# Patient Record
Sex: Female | Born: 1950
Health system: Southern US, Community
[De-identification: ages and names within clinical notes are randomized; demographics above are authoritative.]

## PROBLEM LIST (undated history)

## (undated) DIAGNOSIS — G4730 Sleep apnea, unspecified: Secondary | ICD-10-CM

## (undated) DIAGNOSIS — J383 Other diseases of vocal cords: Secondary | ICD-10-CM

## (undated) DIAGNOSIS — Z973 Presence of spectacles and contact lenses: Secondary | ICD-10-CM

## (undated) DIAGNOSIS — M1712 Unilateral primary osteoarthritis, left knee: Secondary | ICD-10-CM

## (undated) DIAGNOSIS — F329 Major depressive disorder, single episode, unspecified: Secondary | ICD-10-CM

## (undated) DIAGNOSIS — I428 Other cardiomyopathies: Secondary | ICD-10-CM

## (undated) DIAGNOSIS — K219 Gastro-esophageal reflux disease without esophagitis: Secondary | ICD-10-CM

## (undated) DIAGNOSIS — E785 Hyperlipidemia, unspecified: Secondary | ICD-10-CM

## (undated) DIAGNOSIS — T8859XA Other complications of anesthesia, initial encounter: Secondary | ICD-10-CM

## (undated) DIAGNOSIS — F32A Depression, unspecified: Secondary | ICD-10-CM

## (undated) DIAGNOSIS — I10 Essential (primary) hypertension: Secondary | ICD-10-CM

## (undated) DIAGNOSIS — Z8719 Personal history of other diseases of the digestive system: Secondary | ICD-10-CM

## (undated) DIAGNOSIS — I469 Cardiac arrest, cause unspecified: Secondary | ICD-10-CM

## (undated) DIAGNOSIS — Z87442 Personal history of urinary calculi: Secondary | ICD-10-CM

## (undated) DIAGNOSIS — M79609 Pain in unspecified limb: Secondary | ICD-10-CM

## (undated) DIAGNOSIS — D649 Anemia, unspecified: Secondary | ICD-10-CM

## (undated) DIAGNOSIS — Z95810 Presence of automatic (implantable) cardiac defibrillator: Secondary | ICD-10-CM

## (undated) DIAGNOSIS — I499 Cardiac arrhythmia, unspecified: Secondary | ICD-10-CM

## (undated) DIAGNOSIS — M1990 Unspecified osteoarthritis, unspecified site: Secondary | ICD-10-CM

## (undated) DIAGNOSIS — F419 Anxiety disorder, unspecified: Secondary | ICD-10-CM

## (undated) DIAGNOSIS — I509 Heart failure, unspecified: Secondary | ICD-10-CM

## (undated) DIAGNOSIS — I4891 Unspecified atrial fibrillation: Secondary | ICD-10-CM

## (undated) DIAGNOSIS — M1711 Unilateral primary osteoarthritis, right knee: Secondary | ICD-10-CM

## (undated) DIAGNOSIS — T4145XA Adverse effect of unspecified anesthetic, initial encounter: Secondary | ICD-10-CM

## (undated) DIAGNOSIS — J45909 Unspecified asthma, uncomplicated: Secondary | ICD-10-CM

## (undated) HISTORY — DX: Anemia, unspecified: D64.9

## (undated) HISTORY — PX: KNEE ARTHROSCOPY: SUR90

## (undated) HISTORY — DX: Other cardiomyopathies: I42.8

## (undated) HISTORY — DX: Heart failure, unspecified: I50.9

## (undated) HISTORY — DX: Presence of automatic (implantable) cardiac defibrillator: Z95.810

## (undated) HISTORY — DX: Cardiac arrest, cause unspecified: I46.9

## (undated) HISTORY — DX: Sleep apnea, unspecified: G47.30

## (undated) HISTORY — PX: CHOLECYSTECTOMY: SHX55

## (undated) HISTORY — PX: COLONOSCOPY: SHX174

## (undated) HISTORY — PX: OTHER SURGICAL HISTORY: SHX169

## (undated) HISTORY — DX: Hyperlipidemia, unspecified: E78.5

## (undated) HISTORY — DX: Other diseases of vocal cords: J38.3

## (undated) HISTORY — PX: TUBAL LIGATION: SHX77

## (undated) HISTORY — DX: Unspecified atrial fibrillation: I48.91

## (undated) HISTORY — DX: Gastro-esophageal reflux disease without esophagitis: K21.9

## (undated) HISTORY — DX: Anxiety disorder, unspecified: F41.9

## (undated) HISTORY — DX: Unspecified osteoarthritis, unspecified site: M19.90

## (undated) HISTORY — DX: Major depressive disorder, single episode, unspecified: F32.9

## (undated) HISTORY — DX: Unspecified asthma, uncomplicated: J45.909

## (undated) HISTORY — DX: Pain in unspecified limb: M79.609

## (undated) HISTORY — PX: BREAST LUMPECTOMY: SHX2

## (undated) HISTORY — PX: HAMMER TOE SURGERY: SHX385

## (undated) HISTORY — DX: Depression, unspecified: F32.A

---

## 1997-04-13 HISTORY — PX: BREAST EXCISIONAL BIOPSY: SUR124

## 1997-07-25 ENCOUNTER — Encounter: Admission: RE | Admit: 1997-07-25 | Discharge: 1997-07-25 | Payer: Self-pay | Admitting: Family Medicine

## 1997-08-08 ENCOUNTER — Encounter: Admission: RE | Admit: 1997-08-08 | Discharge: 1997-08-08 | Payer: Self-pay | Admitting: Family Medicine

## 1997-08-10 ENCOUNTER — Inpatient Hospital Stay (HOSPITAL_COMMUNITY): Admission: AD | Admit: 1997-08-10 | Discharge: 1997-08-13 | Payer: Self-pay | Admitting: Pulmonary Disease

## 1997-08-15 ENCOUNTER — Encounter: Admission: RE | Admit: 1997-08-15 | Discharge: 1997-08-15 | Payer: Self-pay | Admitting: Family Medicine

## 1997-08-22 ENCOUNTER — Encounter: Admission: RE | Admit: 1997-08-22 | Discharge: 1997-08-22 | Payer: Self-pay | Admitting: Family Medicine

## 1997-09-05 ENCOUNTER — Encounter: Admission: RE | Admit: 1997-09-05 | Discharge: 1997-09-05 | Payer: Self-pay | Admitting: Family Medicine

## 1997-09-19 ENCOUNTER — Encounter: Admission: RE | Admit: 1997-09-19 | Discharge: 1997-09-19 | Payer: Self-pay | Admitting: Family Medicine

## 1997-10-10 ENCOUNTER — Encounter: Admission: RE | Admit: 1997-10-10 | Discharge: 1997-10-10 | Payer: Self-pay | Admitting: Family Medicine

## 1997-10-25 ENCOUNTER — Encounter: Admission: RE | Admit: 1997-10-25 | Discharge: 1997-10-25 | Payer: Self-pay | Admitting: Family Medicine

## 1997-11-05 ENCOUNTER — Encounter: Admission: RE | Admit: 1997-11-05 | Discharge: 1997-11-05 | Payer: Self-pay | Admitting: Family Medicine

## 1997-12-06 ENCOUNTER — Encounter: Admission: RE | Admit: 1997-12-06 | Discharge: 1997-12-06 | Payer: Self-pay | Admitting: Family Medicine

## 1997-12-12 ENCOUNTER — Other Ambulatory Visit: Admission: RE | Admit: 1997-12-12 | Discharge: 1997-12-12 | Payer: Self-pay | Admitting: Family Medicine

## 1997-12-12 ENCOUNTER — Encounter: Admission: RE | Admit: 1997-12-12 | Discharge: 1997-12-12 | Payer: Self-pay | Admitting: Family Medicine

## 1997-12-18 ENCOUNTER — Encounter: Admission: RE | Admit: 1997-12-18 | Discharge: 1997-12-18 | Payer: Self-pay | Admitting: Sports Medicine

## 1998-02-25 ENCOUNTER — Encounter: Admission: RE | Admit: 1998-02-25 | Discharge: 1998-02-25 | Payer: Self-pay | Admitting: Family Medicine

## 1998-05-15 ENCOUNTER — Encounter: Admission: RE | Admit: 1998-05-15 | Discharge: 1998-05-15 | Payer: Self-pay | Admitting: Family Medicine

## 1998-05-19 ENCOUNTER — Emergency Department (HOSPITAL_COMMUNITY): Admission: EM | Admit: 1998-05-19 | Discharge: 1998-05-19 | Payer: Self-pay | Admitting: Emergency Medicine

## 1998-06-06 ENCOUNTER — Observation Stay (HOSPITAL_COMMUNITY): Admission: AD | Admit: 1998-06-06 | Discharge: 1998-06-07 | Payer: Self-pay | Admitting: Family Medicine

## 1998-06-06 ENCOUNTER — Encounter: Payer: Self-pay | Admitting: Family Medicine

## 1998-06-14 ENCOUNTER — Encounter: Admission: RE | Admit: 1998-06-14 | Discharge: 1998-06-14 | Payer: Self-pay | Admitting: Family Medicine

## 1998-07-01 ENCOUNTER — Encounter: Admission: RE | Admit: 1998-07-01 | Discharge: 1998-07-01 | Payer: Self-pay | Admitting: Sports Medicine

## 1998-07-17 ENCOUNTER — Encounter: Admission: RE | Admit: 1998-07-17 | Discharge: 1998-07-17 | Payer: Self-pay | Admitting: Family Medicine

## 1998-09-14 ENCOUNTER — Emergency Department (HOSPITAL_COMMUNITY): Admission: EM | Admit: 1998-09-14 | Discharge: 1998-09-14 | Payer: Self-pay | Admitting: Emergency Medicine

## 1998-09-26 ENCOUNTER — Encounter: Payer: Self-pay | Admitting: Infectious Diseases

## 1998-09-26 ENCOUNTER — Ambulatory Visit (HOSPITAL_COMMUNITY): Admission: RE | Admit: 1998-09-26 | Discharge: 1998-09-26 | Payer: Self-pay | Admitting: Infectious Diseases

## 1998-11-08 ENCOUNTER — Encounter: Admission: RE | Admit: 1998-11-08 | Discharge: 1998-11-08 | Payer: Self-pay | Admitting: Family Medicine

## 1998-11-22 ENCOUNTER — Encounter: Admission: RE | Admit: 1998-11-22 | Discharge: 1998-11-22 | Payer: Self-pay | Admitting: Family Medicine

## 1998-12-10 ENCOUNTER — Encounter: Admission: RE | Admit: 1998-12-10 | Discharge: 1998-12-10 | Payer: Self-pay | Admitting: Sports Medicine

## 1998-12-17 ENCOUNTER — Encounter: Admission: RE | Admit: 1998-12-17 | Discharge: 1998-12-17 | Payer: Self-pay | Admitting: Sports Medicine

## 1998-12-30 ENCOUNTER — Encounter: Admission: RE | Admit: 1998-12-30 | Discharge: 1998-12-30 | Payer: Self-pay | Admitting: Family Medicine

## 1999-01-02 ENCOUNTER — Encounter: Admission: RE | Admit: 1999-01-02 | Discharge: 1999-01-02 | Payer: Self-pay | Admitting: Family Medicine

## 1999-01-06 ENCOUNTER — Encounter: Admission: RE | Admit: 1999-01-06 | Discharge: 1999-01-06 | Payer: Self-pay | Admitting: Family Medicine

## 1999-01-24 ENCOUNTER — Encounter: Admission: RE | Admit: 1999-01-24 | Discharge: 1999-01-24 | Payer: Self-pay | Admitting: Family Medicine

## 1999-02-12 ENCOUNTER — Encounter: Admission: RE | Admit: 1999-02-12 | Discharge: 1999-02-12 | Payer: Self-pay | Admitting: Family Medicine

## 1999-03-03 ENCOUNTER — Ambulatory Visit (HOSPITAL_COMMUNITY): Admission: RE | Admit: 1999-03-03 | Discharge: 1999-03-03 | Payer: Self-pay | Admitting: Family Medicine

## 1999-03-28 ENCOUNTER — Encounter: Admission: RE | Admit: 1999-03-28 | Discharge: 1999-03-28 | Payer: Self-pay | Admitting: Family Medicine

## 1999-06-02 ENCOUNTER — Encounter: Admission: RE | Admit: 1999-06-02 | Discharge: 1999-06-02 | Payer: Self-pay | Admitting: Family Medicine

## 1999-06-10 ENCOUNTER — Encounter: Admission: RE | Admit: 1999-06-10 | Discharge: 1999-06-10 | Payer: Self-pay | Admitting: Sports Medicine

## 1999-06-16 ENCOUNTER — Encounter: Admission: RE | Admit: 1999-06-16 | Discharge: 1999-06-16 | Payer: Self-pay | Admitting: Family Medicine

## 1999-07-07 ENCOUNTER — Encounter: Admission: RE | Admit: 1999-07-07 | Discharge: 1999-07-07 | Payer: Self-pay | Admitting: Family Medicine

## 1999-08-14 ENCOUNTER — Encounter: Admission: RE | Admit: 1999-08-14 | Discharge: 1999-08-14 | Payer: Self-pay | Admitting: Family Medicine

## 1999-11-13 ENCOUNTER — Encounter: Admission: RE | Admit: 1999-11-13 | Discharge: 1999-11-13 | Payer: Self-pay | Admitting: Family Medicine

## 2000-01-16 ENCOUNTER — Encounter: Admission: RE | Admit: 2000-01-16 | Discharge: 2000-01-16 | Payer: Self-pay | Admitting: Family Medicine

## 2000-01-27 ENCOUNTER — Encounter: Admission: RE | Admit: 2000-01-27 | Discharge: 2000-01-27 | Payer: Self-pay | Admitting: Family Medicine

## 2000-02-05 ENCOUNTER — Encounter: Admission: RE | Admit: 2000-02-05 | Discharge: 2000-02-05 | Payer: Self-pay | Admitting: Family Medicine

## 2000-02-19 ENCOUNTER — Encounter: Admission: RE | Admit: 2000-02-19 | Discharge: 2000-02-19 | Payer: Self-pay | Admitting: Family Medicine

## 2000-03-26 ENCOUNTER — Encounter: Admission: RE | Admit: 2000-03-26 | Discharge: 2000-03-26 | Payer: Self-pay | Admitting: Family Medicine

## 2000-05-18 ENCOUNTER — Encounter: Admission: RE | Admit: 2000-05-18 | Discharge: 2000-05-18 | Payer: Self-pay | Admitting: Sports Medicine

## 2000-06-29 ENCOUNTER — Encounter: Admission: RE | Admit: 2000-06-29 | Discharge: 2000-06-29 | Payer: Self-pay | Admitting: Family Medicine

## 2000-09-04 ENCOUNTER — Emergency Department (HOSPITAL_COMMUNITY): Admission: EM | Admit: 2000-09-04 | Discharge: 2000-09-04 | Payer: Self-pay | Admitting: Emergency Medicine

## 2001-01-21 ENCOUNTER — Ambulatory Visit (HOSPITAL_COMMUNITY): Admission: RE | Admit: 2001-01-21 | Discharge: 2001-01-21 | Payer: Self-pay | Admitting: Family Medicine

## 2001-01-21 ENCOUNTER — Encounter: Admission: RE | Admit: 2001-01-21 | Discharge: 2001-01-21 | Payer: Self-pay | Admitting: Family Medicine

## 2001-01-26 ENCOUNTER — Encounter: Payer: Self-pay | Admitting: Sports Medicine

## 2001-01-26 ENCOUNTER — Encounter: Admission: RE | Admit: 2001-01-26 | Discharge: 2001-01-26 | Payer: Self-pay | Admitting: Sports Medicine

## 2001-02-08 ENCOUNTER — Encounter: Admission: RE | Admit: 2001-02-08 | Discharge: 2001-02-08 | Payer: Self-pay | Admitting: Family Medicine

## 2001-02-16 ENCOUNTER — Encounter: Admission: RE | Admit: 2001-02-16 | Discharge: 2001-05-17 | Payer: Self-pay | Admitting: *Deleted

## 2001-02-17 ENCOUNTER — Encounter: Admission: RE | Admit: 2001-02-17 | Discharge: 2001-02-17 | Payer: Self-pay | Admitting: Family Medicine

## 2001-03-28 ENCOUNTER — Encounter: Admission: RE | Admit: 2001-03-28 | Discharge: 2001-03-28 | Payer: Self-pay | Admitting: Family Medicine

## 2001-03-31 ENCOUNTER — Encounter: Admission: RE | Admit: 2001-03-31 | Discharge: 2001-06-29 | Payer: Self-pay | Admitting: Internal Medicine

## 2001-03-31 ENCOUNTER — Encounter: Admission: RE | Admit: 2001-03-31 | Discharge: 2001-03-31 | Payer: Self-pay | Admitting: Family Medicine

## 2001-04-14 ENCOUNTER — Encounter: Admission: RE | Admit: 2001-04-14 | Discharge: 2001-07-13 | Payer: Self-pay | Admitting: Internal Medicine

## 2001-05-20 ENCOUNTER — Encounter: Admission: RE | Admit: 2001-05-20 | Discharge: 2001-05-20 | Payer: Self-pay | Admitting: Family Medicine

## 2001-07-01 ENCOUNTER — Encounter: Admission: RE | Admit: 2001-07-01 | Discharge: 2001-07-01 | Payer: Self-pay | Admitting: Family Medicine

## 2001-08-01 ENCOUNTER — Encounter: Admission: RE | Admit: 2001-08-01 | Discharge: 2001-08-01 | Payer: Self-pay | Admitting: Family Medicine

## 2001-09-12 ENCOUNTER — Encounter (HOSPITAL_BASED_OUTPATIENT_CLINIC_OR_DEPARTMENT_OTHER): Admission: RE | Admit: 2001-09-12 | Discharge: 2001-12-11 | Payer: Self-pay | Admitting: Internal Medicine

## 2001-09-25 ENCOUNTER — Emergency Department (HOSPITAL_COMMUNITY): Admission: EM | Admit: 2001-09-25 | Discharge: 2001-09-25 | Payer: Self-pay | Admitting: Emergency Medicine

## 2001-09-27 ENCOUNTER — Encounter: Payer: Self-pay | Admitting: Critical Care Medicine

## 2001-09-27 ENCOUNTER — Inpatient Hospital Stay (HOSPITAL_COMMUNITY): Admission: EM | Admit: 2001-09-27 | Discharge: 2001-09-29 | Payer: Self-pay | Admitting: Critical Care Medicine

## 2001-09-28 ENCOUNTER — Encounter: Payer: Self-pay | Admitting: Critical Care Medicine

## 2001-12-12 ENCOUNTER — Encounter (HOSPITAL_BASED_OUTPATIENT_CLINIC_OR_DEPARTMENT_OTHER): Admission: RE | Admit: 2001-12-12 | Discharge: 2001-12-15 | Payer: Self-pay | Admitting: Internal Medicine

## 2001-12-21 ENCOUNTER — Encounter: Admission: RE | Admit: 2001-12-21 | Discharge: 2001-12-21 | Payer: Self-pay | Admitting: Family Medicine

## 2002-01-12 ENCOUNTER — Encounter: Admission: RE | Admit: 2002-01-12 | Discharge: 2002-01-12 | Payer: Self-pay | Admitting: Family Medicine

## 2002-01-14 ENCOUNTER — Encounter: Payer: Self-pay | Admitting: Emergency Medicine

## 2002-01-14 ENCOUNTER — Inpatient Hospital Stay (HOSPITAL_COMMUNITY): Admission: EM | Admit: 2002-01-14 | Discharge: 2002-01-18 | Payer: Self-pay | Admitting: *Deleted

## 2002-01-16 ENCOUNTER — Encounter: Payer: Self-pay | Admitting: Family Medicine

## 2002-01-30 ENCOUNTER — Encounter: Payer: Self-pay | Admitting: Family Medicine

## 2002-01-30 ENCOUNTER — Encounter: Admission: RE | Admit: 2002-01-30 | Discharge: 2002-01-30 | Payer: Self-pay | Admitting: *Deleted

## 2002-02-08 ENCOUNTER — Encounter: Admission: RE | Admit: 2002-02-08 | Discharge: 2002-02-08 | Payer: Self-pay | Admitting: Sports Medicine

## 2002-03-20 ENCOUNTER — Encounter (HOSPITAL_BASED_OUTPATIENT_CLINIC_OR_DEPARTMENT_OTHER): Admission: RE | Admit: 2002-03-20 | Discharge: 2002-06-18 | Payer: Self-pay | Admitting: Internal Medicine

## 2002-03-22 ENCOUNTER — Encounter: Admission: RE | Admit: 2002-03-22 | Discharge: 2002-03-22 | Payer: Self-pay | Admitting: Family Medicine

## 2002-04-21 ENCOUNTER — Encounter: Admission: RE | Admit: 2002-04-21 | Discharge: 2002-04-21 | Payer: Self-pay | Admitting: Family Medicine

## 2002-05-18 ENCOUNTER — Inpatient Hospital Stay (HOSPITAL_COMMUNITY): Admission: EM | Admit: 2002-05-18 | Discharge: 2002-05-26 | Payer: Self-pay

## 2002-05-18 ENCOUNTER — Encounter: Payer: Self-pay | Admitting: Cardiology

## 2002-05-19 ENCOUNTER — Encounter (INDEPENDENT_AMBULATORY_CARE_PROVIDER_SITE_OTHER): Payer: Self-pay | Admitting: *Deleted

## 2002-06-12 ENCOUNTER — Encounter: Admission: RE | Admit: 2002-06-12 | Discharge: 2002-06-12 | Payer: Self-pay | Admitting: Sports Medicine

## 2002-06-26 ENCOUNTER — Encounter (HOSPITAL_BASED_OUTPATIENT_CLINIC_OR_DEPARTMENT_OTHER): Admission: RE | Admit: 2002-06-26 | Discharge: 2002-09-24 | Payer: Self-pay | Admitting: Internal Medicine

## 2002-07-13 ENCOUNTER — Encounter: Admission: RE | Admit: 2002-07-13 | Discharge: 2002-07-13 | Payer: Self-pay | Admitting: Family Medicine

## 2002-08-23 ENCOUNTER — Emergency Department (HOSPITAL_COMMUNITY): Admission: EM | Admit: 2002-08-23 | Discharge: 2002-08-23 | Payer: Self-pay | Admitting: Emergency Medicine

## 2002-08-28 ENCOUNTER — Encounter: Admission: RE | Admit: 2002-08-28 | Discharge: 2002-08-28 | Payer: Self-pay | Admitting: Family Medicine

## 2002-09-08 ENCOUNTER — Ambulatory Visit (HOSPITAL_COMMUNITY): Admission: RE | Admit: 2002-09-08 | Discharge: 2002-09-08 | Payer: Self-pay | Admitting: Otolaryngology

## 2002-09-08 ENCOUNTER — Encounter: Payer: Self-pay | Admitting: Otolaryngology

## 2002-09-27 ENCOUNTER — Encounter: Admission: RE | Admit: 2002-09-27 | Discharge: 2002-09-27 | Payer: Self-pay | Admitting: Family Medicine

## 2002-10-05 ENCOUNTER — Encounter: Admission: RE | Admit: 2002-10-05 | Discharge: 2002-10-05 | Payer: Self-pay | Admitting: Family Medicine

## 2002-10-20 ENCOUNTER — Encounter (HOSPITAL_BASED_OUTPATIENT_CLINIC_OR_DEPARTMENT_OTHER): Admission: RE | Admit: 2002-10-20 | Discharge: 2002-10-25 | Payer: Self-pay | Admitting: Internal Medicine

## 2002-12-11 ENCOUNTER — Encounter: Admission: RE | Admit: 2002-12-11 | Discharge: 2002-12-11 | Payer: Self-pay | Admitting: Sports Medicine

## 2003-01-11 ENCOUNTER — Encounter: Admission: RE | Admit: 2003-01-11 | Discharge: 2003-01-11 | Payer: Self-pay | Admitting: Family Medicine

## 2003-01-25 ENCOUNTER — Encounter (HOSPITAL_BASED_OUTPATIENT_CLINIC_OR_DEPARTMENT_OTHER): Admission: RE | Admit: 2003-01-25 | Discharge: 2003-04-25 | Payer: Self-pay | Admitting: Internal Medicine

## 2003-02-06 ENCOUNTER — Encounter: Admission: RE | Admit: 2003-02-06 | Discharge: 2003-02-06 | Payer: Self-pay | Admitting: Sports Medicine

## 2003-02-18 ENCOUNTER — Emergency Department (HOSPITAL_COMMUNITY): Admission: EM | Admit: 2003-02-18 | Discharge: 2003-02-18 | Payer: Self-pay | Admitting: Emergency Medicine

## 2003-02-28 ENCOUNTER — Encounter: Admission: RE | Admit: 2003-02-28 | Discharge: 2003-02-28 | Payer: Self-pay | Admitting: Family Medicine

## 2003-03-07 ENCOUNTER — Encounter: Admission: RE | Admit: 2003-03-07 | Discharge: 2003-03-07 | Payer: Self-pay | Admitting: Sports Medicine

## 2003-04-20 ENCOUNTER — Encounter: Admission: RE | Admit: 2003-04-20 | Discharge: 2003-04-20 | Payer: Self-pay | Admitting: Family Medicine

## 2003-04-26 ENCOUNTER — Encounter (HOSPITAL_BASED_OUTPATIENT_CLINIC_OR_DEPARTMENT_OTHER): Admission: RE | Admit: 2003-04-26 | Discharge: 2003-06-18 | Payer: Self-pay | Admitting: Internal Medicine

## 2003-04-30 ENCOUNTER — Encounter: Admission: RE | Admit: 2003-04-30 | Discharge: 2003-04-30 | Payer: Self-pay | Admitting: Family Medicine

## 2003-05-01 ENCOUNTER — Encounter: Admission: RE | Admit: 2003-05-01 | Discharge: 2003-05-01 | Payer: Self-pay | Admitting: Family Medicine

## 2003-07-23 ENCOUNTER — Encounter: Admission: RE | Admit: 2003-07-23 | Discharge: 2003-07-23 | Payer: Self-pay | Admitting: Family Medicine

## 2003-08-01 ENCOUNTER — Encounter (HOSPITAL_BASED_OUTPATIENT_CLINIC_OR_DEPARTMENT_OTHER): Admission: RE | Admit: 2003-08-01 | Discharge: 2003-08-21 | Payer: Self-pay | Admitting: Internal Medicine

## 2003-10-16 ENCOUNTER — Encounter (HOSPITAL_BASED_OUTPATIENT_CLINIC_OR_DEPARTMENT_OTHER): Admission: RE | Admit: 2003-10-16 | Discharge: 2004-01-14 | Payer: Self-pay | Admitting: Internal Medicine

## 2003-10-31 ENCOUNTER — Encounter: Admission: RE | Admit: 2003-10-31 | Discharge: 2003-10-31 | Payer: Self-pay | Admitting: Family Medicine

## 2003-12-10 ENCOUNTER — Encounter: Admission: RE | Admit: 2003-12-10 | Discharge: 2003-12-10 | Payer: Self-pay | Admitting: Sports Medicine

## 2004-02-19 ENCOUNTER — Encounter (HOSPITAL_BASED_OUTPATIENT_CLINIC_OR_DEPARTMENT_OTHER): Admission: RE | Admit: 2004-02-19 | Discharge: 2004-04-04 | Payer: Self-pay | Admitting: Internal Medicine

## 2004-02-20 ENCOUNTER — Ambulatory Visit: Payer: Self-pay | Admitting: Family Medicine

## 2004-03-11 ENCOUNTER — Encounter: Admission: RE | Admit: 2004-03-11 | Discharge: 2004-03-11 | Payer: Self-pay | Admitting: Sports Medicine

## 2004-03-20 ENCOUNTER — Ambulatory Visit: Payer: Self-pay | Admitting: Critical Care Medicine

## 2004-04-16 ENCOUNTER — Ambulatory Visit: Payer: Self-pay | Admitting: Family Medicine

## 2004-04-24 ENCOUNTER — Ambulatory Visit: Payer: Self-pay | Admitting: Family Medicine

## 2004-04-28 ENCOUNTER — Ambulatory Visit: Payer: Self-pay | Admitting: Critical Care Medicine

## 2004-05-27 ENCOUNTER — Encounter (HOSPITAL_BASED_OUTPATIENT_CLINIC_OR_DEPARTMENT_OTHER): Admission: RE | Admit: 2004-05-27 | Discharge: 2004-06-11 | Payer: Self-pay | Admitting: Internal Medicine

## 2004-06-30 ENCOUNTER — Ambulatory Visit (HOSPITAL_BASED_OUTPATIENT_CLINIC_OR_DEPARTMENT_OTHER): Admission: RE | Admit: 2004-06-30 | Discharge: 2004-06-30 | Payer: Self-pay | Admitting: Critical Care Medicine

## 2004-07-07 ENCOUNTER — Ambulatory Visit: Payer: Self-pay | Admitting: Pulmonary Disease

## 2004-07-30 ENCOUNTER — Ambulatory Visit: Payer: Self-pay | Admitting: Critical Care Medicine

## 2004-08-27 ENCOUNTER — Ambulatory Visit (HOSPITAL_BASED_OUTPATIENT_CLINIC_OR_DEPARTMENT_OTHER): Admission: RE | Admit: 2004-08-27 | Discharge: 2004-08-27 | Payer: Self-pay | Admitting: Critical Care Medicine

## 2004-09-03 ENCOUNTER — Encounter (HOSPITAL_BASED_OUTPATIENT_CLINIC_OR_DEPARTMENT_OTHER): Admission: RE | Admit: 2004-09-03 | Discharge: 2004-11-18 | Payer: Self-pay | Admitting: Surgery

## 2004-09-10 ENCOUNTER — Ambulatory Visit: Payer: Self-pay | Admitting: Pulmonary Disease

## 2004-09-24 ENCOUNTER — Ambulatory Visit: Payer: Self-pay | Admitting: Family Medicine

## 2004-09-24 ENCOUNTER — Encounter: Admission: RE | Admit: 2004-09-24 | Discharge: 2004-09-24 | Payer: Self-pay | Admitting: Sports Medicine

## 2004-09-29 ENCOUNTER — Ambulatory Visit: Payer: Self-pay | Admitting: Critical Care Medicine

## 2004-10-29 ENCOUNTER — Ambulatory Visit: Payer: Self-pay | Admitting: Pulmonary Disease

## 2004-11-07 ENCOUNTER — Ambulatory Visit: Payer: Self-pay | Admitting: Critical Care Medicine

## 2005-01-06 ENCOUNTER — Ambulatory Visit: Payer: Self-pay | Admitting: Critical Care Medicine

## 2005-01-26 ENCOUNTER — Ambulatory Visit: Payer: Self-pay | Admitting: Family Medicine

## 2005-02-12 ENCOUNTER — Ambulatory Visit: Payer: Self-pay | Admitting: Family Medicine

## 2005-03-19 ENCOUNTER — Encounter: Admission: RE | Admit: 2005-03-19 | Discharge: 2005-03-19 | Payer: Self-pay | Admitting: Sports Medicine

## 2005-05-06 ENCOUNTER — Ambulatory Visit: Payer: Self-pay | Admitting: Critical Care Medicine

## 2005-05-18 ENCOUNTER — Ambulatory Visit: Payer: Self-pay | Admitting: Family Medicine

## 2005-05-19 ENCOUNTER — Ambulatory Visit: Payer: Self-pay | Admitting: Sports Medicine

## 2005-05-29 ENCOUNTER — Ambulatory Visit: Payer: Self-pay | Admitting: Family Medicine

## 2005-06-25 ENCOUNTER — Ambulatory Visit: Payer: Self-pay | Admitting: Family Medicine

## 2005-08-19 ENCOUNTER — Inpatient Hospital Stay (HOSPITAL_COMMUNITY): Admission: EM | Admit: 2005-08-19 | Discharge: 2005-08-25 | Payer: Self-pay | Admitting: *Deleted

## 2005-08-19 ENCOUNTER — Ambulatory Visit: Payer: Self-pay | Admitting: Internal Medicine

## 2005-09-04 ENCOUNTER — Ambulatory Visit: Payer: Self-pay | Admitting: Critical Care Medicine

## 2005-09-22 ENCOUNTER — Ambulatory Visit: Payer: Self-pay | Admitting: Sports Medicine

## 2005-11-02 ENCOUNTER — Emergency Department (HOSPITAL_COMMUNITY): Admission: EM | Admit: 2005-11-02 | Discharge: 2005-11-02 | Payer: Self-pay | Admitting: Certified Registered"

## 2005-11-06 ENCOUNTER — Ambulatory Visit: Payer: Self-pay | Admitting: Sports Medicine

## 2005-12-24 ENCOUNTER — Ambulatory Visit: Payer: Self-pay | Admitting: Critical Care Medicine

## 2006-01-04 ENCOUNTER — Ambulatory Visit: Payer: Self-pay | Admitting: Family Medicine

## 2006-01-08 ENCOUNTER — Encounter: Admission: RE | Admit: 2006-01-08 | Discharge: 2006-01-08 | Payer: Self-pay | Admitting: Sports Medicine

## 2006-01-12 ENCOUNTER — Ambulatory Visit: Payer: Self-pay | Admitting: Sports Medicine

## 2006-01-19 ENCOUNTER — Ambulatory Visit: Payer: Self-pay | Admitting: Family Medicine

## 2006-03-22 ENCOUNTER — Encounter: Admission: RE | Admit: 2006-03-22 | Discharge: 2006-03-22 | Payer: Self-pay | Admitting: Family Medicine

## 2006-04-13 ENCOUNTER — Encounter (INDEPENDENT_AMBULATORY_CARE_PROVIDER_SITE_OTHER): Payer: Self-pay | Admitting: *Deleted

## 2006-04-13 LAB — CONVERTED CEMR LAB

## 2006-05-05 ENCOUNTER — Ambulatory Visit: Payer: Self-pay | Admitting: Family Medicine

## 2006-06-02 ENCOUNTER — Ambulatory Visit: Payer: Self-pay | Admitting: Family Medicine

## 2006-06-02 ENCOUNTER — Encounter (INDEPENDENT_AMBULATORY_CARE_PROVIDER_SITE_OTHER): Payer: Self-pay | Admitting: Family Medicine

## 2006-06-02 LAB — CONVERTED CEMR LAB
Cholesterol: 174 mg/dL (ref 0–200)
Total CHOL/HDL Ratio: 3.6

## 2006-06-10 DIAGNOSIS — K219 Gastro-esophageal reflux disease without esophagitis: Secondary | ICD-10-CM

## 2006-06-10 DIAGNOSIS — I4891 Unspecified atrial fibrillation: Secondary | ICD-10-CM | POA: Insufficient documentation

## 2006-06-10 DIAGNOSIS — F339 Major depressive disorder, recurrent, unspecified: Secondary | ICD-10-CM | POA: Insufficient documentation

## 2006-06-10 DIAGNOSIS — E7800 Pure hypercholesterolemia, unspecified: Secondary | ICD-10-CM

## 2006-06-10 DIAGNOSIS — I480 Paroxysmal atrial fibrillation: Secondary | ICD-10-CM | POA: Insufficient documentation

## 2006-06-10 DIAGNOSIS — J45909 Unspecified asthma, uncomplicated: Secondary | ICD-10-CM | POA: Insufficient documentation

## 2006-06-10 DIAGNOSIS — I10 Essential (primary) hypertension: Secondary | ICD-10-CM | POA: Insufficient documentation

## 2006-06-10 DIAGNOSIS — Z9989 Dependence on other enabling machines and devices: Secondary | ICD-10-CM

## 2006-06-10 DIAGNOSIS — E1165 Type 2 diabetes mellitus with hyperglycemia: Secondary | ICD-10-CM

## 2006-06-10 DIAGNOSIS — G4733 Obstructive sleep apnea (adult) (pediatric): Secondary | ICD-10-CM

## 2006-06-10 DIAGNOSIS — E1140 Type 2 diabetes mellitus with diabetic neuropathy, unspecified: Secondary | ICD-10-CM | POA: Insufficient documentation

## 2006-06-10 HISTORY — DX: Obstructive sleep apnea (adult) (pediatric): G47.33

## 2006-06-10 HISTORY — DX: Type 2 diabetes mellitus with diabetic neuropathy, unspecified: E11.40

## 2006-06-10 HISTORY — DX: Pure hypercholesterolemia, unspecified: E78.00

## 2006-06-10 HISTORY — DX: Gastro-esophageal reflux disease without esophagitis: K21.9

## 2006-06-11 ENCOUNTER — Encounter (INDEPENDENT_AMBULATORY_CARE_PROVIDER_SITE_OTHER): Payer: Self-pay | Admitting: *Deleted

## 2006-06-18 ENCOUNTER — Encounter (INDEPENDENT_AMBULATORY_CARE_PROVIDER_SITE_OTHER): Payer: Self-pay | Admitting: Family Medicine

## 2006-06-28 ENCOUNTER — Ambulatory Visit: Payer: Self-pay | Admitting: Critical Care Medicine

## 2006-06-30 ENCOUNTER — Encounter (INDEPENDENT_AMBULATORY_CARE_PROVIDER_SITE_OTHER): Payer: Self-pay | Admitting: Family Medicine

## 2006-07-04 ENCOUNTER — Emergency Department (HOSPITAL_COMMUNITY): Admission: EM | Admit: 2006-07-04 | Discharge: 2006-07-04 | Payer: Self-pay | Admitting: Emergency Medicine

## 2006-07-06 ENCOUNTER — Ambulatory Visit: Payer: Self-pay | Admitting: Critical Care Medicine

## 2006-08-04 ENCOUNTER — Ambulatory Visit: Payer: Self-pay | Admitting: Critical Care Medicine

## 2006-09-02 ENCOUNTER — Encounter (INDEPENDENT_AMBULATORY_CARE_PROVIDER_SITE_OTHER): Payer: Self-pay | Admitting: Family Medicine

## 2006-09-17 ENCOUNTER — Ambulatory Visit: Payer: Self-pay | Admitting: Critical Care Medicine

## 2006-10-13 ENCOUNTER — Ambulatory Visit: Payer: Self-pay | Admitting: Family Medicine

## 2006-10-13 DIAGNOSIS — J383 Other diseases of vocal cords: Secondary | ICD-10-CM | POA: Insufficient documentation

## 2006-10-13 LAB — CONVERTED CEMR LAB: Hgb A1c MFr Bld: 7.2 %

## 2006-12-28 ENCOUNTER — Encounter (INDEPENDENT_AMBULATORY_CARE_PROVIDER_SITE_OTHER): Payer: Self-pay | Admitting: Family Medicine

## 2007-01-03 ENCOUNTER — Ambulatory Visit: Payer: Self-pay | Admitting: Critical Care Medicine

## 2007-03-15 ENCOUNTER — Ambulatory Visit: Payer: Self-pay | Admitting: Critical Care Medicine

## 2007-03-25 ENCOUNTER — Encounter: Admission: RE | Admit: 2007-03-25 | Discharge: 2007-03-25 | Payer: Self-pay | Admitting: Sports Medicine

## 2007-05-05 ENCOUNTER — Telehealth (INDEPENDENT_AMBULATORY_CARE_PROVIDER_SITE_OTHER): Payer: Self-pay | Admitting: *Deleted

## 2007-05-09 ENCOUNTER — Ambulatory Visit: Payer: Self-pay | Admitting: Critical Care Medicine

## 2007-05-13 ENCOUNTER — Encounter (INDEPENDENT_AMBULATORY_CARE_PROVIDER_SITE_OTHER): Payer: Self-pay | Admitting: Family Medicine

## 2007-05-13 ENCOUNTER — Ambulatory Visit: Payer: Self-pay | Admitting: Family Medicine

## 2007-05-13 LAB — CONVERTED CEMR LAB
ALT: 19 units/L (ref 0–35)
Albumin: 3.8 g/dL (ref 3.5–5.2)
CO2: 23 meq/L (ref 19–32)
Calcium: 8.8 mg/dL (ref 8.4–10.5)
Chloride: 102 meq/L (ref 96–112)
Glucose, Bld: 122 mg/dL — ABNORMAL HIGH (ref 70–99)
Hgb A1c MFr Bld: 6.6 %
MCV: 88.4 fL (ref 78.0–100.0)
Pap Smear: NORMAL
Pap Smear: NORMAL
Platelets: 250 10*3/uL (ref 150–400)
Potassium: 4.1 meq/L (ref 3.5–5.3)
RBC: 4.13 M/uL (ref 3.87–5.11)
Sodium: 144 meq/L (ref 135–145)
Total Protein: 7.3 g/dL (ref 6.0–8.3)
WBC: 4.6 10*3/uL (ref 4.0–10.5)

## 2007-05-17 ENCOUNTER — Encounter (INDEPENDENT_AMBULATORY_CARE_PROVIDER_SITE_OTHER): Payer: Self-pay | Admitting: Family Medicine

## 2007-06-09 ENCOUNTER — Encounter (INDEPENDENT_AMBULATORY_CARE_PROVIDER_SITE_OTHER): Payer: Self-pay | Admitting: Family Medicine

## 2007-06-09 ENCOUNTER — Ambulatory Visit: Payer: Self-pay | Admitting: Family Medicine

## 2007-06-09 LAB — CONVERTED CEMR LAB
Cholesterol: 182 mg/dL (ref 0–200)
Triglycerides: 59 mg/dL (ref <150)
VLDL: 12 mg/dL (ref 0–40)

## 2007-06-10 ENCOUNTER — Ambulatory Visit: Payer: Self-pay | Admitting: Family Medicine

## 2007-06-15 ENCOUNTER — Encounter (INDEPENDENT_AMBULATORY_CARE_PROVIDER_SITE_OTHER): Payer: Self-pay | Admitting: Family Medicine

## 2007-06-22 ENCOUNTER — Ambulatory Visit: Payer: Self-pay | Admitting: Family Medicine

## 2007-06-28 ENCOUNTER — Ambulatory Visit: Payer: Self-pay | Admitting: Family Medicine

## 2007-06-29 ENCOUNTER — Encounter: Admission: RE | Admit: 2007-06-29 | Discharge: 2007-06-29 | Payer: Self-pay | Admitting: Family Medicine

## 2007-06-30 ENCOUNTER — Encounter (INDEPENDENT_AMBULATORY_CARE_PROVIDER_SITE_OTHER): Payer: Self-pay | Admitting: Family Medicine

## 2007-07-01 ENCOUNTER — Ambulatory Visit: Payer: Self-pay | Admitting: Sports Medicine

## 2007-07-26 ENCOUNTER — Encounter (INDEPENDENT_AMBULATORY_CARE_PROVIDER_SITE_OTHER): Payer: Self-pay | Admitting: Family Medicine

## 2007-08-11 ENCOUNTER — Encounter (INDEPENDENT_AMBULATORY_CARE_PROVIDER_SITE_OTHER): Payer: Self-pay | Admitting: Family Medicine

## 2007-10-17 ENCOUNTER — Ambulatory Visit: Payer: Self-pay | Admitting: Family Medicine

## 2007-11-20 ENCOUNTER — Other Ambulatory Visit: Payer: Self-pay | Admitting: Emergency Medicine

## 2007-11-20 ENCOUNTER — Encounter: Payer: Self-pay | Admitting: Sports Medicine

## 2007-11-20 ENCOUNTER — Ambulatory Visit: Payer: Self-pay | Admitting: Family Medicine

## 2007-11-20 ENCOUNTER — Observation Stay (HOSPITAL_COMMUNITY): Admission: AD | Admit: 2007-11-20 | Discharge: 2007-11-21 | Payer: Self-pay | Admitting: Family Medicine

## 2007-11-21 ENCOUNTER — Encounter (INDEPENDENT_AMBULATORY_CARE_PROVIDER_SITE_OTHER): Payer: Self-pay | Admitting: Family Medicine

## 2007-11-22 ENCOUNTER — Ambulatory Visit: Payer: Self-pay | Admitting: Family Medicine

## 2007-11-22 ENCOUNTER — Encounter: Payer: Self-pay | Admitting: Family Medicine

## 2007-11-22 ENCOUNTER — Telehealth: Payer: Self-pay | Admitting: *Deleted

## 2007-11-30 ENCOUNTER — Encounter: Payer: Self-pay | Admitting: Family Medicine

## 2007-11-30 ENCOUNTER — Telehealth (INDEPENDENT_AMBULATORY_CARE_PROVIDER_SITE_OTHER): Payer: Self-pay | Admitting: Family Medicine

## 2007-12-07 ENCOUNTER — Ambulatory Visit: Payer: Self-pay | Admitting: Family Medicine

## 2007-12-07 ENCOUNTER — Encounter: Payer: Self-pay | Admitting: Family Medicine

## 2007-12-07 ENCOUNTER — Encounter: Payer: Self-pay | Admitting: *Deleted

## 2007-12-07 LAB — CONVERTED CEMR LAB

## 2007-12-08 LAB — CONVERTED CEMR LAB
HCT: 37.7 % (ref 36.0–46.0)
Hemoglobin: 12.1 g/dL (ref 12.0–15.0)
RDW: 15.4 % (ref 11.5–15.5)
WBC: 5.9 10*3/uL (ref 4.0–10.5)

## 2007-12-12 ENCOUNTER — Encounter: Payer: Self-pay | Admitting: Family Medicine

## 2007-12-27 ENCOUNTER — Ambulatory Visit: Payer: Self-pay | Admitting: Critical Care Medicine

## 2008-01-09 ENCOUNTER — Ambulatory Visit: Payer: Self-pay | Admitting: Critical Care Medicine

## 2008-02-03 ENCOUNTER — Encounter: Payer: Self-pay | Admitting: Family Medicine

## 2008-02-10 ENCOUNTER — Ambulatory Visit: Payer: Self-pay | Admitting: Critical Care Medicine

## 2008-02-13 ENCOUNTER — Ambulatory Visit: Payer: Self-pay | Admitting: Pulmonary Disease

## 2008-02-13 LAB — CONVERTED CEMR LAB
Iron: 66 ug/dL (ref 42–145)
Saturation Ratios: 17.4 % — ABNORMAL LOW (ref 20.0–50.0)
Transferrin: 271.4 mg/dL (ref >=212.0)

## 2008-02-15 ENCOUNTER — Ambulatory Visit: Payer: Self-pay | Admitting: Family Medicine

## 2008-03-23 ENCOUNTER — Ambulatory Visit: Payer: Self-pay | Admitting: Critical Care Medicine

## 2008-03-26 ENCOUNTER — Encounter: Admission: RE | Admit: 2008-03-26 | Discharge: 2008-03-26 | Payer: Self-pay | Admitting: Sports Medicine

## 2008-03-27 ENCOUNTER — Encounter: Payer: Self-pay | Admitting: Family Medicine

## 2008-03-28 ENCOUNTER — Encounter: Payer: Self-pay | Admitting: Family Medicine

## 2008-03-28 ENCOUNTER — Encounter: Admission: RE | Admit: 2008-03-28 | Discharge: 2008-05-25 | Payer: Self-pay | Admitting: Family Medicine

## 2008-05-24 ENCOUNTER — Encounter: Payer: Self-pay | Admitting: Family Medicine

## 2008-05-25 ENCOUNTER — Encounter: Payer: Self-pay | Admitting: Family Medicine

## 2008-06-03 ENCOUNTER — Emergency Department (HOSPITAL_COMMUNITY): Admission: EM | Admit: 2008-06-03 | Discharge: 2008-06-03 | Payer: Self-pay | Admitting: Emergency Medicine

## 2008-06-03 ENCOUNTER — Ambulatory Visit: Payer: Self-pay | Admitting: Vascular Surgery

## 2008-06-03 ENCOUNTER — Encounter (INDEPENDENT_AMBULATORY_CARE_PROVIDER_SITE_OTHER): Payer: Self-pay | Admitting: Emergency Medicine

## 2008-06-04 ENCOUNTER — Encounter: Admission: RE | Admit: 2008-06-04 | Discharge: 2008-06-04 | Payer: Self-pay | Admitting: Family Medicine

## 2008-06-04 ENCOUNTER — Ambulatory Visit: Payer: Self-pay | Admitting: Family Medicine

## 2008-06-04 DIAGNOSIS — M79609 Pain in unspecified limb: Secondary | ICD-10-CM

## 2008-06-04 HISTORY — DX: Pain in unspecified limb: M79.609

## 2008-06-04 LAB — CONVERTED CEMR LAB: Hgb A1c MFr Bld: 7.7 %

## 2008-06-05 ENCOUNTER — Ambulatory Visit: Payer: Self-pay | Admitting: Critical Care Medicine

## 2008-06-07 ENCOUNTER — Ambulatory Visit: Payer: Self-pay | Admitting: Family Medicine

## 2008-07-04 ENCOUNTER — Ambulatory Visit: Payer: Self-pay | Admitting: Family Medicine

## 2008-07-11 ENCOUNTER — Encounter: Payer: Self-pay | Admitting: Family Medicine

## 2008-07-16 ENCOUNTER — Encounter: Payer: Self-pay | Admitting: Family Medicine

## 2008-07-20 ENCOUNTER — Encounter: Payer: Self-pay | Admitting: Family Medicine

## 2008-07-20 ENCOUNTER — Encounter: Admission: RE | Admit: 2008-07-20 | Discharge: 2008-09-18 | Payer: Self-pay | Admitting: Family Medicine

## 2008-08-13 ENCOUNTER — Encounter: Payer: Self-pay | Admitting: Family Medicine

## 2008-08-28 ENCOUNTER — Ambulatory Visit: Payer: Self-pay | Admitting: Family Medicine

## 2008-08-28 ENCOUNTER — Encounter: Payer: Self-pay | Admitting: *Deleted

## 2008-08-28 DIAGNOSIS — M1711 Unilateral primary osteoarthritis, right knee: Secondary | ICD-10-CM

## 2008-08-28 HISTORY — DX: Unilateral primary osteoarthritis, right knee: M17.11

## 2008-09-11 ENCOUNTER — Encounter: Payer: Self-pay | Admitting: Family Medicine

## 2008-09-18 ENCOUNTER — Encounter: Payer: Self-pay | Admitting: Family Medicine

## 2008-10-25 ENCOUNTER — Encounter: Payer: Self-pay | Admitting: Family Medicine

## 2008-10-30 ENCOUNTER — Encounter: Payer: Self-pay | Admitting: Family Medicine

## 2008-10-30 ENCOUNTER — Ambulatory Visit: Payer: Self-pay | Admitting: Internal Medicine

## 2008-10-30 ENCOUNTER — Telehealth: Payer: Self-pay | Admitting: Family Medicine

## 2008-11-10 ENCOUNTER — Encounter: Payer: Self-pay | Admitting: Family Medicine

## 2008-11-10 ENCOUNTER — Encounter: Payer: Self-pay | Admitting: Critical Care Medicine

## 2008-11-27 ENCOUNTER — Telehealth: Payer: Self-pay | Admitting: Critical Care Medicine

## 2008-11-27 ENCOUNTER — Ambulatory Visit: Payer: Self-pay | Admitting: Critical Care Medicine

## 2008-12-06 ENCOUNTER — Ambulatory Visit: Payer: Self-pay | Admitting: Internal Medicine

## 2008-12-12 ENCOUNTER — Encounter: Admission: RE | Admit: 2008-12-12 | Discharge: 2009-03-12 | Payer: Self-pay | Admitting: Critical Care Medicine

## 2008-12-14 ENCOUNTER — Encounter: Payer: Self-pay | Admitting: Family Medicine

## 2008-12-19 ENCOUNTER — Ambulatory Visit: Payer: Self-pay | Admitting: Family Medicine

## 2008-12-24 ENCOUNTER — Telehealth: Payer: Self-pay | Admitting: Family Medicine

## 2008-12-27 ENCOUNTER — Encounter: Payer: Self-pay | Admitting: Family Medicine

## 2008-12-27 ENCOUNTER — Ambulatory Visit: Payer: Self-pay | Admitting: Family Medicine

## 2008-12-27 LAB — CONVERTED CEMR LAB
Albumin: 3.7 g/dL (ref 3.5–5.2)
BUN: 11 mg/dL (ref 6–23)
CO2: 24 meq/L (ref 19–32)
Cholesterol: 140 mg/dL (ref 0–200)
Glucose, Bld: 118 mg/dL — ABNORMAL HIGH (ref 70–99)
HDL: 52 mg/dL (ref >39)
Potassium: 4.3 meq/L (ref 3.5–5.3)
Sodium: 140 meq/L (ref 135–145)
Total Bilirubin: 0.3 mg/dL (ref 0.3–1.2)
Total Protein: 7.2 g/dL (ref 6.0–8.3)
Triglycerides: 41 mg/dL (ref <150)

## 2009-01-01 ENCOUNTER — Encounter: Payer: Self-pay | Admitting: Critical Care Medicine

## 2009-01-01 ENCOUNTER — Encounter: Payer: Self-pay | Admitting: Family Medicine

## 2009-01-15 ENCOUNTER — Encounter: Admission: RE | Admit: 2009-01-15 | Discharge: 2009-01-15 | Payer: Self-pay | Admitting: Family Medicine

## 2009-01-15 ENCOUNTER — Encounter: Payer: Self-pay | Admitting: Family Medicine

## 2009-01-21 ENCOUNTER — Encounter: Payer: Self-pay | Admitting: Family Medicine

## 2009-01-21 ENCOUNTER — Ambulatory Visit: Payer: Self-pay | Admitting: Family Medicine

## 2009-01-23 ENCOUNTER — Encounter: Payer: Self-pay | Admitting: Critical Care Medicine

## 2009-01-23 ENCOUNTER — Encounter: Payer: Self-pay | Admitting: Family Medicine

## 2009-02-12 ENCOUNTER — Encounter: Payer: Self-pay | Admitting: Family Medicine

## 2009-02-16 ENCOUNTER — Encounter: Payer: Self-pay | Admitting: Family Medicine

## 2009-03-04 ENCOUNTER — Telehealth (INDEPENDENT_AMBULATORY_CARE_PROVIDER_SITE_OTHER): Payer: Self-pay | Admitting: *Deleted

## 2009-03-04 ENCOUNTER — Ambulatory Visit: Payer: Self-pay | Admitting: Pulmonary Disease

## 2009-03-11 ENCOUNTER — Ambulatory Visit: Payer: Self-pay | Admitting: Critical Care Medicine

## 2009-03-19 ENCOUNTER — Ambulatory Visit: Payer: Self-pay | Admitting: Critical Care Medicine

## 2009-03-20 ENCOUNTER — Encounter: Payer: Self-pay | Admitting: Family Medicine

## 2009-03-27 ENCOUNTER — Encounter: Admission: RE | Admit: 2009-03-27 | Discharge: 2009-03-27 | Payer: Self-pay | Admitting: Family Medicine

## 2009-04-08 ENCOUNTER — Ambulatory Visit: Payer: Self-pay | Admitting: Family Medicine

## 2009-04-16 ENCOUNTER — Encounter: Admission: RE | Admit: 2009-04-16 | Discharge: 2009-07-15 | Payer: Self-pay | Admitting: Family Medicine

## 2009-04-30 ENCOUNTER — Encounter: Payer: Self-pay | Admitting: Family Medicine

## 2009-05-01 ENCOUNTER — Encounter: Payer: Self-pay | Admitting: Family Medicine

## 2009-05-07 ENCOUNTER — Ambulatory Visit: Payer: Self-pay | Admitting: Family Medicine

## 2009-05-13 ENCOUNTER — Telehealth: Payer: Self-pay | Admitting: Family Medicine

## 2009-05-15 ENCOUNTER — Encounter: Payer: Self-pay | Admitting: Family Medicine

## 2009-05-15 ENCOUNTER — Ambulatory Visit: Payer: Self-pay | Admitting: Family Medicine

## 2009-05-15 ENCOUNTER — Telehealth: Payer: Self-pay | Admitting: Family Medicine

## 2009-06-12 ENCOUNTER — Encounter: Payer: Self-pay | Admitting: Family Medicine

## 2009-06-17 ENCOUNTER — Ambulatory Visit: Payer: Self-pay | Admitting: Critical Care Medicine

## 2009-06-19 ENCOUNTER — Ambulatory Visit: Payer: Self-pay | Admitting: Family Medicine

## 2009-06-19 ENCOUNTER — Encounter: Payer: Self-pay | Admitting: Family Medicine

## 2009-06-19 LAB — CONVERTED CEMR LAB: Hgb A1c MFr Bld: 7.4 %

## 2009-06-26 ENCOUNTER — Encounter: Payer: Self-pay | Admitting: Family Medicine

## 2009-07-01 ENCOUNTER — Encounter: Payer: Self-pay | Admitting: *Deleted

## 2009-07-05 ENCOUNTER — Encounter: Payer: Self-pay | Admitting: Family Medicine

## 2009-07-08 ENCOUNTER — Encounter: Payer: Self-pay | Admitting: Family Medicine

## 2009-07-11 ENCOUNTER — Encounter: Payer: Self-pay | Admitting: Family Medicine

## 2009-07-16 ENCOUNTER — Encounter: Admission: RE | Admit: 2009-07-16 | Discharge: 2009-07-16 | Payer: Self-pay | Admitting: Family Medicine

## 2009-08-23 ENCOUNTER — Encounter: Payer: Self-pay | Admitting: Critical Care Medicine

## 2009-08-23 ENCOUNTER — Encounter: Payer: Self-pay | Admitting: Family Medicine

## 2009-09-03 ENCOUNTER — Ambulatory Visit: Payer: Self-pay | Admitting: Critical Care Medicine

## 2009-09-23 ENCOUNTER — Ambulatory Visit: Payer: Self-pay | Admitting: Family Medicine

## 2009-09-23 LAB — CONVERTED CEMR LAB: Hgb A1c MFr Bld: 6.9 %

## 2009-10-15 ENCOUNTER — Ambulatory Visit: Payer: Self-pay | Admitting: Critical Care Medicine

## 2009-10-16 ENCOUNTER — Encounter: Admission: RE | Admit: 2009-10-16 | Discharge: 2009-10-16 | Payer: Self-pay | Admitting: Family Medicine

## 2009-10-17 ENCOUNTER — Encounter: Payer: Self-pay | Admitting: Family Medicine

## 2009-10-22 ENCOUNTER — Ambulatory Visit: Payer: Self-pay | Admitting: Family Medicine

## 2009-10-22 ENCOUNTER — Telehealth: Payer: Self-pay | Admitting: Family Medicine

## 2009-10-22 DIAGNOSIS — M76899 Other specified enthesopathies of unspecified lower limb, excluding foot: Secondary | ICD-10-CM | POA: Insufficient documentation

## 2009-10-31 ENCOUNTER — Telehealth (INDEPENDENT_AMBULATORY_CARE_PROVIDER_SITE_OTHER): Payer: Self-pay | Admitting: *Deleted

## 2009-11-19 ENCOUNTER — Ambulatory Visit: Payer: Self-pay | Admitting: Critical Care Medicine

## 2009-12-04 ENCOUNTER — Encounter: Admission: RE | Admit: 2009-12-04 | Discharge: 2009-12-04 | Payer: Self-pay | Admitting: Emergency Medicine

## 2009-12-04 ENCOUNTER — Ambulatory Visit: Payer: Self-pay | Admitting: Family Medicine

## 2009-12-09 ENCOUNTER — Ambulatory Visit: Payer: Self-pay | Admitting: Critical Care Medicine

## 2009-12-13 ENCOUNTER — Encounter: Payer: Self-pay | Admitting: Critical Care Medicine

## 2009-12-31 ENCOUNTER — Ambulatory Visit: Payer: Self-pay | Admitting: Family Medicine

## 2009-12-31 ENCOUNTER — Encounter: Payer: Self-pay | Admitting: Family Medicine

## 2010-01-02 LAB — CONVERTED CEMR LAB
Albumin: 4.1 g/dL (ref 3.5–5.2)
Alkaline Phosphatase: 69 units/L (ref 39–117)
CO2: 24 meq/L (ref 19–32)
Calcium: 8.3 mg/dL — ABNORMAL LOW (ref 8.4–10.5)
Chloride: 104 meq/L (ref 96–112)
Cholesterol: 162 mg/dL (ref 0–200)
Glucose, Bld: 117 mg/dL — ABNORMAL HIGH (ref 70–99)
Hemoglobin: 10.5 g/dL — ABNORMAL LOW (ref 12.0–15.0)
LDL Cholesterol: 93 mg/dL (ref 0–99)
Potassium: 3.9 meq/L (ref 3.5–5.3)
RBC: 3.94 M/uL (ref 3.87–5.11)
Sodium: 139 meq/L (ref 135–145)
Total Protein: 6.9 g/dL (ref 6.0–8.3)
Triglycerides: 53 mg/dL (ref <150)
WBC: 5.6 10*3/uL (ref 4.0–10.5)

## 2010-01-10 ENCOUNTER — Ambulatory Visit: Payer: Self-pay | Admitting: Family Medicine

## 2010-01-10 ENCOUNTER — Encounter: Payer: Self-pay | Admitting: Family Medicine

## 2010-01-10 ENCOUNTER — Ambulatory Visit (HOSPITAL_COMMUNITY): Admission: RE | Admit: 2010-01-10 | Discharge: 2010-01-10 | Payer: Self-pay | Admitting: Family Medicine

## 2010-01-10 DIAGNOSIS — D649 Anemia, unspecified: Secondary | ICD-10-CM | POA: Insufficient documentation

## 2010-01-13 LAB — CONVERTED CEMR LAB
Basophils Absolute: 0 10*3/uL (ref 0.0–0.1)
Hemoglobin: 10.6 g/dL — ABNORMAL LOW (ref 12.0–15.0)
Lymphocytes Relative: 31 % (ref 12–46)
Monocytes Absolute: 0.7 10*3/uL (ref 0.1–1.0)
Neutro Abs: 3.6 10*3/uL (ref 1.7–7.7)
RDW: 16.5 % — ABNORMAL HIGH (ref 11.5–15.5)
Retic Ct Pct: 1.3 % (ref 0.4–3.1)
Saturation Ratios: 11 % — ABNORMAL LOW (ref 20–55)
TIBC: 443 ug/dL (ref 250–470)
WBC: 6.5 10*3/uL (ref 4.0–10.5)

## 2010-01-14 ENCOUNTER — Encounter: Payer: Self-pay | Admitting: Family Medicine

## 2010-01-14 ENCOUNTER — Encounter
Admission: RE | Admit: 2010-01-14 | Discharge: 2010-01-14 | Payer: Self-pay | Source: Home / Self Care | Attending: Family Medicine | Admitting: Family Medicine

## 2010-01-14 ENCOUNTER — Ambulatory Visit: Payer: Self-pay | Admitting: Family Medicine

## 2010-01-15 ENCOUNTER — Encounter: Payer: Self-pay | Admitting: Family Medicine

## 2010-01-21 ENCOUNTER — Ambulatory Visit: Payer: Self-pay | Admitting: Critical Care Medicine

## 2010-01-21 ENCOUNTER — Telehealth: Payer: Self-pay | Admitting: Adult Health

## 2010-01-23 ENCOUNTER — Telehealth: Payer: Self-pay | Admitting: Critical Care Medicine

## 2010-01-26 ENCOUNTER — Encounter: Payer: Self-pay | Admitting: Family Medicine

## 2010-01-27 ENCOUNTER — Telehealth: Payer: Self-pay | Admitting: Family Medicine

## 2010-01-28 ENCOUNTER — Ambulatory Visit: Payer: Self-pay | Admitting: Critical Care Medicine

## 2010-01-28 ENCOUNTER — Encounter: Payer: Self-pay | Admitting: Critical Care Medicine

## 2010-01-29 ENCOUNTER — Encounter: Payer: Self-pay | Admitting: Critical Care Medicine

## 2010-02-05 ENCOUNTER — Ambulatory Visit: Payer: Self-pay | Admitting: Critical Care Medicine

## 2010-02-14 ENCOUNTER — Ambulatory Visit: Payer: Self-pay | Admitting: Critical Care Medicine

## 2010-02-25 ENCOUNTER — Ambulatory Visit: Payer: Self-pay | Admitting: Family Medicine

## 2010-03-03 ENCOUNTER — Telehealth (INDEPENDENT_AMBULATORY_CARE_PROVIDER_SITE_OTHER): Payer: Self-pay | Admitting: *Deleted

## 2010-03-03 ENCOUNTER — Ambulatory Visit: Payer: Self-pay | Admitting: Critical Care Medicine

## 2010-03-03 ENCOUNTER — Ambulatory Visit: Payer: Self-pay | Admitting: Internal Medicine

## 2010-03-10 ENCOUNTER — Ambulatory Visit: Payer: Self-pay | Admitting: Cardiology

## 2010-03-10 ENCOUNTER — Ambulatory Visit: Payer: Self-pay | Admitting: Critical Care Medicine

## 2010-03-10 LAB — CONVERTED CEMR LAB
BUN: 17 mg/dL (ref 6–23)
Chloride: 102 meq/L (ref 96–112)
Potassium: 3.8 meq/L (ref 3.5–5.1)

## 2010-03-19 ENCOUNTER — Ambulatory Visit: Payer: Self-pay | Admitting: Critical Care Medicine

## 2010-03-20 ENCOUNTER — Telehealth (INDEPENDENT_AMBULATORY_CARE_PROVIDER_SITE_OTHER): Payer: Self-pay | Admitting: *Deleted

## 2010-03-26 ENCOUNTER — Encounter: Payer: Self-pay | Admitting: Family Medicine

## 2010-03-31 ENCOUNTER — Encounter
Admission: RE | Admit: 2010-03-31 | Discharge: 2010-03-31 | Payer: Self-pay | Source: Home / Self Care | Attending: Family Medicine | Admitting: Family Medicine

## 2010-04-09 ENCOUNTER — Ambulatory Visit: Payer: Self-pay | Admitting: Critical Care Medicine

## 2010-04-13 DIAGNOSIS — I469 Cardiac arrest, cause unspecified: Secondary | ICD-10-CM

## 2010-04-13 DIAGNOSIS — I509 Heart failure, unspecified: Secondary | ICD-10-CM

## 2010-04-13 HISTORY — DX: Heart failure, unspecified: I50.9

## 2010-04-13 HISTORY — DX: Cardiac arrest, cause unspecified: I46.9

## 2010-04-17 ENCOUNTER — Encounter: Admit: 2010-04-17 | Payer: Self-pay | Admitting: Family Medicine

## 2010-04-21 ENCOUNTER — Encounter: Payer: Self-pay | Admitting: Family Medicine

## 2010-04-22 ENCOUNTER — Telehealth (INDEPENDENT_AMBULATORY_CARE_PROVIDER_SITE_OTHER): Payer: Self-pay | Admitting: *Deleted

## 2010-04-22 ENCOUNTER — Ambulatory Visit
Admission: RE | Admit: 2010-04-22 | Discharge: 2010-04-22 | Payer: Self-pay | Source: Home / Self Care | Attending: Family Medicine | Admitting: Family Medicine

## 2010-04-22 ENCOUNTER — Encounter: Payer: Self-pay | Admitting: Family Medicine

## 2010-04-22 DIAGNOSIS — R630 Anorexia: Secondary | ICD-10-CM | POA: Insufficient documentation

## 2010-04-22 DIAGNOSIS — R5381 Other malaise: Secondary | ICD-10-CM | POA: Insufficient documentation

## 2010-04-22 DIAGNOSIS — R5383 Other fatigue: Secondary | ICD-10-CM

## 2010-04-22 LAB — CONVERTED CEMR LAB
CO2: 20 meq/L (ref 19–32)
Calcium: 8.8 mg/dL (ref 8.4–10.5)
HCT: 32.8 % — ABNORMAL LOW (ref 36.0–46.0)
Hemoglobin: 10.4 g/dL — ABNORMAL LOW (ref 12.0–15.0)
Potassium: 4.6 meq/L (ref 3.5–5.3)
RDW: 16.7 % — ABNORMAL HIGH (ref 11.5–15.5)
Sodium: 134 meq/L — ABNORMAL LOW (ref 135–145)
WBC: 5.4 10*3/uL (ref 4.0–10.5)

## 2010-04-23 ENCOUNTER — Telehealth: Payer: Self-pay | Admitting: Family Medicine

## 2010-04-26 ENCOUNTER — Telehealth: Payer: Self-pay | Admitting: Family Medicine

## 2010-04-26 ENCOUNTER — Emergency Department (HOSPITAL_COMMUNITY)
Admission: EM | Admit: 2010-04-26 | Discharge: 2010-04-26 | Disposition: A | Discharge status: Other Acute Inpatient Hospital | Payer: Self-pay | Source: Home / Self Care | Admitting: Emergency Medicine

## 2010-04-26 ENCOUNTER — Encounter: Payer: Self-pay | Admitting: Family Medicine

## 2010-04-26 ENCOUNTER — Inpatient Hospital Stay (HOSPITAL_COMMUNITY)
Admission: EM | Admit: 2010-04-26 | Discharge: 2010-05-08 | Payer: Self-pay | Attending: Family Medicine | Admitting: Family Medicine

## 2010-04-27 ENCOUNTER — Encounter: Payer: Self-pay | Admitting: Family Medicine

## 2010-04-27 DIAGNOSIS — I4901 Ventricular fibrillation: Secondary | ICD-10-CM

## 2010-04-27 DIAGNOSIS — J159 Unspecified bacterial pneumonia: Secondary | ICD-10-CM

## 2010-04-27 DIAGNOSIS — J45901 Unspecified asthma with (acute) exacerbation: Secondary | ICD-10-CM

## 2010-04-27 DIAGNOSIS — I5023 Acute on chronic systolic (congestive) heart failure: Secondary | ICD-10-CM

## 2010-04-28 ENCOUNTER — Ambulatory Visit: Admit: 2010-04-28 | Payer: Self-pay

## 2010-04-28 LAB — URINE MICROSCOPIC-ADD ON

## 2010-04-28 LAB — URINE CULTURE
Colony Count: NO GROWTH
Culture  Setup Time: 201201142022
Culture: NO GROWTH

## 2010-04-28 LAB — CARDIAC PANEL(CRET KIN+CKTOT+MB+TROPI)
CK, MB: 1.5 ng/mL (ref 0.3–4.0)
CK, MB: 1.3 ng/mL (ref 0.3–4.0)
CK, MB: 1.4 ng/mL (ref 0.3–4.0)
Relative Index: INVALID (ref 0.0–2.5)
Relative Index: INVALID (ref 0.0–2.5)
Relative Index: INVALID (ref 0.0–2.5)
Total CK: 64 U/L (ref 7–177)
Total CK: 62 U/L (ref 7–177)
Total CK: 65 U/L (ref 7–177)
Troponin I: 0.02 ng/mL (ref 0.00–0.06)
Troponin I: 0.02 ng/mL (ref 0.00–0.06)
Troponin I: 0.02 ng/mL (ref 0.00–0.06)

## 2010-04-28 LAB — URINALYSIS, ROUTINE W REFLEX MICROSCOPIC
Hgb urine dipstick: NEGATIVE
Leukocytes, UA: NEGATIVE
Nitrite: NEGATIVE
Protein, ur: 30 mg/dL — AB
Specific Gravity, Urine: 1.031 — ABNORMAL HIGH (ref 1.005–1.030)
Urine Glucose, Fasting: NEGATIVE mg/dL
Urobilinogen, UA: 1 mg/dL (ref 0.0–1.0)
pH: 5 (ref 5.0–8.0)

## 2010-04-28 LAB — CBC
HCT: 32.5 % — ABNORMAL LOW (ref 36.0–46.0)
Hemoglobin: 10.4 g/dL — ABNORMAL LOW (ref 12.0–15.0)
MCH: 27.2 pg (ref 26.0–34.0)
MCHC: 32 g/dL (ref 30.0–36.0)
MCV: 84.9 fL (ref 78.0–100.0)
Platelets: 267 10*3/uL (ref 150–400)
RBC: 3.83 MIL/uL — ABNORMAL LOW (ref 3.87–5.11)
RDW: 16.4 % — ABNORMAL HIGH (ref 11.5–15.5)
WBC: 5.2 10*3/uL (ref 4.0–10.5)

## 2010-04-28 LAB — GLUCOSE, CAPILLARY
Glucose-Capillary: 160 mg/dL — ABNORMAL HIGH (ref 70–99)
Glucose-Capillary: 178 mg/dL — ABNORMAL HIGH (ref 70–99)
Glucose-Capillary: 291 mg/dL — ABNORMAL HIGH (ref 70–99)
Glucose-Capillary: 266 mg/dL — ABNORMAL HIGH (ref 70–99)
Glucose-Capillary: 396 mg/dL — ABNORMAL HIGH (ref 70–99)

## 2010-04-28 LAB — COMPREHENSIVE METABOLIC PANEL
ALT: 24 U/L (ref 0–35)
AST: 19 U/L (ref 0–37)
Albumin: 3 g/dL — ABNORMAL LOW (ref 3.5–5.2)
Alkaline Phosphatase: 73 U/L (ref 39–117)
BUN: 14 mg/dL (ref 6–23)
CO2: 24 mEq/L (ref 19–32)
Calcium: 8.9 mg/dL (ref 8.4–10.5)
Chloride: 105 mEq/L (ref 96–112)
Creatinine, Ser: 1.08 mg/dL (ref 0.4–1.2)
GFR calc Af Amer: >60 mL/min (ref >60)
GFR calc non Af Amer: 52 mL/min — ABNORMAL LOW (ref >60)
Glucose, Bld: 187 mg/dL — ABNORMAL HIGH (ref 70–99)
Potassium: 4.7 mEq/L (ref 3.5–5.1)
Sodium: 136 mEq/L (ref 135–145)
Total Bilirubin: 1 mg/dL (ref 0.3–1.2)
Total Protein: 7 g/dL (ref 6.0–8.3)

## 2010-04-28 LAB — DIFFERENTIAL
Basophils Absolute: 0 10*3/uL (ref 0.0–0.1)
Basophils Relative: 0 % (ref 0–1)
Eosinophils Absolute: 0.1 10*3/uL (ref 0.0–0.7)
Eosinophils Relative: 1 % (ref 0–5)
Lymphocytes Relative: 38 % (ref 12–46)
Lymphs Abs: 2 10*3/uL (ref 0.7–4.0)
Monocytes Absolute: 0.6 10*3/uL (ref 0.1–1.0)
Monocytes Relative: 11 % (ref 3–12)
Neutro Abs: 2.6 10*3/uL (ref 1.7–7.7)
Neutrophils Relative %: 50 % (ref 43–77)

## 2010-04-28 LAB — LACTIC ACID, PLASMA: Lactic Acid, Venous: 2 mmol/L (ref 0.5–2.2)

## 2010-04-28 LAB — POCT CARDIAC MARKERS
CKMB, poc: <1 ng/mL — ABNORMAL LOW (ref 1.0–8.0)
Myoglobin, poc: 109 ng/mL (ref 12–200)
Troponin i, poc: <0.05 ng/mL (ref 0.00–0.09)

## 2010-04-28 LAB — PROTIME-INR
INR: 4.02 — ABNORMAL HIGH (ref 0.00–1.49)
INR: 3.08 — ABNORMAL HIGH (ref 0.00–1.49)
Prothrombin Time: 39.1 seconds — ABNORMAL HIGH (ref 11.6–15.2)
Prothrombin Time: 31.8 seconds — ABNORMAL HIGH (ref 11.6–15.2)

## 2010-04-28 LAB — BRAIN NATRIURETIC PEPTIDE: Pro B Natriuretic peptide (BNP): 853 pg/mL — ABNORMAL HIGH (ref 0.0–100.0)

## 2010-04-28 LAB — APTT: aPTT: 41 seconds — ABNORMAL HIGH (ref 24–37)

## 2010-04-29 DIAGNOSIS — I509 Heart failure, unspecified: Secondary | ICD-10-CM

## 2010-04-29 DIAGNOSIS — I4901 Ventricular fibrillation: Secondary | ICD-10-CM

## 2010-04-29 DIAGNOSIS — J45901 Unspecified asthma with (acute) exacerbation: Secondary | ICD-10-CM

## 2010-04-30 LAB — BASIC METABOLIC PANEL
BUN: 11 mg/dL (ref 6–23)
BUN: 10 mg/dL (ref 6–23)
CO2: 20 mEq/L (ref 19–32)
CO2: 26 mEq/L (ref 19–32)
Calcium: 8.7 mg/dL (ref 8.4–10.5)
Calcium: 8.3 mg/dL — ABNORMAL LOW (ref 8.4–10.5)
Chloride: 100 mEq/L (ref 96–112)
Chloride: 102 mEq/L (ref 96–112)
Creatinine, Ser: 1.23 mg/dL — ABNORMAL HIGH (ref 0.4–1.2)
Creatinine, Ser: 1 mg/dL (ref 0.4–1.2)
GFR calc Af Amer: 54 mL/min — ABNORMAL LOW (ref >60)
GFR calc Af Amer: >60 mL/min (ref >60)
GFR calc non Af Amer: 45 mL/min — ABNORMAL LOW (ref >60)
GFR calc non Af Amer: 57 mL/min — ABNORMAL LOW (ref >60)
Glucose, Bld: 365 mg/dL — ABNORMAL HIGH (ref 70–99)
Glucose, Bld: 274 mg/dL — ABNORMAL HIGH (ref 70–99)
Potassium: 4.2 mEq/L (ref 3.5–5.1)
Potassium: 4.1 mEq/L (ref 3.5–5.1)
Sodium: 131 mEq/L — ABNORMAL LOW (ref 135–145)
Sodium: 135 mEq/L (ref 135–145)

## 2010-04-30 LAB — TSH: TSH: 2.158 u[IU]/mL (ref 0.350–4.500)

## 2010-04-30 LAB — POCT I-STAT 3, ART BLOOD GAS (G3+)
Acid-base deficit: 3 mmol/L — ABNORMAL HIGH (ref 0.0–2.0)
Acid-base deficit: 3 mmol/L — ABNORMAL HIGH (ref 0.0–2.0)
Bicarbonate: 21.7 meq/L (ref 20.0–24.0)
Bicarbonate: 22.1 mEq/L (ref 20.0–24.0)
O2 Saturation: 80 %
O2 Saturation: 100 %
TCO2: 23 mmol/L (ref 0–100)
TCO2: 23 mmol/L (ref 0–100)
pCO2 arterial: 36.2 mmHg (ref 35.0–45.0)
pCO2 arterial: 37.4 mmHg (ref 35.0–45.0)
pH, Arterial: 7.386 (ref 7.350–7.400)
pH, Arterial: 7.379 (ref 7.350–7.400)
pO2, Arterial: 45 mmHg — ABNORMAL LOW (ref 80.0–100.0)
pO2, Arterial: 289 mmHg — ABNORMAL HIGH (ref 80.0–100.0)

## 2010-04-30 LAB — GLUCOSE, CAPILLARY
Glucose-Capillary: 326 mg/dL — ABNORMAL HIGH (ref 70–99)
Glucose-Capillary: 358 mg/dL — ABNORMAL HIGH (ref 70–99)
Glucose-Capillary: 272 mg/dL — ABNORMAL HIGH (ref 70–99)
Glucose-Capillary: 267 mg/dL — ABNORMAL HIGH (ref 70–99)
Glucose-Capillary: 374 mg/dL — ABNORMAL HIGH (ref 70–99)
Glucose-Capillary: 335 mg/dL — ABNORMAL HIGH (ref 70–99)
Glucose-Capillary: 222 mg/dL — ABNORMAL HIGH (ref 70–99)
Glucose-Capillary: 274 mg/dL — ABNORMAL HIGH (ref 70–99)
Glucose-Capillary: 375 mg/dL — ABNORMAL HIGH (ref 70–99)
Glucose-Capillary: 324 mg/dL — ABNORMAL HIGH (ref 70–99)

## 2010-04-30 LAB — CBC
HCT: 32.9 % — ABNORMAL LOW (ref 36.0–46.0)
Hemoglobin: 10.6 g/dL — ABNORMAL LOW (ref 12.0–15.0)
MCH: 26.9 pg (ref 26.0–34.0)
MCHC: 32.2 g/dL (ref 30.0–36.0)
MCV: 83.5 fL (ref 78.0–100.0)
Platelets: 281 10*3/uL (ref 150–400)
RBC: 3.94 MIL/uL (ref 3.87–5.11)
RDW: 16.2 % — ABNORMAL HIGH (ref 11.5–15.5)
WBC: 7.2 10*3/uL (ref 4.0–10.5)

## 2010-04-30 LAB — CARDIAC PANEL(CRET KIN+CKTOT+MB+TROPI)
CK, MB: 1.8 ng/mL (ref 0.3–4.0)
Relative Index: INVALID (ref 0.0–2.5)
Total CK: 62 U/L (ref 7–177)

## 2010-04-30 LAB — PROTIME-INR
INR: 2.81 — ABNORMAL HIGH (ref 0.00–1.49)
INR: 2.19 — ABNORMAL HIGH (ref 0.00–1.49)
Prothrombin Time: 29.7 seconds — ABNORMAL HIGH (ref 11.6–15.2)
Prothrombin Time: 24.5 s — ABNORMAL HIGH (ref 11.6–15.2)

## 2010-04-30 LAB — DIGOXIN LEVEL: Digoxin Level: 0.3 ng/mL — ABNORMAL LOW (ref 0.8–2.0)

## 2010-04-30 LAB — BRAIN NATRIURETIC PEPTIDE
Pro B Natriuretic peptide (BNP): 1374 pg/mL — ABNORMAL HIGH (ref 0.0–100.0)
Pro B Natriuretic peptide (BNP): 877 pg/mL — ABNORMAL HIGH (ref 0.0–100.0)

## 2010-04-30 LAB — LIDOCAINE LEVEL: Lidocaine Lvl: 2.1 ug/mL (ref 1.5–5.0)

## 2010-04-30 LAB — MAGNESIUM: Magnesium: 2.2 mg/dL (ref 1.5–2.5)

## 2010-04-30 LAB — MRSA PCR SCREENING: MRSA by PCR: POSITIVE — AB

## 2010-05-01 NOTE — Consult Note (Signed)
NAME:  [name] NO.:  [id number]  MEDICAL RECORD NO.:  [id number]           PATIENT TYPE:  LOCATION:                                 FACILITY:  PHYSICIAN:  Duke Salvia, MD, FACCDATE OF BIRTH:  Sep 01, 1950  DATE OF CONSULTATION:  04/28/2010 DATE OF DISCHARGE:                                CONSULTATION   Thank you very much for asking [place] to see [name] in consultation because of ventricular tachycardia.  The patient is a 60 year old woman with a complex arrhythmia history occurring in the setting of an unknown cardiomyopathy.  Her catheterization in 2007 demonstrated nonobstructive disease.  Ejection fraction at that time was 45% to 50% with an echo in 2004 having demonstrated normal left ventricular function.  At this time, she has an ejection fraction of 35% with multiple wall motion abnormalities.  She also has a history of atrial fibrillation.  Because of refractory symptoms, she was referred to Dr. Sampson Goon and underwent pulmonary vein isolation on two occasions, most recently a couple of years ago. The subsequent notes described her as having persistent atrial tachycardia "not amenable to ablation."  She was treated with Tikosyn. Because of QT prolongation issues, the Tikosyn dose was decreased from 500-250 mg a day.  The patient has had generalized complaints recently of fatigue, dyspnea, and malaise with worsening functional status over the last couple of months seeing Dr. Delford Field, Pulmonary, rather frequently in the interim.  Because of the aforementioned symptoms, she was brought to the hospital and admitted on April 28, 2010.  Her BNP was elevated because of concerns about pneumonia, Avelox was started.  I should note that her QTc on the first electrocardiogram we have available is approximately 520-530 msec.  Thirty six hours later, i.e. this morning, at 1 o'clock, she had a run of polymorphic ventricular tachycardia,  requiring defibrillation.  This occurred in the setting of frequent ventricular ectopy and pausing.  Potassium and magnesium levels were normal.  She did receive magnesium following the event.  She has continued to have frequent ventricular ectopy while in the CCU.  She has also had frequent supraventricular ectopy with runs of nonsustained atrial tachycardia.  Her echo was normal as noted above.  Her cardiac troponins were normal.  PAST MEDICAL HISTORY: 1. The patient has a complex past medical history in addition.  Her     thromboembolic risk factors are notable for diabetes, hypertension,     and gender. 2. GE reflux disease. 3. Hyperlipidemia. 4. Major recurrent depression. 5. Persistent asthma. 6. Vocal cord dysfunction. 7. Sleep apnea. 8. Iron deficiency, for which she takes supplementations.  Her medication list as an outpatient included Cozaar, Coumadin, Flonase, Nexium, Tikosyn 250, CPAP, Pravachol 20, metformin 500 b.i.d., Glucotrol 5 b.i.d., diltiazem 300, iron sulfate, Tessalon, Xopenex, Lasix p.r.n., and Xanax p.r.n.  Inpatient medications include prednisone, Tikosyn currently on hold, pravastatin, metformin, Avelox on hold, Aldactone.  PAST SURGICAL HISTORY: 1. Nissen fundoplication in 1998. 2. Colonoscopy in 2011 with pan diverticulitis.  FAMILY HISTORY:  Parents are deceased.  SOCIAL HISTORY:  The patient lives with her daughter.  She does  not use cigarettes or alcohol.  REVIEW OF SYSTEMS:  Broadly negative apart from what was outlined previously.  On examination, she is a middle-aged [place] American female appearing considerably older than her stated age of 12.  Her blood pressure is 102/56, her pulse ranged very rapidly from 75-110 and it was quite irregular.  Her HEENT exam was normal with disconjugate gaze.  Her neck was supple with no thyromegaly.  Neck veins were 6-7 cm.  There was no lymphadenopathy.  There was decreased breath sounds  particularly on the left.  There was no kyphosis or scoliosis.  Heart sounds were rapid and irregular with a 2/6 systolic murmur.  The abdomen was soft and protuberant.  Bowel sounds were active.  There was no clubbing, cyanosis, or edema.  Neurological exam was grossly normal.  The affect was somewhat flat.  The skin was warm and dry.  Electrocardiogram dated this morning at 2:35 showed junctional rhythm at 59 with intervals - 0.11/0.59 with a QTc of 0.58.  Electrocardiogram at 1534 hours yesterday demonstrated sinus rhythm with PACs associated with wide complex, most clearly seen in lead V1 and inferred for other leads. Electrocardiogram from 1534 also has broad QRS beats with a superior axis morphology, now more monophasic upright in lead V1 and without clear preceding P-waves.  Laboratories this morning had a potassium of 4.2, hemoglobin was a little low at 10.6.  Echo was as noted previously.  IMPRESSION: 1. Ventricular tachycardia and ventricular fibrillation in the setting     of QT prolongation with baseline Tikosyn and recently added Avelox     with some degree of pause dependency consistent but not classic for     torsade de pointes. 2. Paroxysmal atrial fibrillation status post percutaneous coronary     intervention x2 at [facility] with most recent note describing     "residual premature atrial contractions not amenable to ablation"     and now with nonsustained atrial tachycardia. 3. Thromboembolic risk factors notable for:     a.     Hypertension.     b.     Gender.     c.     Diabetes. 4. Congestive heart failure, acute on chronic, systolic and diastolic,     with echocardiogram demonstrating multiple wall motion     abnormalities.  DISCUSSION:  [name] had a polymorphic ventricular tachycardia arrest in the absence of evidence of acute coronary syndromes and with evidence of marked QT prolongation and 2 drugs which could contribute to this, both the Avelox  and the Tikosyn.  I agree with the decision made here to discontinue both of these drugs.  I think the limiting of exposure to QT- prolonging drugs needs to be chosen.  The fact that there are new wall motion abnormalities on the echo and a significant change in the description of left ventricular function, raises the possibility of coronary disease and whether ischemia may not be contributing to both QT prolongation as well as the polymorphic rhythms in the absence of positive enzymes.  Hence, I think catheterization is indicated.  Because of the potential role of ischemia and because of its potential value also in QT prolongation, we will try lidocaine for suppression of her ventricular ectopy with some trepidation given its potential to have neuropsychiatric side effects.  We will target a low dose and check the level in the morning.  I will attempt to also to get old electrocardiograms to see whether we have a baseline of  QT tolerance for her Tikosyn, but I suspect that Tikosyn and sotalol would need to be excluded from her atrial fibrillation armamentarium going forward given her left ventricular dysfunction once these would also be excluded and I am not a fan of dronedarone.  This leaves [place] with a possibility of amiodarone versus rate control.  Rate control in her given her lung disease might require AV junction ablation and pacing.  I am a little bit concerned about the potential also of amiodarone to further prolong the QT, although typically it is not a drug that is associated with the development of torsade de pointes.  We will follow with you.     Duke Salvia, MD, [facility]     SCK/MEDQ  D:  04/28/2010  T:  04/29/2010  Job:  [id number]  Electronically Signed by Sherryl Manges MD [facility] on 05/01/2010 01:43:58 PM

## 2010-05-02 HISTORY — PX: CARDIAC DEFIBRILLATOR PLACEMENT: SHX171

## 2010-05-05 LAB — CBC
HCT: 32 % — ABNORMAL LOW (ref 36.0–46.0)
HCT: 31.1 % — ABNORMAL LOW (ref 36.0–46.0)
Hemoglobin: 10.1 g/dL — ABNORMAL LOW (ref 12.0–15.0)
Hemoglobin: 10 g/dL — ABNORMAL LOW (ref 12.0–15.0)
Hemoglobin: 9.8 g/dL — ABNORMAL LOW (ref 12.0–15.0)
MCH: 26.6 pg (ref 26.0–34.0)
MCH: 26.7 pg (ref 26.0–34.0)
MCHC: 32 g/dL (ref 30.0–36.0)
MCHC: 31.3 g/dL (ref 30.0–36.0)
MCHC: 31.5 g/dL (ref 30.0–36.0)
MCV: 83.4 fL (ref 78.0–100.0)
RBC: 3.75 MIL/uL — ABNORMAL LOW (ref 3.87–5.11)
RDW: 16.5 % — ABNORMAL HIGH (ref 11.5–15.5)
WBC: 7.5 10*3/uL (ref 4.0–10.5)

## 2010-05-05 LAB — GLUCOSE, CAPILLARY
Glucose-Capillary: 300 mg/dL — ABNORMAL HIGH (ref 70–99)
Glucose-Capillary: 233 mg/dL — ABNORMAL HIGH (ref 70–99)
Glucose-Capillary: 231 mg/dL — ABNORMAL HIGH (ref 70–99)
Glucose-Capillary: 183 mg/dL — ABNORMAL HIGH (ref 70–99)
Glucose-Capillary: 264 mg/dL — ABNORMAL HIGH (ref 70–99)

## 2010-05-05 LAB — PROCALCITONIN
Procalcitonin: <0.1 ng/mL
Procalcitonin: <0.1 ng/mL

## 2010-05-05 LAB — PROTIME-INR
INR: 1.62 — ABNORMAL HIGH (ref 0.00–1.49)
Prothrombin Time: 20.4 seconds — ABNORMAL HIGH (ref 11.6–15.2)
Prothrombin Time: 19.4 seconds — ABNORMAL HIGH (ref 11.6–15.2)

## 2010-05-05 LAB — LIPID PANEL
HDL: 39 mg/dL — ABNORMAL LOW (ref >39)
LDL Cholesterol: 40 mg/dL (ref 0–99)
Total CHOL/HDL Ratio: 2.3 RATIO
Triglycerides: 48 mg/dL (ref <150)
VLDL: 10 mg/dL (ref 0–40)

## 2010-05-05 LAB — BASIC METABOLIC PANEL
BUN: 12 mg/dL (ref 6–23)
BUN: 14 mg/dL (ref 6–23)
CO2: 28 mEq/L (ref 19–32)
CO2: 28 mEq/L (ref 19–32)
CO2: 29 mEq/L (ref 19–32)
Calcium: 8.8 mg/dL (ref 8.4–10.5)
Calcium: 8.4 mg/dL (ref 8.4–10.5)
Creatinine, Ser: 1.04 mg/dL (ref 0.4–1.2)
Creatinine, Ser: 1.16 mg/dL (ref 0.4–1.2)
GFR calc Af Amer: 58 mL/min — ABNORMAL LOW (ref >60)
GFR calc non Af Amer: 54 mL/min — ABNORMAL LOW (ref >60)
GFR calc non Af Amer: 54 mL/min — ABNORMAL LOW (ref >60)
GFR calc non Af Amer: 48 mL/min — ABNORMAL LOW (ref >60)
Glucose, Bld: 201 mg/dL — ABNORMAL HIGH (ref 70–99)
Glucose, Bld: 236 mg/dL — ABNORMAL HIGH (ref 70–99)
Glucose, Bld: 201 mg/dL — ABNORMAL HIGH (ref 70–99)
Potassium: 4.6 mEq/L (ref 3.5–5.1)
Sodium: 137 mEq/L (ref 135–145)
Sodium: 136 mEq/L (ref 135–145)

## 2010-05-05 LAB — CALCIUM, IONIZED: Calcium, Ion: 1.23 mmol/L (ref 1.12–1.32)

## 2010-05-05 LAB — HEMOGLOBIN A1C
Hgb A1c MFr Bld: 8.2 % — ABNORMAL HIGH (ref <5.7)
Mean Plasma Glucose: 189 mg/dL — ABNORMAL HIGH (ref <117)

## 2010-05-05 LAB — HEPARIN LEVEL (UNFRACTIONATED)
Heparin Unfractionated: 0.61 IU/mL (ref 0.30–0.70)
Heparin Unfractionated: 0.29 IU/mL — ABNORMAL LOW (ref 0.30–0.70)
Heparin Unfractionated: 0.47 IU/mL (ref 0.30–0.70)

## 2010-05-05 LAB — BRAIN NATRIURETIC PEPTIDE: Pro B Natriuretic peptide (BNP): 1015 pg/mL — ABNORMAL HIGH (ref 0.0–100.0)

## 2010-05-06 ENCOUNTER — Ambulatory Visit: Admit: 2010-05-06 | Payer: Self-pay | Admitting: Critical Care Medicine

## 2010-05-06 LAB — PROTIME-INR
INR: 2.39 — ABNORMAL HIGH (ref 0.00–1.49)
Prothrombin Time: 18.8 seconds — ABNORMAL HIGH (ref 11.6–15.2)
Prothrombin Time: 24.2 seconds — ABNORMAL HIGH (ref 11.6–15.2)

## 2010-05-06 LAB — CBC
HCT: 34.3 % — ABNORMAL LOW (ref 36.0–46.0)
Hemoglobin: 10.6 g/dL — ABNORMAL LOW (ref 12.0–15.0)
MCH: 27 pg (ref 26.0–34.0)
MCH: 26.6 pg (ref 26.0–34.0)
MCHC: 32.1 g/dL (ref 30.0–36.0)
Platelets: 247 10*3/uL (ref 150–400)
Platelets: 258 10*3/uL (ref 150–400)
Platelets: 273 10*3/uL (ref 150–400)
Platelets: 273 10*3/uL (ref 150–400)
RBC: 4.06 MIL/uL (ref 3.87–5.11)
RBC: 4.36 MIL/uL (ref 3.87–5.11)
RBC: 4.36 MIL/uL (ref 3.87–5.11)
RDW: 16.2 % — ABNORMAL HIGH (ref 11.5–15.5)
RDW: 16.3 % — ABNORMAL HIGH (ref 11.5–15.5)
WBC: 11.4 10*3/uL — ABNORMAL HIGH (ref 4.0–10.5)
WBC: 10.7 10*3/uL — ABNORMAL HIGH (ref 4.0–10.5)
WBC: 12.4 10*3/uL — ABNORMAL HIGH (ref 4.0–10.5)

## 2010-05-06 LAB — GLUCOSE, CAPILLARY
Glucose-Capillary: 313 mg/dL — ABNORMAL HIGH (ref 70–99)
Glucose-Capillary: 151 mg/dL — ABNORMAL HIGH (ref 70–99)
Glucose-Capillary: 170 mg/dL — ABNORMAL HIGH (ref 70–99)
Glucose-Capillary: 283 mg/dL — ABNORMAL HIGH (ref 70–99)
Glucose-Capillary: 216 mg/dL — ABNORMAL HIGH (ref 70–99)
Glucose-Capillary: 229 mg/dL — ABNORMAL HIGH (ref 70–99)

## 2010-05-06 LAB — DIFFERENTIAL
Basophils Relative: 0 % (ref 0–1)
Eosinophils Absolute: 0 10*3/uL (ref 0.0–0.7)
Lymphs Abs: 3 10*3/uL (ref 0.7–4.0)
Neutro Abs: 6.9 10*3/uL (ref 1.7–7.7)
Neutrophils Relative %: 64 % (ref 43–77)

## 2010-05-06 LAB — BASIC METABOLIC PANEL
Calcium: 8.7 mg/dL (ref 8.4–10.5)
Chloride: 97 mEq/L (ref 96–112)
Creatinine, Ser: 1.14 mg/dL (ref 0.4–1.2)
GFR calc Af Amer: 59 mL/min — ABNORMAL LOW (ref >60)
GFR calc Af Amer: 58 mL/min — ABNORMAL LOW (ref >60)
GFR calc non Af Amer: 48 mL/min — ABNORMAL LOW (ref >60)
Glucose, Bld: 203 mg/dL — ABNORMAL HIGH (ref 70–99)
Sodium: 134 mEq/L — ABNORMAL LOW (ref 135–145)
Sodium: 135 mEq/L (ref 135–145)

## 2010-05-06 LAB — VITAMIN D 25 HYDROXY (VIT D DEFICIENCY, FRACTURES): Vit D, 25-Hydroxy: 29 ng/mL — ABNORMAL LOW (ref 30–89)

## 2010-05-06 LAB — HEPARIN LEVEL (UNFRACTIONATED): Heparin Unfractionated: <0.1 IU/mL — ABNORMAL LOW (ref 0.30–0.70)

## 2010-05-06 LAB — MAGNESIUM: Magnesium: 2 mg/dL (ref 1.5–2.5)

## 2010-05-07 ENCOUNTER — Encounter: Payer: Self-pay | Admitting: Internal Medicine

## 2010-05-07 LAB — BASIC METABOLIC PANEL
BUN: 22 mg/dL (ref 6–23)
CO2: 29 mEq/L (ref 19–32)
Calcium: 9 mg/dL (ref 8.4–10.5)
Calcium: 8.4 mg/dL (ref 8.4–10.5)
Chloride: 100 mEq/L (ref 96–112)
GFR calc Af Amer: 52 mL/min — ABNORMAL LOW (ref >60)
GFR calc non Af Amer: 40 mL/min — ABNORMAL LOW (ref >60)
Glucose, Bld: 140 mg/dL — ABNORMAL HIGH (ref 70–99)
Potassium: 5 mEq/L (ref 3.5–5.1)
Sodium: 139 mEq/L (ref 135–145)
Sodium: 136 mEq/L (ref 135–145)

## 2010-05-07 LAB — PROTIME-INR
INR: 2.12 — ABNORMAL HIGH (ref 0.00–1.49)
Prothrombin Time: 23.9 seconds — ABNORMAL HIGH (ref 11.6–15.2)

## 2010-05-07 LAB — MAGNESIUM: Magnesium: 2.3 mg/dL (ref 1.5–2.5)

## 2010-05-07 LAB — CBC
Hemoglobin: 11.3 g/dL — ABNORMAL LOW (ref 12.0–15.0)
MCH: 26.6 pg (ref 26.0–34.0)
MCV: 82.8 fL (ref 78.0–100.0)
Platelets: 240 10*3/uL (ref 150–400)
Platelets: 225 10*3/uL (ref 150–400)
RBC: 4.28 MIL/uL (ref 3.87–5.11)
RBC: 3.84 MIL/uL — ABNORMAL LOW (ref 3.87–5.11)
RDW: 16.1 % — ABNORMAL HIGH (ref 11.5–15.5)
WBC: 8.1 10*3/uL (ref 4.0–10.5)

## 2010-05-07 LAB — GLUCOSE, CAPILLARY
Glucose-Capillary: 154 mg/dL — ABNORMAL HIGH (ref 70–99)
Glucose-Capillary: 271 mg/dL — ABNORMAL HIGH (ref 70–99)
Glucose-Capillary: 179 mg/dL — ABNORMAL HIGH (ref 70–99)
Glucose-Capillary: 255 mg/dL — ABNORMAL HIGH (ref 70–99)
Glucose-Capillary: 225 mg/dL — ABNORMAL HIGH (ref 70–99)

## 2010-05-07 LAB — BRAIN NATRIURETIC PEPTIDE: Pro B Natriuretic peptide (BNP): 990 pg/mL — ABNORMAL HIGH (ref 0.0–100.0)

## 2010-05-08 LAB — CBC
MCH: 26.5 pg (ref 26.0–34.0)
MCHC: 31.9 g/dL (ref 30.0–36.0)
MCV: 83 fL (ref 78.0–100.0)
Platelets: 229 10*3/uL (ref 150–400)
RBC: 3.89 MIL/uL (ref 3.87–5.11)
RDW: 16.2 % — ABNORMAL HIGH (ref 11.5–15.5)

## 2010-05-08 LAB — GLUCOSE, CAPILLARY

## 2010-05-08 LAB — PROTIME-INR: Prothrombin Time: 29.7 seconds — ABNORMAL HIGH (ref 11.6–15.2)

## 2010-05-08 LAB — POCT ACTIVATED CLOTTING TIME: Activated Clotting Time: 104 seconds

## 2010-05-08 NOTE — Procedures (Addendum)
  NAME:  [name], [name] NO.:  [id number]  MEDICAL RECORD NO.:  [id number]          PATIENT TYPE:  INP  LOCATION:  2908                         FACILITY:  [facility]  PHYSICIAN:  Thereasa Solo. Little, M.D. DATE OF BIRTH:  1950/08/01  DATE OF PROCEDURE:  05/01/2010 DATE OF DISCHARGE:                           CARDIAC CATHETERIZATION   INDICATIONS FOR TEST:  This 60 year old female has a long history of paroxysmal atrial fibrillation and had 2 ablations performed by Dr. Sampson Goon at [facility].  Despite this, she continued to have episodes of PAF and was admitted after an episode of 6 weeks of shortness of breath with cough, was in atrial fibrillation and had VT/VF arrest.  She had been on Tikosyn and Cardizem with prolonged QT interval.  This was all stopped.  Her ejection fraction by echo had dropped to 35% and she is brought to the cath lab to make sure she does not have underlying coronary disease.  After obtaining informed consent, the patient was prepped and draped in the usual sterile fashion exposing the right groin.  Following local anesthetic with 1% Xylocaine, the Seldinger technique was employed, and a 5-French introducer sheath was placed in the right femoral artery. Left and right coronary arteriography and ventriculography in the RAO projection was performed.  COMPLICATIONS:  None.  TOTAL CONTRAST USED:  60 mL.  EQUIPMENT:  5-French Judkins figuration catheters.  RESULTS: 1. Hemodynamic monitoring:  Central aortic pressure was 118/71.  Left     ventricular pressure was 110/23.  There was no significant gradient     noted across the aortic valve at the time of pullback. 2. Ventriculography.  Ventriculography in the RAO projection using 25     mL of contrast revealed the left ventricle to be severely dilated     and severely hypokinetic globally.  The ejection fraction was 15-     20%.  The end-diastolic pressure was 30.  There was +1 mitral  regurgitation. 3. Coronary arteriography. 4. Left main normal. 5. Circumflex.  Circumflex was a codominant vessel free of disease. 6. LAD.  The LAD extended down to the apex of the heart.  It was free     of disease.  There was a small diagonal branch.  It was free of     disease. 7. Right coronary artery and PDA was free of disease.  CONCLUSION: 1. No occlusive coronary disease. 2. Severe LV dysfunction with ejection fraction of 15-20%.  This is clearly consistent with a nonischemic cardiomyopathy.  She will need an AICD.  She has a narrow QRS, so biventricular wise, it is probably not a option.          ______________________________ Thereasa Solo Little, M.D.     ABL/MEDQ  D:  05/01/2010  T:  05/02/2010  Job:  [id number]  cc:   Dr. Corlis Hove, MD, [facility]  Electronically Signed by Julieanne Manson M.D. on 05/08/2010 04:01:47 PM

## 2010-05-09 ENCOUNTER — Inpatient Hospital Stay (HOSPITAL_COMMUNITY)
Admission: AD | Admit: 2010-05-09 | Discharge: 2010-05-13 | Disposition: A | Discharge status: Home / Self Care | Payer: Self-pay | Source: Home / Self Care | Attending: Family Medicine | Admitting: Family Medicine

## 2010-05-09 ENCOUNTER — Ambulatory Visit
Admission: RE | Admit: 2010-05-09 | Discharge: 2010-05-09 | Payer: Self-pay | Source: Home / Self Care | Attending: Family Medicine | Admitting: Family Medicine

## 2010-05-09 ENCOUNTER — Telehealth: Payer: Self-pay | Admitting: *Deleted

## 2010-05-09 DIAGNOSIS — I5022 Chronic systolic (congestive) heart failure: Secondary | ICD-10-CM | POA: Insufficient documentation

## 2010-05-09 LAB — BRAIN NATRIURETIC PEPTIDE: Pro B Natriuretic peptide (BNP): 1463 pg/mL — ABNORMAL HIGH (ref 0.0–100.0)

## 2010-05-09 LAB — CBC
HCT: 34.1 % — ABNORMAL LOW (ref 36.0–46.0)
Hemoglobin: 10.9 g/dL — ABNORMAL LOW (ref 12.0–15.0)
RBC: 4.19 MIL/uL (ref 3.87–5.11)
RDW: 15.9 % — ABNORMAL HIGH (ref 11.5–15.5)
WBC: 7.3 10*3/uL (ref 4.0–10.5)

## 2010-05-09 LAB — CARDIAC PANEL(CRET KIN+CKTOT+MB+TROPI): Relative Index: INVALID (ref 0.0–2.5)

## 2010-05-09 LAB — MRSA PCR SCREENING: MRSA by PCR: NEGATIVE

## 2010-05-09 LAB — BASIC METABOLIC PANEL
BUN: 22 mg/dL (ref 6–23)
Calcium: 8.9 mg/dL (ref 8.4–10.5)
GFR calc Af Amer: 43 mL/min — ABNORMAL LOW (ref >60)
GFR calc non Af Amer: 36 mL/min — ABNORMAL LOW (ref >60)
Glucose, Bld: 199 mg/dL — ABNORMAL HIGH (ref 70–99)

## 2010-05-09 LAB — PROTIME-INR: INR: 2.34 — ABNORMAL HIGH (ref 0.00–1.49)

## 2010-05-09 LAB — GLUCOSE, CAPILLARY

## 2010-05-10 DIAGNOSIS — I509 Heart failure, unspecified: Secondary | ICD-10-CM

## 2010-05-10 LAB — BASIC METABOLIC PANEL
BUN: 22 mg/dL (ref 6–23)
CO2: 28 mEq/L (ref 19–32)
Calcium: 8.3 mg/dL — ABNORMAL LOW (ref 8.4–10.5)
Chloride: 101 mEq/L (ref 96–112)
Creatinine, Ser: 1.4 mg/dL — ABNORMAL HIGH (ref 0.4–1.2)
GFR calc Af Amer: 47 mL/min — ABNORMAL LOW (ref >60)
GFR calc non Af Amer: 39 mL/min — ABNORMAL LOW (ref >60)
Glucose, Bld: 147 mg/dL — ABNORMAL HIGH (ref 70–99)
Potassium: 4.3 mEq/L (ref 3.5–5.1)
Sodium: 134 mEq/L — ABNORMAL LOW (ref 135–145)

## 2010-05-10 LAB — GLUCOSE, CAPILLARY
Glucose-Capillary: 155 mg/dL — ABNORMAL HIGH (ref 70–99)
Glucose-Capillary: 64 mg/dL — ABNORMAL LOW (ref 70–99)

## 2010-05-10 LAB — CARDIAC PANEL(CRET KIN+CKTOT+MB+TROPI)
CK, MB: 0.9 ng/mL (ref 0.3–4.0)
Relative Index: INVALID (ref 0.0–2.5)
Total CK: 37 U/L (ref 7–177)
Troponin I: 0.02 ng/mL (ref 0.00–0.06)

## 2010-05-10 LAB — PROTIME-INR
INR: 2.21 — ABNORMAL HIGH (ref 0.00–1.49)
Prothrombin Time: 24.7 seconds — ABNORMAL HIGH (ref 11.6–15.2)

## 2010-05-11 LAB — CBC
Hemoglobin: 10.5 g/dL — ABNORMAL LOW (ref 12.0–15.0)
MCH: 26.4 pg (ref 26.0–34.0)
Platelets: 177 10*3/uL (ref 150–400)
RBC: 3.98 MIL/uL (ref 3.87–5.11)
WBC: 6 10*3/uL (ref 4.0–10.5)

## 2010-05-11 LAB — GLUCOSE, CAPILLARY
Glucose-Capillary: 130 mg/dL — ABNORMAL HIGH (ref 70–99)
Glucose-Capillary: 161 mg/dL — ABNORMAL HIGH (ref 70–99)
Glucose-Capillary: 155 mg/dL — ABNORMAL HIGH (ref 70–99)

## 2010-05-11 LAB — PROTIME-INR
INR: 2.12 — ABNORMAL HIGH (ref 0.00–1.49)
Prothrombin Time: 23.9 seconds — ABNORMAL HIGH (ref 11.6–15.2)

## 2010-05-11 LAB — BASIC METABOLIC PANEL
Chloride: 94 mEq/L — ABNORMAL LOW (ref 96–112)
Creatinine, Ser: 1.53 mg/dL — ABNORMAL HIGH (ref 0.4–1.2)
GFR calc Af Amer: 42 mL/min — ABNORMAL LOW (ref >60)
Sodium: 135 mEq/L (ref 135–145)

## 2010-05-11 LAB — BRAIN NATRIURETIC PEPTIDE: Pro B Natriuretic peptide (BNP): 811 pg/mL — ABNORMAL HIGH (ref 0.0–100.0)

## 2010-05-11 LAB — MAGNESIUM: Magnesium: 1.9 mg/dL (ref 1.5–2.5)

## 2010-05-12 ENCOUNTER — Ambulatory Visit: Admit: 2010-05-12 | Payer: Self-pay | Admitting: Internal Medicine

## 2010-05-12 ENCOUNTER — Ambulatory Visit: Admit: 2010-05-12 | Payer: Self-pay

## 2010-05-12 ENCOUNTER — Telehealth: Payer: Self-pay | Admitting: *Deleted

## 2010-05-12 LAB — GLUCOSE, CAPILLARY
Glucose-Capillary: 133 mg/dL — ABNORMAL HIGH (ref 70–99)
Glucose-Capillary: 171 mg/dL — ABNORMAL HIGH (ref 70–99)

## 2010-05-12 LAB — BASIC METABOLIC PANEL
CO2: 30 mEq/L (ref 19–32)
Calcium: 8.4 mg/dL (ref 8.4–10.5)
Creatinine, Ser: 1.19 mg/dL (ref 0.4–1.2)
GFR calc Af Amer: 56 mL/min — ABNORMAL LOW (ref >60)
GFR calc non Af Amer: 47 mL/min — ABNORMAL LOW (ref >60)
Glucose, Bld: 125 mg/dL — ABNORMAL HIGH (ref 70–99)

## 2010-05-12 LAB — PROTIME-INR: Prothrombin Time: 24.8 seconds — ABNORMAL HIGH (ref 11.6–15.2)

## 2010-05-13 LAB — PROTIME-INR: Prothrombin Time: 24.3 seconds — ABNORMAL HIGH (ref 11.6–15.2)

## 2010-05-13 LAB — GLUCOSE, CAPILLARY: Glucose-Capillary: 97 mg/dL (ref 70–99)

## 2010-05-13 NOTE — Progress Notes (Signed)
Summary: cough/ wheezing  Phone Note Call from Patient Call back at [facility] Phone [phone]   Caller: Patient Call For: [name] Summary of Call: pt c/o wheezing and coughing "'especially at night" . she was seen by dr Delford Field 02/14/10. pls advise. [facility] [address] Initial call taken by: Tivis Ringer, CNA,  March 03, 2010 12:56 PM  Follow-up for Phone Call        Called and spoke with pt.  She is c/o increased wheezing and cough at night.  She states that her cough is prod with thick, white sputum.  She states that her chest "rattles" when she lies down.  She has also noticed slight increase in her SOB since last seen on 02/14/10.  There are no openings in the sched today.  Pls advise thanks! Follow-up by: Vernie Murders,  March 03, 2010 1:58 PM  Additional Follow-up for Phone Call Additional follow up Details #1::        she needs an OV or go to pcp MD Additional Follow-up by: Storm Frisk MD,  March 03, 2010 2:17 PM    Additional Follow-up for Phone Call Additional follow up Details #2::    Spoke with pt and sched her to see TP this pm at 3 Follow-up by: Vernie Murders,  March 03, 2010 2:21 PM

## 2010-05-13 NOTE — Progress Notes (Signed)
Summary: wi request   Phone Note Call from Patient Call back at Home Phone [phone]   Reason for Call: Talk to Nurse Summary of Call: requesting wi appt for cramps in her legs Initial call taken by: Knox Royalty,  October 22, 2009 11:06 AM  Follow-up for Phone Call        hx of leg cramps. L leg hurts worse. worse at night. located above knees.  has not seen md for this before. used heating pad w/o relief. tylenol helps a little but not enough. appt today at 3 with Dr. Ashley Royalty Follow-up by: Golden Circle RN,  October 22, 2009 11:20 AM

## 2010-05-13 NOTE — Miscellaneous (Signed)
Summary: Report from [facility]   Clinical Lists Changes  Observations: Added new observation of PAST SURG HX: Colonoscopy 06/2009: Pandiverticulosis. Dr Loreta Ave s/p pulmonary vein isolation 11/08/08 at [facility] ABI = 1.99 - 03/31/2001,  Cardiac cath - 08/11/2005, Cardiac Cath- N Ef, mild CAD - 04/13/2002,  Cardiolite - scar at apical/lat wall, pos ischemia ant/anteroseptal wall - 07/20/2005,  Cholecystectomy -,  Flex sigmoid - nl - 06/02/2005,  Nissen Fundoplication 1/98 -,  R Breast Bx - sclerosing adenosis 5/93 -,  R Knee Arthroscopy - 10/12/1998, Sleep study -[facility] - 04/13/2004 (07/11/2009 4:35)       Past History:  Past Surgical History: Colonoscopy 06/2009: Pandiverticulosis. Dr Loreta Ave s/p pulmonary vein isolation 11/08/08 at [facility] ABI = 1.99 - 03/31/2001,  Cardiac cath - 08/11/2005, Cardiac Cath- N Ef, mild CAD - 04/13/2002,  Cardiolite - scar at apical/lat wall, pos ischemia ant/anteroseptal wall - 07/20/2005,  Cholecystectomy -,  Flex sigmoid - nl - 06/02/2005,  Nissen Fundoplication 1/98 -,  R Breast Bx - sclerosing adenosis 5/93 -,  R Knee Arthroscopy - 10/12/1998, Sleep study -[facility] - 04/13/2004

## 2010-05-13 NOTE — Assessment & Plan Note (Signed)
Summary: fu Diabetes   Vital Signs:  Patient profile:   60 year old female Height:      67 inches Weight:      216 pounds BMI:     33.95 BSA:     2.09 Temp:     97.9 degrees F Pulse rate:   69 / minute BP sitting:   117 / 71  Vitals Entered By: [name] CMA (June 19, 2009 8:55 AM) CC: f/u Is Patient Diabetic? Yes Did you bring your meter with you today? No Pain Assessment Patient in pain? no        Primary Care Provider:  Anaisabel Pederson MD  CC:  f/u.  History of Present Illness: cc: f/u diabetes  60 y/o F here for f/u of diabetes  DIABETES Meds: metformin 500mg  in AM, 1000 in PM and Glucotrol 5mg   Med compliance: yes   DIET: carrots, brocoli, fruits, mixed vegetables, no fried foods, no salt Lightheadedness  no Dizziness no    Confusion no   Shakiness  no  Abd  pain no  Nausea no  Vomiting no Two hypoglycemic events in the afternoon with CBG 71, 68: no syncope, pt "felt funny" pt drank OJ and felt fine.    Exercise: stationary bike 10-15 minutes/3x/wk    Habits & Providers  Alcohol-Tobacco-Diet     Tobacco Status: never  Allergies: 1)  [name] Inhibitors  Past History:  Past Medical History: Last updated: 04/08/2009 UPDATED 10/30/2008 after review of old paper chart dating back to late 1990s  H. pylori negative 07/2001, Menopause at 60 y/o, on CPAP at night, sinus bradycardia with arrythmia noted on 01/21/01  #DISEASE, VOCAL CORD NEC (ICD-478.5)............[name] >mentioned in chart > Was followed by Diane. Annalee [name] ENT in late 1990s - lary showed findings c/w GERD > Was also followed by Speech Clinic at [facility]. VCD suspected there. s/p speech therap in late 1990s  ACEi Intolerant OBESITY, NOS (ICD-278.00) LEG PAIN OR KNEE PAIN (ICD-729.5) HYPERTENSION, BENIGN SYSTEMIC (ICD-401.1) HYPERCHOLESTEROLEMIA (ICD-272.0) GASTROESOPHAGEAL REFLUX, NO ESOPHAGITIS (ICD-530.81) DIABETES MELLITUS II, UNCOMPLICATED (ICD-250.00) DEPRESSION, MAJOR, RECURRENT  (ICD-296.30) ATRIAL FIBRILLATION (ICD-427.31)................[facility] [name] >PAF. s/p ablation >s/p pulmonary vein isolation 11/08/08 ASTHMA, UNSPECIFIED (ICD-493.90)....................[name] >charts mention "moderare persistent" severity.  >Last PFT was late 1990s per patient hx but in review  of paper chart [name] could not find it july 2010 >Frequent exacerbagtions needing admits late 1990s >Positive skin testing for allergies - 1998 by [name] >Normal spirometry on advair/singulair - july 2010 while symptomatic  #APNEA, SLEEP (ICD-780.57)....Diane. Vassie [name]  Past Surgical History: Last updated: 02/16/2009 s/p pulmonary vein isolation 11/08/08 at [facility] ABI = 1.99 - 03/31/2001,  Cardiac cath - 08/11/2005, Cardiac Cath- N Ef, mild CAD - 04/13/2002,  Cardiolite - scar at apical/lat wall, pos ischemia ant/anteroseptal wall - 07/20/2005,  Cholecystectomy -,  Flex sigmoid - nl - 06/02/2005,  Nissen Fundoplication 1/98 -,  R Breast Bx - sclerosing adenosis 5/93 -,  R Knee Arthroscopy - 10/12/1998, Sleep study -[facility] - 04/13/2004  Family History: Last updated: 06/30/2006 Father is deceased at 56 yo with DM and HTN., Mom died during a hysterectomy d/t a bleeding complication.  Social History: Last updated: Jun 30, 2006 Husband died 2022/12/05, lives w/ Daughter-[name]. Nonsmoker, doesn't drink.  Involved in non-denominational [employer]. Not working  Physical Exam  General:  Well-developed,well-nourished,in no acute distress; alert,appropriate and cooperative throughout examination. vitals reviewed.  Lungs:  Normal respiratory effort, chest expands symmetrically. Lungs are clear to auscultation, no  crackles or wheezes. Heart:  Normal rate and regular rhythm. S1 and S2 normal without gallop, murmur, click, rub or other extra sounds. Abdomen:  Bowel sounds positive,abdomen soft and non-tender without masses, organomegaly or hernias noted. Extremities:  trace left pedal edema.   trace left pedal edema.   Neurologic:  alert & oriented X3.  alert & oriented X3.     Impression & Recommendations:  Problem # 1:  DIABETES MELLITUS II, UNCOMPLICATED (ICD-250.00) Assessment Improved A1C improved today from 7.7 to 7.4.  Pt still cannot tolerate taking 1000mg  metformin in AM.  I intended to increase glucotrol to 10mg  two times a day with meals, but given 2 hypoglycemic events I did not.  We discussed eating regular meals since she is on glucotrol.  Pt states that she sometimes don't eat regularly.  Pt to continue exercising, but to increse from 10-15 minutes to 20 min 3x/wk.  Pt to rtc in 3 months.   Her updated medication list for this problem includes:    Cozaar 50 Mg Tabs (Losartan potassium) .Marland Kitchen... 1 tablet  by mouth daily daily    Glucophage 1000 Mg Tabs (Metformin hcl) ..... One-half  tablet by mouth in am and full tablet by mouth in pm    Glucotrol 5 Mg Tabs (Glipizide) ..... One tablet by mouth twice a day for diabetes  Orders: A1C-[facility] (09811) [facility]- Est Level  3 (91478)  Complete Medication List: 1)  Advair Diskus 250-50 Mcg/dose Misc (Fluticasone-salmeterol) .... Inhale 1 puff as directed twice a day 2)  Allegra 180 Mg Tabs (Fexofenadine hcl) .... Take 1 tablet by mouth once a day 3)  Cozaar 50 Mg Tabs (Losartan potassium) .Marland Kitchen.. 1 tablet  by mouth daily daily 4)  Coumadin 5 Mg Tabs (Warfarin sodium) .... Take as directed 5)  Flonase 50 Mcg/act Susp (Fluticasone propionate) .... Spray 1 spray into both nostrils twice a day 6)  Nexium 40 Mg Cpdr (Esomeprazole magnesium) .... One by mouth daily 7)  Singulair 10 Mg Tabs (Montelukast sodium) .... Take 1 tablet by mouth once a day per [name] 8)  Xopenex Hfa 45 Mcg/act Aero (Levalbuterol tartrate) .... Inhale 2 puff using inhaler every four hours per [name] 9)  Tikosyn Caps (dofetilide)  .... One by mouth two times a day per Diane [name] 10)  Cpap 11cmh20 Qhs  11)  Xopenex 0.63 Mg/80ml Nebu (Levalbuterol hcl)  .... One in nebulizer four times daily as needed per [name] 12)  Simvastatin 20 Mg Tabs (Simvastatin) .Marland Kitchen.. 1 by mouth daily 13)  Diabetic Shoes  .... Wear regularly 14)  Glucophage 1000 Mg Tabs (Metformin hcl) .... One-half  tablet by mouth in am and full tablet by mouth in pm 15)  Glucotrol 5 Mg Tabs (Glipizide) .... One tablet by mouth twice a day for diabetes 16)  Lasix 40 Mg  .Marland Kitchen.. 1 tab 3 times per week Diane little 17)  Diltiazem Hcl Er Beads 300 Mg Xr24h-cap (Diltiazem hcl er beads) .... Once daily 18)  Hydrochlorothiazide 25 Mg Tabs (Hydrochlorothiazide) .... 1/2 tab by mouth daily for blood pressure 19)  Furosemide 40 Mg Tabs (Furosemide) .... Take one three times a day as needed  Patient Instructions: 1)  Please schedule a follow-up appointment in 3 months for Diabetes. 2)  Your A1C is better today.  It is 7.4.  3)  Continue your vegetables, fruits and exercise. 4)  Make sure you eat regular meals.  NO skipping meals.  Laboratory Results   Blood  Tests   Date/Time Received: June 19, 2009 8:52 AM  Date/Time Reported: June 19, 2009 9:12 AM   HGBA1C: 7.4%   (Normal Range: Non-Diabetic - 3-6%   Control Diabetic - 6-8%)  Comments: ...............test performed by......Marland KitchenBonnie A. Swaz[place], MLS (ASCP)cm       Prevention & Chronic Care Immunizations   Influenza vaccine: Fluvax MCR  (01/21/2009)   Influenza vaccine due: 01/08/2009    Tetanus booster: 01/11/2001: Done.   Tetanus booster due: 01/12/2011    Pneumococcal vaccine: Done.  (01/11/2001)   Pneumococcal vaccine due: None  Colorectal Screening   Hemoccult: Done.  (03/18/2005)   Hemoccult due: Not Indicated    Colonoscopy: Not documented   Colonoscopy action/deferral: GI referral  (05/07/2009)   Colonoscopy due: 12/07/2010  Other Screening   Pap smear: normal  (05/13/2007)   Pap smear action/deferral: Deferred-3 yr interval  (04/08/2009)   Pap smear due: 05/12/2010    Mammogram: ASSESSMENT: Negative -  BI-RADS 1^MM DIGITAL SCREENING  (03/27/2009)   Mammogram due: 03/27/2010   Smoking status: never  (06/19/2009)  Diabetes Mellitus   HgbA1C: 7.4  (06/19/2009)   Hemoglobin A1C due: 03/08/2008    Eye exam: normal  (08/13/2008)   Eye exam due: 08/2009    Foot exam: yes  (05/07/2009)   High risk foot: Not documented   Foot care education: Not documented   Foot exam due: 06/27/2008    Urine microalbumin/creatinine ratio: Not documented   Urine microalbumin/cr due: 12/06/2008    Diabetes flowsheet reviewed?: Yes   Progress toward A1C goal: At goal  Lipids   Total Cholesterol: 140  (12/27/2008)   LDL: 80  (12/27/2008)   LDL Direct: 94  (12/07/2007)   HDL: 52  (12/27/2008)   Triglycerides: 41  (12/27/2008)    SGOT (AST): 15  (04/30/2009)   SGPT (ALT): 18  (04/30/2009)   Alkaline phosphatase: 83  (04/30/2009)   Total bilirubin: 0.2  (04/30/2009)    Lipid flowsheet reviewed?: Yes   Progress toward LDL goal: At goal  Hypertension   Last Blood Pressure: 117 / 71  (06/19/2009)   Serum creatinine: 0.83  (04/30/2009)   Serum potassium 4.0  (04/30/2009)    Hypertension flowsheet reviewed?: Yes   Progress toward BP goal: At goal  Self-Management Support :   Personal Goals (by the next clinic visit) :     Personal A1C goal: 8  (06/19/2009)     Personal blood pressure goal: 140/90  (05/07/2009)     Personal LDL goal: 100  (04/08/2009)    Diabetes self-management support: Written self-care plan, Referred for medical nutrition therapy  (06/19/2009)   Diabetes care plan printed    Hypertension self-management support: Written self-care plan, Referred for medical nutrition therapy  (06/19/2009)   Hypertension self-care plan printed.    Lipid self-management support: Written self-care plan, Referred for medical nutrition therapy  (06/19/2009)   Lipid self-care plan printed.   Nursing Instructions: Refer for medical nutrition therapy (see order)    Diabetes Self Management  Training Referral Patient Name: [name] Date Of Birth: 1950/07/02 MRN: [id number] Current Diagnosis:  LEG PAIN, BILATERAL (ICD-729.5) DIABETES MELLITUS II, UNCOMPLICATED (ICD-250.00) HYPERTENSION, BENIGN SYSTEMIC (ICD-401.1) HYPERCHOLESTEROLEMIA (ICD-272.0) OBESITY, NOS (ICD-278.00) APNEA, SLEEP (ICD-780.57) ATRIAL FIBRILLATION (ICD-427.31) OSTEOARTHRITIS, KNEE (ICD-715.96) ASTHMA, UNSPECIFIED (ICD-493.90) DEPRESSION, MAJOR, RECURRENT (ICD-296.30) GASTROESOPHAGEAL REFLUX, NO ESOPHAGITIS (ICD-530.81) DISEASE, VOCAL CORD NEC (ICD-478.5) ENCOUNTER FOR LONG-TERM USE OF ANTICOAGULANTS (ICD-V58.61)

## 2010-05-13 NOTE — Progress Notes (Signed)
Summary: sub for allegra  Phone Note Call from Patient Call back at Home Phone [phone]   Caller: Patient Call For: wright Summary of Call: pt states tha [facility] is no longer coverd under her ins. please advise re: [facility] [address] # Initial call taken by: Tivis Ringer, CNA,  October 31, 2009 12:46 PM  Follow-up for Phone Call        called and spoke with pt---she stated that the allegra is no longer covered under her insurance and was not told what they would cover---please advise of another med to replace the allegra.  thanks Randell Loop CMA  October 31, 2009 3:27 PM   Additional Follow-up for Phone Call Additional follow up Details #1::        try loratidine 10mg  daily Additional Follow-up by: Storm Frisk MD,  October 31, 2009 4:08 PM    Additional Follow-up for Phone Call Additional follow up Details #2::    called spoke with patient.  advised of PEW's  recs.  pt verbalized her understanding.  [facility] [address] Follow-up by: Boone Master CNA/MA,  October 31, 2009 4:25 PM  New/Updated Medications: LORATADINE 10 MG TABS (LORATADINE) Take 1 tablet by mouth once a day Prescriptions: LORATADINE 10 MG TABS (LORATADINE) Take 1 tablet by mouth once a day  #30 x 6   Entered by:   Boone Master CNA/MA   Authorized by:   Storm Frisk MD   Signed by:   Boone Master CNA/MA on 10/31/2009   Method used:   [facility]  [address] [id number]       [address]       [place], [place]  [address]       Ph: [phone] or [phone]       Fax: [phone]   RxID:   [id number]

## 2010-05-13 NOTE — Progress Notes (Signed)
Summary: triage   Phone Note Call from Patient Call back at Home Phone [phone]   Caller: Patient Summary of Call: Pt given Gabapentin and she is having a problem with it.  Initial call taken by: Clydell Hakim,  May 13, 2009 9:43 AM  Follow-up for Phone Call        it makes her very sleepy. not able to function due to tiredness. started last friday.  has not taken any today. states she has stayed in bed over the weekend due to the sleepiness this med causes. [facility] to [facility] [address]  Follow-up by: Golden Circle RN,  May 13, 2009 9:46 AM    Please tell pt that gabapentin can cause somnolence.  Pt can stop taking it.  She can see me in clinic for alteranative.  Romy Mcgue MD  May 13, 2009 9:22 PM  Appended Document: triage appt made with pcp tomorrow at 10am

## 2010-05-13 NOTE — Miscellaneous (Signed)
Summary: ABX RX  Medications Added DOXYCYCLINE MONOHYDRATE 100 MG  CAPS (DOXYCYCLINE MONOHYDRATE) By mouth twice daily       Clinical Lists Changes  Medications: Added new medication of DOXYCYCLINE MONOHYDRATE 100 MG  CAPS (DOXYCYCLINE MONOHYDRATE) By mouth twice daily - Signed Rx of DOXYCYCLINE MONOHYDRATE 100 MG  CAPS (DOXYCYCLINE MONOHYDRATE) By mouth twice daily;  #14 x 0;  Signed;  Entered by: Storm Frisk MD;  Authorized by: Storm Frisk MD;  Method used: [facility]  [address] [id number] [address] [place], [place], [place]  [address], Ph: [phone] or [phone], Fax: [phone]    Prescriptions: DOXYCYCLINE MONOHYDRATE 100 MG  CAPS (DOXYCYCLINE MONOHYDRATE) By mouth twice daily  #14 x 0   Entered and Authorized by:   Storm Frisk MD   Signed by:   Storm Frisk MD on 03/10/2010   Method used:   [facility]        [facility]  [address] [id number]       [address]       [place], [place]  [address]       Ph: [phone] or [phone]       Fax: [phone]   RxID:   [id number]

## 2010-05-13 NOTE — Progress Notes (Signed)
Summary: Stopping HCTZ (interaction with Tikosyn)   Phone Note Outgoing Call   Call placed by: Cat Ta MD,  January 27, 2010 9:32 AM Call placed to: Specialist Summary of Call: Called [facility] [phone].  Pt sees Dr Clarene Duke.   I received fax from pt's pharmacy regarding drug interaction between Tikosyn and HTCZ: Level 1 Contraindication --> prolonged QT.  Pt is also on Lasix 3x/week.  Dr Clarene Duke feels it is ok to d/c HCTZ, cont on Lasix.  We need to periodically check K level, as it can become low and potential prolonged QT.  Last K was 3.9 in 12/2009.  Initial call taken by: Cat Ta MD,  January 27, 2010 9:41 AM  Follow-up for Phone Call        [phone].  Spoke with pharmacist. We will stop HCTZ.  They will inactivate this in her profile so that it cannot be refilled.   Thanked them for sending fax notice of this.   Follow-up by: Angeline Slim MD,  January 27, 2010 9:41 AM  Additional Follow-up for Phone Call Additional follow up Details #1::        Called pt.  Advised pt re my conversation with Dr Clarene Duke.  Advised her to stop taking HCTZ, cont lasix and Tikosyn.  She voiced understanding.  Advised pt to make appt with me in 2-3 wks.  We will recheck bmet (K) at that time.   Additional Follow-up by: Cat Ta MD,  January 27, 2010 9:43 AM

## 2010-05-13 NOTE — Assessment & Plan Note (Signed)
Summary: Pulmonary OV   Copy to:  Dr. Shan Levans Primary Provider/Referring Provider:  Cat Ta MD  CC:  2 wk follow up.  Pt states breathing has improved and does see improvement with Qvar.  Denies SOB, wheezing, chest tightness, and cough.  No complaints. .  History of Present Illness: 60  year-old, African-American  female, history of moderate persistent asthma and reflux disease.  Hx of VCD and cyclic cough.  Hx GERD  October 15, 2009 10:00 AM The pt is better vs May OV with cough.  The  dyspnea is better and is sleeping better.  No new issues.   November 19, 2009--Presents for an acute office visit. Complains of wheezing, increased SOB, dry cough, chest congestion onset last night. Symptoms started last night, worse this am. Cough is dry, no discolored mucus. Cough is aggravating. Denies chest pain,  orthopnea, hemoptysis, fever, n/v/d, edema, headache.   December 09, 2009 9:20 AM The pt had another flare up of VCD and bronchitis on  11/19/09 and saw NP.   The pt was rx with  qvar and dexilant.  The pt is now is better.  The pt is holding advair at present. Pt denies any significant sore throat, nasal congestion or excess secretions, fever, chills, sweats, unintended weight loss, pleurtic or exertional chest pain, orthopnea PND, or leg swelling Pt denies any increase in rescue therapy over baseline, denies waking up needing it or having any early am or nocturnal exacerbations of coughing/wheezing/or dyspnea.   Asthma History    Asthma Control Assessment:    Age range: 12+ years    Symptoms: 0-2 days/week    Nighttime Awakenings: 0-2/month    Interferes w/ normal activity: no limitations    SABA use (not for EIB): 0-2 days/week    ATAQ questionnaire: 0    FEV1: 2.16 liters (today)    Exacerbations requiring oral systemic steroids: 0-1/year    Asthma Control Assessment: Well Controlled   Preventive Screening-Counseling & Management  Alcohol-Tobacco     Alcohol drinks/day: 0  Smoking Status: never     Year Quit: 1980     Pack years: 10     Passive Smoke Exposure: no  Current Medications (verified): 1)  Advair Diskus 250-50 Mcg/dose Aepb (Fluticasone-Salmeterol) .... Inhale 1 Puff Two Times A Day 2)  Loratadine 10 Mg Tabs (Loratadine) .... Take 1 Tablet By Mouth Once A Day 3)  Cozaar 50 Mg Tabs (Losartan Potassium) .Marland Kitchen.. 1 Tablet  By Mouth Daily Daily 4)  Coumadin 5 Mg Tabs (Warfarin Sodium) .... Take As Directed 5)  Flonase 50 Mcg/act Susp (Fluticasone Propionate) .... Spray 1 Spray Into Both Nostrils Twice A Day 6)  Nexium 40 Mg Cpdr (Esomeprazole Magnesium) .... One By Mouth Daily 7)  Singulair 10 Mg Tabs (Montelukast Sodium) .... Take 1 Tablet By Mouth Once A Day Per Dr Delford Field 8)  Xopenex Hfa 45 Mcg/act Aero (Levalbuterol Tartrate) .... Inhale 2 Puff Using Inhaler Every Four Hours As Needed 9)  Tikosyn  Caps (Dofetilide) .... One By Mouth Two Times A Day Per Dr Sampson Goon 10)  Cpap 11cmh20 Qhs 11)  Xopenex 0.63 Mg/72ml  Nebu (Levalbuterol Hcl) .... One in Nebulizer Four Times Daily As Needed Per Dr Delford Field 12)  Pravastatin Sodium 20 Mg Tabs (Pravastatin Sodium) .Marland Kitchen.. 1 Once Daily 13)  Diabetic Shoes .... Wear Regularly 14)  Glucophage 1000 Mg Tabs (Metformin Hcl) .... One-Half  Tablet By Mouth in Am and Full Tablet By Mouth in Pm 15)  Glucotrol 5 Mg Tabs (Glipizide) .... One Tablet By Mouth Twice A Day For Diabetes 16)  Diltiazem Hcl Er Beads 300 Mg Xr24h-Cap (Diltiazem Hcl Er Beads) .... Once Daily Per Dr Clarene Duke 17)  Hydrochlorothiazide 25 Mg Tabs (Hydrochlorothiazide) .... 1/2 Tab By Mouth Daily For Blood Pressure 18)  Furosemide 40 Mg Tabs (Furosemide) .... Take One Three Times Per Week As Needed Per Dr Clarene Duke 19)  Lotrimin Af 1 % Crea (Clotrimazole) .... Apply To Affected Areas Two Times A Day. Dispense 30 Grams.  Allergies (verified): 1)  Ace Inhibitors  Past History:  Past medical, surgical, family and social histories (including risk factors)  reviewed, and no changes noted (except as noted below).  Past Medical History: Reviewed history from 09/28/2009 and no changes required. UPDATED 10/30/2008 after review of old paper chart dating back to late 1990s  H. pylori negative 07/2001, Menopause at 60 y/o, on CPAP at night, sinus bradycardia with arrythmia noted on 01/21/01  #DISEASE, VOCAL CORD NEC (ICD-478.5)............DR Delford Field >mentioned in chart > Was followed by Dr. Annalee Genta ENT in late 1990s - lary showed findings c/w GERD > Was also followed by Speech Clinic at [facility]. VCD suspected there. s/p speech therap in late 1990s  ACEi Intolerant OBESITY, NOS (ICD-278.00) LEG PAIN OR KNEE PAIN (ICD-729.5) HYPERTENSION, BENIGN SYSTEMIC (ICD-401.1) HYPERCHOLESTEROLEMIA (ICD-272.0) GASTROESOPHAGEAL REFLUX, NO ESOPHAGITIS (ICD-530.81) DIABETES MELLITUS II, UNCOMPLICATED (ICD-250.00) DEPRESSION, MAJOR, RECURRENT (ICD-296.30) ATRIAL FIBRILLATION (ICD-427.31)................[facility], Dr Clarene Duke >PAF. s/p ablation >s/p pulmonary vein isolation 11/08/08 >Followed by [facility] ASTHMA, UNSPECIFIED (ICD-493.90)....................DR Delford Field >charts mention "moderare persistent" severity.  >Last PFT was late 1990s per patient hx but in review  of paper chart Dr Marchelle Gearing could not find it july 2010 >Frequent exacerbagtions needing admits late 1990s >Positive skin testing for allergies - 1998 by Dr Sidney Ace >Normal spirometry on advair/singulair - july 2010 while symptomatic  #APNEA, SLEEP (ICD-780.57)....Dr. Vassie Loll  Past Surgical History: Reviewed history from 07/11/2009 and no changes required. Colonoscopy 06/2009: Pandiverticulosis. Dr Loreta Ave s/p pulmonary vein isolation 11/08/08 at [facility] ABI = 1.99 - 03/31/2001,  Cardiac cath - 08/11/2005, Cardiac Cath- N Ef, mild CAD - 04/13/2002,  Cardiolite - scar at apical/lat wall, pos ischemia ant/anteroseptal wall - 07/20/2005,  Cholecystectomy -,  Flex sigmoid - nl - 06/02/2005,    Nissen Fundoplication 1/98 -,  R Breast Bx - sclerosing adenosis 5/93 -,  R Knee Arthroscopy - 10/12/1998, Sleep study -[facility] - 04/13/2004  Family History: Reviewed history from 06/10/2006 and no changes required. Father is deceased at 64 yo with DM and HTN., Mom died during a hysterectomy d/t a bleeding complication.  Social History: Reviewed history from 06/10/2006 and no changes required. Husband died Nov 26, 2022, lives w/ Daughter-Diana Cifelli. Nonsmoker, doesn't drink.  Involved in non-denominational [employer]. Not working  Review of Systems       The patient complains of shortness of breath with activity.  The patient denies shortness of breath at rest, productive cough, non-productive cough, coughing up blood, chest pain, irregular heartbeats, acid heartburn, indigestion, loss of appetite, weight change, abdominal pain, difficulty swallowing, sore throat, tooth/dental problems, headaches, nasal congestion/difficulty breathing through nose, sneezing, itching, ear ache, anxiety, depression, hand/feet swelling, joint stiffness or pain, rash, change in color of mucus, and fever.    Vital Signs:  Patient profile:   60 year old female Height:      66 inches Weight:      215.50 pounds BMI:     34.91 O2 Sat:      98 %  on Room air Temp:     97.7 degrees F oral Pulse rate:   73 / minute BP sitting:   130 / 88  (left arm) Cuff size:   regular  Vitals Entered By: Gweneth Dimitri RN (December 09, 2009 8:51 AM)  O2 Flow:  Room air CC: 2 wk follow up.  Pt states breathing has improved and does see improvement with Qvar.  Denies SOB, wheezing, chest tightness, cough.  No complaints.  Comments Medications reviewed with patient Daytime contact number verified with patient. Gweneth Dimitri RN  December 09, 2009 8:51 AM    Physical Exam  Additional Exam:  Gen: Pleasant, well-nourished, in no distress , normal affect ENT: clear  Neck: No JVD, no TMG, no carotid bruits Lungs: No use of accessory  muscles, no dullness to percussion, upper airway psuedowheezing  now imporved Cardiovascular: RRR, heart sounds normal, no murmurs or gallops, no peripheral edema Musculoskeletal: No deformities, no cyanosis or clubbing      Pre-Spirometry FEV1    Value: 2.16 L     Impression & Recommendations:  Problem # 1:  DISEASE, VOCAL CORD NEC (ICD-478.5) Assessment Unchanged severe vocal cord dysfunction with upper airway instability plan cont vocal hygiene finish current qvar cannister then start advair follow reflux diet  Problem # 2:  ASTHMA, UNSPECIFIED (ICD-493.90) Assessment: Unchanged see assessment one  Complete Medication List: 1)  Advair Diskus 250-50 Mcg/dose Aepb (Fluticasone-salmeterol) .... Inhale 1 puff two times a day 2)  Loratadine 10 Mg Tabs (Loratadine) .... Take 1 tablet by mouth once a day 3)  Cozaar 50 Mg Tabs (Losartan potassium) .Marland Kitchen.. 1 tablet  by mouth daily daily 4)  Coumadin 5 Mg Tabs (Warfarin sodium) .... Take as directed 5)  Flonase 50 Mcg/act Susp (Fluticasone propionate) .... Spray 1 spray into both nostrils twice a day 6)  Nexium 40 Mg Cpdr (Esomeprazole magnesium) .... One by mouth daily 7)  Singulair 10 Mg Tabs (Montelukast sodium) .... Take 1 tablet by mouth once a day per dr Delford Field 8)  Xopenex Hfa 45 Mcg/act Aero (Levalbuterol tartrate) .... Inhale 2 puff using inhaler every four hours as needed 9)  Tikosyn Caps (dofetilide)  .... One by mouth two times a day per dr fitzgerald 10)  Cpap 11cmh20 Qhs  11)  Xopenex 0.63 Mg/8ml Nebu (Levalbuterol hcl) .... One in nebulizer four times daily as needed per dr Delford Field 12)  Pravastatin Sodium 20 Mg Tabs (Pravastatin sodium) .Marland Kitchen.. 1 once daily 13)  Diabetic Shoes  .... Wear regularly 14)  Glucophage 1000 Mg Tabs (Metformin hcl) .... One-half  tablet by mouth in am and full tablet by mouth in pm 15)  Glucotrol 5 Mg Tabs (Glipizide) .... One tablet by mouth twice a day for diabetes 16)  Diltiazem Hcl Er  Beads 300 Mg Xr24h-cap (Diltiazem hcl er beads) .... Once daily per dr little 17)  Hydrochlorothiazide 25 Mg Tabs (Hydrochlorothiazide) .... 1/2 tab by mouth daily for blood pressure 18)  Furosemide 40 Mg Tabs (Furosemide) .... Take one three times per week as needed per dr little 19)  Lotrimin Af 1 % Crea (Clotrimazole) .... Apply to affected areas two times a day. dispense 30 grams.  Patient Instructions: 1)  Resume nexium 2)  Finish Qvar then resume Advair as before 3)  Follow reflux diet 4)  Return 6 weeks

## 2010-05-13 NOTE — Consult Note (Signed)
Summary: [facility]: sched c-scope 07/05/09  [facility]   Imported By: Clydell Hakim 05/03/2009 14:37:22  _____________________________________________________________________  External Attachment:    Type:   Image     Comment:   External Document

## 2010-05-13 NOTE — Miscellaneous (Signed)
Summary: Orders Update   Clinical Lists Changes  Problems: Added new problem of BRONCHIAL PNEUMONIA (ICD-485) Orders: Added new Test order of T-2 View CXR (71020TC) - Signed

## 2010-05-13 NOTE — Assessment & Plan Note (Signed)
Summary: 3 mo f/u,df   Vital Signs:  Patient profile:   60 year old female Height:      66 inches Weight:      208.7 pounds BMI:     33.81 Pulse rate:   91 / minute BP sitting:   110 / 77  (right arm)  Vitals Entered By: Arlyss Repress CMA, (February 25, 2010 8:42 AM) CC: f/u DM Is Patient Diabetic? Yes Pain Assessment Patient in pain? no        Primary Care Provider:  Makalah Asberry MD  CC:  f/u DM.  History of Present Illness: 60 y/o F with DM is here for f/u   DIABETES Meds: glucotrol 5mg  two times a day, glucophage 500mg  two times a day  Compliance : yes  Diet: tyring to eat healthy diet, low carb, no fat  Lightheadedness:no    Dizziness:no    Confusion:no    Shakiness:no    Abd  pain:no   Nausea:no    Vomiting:no      OBESITY BMI:  33.8  vs 34.5 at last visit  Weight difference since last OV: -5 lbs  Exercise: trying to exercise, but is not consistenct.  she has chronic leg pain that prevents her from a lot of physical activity        Diet:  she is trying to follow a low fat, low carb diet.     Bronchial PNA: Saw Dr Delford Field for this.  Dr Delford Field prescribed an antibiotic (pt cannot recall the name).  She saw Dr Delford Field 2 wks ago and has been much improved.    Atrial Fibrillation: On chronic coumadin.  Sees Dr Clarene Duke. Last INR 2.5, checked at Dr Fredirick Maudlin office  Rate control with: none Rhythm control with: Diltiazem 300mg  daily  No palpitations, no dyspnea, no syncope, no chest pain, no lightheadedness      Habits & Providers  Alcohol-Tobacco-Diet     Tobacco Status: never  Current Medications (verified): 1)  Advair Diskus 250-50 Mcg/dose Aepb (Fluticasone-Salmeterol) .... Inhale 1 Puff Two Times A Day 2)  Loratadine 10 Mg Tabs (Loratadine) .... Take 1 Tablet By Mouth Once A Day 3)  Cozaar 50 Mg Tabs (Losartan Potassium) .Marland Kitchen.. 1 Tablet  By Mouth Daily Daily 4)  Coumadin 5 Mg Tabs (Warfarin Sodium) .... Take As Directed 5)  Flonase 50 Mcg/act Susp (Fluticasone  Propionate) .... Spray 1 Spray Into Both Nostrils Twice A Day 6)  Nexium 40 Mg Cpdr (Esomeprazole Magnesium) .... One By Mouth Daily 7)  Singulair 10 Mg Tabs (Montelukast Sodium) .... Take 1 Tablet By Mouth Once A Day Per Dr Delford Field 8)  Tikosyn 250 Mcg Caps (Dofetilide) .Marland Kitchen.. 1 By Mouth Two Times A Day 9)  Cpap 11cmh20 Qhs 10)  Pravastatin Sodium 20 Mg Tabs (Pravastatin Sodium) .Marland Kitchen.. 1 Once Daily 11)  Diabetic Shoes .... Wear Regularly 12)  Glucophage 500 Mg Tabs (Metformin Hcl) .Marland Kitchen.. 1 Tab By Mouth Two Times A Day For Diabetes 13)  Glucotrol 5 Mg Tabs (Glipizide) .... One Tablet By Mouth Twice A Day For Diabetes 14)  Diltiazem Hcl Er Beads 300 Mg Xr24h-Cap (Diltiazem Hcl Er Beads) .... Once Daily Per Dr Clarene Duke 15)  Ferrous Sulfate 325 (65 Fe) Mg Tabs (Ferrous Sulfate) .Marland Kitchen.. 1 Tab By Mouth Two Times A Day For Iron Deficiency 16)  Tessalon 200 Mg Caps (Benzonatate) .Marland Kitchen.. 1 By Mouth Three Times A Day As Needed Cough 17)  Xopenex Hfa 45 Mcg/act Aero (Levalbuterol Tartrate) .... Inhale 2 Puff Using  Inhaler Every Four Hours As Needed 18)  Furosemide 40 Mg Tabs (Furosemide) .... Take One Three Times Per Week As Needed Per Dr Clarene Duke 19)  Xopenex 0.63 Mg/49ml  Nebu (Levalbuterol Hcl) .... One in Nebulizer Four Times Daily As Needed Per Dr Delford Field  Allergies (verified): 1)  Ace Inhibitors  Past History:  Past Medical History: Last updated: 09/28/2009 UPDATED 10/30/2008 after review of old paper chart dating back to late 1990s  H. pylori negative 07/2001, Menopause at 60 y/o, on CPAP at night, sinus bradycardia with arrythmia noted on 01/21/01  #DISEASE, VOCAL CORD NEC (ICD-478.5)............DR Delford Field >mentioned in chart > Was followed by Dr. Annalee Genta ENT in late 1990s - lary showed findings c/w GERD > Was also followed by Speech Clinic at [facility] VCD suspected there. s/p speech therap in late 1990s  ACEi Intolerant OBESITY, NOS (ICD-278.00) LEG PAIN OR KNEE PAIN (ICD-729.5) HYPERTENSION, BENIGN  SYSTEMIC (ICD-401.1) HYPERCHOLESTEROLEMIA (ICD-272.0) GASTROESOPHAGEAL REFLUX, NO ESOPHAGITIS (ICD-530.81) DIABETES MELLITUS II, UNCOMPLICATED (ICD-250.00) DEPRESSION, MAJOR, RECURRENT (ICD-296.30) ATRIAL FIBRILLATION (ICD-427.31)................[facility], Dr Clarene Duke >PAF. s/p ablation >s/p pulmonary vein isolation 11/08/08 >Followed by [facility] ASTHMA, UNSPECIFIED (ICD-493.90)....................DR Delford Field >charts mention "moderare persistent" severity.  >Last PFT was late 1990s per patient hx but in review  of paper chart Dr Marchelle Gearing could not find it july 2010 >Frequent exacerbagtions needing admits late 1990s >Positive skin testing for allergies - 1998 by Dr Sidney Ace >Normal spirometry on advair/singulair - july 2010 while symptomatic  #APNEA, SLEEP (ICD-780.57)....Dr. Vassie Loll  Past Surgical History: Last updated: 07/11/2009 Colonoscopy 06/2009: Pandiverticulosis. Dr Loreta Ave s/p pulmonary vein isolation 11/08/08 at [facility] ABI = 1.99 - 03/31/2001,  Cardiac cath - 08/11/2005, Cardiac Cath- N Ef, mild CAD - 04/13/2002,  Cardiolite - scar at apical/lat wall, pos ischemia ant/anteroseptal wall - 07/20/2005,  Cholecystectomy -,  Flex sigmoid - nl - 06/02/2005,  Nissen Fundoplication 1/98 -,  R Breast Bx - sclerosing adenosis 5/93 -,  R Knee Arthroscopy - 10/12/1998, Sleep study -OS[facility] - 04/13/2004  Family History: Last updated: Jun 19, 2006 Father is deceased at 84 yo with DM and HTN., Mom died during a hysterectomy d/t a bleeding complication.  Social History: Last updated: 06-19-2006 Husband died 11/24/2022, lives w/ Daughter-Diana Isakson. Nonsmoker, doesn't drink.  Involved in non-denominational [employer]. Not working  Risk Factors: Alcohol Use: 0 (02/05/2010) Diet: trying to eat healthy (01/14/2010) Exercise: yes (09/23/2009)  Risk Factors: Smoking Status: never (02/25/2010) Passive Smoke Exposure: no (02/05/2010)  Review of Systems       per hpi   Physical  Exam  General:  Well-developed,well-nourished,in no acute distress; alert,appropriate and cooperative throughout examination. vitals reviewed  Neck:  supple and full ROM.   Lungs:  Normal respiratory effort, chest expands symmetrically. Lungs are clear to auscultation, no crackles or wheezes. Heart:  Normal rate and regular rhythm. S1 and S2 normal without gallop, murmur, click, rub or other extra sounds.  No bruit  Abdomen:  Bowel sounds positive,abdomen soft and non-tender without masses, organomegaly or hernias noted. obese  Pulses:  equal bilaterally  Extremities:  No clubbing, cyanosis, edema, or deformity noted with normal full range of motion of all joints.   Neurologic:  alert & oriented X3 and cranial nerves II-XII intact.     Impression & Recommendations:  Problem # 1:  DIABETES MELLITUS II, UNCOMPLICATED (ICD-250.00) Assessment Unchanged A1C  6.6% in 12/2009 and at goal.  Will continue one current meds.  She needs to have eye exam.    Herr updated medication list for this  problem includes:    Cozaar 50 Mg Tabs (Losartan potassium) .Marland Kitchen... 1 tablet  by mouth daily daily    Glucophage 500 Mg Tabs (Metformin hcl) .Marland Kitchen... 1 tab by mouth two times a day for diabetes    Glucotrol 5 Mg Tabs (Glipizide) ..... One tablet by mouth twice a day for diabetes  Orders: [facility]- Est  Level 4 (16109)  Problem # 2:  ATRIAL FIBRILLATION (ICD-427.31) Assessment: Unchanged INR at goal when checked Dr Fredirick Maudlin office 2 wks ago.  Will follow along with Dr Clarene Duke.    Her updated medication list for this problem includes:    Coumadin 5 Mg Tabs (Warfarin sodium) .Marland Kitchen... Take as directed    Tikosyn 250 Mcg Caps (Dofetilide) .Marland Kitchen... 1 by mouth two times a day    Diltiazem Hcl Er Beads 300 Mg Xr24h-cap (Diltiazem hcl er beads) ..... Once daily per dr little  Orders: [facility]- Est  Level 4 (60454)  Problem # 3:  OBESITY, NOS (ICD-278.00) Assessment: Improved Pt lost 5 lbs since last visit.  Goal weight to get her  to Overweight vs Obesity is 190lbs.  I've encouraged pt to exercise routinely and continue healthy diet.   Orders: [facility]- Est  Level 4 (09811)  Problem # 4:  HYPERTENSION, BENIGN SYSTEMIC (ICD-401.1) Assessment: Comment Only HCTZ was d/c, as it is contraindicated with Tikoysin.  Pt has diuretic effects with Lasix and BP is at goal.  She has renal protection with Cozaar (could not tolerate ACE d/t cough).   Her updated medication list for this problem includes:    Cozaar 50 Mg Tabs (Losartan potassium) .Marland Kitchen... 1 tablet  by mouth daily daily    Diltiazem Hcl Er Beads 300 Mg Xr24h-cap (Diltiazem hcl er beads) ..... Once daily per dr little    Furosemide 40 Mg Tabs (Furosemide) .Marland Kitchen... Take one three times per week as needed per dr little  Complete Medication List: 1)  Advair Diskus 250-50 Mcg/dose Aepb (Fluticasone-salmeterol) .... Inhale 1 puff two times a day 2)  Loratadine 10 Mg Tabs (Loratadine) .... Take 1 tablet by mouth once a day 3)  Cozaar 50 Mg Tabs (Losartan potassium) .Marland Kitchen.. 1 tablet  by mouth daily daily 4)  Coumadin 5 Mg Tabs (Warfarin sodium) .... Take as directed 5)  Flonase 50 Mcg/act Susp (Fluticasone propionate) .... Spray 1 spray into both nostrils twice a day 6)  Nexium 40 Mg Cpdr (Esomeprazole magnesium) .... One by mouth daily 7)  Singulair 10 Mg Tabs (Montelukast sodium) .... Take 1 tablet by mouth once a day per dr Delford Field 8)  Tikosyn 250 Mcg Caps (Dofetilide) .Marland Kitchen.. 1 by mouth two times a day 9)  Cpap 11cmh20 Qhs  10)  Pravastatin Sodium 20 Mg Tabs (Pravastatin sodium) .Marland Kitchen.. 1 once daily 11)  Diabetic Shoes  .... Wear regularly 12)  Glucophage 500 Mg Tabs (Metformin hcl) .Marland Kitchen.. 1 tab by mouth two times a day for diabetes 13)  Glucotrol 5 Mg Tabs (Glipizide) .... One tablet by mouth twice a day for diabetes 14)  Diltiazem Hcl Er Beads 300 Mg Xr24h-cap (Diltiazem hcl er beads) .... Once daily per dr little 15)  Ferrous Sulfate 325 (65 Fe) Mg Tabs (Ferrous sulfate) .Marland Kitchen.. 1 tab by  mouth two times a day for iron deficiency 16)  Tessalon 200 Mg Caps (Benzonatate) .Marland Kitchen.. 1 by mouth three times a day as needed cough 17)  Xopenex Hfa 45 Mcg/act Aero (Levalbuterol tartrate) .... Inhale 2 puff using inhaler every four hours as needed 18)  Furosemide 40 Mg Tabs (Furosemide) .... Take one three times per week as needed per dr little 19)  Xopenex 0.63 Mg/68ml Nebu (Levalbuterol hcl) .... One in nebulizer four times daily as needed per dr Delford Field  Patient Instructions: 1)  Please schedule a follow-up appointment in 3 months for diabetes. 2)  Congrats!  You are doing great with your weight loss.  Keep it up! Prescriptions: GLUCOTROL 5 MG TABS (GLIPIZIDE) one tablet by mouth twice a day for diabetes  #60 x 6   Entered and Authorized by:   Angeline Slim MD   Signed by:   Angeline Slim MD on 02/25/2010   Method used:   Print then Give to Patient   RxID:   [id number] GLUCOPHAGE 500 MG TABS (METFORMIN HCL) 1 tab by mouth two times a day for diabetes  #60 x 6   Entered and Authorized by:   Angeline Slim MD   Signed by:   Angeline Slim MD on 02/25/2010   Method used:   Print then Give to Patient   RxID:   [id number]    Orders Added: 1)  [facility]- Est  Level 4 [27253]     Prevention & Chronic Care Immunizations   Influenza vaccine: Fluvax Non-MCR  (01/14/2010)   Influenza vaccine due: 01/08/2009    Tetanus booster: 01/11/2001: Done.   Tetanus booster due: 01/12/2011    Pneumococcal vaccine: Done.  (01/11/2001)   Pneumococcal vaccine due: None  Colorectal Screening   Hemoccult: Done.  (03/18/2005)   Hemoccult due: Not Indicated    Colonoscopy: Results:  PanDiverticulosis.  Small internal hemorroids  LOCATION: [facility].  Dr Loreta Ave        (07/05/2009)   Colonoscopy action/deferral: Repeat colonoscopy in 10 years.    (07/05/2009)   Colonoscopy due: 07/2019  Other Screening   Pap smear: normal  (05/13/2007)   Pap smear action/deferral: Deferred-3 yr interval  (04/08/2009)   Pap  smear due: 05/12/2010    Mammogram: ASSESSMENT: Negative - BI-RADS 1^MM DIGITAL SCREENING  (03/27/2009)   Mammogram due: 03/27/2010   Smoking status: never  (02/25/2010)  Diabetes Mellitus   HgbA1C: 6.6  (12/31/2009)   Hemoglobin A1C due: 03/08/2008    Eye exam: normal  (08/13/2008)   Eye exam due: 08/2009    Foot exam: yes  (09/23/2009)   High risk foot: Not documented   Foot care education: Not documented   Foot exam due: 06/27/2008    Urine microalbumin/creatinine ratio: Not documented   Urine microalbumin/cr due: 12/06/2008    Diabetes flowsheet reviewed?: Yes   Progress toward A1C goal: At goal  Lipids   Total Cholesterol: 162  (12/31/2009)   LDL: 93  (12/31/2009)   LDL Direct: 94  (12/07/2007)   HDL: 58  (12/31/2009)   Triglycerides: 53  (12/31/2009)    SGOT (AST): 16  (12/31/2009)   SGPT (ALT): 22  (12/31/2009)   Alkaline phosphatase: 69  (12/31/2009)   Total bilirubin: 0.4  (12/31/2009)    Lipid flowsheet reviewed?: Yes   Progress toward LDL goal: At goal  Hypertension   Last Blood Pressure: 110 / 77  (02/25/2010)   Serum creatinine: 0.86  (12/31/2009)   Serum potassium 3.9  (12/31/2009)    Hypertension flowsheet reviewed?: Yes   Progress toward BP goal: At goal  Self-Management Support :   Personal Goals (by the next clinic visit) :     Personal A1C goal: 8  (06/19/2009)     Personal blood pressure goal: 140/90  (05/07/2009)  Personal LDL goal: 100  (04/08/2009)    Diabetes self-management support: Written self-care plan  (09/23/2009)    Hypertension self-management support: Written self-care plan  (09/23/2009)    Lipid self-management support: Written self-care plan  (09/23/2009)

## 2010-05-13 NOTE — Assessment & Plan Note (Signed)
 Summary: Hospital Admission H&P   Vital Signs:  Patient Profile:   60 Years Old Female Height:     65 inches O2 Sat:      99 % Pulse rate:   66 / minute Resp:     20 per minute BP supine:   134 / 78                 PCP:  LUDIE LITTLER MD  Chief Complaint:  Post-menopausal bleeding.  History of Present Illness: 60 year old post menopausal female with hx asthma, afib (s/p ablation) and on coumadin  therapy, DM2, HLD, GERD, Sleep apnea presented to [facility] ER with vaginal bleeding this morning.  Pt had not had any bleeding since menopause at age 53.  Bleeding was constant, soaked 3 pads prior to presenting in ED.  Has not improved.  Associated with mild abdominal pain located in lower abdomen and also radiating between shoulder blades on back.   Denies any changes in vision, N/V/D/C, SOB, lightheadedness, or CP.  No trauma,  no changes in weight or appetite.  Denies any history of vaginal discharge, burning during urination, or blood in feces.  Last sexual intercourse was 5  years prior.  INR at [facility] ED was 3.7.    Updated Prior Medication List: ADVAIR DISKUS 250-50 MCG/DOSE MISC (FLUTICASONE -SALMETEROL) Inhale 1 puff as directed twice a day ALLEGRA 180 MG TABS (FEXOFENADINE HCL) Take 1 tablet by mouth once a day ATACAND 16 MG TABS (CANDESARTAN CILEXETIL) Take 1 tablet by mouth once a day COUMADIN  5 MG TABS (WARFARIN SODIUM ) Take CARTIA XT 180 MG  CP24 (DILTIAZEM HCL COATED BEADS) one by mouth once daily FLONASE  50 MCG/ACT SUSP (FLUTICASONE  PROPIONATE) Spray 1 spray into both nostrils twice a day GLUCOTROL  XL 10 MG TB24 (GLIPIZIDE ) Take 1 tablet by mouth once a day NEXIUM  40 MG CPDR (ESOMEPRAZOLE  MAGNESIUM ) one by mouth two times a day SINGULAIR  10 MG TABS (MONTELUKAST  SODIUM) Take 1 tablet by mouth once a day XOPENEX  HFA 45 MCG/ACT AERO (LEVALBUTEROL  TARTRATE) Inhale 2 puff using inhaler every four hours [BMN] ZOCOR 40 MG TABS (SIMVASTATIN) Take 1 tablet by mouth once a day BROVANA 15  MCG/2ML  NEBU (ARFORMOTEROL TARTRATE) one in neb two times a day TIKOSYN  500 MCG  CAPS (DOFETILIDE ) one by mouth two times a day * CPAP 11CMH20 QHS  XOPENEX  0.63 MG/3ML  NEBU (LEVALBUTEROL  HCL) one in nebulizer four times daily as needed [BMN]  Current Allergies: No known allergies   Past Medical History:    Reviewed history from 05/09/2007 and no changes required:       H. pylori negative 07/2001, Menopause at 60 y/o, on CPAP at night, sinus bradycardia with arrythmia noted on 01/21/01              Current Problems:        DISEASE, VOCAL CORD NEC (ICD-478.5)       OBESITY, NOS (ICD-278.00)       LEG PAIN OR KNEE PAIN (ICD-729.5)       HYPERTENSION, BENIGN SYSTEMIC (ICD-401.1)       HYPERCHOLESTEROLEMIA (ICD-272.0)       GASTROESOPHAGEAL REFLUX, NO ESOPHAGITIS (ICD-530.81)       DIABETES MELLITUS II, UNCOMPLICATED (ICD-250.00)       DEPRESSION, MAJOR, RECURRENT (ICD-296.30)       ATRIAL FIBRILLATION (ICD-427.31)       ASTHMA, UNSPECIFIED (ICD-493.90)       APNEA, SLEEP (ICD-780.57)  Past Surgical History:    Reviewed history from 06/10/2006 and no changes required:       ABI = 1.99 - 03/31/2001, Cardiac cath - 08/11/2005, Cardiac Cath- N Ef, mild CAD - 04/13/2002, Cardiolite - scar at apical/lat wall, pos ischemia ant/anteroseptal wall - 07/20/2005, Cholecystectomy -, Flex sigmoid - nl - 06/02/2005, Nissen Fundoplication 1/98 -, R Breast Bx - sclerosing adenosis 5/93 -, R Knee Arthroscopy - 10/12/1998, Sleep study -OSA - 04/13/2004   Family History:    Reviewed history from 06/10/2006 and no changes required:       Father is deceased at 75 yo with DM and HTN., Mom died during a hysterectomy d/t a bleeding complication.  Social History:    Reviewed history from 06/10/2006 and no changes required:       Husband died November 24, 2023, lives w/ Daughter-Diana Hansman. Nonsmoker, doesn't drink.  Involved in non-denominational [employer]. Not working     Physical Exam  General:      Well-developed,well-nourished,in no acute distress; alert,appropriate and cooperative throughout examination Head:     Normocephalic and atraumatic without obvious abnormalities Eyes:     No corneal or conjunctival inflammation noted. EOMI. Perrla. Vision grossly normal, wears glasses.  No scleral icterus, ?conjunctival pallor Ears:     External ear exam shows no significant lesions or deformities. Nose:     External nasal examination shows no deformity or inflammation. Mouth:     Moist mucous membranes Neck:     No deformities, masses, or tenderness noted. Chest Wall:     No deformities, masses, or tenderness noted. Lungs:     Normal respiratory effort, chest expands symmetrically. Lungs are clear to auscultation, no crackles or wheezes. Heart:     Normal rate and regular rhythm. S1 and S2 normal without gallop, murmur, click, rub or other extra sounds. Abdomen:     Bowel sounds positive,abdomen soft and non-tender without masses, organomegaly or hernias noted.  Genitalia:     Per Dr. Levander [facility] ED) Pt with normal vaginal mucosa without lesions, small trickle of blood noted coming from cervical os.  Cervix without lesions or tenderness or discharge. Msk:     no joint swelling and no redness over joints.   Extremities:     No clubbing, cyanosis, edema, or deformity noted. Neurologic:     alert & oriented X3, cranial nerves II-XII intact, strength normal in all extremities, sensation intact to light touch, and DTRs symmetrical and normal.   Skin:     no rashes, no suspicious lesions, no ecchymoses, and no petechiae.   Cervical Nodes:     No lymphadenopathy noted Psych:     Cognition and judgment appear intact. Alert and cooperative with normal attention span and concentration. No apparent delusions, illusions, hallucinations   Labs:  Hb: 11.8 plt: 205  INR: 3.7  Impression & Recommendations:  Problem # 1:  POSTMENOPAUSAL BLEEDING (ICD-627.1) I suspect that the bleed is a  complication of her supratherapeutic INR so I will start by reversing her anticoagulation by holding her coumadin  and giving 1 Unit FFP.  Already received oral Vitamin K in [facility] ED.  No need for rapid reversal as bleeding is minimal and Hb Stable.  Will monitor q8h CBCs and place pt on telemetry.  I must however rule out other sources of  postmenopausal bleeding.  DIfferentials include uterine fibroids, polyps, and endometrial cancer.  Will do pelvic ultrasound with infusion of saline into uterus to evaluate for masses, uterine stripe, or polyps.  WIll likely need outpatient endometrial biopsy.  Problem # 2:  ATRIAL FIBRILLATION (ICD-427.31) Holding coumadin  Continue Tikosyn  Obtain ECG Place on telemetry   Her updated medication list for this problem includes:    Coumadin  5 Mg Tabs (Warfarin sodium ) .SABRA... Take    Tikosyn  500 Mcg Caps (Dofetilide ) ..... One by mouth two times a day   Problem # 3:  DIABETES MELLITUS II, UNCOMPLICATED (ICD-250.00) Home meds  Her updated medication list for this problem includes:    Atacand 16 Mg Tabs (Candesartan cilexetil) .SABRA... Take 1 tablet by mouth once a day    Glucotrol  Xl 10 Mg Tb24 (Glipizide ) .SABRA... Take 1 tablet by mouth once a day   Problem # 4:  ASTHMA, UNSPECIFIED (ICD-493.90) Home meds  Her updated medication list for this problem includes:    Advair Diskus 250-50 Mcg/dose Misc (Fluticasone -salmeterol) ..... Inhale 1 puff as directed twice a day    Singulair  10 Mg Tabs (Montelukast  sodium) .SABRA... Take 1 tablet by mouth once a day    Brovana 15 Mcg/2ml Nebu (Arformoterol tartrate) ..... One in neb two times a day    Xopenex  0.63 Mg/3ml Nebu (Levalbuterol  hcl) ..... One in nebulizer four times daily as needed  Her updated medication list for this problem includes:    Advair Diskus 250-50 Mcg/dose Misc (Fluticasone -salmeterol) ..... Inhale 1 puff as directed twice a day    Singulair  10 Mg Tabs (Montelukast  sodium) .SABRA... Take 1 tablet by mouth  once a day    Xopenex  Hfa 45 Mcg/act Aero (Levalbuterol  tartrate) ..... Inhale 2 puff using inhaler every four hours    Brovana 15 Mcg/2ml Nebu (Arformoterol tartrate) ..... One in neb two times a day    Xopenex  0.63 Mg/3ml Nebu (Levalbuterol  hcl) ..... One in nebulizer four times daily as needed   Problem # 5:  APNEA, SLEEP (ICD-780.57) WIll do CPAP at hoem settings  Problem # 6:  GASTROESOPHAGEAL REFLUX, NO ESOPHAGITIS (ICD-530.81) Home meds   Her updated medication list for this problem includes:    Nexium  40 Mg Cpdr (Esomeprazole  magnesium ) ..... One by mouth two times a day   Problem # 7:  HYPERCHOLESTEROLEMIA (ICD-272.0) Home meds   Her updated medication list for this problem includes:    Zocor 40 Mg Tabs (Simvastatin) .SABRA... Take 1 tablet by mouth once a day   Problem # 8:  HYPERTENSION, BENIGN SYSTEMIC (ICD-401.1) Home meds  Her updated medication list for this problem includes:    Atacand 16 Mg Tabs (Candesartan cilexetil) .SABRA... Take 1 tablet by mouth once a day    Cartia Xt 180 Mg Cp24 (Diltiazem hcl coated beads) ..... One by mouth once daily   FEN/GI diabetic diet IVF: KVO Dispo: pending stabilization of bleeding and pelvic U/S results.   ]  Appended Document: Hospital Admission H&P I saw and examined the patient with Dr. Lona and agree with his excellent note as above.   In brief, this is a 60 yo AAF with post-menopausal bleeding and supratherapeutic INR.  The bleeding started this morning.  She soaked 3 pads prior to arrival in the ED  ~ 12 noon.  She denies any other bleeding/bruising symptoms.  Menopausal at age 26.  No vaginal bleeding since.  Not sexually active x 5 yrs.  No vaginal discharge.    Please refer to R1 H&P for PMHx, Meds, Allergies, FHx, SHx.  PEx:  AF. VSS.   Gen:  Well-developed, well-nourished, no acute distress.  Alert and oriented x 3.  HEENT:  Normocephalic, atraumatic, no abnormalities noted.  Pupils equal round and reactive  to light, conjunctivae and lids clear.  No scleral icterus. Moist mucous membranes.  No erythema or exudate.  CV:  Regular rate and rhythm.  No murmur, rub, or gallop.  2+ Dorsalis Pedis pulses.  LUNG:  Clear to auscultation bilaterally.  Normal work of breathing.  No wheezes, rales, or rhonchi. ABD:  Normoactive bowel sounds.  Soft, non-tender, non-distended.  EXT:  No clubbing, cyanosis, or edema.  SKIN:  No rash or lesions.  No petechiae or ecchymoses. NEURO:  A/O x 3.  CN II-XII grossly intact.  Strength and sensation normal throughout.  DTRs symmetric and normal.  Labs:   Hgb: 11.8  Plt: 205 INR: 3.7  A/P:  60 yo AAF with Postmenopausal Bleeding and Supratherapeutic INR.  1.  Postmenopausal Bleeding - Hgb stable and patient asymptomatic from bleeding.  Supratherapeutic INR likely playing a role; however, must look for source.  Will obtain TV/Pelvic US  with saline infusion to measure endometrial stripe and look for fibroids / polyps.  Got oral vitamin K in ED.  Getting 1 unit FFP on arrival.  Will place on telemetry given hx of Afib.  2.  Paroxysmal A-fib - Currently NSR.  Hold Coumadin  given supratherapeutic.  Continue Tikosyn .  Obtaine EKG.  Monitor on Telemetry.    3.  Diabetes - continue home meds  4.  Asthma - continue home meds  5.  OSA - continue CPAP at home settings  6.  GERD - continue home dose PPI  7.  HLD - continue home dose statin  8.  HTN - continue home meds  9.  FEN/GI - Diabetic diet as tolerated.  Lytes stable.  KVO IVF.  10.  Dispo - pending US  and stabilization of bleeding.

## 2010-05-13 NOTE — Miscellaneous (Signed)
Summary: Orders Update  Clinical Lists Changes  Orders: Added new Service order of Est. Patient Level III (99213) - Signed 

## 2010-05-13 NOTE — Consult Note (Signed)
Summary: [facility]   Imported By: Clydell Hakim 09/27/2009 15:37:02  _____________________________________________________________________  External Attachment:    Type:   Image     Comment:   External Document  Appended Document: [facility]    Past History:  Past Medical History: UPDATED 10/30/2008 after review of old paper chart dating back to late 1990s  H. pylori negative 07/2001, Menopause at 60 y/o, on CPAP at night, sinus bradycardia with arrythmia noted on 01/21/01  #DISEASE, VOCAL CORD NEC (ICD-478.5)............DR Delford Field >mentioned in chart > Was followed by Dr. Annalee Genta ENT in late 1990s - lary showed findings c/w GERD > Was also followed by Speech Clinic at [facility]. VCD suspected there. s/p speech therap in late 1990s  ACEi Intolerant OBESITY, NOS (ICD-278.00) LEG PAIN OR KNEE PAIN (ICD-729.5) HYPERTENSION, BENIGN SYSTEMIC (ICD-401.1) HYPERCHOLESTEROLEMIA (ICD-272.0) GASTROESOPHAGEAL REFLUX, NO ESOPHAGITIS (ICD-530.81) DIABETES MELLITUS II, UNCOMPLICATED (ICD-250.00) DEPRESSION, MAJOR, RECURRENT (ICD-296.30) ATRIAL FIBRILLATION (ICD-427.31)................[facility], Dr Clarene Duke >PAF. s/p ablation >s/p pulmonary vein isolation 11/08/08 >Followed by [facility] ASTHMA, UNSPECIFIED (ICD-493.90)....................DR Delford Field >charts mention "moderare persistent" severity.  >Last PFT was late 1990s per patient hx but in review  of paper chart Dr Marchelle Gearing could not find it july 2010 >Frequent exacerbagtions needing admits late 1990s >Positive skin testing for allergies - 1998 by Dr Sidney Ace >Normal spirometry on advair/singulair - july 2010 while symptomatic  #APNEA, SLEEP (ICD-780.57)....Dr. Vassie Loll

## 2010-05-13 NOTE — Consult Note (Signed)
Summary: [facility]  [facility]   Imported By: Clydell Hakim 01/22/2010 09:12:02  _____________________________________________________________________  External Attachment:    Type:   Image     Comment:   External Document

## 2010-05-13 NOTE — Assessment & Plan Note (Signed)
Summary: f/up,tcb   Vital Signs:  Patient profile:   60 year old female Height:      66 inches Weight:      216 pounds BMI:     34.99 Temp:     97.6 degrees F oral Pulse rate:   93 / minute BP sitting:   117 / 83  (left arm) Cuff size:   large  Vitals Entered By: Jimmy Footman, CMA (January 14, 2010 9:51 AM) CC: blood work f/u Is Patient Diabetic? Yes Did you bring your meter with you today? No   Primary Care Provider:  Cat Ta MD  CC:  blood work f/u.  History of Present Illness: 60 y/o F here to f/u lab work from yesterday.  She saw Dr Earnest Bailey for dyspnea and was Dx to be in asthma exacerbation.  She was prescribed prednisone, but she has not started on it.  She states that she did not know why she needed the prednisone.  States that she is still having dyspnea and some wheezing.   DIABETES Meds: glucophage 500mg  in AM, 1000mg  in PM, glucotrol 5mg  bid ComplianceL: she changed pm glucophage to 500mg  due to abd s/e Diet: yes Lightheadedness: no    Dizziness: no    Confusion: no    Shakiness:no    Abd  pain: yes   Nausea: no    Vomiting: no   Anemia: Fe studies since Hb low at last check.           Habits & Providers  Alcohol-Tobacco-Diet     Alcohol drinks/day: 0     Tobacco Status: never     Passive Smoke Exposure: no     Diet Comments: trying to eat healthy  Current Medications (verified): 1)  Advair Diskus 250-50 Mcg/dose Aepb (Fluticasone-Salmeterol) .... Inhale 1 Puff Two Times A Day 2)  Loratadine 10 Mg Tabs (Loratadine) .... Take 1 Tablet By Mouth Once A Day 3)  Cozaar 50 Mg Tabs (Losartan Potassium) .Marland Kitchen.. 1 Tablet  By Mouth Daily Daily 4)  Coumadin 5 Mg Tabs (Warfarin Sodium) .... Take As Directed 5)  Flonase 50 Mcg/act Susp (Fluticasone Propionate) .... Spray 1 Spray Into Both Nostrils Twice A Day 6)  Nexium 40 Mg Cpdr (Esomeprazole Magnesium) .... One By Mouth Daily 7)  Singulair 10 Mg Tabs (Montelukast Sodium) .... Take 1 Tablet By Mouth Once A Day Per Dr  Delford Field 8)  Xopenex Hfa 45 Mcg/act Aero (Levalbuterol Tartrate) .... Inhale 2 Puff Using Inhaler Every Four Hours As Needed 9)  Tikosyn  Caps (Dofetilide) .... One By Mouth Two Times A Day Per Dr Sampson Goon 10)  Cpap 11cmh20 Qhs 11)  Xopenex 0.63 Mg/56ml  Nebu (Levalbuterol Hcl) .... One in Nebulizer Four Times Daily As Needed Per Dr Delford Field 12)  Pravastatin Sodium 20 Mg Tabs (Pravastatin Sodium) .Marland Kitchen.. 1 Once Daily 13)  Diabetic Shoes .... Wear Regularly 14)  Glucophage 500 Mg Tabs (Metformin Hcl) .Marland Kitchen.. 1 Tab By Mouth Two Times A Day For Diabetes 15)  Glucotrol 5 Mg Tabs (Glipizide) .... One Tablet By Mouth Twice A Day For Diabetes 16)  Diltiazem Hcl Er Beads 300 Mg Xr24h-Cap (Diltiazem Hcl Er Beads) .... Once Daily Per Dr Clarene Duke 17)  Hydrochlorothiazide 25 Mg Tabs (Hydrochlorothiazide) .... 1/2 Tab By Mouth Daily For Blood Pressure 18)  Furosemide 40 Mg Tabs (Furosemide) .... Take One Three Times Per Week As Needed Per Dr Clarene Duke 19)  Lotrimin Af 1 % Crea (Clotrimazole) .... Apply To Affected Areas Two  Times A Day. Dispense 30 Grams. 20)  Prednisone 50 Mg Tabs (Prednisone) .... One Tab Daily For 5 Days 21)  Vicodin 5-500 Mg Tabs (Hydrocodone-Acetaminophen) .... One Tablet Every 6 Hours As Needed For Pain 22)  Ferrous Sulfate 325 (65 Fe) Mg Tabs (Ferrous Sulfate) .Marland Kitchen.. 1 Tab By Mouth Two Times A Day For Iron Deficiency  Allergies (verified): 1)  Ace Inhibitors  Past History:  Past Medical History: Last updated: 09/28/2009 UPDATED 10/30/2008 after review of old paper chart dating back to late 1990s  H. pylori negative 07/2001, Menopause at 60 y/o, on CPAP at night, sinus bradycardia with arrythmia noted on 01/21/01  #DISEASE, VOCAL CORD NEC (ICD-478.5)............DR Delford Field >mentioned in chart > Was followed by Dr. Annalee Genta ENT in late 1990s - lary showed findings c/w GERD > Was also followed by Speech Clinic at [facility]. VCD suspected there. s/p speech therap in late 1990s  ACEi  Intolerant OBESITY, NOS (ICD-278.00) LEG PAIN OR KNEE PAIN (ICD-729.5) HYPERTENSION, BENIGN SYSTEMIC (ICD-401.1) HYPERCHOLESTEROLEMIA (ICD-272.0) GASTROESOPHAGEAL REFLUX, NO ESOPHAGITIS (ICD-530.81) DIABETES MELLITUS II, UNCOMPLICATED (ICD-250.00) DEPRESSION, MAJOR, RECURRENT (ICD-296.30) ATRIAL FIBRILLATION (ICD-427.31)................[facility], Dr Clarene Duke >PAF. s/p ablation >s/p pulmonary vein isolation 11/08/08 >Followed by [facility] ASTHMA, UNSPECIFIED (ICD-493.90)....................DR Delford Field >charts mention "moderare persistent" severity.  >Last PFT was late 1990s per patient hx but in review  of paper chart Dr Marchelle Gearing could not find it july 2010 >Frequent exacerbagtions needing admits late 1990s >Positive skin testing for allergies - 1998 by Dr Sidney Ace >Normal spirometry on advair/singulair - july 2010 while symptomatic  #APNEA, SLEEP (ICD-780.57)....Dr. Vassie Loll  Past Surgical History: Last updated: 07/11/2009 Colonoscopy 06/2009: Pandiverticulosis. Dr Loreta Ave s/p pulmonary vein isolation 11/08/08 at [facility] ABI = 1.99 - 03/31/2001,  Cardiac cath - 08/11/2005, Cardiac Cath- N Ef, mild CAD - 04/13/2002,  Cardiolite - scar at apical/lat wall, pos ischemia ant/anteroseptal wall - 07/20/2005,  Cholecystectomy -,  Flex sigmoid - nl - 06/02/2005,  Nissen Fundoplication 1/98 -,  R Breast Bx - sclerosing adenosis 5/93 -,  R Knee Arthroscopy - 10/12/1998, Sleep study -[facility] - 04/13/2004  Family History: Last updated: 06-26-2006 Father is deceased at 29 yo with DM and HTN., Mom died during a hysterectomy d/t a bleeding complication.  Social History: Last updated: 06-26-2006 Husband died 12-01-22, lives w/ Daughter-Diana Kohl. Nonsmoker, doesn't drink.  Involved in non-denominational [employer]. Not working  Risk Factors: Alcohol Use: 0 (01/14/2010) Diet: trying to eat healthy (01/14/2010) Exercise: yes (09/23/2009)  Risk Factors: Smoking Status: never  (01/14/2010) Passive Smoke Exposure: no (01/14/2010)  Review of Systems       per hpi   Physical Exam  General:  Well-developed,well-nourished,in no acute distress; alert,appropriate and cooperative throughout examination. vitals reviewed.  Lungs:  some exp wheezing, good resp effort Heart:  Normal rate and regular rhythm. S1 and S2 normal without gallop, murmur, click, rub or other extra sounds. no bruit.  Abdomen:  Bowel sounds positive,abdomen soft and non-tender without masses, organomegaly or hernias noted. Pulses:  R and L carotid,radial,l,dorsalis pedis  pulses are full and equal bilaterally Extremities:  No clubbing, cyanosis, edema, or deformity noted with normal full range of motion of all joints.     Impression & Recommendations:  Problem # 1:  ANEMIA, NORMOCYTIC (ICD-285.9) Assessment New Low ferritin, low % sat, normal TIBC, RDW high.  Colonoscopy 2006 was normal with repeat 79yrs following.  No weight loss.  Pap 2009 was negative.  No vag bleed.  Will try FeSO4 and recheck in 6 months.  Her updated medication list for this problem includes:    Ferrous Sulfate 325 (65 Fe) Mg Tabs (Ferrous sulfate) .Marland Kitchen... 1 tab by mouth two times a day for iron deficiency  Orders: [facility]- Est  Level 4 ([address]109)  Problem # 2:  MYALGIA (ICD-729.1) Assessment: New Pt having myalgia x months.  ESR elevated at 70.  Dr Earnest Bailey Rx prednisone 50mg  x 5 days.  It may help her myalgia.  Pt to rtc for f/u.  Orders: [facility]- Est  Level 4 (60454)  Problem # 3:  DIABETES MELLITUS II, UNCOMPLICATED (ICD-250.00) Assessment: Unchanged A1C 6.6.  Pt not tolerating 1000mg  glucophage in PM.  She has decided to decrease glucophage to 500mg  two times a day.  Will continue with this.  If A1C is above goal of 7 at next check, can consider increasing glucotrol to 10mg  two times a day.   Her updated medication list for this problem includes:    Cozaar 50 Mg Tabs (Losartan potassium) .Marland Kitchen... 1 tablet  by mouth daily  daily    Glucophage 500 Mg Tabs (Metformin hcl) .Marland Kitchen... 1 tab by mouth two times a day for diabetes    Glucotrol 5 Mg Tabs (Glipizide) ..... One tablet by mouth twice a day for diabetes  Orders: [facility]- Est  Level 4 (09811)  Complete Medication List: 1)  Advair Diskus 250-50 Mcg/dose Aepb (Fluticasone-salmeterol) .... Inhale 1 puff two times a day 2)  Loratadine 10 Mg Tabs (Loratadine) .... Take 1 tablet by mouth once a day 3)  Cozaar 50 Mg Tabs (Losartan potassium) .Marland Kitchen.. 1 tablet  by mouth daily daily 4)  Coumadin 5 Mg Tabs (Warfarin sodium) .... Take as directed 5)  Flonase 50 Mcg/act Susp (Fluticasone propionate) .... Spray 1 spray into both nostrils twice a day 6)  Nexium 40 Mg Cpdr (Esomeprazole magnesium) .... One by mouth daily 7)  Singulair 10 Mg Tabs (Montelukast sodium) .... Take 1 tablet by mouth once a day per dr Delford Field 8)  Xopenex Hfa 45 Mcg/act Aero (Levalbuterol tartrate) .... Inhale 2 puff using inhaler every four hours as needed 9)  Tikosyn Caps (dofetilide)  .... One by mouth two times a day per dr fitzgerald 10)  Cpap 11cmh20 Qhs  11)  Xopenex 0.63 Mg/75ml Nebu (Levalbuterol hcl) .... One in nebulizer four times daily as needed per dr Delford Field 12)  Pravastatin Sodium 20 Mg Tabs (Pravastatin sodium) .Marland Kitchen.. 1 once daily 13)  Diabetic Shoes  .... Wear regularly 14)  Glucophage 500 Mg Tabs (Metformin hcl) .Marland Kitchen.. 1 tab by mouth two times a day for diabetes 15)  Glucotrol 5 Mg Tabs (Glipizide) .... One tablet by mouth twice a day for diabetes 16)  Diltiazem Hcl Er Beads 300 Mg Xr24h-cap (Diltiazem hcl er beads) .... Once daily per dr little 17)  Hydrochlorothiazide 25 Mg Tabs (Hydrochlorothiazide) .... 1/2 tab by mouth daily for blood pressure 18)  Furosemide 40 Mg Tabs (Furosemide) .... Take one three times per week as needed per dr little 19)  Lotrimin Af 1 % Crea (Clotrimazole) .... Apply to affected areas two times a day. dispense 30 grams. 20)  Prednisone 50 Mg Tabs  (Prednisone) .... One tab daily for 5 days 21)  Vicodin 5-500 Mg Tabs (Hydrocodone-acetaminophen) .... One tablet every 6 hours as needed for pain 22)  Ferrous Sulfate 325 (65 Fe) Mg Tabs (Ferrous sulfate) .Marland Kitchen.. 1 tab by mouth two times a day for iron deficiency  Other Orders: Influenza Vaccine NON MCR (91478)  Patient Instructions: 1)  Please schedule a follow-up appointment end of November for diabetes. 2)  Take the prednisone 50mg  that Dr Earnest Bailey: this is for asthma and polyarthritis. 3)  You need to take Iron two times a day for iron deficiency.  This may make you constipated so take stool softener two times a day.   Prescriptions: FERROUS SULFATE 325 (65 FE) MG TABS (FERROUS SULFATE) 1 tab by mouth two times a day for iron deficiency  #60 x 6   Entered and Authorized by:   Angeline Slim MD   Signed by:   Angeline Slim MD on 01/14/2010   Method used:   Historical   RxID:   [id number] GLUCOPHAGE 500 MG TABS (METFORMIN HCL) 1 tab by mouth two times a day for diabetes  #60 x 3   Entered and Authorized by:   Angeline Slim MD   Signed by:   Angeline Slim MD on 01/14/2010   Method used:   Historical   RxID:   [id number]    Prevention & Chronic Care Immunizations   Influenza vaccine: Fluvax Non-MCR  (01/14/2010)   Influenza vaccine due: 01/08/2009    Tetanus booster: 01/11/2001: Done.   Tetanus booster due: 01/12/2011    Pneumococcal vaccine: Done.  (01/11/2001)   Pneumococcal vaccine due: None  Colorectal Screening   Hemoccult: Done.  (03/18/2005)   Hemoccult due: Not Indicated    Colonoscopy: Results:  PanDiverticulosis.  Small internal hemorroids  LOCATION: [facility].  Dr Loreta Ave        (07/05/2009)   Colonoscopy action/deferral: Repeat colonoscopy in 10 years.    (07/05/2009)   Colonoscopy due: 07/2019  Other Screening   Pap smear: normal  (05/13/2007)   Pap smear action/deferral: Deferred-3 yr interval  (04/08/2009)   Pap smear due: 05/12/2010    Mammogram: ASSESSMENT:  Negative - BI-RADS 1^MM DIGITAL SCREENING  (03/27/2009)   Mammogram due: 03/27/2010   Smoking status: never  (01/14/2010)  Diabetes Mellitus   HgbA1C: 6.6  (12/31/2009)   Hemoglobin A1C due: 03/08/2008    Eye exam: normal  (08/13/2008)   Eye exam due: 08/2009    Foot exam: yes  (09/23/2009)   High risk foot: Not documented   Foot care education: Not documented   Foot exam due: 06/27/2008    Urine microalbumin/creatinine ratio: Not documented   Urine microalbumin/cr due: 12/06/2008    Diabetes flowsheet reviewed?: Yes   Progress toward A1C goal: At goal  Lipids   Total Cholesterol: 162  (12/31/2009)   LDL: 93  (12/31/2009)   LDL Direct: 94  (12/07/2007)   HDL: 58  (12/31/2009)   Triglycerides: 53  (12/31/2009)    SGOT (AST): 16  (12/31/2009)   SGPT (ALT): 22  (12/31/2009)   Alkaline phosphatase: 69  (12/31/2009)   Total bilirubin: 0.4  (12/31/2009)    Lipid flowsheet reviewed?: Yes   Progress toward LDL goal: At goal  Hypertension   Last Blood Pressure: 117 / 83  (01/14/2010)   Serum creatinine: 0.86  (12/31/2009)   Serum potassium 3.9  (12/31/2009)    Hypertension flowsheet reviewed?: Yes   Progress toward BP goal: At goal  Self-Management Support :   Personal Goals (by the next clinic visit) :     Personal A1C goal: 8  (06/19/2009)     Personal blood pressure goal: 140/90  (05/07/2009)     Personal LDL goal: 100  (04/08/2009)    Diabetes self-management support: Written self-care plan  (09/23/2009)    Hypertension self-management support: Written self-care plan  (  09/23/2009)    Lipid self-management support: Written self-care plan  (09/23/2009)    Nursing Instructions: Give Flu vaccine today    Immunizations Administered:  Influenza Vaccine # 1:    Vaccine Type: Fluvax Non-MCR    Site: left deltoid    Mfr: Sanofi Pasteur    Dose: 0.5 ml    Route: IM    Given by: Jimmy Footman, CMA    Exp. Date: 10/08/2010    Lot #: [id number]    VIS given:  11/05/09 version given January 14, 2010.  Flu Vaccine Consent Questions:    Do you have a history of severe allergic reactions to this vaccine? no    Any prior history of allergic reactions to egg and/or gelatin? no    Do you have a sensitivity to the preservative Thimersol? no    Do you have a past history of Guillan-Barre Syndrome? no    Do you currently have an acute febrile illness? no    Have you ever had a severe reaction to latex? no    Vaccine information given and explained to patient? yes    Are you currently pregnant? no

## 2010-05-13 NOTE — Progress Notes (Signed)
Summary: CXR results - bronchopneumonia  Phone Note From Other Clinic   Caller: Dr. Kyung Rudd at [facility] Call For: [facility] Summary of Call: Spoke with Dr. Kyung Rudd.  She is calling to inform that pt's CXR shows bronchopneumonia in RLL.  Calling to inform of this d/t pt having sxs of wheezing and cough.  Pt was seen today by TP -- will forward message to TP to address.  Thanks! Initial call taken by: Gweneth Dimitri RN,  January 21, 2010 4:40 PM  Follow-up for Phone Call        rx sent to pharm. talked w/ pt.  Follow-up by: Tammy Parrett NP,  January 21, 2010 5:23 PM    New/Updated Medications: AVELOX 400 MG TABS (MOXIFLOXACIN HCL) 1 by mouth once daily Prescriptions: AVELOX 400 MG TABS (MOXIFLOXACIN HCL) 1 by mouth once daily  #7 x 0   Entered and Authorized by:   Rubye Oaks NP   Signed by:   Tammy Parrett NP on 01/21/2010   Method used:   Electronically to        [facility]  [address] [id number] (retail)       [address]       [place], [place]  [address]       Ph: [phone] or [phone]       Fax: [phone]   RxID:   [id number]

## 2010-05-13 NOTE — Progress Notes (Signed)
Summary: no prior auth required for Budesonide and Brovanna  Phone Note From Pharmacy   Caller: [facility]  [address] [id number] Call For: PRIOR AUTH FOR BROVANNA AND BUDESONIDE  Summary of Call: PER INS CO [phone]  NO PRIOR AUTH REQUIRED, NEED TO FILE UNDER PART B. [facility] [address] INFORMED .Kandice Hams [facility]  March 20, 2010 5:07 PM  Initial call taken by: Kandice Hams CMA,  March 20, 2010 5:07 PM

## 2010-05-13 NOTE — Miscellaneous (Signed)
Summary: CT Chest   Clinical Lists Changes  Observations: Added new observation of CT OF CHEST: Findings: There may be small low attenuation nodules in the left lobe of the thyroid, which are nonspecific.  Mediastinal lymph nodes are not enlarged by CT size criteria.  Mild bihilar lymphoid tissue.  No axillary adenopathy.  Pulmonary arteries are enlarged. Heart is enlarged as well.  No pericardial effusion.   There is pulmonary parenchymal mosaic attenuation.  Calcified granuloma in the left upper lobe.  Clustered peribronchovascular micro nodularity in the right upper lobe (image 15) is likely infectious or post infectious in etiology.  Linear scarring and volume loss in the right middle lobe.  There  may be dependent pleural thickening bilaterally.  No pleural fluid.  Airway is unremarkable.   Incidental imaging of the upper abdomen shows no acute findings. No worrisome lytic or sclerotic lesions.   IMPRESSION:   1.  Small area of clustered peribronchovascular nodularity in the right upper lobe may be infectious or post infectious in etiology. 2.  Right middle lobe scarring. 3.  Pulmonary arterial hypertension. 4.  Pulmonary parenchymal mosaic attenuation can be seen with small airways disease.   (03/10/2010 14:54)      CT of Chest  Procedure date:  03/10/2010  Findings:      Findings: There may be small low attenuation nodules in the left lobe of the thyroid, which are nonspecific.  Mediastinal lymph nodes are not enlarged by CT size criteria.  Mild bihilar lymphoid tissue.  No axillary adenopathy.  Pulmonary arteries are enlarged. Heart is enlarged as well.  No pericardial effusion.   There is pulmonary parenchymal mosaic attenuation.  Calcified granuloma in the left upper lobe.  Clustered peribronchovascular micro nodularity in the right upper lobe (image 15) is likely infectious or post infectious in etiology.  Linear scarring and volume loss in the right  middle lobe.  There  may be dependent pleural thickening bilaterally.  No pleural fluid.  Airway is unremarkable.   Incidental imaging of the upper abdomen shows no acute findings. No worrisome lytic or sclerotic lesions.   IMPRESSION:   1.  Small area of clustered peribronchovascular nodularity in the right upper lobe may be infectious or post infectious in etiology. 2.  Right middle lobe scarring. 3.  Pulmonary arterial hypertension. 4.  Pulmonary parenchymal mosaic attenuation can be seen with small airways disease.

## 2010-05-13 NOTE — Letter (Signed)
Summary: Cardiology/[facility]  Cardiology/[facility]   Imported By: Sherian Rein 09/30/2009 13:16:11  _____________________________________________________________________  External Attachment:    Type:   Image     Comment:   External Document

## 2010-05-13 NOTE — Assessment & Plan Note (Signed)
Summary: f/up,tcb   Vital Signs:  Patient profile:   60 year old female Height:      67 inches Weight:      213 pounds BMI:     33.48 Temp:     98.1 degrees F oral Pulse rate:   73 / minute BP sitting:   122 / 78  (left arm) Cuff size:   regular  Vitals Entered By: Tessie Fass CMA (December 04, 2009 8:49 AM) CC: F/U DM Is Patient Diabetic? Yes   Primary Care Adaiah Jaskot:  Cat Ta MD  CC:  F/U DM.  History of Present Illness: 60 y/o F here for DM f/u  DIABETES Meds: glucophage 500mg  in AM, 1000mg  in PM, Glucotrol 5mg  bid Compliance : yes Diet: lost 2 lbs since last visit Lightheadedness:no    Dizziness: no   Confusio; no    Shakiness: no   Abd  pain:no   Nausea:no    Vomiting: no A1C 6.9 in June   Shoulder pain Left x 2 weeks:no  injury. hurts to lift. hurts to raise overhead. hurts at night. no swelling. no redness. cannot lay on it at night to sleep. has not taken meds for it.   Rash x 2 month: On legs. non puritic, no pustules, no redness, darker color than skin.  not on other parts of body.  no new products (shampoo, soap, detergent, etc). no fever no chills  Habits & Providers  Alcohol-Tobacco-Diet     Tobacco Status: never  Current Medications (verified): 1)  Advair Diskus 250-50 Mcg/dose Aepb (Fluticasone-Salmeterol) .... Inhale 1 Puff Two Times A Day 2)  Loratadine 10 Mg Tabs (Loratadine) .... Take 1 Tablet By Mouth Once A Day 3)  Cozaar 50 Mg Tabs (Losartan Potassium) .Marland Kitchen.. 1 Tablet  By Mouth Daily Daily 4)  Coumadin 5 Mg Tabs (Warfarin Sodium) .... Take As Directed 5)  Flonase 50 Mcg/act Susp (Fluticasone Propionate) .... Spray 1 Spray Into Both Nostrils Twice A Day 6)  Nexium 40 Mg Cpdr (Esomeprazole Magnesium) .... One By Mouth Daily 7)  Singulair 10 Mg Tabs (Montelukast Sodium) .... Take 1 Tablet By Mouth Once A Day Per Dr Delford Field 8)  Xopenex Hfa 45 Mcg/act Aero (Levalbuterol Tartrate) .... Inhale 2 Puff Using Inhaler Every Four Hours As Needed 9)  Tikosyn   Caps (Dofetilide) .... One By Mouth Two Times A Day Per Dr Sampson Goon 10)  Cpap 11cmh20 Qhs 11)  Xopenex 0.63 Mg/44ml  Nebu (Levalbuterol Hcl) .... One in Nebulizer Four Times Daily As Needed Per Dr Delford Field 12)  Pravastatin Sodium 20 Mg Tabs (Pravastatin Sodium) .Marland Kitchen.. 1 Once Daily 13)  Diabetic Shoes .... Wear Regularly 14)  Glucophage 1000 Mg Tabs (Metformin Hcl) .... One-Half  Tablet By Mouth in Am and Full Tablet By Mouth in Pm 15)  Glucotrol 5 Mg Tabs (Glipizide) .... One Tablet By Mouth Twice A Day For Diabetes 16)  Diltiazem Hcl Er Beads 300 Mg Xr24h-Cap (Diltiazem Hcl Er Beads) .... Once Daily Per Dr Clarene Duke 17)  Hydrochlorothiazide 25 Mg Tabs (Hydrochlorothiazide) .... 1/2 Tab By Mouth Daily For Blood Pressure 18)  Furosemide 40 Mg Tabs (Furosemide) .... Take One Three Times Per Week As Needed Per Dr Clarene Duke 19)  Lotrimin Af 1 % Crea (Clotrimazole) .... Apply To Affected Areas Two Times A Day. Dispense 30 Grams.  Allergies (verified): 1)  Ace Inhibitors  Past History:  Past Medical History: Last updated: 09/28/2009 UPDATED 10/30/2008 after review of old paper chart dating back to late 1990s  H. pylori negative 07/2001, Menopause at 60 y/o, on CPAP at night, sinus bradycardia with arrythmia noted on 01/21/01  #DISEASE, VOCAL CORD NEC (ICD-478.5)............DR Delford Field >mentioned in chart > Was followed by Dr. Annalee Genta ENT in late 1990s - lary showed findings c/w GERD > Was also followed by [facility] at [facility]. VCD suspected there. s/p speech therap in late 1990s  ACEi Intolerant OBESITY, NOS (ICD-278.00) LEG PAIN OR KNEE PAIN (ICD-729.5) HYPERTENSION, BENIGN SYSTEMIC (ICD-401.1) HYPERCHOLESTEROLEMIA (ICD-272.0) GASTROESOPHAGEAL REFLUX, NO ESOPHAGITIS (ICD-530.81) DIABETES MELLITUS II, UNCOMPLICATED (ICD-250.00) DEPRESSION, MAJOR, RECURRENT (ICD-296.30) ATRIAL FIBRILLATION (ICD-427.31)................[facility], Dr Clarene Duke >PAF. s/p ablation >s/p pulmonary vein isolation  11/08/08 >Followed by [facility] ASTHMA, UNSPECIFIED (ICD-493.90)....................DR Delford Field >charts mention "moderare persistent" severity.  >Last PFT was late 1990s per patient hx but in review  of paper chart Dr Marchelle Gearing could not find it july 2010 >Frequent exacerbagtions needing admits late 1990s >Positive skin testing for allergies - 1998 by Dr Sidney Ace >Normal spirometry on advair/singulair - july 2010 while symptomatic  #APNEA, SLEEP (ICD-780.57)....Dr. Vassie Loll  Past Surgical History: Last updated: 07/11/2009 Colonoscopy 06/2009: Pandiverticulosis. Dr Loreta Ave s/p pulmonary vein isolation 11/08/08 at [facility] ABI = 1.99 - 03/31/2001,  Cardiac cath - 08/11/2005, Cardiac Cath- N Ef, mild CAD - 04/13/2002,  Cardiolite - scar at apical/lat wall, pos ischemia ant/anteroseptal wall - 07/20/2005,  Cholecystectomy -,  Flex sigmoid - nl - 06/02/2005,  Nissen Fundoplication 1/98 -,  R Breast Bx - sclerosing adenosis 5/93 -,  R Knee Arthroscopy - 10/12/1998, Sleep study -OSA - 04/13/2004  Family History: Last updated: Jun 12, 2006 Father is deceased at 74 yo with DM and HTN., Mom died during a hysterectomy d/t a bleeding complication.  Social History: Last updated: 12-Jun-2006 Husband died 2022-11-17, lives w/ Daughter-Diana Hirota. Nonsmoker, doesn't drink.  Involved in non-denominational [employer]. Not working  Risk Factors: Alcohol Use: 0 (10/15/2009) Diet: trying to eat healthy (12/19/2008) Exercise: yes (09/23/2009)  Risk Factors: Smoking Status: never (12/04/2009) Passive Smoke Exposure: no (10/22/2009)  Review of Systems       per hpi   Physical Exam  General:  Well-developed,well-nourished,in no acute distress; alert,appropriate and cooperative throughout examination. vitals reviewed.  Head:  normocephalic and atraumatic.   Lungs:  Normal respiratory effort, chest expands symmetrically. Lungs are clear to auscultation, no crackles or wheezes. Heart:  Normal rate  and regular rhythm. S1 and S2 normal without gallop, murmur, click, rub or other extra sounds. Msk:  Left Shoulder: Inspection reveals no abnormalities, atrophy or asymmetry. Palpation is normal with no tenderness over AC joint or bicipital groove. Dec ROM is full in all planes. Rotator cuff strength decreased throughout. + empty can. No labral pathology noted with negative Obrien's, negative clunk and good stability. Normal scapular function observed. No painful arc and no drop arm sign. No apprehension sign    Extremities:  No clubbing, cyanosis, edema, or deformity noted with normal full range of motion of all joints.   Neurologic:  alert & oriented X3.   Skin:  bilateral legs with hyperpigmented macules on anterior surfaces of legs distal to knees.  no ecchymoses, no petechiae, no purpura, no ulcerations, and no edema.      Impression & Recommendations:  Problem # 1:  DIABETES MELLITUS II, UNCOMPLICATED (ICD-250.00)  Her updated medication list for this problem includes:    Cozaar 50 Mg Tabs (Losartan potassium) .Marland Kitchen... 1 tablet  by mouth daily daily    Glucophage 1000 Mg Tabs (Metformin hcl) ..... One-half  tablet by mouth in  am and full tablet by mouth in pm    Glucotrol 5 Mg Tabs (Glipizide) ..... One tablet by mouth twice a day for diabetes  Orders: [facility]- Est  Level 4 (99214)Future Orders: Comp Met-[facility] ([id number]) ... 11/15/2010 A1C-[facility] (10272) ... 11/21/2010  Problem # 2:  SKIN RASH (ICD-782.1) Assessment: New DDx include fungal vs dermatitis vs seheborris keratosis , but no puritis.  Will try lotrimin cream.   Her updated medication list for this problem includes:    Lotrimin Af 1 % Crea (Clotrimazole) .Marland Kitchen... Apply to affected areas two times a day. dispense 30 grams.  Orders: [facility]- Est  Level 4 (53664)  Problem # 3:  SHOULDER PAIN, LEFT (ICD-719.41) Assessment: New Will get xray. Ddx frozen shoulder vs rotator cuff strain vs muscle strain.  Will try shoulder  exercises.  Orders: Radiology other (Radiology Other) [facility]- Est  Level 4 (40347)  Complete Medication List: 1)  Advair Diskus 250-50 Mcg/dose Aepb (Fluticasone-salmeterol) .... Inhale 1 puff two times a day 2)  Loratadine 10 Mg Tabs (Loratadine) .... Take 1 tablet by mouth once a day 3)  Cozaar 50 Mg Tabs (Losartan potassium) .Marland Kitchen.. 1 tablet  by mouth daily daily 4)  Coumadin 5 Mg Tabs (Warfarin sodium) .... Take as directed 5)  Flonase 50 Mcg/act Susp (Fluticasone propionate) .... Spray 1 spray into both nostrils twice a day 6)  Nexium 40 Mg Cpdr (Esomeprazole magnesium) .... One by mouth daily 7)  Singulair 10 Mg Tabs (Montelukast sodium) .... Take 1 tablet by mouth once a day per dr Delford Field 8)  Xopenex Hfa 45 Mcg/act Aero (Levalbuterol tartrate) .... Inhale 2 puff using inhaler every four hours as needed 9)  Tikosyn Caps (dofetilide)  .... One by mouth two times a day per dr fitzgerald 10)  Cpap 11cmh20 Qhs  11)  Xopenex 0.63 Mg/40ml Nebu (Levalbuterol hcl) .... One in nebulizer four times daily as needed per dr Delford Field 12)  Pravastatin Sodium 20 Mg Tabs (Pravastatin sodium) .Marland Kitchen.. 1 once daily 13)  Diabetic Shoes  .... Wear regularly 14)  Glucophage 1000 Mg Tabs (Metformin hcl) .... One-half  tablet by mouth in am and full tablet by mouth in pm 15)  Glucotrol 5 Mg Tabs (Glipizide) .... One tablet by mouth twice a day for diabetes 16)  Diltiazem Hcl Er Beads 300 Mg Xr24h-cap (Diltiazem hcl er beads) .... Once daily per dr little 17)  Hydrochlorothiazide 25 Mg Tabs (Hydrochlorothiazide) .... 1/2 tab by mouth daily for blood pressure 18)  Furosemide 40 Mg Tabs (Furosemide) .... Take one three times per week as needed per dr little 19)  Lotrimin Af 1 % Crea (Clotrimazole) .... Apply to affected areas two times a day. dispense 30 grams.  Other Orders: Future Orders: Lipid-[facility] ([id number]) ... 11/22/2010 CBC-[facility] ([id number]) ... 11/14/2010  Patient Instructions: 1)  Please schedule a  follow-up appointment in 3 months with Dr Janalyn Harder. 2)  Please make appt for cholesterol check.  This has to be fasting, so no food or drink after midnight the night before appointment.  Schedule this for after Sept 16. 3)  It is important that you exercise reguarly at least 20 minutes 5 times a week. If you develop chest pain, have severe difficulty breathing, or feel very tired, stop exercising immediately and seek medical attention.  4)  New medicine: Lotrimin cream for rash on your legs. Use two times a day. 5)  Follow the shoulder exercises that I gave you.  Come back if not improved.  Get  the xray.  I will call you with result.  Prescriptions: LOTRIMIN AF 1 % CREA (CLOTRIMAZOLE) Apply to affected areas two times a day. Dispense 30 grams.  #1 x 0   Entered and Authorized by:   Angeline Slim MD   Signed by:   Angeline Slim MD on 12/04/2009   Method used:   Electronically to        [facility]  [address] [id number] (retail)       [address]       [place], [place]  [address]       Ph: [phone] or [phone]       Fax: [phone]   RxID:   [id number] GLUCOTROL 5 MG TABS (GLIPIZIDE) one tablet by mouth twice a day for diabetes  #60 x 6   Entered and Authorized by:   Angeline Slim MD   Signed by:   Angeline Slim MD on 12/04/2009   Method used:   Electronically to        [facility]  [address] [id number] (retail)       [address]       [place], [place]  [address]       Ph: [phone] or [phone]       Fax: [phone]   RxID:   [id number]      Prevention & Chronic Care Immunizations   Influenza vaccine: Fluvax MCR  (01/21/2009)   Influenza vaccine due: 01/08/2009    Tetanus booster: 01/11/2001: Done.   Tetanus booster due: 01/12/2011    Pneumococcal vaccine: Done.  (01/11/2001)   Pneumococcal vaccine due: None  Colorectal Screening   Hemoccult: Done.  (03/18/2005)   Hemoccult due: Not Indicated    Colonoscopy: Results:  PanDiverticulosis.  Small internal  hemorroids  LOCATION: [facility].  Dr Loreta Ave        (07/05/2009)   Colonoscopy action/deferral: Repeat colonoscopy in 10 years.    (07/05/2009)   Colonoscopy due: 07/2019  Other Screening   Pap smear: normal  (05/13/2007)   Pap smear action/deferral: Deferred-3 yr interval  (04/08/2009)   Pap smear due: 05/12/2010    Mammogram: ASSESSMENT: Negative - BI-RADS 1^MM DIGITAL SCREENING  (03/27/2009)   Mammogram due: 03/27/2010   Smoking status: never  (12/04/2009)  Diabetes Mellitus   HgbA1C: 6.9  (09/23/2009)   Hemoglobin A1C due: 03/08/2008    Eye exam: normal  (08/13/2008)   Eye exam due: 08/2009    Foot exam: yes  (09/23/2009)   High risk foot: Not documented   Foot care education: Not documented   Foot exam due: 06/27/2008    Urine microalbumin/creatinine ratio: Not documented   Urine microalbumin/cr due: 12/06/2008    Diabetes flowsheet reviewed?: Yes   Progress toward A1C goal: At goal  Lipids   Total Cholesterol: 140  (12/27/2008)   LDL: 80  (12/27/2008)   LDL Direct: 94  (12/07/2007)   HDL: 52  (12/27/2008)   Triglycerides: 41  (12/27/2008)    SGOT (AST): 15  (04/30/2009)   SGPT (ALT): 18  (04/30/2009) CMP ordered    Alkaline phosphatase: 83  (04/30/2009)   Total bilirubin: 0.2  (04/30/2009)    Lipid flowsheet reviewed?: Yes   Progress toward LDL goal: At goal  Hypertension   Last Blood Pressure: 122 / 78  (12/04/2009)   Serum creatinine: 0.83  (04/30/2009)   Serum potassium 4.0  (04/30/2009) CMP ordered     Hypertension  flowsheet reviewed?: Yes   Progress toward BP goal: At goal  Self-Management Support :   Personal Goals (by the next clinic visit) :     Personal A1C goal: 8  (06/19/2009)     Personal blood pressure goal: 140/90  (05/07/2009)     Personal LDL goal: 100  (04/08/2009)    Diabetes self-management support: Written self-care plan  (09/23/2009)    Hypertension self-management support: Written self-care plan  (09/23/2009)     Lipid self-management support: Written self-care plan  (09/23/2009)

## 2010-05-13 NOTE — Consult Note (Signed)
Summary: Nutrition & DM  Nutrition & DM   Imported By: De Nurse 01/15/2010 16:46:22  _____________________________________________________________________  External Attachment:    Type:   Image     Comment:   External Document

## 2010-05-13 NOTE — Miscellaneous (Signed)
Summary: teeth extraction & anticoagulants   Clinical Lists Changes Angie with [facility] to know right away if pt can come off her asa & coumadin for teeth cleaning & extractions. plz call her at [phone].Golden Circle RN  June 26, 2009 3:37 PM  Spoke with patient.  We discussed going off coumadin for 5 days for extraction, but that she would be at 5% risk of stroke given A fib.  Pt has been authorized by Dr Clarene Duke to go off coumadin for 5 days for colonoscopy.  Unfortunately her dentist office does not have avail appt to fit into her schedule.  I called Angie at [facility] at above number, but I got voicemail for insurance company. Diane Vo MD  June 26, 2009 4:53 PM  lm for angie to call me back.Golden Circle RN  June 27, 2009 10:23 AM  when the dental clinic calls me back, ok to be off coumadin x 5 days prior for the extractions?...Marland KitchenMarland KitchenGolden Circle RN  June 28, 2009 11:26 AM  Would be ok for pt to off coumadin for 5 days for extractions.  Ideally this would be done the same time that pt is off coumadin for colonscopy.  Diane Chowning MD  July 01, 2009 11:37 AM  letter faxed to [facility] [phone].Golden Circle RN  July 01, 2009 11:52 AM

## 2010-05-13 NOTE — Assessment & Plan Note (Signed)
Summary: Acute NP office visit - hx PNA   Copy to:  Dr. Shan Levans Primary Provider/Referring Provider:  Cat Ta MD  CC:  prod cough with white mucus, wheezing, increased SOB, hoarseness, and body aches/fatigues x1week - states she feels like she has never gotten back to baseline since dx'd with PNA 1 month ago.  History of Present Illness: 60  year-old, African-American  female, history of moderate persistent asthma and reflux disease.  Hx of VCD and cyclic cough.  Hx GERD  October 15, 2009 10:00 AM The pt is better vs May OV with cough.  The  dyspnea is better and is sleeping better.  No new issues.   November 19, 2009--Presents for an acute office visit. Complains of wheezing, increased SOB, dry cough, chest congestion onset last night. Symptoms started last night, worse this am. Cough is dry, no discolored mucus. Cough is aggravating. Denies chest pain,  orthopnea, hemoptysis, fever, n/v/d, edema, headache.   December 09, 2009 9:20 AM The pt had another flare up of VCD and bronchitis on  11/19/09 and saw NP.   The pt was rx with  qvar and dexilant.  The pt is now is better.  The pt is holding advair at present.   January 21, 2010 --Presents for an acute office visit. Complains of  Increased sob and wheezing since last night. Complains she had trouble sleeping due to wheezing. Has on/off again wheezing at night for 3 weeks. Has occasional  productive  cough with thick white mucus. with  chest tightness and wheezing. Used  used neb 2 hrs ago. Was seen recently for arthritis flare and leg pain tx w/ steroids. She finished Prednisone 1 week ago for leg pain. Denies chest pain, orthopnea, hemoptysis, fever, n/v/d, edema, headache.   January 28, 2010 10:01 AM Pt feels better and had RLL PNA.  Pt rx with avelox x 7days and is now better. Symptoms are improved. There is less cough and wheeze. Dyspnea is largely better.  No other issues noted CXR today is better February 05, 2010 10:34 AM  The pt  started wiht more cough and awakens from sleep dyspneic.    Notes some white and green mucus.  No real sinus congestion Notes more dyspnea.  No fever except at night.  Notes sweats at night.  Sl heartburn into back and worse with coughing. The pt was better from RML PNA two weeks ago with CXR clearance ,  now worse.  February 14, 2010 11:31 AM cough is better.  no wheeze.  no sleep issues.  No discolored mucus.     March 03, 2010 --Presents for an acute office visit. Complains of prod cough with white mucus, wheezing, increased SOB, hoarseness, body aches/fatigues x1week - states she feels like she has never gotten back to baseline since dx'd with PNA 1 month ago. Last xray showed near complete resolution of RLL opacity.  Cough is minimally productive, no discoloration , no feer. Feels tired all the time. no energy. cough is not going away. Denies chest pain,  orthopnea, hemoptysis, fever, n/v/d, edema, headache, no weight loss.   Medications Prior to Update: 1)  Advair Diskus 250-50 Mcg/dose Aepb (Fluticasone-Salmeterol) .... Inhale 1 Puff Two Times A Day 2)  Loratadine 10 Mg Tabs (Loratadine) .... Take 1 Tablet By Mouth Once A Day 3)  Cozaar 50 Mg Tabs (Losartan Potassium) .Marland Kitchen.. 1 Tablet  By Mouth Daily Daily 4)  Coumadin 5 Mg Tabs (Warfarin Sodium) .... Take  As Directed 5)  Flonase 50 Mcg/act Susp (Fluticasone Propionate) .... Spray 1 Spray Into Both Nostrils Twice A Day 6)  Nexium 40 Mg Cpdr (Esomeprazole Magnesium) .... One By Mouth Daily 7)  Singulair 10 Mg Tabs (Montelukast Sodium) .... Take 1 Tablet By Mouth Once A Day Per Dr Delford Field 8)  Tikosyn 250 Mcg Caps (Dofetilide) .Marland Kitchen.. 1 By Mouth Two Times A Day 9)  Cpap 11cmh20 Qhs 10)  Pravastatin Sodium 20 Mg Tabs (Pravastatin Sodium) .Marland Kitchen.. 1 Once Daily 11)  Diabetic Shoes .... Wear Regularly 12)  Glucophage 500 Mg Tabs (Metformin Hcl) .Marland Kitchen.. 1 Tab By Mouth Two Times A Day For Diabetes 13)  Glucotrol 5 Mg Tabs (Glipizide) .... One Tablet By  Mouth Twice A Day For Diabetes 14)  Diltiazem Hcl Er Beads 300 Mg Xr24h-Cap (Diltiazem Hcl Er Beads) .... Once Daily Per Dr Clarene Duke 15)  Ferrous Sulfate 325 (65 Fe) Mg Tabs (Ferrous Sulfate) .Marland Kitchen.. 1 Tab By Mouth Two Times A Day For Iron Deficiency 16)  Tessalon 200 Mg Caps (Benzonatate) .Marland Kitchen.. 1 By Mouth Three Times A Day As Needed Cough 17)  Xopenex Hfa 45 Mcg/act Aero (Levalbuterol Tartrate) .... Inhale 2 Puff Using Inhaler Every Four Hours As Needed 18)  Furosemide 40 Mg Tabs (Furosemide) .... Take One Three Times Per Week As Needed Per Dr Clarene Duke 19)  Xopenex 0.63 Mg/43ml  Nebu (Levalbuterol Hcl) .... One in Nebulizer Four Times Daily As Needed Per Dr Delford Field  Current Medications (verified): 1)  Advair Diskus 250-50 Mcg/dose Aepb (Fluticasone-Salmeterol) .... Inhale 1 Puff Two Times A Day 2)  Loratadine 10 Mg Tabs (Loratadine) .... Take 1 Tablet By Mouth Once A Day 3)  Cozaar 50 Mg Tabs (Losartan Potassium) .Marland Kitchen.. 1 Tablet  By Mouth Daily Daily 4)  Coumadin 5 Mg Tabs (Warfarin Sodium) .... Take As Directed 5)  Flonase 50 Mcg/act Susp (Fluticasone Propionate) .... Spray 1 Spray Into Both Nostrils Twice A Day 6)  Nexium 40 Mg Cpdr (Esomeprazole Magnesium) .... One By Mouth Daily 7)  Singulair 10 Mg Tabs (Montelukast Sodium) .... Take 1 Tablet By Mouth Once A Day Per Dr Delford Field 8)  Tikosyn 250 Mcg Caps (Dofetilide) .Marland Kitchen.. 1 By Mouth Two Times A Day 9)  Cpap 11cmh20 .... Wear At Bedtime 10)  Pravastatin Sodium 20 Mg Tabs (Pravastatin Sodium) .Marland Kitchen.. 1 Once Daily 11)  Diabetic Shoes .... Wear Regularly 12)  Glucophage 500 Mg Tabs (Metformin Hcl) .Marland Kitchen.. 1 Tab By Mouth Two Times A Day For Diabetes 13)  Glucotrol 5 Mg Tabs (Glipizide) .... One Tablet By Mouth Twice A Day For Diabetes 14)  Diltiazem Hcl Er Beads 300 Mg Xr24h-Cap (Diltiazem Hcl Er Beads) .... Once Daily Per Dr Clarene Duke 15)  Ferrous Sulfate 325 (65 Fe) Mg Tabs (Ferrous Sulfate) .Marland Kitchen.. 1 Tab By Mouth Two Times A Day For Iron Deficiency 16)  Tessalon 200  Mg Caps (Benzonatate) .Marland Kitchen.. 1 By Mouth Three Times A Day As Needed Cough 17)  Xopenex Hfa 45 Mcg/act Aero (Levalbuterol Tartrate) .... Inhale 2 Puff Using Inhaler Every Four Hours As Needed 18)  Furosemide 40 Mg Tabs (Furosemide) .... Take One Three Times Per Week As Needed Per Dr Clarene Duke 19)  Xopenex 0.63 Mg/81ml  Nebu (Levalbuterol Hcl) .... One in Nebulizer Four Times Daily As Needed Per Dr Delford Field  Allergies (verified): 1)  Ace Inhibitors  Past History:  Past Medical History: Last updated: 09/28/2009 UPDATED 10/30/2008 after review of old paper chart dating back to late 1990s  H. pylori negative 07/2001,  Menopause at 60 y/o, on CPAP at night, sinus bradycardia with arrythmia noted on 01/21/01  #DISEASE, VOCAL CORD NEC (ICD-478.5)............DR Delford Field >mentioned in chart > Was followed by Dr. Annalee Genta ENT in late 1990s - lary showed findings c/w GERD > Was also followed by Speech Clinic at [facility]. VCD suspected there. s/p speech therap in late 1990s  ACEi Intolerant OBESITY, NOS (ICD-278.00) LEG PAIN OR KNEE PAIN (ICD-729.5) HYPERTENSION, BENIGN SYSTEMIC (ICD-401.1) HYPERCHOLESTEROLEMIA (ICD-272.0) GASTROESOPHAGEAL REFLUX, NO ESOPHAGITIS (ICD-530.81) DIABETES MELLITUS II, UNCOMPLICATED (ICD-250.00) DEPRESSION, MAJOR, RECURRENT (ICD-296.30) ATRIAL FIBRILLATION (ICD-427.31)................[facility], Dr Clarene Duke >PAF. s/p ablation >s/p pulmonary vein isolation 11/08/08 >Followed by [facility] ASTHMA, UNSPECIFIED (ICD-493.90)....................DR Delford Field >charts mention "moderare persistent" severity.  >Last PFT was late 1990s per patient hx but in review  of paper chart Dr Marchelle Gearing could not find it july 2010 >Frequent exacerbagtions needing admits late 1990s >Positive skin testing for allergies - 1998 by Dr Sidney Ace >Normal spirometry on advair/singulair - july 2010 while symptomatic  #APNEA, SLEEP (ICD-780.57)....Dr. Vassie Loll  Past Surgical History: Last updated:  07/11/2009 Colonoscopy 06/2009: Pandiverticulosis. Dr Loreta Ave s/p pulmonary vein isolation 11/08/08 at [facility] ABI = 1.99 - 03/31/2001,  Cardiac cath - 08/11/2005, Cardiac Cath- N Ef, mild CAD - 04/13/2002,  Cardiolite - scar at apical/lat wall, pos ischemia ant/anteroseptal wall - 07/20/2005,  Cholecystectomy -,  Flex sigmoid - nl - 06/02/2005,  Nissen Fundoplication 1/98 -,  R Breast Bx - sclerosing adenosis 5/93 -,  R Knee Arthroscopy - 10/12/1998, Sleep study -[facility] - 04/13/2004  Family History: Last updated: 2006/06/11 Father is deceased at 5 yo with DM and HTN., Mom died during a hysterectomy d/t a bleeding complication.  Social History: Last updated: 06/11/06 Husband died 11/16/22, lives w/ Daughter-Diana Fugere. Nonsmoker, doesn't drink.  Involved in non-denominational [employer]. Not working  Risk Factors: Smoking Status: never (02/25/2010) Passive Smoke Exposure: no (02/05/2010)  Review of Systems      See HPI  Vital Signs:  Patient profile:   60 year old female Height:      66 inches Weight:      214 pounds BMI:     34.67 O2 Sat:      99 % on Room air Temp:     97.9 degrees F oral Pulse rate:   87 / minute BP sitting:   118 / 82  (left arm) Cuff size:   regular  Vitals Entered By: Boone Master CNA/MA (March 03, 2010 3:21 PM)  O2 Flow:  Room air CC: prod cough with white mucus, wheezing, increased SOB, hoarseness, body aches/fatigues x1week - states she feels like she has never gotten back to baseline since dx'd with PNA 1 month ago Is Patient Diabetic? Yes Comments Medications reviewed with patient Daytime contact number verified with patient. Boone Master CNA/MA  March 03, 2010 3:19 PM    Physical Exam  Additional Exam:  GEN: A/Ox3; pleasant , NAD HEENT:  [place]/AT, , EACs-clear, TMs-wnl, NOSE-clear, THROAT-clear NECK:  Supple w/ fair ROM; no JVD; normal carotid impulses w/o bruits; no thyromegaly or nodules palpated; no lymphadenopathy. RESP  Coarse BS w/ no  wheeizng noted.  CARD:  RRR, no m/r/g   GI:   Soft & nt; nml bowel sounds; no organomegaly or masses detected. [place]co: Warm bil,  no calf tenderness edema, clubbing, pulses intact Neuro: intact w/ no focal deficitis detected.    Impression & Recommendations:  Problem # 1:  BRONCHIAL PNEUMONIA (ICD-485) prolonged recovery w/ persistent resp symptoms will recheck xray to document complete  clearance and  tx for bronchitic symptoms Plan:  Prednisone taper over next week.  Mucinex DM two times a day  Tessalon three times a day  Hydromet 1-2 tsp every 4-6 hr as needed cough,may make you sleepy.  follow up Dr. Delford Field in 3-4 weeks and as needed  Please contact office for sooner follow up if symptoms do not improve or worsen  Orders: T-2 View CXR (71020TC) Est. Patient Level IV (81191)  Problem # 3:  DIABETES MELLITUS II, UNCOMPLICATED (ICD-250.00)  Her updated medication list for this problem includes:    Glucophage 500 Mg Tabs (Metformin hcl) .Marland Kitchen... 1 tab by mouth two times a day for diabetes    Glucotrol 5 Mg Tabs (Glipizide) ..... One tablet by mouth twice a day for diabetes  Medications Added to Medication List This Visit: 1)  Cpap 11cmh20  .... Wear at bedtime 2)  Prednisone 10 Mg Tabs (Prednisone) .... 4 tabs for 3 days, then 3 tabs for 3 days, 2 tabs for 3 days, then 1 tab for 3 days, then stop 3)  Hydromet 5-1.5 Mg/53ml Syrp (Hydrocodone-homatropine) .Marland Kitchen.. 1-2 tsp every 4-6 hr as needed cough  Complete Medication List: 1)  Advair Diskus 250-50 Mcg/dose Aepb (Fluticasone-salmeterol) .... Inhale 1 puff two times a day 2)  Loratadine 10 Mg Tabs (Loratadine) .... Take 1 tablet by mouth once a day 3)  Cozaar 50 Mg Tabs (Losartan potassium) .Marland Kitchen.. 1 tablet  by mouth daily daily 4)  Coumadin 5 Mg Tabs (Warfarin sodium) .... Take as directed 5)  Flonase 50 Mcg/act Susp (Fluticasone propionate) .... Spray 1 spray into both nostrils twice a day 6)  Nexium 40 Mg Cpdr (Esomeprazole magnesium)  .... One by mouth daily 7)  Singulair 10 Mg Tabs (Montelukast sodium) .... Take 1 tablet by mouth once a day per dr Delford Field 8)  Tikosyn 250 Mcg Caps (Dofetilide) .Marland Kitchen.. 1 by mouth two times a day 9)  Cpap 11cmh20  .... Wear at bedtime 10)  Pravastatin Sodium 20 Mg Tabs (Pravastatin sodium) .Marland Kitchen.. 1 once daily 11)  Diabetic Shoes  .... Wear regularly 12)  Glucophage 500 Mg Tabs (Metformin hcl) .Marland Kitchen.. 1 tab by mouth two times a day for diabetes 13)  Glucotrol 5 Mg Tabs (Glipizide) .... One tablet by mouth twice a day for diabetes 14)  Diltiazem Hcl Er Beads 300 Mg Xr24h-cap (Diltiazem hcl er beads) .... Once daily per dr little 15)  Ferrous Sulfate 325 (65 Fe) Mg Tabs (Ferrous sulfate) .Marland Kitchen.. 1 tab by mouth two times a day for iron deficiency 16)  Tessalon 200 Mg Caps (Benzonatate) .Marland Kitchen.. 1 by mouth three times a day as needed cough 17)  Xopenex Hfa 45 Mcg/act Aero (Levalbuterol tartrate) .... Inhale 2 puff using inhaler every four hours as needed 18)  Furosemide 40 Mg Tabs (Furosemide) .... Take one three times per week as needed per dr little 19)  Xopenex 0.63 Mg/58ml Nebu (Levalbuterol hcl) .... One in nebulizer four times daily as needed per dr Delford Field 20)  Prednisone 10 Mg Tabs (Prednisone) .... 4 tabs for 3 days, then 3 tabs for 3 days, 2 tabs for 3 days, then 1 tab for 3 days, then stop 21)  Hydromet 5-1.5 Mg/73ml Syrp (Hydrocodone-homatropine) .Marland Kitchen.. 1-2 tsp every 4-6 hr as needed cough  Patient Instructions: 1)  Prednisone taper over next week.  2)  Mucinex DM two times a day  3)  Tessalon three times a day  4)  Hydromet 1-2 tsp every 4-6 hr as  needed cough,may make you sleepy.  5)  follow up Dr. Delford Field in 3-4 weeks and as needed  6)  Please contact office for sooner follow up if symptoms do not improve or worsen  Prescriptions: HYDROMET 5-1.5 MG/5ML SYRP (HYDROCODONE-HOMATROPINE) 1-2 tsp every 4-6 hr as needed cough  #8 oz x 0   Entered and Authorized by:   Rubye Oaks NP   Signed by:   Tammy  Parrett NP on 03/03/2010   Method used:   Print then Give to Patient   RxID:   [id number] TESSALON 200 MG CAPS (BENZONATATE) 1 by mouth three times a day as needed cough  #30 x 1   Entered and Authorized by:   Rubye Oaks NP   Signed by:   Tammy Parrett NP on 03/03/2010   Method used:   Electronically to        [facility]  [address] [id number] (retail)       [address]       [place], [place]  [address]       Ph: [phone] or [phone]       Fax: [phone]   RxID:   [id number] PREDNISONE 10 MG TABS (PREDNISONE) 4 tabs for 3 days, then 3 tabs for 3 days, 2 tabs for 3 days, then 1 tab for 3 days, then stop  #30 x 0   Entered and Authorized by:   Rubye Oaks NP   Signed by:   Tammy Parrett NP on 03/03/2010   Method used:   Electronically to        [facility]  [address] [id number] (retail)       [address]       [place], [place]  [address]       Ph: [phone] or [phone]       Fax: [phone]   RxID:   [id number]

## 2010-05-13 NOTE — Assessment & Plan Note (Signed)
Summary: Pulmonary OV   Copy to:  Dr. Shan Levans Primary Provider/Referring Provider:  Cat Ta MD  CC:  Pt states she is "a lot better." Denies SOB, wheezing, chest tightness, and cough.  No complaints. .  History of Present Illness: 60  year-old, African-American  female, history of moderate persistent asthma and reflux disease.  Hx of VCD and cyclic cough.  Hx GERD  October 15, 2009 10:00 AM The pt is better vs May OV with cough.  The  dyspnea is better and is sleeping better.  No new issues.   November 19, 2009--Presents for an acute office visit. Complains of wheezing, increased SOB, dry cough, chest congestion onset last night. Symptoms started last night, worse this am. Cough is dry, no discolored mucus. Cough is aggravating. Denies chest pain,  orthopnea, hemoptysis, fever, n/v/d, edema, headache.   December 09, 2009 9:20 AM The pt had another flare up of VCD and bronchitis on  11/19/09 and saw NP.   The pt was rx with  qvar and dexilant.  The pt is now is better.  The pt is holding advair at present.   January 21, 2010 --Presents for an acute office visit. Complains of  Increased sob and wheezing since last night. Complains she had trouble sleeping due to wheezing. Has on/off again wheezing at night for 3 weeks. Has occasional  productive  cough with thick white mucus. with  chest tightness and wheezing. Used  used neb 2 hrs ago. Was seen recently for arthritis flare and leg pain tx w/ steroids. She finished Prednisone 1 week ago for leg pain. Denies chest pain, orthopnea, hemoptysis, fever, n/v/d, edema, headache.   January 28, 2010 10:01 AM Pt feels better and had RLL PNA.  Pt rx with avelox x 7days and is now better. Symptoms are improved. There is less cough and wheeze. Dyspnea is largely better.  No other issues noted CXR today is better February 05, 2010 10:34 AM  The pt started wiht more cough and awakens from sleep dyspneic.    Notes some white and green mucus.  No real sinus  congestion Notes more dyspnea.  No fever except at night.  Notes sweats at night.  Sl heartburn into back and worse with coughing. The pt was better from RML PNA two weeks ago with CXR clearance ,  now worse.  February 14, 2010 11:31 AM cough is better.  no wheeze.  no sleep issues.  No discolored mucus.   Pt denies any significant sore throat, nasal congestion or excess secretions, fever, chills, sweats, unintended weight loss, pleurtic or exertional chest pain, orthopnea PND, or leg swelling   Current Medications (verified): 1)  Advair Diskus 250-50 Mcg/dose Aepb (Fluticasone-Salmeterol) .... Inhale 1 Puff Two Times A Day 2)  Loratadine 10 Mg Tabs (Loratadine) .... Take 1 Tablet By Mouth Once A Day 3)  Cozaar 50 Mg Tabs (Losartan Potassium) .Marland Kitchen.. 1 Tablet  By Mouth Daily Daily 4)  Coumadin 5 Mg Tabs (Warfarin Sodium) .... Take As Directed 5)  Flonase 50 Mcg/act Susp (Fluticasone Propionate) .... Spray 1 Spray Into Both Nostrils Twice A Day 6)  Nexium 40 Mg Cpdr (Esomeprazole Magnesium) .... One By Mouth Daily 7)  Singulair 10 Mg Tabs (Montelukast Sodium) .... Take 1 Tablet By Mouth Once A Day Per Dr Delford Field 8)  Tikosyn 250 Mcg Caps (Dofetilide) .Marland Kitchen.. 1 By Mouth Two Times A Day 9)  Cpap 11cmh20 Qhs 10)  Pravastatin Sodium 20 Mg Tabs (Pravastatin  Sodium) .Marland Kitchen.. 1 Once Daily 11)  Diabetic Shoes .... Wear Regularly 12)  Glucophage 500 Mg Tabs (Metformin Hcl) .Marland Kitchen.. 1 Tab By Mouth Two Times A Day For Diabetes 13)  Glucotrol 5 Mg Tabs (Glipizide) .... One Tablet By Mouth Twice A Day For Diabetes 14)  Diltiazem Hcl Er Beads 300 Mg Xr24h-Cap (Diltiazem Hcl Er Beads) .... Once Daily Per Dr Clarene Duke 15)  Ferrous Sulfate 325 (65 Fe) Mg Tabs (Ferrous Sulfate) .Marland Kitchen.. 1 Tab By Mouth Two Times A Day For Iron Deficiency 16)  Tessalon 200 Mg Caps (Benzonatate) .Marland Kitchen.. 1 By Mouth Three Times A Day As Needed Cough 17)  Xopenex Hfa 45 Mcg/act Aero (Levalbuterol Tartrate) .... Inhale 2 Puff Using Inhaler Every Four Hours  As Needed 18)  Furosemide 40 Mg Tabs (Furosemide) .... Take One Three Times Per Week As Needed Per Dr Clarene Duke 19)  Xopenex 0.63 Mg/52ml  Nebu (Levalbuterol Hcl) .... One in Nebulizer Four Times Daily As Needed Per Dr Delford Field 20)  Amoxicillin-Pot Clavulanate 875-125 Mg Tabs (Amoxicillin-Pot Clavulanate) .... One By Mouth Two Times A Day  Allergies (verified): 1)  Ace Inhibitors  Past History:  Past medical, surgical, family and social histories (including risk factors) reviewed, and no changes noted (except as noted below).  Past Medical History: Reviewed history from 09/28/2009 and no changes required. UPDATED 10/30/2008 after review of old paper chart dating back to late 1990s  H. pylori negative 07/2001, Menopause at 60 y/o, on CPAP at night, sinus bradycardia with arrythmia noted on 01/21/01  #DISEASE, VOCAL CORD NEC (ICD-478.5)............DR Delford Field >mentioned in chart > Was followed by Dr. Annalee Genta ENT in late 1990s - lary showed findings c/w GERD > Was also followed by Speech Clinic at [facility]. VCD suspected there. s/p speech therap in late 1990s  ACEi Intolerant OBESITY, NOS (ICD-278.00) LEG PAIN OR KNEE PAIN (ICD-729.5) HYPERTENSION, BENIGN SYSTEMIC (ICD-401.1) HYPERCHOLESTEROLEMIA (ICD-272.0) GASTROESOPHAGEAL REFLUX, NO ESOPHAGITIS (ICD-530.81) DIABETES MELLITUS II, UNCOMPLICATED (ICD-250.00) DEPRESSION, MAJOR, RECURRENT (ICD-296.30) ATRIAL FIBRILLATION (ICD-427.31)................[facility], Dr Clarene Duke >PAF. s/p ablation >s/p pulmonary vein isolation 11/08/08 >Followed by [facility] ASTHMA, UNSPECIFIED (ICD-493.90)....................DR Delford Field >charts mention "moderare persistent" severity.  >Last PFT was late 1990s per patient hx but in review  of paper chart Dr Marchelle Gearing could not find it july 2010 >Frequent exacerbagtions needing admits late 1990s >Positive skin testing for allergies - 1998 by Dr Sidney Ace >Normal spirometry on advair/singulair - july 2010  while symptomatic  #APNEA, SLEEP (ICD-780.57)....Dr. Vassie Loll  Past Surgical History: Reviewed history from 07/11/2009 and no changes required. Colonoscopy 06/2009: Pandiverticulosis. Dr Loreta Ave s/p pulmonary vein isolation 11/08/08 at [facility] ABI = 1.99 - 03/31/2001,  Cardiac cath - 08/11/2005, Cardiac Cath- N Ef, mild CAD - 04/13/2002,  Cardiolite - scar at apical/lat wall, pos ischemia ant/anteroseptal wall - 07/20/2005,  Cholecystectomy -,  Flex sigmoid - nl - 06/02/2005,  Nissen Fundoplication 1/98 -,  R Breast Bx - sclerosing adenosis 5/93 -,  R Knee Arthroscopy - 10/12/1998, Sleep study -[facility] - 04/13/2004  Family History: Reviewed history from 06/10/2006 and no changes required. Father is deceased at 81 yo with DM and HTN., Mom died during a hysterectomy d/t a bleeding complication.  Social History: Reviewed history from 06/10/2006 and no changes required. Husband died 12/01/2022, lives w/ Daughter-Diana Hampshire. Nonsmoker, doesn't drink.  Involved in non-denominational [employer]. Not working   Review of Systems       The patient complains of shortness of breath with activity.  The patient denies shortness of breath at rest, productive  cough, non-productive cough, coughing up blood, chest pain, irregular heartbeats, acid heartburn, indigestion, loss of appetite, weight change, abdominal pain, difficulty swallowing, sore throat, tooth/dental problems, headaches, nasal congestion/difficulty breathing through nose, sneezing, itching, ear ache, anxiety, depression, hand/feet swelling, joint stiffness or pain, rash, change in color of mucus, and fever.    Vital Signs:  Patient profile:   60 year old female Height:      66 inches Weight:      213 pounds BMI:     34.50 O2 Sat:      98 % on Room air Temp:     98.4 degrees F oral Pulse rate:   74 / minute BP sitting:   110 / 80  (right arm) Cuff size:   regular  Vitals Entered By: Gweneth Dimitri RN (February 14, 2010 11:24 AM)  O2 Flow:  Room  air CC: Pt states she is "a lot better." Denies SOB, wheezing, chest tightness, cough.  No complaints.  Comments Medications reviewed with patient Daytime contact number verified with patient. Gweneth Dimitri RN  February 14, 2010 11:25 AM     Physical Exam  Additional Exam:  GEN: A/Ox3; pleasant , NAD HEENT:  [place]/AT, , EACs-clear, TMs-wnl, NOSE-clear, THROAT-clear NECK:  Supple w/ fair ROM; no JVD; normal carotid impulses w/o bruits; no thyromegaly or nodules palpated; no lymphadenopathy. RESP  Coarse BS w/ no wheeizng noted.  CARD:  RRR, no m/r/g   GI:   Soft & nt; nml bowel sounds; no organomegaly or masses detected. Musco: Warm bil,  no calf tenderness edema, clubbing, pulses intact Neuro: intact w/ no focal deficitis detected.    Impression & Recommendations:  Problem # 1:  BRONCHIAL PNEUMONIA (ICD-485) Assessment Improved bronchopneumonia improved   plan  no further abx needed No change in inhaled medications.   Maintain treatment program as currently prescribed.    Orders: Est. Patient Level III (35573)  Complete Medication List: 1)  Advair Diskus 250-50 Mcg/dose Aepb (Fluticasone-salmeterol) .... Inhale 1 puff two times a day 2)  Loratadine 10 Mg Tabs (Loratadine) .... Take 1 tablet by mouth once a day 3)  Cozaar 50 Mg Tabs (Losartan potassium) .Marland Kitchen.. 1 tablet  by mouth daily daily 4)  Coumadin 5 Mg Tabs (Warfarin sodium) .... Take as directed 5)  Flonase 50 Mcg/act Susp (Fluticasone propionate) .... Spray 1 spray into both nostrils twice a day 6)  Nexium 40 Mg Cpdr (Esomeprazole magnesium) .... One by mouth daily 7)  Singulair 10 Mg Tabs (Montelukast sodium) .... Take 1 tablet by mouth once a day per dr Delford Field 8)  Tikosyn 250 Mcg Caps (Dofetilide) .Marland Kitchen.. 1 by mouth two times a day 9)  Cpap 11cmh20 Qhs  10)  Pravastatin Sodium 20 Mg Tabs (Pravastatin sodium) .Marland Kitchen.. 1 once daily 11)  Diabetic Shoes  .... Wear regularly 12)  Glucophage 500 Mg Tabs (Metformin hcl) .Marland Kitchen.. 1  tab by mouth two times a day for diabetes 13)  Glucotrol 5 Mg Tabs (Glipizide) .... One tablet by mouth twice a day for diabetes 14)  Diltiazem Hcl Er Beads 300 Mg Xr24h-cap (Diltiazem hcl er beads) .... Once daily per dr little 15)  Ferrous Sulfate 325 (65 Fe) Mg Tabs (Ferrous sulfate) .Marland Kitchen.. 1 tab by mouth two times a day for iron deficiency 16)  Tessalon 200 Mg Caps (Benzonatate) .Marland Kitchen.. 1 by mouth three times a day as needed cough 17)  Xopenex Hfa 45 Mcg/act Aero (Levalbuterol tartrate) .... Inhale 2 puff using inhaler every four  hours as needed 18)  Furosemide 40 Mg Tabs (Furosemide) .... Take one three times per week as needed per dr little 19)  Xopenex 0.63 Mg/1ml Nebu (Levalbuterol hcl) .... One in nebulizer four times daily as needed per dr Delford Field  Patient Instructions: 1)  Keep December appt 2)  No change in medications 3)  Finish antibiotic

## 2010-05-13 NOTE — Assessment & Plan Note (Signed)
Summary: Pulmonary OV   Copy to:  Dr. Shan Levans Primary Provider/Referring Provider:  Cat Ta MD  CC:  3 month asthma follow up.  states breathing is doing well overall.  denies SOB, wheezing, and chest tightness and cough.  No complaints..  History of Present Illness: 60 year-old, African-American  female, history of moderate persistent asthma and reflux disease.  Hx of VCD and cyclic cough.  Hx GERD  March 11, 2009 10:48 AM Feels bad for one week.  More cough for one week.  Cough is dry.  More wheeze.  No fever.  Pt notes pn drip.  Sl heartburn is noted.  Burns in back of upper chest with cough.  Trying to follow the reflux diet. Was here last week 11/22 with Vassie Loll and is no better  on the promethizine/codeine cough syrup.  March 19, 2009 9:55 AM Dyspnea and cough is better.  No heartburn at all now.  Pt finished avelox.  Pt is following a reflux diet. There is no wheezing.  No F/C/S. Pt denies any significant sore throat, nasal congestion or excess secretions, fever, chills, sweats, unintended weight loss, pleurtic or exertional chest pain, orthopnea PND, or leg swelling   June 17, 2009 10:56 AM No cough now.   No wheeze,  No chest pain no refills needed on cpap  no new issues using lasix 40mg  three times a day as needed for edema, on no kcl  Asthma History    Asthma Control Assessment:    Age range: 12+ years    Symptoms: 0-2 days/week    Nighttime Awakenings: 0-2/month    Interferes w/ normal activity: no limitations    SABA use (not for EIB): 0-2 days/week    ATAQ questionnaire: 0    FEV1: 2.16 liters (today)    Exacerbations requiring oral systemic steroids: 0-1/year    Asthma Control Assessment: Well Controlled   Preventive Screening-Counseling & Management  Alcohol-Tobacco     Smoking Status: never  Current Medications (verified): 1)  Advair Diskus 250-50 Mcg/dose Misc (Fluticasone-Salmeterol) .... Inhale 1 Puff As Directed Twice A Day 2)  Allegra 180  Mg Tabs (Fexofenadine Hcl) .... Take 1 Tablet By Mouth Once A Day 3)  Cozaar 50 Mg Tabs (Losartan Potassium) .Marland Kitchen.. 1 Tablet  By Mouth Daily Daily 4)  Coumadin 5 Mg Tabs (Warfarin Sodium) .... Take As Directed 5)  Flonase 50 Mcg/act Susp (Fluticasone Propionate) .... Spray 1 Spray Into Both Nostrils Twice A Day 6)  Nexium 40 Mg Cpdr (Esomeprazole Magnesium) .... One By Mouth Daily 7)  Singulair 10 Mg Tabs (Montelukast Sodium) .... Take 1 Tablet By Mouth Once A Day Per Dr Delford Field 8)  Xopenex Hfa 45 Mcg/act Aero (Levalbuterol Tartrate) .... Inhale 2 Puff Using Inhaler Every Four Hours Per Dr Delford Field 9)  Tikosyn  Caps (Dofetilide) .... One By Mouth Two Times A Day Per Dr Sampson Goon 10)  Cpap 11cmh20 Qhs 11)  Xopenex 0.63 Mg/22ml  Nebu (Levalbuterol Hcl) .... One in Nebulizer Four Times Daily As Needed Per Dr Delford Field 12)  Simvastatin 20 Mg Tabs (Simvastatin) .Marland Kitchen.. 1 By Mouth Daily 13)  Diabetic Shoes .... Wear Regularly 14)  Glucophage 1000 Mg Tabs (Metformin Hcl) .... One-Half  Tablet By Mouth in Am and Full Tablet By Mouth in Pm 15)  Glucotrol 5 Mg Tabs (Glipizide) .... One Tablet By Mouth Twice A Day For Diabetes 16)  Lasix 40 Mg .Marland Kitchen.. 1 Tab 3 Times Per Week Dr Clarene Duke 17)  Diltiazem Hcl  Er Beads 300 Mg Xr24h-Cap (Diltiazem Hcl Er Beads) .... Once Daily 18)  Hydrochlorothiazide 25 Mg Tabs (Hydrochlorothiazide) .... 1/2 Tab By Mouth Daily For Blood Pressure  Allergies (verified): 1)  Ace Inhibitors  Past History:  Past medical, surgical, family and social histories (including risk factors) reviewed, and no changes noted (except as noted below).  Past Medical History: Reviewed history from 04/08/2009 and no changes required. UPDATED 10/30/2008 after review of old paper chart dating back to late 1990s  H. pylori negative 07/2001, Menopause at 60 y/o, on CPAP at night, sinus bradycardia with arrythmia noted on 01/21/01  #DISEASE, VOCAL CORD NEC (ICD-478.5)............DR Delford Field >mentioned in  chart > Was followed by Dr. Annalee Genta ENT in late 1990s - lary showed findings c/w GERD > Was also followed by Speech Clinic at [facility]. VCD suspected there. s/p speech therap in late 1990s  ACEi Intolerant OBESITY, NOS (ICD-278.00) LEG PAIN OR KNEE PAIN (ICD-729.5) HYPERTENSION, BENIGN SYSTEMIC (ICD-401.1) HYPERCHOLESTEROLEMIA (ICD-272.0) GASTROESOPHAGEAL REFLUX, NO ESOPHAGITIS (ICD-530.81) DIABETES MELLITUS II, UNCOMPLICATED (ICD-250.00) DEPRESSION, MAJOR, RECURRENT (ICD-296.30) ATRIAL FIBRILLATION (ICD-427.31)................[facility] Dr Norton Blizzard >PAF. s/p ablation >s/p pulmonary vein isolation 11/08/08 ASTHMA, UNSPECIFIED (ICD-493.90)....................DR Delford Field >charts mention "moderare persistent" severity.  >Last PFT was late 1990s per patient hx but in review  of paper chart Dr Marchelle Gearing could not find it july 2010 >Frequent exacerbagtions needing admits late 1990s >Positive skin testing for allergies - 1998 by Dr Sidney Ace >Normal spirometry on advair/singulair - july 2010 while symptomatic  #APNEA, SLEEP (ICD-780.57)....Dr. Vassie Loll  Past Surgical History: Reviewed history from 02/16/2009 and no changes required. s/p pulmonary vein isolation 11/08/08 at [facility] ABI = 1.99 - 03/31/2001,  Cardiac cath - 08/11/2005, Cardiac Cath- N Ef, mild CAD - 04/13/2002,  Cardiolite - scar at apical/lat wall, pos ischemia ant/anteroseptal wall - 07/20/2005,  Cholecystectomy -,  Flex sigmoid - nl - 06/02/2005,  Nissen Fundoplication 1/98 -,  R Breast Bx - sclerosing adenosis 5/93 -,  R Knee Arthroscopy - 10/12/1998, Sleep study -[facility] - 04/13/2004  Family History: Reviewed history from 06/10/2006 and no changes required. Father is deceased at 45 yo with DM and HTN., Mom died during a hysterectomy d/t a bleeding complication.  Social History: Reviewed history from 06/10/2006 and no changes required. Husband died 11-28-22, lives w/ Daughter-Diana Gervais. Nonsmoker, doesn't drink.  Involved in  non-denominational [employer]. Not working  Review of Systems  The patient denies shortness of breath with activity, shortness of breath at rest, productive cough, non-productive cough, coughing up blood, chest pain, irregular heartbeats, acid heartburn, indigestion, loss of appetite, weight change, abdominal pain, difficulty swallowing, sore throat, tooth/dental problems, headaches, nasal congestion/difficulty breathing through nose, sneezing, itching, ear ache, anxiety, depression, hand/feet swelling, joint stiffness or pain, rash, change in color of mucus, and fever.    Vital Signs:  Patient profile:   60 year old female Height:      67 inches Weight:      218.50 pounds BMI:     34.35 O2 Sat:      99 % on Room air Temp:     98.0 degrees F oral Pulse rate:   71 / minute BP sitting:   122 / 78  (left arm) Cuff size:   large  Vitals Entered By: Gweneth Dimitri RN (June 17, 2009 10:43 AM)  O2 Flow:  Room air CC: 3 month asthma follow up.  states breathing is doing well overall.  denies SOB, wheezing, chest tightness and cough.  No complaints. Comments Medications reviewed with  patient Daytime contact number verified with patient. Gweneth Dimitri RN  June 17, 2009 10:43 AM    Physical Exam  Additional Exam:  Gen: Pleasant, well-nourished, in no distress , normal affect ENT: no lesions, no post nasal drip Neck: No JVD, no TMG, no carotid bruits Lungs: No use of accessory muscles, no dullness to percussion, improved  pseudowheeze Cardiovascular: RRR, heart sounds normal, no murmurs or gallops, no peripheral edema Musculoskeletal: No deformities, no cyanosis or clubbing      Pre-Spirometry FEV1    Value: 2.16 L     Impression & Recommendations:  Problem # 1:  DISEASE, VOCAL CORD NEC (ICD-478.5) Assessment Improved stable VCD Orders: Est. Patient Level III (16109)  Problem # 2:  ASTHMA, UNSPECIFIED (ICD-493.90) Assessment: Improved  Stable moderate persistent  asthma with ppt factors in the past to include GERD and acute bronchitis  plan follow reflux diet cont advair no other changes  Medications Added to Medication List This Visit: 1)  Furosemide 40 Mg Tabs (Furosemide) .... Take one three times a day as needed  Complete Medication List: 1)  Advair Diskus 250-50 Mcg/dose Misc (Fluticasone-salmeterol) .... Inhale 1 puff as directed twice a day 2)  Allegra 180 Mg Tabs (Fexofenadine hcl) .... Take 1 tablet by mouth once a day 3)  Cozaar 50 Mg Tabs (Losartan potassium) .Marland Kitchen.. 1 tablet  by mouth daily daily 4)  Coumadin 5 Mg Tabs (Warfarin sodium) .... Take as directed 5)  Flonase 50 Mcg/act Susp (Fluticasone propionate) .... Spray 1 spray into both nostrils twice a day 6)  Nexium 40 Mg Cpdr (Esomeprazole magnesium) .... One by mouth daily 7)  Singulair 10 Mg Tabs (Montelukast sodium) .... Take 1 tablet by mouth once a day per dr Delford Field 8)  Xopenex Hfa 45 Mcg/act Aero (Levalbuterol tartrate) .... Inhale 2 puff using inhaler every four hours per dr Delford Field 9)  Tikosyn Caps (dofetilide)  .... One by mouth two times a day per dr fitzgerald 10)  Cpap 11cmh20 Qhs  11)  Xopenex 0.63 Mg/80ml Nebu (Levalbuterol hcl) .... One in nebulizer four times daily as needed per dr Delford Field 12)  Simvastatin 20 Mg Tabs (Simvastatin) .Marland Kitchen.. 1 by mouth daily 13)  Diabetic Shoes  .... Wear regularly 14)  Glucophage 1000 Mg Tabs (Metformin hcl) .... One-half  tablet by mouth in am and full tablet by mouth in pm 15)  Glucotrol 5 Mg Tabs (Glipizide) .... One tablet by mouth twice a day for diabetes 16)  Lasix 40 Mg  .Marland Kitchen.. 1 tab 3 times per week dr little 17)  Diltiazem Hcl Er Beads 300 Mg Xr24h-cap (Diltiazem hcl er beads) .... Once daily 18)  Hydrochlorothiazide 25 Mg Tabs (Hydrochlorothiazide) .... 1/2 tab by mouth daily for blood pressure 19)  Furosemide 40 Mg Tabs (Furosemide) .... Take one three times a day as needed  Patient Instructions: 1)  No change in  medications 2)  Return 4 months

## 2010-05-13 NOTE — Assessment & Plan Note (Signed)
Summary: Pulmonary OV   Copy to:  Dr. Shan Levans Primary Provider/Referring Provider:  Cat Ta MD  CC:  Pt here for follow up. Pt c/o "whistle sound" in chest at night and and lower left leg edema.  Pt denies coughing and chest tightness.  History of Present Illness: 60  year-old, African-American  female, history of moderate persistent asthma and reflux disease.  Hx of VCD and cyclic cough.  Hx GERD   October 15, 2009 10:00 AM The pt is better vs May OV with cough.  The  dyspnea is better and is sleeping better.  No new issues. Pt denies any significant sore throat, nasal congestion or excess secretions, fever, chills, sweats, unintended weight loss, pleurtic or exertional chest pain, orthopnea PND, or leg swelling Pt denies any increase in rescue therapy over baseline, denies waking up needing it or having any early am or nocturnal exacerbations of coughing/wheezing/or dyspnea.   Asthma History    Asthma Control Assessment:    Age range: 12+ years    Symptoms: 0-2 days/week    Nighttime Awakenings: 0-2/month    Interferes w/ normal activity: no limitations    SABA use (not for EIB): 0-2 days/week    ATAQ questionnaire: 0    FEV1: 2.16 liters (today)    Exacerbations requiring oral systemic steroids: 0-1/year    Asthma Control Assessment: Well Controlled   Preventive Screening-Counseling & Management  Alcohol-Tobacco     Alcohol drinks/day: 0     Smoking Status: never     Year Quit: 1980     Pack years: 10  Current Medications (verified): 1)  Advair Diskus 250-50 Mcg/dose Aepb (Fluticasone-Salmeterol) .... Inhale 1 Puff Two Times A Day 2)  Allegra 180 Mg Tabs (Fexofenadine Hcl) .... Take 1 Tablet By Mouth Once A Day 3)  Cozaar 50 Mg Tabs (Losartan Potassium) .Marland Kitchen.. 1 Tablet  By Mouth Daily Daily 4)  Coumadin 5 Mg Tabs (Warfarin Sodium) .... Take As Directed 5)  Flonase 50 Mcg/act Susp (Fluticasone Propionate) .... Spray 1 Spray Into Both Nostrils Twice A Day 6)  Nexium 40  Mg Cpdr (Esomeprazole Magnesium) .... One By Mouth Daily 7)  Singulair 10 Mg Tabs (Montelukast Sodium) .... Take 1 Tablet By Mouth Once A Day Per Dr Delford Field 8)  Xopenex Hfa 45 Mcg/act Aero (Levalbuterol Tartrate) .... Inhale 2 Puff Using Inhaler Every Four Hours Per Dr Delford Field 9)  Tikosyn  Caps (Dofetilide) .... One By Mouth Two Times A Day Per Dr Sampson Goon 10)  Cpap 11cmh20 Qhs 11)  Xopenex 0.63 Mg/69ml  Nebu (Levalbuterol Hcl) .... One in Nebulizer Four Times Daily As Needed Per Dr Delford Field 12)  Pravastatin Sodium 20 Mg Tabs (Pravastatin Sodium) .Marland Kitchen.. 1 Once Daily 13)  Diabetic Shoes .... Wear Regularly 14)  Glucophage 1000 Mg Tabs (Metformin Hcl) .... One-Half  Tablet By Mouth in Am and Full Tablet By Mouth in Pm 15)  Glucotrol 5 Mg Tabs (Glipizide) .... One Tablet By Mouth Twice A Day For Diabetes 16)  Diltiazem Hcl Er Beads 300 Mg Xr24h-Cap (Diltiazem Hcl Er Beads) .... Once Daily 17)  Hydrochlorothiazide 25 Mg Tabs (Hydrochlorothiazide) .... 1/2 Tab By Mouth Daily For Blood Pressure 18)  Furosemide 40 Mg Tabs (Furosemide) .... Take One Three Times A Day As Needed  Allergies (verified): 1)  Ace Inhibitors  Past History:  Past medical, surgical, family and social histories (including risk factors) reviewed, and no changes noted (except as noted below).  Past Medical History: Reviewed history from  09/28/2009 and no changes required. UPDATED 10/30/2008 after review of old paper chart dating back to late 1990s  H. pylori negative 07/2001, Menopause at 60 y/o, on CPAP at night, sinus bradycardia with arrythmia noted on 01/21/01  #DISEASE, VOCAL CORD NEC (ICD-478.5)............DR Delford Field >mentioned in chart > Was followed by Dr. Annalee Genta ENT in late 1990s - lary showed findings c/w GERD > Was also followed by Speech Clinic at [facility]. VCD suspected there. s/p speech therap in late 1990s  ACEi Intolerant OBESITY, NOS (ICD-278.00) LEG PAIN OR KNEE PAIN (ICD-729.5) HYPERTENSION, BENIGN  SYSTEMIC (ICD-401.1) HYPERCHOLESTEROLEMIA (ICD-272.0) GASTROESOPHAGEAL REFLUX, NO ESOPHAGITIS (ICD-530.81) DIABETES MELLITUS II, UNCOMPLICATED (ICD-250.00) DEPRESSION, MAJOR, RECURRENT (ICD-296.30) ATRIAL FIBRILLATION (ICD-427.31)................[facility], Dr Clarene Duke >PAF. s/p ablation >s/p pulmonary vein isolation 11/08/08 >Followed by [facility] ASTHMA, UNSPECIFIED (ICD-493.90)....................DR Delford Field >charts mention "moderare persistent" severity.  >Last PFT was late 1990s per patient hx but in review  of paper chart Dr Marchelle Gearing could not find it july 2010 >Frequent exacerbagtions needing admits late 1990s >Positive skin testing for allergies - 1998 by Dr Sidney Ace >Normal spirometry on advair/singulair - july 2010 while symptomatic  #APNEA, SLEEP (ICD-780.57)....Dr. Vassie Loll  Past Surgical History: Reviewed history from 07/11/2009 and no changes required. Colonoscopy 06/2009: Pandiverticulosis. Dr Loreta Ave s/p pulmonary vein isolation 11/08/08 at [facility] ABI = 1.99 - 03/31/2001,  Cardiac cath - 08/11/2005, Cardiac Cath- N Ef, mild CAD - 04/13/2002,  Cardiolite - scar at apical/lat wall, pos ischemia ant/anteroseptal wall - 07/20/2005,  Cholecystectomy -,  Flex sigmoid - nl - 06/02/2005,  Nissen Fundoplication 1/98 -,  R Breast Bx - sclerosing adenosis 5/93 -,  R Knee Arthroscopy - 10/12/1998, Sleep study -[facility] - 04/13/2004  Family History: Reviewed history from 06/10/2006 and no changes required. Father is deceased at 72 yo with DM and HTN., Mom died during a hysterectomy d/t a bleeding complication.  Social History: Reviewed history from 06/10/2006 and no changes required. Husband died 11-19-2022, lives w/ Daughter-Diana Glennie. Nonsmoker, doesn't drink.  Involved in non-denominational [employer]. Not working  Review of Systems  The patient denies shortness of breath with activity, shortness of breath at rest, productive cough, non-productive cough, coughing up blood, chest  pain, irregular heartbeats, acid heartburn, indigestion, loss of appetite, weight change, abdominal pain, difficulty swallowing, sore throat, tooth/dental problems, headaches, nasal congestion/difficulty breathing through nose, sneezing, itching, ear ache, anxiety, depression, hand/feet swelling, joint stiffness or pain, rash, change in color of mucus, and fever.    Vital Signs:  Patient profile:   60 year old female Height:      67 inches Weight:      216.5 pounds BMI:     34.03 O2 Sat:      99 % on Room air Temp:     98.1 degrees F oral Pulse rate:   50 / minute BP sitting:   98 / 70  (left arm) Cuff size:   large  Vitals Entered By: Zackery Barefoot CMA (October 15, 2009 9:44 AM)  O2 Flow:  Room air CC: Pt here for follow up. Pt c/o "whistle sound" in chest at night, and lower left leg edema.  Pt denies coughing and chest tightness Comments Medications reviewed with patient Verified contact number and pharmacy with patient Zackery Barefoot CMA  October 15, 2009 9:45 AM    Physical Exam  Additional Exam:  Gen: Pleasant, well-nourished, in no distress , normal affect ENT: mild nasal purulence Neck: No JVD, no TMG, no carotid bruits Lungs: No use of accessory  muscles, no dullness to percussion, improved  pseudowheeze Cardiovascular: RRR, heart sounds normal, no murmurs or gallops, no peripheral edema Musculoskeletal: No deformities, no cyanosis or clubbing      Pre-Spirometry FEV1    Value: 2.16 L     Impression & Recommendations:  Problem # 1:  ASTHMA, UNSPECIFIED (ICD-493.90) Assessment Improved  Improved  moderate persistent asthma with ppt factors in the past to include GERD and   acute bronchitis with associated vocal cord dysfunction and mild early sinusitis, all improved.  plan ok to resume Advair no other changes offered  Medications Added to Medication List This Visit: 1)  Advair Diskus 250-50 Mcg/dose Aepb (Fluticasone-salmeterol) .... Inhale 1 puff two times a  day 2)  Xopenex Hfa 45 Mcg/act Aero (Levalbuterol tartrate) .... Inhale 2 puff using inhaler every four hours as needed  Complete Medication List: 1)  Advair Diskus 250-50 Mcg/dose Aepb (Fluticasone-salmeterol) .... Inhale 1 puff two times a day 2)  Allegra 180 Mg Tabs (Fexofenadine hcl) .... Take 1 tablet by mouth once a day 3)  Cozaar 50 Mg Tabs (Losartan potassium) .Marland Kitchen.. 1 tablet  by mouth daily daily 4)  Coumadin 5 Mg Tabs (Warfarin sodium) .... Take as directed 5)  Flonase 50 Mcg/act Susp (Fluticasone propionate) .... Spray 1 spray into both nostrils twice a day 6)  Nexium 40 Mg Cpdr (Esomeprazole magnesium) .... One by mouth daily 7)  Singulair 10 Mg Tabs (Montelukast sodium) .... Take 1 tablet by mouth once a day per dr Delford Field 8)  Xopenex Hfa 45 Mcg/act Aero (Levalbuterol tartrate) .... Inhale 2 puff using inhaler every four hours as needed 9)  Tikosyn Caps (dofetilide)  .... One by mouth two times a day per dr fitzgerald 10)  Cpap 11cmh20 Qhs  11)  Xopenex 0.63 Mg/40ml Nebu (Levalbuterol hcl) .... One in nebulizer four times daily as needed per dr Delford Field 12)  Pravastatin Sodium 20 Mg Tabs (Pravastatin sodium) .Marland Kitchen.. 1 once daily 13)  Diabetic Shoes  .... Wear regularly 14)  Glucophage 1000 Mg Tabs (Metformin hcl) .... One-half  tablet by mouth in am and full tablet by mouth in pm 15)  Glucotrol 5 Mg Tabs (Glipizide) .... One tablet by mouth twice a day for diabetes 16)  Diltiazem Hcl Er Beads 300 Mg Xr24h-cap (Diltiazem hcl er beads) .... Once daily 17)  Hydrochlorothiazide 25 Mg Tabs (Hydrochlorothiazide) .... 1/2 tab by mouth daily for blood pressure 18)  Furosemide 40 Mg Tabs (Furosemide) .... Take one three times a day as needed  Other Orders: Est. Patient Level III (59563)  Patient Instructions: 1)  No change in medications 2)  Return in     4     months

## 2010-05-13 NOTE — Progress Notes (Signed)
Summary: meds question   Phone Note Call from Patient Call back at Home Phone [phone]   Caller: Patient Summary of Call: is concerned about taking Nortriptyline for her leg pain - thinks it is for depression - she doesn't have depression. Initial call taken by: De Nurse,  May 15, 2009 3:31 PM  Follow-up for Phone Call        tried to explain that it is good for sleep. pain & depression. states she does ot want to take it. asked that pcp call her Follow-up by: Golden Circle RN,  May 15, 2009 3:32 PM    Called pt back.  As I explained during OV I am prescribing the Nortriptyline for leg pain.  Left message for pt with this information.  Korde Jeppsen MD  May 15, 2009 5:20 PM

## 2010-05-13 NOTE — Assessment & Plan Note (Signed)
Summary: Pulmonary OV   Copy to:  Dr. Shan Levans Primary Provider/Referring Provider:  Angeline Slim MD  CC:  Acute Visit.  wheezing and cough - prod with small amount of white and green mucus x 2 days.  Also having SOB when laying down at night and rattling in chest at night x 4-5 days.  Denies fever.Marland Kitchen  History of Present Illness: 60  year-old, African-American  female, history of moderate persistent asthma and reflux disease.  Hx of VCD and cyclic cough.  Hx GERD  October 15, 2009 10:00 AM The pt is better vs May OV with cough.  The  dyspnea is better and is sleeping better.  No new issues.   November 19, 2009--Presents for an acute office visit. Complains of wheezing, increased SOB, dry cough, chest congestion onset last night. Symptoms started last night, worse this am. Cough is dry, no discolored mucus. Cough is aggravating. Denies chest pain,  orthopnea, hemoptysis, fever, n/v/d, edema, headache.   December 09, 2009 9:20 AM The pt had another flare up of VCD and bronchitis on  11/19/09 and saw NP.   The pt was rx with  qvar and dexilant.  The pt is now is better.  The pt is holding advair at present.   January 21, 2010 --Presents for an acute office visit. Complains of  Increased sob and wheezing since last night. Complains she had trouble sleeping due to wheezing. Has on/off again wheezing at night for 3 weeks. Has occasional  productive  cough with thick white mucus. with  chest tightness and wheezing. Used  used neb 2 hrs ago. Was seen recently for arthritis flare and leg pain tx w/ steroids. She finished Prednisone 1 week ago for leg pain. Denies chest pain, orthopnea, hemoptysis, fever, n/v/d, edema, headache.   January 28, 2010 10:01 AM Pt feels better and had RLL PNA.  Pt rx with avelox x 7days and is now better. Symptoms are improved. There is less cough and wheeze. Dyspnea is largely better.  No other issues noted CXR today is better February 05, 2010 10:34 AM  The pt started wiht more  cough and awakens from sleep dyspneic.    Notes some white and green mucus.  No real sinus congestion Notes more dyspnea.  No fever except at night.  Notes sweats at night.  Sl heartburn into back and worse with coughing. The pt was better from RML PNA two weeks ago with CXR clearance ,  now worse.   Preventive Screening-Counseling & Management  Alcohol-Tobacco     Alcohol drinks/day: 0     Smoking Status: never     Year Quit: 1980     Pack years: 10     Passive Smoke Exposure: no  Current Medications (verified): 1)  Advair Diskus 250-50 Mcg/dose Aepb (Fluticasone-Salmeterol) .... Inhale 1 Puff Two Times A Day 2)  Loratadine 10 Mg Tabs (Loratadine) .... Take 1 Tablet By Mouth Once A Day 3)  Cozaar 50 Mg Tabs (Losartan Potassium) .Marland Kitchen.. 1 Tablet  By Mouth Daily Daily 4)  Coumadin 5 Mg Tabs (Warfarin Sodium) .... Take As Directed 5)  Flonase 50 Mcg/act Susp (Fluticasone Propionate) .... Spray 1 Spray Into Both Nostrils Twice A Day 6)  Nexium 40 Mg Cpdr (Esomeprazole Magnesium) .... One By Mouth Daily 7)  Singulair 10 Mg Tabs (Montelukast Sodium) .... Take 1 Tablet By Mouth Once A Day Per Dr Delford Field 8)  Tikosyn 250 Mcg Caps (Dofetilide) .Marland Kitchen.. 1 By Mouth Two Times  A Day 9)  Cpap 11cmh20 Qhs 10)  Pravastatin Sodium 20 Mg Tabs (Pravastatin Sodium) .Marland Kitchen.. 1 Once Daily 11)  Diabetic Shoes .... Wear Regularly 12)  Glucophage 500 Mg Tabs (Metformin Hcl) .Marland Kitchen.. 1 Tab By Mouth Two Times A Day For Diabetes 13)  Glucotrol 5 Mg Tabs (Glipizide) .... One Tablet By Mouth Twice A Day For Diabetes 14)  Diltiazem Hcl Er Beads 300 Mg Xr24h-Cap (Diltiazem Hcl Er Beads) .... Once Daily Per Dr Clarene Duke 15)  Ferrous Sulfate 325 (65 Fe) Mg Tabs (Ferrous Sulfate) .Marland Kitchen.. 1 Tab By Mouth Two Times A Day For Iron Deficiency 16)  Tessalon 200 Mg Caps (Benzonatate) .Marland Kitchen.. 1 By Mouth Three Times A Day As Needed Cough 17)  Xopenex Hfa 45 Mcg/act Aero (Levalbuterol Tartrate) .... Inhale 2 Puff Using Inhaler Every Four Hours As  Needed 18)  Furosemide 40 Mg Tabs (Furosemide) .... Take One Three Times Per Week As Needed Per Dr Clarene Duke 19)  Xopenex 0.63 Mg/17ml  Nebu (Levalbuterol Hcl) .... One in Nebulizer Four Times Daily As Needed Per Dr Delford Field  Allergies (verified): 1)  Ace Inhibitors  Past History:  Past medical, surgical, family and social histories (including risk factors) reviewed, and no changes noted (except as noted below).  Past Medical History: Reviewed history from 09/28/2009 and no changes required. UPDATED 10/30/2008 after review of old paper chart dating back to late 1990s  H. pylori negative 07/2001, Menopause at 60 y/o, on CPAP at night, sinus bradycardia with arrythmia noted on 01/21/01  #DISEASE, VOCAL CORD NEC (ICD-478.5)............DR Delford Field >mentioned in chart > Was followed by Dr. Annalee Genta ENT in late 1990s - lary showed findings c/w GERD > Was also followed by Speech Clinic at [facility]. VCD suspected there. s/p speech therap in late 1990s  ACEi Intolerant OBESITY, NOS (ICD-278.00) LEG PAIN OR KNEE PAIN (ICD-729.5) HYPERTENSION, BENIGN SYSTEMIC (ICD-401.1) HYPERCHOLESTEROLEMIA (ICD-272.0) GASTROESOPHAGEAL REFLUX, NO ESOPHAGITIS (ICD-530.81) DIABETES MELLITUS II, UNCOMPLICATED (ICD-250.00) DEPRESSION, MAJOR, RECURRENT (ICD-296.30) ATRIAL FIBRILLATION (ICD-427.31)................[facility], Dr Clarene Duke >PAF. s/p ablation >s/p pulmonary vein isolation 11/08/08 >Followed by [facility] ASTHMA, UNSPECIFIED (ICD-493.90)....................DR Delford Field >charts mention "moderare persistent" severity.  >Last PFT was late 1990s per patient hx but in review  of paper chart Dr Marchelle Gearing could not find it july 2010 >Frequent exacerbagtions needing admits late 1990s >Positive skin testing for allergies - 1998 by Dr Sidney Ace >Normal spirometry on advair/singulair - july 2010 while symptomatic  #APNEA, SLEEP (ICD-780.57)....Dr. Vassie Loll  Past Surgical History: Reviewed history from  07/11/2009 and no changes required. Colonoscopy 06/2009: Pandiverticulosis. Dr Loreta Ave s/p pulmonary vein isolation [facility]/08/[facility] ABI = 1.99 - 03/31/2001,  Cardiac cath - 08/11/2005, Cardiac Cath- N Ef, mild CAD - 04/13/2002,  Cardiolite - scar at apical/lat wall, pos ischemia ant/anteroseptal wall - 07/20/2005,  Cholecystectomy -,  Flex sigmoid - nl - 06/02/2005,  Nissen Fundoplication 1/98 -,  R Breast Bx - sclerosing adenosis 5/93 -,  R Knee Arthroscopy - 10/12/1998, Sleep study -[facility] - 04/13/2004  Family History: Reviewed history from 06/10/2006 and no changes required. Father is deceased at 47 yo with DM and HTN., Mom died during a hysterectomy d/t a bleeding complication.  Social History: Reviewed history from 06/10/2006 and no changes required. Husband died Dec 06, 2022, lives w/ Daughter-Diana Mowers. Nonsmoker, doesn't drink.  Involved in non-denominational [employer]. Not working  Review of Systems       The patient complains of shortness of breath with activity, shortness of breath at rest, productive cough, non-productive cough, chest pain, acid heartburn,  indigestion, change in color of mucus, and fever.  The patient denies coughing up blood, irregular heartbeats, loss of appetite, weight change, abdominal pain, difficulty swallowing, sore throat, tooth/dental problems, headaches, nasal congestion/difficulty breathing through nose, sneezing, itching, ear ache, anxiety, depression, hand/feet swelling, joint stiffness or pain, and rash.    Vital Signs:  Patient profile:   60 year old female Height:      66 inches Weight:      213.13 pounds BMI:     34.52 O2 Sat:      98 % on Room air Temp:     98.3 degrees F oral Pulse rate:   85 / minute BP sitting:   100 / 72  (left arm) Cuff size:   regular  Vitals Entered By: Gweneth Dimitri RN (February 05, 2010 10:20 AM)  O2 Flow:  Room air CC: Acute Visit.  wheezing, cough - prod with small amount of white and green mucus x 2 days.  Also having  SOB when laying down at night and rattling in chest at night x 4-5 days.  Denies fever. Comments Medications reviewed with patient Daytime contact number verified with patient. Gweneth Dimitri RN  February 05, 2010 10:20 AM    Physical Exam  Additional Exam:  GEN: A/Ox3; pleasant , NAD HEENT:  /AT, , EACs-clear, TMs-wnl, NOSE-clear, THROAT-clear NECK:  Supple w/ fair ROM; no JVD; normal carotid impulses w/o bruits; no thyromegaly or nodules palpated; no lymphadenopathy. RESP  Coarse BS w/ no wheeizng noted.  CARD:  RRR, no m/r/g   GI:   Soft & nt; nml bowel sounds; no organomegaly or masses detected. Musco: Warm bil,  no calf tenderness edema, clubbing, pulses intact Neuro: intact w/ no focal deficitis detected.    Impression & Recommendations:  Problem # 1:  BRONCHIAL PNEUMONIA (ICD-485) Assessment Deteriorated probable recurrent pna RLL/RML, gerd and vcd major issues here  plan trial augmentin x 10days rocephin 1gm IM return shortterm may yet need admit Her updated medication list for this problem includes:    Amoxicillin-pot Clavulanate 875-125 Mg Tabs (Amoxicillin-pot clavulanate) ..... One by mouth two times a day  Orders: Est. Patient Level V (93235) Rocephin  250mg  (T7322) Admin of Therapeutic Inj  intramuscular or subcutaneous (02542)  Problem # 2:  GASTROESOPHAGEAL REFLUX, NO ESOPHAGITIS (ICD-530.81) Assessment: Deteriorated severe gerd plan change nexium to dexilant x one sample box then resume nexium Her updated medication list for this problem includes:    Nexium 40 Mg Cpdr (Esomeprazole magnesium) ..... One by mouth daily  Medications Added to Medication List This Visit: 1)  Amoxicillin-pot Clavulanate 875-125 Mg Tabs (Amoxicillin-pot clavulanate) .... One by mouth two times a day  Complete Medication List: 1)  Advair Diskus 250-50 Mcg/dose Aepb (Fluticasone-salmeterol) .... Inhale 1 puff two times a day 2)  Loratadine 10 Mg Tabs (Loratadine) .... Take 1  tablet by mouth once a day 3)  Cozaar 50 Mg Tabs (Losartan potassium) .Marland Kitchen.. 1 tablet  by mouth daily daily 4)  Coumadin 5 Mg Tabs (Warfarin sodium) .... Take as directed 5)  Flonase 50 Mcg/act Susp (Fluticasone propionate) .... Spray 1 spray into both nostrils twice a day 6)  Nexium 40 Mg Cpdr (Esomeprazole magnesium) .... One by mouth daily 7)  Singulair 10 Mg Tabs (Montelukast sodium) .... Take 1 tablet by mouth once a day per dr Delford Field 8)  Tikosyn 250 Mcg Caps (Dofetilide) .Marland Kitchen.. 1 by mouth two times a day 9)  Cpap 11cmh20 Qhs  10)  Pravastatin Sodium 20  Mg Tabs (Pravastatin sodium) .Marland Kitchen.. 1 once daily 11)  Diabetic Shoes  .... Wear regularly 12)  Glucophage 500 Mg Tabs (Metformin hcl) .Marland Kitchen.. 1 tab by mouth two times a day for diabetes 13)  Glucotrol 5 Mg Tabs (Glipizide) .... One tablet by mouth twice a day for diabetes 14)  Diltiazem Hcl Er Beads 300 Mg Xr24h-cap (Diltiazem hcl er beads) .... Once daily per dr little 15)  Ferrous Sulfate 325 (65 Fe) Mg Tabs (Ferrous sulfate) .Marland Kitchen.. 1 tab by mouth two times a day for iron deficiency 16)  Tessalon 200 Mg Caps (Benzonatate) .Marland Kitchen.. 1 by mouth three times a day as needed cough 17)  Xopenex Hfa 45 Mcg/act Aero (Levalbuterol tartrate) .... Inhale 2 puff using inhaler every four hours as needed 18)  Furosemide 40 Mg Tabs (Furosemide) .... Take one three times per week as needed per dr little 19)  Xopenex 0.63 Mg/57ml Nebu (Levalbuterol hcl) .... One in nebulizer four times daily as needed per dr Delford Field 20)  Amoxicillin-pot Clavulanate 875-125 Mg Tabs (Amoxicillin-pot clavulanate) .... One by mouth two times a day  Patient Instructions: 1)  augmentin (generic) one twice daily for 10days 2)  A rocephin injection 1 gram will be given 3)  USe Dexilant one daily until gone then resume nexium (hold nexium while on dexilant) 4)  No other medication changes 5)  Return 10days Prescriptions: AMOXICILLIN-POT CLAVULANATE 875-125 MG TABS (AMOXICILLIN-POT CLAVULANATE)  one by mouth two times a day  #20 x 0   Entered and Authorized by:   Storm Frisk MD   Signed by:   Storm Frisk MD on 02/05/2010   Method used:   Electronically to        [facility]  [address] [id number] (retail)       [address]       [place], [place]  [address]       Ph: [phone] or [phone]       Fax: [phone]   RxID:   [id number]      Medication Administration  Injection # 1:    Medication: Rocephin  250mg     Diagnosis: BRONCHIAL PNEUMONIA (ICD-485)    Route: IM    Site: LUOQ gluteus    Exp Date: 08/2011    Lot #: [id number]    Mfr: [email or web address]    Comments: 1 GRAM ROCEPHIN GIVEN    Patient tolerated injection without complications    Given by: Gweneth Dimitri RN (February 05, 2010 11:00 AM)  Orders Added: 1)  Est. Patient Level V [75102] 2)  Rocephin  250mg  [J0696] 3)  Admin of Therapeutic Inj  intramuscular or subcutaneous [58527]

## 2010-05-13 NOTE — Assessment & Plan Note (Signed)
Summary: f/u,df   Vital Signs:  Patient profile:   60 year old female Weight:      213.8 pounds BMI:     33.61 Pulse rate:   70 / minute BP sitting:   120 / 80  (right arm)  Vitals Entered By: Arlyss Repress CMA, (September 23, 2009 9:00 AM) CC: f/up DM Is Patient Diabetic? Yes Pain Assessment Patient in pain? no        Primary Care Provider:  Nayah Lukens MD  CC:  f/up DM.  History of Present Illness: 60 y/o F here for f/u of   DIABETES Meds: metformin 500mg  in AM, 1000mg  in PM, Glucotrol 5mg  bid Compliance: yes Diet: eating more fruits, trying to walking "a little bit".  walking 2/wk.  Carrying cheerios for snacks Lightheadedness: no    Dizziness: no    Confusion:no    Shakiness: no    Abd  pain: no   Nausea:no    Vomiting:no     Eye exam: appt next Monday at 9AM   HYPERTENSION Disease Monitoring Blood pressure range: 120s Medications: cozaar, diltiazem, lasix, hctz Compliance: yes  Lightheadedness: no  Edema:no  hest pain: no  Dyspnea:no Prevention Exercise: trying, walkingSalt restriction:yes   OBESITY BMI: 33.6 Weight difference since last OV: -4lbs Exercise: walking 2/wk             Diet: eating more fruits, less fatty foods    Habits & Providers  Alcohol-Tobacco-Diet     Tobacco Status: never  Exercise-Depression-Behavior     Does Patient Exercise: yes     Exercise Counseling: to improve exercise regimen     Type of exercise: walking     Exercise (avg: min/session): <30     Times/week: <3     Podiatrist: Alesana Magistro, MD  Allergies: 1)  Ace Inhibitors  Past History:  Past Medical History: Last updated: 04/08/2009 UPDATED 10/30/2008 after review of old paper chart dating back to late 1990s  H. pylori negative 07/2001, Menopause at 60 y/o, on CPAP at night, sinus bradycardia with arrythmia noted on 01/21/01  #DISEASE, VOCAL CORD NEC (ICD-478.5)............DR Delford Field >mentioned in chart > Was followed by Dr. Annalee Genta ENT in late 1990s - lary showed  findings c/w GERD > Was also followed by Speech Clinic at [facility]. VCD suspected there. s/p speech therap in late 1990s  ACEi Intolerant OBESITY, NOS (ICD-278.00) LEG PAIN OR KNEE PAIN (ICD-729.5) HYPERTENSION, BENIGN SYSTEMIC (ICD-401.1) HYPERCHOLESTEROLEMIA (ICD-272.0) GASTROESOPHAGEAL REFLUX, NO ESOPHAGITIS (ICD-530.81) DIABETES MELLITUS II, UNCOMPLICATED (ICD-250.00) DEPRESSION, MAJOR, RECURRENT (ICD-296.30) ATRIAL FIBRILLATION (ICD-427.31)................[facility] Dr Norton Blizzard >PAF. s/p ablation >s/p pulmonary vein isolation 11/08/08 ASTHMA, UNSPECIFIED (ICD-493.90)....................DR Delford Field >charts mention "moderare persistent" severity.  >Last PFT was late 1990s per patient hx but in review  of paper chart Dr Marchelle Gearing could not find it july 2010 >Frequent exacerbagtions needing admits late 1990s >Positive skin testing for allergies - 1998 by Dr Sidney Ace >Normal spirometry on advair/singulair - july 2010 while symptomatic  #APNEA, SLEEP (ICD-780.57)....Dr. Vassie Loll  Past Surgical History: Last updated: 07/11/2009 Colonoscopy 06/2009: Pandiverticulosis. Dr Loreta Ave s/p pulmonary vein isolation 11/08/08 at [facility] ABI = 1.99 - 03/31/2001,  Cardiac cath - 08/11/2005, Cardiac Cath- N Ef, mild CAD - 04/13/2002,  Cardiolite - scar at apical/lat wall, pos ischemia ant/anteroseptal wall - 07/20/2005,  Cholecystectomy -,  Flex sigmoid - nl - 06/02/2005,  Nissen Fundoplication 1/98 -,  R Breast Bx - sclerosing adenosis 5/93 -,  R Knee Arthroscopy - 10/12/1998, Sleep study -[facility] - 04/13/2004  Family History: Last  updated: 2006-07-06 Father is deceased at 17 yo with DM and HTN., Mom died during a hysterectomy d/t a bleeding complication.  Social History: Last updated: Jul 06, 2006 Husband died December 11, 2022, lives w/ Daughter-Diana Kerwood. Nonsmoker, doesn't drink.  Involved in non-denominational [employer]. Not working  Risk Factors: Alcohol Use: 0 (12/19/2008) Diet: trying to eat healthy  (12/19/2008) Exercise: yes (09/23/2009)  Risk Factors: Smoking Status: never (09/23/2009)  Review of Systems       per hpi  Physical Exam  General:  Well-developed,well-nourished,in no acute distress; alert,appropriate and cooperative throughout examination. vitals reviewed.   Head:  normocephalic and atraumatic.   Neck:  supple and full ROM.   Lungs:  Normal respiratory effort, chest expands symmetrically. Lungs are clear to auscultation, no crackles or wheezes. Heart:  Normal rate and regular rhythm. S1 and S2 normal without gallop, murmur, click, rub or other extra sounds. Abdomen:  Bowel sounds positive,abdomen soft and non-tender without masses, organomegaly or hernias noted. overweight. Pulses:  R and L carotid,radial,,dorsalis pedis and  are full and equal bilaterally Extremities:  No clubbing, cyanosis, edema, or deformity noted with normal full range of motion of all joints.   Neurologic:  No cranial nerve deficits noted. Station and gait are normal. Plantar reflexes are down-going bilaterally. DTRs are symmetrical throughout. Sensory, motor and coordinative functions appear intact. Skin:  Intact without suspicious lesions or rashes Cervical Nodes:  No lymphadenopathy noted  Diabetes Management Exam:    Foot Exam (with socks and/or shoes not present):       Sensory-Pinprick/Light touch:          Left medial foot (L-4): normal          Left dorsal foot (L-5): normal          Left lateral foot (S-1): normal          Right medial foot (L-4): normal          Right dorsal foot (L-5): normal          Right lateral foot (S-1): normal       Sensory-Monofilament:          Left foot: normal          Right foot: normal       Inspection:          Left foot: normal          Right foot: normal       Nails:          Left foot: normal          Right foot: normal    Foot Exam by Podiatrist:       Date: 09/23/2009       Results: no diabetic findings       Done by: Angeline Slim, MD     Eye Exam:       Eye Exam done elsewhere   Impression & Recommendations:  Problem # 1:  DIABETES MELLITUS II, UNCOMPLICATED (ICD-250.00) Assessment Improved A1C 6.9.  At goal.  Continue current meds.  Eye exam appt next week.   Her updated medication list for this problem includes:    Cozaar 50 Mg Tabs (Losartan potassium) .Marland Kitchen... 1 tablet  by mouth daily daily    Glucophage 1000 Mg Tabs (Metformin hcl) ..... One-half  tablet by mouth in am and full tablet by mouth in pm    Glucotrol 5 Mg Tabs (Glipizide) ..... One tablet by mouth twice a day for diabetes  Orders: A1C-[facility] (60630)  [facility]- Est  Level 4 (99214)  Problem # 2:  HYPERTENSION, BENIGN SYSTEMIC (ICD-401.1) Assessment: Unchanged BP 120/80, at goal.  Continue current meds. Her updated medication list for this problem includes:    Cozaar 50 Mg Tabs (Losartan potassium) .Marland Kitchen... 1 tablet  by mouth daily daily    Diltiazem Hcl Er Beads 300 Mg Xr24h-cap (Diltiazem hcl er beads) ..... Once daily    Hydrochlorothiazide 25 Mg Tabs (Hydrochlorothiazide) .Marland Kitchen... 1/2 tab by mouth daily for blood pressure    Furosemide 40 Mg Tabs (Furosemide) .Marland Kitchen... Take one three times a day as needed  Orders: [facility]- Est  Level 4 (28413)  Problem # 3:  OBESITY, NOS (ICD-278.00) Assessment: Improved Lost 4 lbs since last visit.  Pt is starting to exercise, walking.  She is eating more fruits.  Discussed increasing exercise to 4-5 times per week.  Continue healthy eating habits.   Orders: [facility]- Est  Level 4 (24401)  Complete Medication List: 1)  Qvar 40 Mcg/act Aers (Beclomethasone dipropionate) .... Two puffs twice daily 2)  Allegra 180 Mg Tabs (Fexofenadine hcl) .... Take 1 tablet by mouth once a day 3)  Cozaar 50 Mg Tabs (Losartan potassium) .Marland Kitchen.. 1 tablet  by mouth daily daily 4)  Coumadin 5 Mg Tabs (Warfarin sodium) .... Take as directed 5)  Flonase 50 Mcg/act Susp (Fluticasone propionate) .... Spray 1 spray into both nostrils twice a day 6)  Nexium 40 Mg  Cpdr (Esomeprazole magnesium) .... One by mouth daily 7)  Singulair 10 Mg Tabs (Montelukast sodium) .... Take 1 tablet by mouth once a day per dr Delford Field 8)  Xopenex Hfa 45 Mcg/act Aero (Levalbuterol tartrate) .... Inhale 2 puff using inhaler every four hours per dr Delford Field 9)  Tikosyn Caps (dofetilide)  .... One by mouth two times a day per dr fitzgerald 10)  Cpap 11cmh20 Qhs  11)  Xopenex 0.63 Mg/70ml Nebu (Levalbuterol hcl) .... One in nebulizer four times daily as needed per dr Delford Field 12)  Pravastatin Sodium 20 Mg Tabs (Pravastatin sodium) .Marland Kitchen.. 1 once daily 13)  Diabetic Shoes  .... Wear regularly 14)  Glucophage 1000 Mg Tabs (Metformin hcl) .... One-half  tablet by mouth in am and full tablet by mouth in pm 15)  Glucotrol 5 Mg Tabs (Glipizide) .... One tablet by mouth twice a day for diabetes 16)  Lasix 40 Mg  .Marland Kitchen.. 1 tab 3 times per week dr little 17)  Diltiazem Hcl Er Beads 300 Mg Xr24h-cap (Diltiazem hcl er beads) .... Once daily 18)  Hydrochlorothiazide 25 Mg Tabs (Hydrochlorothiazide) .... 1/2 tab by mouth daily for blood pressure 19)  Furosemide 40 Mg Tabs (Furosemide) .... Take one three times a day as needed 20)  Prednisone 10 Mg Tabs (Prednisone) .... Take as directed 4 each am x3days, 3 x 3days, 2 x 3days, 1 x 3days then stop 21)  Tussionex Pennkinetic Er 8-10 Mg/3ml Lqcr (Chlorpheniramine-hydrocodone) .... 5 cc by mouth twice daily  Patient Instructions: 1)  Please schedule a follow-up appointment in 3 months for Diabetes.  2)  Congratulations on your weight loss!!! 3)  Keep up the good work!  Laboratory Results   Blood Tests   Date/Time Received: September 23, 2009 9:04 AM  Date/Time Reported: September 23, 2009 9:23 AM   HGBA1C: 6.9%   (Normal Range: Non-Diabetic - 3-6%   Control Diabetic - 6-8%)  Comments: ...........test performed by...........Marland KitchenTerese Door, CMA       Prevention & Chronic Care Immunizations   Influenza vaccine: Fluvax  MCR  (01/21/2009)    Influenza vaccine due: 01/08/2009    Tetanus booster: 01/11/2001: Done.   Tetanus booster due: 01/12/2011    Pneumococcal vaccine: Done.  (01/11/2001)   Pneumococcal vaccine due: None  Colorectal Screening   Hemoccult: Done.  (03/18/2005)   Hemoccult due: Not Indicated    Colonoscopy: Results:  PanDiverticulosis.  Small internal hemorroids  LOCATION: [facility].  Dr Loreta Ave        (07/05/2009)   Colonoscopy action/deferral: Repeat colonoscopy in 10 years.    (07/05/2009)   Colonoscopy due: 07/2019  Other Screening   Pap smear: normal  (05/13/2007)   Pap smear action/deferral: Deferred-3 yr interval  (04/08/2009)   Pap smear due: 05/12/2010    Mammogram: ASSESSMENT: Negative - BI-RADS 1^MM DIGITAL SCREENING  (03/27/2009)   Mammogram due: 03/27/2010   Smoking status: never  (09/23/2009)  Diabetes Mellitus   HgbA1C: 6.9  (09/23/2009)   Hemoglobin A1C due: 03/08/2008    Eye exam: normal  (08/13/2008)   Eye exam due: 08/2009    Foot exam: yes  (09/23/2009)   High risk foot: Not documented   Foot care education: Not documented   Foot exam due: 06/27/2008    Urine microalbumin/creatinine ratio: Not documented   Urine microalbumin/cr due: 12/06/2008    Diabetes flowsheet reviewed?: Yes   Progress toward A1C goal: At goal  Lipids   Total Cholesterol: 140  (12/27/2008)   LDL: 80  (12/27/2008)   LDL Direct: 94  (12/07/2007)   HDL: 52  (12/27/2008)   Triglycerides: 41  (12/27/2008)    SGOT (AST): 15  (04/30/2009)   SGPT (ALT): 18  (04/30/2009)   Alkaline phosphatase: 83  (04/30/2009)   Total bilirubin: 0.2  (04/30/2009)    Lipid flowsheet reviewed?: Yes   Progress toward LDL goal: At goal  Hypertension   Last Blood Pressure: 120 / 80  (09/23/2009)   Serum creatinine: 0.83  (04/30/2009)   Serum potassium 4.0  (04/30/2009)    Hypertension flowsheet reviewed?: Yes   Progress toward BP goal: At goal  Self-Management Support :   Personal Goals (by the  next clinic visit) :     Personal A1C goal: 8  (06/19/2009)     Personal blood pressure goal: 140/90  (05/07/2009)     Personal LDL goal: 100  (04/08/2009)    Diabetes self-management support: Written self-care plan  (09/23/2009)   Diabetes care plan printed    Hypertension self-management support: Written self-care plan  (09/23/2009)   Hypertension self-care plan printed.    Lipid self-management support: Written self-care plan  (09/23/2009)   Lipid self-care plan printed.

## 2010-05-13 NOTE — Miscellaneous (Signed)
Summary: Asthma   Clinical Lists Changes  Problems: Changed problem from ASTHMA, UNSPECIFIED (ICD-493.90) to ASTHMA, PERSISTENT (ICD-493.90)

## 2010-05-13 NOTE — Miscellaneous (Signed)
Summary: CXR   Clinical Lists Changes  Observations: Added new observation of CXR RESULTS: IMPRESSION:   1.  Nearly completely resolved right lower lobe airspace disease, most consistent with infection. Recommend radiographic follow-up until clearing. 2. Cardiomegaly without congestive failure. (01/28/2010 10:26)      CXR  Procedure date:  01/28/2010  Findings:      IMPRESSION:   1.  Nearly completely resolved right lower lobe airspace disease, most consistent with infection. Recommend radiographic follow-up until clearing. 2. Cardiomegaly without congestive failure.

## 2010-05-13 NOTE — Assessment & Plan Note (Signed)
Summary: Acute NP Pulmonary OV   Copy to:  Dr. Shan Levans Primary Provider/Referring Provider:  Cat Ta MD  CC:  OV - Increased sob and wheezing since last night - Diff sleeping due to wheezing - Wheezing at night for 3 weeks - Occas prod cough (white) - chest tightness - Last used neb 2 hrs ago - Finfshed 5 days Prednisone 1 week ago for leg pain.  History of Present Illness: 60  year-old, African-American  female, history of moderate persistent asthma and reflux disease.  Hx of VCD and cyclic cough.  Hx GERD  October 15, 2009 10:00 AM The pt is better vs May OV with cough.  The  dyspnea is better and is sleeping better.  No new issues.   November 19, 2009--Presents for an acute office visit. Complains of wheezing, increased SOB, dry cough, chest congestion onset last night. Symptoms started last night, worse this am. Cough is dry, no discolored mucus. Cough is aggravating. Denies chest pain,  orthopnea, hemoptysis, fever, n/v/d, edema, headache.   December 09, 2009 9:20 AM The pt had another flare up of VCD and bronchitis on  11/19/09 and saw NP.   The pt was rx with  qvar and dexilant.  The pt is now is better.  The pt is holding advair at present.   January 21, 2010 --Presents for an acute office visit. Complains of  Increased sob and wheezing since last night. Complains she had trouble sleeping due to wheezing. Has on/off again wheezing at night for 3 weeks. Has occasional  productive  cough with thick white mucus. with  chest tightness and wheezing. Used  used neb 2 hrs ago. Was seen recently for arthritis flare and leg pain tx w/ steroids. She finished Prednisone 1 week ago for leg pain. Denies chest pain, orthopnea, hemoptysis, fever, n/v/d, edema, headache.   Current Medications (verified): 1)  Advair Diskus 250-50 Mcg/dose Aepb (Fluticasone-Salmeterol) .... Inhale 1 Puff Two Times A Day 2)  Loratadine 10 Mg Tabs (Loratadine) .... Take 1 Tablet By Mouth Once A Day 3)  Cozaar 50 Mg Tabs  (Losartan Potassium) .Marland Kitchen.. 1 Tablet  By Mouth Daily Daily 4)  Coumadin 5 Mg Tabs (Warfarin Sodium) .... Take As Directed 5)  Flonase 50 Mcg/act Susp (Fluticasone Propionate) .... Spray 1 Spray Into Both Nostrils Twice A Day 6)  Nexium 40 Mg Cpdr (Esomeprazole Magnesium) .... One By Mouth Daily 7)  Singulair 10 Mg Tabs (Montelukast Sodium) .... Take 1 Tablet By Mouth Once A Day Per Dr Delford Field 8)  Xopenex Hfa 45 Mcg/act Aero (Levalbuterol Tartrate) .... Inhale 2 Puff Using Inhaler Every Four Hours As Needed 9)  Tikosyn  Caps (Dofetilide) .... One By Mouth Two Times A Day Per Dr Sampson Goon 10)  Cpap 11cmh20 Qhs 11)  Xopenex 0.63 Mg/16ml  Nebu (Levalbuterol Hcl) .... One in Nebulizer Four Times Daily As Needed Per Dr Delford Field 12)  Pravastatin Sodium 20 Mg Tabs (Pravastatin Sodium) .Marland Kitchen.. 1 Once Daily 13)  Diabetic Shoes .... Wear Regularly 14)  Glucophage 500 Mg Tabs (Metformin Hcl) .Marland Kitchen.. 1 Tab By Mouth Two Times A Day For Diabetes 15)  Glucotrol 5 Mg Tabs (Glipizide) .... One Tablet By Mouth Twice A Day For Diabetes 16)  Diltiazem Hcl Er Beads 300 Mg Xr24h-Cap (Diltiazem Hcl Er Beads) .... Once Daily Per Dr Clarene Duke 17)  Hydrochlorothiazide 25 Mg Tabs (Hydrochlorothiazide) .... 1/2 Tab By Mouth Daily For Blood Pressure 18)  Furosemide 40 Mg Tabs (Furosemide) .... Take  One Three Times Per Week As Needed Per Dr Clarene Duke 19)  Lotrimin Af 1 % Crea (Clotrimazole) .... Apply To Affected Areas Two Times A Day. Dispense 30 Grams. 20)  Vicodin 5-500 Mg Tabs (Hydrocodone-Acetaminophen) .... One Tablet Every 6 Hours As Needed For Pain 21)  Ferrous Sulfate 325 (65 Fe) Mg Tabs (Ferrous Sulfate) .Marland Kitchen.. 1 Tab By Mouth Two Times A Day For Iron Deficiency  Allergies (verified): 1)  Ace Inhibitors  Comments:  Nurse/Medical Assistant: The patient's medications and allergies were reviewed with the patient and were updated in the Medication and Allergy Lists.  Past History:  Past Medical History: Last updated:  09/28/2009 UPDATED 10/30/2008 after review of old paper chart dating back to late 1990s  H. pylori negative 07/2001, Menopause at 60 y/o, on CPAP at night, sinus bradycardia with arrythmia noted on 01/21/01  #DISEASE, VOCAL CORD NEC (ICD-478.5)............DR Delford Field >mentioned in chart > Was followed by Dr. Annalee Genta ENT in late 1990s - lary showed findings c/w GERD > Was also followed by Speech Clinic at [facility]. VCD suspected there. s/p speech therap in late 1990s  ACEi Intolerant OBESITY, NOS (ICD-278.00) LEG PAIN OR KNEE PAIN (ICD-729.5) HYPERTENSION, BENIGN SYSTEMIC (ICD-401.1) HYPERCHOLESTEROLEMIA (ICD-272.0) GASTROESOPHAGEAL REFLUX, NO ESOPHAGITIS (ICD-530.81) DIABETES MELLITUS II, UNCOMPLICATED (ICD-250.00) DEPRESSION, MAJOR, RECURRENT (ICD-296.30) ATRIAL FIBRILLATION (ICD-427.31)................[facility], Dr Clarene Duke >PAF. s/p ablation >s/p pulmonary vein isolation 11/08/08 >Followed by [facility] ASTHMA, UNSPECIFIED (ICD-493.90)....................DR Delford Field >charts mention "moderare persistent" severity.  >Last PFT was late 1990s per patient hx but in review  of paper chart Dr Marchelle Gearing could not find it july 2010 >Frequent exacerbagtions needing admits late 1990s >Positive skin testing for allergies - 1998 by Dr Sidney Ace >Normal spirometry on advair/singulair - july 2010 while symptomatic  #APNEA, SLEEP (ICD-780.57)....Dr. Vassie Loll  Past Surgical History: Last updated: 07/11/2009 Colonoscopy 06/2009: Pandiverticulosis. Dr Loreta Ave s/p pulmonary vein isolation 11/08/08 at [facility] ABI = 1.99 - 03/31/2001,  Cardiac cath - 08/11/2005, Cardiac Cath- N Ef, mild CAD - 04/13/2002,  Cardiolite - scar at apical/lat wall, pos ischemia ant/anteroseptal wall - 07/20/2005,  Cholecystectomy -,  Flex sigmoid - nl - 06/02/2005,  Nissen Fundoplication 1/98 -,  R Breast Bx - sclerosing adenosis 5/93 -,  R Knee Arthroscopy - 10/12/1998, Sleep study -[facility] - 04/13/2004  Social History: Last  updated: 06/21/06 Husband died 11/26/22, lives w/ Daughter-Diana Maisie Fus. Nonsmoker, doesn't drink.  Involved in non-denominational [employer]. Not working  Review of Systems      See HPI  Vital Signs:  Patient profile:   59 year old female Weight:      216.38 pounds O2 Sat:      97 % on Room air Temp:     98.7 degrees F oral Pulse rate:   86 / minute BP sitting:   112 / 64  (right arm) Cuff size:   regular  Vitals Entered By: Abigail Miyamoto RN (January 21, 2010 2:34 PM)  O2 Flow:  Room air  Physical Exam  Additional Exam:  GEN: A/Ox3; pleasant , NAD HEENT:  [place]/AT, , EACs-clear, TMs-wnl, NOSE-clear, THROAT-clear NECK:  Supple w/ fair ROM; no JVD; normal carotid impulses w/o bruits; no thyromegaly or nodules palpated; no lymphadenopathy. RESP  Coarse BS w/ no wheeizng noted.  CARD:  RRR, no m/r/g   GI:   Soft & nt; nml bowel sounds; no organomegaly or masses detected. [place]co: Warm bil,  no calf tenderness edema, clubbing, pulses intact Neuro: intact w/ no focal deficitis detected.    Impression & Recommendations:  Problem # 1:  ASTHMA, UNSPECIFIED (ICD-493.90) Atypical asthma w/ flare check cxr today.  Plan Brush/rinse and gargle after inhaler use.   Delsym 2 tsp every 12  as needed for cough Tessalon three times a day as needed cough  For severe cough may use hydromet 1-2 tsp every 4-6 hr as needed cough-may make you sleepy.   Use Dexilant one daily until samples gone then resume nexium, hold nexium while on dexilant  Follow Reflux diet  Return 2 weeks Dr. Delford Field and as needed   use sugarless candy, ice chips, water to help avoid cough and throat clearing.   Medications Added to Medication List This Visit: 1)  Tikosyn 250 Mcg Caps (Dofetilide) .Marland Kitchen.. 1 by mouth two times a day 2)  Tessalon 200 Mg Caps (Benzonatate) .Marland Kitchen.. 1 by mouth three times a day as needed cough  Complete Medication List: 1)  Advair Diskus 250-50 Mcg/dose Aepb (Fluticasone-salmeterol) .... Inhale  1 puff two times a day 2)  Loratadine 10 Mg Tabs (Loratadine) .... Take 1 tablet by mouth once a day 3)  Cozaar 50 Mg Tabs (Losartan potassium) .Marland Kitchen.. 1 tablet  by mouth daily daily 4)  Coumadin 5 Mg Tabs (Warfarin sodium) .... Take as directed 5)  Flonase 50 Mcg/act Susp (Fluticasone propionate) .... Spray 1 spray into both nostrils twice a day 6)  Nexium 40 Mg Cpdr (Esomeprazole magnesium) .... One by mouth daily 7)  Singulair 10 Mg Tabs (Montelukast sodium) .... Take 1 tablet by mouth once a day per dr Delford Field 8)  Xopenex Hfa 45 Mcg/act Aero (Levalbuterol tartrate) .... Inhale 2 puff using inhaler every four hours as needed 9)  Tikosyn 250 Mcg Caps (Dofetilide) .Marland Kitchen.. 1 by mouth two times a day 10)  Cpap 11cmh20 Qhs  11)  Xopenex 0.63 Mg/67ml Nebu (Levalbuterol hcl) .... One in nebulizer four times daily as needed per dr Delford Field 12)  Pravastatin Sodium 20 Mg Tabs (Pravastatin sodium) .Marland Kitchen.. 1 once daily 13)  Diabetic Shoes  .... Wear regularly 14)  Glucophage 500 Mg Tabs (Metformin hcl) .Marland Kitchen.. 1 tab by mouth two times a day for diabetes 15)  Glucotrol 5 Mg Tabs (Glipizide) .... One tablet by mouth twice a day for diabetes 16)  Diltiazem Hcl Er Beads 300 Mg Xr24h-cap (Diltiazem hcl er beads) .... Once daily per dr little 17)  Hydrochlorothiazide 25 Mg Tabs (Hydrochlorothiazide) .... 1/2 tab by mouth daily for blood pressure 18)  Furosemide 40 Mg Tabs (Furosemide) .... Take one three times per week as needed per dr little 19)  Lotrimin Af 1 % Crea (Clotrimazole) .... Apply to affected areas two times a day. dispense 30 grams. 20)  Vicodin 5-500 Mg Tabs (Hydrocodone-acetaminophen) .... One tablet every 6 hours as needed for pain 21)  Ferrous Sulfate 325 (65 Fe) Mg Tabs (Ferrous sulfate) .Marland Kitchen.. 1 tab by mouth two times a day for iron deficiency 22)  Tessalon 200 Mg Caps (Benzonatate) .Marland Kitchen.. 1 by mouth three times a day as needed cough  Other Orders: T-2 View CXR (71020TC) Est. Patient Level IV  (14782)  Patient Instructions: 1)  Brush/rinse and gargle after inhaler use.  2)   Delsym 2 tsp every 12  as needed for cough 3)  Tessalon three times a day as needed cough  4)  For severe cough may use hydromet 1-2 tsp every 4-6 hr as needed cough-may make you sleepy.  5)   Use Dexilant one daily until samples gone then resume nexium, hold nexium while on dexilant  6)   Follow Reflux diet 7)   Return 2 weeks Dr. Delford Field and as needed  8)   use sugarless candy, ice chips, water to help avoid cough and throat clearing.  Prescriptions: TESSALON 200 MG CAPS (BENZONATATE) 1 by mouth three times a day as needed cough  #30 x 1   Entered and Authorized by:   Rubye Oaks NP   Signed by:   Tammy Parrett NP on 01/21/2010   Method used:   Electronically to        [facility]  [address] [id number] (retail)       [address]       [place], [place]  [address]       Ph: [phone] or [phone]       Fax: [phone]   RxID:   [id number]   Appended Document: WHEEZING/ COUGHING BURNING IN Agree,  Note pt has bronchopneumonia.  Avelox was started x 7days

## 2010-05-13 NOTE — Letter (Signed)
Summary: Generic Letter  [facility]  [address]   [place], [place] [address]   Phone: [phone]  Fax: [phone]    07/01/2009  [name] [address] [place], [place]  [address]  Dear [name] with [facility]:  [name] may be off coumadin for 5 days prior to her extractions of her teeth.  It would be ideal if she could co-ordinate this with her other procedure she is having done. she needs to be off coumadin for that as well.     Sincerely,   Golden Circle RN

## 2010-05-13 NOTE — Miscellaneous (Signed)
Summary: [facility]   Clinical Lists Changes  Observations: Added new observation of COLONNXTDUE: 07/2019 (07/11/2009 4:42) Added new observation of COLONRECACT: Repeat colonoscopy in 10 years.   (07/05/2009 4:43) Added new observation of COLONOSCOPY: Results:  PanDiverticulosis.  Small internal hemorroids  LOCATION: [facility].  Dr Loreta Ave       (07/05/2009 4:43)      Colonoscopy  Procedure date:  07/05/2009  Findings:      Results:  PanDiverticulosis.  Small internal hemorroids  LOCATION: [facility].  Dr Loreta Ave        Comments:      Repeat colonoscopy in 10 years.    Procedures Next Due Date:    Colonoscopy: 07/2019

## 2010-05-13 NOTE — Miscellaneous (Signed)
Summary: premed?   Clinical Lists Changes Angie from [facility] wants to know if pt needs to be pre-medicated before getting a crown. will call her at [phone] when md responds.Golden Circle RN  July 08, 2009 3:38 PM  I do not see any valvular heart disease that would indicate need for prophylaxis prior to dental work.  What are they asking about specifically? Sayde Lish MD  July 08, 2009 3:45 PM  LM for them to call back.Golden Circle RN  July 08, 2009 4:30 PM  Angie states the pt has aPPT TODAY FOR A CROWN. THEY WILL BE WORKING AT THE [facility]. PT IS ON COUMADIN. OK TO PROCEED?...Marland KitchenMarland KitchenGolden Circle RN  July 09, 2009 9:54 AM  She should be ok to remain on the coumadin.  Restorative procedures are low - moderate risk for bleeding while on warfarin, so no need to hold it.  We would suggest Amicar mouthwash, which they probably have the dental office. Sarah Swaziland MD  July 09, 2009 10:10 AM  spoke with Angie & told her md's response.Marland KitchenMarland KitchenGolden Circle RN  July 09, 2009 10:17 AM  Observations: Added new observation of REFERRING MD: Dr. Shan Levans (07/08/2009 15:37) Added new observation of PRIMARY MD: Kaytlyn Din MD (07/08/2009 15:37)       Past History:  Past Medical History: Last updated: 04/08/2009 UPDATED 10/30/2008 after review of old paper chart dating back to late 1990s  H. pylori negative 07/2001, Menopause at 60 y/o, on CPAP at night, sinus bradycardia with arrythmia noted on 01/21/01  #DISEASE, VOCAL CORD NEC (ICD-478.5)............DR Delford Field >mentioned in chart > Was followed by Dr. Annalee Genta ENT in late 1990s - lary showed findings c/w GERD > Was also followed by [facility] at [facility]. VCD suspected there. s/p speech therap in late 1990s  ACEi Intolerant OBESITY, NOS (ICD-278.00) LEG PAIN OR KNEE PAIN (ICD-729.5) HYPERTENSION, BENIGN SYSTEMIC (ICD-401.1) HYPERCHOLESTEROLEMIA (ICD-272.0) GASTROESOPHAGEAL REFLUX, NO ESOPHAGITIS (ICD-530.81) DIABETES MELLITUS II,  UNCOMPLICATED (ICD-250.00) DEPRESSION, MAJOR, RECURRENT (ICD-296.30) ATRIAL FIBRILLATION (ICD-427.31)................[facility] Dr Norton Blizzard >PAF. s/p ablation >s/p pulmonary vein isolation 11/08/08 ASTHMA, UNSPECIFIED (ICD-493.90)....................DR Delford Field >charts mention "moderare persistent" severity.  >Last PFT was late 1990s per patient hx but in review  of paper chart Dr Marchelle Gearing could not find it july 2010 >Frequent exacerbagtions needing admits late 1990s >Positive skin testing for allergies - 1998 by Dr Sidney Ace >Normal spirometry on advair/singulair - july 2010 while symptomatic  #APNEA, SLEEP (ICD-780.57)....Dr. Vassie Loll  Past Surgical History: Last updated: 02/16/2009 s/p pulmonary vein isolation 11/08/08 at [facility] ABI = 1.99 - 03/31/2001,  Cardiac cath - 08/11/2005, Cardiac Cath- N Ef, mild CAD - 04/13/2002,  Cardiolite - scar at apical/lat wall, pos ischemia ant/anteroseptal wall - 07/20/2005,  Cholecystectomy -,  Flex sigmoid - nl - 06/02/2005,  Nissen Fundoplication 1/98 -,  R Breast Bx - sclerosing adenosis 5/93 -,  R Knee Arthroscopy - 10/12/1998, Sleep study -[facility] - 04/13/2004

## 2010-05-13 NOTE — Assessment & Plan Note (Signed)
Summary: Acute NP office visit - asthma   Copy to:  Dr. Shan Levans Primary Rich Paprocki/Referring Lakya Schrupp:  Cat Ta MD  CC:  wheezing, increased SOB, dry cough, and chest congestion onset last night.  History of Present Illness: 60  year-old, African-American  female, history of moderate persistent asthma and reflux disease.  Hx of VCD and cyclic cough.  Hx GERD  October 15, 2009 10:00 AM The pt is better vs May OV with cough.  The  dyspnea is better and is sleeping better.  No new issues.   November 19, 2009--Presents for an acute office visit. Complains of wheezing, increased SOB, dry cough, chest congestion onset last night. Symptoms started last night, worse this am. Cough is dry, no discolored mucus. Cough is aggravating. Denies chest pain,  orthopnea, hemoptysis, fever, n/v/d, edema, headache.   Medications Prior to Update: 1)  Advair Diskus 250-50 Mcg/dose Aepb (Fluticasone-Salmeterol) .... Inhale 1 Puff Two Times A Day 2)  Loratadine 10 Mg Tabs (Loratadine) .... Take 1 Tablet By Mouth Once A Day 3)  Cozaar 50 Mg Tabs (Losartan Potassium) .Marland Kitchen.. 1 Tablet  By Mouth Daily Daily 4)  Coumadin 5 Mg Tabs (Warfarin Sodium) .... Take As Directed 5)  Flonase 50 Mcg/act Susp (Fluticasone Propionate) .... Spray 1 Spray Into Both Nostrils Twice A Day 6)  Nexium 40 Mg Cpdr (Esomeprazole Magnesium) .... One By Mouth Daily 7)  Singulair 10 Mg Tabs (Montelukast Sodium) .... Take 1 Tablet By Mouth Once A Day Per Dr Delford Field 8)  Xopenex Hfa 45 Mcg/act Aero (Levalbuterol Tartrate) .... Inhale 2 Puff Using Inhaler Every Four Hours As Needed 9)  Tikosyn  Caps (Dofetilide) .... One By Mouth Two Times A Day Per Dr Sampson Goon 10)  Cpap 11cmh20 Qhs 11)  Xopenex 0.63 Mg/46ml  Nebu (Levalbuterol Hcl) .... One in Nebulizer Four Times Daily As Needed Per Dr Delford Field 12)  Pravastatin Sodium 20 Mg Tabs (Pravastatin Sodium) .Marland Kitchen.. 1 Once Daily 13)  Diabetic Shoes .... Wear Regularly 14)  Glucophage 1000 Mg Tabs  (Metformin Hcl) .... One-Half  Tablet By Mouth in Am and Full Tablet By Mouth in Pm 15)  Glucotrol 5 Mg Tabs (Glipizide) .... One Tablet By Mouth Twice A Day For Diabetes 16)  Diltiazem Hcl Er Beads 300 Mg Xr24h-Cap (Diltiazem Hcl Er Beads) .... Once Daily 17)  Hydrochlorothiazide 25 Mg Tabs (Hydrochlorothiazide) .... 1/2 Tab By Mouth Daily For Blood Pressure 18)  Furosemide 40 Mg Tabs (Furosemide) .... Take One Three Times A Day As Needed  Current Medications (verified): 1)  Advair Diskus 250-50 Mcg/dose Aepb (Fluticasone-Salmeterol) .... Inhale 1 Puff Two Times A Day 2)  Loratadine 10 Mg Tabs (Loratadine) .... Take 1 Tablet By Mouth Once A Day 3)  Cozaar 50 Mg Tabs (Losartan Potassium) .Marland Kitchen.. 1 Tablet  By Mouth Daily Daily 4)  Coumadin 5 Mg Tabs (Warfarin Sodium) .... Take As Directed 5)  Flonase 50 Mcg/act Susp (Fluticasone Propionate) .... Spray 1 Spray Into Both Nostrils Twice A Day 6)  Nexium 40 Mg Cpdr (Esomeprazole Magnesium) .... One By Mouth Daily 7)  Singulair 10 Mg Tabs (Montelukast Sodium) .... Take 1 Tablet By Mouth Once A Day Per Dr Delford Field 8)  Xopenex Hfa 45 Mcg/act Aero (Levalbuterol Tartrate) .... Inhale 2 Puff Using Inhaler Every Four Hours As Needed 9)  Tikosyn  Caps (Dofetilide) .... One By Mouth Two Times A Day Per Dr Sampson Goon 10)  Cpap 11cmh20 Qhs 11)  Xopenex 0.63 Mg/50ml  Nebu (Levalbuterol  Hcl) .... One in Nebulizer Four Times Daily As Needed Per Dr Delford Field 12)  Pravastatin Sodium 20 Mg Tabs (Pravastatin Sodium) .Marland Kitchen.. 1 Once Daily 13)  Diabetic Shoes .... Wear Regularly 14)  Glucophage 1000 Mg Tabs (Metformin Hcl) .... One-Half  Tablet By Mouth in Am and Full Tablet By Mouth in Pm 15)  Glucotrol 5 Mg Tabs (Glipizide) .... One Tablet By Mouth Twice A Day For Diabetes 16)  Diltiazem Hcl Er Beads 300 Mg Xr24h-Cap (Diltiazem Hcl Er Beads) .... Once Daily 17)  Hydrochlorothiazide 25 Mg Tabs (Hydrochlorothiazide) .... 1/2 Tab By Mouth Daily For Blood Pressure 18)   Furosemide 40 Mg Tabs (Furosemide) .... Take One Three Times A Day As Needed  Allergies (verified): 1)  Ace Inhibitors  Past History:  Past Surgical History: Last updated: 07/11/2009 Colonoscopy 06/2009: Pandiverticulosis. Dr Loreta Ave s/p pulmonary vein isolation 11/08/08 at [facility] ABI = 1.99 - 03/31/2001,  Cardiac cath - 08/11/2005, Cardiac Cath- N Ef, mild CAD - 04/13/2002,  Cardiolite - scar at apical/lat wall, pos ischemia ant/anteroseptal wall - 07/20/2005,  Cholecystectomy -,  Flex sigmoid - nl - 06/02/2005,  Nissen Fundoplication 1/98 -,  R Breast Bx - sclerosing adenosis 5/93 -,  R Knee Arthroscopy - 10/12/1998, Sleep study -[facility] - 04/13/2004  Family History: Last updated: July 03, 2006 Father is deceased at 27 yo with DM and HTN., Mom died during a hysterectomy d/t a bleeding complication.  Social History: Last updated: 03-Jul-2006 Husband died 12-08-2022, lives w/ Daughter-Diana Kalman. Nonsmoker, doesn't drink.  Involved in non-denominational [employer]. Not working  Risk Factors: Alcohol Use: 0 (10/15/2009) Diet: trying to eat healthy (12/19/2008) Exercise: yes (09/23/2009)  Risk Factors: Smoking Status: never (10/22/2009) Passive Smoke Exposure: no (10/22/2009)  Past Medical History: Reviewed history from 09/28/2009 and no changes required. UPDATED 10/30/2008 after review of old paper chart dating back to late 1990s  H. pylori negative 07/2001, Menopause at 60 y/o, on CPAP at night, sinus bradycardia with arrythmia noted on 01/21/01  #DISEASE, VOCAL CORD NEC (ICD-478.5)............DR Delford Field >mentioned in chart > Was followed by Dr. Annalee Genta ENT in late 1990s - lary showed findings c/w GERD > Was also followed by Speech Clinic at [facility]. VCD suspected there. s/p speech therap in late 1990s  ACEi Intolerant OBESITY, NOS (ICD-278.00) LEG PAIN OR KNEE PAIN (ICD-729.5) HYPERTENSION, BENIGN SYSTEMIC (ICD-401.1) HYPERCHOLESTEROLEMIA (ICD-272.0) GASTROESOPHAGEAL REFLUX, NO ESOPHAGITIS  (ICD-530.81) DIABETES MELLITUS II, UNCOMPLICATED (ICD-250.00) DEPRESSION, MAJOR, RECURRENT (ICD-296.30) ATRIAL FIBRILLATION (ICD-427.31)................[facility], Dr Clarene Duke >PAF. s/p ablation >s/p pulmonary vein isolation 11/08/08 >Followed by [facility] ASTHMA, UNSPECIFIED (ICD-493.90)....................DR Delford Field >charts mention "moderare persistent" severity.  >Last PFT was late 1990s per patient hx but in review  of paper chart Dr Marchelle Gearing could not find it july 2010 >Frequent exacerbagtions needing admits late 1990s >Positive skin testing for allergies - 1998 by Dr Sidney Ace >Normal spirometry on advair/singulair - july 2010 while symptomatic  #APNEA, SLEEP (ICD-780.57)....Dr. Vassie Loll  Review of Systems      See HPI  Vital Signs:  Patient profile:   60 year old female Height:      67 inches Weight:      215.13 pounds BMI:     33.82 O2 Sat:      99 % on Room air Temp:     99.0 degrees F oral Pulse rate:   68 / minute BP sitting:   134 / 92  (left arm) Cuff size:   regular  Vitals Entered By: Boone Master CNA/MA (November 19, 2009 10:22 AM)  O2 Flow:  Room air CC: wheezing, increased SOB, dry cough, chest congestion onset last night Is Patient Diabetic? Yes Comments Medications reviewed with patient Daytime contact number verified with patient. Boone Master CNA/MA  November 19, 2009 10:25 AM    Physical Exam  Additional Exam:  Gen: Pleasant, well-nourished, in no distress , normal affect ENT: clear  Neck: No JVD, no TMG, no carotid bruits Lungs: No use of accessory muscles, no dullness to percussion, upper airway psuedowheezing  Cardiovascular: RRR, heart sounds normal, no murmurs or gallops, no peripheral edema Musculoskeletal: No deformities, no cyanosis or clubbing      Impression & Recommendations:  Problem # 1:  ASTHMA, UNSPECIFIED (ICD-493.90)  Flare w/ associated VCD  REC: xopenex neb tx  Start Qvar two puff twice daily Prednisone  taper over next week.  Delsym 2 tsp every 12  as needed for cough For severe cough may use hydromet 1-2 tsp every 4-6 hr as needed cough-may make you sleepy.   Use Dexilant one daily until samples gone then resume nexium, hold nexium while on dexilant  Follow Reflux diet  Return 2 weeks Dr. Delford Field and as needed  use sugarless candy, ice chips, water to help avoid cough and throat clearing.  Please contact office for sooner follow up if symptoms do not improve or worsen   Orders: Est. Patient Level IV ([address])  Medications Added to Medication List This Visit: 1)  Hydromet 5-1.5 Mg/69ml Syrp (Hydrocodone-homatropine) .Marland Kitchen.. 1-2 tsp every 4-6 hr as needed cough , may make you sleepy 2)  Prednisone 10 Mg Tabs (Prednisone) .... 4 tabs for 2 days, then 3 tabs for 2 days, 2 tabs for 2 days, then 1 tab for 2 days, then stop  Complete Medication List: 1)  Advair Diskus 250-50 Mcg/dose Aepb (Fluticasone-salmeterol) .... Inhale 1 puff two times a day 2)  Loratadine 10 Mg Tabs (Loratadine) .... Take 1 tablet by mouth once a day 3)  Cozaar 50 Mg Tabs (Losartan potassium) .Marland Kitchen.. 1 tablet  by mouth daily daily 4)  Coumadin 5 Mg Tabs (Warfarin sodium) .... Take as directed 5)  Flonase 50 Mcg/act Susp (Fluticasone propionate) .... Spray 1 spray into both nostrils twice a day 6)  Nexium 40 Mg Cpdr (Esomeprazole magnesium) .... One by mouth daily 7)  Singulair 10 Mg Tabs (Montelukast sodium) .... Take 1 tablet by mouth once a day per dr Delford Field 8)  Xopenex Hfa 45 Mcg/act Aero (Levalbuterol tartrate) .... Inhale 2 puff using inhaler every four hours as needed 9)  Tikosyn Caps (dofetilide)  .... One by mouth two times a day per dr fitzgerald 10)  Cpap 11cmh20 Qhs  11)  Xopenex 0.63 Mg/58ml Nebu (Levalbuterol hcl) .... One in nebulizer four times daily as needed per dr Delford Field 12)  Pravastatin Sodium 20 Mg Tabs (Pravastatin sodium) .Marland Kitchen.. 1 once daily 13)  Diabetic Shoes  .... Wear regularly 14)  Glucophage 1000  Mg Tabs (Metformin hcl) .... One-half  tablet by mouth in am and full tablet by mouth in pm 15)  Glucotrol 5 Mg Tabs (Glipizide) .... One tablet by mouth twice a day for diabetes 16)  Diltiazem Hcl Er Beads 300 Mg Xr24h-cap (Diltiazem hcl er beads) .... Once daily 17)  Hydrochlorothiazide 25 Mg Tabs (Hydrochlorothiazide) .... 1/2 tab by mouth daily for blood pressure 18)  Furosemide 40 Mg Tabs (Furosemide) .... Take one three times a day as needed 19)  Hydromet 5-1.5 Mg/44ml Syrp (Hydrocodone-homatropine) .Marland Kitchen.. 1-2 tsp every 4-6 hr  as needed cough , may make you sleepy 20)  Prednisone 10 Mg Tabs (Prednisone) .... 4 tabs for 2 days, then 3 tabs for 2 days, 2 tabs for 2 days, then 1 tab for 2 days, then stop  Patient Instructions: 1)  Hold Advair 2)  Start Qvar two puff twice daily 3)  Prednisone taper over next week.  4)  Delsym 2 tsp every 12  as needed for cough 5)  For severe cough may use hydromet 1-2 tsp every 4-6 hr as needed cough-may make you sleepy.  6)   Use Dexilant one daily until samples gone then resume nexium, hold nexium while on dexilant 7)   Follow Reflux diet 8)   Return 2 weeks Dr. Delford Field and as needed  9)  use sugarless candy, ice chips, water to help avoid cough and throat clearing.  10)  Please contact office for sooner follow up if symptoms do not improve or worsen  Prescriptions: PREDNISONE 10 MG TABS (PREDNISONE) 4 tabs for 2 days, then 3 tabs for 2 days, 2 tabs for 2 days, then 1 tab for 2 days, then stop  #20 x 0   Entered and Authorized by:   Rubye Oaks NP   Signed by:   Tammy Parrett NP on 11/19/2009   Method used:   Electronically to        [facility]  [address] [id number] (retail)       [address]       [place], [place]  [address]       Ph: [phone] or [phone]       Fax: [phone]   RxID:   [id number] HYDROMET 5-1.5 MG/5ML SYRP (HYDROCODONE-HOMATROPINE) 1-2 tsp every 4-6 hr as needed cough , may make you sleepy  #8 oz x 0    Entered and Authorized by:   Rubye Oaks NP   Signed by:   Tammy Parrett NP on 11/19/2009   Method used:   Print then Give to Patient   RxID:   [id number]

## 2010-05-13 NOTE — Assessment & Plan Note (Signed)
Summary: F/U VISIT/[facility]   Vital Signs:  Patient profile:   60 year old female Height:      67 inches Weight:      214 pounds BMI:     33.64 Pulse rate:   78 / minute BP sitting:   145 / 84  (right arm) Cuff size:   large  Vitals Entered By: Arlyss Repress CMA, (May 07, 2009 10:13 AM) CC: f/up HTN. Is Patient Diabetic? Yes Pain Assessment Patient in pain? no        Primary Care Provider:  Yunior Jain MD  CC:  f/up HTN.Marland Kitchen  History of Present Illness: cc: f/u exam  60 y/o F with  HYPERTENSION Disease Monitoring  Blood pressure range: at home 125-130/70 Chest pain: no Dyspnea:no Medications Compliance: yes Lightheadedness:no  Edema:yes, started 1 wk ago.   Prevention Exercise: yes on bike, 20 min per day x 4 days/wk  Salt restriction: yes  DIABETES Meds: Metformin 500mg  in AM, 1000mg  in PM, glipizide 5mg  daily Complianceyes  DIET: yes Lightheadedness   Dizziness    Confusion    Shakiness    Abd  pain   Nausea    Vomiting      Bilateral Leg pain x 2 wks: pain usually at night when she lays down.  Described as burning sensation.  Pt has tried tylenol with some relief.  Pain in LE, but not knees.  Some pain in R hip also.  Walking upstairs does cause some pain, but this is not new.   Habits & Providers  Alcohol-Tobacco-Diet     Tobacco Status: never     Podiatrist: Karver Fadden, MD  Current Medications (verified): 1)  Advair Diskus 250-50 Mcg/dose Misc (Fluticasone-Salmeterol) .... Inhale 1 Puff As Directed Twice A Day 2)  Allegra 180 Mg Tabs (Fexofenadine Hcl) .... Take 1 Tablet By Mouth Once A Day 3)  Cozaar 50 Mg Tabs (Losartan Potassium) .Marland Kitchen.. 1 Tablet  By Mouth Daily Daily 4)  Coumadin 5 Mg Tabs (Warfarin Sodium) .... Take As Directed 5)  Cartia Xt 300 Mg Xr24h-Cap (Diltiazem Hcl Coated Beads) .Marland Kitchen.. 1 By Mouth Two Times A Day Per Dr Clarene Duke 6)  Flonase 50 Mcg/act Susp (Fluticasone Propionate) .... Spray 1 Spray Into Both Nostrils Twice A Day 7)  Nexium 40 Mg Cpdr  (Esomeprazole Magnesium) .... One By Mouth Daily 8)  Singulair 10 Mg Tabs (Montelukast Sodium) .... Take 1 Tablet By Mouth Once A Day Per Dr Delford Field 9)  Xopenex Hfa 45 Mcg/act Aero (Levalbuterol Tartrate) .... Inhale 2 Puff Using Inhaler Every Four Hours Per Dr Delford Field 10)  Tikosyn  Caps (Dofetilide) .... One By Mouth Two Times A Day Per Dr Sampson Goon 11)  Cpap 11cmh20 Qhs 12)  Xopenex 0.63 Mg/42ml  Nebu (Levalbuterol Hcl) .... One in Nebulizer Four Times Daily As Needed Per Dr Delford Field 13)  Simvastatin 20 Mg Tabs (Simvastatin) .Marland Kitchen.. 1 By Mouth Daily 14)  Diabetic Shoes .... Wear Regularly 15)  Glucophage 1000 Mg Tabs (Metformin Hcl) .... One-Half  Tablet By Mouth in Am and Full Tablet By Mouth in Pm 16)  Glucotrol 5 Mg Tabs (Glipizide) .... One Tablet By Mouth Twice A Day For Diabetes 17)  Lasix 20 Mg .Marland Kitchen.. 1 Tab Every Other Per Dr Clarene Duke 18)  Diltiazem Hcl Er Beads 300 Mg Xr24h-Cap (Diltiazem Hcl Er Beads) .... Once Daily 19)  Hydrochlorothiazide 25 Mg Tabs (Hydrochlorothiazide) .... 1/2 Tab By Mouth Daily For Blood Pressure  Allergies (verified): 1)  Ace Inhibitors  Physical  Exam  General:  Well-developed,well-nourished,in no acute distress; alert,appropriate and cooperative throughout examination. vitals reviewed Lungs:  Normal respiratory effort, chest expands symmetrically. Lungs are clear to auscultation, no crackles or wheezes. Heart:  Normal rate and regular rhythm. S1 and S2 normal without gallop, murmur, click, rub or other extra sounds. Abdomen:  Bowel sounds positive,abdomen soft and non-tender without masses, organomegaly or hernias noted. Pulses:  R and L radial, dorsalis pedis pulses are full and equal bilaterally Extremities:  1+ left pedal edema and 1+ right pedal edema.   Neurologic:  alert & oriented X3 and cranial nerves II-XII intact.   Skin:  Intact without suspicious lesions or rashes Cervical Nodes:  No lymphadenopathy noted  Diabetes Management Exam:    Foot  Exam (with socks and/or shoes not present):       Sensory-Pinprick/Light touch:          Left medial foot (L-4): normal          Left dorsal foot (L-5): normal          Left lateral foot (S-1): normal          Right medial foot (L-4): normal          Right dorsal foot (L-5): normal          Right lateral foot (S-1): normal       Sensory-Monofilament:          Left foot: normal          Right foot: normal       Inspection:          Left foot: normal          Right foot: normal       Nails:          Left foot: normal          Right foot: normal    Foot Exam by Podiatrist:       Date: 05/07/2009       Results: no diabetic findings       Done by: Angeline Slim, MD   Impression & Recommendations:  Problem # 1:  LEG PAIN, BILATERAL (ICD-729.5) Assessment New New leg pain present for a while, but worse in last few wks.  Pain described as burning, tingling sensation.  Pain worse at night.  May be periph neuropathy.  Will try gabapentin for a few wks . Orders: [facility]- Est  Level 4 (57846)  Problem # 2:  DIABETES MELLITUS II, UNCOMPLICATED (ICD-250.00) Assessment: Unchanged New regimen for Glipizide is 5 mg two times a day since pt not able to tolerate metformin 1000mg  two times a day.  She will continue to take metformin 500mg  in AM and 1000mg  in PM.   Her updated medication list for this problem includes:    Cozaar 50 Mg Tabs (Losartan potassium) .Marland Kitchen... 1 tablet  by mouth daily daily    Glucophage 1000 Mg Tabs (Metformin hcl) ..... One-half  tablet by mouth in am and full tablet by mouth in pm    Glucotrol 5 Mg Tabs (Glipizide) ..... One tablet by mouth twice a day for diabetes  Orders: [facility]- Est  Level 4 (96295)  Problem # 3:  HYPERTENSION, BENIGN SYSTEMIC (ICD-401.1) Assessment: Deteriorated BP today elevated from previous and pt's baseline.  Since this is an isolated incident, will not make changes to BP meds.  Pt is due for f/u exam with Dr Clarene Duke.  He has been handling her cardiac meds.    Her  updated medication list for this problem includes:    Cozaar 50 Mg Tabs (Losartan potassium) .Marland Kitchen... 1 tablet  by mouth daily daily    Diltiazem Hcl Er Beads 300 Mg Xr24h-cap (Diltiazem hcl er beads) ..... Once daily    Hydrochlorothiazide 25 Mg Tabs (Hydrochlorothiazide) .Marland Kitchen... 1/2 tab by mouth daily for blood pressure  Orders: [facility]- Est  Level 4 (99214)  Complete Medication List: 1)  Advair Diskus 250-50 Mcg/dose Misc (Fluticasone-salmeterol) .... Inhale 1 puff as directed twice a day 2)  Allegra 180 Mg Tabs (Fexofenadine hcl) .... Take 1 tablet by mouth once a day 3)  Cozaar 50 Mg Tabs (Losartan potassium) .Marland Kitchen.. 1 tablet  by mouth daily daily 4)  Coumadin 5 Mg Tabs (Warfarin sodium) .... Take as directed 5)  Cartia Xt 300 Mg Xr24h-cap (diltiazem Hcl Coated Beads)  .Marland Kitchen.. 1 by mouth two times a day per dr little 6)  Flonase 50 Mcg/act Susp (Fluticasone propionate) .... Spray 1 spray into both nostrils twice a day 7)  Nexium 40 Mg Cpdr (Esomeprazole magnesium) .... One by mouth daily 8)  Singulair 10 Mg Tabs (Montelukast sodium) .... Take 1 tablet by mouth once a day per dr Delford Field 9)  Xopenex Hfa 45 Mcg/act Aero (Levalbuterol tartrate) .... Inhale 2 puff using inhaler every four hours per dr Delford Field 10)  Tikosyn Caps (dofetilide)  .... One by mouth two times a day per dr fitzgerald 11)  Cpap 11cmh20 Qhs  12)  Xopenex 0.63 Mg/20ml Nebu (Levalbuterol hcl) .... One in nebulizer four times daily as needed per dr Delford Field 13)  Simvastatin 20 Mg Tabs (Simvastatin) .Marland Kitchen.. 1 by mouth daily 14)  Diabetic Shoes  .... Wear regularly 15)  Glucophage 1000 Mg Tabs (Metformin hcl) .... One-half  tablet by mouth in am and full tablet by mouth in pm 16)  Glucotrol 5 Mg Tabs (Glipizide) .... One tablet by mouth twice a day for diabetes 17)  Lasix 20 Mg  .Marland Kitchen.. 1 tab every other per dr little 18)  Diltiazem Hcl Er Beads 300 Mg Xr24h-cap (Diltiazem hcl er beads) .... Once daily 19)  Hydrochlorothiazide 25 Mg  Tabs (Hydrochlorothiazide) .... 1/2 tab by mouth daily for blood pressure 20)  Gabapentin 300 Mg Caps (Gabapentin) .Marland Kitchen.. 1 tab by mouth on day 1.  day 2 two times a day. from day 3 on three times a day  Patient Instructions: 1)  Please schedule a follow-up appointment in 3 months for DM, HTN.  2)  New medication for leg pain is called Gabapentin 300mg .  On day 1, take 1 tab.  On day 2 take 1 tab two times a day.  From days 3 on take 1 tab three times a day.  Try this for 3-4 weeks.  If it does not help leg pain please call me.   Prescriptions: GABAPENTIN 300 MG CAPS (GABAPENTIN) 1 tab by mouth on day 1.  Day 2 two times a day. From day 3 on three times a day  #90 x 3   Entered and Authorized by:   Angeline Slim MD   Signed by:   Angeline Slim MD on 05/07/2009   Method used:   Electronically to        [facility]  [address] [id number] (retail)       [address]       [place], [place]  [address]       Ph: [phone] or [phone]  Fax: [phone]   RxID:   [id number] GLUCOTROL 5 MG TABS (GLIPIZIDE) one tablet by mouth twice a day for diabetes  #66 x 6   Entered and Authorized by:   Angeline Slim MD   Signed by:   Angeline Slim MD on 05/07/2009   Method used:   Electronically to        [facility]  [address] [id number] (retail)       [address]       [place], [place]  [address]       Ph: [phone] or [phone]       Fax: [phone]   RxID:   [id number]    Prevention & Chronic Care Immunizations   Influenza vaccine: Fluvax MCR  (01/21/2009)   Influenza vaccine due: 01/08/2009    Tetanus booster: 01/11/2001: Done.   Tetanus booster due: 01/12/2011    Pneumococcal vaccine: Done.  (01/11/2001)   Pneumococcal vaccine due: None  Colorectal Screening   Hemoccult: Done.  (03/18/2005)   Hemoccult due: Not Indicated    Colonoscopy: Not documented   Colonoscopy action/deferral: GI referral  (05/07/2009)   Colonoscopy due: 12/07/2010  Other Screening   Pap  smear: normal  (05/13/2007)   Pap smear action/deferral: Deferred-3 yr interval  (04/08/2009)   Pap smear due: 05/12/2010    Mammogram: ASSESSMENT: Negative - BI-RADS 1^MM DIGITAL SCREENING  (03/27/2009)   Mammogram due: 03/27/2010   Smoking status: never  (05/07/2009)  Diabetes Mellitus   HgbA1C: 7.7  (04/08/2009)   Hemoglobin A1C due: 03/08/2008    Eye exam: normal  (08/13/2008)   Eye exam due: 08/2009    Foot exam: yes  (05/07/2009)   High risk foot: Not documented   Foot care education: Not documented   Foot exam due: 06/27/2008    Urine microalbumin/creatinine ratio: Not documented   Urine microalbumin/cr due: 12/06/2008    Diabetes flowsheet reviewed?: Yes   Progress toward A1C goal: Unchanged  Lipids   Total Cholesterol: 140  (12/27/2008)   LDL: 80  (12/27/2008)   LDL Direct: 94  (12/07/2007)   HDL: 52  (12/27/2008)   Triglycerides: 41  (12/27/2008)    SGOT (AST): 15  (04/30/2009)   SGPT (ALT): 18  (04/30/2009)   Alkaline phosphatase: 83  (04/30/2009)   Total bilirubin: 0.2  (04/30/2009)    Lipid flowsheet reviewed?: Yes   Progress toward LDL goal: At goal  Hypertension   Last Blood Pressure: 145 / 84  (05/07/2009)   Serum creatinine: 0.83  (04/30/2009)   Serum potassium 4.0  (04/30/2009)    Hypertension flowsheet reviewed?: Yes   Progress toward BP goal: At goal  Self-Management Support :   Personal Goals (by the next clinic visit) :     Personal A1C goal: 7  (04/08/2009)     Personal blood pressure goal: 140/90  (05/07/2009)     Personal LDL goal: 100  (04/08/2009)    Diabetes self-management support: Written self-care plan  (05/07/2009)   Diabetes care plan printed    Hypertension self-management support: Written self-care plan  (05/07/2009)   Hypertension self-care plan printed.    Lipid self-management support: Written self-care plan  (05/07/2009)   Lipid self-care plan printed.

## 2010-05-13 NOTE — Assessment & Plan Note (Signed)
Summary: Pulmonary OV   Copy to:  Dr. Shan Levans Primary Provider/Referring Provider:  Cat Ta MD  CC:  , 1 week follow up with CXR, prod cough white, states she feels better, less sob , and compliant  with cpap averages 6 hrs per night .  History of Present Illness: 60  year-old, African-American  female, history of moderate persistent asthma and reflux disease.  Hx of VCD and cyclic cough.  Hx GERD  October 15, 2009 10:00 AM The pt is better vs May OV with cough.  The  dyspnea is better and is sleeping better.  No new issues.   November 19, 2009--Presents for an acute office visit. Complains of wheezing, increased SOB, dry cough, chest congestion onset last night. Symptoms started last night, worse this am. Cough is dry, no discolored mucus. Cough is aggravating. Denies chest pain,  orthopnea, hemoptysis, fever, n/v/d, edema, headache.   December 09, 2009 9:20 AM The pt had another flare up of VCD and bronchitis on  11/19/09 and saw NP.   The pt was rx with  qvar and dexilant.  The pt is now is better.  The pt is holding advair at present.   January 21, 2010 --Presents for an acute office visit. Complains of  Increased sob and wheezing since last night. Complains she had trouble sleeping due to wheezing. Has on/off again wheezing at night for 3 weeks. Has occasional  productive  cough with thick white mucus. with  chest tightness and wheezing. Used  used neb 2 hrs ago. Was seen recently for arthritis flare and leg pain tx w/ steroids. She finished Prednisone 1 week ago for leg pain. Denies chest pain, orthopnea, hemoptysis, fever, n/v/d, edema, headache.   January 28, 2010 10:01 AM Pt feels better and had RLL PNA.  Pt rx with avelox x 7days and is now better. Symptoms are improved. There is less cough and wheeze. Dyspnea is largely better.  No other issues noted CXR today is better   Asthma History    Asthma Control Assessment:    Age range: 12+ years    Symptoms: 0-2 days/week    Nighttime  Awakenings: 0-2/month    Interferes w/ normal activity: no limitations    SABA use (not for EIB): 0-2 days/week    ATAQ questionnaire: 0    FEV1: 2.16 liters (today)    Exacerbations requiring oral systemic steroids: 0-1/year    Asthma Control Assessment: Well Controlled  Preventive Screening-Counseling & Management  Alcohol-Tobacco     Smoking Status: never  Current Medications (verified): 1)  Advair Diskus 250-50 Mcg/dose Aepb (Fluticasone-Salmeterol) .... Inhale 1 Puff Two Times A Day 2)  Loratadine 10 Mg Tabs (Loratadine) .... Take 1 Tablet By Mouth Once A Day 3)  Cozaar 50 Mg Tabs (Losartan Potassium) .Marland Kitchen.. 1 Tablet  By Mouth Daily Daily 4)  Coumadin 5 Mg Tabs (Warfarin Sodium) .... Take As Directed 5)  Flonase 50 Mcg/act Susp (Fluticasone Propionate) .... Spray 1 Spray Into Both Nostrils Twice A Day 6)  Nexium 40 Mg Cpdr (Esomeprazole Magnesium) .... One By Mouth Daily 7)  Singulair 10 Mg Tabs (Montelukast Sodium) .... Take 1 Tablet By Mouth Once A Day Per Dr Delford Field 8)  Xopenex Hfa 45 Mcg/act Aero (Levalbuterol Tartrate) .... Inhale 2 Puff Using Inhaler Every Four Hours As Needed 9)  Tikosyn 250 Mcg Caps (Dofetilide) .Marland Kitchen.. 1 By Mouth Two Times A Day 10)  Cpap 11cmh20 Qhs 11)  Xopenex 0.63 Mg/63ml  Nebu (Levalbuterol  Hcl) .... One in Nebulizer Four Times Daily As Needed Per Dr Delford Field 12)  Pravastatin Sodium 20 Mg Tabs (Pravastatin Sodium) .Marland Kitchen.. 1 Once Daily 13)  Diabetic Shoes .... Wear Regularly 14)  Glucophage 500 Mg Tabs (Metformin Hcl) .Marland Kitchen.. 1 Tab By Mouth Two Times A Day For Diabetes 15)  Glucotrol 5 Mg Tabs (Glipizide) .... One Tablet By Mouth Twice A Day For Diabetes 16)  Diltiazem Hcl Er Beads 300 Mg Xr24h-Cap (Diltiazem Hcl Er Beads) .... Once Daily Per Dr Clarene Duke 17)  Furosemide 40 Mg Tabs (Furosemide) .... Take One Three Times Per Week As Needed Per Dr Clarene Duke 18)  Ferrous Sulfate 325 (65 Fe) Mg Tabs (Ferrous Sulfate) .Marland Kitchen.. 1 Tab By Mouth Two Times A Day For Iron  Deficiency 19)  Tessalon 200 Mg Caps (Benzonatate) .Marland Kitchen.. 1 By Mouth Three Times A Day As Needed Cough  Allergies (verified): 1)  Ace Inhibitors  Past History:  Past medical, surgical, family and social histories (including risk factors) reviewed, and no changes noted (except as noted below).  Past Medical History: Reviewed history from 09/28/2009 and no changes required. UPDATED 10/30/2008 after review of old paper chart dating back to late 1990s  H. pylori negative 07/2001, Menopause at 60 y/o, on CPAP at night, sinus bradycardia with arrythmia noted on 01/21/01  #DISEASE, VOCAL CORD NEC (ICD-478.5)............DR Delford Field >mentioned in chart > Was followed by Dr. Annalee Genta ENT in late 1990s - lary showed findings c/w GERD > Was also followed by Speech Clinic at [facility]. VCD suspected there. s/p speech therap in late 1990s  ACEi Intolerant OBESITY, NOS (ICD-278.00) LEG PAIN OR KNEE PAIN (ICD-729.5) HYPERTENSION, BENIGN SYSTEMIC (ICD-401.1) HYPERCHOLESTEROLEMIA (ICD-272.0) GASTROESOPHAGEAL REFLUX, NO ESOPHAGITIS (ICD-530.81) DIABETES MELLITUS II, UNCOMPLICATED (ICD-250.00) DEPRESSION, MAJOR, RECURRENT (ICD-296.30) ATRIAL FIBRILLATION (ICD-427.31)................[facility], Dr Clarene Duke >PAF. s/p ablation >s/p pulmonary vein isolation 11/08/08 >Followed by [facility] ASTHMA, UNSPECIFIED (ICD-493.90)....................DR Delford Field >charts mention "moderare persistent" severity.  >Last PFT was late 1990s per patient hx but in review  of paper chart Dr Marchelle Gearing could not find it july 2010 >Frequent exacerbagtions needing admits late 1990s >Positive skin testing for allergies - 1998 by Dr Sidney Ace >Normal spirometry on advair/singulair - july 2010 while symptomatic  #APNEA, SLEEP (ICD-780.57)....Dr. Vassie Loll  Past Surgical History: Reviewed history from 07/11/2009 and no changes required. Colonoscopy 06/2009: Pandiverticulosis. Dr Loreta Ave s/p pulmonary vein isolation 11/08/08 at  [facility] ABI = 1.99 - 03/31/2001,  Cardiac cath - 08/11/2005, Cardiac Cath- N Ef, mild CAD - 04/13/2002,  Cardiolite - scar at apical/lat wall, pos ischemia ant/anteroseptal wall - 07/20/2005,  Cholecystectomy -,  Flex sigmoid - nl - 06/02/2005,  Nissen Fundoplication 1/98 -,  R Breast Bx - sclerosing adenosis 5/93 -,  R Knee Arthroscopy - 10/12/1998, Sleep study -[facility] - 04/13/2004  Family History: Reviewed history from 06/10/2006 and no changes required. Father is deceased at 96 yo with DM and HTN., Mom died during a hysterectomy d/t a bleeding complication.  Social History: Reviewed history from 06/10/2006 and no changes required. Husband died December 12, 2022, lives w/ Daughter-Diana Kathan. Nonsmoker, doesn't drink.  Involved in non-denominational [employer]. Not working  Review of Systems       The patient complains of shortness of breath with activity.  The patient denies shortness of breath at rest, productive cough, non-productive cough, coughing up blood, chest pain, irregular heartbeats, acid heartburn, indigestion, loss of appetite, weight change, abdominal pain, difficulty swallowing, sore throat, tooth/dental problems, headaches, nasal congestion/difficulty breathing through nose, sneezing, itching, ear ache, anxiety, depression,  hand/feet swelling, joint stiffness or pain, rash, change in color of mucus, and fever.    Vital Signs:  Patient profile:   60 year old female Height:      66 inches Weight:      210.6 pounds BMI:     34.11 O2 Sat:      98 % on Room air Temp:     98.1 degrees F oral Pulse rate:   81 / minute BP sitting:   96 / 70  (left arm)  Vitals Entered By: Renold Genta RCP, LPN (January 28, 2010 9:40 AM)  O2 Sat at Rest %:  98% O2 Flow:  Room air CC: ,1 week follow up with CXR, prod cough white, states she feels better,less sob ,  compliant  with cpap averages 6 hrs per night  Comments Medications reviewed with patient Renold Genta RCP, LPN  January 28, 2010 9:41 AM     Physical Exam  Additional Exam:  GEN: A/Ox3; pleasant , NAD HEENT:  [place]/AT, , EACs-clear, TMs-wnl, NOSE-clear, THROAT-clear NECK:  Supple w/ fair ROM; no JVD; normal carotid impulses w/o bruits; no thyromegaly or nodules palpated; no lymphadenopathy. RESP  Coarse BS w/ no wheeizng noted.  CARD:  RRR, no m/r/g   GI:   Soft & nt; nml bowel sounds; no organomegaly or masses detected. [place]: Warm bil,  no calf tenderness edema, clubbing, pulses intact Neuro: intact w/ no focal deficitis detected.    Pre-Spirometry FEV1    Value: 2.16 L     Impression & Recommendations:  Problem # 1:  BRONCHIAL PNEUMONIA (ICD-485) Assessment Improved RLL PNA commmunity acqured improved plan no further ABX needed No change in inhaled medications.   Maintain treatment program as currently prescribed.  The following medications were removed from the medication list:    Avelox 400 Mg Tabs (Moxifloxacin hcl) .Marland Kitchen... 1 by mouth once daily  Orders: Est. Patient Level III (91478)  Complete Medication List: 1)  Advair Diskus 250-50 Mcg/dose Aepb (Fluticasone-salmeterol) .... Inhale 1 puff two times a day 2)  Loratadine 10 Mg Tabs (Loratadine) .... Take 1 tablet by mouth once a day 3)  Cozaar 50 Mg Tabs (Losartan potassium) .Marland Kitchen.. 1 tablet  by mouth daily daily 4)  Coumadin 5 Mg Tabs (Warfarin sodium) .... Take as directed 5)  Flonase 50 Mcg/act Susp (Fluticasone propionate) .... Spray 1 spray into both nostrils twice a day 6)  Nexium 40 Mg Cpdr (Esomeprazole magnesium) .... One by mouth daily 7)  Singulair 10 Mg Tabs (Montelukast sodium) .... Take 1 tablet by mouth once a day per dr Delford Field 8)  Tikosyn 250 Mcg Caps (Dofetilide) .Marland Kitchen.. 1 by mouth two times a day 9)  Cpap 11cmh20 Qhs  10)  Pravastatin Sodium 20 Mg Tabs (Pravastatin sodium) .Marland Kitchen.. 1 once daily 11)  Diabetic Shoes  .... Wear regularly 12)  Glucophage 500 Mg Tabs (Metformin hcl) .Marland Kitchen.. 1 tab by mouth two times a day for diabetes 13)  Glucotrol 5 Mg  Tabs (Glipizide) .... One tablet by mouth twice a day for diabetes 14)  Diltiazem Hcl Er Beads 300 Mg Xr24h-cap (Diltiazem hcl er beads) .... Once daily per dr little 15)  Ferrous Sulfate 325 (65 Fe) Mg Tabs (Ferrous sulfate) .Marland Kitchen.. 1 tab by mouth two times a day for iron deficiency 16)  Tessalon 200 Mg Caps (Benzonatate) .Marland Kitchen.. 1 by mouth three times a day as needed cough 17)  Xopenex Hfa 45 Mcg/act Aero (Levalbuterol tartrate) .... Inhale 2 puff using inhaler every  four hours as needed 18)  Furosemide 40 Mg Tabs (Furosemide) .... Take one three times per week as needed per dr little 19)  Xopenex 0.63 Mg/3ml Nebu (Levalbuterol hcl) .... One in nebulizer four times daily as needed per dr Delford Field   Patient Instructions: 1)  No change in medications 2)  Return in     2     months

## 2010-05-13 NOTE — Assessment & Plan Note (Signed)
Summary: discuss med side effects/[facility]   Vital Signs:  Patient profile:   60 year old female Height:      67 inches Weight:      215 pounds BMI:     33.80 Temp:     98.6 degrees F oral Pulse rate:   68 / minute BP sitting:   122 / 806  Vitals Entered By: Tessie Fass, CMA (May 15, 2009 10:20 AM) Is Patient Diabetic? Yes Pain Assessment Patient in pain? yes     Location: right thigh Intensity: 5   Primary Care Provider:  Cordera Stineman MD   History of Present Illness: 60 y/o F with DM, HTN, HLD, OA, Obesity here with   Leg pain: feels cramping.  worse at night.  sometimes walking helps cramping pain.  but burning pain is not decreased with walking.  On right leg pain starts in hip.  At night burning pain is from knees down.  Gabapentin 300mg  caused sedation all day long, even though she took it at night.    Habits & Providers  Alcohol-Tobacco-Diet     Tobacco Status: never  Allergies: 1)  Ace Inhibitors  Review of Systems MS:  Complains of joint pain; denies joint redness, joint swelling, and loss of strength.  Physical Exam  General:  Well-developed,well-nourished,in no acute distress; alert,appropriate and cooperative throughout examination. vitals reviewed Extremities:  no edema.  dorsalis pulses intact.  full rom of bilateral LE without pain.  no edema.  knees without crepitus or swelling.  strength intact.  passive leg lift without pain.     Impression & Recommendations:  Problem # 1:  LEG PAIN, BILATERAL (ICD-729.5) Assessment Unchanged Burning leg pain may be periph neuropathy.  Tried Gabapentin but pt complained of sommolence.  Will try nortriptylline 25 by mouth at bedtime.  Will get cpk today, LFT in 04/30/09 wnl.  Pt to rtc in 4-6 wks. Orders: CK (Creatine Kinase)-[facility] ([address]) [facility]- Est Level  3 (99213)  Complete Medication List: 1)  Advair Diskus 250-50 Mcg/dose Misc (Fluticasone-salmeterol) .... Inhale 1 puff as directed twice a day 2)  Allegra  180 Mg Tabs (Fexofenadine hcl) .... Take 1 tablet by mouth once a day 3)  Cozaar 50 Mg Tabs (Losartan potassium) .Marland Kitchen.. 1 tablet  by mouth daily daily 4)  Coumadin 5 Mg Tabs (Warfarin sodium) .... Take as directed 5)  Cartia Xt 300 Mg Xr24h-cap (diltiazem Hcl Coated Beads)  .Marland Kitchen.. 1 by mouth two times a day per dr little 6)  Flonase 50 Mcg/act Susp (Fluticasone propionate) .... Spray 1 spray into both nostrils twice a day 7)  Nexium 40 Mg Cpdr (Esomeprazole magnesium) .... One by mouth daily 8)  Singulair 10 Mg Tabs (Montelukast sodium) .... Take 1 tablet by mouth once a day per dr Delford Field 9)  Xopenex Hfa 45 Mcg/act Aero (Levalbuterol tartrate) .... Inhale 2 puff using inhaler every four hours per dr Delford Field 10)  Tikosyn Caps (dofetilide)  .... One by mouth two times a day per dr fitzgerald 11)  Cpap 11cmh20 Qhs  12)  Xopenex 0.63 Mg/69ml Nebu (Levalbuterol hcl) .... One in nebulizer four times daily as needed per dr Delford Field 13)  Simvastatin 20 Mg Tabs (Simvastatin) .Marland Kitchen.. 1 by mouth daily 14)  Diabetic Shoes  .... Wear regularly 15)  Glucophage 1000 Mg Tabs (Metformin hcl) .... One-half  tablet by mouth in am and full tablet by mouth in pm 16)  Glucotrol 5 Mg Tabs (Glipizide) .... One tablet by mouth twice  a day for diabetes 17)  Lasix 20 Mg  .Marland Kitchen.. 1 tab every other per dr little 18)  Diltiazem Hcl Er Beads 300 Mg Xr24h-cap (Diltiazem hcl er beads) .... Once daily 19)  Hydrochlorothiazide 25 Mg Tabs (Hydrochlorothiazide) .... 1/2 tab by mouth daily for blood pressure 20)  Nortriptyline Hcl 25 Mg Caps (Nortriptyline hcl) .Marland Kitchen.. 1 cap by mouth at bedtime  Patient Instructions: 1)  Please schedule a follow-up appointment in 1 month for leg pain.  2)  New medicine for leg pain: Nortriptyline 25mg  at bedtime.  Please let me know if you feel tired from this medication. Prescriptions: NORTRIPTYLINE HCL 25 MG CAPS (NORTRIPTYLINE HCL) 1 cap by mouth at bedtime  #34 x 3   Entered and Authorized by:   Angeline Slim MD   Signed by:   Angeline Slim MD on 05/15/2009   Method used:   [facility]  [address] [id number]       [address]       [place], [place]  [address]       Ph: [phone] or [phone]       Fax: [phone]   RxID:   [id number]

## 2010-05-13 NOTE — Consult Note (Signed)
Summary: [facility]  [facility]   Imported By: Knox Royalty 11/01/2009 12:09:45  _____________________________________________________________________  External Attachment:    Type:   Image     Comment:   External Document

## 2010-05-13 NOTE — Progress Notes (Signed)
Summary: returned call from dr Delford Field  Phone Note Call from Patient Call back at [facility] Phone [phone]   Caller: Patient Call For: [place] Summary of Call: pt returned call from dr Delford Field.  Initial call taken by: Tivis Ringer, CNA,  January 23, 2010 9:55 AM  Follow-up for Phone Call        Dr. Delford Field, did you call this pt?   Gweneth Dimitri RN  January 23, 2010 10:01 AM   Additional Follow-up for Phone Call Additional follow up Details #1::        done Additional Follow-up by: Storm Frisk MD,  January 23, 2010 10:06 AM

## 2010-05-13 NOTE — Assessment & Plan Note (Signed)
Summary: Acute NP office visit - asthma   Copy to:  Dr. Shan Levans Primary Provider/Referring Provider:  Cat Ta MD  CC:  tightness in chest, DOE, wheezing, and prod cough with white mucus - states is unchanged from 03-03-10 ov.  History of Present Illness: 60  year-old, African-American  female, history of moderate persistent asthma and reflux disease.  Hx of VCD and cyclic cough.  Hx GERD  October 15, 2009 10:00 AM The pt is better vs May OV with cough.  The  dyspnea is better and is sleeping better.  No new issues.   November 19, 2009--Presents for an acute office visit. Complains of wheezing, increased SOB, dry cough, chest congestion onset last night. Symptoms started last night, worse this am. Cough is dry, no discolored mucus. Cough is aggravating. Denies chest pain,  orthopnea, hemoptysis, fever, n/v/d, edema, headache.   December 09, 2009 9:20 AM The pt had another flare up of VCD and bronchitis on  11/19/09 and saw NP.   The pt was rx with  qvar and dexilant.  The pt is now is better.  The pt is holding advair at present.   January 21, 2010 --Presents for an acute office visit. Complains of  Increased sob and wheezing since last night. Complains she had trouble sleeping due to wheezing. Has on/off again wheezing at night for 3 weeks. Has occasional  productive  cough with thick white mucus. with  chest tightness and wheezing. Used  used neb 2 hrs ago. Was seen recently for arthritis flare and leg pain tx w/ steroids. She finished Prednisone 1 week ago for leg pain. Denies chest pain, orthopnea, hemoptysis, fever, n/v/d, edema, headache.   January 28, 2010 10:01 AM Pt feels better and had RLL PNA.  Pt rx with avelox x 7days and is now better. Symptoms are improved. There is less cough and wheeze. Dyspnea is largely better.  No other issues noted CXR today is better February 05, 2010 10:34 AM  The pt started wiht more cough and awakens from sleep dyspneic.    Notes some white and green  mucus.  No real sinus congestion Notes more dyspnea.  No fever except at night.  Notes sweats at night.  Sl heartburn into back and worse with coughing. The pt was better from RML PNA two weeks ago with CXR clearance ,  now worse.  February 14, 2010 11:31 AM cough is better.  no wheeze.  no sleep issues.  No discolored mucus.     March 03, 2010 --Presents for an acute office visit. Complains of prod cough with white mucus, wheezing, increased SOB, hoarseness, body aches/fatigues x1week - states she feels like she has never gotten back to baseline since dx'd with PNA 1 month ago. Last xray showed near complete resolution of RLL opacity.  Cough is minimally productive, no discoloration , no feer. Feels tired all the time. no energy. cough is not going away. Denies chest pain,  orthopnea, hemoptysis, fever, n/v/d, edema, headache, no weight loss.   March 19, 2010 --Returns with persistent cough and wheezing. She was tx w/ steroid taper and doxcycline.  Underwent CT chest for persisent RLL opacity, CT showed  Small area of clustered peribronchovascular nodularity in the right upper lobe may be infectious or post infectious in etiology.,   Right middle lobe scarring.  Returns today with no significant improvement.  cough meds help  but restart after it wears off. No discolored mucus, no fever.  Denies chest pain,  orthopnea, hemoptysis, fever, n/v/d, edema, headache.     Medications Prior to Update: 1)  Advair Diskus 250-50 Mcg/dose Aepb (Fluticasone-Salmeterol) .... Inhale 1 Puff Two Times A Day 2)  Loratadine 10 Mg Tabs (Loratadine) .... Take 1 Tablet By Mouth Once A Day 3)  Cozaar 50 Mg Tabs (Losartan Potassium) .Marland Kitchen.. 1 Tablet  By Mouth Daily Daily 4)  Coumadin 5 Mg Tabs (Warfarin Sodium) .... Take As Directed 5)  Flonase 50 Mcg/act Susp (Fluticasone Propionate) .... Spray 1 Spray Into Both Nostrils Twice A Day 6)  Nexium 40 Mg Cpdr (Esomeprazole Magnesium) .... One By Mouth Daily 7)   Singulair 10 Mg Tabs (Montelukast Sodium) .... Take 1 Tablet By Mouth Once A Day Per Dr Delford Field 8)  Tikosyn 250 Mcg Caps (Dofetilide) .Marland Kitchen.. 1 By Mouth Two Times A Day 9)  Cpap 11cmh20 .... Wear At Bedtime 10)  Pravastatin Sodium 20 Mg Tabs (Pravastatin Sodium) .Marland Kitchen.. 1 Once Daily 11)  Diabetic Shoes .... Wear Regularly 12)  Glucophage 500 Mg Tabs (Metformin Hcl) .Marland Kitchen.. 1 Tab By Mouth Two Times A Day For Diabetes 13)  Glucotrol 5 Mg Tabs (Glipizide) .... One Tablet By Mouth Twice A Day For Diabetes 14)  Diltiazem Hcl Er Beads 300 Mg Xr24h-Cap (Diltiazem Hcl Er Beads) .... Once Daily Per Dr Clarene Duke 15)  Ferrous Sulfate 325 (65 Fe) Mg Tabs (Ferrous Sulfate) .Marland Kitchen.. 1 Tab By Mouth Two Times A Day For Iron Deficiency 16)  Tessalon 200 Mg Caps (Benzonatate) .Marland Kitchen.. 1 By Mouth Three Times A Day As Needed Cough 17)  Xopenex Hfa 45 Mcg/act Aero (Levalbuterol Tartrate) .... Inhale 2 Puff Using Inhaler Every Four Hours As Needed 18)  Furosemide 40 Mg Tabs (Furosemide) .... Take One Three Times Per Week As Needed Per Dr Clarene Duke 19)  Xopenex 0.63 Mg/39ml  Nebu (Levalbuterol Hcl) .... One in Nebulizer Four Times Daily As Needed Per Dr Delford Field 20)  Prednisone 10 Mg Tabs (Prednisone) .... 4 Tabs For 3 Days, Then 3 Tabs For 3 Days, 2 Tabs For 3 Days, Then 1 Tab For 3 Days, Then Stop 21)  Hydromet 5-1.5 Mg/9ml Syrp (Hydrocodone-Homatropine) .Marland Kitchen.. 1-2 Tsp Every 4-6 Hr As Needed Cough  Current Medications (verified): 1)  Advair Diskus 250-50 Mcg/dose Aepb (Fluticasone-Salmeterol) .... Inhale 1 Puff Two Times A Day 2)  Loratadine 10 Mg Tabs (Loratadine) .... Take 1 Tablet By Mouth Once A Day 3)  Cozaar 50 Mg Tabs (Losartan Potassium) .Marland Kitchen.. 1 Tablet  By Mouth Daily Daily 4)  Coumadin 5 Mg Tabs (Warfarin Sodium) .... Take As Directed 5)  Flonase 50 Mcg/act Susp (Fluticasone Propionate) .... Spray 1 Spray Into Both Nostrils Twice A Day 6)  Nexium 40 Mg Cpdr (Esomeprazole Magnesium) .... One By Mouth Daily 7)  Singulair 10 Mg Tabs  (Montelukast Sodium) .... Take 1 Tablet By Mouth Once A Day Per Dr Delford Field 8)  Tikosyn 250 Mcg Caps (Dofetilide) .Marland Kitchen.. 1 By Mouth Two Times A Day 9)  Cpap 11cmh20 .... Wear At Bedtime 10)  Pravastatin Sodium 20 Mg Tabs (Pravastatin Sodium) .Marland Kitchen.. 1 Once Daily 11)  Diabetic Shoes .... Wear Regularly 12)  Glucophage 500 Mg Tabs (Metformin Hcl) .Marland Kitchen.. 1 Tab By Mouth Two Times A Day For Diabetes 13)  Glucotrol 5 Mg Tabs (Glipizide) .... One Tablet By Mouth Twice A Day For Diabetes 14)  Diltiazem Hcl Er Beads 300 Mg Xr24h-Cap (Diltiazem Hcl Er Beads) .... Once Daily Per Dr Clarene Duke 15)  Ferrous Sulfate 325 (65 Fe) Mg Tabs (  Ferrous Sulfate) .Marland Kitchen.. 1 Tab By Mouth Two Times A Day For Iron Deficiency 16)  Tessalon 200 Mg Caps (Benzonatate) .Marland Kitchen.. 1 By Mouth Three Times A Day As Needed Cough 17)  Xopenex Hfa 45 Mcg/act Aero (Levalbuterol Tartrate) .... Inhale 2 Puff Using Inhaler Every Four Hours As Needed 18)  Furosemide 40 Mg Tabs (Furosemide) .... Take One Three Times Per Week As Needed Per Dr Clarene Duke 19)  Xopenex 0.63 Mg/8ml  Nebu (Levalbuterol Hcl) .... One in Nebulizer Four Times Daily As Needed Per Dr Delford Field 20)  Hydromet 5-1.5 Mg/19ml Syrp (Hydrocodone-Homatropine) .Marland Kitchen.. 1-2 Tsp Every 4-6 Hr As Needed Cough  Allergies (verified): 1)  Ace Inhibitors  Past History:  Past Medical History: Last updated: 09/28/2009 UPDATED 10/30/2008 after review of old paper chart dating back to late 1990s  H. pylori negative 07/2001, Menopause at 60 y/o, on CPAP at night, sinus bradycardia with arrythmia noted on 01/21/01  #DISEASE, VOCAL CORD NEC (ICD-478.5)............DR Delford Field >mentioned in chart > Was followed by Dr. Annalee Genta ENT in late 1990s - lary showed findings c/w GERD > Was also followed by Speech Clinic at [facility]. VCD suspected there. s/p speech therap in late 1990s  ACEi Intolerant OBESITY, NOS (ICD-278.00) LEG PAIN OR KNEE PAIN (ICD-729.5) HYPERTENSION, BENIGN SYSTEMIC (ICD-401.1) HYPERCHOLESTEROLEMIA  (ICD-272.0) GASTROESOPHAGEAL REFLUX, NO ESOPHAGITIS (ICD-530.81) DIABETES MELLITUS II, UNCOMPLICATED (ICD-250.00) DEPRESSION, MAJOR, RECURRENT (ICD-296.30) ATRIAL FIBRILLATION (ICD-427.31)................[facility], Dr Clarene Duke >PAF. s/p ablation >s/p pulmonary vein isolation 11/08/08 >Followed by [facility] ASTHMA, UNSPECIFIED (ICD-493.90)....................DR Delford Field >charts mention "moderare persistent" severity.  >Last PFT was late 1990s per patient hx but in review  of paper chart Dr Marchelle Gearing could not find it july 2010 >Frequent exacerbagtions needing admits late 1990s >Positive skin testing for allergies - 1998 by Dr Sidney Ace >Normal spirometry on advair/singulair - july 2010 while symptomatic  #APNEA, SLEEP (ICD-780.57)....Dr. Vassie Loll  Past Surgical History: Last updated: 07/11/2009 Colonoscopy 06/2009: Pandiverticulosis. Dr Loreta Ave s/p pulmonary vein isolation 11/08/08 at [facility] ABI = 1.99 - 03/31/2001,  Cardiac cath - 08/11/2005, Cardiac Cath- N Ef, mild CAD - 04/13/2002,  Cardiolite - scar at apical/lat wall, pos ischemia ant/anteroseptal wall - 07/20/2005,  Cholecystectomy -,  Flex sigmoid - nl - 06/02/2005,  Nissen Fundoplication 1/98 -,  R Breast Bx - sclerosing adenosis 5/93 -,  R Knee Arthroscopy - 10/12/1998, Sleep study -[facility] - 04/13/2004  Family History: Last updated: 2006-06-22 Father is deceased at 90 yo with DM and HTN., Mom died during a hysterectomy d/t a bleeding complication.  Social History: Last updated: 22-Jun-2006 Husband died November 27, 2022, lives w/ Daughter-Diana Kempner. Nonsmoker, doesn't drink.  Involved in non-denominational [employer]. Not working  Risk Factors: Smoking Status: never (02/25/2010) Passive Smoke Exposure: no (02/05/2010)  Review of Systems      See HPI  Vital Signs:  Patient profile:   60 year old female Height:      66 inches Weight:      212.38 pounds BMI:     34.40 O2 Sat:      100 % on Room air Temp:     98.7 degrees F  oral Pulse rate:   87 / minute BP sitting:   104 / 70  (left arm) Cuff size:   large  Vitals Entered By: Boone Master CNA/MA (March 19, 2010 9:06 AM)  O2 Flow:  Room air CC: tightness in chest, DOE, wheezing, prod cough with white mucus - states is unchanged from 03-03-10 ov Is Patient Diabetic? Yes Comments Medications reviewed with patient Daytime  contact number verified with patient. Boone Master CNA/MA  March 19, 2010 9:06 AM    Physical Exam  Additional Exam:  GEN: A/Ox3; pleasant , NAD HEENT:  [place]/AT, , EACs-clear, TMs-wnl, NOSE-clear, THROAT-clear NECK:  Supple w/ fair ROM; no JVD; normal carotid impulses w/o bruits; no thyromegaly or nodules palpated; no lymphadenopathy. RESP  Coarse BS w/  upper airway psuedowheezing  CARD:  RRR, no m/r/g   GI:   Soft & nt; nml bowel sounds; no organomegaly or masses detected. [place]: Warm bil,  no calf tenderness edema, clubbing, pulses intact Neuro: intact w/ no focal deficitis detected.    Impression & Recommendations:  Problem # 1:  DISEASE, VOCAL CORD NEC (ICD-478.5)  Slow to resolve asthma flare w/ cyclical cough/vcd.  Plan:  she decline steroids.  Stop Advair.  Begin budesonide and brovana neb two times a day, brush/rinse/gargle.  Mucinex DM two times a day  Tessalon three times a day  Stop Loratdine.  Begin clortabs 4mg  every 4 hr as needed for drainge, throat clearing.(this is over the counter) Take Nexium in am before breakfast.  Add Dexilant 60mg  once daily before dinner   Add pepcid 20mg  at bedtime (this is over the counter )  Hydromet 1-2 tsp every 4-6 hr as needed cough,may make you sleepy.  GOAL IS TO STOP COUGHING , NO THROAT CLEARING.  SUGARLESS CANDY AND ICE CHIPS TO HELP WITH NO COUGHING.  follow up Dr. Delford Field in 2 WEEKS  and as needed  Please contact office for sooner follow up if symptoms do not improve or worsen   Orders: Est. Patient Level IV ([address])  Medications Added to Medication List This  Visit: 1)  Budesonide 0.25 Mg/73ml Susp (Budesonide) .Marland Kitchen.. 1 vial via hhn two times a day  brush/rinse/gargle after use 2)  Eq Chlortabs 4 Mg Tabs (Chlorpheniramine maleate) .Marland Kitchen.. 1 every 4 hrs as needed. 3)  Brovana 15 Mcg/55ml Nebu (Arformoterol tartrate) .Marland Kitchen.. 1 vial via hhn two times a day  Patient Instructions: 1)  Stop Advair.  2)  Begin budesonide and brovana neb two times a day, brush/rinse/gargle.  3)  Mucinex DM two times a day  4)  Tessalon three times a day  5)  Stop Loratdine.  6)  Begin clortabs 4mg  every 4 hr as needed for drainge, throat clearing.(this is over the counter) 7)  Take Nexium in am before breakfast.  8)  Add Dexilant 60mg  once daily before dinner   9)  Add pepcid 20mg  at bedtime (this is over the counter )  10)  Hydromet 1-2 tsp every 4-6 hr as needed cough,may make you sleepy.  11)  GOAL IS TO STOP COUGHING , NO THROAT CLEARING.  12)  SUGARLESS CANDY AND ICE CHIPS TO HELP WITH NO COUGHING.  13)  follow up Dr. Delford Field in 2 WEEKS  and as needed  14)  Please contact office for sooner follow up if symptoms do not improve or worsen  Prescriptions: BROVANA 15 MCG/2ML NEBU (ARFORMOTEROL TARTRATE) 1 vial via HHN two times a day  #60 x 5   Entered and Authorized by:   Rubye Oaks NP   Signed by:   Patrica Mendell NP on 03/19/2010   Method used:   [facility]  [address] [id number]       [address]       [place], [place]  [address]  Ph: [phone] or [phone]       Fax: [phone]   RxID:   [id number] BUDESONIDE 0.25 MG/2ML SUSP (BUDESONIDE) 1 vial via HHN two times a day  brush/rinse/gargle after use  #60 x 5   Entered and Authorized by:   Rubye Oaks NP   Signed by:   Adreona Brand NP on 03/19/2010   Method used:   [facility]  [address] [id number]       [address]       [place], [place]  [address]       Ph: [phone] or [phone]       Fax:  [phone]   RxID:   [id number] TESSALON 200 MG CAPS (BENZONATATE) 1 by mouth three times a day as needed cough  #45 x 1   Entered and Authorized by:   Rubye Oaks NP   Signed by:   Zyon Grout NP on 03/19/2010   Method used:   [facility]  [address] [id number]       [address]       [place], [place]  [address]       Ph: [phone] or [phone]       Fax: [phone]   RxID:   [id number] HYDROMET 5-1.5 MG/5ML SYRP (HYDROCODONE-HOMATROPINE) 1-2 tsp every 4-6 hr as needed cough  #8 oz x 0   Entered and Authorized by:   Rubye Oaks NP   Signed by:   Nikitha Mode NP on 03/19/2010   Method used:   Print then Give to Patient   RxID:   [id number]

## 2010-05-13 NOTE — Assessment & Plan Note (Signed)
Summary: leg pain,tcb   Vital Signs:  Patient profile:   60 year old female Height:      66 inches Weight:      212 pounds BMI:     34.34 O2 Sat:      99 % on Room air Temp:     98.5 degrees F oral Pulse rate:   109 / minute BP sitting:   113 / 76  (left arm) Cuff size:   large  Vitals Entered By: Jimmy Footman, CMA (January 10, 2010 11:14 AM)  O2 Flow:  Room air CC: right leg pain x3 days Is Patient Diabetic? Yes Did you bring your meter with you today? No Pain Assessment Patient in pain? yes     Location: right leg  Intensity: 10+ Type: sharp   Primary Care Provider:  Cat Ta MD  CC:  right leg pain x3 days.  History of Present Illness: right leg pain x 3 days.  Thinks she got a cortsone shot in her leg or arm for this before.  no new activities.  No swelling.  "hurt like the muscle deep inside" and points to thigh- lower leg normal.  Achy and burning  in character.  Worse with walking and up stairs.  Took tylenol without much help.  asthma:  1 week duration of cough, white mucous and dypnea when lays flat. more cough at night.  Season changes are usually a trigger.  No fever.  Usuing xopenex q 4 hours.  Not getting worse in the past week.  Habits & Providers  Alcohol-Tobacco-Diet     Tobacco Status: never  Allergies: 1)  Ace Inhibitors PMH-FH-SH reviewed for relevance  Review of Systems      See HPI  Physical Exam  General:  alert, well-developed, and well-nourished.   Lungs:  Normal respiratory effort, chest expands symmetrically. Breath sounds throughout- end expiratory wheezes all chest fields.  Coughing. Heart:  Normal rate and regular rhythm. S1 and S2 normal without gallop, murmur, click, rub or other extra sounds. Msk:  right leg without edema, erythema, rash.  negative straight leg raise, minimal pain on palpation of right thigh.  pain on palpation of trochanteric bursitis.  5/5 strenght bilaterally.   Impression & Recommendations:  Problem # 1:   LEG PAIN, BILATERAL (ICD-729.5) patient presented this is a new problem, but in fact, had steroid injection for trochanteric bursitis in July 2011.  She inquires about another injection.  Since I am prescribing steriods for asthma today, would not re-inject.  Will get xray to evaluate for osteoarthritis.  Given vicodin for acute pain.  If continues to have pain, would consdier another injection.  Also had history of myalgais and possible neuroapthy- I do not think these are playing into her hip pain at this time.  Orders: [facility]- Est  Level 4 (44010)  Problem # 2:  ASTHMA, UNSPECIFIED (ICD-493.90)  States she is compliant with meds as well as antireflux meds.  also has dx of vocal cord dyfunction.  Wheezing in lungs, commong trigger change in weather.  WIll prescribed prednisone x 5 days.  Advised to keep appt with Dr. Delford Field in 2 weeks.  Her updated medication list for this problem includes:    Advair Diskus 250-50 Mcg/dose Aepb (Fluticasone-salmeterol) ..... Inhale 1 puff two times a day    Singulair 10 Mg Tabs (Montelukast sodium) .Marland Kitchen... Take 1 tablet by mouth once a day per dr Delford Field    Xopenex Hfa 45 Mcg/act Aero (Levalbuterol tartrate) ..... Inhale  2 puff using inhaler every four hours as needed    Xopenex 0.63 Mg/61ml Nebu (Levalbuterol hcl) ..... One in nebulizer four times daily as needed per dr Delford Field    Prednisone 50 Mg Tabs (Prednisone) ..... One tab daily for 5 days  Pulmonary Functions Reviewed: O2 sat: 99 (01/10/2010)  Orders: [facility]- Est  Level 4 (46962)  Problem # 3:  ANEMIA, NORMOCYTIC (ICD-285.9) found by PCP.  Patient utd on colonoscopy, notes no bleeding or bruising.  No renal insufficiency, weight loss.  WIll draw repeat CBC and iron studies and patient will discuss results with PCP at appt next week.  Orders: CBC w/Diff-[facility] [phone]) Ferritin-[facility] [id number]) Iron -[facility] [phone]) Iron Binding Cap (TIBC)-[facility] ([id number]) Retic-[facility] ([id number]) [facility]- Est  Level 4  (88416)  Problem # 4:  MYALGIA (ICD-729.1) has been partially worked up before.  Given her symptom of proximal muscle weakness will check ck and esr to evaluate PMR..  ck in past has been normal.  CMET last week was normal.  Monitor response to steroids given for asthma-  Her updated medication list for this problem includes:    Vicodin 5-500 Mg Tabs (Hydrocodone-acetaminophen) ..... One tablet every 6 hours as needed for pain  Orders: CK (Creatine Kinase)-[facility] ([id number]) Sed Rate (ESR)-[facility] (85651) [facility]- Est  Level 4 (99214)  Complete Medication List: 1)  Advair Diskus 250-50 Mcg/dose Aepb (Fluticasone-salmeterol) .... Inhale 1 puff two times a day 2)  Loratadine 10 Mg Tabs (Loratadine) .... Take 1 tablet by mouth once a day 3)  Cozaar 50 Mg Tabs (Losartan potassium) .Marland Kitchen.. 1 tablet  by mouth daily daily 4)  Coumadin 5 Mg Tabs (Warfarin sodium) .... Take as directed 5)  Flonase 50 Mcg/act Susp (Fluticasone propionate) .... Spray 1 spray into both nostrils twice a day 6)  Nexium 40 Mg Cpdr (Esomeprazole magnesium) .... One by mouth daily 7)  Singulair 10 Mg Tabs (Montelukast sodium) .... Take 1 tablet by mouth once a day per dr Delford Field 8)  Xopenex Hfa 45 Mcg/act Aero (Levalbuterol tartrate) .... Inhale 2 puff using inhaler every four hours as needed 9)  Tikosyn Caps (dofetilide)  .... One by mouth two times a day per dr fitzgerald 10)  Cpap 11cmh20 Qhs  11)  Xopenex 0.63 Mg/94ml Nebu (Levalbuterol hcl) .... One in nebulizer four times daily as needed per dr Delford Field 12)  Pravastatin Sodium 20 Mg Tabs (Pravastatin sodium) .Marland Kitchen.. 1 once daily 13)  Diabetic Shoes  .... Wear regularly 14)  Glucophage 1000 Mg Tabs (Metformin hcl) .... One-half  tablet by mouth in am and full tablet by mouth in pm 15)  Glucotrol 5 Mg Tabs (Glipizide) .... One tablet by mouth twice a day for diabetes 16)  Diltiazem Hcl Er Beads 300 Mg Xr24h-cap (Diltiazem hcl er beads) .... Once daily per dr little 17)   Hydrochlorothiazide 25 Mg Tabs (Hydrochlorothiazide) .... 1/2 tab by mouth daily for blood pressure 18)  Furosemide 40 Mg Tabs (Furosemide) .... Take one three times per week as needed per dr little 19)  Lotrimin Af 1 % Crea (Clotrimazole) .... Apply to affected areas two times a day. dispense 30 grams. 20)  Prednisone 50 Mg Tabs (Prednisone) .... One tab daily for 5 days 21)  Vicodin 5-500 Mg Tabs (Hydrocodone-acetaminophen) .... One tablet every 6 hours as needed for pain  Other Orders: Diagnostic X-Ray/Fluoroscopy (Diagnostic X-Ray/Flu)  Patient Instructions: 1)  I'm going to get an xray to look at yoru hip 2)  Going to get labs to  help Dr. Janalyn Harder figure out why you have anemia and muscle pain 3)  keep follow-up appt with Dr. Delford Field and Ta 4)  Prednisone for 5 days will help with breathing and aches and pains 5)  hydrocodone only for limited time use for severe pain.  Do not take more than 3000 mg of tylenol a day Prescriptions: VICODIN 5-500 MG TABS (HYDROCODONE-ACETAMINOPHEN) one tablet every 6 hours as needed for pain  #20 x 0   Entered and Authorized by:   Delbert Harness MD   Signed by:   Delbert Harness MD on 01/10/2010   Method used:   Print then Give to Patient   RxID:   [id number] PREDNISONE 50 MG TABS (PREDNISONE) one tab daily for 5 days  #5 x 0   Entered and Authorized by:   Delbert Harness MD   Signed by:   Delbert Harness MD on 01/10/2010   Method used:   Electronically to        [facility]  [address] [id number] (retail)       [address]       [place], [place]  [address]       Ph: [phone] or [phone]       Fax: [phone]   RxID:   [id number]   Appended Document: Sedrate   70 mm/hr     Lab Visit  Laboratory Results   Blood Tests   Date/Time Received: January 10, 2010 12:01 PM  Date/Time Reported: January 10, 2010 3:55 PM   SED rate: 70  mm/hr  Comments: ...............test performed by......Marland KitchenBonnie A. [place], MLS  (ASCP)cm    Orders Today: ESR noted. Pt has 1appt tomorrow with PCP.  WIll fwd lab to her and clinically correlate with symptoms. Delbert Harness MD  January 13, 2010 8:33 AM

## 2010-05-13 NOTE — Consult Note (Signed)
Summary: [facility]  [facility]   Imported By: Clydell Hakim 06/14/2009 16:04:32  _____________________________________________________________________  External Attachment:    Type:   Image     Comment:   External Document  Appended Document: [facility] Medications Added * LASIX 40 MG 1 tab 3 times per week Dr Clarene Duke          Clinical Lists Changes  Medications: Changed medication from * LASIX 20 MG 1 tab every other per Dr Clarene Duke to * LASIX 40 MG 1 tab 3 times per week Dr Clarene Duke

## 2010-05-13 NOTE — Assessment & Plan Note (Signed)
Summary: leg cramps/[place]/ta   Vital Signs:  Patient profile:   60 year old female Weight:      212.8 pounds Pulse rate:   81 / minute BP sitting:   120 / 78  (right arm) Cuff size:   regular  Vitals Entered By: Arlyss Repress CMA, (October 22, 2009 3:12 PM) CC: right leg pain and cramps upper leg. pain moved from knee up. no injury. Is Patient Diabetic? Yes Pain Assessment Patient in pain? yes     Location: right leg Intensity: 8 Onset of pain  x 4 days.   Primary Provider:  Cat Ta MD  CC:  right leg pain and cramps upper leg. pain moved from knee up. no injury.Marland Kitchen  History of Present Illness: Pain in upper leg with cramps:  Pain for 4-5 days on R side.  Describes pain as sharp pain that radiates down lateral side of leg. Pain does not radiate past knee. She points to one are of greates pain in upper lateral leg.  States pain is worse at night when she tries to sleep on her right side.  She has had trouble walking when the pain is at its worst and had to crawl up her steps to bed last pm.  She does not know of any acute trauma to the area and she has started no new exercise regimen or activities.  She has tried taking tylenol which helped some.  She has not tried anything else for the pain, and she states that she can not take nsaids due to other medications.  She denies lower back pain, loss of bladder of bowel function, numbness, tingling.  Habits & Providers  Alcohol-Tobacco-Diet     Tobacco Status: never     Passive Smoke Exposure: no  Current Medications (verified): 1)  Advair Diskus 250-50 Mcg/dose Aepb (Fluticasone-Salmeterol) .... Inhale 1 Puff Two Times A Day 2)  Allegra 180 Mg Tabs (Fexofenadine Hcl) .... Take 1 Tablet By Mouth Once A Day 3)  Cozaar 50 Mg Tabs (Losartan Potassium) .Marland Kitchen.. 1 Tablet  By Mouth Daily Daily 4)  Coumadin 5 Mg Tabs (Warfarin Sodium) .... Take As Directed 5)  Flonase 50 Mcg/act Susp (Fluticasone Propionate) .... Spray 1 Spray Into Both Nostrils Twice A  Day 6)  Nexium 40 Mg Cpdr (Esomeprazole Magnesium) .... One By Mouth Daily 7)  Singulair 10 Mg Tabs (Montelukast Sodium) .... Take 1 Tablet By Mouth Once A Day Per Dr Delford Field 8)  Xopenex Hfa 45 Mcg/act Aero (Levalbuterol Tartrate) .... Inhale 2 Puff Using Inhaler Every Four Hours As Needed 9)  Tikosyn  Caps (Dofetilide) .... One By Mouth Two Times A Day Per Dr Sampson Goon 10)  Cpap 11cmh20 Qhs 11)  Xopenex 0.63 Mg/51ml  Nebu (Levalbuterol Hcl) .... One in Nebulizer Four Times Daily As Needed Per Dr Delford Field 12)  Pravastatin Sodium 20 Mg Tabs (Pravastatin Sodium) .Marland Kitchen.. 1 Once Daily 13)  Diabetic Shoes .... Wear Regularly 14)  Glucophage 1000 Mg Tabs (Metformin Hcl) .... One-Half  Tablet By Mouth in Am and Full Tablet By Mouth in Pm 15)  Glucotrol 5 Mg Tabs (Glipizide) .... One Tablet By Mouth Twice A Day For Diabetes 16)  Diltiazem Hcl Er Beads 300 Mg Xr24h-Cap (Diltiazem Hcl Er Beads) .... Once Daily 17)  Hydrochlorothiazide 25 Mg Tabs (Hydrochlorothiazide) .... 1/2 Tab By Mouth Daily For Blood Pressure 18)  Furosemide 40 Mg Tabs (Furosemide) .... Take One Three Times A Day As Needed  Allergies (verified): 1)  Ace Inhibitors  Social History: Passive Smoke Exposure:  no  Review of Systems       Pertinent positives and negatives noted in HPI, Vitals signs noted   Physical Exam  General:  alert, well-developed, and well-nourished.   Msk:  Decreased ROM in Right hip.  Positive FABER test on R with minimal flexion and abduction.  Point tenderness with palpation over R trochanter.  SLR (-) bilaterally.  Antalgic gait, with favoring of R side.   Impression & Recommendations:  Problem # 1:  TROCHANTERIC BURSITIS, RIGHT (ICD-726.5) Pt. with tenderness over trochanteric bursa along with pain when sleeping on right side consistent with bursitis.  Injected today with kenalog.  If not improving within a few days, instructed her to come back in for re-evaluation.  Orders: [facility]- Est Level  3  (19147) Injection, large joint- [facility] (82956) Triamcinolone (Kenalog) 10mg  (J3301)  Complete Medication List: 1)  Advair Diskus 250-50 Mcg/dose Aepb (Fluticasone-salmeterol) .... Inhale 1 puff two times a day 2)  Allegra 180 Mg Tabs (Fexofenadine hcl) .... Take 1 tablet by mouth once a day 3)  Cozaar 50 Mg Tabs (Losartan potassium) .Marland Kitchen.. 1 tablet  by mouth daily daily 4)  Coumadin 5 Mg Tabs (Warfarin sodium) .... Take as directed 5)  Flonase 50 Mcg/act Susp (Fluticasone propionate) .... Spray 1 spray into both nostrils twice a day 6)  Nexium 40 Mg Cpdr (Esomeprazole magnesium) .... One by mouth daily 7)  Singulair 10 Mg Tabs (Montelukast sodium) .... Take 1 tablet by mouth once a day per dr Delford Field 8)  Xopenex Hfa 45 Mcg/act Aero (Levalbuterol tartrate) .... Inhale 2 puff using inhaler every four hours as needed 9)  Tikosyn Caps (dofetilide)  .... One by mouth two times a day per dr fitzgerald 10)  Cpap 11cmh20 Qhs  11)  Xopenex 0.63 Mg/17ml Nebu (Levalbuterol hcl) .... One in nebulizer four times daily as needed per dr Delford Field 12)  Pravastatin Sodium 20 Mg Tabs (Pravastatin sodium) .Marland Kitchen.. 1 once daily 13)  Diabetic Shoes  .... Wear regularly 14)  Glucophage 1000 Mg Tabs (Metformin hcl) .... One-half  tablet by mouth in am and full tablet by mouth in pm 15)  Glucotrol 5 Mg Tabs (Glipizide) .... One tablet by mouth twice a day for diabetes 16)  Diltiazem Hcl Er Beads 300 Mg Xr24h-cap (Diltiazem hcl er beads) .... Once daily 17)  Hydrochlorothiazide 25 Mg Tabs (Hydrochlorothiazide) .... 1/2 tab by mouth daily for blood pressure 18)  Furosemide 40 Mg Tabs (Furosemide) .... Take one three times a day as needed  Patient Instructions: 1)  If you are having pain in the area of injection, use ice on this area.  Alternate 15 min on, 15 min off for an hour. 2)  If your hip is still bothering you please call back to be seen again.  Appended Document: leg cramps/[place]/ta Procedure Note Trochanteric  Bursa Injection: Time out performed, area identified Area palpated for area of maximum tenderness Area prepped in typical fashion using betadine swab Ethyl chloride spray applied for anesthesia Area injected with lidocaine/kenalog  Pressure held to area, and band aid applied There were no complications, pt. tolerated procedure well, no blood loss.

## 2010-05-13 NOTE — Consult Note (Signed)
Summary: [facility]  [facility]   Imported By: Bradly Bienenstock 07/16/2009 16:59:22  _____________________________________________________________________  External Attachment:    Type:   Image     Comment:   External Document

## 2010-05-13 NOTE — Assessment & Plan Note (Signed)
Summary: Pulmonary OV   Copy to:  Dr. Shan Levans Primary Provider/Referring Provider:  Cat Ta MD  CC:  Acute visit.  Pt c/o increased SOB with exertion x 2 days.  Also feels like she can't breathe when lies down.  She also c/o wheezing and chest tightness.  She denies any cough..  History of Present Illness: 60  year-old, African-American  female, history of moderate persistent asthma and reflux disease.  Hx of VCD and cyclic cough.  Hx GERD  Sep 03, 2009 10:18 AM Started cough and wheezing for 24hrs.  Now awakening from sleep and coughing.  Cough is dry.  Notes some pndrip.  Notes more heartburn and indigestion. Notes more burning across the back and front.   Asthma History    Asthma Control Assessment:    Age range: 12+ years    Symptoms: throughout the day    Nighttime Awakenings: 4 or more/week    Interferes w/ normal activity: some limitations    SABA use (not for EIB): several times per day    ATAQ questionnaire: 3-4    FEV1: 2.16 liters (today)    Exacerbations requiring oral systemic steroids: 0-1/year    Asthma Control Assessment: Very Poorly Controlled  Dyspepsia History:      There is a prior history of GERD.      Preventive Screening-Counseling & Management  Alcohol-Tobacco     Smoking Status: never  Current Medications (verified): 1)  Advair Diskus 250-50 Mcg/dose Misc (Fluticasone-Salmeterol) .... Inhale 1 Puff As Directed Twice A Day 2)  Allegra 180 Mg Tabs (Fexofenadine Hcl) .... Take 1 Tablet By Mouth Once A Day 3)  Cozaar 50 Mg Tabs (Losartan Potassium) .Marland Kitchen.. 1 Tablet  By Mouth Daily Daily 4)  Coumadin 5 Mg Tabs (Warfarin Sodium) .... Take As Directed 5)  Flonase 50 Mcg/act Susp (Fluticasone Propionate) .... Spray 1 Spray Into Both Nostrils Twice A Day 6)  Nexium 40 Mg Cpdr (Esomeprazole Magnesium) .... One By Mouth Daily 7)  Singulair 10 Mg Tabs (Montelukast Sodium) .... Take 1 Tablet By Mouth Once A Day Per Dr Delford Field 8)  Xopenex Hfa 45 Mcg/act  Aero (Levalbuterol Tartrate) .... Inhale 2 Puff Using Inhaler Every Four Hours Per Dr Delford Field 9)  Tikosyn  Caps (Dofetilide) .... One By Mouth Two Times A Day Per Dr Sampson Goon 10)  Cpap 11cmh20 Qhs 11)  Xopenex 0.63 Mg/109ml  Nebu (Levalbuterol Hcl) .... One in Nebulizer Four Times Daily As Needed Per Dr Delford Field 12)  Pravastatin Sodium 20 Mg Tabs (Pravastatin Sodium) .Marland Kitchen.. 1 Once Daily 13)  Diabetic Shoes .... Wear Regularly 14)  Glucophage 1000 Mg Tabs (Metformin Hcl) .... One-Half  Tablet By Mouth in Am and Full Tablet By Mouth in Pm 15)  Glucotrol 5 Mg Tabs (Glipizide) .... One Tablet By Mouth Twice A Day For Diabetes 16)  Lasix 40 Mg .Marland Kitchen.. 1 Tab 3 Times Per Week Dr Clarene Duke 17)  Diltiazem Hcl Er Beads 300 Mg Xr24h-Cap (Diltiazem Hcl Er Beads) .... Once Daily 18)  Hydrochlorothiazide 25 Mg Tabs (Hydrochlorothiazide) .... 1/2 Tab By Mouth Daily For Blood Pressure 19)  Furosemide 40 Mg Tabs (Furosemide) .... Take One Three Times A Day As Needed  Allergies (verified): 1)  Ace Inhibitors  Past History:  Past medical, surgical, family and social histories (including risk factors) reviewed, and no changes noted (except as noted below).  Past Medical History: Reviewed history from 04/08/2009 and no changes required. UPDATED 10/30/2008 after review of old paper chart dating back  to late 1990s  H. pylori negative 07/2001, Menopause at 60 y/o, on CPAP at night, sinus bradycardia with arrythmia noted on 01/21/01  #DISEASE, VOCAL CORD NEC (ICD-478.5)............DR Delford Field >mentioned in chart > Was followed by Dr. Annalee Genta ENT in late 1990s - lary showed findings c/w GERD > Was also followed by [facility]. VCD suspected there. s/p speech therap in late 1990s  ACEi Intolerant OBESITY, NOS (ICD-278.00) LEG PAIN OR KNEE PAIN (ICD-729.5) HYPERTENSION, BENIGN SYSTEMIC (ICD-401.1) HYPERCHOLESTEROLEMIA (ICD-272.0) GASTROESOPHAGEAL REFLUX, NO ESOPHAGITIS (ICD-530.81) DIABETES MELLITUS II,  UNCOMPLICATED (ICD-250.00) DEPRESSION, MAJOR, RECURRENT (ICD-296.30) ATRIAL FIBRILLATION (ICD-427.31)................[facility] Dr Norton Blizzard >PAF. s/p ablation >s/p pulmonary vein isolation 11/08/08 ASTHMA, UNSPECIFIED (ICD-493.90)....................DR Delford Field >charts mention "moderare persistent" severity.  >Last PFT was late 1990s per patient hx but in review  of paper chart Dr Marchelle Gearing could not find it july 2010 >Frequent exacerbagtions needing admits late 1990s >Positive skin testing for allergies - 1998 by Dr Sidney Ace >Normal spirometry on advair/singulair - july 2010 while symptomatic  #APNEA, SLEEP (ICD-780.57)....Dr. Vassie Loll  Past Surgical History: Reviewed history from 07/11/2009 and no changes required. Colonoscopy 06/2009: Pandiverticulosis. Dr Loreta Ave s/p pulmonary vein isolation 11/08/08 at [facility] ABI = 1.99 - 03/31/2001,  Cardiac cath - 08/11/2005, Cardiac Cath- N Ef, mild CAD - 04/13/2002,  Cardiolite - scar at apical/lat wall, pos ischemia ant/anteroseptal wall - 07/20/2005,  Cholecystectomy -,  Flex sigmoid - nl - 06/02/2005,  Nissen Fundoplication 1/98 -,  R Breast Bx - sclerosing adenosis 5/93 -,  R Knee Arthroscopy - 10/12/1998, Sleep study -[facility] - 04/13/2004  Family History: Reviewed history from 06/10/2006 and no changes required. Father is deceased at 76 yo with DM and HTN., Mom died during a hysterectomy d/t a bleeding complication.  Social History: Reviewed history from 06/10/2006 and no changes required. Husband died 12-01-2022, lives w/ Daughter-Diana Farrier. Nonsmoker, doesn't drink.  Involved in non-denominational [employer]. Not working  Review of Systems       The patient complains of shortness of breath with activity, shortness of breath at rest, productive cough, non-productive cough, acid heartburn, and indigestion.  The patient denies coughing up blood, chest pain, irregular heartbeats, loss of appetite, weight change, abdominal pain, difficulty swallowing, sore  throat, tooth/dental problems, headaches, nasal congestion/difficulty breathing through nose, sneezing, itching, ear ache, anxiety, depression, hand/feet swelling, joint stiffness or pain, rash, change in color of mucus, and fever.    Vital Signs:  Patient profile:   61 year old female Weight:      217 pounds O2 Sat:      98 % on Room air Temp:     98.2 degrees F oral Pulse rate:   70 / minute BP sitting:   110 / 78  (left arm) Cuff size:   large  Vitals Entered By: Vernie Murders (Sep 03, 2009 10:15 AM)  O2 Flow:  Room air  Physical Exam  Additional Exam:  Gen: Pleasant, well-nourished, in no distress , normal affect ENT: mild nasal purulence Neck: No JVD, no TMG, no carotid bruits Lungs: No use of accessory muscles, no dullness to percussion, worsening pseudowheeze Cardiovascular: RRR, heart sounds normal, no murmurs or gallops, no peripheral edema Musculoskeletal: No deformities, no cyanosis or clubbing      Pre-Spirometry FEV1    Value: 2.16 L     Impression & Recommendations:  Problem # 1:  ASTHMA, UNSPECIFIED (ICD-493.90) Assessment Deteriorated  worsening moderate persistent asthma with ppt factors in the past to include GERD and now acute bronchitis with  associated vocal cord dysfunction and mild early sinusitis, advair will aggravate upper airway in this situation  plan Stop Advair Start Qvar two puff twice daily Prednisone 10mg  4 each am x3days, 3 x 3days, 2 x 3days, 1 x 3days then stop Tussionex as needed for cough Use Dexilant one daily until samples gone then resume nexium, hold nexium while on dexilant Follow Reflux diet Return 6 weeks  Problem # 2:  GASTROESOPHAGEAL REFLUX, NO ESOPHAGITIS (ICD-530.81) Assessment: Deteriorated see assessment number one  Her updated medication list for this problem includes:    Nexium 40 Mg Cpdr (Esomeprazole magnesium) ..... One by mouth daily  Orders: Est. Patient Level V (03474)  Medications Added to Medication  List This Visit: 1)  Qvar 40 Mcg/act Aers (Beclomethasone dipropionate) .... Two puffs twice daily 2)  Pravastatin Sodium 20 Mg Tabs (Pravastatin sodium) .Marland Kitchen.. 1 once daily 3)  Prednisone 10 Mg Tabs (Prednisone) .... Take as directed 4 each am x3days, 3 x 3days, 2 x 3days, 1 x 3days then stop 4)  Tussionex Pennkinetic Er 8-10 Mg/36ml Lqcr (Chlorpheniramine-hydrocodone) .... 5 cc by mouth twice daily  Complete Medication List: 1)  Qvar 40 Mcg/act Aers (Beclomethasone dipropionate) .... Two puffs twice daily 2)  Allegra 180 Mg Tabs (Fexofenadine hcl) .... Take 1 tablet by mouth once a day 3)  Cozaar 50 Mg Tabs (Losartan potassium) .Marland Kitchen.. 1 tablet  by mouth daily daily 4)  Coumadin 5 Mg Tabs (Warfarin sodium) .... Take as directed 5)  Flonase 50 Mcg/act Susp (Fluticasone propionate) .... Spray 1 spray into both nostrils twice a day 6)  Nexium 40 Mg Cpdr (Esomeprazole magnesium) .... One by mouth daily 7)  Singulair 10 Mg Tabs (Montelukast sodium) .... Take 1 tablet by mouth once a day per dr Delford Field 8)  Xopenex Hfa 45 Mcg/act Aero (Levalbuterol tartrate) .... Inhale 2 puff using inhaler every four hours per dr Delford Field 9)  Tikosyn Caps (dofetilide)  .... One by mouth two times a day per dr fitzgerald 10)  Cpap 11cmh20 Qhs  11)  Xopenex 0.63 Mg/32ml Nebu (Levalbuterol hcl) .... One in nebulizer four times daily as needed per dr Delford Field 12)  Pravastatin Sodium 20 Mg Tabs (Pravastatin sodium) .Marland Kitchen.. 1 once daily 13)  Diabetic Shoes  .... Wear regularly 14)  Glucophage 1000 Mg Tabs (Metformin hcl) .... One-half  tablet by mouth in am and full tablet by mouth in pm 15)  Glucotrol 5 Mg Tabs (Glipizide) .... One tablet by mouth twice a day for diabetes 16)  Lasix 40 Mg  .Marland Kitchen.. 1 tab 3 times per week dr little 17)  Diltiazem Hcl Er Beads 300 Mg Xr24h-cap (Diltiazem hcl er beads) .... Once daily 18)  Hydrochlorothiazide 25 Mg Tabs (Hydrochlorothiazide) .... 1/2 tab by mouth daily for blood pressure 19)   Furosemide 40 Mg Tabs (Furosemide) .... Take one three times a day as needed 20)  Prednisone 10 Mg Tabs (Prednisone) .... Take as directed 4 each am x3days, 3 x 3days, 2 x 3days, 1 x 3days then stop 21)  Tussionex Pennkinetic Er 8-10 Mg/19ml Lqcr (Chlorpheniramine-hydrocodone) .... 5 cc by mouth twice daily  Other Orders: Prescription Created Electronically [id number])  Patient Instructions: 1)  Stop Advair 2)  Start Qvar two puff twice daily 3)  Prednisone 10mg  4 each am x3days, 3 x 3days, 2 x 3days, 1 x 3days then stop 4)  Tussionex as needed for cough 5)  Use Dexilant one daily until samples gone then resume nexium, hold nexium while  on dexilant 6)  Follow Reflux diet 7)  Return 6 weeks Prescriptions: TUSSIONEX PENNKINETIC ER 8-10 MG/5ML  LQCR (CHLORPHENIRAMINE-HYDROCODONE) 5 cc by mouth twice daily  #18ml x 0   Entered and Authorized by:   Storm Frisk MD   Signed by:   Storm Frisk MD on 09/03/2009   Method used:   Print then Give to Patient   RxID:   [id number] QVAR 40 MCG/ACT  AERS (BECLOMETHASONE DIPROPIONATE) Two puffs twice daily  #1 x 6   Entered and Authorized by:   Storm Frisk MD   Signed by:   Storm Frisk MD on 09/03/2009   Method used:   Electronically to        [facility]  [address] [id number] (retail)       [address]       [place], [place]  [address]       Ph: [phone] or [phone]       Fax: [phone]   RxID:   [id number] PREDNISONE 10 MG  TABS (PREDNISONE) Take as directed 4 each am x3days, 3 x 3days, 2 x 3days, 1 x 3days then stop  #30 x 0   Entered and Authorized by:   Storm Frisk MD   Signed by:   Storm Frisk MD on 09/03/2009   Method used:   Electronically to        [facility]  [address] [id number] (retail)       [address]       [place], [place]  [address]       Ph: [phone] or [phone]       Fax: [phone]   RxID:   [id number]

## 2010-05-15 NOTE — Progress Notes (Signed)
Summary:  cant sleep, cant eat, tired  Phone Note Call from Patient Call back at [facility] Phone [phone]   Caller: Patient Call For: Dr. Delford Field Summary of Call: Patient phoned stated that she has some issues. She is having problems she isn't sleeping, she is just tired, when she eats it goes staight thru her, her nerves are so bad that she can't do anything other than get up in the morning. She can be reached [phone] Initial call taken by: Vedia Coffer,  April 22, 2010 8:44 AM  Follow-up for Phone Call        Spoke with pt.  She is c/o feeling very nervous and unable to sleep for the past 5 days.  She states that her breathing is fine and cough is improved.  I advised that she needs to call and sched appt with her PCP to disucss these problems she is having and call us for changes in breathing/cough.  Pt states will call PCP today. Follow-up by: Vernie Murders,  April 22, 2010 9:07 AM

## 2010-05-15 NOTE — Assessment & Plan Note (Signed)
Summary: Hosp fu/?edema/kf   Vital Signs:  Patient profile:   60 year old female Height:      66 inches Weight:      226.3 pounds BMI:     36.66 O2 Sat:      99 % on Room air Temp:     98.4 degrees F oral Pulse rate:   60 / minute BP sitting:   106 / 74  (right arm) Cuff size:   regular  Vitals Entered By: Jimmy Footman, CMA (May 09, 2010 2:49 PM) CC: hospital follow up Is Patient Diabetic? Yes Did you bring your meter with you today? No Pain Assessment Patient in pain? yes        Referring Provider:  Dr. Shan Levans Primary Provider:  Cat Ta MD  CC:  hospital follow up.  History of Present Illness: Pt is a 60 year old female with nonischemic cardiomyopathy and CHF who was recently hospitalized for a CHF exacerbation and discharged yesterday who presents with a 6 lb weight gain overnight, dizzyness, and halucinations.  She began having halucinations last night that were described by her daughter as talking to people who weren't present and becoming quite agitated.  She was also not able to sleep.  Her home health nurse came to see her today, she was noted to have gained six pounds since the day before and was having worse problems with dizzyness, shortness of breath at night, and heavy legs.  She also describes continued problems with feeling the need to vomit constantly.  Of note, in her recent hospitalization, she has had problems with hypotension when on Lasix, and was discharged only on spironolactone 12.5mg  by mouth daily.  Current Medications (verified): 1)  Budesonide 0.25 Mg/25ml Susp (Budesonide) .Marland Kitchen.. 1 Vial Via [facility] Two Times A Day  Brush/rinse/gargle After Use 2)  Cozaar 50 Mg Tabs (Losartan Potassium) .Marland Kitchen.. 1 Tablet  By Mouth Daily Daily 3)  Coumadin 5 Mg Tabs (Warfarin Sodium) .... Take As Directed 4)  Flonase 50 Mcg/act Susp (Fluticasone Propionate) .... Spray 1 Spray Into Both Nostrils Twice A Day 5)  Nexium 40 Mg Cpdr (Esomeprazole Magnesium) .... One By  Mouth Daily 6)  Cpap 11cmh20 .... Wear At Bedtime 7)  Pravastatin Sodium 20 Mg Tabs (Pravastatin Sodium) .Marland Kitchen.. 1 Once Daily 8)  Diabetic Shoes .... Wear Regularly 9)  Glucotrol 5 Mg Tabs (Glipizide) .... One Tablet By Mouth Twice A Day For Diabetes 10)  Ferrous Sulfate 325 (65 Fe) Mg Tabs (Ferrous Sulfate) .Marland Kitchen.. 1 Tab By Mouth Two Times A Day For Iron Deficiency 11)  Xopenex Hfa 45 Mcg/act Aero (Levalbuterol Tartrate) .... Inhale 2 Puff Using Inhaler Every Four Hours As Needed 12)  Amiodarone Hcl 400 Mg Tabs (Amiodarone Hcl) .... Take One Two Times A Day Until Jan 31, Then 1/2 Two Times A Day 13)  Wellbutrin Sr 150 Mg Xr12h-Tab (Bupropion Hcl) .... Take One Every Morning For Mood 14)  Lantus 100 Unit/ml Soln (Insulin Glargine) .Marland Kitchen.. 15 Units Subq At Bedtime 15)  Metoprolol Tartrate 100 Mg Tabs (Metoprolol Tartrate) .... One Bid 16)  Singulair 10 Mg Tabs (Montelukast Sodium) .... One Daily 17)  Miralax  Powd (Polyethylene Glycol 3350) .Marland Kitchen.. 17gm Daily 18)  Promethazine Hcl 12.5 Mg Tabs (Promethazine Hcl) .Marland Kitchen.. 1-2 Tablets Q4h As Needed Nausea 19)  Spironolactone 25 Mg Tabs (Spironolactone) .... 1/2 Tablet Daily  Allergies (verified): 1)  Ace Inhibitors  Past History:  Past medical, surgical, family and social histories (including risk factors) reviewed, and  no changes noted (except as noted below).  Past Medical History: Reviewed history from 09/28/2009 and no changes required. UPDATED 10/30/2008 after review of old paper chart dating back to late 1990s  H. pylori negative 07/2001, Menopause at 60 y/o, on CPAP at night, sinus bradycardia with arrythmia noted on 01/21/01  #DISEASE, VOCAL CORD NEC (ICD-478.5)............DR Delford Field >mentioned in chart > Was followed by Dr. Annalee Genta ENT in late 1990s - lary showed findings c/w GERD > Was also followed by Speech Clinic at [facility]. VCD suspected there. s/p speech therap in late 1990s  ACEi Intolerant OBESITY, NOS (ICD-278.00) LEG PAIN OR KNEE PAIN  (ICD-729.5) HYPERTENSION, BENIGN SYSTEMIC (ICD-401.1) HYPERCHOLESTEROLEMIA (ICD-272.0) GASTROESOPHAGEAL REFLUX, NO ESOPHAGITIS (ICD-530.81) DIABETES MELLITUS II, UNCOMPLICATED (ICD-250.00) DEPRESSION, MAJOR, RECURRENT (ICD-296.30) ATRIAL FIBRILLATION (ICD-427.31)................[facility], Dr Clarene Duke >PAF. s/p ablation >s/p pulmonary vein isolation 11/08/08 >Followed by [facility] ASTHMA, UNSPECIFIED (ICD-493.90)....................DR Delford Field >charts mention "moderare persistent" severity.  >Last PFT was late 1990s per patient hx but in review  of paper chart Dr Marchelle Gearing could not find it july 2010 >Frequent exacerbagtions needing admits late 1990s >Positive skin testing for allergies - 1998 by Dr Sidney Ace >Normal spirometry on advair/singulair - july 2010 while symptomatic  #APNEA, SLEEP (ICD-780.57)....Dr. Vassie Loll  Past Surgical History: Reviewed history from 05/06/2010 and no changes required. -Catherization 04/2010: 1. No occlusive coronary disease. 2. Severe LV dysfunction with EF 15-20%. -Medtronic Protecta XT ICD, Dr Johney Frame, 1//2012, for A fib -Colonoscopy 06/2009: Pandiverticulosis. Dr Loreta Ave -s/p pulmonary vein isolation 11/08/08 at [facility] -ABI = 1.99 - 03/31/2001,  -Cardiac cath - 08/11/2005, Cardiac Cath- N Ef, mild CAD - 04/13/2002,  Cardiolite - scar at apical/lat wall, pos ischemia ant/anteroseptal wall - 07/20/2005,  -Cholecystectomy -,  -Flex sigmoid - nl - 06/02/2005,  -Nissen Fundoplication 1/98 -,  -R Breast Bx - sclerosing adenosis 5/93 -,  -R Knee Arthroscopy - 10/12/1998, Sleep study -[facility] - 04/13/2004  Family History: Reviewed history from 06/10/2006 and no changes required. Father is deceased at 68 yo with DM and HTN., Mom died during a hysterectomy d/t a bleeding complication.  Social History: Reviewed history from 06/10/2006 and no changes required. Husband died 2022/11/22, lives w/ Daughter-Diana Shackleford. Nonsmoker, doesn't drink.  Involved in non-denominational  [employer]. Not working  Review of Systems       The patient complains of anorexia, weight gain, peripheral edema, prolonged cough, severe indigestion/heartburn, depression, and unusual weight change.  The patient denies fever, weight loss, vision loss, decreased hearing, hoarseness, chest pain, syncope, dyspnea on exertion, headaches, hemoptysis, abdominal pain, melena, hematochezia, hematuria, incontinence, genital sores, muscle weakness, transient blindness, and difficulty walking.    Physical Exam  General:  Tired appearing, downcast affect, obese.  NAD Head:  normocephalic, atraumatic, no abnormalities observed, and no abnormalities palpated.   Eyes:  vision grossly intact, pupils equal, pupils round, and pupils reactive to light.   Ears:  R ear normal and L ear normal.   Nose:  no external deformity, no external erythema, and no nasal discharge.   Neck:  supple, full ROM, no masses, no JVD, and no neck tenderness.   Lungs:  normal respiratory effort and no intercostal retractions.  No accessory muscle usage, crackles at the right base and decreased breath sounds at left base.  no wheezes, rales, or ronchi Heart:  Rate of 60, irregularly irregular.  No JVD.  No murmurs noted.  Radial pulses barely palpable. Abdomen:  soft, non-tender, and normal bowel sounds.  Obese. Msk:  normal ROM, no joint  tenderness, no joint swelling, and no joint warmth.   Pulses:  Barely palpable radial pulses. Extremities:  2-3+ pitting edema to the knees bilaterally.  No thigh/abdominal edema. Neurologic:  alert & oriented X3, cranial nerves II-XII intact, strength normal in all extremities, and sensation intact to light touch.   Skin:  turgor normal, no rashes, and no suspicious lesions.   Cervical Nodes:  no anterior cervical adenopathy and no posterior cervical adenopathy.   Psych:  Oriented X3, memory intact for recent and remote, normally interactive, dysphoric affect, subdued, withdrawn, and poor eye  contact.     Impression & Recommendations:  Problem # 1:  CONGESTIVE HEART FAILURE, SEVERE (ICD-428.0)  The patient is in an acute CHF exacerbation.  This is likely secondary to having an insufficient diuretic as an outpatient.  Unfortunately, her low blood pressures do make continued diuresis somewhat problematic.  Will hold her spironolactone for now and will give her 10mg  torsemide by mouth daily with strict I/Os and daily standing weights.  Will also obtain a BMET, BNP, CBC, EKG, and PA/Lat CXR to evaluate extent of exacerbation.  Will place on a tele bed.  The following medications were removed from the medication list:    Furosemide 40 Mg Tabs (Furosemide) .Marland Kitchen... Take one three times per week as needed per dr little Her updated medication list for this problem includes:    Cozaar 50 Mg Tabs (Losartan potassium) .Marland Kitchen... 1 tablet  by mouth daily daily    Coumadin 5 Mg Tabs (Warfarin sodium) .Marland Kitchen... Take as directed    Metoprolol Tartrate 100 Mg Tabs (Metoprolol tartrate) ..... One bid    Spironolactone 25 Mg Tabs (Spironolactone) .Marland Kitchen... 1/2 tablet daily  Orders: [facility]- Est Level  5 (16109)  Problem # 2:  ATRIAL FIBRILLATION (ICD-427.31) Pt is currently rate controlled.  Will continue outpatient medications.  Coumadin per pharmacy.  The following medications were removed from the medication list:    Tikosyn 250 Mcg Caps (Dofetilide) .Marland Kitchen... 1 by mouth two times a day    Diltiazem Hcl Er Beads 300 Mg Xr24h-cap (Diltiazem hcl er beads) ..... Once daily per dr little Her updated medication list for this problem includes:    Coumadin 5 Mg Tabs (Warfarin sodium) .Marland Kitchen... Take as directed    Amiodarone Hcl 400 Mg Tabs (Amiodarone hcl) .Marland Kitchen... Take one two times a day until jan 31, then 1/2 two times a day    Metoprolol Tartrate 100 Mg Tabs (Metoprolol tartrate) ..... One bid  Problem # 3:  HYPERTENSION, BENIGN SYSTEMIC (ICD-401.1)  The patient is currently borderline hypotensive.  Will continue  metoprolol and cozaar secondary to her CHF but will hold spironolactone.  Will monitor blood pressure throughout hospital stay.  The following medications were removed from the medication list:    Diltiazem Hcl Er Beads 300 Mg Xr24h-cap (Diltiazem hcl er beads) ..... Once daily per dr little    Furosemide 40 Mg Tabs (Furosemide) .Marland Kitchen... Take one three times per week as needed per dr little Her updated medication list for this problem includes:    Cozaar 50 Mg Tabs (Losartan potassium) .Marland Kitchen... 1 tablet  by mouth daily daily    Metoprolol Tartrate 100 Mg Tabs (Metoprolol tartrate) ..... One bid    Spironolactone 25 Mg Tabs (Spironolactone) .Marland Kitchen... 1/2 tablet daily  Orders: [facility]- Est Level  5 (60454)  Problem # 4:  HYPERCHOLESTEROLEMIA (ICD-272.0) Will continue home medications.  Her updated medication list for this problem includes:    Pravastatin Sodium 20 Mg  Tabs (Pravastatin sodium) .Marland Kitchen... 1 once daily  Problem # 5:  ANEMIA, NORMOCYTIC (ICD-285.9) Will continue home medications.  Her updated medication list for this problem includes:    Ferrous Sulfate 325 (65 Fe) Mg Tabs (Ferrous sulfate) .Marland Kitchen... 1 tab by mouth two times a day for iron deficiency  Problem # 6:  GASTROESOPHAGEAL REFLUX, NO ESOPHAGITIS (ICD-530.81)  Will continue home medications.  Her updated medication list for this problem includes:    Nexium 40 Mg Cpdr (Esomeprazole magnesium) ..... One by mouth daily  Orders: [facility]- Est Level  5 (16109)  Problem # 7:  APNEA, SLEEP (ICD-780.57) Will write for CPAP as needed overnight.  Problem # 8:  ASTHMA, PERSISTENT (ICD-493.90)  Will continue home medications for this problem.  The following medications were removed from the medication list:    Xopenex 0.63 Mg/42ml Nebu (Levalbuterol hcl) ..... One in nebulizer four times daily as needed per dr Ethelda Chick 15 Mcg/20ml Nebu (Arformoterol tartrate) .Marland Kitchen... 1 vial via hhn two times a day Her updated medication list for this  problem includes:    Budesonide 0.25 Mg/90ml Susp (Budesonide) .Marland Kitchen... 1 vial via hhn two times a day  brush/rinse/gargle after use    Xopenex Hfa 45 Mcg/act Aero (Levalbuterol tartrate) ..... Inhale 2 puff using inhaler every four hours as needed    Singulair 10 Mg Tabs (Montelukast sodium) ..... One daily  Orders: [facility]- Est Level  5 (60454)  Problem # 9:  FEN/GI: Low sodium, heart healthy diet.  Problem # 10:  PPx Is on protonix and coumadin.  No need for other ppx.  Problem # 11:  Dispo Pending improvement in breathing.  Problem # 12:  DEPRESSION, MAJOR, RECURRENT (ICD-296.30)  Patient was placed on wellbutrin during this past hospitalization.  Her halucinations and anxiety may, in part, be related to this medication.  Will hold for now pending further evaluation.  Orders: [facility]- Est Level  5 ([id number])  Complete Medication List: 1)  Budesonide 0.25 Mg/38ml Susp (Budesonide) .Marland Kitchen.. 1 vial via hhn two times a day  brush/rinse/gargle after use 2)  Cozaar 50 Mg Tabs (Losartan potassium) .Marland Kitchen.. 1 tablet  by mouth daily daily 3)  Coumadin 5 Mg Tabs (Warfarin sodium) .... Take as directed 4)  Flonase 50 Mcg/act Susp (Fluticasone propionate) .... Spray 1 spray into both nostrils twice a day 5)  Nexium 40 Mg Cpdr (Esomeprazole magnesium) .... One by mouth daily 6)  Cpap 11cmh20  .... Wear at bedtime 7)  Pravastatin Sodium 20 Mg Tabs (Pravastatin sodium) .Marland Kitchen.. 1 once daily 8)  Diabetic Shoes  .... Wear regularly 9)  Glucotrol 5 Mg Tabs (Glipizide) .... One tablet by mouth twice a day for diabetes 10)  Ferrous Sulfate 325 (65 Fe) Mg Tabs (Ferrous sulfate) .Marland Kitchen.. 1 tab by mouth two times a day for iron deficiency 11)  Xopenex Hfa 45 Mcg/act Aero (Levalbuterol tartrate) .... Inhale 2 puff using inhaler every four hours as needed 12)  Amiodarone Hcl 400 Mg Tabs (Amiodarone hcl) .... Take one two times a day until jan 31, then 1/2 two times a day 13)  Wellbutrin Sr 150 Mg Xr12h-tab (Bupropion hcl) .... Take  one every morning for mood 14)  Lantus 100 Unit/ml Soln (Insulin glargine) .Marland Kitchen.. 15 units subq at bedtime 15)  Metoprolol Tartrate 100 Mg Tabs (Metoprolol tartrate) .... One bid 16)  Singulair 10 Mg Tabs (Montelukast sodium) .... One daily 17)  Miralax Powd (Polyethylene glycol 3350) .Marland Kitchen.. 17gm daily 18)  Promethazine  Hcl 12.5 Mg Tabs (Promethazine hcl) .Marland Kitchen.. 1-2 tablets q4h as needed nausea 19)  Spironolactone 25 Mg Tabs (Spironolactone) .... 1/2 tablet daily   Orders Added: 1)  [facility]- Est Level  5 [16109]

## 2010-05-15 NOTE — Miscellaneous (Signed)
Summary: dnka diabetes edu appt   Clinical Lists Changes rec'd notice that pt did not keep her appt at [facility] 04/17/10.Golden Circle RN  April 21, 2010 8:50 AM

## 2010-05-15 NOTE — Assessment & Plan Note (Signed)
Summary: Pulmonary OV   Copy to:  Dr. Shan Levans Primary Provider/Referring Provider:  Cat Ta MD  CC:  3 wk follow up.  Pt states breathing and cough have improved but still fatigued.  Occas prod cough with light colored mucus.  Denies wheezing and chest tightness. [name]  History of Present Illness: 60  year-old, African-American  female, history of moderate persistent asthma and reflux disease.  Hx of VCD and cyclic cough.  Hx GERD   January 21, 2010 --Presents for an acute office visit. Complains of  Increased sob and wheezing since last night. Complains she had trouble sleeping due to wheezing. Has on/off again wheezing at night for 3 weeks. Has occasional  productive  cough with thick white mucus. with  chest tightness and wheezing. Used  used neb 2 hrs ago. Was seen recently for arthritis flare and leg pain tx w/ steroids. She finished Prednisone 1 week ago for leg pain. Denies chest pain, orthopnea, hemoptysis, fever, n/v/d, edema, headache.   January 28, 2010 10:01 AM Pt feels better and had RLL PNA.  Pt rx with avelox x 7days and is now better. Symptoms are improved. There is less cough and wheeze. Dyspnea is largely better.  No other issues noted CXR today is better   February 05, 2010 10:34 AM  The pt started wiht more cough and awakens from sleep dyspneic.    Notes some white and green mucus.  No real sinus congestion Notes more dyspnea.  No fever except at night.  Notes sweats at night.  Sl heartburn into back and worse with coughing. The pt was better from RML PNA two weeks ago with CXR clearance ,  now worse.  February 14, 2010 11:31 AM cough is better.  no wheeze.  no sleep issues.  No discolored mucus.  March 03, 2010 --Presents for an acute office visit. Complains of prod cough with white mucus, wheezing, increased SOB, hoarseness, body aches/fatigues x1week - states she feels like she has never gotten back to baseline since dx'd with PNA 1 month ago. Last xray showed near  complete resolution of RLL opacity.  Cough is minimally productive, no discoloration , no feer. Feels tired all the time. no energy. cough is not going away. Denies chest pain,  orthopnea, hemoptysis, fever, n/v/d, edema, headache, no weight loss.   March 19, 2010 --Returns with persistent cough and wheezing. She was tx w/ steroid taper and doxcycline.  Underwent CT chest for persisent RLL opacity, CT showed  Small area of clustered peribronchovascular nodularity in the right upper lobe may be infectious or post infectious in etiology.,   Right middle lobe scarring.  Returns today with no significant improvement.  cough meds help  but restart after it wears off. No discolored mucus, no fever. Denies chest pain,  orthopnea, hemoptysis, fever, n/v/d, edema, headache.   April 09, 2010 11:56 AM At last ov the pt was seen by NP and given a complex cough protocol Now is better.  Pt stays tired if walks.   Now not much cough. Does feel like has PN drip.  Has problem sleeping at night. Pt is dyspneic if walks.  Not breathing as deeply.   Preventive Screening-Counseling & Management  Alcohol-Tobacco     Alcohol drinks/day: 0     Smoking Status: never     Year Quit: 1980     Pack years: 10     Passive Smoke Exposure: no  Clinical Reports Reviewed:  CT of Chest:  03/10/2010: CT of Chest:  Findings: There may be small low attenuation nodules in the left lobe of the thyroid, which are nonspecific.  Mediastinal lymph nodes are not enlarged by CT size criteria.  Mild bihilar lymphoid tissue.  No axillary adenopathy.  Pulmonary arteries are enlarged. Heart is enlarged as well.  No pericardial effusion.   There is pulmonary parenchymal mosaic attenuation.  Calcified granuloma in the left upper lobe.  Clustered peribronchovascular micro nodularity in the right upper lobe (image 15) is likely infectious or post infectious in etiology.  Linear scarring and volume loss in the right middle lobe.   There  may be dependent pleural thickening bilaterally.  No pleural fluid.  Airway is unremarkable.   Incidental imaging of the upper abdomen shows no acute findings. No worrisome lytic or sclerotic lesions.   IMPRESSION:   1.  Small area of clustered peribronchovascular nodularity in the right upper lobe may be infectious or post infectious in etiology. 2.  Right middle lobe scarring. 3.  Pulmonary arterial hypertension. 4.  Pulmonary parenchymal mosaic attenuation can be seen with small airways disease.     Current Medications (verified): 1)  Budesonide 0.25 Mg/2ml Susp (Budesonide) .[name].. 1 Vial Via Hhn Two Times A Day  Brush/rinse/gargle After Use 2)  Eq Chlortabs 4 Mg Tabs (Chlorpheniramine Maleate) .[name].. 1 Every 4 Hrs As Needed. 3)  Cozaar 50 Mg Tabs (Losartan Potassium) .[name].. 1 Tablet  By Mouth Daily Daily 4)  Coumadin 5 Mg Tabs (Warfarin Sodium) .... Take As Directed 5)  Flonase 50 Mcg/act Susp (Fluticasone Propionate) .... Spray 1 Spray Into Both Nostrils Twice A Day 6)  Nexium 40 Mg Cpdr (Esomeprazole Magnesium) .... One By Mouth Daily 7)  Singulair 10 Mg Tabs (Montelukast Sodium) .... Take 1 Tablet By Mouth Once A Day Per Dr Delford Field 8)  Tikosyn 250 Mcg Caps (Dofetilide) .[name].. 1 By Mouth Two Times A Day 9)  Cpap 11cmh20 .... Wear At Bedtime 10)  Pravastatin Sodium 20 Mg Tabs (Pravastatin Sodium) .[name].. 1 Once Daily 11)  Diabetic Shoes .... Wear Regularly 12)  Glucophage 500 Mg Tabs (Metformin Hcl) .[name].. 1 Tab By Mouth Two Times A Day For Diabetes 13)  Glucotrol 5 Mg Tabs (Glipizide) .... One Tablet By Mouth Twice A Day For Diabetes 14)  Diltiazem Hcl Er Beads 300 Mg Xr24h-Cap (Diltiazem Hcl Er Beads) .... Once Daily Per Dr Clarene Duke 15)  Ferrous Sulfate 325 (65 Fe) Mg Tabs (Ferrous Sulfate) .[name].. 1 Tab By Mouth Two Times A Day For Iron Deficiency 16)  Tessalon 200 Mg Caps (Benzonatate) .[name].. 1 By Mouth Three Times A Day As Needed Cough 17)  Xopenex Hfa 45 Mcg/act Aero (Levalbuterol  Tartrate) .... Inhale 2 Puff Using Inhaler Every Four Hours As Needed 18)  Furosemide 40 Mg Tabs (Furosemide) .... Take One Three Times Per Week As Needed Per Dr Clarene Duke 19)  Xopenex 0.63 Mg/45ml  Nebu (Levalbuterol Hcl) .... One in Nebulizer Four Times Daily As Needed Per Dr Delford Field 20)  Hydromet 5-1.5 Mg/18ml Syrp (Hydrocodone-Homatropine) .[name].. 1-2 Tsp Every 4-6 Hr As Needed Cough 21)  Brovana 15 Mcg/41ml Nebu (Arformoterol Tartrate) .[name].. 1 Vial Via Hhn Two Times A Day 22)  Dexilant 60 Mg Cpdr (Dexlansoprazole) .... Once Daily Before Dinner  Allergies (verified): 1)  Ace Inhibitors  Past History:  Past medical, surgical, family and social histories (including risk factors) reviewed, and no changes noted (except as noted below).  Past Medical History: Reviewed history from 09/28/2009 and no changes required. UPDATED  10/30/2008 after review of old paper chart dating back to late 1990s  H. pylori negative 07/2001, Menopause at 60 y/o, on CPAP at night, sinus bradycardia with arrythmia noted on 01/21/01  #DISEASE, VOCAL CORD NEC (ICD-478.5)............DR Delford Field >mentioned in chart > Was followed by Dr. Annalee Genta ENT in late 1990s - lary showed findings c/w GERD > Was also followed by [facility] at [facility]. VCD suspected there. s/p speech therap in late 1990s  ACEi Intolerant OBESITY, NOS (ICD-278.00) LEG PAIN OR KNEE PAIN (ICD-729.5) HYPERTENSION, BENIGN SYSTEMIC (ICD-401.1) HYPERCHOLESTEROLEMIA (ICD-272.0) GASTROESOPHAGEAL REFLUX, NO ESOPHAGITIS (ICD-530.81) DIABETES MELLITUS II, UNCOMPLICATED (ICD-250.00) DEPRESSION, MAJOR, RECURRENT (ICD-296.30) ATRIAL FIBRILLATION (ICD-427.31)................[facility], Dr Clarene Duke >PAF. s/p ablation >s/p pulmonary vein isolation 11/08/08 >Followed by [facility] ASTHMA, UNSPECIFIED (ICD-493.90)....................DR Delford Field >charts mention "moderare persistent" severity.  >Last PFT was late 1990s per patient hx but in review  of paper chart  Dr Marchelle Gearing could not find it july 2010 >Frequent exacerbagtions needing admits late 1990s >Positive skin testing for allergies - 1998 by Dr Sidney Ace >Normal spirometry on advair/singulair - july 2010 while symptomatic  #APNEA, SLEEP (ICD-780.57)....Dr. Vassie Loll  Past Surgical History: Reviewed history from 07/11/2009 and no changes required. Colonoscopy 06/2009: Pandiverticulosis. Dr Loreta Ave s/p pulmonary vein isolation 11/08/08 at [facility] ABI = 1.99 - 03/31/2001,  Cardiac cath - 08/11/2005, Cardiac Cath- N Ef, mild CAD - 04/13/2002,  Cardiolite - scar at apical/lat wall, pos ischemia ant/anteroseptal wall - 07/20/2005,  Cholecystectomy -,  Flex sigmoid - nl - 06/02/2005,  Nissen Fundoplication 1/98 -,  R Breast Bx - sclerosing adenosis 5/93 -,  R Knee Arthroscopy - 10/12/1998, Sleep study -[facility] - 04/13/2004  Family History: Reviewed history from 06/10/2006 and no changes required. Father is deceased at 72 yo with DM and HTN., Mom died during a hysterectomy d/t a bleeding complication.  Social History: Reviewed history from 06/10/2006 and no changes required. Husband died 26-Nov-2022, lives w/ Daughter-Diana Hernandez. Nonsmoker, doesn't drink.  Involved in non-denominational [employer]. Not working  Review of Systems       The patient complains of shortness of breath with activity and non-productive cough.  The patient denies shortness of breath at rest, productive cough, coughing up blood, chest pain, irregular heartbeats, acid heartburn, indigestion, loss of appetite, weight change, abdominal pain, difficulty swallowing, sore throat, tooth/dental problems, headaches, nasal congestion/difficulty breathing through nose, sneezing, itching, ear ache, anxiety, depression, hand/feet swelling, joint stiffness or pain, rash, change in color of mucus, and fever.    Vital Signs:  Patient profile:   60 year old female Height:      66 inches Weight:      211.13 pounds BMI:     34.20 O2 Sat:      98 % on  Room air Temp:     98.0 degrees F oral Pulse rate:   83 / minute BP sitting:   106 / 80  (right arm) Cuff size:   large  Vitals Entered By: Gweneth Dimitri RN (April 09, 2010 11:36 AM)  O2 Flow:  Room air CC: 3 wk follow up.  Pt states breathing and cough have improved but still fatigued.  Occas prod cough with light colored mucus.  Denies wheezing and chest tightness.  Comments Medications reviewed with patient Daytime contact number verified with patient. Gweneth Dimitri RN  April 09, 2010 11:37 AM    Physical Exam  Additional Exam:  GEN: A/Ox3; pleasant , NAD HEENT:  [place]/AT, , EACs-clear, TMs-wnl, NOSE-clear, THROAT-clear NECK:  Supple w/ fair ROM;  no JVD; normal carotid impulses w/o bruits; no thyromegaly or nodules palpated; no lymphadenopathy. RESP  Coarse BS w/  improved upper airway psuedowheezing  CARD:  RRR, no m/r/g   GI:   Soft & nt; nml bowel sounds; no organomegaly or masses detected. [place]: Warm bil,  no calf tenderness edema, clubbing, pulses intact Neuro: intact w/ no focal deficitis detected.    Impression & Recommendations:  Problem # 1:  ASTHMA, PERSISTENT (ICD-493.90) Assessment Improved recent recurrent pneumonia exacerbated by microaspiration from vocal cord dysfunction and GERD with cyclical cough  plan no need for further CT imaging stay on two times a day nebulizer with brovana/budesonide stay on chlorpheniramine use as needed tessalon/hydrocodone stop dexilant/singulair no further antibiotics  Medications Added to Medication List This Visit: 1)  Dexilant 60 Mg Cpdr (Dexlansoprazole) .... Once daily before dinner  Complete Medication List: 1)  Budesonide 0.25 Mg/75ml Susp (Budesonide) .[name].. 1 vial via hhn two times a day  brush/rinse/gargle after use 2)  Eq Chlortabs 4 Mg Tabs (Chlorpheniramine maleate) .[name].. 1 every 4 hrs as needed. 3)  Cozaar 50 Mg Tabs (Losartan potassium) .[name].. 1 tablet  by mouth daily daily 4)  Coumadin 5 Mg Tabs (Warfarin  sodium) .... Take as directed 5)  Flonase 50 Mcg/act Susp (Fluticasone propionate) .... Spray 1 spray into both nostrils twice a day 6)  Nexium 40 Mg Cpdr (Esomeprazole magnesium) .... One by mouth daily 7)  Tikosyn 250 Mcg Caps (Dofetilide) .[name].. 1 by mouth two times a day 8)  Cpap 11cmh20  .... Wear at bedtime 9)  Pravastatin Sodium 20 Mg Tabs (Pravastatin sodium) .[name].. 1 once daily 10)  Diabetic Shoes  .... Wear regularly 11)  Glucophage 500 Mg Tabs (Metformin hcl) .[name].. 1 tab by mouth two times a day for diabetes 12)  Glucotrol 5 Mg Tabs (Glipizide) .... One tablet by mouth twice a day for diabetes 13)  Diltiazem Hcl Er Beads 300 Mg Xr24h-cap (Diltiazem hcl er beads) .... Once daily per dr little 14)  Ferrous Sulfate 325 (65 Fe) Mg Tabs (Ferrous sulfate) .[name].. 1 tab by mouth two times a day for iron deficiency 15)  Tessalon 200 Mg Caps (Benzonatate) .[name].. 1 by mouth three times a day as needed cough 16)  Xopenex Hfa 45 Mcg/act Aero (Levalbuterol tartrate) .... Inhale 2 puff using inhaler every four hours as needed 17)  Furosemide 40 Mg Tabs (Furosemide) .... Take one three times per week as needed per dr little 18)  Xopenex 0.63 Mg/25ml Nebu (Levalbuterol hcl) .... One in nebulizer four times daily as needed per dr Delford Field 19)  Hydromet 5-1.5 Mg/49ml Syrp (Hydrocodone-homatropine) .[name].. 1-2 tsp every 4-6 hr as needed cough 20)  Brovana 15 Mcg/76ml Nebu (Arformoterol tartrate) .[name].. 1 vial via hhn two times a day  Other Orders: Est. Patient Level IV (16109)  Patient Instructions: 1)  Stop Dexilant 2)  Stop singulair 3)  No further antibiotics  4)  Stay on nebulizer twice a day 5)  Stay on Nexium daily 6)  Stay on chlorpheniramine as needed for nasal drainage or cough  7)  Use tessalon or hydromet for cough spells 8)  Return one month

## 2010-05-15 NOTE — Progress Notes (Signed)
Summary: Triage   Phone Note From Other Clinic Call back at [phone]   Caller: Emily/[facility] Summary of Call: Home Care RN at pts home now, the anxiety medication prescribed was causing pt to hallucinate last night & have panic attacks, please advise. pt just released from the hospital. pts abdomin is swollen, feeling weak. Irving Burton thinks the pt may need to be admitted again.  Initial call taken by: Knox Royalty,  May 09, 2010 10:29 AM  Follow-up for Phone Call        I paged the inpatient resdient [facility]) who discharged the patient and am awaiting call back.  [facility] RN notified of this. Follow-up by: Dennison Nancy RN,  May 09, 2010 10:47 AM    Additional Follow-up for Phone Call Additional follow up Details #2::    Spoke with IP team.  The only med that is new is the Welbturin and patient was on that while in the hospital, do not understand why she is have ths kind of reaction now.  [facility] RN also concerned about weight gain since d/c (209 at discharge to 223 today).  Advised that she should come in for appt this afternoon.  Appt made for 3 pm. Follow-up by: Dennison Nancy RN,  May 09, 2010 11:04 AM

## 2010-05-15 NOTE — Progress Notes (Signed)
Summary: Multiple Complaints   Phone Note Call from Patient   Caller: Patient Summary of Call: Reciceved call from pt today with multiple non-specific complaints. Pt states that she has been fatigued, nauseous, having trouble sleeping..."I just dont feel good". Pt states that she has felt like this since october. Has been seen by Dr. Janalyn Harder as well Pulmonary about complaint. Recently seen by Dr. Janalyn Harder about sxs. Was given rx for alprazolam which pt states has helped some. Pt denies HA, CP, abd pain, diarrhea, constipation, trouble urinating, dysuria,  rash, fever, sick contacts. No bloody stools.  Pt says that she does feel down; but because of not feeling good. No HI/SI. Reviewed labs from 04/22/10 visit and possible concern for possible depression (mood issues). Symptoms of fatigue, nausea, generalized malaise are not acutely worse than before.  Blood sugars in 90-120s per pt. Blood pressures in 120s at home. Has had some increased WOB , relieved with xopenex. Told pt that she had followup appt with Dr. Janalyn Harder on Monday about sxs. Pt states.Marland KitchenMarland KitchenI just dont feel good. Pt feels that she needs to go to ED. Told pt that she had  that option. Also instructed pt that followup appt with Dr. Janalyn Harder would be beneficial in better assessing symptoms as symptoms are not worse since last clinical visit. Instructed pt that followup with Dr. Janalyn Harder would be beneficial, but that if she felt the need to go to ED, that would be understandable. Discussed red flags for calling back including fever, bloody emesis, bloody stools, elevated blood sugar, increased WOB or any other concerning symptoms. Pt agreeable to plan.  Doree Albee MD April 26, 2010 11:58 AM

## 2010-05-15 NOTE — Assessment & Plan Note (Signed)
Summary: Admission H&P: Fatigue    Primary Care Provider:  Cat Ta MD   History of Present Illness: 60 YOF with hx/o persistent asthma, OSA, a fib here with progressive fatigue, dyspnea and generalized malaise. Pt reports 2-3 month hx/o of worsening functional status s/p episode pf PNA in 01/2010. Pt/daughter reports overall increased WOB, intermittent orthopnea and DOE. has been compliant in pulmonary regimen . As needed albuterol has intermittently helped with breathing. Has overall noticed progressive worsening in fatigue and malaise. Unsure of weight gain over this time period. Has also had episodes of profuse crying and episodes of insomnia. Insomnia has sometimes been associated with agitation, sometimes with difficulty breathing.  Pt denies CP, however has had intermittent chest pressure. Chest pressure independent fo exertion.  Pt recently had viral gastroeneteritis approx 1 week. Pt reports persistence of nausea into this week. Pt saw Dr. Janalyn Harder last week about fatigue. Was given rx for xanax and labowrk was performed.  Lab work including TSH, hgb, electrolytes essentially WNL. Pt was tentatively scheduled for followup 04/28/10 when pt called 04/26/10 to discuss fatgue and then came into ED. In ED pt was noted to have BNP in 830s as well as PNA and cardiac enlargement.    Allergies: 1)  Ace Inhibitors  Past History:  Past Medical History: Last updated: 09/28/2009 UPDATED 10/30/2008 after review of old paper chart dating back to late 1990s  H. pylori negative 07/2001, Menopause at 60 y/o, on CPAP at night, sinus bradycardia with arrythmia noted on 01/21/01  #DISEASE, VOCAL CORD NEC (ICD-478.5)............DR Delford Field >mentioned in chart > Was followed by Dr. Annalee Genta ENT in late 1990s - lary showed findings c/w GERD > Was also followed by Speech Clinic at [facility]. VCD suspected there. s/p speech therap in late 1990s  ACEi Intolerant OBESITY, NOS (ICD-278.00) LEG PAIN OR KNEE PAIN  (ICD-729.5) HYPERTENSION, BENIGN SYSTEMIC (ICD-401.1) HYPERCHOLESTEROLEMIA (ICD-272.0) GASTROESOPHAGEAL REFLUX, NO ESOPHAGITIS (ICD-530.81) DIABETES MELLITUS II, UNCOMPLICATED (ICD-250.00) DEPRESSION, MAJOR, RECURRENT (ICD-296.30) ATRIAL FIBRILLATION (ICD-427.31)................[facility], Dr Clarene Duke >PAF. s/p ablation >s/p pulmonary vein isolation 11/08/08 >Followed by [facility] ASTHMA, UNSPECIFIED (ICD-493.90)....................DR Delford Field >charts mention "moderare persistent" severity.  >Last PFT was late 1990s per patient hx but in review  of paper chart Dr Marchelle Gearing could not find it july 2010 >Frequent exacerbagtions needing admits late 1990s >Positive skin testing for allergies - 1998 by Dr Sidney Ace >Normal spirometry on advair/singulair - july 2010 while symptomatic  #APNEA, SLEEP (ICD-780.57)....Dr. Vassie Loll  Past Surgical History: Last updated: 07/11/2009 Colonoscopy 06/2009: Pandiverticulosis. Dr Loreta Ave s/p pulmonary vein isolation 11/08/08 at [facility] ABI = 1.99 - 03/31/2001,  Cardiac cath - 08/11/2005, Cardiac Cath- N Ef, mild CAD - 04/13/2002,  Cardiolite - scar at apical/lat wall, pos ischemia ant/anteroseptal wall - 07/20/2005,  Cholecystectomy -,  Flex sigmoid - nl - 06/02/2005,  Nissen Fundoplication 1/98 -,  R Breast Bx - sclerosing adenosis 5/93 -,  R Knee Arthroscopy - 10/12/1998, Sleep study -OSA - 04/13/2004  Family History: Last updated: 06-12-2006 Father is deceased at 59 yo with DM and HTN., Mom died during a hysterectomy d/t a bleeding complication.  Social History: Last updated: 06/12/06 Husband died 59/09/2022, lives w/ Daughter-Diana Leduc. Nonsmoker, doesn't drink.  Involved in non-denominational [employer]. Not working  Risk Factors: Alcohol Use: 0 (04/09/2010) Diet: trying to eat healthy (01/14/2010) Exercise: yes (09/23/2009)  Risk Factors: Smoking Status: never (04/09/2010) Passive Smoke Exposure: no (04/09/2010)  Family  History: Reviewed history from 2006/06/12 and no changes required. Father is deceased at 56 yo  with DM and HTN., Mom died during a hysterectomy d/t a bleeding complication.  Social History: Reviewed history from 06/10/2006 and no changes required. Husband died 11/30/2022, lives w/ Daughter-Diana Reever. Nonsmoker, doesn't drink.  Involved in non-denominational [employer]. Not working  Physical Exam  General:  in bed, in minimal distress Head:  normocephalic and atraumatic.   Mouth:  dry mucous membranes  Neck:  supple, full ROM, minimal JVD Lungs:  CTAB, faint end expiratory wheezes in apices  Heart:  RRR, no murmurs auscultated  Abdomen:  obese, NT, +bowel sounds  Extremities:  2+ peripheral pulses, 1-2+ pitting edema Neurologic:  oriented x3, flat affect,  Psych:  dysphoric affect and poor eye contact.   Additional Exam:  Temperature 98.5, heart rate 60-86,   respirations 21-26, blood pressure 115-143/70-84, and saturating 96% on   room air.   Hgb: 10.4 Cr: 1.08 K: 4.7 BNP: 853 UA SG 1.031 CXR: bibasilar airspace disease, cardiac enlargement, PNA vs. edema    Impression & Recommendations:  Problem # 1:  Cardiovascular Presentation has been somewhat unclear over the last 2-3 months. However, given report DOE, orthopnea, CXR and BNP, overall presentation concerning for new onset CHF vs. CHF exacerbation. Plan to obtain 2D ECHO. Will c/s cardiology. WIll start on HF medications including digoxin for symptomatic improvement. I feel that digoxin would be considered in this case given symptomatology at rest as well as pt seems intravascularly depleted despite elevated BNP. Will also cycle cardiac enzymes. Will also start on spironolactone. Will continue afib meds including tikosyn and diltiazem.   Problem # 2:  Pulmonary Overall presentation  also concerning for PNA and Asthma exacerbation. Currently no O2 requirement. Will start pt on by mouth prednisone and avelox. I/S at  bedside. Will continue outpt nebs. Will also contiune CPAP for OSA.   Problem # 3:  FEN/GI Pt with noted SG of 1.031 on UA as well clinical mild dehaydration. Will gently hydrate with NS @ 50ccs per hour. Will also give zofran for intermittent nausea. Cr currently stable which is reassuring from a perfusion standpoint, though vascular congestion may be contributing. Will place on heart healthy carb modified diet.   Problem # 4:  ENDO Will hold oral diabetic meds given volume and cardiac status. Will place on SSI and obtain A1C. Will hold pravachol   Problem # 5:  Psych  Pt w/ hx/o major depressive d/o. This is also possibly contributing to picture. Will likely need starting of SSRI. Will defer until discharge. Will continue as needed xanax while in house.   Problem # 6:  PPX On coumadin. INR @ 4.0. PPI   Problem # 7:  Dispo  Pending further evaluation.   Complete Medication List: 1)  Budesonide 0.25 Mg/60ml Susp (Budesonide) .Marland Kitchen.. 1 vial via hhn two times a day  brush/rinse/gargle after use 2)  Eq Chlortabs 4 Mg Tabs (Chlorpheniramine maleate) .Marland Kitchen.. 1 every 4 hrs as needed. 3)  Cozaar 50 Mg Tabs (Losartan potassium) .Marland Kitchen.. 1 tablet  by mouth daily daily 4)  Coumadin 5 Mg Tabs (Warfarin sodium) .... Take as directed 5)  Flonase 50 Mcg/act Susp (Fluticasone propionate) .... Spray 1 spray into both nostrils twice a day 6)  Nexium 40 Mg Cpdr (Esomeprazole magnesium) .... One by mouth daily 7)  Tikosyn 250 Mcg Caps (Dofetilide) .Marland Kitchen.. 1 by mouth two times a day 8)  Cpap 11cmh20  .... Wear at bedtime 9)  Pravastatin Sodium 20 Mg Tabs (Pravastatin sodium) .Marland Kitchen.. 1 once daily 10)  Diabetic Shoes  .... Wear regularly 11)  Glucophage 500 Mg Tabs (Metformin hcl) .Marland Kitchen.. 1 tab by mouth two times a day for diabetes 12)  Glucotrol 5 Mg Tabs (Glipizide) .... One tablet by mouth twice a day for diabetes 13)  Diltiazem Hcl Er Beads 300 Mg Xr24h-cap (Diltiazem hcl er beads) .... Once daily per dr little 14)   Ferrous Sulfate 325 (65 Fe) Mg Tabs (Ferrous sulfate) .Marland Kitchen.. 1 tab by mouth two times a day for iron deficiency 15)  Tessalon 200 Mg Caps (Benzonatate) .Marland Kitchen.. 1 by mouth three times a day as needed cough 16)  Xopenex Hfa 45 Mcg/act Aero (Levalbuterol tartrate) .... Inhale 2 puff using inhaler every four hours as needed 17)  Furosemide 40 Mg Tabs (Furosemide) .... Take one three times per week as needed per dr little 18)  Xopenex 0.63 Mg/14ml Nebu (Levalbuterol hcl) .... One in nebulizer four times daily as needed per dr Delford Field 19)  Hydromet 5-1.5 Mg/57ml Syrp (Hydrocodone-homatropine) .Marland Kitchen.. 1-2 tsp every 4-6 hr as needed cough 20)  Brovana 15 Mcg/11ml Nebu (Arformoterol tartrate) .Marland Kitchen.. 1 vial via hhn two times a day 21)  Alprazolam 0.5 Mg Tabs (Alprazolam) .Marland Kitchen.. 1 tab by mouth every 4-6 hours as needed nerves. can take before bedtime.

## 2010-05-15 NOTE — Progress Notes (Signed)
Summary: Checking on pt   Phone Note Outgoing Call   Call placed by: Zaquan Duffner MD,  April 23, 2010 6:54 PM Call placed to: Patient Summary of Call: Called to check on pt.  When I saw her yesterday she was very distraught and was crying in the clinic.  Pt not home.  Left message that I was calling to check on her.  Initial call taken by: Keanon Bevins MD,  April 23, 2010 6:54 PM     Appended Document: Checking on pt pt returned call and she is doing better today- she was asleep when Brena Windsor called and she is still very sleepy, but doing better.

## 2010-05-15 NOTE — Miscellaneous (Signed)
Summary: Device preload  Clinical Lists Changes  Observations: Added new observation of ICD INDICATN: NICM Long QT (05/07/2010 8:00) Added new observation of ICDLEADSTAT2: active (05/07/2010 8:00) Added new observation of ICDLEADSER2: [id number] (05/07/2010 8:00) Added new observation of ICDLEADMOD2: 6935  (05/07/2010 8:00) Added new observation of ICDLEADLOC2: RV  (05/07/2010 8:00) Added new observation of ICDLEADSTAT1: active  (05/07/2010 8:00) Added new observation of ICDLEADSER1: [id number]  (05/07/2010 8:00) Added new observation of ICDLEADMOD1: 5076  (05/07/2010 8:00) Added new observation of ICDLEADLOC1: RA  (05/07/2010 8:00) Added new observation of ICD IMP MD: Hillis Range, MD  (05/07/2010 8:00) Added new observation of ICDLEADDOI2: 05/02/2010  (05/07/2010 8:00) Added new observation of ICDLEADDOI1: 05/02/2010  (05/07/2010 8:00) Added new observation of ICD IMPL DTE: 05/02/2010  (05/07/2010 8:00) Added new observation of ICD SERL#: [id number] H  (05/07/2010 8:00) Added new observation of ICD MODL#: [id number]  (05/07/2010 8:00) Added new observation of ICDMANUFACTR: [employer]  (05/07/2010 8:00) Added new observation of ICD MD: Hillis Range, MD  (05/07/2010 8:00)       ICD Specifications Following MD:  Hillis Range, MD     ICD Vendor:  [employer]     ICD Model Number:  [id number]     ICD Serial Number:  [id number] ICD DOI:  05/02/2010     ICD Implanting MD:  Hillis Range, MD  Lead 1:    Location: RA     DOI: 05/02/2010     Model #: 6606     Serial #: [id number]     Status: active Lead 2:    Location: RV     DOI: 05/02/2010     Model #: 3557     Serial #: [id number]     Status: active  Indications::  NICM Long QT

## 2010-05-15 NOTE — Miscellaneous (Signed)
Summary: Changing prob   Clinical Lists Changes 

## 2010-05-15 NOTE — Assessment & Plan Note (Signed)
Summary: f/u pnumonia/nerve problems/eo   Vital Signs:  Patient profile:   60 year old female Height:      66 inches Weight:      220 pounds BMI:     35.64 Temp:     98.4 degrees F oral BP sitting:   130 / 78  (right arm) Cuff size:   regular  Vitals Entered By: Tessie Fass CMA (April 22, 2010 10:20 AM) CC: "nerves", dec appetite, can't sleep   Primary Care Provider:  Christene Pounds MD  CC:  "nerves", dec appetite, and can't sleep.  History of Present Illness: 60 y/o M with multiple medical problems is here for her "nerves".  Pt does not know why she has been  feeling this way.   She states that she has been dealing with a "long bout of pnuemonia", followed by Dr Delford Field.  And since then she has not wanted to get out of bed, she lacks motivation to eat, she feels tired all the time, she can't sleep at night.  She is crying during this visit, stating "You know that this is not me.  I'm not one to cry at the doctor's office."  Pt is very distraught.  She thinks that the sleep issue is bothering her the most.    She denies SI/HI.  Denies recent trauma.  She cannot identify any stressors.  She stays home during the day and a neighbor is helping her get her meds (although she has never been immobile or a shut-in).  Her daughter comes home at night.    Current Medications (verified): 1)  Budesonide 0.25 Mg/74ml Susp (Budesonide) .Marland Kitchen.. 1 Vial Via Hhn Two Times A Day  Brush/rinse/gargle After Use 2)  Eq Chlortabs 4 Mg Tabs (Chlorpheniramine Maleate) .Marland Kitchen.. 1 Every 4 Hrs As Needed. 3)  Cozaar 50 Mg Tabs (Losartan Potassium) .Marland Kitchen.. 1 Tablet  By Mouth Daily Daily 4)  Coumadin 5 Mg Tabs (Warfarin Sodium) .... Take As Directed 5)  Flonase 50 Mcg/act Susp (Fluticasone Propionate) .... Spray 1 Spray Into Both Nostrils Twice A Day 6)  Nexium 40 Mg Cpdr (Esomeprazole Magnesium) .... One By Mouth Daily 7)  Tikosyn 250 Mcg Caps (Dofetilide) .Marland Kitchen.. 1 By Mouth Two Times A Day 8)  Cpap 11cmh20 .... Wear At  Bedtime 9)  Pravastatin Sodium 20 Mg Tabs (Pravastatin Sodium) .Marland Kitchen.. 1 Once Daily 10)  Diabetic Shoes .... Wear Regularly 11)  Glucophage 500 Mg Tabs (Metformin Hcl) .Marland Kitchen.. 1 Tab By Mouth Two Times A Day For Diabetes 12)  Glucotrol 5 Mg Tabs (Glipizide) .... One Tablet By Mouth Twice A Day For Diabetes 13)  Diltiazem Hcl Er Beads 300 Mg Xr24h-Cap (Diltiazem Hcl Er Beads) .... Once Daily Per Dr Clarene Duke 14)  Ferrous Sulfate 325 (65 Fe) Mg Tabs (Ferrous Sulfate) .Marland Kitchen.. 1 Tab By Mouth Two Times A Day For Iron Deficiency 15)  Tessalon 200 Mg Caps (Benzonatate) .Marland Kitchen.. 1 By Mouth Three Times A Day As Needed Cough 16)  Xopenex Hfa 45 Mcg/act Aero (Levalbuterol Tartrate) .... Inhale 2 Puff Using Inhaler Every Four Hours As Needed 17)  Furosemide 40 Mg Tabs (Furosemide) .... Take One Three Times Per Week As Needed Per Dr Clarene Duke 18)  Xopenex 0.63 Mg/53ml  Nebu (Levalbuterol Hcl) .... One in Nebulizer Four Times Daily As Needed Per Dr Delford Field 19)  Hydromet 5-1.5 Mg/76ml Syrp (Hydrocodone-Homatropine) .Marland Kitchen.. 1-2 Tsp Every 4-6 Hr As Needed Cough 20)  Brovana 15 Mcg/8ml Nebu (Arformoterol Tartrate) .Marland Kitchen.. 1 Vial Via Hhn Two Times A  Day  Allergies (verified): 1)  Ace Inhibitors  Review of Systems General:  Complains of fatigue, loss of appetite, and sleep disorder; denies chills, fever, and weakness. ENT:  Denies difficulty swallowing, hoarseness, and sore throat. CV:  Denies chest pain or discomfort, fainting, and palpitations. Resp:  Denies chest discomfort, chest pain with inspiration, cough, coughing up blood, and wheezing. GI:  Denies abdominal pain, change in bowel habits, nausea, and vomiting. Psych:  Complains of anxiety, depression, and easily tearful; denies sense of great danger, suicidal thoughts/plans, thoughts of violence, unusual visions or sounds, and thoughts /plans of harming others.  Physical Exam  General:  Well dressed, well groomed F in moderate distress, with crying episodes.   Psych:  Oriented  X3, memory intact for recent and remote, normally interactive, good eye contact, not agitated, not suicidal, not homicidal, depressed affect, tearful, slightly anxious, and judgment fair.   Pt is crying during visit.  She voiced that she does not know why she is feeling this way.     Impression & Recommendations:  Problem # 1:  FATIGUE (ICD-780.79) Assessment New Complaints of fatigue, anxiety, loss of appetite, lack of energy all may be from lack of sleep vs depression vs anxiety vs insomnia.  I am not sure what the stressor or instigating event was.  She is usually very composed and happy-appearing during visits here.  She is still taking all her medications, but not eating much.  I will try to help her with "nerves" and insomnia with short course of alprazolam, as pt is too distressed to discuss depression screening in current state.  Discussed that I am concerned about underlining depression/anxiety.  Pt denies SI/HI.  She will f/u in one week and at that appt we will delve further into her current state.    Will check labs CBC, Bmet, TSH, Prealbumin.   Orders: CBC-[facility] (24401) Basic Met-[facility] [phone]) TSH-[facility] [phone]) Miscellaneous Lab Charge-[facility] [phone]) [facility]- Est Level  3 (43329)  Problem # 2:  LOSS OF APPETITE (ICD-783.0) Assessment: New See above.   Orders: Miscellaneous Lab Charge-[facility] [id number]) [facility]- Est Level  3 (16606)  Complete Medication List: 1)  Budesonide 0.25 Mg/32ml Susp (Budesonide) .Marland Kitchen.. 1 vial via hhn two times a day  brush/rinse/gargle after use 2)  Eq Chlortabs 4 Mg Tabs (Chlorpheniramine maleate) .Marland Kitchen.. 1 every 4 hrs as needed. 3)  Cozaar 50 Mg Tabs (Losartan potassium) .Marland Kitchen.. 1 tablet  by mouth daily daily 4)  Coumadin 5 Mg Tabs (Warfarin sodium) .... Take as directed 5)  Flonase 50 Mcg/act Susp (Fluticasone propionate) .... Spray 1 spray into both nostrils twice a day 6)  Nexium 40 Mg Cpdr (Esomeprazole magnesium) .... One by mouth daily 7)  Tikosyn 250 Mcg  Caps (Dofetilide) .Marland Kitchen.. 1 by mouth two times a day 8)  Cpap 11cmh20  .... Wear at bedtime 9)  Pravastatin Sodium 20 Mg Tabs (Pravastatin sodium) .Marland Kitchen.. 1 once daily 10)  Diabetic Shoes  .... Wear regularly 11)  Glucophage 500 Mg Tabs (Metformin hcl) .Marland Kitchen.. 1 tab by mouth two times a day for diabetes 12)  Glucotrol 5 Mg Tabs (Glipizide) .... One tablet by mouth twice a day for diabetes 13)  Diltiazem Hcl Er Beads 300 Mg Xr24h-cap (Diltiazem hcl er beads) .... Once daily per dr little 14)  Ferrous Sulfate 325 (65 Fe) Mg Tabs (Ferrous sulfate) .Marland Kitchen.. 1 tab by mouth two times a day for iron deficiency 15)  Tessalon 200 Mg Caps (Benzonatate) .Marland Kitchen.. 1 by mouth three times a  day as needed cough 16)  Xopenex Hfa 45 Mcg/act Aero (Levalbuterol tartrate) .... Inhale 2 puff using inhaler every four hours as needed 17)  Furosemide 40 Mg Tabs (Furosemide) .... Take one three times per week as needed per dr little 18)  Xopenex 0.63 Mg/68ml Nebu (Levalbuterol hcl) .... One in nebulizer four times daily as needed per dr Delford Field 19)  Hydromet 5-1.5 Mg/34ml Syrp (Hydrocodone-homatropine) .Marland Kitchen.. 1-2 tsp every 4-6 hr as needed cough 20)  Brovana 15 Mcg/18ml Nebu (Arformoterol tartrate) .Marland Kitchen.. 1 vial via hhn two times a day 21)  Alprazolam 0.5 Mg Tabs (Alprazolam) .Marland Kitchen.. 1 tab by mouth every 4-6 hours as needed nerves. can take before bedtime.  Patient Instructions: 1)  Please schedule a follow-up appointment for Monday 04/28/10 with Dr Janalyn Harder for nerves. 2)  Alprazolam 0.5mg  every 4-6 hours as needed nerves.  You can take before bedtime to help you sleep. 3)  I'm sorry you are not feeling well.  I hope that the nerve medicine will help you feel better and get some sleep.  4)  I will call you tomorrow to check on you.   Prescriptions: ALPRAZOLAM 0.5 MG TABS (ALPRAZOLAM) 1 tab by mouth every 4-6 hours as needed nerves. Can take before bedtime.  #30 x 0   Entered and Authorized by:   Angeline Slim MD   Signed by:   Angeline Slim MD on 04/22/2010    Method used:   Telephoned to ...       [facility]  [address] [id number] (retail)       [address]       [place], [place]  [address]       Ph: [phone] or [phone]       Fax: [phone]   RxID:   [id number]    Orders Added: 1)  CBC-[facility] [85027] 2)  Basic Met-[facility] [[id number]] 3)  TSH-[facility] [[id number]] 4)  Miscellaneous Lab Charge-[facility] [99999] 5)  [facility]- Est Level  3 [43329]

## 2010-05-19 NOTE — Discharge Summary (Signed)
NAME:  [name], [name]                 ACCOUNT NO.:  [id number]  MEDICAL RECORD NO.:  [id number]          PATIENT TYPE:  INP  LOCATION:  4707                         FACILITY:  [facility]  PHYSICIAN:  Leighton Roach Yared Barefoot, M.D.DATE OF BIRTH:  07-30-1950  DATE OF ADMISSION:  05/09/2010 DATE OF DISCHARGE:  05/13/2010                              DISCHARGE SUMMARY   PRIMARY CARE PROVIDER:  Angeline Slim, MD, at [facility].  DISCHARGE DIAGNOSES: 1. Congestive heart failure. 2. Atrial fibrillation. 3. Hyperlipidemia. 4. Diabetes. 5. Anemia. 6. Asthma. 7. Gastroesophageal reflux disease. 8. Sleep apnea. 9. Depression.  DISCHARGE MEDICATIONS:  New medications that the patient was discharged on include: 1. Amiodarone 300 mg p.o. b.i.d. 2. Digoxin 0.0625 mg p.o. daily. 3. Pepcid 40 mg p.o. b.i.d. 4. Metoprolol 50 mg p.o. b.i.d. 5. Torsemide 20 mg p.o. daily.  Medications which remain the same include: 1. Budesonide one neb inhaled b.i.d. p.r.n. shortness of breath. 2. Wellbutrin 150 mg p.o. daily. 3. Coumadin 7.5 mg p.o. daily. 4. Ferrous sulfate 325 mg p.o. b.i.d. 5. Flonase 1 spray each nostril daily. 6. Glipizide 5 mg p.o. b.i.d. 7. Lantus 15 units subcu nightly. 8. Losartan 50 mg p.o. daily. 9. Montelukast 10 mg p.o. nightly. 10.Nexium 40 mg p.o. daily. 11.Pravastatin 20 mg p.o. daily. 12.Promethazine 12.5-25 mg p.o. q.4 h. p.r.n. nausea. 13.MiraLax 17 g by mouth p.o. daily. 14.Spironolactone 12.5 mg p.o. daily. 15.Xopenex 2 puffs inhaled q.i.d. p.r.n. shortness of breath.  Medications which were stopped include: 1. Amiodarone 400 mg p.o. b.i.d. 2. Metoprolol 100 mg p.o. b.i.d.  CONSULTS:  Cardiology with [facility].  PROCEDURE:  Chest x-ray on May 09, 2010, showing moderate cardiomegaly, permanent spacer, and mild bibasilar atelectasis, no definite infiltrate or effusion seen.  LABORATORY DATA:  On admission, the patient's CBC  7.3/10.9/34.1/254. BMET was within normal limits with the exception of potassium of 5.4, creatinine 1.49.  BMET was as follows 135/5.4/95/26/22/1.49/199, calcium of 8.9.  On the day before discharge, BMET remained stable 135/4.4/97/30/13/1.19/125.  BNP on admission 1463, on discharge 837. Digoxin level on May 12, 2010, is 1.1.  INR on the day of admission was 2.34, on day of discharge 2.17.  BRIEF HOSPITAL COURSE:  This is a 60 year old female readmitted on May 09, 2010, after discharge on May 08, 2010, with past medical history of CHF, AFib, hypertension, diabetes presenting with an increased lower extremity edema, shortness of breath, CHF exacerbation, and delirium. 1. Congestive heart failure exacerbation.  The patient was     aggressively diuresed.  The patient's weight had increased from     100.6 kg on the day of discharge on May 08, 2010, to 102.4 kg     on the day of admission, May 09, 2010.  Weight had decreased     down to 99.5kg on the day of discharge, May 13, 2010.  The     patient's BNP also improved; on admission it was 1463, went down     into the 800s on the day of discharge again after good diuresis.     The patient had almost 4 liters  out total.  Blood pressure     tolerated this well, and heart rate remained rate controlled.  It     is known that the patient's ejection fraction was 15-20% by cardiac     cath during last hospitalization.  The patient's metoprolol was     decreased, and spironolactone was held on initial admission.  The     patient was continued on digoxin, Demadex, losartan, and     metoprolol.  While in-house, Cardiology was consulted for any extra     input and assisted with medication changes. 2. Atrial fibrillation.  The patient remained rate controlled.  On the     day of discharge, rate was 60-76.  The patient remained on losartan     and metoprolol as well as digoxin.  Digoxin level on May 12, 2010, was slightly  elevated at 1.1 with a goal of 0.6-0.8.  The     patient's dose was cut in half to 0.0625 on discharge, it should be     rechecked in approximately 1 week.  It appears this will happen at      [facility]. 3. Diabetes.  CBGs remained within normal limits on the patient's home     regimen remaining mostly in the 110s to 180s.  No changes were     made. 4. Hyperlipidemia.  The patient's statin was continued. 5. Anemia.  The patient's CBC remained stable.  Iron and bowel regimen     were continued. 6. Asthma.  After diuresis, the patient had no oxygen requirement, was     satting well on room air.  The patient's home medications were     continued. 7. Gastroesophageal reflux disease.  The patient's home Protonix was     continued, and ranitidine was added to her regimen. 8. Sleep apnea.  The patient continued CPAP at bedtime. 9. Depression.  Initially, the patient and her daughter questioned     whether the delirium, anxiety, and syncope in the room the night     prior to discharge were secondary to having started Wellbutrin     during her last hospitalization; however, we feel that the more     likely cause of this was hypoxia due to fluid overload and the     patient was restarted on Wellbutrin, she tolerated it well and was     discharged home on it.  The patient is to follow up with: 1. Dr. Clarene Duke on May 21, 2010, at 9:20 a.m. at [facility].     The patient is to arrive there at 9:00 a.m. to have INR and digoxin     levels checked. 2. Dr. Janalyn Harder at [facility] on May 23, 2010, at 4:30.     Issues for Dr. Janalyn Harder to follow up on include checking a BMET to check     the patient's potassium as it was elevated on admission and the     patient is on spironolactone, however, that is a small dose.  In      addition, the patient's blood pressures and heart rate should be monitored and     CHF status including pulmonary and lower extremity edema  should be     assessed.  DISCHARGE CONDITION:  The patient was discharged home in stable medical condition.    ______________________________ Demetria Pore, MD   ______________________________ Leighton Roach Sabriyah Wilcher, M.D.    JM/MEDQ  D:  05/13/2010  T:  05/14/2010  Job:  [id number]  cc:   Angeline Slim, MD  Electronically Signed by Demetria Pore MD on 05/14/2010 08:48:58 AM Electronically Signed by Acquanetta Belling M.D. on 05/19/2010 09:53:52 AM

## 2010-05-21 NOTE — Progress Notes (Signed)
Summary: needs orders/done/ts   Phone Note Call from Patient Call back at [phone]   Caller: [facility] Summary of Call: needs verbal orders for rolling walker and bedside commode Initial call taken by: De Nurse,  May 12, 2010 2:18 PM  Follow-up for Phone Call        called and verbal order given. Follow-up by: Arlyss Repress CMA,,  May 12, 2010 3:43 PM

## 2010-05-22 ENCOUNTER — Ambulatory Visit (INDEPENDENT_AMBULATORY_CARE_PROVIDER_SITE_OTHER): Payer: Medicare Other

## 2010-05-22 ENCOUNTER — Encounter: Payer: Self-pay | Admitting: Internal Medicine

## 2010-05-22 DIAGNOSIS — I428 Other cardiomyopathies: Secondary | ICD-10-CM

## 2010-05-22 DIAGNOSIS — I509 Heart failure, unspecified: Secondary | ICD-10-CM

## 2010-05-23 ENCOUNTER — Other Ambulatory Visit: Payer: Self-pay | Admitting: Family Medicine

## 2010-05-23 ENCOUNTER — Inpatient Hospital Stay: Payer: Self-pay | Admitting: Family Medicine

## 2010-05-23 NOTE — Telephone Encounter (Signed)
Please review and refill

## 2010-05-23 NOTE — Telephone Encounter (Signed)
Refill request

## 2010-05-28 NOTE — Op Note (Signed)
NAME:  [name], [name]                 ACCOUNT NO.:  [id number]  MEDICAL RECORD NO.:  [id number]          PATIENT TYPE:  INP  LOCATION:  2923                         FACILITY:  [facility]  PHYSICIAN:  Hillis Range, MD       DATE OF BIRTH:  [birth year]  DATE OF PROCEDURE: DATE OF DISCHARGE:                              OPERATIVE REPORT   SURGEON:  Hillis Range, MD  PREPROCEDURE DIAGNOSES: 1. Prolonged QT interval. 2. Torsade de pointes, requiring defibrillation. 3. Nonischemic cardiomyopathy. 4. New York Heart Association class II/III congestive heart failure     chronically.  POSTPROCEDURE DIAGNOSES: 1. Prolonged QT interval. 2. Torsade de pointes, requiring defibrillation. 3. Nonischemic cardiomyopathy. 4. New York Heart Association class II/III congestive heart failure     chronically.  PROCEDURES: 1. Left upper extremity venography. 2. Defibrillator implantation. 3. Defibrillation threshold testing.  INTRODUCTION:  [name] is a 60 year old female with a history of a nonischemic cardiomyopathy for which she is chronically treated with medical therapies.  Her chronic medical therapies have been optimized, but are limited due to prior allergy to ACE inhibitors and also asthma, for which, doctors have been very reluctant to use beta-blockers in the past.  During this hospitalization.  She also has paroxysmal atrial fibrillation, for which she has been treated previously with Tikosyn. She recently presented with Torsade de pointes, requiring defibrillation.  Tikosyn was discontinued.  Despite a complete washout of Tikosyn, the patient unfortunately continues to have a prolonged QT interval with very frequent runs of nonsustained ventricular tachycardia and PVCs.  Given her longstanding cardiomyopathy as well as ventricular arrhythmias, it is felt most prudent to proceed with ICD implantation for secondary prevention of sudden cardiac arrest.  DESCRIPTION OF PROCEDURE:   Informed written consent was obtained and the patient was brought to the Electrophysiology Lab in the fasting state. She was adequately sedated with intravenous Versed and fentanyl as outlined in the nursing report.  The patient's left chest was prepped and draped in the usual sterile fashion by the EP Lab staff.  The skin overlying the left deltopectoral region was infiltrated with lidocaine for local analgesia.  A 4-cm incision was made over the left deltopectoral region.  A left subcutaneous ICD pocket was fashioned using a combination of sharp and blunt dissection.  Electrocautery was required to assure hemostasis.  A venogram of the left upper extremity was performed, which revealed a moderate-sized left cephalic vein with a moderate-sized left axillary vein, which both emptied into a large left subclavian vein.  The left axillary vein was therefore cannulated with fluoroscopic visualization.  Through the left axillary vein, a Medtronic model 5076 - 52 (serial number [id number]) right atrial lead, then a Medtronic model 6935 - 65 (serial number [id number]) right ventricular lead were advanced with fluoroscopic visualization into the right atrial appendage and right ventricular apex positions respectively.  The patient presented to the Electrophysiology Lab in atrial fibrillation; however, this atrial fibrillation is less than 24 hours old.  Atrial fibrillation waves measured 1 mV with an impedance of 656 ohms.  An atrial threshold could not be performed due to atrial  fibrillation. Right ventricular lead R-waves measured 14 mV with an impedance of 569 ohms and a threshold of 0.4 volts at 0.5 milliseconds.  Both leads were secured to the pectoralis fascia using #2 silk suture over the suture sleeves.  The leads were then connected to a Medtronic Protecta XT, model D314DRG (serial number [id number]) ICD.  The pocket was irrigated with copious gentamicin solution.  The defibrillator was  then placed into the pocket and secured to the pectoralis fascia with a single #2 silk suture.  The pocket was then closed in two layers with 2-0 Vicryl suture for the subcutaneous and subcuticular layers.  Steri-Strips and a sterile dressing were then applied.  Defibrillation threshold testing was then performed.  Ventricular fibrillation was induced with a T-wave shock.  Adequate sensing of ventricular fibrillation was observed with a programmed sensitivity of 1.2 mV with no dropout.  An initial 15 joules shock from the device was unsuccessful in defibrillating the patient.  A 25 joules shock was then delivered from the device, which successfully defibrillated the patient with an impedance of 55 ohms and a duration of 13 seconds.  The patient converted to sinus rhythm.  P-waves measured 2.1 mV.  The patient then returned to atrial fibrillation before and atrial threshold could be determined.  After a 5-minute resting, ventricular fibrillation was again induced with a T-wave shock. Adequate sensing of ventricular fibrillation with no dropout was then observed with a programmed sensitivity of 1.2 mV.  A single 20 joules shock delivered from the device successfully defibrillated the patient to sinus rhythm with an impedance of 56 ohms and a duration of 8 seconds.  The patient remained in sinus rhythm initially and again P- waves measured 2 mV through the device.  Again prior to being able to determine an atrial pacing threshold, the patient returned to atrial fibrillation.  The patient remained in atrial fibrillation thereafter. There were no early apparent complications.  CONCLUSIONS: 1. Atrial fibrillation upon presentation. 2. Successful implantation of a Medtronic Protecta XT ICD for     secondary prevention of sudden cardiac arrest. 3. DFT less than or equal to 20 joules. 4. Atrial fibrillation was initially cardioverted to sinus rhythm     during DFT testing on two separate  occasions, however, the patient     immediately returned to atrial fibrillation. 5. No early apparent complications.     Hillis Range, MD     JA/MEDQ  D:  05/02/2010  T:  05/03/2010  Job:  [id number]  cc:   Thereasa Solo. Little, M.D. Pearlean Brownie, M.D.  Electronically Signed by Hillis Range MD on 05/28/2010 10:21:24 PM

## 2010-05-28 NOTE — H&P (Signed)
NAME:  [name], [name] NO.:  [id number]  MEDICAL RECORD NO.:  [id number]          PATIENT TYPE:  INP  LOCATION:  3703                         FACILITY:  [facility]  PHYSICIAN:  Pearlean Brownie, M.D.DATE OF BIRTH:  02/29/1951  DATE OF ADMISSION:  04/26/2010 DATE OF DISCHARGE:                             HISTORY & PHYSICAL   PRIMARY CARE PROVIDER:  Angeline Slim, MD, at [facility].  CHIEF COMPLAINT:  Weakness and fatigue.  HISTORY OF PRESENT ILLNESS:  This is a 60 year old female with a past medical history of persistent asthma, OSA, and AFib as well as major depressive disorder who is here with progressive fatigue, dyspnea, and generalized malaise.  The patient reports overall 2-3 months of history of worsening functional status after episode of pneumonia back in October 2011.  The patient and daughter report the patient with overall increased work of breathing, intermittent orthopnea, and dyspnea on exertion.  The patient has been compliant with pulmonary regimen as well as cardiac medications for AFib and hypertension.  As-needed albuterol has intermittently helped with breathing.  The patient has noticed overall progressive worsening of fatigue and malaise.  Unsure of whether the patient has lost or gain weight over this time.  The patient has also had episodes of profuse crying and episodes of insomnia.  The patient is not sure if insomnia has been associated with agitation or whether insomnia was associated with difficulty breathing.  The patient denies any chest pain.  However, the patient has had intermittent chest pressure, which has been independent of exertion.  The patient also recently had viral gastroenteritis approximately 1-2 weeks ago.  The patient reports persistent of nausea into this week.  The patient saw Dr. Janalyn Harder last week about fatigue, was given prescription for Xanax as well as lab work being performed.  Lab work including  TSH, hemoglobin, and electrolyte was essentially within normal limits apart from sodium of 134 and elevated glucose up into the 250s.  The patient was tentatively scheduled for followup with Dr. Janalyn Harder on April 28, 2009, for followup on fatigue.  However, the patient called emergency room today complaining of fatigue and nonspecific symptoms with the patient subsequently going to the ED after the call.  In the ED, the patient was noted to have a BNP into the 830s as well as possible pneumonia on chest x-ray as well as cardiac enlargement and specific gravity of 1.031 on urinalysis.  ALLERGIES:  ACE INHIBITORS.  MEDICATIONS: 1. Budesonide 0.25 mg 1 vial via home health nurse b.i.d., brush,     rinse, and gargle after each. 2. EQ Chlor-Tabs 4 mg tablets 1 q.4 h. p.r.n. 3. Cozaar 50 mg 1 tablet p.o. daily. 4. Coumadin 5 mg to take as directed. 5. Flonase 50 mcg per actuation 1 spray each nostril b.i.d. 6. Nexium 40 mg 1 tablet p.o. daily. 7. Tikosyn 250 mcg 1 p.o. b.i.d. 8. CPAP wearing at bedtime. 9. Pravachol 20 mg p.o. daily. 10.Diabetic shoes. 11.Metformin 500 mg p.o. b.i.d. 12.Glucotrol 5 mg 1 tablet p.o. b.i.d. 13.Diltiazem extended release 300 mg 1 tablet p.o. daily. 14.Iron sulfate 325  mg 1 tablet p.o. b.i.d. 15.Tessalon Perles 200 mg 1 tablet p.o. t.i.d. cough. 16.Xopenex HFA 45 mcg 2 puffs inhaled q.4 h. p.r.n. 17.Lasix 40 mg 1 tablet p.o. 3 times weekly p.r.n. 18.Xopenex 0.63 mg 1 neb q.i.d. p.r.n. 19.Brovana 15 mcg 1 vial via HHN nurse 2 times a day. 20.Hydromet 5-1.5 one to two teaspoons q.4-6 h. as needed p.r.n.     cough. 21.Xanax 0.5 mg 1 tablet p.o. q.4-6 h. p.r.n. anxiety, can be taken     before bedtime.  PAST MEDICAL HISTORY: 1. Hypertension. 2. Hyperlipidemia. 3. GERD. 4. Type 2 diabetes. 5. Major recurrent depression. 6. History of AFib, status post failed ablation x2. 7. Persistent asthma. 8. Obstructive sleep apnea. 9. Vocal cord dysfunction.  PAST  SURGICAL HISTORY: 1. The patient had cardiac catheterization in May 2007 as well as     cardiac catheterization in 2004, history of cholecystectomy. 2. Nissen fundoplication in 1998. 3. Colonoscopy in March 2011 with pan-diverticulitis.  FAMILY HISTORY:  Both parents are currently deceased.  SOCIAL HISTORY:  The patient currently lives with daughter, [name].  The patient does not smoke and does not drink.  PHYSICAL EXAMINATION:  VITAL SIGNS:  Temperature 98.5, heart rate 60-86, respirations 21-26, blood pressure 115-143/70-84, and saturating 96% on room air. GENERAL:  The patient is up in bed, in minimal distress. HEENT:  Normocephalic and atraumatic.  Extraocular movements are intact. Mouth with dry mucous membranes. NECK:  Supple.  Full range of motion.  Minimal JVD palpated. LUNGS:  Clear to auscultation bilaterally.  Faint end-expiratory wheezes in apices.  Mild bibasilar coarse breath sounds at bases. HEART:  Regular rate and rhythm.  No rubs, gallops, or murmurs auscultated. ABDOMEN:  Obese and nontender.  Positive bowel sounds. EXTREMITIES:  2+ peripheral pulses, 1 to 2+ pitting edema. NEURO:  Oriented x3.  Flat affect. PSYCH:  Dysphoric affect and poor eye contact.  ASSESSMENT/PLAN:  This is a 60 year old female with multiple medical problems presenting with progressive fatigue, dyspnea, and generalized malaise. 1. Cardiovascular:  The patient's  overall presentation has been     somewhat unclear over the past 2-3 months.  However, given reports     of dyspnea on exertion and orthopnea, a chest x-ray as well as BNP,     overall presentation concerning for new onset CHF versus CHF     exacerbation, plan to obtain 2-D echo as well as consult     Cardiology.  Of note, the patient's cardiologist is Dr. Clarene Duke at     [facility].  We will plan to start the     patient on heart failure medications including digoxin for     symptomatic  improvement.  I feel that digoxin would be considered     in this case given symptomatology at rest as well as the patient     seems to be intravascularly treated despite elevated BNP.  We will     also cycle cardiac enzymes.  We will also start spironolactone.  We     will continue AFib meds including Tikosyn and diltiazem pending     Cardiology followup. 2. Pulmonary:  Overall, the patient's presentation is also concerning     for pneumonia and asthma exacerbation.  Currently, the patient is     without O2 requirements.  We will start the patient on p.o.     prednisone as well as Avelox as well as the incentive spirometry at     bedtime.  We  will continue with outpatient nebs as well as continue     CPAP for OSA.  We will check a chest x-ray in the morning. 3. Fluids, electrolytes, nutrition and gastrointestinal:  The patient     with noted specific gravity of 1.031 on UA as well as the patient     was noted to be with clinically mild dehydration on exam.  We will     gently hydrate with normal saline at 50 mL/hour in the setting the     patient's heart failure.  We will also give Zofran for intermittent     nausea.  Creatinine is currently stable, which is reassuring from     perfusion standpoint.  The vascular congestion may be contributing.     We will place the patient on heart-healthy carb-modified diet. 4. Endo:  We will hold on oral diabetic meds given volume and cardiac     status.  We will place on  SSI and obtain A1c.  We will hold     Pravachol. 5. Psych:  The patient with a history major depressive disorder.  I     feel that this is also contributing to overall picture.  However,     there is a significant organic component.  The patient will likely     need starting of SSRI, however, this will be deferred until     discharge.  We will continue with as-needed Xanax while in-house. 6. Prophylaxis:  The patient is currently on Coumadin with an INR of     4.0.  We will dose  Coumadin per Pharmacy, will likely need to be     held given supratherapeutic INR.  The patient is also on PPI. 7. Disposition:  Pending further evaluation.     Doree Albee, MD   ______________________________ Pearlean Brownie, M.D.    SN/MEDQ  D:  04/27/2010  T:  04/27/2010  Job:  [id number]  Electronically Signed by Doree Albee  on 05/10/2010 09:59:48 AM Electronically Signed by Pearlean Brownie M.D. on 05/28/2010 04:52:35 PM

## 2010-05-28 NOTE — Discharge Summary (Signed)
NAME:  [name], [name]                 ACCOUNT NO.:  [id number]  MEDICAL RECORD NO.:  [id number]          PATIENT TYPE:  INP  LOCATION:  4702                         FACILITY:  [facility]  PHYSICIAN:  Pearlean Brownie, M.D.DATE OF BIRTH:  1950/07/27  DATE OF ADMISSION:  04/26/2010 DATE OF DISCHARGE:  05/08/2010                              DISCHARGE SUMMARY   PRIMARY CARE PROVIDER:  Angeline Slim, MD  at [facility].  DIAGNOSES: 1. Congestive heart failure. 2. Atrial fibrillation status post failed x2. 3. Hypertension. 4. Hyperlipidemia. 5. Gastroesophageal reflux disease. 6. Type 2 diabetes. 7. Major recurrent depression. 8. Persistent asthma/chronic obstructive pulmonary disease. 9. Obstructive sleep apnea. 10.Vocal cord dysfunction. 11.Iron deficiency anemia.  DISCHARGE MEDICATIONS:  New medications on discharge include; 1. Amiodarone 400 mg p.o. b.i.d. until January 31, then 200 mg p.o.     b.i.d. starting February 1. 2. Wellbutrin 150 mg p.o. daily. 3. Lantus 15 units subcu at bedtime. 4. Metoprolol 100 mg p.o. b.i.d. 5. Singulair 10 mg p.o. at bedtime. 6. MiraLax 1 cap p.o. daily. 7. Phenergan 12.5-25 mg p.o. q.4 h. p.r.n. nausea. 8. Spironolactone 12.5 mg p.o. daily.  Medications which remained the same on discharge include; 1. Budesonide 1 neb inhaled b.i.d. p.r.n. 2. Warfarin 7.5 mg p.o. daily. 3. Ferrous sulfate 325 mg p.o. b.i.d. 4. Flonase 1 spray each nostril daily. 5. Glipizide 5 mg p.o. b.i.d. 6. Losartan 50 mg p.o. daily. 7. Nexium 40 mg p.o. daily. 8. Pravastatin 20 mg p.o. daily. 9. Xopenex 2 puffs inhaled q.i.d. p.r.n. shortness of breath.  Medications which were stopped on discharge include; 1. Tikosyn 250 mcg p.o. b.i.d. 2. Metformin 500 mg p.o. b.i.d. 3. Diltiazem 300 mg p.o. daily. 4. Benzonatate 200 mg p.o. t.i.d. p.r.n.  CONSULTS: 1. Critical Care Medicine. 2. [facility].  PROCEDURES: 1. Chest x-ray on January  14 showing cardiac enlargement with     bibasilar airspace disease, which may be pneumonia or edema, small     bilateral pleural effusions.  This chest x-ray remained relatively     unchanged with final chest x-ray on January 21 showing cardiac     enlargement, mild CHF, and stable bibasilar atelectasis. 2. 2D echo done on January 15 showing ejection fraction of 35-40%,     severe hypokinesis of the left ventricle. 3. Cardiac catheterization on January 19 showing no occlusive coronary     disease and severe left ventricular dysfunction with ejection     fraction of 15-20%. 4. ICD placement on January 20 with a successful implantation of     Medtronic Protecta XT ICD for secondary prevention of sudden     cardiac arrest.  DFT less than or equal to 20 joules.  AFib was     initially cardioverted to sinus rhythm during DFT testing on two     separate occasions, however, the patient immediately returned to     atrial fibrillation.  No early apparent complications.  LABORATORY DATA:  On admission, the patient's CBC was unremarkable; 5.2/10.4/32.5/267, which remained relatively unchanged at the time of discharge.  INR on admission was 4.02, it  was 2.81 on discharge.  The patient's CMET was unremarkable on admission, and relatively unchanged on discharge with the exception of creatinine of 1.27 on discharge. Hemoglobin A1c was 8.2.  Procalcitonin was less than 0.1 x2.  Troponins were negative x1 set.  BNP on admission was 853, increased to 1374 on January 16, and had decreased to 990 on January 25.  Lipid panel showing cholesterol 89, triglycerides 48, HDL 39, LDL 40.  TSH 2.158, vitamin D 29.  Urinalysis on admission was positive for small bilirubin, trace ketones, and 30 protein.  BRIEF HOSPITAL COURSE:  This is a 60 year old female with history of asthma, atrial fibrillation status post failed ablation x2, type 2 diabetes presenting with CHF and asthma exacerbation. 1. Cardiac.  On  initial admission, the patient's BNP was elevated to     853.  The patient's initial chest x-ray showed pneumonia versus     edema.  The patient was started on Avelox, prednisone to treat for     possible pneumonia versus COPD/asthma exacerbation, and was also     started on IV Lasix as an elevated BNP made the diagnosis of CHF     more likely.  During her second night, the patient went into     polymorphic ventricular tachycardia with frequent ventricular     ectopy and pausing.  The patient was defibrillated on January 16 at     approximately 1:00 a.m. and was brought back into AFib.  The cause     of this V-tach was likely a prolonged QT interval, which was likely     exacerbated by medications including Tikosyn and Avelox.  This had     resolved prior to discharge and Cardiology had placed an ICD to     help prevent further episodes.  In addition, the patient has known     atrial fibrillation, which continued throughout the     hospitalization.  The patient was able to be rate controlled with     final regimen being metoprolol, Cozaar, and amiodarone, which is     being slowly decreased per Cardiology.  The patient will take 400     mg p.o. b.i.d. for 1 week, decreasing it to 200 mg b.i.d. for 2     weeks, and then will decrease to 200 mg daily.  In addition, the     patient was continued on her Coumadin.  At times her Coumadin was     held and IV heparin was started for procedures, however, the     patient did go home with a therapeutic INR.  With the patient's new     diagnosis of CHF, it was found that her ejection fraction was 15%.     This was a significant change from her echo in 2004 showing     ejection fraction of 45-50%.  In addition, a cardiac     catheterization in 2007 showed nonobstructive disease, which is not     that unchanged from the catheterization during this hospitalization     with the exception of significantly more wall motion abnormalities     at this time.   For the patient's CHF, she is to continue     metoprolol, Cozaar, Aldactone.  We did not send the patient home on     Lasix as her blood pressures were in the low normal range, in the     low 100-110 on the day prior to discharge.  Rate control at this  time is more important than starting Lasix and we do not want to     decrease any of her rate controlling medications until she has seen     cardiology as an outpatient.  The patient was instructed to record     daily weights and to inform her primary care provider if she did     begin consistently gaining weights as this could be due to her CHF     and fluid retention.  This will be reassessed as needed. 2. Pulmonary:  The patient with known asthma with or without a COPD     component.  Initially on admission, there was a question of a     pneumonia.  Avelox was discontinued after the patient coded with     ventricular tachycardia as Avelox can cause QT prolongation.  The     patient was continued on steroids for this asthma/COPD     exacerbation.  However, it is thought that most of this patient's     difficulty breathing was secondary to pulmonary edema from CHF.     The patient did require a steroid taper as she received Solu-Medrol     burst,  while in the cardiac ICU.  The patient's chest x-ray did     clear this questionable pneumonia without a full antibiotic     treatment course.  Therefore, this was unlikely to be a continued     or recurrent pneumonia.  The patient does have known pneumonia that     started in October 2011.  On the day of discharge, the patient was     on room air, using her CPAP at night for obstructive sleep apnea.     The patient was instructed to continue her Pulmicort, Singulair,     and Xopenex.  In addition, the patient may need PFTs as an     outpatient as she is on amiodarone, which has known pulmonary     fibrosis effects. 3. Diabetes.  The patient's CBGs were somewhat difficult to control      while she was on steroids.  Prior to discharge, the patient's     metformin was discontinued secondary to possibility of increased     complications with all of her cardiac and pulmonary issues.  The     patient was restarted on her home glipizide of 5 mg p.o. b.i.d.     The patient was also sent home with Lantus 15 units at bedtime as     this is thought to be a decent regimen for the patient.  She was     given diabetic teaching prior to discharge for self-injections;     however, this again is something that can be readdressed by the     patient's PCP as the patient did not seem particularly excited to     need to be doing daily injections. 4. Hyperlipidemia.  The patient's fasting lipid panel was excellent.     The patient was continued on her home statin. 5. Temporary leukocytosis.  The patient did have an increase in her     WBC count; however, this was during her steroid burst and taper, it     was thought to be secondary to steroids as it resolved as she was     weaned off the prednisone. 6. Depression.  The patient did appear to have some depressed affect     and tearfulness.  While in-house, she was started on Wellbutrin 150  mg p.o. daily.  This can be increased to 300 mg p.o. daily at the     patient's next PCP visit.  The patient appeared to be tolerating     Wellbutrin well, denied suicidal or homicidal ideation or thoughts     of wanting to hurt herself on the day of discharge. 7. GERD.  The patient was on Protonix while in-house and was     instructed to continue her home Nexium on discharge, no acute     changes were made for this problem.  FOLLOWUP APPOINTMENTS: 1. Dr. Johney Frame for ICD wound visit on January 30 at 12 noon. 2. Dr. Clarene Duke.  Dr. Fredirick Maudlin office will call with an appointment for     cardiac followup. 3. Warfarin clinic on January 30 at 9:30 a.m. for INR check. 4. Dr. Janalyn Harder on February 10 at 1:30 p.m.  Issues for Dr. Janalyn Harder to consider     following up on  include the patient's heart rate and blood     pressures in addition to whether or not the patient requires home     Lasix or more appropriately, if the patient's blood pressures would     tolerate home Lasix.  In addition, the patient was instructed to     record her weights, this is something that Dr. Janalyn Harder could review with     the patient.  Pulmonary wise, the patient will likely need PFTs as     she is now on amiodarone.  The patient's breathing was greatly     improved on discharge and I would not expect any focal findings.     The patient's CBGs should be followed up on by Dr. Janalyn Harder at this     followup appointment as well as and home regimen could be tweaked     from the glyburide 5 mg b.i.d. with Lantus at bedtime to other oral     treatment.  Dr. Janalyn Harder should also follow up on the patient's     depression and if she is tolerating Wellbutrin.  If she is     tolerating Wellbutrin at that time, it could be increased to 300     mg.  DISCHARGE CONDITION:  The patient was discharged home in stable medical condition with [facility] home [facility] for Home Health RN, PT, and OT.    ______________________________ Demetria Pore, MD   ______________________________ Pearlean Brownie, M.D.    JM/MEDQ  D:  05/09/2010  T:  05/09/2010  Job:  [id number]  cc:   Angeline Slim, MD [facility]  Electronically Signed by Demetria Pore MD on 05/13/2010 07:19:52 PM Electronically Signed by Pearlean Brownie M.D. on 05/28/2010 04:52:19 PM

## 2010-05-29 ENCOUNTER — Ambulatory Visit (INDEPENDENT_AMBULATORY_CARE_PROVIDER_SITE_OTHER): Payer: Medicare Other | Admitting: Family Medicine

## 2010-05-29 ENCOUNTER — Other Ambulatory Visit: Payer: Self-pay | Admitting: Family Medicine

## 2010-05-29 ENCOUNTER — Encounter: Payer: Self-pay | Admitting: Family Medicine

## 2010-05-29 VITALS — BP 110/75 | HR 58 | Temp 97.7°F | Ht 66.0 in | Wt 195.0 lb

## 2010-05-29 DIAGNOSIS — F419 Anxiety disorder, unspecified: Secondary | ICD-10-CM | POA: Insufficient documentation

## 2010-05-29 DIAGNOSIS — I509 Heart failure, unspecified: Secondary | ICD-10-CM

## 2010-05-29 DIAGNOSIS — F411 Generalized anxiety disorder: Secondary | ICD-10-CM

## 2010-05-29 DIAGNOSIS — E119 Type 2 diabetes mellitus without complications: Secondary | ICD-10-CM

## 2010-05-29 LAB — CONVERTED CEMR LAB
BUN: 29 mg/dL — ABNORMAL HIGH (ref 6–23)
Chloride: 98 meq/L (ref 96–112)
Glucose, Bld: 202 mg/dL — ABNORMAL HIGH (ref 70–99)
Potassium: 4.4 meq/L (ref 3.5–5.3)

## 2010-05-29 LAB — BASIC METABOLIC PANEL
CO2: 28 mEq/L (ref 19–32)
Glucose, Bld: 202 mg/dL — ABNORMAL HIGH (ref 70–99)
Potassium: 4.4 mEq/L (ref 3.5–5.3)
Sodium: 136 mEq/L (ref 135–145)

## 2010-05-29 MED ORDER — INSULIN PEN NEEDLE 31G X 8 MM MISC: 1.0000 "application " | Freq: Two times a day (BID) | Status: DC

## 2010-05-29 MED ORDER — BUPROPION HCL ER (XL) 150 MG PO TB24: 150.0000 mg | ORAL_TABLET | Freq: Every day | ORAL | Status: DC

## 2010-05-29 NOTE — Patient Instructions (Signed)
Please make an appointment 1 month from now for follow up of heart failure. I will call you with lab results.  Take all your medications as directed.

## 2010-05-29 NOTE — Assessment & Plan Note (Signed)
Pt was previously on Metformin 1000mg  bid and Glipizide 5mg  bid.  Her last A1C in 12/2009 was 6.8%.  While hospitalized in 04/2010 she had elevated CBG in the 200s and Lantus was added.  Currently she is on Lantus 15 units qhs and Glipizide 5mg  bid.  She states that CBGs are 120s-150s.  She wants to know when she can stop using insulin.  We dicussed getting an A1C today to determine what her diabetes status is.  It is likely that she will need Lantus for a a while.    A1C is 8.3% today.  This is much changed from last A1C of 6.8%.  Will not make any changes to meds.

## 2010-05-29 NOTE — Progress Notes (Signed)
  Subjective:    Patient ID: [name], female    DOB: [birth year], 60 y.o.   MRN: [id number]  HPI CONGESTED HEART FAILURE Pt was hospitalized in Jan for vague complaints and found to be in heart failure with volume overload.  She had an Echo 04/2010 that showed EF 35% to 40%.  Mitral valve: Calcified annulus. Mild to moderate regurgitation. Tricuspid valve: Moderate regurgitation.  Pulmonary arteries: Systolic pressure was moderately increased.  Her previous echo was in 2004 when EF was 50-55%.  She was given aggressive diuresis during hospitalization and discharged home with Torsemide 20mg  daily.  She denies any chest pain, dyspnea on exertion, palpitations, syncope, lower extremity edema, fatigue, weight gain, cough.   ANXIETY She has a history of anxiety/depression that was untreated for years.  At one time she was treated with Wellbutrin.  While she was hospitalized Wellbutrin was restarted.  She has since seen Dr Majel Homer for f/u and reported having some nightmares and he stopped the medication.  Today she is asking for refill of Wellbutrin.  She denies nightmares.  She states that Wellbutrin helps her to deal with all her current conditions.  She feels more balanced with Wellbutrin.  Denies SI/HI  CHRONIC DIABETES Disease Monitoring  Blood Sugar Ranges: 120s-150s.  Last A1C 9/11 6.8%  Polyuria:no   Visual problems:no  Medication Compliance: Lantus 15 units, Glipizide 5mg  bid  Medication Side Effects  Hypoglycemia: no   Preventitive Health Care  Diet pattern: Since hospitalization she has lost 20 lbs (some from fluid loss)       Review of Systems    Per hpi  Objective:   Physical Exam GEN: NAD, appropriate, cooperative throughout exam HEENT: [place]/AT, EOMI, MMM, No neck masses RESP: CTA b/l, NO w/r/r CVS: RRR, No murmur, no bruit ABD: obese.  NT/ND.  +BS Pulses: +2 DP pulses EXT: No c/c/edema NEURO: nonfocal PSYCH: AOX3, No SI/HI        Assessment & Plan:

## 2010-05-29 NOTE — Assessment & Plan Note (Signed)
Pt would like to resume Wellbutrin XL 150mg  daily.  We discussed that at last ov with Dr Louanne Belton pt reported nightmares, leading Dr Louanne Belton to d/c the med.  Pt does not recall this.  We will resume Wellbutrin today.  Discussed that pt is to call and come back for ov if she cannot tolerate this medication.  Pt agreeable to this plan.

## 2010-05-29 NOTE — Assessment & Plan Note (Signed)
Echo 04/2010 with EF 35-40%.  She is taking Torsemid 20mg  daily. Pt is euvolemic on exam.  Weight today is 195 lbs.  Pt's previous baseline weight was 211-213 lbs.  It may be that her new baseline is 195 lbs.  BP is stable with Losartan 50mg .  Will check electrolytes today as she is taking spironolactone 12.5mg  daily.  She is also taking digoxin and her dig level was mildly elevated on discharge.  Will check dig level today.

## 2010-06-04 NOTE — Cardiovascular Report (Signed)
Summary: Office Visit   Office Visit   Imported By: Roderic Ovens 05/27/2010 15:44:33  _____________________________________________________________________  External Attachment:    Type:   Image     Comment:   External Document

## 2010-06-04 NOTE — Procedures (Signed)
Summary: [email or web address]      Allergies Added:   Current Medications (verified): 1)  Budesonide 0.25 Mg/65ml Susp (Budesonide) .Marland Kitchen.. 1 Vial Via Hhn Two Times A Day  Brush/rinse/gargle After Use 2)  Cozaar 50 Mg Tabs (Losartan Potassium) .Marland Kitchen.. 1 Tablet  By Mouth Daily Daily 3)  Coumadin 5 Mg Tabs (Warfarin Sodium) .... Take As Directed 4)  Flonase 50 Mcg/act Susp (Fluticasone Propionate) .... Spray 1 Spray Into Both Nostrils Twice A Day 5)  Nexium 40 Mg Cpdr (Esomeprazole Magnesium) .... One By Mouth Daily 6)  Cpap 11cmh20 .... Wear At Bedtime 7)  Pravastatin Sodium 20 Mg Tabs (Pravastatin Sodium) .Marland Kitchen.. 1 Once Daily 8)  Diabetic Shoes .... Wear Regularly 9)  Glucotrol 5 Mg Tabs (Glipizide) .... One Tablet By Mouth Twice A Day For Diabetes 10)  Ferrous Sulfate 325 (65 Fe) Mg Tabs (Ferrous Sulfate) .Marland Kitchen.. 1 Tab By Mouth Two Times A Day For Iron Deficiency 11)  Xopenex Hfa 45 Mcg/act Aero (Levalbuterol Tartrate) .... Inhale 2 Puff Using Inhaler Every Four Hours As Needed 12)  Amiodarone Hcl 400 Mg Tabs (Amiodarone Hcl) .... Take One Two Times A Day Until Jan 31, Then 1/2 Two Times A Day 13)  Wellbutrin Sr 150 Mg Xr12h-Tab (Bupropion Hcl) .... Take One Every Morning For Mood 14)  Lantus 100 Unit/ml Soln (Insulin Glargine) .Marland Kitchen.. 15 Units Subq At Bedtime 15)  Metoprolol Tartrate 100 Mg Tabs (Metoprolol Tartrate) .... One Bid 16)  Singulair 10 Mg Tabs (Montelukast Sodium) .... One Daily 17)  Miralax  Powd (Polyethylene Glycol 3350) .Marland Kitchen.. 17gm Daily 18)  Promethazine Hcl 12.5 Mg Tabs (Promethazine Hcl) .Marland Kitchen.. 1-2 Tablets Q4h As Needed Nausea 19)  Spironolactone 25 Mg Tabs (Spironolactone) .... 1/2 Tablet Daily  Allergies (verified): 1)  Ace Inhibitors   ICD Specifications Following MD:  Hillis Range, MD     ICD Vendor:  Medtronic     ICD Model Number:  [id number]     ICD Serial Number:  [id number] ICD DOI:  05/02/2010     ICD Implanting MD:  Hillis Range, MD  Lead 1:    Location: RA     DOI: 05/02/2010     Model  #: 0865     Serial #: [id number]     Status: active Lead 2:    Location: RV     DOI: 05/02/2010     Model #: 8413     Serial #: [id number]     Status: active  Indications::  NICM Long QT   ICD Follow Up Battery Voltage:  3.23 V     Charge Time:  8.4 seconds     Underlying rhythm:  SB @ 44   ICD Device Measurements Atrium:  Amplitude: 3.5 mV, Impedance: 513 ohms, Threshold: 0.50 V at 0.40 msec Right Ventricle:  Amplitude: 15.1 mV, Impedance: 456 ohms, Threshold: 0.50 V at 0.40 msec Shock Impedance: 71 ohms   Episodes MS Episodes:  684     Percent Mode Switch:  27.6%     Shock:  0     ATP:  0     Nonsustained:  0     Atrial Therapies:  0 Atrial Pacing:  67.9%     Ventricular Pacing:  7.0%  Brady Parameters Mode MVP     Lower Rate Limit:  60     Upper Rate Limit 130 PAV 180     Sensed AV Delay:  150  Tachy Zones VF:  222     Next Cardiology Appt Due:  08/12/2010 Tech Comments:  WOUND CHECK---STERI STRIPS REMOVED.  NO REDNESS OR SWELLING AT SITE. 684 AF EPISODES--LONGEST WAS 3 HOURS. TOTAL TIME 27.6% OF TIME IN AF.  +COUMADIN.  NORMAL DEVICE FUNCTION.  TURNED ON 1:1 SVT. DEMONSTRATED TONE FOR PT.  ENROLLED PT IN [facility].  ROV IN 3 MTHS W/JA. Vella Kohler  May 25, 2010 3:06 PM

## 2010-06-11 ENCOUNTER — Telehealth: Payer: Self-pay | Admitting: Family Medicine

## 2010-06-11 NOTE — Telephone Encounter (Signed)
OK to add 2 additional weeks of home health PT.[name], [name]

## 2010-06-11 NOTE — Telephone Encounter (Signed)
Following pt for last 4 weeks, making progress & is requesting additional 2 weeks for home health PT.

## 2010-06-12 ENCOUNTER — Encounter: Payer: Self-pay | Admitting: Critical Care Medicine

## 2010-06-12 ENCOUNTER — Ambulatory Visit (INDEPENDENT_AMBULATORY_CARE_PROVIDER_SITE_OTHER): Payer: Medicare Other | Admitting: Critical Care Medicine

## 2010-06-12 DIAGNOSIS — J45909 Unspecified asthma, uncomplicated: Secondary | ICD-10-CM

## 2010-06-19 NOTE — Assessment & Plan Note (Addendum)
Summary: Pulmonary OV   Copy to:  Dr. Shan Levans Primary Provider/Referring Provider:  Cat Ta MD  CC:  Follow up.  Pt states breathing is "fine."  Denies SOB, wheezing, chest tightness, and cough.  No complaints.  .  History of Present Illness: 60  year-old, African-American  female, history of moderate persistent asthma and reflux disease.  Hx of VCD and cyclic cough.  Hx GERD  Non ischemic CM s/p Defib placement 2012 wiht CHF   January 21, 2010 --Presents for an acute office visit. Complains of  Increased sob and wheezing since last night. Complains she had trouble sleeping due to wheezing. Has on/off again wheezing at night for 3 weeks. Has occasional  productive  cough with thick white mucus. with  chest tightness and wheezing. Used  used neb 2 hrs ago. Was seen recently for arthritis flare and leg pain tx w/ steroids. She finished Prednisone 1 week ago for leg pain. Denies chest pain, orthopnea, hemoptysis, fever, n/v/d, edema, headache.   January 28, 2010 10:01 AM Pt feels better and had RLL PNA.  Pt rx with avelox x 7days and is now better. Symptoms are improved. There is less cough and wheeze. Dyspnea is largely better.  No other issues noted CXR today is better   February 05, 2010 10:34 AM  The pt started wiht more cough and awakens from sleep dyspneic.    Notes some white and green mucus.  No real sinus congestion Notes more dyspnea.  No fever except at night.  Notes sweats at night.  Sl heartburn into back and worse with coughing. The pt was better from RML PNA two weeks ago with CXR clearance ,  now worse.  February 14, 2010 11:31 AM cough is better.  no wheeze.  no sleep issues.  No discolored mucus.  March 03, 2010 --Presents for an acute office visit. Complains of prod cough with white mucus, wheezing, increased SOB, hoarseness, body aches/fatigues x1week - states she feels like she has never gotten back to baseline since dx'd with PNA 1 month ago. Last xray showed near  complete resolution of RLL opacity.  Cough is minimally productive, no discoloration , no feer. Feels tired all the time. no energy. cough is not going away. Denies chest pain,  orthopnea, hemoptysis, fever, n/v/d, edema, headache, no weight loss.   March 19, 2010 --Returns with persistent cough and wheezing. She was tx w/ steroid taper and doxcycline.  Underwent CT chest for persisent RLL opacity, CT showed  Small area of clustered peribronchovascular nodularity in the right upper lobe may be infectious or post infectious in etiology.,   Right middle lobe scarring.  Returns today with no significant improvement.  cough meds help  but restart after it wears off. No discolored mucus, no fever. Denies chest pain,  orthopnea, hemoptysis, fever, n/v/d, edema, headache.  April 09, 2010 11:56 AM At last ov the pt was seen by NP and given a complex cough protocol Now is better.  Pt stays tired if walks.   Now not much cough. Does feel like has PN drip.  Has problem sleeping at night. Pt is dyspneic if walks.  Not breathing as deeply.   June 12, 2010 3:16 PM Since last ov pt was admitted for CHF exac 1/27 - 1/31 Since last ov is better. Pt had PT in the home and helps.  Now no cough. No edema in the feet.  Had fluid removed. No real chest pain.  Defib placed 1/12   .  Current Medications (verified): 1)  Budesonide 0.25 Mg/66ml Susp (Budesonide) .Marland Kitchen.. 1 Vial Via Hhn Two Times A Daybrush/rinse/gargle After Use As Needed 2)  Cozaar 50 Mg Tabs (Losartan Potassium) .Marland Kitchen.. 1 Tablet  By Mouth Daily Daily 3)  Coumadin 5 Mg Tabs (Warfarin Sodium) .... Take As Directed 4)  Flonase 50 Mcg/act Susp (Fluticasone Propionate) .... Spray 1 Spray Into Both Nostrils Twice A Day 5)  Nexium 40 Mg Cpdr (Esomeprazole Magnesium) .... One By Mouth Daily 6)  Cpap 11cmh20 .... Wear At Bedtime 7)  Pravastatin Sodium 20 Mg Tabs (Pravastatin Sodium) .Marland Kitchen.. 1 Once Daily 8)  Diabetic Shoes .... Wear Regularly 9)  Glucotrol 5  Mg Tabs (Glipizide) .... One Tablet By Mouth Twice A Day For Diabetes 10)  Ferrous Sulfate 325 (65 Fe) Mg Tabs (Ferrous Sulfate) .Marland Kitchen.. 1 Tab By Mouth Two Times A Day For Iron Deficiency 11)  Xopenex Hfa 45 Mcg/act Aero (Levalbuterol Tartrate) .... Inhale 2 Puff Using Inhaler Every Four Hours As Needed 12)  Amiodarone Hcl 400 Mg Tabs (Amiodarone Hcl) .... Take 1 Tablet By Mouth Once A Day 13)  Wellbutrin Sr 150 Mg Xr12h-Tab (Bupropion Hcl) .... Take One Every Morning For Mood 14)  Lantus 100 Unit/ml Soln (Insulin Glargine) .Marland Kitchen.. 15 Units Subq At Bedtime 15)  Lopressor 50 Mg Tabs (Metoprolol Tartrate) .... Take 1 Tablet By Mouth Two Times A Day 16)  Singulair 10 Mg Tabs (Montelukast Sodium) .... One Daily 17)  Miralax  Powd (Polyethylene Glycol 3350) .Marland Kitchen.. 17gm Daily 18)  Promethazine Hcl 12.5 Mg Tabs (Promethazine Hcl) .Marland Kitchen.. 1-2 Tablets Q4h As Needed Nausea 19)  Spironolactone 25 Mg Tabs (Spironolactone) .... 1/2 Tablet Daily 20)  Digoxin 0.125 Mg Tabs (Digoxin) .... 1/2 Tablet Once Daily 21)  Famotidine 40 Mg Tabs (Famotidine) .... Take 1 Tablet By Mouth Two Times A Day 22)  Torsemide 10 Mg Tabs (Torsemide) .... Take 1 Tablet By Mouth Once A Day  Allergies (verified): 1)  Ace Inhibitors  Past History:  Past medical, surgical, family and social histories (including risk factors) reviewed, and no changes noted (except as noted below).  Past Medical History: UPDATED 10/30/2008 after review of old paper chart dating back to late 1990s  H. pylori negative 07/2001, Menopause at 60 y/o, on CPAP at night, sinus bradycardia with arrythmia noted on 01/21/01  #DISEASE, VOCAL CORD NEC (ICD-478.5)............DR Delford Field >mentioned in chart > Was followed by Dr. Annalee Genta ENT in late 1990s - lary showed findings c/w GERD > Was also followed by Speech Clinic at [facility]. VCD suspected there. s/p speech therap in late 1990s  ACEi Intolerant OBESITY, NOS (ICD-278.00) LEG PAIN OR KNEE PAIN  (ICD-729.5) HYPERTENSION, BENIGN SYSTEMIC (ICD-401.1) HYPERCHOLESTEROLEMIA (ICD-272.0) GASTROESOPHAGEAL REFLUX, NO ESOPHAGITIS (ICD-530.81) DIABETES MELLITUS II, UNCOMPLICATED (ICD-250.00) DEPRESSION, MAJOR, RECURRENT (ICD-296.30) Non ischemic Cardiomyopathy     >EF 15-20%    >defib placement 1/12.    > Cath 04/2010:      The ejection fraction was 15- 20%      No CAD ATRIAL FIBRILLATION (ICD-427.31)................[facility], Dr Clarene Duke >PAF. s/p ablation >s/p pulmonary vein isolation 11/08/08 >Followed by [facility] ASTHMA, UNSPECIFIED (ICD-493.90)....................DR Delford Field >charts mention "moderare persistent" severity.  >Last PFT was late 1990s per patient hx but in review  of paper chart Dr Marchelle Gearing could not find it july 2010 >Frequent exacerbagtions needing admits late 1990s >Positive skin testing for allergies - 1998 by Dr Sidney Ace >Normal spirometry on advair/singulair - july 2010 while symptomatic  #  APNEA, SLEEP (ICD-780.57)....Dr. Vassie Loll  Past Surgical History: Reviewed history from 05/06/2010 and no changes required. -Catherization 04/2010: 1. No occlusive coronary disease. 2. Severe LV dysfunction with EF 15-20%. -Medtronic Protecta XT ICD, Dr Johney Frame, 1//2012, for A fib -Colonoscopy 06/2009: Pandiverticulosis. Dr Loreta Ave -s/p pulmonary vein isolation 11/08/08 at [facility] -ABI = 1.99 - 03/31/2001,  -Cardiac cath - 08/11/2005, Cardiac Cath- N Ef, mild CAD - 04/13/2002,  Cardiolite - scar at apical/lat wall, pos ischemia ant/anteroseptal wall - 07/20/2005,  -Cholecystectomy -,  -Flex sigmoid - nl - 06/02/2005,  -Nissen Fundoplication 1/98 -,  -R Breast Bx - sclerosing adenosis 5/93 -,  -R Knee Arthroscopy - 10/12/1998, Sleep study -[facility] - 04/13/2004  Family History: Reviewed history from 06/10/2006 and no changes required. Father is deceased at 48 yo with DM and HTN., Mom died during a hysterectomy d/t a bleeding complication.  Social History: Reviewed history from  06/10/2006 and no changes required. Husband died 11-16-2022, lives w/ Daughter-Diana Wampole. Nonsmoker, doesn't drink.  Involved in non-denominational [employer]. Not working  Review of Systems       The patient complains of shortness of breath with activity and non-productive cough.  The patient denies shortness of breath at rest, productive cough, coughing up blood, chest pain, irregular heartbeats, acid heartburn, indigestion, loss of appetite, weight change, abdominal pain, difficulty swallowing, sore throat, tooth/dental problems, headaches, nasal congestion/difficulty breathing through nose, sneezing, itching, ear ache, anxiety, depression, hand/feet swelling, joint stiffness or pain, rash, change in color of mucus, and fever.    Vital Signs:  Patient profile:   60 year old female Height:      66 inches Weight:      199 pounds BMI:     32.24 O2 Sat:      99 % on Room air Temp:     98.4 degrees F oral Pulse rate:   59 / minute BP sitting:   140 / 90  (left arm) Cuff size:   regular  Vitals Entered By: Gweneth Dimitri RN (June 12, 2010 3:01 PM)  O2 Flow:  Room air CC: Follow up.  Pt states breathing is "fine."  Denies SOB, wheezing, chest tightness, cough.  No complaints.   Comments Medications reviewed with patient Daytime contact number verified with patient. Gweneth Dimitri RN  June 12, 2010 3:02 PM    Physical Exam  Additional Exam:  GEN: A/Ox3; pleasant , NAD HEENT:  [place]/AT, , EACs-clear, TMs-wnl, NOSE-clear, THROAT-clear NECK:  Supple w/ fair ROM; no JVD; normal carotid impulses w/o bruits; no thyromegaly or nodules palpated; no lymphadenopathy. RESP  Coarse BS w/  improved upper airway psuedowheezing  CARD:  RRR, no m/r/g   GI:   Soft & nt; nml bowel sounds; no organomegaly or masses detected. [place]: Warm bil,  no calf tenderness edema, clubbing, pulses intact Neuro: intact w/ no focal deficitis detected.    Impression & Recommendations:  Problem # 1:  CONGESTIVE  HEART FAILURE, SEVERE (ICD-428.0) Assessment Improved severe chf with non ischemic CM s/p defib placement 2012 plan  per cardiology Her updated medication list for this problem includes:    Cozaar 50 Mg Tabs (Losartan potassium) .Marland Kitchen... 1 tablet  by mouth daily daily    Coumadin 5 Mg Tabs (Warfarin sodium) .Marland Kitchen... Take as directed    Lopressor 50 Mg Tabs (Metoprolol tartrate) .Marland Kitchen... Take 1 tablet by mouth two times a day    Spironolactone 25 Mg Tabs (Spironolactone) .Marland Kitchen... 1/2 tablet daily    Digoxin 0.125 Mg  Tabs (Digoxin) .Marland Kitchen... 1/2 tablet once daily    Torsemide 10 Mg Tabs (Torsemide) .Marland Kitchen... Take 1 tablet by mouth once a day  Problem # 2:  ASTHMA, PERSISTENT (ICD-493.90) Assessment: Improved stable persistent asthma plan no change inhaled meds except stop singulair  Problem # 3:  GASTROESOPHAGEAL REFLUX, NO ESOPHAGITIS (ICD-530.81) Assessment: Unchanged stabler gerd plan  d/c pepcid cont nexium The following medications were removed from the medication list:    Famotidine 40 Mg Tabs (Famotidine) .Marland Kitchen... Take 1 tablet by mouth two times a day Her updated medication list for this problem includes:    Nexium 40 Mg Cpdr (Esomeprazole magnesium) ..... One by mouth daily  Medications Added to Medication List This Visit: 1)  Budesonide 0.25 Mg/70ml Susp (Budesonide) .Marland Kitchen.. 1 vial via hhn two times a daybrush/rinse/gargle after use as needed 2)  Amiodarone Hcl 400 Mg Tabs (Amiodarone hcl) .... Take 1 tablet by mouth once a day 3)  Lopressor 50 Mg Tabs (Metoprolol tartrate) .... Take 1 tablet by mouth two times a day 4)  Digoxin 0.125 Mg Tabs (Digoxin) .... 1/2 tablet once daily 5)  Famotidine 40 Mg Tabs (Famotidine) .... Take 1 tablet by mouth two times a day 6)  Torsemide 10 Mg Tabs (Torsemide) .... Take 1 tablet by mouth once a day  Complete Medication List: 1)  Budesonide 0.25 Mg/51ml Susp (Budesonide) .Marland Kitchen.. 1 vial via hhn two times a daybrush/rinse/gargle after use as needed 2)  Cozaar 50 Mg  Tabs (Losartan potassium) .Marland Kitchen.. 1 tablet  by mouth daily daily 3)  Coumadin 5 Mg Tabs (Warfarin sodium) .... Take as directed 4)  Flonase 50 Mcg/act Susp (Fluticasone propionate) .... Spray 1 spray into both nostrils twice a day 5)  Nexium 40 Mg Cpdr (Esomeprazole magnesium) .... One by mouth daily 6)  Cpap 11cmh20  .... Wear at bedtime 7)  Pravastatin Sodium 20 Mg Tabs (Pravastatin sodium) .Marland Kitchen.. 1 once daily 8)  Diabetic Shoes  .... Wear regularly 9)  Glucotrol 5 Mg Tabs (Glipizide) .... One tablet by mouth twice a day for diabetes 10)  Ferrous Sulfate 325 (65 Fe) Mg Tabs (Ferrous sulfate) .Marland Kitchen.. 1 tab by mouth two times a day for iron deficiency 11)  Xopenex Hfa 45 Mcg/act Aero (Levalbuterol tartrate) .... Inhale 2 puff using inhaler every four hours as needed 12)  Amiodarone Hcl 400 Mg Tabs (Amiodarone hcl) .... Take 1 tablet by mouth once a day 13)  Wellbutrin Sr 150 Mg Xr12h-tab (Bupropion hcl) .... Take one every morning for mood 14)  Lantus 100 Unit/ml Soln (Insulin glargine) .Marland Kitchen.. 15 units subq at bedtime 15)  Lopressor 50 Mg Tabs (Metoprolol tartrate) .... Take 1 tablet by mouth two times a day 16)  Miralax Powd (Polyethylene glycol 3350) .Marland Kitchen.. 17gm daily 17)  Promethazine Hcl 12.5 Mg Tabs (Promethazine hcl) .Marland Kitchen.. 1-2 tablets q4h as needed nausea 18)  Spironolactone 25 Mg Tabs (Spironolactone) .... 1/2 tablet daily 19)  Digoxin 0.125 Mg Tabs (Digoxin) .... 1/2 tablet once daily 20)  Torsemide 10 Mg Tabs (Torsemide) .... Take 1 tablet by mouth once a day  Other Orders: Est. Patient Level IV (19147)  Patient Instructions: 1)  Stop Singulair 2)  Stop famotidine 3)  Stay on Nexium 4)  No other medication changes 5)  Return 3 months [facility]

## 2010-06-19 NOTE — Telephone Encounter (Signed)
Left message for [name] at [facility] to return call. See below message.Doroteo Nickolson, [name]

## 2010-06-19 NOTE — Telephone Encounter (Signed)
Gave message to [facility] about verbal OK, patient is now wanting referral to go to OP Rehab after this to continue therapy.

## 2010-06-19 NOTE — Telephone Encounter (Signed)
If PT feels that pt should be referred to OP Rehab then [facility] PT needs to send me information regarding this. I would like communication with PT regarding pt's progress and why she needs to go to PO Rehab.

## 2010-06-19 NOTE — Telephone Encounter (Signed)
If PT feels that pt should be referred to OP Rehab then [facility] PT needs to send me information regarding this. I would like communication with PT regarding pt's progress and why she needs to go to PO Rehab.    

## 2010-06-19 NOTE — Telephone Encounter (Signed)
Forward to Ta for review 

## 2010-06-20 ENCOUNTER — Encounter: Payer: Self-pay | Admitting: Family Medicine

## 2010-06-20 NOTE — Telephone Encounter (Signed)
This encounter was created in error - please disregard.

## 2010-06-20 NOTE — Telephone Encounter (Signed)
Spoke with Maurine Minister form [facility], he will fax information to you regarding rehab for patient.

## 2010-06-27 ENCOUNTER — Ambulatory Visit (INDEPENDENT_AMBULATORY_CARE_PROVIDER_SITE_OTHER): Payer: Medicare Other | Admitting: Family Medicine

## 2010-06-27 ENCOUNTER — Encounter: Payer: Self-pay | Admitting: Family Medicine

## 2010-06-27 DIAGNOSIS — I509 Heart failure, unspecified: Secondary | ICD-10-CM

## 2010-06-27 DIAGNOSIS — I10 Essential (primary) hypertension: Secondary | ICD-10-CM

## 2010-06-27 DIAGNOSIS — E119 Type 2 diabetes mellitus without complications: Secondary | ICD-10-CM

## 2010-06-27 MED ORDER — DIGOXIN 125 MCG PO TABS: ORAL_TABLET | ORAL | Status: DC

## 2010-06-27 MED ORDER — POLYETHYLENE GLYCOL 3350 17 G PO PACK: 17.0000 g | PACK | Freq: Every day | ORAL | Status: DC

## 2010-06-27 MED ORDER — TORSEMIDE 10 MG PO TABS: 20.0000 mg | ORAL_TABLET | Freq: Every day | ORAL | Status: DC

## 2010-06-27 MED ORDER — METOPROLOL TARTRATE 50 MG PO TABS: 50.0000 mg | ORAL_TABLET | Freq: Two times a day (BID) | ORAL | Status: DC

## 2010-06-27 NOTE — Patient Instructions (Signed)
Please come back in one month for heart and diabetes. I will call [facility] about Physical Therapy. I will fax labs to Dr Clarene Duke.

## 2010-06-27 NOTE — Progress Notes (Signed)
  Subjective:    Patient ID: [name], female    DOB: 1951-02-16, 60 y.o.   MRN: [id number]  HPI  Diabetes Mellitus: Patient presents for follow up of diabetes. Symptoms: none. Symptoms have stabilized and been well-controlled. Patient denies foot ulcerations, hypoglycemia , nausea, polydipsia, polyuria and vomitting.  Evaluation to date has been included: hemoglobin A1C.  Last A1C 8.3 in 05/2010.  Home sugars: BGs consistently in an acceptable range. 120s-130s are fasting CBGs at home.   Treatment to date: Continued insulin which has been effective, Continued sulfonylurea which has been effective, Continued statin which has been effective and Continued ACE inhibitor/ARB which has been effective.   Pt does not want to continue insulin.  She keeps asking to continue insulin and resume metformin.  Insulin was added when pt was in the hospital when CBGs were in the 200s.    Hypertension: Patient here for follow-up of elevated blood pressure. She is exercising and is adherent to low salt diet.  Blood pressure is well controlled at home. Cardiac symptoms none. Patient denies chest pain, chest pressure/discomfort, claudication, dyspnea, exertional chest pressure/discomfort, fatigue, orthopnea and syncope.  Cardiovascular risk factors: hyperlipidemia, diabetes mellitus and obesity (BMI >= 30 kg/m2). Use of agents associated with hypertension: none. History of target organ damage: heart failure.  Heart Failure: Pt is taking Torsemide 10mg  daily.  Since admission weight has ranged from 195 lbs - 199lbs. Pt is weighing herself daily. She is abstaining from salt consumption.  She is limiting water intake.  Denies dyspnea, weight gain, LE edema, orthopnea.   Review of Systems Per hpi     Objective:   Physical Exam GEN: well developed, alert, cooperative. Vitals reviewed. HEENT: [place]/AT, EOMI, MMM, No cervical nodes, neck full ROM CVS: RRR, no murmurs.  No jvd.  No bruit. RESP: CTA b/l. No w/r/r ABD: NT/ND,  BS present EXT: No edema, cyanosis.  +2 pulses. Neuro: nonfocal     Assessment & Plan:

## 2010-06-29 ENCOUNTER — Encounter: Payer: Self-pay | Admitting: Family Medicine

## 2010-06-29 NOTE — Assessment & Plan Note (Addendum)
BP 120/76 is at goal of <130/80.  Pt cannot tolerate ACE (cough) so she is on an ARB currently.  Will need to check Bmet at next visit to monitor renal function and electrolytes.  Pt is compliant to a salt-free diet.

## 2010-06-29 NOTE — Assessment & Plan Note (Signed)
Doing well with Torsemide 10mg  daily.  We discussed that her dry weight is currently 195 - 199 lb.  Pt needs to call [facility] or Dr Clarene Duke right away when weight is 203 lb or more.  We will likely increase Torsemide to 15-20mg  daily for a few days, but we would like to know when pt's weight is changing.  Pt to continue with salt-free diet and to limit water intake.  We will check renal function at next visit.

## 2010-06-29 NOTE — Assessment & Plan Note (Signed)
Pt is very resistant to being on insulin.  A1C was well controlled previously on Metformin 1000mg  bid and Glipizide 10mg  bid.  While hosp for new onset heart failure, pt's CBGs were in the 200s and A1C was 8.3 and  Lantus was started.  Given that pt is very concerned and wants to be off of insulin in her fasting CBGs are 120s-130s, I discussed with pt the possibility of going off Lantus.  We will monitor for another month on Lantus.  Pt will continue to record CBGs.  I will review CBG log next month.  Then we can try going back on Metformin 1000mg  bid.  We will compare CBGs on metformin vs CBGs on lantus.  Pt agreeable to this plan.

## 2010-06-30 ENCOUNTER — Telehealth: Payer: Self-pay | Admitting: Family Medicine

## 2010-06-30 NOTE — Telephone Encounter (Signed)
Called [facility] regarding continuing PT at home vs pt going to [facility] rehab for outpt PT.  Pt was d/c from HHPT on 06/20/10 since all goals were met.  I inquired to see if pt can continue with HHPT since her she is not driving at this time and her daughter would have to drive her to PT.  [facility] will look into this and call me back.

## 2010-06-30 NOTE — Telephone Encounter (Signed)
[facility] called back and stated that because there is no new dx and the only prob pt has is transportation, they will not be able to cont HHPT.  Pt to go to outpt rehab.   Called pt back and informed her to [facility] decision.  Pt acknowledged understanding.

## 2010-07-11 ENCOUNTER — Other Ambulatory Visit: Payer: Self-pay | Admitting: Family Medicine

## 2010-07-11 NOTE — Telephone Encounter (Signed)
Refill request

## 2010-08-01 ENCOUNTER — Encounter: Payer: Self-pay | Admitting: Family Medicine

## 2010-08-01 ENCOUNTER — Ambulatory Visit (INDEPENDENT_AMBULATORY_CARE_PROVIDER_SITE_OTHER): Payer: Medicare Other | Admitting: Family Medicine

## 2010-08-01 VITALS — BP 124/81 | HR 59 | Temp 98.2°F | Ht 66.0 in | Wt 199.0 lb

## 2010-08-01 DIAGNOSIS — E119 Type 2 diabetes mellitus without complications: Secondary | ICD-10-CM

## 2010-08-01 MED ORDER — METOPROLOL TARTRATE 50 MG PO TABS: 50.0000 mg | ORAL_TABLET | Freq: Two times a day (BID) | ORAL | Status: DC

## 2010-08-01 MED ORDER — DIGOXIN 125 MCG PO TABS: ORAL_TABLET | ORAL | Status: DC

## 2010-08-01 MED ORDER — TORSEMIDE 10 MG PO TABS: 20.0000 mg | ORAL_TABLET | Freq: Every day | ORAL | Status: DC

## 2010-08-01 NOTE — Progress Notes (Signed)
  Subjective:    Patient ID: [name], female    DOB: [birth year], 60 y.o.   MRN: [id number]  HPI Pt is here for DIABETES f/u: MEDS: Lantus 15 units daily A1C 8.3 05/29/10 Pt did not bring her log but thinks that her range is: CBGs 102-135 Pt would like to stop using insulin.  She states that she has never had to use insulin before and does not want to use it now. She does not have any difficulty with using the insulin.     Review of Systems  Constitutional: Negative for fever, chills and fatigue.  Respiratory: Negative for apnea, cough, choking, chest tightness and wheezing.   Cardiovascular: Negative for chest pain, palpitations and leg swelling.  Gastrointestinal: Negative for vomiting, diarrhea and constipation.       Objective:   Physical Exam  Constitutional: She appears well-developed and well-nourished. No distress.          Assessment & Plan:

## 2010-08-01 NOTE — Patient Instructions (Signed)
Please follow up with Dr Leveda Anna for back pain and other chronic pain issues. When your pain is better, resume the core exercises that will strengthen your back. Please start taking the polyethylene glycol to have soft bowel movements.  You are taking chronic narcotics which will cause constipation, and straining may worsen your pain. Please do not take any ketorolac today since we gave you and injection of Toradol in the clinic.

## 2010-08-03 ENCOUNTER — Encounter: Payer: Self-pay | Admitting: Family Medicine

## 2010-08-03 NOTE — Assessment & Plan Note (Addendum)
CBGs in the last month 105-135.  A1C 8.2 in 05/2010, of is 7.  Pt is using Lantus 15 units daily.  She would like to discontinue using insulin.  Discussed that pt to rtc in 1 month. She will bring her CBG log at next visit. Discussed with her today that I am not sure that oral hypoglycemics would be a good option for her, with her comorbities (DM, HLD, HTN, CHF) we should have tight control over DM and last A1C was 8.2.  Pt was no satisfied with this.  We will check A1C at next visit.  We will also follow daily fasting CBG.  May consider switching to PO hypoglycemics.  Cr is 1.49, so will need to monitor Cr closely if we go back to Metformin.  May consider Januvia if insurance will cover it.

## 2010-08-04 ENCOUNTER — Other Ambulatory Visit: Payer: Self-pay | Admitting: Critical Care Medicine

## 2010-08-10 ENCOUNTER — Other Ambulatory Visit: Payer: Self-pay | Admitting: Family Medicine

## 2010-08-10 NOTE — Telephone Encounter (Signed)
Refill request

## 2010-08-21 ENCOUNTER — Encounter: Payer: Self-pay | Admitting: Family Medicine

## 2010-08-26 NOTE — H&P (Signed)
NAME:  [name], [name]                 ACCOUNT NO.:  [id number]   MEDICAL RECORD NO.:  [id number]          PATIENT TYPE:  INP   LOCATION:  4739                         FACILITY:  [facility]   PHYSICIAN:  Santiago Bumpers. Hensel, M.D.DATE OF BIRTH:  01/13/1951   DATE OF ADMISSION:  11/20/2007  DATE OF DISCHARGE:                              HISTORY & PHYSICAL   PRIMARY CARE Jasman Murri:  Norton Blizzard, MD at the [facility].   CHIEF COMPLAINT:  Postmenopausal bleeding.   HISTORY OF PRESENT ILLNESS:  This is a 59 year old postmenopausal female  with the history of asthma, atrial fibrillation status post ablation and  on Coumadin therapy, diabetes mellitus type 2, hyperlipidemia,  gastroesophageal reflux disease, sleep apnea, who presented to [facility] Emergency Room with vaginal bleeding this morning.  The patient has  not had any bleeding since menopause at age 9.  Bleeding was constant,  soaked 3 pads prior to presenting in the ED, has not improved.  It is  associated with mild abdominal pain located in lower abdomen and also  radiating between shoulder blades and back.  Denies any changes in  vision, nausea, vomiting, diarrhea, constipation, shortness of breath,  lightheadedness, or chest pain.  No trauma.  No changes in weight or  appetite.  Denies any history of vaginal discharge or burning during  urination or blood in feces, and last sexual intercourse was 5 years  prior.  INR at [facility] Emergency Room was 3.7.   REVIEW OF SYSTEMS:  As above.   CURRENT ALLERGIES:  No known drug allergies.   PAST MEDICAL HISTORY:  1. Hypertension.  2. Hypercholesterolemia.  3. Gastroesophageal reflux disease.  4. Diabetes mellitus, type 2.  5. Depression.  6. Atrial fibrillation.  7. Asthma.  8. Sleep apnea.   PAST SURGICAL HISTORY:  The patient has had a history of multiple  cardiac catheterization that showed normal ejection fractions and mild  coronary artery disease.  Cardiolite  showed a scar in the apical lateral  wall, possible ischemia in the anterior or anteroseptal wall.  The  patient has a history of cholecystectomy.  Flexible sigmoidoscopy was  normal.  Nissen fundoplication on January 1998, a right breast biopsy  that showed sclerosing adenitis in 1993, right knee arthroscopy in 2000,  and a sleep study that showed obstructive sleep apnea on April 13, 2004.   CURRENT MEDICATIONS:  1. Advair Diskus 250/50 mcg 1 puff inhaled b.i.d.  2. Allegra 180 mg 1 tab by mouth once a day.  3. Atacand 16 mg 1 tab p.o. daily.  4. Coumadin 5 mg p.o. daily.  5. Cartia XT 180 mg 1 tab by mouth daily.  6. Flonase 50 mcg 1 spray into both nostrils twice a day.  7. Glucotrol XL 10 mg 1 tab by mouth daily.  8. Nexium 40 mg 1 tab by mouth b.i.d.  9. Singulair 10 mg 1 tab by mouth daily.  10.Xopenex HFA 45 mcg inhale 2 puffs using inhaler every 4 hours.  11.Zocor 40 mg 1 tab p.o. daily.  12.Brovana 1 nebulizer b.i.d.  13.Tikosyn  500 mcg 1 by mouth b.i.d.  14.CPAP 11 cm of H2O nightly.  15.Xopenex 0.63 mg/3 mL nebulized 4 times daily p.r.n.   FAMILY HISTORY:  Father died at age 69, had a history of diabetes and  hypertension.  Mother died during a hysterectomy due to a bleeding  complication.   SOCIAL HISTORY:  Husband died in December 22, 2002.  The patient lives with  daughter.  She is a nonsmoker, does not drink alcohol, Christian, and  not working.   PHYSICAL EXAMINATION:  VITAL SIGNS:  Blood pressure 134/78, pulse 66,  respirations 20, and oxygen saturation 99% on room air.  GENERAL:  Well-developed and well-nourished in no acute distress.  Alert, appropriate, and cooperative throughout examination.  HEENT:  Head was normocephalic and atraumatic without obvious  abnormalities.  Eyes, no corneal or conjunctival inflammation noted.  Extraocular muscles intact.  Pupils are equal, round, and reactive to  light and accommodation.  Vision grossly normal.  The patient  wears  glasses.  No scleral icterus and questionable conjunctival pallor.  Ears, external ear exam showed no significant lesions or deformities.  Nose, external nasal examination shows no deformity or inflammation.  Mouth, moist mucous membranes.  NECK:  No deformities, masses, or tenderness noted.  CHEST WALL:  No deformities, masses, or tenderness noted.  LUNGS:  Normal respiratory effort.  CHEST:  Expands symmetrically.  LUNGS:  Clear to auscultation.  No crackles or wheezes.  HEART:  Normal rate and regular rhythm.  S1 and S2 normal without  gallop, murmur, click, rub, or extra sounds.  ABDOMEN:  Bowel sounds positive.  Abdomen is soft and nontender without  masses, organomegaly, or hernias noted.  GENITALIA:  Per Dr. Rosalia Hammers at [facility] ED.  The patient with normal  vaginal mucosa without lesions, small trickle of blood noted coming from  cervical os.  Cervix without lesions, tenderness, or discharge.  MUSCULOSKELETAL:  No joint swelling.  No redness over joints.  EXTREMITIES:  No clubbing, cyanosis, edema, or deformity noted.  NEUROLOGIC:  Alert and oriented x3.  Cranial nerves II through XII  intact.  Strength is normal in all extremities.  Sensation intact to  light touch and deep tendon reflexes are symmetrical and normal.  SKIN:  No rashes.  No suspicious lesions.  No ecchymosis.  No petechiae.  CERVICAL NODES:  No lymphadenopathy noted.  PSYCHIATRIC:  Cognition and judgment appear intact.  Alert and  cooperative.  Normal attention span and concentration.  No apparent  delusions, illusions, or hallucinations.   LABORATORY DATA:  Hemoglobin of 11.8, platelets of 205, and INR of 3.7.   ASSESSMENT AND PLAN:  This is a 60 year old female with postmenopausal  bleeding.  1. Postmenopausal bleeding.  I suspect that the bleed is a      complication of her supratherapeutic INR.  We will start reversing      her anticoagulation by holding her Coumadin and giving 1 unit of       fresh-frozen plasma.  The patient already received oral vitamin K      at the [facility] ED.  No need for rapid reversal as the bleeding      is minimal and hemoglobin is stable.  We will monitor q.8 h. CBCs      and place the patient on telemetry.  I must, however, rule out      other sources of postmenopausal bleeding.  Differentials include      uterine fibroids, polyps, and endometrial cancer.  We  will do      pelvic ultrasound with infusion of saline into uterus to evaluate      for masses, uterine stripes, or polyps.  We will likely need      outpatient endometrial biopsy.  2. Atrial fibrillation.  I am holding Coumadin, continuing the      Tikosyn.  I will obtain an EKG and place the patient on telemetry      bed.  3. Diabetes mellitus, type 2.  The patient will be on her home meds of      Atacand 16 mg 1 tab p.o. daily and Glucotrol 10 mg 1 tab p.o.      daily.  4. Asthma.  The patient will be placed back on her home meds of Advair      Diskus 250/50 mcg 1 puff b.i.d., Singulair 10 one tab by mouth p.o.      daily, Brovana 1 nebulizer b.i.d., and Xopenex 0.63 mg in 3 mL 1      nebulizer 4 times daily as needed.  5. Sleep apnea.  We will do CPAP at home setting.  6. Gastroesophageal reflux.  The patient will be put back on her home      meds of Nexium 40 mg p.o. b.i.d.  7. Hypercholesterolemia.  The patient will be on her home meds of      Zocor 40 mg p.o. daily.  8. Hypertension.  The patient will be on home meds of Atacand 16 mg 1      tab by mouth daily and Cartia XT 180 mg 1 tab by mouth daily.  9. Fluids, electrolytes, nutrition/gastrointestinal.  The patient will      be placed on a diabetic diet.  IV fluids will be KVO'ed and dispo      will be pending stabilization of bleeding and pelvic ultrasound      result.      Rodney Langton, MD  Electronically Signed      Santiago Bumpers. Leveda Anna, M.D.  Electronically Signed    TT/MEDQ  D:  11/21/2007  T:  11/21/2007   Job:  [id number]

## 2010-08-26 NOTE — Assessment & Plan Note (Signed)
[facility]                             PULMONARY OFFICE NOTE   Diane, [name]                        MRN:          [id number]  DATE:03/15/2007                            DOB:          [birth year]    The patient comes in today with nasal congestion, coughing, mucous is  not discolored.  She has scratchiness in the throat.  Symptoms have been  going on for four days.  Throat is hurting.  She has underlying history  of vocal cord dysfunction syndrome, reflux disease, steroid dependent  asthma, severe sleep apnea.   PHYSICAL EXAMINATION:  VITAL SIGNS:  Temperature 98, blood pressure  106/70, pulse 84, saturation 99% on room air.  CHEST:  Diminished breast sounds with prolonged expiratory phase, no  wheeze or rhonchi noted.  HEART:  Regular rate and rhythm without S3.  Normal S1 and S2.  ABDOMEN:  Soft, nontender.  EXTREMITIES:  No edema or clubbing.  SKIN:  Clear.   IMPRESSION:  Increased bronchitis flare.   PLAN:  Pulse prednisone 40 mg a day with a rapid taper down by 10 mg  over three days until off.  She will receive Doxycycline 100 mg b.i.d.  for 7 days and we will see the patient back in follow-up.     Charlcie Cradle Delford Field, MD, [facility]  Electronically Signed    PEW/MedQ  DD: 03/16/2007  DT: 03/16/2007  Job #: [id number]

## 2010-08-26 NOTE — Discharge Summary (Signed)
NAME:  [name], [name]                 ACCOUNT NO.:  [id number]   MEDICAL RECORD NO.:  [id number]          PATIENT TYPE:  INP   LOCATION:  4739                         FACILITY:  [facility]   PHYSICIAN:  Santiago Bumpers. Hensel, M.D.DATE OF BIRTH:  1950/10/17   DATE OF ADMISSION:  11/20/2007  DATE OF DISCHARGE:  11/21/2007                               DISCHARGE SUMMARY   ATTENDING PHYSICIAN:  Chrissie Noa A. Hensel, MD   ADMISSION DIAGNOSIS:  Vaginal bleeding.   DISCHARGE DIAGNOSIS:  Vaginal bleeding from the cervical os.   This is a 60 year old postmenopausal female with history of asthma,  AFib, status post ablation and on Coumadin therapy, diabetes mellitus  type 2, hyperlipidemia, GERD, and sleep apnea who presented to [facility] ER with vaginal bleeding this month.  The patient had not had any  bleeding since menopause at age 61.  The bleeding was constant, soaked 3  pads prior to presenting in the ED and had not improved, was also  associated with mild abdominal pain located in the lower abdomen and  also radiating between shoulder blades on the back.  Of note, the  patient's INR at [facility] ED was measured at 3.7.   PROCEDURES:  The patient received a pelvic ultrasound with saline  infusion.   The patient received one dose of oral vitamin K at [facility] ED and  then her Coumadin was held on arrival to [facility] and she  received 1 unit of fresh frozen plasma.  Her INR subsequently came down  to 1.8.  Once INR was reduced to 1.8, the patient's bleeding stopped.  Her hemoglobin remained stable at 11.4.   PROBLEMS:  1. Postmenopausal bleeding.  Although the bleeding has stopped here at      [facility], I must rule out other sources of      postmenopausal bleeding including uterine fibroids, uterine polyps,      and endometrial cancer.  The pelvic ultrasound was done prior to      discharge, but the official read will had to be followed up after      discharge.  The  patient is also scheduled for an endometrial biopsy      tomorrow morning at 10:45 a.m. at the [facility] with      Dr. Lloyd Huger.  2. Atrial fibrillation.  We are holding the Coumadin, but we will      continue the patient's Tikosyn.  3. Diabetes mellitus, type 2.  I will discharge the patient home on      Atacand 16 mg and Glucotrol 10 mg.  4. Asthma.  The patient will be discharged on her home medications      with Advair Diskus 250/50 mcg 1 puff twice a day, Singulair 10 mg 1      tablet p.o. daily, Brovana 15 mcg 2 mL 1 neb b.i.d., and Xopenex      0.63 mg in 3 mL one nebulizer treatment 4 times a day as needed.  5. Sleep apnea.  The patient will continue her CPAP at the home  settings.  6. Gastroesophageal reflux disease.  The patient will continue her      home meds, Nexium 40 mg p.o. b.i.d.  7. Hypercholesterolemia.  The patient will continue her home meds,      Zocor 40 mg 1 tablet by mouth daily.  8. Hypertension.  The patient will be continued on her home meds with      Atacand 16 mg p.o. daily and Cartia XT 180 mg p.o. daily, and after      the endometrial biopsy, the patient will follow up with her primary      care Emiko Osorto, Dr. Pearletha Forge in [facility].   DISCHARGE CONDITION:  Stable and improved.   DISPOSITION:  The patient will be discharged home.   DISCHARGE INSTRUCTIONS:  The patient has her instructions to follow up  with [facility] tomorrow at 10 a.m. for INR to be drawn and  for her endometrial biopsy.      Rodney Langton, MD  Electronically Signed      Santiago Bumpers. Leveda Anna, M.D.  Electronically Signed    TT/MEDQ  D:  11/21/2007  T:  11/22/2007  Job:  [id number]

## 2010-08-26 NOTE — Discharge Summary (Signed)
NAME:  YOULANDA, TOMASSETTI                 ACCOUNT NO.:  [id number]   MEDICAL RECORD NO.:  [id number]         PATIENT TYPE:  CINP   LOCATION:                               FACILITY:  [facility]   PHYSICIAN:  Santiago Bumpers. Hensel, M.D.DATE OF BIRTH:  07/02/1950   DATE OF ADMISSION:  11/20/2007  DATE OF DISCHARGE:                               DISCHARGE SUMMARY   ADDENDUM.   DISCHARGE LABS:  On discharge, the patient's CBC was white blood cell  count of 6, hemoglobin 11.4, hematocrit of 35.0, and platelets of 225.  INR of 1.8 and PT of 21.8.  Sodium 139, potassium 3.8, chloride 104,  bicarb 28, BUN 10, creatinine 0.8, and glucose 110.  Total protein 6.9,  albumin 3.1, alkaline phosphatase 79, AST 24, ALT 37, and total  bilirubin 0.8.      Rodney Langton, MD  Electronically Signed      Santiago Bumpers. Leveda Anna, M.D.  Electronically Signed    TT/MEDQ  D:  11/21/2007  T:  11/22/2007  Job:  [id number]

## 2010-08-26 NOTE — Assessment & Plan Note (Signed)
[facility]                             PULMONARY OFFICE NOTE   SABRIA, FLORIDO                        MRN:          [id number]  DATE:09/17/2006                            DOB:          March 02, 1951    Ms. Hajduk returns today in followup. A 60 year old African-American  female with a history of vocal cord dysfunction syndrome, asthmatic  bronchitis and sleep apnea. She is stable at this time with no active  complaints. Medication profile is unchanged and listed in the chart.   PHYSICAL EXAMINATION:  VITAL SIGNS:  Temperature 98, blood pressure  108/70, pulse 72, saturation 97% on room air.  CHEST:  Showed to be clear without evidence of wheeze, rale or rhonchi.  The remaining portion of the physical examination is totally  unremarkable.   IMPRESSION:  1. Stable reflux disease.  2. Asthmatic bronchitis.   PLAN:  Maintain inhaled medicines as dosed. Will see the patient back in  return followup.     Charlcie Cradle Delford Field, MD, [facility]  Electronically Signed    PEW/MedQ  DD: 09/17/2006  DT: 09/17/2006  Job #: [id number]

## 2010-08-26 NOTE — Assessment & Plan Note (Signed)
[facility]                             PULMONARY OFFICE NOTE   ESTEPHANIE, HUBBS                        MRN:          [id number]  DATE:01/03/2007                            DOB:          1950-08-29    Ms. Troupe is a 60 year old African-American female with a history of  moderate persistent asthma, reflux disease, vocal cord dysfunction, and  severe sleep apnea. She is doing reasonably well with no active  complaints.   She maintains:  1. Advair 250/50 1 spray b.i.d.  2. Singulair 10 mg daily.  3. Nexium 40 mg b.i.d.  4. CPAP 11 centimeters nightly.  5. Brovana by nebulization b.i.d.   PHYSICAL EXAMINATION:  VITAL SIGNS:  Temperature 97, blood pressure  112/68, pulse 61, saturation 98% on room air.  CHEST:  Clear without evidence of wheeze or rhonchi.  CARDIAC:  Regular rate and rhythm without S3, normal S1, S2.  ABDOMEN:  Soft and nontender.  EXTREMITIES:  No edema or clubbing.  SKIN:  Clear.   IMPRESSION:  Moderate persistent asthma, reflux disease, moderate  obstructive sleep apnea.   PLAN:  Maintain inhaled medicines as currently dosed and we will see the  patient back in return follow up.     Charlcie Cradle Delford Field, MD, [facility]  Electronically Signed    PEW/MedQ  DD: 01/03/2007  DT: 01/03/2007  Job #: [id number]

## 2010-08-28 ENCOUNTER — Telehealth: Payer: Self-pay | Admitting: *Deleted

## 2010-08-28 NOTE — Telephone Encounter (Signed)
Pt has defib and has a "knee system" she uses for pain/muscle.  One hour treatment on each knee.  Will this affect her device.

## 2010-08-28 NOTE — Telephone Encounter (Signed)
Spoke with patient.  She has a "Knee system" and wants to know if it will interfere with her ICD.  Since she is due for an office visit this month with Dr. Johney Frame I suggested she make an appointment and bring the system with her.  She will call and schedule.

## 2010-08-29 NOTE — Assessment & Plan Note (Signed)
[facility]                               PULMONARY OFFICE NOTE   [name], [name]                        MRN:          [id number]  DATE:12/24/2005                            DOB:          03-18-1951    [name] is a 60 year old African-American female with a history of  moderate persistent asthma, severe obstructive sleep apnea, chronic atrial  fibrillation, coronary artery disease with moderate left ventricular  dysfunction, type 2 diabetes, vocal cord dysfunction syndrome, reflux  disease.  She has been doing well with no active respiratory complaints.  She has been maintained on Advair 250/50 1 spray b.i.d., Singulair 10 mg  daily, Nexium 40 mg b.i.d.  She is on CPAP 11 cm by nasal mask nightly.  She  wears a CPAP at least six hours nightly.   PHYSICAL EXAMINATION:  VITAL SIGNS:  Temp 98, blood pressure 130/76, pulse  63, saturation 98% on room air.  GENERAL:  This is an obese African-American female in no distress.  CHEST:  Diminished breath sounds with no evidence of wheeze or rhonchi.  CARDIAC:  Regular rate and rhythm without S3.  Normal S1 and S2.  ABDOMEN:  Soft and nontender.  EXTREMITIES:  No edema or clubbing.  SKIN:  Clear.  NEUROLOGIC:  Intact.  HEENT:  No jugular venous distention.  No lymphadenopathy.  Oropharynx  clear.  Neck supple.   IMPRESSION:  Stable vocal cord dysfunction syndrome, stable asthma, reflux  disease.   RECOMMENDATIONS:  Maintained inhaled medicines as currently dosed.  Will see  this patient back in return followup in three months.                                   Charlcie Cradle Delford Field, MD, FCCP   PEW/MedQ  DD:  12/24/2005  DT:  12/25/2005  Job #:  [id number]

## 2010-08-29 NOTE — Discharge Summary (Signed)
NAME:  [name], [name]                           ACCOUNT NO.:  [id number]   MEDICAL RECORD NO.:  [id number]                   PATIENT TYPE:  INP   LOCATION:  2006                                 FACILITY:  [facility]   PHYSICIAN:  Thereasa Solo. Little, M.D.              DATE OF BIRTH:  [birth year]   DATE OF ADMISSION:  05/18/2002  DATE OF DISCHARGE:  05/26/2002                                 DISCHARGE SUMMARY   ADMISSION DIAGNOSES:  1. Paroxysmal atrial fibrillation with rapid ventricular response.  2. Chest pain.  3. Hypertension.  4. Asthma.   DISCHARGE DIAGNOSES:  1. Paroxysmal atrial fibrillation.  2. Asthma.  3. Minimal coronary artery disease with coronary spasm and wall motion     abnormalities by cardiac catheterization, May 19, 2002.  4. Hypertension.  5. Non-insulin-dependent diabetes mellitus.  6. Chest pain, resolved.  Negative for myocardial infarction.   PROCEDURES:  Cardiac catheterization.   COMPLICATIONS:  None.   DISCHARGE STATUS:  Stable.  Improved.   ADMISSION HISTORY:  This is a 60 year old female with history of PAF and  asthma.  She had routine followup for asthma with her primary care on this  day of admission.  While being evaluated, she noted her heart rate was fast  and irregular.  EKG was performed and the patient was found to be in rapid  atrial fibrillation.  She was sent to [facility] ER for further treatment and  evaluation.   The patient states that over the last six months, she has had episodes at  least two to three times per week of a rapid and irregular heart beat.  She  states it always occurs when she is doing any strenuous physical activity,  i.e., she quotes examples of mopping the floor or walking from her car into  a store.  She denies any associated chest pain, shortness of breath or near-  syncope, but does experience generalize weakness, tiredness and fatigue as  well as some mild lightheadedness with each episode.  She states that the  tachyarrhythmia will resolve on its own eventually.   She has been intolerant to beta blockers in the past secondary to  exacerbation of her asthma.  Last echocardiogram in 2001 showed normal LV  function with an EF of 59%.  There was no noted valvular disease.   PHYSICAL EXAMINATION ON ADMISSION:  VITAL SIGNS:  Blood pressure 111/85,  heart rate 97, respirations 20, temperature 97.9, O2 saturations 96% on room  air.  GENERAL:  Overall conscious and alert, oriented x 3.  SKIN:  Skin warm and dry.  NECK:  There was no JVD or thyromegaly.  No carotid bruit.  LUNGS:  Lungs were clear to auscultation bilaterally.  HEART:  Irregular rhythm and tachycardic.  Soft 1/6 systolic murmur at the  left sternal border.  ABDOMEN:  Abdomen was benign with normal bowel sounds and no bruit.  EXTREMITIES:  Extremities were non-edematous.  Pulses were +2 bilaterally.  No femoral bruit.  NEUROLOGIC:  There were no neurological deficits.   LABORATORY AND ACCESSORY CLINICAL DATA:  EKG showed atrial fibrillation,  initially heart rate of 159 and slowed to 112.   Chest x-ray showed no acute cardiopulmonary findings.   Admission labs showed a normal CBC, PT and PTT.  CMP was normal with the  exception of mild hypokalemia at 3.4 and elevated glucose at 139.  LFTs were  normal.  Cardiac enzymes, TSH and digoxin level were also normal.   HOSPITAL COURSE:  The patient was admitted for rate control.  Two-  dimensional echo will be rechecked.  She will be started on IV heparin and  IV Cardizem.  Cardiac enzymes will be cycled to rule out MI.  Cardiac  catheterization was also planned for the next a.m. to evaluate coronary  anatomy.   On May 19, 2002, she had no further chest pain.  She was feeling much  better.  She had converted to a sinus rhythm with an occasional PVC.  Vital  signs were stable.  Repeat cardiac enzymes were negative.   She was taken to the cardiac cath lab on May 19, 2002.  This  showed no  significant CAD.  She had a minimal ostial left main lesion, RCA lesion and  LAD lesion.  There was some inferolateral hypokinesis noted which brings up  the question of transient vasospasm as the source of her chest pain.  EF was  calculated at 60%.   Rest of her hospitalization, she continued to have episodes of PAF and then  spontaneously converting to NSR.  IV Cardizem was changed to p.o. with  controlled ventricular response.  Coumadin load was begun with Lovenox as a  bridge until her INR was therapeutic.  She was switched from Cardizem to  flecainide to see if further recurrence of atrial fibrillation could be  aborted.  This apparently was also unsuccessful, with her continuing to have  episodes of atrial fibrillation and then back to sinus rhythm or in sinus  bradycardia.  Flecainide was discontinued and she was started on Tikosyn.  She continued to have episodes of PAF, however, after starting on Tikosyn,  developed more bradycardia and essentially a 2:1 A-V block.  At this point,  EP consult was requested.  On May 23, 2002, Dr. Doylene Canning. Ladona Ridgel saw the  patient.  He agreed with anticoagulation with Coumadin.  He did note that  she had fairly significant bradycardia on digoxin, Cardizem and flecainide.  At this point, he agreed with continuing with Tikosyn.  QTc will need to be  monitored.  Hydrochlorothiazide was discontinued.  Vital signs remained  stable.   Episodes of PAF became less frequent, however, apparently were noticeable,  especially after albuterol nebulizers.  She would spontaneously, however,  convert back to a sinus rhythm.  Vital signs remained stable.  She had no  further bradycardia.  QTc was within normal limits.  Coumadin load  continued.  On May 30, 2002, she was finally ready for discharge.  Vital signs remained stable.  Heart rate remained controlled in the 60s and 70s.  She was tolerating the Tikosyn well.  QTc at discharge was  0.41.  Potassium was 3.9.  INR was 1.6.   DISCHARGE MEDICATIONS:  1. Protonix 40 mg daily.  2. Celexa 40 mg daily.  3. Singulair 10 mg daily.  4. Glucophage 500 mg b.i.d.  5. Advair inhaler 250/50 one puff  b.i.d.  6. Atacand 16 mg daily.  7. Xopenex nebulizers q.6h. p.r.n.  8. Coumadin 5 mg daily at suppertime.  She is to take 10 mg only on     Saturday, May 27, 2002, and then back to 5 mg daily.  9. Tikosyn 500 mcg b.i.d.  10.      Lovenox injections b.i.d.   SPECIAL DISCHARGE INSTRUCTIONS:  She is not to take aspirin,  hydrochlorothiazide or Claritin.  She is not to use her albuterol in her  nebulizer.   ACTIVITY:  No strenuous activity until she sees Dr. Gaspar Garbe B. Little for  followup.   DIET:  She is to maintain a low-salt, low-fat and low-cholesterol diet, as  well as her diabetic diet restrictions.  She has been instructed on  discontinuing caffeine intake or any OTC medications with ephedrine.   FOLLOWUP:  She will need blood work on May 29, 2002 to recheck INR.   She is to see Dr. Clarene Duke in one week; she is to call and set up an  appointment.  She is to call Dr. Lubertha Basque office in six weeks for followup  appointment.      Adrian Saran, N.P.                        Thereasa Solo. Little, M.D.    HB/MEDQ  D:  07/04/2002  T:  07/05/2002  Job:  [id number]   cc:   Redge G[facility]   Shan Levans, M.D. [facility]

## 2010-08-29 NOTE — Cardiovascular Report (Signed)
NAME:  [name], [name]                 ACCOUNT NO.:  [id number]   MEDICAL RECORD NO.:  [id number]          PATIENT TYPE:  INP   LOCATION:  2912                         FACILITY:  [facility]   PHYSICIAN:  Darlin Priestly, MD  DATE OF BIRTH:  1950/09/17   DATE OF PROCEDURE:  08/20/2005  DATE OF DISCHARGE:                              CARDIAC CATHETERIZATION   PROCEDURES:  1.  Left heart catheterization.  2.  Coronary angiography.  3.  Left ventriculogram.   ATTENDING:  Dr. Lenise Herald.   COMPLICATIONS:  None.   INDICATIONS:  [name] is a 60 year old female, patient of Dr. Caprice Kluver,  with a history of diabetes, obstructive sleep apnea, asthma, history of  paroxysmal atrial fibrillation on Tikosyn, who was admitted on Aug 19, 2005,  for chest pain.  She did subsequently rule out for MI.  She does have a  history of abnormal Cardiolite with suggestive of anteroseptal ischemia.  She is now referred for cardiac catheterization to rule out significant CAD.   DESCRIPTION OF PROCEDURE:  After informed consent, patient was brought to  the cardiac cath lab.  The right and left groin were shaved, prepped and  draped in the usual sterile fashion.  ECG monitoring established.  Using  modified Seldinger technique, a #6-French arterial sheath was inserted in  the right femoral artery.  A 6-French diagnostic catheter was needed to  perform diagnostic angiography.   The left main is a large vessel with no significant disease.   The LAD is a large vessel which courses to the apex with 2 diagonal  branches.  The LAD has mild 30% narrowing after the takeoff of the second  diagonal.   The first and second diagonals are small-to-medium-sized vessels with no  significant disease.   The left circumflex is a medium-sized vessel which courses in the AV groove  and gives rise to 4 obtuse marginal branches.  The AV groove circumflex has  no significant disease.   The first OM is a small vessel with  a 60% ostial/proximal lesion.   The second and fourth OM are medium-sized vessels which bifurcate distally  with no significant disease.   The third OM is a small vessel with no significant disease.   The right coronary artery is medium-sized vessel which is dominant and gives  rise to both PDA and posterolateral branch.  There is no significant disease  in the RCA, PDA or posterolateral branch.   Left ventriculogram reveals a mildly depressed EF of 45-50%, though this is  difficult to quantitate as the patient does have atrial fibrillation with  rapid ventricular response.   HEMODYNAMICS:  Systemic arterial pressure 84/52,  LV systolic pressure 86/0,  LVDP of 4.   CONCLUSION:  1.  No significant CAD.  2.  Mildly depressed LV systolic function, though this is difficult to      quantitate secondary to AFib with rapid ventricular response.      Darlin Priestly, MD  Electronically Signed     RHM/MEDQ  D:  08/20/2005  T:  08/21/2005  Job:  [id number]  cc:   Thereasa Solo. Little, M.D.  Fax: [phone]

## 2010-08-29 NOTE — Discharge Summary (Signed)
[facility]  Patient:    [name], [name] Visit Number: [id number] MRN: [id number]          Service Type: MED Location: 2S 6213 01 Attending Physician:  Caleb Popp Dictated by:   Oley Balm Sung Amabile, M.D. [facility] Admit Date:  09/27/2001 Discharge Date: 09/29/2001                             Discharge Summary  DATE OF BIRTH:  04/09/1951  ADMISSION DIAGNOSES: 1. Asthma exacerbation. 2. Vocal cord dysfunction syndrome. 3. Gastroesophageal reflux disease. 4. Rule out sinusitis. 5. History of hypertension. 6. Obesity. 7. Type 2 diabetes.  DISCHARGE DIAGNOSES: 1. Asthma exacerbation. 2. Vocal cord dysfunction syndrome. 3. Gastroesophageal reflux disease. 4. Rule out sinusitis. 5. History of hypertension. 6. Obesity. 7. Type 2 diabetes.  HISTORY OF PRESENT ILLNESS:  Please refer to the admission history and physical for this patients initial presentation.  Briefly, she was admitted on 09/27/01, by Dr. Delford Field for refractory asthma exacerbation.  She had been seen twice in the office and once in the emergency department during the week prior to admission.  On admission, she was noted to have diffuse inspiratory and expiratory wheezes, and was noted to be in moderate respiratory distress.  HOSPITAL COURSE:  She was treated in the usual fashion for asthma exacerbation with systemic steroids and bronchodilators.  She was also treated with ceftriaxone.  Her diabetes was covered with sliding scale insulin.  She was treated with aggressive cough suppression, using both Tussionex and Tessalon pearls.  With these interventions, she was markedly improved on the day following admission and was almost back to baseline on the day of discharge. She is discharged now in much improved condition.  DISCHARGE MEDICATIONS:  1. Advair 250/50 one inhalation q.12h.  2. Albuterol inhaler 2 puffs q.4h. p.r.n.  3. Albuterol nebulizer p.r.n.  4. Singular 10 mg p.o.  q.d.  5. Zyrtec 10 mg p.o. q.d.  6. Prednisone taper 40 mg x2 days, 30 mg x2 days, 20 mg x2 days, 10 mg x2     days, stop.  7. Hydrochlorothiazide 25 mg p.o. q.a.m. (note reduction in dose from 50 mg     q.d.).  8. Atacand 16 mg p.o. q.d.  9. Baby aspirin one p.o. q.d. 10. Protonix 40 mg p.o. q.d. 11. Celexa 40 mg p.o. q.d. 12. Note that digoxin (which she was taking for palpitations) was stopped upon     discharge. 13. Metformin 500 mg p.o. b.i.d.  DISCHARGE INSTRUCTIONS: 1. She was instructed to continue to work on weight loss. 2. Diabetic diet. 3. She is to call Dr. Florene Route office after discharge to arrange for followup    in two to three weeks.  Please note that on the day of discharge that I had an extended discussion with this patient.  I am concerned that she is on so many medications at a relatively young age.  I expressed this concern to her, and she is likewise concerned about the number of medications.  I have started the process of tapering or discontinuing medications as tolerated.  Further consideration would be reducing her antihypertensive regimen to a single agent.  Also, we could consider discontinuation of the Celexa in the future if deemed appropriate. Dictated by:   Oley Balm Sung Amabile, M.D. [facility] Attending Physician:  Caleb Popp DD:  09/29/01 TD:  10/01/01 Job: [id number]

## 2010-08-29 NOTE — Assessment & Plan Note (Signed)
[facility]                             PULMONARY OFFICE NOTE   [name], [name]                        MRN:          [id number]  DATE:07/06/2006                            DOB:          [birth year]    [name] is seen today as a work-in, complains of increased wheezing,  cough, productive dark mucus, shortness of breath.  She was in the  emergency room recently.  They gave her a Z-Pak and prednisone for 4  days which she is completing.  She is on Advair 250/50, 1 spray b.i.d.;  Singulair 10 mg daily; Zyrtec 10 mg daily; Nexium 40 mg b.i.d.; CPAP 11  cm h.s.  Other maintenance medicines listed in the chart are correct as  reviewed.   ON EXAM:  VITAL SIGNS: Temperature 98.  Blood pressure 128/78.  Pulse  116.  Saturation 99% on room air.  CHEST:  Showed inspiratory and expiratory squeak.  Poor air flow.  CARDIAC EXAM:  Showed regular rate and rhythm without S3.  Normal S1,  S2.  ABDOMEN:  Soft and nontender.  EXTREMITIES:  Showed no edema or clubbing.  SKIN:  Clear.  NEUROLOGIC EXAM:  Intact.  HEENT EXAM:  Showed no jugular venous distention.   IMPRESSION:  Vocal cord dysfunction syndrome, moderate persistent  asthma, reflux disease, acute bronchitis, and sleep apnea.   PLAN:  The patient is to maintain Zyrtec and Nasonex as prescribed.  Increase Mucinex to 2 b.i.d.  Pulse prednisone at 40 mg a day with a  rapid taper.  Begin Avelox 400 mg a day for 5 days.  Discontinue Advair  for 1 week and resume.  Administer Tussionex p.r.n. cough.  We will see  the patient back in followup in three weeks.     Charlcie Cradle Delford Field, MD, [facility]  Electronically Signed    PEW/MedQ  DD: 07/06/2006  DT: 07/06/2006  Job #: [id number]

## 2010-08-29 NOTE — Discharge Summary (Signed)
NAME:  [name], [name]                 ACCOUNT NO.:  [id number]   MEDICAL RECORD NO.:  [id number]          PATIENT TYPE:  INP   LOCATION:  3734                         FACILITY:  [facility]   PHYSICIAN:  Thereasa Solo. Little, M.D. DATE OF BIRTH:  Mar 29, 1951   DATE OF ADMISSION:  08/19/2005  DATE OF DISCHARGE:  08/25/2005                                 DISCHARGE SUMMARY   DISCHARGE DIAGNOSIS:  1.  Paroxysmal atrial fibrillation.  2.  Moderate coronary disease at catheterization.  3.  Mild left ventricular dysfunction with an EF of 45 to 50%.  4.  Noninsulin-dependent diabetes.  5.  History of asthma, beta-blocker intolerant.  6.  History of sleep apnea.  7.  Dyslipidemia.   HOSPITAL COURSE:  The patient is a 60 year old female followed by Dr. Clarene Duke  who was admitted Aug 19, 2005 with history of chest pain.  She has a history  of AF and has been intolerant of other antiarrhythmics.  She has had a  Cardiolite that has been abnormal with anterior septal ischemia.  She has a  history of prior mild coronary disease.  She was admitted to telemetry.  Coumadin was held.  She was put on IV heparin and nitrates.  She was set up  for diagnostic catheterization Aug 20, 2005.  This revealed a 60% small OM1,  30% mid distal LAD, normal circumflex, normal left main, normal RCA with an  EF of 45 to 50%.  She was in atrial fibrillation with rapid ventricular  response at the time of her catheterization.  She was seen in consult by Dr.  Ladona Ridgel.  She had been off Tikosyn, and this was resumed.  We also increased  her calcium blocker.  She has been in and out of atrial fibrillation this  admission.  Dr. Ladona Ridgel has recommended outpatient evaluation at [facility] for  possible atrial fibrillation.  The remainder of her hospital course was  adjusting her Coumadin.  The patient refused to do home Lovenox.  We feel  she can be discharged on Aug 25, 2005.  Her INR is 2.1.  She will contact  [facility] for further EP  evaluation.  This has been arranged by Dr.  Ladona Ridgel.   DISCHARGE MEDICATIONS:  Her discharge medications are:  1.  Coated aspirin 81 mg a day.  2.  Tikosyn 500 mcg twice a day.  3.  Coumadin 7.5 mg a day or as directed.  4.  Cardizem CD 300 mg a day.  5.  Zocor 20 mg a day.  6.  Atacand 16 mg a day.  7.  Singulair 10 mg a day.  8.  Wellbutrin XL 5 mg a day.  9.  Nexium 40 mg a day.  10. Glipizide 5 mg a day.   LABORATORY DATA:  INR at discharge is 2.1.  Sodium 139, potassium 4.5, BUN  11, creatinine 1.20. White count 7.8, hemoglobin 11.9, hematocrit 35.6,  platelets 245. Liver functions were normal. CKMBs were negative, or CKs were  elevated, but troponins and MBs were negative. LDL was 108.  TSH 1.21.  Urinalysis unremarkable.  EKG shows atrial fibrillation. Telemetry strips did  show sinus rhythm periodically. Chest x-ray shows cardiomegaly and vascular  congestion on May 9.   DISPOSITION:  The patient is discharged in stable condition and will follow-  up with Dr. Clarene Duke in a few weeks.  She knows to contact [facility]  for further EP evaluation.  Dr. Ladona Ridgel was also going to contact [facility].      Abelino Derrick, P.A.    ______________________________  Thereasa Solo. Little, M.D.    Lenard Lance  D:  08/25/2005  T:  08/25/2005  Job:  [id number]   cc:   Doylene Canning. Ladona Ridgel, M.D.  [address]  [address]  [place]  [place] [address]

## 2010-08-29 NOTE — H&P (Signed)
NAME:  [name], [name]                           ACCOUNT NO.:  [id number]   MEDICAL RECORD NO.:  [id number]                   PATIENT TYPE:  INP   LOCATION:  5015                                 FACILITY:  [facility]   PHYSICIAN:  Santiago Bumpers. Hensel, M.D.             DATE OF BIRTH:  10-Apr-1951   DATE OF ADMISSION:  01/14/2002  DATE OF DISCHARGE:                                HISTORY & PHYSICAL   PRIMARY PHYSICIAN:  [name], M.D.   CHIEF COMPLAINT:  Shortness of breath and cough.   HISTORY OF PRESENT ILLNESS:  This is a 60 year old African American female  who has a history of asthma and presents with a one-week history of  shortness of breath and cough.  Dr. Shan Levans is her pulmonologist and  she saw him one week ago for these symptoms, at which time he gave the  patient cough medicines and antibiotics of unknown type.  Dr. Delford Field told  the patient that the chest x-ray he obtained was negative for pneumonia.  The patient's shortness of breath and dyspnea worsened today at 10 a.m.  She  also complains of a dry cough with occasional yellow sputum.  No fevers, no  sore throat, no rhinorrhea.  She took albuterol nebulizers every three hours  at home without improvement and called Dr. Lynelle Doctor office and they told the  patient to come directly to the emergency department.  The patient's last  hospitalization was in June of 2003 for an asthma exacerbation.  She denies  any ICU admissions or intubations.   REVIEW OF SYSTEMS:  No fevers.  Positive for night sweats x1 week.  No  weight loss.  No chest pain.  Positive lower extremity edema bilaterally x1  week.  Positive cough.  Positive shortness of breath.  Positive wheeze x1  week.  No abdominal pain.  Positive heartburn.  No constipation.  No rashes.  Positive dizziness.  Positive glasses.  No dysuria.  No easy bruising.  No  cold intolerance.   PROBLEM LIST:  1. Asthma.  2. Diabetes mellitus, type 2.  3. Obesity.  4.  Depression.  5. Hypertension.  6. GERD.   ALLERGIES:  No known drug allergies.   MEDICATIONS:  1. Advair Diskus 250/50 mcg one puff b.i.d.  2. Aspirin 81 mg every day.  3. Atacand 16 mg every day.  4. Celexa 40 mg every day.  5. Glucophage 1000 mg q.h.s. and 500 mg q.a.m.  6. Flonase p.r.n.  7. Hydrochlorothiazide 50 mg every day.  8. Lanoxin 0.25 mg every day.  9. Multivitamin.  10.      Nexium 40 mg every day.  11.      Singulair 10 mg every day.  12.      Avalox 400 mg every day -- patient started five days ago.   SOCIAL HISTORY:  The patient lives with husband and daughter.  She is  a  nonsmoker and denies alcohol.  Her triggers for asthma include many things  including odors, pets, smoke and an increased pollen count.   SURGICAL HISTORY:  1. Nissen fundoplication, 1998.  2. Right breast biopsy with sclerosing adenosis in 1993.  3. Cholecystectomy.  4. Right knee arthroscopy in 2000.   FAMILY HISTORY:  Family history positive for asthma in a sister, positive  for diabetes in her father.  No coronary artery disease or cancer.   PHYSICAL EXAMINATION:  VITAL SIGNS:  Temperature 98.1, pulse 74,  respirations 35, blood pressure 113/57.  Oxygen saturation 98% on room air  and that was after two nebulizers plus 60 mg of prednisone in the emergency  department.  GENERAL:  This is an obese African American in moderate respiratory distress  who cannot complete sentences without taking breaths and coughing.  She is  alert and oriented x3.  HEENT:  PERRLA.  EOMI.  Sclerae nonicteric.  Moist mucous membranes.  Left  eye strabismus.  TMs intact with good light reflex bilaterally.  Nares  clear.  Oropharynx without erythema or exudate.  NECK:  Neck supple.  No thyromegaly.  No lymphadenopathy.  RESPIRATORY:  Increased work of breathing, increased expiratory-to-  inspiratory ratio.  Decreased air exchange.  Positive expiratory wheezes  bilaterally.  CARDIOVASCULAR:  Tachycardia.   Regular rhythm.  No murmurs.  S1 and S2  heard.  Trace edema present in bilateral lower extremities.  Nail beds  without cyanosis or clubbing.  ABDOMEN:  Obese, soft, nontender, nondistended.  No hepatosplenomegaly.  SKIN:  No rashes or lesions.  LYMPHATIC:  No cervical or supraclavicular adenopathy.  NEUROLOGIC:  Cranial nerves II-XII grossly intact.  Deep tendon reflexes 2+  bilaterally.  Strength 5/5, bilaterally.  EXTREMITIES:  Pulses 2+ bilaterally.  RECTAL:  Deferred.   LABORATORY AND ACCESSORY CLINICAL DATA:  Chest x-ray showed no acute  disease.   ASSESSMENT AND PLAN:  This is a 60 year old African American female with  asthma exacerbation.   1. Pulmonary:  The patient received 60 mg of prednisone and albuterol plus     Atrovent nebulizers x2 in the emergency department.  On exam, the patient     was in moderate respiratory distress, therefore, we will continue the     albuterol nebulizers every two hours and one hour p.r.n. with Atrovent     nebulizers every six hours x2.  We will start prednisone 80 mg every day     and check an arterial blood gas to assess acid base status.  Continue the     pulse oximetry and give oxygen by nasal cannula to keep oxygen saturation     above 95%.  2. Infectious disease:  The chest x-ray is negative for acute infection and     the patient is afebrile.  She has been on five days of antibiotics for     presumed infection, so we will start Tequin to finish her seven-day     course.  3. Diabetes mellitus, type 2:  We will monitor capillary blood glucoses,     since patient on high-dose steroids.  We will continue the Glucophage and     may need sliding-scale insulin while on steroids; we will start that at     this     time.  4. Gastroesophageal reflux disease:  Continue Protonix as taking at home.  5. Depression:  Stable on current medications.  We will continue the Celexa.    Billey Gosling, M.D.  William A. Leveda Anna,  M.D.    AS/MEDQ  D:  01/15/2002  T:  01/18/2002  Job:  [id number]   cc:   [name], M.D.  [facility]  [place], [place] [address]  Fax: [phone]

## 2010-08-29 NOTE — Consult Note (Signed)
NAME:  [name], [name]                           ACCOUNT NO.:  [id number]   MEDICAL RECORD NO.:  [id number]                   PATIENT TYPE:  INP   LOCATION:  5015                                 FACILITY:  [facility]   PHYSICIAN:  Shan Levans, M.D. [facility]            DATE OF BIRTH:  [birth year]   DATE OF CONSULTATION:  01/16/2002  DATE OF DISCHARGE:  01/18/2002                                   CONSULTATION   CHIEF COMPLAINT:  Respiratory distress.   HISTORY OF PRESENT ILLNESS:  This is a 60 year old African-American female  well known to me.  He has very severe reflux disease, extrinsic asthma,  vocal cord dysfunction syndrome, nonsmoker.  The patient was admitted on  01/14/2002 to the family practice service with status asthmaticus type  symptoms. Despite aggressive asthma care, the patient's symptoms are not  improved.  I had previously seen the patient in the office for bronchitis,  and he was given Avelox 400 mg a day.  She did no respond well to this.  She  notes increased hoarseness, increased reflux symptomatology, increased upper  airway irritability, increased pseudo wheezing.  The patient is now admitted  to inpatient service.   PAST MEDICAL HISTORY:  1. Type 2 diabetes.  2. History of obesity.  3. Gastroesophageal reflux disease. She had a  Nissen fundoplication in 1998     but has apparently failed this with recurrent reflux.  4. History of hypertension.  5. Chronic arthritis.  6. Depression.  7. History of sinus bradycardia.  8. History of nonsmoking.   SOCIAL HISTORY:  The patient lives with husband, does not smoke or drink.   PAST SURGICAL HISTORY:  1. Nissen fundoplication in 1998.  2. Right breast biopsy in 1993 which was benign.  3. Cholecystectomy in 2000.  4. Right knee arthroscopy in 2002.   MEDICATIONS PRIOR TO ADMISSION:  1. Advair 250/50 one spray b.i.d.  2. Aspirin 81 mg daily.  3. Atacand 15 mg daily.  4. Celexa 40 mg daily.  5. Flonase 2 sprays each  nostril daily p.r.n.  6. Glucophage 1000 mg h.s., 500 mg a.m.  7. HCTZ 50 mg daily.  8. Lanoxin 0.25 mg daily.  9. Nexium 40 mg daily.  10.      Singulair 10 mg daily.   ALLERGIES:  None.   PHYSICAL EXAMINATION:  GENERAL:  This is an ill-appearing African-American  female in no acute distress.  VITAL SIGNS:  Temperature 98, blood pressure 116/60, pulse 85, respirations  20, O2 saturation 95% on 1 liter.  CHEST:  Inspiratory and expiratory wheeze which was primarily pseudo  wheezing which seems to go away with pursed lip breathing.  No rhonchi, no  rales.  CARDIAC:  Resting tachycardia without S3.  Normal S1 and S2.  ABDOMEN:  Soft,nontender.  Bowel sounds active.  EXTREMITIES:  No edema or clubbing.  NEUROLOGIC:  Intact.  NECK:  No  jugular venous distention or lymphadenopathy.  HEENT:  Increased nasal inflammation.   LABORATORY DATA:  Sodium133, potassium3.9, chloride 97, CO2 24, BUN 10,  creatinine 0.9, blood sugar 179.  White count 7.7, hemoglobin 12.6.  On room  air, pH 7.46, PCO2 34, PO2 41.   Chest x-ray showed no active disease.   IMPRESSION:  1. The patient appears to have primarily vocal cord dysfunction syndrome,     postnasal drip syndrome, pseudo wheezing, and cyclic coughing aggravated     likely by gastroesophageal reflux disease flare; however, need to rule     out sinusitis.  the patient's asthmatic component does not appear to be a     major role here.  Most of the wheezing is upper airway in nature.   RECOMMENDATIONS:  The patient begin Tussionex 5 cc q.8h on schedule along  with Tessalon Perles 100 mg q.6h, also to switch to Solu-Medrol 80 mg q.8h.  Discontinue prednisone.  Begin Klonopin 1 mg q.8h. on schedule along with  Seroquel 50 mg b.i.d.  Increase Protonix to 40 mg b.i.d.  Obtain a sinus CT  scan and will be happy to take the patient under my primary service if  family practice so desires.                                               Shan Levans, M.D. [facility]    PW/MEDQ  D:  01/16/2002  T:  01/18/2002  Job:  [id number]   cc:   William A. Hensel, M.D.  [address] [place]  [place] [address]  Fax: [phone]

## 2010-08-29 NOTE — Consult Note (Signed)
[facility]  Patient:    [name], [name] Visit Number: [id number] MRN: [id number]          Service Type: FTC Location: FOOT Attending Physician:  Sharren Bridge Dictated by:   Jonelle Sports Cheryll Cockayne, M.D. Proc. Date: 03/31/01 Admit Date:  03/31/2001   CC:         [name], M.D., [facility]   Consultation Report  HISTORY:  This pleasant 60 year old black female has recently been diagnosed with type 2 diabetes and begun on therapy.  Her hemoglobin A1c was 8.7%.  She has had bilateral painful calluses on her feet and is referred here for our evaluation and treatment of those.  She has never had foot ulceration, cellulitis or abscess.  She does lack feeling in her feet and is very much aware of this.  PAST MEDICAL HISTORY:  Past medical history characterized also by depression, hypertension, asthma and reflux disease.  PAST SURGICAL HISTORY:  Surgical procedures have included cholecystectomy, [name] Nissen fundoplication, right breast biopsy and knee arthroscopy.  ALLERGIES:  She has no known allergies.  MEDICATIONS:  Her regular medications include Glucophage twice daily, Advair, aspirin, calcium acetate, Celexa, Elavil, hydrochlorothiazide, Lanoxin, Norvasc, Protonix, Singulair and multiple vitamin.  EXAMINATION:  EXTREMITIES:  Examination today is limited to the distal lower extremities. The feet are without gross deformity with the exception of [name] mild hallux valgus configuration of the right foot.  There is slight clawing of the toes bilaterally.  The skin of the heels is quite dry.  The skin temperatures of both feet are normal and symmetrical.  Pulses are everywhere palpable and adequate.  Monofilament testing shows an absence of protective sensation throughout.  There are callused areas dorsolaterally on the fifth toes bilaterally at the area of the first MP joints, on the tips of multiple toes and underlying several of  the metatarsal heads.  There is evidence that the patient has treated some of these herself with razor blade paring.  There is no sign of cellulitis, broken skin or other problems.  DISPOSITION: 1. The patients footwear are evaluated and found to be inadequate in width    and depth and she is accordingly referred to Mr. Sindy Messing at    [facility] to obtain shoes of adequate width as    well as custom-molded inserts. 2. She is given general instruction regarding foot care and diabetes by video    with nurse and physician reinforcement. 3. The generalized callused areas of her heels are gently reduced by [name] Dremel    without complication. 4. Calluses on the tips of the left third, fourth and fifth toes as well as    the right third toe are sharply pared without incident. 5. The patient is given [name] return appointment here for one month, in the    interim having obtained her shoes as prescribed today. Dictated by:   Jonelle Sports Cheryll Cockayne, M.D. Attending Physician:  Sharren Bridge DD:  03/31/01 TD:  04/01/01 Job: [id number]

## 2010-08-29 NOTE — Assessment & Plan Note (Signed)
[name]                             PULMONARY OFFICE NOTE   [name], [name]                        MRN:          [id number]  DATE:06/28/2006                            DOB:          1950/12/10    HISTORY OF PRESENT ILLNESS:  The patient is a 60 year old African  American  female, patient of Dr. Lynelle Doctor who has a known history of  asthma, severe obstructive sleep apnea, a vocal cord dysfunction  syndrome and reflux disease, presents for an acute office visit.  The  patient complains of a 3 day history of sore throat, post nasal drip,  nasal congestion, dry cough, wheezing.  The patient denies any fever,  purulent sputum, chest pain, orthopnea, PND or leg swelling.   PAST MEDICAL HISTORY:  Reviewed.   CURRENT MEDICATIONS:  Reviewed.   PHYSICAL EXAMINATION:  The patient is a pleasant female in no acute  distress.  She is afebrile with stable vital signs.  O2 saturation 99% on room air.  HEENT:  Nasal mucosa is pale with some mild turbinate edema.  Nontender  sinuses.  Posterior pharynx is clear.  NECK:  Is supple without cervical adenopathy.  No JVD.  LUNGS:  Sounds are clear without any wheezing or crackles.  CARDIAC:  Regular rate.  ABDOMEN:  Soft and nontender.  EXTREMITIES:  Warm without any edema.   IMPRESSION/PLAN:  Acute rhinitis and asthma flare.  The patient was  given Xopenex nebulizer treatment in the office.  She is to restart her  Nasacort AQ 2 puffs twice daily and continue on Zyrtec daily.  She was  recommended to use Mucinex DM twice daily for cough and may use  Phenergan DC with codeine #8 oz. 1 to 2 teaspoon every 4 to 6 hours as  needed for cough.  No refills were given.  The patient is to return back  with Dr. Delford Field as scheduled or sooner if needed.      Rubye Oaks, NP  Electronically Signed      Charlcie Cradle Delford Field, MD, [facility]  Electronically Signed   TP/MedQ  DD: 06/28/2006  DT: 06/28/2006  Job #: [id number]

## 2010-08-29 NOTE — Cardiovascular Report (Signed)
NAME:  [name], [name] NO.:  [id number]   MEDICAL RECORD NO.:  [id number]                   PATIENT TYPE:  INP   LOCATION:  2006                                 FACILITY:  [facility]   PHYSICIAN:  Nicki Guadalajara, M.D.                  DATE OF BIRTH:  05-11-1950   DATE OF PROCEDURE:  05/19/2002  DATE OF DISCHARGE:                              CARDIAC CATHETERIZATION   PROCEDURE PERFORMED:  Cardiac catheterization.   CARDIOLOGIST:  Nicki Guadalajara, M.D.   INDICATIONS FOR PROCEDURE:  The patient is a 60 year old African-American  female, patient of Dr. Clarene Duke, who has a history of paroxysmal atrial  fibrillation.  She was admitted to [facility] on May 18, 2002  with recurrent atrial fibrillation.  Additional problems include diabetes,  hypertension and associated chest discomfort.  She is referred for  definitive cardiac catheterization.   DESCRIPTION OF PROCEDURE:  After premedication with Valium 5 mg  intravenously the patient was prepped and draped in usual fashion.  The  right femoral artery was punctured anteriorly and a 6 French sheath was  inserted.  Diagnostic catheterization was done with a 6 French Judkins left  and right coronary catheters.  Pigtail catheter was used for biplane cine  left ventriculography as well as distal aortography.   The patient tolerated the procedure well.  Hemostasis was obtained by direct  manual pressure.   HEMODYNAMIC DATA:  Central aortic pressure was 112/60.  Left ventricular  pressure 112/14.  __________.   ANGIOGRAPHIC DATA:  The left main coronary artery was a long vessel that had  ostial tapering up to 30%.  There was no evidence for any significant  calcification.   The left anterior descending artery had an upward takeoff from the left main  and had 20% ostial smooth narrowing.  A portion of the mid LAD seemed to dip  intramyocardially, but was free of significant obstructive disease.   The  circumflex vessel was a moderate size vessel that gave rise to a small  first marginal vessel, a large OM-2 vessel, which extended to the apex and  several additional small-sized OM-3 and OM-4 vessels.  The circumflex and  its branches were angiographically normal.   The right coronary artery gave rise to the posterior descending artery and  ended in a small distal RCA system beyond the PDA takeoff.  In the mid RCA  there was smooth 10% narrowing after an anterior RV marginal branch.   Biplane cine left ventriculography revealed preserved global LV  contractility with an ejection fraction calculated at 60%.  However, there  was definite focal mid inferior hypocontractility seen on the RAO projection  and on the LAO projection there was definite focal inferolateral  hypocontractility demonstrated.   DISTAL AORTOGRAPHY:  Distal aortography was done due to the patient's  hypertensive history.  There was no renal artery stenosis.  There was  no  significant aortoiliac disease.   IMPRESSION:  1. Normal global left ventricular function with a small area of focal mid     inferior and low posterolateral hypocontractility.  2. No significant coronary obstructive disease; however, there was a smooth     30% ostial tapering of the left  main coronary artery, 20% ostial smooth     narrowing of the left anterior descending artery with a portion of the     mid left anterior descending going intramyocardially; and, smooth 10%     focal narrowing in the right coronary artery just after anterior right     ventricular marginal branch.   RECOMMENDATIONS:  Medical therapy.  The patient is in sinus rhythm.  The  patient will be started on Cardizem, which will be helpful for both rate  control of atrial fibrillation as well as potential for coronary spasm,  which may have played a role in the etiology of her focal segmental wall  motion abnormality.                                                 Nicki Guadalajara, M.D.    TK/MEDQ  D:  05/19/2002  T:  05/20/2002  Job:  [id number]   cc:   Thereasa Solo. Little, M.D.  [address]  [place] [address]  Fax: [phone]   Tamsen Meek, M.D.  Office

## 2010-08-29 NOTE — Consult Note (Signed)
NAME:  [name], [name]                           ACCOUNT NO.:  [id number]   MEDICAL RECORD NO.:  [id number]                   PATIENT TYPE:  INP   LOCATION:  2006                                 FACILITY:  [facility]   PHYSICIAN:  Doylene Canning. Ladona Ridgel, M.D. [facility]           DATE OF BIRTH:  [birth year]   DATE OF CONSULTATION:  DATE OF DISCHARGE:                                   CONSULTATION   EP CONSULTATION NOTE:   REFERRING PHYSICIAN:  Thereasa Solo. Little, M.D.   INDICATION FOR CONSULTATION:  Evaluation of paroxysmal atrial fibrillation  with tachycardia-bradycardia syndrome.   HISTORY OF PRESENT ILLNESS:  The patient is a very pleasant 60 year old  woman with a history of paroxysmal atrial arrhythmias.  She was initially  seen by Dr. Caprice Kluver and placed on digoxin.  She has had increasing  frequency and severity of her symptoms with documented heart rates of nearly  200 beats per minute in atrial fibrillation.  On admission, she had atrial  flutter at ventricular rate of 160 beats per minute and since then has had  episodes of atrial fibrillation.  She is referred now for additional  evaluation as she has developed bradycardia on a combination of Cardizem,  flecainide, digoxin with recently initiated Tikosyn  therapy.  The patient  denies any history of syncope or near syncope.  When she has her atrial  fibrillation, she has chest pressure and minimal shortness of breath.  Her  predominant symptoms is palpitations and the feeling that she is unwell.   PAST MEDICAL HISTORY:  Notable for asthma for which she sees Dr. Danise Mina.  She has not been on Coumadin in the past.   SOCIAL HISTORY:  The patient is married.  She denies cigarette smoking for  over 20 years.  History is notable for her mother dying of diabetic  complications, a father who is alive and well.  She is one of nine siblings.   REVIEW OF SYSTEMS:  Otherwise negative.  She has problems with some tingling  in her lower  extremities.  She denies nausea, vomiting, diarrhea, or  constipation.  She denies any lower extremity weakness or claudication.  She  denies any arthritic symptoms.  She denies any neurologic symptoms.  She  does have a history of intermittent bleeding in her bowels.  The rest of the  review of systems is negative.   PHYSICAL EXAMINATION:  GENERAL:  She is a pleasant, well-appearing, middle-  aged woman in no distress.  VITAL SIGNS:  The blood pressure today was 100/50, the pulse was 56 and  regular, the respirations were 20.  HEENT:  Normocephalic, atraumatic.  The pupils are equal and round.  The  oropharynx is moist.  The sclerae were anicteric.  NECK:  Revealed no jugular venous distention.  There were no carotid bruits  appreciated.  The trachea was midline and the thyroid was not appreciably  enlarged.  LUNGS:  Clear bilaterally to auscultation.  Today, there were no wheezes,  rales, or rhonchi.  CARDIOVASCULAR:  Revealed regular rate and rhythm with normal S1 and S2.  ABDOMEN:  Soft, nontender.  There is no organomegaly.  EXTREMITIES:  Demonstrated no cyanosis, clubbing, or edema.   The EKG today demonstrates normal sinus rhythm (sinus bradycardia) with  corrected QT intervals were approximately 420 msec.  Prior EKGs demonstrated  atypical atrial flutter at a ventricular rate of 160 beats per minute (2:1).  Most of her EKGs, however, demonstrated atrial fibrillation with a variable  ventricular response.   IMPRESSION:  1. Paroxysmal atrial fibrillation.  2. Paroxysmal atrial flutter.  3. Tachycardia-bradycardia syndrome secondary to a predominance of     medications.  4. Hypertension.  5. Diabetes.  6. Segmental wall motion abnormality by left ventriculogram in the absence     of coronary disease (?) spasm-induced myocardial infarction.   DISCUSSION:  I agree because of her paroxysmal atrial arrhythmias, diabetes,  and hypertension, that Coumadin is warranted in this  particular patient who  has no significant contraindications to Coumadin.  I think Tikosyn makes the  most sense from an antiarrhythmic drug prospective, as she has structural  heart disease based on her left ventriculogram, a normal QT, and problems  with bradycardia.  I have recommended, as you have done, to discontinue her  hydrochlorothiazide.  I would watch the patient in the hospital for several  days and initiate Coumadin therapy as you have done.  In time, consideration  for restarting diltiazem would be certainly an option, although I think for  now, just  Tikosyn alone would be warranted.                                               Doylene Canning. Ladona Ridgel, M.D. [facility]    GWT/MEDQ  D:  05/23/2002  T:  05/23/2002  Job:  [id number]   cc:   Thereasa Solo. Little, M.D.  [address]  [place] [address]  Fax: [phone]   Shan Levans, M.D. [facility]  [address]  [place]  [place] [address]  Fax: 1   [name], M.D.  [facility]  [place], [place] [address]  Fax: [phone]

## 2010-08-29 NOTE — H&P (Signed)
[facility]  Patient:    [name], [name] Visit Number: [id number] MRN: [id number]          Service Type: MED Location: 2S 9528 01 Attending Physician:  Caleb Popp Dictated by:   Charlcie Cradle Delford Field, M.D. [facility] Admit Date:  09/27/2001   CC:         [facility]   History and Physical  CHIEF COMPLAINT:  Asthma exacerbation.  HISTORY OF PRESENT ILLNESS:  The patient is a 60 year old African-American female whom I have seen twice in the last week in the office, and she has also been seen in the emergency department on 09/25/01, with an asthma exacerbation. She is now on prednisone 40 mg q.d., also on Advair inhaler, and despite this is having continued symptomatology of wheezing, dyspnea, and dry cough, increased chest congestion.  She is thus admitted to the hospital for further inpatient care.  PAST MEDICAL HISTORY: 1. Medical history of gastroesophageal reflux disease with a Nissen fundoplication in 1998. 2. History of vocal cord dysfunction. 3. History of type 2 diabetes. 4. History of osteopenia on bone Dexa scan in May 2002. 5. History of extrinsic asthma. 6. History of hypertension.  ALLERGIES:  No known drug allergies.  ADMISSION MEDICATIONS:  1. Lanoxin 0.25 mg q.d.  2. Hydrochlorothiazide 50 mg q.d.  3. Zyrtec 10 mg q.d.  4. Singular 10 mg q.d.  5. Protonix 40 mg b.i.d.  6. Advair 250/50 one spray b.i.d.  7. Metformin 500 mg b.i.d.  8. Atacand 16 mg q.d.  9. Celexa 40 mg q.d. 10. Aspirin 81 mg q.d. 11. Calcium q.d. 12. Centrum q.d. 13. Prednisone 40 mg q.d.  SOCIAL HISTORY:  Otherwise noncontributory.  REVIEW OF SYSTEMS:  Noncontributory.  The patient denies any history of smoking.  FAMILY HISTORY:  Significant for diabetes.  PHYSICAL EXAMINATION:  VITAL SIGNS:  Temperature 99, blood pressure 124/80, pulse 73, saturation 99% on room air.  GENERAL:  This is an ill-appearing African-American  female in moderate respiratory distress.  CHEST:  Inspiratory and expiratory wheeze with poor air movement.  CARDIAC:  Regular rate and rhythm without S3, normal S1 and S2.  ABDOMEN:  Soft, nontender, bowel sounds active.  EXTREMITIES:  No edema or clubbing or venous disease.  NEUROLOGIC:  Intact.  HEENT:  No jugular venous distension, lymphadenopathy, oropharynx clear.  NECK:  Supple.  SKIN:  Clear.  IMPRESSION:  It is my impression that the patient does have status asthmaticus with associated vocal cord dysfunction syndrome, gastroesophageal reflux disease, rule out sinusitis, underlying hypertension, and underlying obesity, and gastroesophageal reflux disease.  RECOMMENDATIONS:  We will admit to a regular room, given intensive nebulizer treatments, give IV steroids, IV antibiotics, check sinus CT scan and routine admission labs.Dictated by:   Charlcie Cradle Delford Field, M.D. [facility] Attending Physician:  Caleb Popp DD:  09/27/01 TD:  09/28/01 Job: [id number]

## 2010-08-29 NOTE — Procedures (Signed)
NAME:  [name], [name] NO.:  [id number]   MEDICAL RECORD NO.:  [id number]          PATIENT TYPE:  OUT   LOCATION:  SLEEP CENTER                 FACILITY:  [facility]   PHYSICIAN:  Marcelyn Bruins, M.D. [facility] DATE OF BIRTH:  08-09-1950   DATE OF STUDY:  06/30/2004                              NOCTURNAL POLYSOMNOGRAM   REFERRING PHYSICIAN:  Dr. Shan Levans.   INDICATION FOR THE STUDY:  Hypersomnia with sleep apnea. Epworth score is 4.   SLEEP ARCHITECTURE:  The patient had total sleep time of 238 minutes with a  sleep efficiency of only 50%. There was very decreased REM and slow wave  sleep. Sleep onset latency was prolonged at 45 minutes and REM onset was  normal.   IMPRESSION:  1.  Severe obstructive sleep apnea/hypopnea syndrome with a respiratory      disturbance index of 65 events per hour and oxygen desaturation as low      as 83%. Events were not positional.  2.  Mild to moderate snoring noted throughout the study.  3.  Frequent premature ventricular contractions noted throughout.      KC/MEDQ  D:  07/07/2004 12:05:31  T:  07/07/2004 13:56:22  Job:  [id number]

## 2010-08-29 NOTE — Assessment & Plan Note (Signed)
[facility]                             PULMONARY OFFICE NOTE   MERIDETH, BOSQUE                        MRN:          [id number]  DATE:08/04/2006                            DOB:          17-Apr-1950    [name] is a 60 year old African-American female, history of moderate  persistent asthma, reflux disease, vocal cord dysfunction syndrome,  severe obstructive sleep apnea.  This patient is remarkably improved  with decreased cough, decreased shortness of breath, is not requiring  any Tussionex use, maintaining Singulair at 10 mg daily; Advair 250/50,  1 spray b.i.d., Nexium 40 mg twice daily.  The patient maintains CPAP 11  cm h.s.   PHYSICAL EXAMINATION:  VITAL SIGNS:  Temp 98, blood pressure 126/80,  pulse 73, saturation 99% room air.  CHEST:  Distant breath sounds with prolonged expiratory phase.  No  wheeze or rhonchi were noted.  CARDIAC:  Irregular rate and rhythm without S3, normal S1, S2.  ABDOMEN:  Soft, nontender.  EXTREMITIES:  No edema or clubbing or venous disease.  SKIN:  Clear.  NEUROLOGIC:  Intact.  HEENT:  No jugular venous distention, no lymphadenopathy.  Oropharynx  clear.  Neck supple.   IMPRESSION:  This patient has had a resolved asthmatic bronchitic flare,  stable asthma, and lower airway disease, stable vocal cord dysfunction  and reflux disease.   PLAN:  Maintain inhaled medicines as currently dosed including being  back on the Advair.  Refills were given.  We will see the patient back  in followup in 2 months.     Charlcie Cradle Delford Field, MD, [facility]  Electronically Signed    PEW/MedQ  DD: 08/04/2006  DT: 08/04/2006  Job #: [id number]

## 2010-08-29 NOTE — Procedures (Signed)
NAME:  [name], [name] NO.:  [id number]   MEDICAL RECORD NO.:  [id number]          PATIENT TYPE:  OUT   LOCATION:  SLEEP CENTER                 [facility]ACILITY[facility]  [facility]   PHYSICIAN:  Marcelyn Bruins, M.D. [facility] DATE OF BIRTH:  1951/02/03   DATE OF STUDY:  08/27/2004                              NOCTURNAL POLYSOMNOGRAM   REFERRING PHYSICIAN:  Shan Levans, M.D. [facility].   DATE OF STUDY:  Aug 27, 2004.   INDICATION FOR STUDY:  Hypersomnia with sleep apnea. The patient returns for  pressure optimization.   EPWORTH SCORE:  3.   SLEEP ARCHITECTURE:  The patient had a total sleep time of 328 minutes with  decreased REM and slow wave sleep. Sleep onset latency was normal as was REM  onset. Sleep efficiency was 83%.   IMPRESSION:  1.  Good control of previously diagnosed obstructive sleep apnea with a C-      PAP pressure of 11 cm. The patient used her ResMed Ultra Mirage nasal C-      PAP mask from home throughout the study. There was excellent control      even through REM.  2.  The patient intermittently had an irregular rhythm during the study that      did appear suspicious for atrial fibrillation. She was also noted to      have frequent PVCs.  3.  Large numbers of leg jerks with significant sleep disruption. Clinical      correlation is strongly suggested.      KC/MEDQ  D:  09/11/2004 12:07:58  T:  09/11/2004 22:02:50  Job:  [id number]

## 2010-08-29 NOTE — Consult Note (Signed)
NAMEMarland Kitchen  [name], [name]                 ACCOUNT NO.:  [id number]   MEDICAL RECORD NO.:  [id number]          PATIENT TYPE:  INP   LOCATION:  2920                         FACILITY:  [facility]   PHYSICIAN:  Doylene Canning. Ladona Ridgel, M.D.  DATE OF BIRTH:  February 12, 1951   DATE OF CONSULTATION:  08/21/2005  DATE OF DISCHARGE:                                   CONSULTATION   CONSULTING PHYSICIAN:  Doylene Canning. Ladona Ridgel, M.D.   The consultation is requested by Dr. Gaspar Garbe B. Little, M.D.   INDICATION FOR CONSULTATION:  Evaluation of patient with recurrent atrial  fibrillation on Tikosyn.   HISTORY OF PRESENT ILLNESS:  The patient is a pleasant 60 year old woman  with a history of significant lung disease and nonobstructive coronary  disease and diabetes who initially was placed on Tikosyn back in 2004.  At  that time, she had recurrent symptomatic atrial fibrillation and was not  felt to be a candidate for beta blocker secondary to reactive airway  disease, nor was she thought to be candidate for amiodarone secondary to  that, to the reactive airway disease, her young age, and her underlying lung  disease.  The patient was in her usual state of health until approximately  four days ago.  She notes that prior to this, her atrial fibrillation been  fairly quiet, and she had maintained a sinus rhythm.  She presented to the  hospital with chest pain and rapid atrial fibrillation.  She had brief  episodes of sinus rhythm.  The Tikosyn was discontinued initially.  It was  restarted yesterday.  The patient states that when she is in AFib she feels  some chest pressure and shortness of breath, particularly with activity but  not typically with rest.  She has subsequently had her Coumadin held and  undergone catheterization which demonstrates no significant increase in her  underlying nonobstructive coronary disease along with preserved LV systolic  function.  The patient is now referred for additional evaluation.   PAST  MEDICAL HISTORY:  1.  Notable for diabetes for many years.  2.  Hypertension.  3.  Significant lung disease on multiple inhalers including Xopenex and acid      reflux blockers.   FAMILY HISTORY:  Notable for a father dying of complications of diabetes and  a sister with asthma.   SOCIAL HISTORY:  The patient is widowed has three children.  She denies  tobacco or ethanol use.   REVIEW OF SYSTEMS:  Is notable for no fevers, chills, night sweats.  No  headache, vision, or hearing problems.  No skin rashes or lesions.  Chest  pressure as noted previously and dyspnea with exertion and palpitations.  No  cough, claudication, or hemoptysis.  She denies dysuria, hematuria, or  nocturia.  She denies weakness, numbness, depression.  She denies nausea,  vomiting, or abdominal pain.  She denies polyuria, polydipsia, heat or cold  intolerance, skin problems.  She denies arthralgias or joint swelling or  deformity.  The rest of her review of systems was negative.   PHYSICAL EXAMINATION:  GENERAL:  She is a  pleasant, well-appearing, obese,  middle-aged woman in no acute distress.  VITAL SIGNS:  The blood pressure was 112/70, the pulse was 130 and  irregularly, respirations were 18, temperature was 98.  HEENT:  Normocephalic and atraumatic.  Pupils equal and round.  The  oropharynx was moist.  Sclerae anicteric.  NECK:  Revealed no jugular venous distension.  There is no thyromegaly.  Trachea is midline.  The carotids are 2+ and symmetric.  LUNGS:  Clear bilateral auscultation except for basilar scant wheeze.  There  are no rales or rhonchi.  CARDIAC:  Reveals an irregular tachycardia with normal S1 and S2.  The PMI  was not laterally displaced nor enlarged.  ABDOMEN:  Obese, nontender, nondistended.  There is no organomegaly.  The  bowel sounds are present.  There is no rebound or guarding.  EXTREMITIES:  Demonstrate no cyanosis, clubbing, or edema.  The pulses were  2+ symmetric.  NEUROLOGIC:   Alert x3 with cranial nerves intact.  Strength was 5 out of 5  and symmetric.   EKG demonstrates atrial fibrillation with a rapid ventricular response.  Additional EKGs demonstrate sinus rhythm with a normal QT interval.   IMPRESSION:  1.  Paroxysmal atrial fibrillation.  2.  Chronic Tikosyn therapy.  3.  Asthma.  4.  Diabetes.  5.  Obesity.   DISCUSSION:  At this time, I would recommend restarting her Tikosyn and  continuation of a calcium channel blocker with IV Cardizem used for rate  control.  My expectation is that once she has gets loaded with Tikosyn, she  will go back to a sinus rhythm, if not cardioversion can be attempted.  She  will need to be maintained on heparin and followed by transition back to  Coumadin.  I will plan to follow the patient with you and ultimately she  would be a reasonable candidate for referral for AFib ablation as she is  quite young and has contraindications to other antiarrhythmic drugs.           ______________________________  Doylene Canning. Ladona Ridgel, M.D.     GWT/MEDQ  D:  08/21/2005  T:  08/21/2005  Job:  [id number]   cc:   Thereasa Solo. Little, M.D.  Fax: [phone]

## 2010-08-29 NOTE — Discharge Summary (Signed)
NAME:  [name], [name]                           ACCOUNT NO.:  [id number]   MEDICAL RECORD NO.:  [id number]                   PATIENT TYPE:  INP   LOCATION:  5015                                 FACILITY:  [facility]   PHYSICIAN:  Shan Levans, M.D. [facility]            DATE OF BIRTH:  Mar 02, 1951   DATE OF ADMISSION:  01/14/2002  DATE OF DISCHARGE:  01/18/2002                                 DISCHARGE SUMMARY   DISCHARGE DIAGNOSES:  1. Vocal cord dysfunction with pseudowheezing, cyclic cough; this aggravated     with gastroesophageal reflux disease with a known history of asthma.  2. Diabetes mellitus type 2.  3. Depression.   HISTORY OF PRESENT ILLNESS:  The patient is a 60 year old African American  female who is a pulmonary patient of Dr. Shan Levans.  She has severe  reflux disease and extrinsic asthma and vocal cord dysfunction.  She was  admitted on January 14, 2002 initially by the family practice teaching  service with status asthmatic type symptoms.  Despite aggressive asthma  care, the patient's symptoms had not improved.  The patient had been seen  previously by Dr. Delford Field and at this time he took over her care and changed  over to the pulmonary critical care service.   PAST MEDICAL HISTORY:  Of note, her past medical history consists of type 2  diabetes, history of obesity, gastroesophageal reflux disease.  She  underwent a Nissen fundoplication in 1998 that has failed.  History of  hypertension, chronic arthritis, depression, sinus bradycardia, and she is  nonsmoker.   LABORATORY DATA:  A 12-lead EKG shows atrial fibrillation, rapid ventricular  response with a ventricular rate of 149.  Repeat shows normal sinus rhythm  with a rate of 78.  Arterial blood gases on room air:  pH 7.46, PCO2 of 34,  PO2 of 41 with a bicarb of 25.  WBC 7.7, hemoglobin 12.6, hematocrit 39.3,  and platelets 351.  Sodium 138, potassium 4.4, chloride 101, CO2 of 29,  glucose 185, BUN 11, creatinine  0.9, calcium 9.1.  CK is 200, CK-MB is 2.5,  troponin-I is 0.02.  Digoxin is less than 0.02.   Of note, she had a chest x-ray prior to admission which did not demonstrate  pneumonia.   HOSPITAL COURSE:  1. VOCAL CORD DYSFUNCTION:  The patient was admitted initially to the family     practice teaching service and treated for acute asthma exacerbation.  She     did not respond to treatment.  She has known gastroesophageal reflux     disease with a failed Nissen fundoplication.  She was treated for a     cyclic cough, along with increase in proton pump inhibitor to decrease     acid reflux, and improved with interventions.  She reached maximal     hospital benefit and was discharged home in improved condition.   1. DIABETES MELLITUS:  Note she had a slight exacerbation in her serum     glucose while on steroids.  This improved with a decrease in steroids.   1. HYPERTENSION:  Hypertension remained stable.   1. DEPRESSION:  Depression remained stable during hospitalization.   1. ATRIAL TACHYCARDIA SECONDARY TO ATRIAL FIBRILLATION WHICH WAS A ONE-TIME     COURSE:  Most likely secondary to beta adrenergic stimulation.   DISCHARGE MEDICATIONS:  There is no change in her medications except:  1. Prednisone 40 mg a day x4 days, 30 mg a day x4 days, 20 mg a day x4 days,     10 mg a day x4 days, and then stop.  2. Tequin 400 mg 1 a day until gone.  3. Tussionex 5 cc three times a day for four days, then two times a day as     needed.  4. Tessalon Perles 100 mg four times a day as scheduled.  5. Nexium 40 mg b.i.d.  6. Seroquel 25 mg two times a day.   DIET:  Low salt, no concentrated sweets.   FOLLOW UP:  She is to follow up with Dr. Delford Field in two weeks.   DISPOSITION/CONDITION ON DISCHARGE:  Improved.     Brett Canales Minor, A.C.N.P. [facility]                 Shan Levans, M.D. [facility]    SM/MEDQ  D:  02/08/2002  T:  02/08/2002  Job:  [id number]   cc:   William A. Hensel, M.D.  [address] [place]  [place] [address]  Fax: [phone]

## 2010-08-29 NOTE — Consult Note (Signed)
[place]  Patient:    [name], [name] Visit Number: [id number] MRN: [id number]          Service Type: EMS Location: [place] Attending Physician:  Ilene Qua Dictated by:   Barbaraann Share, M.D. [facility] Proc. Date: 09/25/01 Admit Date:  09/25/2001 Discharge Date: 09/25/2001   CC:         Barbaraann Faster, M.D. (Emergency Room)  Charlcie Cradle. Delford Field, M.D. [facility]   Consultation Report  HISTORY OF PRESENT ILLNESS: The patient is a 60 year old black female with known history of asthma and vocal cord dysfunction, who presented to the emergency room with a 3-4 day history of increasing cough, shortness of breath, and chest tightness. She is normally managed on a fairly aggressive asthma regimen, as well as treatment for GERD and also for cough. She was seen by Dr. Alycia Rossetti in the office approximately two days ago where she was given Tussionex for cough suppression as well as Tessalon pearls. This seemed to be contributing significantly to her symptoms. The patient continued to have difficulties but has not had any purulence, nor does she feel that she has a "chest cold". She came to the emergency where she was treated aggressively with steroids as well as bronchodilators with some improvement. Because of persistent symptoms, I have been asked to evaluate her to see whether or not she needs admission versus discharge home and follow-up next week.  PAST MEDICAL HISTORY: Significant for hypertension, asthma, GERD, and diabetes.  MEDICATIONS: Glucophage XR 500 mg tablets t.i.d., advair 15/250 one puff b.i.d., Protonix 40 mg b.i.d., Singulair 2 mg q.d., Lanoxin 0.25 mg q.d., HCTZ 50 mg q.d., Celexa 40 mg q.d., Atacand 16 mg one q.d., aspirin enteric coated one q.d., and various vitamins.  SOCIAL HISTORY: She is a nonsmoker.  FAMILY HISTORY: Noncontributory.  REVIEW OF SYSTEMS: As per history of present illness.  PHYSICAL EXAM:  GENERAL: She is a well  developed, black female in no acute distress at this time.  VITAL SIGNS: BP 109/51, pulse 101, respiratory rate is now 20, with O2 saturation on room air of 98%. She is afebrile.  HEENT: Pupils equal, round, and reactive to light and accommodation. Extraocular muscles intact. Nasal pharynx without discharge. Oropharynx is clear.  NECK: Supple. No jugular venous distention. No lymphadenopathy. No thyromegaly.  CHEST: Totally clear at this point with excellent air flow. There is some mild pseudo wheezing involving the upper airway which resolves with purse lip breathing and passive exhalation.  CARDIAC: Exam reveals regular rate and rhythm with II/VI systolic ejection murmur.  ABDOMEN: Soft, nontender with good bowel sounds.  GU: Not done and not indicated.  RECTAL: Not done and not indicated.  BREAST: Not done and not indicated.  EXTREMITIES: Lower showed trace edema with good pulses distally. No calf tenderness.  NEURO: Alert and oriented times three with no obvious motor deficits.  LABORATORY DATA: Not done.  DIAGNOSTIC STUDIES: Chest x-ray note done.  IMPRESSION: Status asthmaticus which has responded fairly well to the treatment initiated in the ER and now she is with some residual wheezing that is primarily upper airway in origin. This is related to her vocal cord dysfunction and is being exacerbated by cyclical cough and GERD. The patient is in no respiratory distress and has excellent air flow. She wants to go home at this point in time and I would agree.  PLAN: Will discharge home, however, she is to strictly suppress her cough with the Tussionex and Tessalon  pearls as ordered by Dr. Delford Field. She is also to stay on her b.i.d. proton pump inhibitor as well as her usual asthma medications. She is to call the office tomorrow to get an appointment to see Dr. Delford Field at the end of next week. I will also place her on low dose prednisone, to be quickly tapered in light  of her diabetes and she is to watch her blood sugars as well. Dictated by:   Barbaraann Share, M.D. [facility] Attending Physician:  Ilene Qua DD:  09/25/01 TD:  09/26/01 Job: [id number]

## 2010-09-05 ENCOUNTER — Ambulatory Visit: Payer: Medicare Other | Admitting: Family Medicine

## 2010-09-17 ENCOUNTER — Ambulatory Visit: Payer: Medicare Other | Admitting: Family Medicine

## 2010-09-29 ENCOUNTER — Other Ambulatory Visit: Payer: Self-pay | Admitting: Family Medicine

## 2010-09-29 NOTE — Telephone Encounter (Signed)
Refill request

## 2010-09-30 ENCOUNTER — Ambulatory Visit (INDEPENDENT_AMBULATORY_CARE_PROVIDER_SITE_OTHER): Payer: Medicare Other | Admitting: Family Medicine

## 2010-09-30 ENCOUNTER — Encounter: Payer: Self-pay | Admitting: Family Medicine

## 2010-09-30 ENCOUNTER — Other Ambulatory Visit: Payer: Self-pay | Admitting: Family Medicine

## 2010-09-30 VITALS — BP 115/81 | HR 88 | Ht 66.0 in | Wt 205.0 lb

## 2010-09-30 DIAGNOSIS — E119 Type 2 diabetes mellitus without complications: Secondary | ICD-10-CM

## 2010-09-30 DIAGNOSIS — I509 Heart failure, unspecified: Secondary | ICD-10-CM

## 2010-09-30 NOTE — Patient Instructions (Addendum)
Please follow up in 3 months for diabetes, cholesterol, heart failure. Weigh yourself everyday.  If your weight is 205 lb or more, call us because we may need to make changes to your Torsemide.   If your weight is 210 or more, we are really concerned that you may need to be hospitalized.  [facility] 16 units each night.  Limit water to 1.5 Liter per day.

## 2010-09-30 NOTE — Progress Notes (Signed)
  Subjective:    Patient ID: [name], female    DOB: Aug 12, 1950, 60 y.o.   MRN: [id number]  HPI DIABETES Pt states that she has not been able to check your sugar because she has been out of strips x 2 wks. She state that the pharmacy has sent forms to our clinic without reply.  She states that she call our clinic last week and spoke to someone regarding this issue I asked pt to call med supply company to verify this information during our visit.   CBGs: 91-143; ave 110-120s, highest number was 142 and 143 which only occurred twice, lowest number is 91 and 94 which occurred twice.  Meds: Lantus 15 units qhs, Glipizide 5mg  bid  Chest pain: no Headache:no Dyspnea:no Symptoms of hypoglycemia:no   Review of Systems Negative except per hpi     Objective:   Physical Exam  Constitutional: She is oriented to person, place, and time. She appears well-developed and well-nourished.  Neurological: She is alert and oriented to person, place, and time.          Assessment & Plan:

## 2010-09-30 NOTE — Assessment & Plan Note (Addendum)
Weigth of 6lb from 07/2010.  Her weight at home was 200lb today and 200lb yesterday. Limit water intake to 1.5L per day.  Pt to check her weight daily.  We will keep 200lb as baseline. If she weighs 205, she should call us to adjust torsemide dose (cardiologist told her to increase dose to 1.5x normal dose).  If her weight is 210lb then pt needs to see [place], she understands that she may need hospitalization to diurese the fluid in that situation.

## 2010-10-01 ENCOUNTER — Telehealth: Payer: Self-pay | Admitting: Family Medicine

## 2010-10-01 MED ORDER — INSULIN GLARGINE 100 UNIT/ML ~~LOC~~ SOLN: 16.0000 [IU] | Freq: Every day | SUBCUTANEOUS | Status: DC

## 2010-10-01 NOTE — Assessment & Plan Note (Signed)
A1C today is 7.3, which is much improved from 8.3 in 05/2010.  Pt has states in the past many times that she does not want to take insulin. She was well controlled for many years on orals.  After discussing with pt and her daughter, pt opted to remain on Lantus.  Since that is the case, I will Rx Lantus pen with 16 units qhs.

## 2010-10-01 NOTE — Telephone Encounter (Signed)
Refill request

## 2010-10-01 NOTE — Telephone Encounter (Signed)
Called pt to let her know that I have not received the fax from her diabetic supply company.  She will call them again.  I gave her our fax number [phone]

## 2010-10-17 ENCOUNTER — Other Ambulatory Visit: Payer: Self-pay | Admitting: Family Medicine

## 2010-10-17 NOTE — Telephone Encounter (Signed)
Refill request

## 2010-10-20 ENCOUNTER — Other Ambulatory Visit: Payer: Self-pay | Admitting: Family Medicine

## 2010-10-20 NOTE — Telephone Encounter (Signed)
Refill request

## 2010-10-24 ENCOUNTER — Other Ambulatory Visit: Payer: Self-pay | Admitting: Family Medicine

## 2010-10-24 NOTE — Telephone Encounter (Signed)
Refill request

## 2010-11-05 ENCOUNTER — Other Ambulatory Visit: Payer: Self-pay | Admitting: Family Medicine

## 2010-11-05 NOTE — Telephone Encounter (Signed)
Refill Request.  

## 2010-11-06 ENCOUNTER — Other Ambulatory Visit: Payer: Self-pay | Admitting: Family Medicine

## 2010-11-06 NOTE — Telephone Encounter (Signed)
Refill request

## 2010-11-07 ENCOUNTER — Encounter: Payer: Self-pay | Admitting: Family Medicine

## 2010-11-07 ENCOUNTER — Other Ambulatory Visit: Payer: Self-pay | Admitting: Family Medicine

## 2010-11-07 NOTE — Telephone Encounter (Signed)
Refill request

## 2010-11-12 ENCOUNTER — Other Ambulatory Visit: Payer: Self-pay | Admitting: Family Medicine

## 2010-11-12 NOTE — Telephone Encounter (Signed)
Refill request

## 2010-11-25 ENCOUNTER — Encounter: Payer: Self-pay | Admitting: *Deleted

## 2010-11-28 ENCOUNTER — Telehealth: Payer: Self-pay | Admitting: *Deleted

## 2010-11-28 NOTE — Telephone Encounter (Signed)
Noted in paceart.  

## 2010-11-28 NOTE — Telephone Encounter (Signed)
Pt called and stated she received letter about device ck. And she is being followed by Dr. Clarene Duke at [facility].

## 2010-12-01 ENCOUNTER — Other Ambulatory Visit: Payer: Self-pay | Admitting: Family Medicine

## 2010-12-01 MED ORDER — DIGOXIN 125 MCG PO TABS: 62.5000 ug | ORAL_TABLET | Freq: Every day | ORAL | Status: DC

## 2010-12-08 ENCOUNTER — Ambulatory Visit (HOSPITAL_BASED_OUTPATIENT_CLINIC_OR_DEPARTMENT_OTHER)
Admission: RE | Admit: 2010-12-08 | Discharge: 2010-12-08 | Disposition: A | Discharge status: Home / Self Care | Payer: Medicare Other | Source: Ambulatory Visit | Attending: Ophthalmology | Admitting: Ophthalmology

## 2010-12-08 DIAGNOSIS — H0019 Chalazion unspecified eye, unspecified eyelid: Secondary | ICD-10-CM | POA: Insufficient documentation

## 2010-12-11 ENCOUNTER — Other Ambulatory Visit: Payer: Self-pay | Admitting: Family Medicine

## 2010-12-11 NOTE — Telephone Encounter (Signed)
Refill request

## 2011-01-06 ENCOUNTER — Ambulatory Visit (INDEPENDENT_AMBULATORY_CARE_PROVIDER_SITE_OTHER): Payer: Medicare Other | Admitting: Family Medicine

## 2011-01-06 ENCOUNTER — Encounter: Payer: Self-pay | Admitting: Family Medicine

## 2011-01-06 VITALS — BP 122/82 | HR 89 | Wt 205.8 lb

## 2011-01-06 DIAGNOSIS — E7800 Pure hypercholesterolemia, unspecified: Secondary | ICD-10-CM

## 2011-01-06 DIAGNOSIS — Z23 Encounter for immunization: Secondary | ICD-10-CM

## 2011-01-06 DIAGNOSIS — D649 Anemia, unspecified: Secondary | ICD-10-CM

## 2011-01-06 DIAGNOSIS — I509 Heart failure, unspecified: Secondary | ICD-10-CM

## 2011-01-06 DIAGNOSIS — E119 Type 2 diabetes mellitus without complications: Secondary | ICD-10-CM

## 2011-01-06 DIAGNOSIS — I10 Essential (primary) hypertension: Secondary | ICD-10-CM

## 2011-01-06 DIAGNOSIS — I4891 Unspecified atrial fibrillation: Secondary | ICD-10-CM

## 2011-01-06 NOTE — Progress Notes (Signed)
  Subjective:    Patient ID: [name], female    DOB: Aug 10, 1950, 60 y.o.   MRN: [id number]  Diabetes Pertinent negatives for diabetes include no chest pain.   60 year old pleasant lady here today for followup for DM-2, CHF, A-fib, hyperlipidemia, and HTN.  Diabetes: Patient states that she is doing well.  No complaints.  She checks her blood sugar once a day (fasting am).  Readings range from 90-120 with an occasion elevated reading in the 150's.  Patient continues to take 16 units of lantus at bedtime and glipizide 5 mg bid.  She reports no low blood sugars.  She is also watching her diet and closely monitors her salt intake.  CHF: She reports that her CHF is stable at this time.  No recent weight gain.  No SOB or ankle/leg swelling. Her cardiologist follows her for her CHF and A-Fib.  Currently on Beta blocker, digoxin, amiodarone, spironolactone, and torsemide.  On coumadin for anticoagulation.  Hyperlipidemia: She continues to take Pravastatin for hyperlipidemia.  She is tolerating the medication well.     Review of Systems  Constitutional: Negative for unexpected weight change.  Respiratory: Negative for shortness of breath.   Cardiovascular: Negative for chest pain.  Gastrointestinal: Negative for diarrhea and constipation.  Genitourinary: Negative for enuresis and difficulty urinating.       Objective:   Physical Exam  Constitutional: She is oriented to person, place, and time. She appears well-developed.  Cardiovascular: Normal rate, regular rhythm and normal heart sounds.   Pulmonary/Chest: Effort normal and breath sounds normal. She has no wheezes. She has no rales.  Abdominal: Soft. She exhibits no distension. There is no tenderness.  Musculoskeletal: She exhibits no edema.  Neurological: She is alert and oriented to person, place, and time.  Skin: No rash noted.  Psychiatric: She has a normal mood and affect. Her behavior is normal.  Extremities:  No evidence of  lower extremity edema.          Assessment & Plan:

## 2011-01-06 NOTE — Patient Instructions (Addendum)
Continue to take medications as directed.  Make a lab appointment to come back for cholesterol test and other blood work.  HTN: Your blood pressure looks great!  Keep taking your medications as you are already doing!  DM: Continue checking Blood sugars.  Great Job with this!  We will check your A1c today.  I will mail you the results.  CHF: Continue daily weights,  F/up with cardiology as you are doing.   Return in 3 months for follow up

## 2011-01-09 LAB — COMPREHENSIVE METABOLIC PANEL
ALT: 37 — ABNORMAL HIGH
Albumin: 3.1 — ABNORMAL LOW
BUN: 8
Calcium: 8.2 — ABNORMAL LOW
Calcium: 8.7
GFR calc Af Amer: >60
Glucose, Bld: 110 — ABNORMAL HIGH
Glucose, Bld: 111 — ABNORMAL HIGH
Sodium: 139
Total Protein: 6.1
Total Protein: 6.9

## 2011-01-09 LAB — CBC
HCT: 34 — ABNORMAL LOW
HCT: 34.6 — ABNORMAL LOW
HCT: 35 — ABNORMAL LOW
HCT: 35.5 — ABNORMAL LOW
Hemoglobin: 11 — ABNORMAL LOW
Hemoglobin: 11.2 — ABNORMAL LOW
Hemoglobin: 11.8 — ABNORMAL LOW
MCHC: 32.3
MCV: 89.9
MCV: 90.3
RBC: 3.85 — ABNORMAL LOW
RBC: 3.88
RBC: 3.98
RDW: 14.4
WBC: 6
WBC: 6.2

## 2011-01-09 LAB — POCT I-STAT, CHEM 8
BUN: 10
Chloride: 106
Creatinine, Ser: 1
Potassium: 4
Sodium: 141

## 2011-01-09 LAB — ABO/RH: ABO/RH(D): B POS

## 2011-01-09 LAB — DIFFERENTIAL
Lymphocytes Relative: 42
Monocytes Absolute: 0.4
Monocytes Relative: 8
Neutro Abs: 2.6

## 2011-01-09 LAB — PROTIME-INR
INR: 1.8 — ABNORMAL HIGH
Prothrombin Time: 21.8 — ABNORMAL HIGH

## 2011-01-09 LAB — PREPARE FRESH FROZEN PLASMA

## 2011-01-13 ENCOUNTER — Other Ambulatory Visit: Payer: Self-pay | Admitting: Family Medicine

## 2011-01-13 ENCOUNTER — Other Ambulatory Visit (INDEPENDENT_AMBULATORY_CARE_PROVIDER_SITE_OTHER): Payer: Medicare Other

## 2011-01-13 DIAGNOSIS — E7800 Pure hypercholesterolemia, unspecified: Secondary | ICD-10-CM

## 2011-01-13 DIAGNOSIS — D649 Anemia, unspecified: Secondary | ICD-10-CM

## 2011-01-13 DIAGNOSIS — I10 Essential (primary) hypertension: Secondary | ICD-10-CM

## 2011-01-13 DIAGNOSIS — E119 Type 2 diabetes mellitus without complications: Secondary | ICD-10-CM

## 2011-01-13 LAB — LIPID PANEL
HDL: 51 mg/dL (ref >39)
LDL Cholesterol: 93 mg/dL (ref 0–99)
Total CHOL/HDL Ratio: 3.1 Ratio
Triglycerides: 70 mg/dL (ref <150)
VLDL: 14 mg/dL (ref 0–40)

## 2011-01-13 LAB — COMPREHENSIVE METABOLIC PANEL
ALT: 37 U/L — ABNORMAL HIGH (ref 0–35)
AST: 26 U/L (ref 0–37)
CO2: 28 mEq/L (ref 19–32)
Creat: 1.19 mg/dL — ABNORMAL HIGH (ref 0.50–1.10)
Total Bilirubin: 0.4 mg/dL (ref 0.3–1.2)

## 2011-01-13 LAB — CBC
HCT: 38 % (ref 36.0–46.0)
MCV: 92.7 fL (ref 78.0–100.0)
RDW: 14.2 % (ref 11.5–15.5)
WBC: 4.3 10*3/uL (ref 4.0–10.5)

## 2011-01-13 NOTE — Assessment & Plan Note (Signed)
Well controlled on current regimen of cozaar 50mg  po daily, lopressor 50mg  po bid, torsemide 20mg  po daily, spirolactone 12.5 mg daily. bp 122/80 today. Pt encouraged to continue to exercise and eat healthy diet.

## 2011-01-13 NOTE — Progress Notes (Signed)
LABS DRAWN FOR CBC,LIPID,CMET CNEWSOME

## 2011-01-13 NOTE — Assessment & Plan Note (Signed)
Well controlled, torsemide 20mg  po daily, continue daily weights.

## 2011-01-13 NOTE — Assessment & Plan Note (Signed)
Continue glucotrol 5mg  po bid, and lantus 16 units at bedtime. Last A1C 7.3.

## 2011-01-13 NOTE — Assessment & Plan Note (Signed)
Pt states that cards prescribes digoxin- but refill request was sent to [place] last time.  If we continue to refill will need to monitor periodic digoxin levels and pt heart rate.

## 2011-01-13 NOTE — Progress Notes (Signed)
Addended by: Kristen Cardinal on: 01/13/2011 10:04 PM   Modules accepted: Orders

## 2011-01-13 NOTE — Assessment & Plan Note (Signed)
Will recheck cbc at today's appointment.

## 2011-01-13 NOTE — Assessment & Plan Note (Signed)
Will recheck lipid panel at today's appt.

## 2011-02-02 ENCOUNTER — Other Ambulatory Visit: Payer: Self-pay | Admitting: Family Medicine

## 2011-02-02 NOTE — Telephone Encounter (Signed)
Refill request

## 2011-02-03 ENCOUNTER — Telehealth: Payer: Self-pay | Admitting: Family Medicine

## 2011-02-03 NOTE — Telephone Encounter (Signed)
Patient dropped off handicapped placard form to be filled out.  Please call her when completed. °

## 2011-02-03 NOTE — Telephone Encounter (Signed)
Handicapped Placard application placed in Dr. Tobias Alexander box for signature.  [name]

## 2011-02-05 NOTE — Telephone Encounter (Signed)
Handicap placard completed and signed on 02/03/11-  This was placed in the "to be picked up by pt" pile 02/03/11.

## 2011-02-18 DIAGNOSIS — I509 Heart failure, unspecified: Secondary | ICD-10-CM | POA: Insufficient documentation

## 2011-02-23 ENCOUNTER — Other Ambulatory Visit: Payer: Self-pay | Admitting: Family Medicine

## 2011-02-23 ENCOUNTER — Telehealth: Payer: Self-pay | Admitting: Family Medicine

## 2011-02-23 MED ORDER — TORSEMIDE 10 MG PO TABS: 20.0000 mg | ORAL_TABLET | Freq: Every day | ORAL | Status: DC

## 2011-02-23 MED ORDER — SPIRONOLACTONE 25 MG PO TABS: 12.5000 mg | ORAL_TABLET | Freq: Every day | ORAL | Status: DC

## 2011-02-23 NOTE — Telephone Encounter (Signed)
[name] is calling because her [facility] [address] have not been filled yet.  They are for Spironolactone and Torremide.  I let the patient know they are in Dr. Edmonia James box.  She is completely out and would appreciate a fast response.

## 2011-02-26 ENCOUNTER — Other Ambulatory Visit: Payer: Self-pay | Admitting: Family Medicine

## 2011-02-26 DIAGNOSIS — Z1231 Encounter for screening mammogram for malignant neoplasm of breast: Secondary | ICD-10-CM

## 2011-03-01 ENCOUNTER — Other Ambulatory Visit: Payer: Self-pay | Admitting: Family Medicine

## 2011-03-02 NOTE — Telephone Encounter (Signed)
Refill request

## 2011-03-07 ENCOUNTER — Encounter: Payer: Self-pay | Admitting: Family Medicine

## 2011-03-07 ENCOUNTER — Other Ambulatory Visit: Payer: Self-pay | Admitting: Family Medicine

## 2011-03-07 DIAGNOSIS — I10 Essential (primary) hypertension: Secondary | ICD-10-CM

## 2011-03-07 MED ORDER — SPIRONOLACTONE 25 MG PO TABS: 12.5000 mg | ORAL_TABLET | Freq: Every day | ORAL | Status: DC

## 2011-03-07 MED ORDER — METOPROLOL TARTRATE 50 MG PO TABS: 50.0000 mg | ORAL_TABLET | Freq: Two times a day (BID) | ORAL | Status: DC

## 2011-03-07 MED ORDER — TORSEMIDE 10 MG PO TABS: 20.0000 mg | ORAL_TABLET | Freq: Every day | ORAL | Status: DC

## 2011-03-09 ENCOUNTER — Other Ambulatory Visit: Payer: Self-pay | Admitting: Family Medicine

## 2011-03-09 NOTE — Telephone Encounter (Signed)
Refill request

## 2011-03-11 NOTE — Telephone Encounter (Signed)
Refilled already on 11/14- see medication list.

## 2011-03-12 ENCOUNTER — Other Ambulatory Visit: Payer: Self-pay | Admitting: Family Medicine

## 2011-03-12 NOTE — Telephone Encounter (Signed)
Refill request

## 2011-03-18 ENCOUNTER — Encounter: Payer: Self-pay | Admitting: Family Medicine

## 2011-03-18 ENCOUNTER — Telehealth: Payer: Self-pay | Admitting: Family Medicine

## 2011-03-18 NOTE — Telephone Encounter (Signed)
Forward to PCP for refill request 

## 2011-03-18 NOTE — Telephone Encounter (Signed)
I have sent in RX refill for 3 months but pt needs to return for follow up appt, so that I can follow up on chronic problems as well as recheck blood work.  She needs blood work at least every 6 months when on the medications that she is on. Please call pt to notify of rx being refilled, and to schedule an appt.

## 2011-03-18 NOTE — Telephone Encounter (Signed)
States that the pharmacy has sent a request for her Digoxin has not heard back.  Needs [facility]ap [facility] [address]

## 2011-03-19 ENCOUNTER — Other Ambulatory Visit: Payer: Self-pay | Admitting: Critical Care Medicine

## 2011-03-25 ENCOUNTER — Encounter: Payer: Self-pay | Admitting: Family Medicine

## 2011-04-01 ENCOUNTER — Other Ambulatory Visit: Payer: Self-pay | Admitting: Family Medicine

## 2011-04-01 MED ORDER — GLIPIZIDE 5 MG PO TABS: 5.0000 mg | ORAL_TABLET | Freq: Two times a day (BID) | ORAL | Status: DC

## 2011-04-02 ENCOUNTER — Ambulatory Visit
Admission: RE | Admit: 2011-04-02 | Discharge: 2011-04-02 | Disposition: A | Discharge status: Home / Self Care | Payer: Medicare Other | Source: Ambulatory Visit | Attending: Family Medicine | Admitting: Family Medicine

## 2011-04-02 DIAGNOSIS — Z1231 Encounter for screening mammogram for malignant neoplasm of breast: Secondary | ICD-10-CM

## 2011-04-10 ENCOUNTER — Ambulatory Visit (INDEPENDENT_AMBULATORY_CARE_PROVIDER_SITE_OTHER): Payer: Medicare Other | Admitting: Family Medicine

## 2011-04-10 ENCOUNTER — Encounter: Payer: Self-pay | Admitting: Family Medicine

## 2011-04-10 DIAGNOSIS — J45909 Unspecified asthma, uncomplicated: Secondary | ICD-10-CM

## 2011-04-10 DIAGNOSIS — E7800 Pure hypercholesterolemia, unspecified: Secondary | ICD-10-CM

## 2011-04-10 DIAGNOSIS — E119 Type 2 diabetes mellitus without complications: Secondary | ICD-10-CM

## 2011-04-10 DIAGNOSIS — I4891 Unspecified atrial fibrillation: Secondary | ICD-10-CM

## 2011-04-10 DIAGNOSIS — I10 Essential (primary) hypertension: Secondary | ICD-10-CM

## 2011-04-10 DIAGNOSIS — D649 Anemia, unspecified: Secondary | ICD-10-CM

## 2011-04-10 DIAGNOSIS — G4730 Sleep apnea, unspecified: Secondary | ICD-10-CM

## 2011-04-10 DIAGNOSIS — I5022 Chronic systolic (congestive) heart failure: Secondary | ICD-10-CM

## 2011-04-10 NOTE — Patient Instructions (Signed)
Diabetes: Continue to check 1-2 per day.  Check fasting.  2-3 x per week check 2 hours after a meal  Heart Health: I will send a letter to Dr. Clarene Duke asking if toprol would be a better choice of betablocker for you instead of lopressor.  Discuss this at your next appt with him.   Anemia: Your hbg is 12.3- you can stop your iron supplement now.   Asthma: Since you have not had any flares and you aren't using the steroid inhaler. You can stop the pulmicort.  If any further problems we can restart this.   Return in 3-4 months for follow up appointment.  Please bring Blood glucose log book.

## 2011-04-10 NOTE — Progress Notes (Signed)
  Subjective:    Patient ID: [name], female    DOB: 11-Jan-1951, 60 y.o.   MRN: [id number]  HPI  Diabetes: Patient taking fasting blood sugar one time per day. Not taking any postprandials. States fasting usually is in low 100s. Using medications as directed including Lantus 15 units daily at bedtime. Also taking Glucotrol 5 mg by mouth twice a day.  CHF/A. fib: Followed by Dr. Clarene Duke at [facility] and also Dr. Orson Aloe at [facility]. Recently switched back to tikosyn. And amiodarone stopped. Patient states she would like cardiologist to manage to managed digoxin. She is unsure why prescription request was sent to my office.  Anemia: Reviewed hemoglobin from last appointment 12.3. Patient has been taking iron twice a day. Would like to stop iron.  Asthma: Patient states she has not had any wheezing. Has not had any asthma exacerbation over a year. Has not been using her Pulmicort inhaler for the past 8 months. States she doesn't feel she needs it. Has not needed Xopenex but does have prescription home in case she were to need it. Use be followed by Dr. Delford Field for asthma but no longer sees him since she hasn't had any problems.  Review of Systems No chest pain. No shortness of breath. No fever. No cold symptoms. No palpitations.    Objective:   Physical Exam  Constitutional: She is oriented to person, place, and time. She appears well-developed and well-nourished.  HENT:  Head: Normocephalic and atraumatic.  Eyes: Right eye exhibits no discharge. Left eye exhibits no discharge.  Neck: Normal range of motion. No thyromegaly present.  Cardiovascular: Normal rate and normal heart sounds.        Irregular heart rhythm  Pulmonary/Chest: Effort normal. No respiratory distress. She has no wheezes. She has no rales. She exhibits no tenderness.  Abdominal: Soft. She exhibits no distension.  Musculoskeletal: She exhibits no edema.  Neurological: She is alert and oriented to person,  place, and time.  Skin: No rash noted.  Psychiatric: She has a normal mood and affect. Her behavior is normal.          Assessment & Plan:

## 2011-04-21 MED ORDER — DOFETILIDE 250 MCG PO CAPS: 250.0000 ug | ORAL_CAPSULE | Freq: Two times a day (BID) | ORAL | Status: DC

## 2011-04-21 NOTE — Assessment & Plan Note (Signed)
Has cpap machine- uses approx 2 x per month.  Encouraged pt to use machine.  Discussed long term consequences.

## 2011-04-21 NOTE — Assessment & Plan Note (Signed)
Pt requests that digoxin be monitored and dosed by cardiology.  In the future if refill reqest come in for dig will have them forward to cards.  Rate controlled at today's appt.

## 2011-04-21 NOTE — Assessment & Plan Note (Signed)
BMET 1.19 in 01/2011. Some slight increase in baseline creatinine. on Losartan Blood glucose check-in am only, no pp checks.  Pt encouraged to occasionally check pp levels Medications- lantus 15 and glucotrol 5 mg bid.  Current A1C-- 7.3

## 2011-04-21 NOTE — Assessment & Plan Note (Signed)
hgb last check was 12.3- told pt ok to stop iron supplement at this time

## 2011-04-21 NOTE — Assessment & Plan Note (Addendum)
LDL 93 at 01/2011 check-  Continue pravastatin 20mg  po daily.  --discuss health modifications and possibly increaseing dose at next appt.

## 2011-04-21 NOTE — Assessment & Plan Note (Signed)
Well controlled on current regimen of cozaar 50mg  po daily, lopressor 50mg  po bid, torsemide 20mg  po daily, spirolactone 12.5 mg daily. bp 130/80 today. Pt encouraged to continue to exercise and eat healthy diet.

## 2011-04-21 NOTE — Assessment & Plan Note (Signed)
Pt denies asthma exacerbation in 1-2 years.  Stopped using steroid inhaler approx 8 months ago.  Has not had any asthma syptoms.  Pt would like to d/c steroid inhaler.  Low threshold to restart if any further problems with asthma in future.

## 2011-04-21 NOTE — Assessment & Plan Note (Addendum)
On review of pt record with clinic pharmacist- they suggested that current research supports that toprol 100mg  po daily has better evidence for heart failure-  Versus lopressor- will forward this clinic note to pt cardilogist at  [facility] to see if they agree with this change.  Pt is very motivated appears to be adherent to recommended therapy and checks daily weights.

## 2011-04-22 DIAGNOSIS — Z79899 Other long term (current) drug therapy: Secondary | ICD-10-CM | POA: Diagnosis not present

## 2011-04-22 DIAGNOSIS — Z8674 Personal history of sudden cardiac arrest: Secondary | ICD-10-CM | POA: Diagnosis not present

## 2011-04-22 DIAGNOSIS — I509 Heart failure, unspecified: Secondary | ICD-10-CM | POA: Diagnosis not present

## 2011-04-22 DIAGNOSIS — I4949 Other premature depolarization: Secondary | ICD-10-CM | POA: Diagnosis not present

## 2011-04-22 DIAGNOSIS — Z7901 Long term (current) use of anticoagulants: Secondary | ICD-10-CM | POA: Diagnosis not present

## 2011-04-22 DIAGNOSIS — Z95810 Presence of automatic (implantable) cardiac defibrillator: Secondary | ICD-10-CM | POA: Diagnosis not present

## 2011-04-22 DIAGNOSIS — I4891 Unspecified atrial fibrillation: Secondary | ICD-10-CM | POA: Diagnosis not present

## 2011-04-22 DIAGNOSIS — I499 Cardiac arrhythmia, unspecified: Secondary | ICD-10-CM | POA: Diagnosis not present

## 2011-04-23 DIAGNOSIS — I4891 Unspecified atrial fibrillation: Secondary | ICD-10-CM | POA: Diagnosis not present

## 2011-04-23 DIAGNOSIS — Z7901 Long term (current) use of anticoagulants: Secondary | ICD-10-CM | POA: Diagnosis not present

## 2011-04-24 ENCOUNTER — Other Ambulatory Visit: Payer: Self-pay | Admitting: Family Medicine

## 2011-04-24 DIAGNOSIS — F419 Anxiety disorder, unspecified: Secondary | ICD-10-CM

## 2011-04-24 MED ORDER — BUPROPION HCL ER (XL) 150 MG PO TB24: 150.0000 mg | ORAL_TABLET | Freq: Every day | ORAL | Status: DC

## 2011-05-21 DIAGNOSIS — I509 Heart failure, unspecified: Secondary | ICD-10-CM | POA: Diagnosis not present

## 2011-05-21 DIAGNOSIS — I495 Sick sinus syndrome: Secondary | ICD-10-CM | POA: Diagnosis not present

## 2011-05-21 DIAGNOSIS — Z7901 Long term (current) use of anticoagulants: Secondary | ICD-10-CM | POA: Diagnosis not present

## 2011-05-21 DIAGNOSIS — I4891 Unspecified atrial fibrillation: Secondary | ICD-10-CM | POA: Diagnosis not present

## 2011-05-26 ENCOUNTER — Other Ambulatory Visit: Payer: Self-pay | Admitting: Critical Care Medicine

## 2011-05-27 ENCOUNTER — Telehealth: Payer: Self-pay | Admitting: Critical Care Medicine

## 2011-05-27 MED ORDER — LORATADINE 10 MG PO TABS: 10.0000 mg | ORAL_TABLET | Freq: Every day | ORAL | Status: DC

## 2011-05-27 NOTE — Telephone Encounter (Signed)
Pt returned call per phone message from 05/27/11 - OV has been scheduled and rx sent.

## 2011-05-27 NOTE — Telephone Encounter (Signed)
Gweneth Dimitri, RN 05/27/2011 9:20 AM Signed  Pt was last seen by PW on 06/12/2010 - has no pending appts. lmomtcb to schedule OV with PW then will send rx in.  Pt has scheduled apt and is aware she needs to keep OV in order to get further refills. Pt voiced her understanding and needed nothing further

## 2011-05-27 NOTE — Telephone Encounter (Signed)
Pt was last seen by PW on 06/12/2010 - has no pending appts.  lmomtcb to schedule OV with PW then will send rx in.

## 2011-05-28 ENCOUNTER — Other Ambulatory Visit: Payer: Self-pay | Admitting: Critical Care Medicine

## 2011-05-29 NOTE — Telephone Encounter (Signed)
Rx was sent on 05/27/11.    [facility], spoke with Jill Alexanders who verified the rx was received and pt has picked it up.  Nothing further needed.

## 2011-06-15 ENCOUNTER — Other Ambulatory Visit: Payer: Self-pay | Admitting: Family Medicine

## 2011-06-15 NOTE — Telephone Encounter (Signed)
Refill request

## 2011-06-19 ENCOUNTER — Other Ambulatory Visit: Payer: Self-pay | Admitting: Critical Care Medicine

## 2011-06-19 DIAGNOSIS — Z7901 Long term (current) use of anticoagulants: Secondary | ICD-10-CM | POA: Diagnosis not present

## 2011-06-19 DIAGNOSIS — I4891 Unspecified atrial fibrillation: Secondary | ICD-10-CM | POA: Diagnosis not present

## 2011-06-23 ENCOUNTER — Encounter: Payer: Self-pay | Admitting: Family Medicine

## 2011-06-23 DIAGNOSIS — Z95810 Presence of automatic (implantable) cardiac defibrillator: Secondary | ICD-10-CM | POA: Insufficient documentation

## 2011-06-26 ENCOUNTER — Encounter: Payer: Self-pay | Admitting: Critical Care Medicine

## 2011-06-26 ENCOUNTER — Ambulatory Visit (INDEPENDENT_AMBULATORY_CARE_PROVIDER_SITE_OTHER): Payer: Medicare Other | Admitting: Critical Care Medicine

## 2011-06-26 VITALS — BP 140/90 | HR 63 | Temp 97.4°F | Ht 67.0 in | Wt 208.6 lb

## 2011-06-26 DIAGNOSIS — J45909 Unspecified asthma, uncomplicated: Secondary | ICD-10-CM | POA: Diagnosis not present

## 2011-06-26 MED ORDER — BECLOMETHASONE DIPROPIONATE 40 MCG/ACT IN AERS: 2.0000 | INHALATION_SPRAY | Freq: Two times a day (BID) | RESPIRATORY_TRACT | Status: DC

## 2011-06-26 MED ORDER — LEVALBUTEROL TARTRATE 45 MCG/ACT IN AERO: 2.0000 | INHALATION_SPRAY | Freq: Four times a day (QID) | RESPIRATORY_TRACT | Status: DC | PRN

## 2011-06-26 NOTE — Assessment & Plan Note (Signed)
Moderate persistent asthma with discontinuance of and steroid inhaler over the past year by primary care at this time the patient however using her Xopenex inhaler 3-4 times daily Plan Resume inhaled steroid Qvar 2 puff twice daily Use Xopenex as needed only

## 2011-06-26 NOTE — Progress Notes (Signed)
Subjective:    Patient ID: [name], female    DOB: November 20, 1950, 61 y.o.   MRN: [id number]  HPI  June 12, 2010 3:16 PM  Since last ov pt was admitted for CHF exac 1/27 - 1/31  Since last ov is better. Pt had PT in the home and helps. Now no cough. No edema in the feet. Had fluid removed.  No real chest pain. Defib placed 1/12 .   06/26/2011 Since last ov rx amiodarone then off since 11/12.  Then back on tikosyn.  Now is better.  Notes some pndrip.     Dyspnea is better.  PT gets tight in the chest. When take xopenex it relieves the symptoms    Review of Systems Constitutional:   No  weight loss, night sweats,  Fevers, chills, fatigue, lassitude. HEENT:   No headaches,  Difficulty swallowing,  Tooth/dental problems,  Sore throat,                No sneezing, itching, ear ache, nasal congestion, post nasal drip,   CV:  No chest pain,  Orthopnea, PND, swelling in lower extremities, anasarca, dizziness, palpitations  GI  No heartburn, indigestion, abdominal pain, nausea, vomiting, diarrhea, change in bowel habits, loss of appetite  Resp: No shortness of breath with exertion or at rest.  No excess mucus, no productive cough,  No non-productive cough,  No coughing up of blood.  No change in color of mucus.  No wheezing.  No chest wall deformity  Skin: no rash or lesions.  GU: no dysuria, change in color of urine, no urgency or frequency.  No flank pain.  MS:  No joint pain or swelling.  No decreased range of motion.  No back pain.  Psych:  No change in mood or affect. No depression or anxiety.  No memory loss.     Objective:   Physical Exam   Filed Vitals:   06/26/11 0902  BP: 140/90  Pulse: 63  Temp: 97.4 F (36.3 C)  TempSrc: Oral  Height: 5\' 7"  (1.702 m)  Weight: 208 lb 9.6 oz (94.62 kg)  SpO2: 100%    Gen: Pleasant, well-nourished, in no distress,  normal affect  ENT: No lesions,  mouth clear,  oropharynx clear, no postnasal drip  Neck: No JVD, no TMG, no  carotid bruits  Lungs: No use of accessory muscles, no dullness to percussion, few exp wheezes  Cardiovascular: RRR, heart sounds normal, no murmur or gallops, no peripheral edema  Abdomen: soft and NT, no HSM,  BS normal  Musculoskeletal: No deformities, no cyanosis or clubbing  Neuro: alert, non focal  Skin: Warm, no lesions or rashes  No results found.      Assessment & Plan:   asthma Moderate persistent asthma with discontinuance of and steroid inhaler over the past year by primary care at this time the patient however using her Xopenex inhaler 3-4 times daily Plan Resume inhaled steroid Qvar 2 puff twice daily Use Xopenex as needed only    Updated Medication List Outpatient Encounter Prescriptions as of 06/26/2011  Medication Sig Dispense Refill  . B-D ULTRAFINE III SHORT PEN 31G X 8 MM MISC 1 APPLICATION BY DOES NOT APPLY ROUTE 2 (TWO) TIMES DAILY. USE AS DIRECTED  110 each  10  . buPROPion (WELLBUTRIN XL) 150 MG 24 hr tablet Take 1 tablet (150 mg total) by mouth daily.  30 tablet  11  . digoxin (LANOXIN) 0.125 MG tablet TAKE 1/2 TABLET BY MOUTH  DAILY.  15 tablet  3  . esomeprazole (NEXIUM) 40 MG capsule       . fluticasone (FLONASE) 50 MCG/ACT nasal spray Spray 1 spray into both nostrils twice a day.       Marland Kitchen glipiZIDE (GLUCOTROL) 5 MG tablet Take 1 tablet (5 mg total) by mouth 2 (two) times daily before a meal.  60 tablet  6  . insulin glargine (LANTUS) 100 UNIT/ML injection Inject 15 Units into the skin at bedtime.      . levalbuterol (XOPENEX HFA) 45 MCG/ACT inhaler Inhale 2 puffs into the lungs every 6 (six) hours as needed for wheezing.  1 Inhaler  3  . levalbuterol (XOPENEX) 0.63 MG/3ML nebulizer solution one in nebulizer four times daily as needed per Dr Delford Field       . loratadine (CLARITIN) 10 MG tablet Take 10 mg by mouth daily.      Marland Kitchen losartan (COZAAR) 50 MG tablet TAKE 1 TABLET BY MOUTH EVERY DAY TO STOP ATACAND  30 tablet  11  . metoprolol (LOPRESSOR) 50 MG  tablet Take 1 tablet (50 mg total) by mouth 2 (two) times daily.  60 tablet  11  . polyethylene glycol (MIRALAX / GLYCOLAX) packet TAKE 17 G BY MOUTH DAILY.  14 packet  12  . pravastatin (PRAVACHOL) 20 MG tablet Take 20 mg by mouth daily.        Marland Kitchen spironolactone (ALDACTONE) 25 MG tablet Take 0.5 tablets (12.5 mg total) by mouth daily.  15 tablet  6  . torsemide (DEMADEX) 10 MG tablet Take 2 tablets (20 mg total) by mouth daily.  60 tablet  11  . warfarin (COUMADIN) 5 MG tablet Take as directed       . DISCONTD: insulin glargine (LANTUS SOLOSTAR) 100 UNIT/ML injection Inject 16 Units into the skin at bedtime.  10 mL  12  . DISCONTD: NEXIUM 40 MG capsule TAKE 1 CAPSULE TWICE A DAY  60 capsule  2  . DISCONTD: XOPENEX HFA 45 MCG/ACT inhaler INHALE 2 PUFF USING INHALER EVERY FOUR HOURS PER DR Boyce Keltner  1 Inhaler  3  . beclomethasone (QVAR) 40 MCG/ACT inhaler Inhale 2 puffs into the lungs 2 (two) times daily.  1 Inhaler  12

## 2011-06-26 NOTE — Patient Instructions (Signed)
Start Qvar two puff twice daily Use Xopenex 1-2 puff every 4-6 hours AS NEEDED,  NOT ON A SCHEDULE Return 3 months

## 2011-07-03 DIAGNOSIS — Z7901 Long term (current) use of anticoagulants: Secondary | ICD-10-CM | POA: Diagnosis not present

## 2011-07-03 DIAGNOSIS — I4891 Unspecified atrial fibrillation: Secondary | ICD-10-CM | POA: Diagnosis not present

## 2011-07-22 ENCOUNTER — Telehealth: Payer: Self-pay | Admitting: Critical Care Medicine

## 2011-07-22 MED ORDER — FLUTICASONE PROPIONATE 50 MCG/ACT NA SUSP: NASAL | Status: DC

## 2011-07-22 MED ORDER — LORATADINE 10 MG PO TABS: 10.0000 mg | ORAL_TABLET | Freq: Every day | ORAL | Status: DC

## 2011-07-22 NOTE — Telephone Encounter (Signed)
I spoke with pt and is requesting refill on her loratadine and flonase sent to [facility]. I advised will send rx and nothing further was needed

## 2011-07-24 DIAGNOSIS — Z7901 Long term (current) use of anticoagulants: Secondary | ICD-10-CM | POA: Diagnosis not present

## 2011-07-24 DIAGNOSIS — I4891 Unspecified atrial fibrillation: Secondary | ICD-10-CM | POA: Diagnosis not present

## 2011-07-26 ENCOUNTER — Ambulatory Visit (INDEPENDENT_AMBULATORY_CARE_PROVIDER_SITE_OTHER): Payer: Medicare Other | Admitting: Emergency Medicine

## 2011-07-26 ENCOUNTER — Emergency Department (HOSPITAL_COMMUNITY)
Admission: EM | Admit: 2011-07-26 | Discharge: 2011-07-26 | Disposition: A | Discharge status: Home / Self Care | Payer: Medicare Other | Attending: Emergency Medicine | Admitting: Emergency Medicine

## 2011-07-26 ENCOUNTER — Encounter (HOSPITAL_COMMUNITY): Payer: Self-pay

## 2011-07-26 ENCOUNTER — Emergency Department (HOSPITAL_COMMUNITY): Payer: Medicare Other

## 2011-07-26 VITALS — BP 119/78 | HR 69 | Temp 98.1°F | Resp 18

## 2011-07-26 DIAGNOSIS — R05 Cough: Secondary | ICD-10-CM | POA: Diagnosis not present

## 2011-07-26 DIAGNOSIS — F341 Dysthymic disorder: Secondary | ICD-10-CM | POA: Insufficient documentation

## 2011-07-26 DIAGNOSIS — I4891 Unspecified atrial fibrillation: Secondary | ICD-10-CM | POA: Insufficient documentation

## 2011-07-26 DIAGNOSIS — J984 Other disorders of lung: Secondary | ICD-10-CM

## 2011-07-26 DIAGNOSIS — Z95810 Presence of automatic (implantable) cardiac defibrillator: Secondary | ICD-10-CM | POA: Insufficient documentation

## 2011-07-26 DIAGNOSIS — E785 Hyperlipidemia, unspecified: Secondary | ICD-10-CM | POA: Insufficient documentation

## 2011-07-26 DIAGNOSIS — J45909 Unspecified asthma, uncomplicated: Secondary | ICD-10-CM | POA: Diagnosis not present

## 2011-07-26 DIAGNOSIS — Z79899 Other long term (current) drug therapy: Secondary | ICD-10-CM | POA: Diagnosis not present

## 2011-07-26 DIAGNOSIS — R0602 Shortness of breath: Secondary | ICD-10-CM | POA: Insufficient documentation

## 2011-07-26 DIAGNOSIS — R062 Wheezing: Secondary | ICD-10-CM | POA: Diagnosis not present

## 2011-07-26 DIAGNOSIS — R0603 Acute respiratory distress: Secondary | ICD-10-CM

## 2011-07-26 DIAGNOSIS — I509 Heart failure, unspecified: Secondary | ICD-10-CM | POA: Insufficient documentation

## 2011-07-26 DIAGNOSIS — I517 Cardiomegaly: Secondary | ICD-10-CM | POA: Insufficient documentation

## 2011-07-26 DIAGNOSIS — R079 Chest pain, unspecified: Secondary | ICD-10-CM | POA: Diagnosis not present

## 2011-07-26 DIAGNOSIS — R059 Cough, unspecified: Secondary | ICD-10-CM | POA: Diagnosis not present

## 2011-07-26 LAB — CBC
HCT: 36.3 % (ref 36.0–46.0)
MCH: 30.3 pg (ref 26.0–34.0)
MCV: 90.1 fL (ref 78.0–100.0)
Platelets: 186 10*3/uL (ref 150–400)
RDW: 13.2 % (ref 11.5–15.5)
WBC: 5.3 10*3/uL (ref 4.0–10.5)

## 2011-07-26 LAB — DIFFERENTIAL
Basophils Absolute: 0 10*3/uL (ref 0.0–0.1)
Eosinophils Absolute: 0.2 10*3/uL (ref 0.0–0.7)
Eosinophils Relative: 3 % (ref 0–5)
Lymphocytes Relative: 53 % — ABNORMAL HIGH (ref 12–46)
Monocytes Absolute: 0.5 10*3/uL (ref 0.1–1.0)

## 2011-07-26 LAB — BASIC METABOLIC PANEL
CO2: 25 mEq/L (ref 19–32)
Calcium: 8.8 mg/dL (ref 8.4–10.5)
Creatinine, Ser: 1.07 mg/dL (ref 0.50–1.10)
GFR calc non Af Amer: 55 mL/min — ABNORMAL LOW (ref >90)
Glucose, Bld: 98 mg/dL (ref 70–99)

## 2011-07-26 MED ORDER — PREDNISONE 20 MG PO TABS: 60.0000 mg | ORAL_TABLET | Freq: Every day | ORAL | Status: DC

## 2011-07-26 MED ORDER — METHYLPREDNISOLONE SODIUM SUCC 125 MG IJ SOLR
125.0000 mg | Freq: Once | INTRAMUSCULAR | Status: AC
  Administered 2011-07-26: 125 mg via INTRAVENOUS

## 2011-07-26 MED ORDER — PREDNISONE 20 MG PO TABS
60.0000 mg | ORAL_TABLET | Freq: Once | ORAL | Status: AC
  Administered 2011-07-26: 60 mg via ORAL
  Filled 2011-07-26: qty 3

## 2011-07-26 MED ORDER — SODIUM CHLORIDE 0.9 % IV SOLN
INTRAVENOUS | Status: DC
  Administered 2011-07-26: 11:00:00 via INTRAVENOUS

## 2011-07-26 MED ORDER — LEVALBUTEROL TARTRATE 45 MCG/ACT IN AERO: 1.0000 | INHALATION_SPRAY | RESPIRATORY_TRACT | Status: DC | PRN

## 2011-07-26 NOTE — ED Notes (Signed)
Gave ECG to Dr. Nino Parsley after I performed. 11:24 am JG.
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
## 2011-07-26 NOTE — ED Notes (Signed)
Pt denies intubation w/last exposure to charcoal
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
## 2011-07-26 NOTE — Discharge Instructions (Signed)
Your x-ray does not show pneumonia.  Use prednisone and Xopenex for coughing.  Followup with your Dr. as needed.  Return for worse or uncontrolled symptoms
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
## 2011-07-27 ENCOUNTER — Encounter: Payer: Self-pay | Admitting: Critical Care Medicine

## 2011-07-27 ENCOUNTER — Ambulatory Visit (INDEPENDENT_AMBULATORY_CARE_PROVIDER_SITE_OTHER): Payer: Medicare Other | Admitting: Critical Care Medicine

## 2011-07-27 VITALS — BP 112/80 | HR 71 | Temp 98.4°F | Ht 67.0 in | Wt 205.0 lb

## 2011-07-27 DIAGNOSIS — J45909 Unspecified asthma, uncomplicated: Secondary | ICD-10-CM | POA: Diagnosis not present

## 2011-07-27 MED ORDER — PREDNISONE 10 MG PO TABS: ORAL_TABLET | ORAL | Status: DC

## 2011-07-27 MED ORDER — LEVALBUTEROL HCL 0.63 MG/3ML IN NEBU: 0.6300 mg | INHALATION_SOLUTION | Freq: Four times a day (QID) | RESPIRATORY_TRACT | Status: DC | PRN

## 2011-07-27 MED ORDER — CEFUROXIME AXETIL 250 MG PO TABS: 250.0000 mg | ORAL_TABLET | Freq: Two times a day (BID) | ORAL | Status: AC

## 2011-07-27 MED ORDER — BECLOMETHASONE DIPROPIONATE 40 MCG/ACT IN AERS: 3.0000 | INHALATION_SPRAY | Freq: Two times a day (BID) | RESPIRATORY_TRACT | Status: DC
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
## 2011-07-27 NOTE — Patient Instructions (Signed)
Use ceftin one twice daily for 7days Prednisone 10mg    60mg  per day for 2 days then reduce by 10mg  every 4 days until off, more prednisone sent to [facility] Use xopenex in nebulizer 3-4 times per day for 3 days then as needed Increase Qvar to 3 puff twice daily Return 1 week for recheck with Tammy Parrett
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
## 2011-08-01 ENCOUNTER — Other Ambulatory Visit: Payer: Self-pay | Admitting: Family Medicine

## 2011-08-04 ENCOUNTER — Encounter: Payer: Self-pay | Admitting: Adult Health

## 2011-08-04 ENCOUNTER — Ambulatory Visit (INDEPENDENT_AMBULATORY_CARE_PROVIDER_SITE_OTHER): Payer: Medicare Other | Admitting: Adult Health

## 2011-08-04 VITALS — BP 106/62 | HR 78 | Temp 97.3°F | Ht 67.0 in | Wt 205.4 lb

## 2011-08-04 DIAGNOSIS — J45909 Unspecified asthma, uncomplicated: Secondary | ICD-10-CM
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
## 2011-08-04 NOTE — Patient Instructions (Signed)
Continue on current regimen .  follow up Dr. Wright  In 3 months and As needed   

## 2011-08-04 NOTE — Assessment & Plan Note (Signed)
Recent flare with URI /Bronchits -now resolved  Cont w/ current regimen.

## 2011-08-04 NOTE — Telephone Encounter (Signed)
Please send RX request to Dr. Clarene Duke- pt cardiologist who manages this med.

## 2011-08-07 DIAGNOSIS — Z7901 Long term (current) use of anticoagulants: Secondary | ICD-10-CM | POA: Diagnosis not present

## 2011-08-07 DIAGNOSIS — I4891 Unspecified atrial fibrillation: Secondary | ICD-10-CM | POA: Diagnosis not present

## 2011-08-23 ENCOUNTER — Other Ambulatory Visit: Payer: Self-pay | Admitting: Critical Care Medicine

## 2011-08-24 DIAGNOSIS — I4891 Unspecified atrial fibrillation: Secondary | ICD-10-CM | POA: Diagnosis not present

## 2011-08-24 DIAGNOSIS — Z7901 Long term (current) use of anticoagulants: Secondary | ICD-10-CM | POA: Diagnosis not present

## 2011-08-25 ENCOUNTER — Telehealth: Payer: Self-pay | Admitting: Critical Care Medicine

## 2011-08-25 MED ORDER — ESOMEPRAZOLE MAGNESIUM 40 MG PO CPDR: 40.0000 mg | DELAYED_RELEASE_CAPSULE | Freq: Every day | ORAL | Status: DC

## 2011-08-25 NOTE — Telephone Encounter (Signed)
I spoke with pt and is needling refill on nexium. rx has been sent and nothing further was needed

## 2011-08-25 NOTE — Telephone Encounter (Signed)
Please advise if this is ok to refill. Thanks. 

## 2011-09-05 ENCOUNTER — Other Ambulatory Visit: Payer: Self-pay | Admitting: Family Medicine

## 2011-09-11 DIAGNOSIS — E11319 Type 2 diabetes mellitus with unspecified diabetic retinopathy without macular edema: Secondary | ICD-10-CM | POA: Diagnosis not present

## 2011-09-11 DIAGNOSIS — E119 Type 2 diabetes mellitus without complications: Secondary | ICD-10-CM | POA: Diagnosis not present

## 2011-09-17 ENCOUNTER — Encounter: Payer: Self-pay | Admitting: Family Medicine

## 2011-09-17 ENCOUNTER — Ambulatory Visit: Payer: Medicare Other | Admitting: Family Medicine

## 2011-09-17 VITALS — BP 115/80 | HR 64 | Ht 67.0 in | Wt 204.7 lb

## 2011-09-17 DIAGNOSIS — E7800 Pure hypercholesterolemia, unspecified: Secondary | ICD-10-CM

## 2011-09-17 DIAGNOSIS — E1140 Type 2 diabetes mellitus with diabetic neuropathy, unspecified: Secondary | ICD-10-CM

## 2011-09-17 DIAGNOSIS — I10 Essential (primary) hypertension: Secondary | ICD-10-CM

## 2011-09-17 DIAGNOSIS — E119 Type 2 diabetes mellitus without complications: Secondary | ICD-10-CM

## 2011-09-17 DIAGNOSIS — J45909 Unspecified asthma, uncomplicated: Secondary | ICD-10-CM

## 2011-09-17 DIAGNOSIS — G4730 Sleep apnea, unspecified: Secondary | ICD-10-CM

## 2011-09-17 LAB — POCT GLYCOSYLATED HEMOGLOBIN (HGB A1C): Hemoglobin A1C: 8.5

## 2011-09-17 MED ORDER — POLYETHYLENE GLYCOL 3350 17 G PO PACK: 17.0000 g | PACK | Freq: Every day | ORAL | Status: DC

## 2011-09-17 NOTE — Progress Notes (Signed)
  Subjective:    Patient ID: [name], female    DOB: Aug 29, 1950, 61 y.o.   MRN: [id number]  HPI Diabetes: Patient states that she is checking blood sugar 2 times a day. 100-120 fasting. 130s postprandial. Did have to take steroid for her asthma exacerbation in April-this did cause elevated blood sugars at that time. A1c today 8.5 up from previous A1c of 7.3.  Patient states she is exercising daily-walking one hour.  Trying to eat healthy diet.  Doing daily foot checks.  Taking losartan daily. Eye appointment up-to-date.-Done May 2013 Medication-Glucotrol 5 mg tablets twice a day-and Lantus 15 units at bedtime. No signs or symptoms of hypoglycemia.  Asthma: Patient had asthma exacerbation in April. Was placed on steroid inhaler. Also treated with Xopenex and steroid Dosepak. Patient now back to normal baseline. States she is taking Qvar to as directed. No wheezing currently. No fever. No shortness of breath.  Sleep apnea: Patient states she still was not using CPAP machine. Does have daytime drowsiness. Takes a nap each day. States that she is not comfortable. She knows she should use it-but has a hard time actually using it.  Cholesterol: LdL Elevated to 93 in October. Patient has been working out. Has tried eat healthy diet. Patient agrees to recheck of LDL today. Patient is aware goal is 70 for LDL.  Smoking status reviewed.    Review of Systems as per above   Objective:   Physical Exam  Constitutional: She appears well-developed and well-nourished.  HENT:  Head: Normocephalic and atraumatic.  Eyes: Pupils are equal, round, and reactive to light.  Neck: Normal range of motion. No thyromegaly present.  Cardiovascular: Normal rate, regular rhythm and normal heart sounds.   No murmur heard. Pulmonary/Chest: Effort normal. No respiratory distress. She has no wheezes. She has no rales. She exhibits no tenderness.  Abdominal: Soft. She exhibits no distension.  Musculoskeletal:  She exhibits no edema.  Neurological: She is alert.  Skin: No rash noted.  Psychiatric: She has a normal mood and affect.          Assessment & Plan:

## 2011-09-17 NOTE — Patient Instructions (Addendum)
Asthma: Continue qvar as directed.   Cholesterol: Will check your level today.  And will send a letter or call with results.  Aspirin: I think that you should take this daily.  Remember to talk to cardiologist at your June appointment.   Diabetes: Your a1c is up to 8.5 today- most likely elevated from the prednisone.  Continue to walk.  Also work on healthy diet-- see handout.   Return in 3 months for yearly physical and recheck of A1C

## 2011-09-18 ENCOUNTER — Encounter: Payer: Self-pay | Admitting: Family Medicine

## 2011-09-18 DIAGNOSIS — E1140 Type 2 diabetes mellitus with diabetic neuropathy, unspecified: Secondary | ICD-10-CM

## 2011-09-18 HISTORY — DX: Type 2 diabetes mellitus with diabetic neuropathy, unspecified: E11.40

## 2011-09-18 NOTE — Assessment & Plan Note (Signed)
Now wnl on exam.  Pt to continue qvar as directed by pulmonologist.

## 2011-09-18 NOTE — Assessment & Plan Note (Signed)
Well controlled currently- will recheck K and creatinine today.

## 2011-09-18 NOTE — Assessment & Plan Note (Signed)
A1C increased to 8.5- most likely 2/2 prednisone dose for asthma flare last month that caused elevations.  Pt taking lantus and glucotrol as directed.  Exercising daily- walking.  Trying to eat healthy diet.  Pt is not taking aspirin currently- i am not aware of a contraindication- and this is indicated due to pt's diagnosis of diabetes as well as other risk factors.  Pt states she wants to discuss with cardiology at her visit later this month. Pt given handout on healthy plate method- if she increases green leafy vegetables pt knos that she should call cardiology to make f/up coumadin levels more frequent while she is increasing vegetable intake.

## 2011-09-18 NOTE — Assessment & Plan Note (Signed)
Sensation diminished significantly on foot exam- see flowsheet. Need to follow closely.  No pain in feet due to neuropathy at this time.

## 2011-09-18 NOTE — Assessment & Plan Note (Signed)
Encouraged pt to use CPAP machine to help improve daytime sleepiness as well as overall wellness- pulmonary and cardiac.

## 2011-09-18 NOTE — Assessment & Plan Note (Signed)
LDL rechecked today-- will adjust Pravachol if indicated.

## 2011-09-30 ENCOUNTER — Other Ambulatory Visit: Payer: Self-pay | Admitting: Family Medicine

## 2011-10-01 DIAGNOSIS — Z7901 Long term (current) use of anticoagulants: Secondary | ICD-10-CM | POA: Diagnosis not present

## 2011-10-01 DIAGNOSIS — I4891 Unspecified atrial fibrillation: Secondary | ICD-10-CM | POA: Diagnosis not present

## 2011-10-01 DIAGNOSIS — I10 Essential (primary) hypertension: Secondary | ICD-10-CM | POA: Diagnosis not present

## 2011-10-03 ENCOUNTER — Other Ambulatory Visit: Payer: Self-pay | Admitting: Family Medicine

## 2011-10-14 ENCOUNTER — Ambulatory Visit (INDEPENDENT_AMBULATORY_CARE_PROVIDER_SITE_OTHER): Payer: Medicare Other | Admitting: Critical Care Medicine

## 2011-10-14 ENCOUNTER — Encounter: Payer: Self-pay | Admitting: Critical Care Medicine

## 2011-10-14 VITALS — BP 116/80 | HR 73 | Temp 99.0°F | Ht 67.0 in | Wt 206.6 lb

## 2011-10-14 DIAGNOSIS — J45909 Unspecified asthma, uncomplicated: Secondary | ICD-10-CM

## 2011-10-14 NOTE — Patient Instructions (Addendum)
No change in medications. Return in   3 months 

## 2011-10-14 NOTE — Progress Notes (Signed)
Subjective:    Patient ID: [name], female    DOB: November 17, 1950, 61 y.o.   MRN: [id number]  HPI    10/14/2011 Doing well since last ov.  No asthma exac recently.  No GERD symptoms .  No f/c/s Pt denies any significant sore throat, nasal congestion or excess secretions, fever, chills, sweats, unintended weight loss, pleurtic or exertional chest pain, orthopnea PND, or leg swelling Pt denies any increase in rescue therapy over baseline, denies waking up needing it or having any early am or nocturnal exacerbations of coughing/wheezing/or dyspnea. Pt also denies any obvious fluctuation in symptoms with  weather or environmental change or other alleviating or aggravating factors    Past Medical History  Diagnosis Date  . Atrial fibrillation   . Hyperlipidemia   . Depression   . Anxiety   . Diabetes mellitus   . Anemia   . Arthritis   . GERD (gastroesophageal reflux disease)   . Sleep apnea   . Vocal cord disease   . CHF (congestive heart failure)     Echo 08/08/10 by [facility]. EF 35-45%. LV systolic function moderately reduced. Moderate global hypokinesis of LV.  RV systolic function moderately reduced. Mild MR. Trace AR.  Marland Kitchen History of ventricular tachycardia     History of VT-VT arrest, now has AICD  . Cardiac arrest jan 2012    in hospital for pneumonia when this occured- occured at the hospital  . AICD (automatic cardioverter/defibrillator) present 06/23/2011  . GASTROESOPHAGEAL REFLUX, NO ESOPHAGITIS 06/10/2006    Qualifier: Diagnosis of  By: Abundio Miu       Family History  Problem Relation Age of Onset  . Diabetes Father   . Hypertension Father      History   Social History  . Marital Status: Widowed    Spouse Name: N/A    Number of Children: N/A  . Years of Education: N/A   Occupational History  . Not on file.   Social History Main Topics  . Smoking status: Never Smoker   . Smokeless tobacco: Never Used  . Alcohol Use: No  . Drug Use: No    . Sexually Active: No   Other Topics Concern  . Not on file   Social History Narrative  . No narrative on file     Allergies  Allergen Reactions  . Avelox (Moxifloxacin Hcl In Nacl)     Cardiac arrest per pt  . Ace Inhibitors Cough    Dry cough      Outpatient Prescriptions Prior to Visit  Medication Sig Dispense Refill  . B-D ULTRAFINE III SHORT PEN 31G X 8 MM MISC 1 APPLICATION BY DOES NOT APPLY ROUTE 2 (TWO) TIMES DAILY. USE AS DIRECTED  110 each  10  . beclomethasone (QVAR) 40 MCG/ACT inhaler Inhale 3 puffs into the lungs 2 (two) times daily.  1 Inhaler  12  . buPROPion (WELLBUTRIN XL) 150 MG 24 hr tablet Take 1 tablet (150 mg total) by mouth daily.  30 tablet  11  . digoxin (LANOXIN) 0.125 MG tablet TAKE 1/2 TABLET BY MOUTH DAILY.  15 tablet  3  . dofetilide (TIKOSYN) 250 MCG capsule Take 250 mcg by mouth 2 (two) times daily.      Marland Kitchen esomeprazole (NEXIUM) 40 MG capsule Take 1 capsule (40 mg total) by mouth daily.  30 capsule  3  . fluticasone (FLONASE) 50 MCG/ACT nasal spray Spray 1 spray into both nostrils twice a day.  16  g  5  . glipiZIDE (GLUCOTROL) 5 MG tablet Take 1 tablet (5 mg total) by mouth 2 (two) times daily before a meal.  60 tablet  6  . LANTUS SOLOSTAR 100 UNIT/ML injection inject 16 units subcutaneously at bedtime  15 mL  6  . levalbuterol (XOPENEX HFA) 45 MCG/ACT inhaler Inhale 1-2 puffs into the lungs every 4 (four) hours as needed for wheezing.  1 Inhaler  0  . levalbuterol (XOPENEX) 0.63 MG/3ML nebulizer solution Take 3 mLs (0.63 mg total) by nebulization every 6 (six) hours as needed for wheezing. one in nebulizer four times daily as needed per Dr Delford Field  75 mL  4  . loratadine (CLARITIN) 10 MG tablet Take 1 tablet (10 mg total) by mouth daily.  30 tablet  5  . losartan (COZAAR) 50 MG tablet TAKE 1 TABLET BY MOUTH EVERY DAY TO STOP ATACAND  30 tablet  11  . metoprolol (LOPRESSOR) 50 MG tablet Take 1 tablet (50 mg total) by mouth 2 (two) times daily.  60  tablet  11  . polyethylene glycol (MIRALAX / GLYCOLAX) packet Take 17 g by mouth daily.  24 each  5  . pravastatin (PRAVACHOL) 20 MG tablet Take 20 mg by mouth daily.        Marland Kitchen spironolactone (ALDACTONE) 25 MG tablet take 1/2 tablet by mouth once daily  15 tablet  6  . torsemide (DEMADEX) 10 MG tablet Take 2 tablets (20 mg total) by mouth daily.  60 tablet  11  . warfarin (COUMADIN) 5 MG tablet Take as directed           Review of Systems  Constitutional:   No  weight loss, night sweats,  Fevers, chills, fatigue, lassitude. HEENT:   No headaches,  Difficulty swallowing,  Tooth/dental problems,  Sore throat,                No sneezing, itching, ear ache, nasal congestion, No  post nasal drip,   CV:  No chest pain,  Orthopnea, PND, swelling in lower extremities, anasarca, dizziness, palpitations  GI  No heartburn, indigestion, abdominal pain, nausea, vomiting, diarrhea, change in bowel habits, loss of appetite  Resp:   No coughing up of blood. Marland Kitchen  No chest wall deformity  Skin: no rash or lesions.  GU: no dysuria, change in color of urine, no urgency or frequency.  No flank pain.  MS:  No joint pain or swelling.  No decreased range of motion.  No back pain.  Psych:  No change in mood or affect. No depression or anxiety.  No memory loss.     Objective:   Physical Exam    Filed Vitals:   10/14/11 1425  BP: 116/80  Pulse: 73  Temp: 99 F (37.2 C)  TempSrc: Oral  Height: 5\' 7"  (1.702 m)  Weight: 93.713 kg (206 lb 9.6 oz)  SpO2: 98%    Gen: Pleasant, well-nourished, in no distress,  normal affect  ENT: No lesions,  mouth clear,  oropharynx clear, no postnasal drip  Neck: No JVD, no TMG, no carotid bruits  Lungs: No use of accessory muscles, no dullness to percussion CTA bilaterally  Cardiovascular: RRR, heart sounds normal, no murmur or gallops, no peripheral edema  Abdomen: soft and NT, no HSM,  BS normal  Musculoskeletal: No deformities, no cyanosis or  clubbing  Neuro: alert, non focal  Skin: Warm, no lesions or rashes  No results found.      Assessment &  Plan:   asthma Stable moderate persistent asthma  Plan No change in inhaled or maintenance medications.      Updated Medication List Outpatient Encounter Prescriptions as of 10/14/2011  Medication Sig Dispense Refill  . B-D ULTRAFINE III SHORT PEN 31G X 8 MM MISC 1 APPLICATION BY DOES NOT APPLY ROUTE 2 (TWO) TIMES DAILY. USE AS DIRECTED  110 each  10  . beclomethasone (QVAR) 40 MCG/ACT inhaler Inhale 3 puffs into the lungs 2 (two) times daily.  1 Inhaler  12  . buPROPion (WELLBUTRIN XL) 150 MG 24 hr tablet Take 1 tablet (150 mg total) by mouth daily.  30 tablet  11  . digoxin (LANOXIN) 0.125 MG tablet TAKE 1/2 TABLET BY MOUTH DAILY.  15 tablet  3  . dofetilide (TIKOSYN) 250 MCG capsule Take 250 mcg by mouth 2 (two) times daily.      Marland Kitchen esomeprazole (NEXIUM) 40 MG capsule Take 1 capsule (40 mg total) by mouth daily.  30 capsule  3  . fluticasone (FLONASE) 50 MCG/ACT nasal spray Spray 1 spray into both nostrils twice a day.  16 g  5  . glipiZIDE (GLUCOTROL) 5 MG tablet Take 1 tablet (5 mg total) by mouth 2 (two) times daily before a meal.  60 tablet  6  . LANTUS SOLOSTAR 100 UNIT/ML injection inject 16 units subcutaneously at bedtime  15 mL  6  . levalbuterol (XOPENEX HFA) 45 MCG/ACT inhaler Inhale 1-2 puffs into the lungs every 4 (four) hours as needed for wheezing.  1 Inhaler  0  . levalbuterol (XOPENEX) 0.63 MG/3ML nebulizer solution Take 3 mLs (0.63 mg total) by nebulization every 6 (six) hours as needed for wheezing. one in nebulizer four times daily as needed per Dr Delford Field  75 mL  4  . loratadine (CLARITIN) 10 MG tablet Take 1 tablet (10 mg total) by mouth daily.  30 tablet  5  . losartan (COZAAR) 50 MG tablet TAKE 1 TABLET BY MOUTH EVERY DAY TO STOP ATACAND  30 tablet  11  . metoprolol (LOPRESSOR) 50 MG tablet Take 1 tablet (50 mg total) by mouth 2 (two) times daily.  60  tablet  11  . polyethylene glycol (MIRALAX / GLYCOLAX) packet Take 17 g by mouth daily.  24 each  5  . pravastatin (PRAVACHOL) 20 MG tablet Take 20 mg by mouth daily.        Marland Kitchen spironolactone (ALDACTONE) 25 MG tablet take 1/2 tablet by mouth once daily  15 tablet  6  . torsemide (DEMADEX) 10 MG tablet Take 2 tablets (20 mg total) by mouth daily.  60 tablet  11  . warfarin (COUMADIN) 5 MG tablet Take as directed

## 2011-10-15 NOTE — Assessment & Plan Note (Signed)
Stable moderate persistent asthma  Plan No change in inhaled or maintenance medications.

## 2011-10-16 DIAGNOSIS — I4891 Unspecified atrial fibrillation: Secondary | ICD-10-CM | POA: Diagnosis not present

## 2011-10-16 DIAGNOSIS — Z7901 Long term (current) use of anticoagulants: Secondary | ICD-10-CM | POA: Diagnosis not present

## 2011-10-21 DIAGNOSIS — I498 Other specified cardiac arrhythmias: Secondary | ICD-10-CM | POA: Diagnosis not present

## 2011-10-21 DIAGNOSIS — I499 Cardiac arrhythmia, unspecified: Secondary | ICD-10-CM | POA: Diagnosis not present

## 2011-10-21 DIAGNOSIS — Z950 Presence of cardiac pacemaker: Secondary | ICD-10-CM | POA: Diagnosis not present

## 2011-10-26 ENCOUNTER — Other Ambulatory Visit: Payer: Self-pay | Admitting: Family Medicine

## 2011-10-30 ENCOUNTER — Other Ambulatory Visit (HOSPITAL_COMMUNITY)
Admission: RE | Admit: 2011-10-30 | Discharge: 2011-10-30 | Disposition: A | Discharge status: Home / Self Care | Payer: Medicare Other | Source: Ambulatory Visit | Attending: Family Medicine | Admitting: Family Medicine

## 2011-10-30 ENCOUNTER — Ambulatory Visit (INDEPENDENT_AMBULATORY_CARE_PROVIDER_SITE_OTHER): Payer: Medicare Other | Admitting: Family Medicine

## 2011-10-30 ENCOUNTER — Encounter: Payer: Self-pay | Admitting: Family Medicine

## 2011-10-30 VITALS — BP 130/80 | HR 64 | Ht 67.0 in | Wt 205.0 lb

## 2011-10-30 DIAGNOSIS — Z01419 Encounter for gynecological examination (general) (routine) without abnormal findings: Secondary | ICD-10-CM | POA: Insufficient documentation

## 2011-10-30 DIAGNOSIS — I10 Essential (primary) hypertension: Secondary | ICD-10-CM | POA: Diagnosis not present

## 2011-10-30 DIAGNOSIS — M7061 Trochanteric bursitis, right hip: Secondary | ICD-10-CM

## 2011-10-30 DIAGNOSIS — E119 Type 2 diabetes mellitus without complications: Secondary | ICD-10-CM | POA: Diagnosis not present

## 2011-10-30 DIAGNOSIS — Z124 Encounter for screening for malignant neoplasm of cervix: Secondary | ICD-10-CM | POA: Diagnosis not present

## 2011-10-30 DIAGNOSIS — E7800 Pure hypercholesterolemia, unspecified: Secondary | ICD-10-CM | POA: Diagnosis not present

## 2011-10-30 DIAGNOSIS — M76899 Other specified enthesopathies of unspecified lower limb, excluding foot: Secondary | ICD-10-CM | POA: Diagnosis not present

## 2011-10-30 DIAGNOSIS — Z0000 Encounter for general adult medical examination without abnormal findings: Secondary | ICD-10-CM | POA: Diagnosis not present

## 2011-10-30 NOTE — Patient Instructions (Addendum)
Great to see you today!  Keep up the great work with your diet and exercise. -- If you would like more information regarding diet and to help you work towards your goal weight you can meet with the nutritionist here at the clinic- Dr. Gerilyn Pilgrim- just call for an appointment.   Follow up with cardiology regarding cholesterol management plan- Glad that you are working on this actively.  I will mail you your results of the pap smear- I will call if there is anything we need to discuss in detail. - please call if any questions.   Return for next complete physical in 1 year- return for diabetes recheck in  3-4 months.   I have enjoyed caring for you and getting to know you! Dr. Cristal Ford will be your new doctor.

## 2011-11-02 DIAGNOSIS — M7061 Trochanteric bursitis, right hip: Secondary | ICD-10-CM | POA: Insufficient documentation

## 2011-11-02 DIAGNOSIS — Z0000 Encounter for general adult medical examination without abnormal findings: Secondary | ICD-10-CM | POA: Insufficient documentation

## 2011-11-02 NOTE — Progress Notes (Signed)
  Subjective:    Patient ID: [name], female    DOB: 01/22/1951, 61 y.o.   MRN: [id number]  HPI PMH, Surgical history, Family History, allergies, medications and social history all updated under appropriate tabs.    Hyperlipidemia: Taken off of lipid medication by cardiologist- concern regarding body aches.  This is being followed by cardiologist- has f/up appt where they are going to discuss restart.   DM: Per pt well controlled.  BG in am- 120-130, pp 140's.  Dental and eye exam uptodate- 6 months ago.  BMeT obatined at previous visit.    Prevention: - last pap in 2006- no h/o abnormals- agrees to pap today.  - up to date on mammogram.   - Due for tetanus.  - Doesn't want shingles  Vaccine- would like handout so that she can read more about it.    Right Hip pain:  Right hip pain, x 1week.  Worse with walking up stairs, worse at night.  Has never had this in the past.  Point tenderness over lateral hip area.  No groin or buttocs pain.  Normal sensation. Normal strength.         Review of Systems     Objective:   Physical Exam  Constitutional: She appears well-developed and well-nourished.  HENT:  Head: Normocephalic and atraumatic.  Eyes: Pupils are equal, round, and reactive to light. Right eye exhibits no discharge. Left eye exhibits no discharge.  Neck: Normal range of motion.  Cardiovascular: Normal rate, regular rhythm and normal heart sounds.   No murmur heard. Pulmonary/Chest: Effort normal and breath sounds normal. No respiratory distress. She has no wheezes. She has no rales.  Abdominal: Soft. She exhibits no distension. There is no tenderness.  Genitourinary: Vagina normal. No breast swelling (no nodules or lumps bilateral), tenderness, discharge or bleeding. There is no rash, tenderness or lesion on the right labia. There is no rash, tenderness or lesion on the left labia. Cervix exhibits no motion tenderness, no discharge and no friability. Right adnexum  displays no mass, no tenderness and no fullness. Left adnexum displays no mass, no tenderness and no fullness.  Musculoskeletal: She exhibits no edema.       Right hip pain: + point tenderness over great trochanter of right hip.  Normal strength.  Normal rom in hip.   Normal reflexes.  Normal sensation.      Neurological: She is alert.  Skin: No rash noted.  Psychiatric: She has a normal mood and affect.   A steroid injection was performed at right trochanteric bursa using 6cc 1% plain Lidocaine and 2cc of steroid.  This was well tolerated.        Assessment & Plan:

## 2011-11-02 NOTE — Assessment & Plan Note (Signed)
Offered conservative treatment with antiinflammatories and watchful waiting- pt states that she would like to have injection today due to pain.  Agreed to do injection - see note above- pt tolerated well.  Pt to return if new or worsening of symptoms.

## 2011-11-02 NOTE — Assessment & Plan Note (Signed)
Pt BG wnl per home log book.  A1c elevated at last check most likely 2/2 steroid taper that was given during that time.  Pt due for recheck of A1C in9/2013.  Pt to return for recheck in 2-3 months.  No changes in regimen at the current time.

## 2011-11-02 NOTE — Assessment & Plan Note (Signed)
Prevention:  - last pap in 2006- no h/o abnormals- agrees to pap today.  - up to date on mammogram. - breast exam wnl.  - Due for tetanus. - will give today - [place] given about shingles vaccine.

## 2011-11-02 NOTE — Assessment & Plan Note (Signed)
Well-controlled currently. Continue current regimen. 

## 2011-11-02 NOTE — Assessment & Plan Note (Signed)
Pt to f/up with cardiology regarding plan for lipid management- and restart of medications.

## 2011-11-03 ENCOUNTER — Encounter: Payer: Self-pay | Admitting: Family Medicine

## 2011-11-13 DIAGNOSIS — Z7901 Long term (current) use of anticoagulants: Secondary | ICD-10-CM | POA: Diagnosis not present

## 2011-11-13 DIAGNOSIS — I4891 Unspecified atrial fibrillation: Secondary | ICD-10-CM | POA: Diagnosis not present

## 2011-12-03 ENCOUNTER — Ambulatory Visit (INDEPENDENT_AMBULATORY_CARE_PROVIDER_SITE_OTHER): Payer: Medicare Other | Admitting: Family Medicine

## 2011-12-03 ENCOUNTER — Encounter: Payer: Self-pay | Admitting: Family Medicine

## 2011-12-03 VITALS — BP 124/83 | HR 73 | Temp 98.5°F | Ht 66.0 in | Wt 206.4 lb

## 2011-12-03 DIAGNOSIS — J45909 Unspecified asthma, uncomplicated: Secondary | ICD-10-CM | POA: Diagnosis not present

## 2011-12-03 MED ORDER — GUAIFENESIN-CODEINE 100-10 MG/5ML PO SYRP: 5.0000 mL | ORAL_SOLUTION | Freq: Three times a day (TID) | ORAL | Status: AC | PRN

## 2011-12-03 MED ORDER — METHYLPREDNISOLONE SODIUM SUCC 40 MG IJ SOLR
80.0000 mg | Freq: Once | INTRAMUSCULAR | Status: AC
  Administered 2011-12-03: 80 mg via INTRAMUSCULAR

## 2011-12-03 MED ORDER — PREDNISONE 50 MG PO TABS: ORAL_TABLET | ORAL | Status: DC

## 2011-12-03 NOTE — Patient Instructions (Addendum)
You are experiencing an asthma exacerbation We gave you steroids in clinic.  Please pick up your cough medicine and steroids at the pharmacy Start taking your steriod pills tomorrow morning with breakfast.  If your symptoms get worse or continue after completing your steroid treatment come back to clinic  Have a great day

## 2011-12-03 NOTE — Progress Notes (Signed)
  Subjective:    Patient ID: [name], female    DOB: 08-Nov-1950, 61 y.o.   MRN: [id number]  HPI  CC: cough   Cough: 3 day h/o worsening nonproductive cough and nighttime awakenings. Compliant w/ QVAR BID. Using Xopenex nebs Q4hrs for last 2 days w/ improvement. H/o multiple exacerbations and recent h/o PNA and cardiac arrest w/ ICU stay. Denies any CP, palpitations, SOB, N/V/D/C, lightheadedness, syncope. Denies sick contacts  Review of Systems Per HPI    Objective:   Physical Exam  Gen: Mild distress, WNWD CV: RRR, no m/r/g Res: Good air movement, unlabored breathing. Audible wheezing w/o stethoscope.        Assessment & Plan:

## 2011-12-03 NOTE — Assessment & Plan Note (Addendum)
Current exacerbation. Treat w/ Solumedrol IM in office (only 80mg  on hand). Prednisone 50mg  Qday for 5 more days. Robitussin w/ codeine. No breathing treatment in clinic as no Xopenex and pt feels well enough to go home. Reasons for return (ED visit) discussed. Instructions printed for pt.

## 2011-12-04 ENCOUNTER — Telehealth: Payer: Self-pay | Admitting: Critical Care Medicine

## 2011-12-04 DIAGNOSIS — J45909 Unspecified asthma, uncomplicated: Secondary | ICD-10-CM

## 2011-12-04 NOTE — Telephone Encounter (Signed)
She can only use xopenex therefore she cannot use [facility]

## 2011-12-04 NOTE — Telephone Encounter (Signed)
Spoke with [name] at [facility]. She states that the pt is wanting to obtain a new neb machine from them, but in order for them to send her a machine, we have to send med with it, and they do not have the xopenex which is what is on her med list. She can not take albuterol, and so they are asking for rx for atrovent. Please advise if this is ok thanks

## 2011-12-05 ENCOUNTER — Other Ambulatory Visit: Payer: Self-pay | Admitting: Critical Care Medicine

## 2011-12-07 MED ORDER — LEVALBUTEROL HCL 0.63 MG/3ML IN NEBU: INHALATION_SOLUTION | RESPIRATORY_TRACT | Status: DC

## 2011-12-07 NOTE — Telephone Encounter (Signed)
I spoke with the pt and advised that [facility] does not carry the med that she needs so we will need to go through another company. Pt is ok with this. Order has been placed and rx printed and placed in [facility] look-at for xopenex.Carron Curie, CMA

## 2011-12-07 NOTE — Telephone Encounter (Signed)
I spoke with [name] and she states that there are not any homecare companies that supply xopenex at  This time so pt needs to get through pharmacy. I spoke with the pt and she states she will continue to get med through [facility] on [address].Sharise Lippy Yancey Flemings, CMA

## 2011-12-08 ENCOUNTER — Telehealth: Payer: Self-pay | Admitting: Critical Care Medicine

## 2011-12-08 NOTE — Telephone Encounter (Signed)
[name], have you seen CMN? Please advise, thanks!

## 2011-12-09 ENCOUNTER — Encounter: Payer: Self-pay | Admitting: Adult Health

## 2011-12-09 ENCOUNTER — Ambulatory Visit (INDEPENDENT_AMBULATORY_CARE_PROVIDER_SITE_OTHER): Payer: Medicare Other | Admitting: Adult Health

## 2011-12-09 ENCOUNTER — Telehealth: Payer: Self-pay | Admitting: Critical Care Medicine

## 2011-12-09 ENCOUNTER — Ambulatory Visit (INDEPENDENT_AMBULATORY_CARE_PROVIDER_SITE_OTHER)
Admission: RE | Admit: 2011-12-09 | Discharge: 2011-12-09 | Disposition: A | Discharge status: Home / Self Care | Payer: Medicare Other | Source: Ambulatory Visit | Attending: Adult Health | Admitting: Adult Health

## 2011-12-09 VITALS — BP 128/78 | HR 79 | Temp 98.0°F | Ht 66.0 in | Wt 203.6 lb

## 2011-12-09 DIAGNOSIS — J45909 Unspecified asthma, uncomplicated: Secondary | ICD-10-CM

## 2011-12-09 DIAGNOSIS — R0602 Shortness of breath: Secondary | ICD-10-CM | POA: Diagnosis not present

## 2011-12-09 MED ORDER — CEFUROXIME AXETIL 250 MG PO TABS: 250.0000 mg | ORAL_TABLET | Freq: Two times a day (BID) | ORAL | Status: AC

## 2011-12-09 MED ORDER — PREDNISONE 10 MG PO TABS: ORAL_TABLET | ORAL | Status: DC

## 2011-12-09 MED ORDER — HYDROCODONE-HOMATROPINE 5-1.5 MG/5ML PO SYRP: 5.0000 mL | ORAL_SOLUTION | Freq: Four times a day (QID) | ORAL | Status: AC | PRN

## 2011-12-09 NOTE — Telephone Encounter (Signed)
Spoke with patient-she is coming to see TP today at 930 am.

## 2011-12-09 NOTE — Patient Instructions (Addendum)
Ceftin Twice daily  For 7 days  Prednisone taper over next week.  Delsym 2 tsp Twice daily  As needed  Cough  Chlortrimeton 4mg  1 in am and 2 At bedtime  As needed  Throat clearing /drainage  Hydromet 1-2 tsp every 4-6 hr As needed  Cough , may make you sleepy.  Please contact office for sooner follow up if symptoms do not improve or worsen or seek emergency care  follow up Dr. Delford Field  In 4-6 weeks and As needed   Notify coumadin clinic you are on an antibiotic

## 2011-12-09 NOTE — Telephone Encounter (Signed)
Spoke with [facility] in ref to pt med;and supplies,informed them this was given to another DME co per 12/07/11 referral and 12/04/11 ov note.Kandice Hams
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
## 2011-12-10 DIAGNOSIS — J4540 Moderate persistent asthma, uncomplicated: Secondary | ICD-10-CM

## 2011-12-10 HISTORY — DX: Moderate persistent asthma, uncomplicated: J45.40

## 2011-12-10 NOTE — Assessment & Plan Note (Signed)
Exacerbation complicated by underlying VCD   Plan;  Ceftin Twice daily  For 7 days  Prednisone taper over next week.  Delsym 2 tsp Twice daily  As needed  Cough  Chlortrimeton 4mg  1 in am and 2 At bedtime  As needed  Throat clearing /drainage  Hydromet 1-2 tsp every 4-6 hr As needed  Cough , may make you sleepy.  Please contact office for sooner follow up if symptoms do not improve or worsen or seek emergency care  follow up Dr. Delford Field  In 4-6 weeks and As needed   Notify coumadin clinic you are on an antibiotic

## 2011-12-11 DIAGNOSIS — I4891 Unspecified atrial fibrillation: Secondary | ICD-10-CM | POA: Diagnosis not present

## 2011-12-11 DIAGNOSIS — Z7901 Long term (current) use of anticoagulants: Secondary | ICD-10-CM | POA: Diagnosis not present

## 2011-12-18 ENCOUNTER — Telehealth: Payer: Self-pay | Admitting: Critical Care Medicine

## 2011-12-18 MED ORDER — PREDNISONE 10 MG PO TABS: ORAL_TABLET | ORAL | Status: DC

## 2011-12-18 MED ORDER — AZITHROMYCIN 250 MG PO TABS: ORAL_TABLET | ORAL | Status: DC

## 2011-12-18 NOTE — Telephone Encounter (Signed)
Double nexium for 10days Strict reflux diet zpak x 1 pred recycle  10mg  Take 4 for two days three for two days two for two days one for two days  #20

## 2011-12-18 NOTE — Telephone Encounter (Signed)
Called and spoke with pt and she is aware of PW recs for the zpak, increase nexium bid x 10 days, take the pred dosepak and strict reflux diet.  Pt voiced her understanding of these recs and is aware that these 2 meds have been sent to her pharamcy.  Nothing further is needed.

## 2011-12-18 NOTE — Telephone Encounter (Signed)
Called and spoke with pt and she stated that she finished the ceftin and the prednisone taper that was given on 8/28 when pt saw TP.  Pt is still using the delsym and they hydromet but she stated that she is not any better.  Still c/o coughing with yellow sputum.  Pt stated that she is not any worse but thought that this would have cleared up.  Pt is requesting recs today.  PW please advise. Thanks  Allergies  Allergen Reactions  . Avelox (Moxifloxacin Hcl In Nacl)     Cardiac arrest per pt  . Ace Inhibitors Cough    Dry cough

## 2012-01-02 ENCOUNTER — Other Ambulatory Visit: Payer: Self-pay | Admitting: Critical Care Medicine

## 2012-01-06 ENCOUNTER — Ambulatory Visit (INDEPENDENT_AMBULATORY_CARE_PROVIDER_SITE_OTHER): Payer: Medicare Other | Admitting: Critical Care Medicine

## 2012-01-06 ENCOUNTER — Encounter: Payer: Self-pay | Admitting: Critical Care Medicine

## 2012-01-06 VITALS — BP 114/78 | HR 73 | Temp 98.2°F | Ht 67.0 in | Wt 204.0 lb

## 2012-01-06 DIAGNOSIS — J45909 Unspecified asthma, uncomplicated: Secondary | ICD-10-CM

## 2012-01-06 MED ORDER — PREDNISONE 10 MG PO TABS: ORAL_TABLET | ORAL | Status: DC

## 2012-01-06 MED ORDER — HYDROCODONE-HOMATROPINE 5-1.5 MG/5ML PO SYRP
5.0000 mL | ORAL_SOLUTION | Freq: Four times a day (QID) | ORAL | Status: DC | PRN
Start: 2012-01-06 — End: 2012-03-24

## 2012-01-06 MED ORDER — CEFUROXIME AXETIL 500 MG PO TABS: 500.0000 mg | ORAL_TABLET | Freq: Two times a day (BID) | ORAL | Status: DC

## 2012-01-06 NOTE — Patient Instructions (Addendum)
ceftin one twice daily for 10days Prednisone 10mg  Take 4 for three days 3 for three days 2 for three days 1 for three days and stop Stay on Qvar Use Hycodan as needed for cough Return 2 weeks for recheck

## 2012-01-06 NOTE — Progress Notes (Signed)
Subjective:    Patient ID: [name], female    DOB: [birth year], 61 y.o.   MRN: [id number]  HPI 61 y.o.  AAF with known hx of Asthma , VCD, CHF and Atrial fib on chronic coumadin   08/04/2011 Follow up  Patient returns for a followup. Seen last week for an asthmatic exacerbation. She had an x-ray that showed no acute process. She was treated with antibiotics, and a steroid taper. Patient returns today feeling much better with decreased cough, wheezing, and congestion. Continues to have some mild clear nasal drainage. She denies any chest pain, phthisis, fever, or leg swelling. >>No changes   8/28Acute OV  Complains of wheezing, increased SOB, tightness in chest, prod cough with yellowish mucus x1 week Saw PCP at onset, was given oral steroid and depo medrol injection. Did not get any better.  Now coughing up thick yellow/green mucus. Wheezing is worse.  No fever or edema.  CXR with no acute process today   01/06/2012 Pt was better after acute ov , now worse.   Cough is productive green mucus and yellow.  Notes wheeze.   Rx per NP was Ceftin Twice daily For 7 days  Prednisone taper over next week.  Delsym 2 tsp Twice daily As needed Cough  Chlortrimeton 4mg  1 in am and 2 At bedtime As needed Throat clearing /drainage  Hydromet 1-2 tsp every 4-6 hr As needed Cough , may make you sleepy.   CXR was neg. Pt notes some pndrip.  Pt still dyspneic and chest is tight.  Notes sl chest pain if coughs. No real fever.   Past Medical History  Diagnosis Date  . Atrial fibrillation   . Hyperlipidemia   . Depression   . Anxiety   . Diabetes mellitus   . Anemia   . Arthritis   . GERD (gastroesophageal reflux disease)   . Sleep apnea   . Vocal cord disease   . CHF (congestive heart failure)     Echo 08/08/10 by [facility]. EF 35-45%. LV systolic function moderately reduced. Moderate global hypokinesis of LV.  RV systolic function moderately reduced. Mild MR. Trace AR.  Marland Kitchen History of  ventricular tachycardia     History of VT-VT arrest, now has AICD  . Cardiac arrest jan 2012    in hospital for pneumonia when this occured- occured at the hospital  . AICD (automatic cardioverter/defibrillator) present 06/23/2011  . GASTROESOPHAGEAL REFLUX, NO ESOPHAGITIS 06/10/2006    Qualifier: Diagnosis of  By: Abundio Miu    . Asthma     has had multiple hospitalizations for this     Family History  Problem Relation Age of Onset  . Diabetes Father   . Hypertension Father      History   Social History  . Marital Status: Widowed    Spouse Name: N/A    Number of Children: N/A  . Years of Education: N/A   Occupational History  . Not on file.   Social History Main Topics  . Smoking status: Never Smoker   . Smokeless tobacco: Never Used  . Alcohol Use: No  . Drug Use: No  . Sexually Active: No   Other Topics Concern  . Not on file   Social History Narrative   Not employedExercise- walking 1 hour daily. Diet- eating healthy diet.      Allergies  Allergen Reactions  . Avelox (Moxifloxacin Hcl In Nacl)     Cardiac arrest per pt  . Ace  Inhibitors Cough    Dry cough      Outpatient Prescriptions Prior to Visit  Medication Sig Dispense Refill  . B-D ULTRAFINE III SHORT PEN 31G X 8 MM MISC 1 APPLICATION BY DOES NOT APPLY ROUTE 2 (TWO) TIMES DAILY. USE AS DIRECTED  110 each  10  . beclomethasone (QVAR) 40 MCG/ACT inhaler Inhale 3 puffs into the lungs 2 (two) times daily.  1 Inhaler  12  . buPROPion (WELLBUTRIN XL) 150 MG 24 hr tablet Take 1 tablet (150 mg total) by mouth daily.  30 tablet  11  . digoxin (LANOXIN) 0.125 MG tablet TAKE 1/2 TABLET BY MOUTH DAILY.  15 tablet  3  . dofetilide (TIKOSYN) 250 MCG capsule Take 250 mcg by mouth 2 (two) times daily.      . fluticasone (FLONASE) 50 MCG/ACT nasal spray Spray 1 spray into both nostrils twice a day.  16 g  5  . glipiZIDE (GLUCOTROL) 5 MG tablet take 1 tablet by mouth twice a day before meals  60 tablet  3  .  insulin glargine (LANTUS SOLOSTAR) 100 UNIT/ML injection Inject 15 Units into the skin at bedtime.       . levalbuterol (XOPENEX HFA) 45 MCG/ACT inhaler Inhale 1-2 puffs into the lungs every 4 (four) hours as needed for wheezing.  1 Inhaler  0  . levalbuterol (XOPENEX) 0.63 MG/3ML nebulizer solution one in nebulizer four times daily as needed per Dr Delford Field  360 mL  4  . loratadine (CLARITIN) 10 MG tablet take 1 tablet by mouth once daily  30 tablet  5  . losartan (COZAAR) 50 MG tablet TAKE 1 TABLET BY MOUTH EVERY DAY TO STOP ATACAND  30 tablet  11  . metoprolol (LOPRESSOR) 50 MG tablet Take 1 tablet (50 mg total) by mouth 2 (two) times daily.  60 tablet  11  . NEXIUM 40 MG capsule take 1 capsule by mouth once daily  30 capsule  3  . polyethylene glycol (MIRALAX / GLYCOLAX) packet Take 17 g by mouth daily.  24 each  5  . spironolactone (ALDACTONE) 25 MG tablet take 1/2 tablet by mouth once daily  15 tablet  6  . torsemide (DEMADEX) 10 MG tablet Take 2 tablets (20 mg total) by mouth daily.  60 tablet  11  . warfarin (COUMADIN) 5 MG tablet Take as directed       . azithromycin (ZITHROMAX) 250 MG tablet Take as directed  6 tablet  0  . predniSONE (DELTASONE) 10 MG tablet 4 tabs for 2 days, then 3 tabs for 2 days, 2 tabs for 2 days, then 1 tab for 2 days, then stop  20 tablet  0      Review of Systems  Constitutional:   No  weight loss, night sweats,  Fevers, chills, fatigue, lassitude. HEENT:   No headaches,  Difficulty swallowing,  Tooth/dental problems,  Sore throat,                No sneezing, itching, ear ache, nasal congestion, + post nasal drip,   CV:  No chest pain,  Orthopnea, PND, swelling in lower extremities, anasarca, dizziness, palpitations  GI  No heartburn, indigestion, abdominal pain, nausea, vomiting, diarrhea, change in bowel habits, loss of appetite  Resp:   No coughing up of blood. Marland Kitchen  No chest wall deformity  Skin: no rash or lesions.  GU: no dysuria, change in color  of urine, no urgency or frequency.  No flank pain.  MS:  No joint pain or swelling.  No decreased range of motion.  No back pain.  Psych:  No change in mood or affect. No depression or anxiety.  No memory loss.     Objective:   Physical Exam    Filed Vitals:   01/06/12 1459  BP: 114/78  Pulse: 73  Temp: 98.2 F (36.8 C)  TempSrc: Oral  Height: 5\' 7"  (1.702 m)  Weight: 204 lb (92.534 kg)  SpO2: 97%    Gen: Pleasant, well-nourished, in no distress,  normal affect  ENT: No lesions,  mouth clear,  oropharynx clear, no postnasal drip  Neck: No JVD, no TMG, no carotid bruits  Lungs: No use of accessory muscles, no dullness to percussion Coarse BS w/ exp wheezing   Cardiovascular: RRR, heart sounds normal, no murmur or gallops, no peripheral edema  Abdomen: soft and NT, no HSM,  BS normal  Musculoskeletal: No deformities, no cyanosis or clubbing  Neuro: alert, non focal  Skin: Warm, no lesions or rashes  No results found.      Assessment & Plan:   Asthmatic bronchitis Cyclical cough on the basis of asthmatic bronchitis with recurrent flare and associated sinusitis Plan Repeat Ceftin 500 mg twice daily for 10 days Administer prednisone 10 mg in a pulse and taper Continued inhaled medications as prescribed Hold off on flu vaccine is a patient returns for recheck in 2 weeks    Updated Medication List Outpatient Encounter Prescriptions as of 01/06/2012  Medication Sig Dispense Refill  . B-D ULTRAFINE III SHORT PEN 31G X 8 MM MISC 1 APPLICATION BY DOES NOT APPLY ROUTE 2 (TWO) TIMES DAILY. USE AS DIRECTED  110 each  10  . beclomethasone (QVAR) 40 MCG/ACT inhaler Inhale 3 puffs into the lungs 2 (two) times daily.  1 Inhaler  12  . buPROPion (WELLBUTRIN XL) 150 MG 24 hr tablet Take 1 tablet (150 mg total) by mouth daily.  30 tablet  11  . digoxin (LANOXIN) 0.125 MG tablet TAKE 1/2 TABLET BY MOUTH DAILY.  15 tablet  3  . dofetilide (TIKOSYN) 250 MCG capsule Take 250  mcg by mouth 2 (two) times daily.      . fluticasone (FLONASE) 50 MCG/ACT nasal spray Spray 1 spray into both nostrils twice a day.  16 g  5  . glipiZIDE (GLUCOTROL) 5 MG tablet take 1 tablet by mouth twice a day before meals  60 tablet  3  . insulin glargine (LANTUS SOLOSTAR) 100 UNIT/ML injection Inject 15 Units into the skin at bedtime.       . levalbuterol (XOPENEX HFA) 45 MCG/ACT inhaler Inhale 1-2 puffs into the lungs every 4 (four) hours as needed for wheezing.  1 Inhaler  0  . levalbuterol (XOPENEX) 0.63 MG/3ML nebulizer solution one in nebulizer four times daily as needed per Dr Delford Field  360 mL  4  . loratadine (CLARITIN) 10 MG tablet take 1 tablet by mouth once daily  30 tablet  5  . losartan (COZAAR) 50 MG tablet TAKE 1 TABLET BY MOUTH EVERY DAY TO STOP ATACAND  30 tablet  11  . metoprolol (LOPRESSOR) 50 MG tablet Take 1 tablet (50 mg total) by mouth 2 (two) times daily.  60 tablet  11  . NEXIUM 40 MG capsule take 1 capsule by mouth once daily  30 capsule  3  . polyethylene glycol (MIRALAX / GLYCOLAX) packet Take 17 g by mouth daily.  24 each  5  . spironolactone (ALDACTONE)  25 MG tablet take 1/2 tablet by mouth once daily  15 tablet  6  . torsemide (DEMADEX) 10 MG tablet Take 2 tablets (20 mg total) by mouth daily.  60 tablet  11  . warfarin (COUMADIN) 5 MG tablet Take as directed       . cefUROXime (CEFTIN) 500 MG tablet Take 1 tablet (500 mg total) by mouth 2 (two) times daily.  20 tablet  0  . HYDROcodone-homatropine (HYCODAN) 5-1.5 MG/5ML syrup Take 5 mLs by mouth every 6 (six) hours as needed for cough.  120 mL  0  . predniSONE (DELTASONE) 10 MG tablet Take 4 for three days 3 for three days 2 for three days 1 for three days and stop  30 tablet  0  . DISCONTD: azithromycin (ZITHROMAX) 250 MG tablet Take as directed  6 tablet  0  . DISCONTD: predniSONE (DELTASONE) 10 MG tablet 4 tabs for 2 days, then 3 tabs for 2 days, 2 tabs for 2 days, then 1 tab for 2 days, then stop  20 tablet  0

## 2012-01-06 NOTE — Assessment & Plan Note (Signed)
Cyclical cough on the basis of asthmatic bronchitis with recurrent flare and associated sinusitis Plan Repeat Ceftin 500 mg twice daily for 10 days Administer prednisone 10 mg in a pulse and taper Continued inhaled medications as prescribed Hold off on flu vaccine is a patient returns for recheck in 2 weeks

## 2012-01-07 DIAGNOSIS — I4891 Unspecified atrial fibrillation: Secondary | ICD-10-CM | POA: Diagnosis not present

## 2012-01-07 DIAGNOSIS — Z7901 Long term (current) use of anticoagulants: Secondary | ICD-10-CM | POA: Diagnosis not present

## 2012-01-18 ENCOUNTER — Encounter: Payer: Self-pay | Admitting: Critical Care Medicine

## 2012-01-18 ENCOUNTER — Ambulatory Visit (INDEPENDENT_AMBULATORY_CARE_PROVIDER_SITE_OTHER): Payer: Medicare Other | Admitting: Critical Care Medicine

## 2012-01-18 VITALS — BP 122/80 | HR 89 | Temp 98.1°F | Ht 66.0 in | Wt 201.4 lb

## 2012-01-18 DIAGNOSIS — J45909 Unspecified asthma, uncomplicated: Secondary | ICD-10-CM | POA: Diagnosis not present

## 2012-01-18 DIAGNOSIS — Z23 Encounter for immunization: Secondary | ICD-10-CM | POA: Diagnosis not present

## 2012-01-18 NOTE — Patient Instructions (Signed)
Flu vaccine will be given No more antibiotics needed No change in medications. Return in        4 months A health care power of attorney form was issued to you to sign with a notary

## 2012-01-18 NOTE — Assessment & Plan Note (Signed)
Asthmatic bronchitis flare with associated sinusitis now improved Plan Continued inhaled medications No additional antibiotics or prednisone indicated Flu vaccine

## 2012-01-18 NOTE — Progress Notes (Signed)
Subjective:    Patient ID: [name], female    DOB: [birth year], 61 y.o.   MRN: [id number]  HPI 61 y.o.  AAF with known hx of Asthma , VCD, CHF and Atrial fib on chronic coumadin   08/04/2011 Follow up  Patient returns for a followup. Seen last week for an asthmatic exacerbation. She had an x-ray that showed no acute process. She was treated with antibiotics, and a steroid taper. Patient returns today feeling much better with decreased cough, wheezing, and congestion. Continues to have some mild clear nasal drainage. She denies any chest pain, phthisis, fever, or leg swelling. >>No changes   8/28Acute OV  Complains of wheezing, increased SOB, tightness in chest, prod cough with yellowish mucus x1 week Saw PCP at onset, was given oral steroid and depo medrol injection. Did not get any better.  Now coughing up thick yellow/green mucus. Wheezing is worse.  No fever or edema.  CXR with no acute process today   01/06/2012 Pt was better after acute ov , now worse.   Cough is productive green mucus and yellow.  Notes wheeze.   Rx per NP was Ceftin Twice daily For 7 days  Prednisone taper over next week.  Delsym 2 tsp Twice daily As needed Cough  Chlortrimeton 4mg  1 in am and 2 At bedtime As needed Throat clearing /drainage  Hydromet 1-2 tsp every 4-6 hr As needed Cough , may make you sleepy.   CXR was neg. Pt notes some pndrip.  Pt still dyspneic and chest is tight.  Notes sl chest pain if coughs. No real fever.    01/18/2012 Pt seen 9/25 and still sick with sinusitis and we rx: Repeat Ceftin 500 mg twice daily for 10 days Administer prednisone 10 mg in a pulse and taper Continued inhaled medications as prescribed Hold off on flu vaccine is a patient returns for recheck in 2 weeks  No cough at all.  Dyspnea is better.  No chest pains. Weight is down Pt denies any significant sore throat, nasal congestion or excess secretions, fever, chills, sweats, unintended weight loss, pleurtic or  exertional chest pain, orthopnea PND, or leg swelling Pt denies any increase in rescue therapy over baseline, denies waking up needing it or having any early am or nocturnal exacerbations of coughing/wheezing/or dyspnea. Pt also denies any obvious fluctuation in symptoms with  weather or environmental change or other alleviating or aggravating factors    Past Medical History  Diagnosis Date  . Atrial fibrillation   . Hyperlipidemia   . Depression   . Anxiety   . Diabetes mellitus   . Anemia   . Arthritis   . GERD (gastroesophageal reflux disease)   . Sleep apnea   . Vocal cord disease   . CHF (congestive heart failure)     Echo 08/08/10 by [facility]. EF 35-45%. LV systolic function moderately reduced. Moderate global hypokinesis of LV.  RV systolic function moderately reduced. Mild MR. Trace AR.  Marland Kitchen History of ventricular tachycardia     History of VT-VT arrest, now has AICD  . Cardiac arrest jan 2012    in hospital for pneumonia when this occured- occured at the hospital  . AICD (automatic cardioverter/defibrillator) present 06/23/2011  . GASTROESOPHAGEAL REFLUX, NO ESOPHAGITIS 06/10/2006    Qualifier: Diagnosis of  By: Abundio Miu    . Asthma     has had multiple hospitalizations for this     Family History  Problem Relation Age of  Onset  . Diabetes Father   . Hypertension Father      History   Social History  . Marital Status: Widowed    Spouse Name: N/A    Number of Children: N/A  . Years of Education: N/A   Occupational History  . Not on file.   Social History Main Topics  . Smoking status: Never Smoker   . Smokeless tobacco: Never Used  . Alcohol Use: No  . Drug Use: No  . Sexually Active: No   Other Topics Concern  . Not on file   Social History Narrative   Not employedExercise- walking 1 hour daily. Diet- eating healthy diet.      Allergies  Allergen Reactions  . Avelox (Moxifloxacin Hcl In Nacl)     Cardiac arrest per pt  . Ace  Inhibitors Cough    Dry cough      Outpatient Prescriptions Prior to Visit  Medication Sig Dispense Refill  . B-D ULTRAFINE III SHORT PEN 31G X 8 MM MISC 1 APPLICATION BY DOES NOT APPLY ROUTE 2 (TWO) TIMES DAILY. USE AS DIRECTED  110 each  10  . beclomethasone (QVAR) 40 MCG/ACT inhaler Inhale 3 puffs into the lungs 2 (two) times daily.  1 Inhaler  12  . buPROPion (WELLBUTRIN XL) 150 MG 24 hr tablet Take 1 tablet (150 mg total) by mouth daily.  30 tablet  11  . digoxin (LANOXIN) 0.125 MG tablet TAKE 1/2 TABLET BY MOUTH DAILY.  15 tablet  3  . dofetilide (TIKOSYN) 250 MCG capsule Take 250 mcg by mouth 2 (two) times daily.      . fluticasone (FLONASE) 50 MCG/ACT nasal spray Spray 1 spray into both nostrils twice a day.  16 g  5  . glipiZIDE (GLUCOTROL) 5 MG tablet take 1 tablet by mouth twice a day before meals  60 tablet  3  . HYDROcodone-homatropine (HYCODAN) 5-1.5 MG/5ML syrup Take 5 mLs by mouth every 6 (six) hours as needed for cough.  120 mL  0  . insulin glargine (LANTUS SOLOSTAR) 100 UNIT/ML injection Inject 15 Units into the skin at bedtime.       . levalbuterol (XOPENEX HFA) 45 MCG/ACT inhaler Inhale 1-2 puffs into the lungs every 4 (four) hours as needed for wheezing.  1 Inhaler  0  . levalbuterol (XOPENEX) 0.63 MG/3ML nebulizer solution one in nebulizer four times daily as needed per Dr Delford Field  360 mL  4  . loratadine (CLARITIN) 10 MG tablet take 1 tablet by mouth once daily  30 tablet  5  . losartan (COZAAR) 50 MG tablet TAKE 1 TABLET BY MOUTH EVERY DAY TO STOP ATACAND  30 tablet  11  . metoprolol (LOPRESSOR) 50 MG tablet Take 1 tablet (50 mg total) by mouth 2 (two) times daily.  60 tablet  11  . NEXIUM 40 MG capsule take 1 capsule by mouth once daily  30 capsule  3  . polyethylene glycol (MIRALAX / GLYCOLAX) packet Take 17 g by mouth daily.  24 each  5  . spironolactone (ALDACTONE) 25 MG tablet take 1/2 tablet by mouth once daily  15 tablet  6  . torsemide (DEMADEX) 10 MG tablet  Take 2 tablets (20 mg total) by mouth daily.  60 tablet  11  . warfarin (COUMADIN) 5 MG tablet Take as directed       . cefUROXime (CEFTIN) 500 MG tablet Take 1 tablet (500 mg total) by mouth 2 (two) times daily.  20  tablet  0  . predniSONE (DELTASONE) 10 MG tablet Take 4 for three days 3 for three days 2 for three days 1 for three days and stop  30 tablet  0      Review of Systems  Constitutional:   No  weight loss, night sweats,  Fevers, chills, fatigue, lassitude. HEENT:   No headaches,  Difficulty swallowing,  Tooth/dental problems,  Sore throat,                No sneezing, itching, ear ache, nasal congestion, + post nasal drip,   CV:  No chest pain,  Orthopnea, PND, swelling in lower extremities, anasarca, dizziness, palpitations  GI  No heartburn, indigestion, abdominal pain, nausea, vomiting, diarrhea, change in bowel habits, loss of appetite  Resp:   No coughing up of blood. Marland Kitchen  No chest wall deformity  Skin: no rash or lesions.  GU: no dysuria, change in color of urine, no urgency or frequency.  No flank pain.  MS:  No joint pain or swelling.  No decreased range of motion.  No back pain.  Psych:  No change in mood or affect. No depression or anxiety.  No memory loss.     Objective:   Physical Exam    Filed Vitals:   01/18/12 1040  BP: 122/80  Pulse: 89  Temp: 98.1 F (36.7 C)  TempSrc: Oral  Height: 5\' 6"  (1.676 m)  Weight: 201 lb 6.4 oz (91.354 kg)  SpO2: 98%    Gen: Pleasant, well-nourished, in no distress,  normal affect  ENT: No lesions,  mouth clear,  oropharynx clear, no postnasal drip  Neck: No JVD, no TMG, no carotid bruits  Lungs: No use of accessory muscles, no dullness to percussion, clear  Cardiovascular: RRR, heart sounds normal, no murmur or gallops, no peripheral edema  Abdomen: soft and NT, no HSM,  BS normal  Musculoskeletal: No deformities, no cyanosis or clubbing  Neuro: alert, non focal  Skin: Warm, no lesions or rashes  No  results found.      Assessment & Plan:   Asthmatic bronchitis Asthmatic bronchitis flare with associated sinusitis now improved Plan Continued inhaled medications No additional antibiotics or prednisone indicated Flu vaccine    Updated Medication List Outpatient Encounter Prescriptions as of 01/18/2012  Medication Sig Dispense Refill  . B-D ULTRAFINE III SHORT PEN 31G X 8 MM MISC 1 APPLICATION BY DOES NOT APPLY ROUTE 2 (TWO) TIMES DAILY. USE AS DIRECTED  110 each  10  . beclomethasone (QVAR) 40 MCG/ACT inhaler Inhale 3 puffs into the lungs 2 (two) times daily.  1 Inhaler  12  . buPROPion (WELLBUTRIN XL) 150 MG 24 hr tablet Take 1 tablet (150 mg total) by mouth daily.  30 tablet  11  . digoxin (LANOXIN) 0.125 MG tablet TAKE 1/2 TABLET BY MOUTH DAILY.  15 tablet  3  . dofetilide (TIKOSYN) 250 MCG capsule Take 250 mcg by mouth 2 (two) times daily.      . fluticasone (FLONASE) 50 MCG/ACT nasal spray Spray 1 spray into both nostrils twice a day.  16 g  5  . glipiZIDE (GLUCOTROL) 5 MG tablet take 1 tablet by mouth twice a day before meals  60 tablet  3  . HYDROcodone-homatropine (HYCODAN) 5-1.5 MG/5ML syrup Take 5 mLs by mouth every 6 (six) hours as needed for cough.  120 mL  0  . insulin glargine (LANTUS SOLOSTAR) 100 UNIT/ML injection Inject 15 Units into the skin at bedtime.       Marland Kitchen  levalbuterol (XOPENEX HFA) 45 MCG/ACT inhaler Inhale 1-2 puffs into the lungs every 4 (four) hours as needed for wheezing.  1 Inhaler  0  . levalbuterol (XOPENEX) 0.63 MG/3ML nebulizer solution one in nebulizer four times daily as needed per Dr Delford Field  360 mL  4  . loratadine (CLARITIN) 10 MG tablet take 1 tablet by mouth once daily  30 tablet  5  . losartan (COZAAR) 50 MG tablet TAKE 1 TABLET BY MOUTH EVERY DAY TO STOP ATACAND  30 tablet  11  . metoprolol (LOPRESSOR) 50 MG tablet Take 1 tablet (50 mg total) by mouth 2 (two) times daily.  60 tablet  11  . NEXIUM 40 MG capsule take 1 capsule by mouth once daily   30 capsule  3  . polyethylene glycol (MIRALAX / GLYCOLAX) packet Take 17 g by mouth daily.  24 each  5  . spironolactone (ALDACTONE) 25 MG tablet take 1/2 tablet by mouth once daily  15 tablet  6  . torsemide (DEMADEX) 10 MG tablet Take 2 tablets (20 mg total) by mouth daily.  60 tablet  11  . warfarin (COUMADIN) 5 MG tablet Take as directed       . DISCONTD: cefUROXime (CEFTIN) 500 MG tablet Take 1 tablet (500 mg total) by mouth 2 (two) times daily.  20 tablet  0  . DISCONTD: predniSONE (DELTASONE) 10 MG tablet Take 4 for three days 3 for three days 2 for three days 1 for three days and stop  30 tablet  0

## 2012-01-19 ENCOUNTER — Encounter: Payer: Self-pay | Admitting: Family Medicine

## 2012-01-19 ENCOUNTER — Telehealth: Payer: Self-pay | Admitting: Family Medicine

## 2012-01-19 ENCOUNTER — Ambulatory Visit (INDEPENDENT_AMBULATORY_CARE_PROVIDER_SITE_OTHER): Payer: Medicare Other | Admitting: Family Medicine

## 2012-01-19 VITALS — BP 136/78 | HR 88 | Temp 98.1°F | Ht 67.0 in | Wt 201.7 lb

## 2012-01-19 DIAGNOSIS — I10 Essential (primary) hypertension: Secondary | ICD-10-CM | POA: Diagnosis not present

## 2012-01-19 DIAGNOSIS — E119 Type 2 diabetes mellitus without complications: Secondary | ICD-10-CM | POA: Diagnosis not present

## 2012-01-19 DIAGNOSIS — J45909 Unspecified asthma, uncomplicated: Secondary | ICD-10-CM

## 2012-01-19 DIAGNOSIS — E1140 Type 2 diabetes mellitus with diabetic neuropathy, unspecified: Secondary | ICD-10-CM

## 2012-01-19 DIAGNOSIS — E1149 Type 2 diabetes mellitus with other diabetic neurological complication: Secondary | ICD-10-CM | POA: Diagnosis not present

## 2012-01-19 DIAGNOSIS — E1142 Type 2 diabetes mellitus with diabetic polyneuropathy: Secondary | ICD-10-CM

## 2012-01-19 LAB — CBC WITH DIFFERENTIAL/PLATELET
Basophils Absolute: 0 10*3/uL (ref 0.0–0.1)
Basophils Relative: 0 % (ref 0–1)
Hemoglobin: 12.5 g/dL (ref 12.0–15.0)
MCHC: 31.8 g/dL (ref 30.0–36.0)
Neutro Abs: 4 10*3/uL (ref 1.7–7.7)
Neutrophils Relative %: 57 % (ref 43–77)
Platelets: 196 10*3/uL (ref 150–400)
RDW: 13.4 % (ref 11.5–15.5)

## 2012-01-19 LAB — COMPREHENSIVE METABOLIC PANEL
ALT: 30 U/L (ref 0–35)
AST: 16 U/L (ref 0–37)
Albumin: 3.8 g/dL (ref 3.5–5.2)
BUN: 19 mg/dL (ref 6–23)
Calcium: 8.9 mg/dL (ref 8.4–10.5)
Chloride: 95 mEq/L — ABNORMAL LOW (ref 96–112)
Potassium: 4 mEq/L (ref 3.5–5.3)

## 2012-01-19 LAB — TSH: TSH: 1.288 u[IU]/mL (ref 0.350–4.500)

## 2012-01-19 NOTE — Telephone Encounter (Signed)
[employer] form to be completed by [employer]. Form given to [facility]. Please fax once completed.

## 2012-01-19 NOTE — Telephone Encounter (Signed)
Form given to pcp to complete and faxed with OV notes to [employer] [phone].[name] [place]

## 2012-01-19 NOTE — Assessment & Plan Note (Signed)
Improved and now off steroids. Patient resistant to increase in her insulin today, hopefully glycemia will improve significantly at f/u.

## 2012-01-19 NOTE — Assessment & Plan Note (Signed)
Borderline goal in this diabetic. Just stopped steroids yesterday. Will check renal function and potassium. Followup in 3 months.

## 2012-01-19 NOTE — Assessment & Plan Note (Signed)
He patient with callus buildup. No wounds today, the patient is at risk. Advised yearly podiatrist check and daily self checks. Stressed importance of glycemic control in avoiding further decline.

## 2012-01-19 NOTE — Assessment & Plan Note (Signed)
Control is significantly worse today with A1c of 12. This comes after 7 weeks of prednisone burst with severe asthma. Advised patient to increase her Lantus today, though she refuses to make this change. She would like to wait and see what happens now that she stopped her prednisone yesterday. On Lantus and glipizide, with no recent hypoglycemic episodes. Advised her to followup within the next 3-4 weeks with a CBG log and we will revisit dose of Lantus most likely. She may also benefit from [place], would not start metformin with her borderline renal function.

## 2012-01-19 NOTE — Patient Instructions (Addendum)
NIce to meet you today. Your diabetes is much worse from steroids. I think you could go up on your lantus, but lets try to see what happens off steroids. Keep trying for weight loss. Have your feet checked by podiatrist and eyes checked at least once yearly. Bring in blood sugar log to next visit in one month.   Diabetes and Foot Care Diabetes may cause you to have a poor blood supply (circulation) to your legs and feet. Because of this, the skin may be thinner, break easier, and heal more slowly. You also may have nerve damage in your legs and feet causing decreased feeling. You may not notice minor injuries to your feet that could lead to serious problems or infections. Taking care of your feet is one of the most important things you can do for yourself.  HOME CARE INSTRUCTIONS  Do not go barefoot. Bare feet are easily injured.  Check your feet daily for blisters, cuts, and redness.  Wash your feet with warm water (not hot) and mild soap. Pat your feet and between your toes until completely dry.  Apply a moisturizing lotion that does not contain alcohol or petroleum jelly to the dry skin on your feet and to dry brittle toenails. Do not put it between your toes.  Trim your toenails straight across. Do not dig under them or around the cuticle.  Do not cut corns or calluses, or try to remove them with medicine.  Wear clean cotton socks or stockings every day. Make sure they are not too tight. Do not wear knee high stockings since they may decrease blood flow to your legs.  Wear leather shoes that fit properly and have enough cushioning. To break in new shoes, wear them just a few hours a day to avoid injuring your feet.  Wear shoes at all times, even in the house.  Do not cross your legs. This may decrease the blood flow to your feet.  If you find a minor scrape, cut, or break in the skin on your feet, keep it and the skin around it clean and dry. These areas may be cleansed with mild  soap and water. Do not use peroxide, alcohol, iodine or Merthiolate.  When you remove an adhesive bandage, be sure not to harm the skin around it.  If you have a wound, look at it several times a day to make sure it is healing.  Do not use heating pads or hot water bottles. Burns can occur. If you have lost feeling in your feet or legs, you may not know it is happening until it is too late.  Report any cuts, sores or bruises to your caregiver. Do not wait! SEEK MEDICAL CARE IF:   You have an injury that is not healing or you notice redness, numbness, burning, or tingling.  Your feet always feel cold.  You have pain or cramps in your legs and feet. SEEK IMMEDIATE MEDICAL CARE IF:   There is increasing redness, swelling, or increasing pain in the wound.  There is a red line that goes up your leg.  Pus is coming from a wound.  You develop an unexplained oral temperature above 102 F (38.9 C), or as your caregiver suggests.  You notice a bad smell coming from an ulcer or wound. MAKE SURE YOU:   Understand these instructions.  Will watch your condition.  Will get help right away if you are not doing well or get worse. Document Released: 03/27/2000 Document Revised:  06/22/2011 Document Reviewed: 10/03/2008 [facility] Patient Information 2013 [place], [place].

## 2012-01-19 NOTE — Progress Notes (Signed)
  Subjective:    Patient ID: [name], female    DOB: October 30, 1950, 61 y.o.   MRN: [id number]  HPI  1. DM2. States CBG are slightly elevated since being on steroids for past 7 weeks related to asthma exacerbation. Her fasting ranges are 120-145 in the highest value she notes is in the evening 160. She states before the steroids her fasting values were usually 110s to 120. Her last A1c was 8.5, currently elevated to 12. Patient denies any worsening of polyuria, no polydipsia, no polyphagia. She has lost 3 pounds in the past several months. She does endorse poor sensation in her feet and a callus. She has seen a podiatrist in the past though not regularly. Does have regular ophthalmology followup.  2. cardiac. Patient with history of atrial fibrillation in CHF who had any the fibrillator placed 2 years ago after and arrhythmia and cardiac arrest induced by Avelox combined with her antiarrhythmics. She states she is feeling well from a cardiac standpoint. Denies chest pain, leg swelling, increase exertional dyspnea.  3. asthma. Exacerbation is finally improved she says feels the best today she has felt in 7 weeks. Essentially has been on prednisone for that duration. Seen by her pulmonologist yesterday and had the flu shot already. Denies fever, chills, productive cough.  Review of Systems See HPI otherwise negative.  reports that she has never smoked. She has never used smokeless tobacco.     Objective:   Physical Exam  Vitals reviewed. Constitutional: She is oriented to person, place, and time. She appears well-developed and well-nourished. No distress.  HENT:  Head: Normocephalic and atraumatic.  Neck: Neck supple. No thyromegaly present.  Cardiovascular: Normal rate, regular rhythm and normal heart sounds.   No murmur heard. Pulmonary/Chest: Effort normal and breath sounds normal. No respiratory distress. She has no wheezes. She has no rales.  Abdominal: Soft.  Musculoskeletal: She  exhibits no edema and no tenderness.  Neurological: She is alert and oriented to person, place, and time.  Skin: No rash noted. She is not diaphoretic.       Right foot has a callus at the fifth metatarsal pad. No ulcerations. Gross diminished sensation to light touch bilaterally.  Psychiatric: She has a normal mood and affect.          Assessment & Plan:

## 2012-01-20 ENCOUNTER — Encounter: Payer: Self-pay | Admitting: Family Medicine

## 2012-02-04 DIAGNOSIS — I4891 Unspecified atrial fibrillation: Secondary | ICD-10-CM | POA: Diagnosis not present

## 2012-02-04 DIAGNOSIS — Z7901 Long term (current) use of anticoagulants: Secondary | ICD-10-CM | POA: Diagnosis not present

## 2012-02-09 ENCOUNTER — Other Ambulatory Visit: Payer: Self-pay | Admitting: Family Medicine

## 2012-02-10 ENCOUNTER — Other Ambulatory Visit: Payer: Self-pay | Admitting: Family Medicine

## 2012-02-12 ENCOUNTER — Encounter: Payer: Self-pay | Admitting: Family Medicine

## 2012-02-12 ENCOUNTER — Ambulatory Visit (INDEPENDENT_AMBULATORY_CARE_PROVIDER_SITE_OTHER): Payer: Medicare Other | Admitting: Family Medicine

## 2012-02-12 VITALS — BP 131/85 | HR 80 | Temp 98.0°F | Ht 67.0 in | Wt 202.0 lb

## 2012-02-12 DIAGNOSIS — M7061 Trochanteric bursitis, right hip: Secondary | ICD-10-CM

## 2012-02-12 DIAGNOSIS — M76899 Other specified enthesopathies of unspecified lower limb, excluding foot: Secondary | ICD-10-CM | POA: Diagnosis not present

## 2012-02-12 MED ORDER — TRAMADOL HCL 50 MG PO TABS: 50.0000 mg | ORAL_TABLET | Freq: Three times a day (TID) | ORAL | Status: DC | PRN

## 2012-02-12 NOTE — Patient Instructions (Addendum)
Try Tramadol 50 mg every 8 hours as needed for pain It should improve in the next few days  Adjust your Lantus based on your morning sugars: <150 Current dose 150-200 20 units 200-250 25 units 250-300 30 units >300 30 units and come in for a visit  Follow-up in 2 weeks if the pain is still significant or sooner if needed (such as if you have worsening hip pain and fever)  Follow-up in 3 months for your diabetes

## 2012-02-14 NOTE — Progress Notes (Signed)
  Subjective:    Patient ID: [name], female    DOB: [birth year], 61 y.o.   MRN: [id number]  HPI # Right hip pain She has a history of arthritis and pain in that hip, however, the pain worsened 3 days ago.  She had an injection in her hip in the past about a year ago that alleviated her pain.  The pain is worse when she moves her hip but not at any particular time of day. She has tried Tylenol a couple of tablets at a time a few times a day but it does not seem to help her pain.   ROS: denies fevers, chills, nausea  She recently had a significant severe exacerbation of her asthma and had been on prednisone for more than a month. She finished her course about a couple of weeks ago.  Review of Systems Per HPI  Allergies, medication, past medical history reviewed.  Diabetes with neuropathy on insulin. 01/2012 HgbA1c 12.0. Her sugars are usually in the upper 100s to 200s. She checks it in the morning and in the evening. History of right trochanteric bursitis s/p injection 11/02/2011 Knee arthritis AF, systolic heart failure with pacemaker Hip x-ray 12/2009 shows mild degenerative changes with L hip > R hip    Objective:   Physical Exam GEN: NAD; obese RIGHT HIP PSYCH: Normally interactive. Conversant. Not depressed or anxious appearing.  Calm demeanor.  HIP EXAM: SIDE: RIGHT ROM: decreased external rotation and internal rotation compared to left Pain with terminal IROM and EROM SLR: NEG Knees: No effusion Strength intact Significant point tenderness over greater trochanter  Procedure: right trochanteric bursa glucocorticoid injection Informed written consent received  Area to be injected marked and then prepped with Betadine swabs x 2 and alcohol swabs x 2 25 gauge needle inserted and 4 mL of 2% lidocaine without epinephrine and 80 mg of methylprednisone injected Needle removed and area covered with band-aide Minimal blood loss Patient informed that pain may improve in next  few hours but will be about the same prior to injection tomorrow morning. Expect improvement in 2 days and maximum affect in about a week. Patient expressed understanding.     Assessment & Plan:

## 2012-02-14 NOTE — Assessment & Plan Note (Signed)
Exacerbation of chronic right hip pain and with signs of right trochanteric bursitis. She has just finished an extended course of prednisone that was probably helping her arthritis pain which has since then returned. Glucocorticoid injection performed today. Since Tylenol was not helping and we would like to avoid NSAIDs with her severe systolic heart failure, Rx for Tramadol given to use prn.

## 2012-02-17 ENCOUNTER — Other Ambulatory Visit: Payer: Self-pay | Admitting: Family Medicine

## 2012-02-17 MED ORDER — LOSARTAN POTASSIUM 50 MG PO TABS: 50.0000 mg | ORAL_TABLET | Freq: Every day | ORAL | Status: DC

## 2012-02-22 ENCOUNTER — Encounter: Payer: Self-pay | Admitting: Family Medicine

## 2012-02-22 ENCOUNTER — Ambulatory Visit (INDEPENDENT_AMBULATORY_CARE_PROVIDER_SITE_OTHER): Payer: Medicare Other | Admitting: Family Medicine

## 2012-02-22 VITALS — BP 125/89 | HR 89 | Temp 97.7°F | Ht 67.0 in | Wt 201.0 lb

## 2012-02-22 DIAGNOSIS — N182 Chronic kidney disease, stage 2 (mild): Secondary | ICD-10-CM

## 2012-02-22 DIAGNOSIS — M76899 Other specified enthesopathies of unspecified lower limb, excluding foot: Secondary | ICD-10-CM | POA: Diagnosis not present

## 2012-02-22 DIAGNOSIS — M7061 Trochanteric bursitis, right hip: Secondary | ICD-10-CM

## 2012-02-22 DIAGNOSIS — E119 Type 2 diabetes mellitus without complications: Secondary | ICD-10-CM | POA: Diagnosis not present

## 2012-02-22 DIAGNOSIS — Z23 Encounter for immunization: Secondary | ICD-10-CM

## 2012-02-22 HISTORY — DX: Chronic kidney disease, stage 2 (mild): N18.2

## 2012-02-22 MED ORDER — INSULIN GLARGINE 100 UNIT/ML ~~LOC~~ SOLN: 18.0000 [IU] | Freq: Every day | SUBCUTANEOUS | Status: DC

## 2012-02-22 NOTE — Assessment & Plan Note (Signed)
Pain is improved. Advised her to avoid medications unless needed.

## 2012-02-22 NOTE — Progress Notes (Signed)
  Subjective:    Patient ID: [name], female    DOB: 01/23/1951, 61 y.o.   MRN: [id number]  HPI  1. DM2 f/u. A1c was 12.0 and worsened last month. Patient desired to give herself some time off of steroid treatment (recently on extended course with asthma exacerbation) to see if CBGs improved. She reports CBGs of fasting 160s typically. The lowest she has had in the past month 119. She reports nighttime values more like 180s to 170s. She denies any hypoglycemic episodes recently, but does experience symptoms alerting her to this phenomenon. She denies any weight loss, fatigue.  2. hip pain. The patient received a bursitis injection last week. Much improved pain. She is also using tramadol when needed and is very happy with this treatment. Denies any falls, weakness, numbness, injury.  3. CKD. Her GFR is calculated at 69. She is not taking any NSAIDs. She is on Cozaar. Her cr range 1.2-1.5 in past 2 years.  Review of Systems See HPI otherwise negative.  reports that she has never smoked. She has never used smokeless tobacco.     Objective:   Physical Exam  Vitals reviewed. Constitutional: She appears well-developed and well-nourished. No distress.  HENT:  Head: Normocephalic and atraumatic.  Mouth/Throat: Oropharynx is clear and moist.  Eyes: EOM are normal.  Cardiovascular: Normal rate.   Pulmonary/Chest: Effort normal.  Skin: She is not diaphoretic.       Assessment & Plan:

## 2012-02-22 NOTE — Assessment & Plan Note (Addendum)
Worsened control on steroids for asthma, but this therapy was completed over one month ago. She continues to have elevated fasting and postprandial values in 160 range. She did have a recent bursa steroid injection one week ago, but this does not explain the previous 4 weeks of hyperglycemia. We discussed increasing the Lantus by 3 units to 18 units daily. I encouraged the patient to keep checking her daily fasting values and she may increase Lantus further by 2 units if this value remains above her goal of 100-130. Discussed to call for her concerns or any hypoglycemia. Followup in one month. She has diabetic shoe prescription to fill.

## 2012-02-22 NOTE — Patient Instructions (Addendum)
Nice to see you. Your blood sugars are still too high. Increase lantus to 18 units. If your blood sugars are not at goal 100-130, then you may increase lantus to 20 or 22 units every few days.   Diabetes and Exercise Regular exercise is important and can help:   Control blood glucose (sugar).  Decrease blood pressure.    Control blood lipids (cholesterol, triglycerides).  Improve overall health. BENEFITS FROM EXERCISE  Improved fitness.  Improved flexibility.  Improved endurance.  Increased bone density.  Weight control.  Increased muscle strength.  Decreased body fat.  Improvement of the body's use of insulin, a hormone.  Increased insulin sensitivity.  Reduction of insulin needs.  Reduced stress and tension.  Helps you feel better. People with diabetes who add exercise to their lifestyle gain additional benefits, including:  Weight loss.  Reduced appetite.  Improvement of the body's use of blood glucose.  Decreased risk factors for heart disease:  Lowering of cholesterol and triglycerides.  Raising the level of good cholesterol (high-density lipoproteins, HDL).  Lowering blood sugar.  Decreased blood pressure. TYPE 1 DIABETES AND EXERCISE  Exercise will usually lower your blood glucose.  If blood glucose is greater than 240 mg/dl, check urine ketones. If ketones are present, do not exercise.  Location of the insulin injection sites may need to be adjusted with exercise. Avoid injecting insulin into areas of the body that will be exercised. For example, avoid injecting insulin into:  The arms when playing tennis.  The legs when jogging. For more information, discuss this with your caregiver.  Keep a record of:  Food intake.  Type and amount of exercise.  Expected peak times of insulin action.  Blood glucose levels. Do this before, during, and after exercise. Review your records with your caregiver. This will help you to develop  guidelines for adjusting food intake and insulin amounts.  TYPE 2 DIABETES AND EXERCISE  Regular physical activity can help control blood glucose.  Exercise is important because it may:  Increase the body's sensitivity to insulin.  Improve blood glucose control.  Exercise reduces the risk of heart disease. It decreases serum cholesterol and triglycerides. It also lowers blood pressure.  Those who take insulin or oral hypoglycemic agents should watch for signs of hypoglycemia. These signs include dizziness, shaking, sweating, chills, and confusion.  Body water is lost during exercise. It must be replaced. This will help to avoid loss of body fluids (dehydration) or heat stroke. Be sure to talk to your caregiver before starting an exercise program to make sure it is safe for you. Remember, any activity is better than none.  Document Released: 06/20/2003 Document Revised: 06/22/2011 Document Reviewed: 10/04/2008 [facility] Patient Information 2013 [place], [place].

## 2012-02-22 NOTE — Assessment & Plan Note (Addendum)
Estimated Creatinine Clearance: 69.3 ml/min (by C-G formula based on Cr of 1). Currently is stable and checked this month. Present for > 2 years. Patient was unaware of this diagnosis so we discussed implications: needed improved glycemic control and BP control. Avoid NSAIDs. Continue on ARB therapy. Followup labs in 2-3 months.

## 2012-02-23 ENCOUNTER — Telehealth: Payer: Self-pay | Admitting: Family Medicine

## 2012-02-23 ENCOUNTER — Encounter: Payer: Self-pay | Admitting: Family Medicine

## 2012-02-23 NOTE — Telephone Encounter (Signed)
Daughter is calling to speak to the doctor about the visit yesterday regarding the Dx of Chronic Kidney Disease Stage 2.  Patient and daughter are concerned because this wasn't discussed at the appt, there wasn't any testing done and they are wondering if this was an error.

## 2012-02-23 NOTE — Telephone Encounter (Signed)
Called pt to explain CKD stage 2. This has been present for >2 yrs and creatinine is actually improved from previous values this year. Already on ARB. Discussed avoidance nephrotoxins. She asked me to call her daughter to explain also. I left detailed VM explaining the same.

## 2012-02-29 ENCOUNTER — Ambulatory Visit (INDEPENDENT_AMBULATORY_CARE_PROVIDER_SITE_OTHER): Payer: Medicare Other | Admitting: Family Medicine

## 2012-02-29 ENCOUNTER — Encounter: Payer: Self-pay | Admitting: Family Medicine

## 2012-02-29 ENCOUNTER — Telehealth: Payer: Self-pay | Admitting: *Deleted

## 2012-02-29 VITALS — BP 140/84 | HR 82 | Temp 98.8°F | Ht 67.0 in | Wt 202.0 lb

## 2012-02-29 DIAGNOSIS — H9209 Otalgia, unspecified ear: Secondary | ICD-10-CM | POA: Diagnosis not present

## 2012-02-29 MED ORDER — NEOMYCIN-POLYMYXIN-HC 3.5-10000-1 OT SOLN: 3.0000 [drp] | Freq: Four times a day (QID) | OTIC | Status: DC

## 2012-02-29 MED ORDER — HYDROCORTISONE-ACETIC ACID 1-2 % OT SOLN: 3.0000 [drp] | Freq: Three times a day (TID) | OTIC | Status: DC

## 2012-02-29 NOTE — Telephone Encounter (Signed)
[facility] calling.  Rx for Vosol-HC drops are not covered by insurance and will cost patient $80. 00.  Pharmacy is requesting Dr. Louanne Belton send in new Rx for Cortisporin drops.  Paged to Dr Louanne Belton.  Ileana Ladd

## 2012-02-29 NOTE — Patient Instructions (Signed)
I have sent in a prescription for some ear drops.  Put them in your ear 3-4 times per day for the next several days.  If your ear is not feeling better in 1 week, come back to see [place].

## 2012-02-29 NOTE — Progress Notes (Signed)
Patient ID: [name], female   DOB: [birth year], 61 y.o.   MRN: [id number] Subjective: The patient is a 61 y.o. year old female who presents today for left ear discomfort.  Pt has left ear discomfort that has been present now for 4 days.  Feels as though "something is rolling around in there."  No fevers, chills, rhinorrhea, cough, sore throat, pain with mastication, changes in hearing, or discharge from ear.  Patient's past medical, social, and family history were reviewed and updated as appropriate. History  Substance Use Topics  . Smoking status: Never Smoker   . Smokeless tobacco: Never Used  . Alcohol Use: No   Objective:  Filed Vitals:   02/29/12 0952  BP: 140/84  Pulse: 82  Temp: 98.8 F (37.1 C)   Gen: NAD HEENT: MMM, no significant rhinorrhea, TM normal on right, on left TM is slightly duller than on right, no effusion, no canal drainage, no cerumen, no TM perforation.  No pain on movement of auricle.  Assessment/Plan: Ear discomfort of uncertain etiology.  Possibly related to irritation of canal from lack of cerumen.  Will rx Vosol-HC for 7 days.  If does not improve would consider referral to ENT for eval.  Please also see individual problems in problem list for problem-specific plans.

## 2012-03-03 DIAGNOSIS — Z7901 Long term (current) use of anticoagulants: Secondary | ICD-10-CM | POA: Diagnosis not present

## 2012-03-03 DIAGNOSIS — I4891 Unspecified atrial fibrillation: Secondary | ICD-10-CM | POA: Diagnosis not present

## 2012-03-16 ENCOUNTER — Other Ambulatory Visit: Payer: Self-pay | Admitting: Family Medicine

## 2012-03-16 DIAGNOSIS — Z1231 Encounter for screening mammogram for malignant neoplasm of breast: Secondary | ICD-10-CM

## 2012-03-22 ENCOUNTER — Other Ambulatory Visit: Payer: Self-pay | Admitting: Family Medicine

## 2012-03-24 ENCOUNTER — Ambulatory Visit (INDEPENDENT_AMBULATORY_CARE_PROVIDER_SITE_OTHER): Payer: Medicare Other | Admitting: Family Medicine

## 2012-03-24 ENCOUNTER — Encounter: Payer: Self-pay | Admitting: Family Medicine

## 2012-03-24 VITALS — BP 121/81 | HR 86 | Temp 98.3°F | Ht 67.0 in | Wt 206.5 lb

## 2012-03-24 DIAGNOSIS — I10 Essential (primary) hypertension: Secondary | ICD-10-CM | POA: Diagnosis not present

## 2012-03-24 DIAGNOSIS — I4891 Unspecified atrial fibrillation: Secondary | ICD-10-CM

## 2012-03-24 DIAGNOSIS — E119 Type 2 diabetes mellitus without complications: Secondary | ICD-10-CM | POA: Diagnosis not present

## 2012-03-24 DIAGNOSIS — N182 Chronic kidney disease, stage 2 (mild): Secondary | ICD-10-CM

## 2012-03-24 DIAGNOSIS — I5022 Chronic systolic (congestive) heart failure: Secondary | ICD-10-CM

## 2012-03-24 NOTE — Assessment & Plan Note (Signed)
At goal, no changes

## 2012-03-24 NOTE — Assessment & Plan Note (Signed)
Glycemic control much improved with bump in lantus and off prednisone. Patient is happy without report of hypoglycemia. Will continue and f/u A1c in 2 months.

## 2012-03-24 NOTE — Progress Notes (Signed)
  Subjective:    Patient ID: CAMRYNN MCCLINTIC, female    DOB: [birth year], 61 y.o.   MRN: [id number]  HPI F/u DM2  1. DM2. Previously had worsened control during steroid burst. She has found good control again with increase in lantus to 18 units. Log book shows fasting CBGs 91-151 and qhs 95-186. Patient seems pleased.  2. Chronic systolic CHF. Patient does note a weight increase of 4 lbs and trace LE edema. She has an EF 35-40% and staying on top of her condition. She plans to establish with a new MD (bc Dr. Clarene Duke retired) at the end of this month at [facility]. Patient denies exertional dyspnea, cough, fever. Compliant with diuretics. She feels comfortable with dosing an extra torsemide which she has done previously.  3. Atrial fibrillation. On coumadin getting INR at [facility]. Stable INR recently she reports. No palpitaions, chest pain or medication side effects.   4. Ear pain. Improved with drops. No drainage or fever.  Review of Systems See HPI otherwise negative.  reports that she has never smoked. She has never used smokeless tobacco.     Objective:   Physical Exam  Vitals reviewed. Constitutional: She is oriented to person, place, and time. She appears well-developed and well-nourished. No distress.  HENT:  Head: Normocephalic and atraumatic.  Eyes: EOM are normal.  Neck: Neck supple.  Cardiovascular: Normal rate, regular rhythm and normal heart sounds.   No murmur heard. Pulmonary/Chest: Effort normal and breath sounds normal. No respiratory distress. She has no wheezes. She has no rales.  Abdominal: Soft.  Musculoskeletal: She exhibits edema.       Trace B LE edema, nontender.  Neurological: She is alert and oriented to person, place, and time. No cranial nerve deficit. Coordination normal.  Skin: No rash noted. She is not diaphoretic.  Psychiatric: She has a normal mood and affect. Her behavior is normal.       Smiling and pleasant today.       Assessment & Plan:

## 2012-03-24 NOTE — Patient Instructions (Addendum)
NIce to see you. Your blood sugars are great today. You can take extra torsemide tablet for next 2 days, then resume to once daily. Make sure to weigh yourself daily and notify MD if elevated 2 lb in one day. Avoid excess fluids and salt. Will be due for labs and diabetes check in 2 months.  Heart Failure Heart failure means your heart has trouble pumping blood. The blood is not circulated very well in your body because your heart is weak. Heart failure may cause blood to back up into your lungs. This is commonly called "fluid in the lungs." Heart failure may also cause your ankles and legs to puff up (swell). It is important to take good care of yourself when you have heart failure. HOME CARE Medicine  Take your medicine as told by your doctor.  Do not stop taking your medicine unless told to by your doctor.  Be sure to get your medicine refilled before it runs out.  Do not skip any doses of medicine.  Tell your doctor if you cannot afford your medicine.  Keep a list of all the medicine you take. This should include the name, how much you take, and when you take it.  Ask your doctor if you have any questions about your medicine. Do not take over-the-counter medicine unless your doctor says it is okay. What you eat  Do not drink alcohol unless your doctor says it is okay.  Avoid food that is high in fat. Avoid foods fried in oil or made with fat.  Eat a healthy diet. A dietitian can help you with healthy food choices.  Limit how much salt you eat. Do not eat more than 1500 mg of salt (sodium) a day.  Do not add salt to your food.  Do not eat food made with a lot of salt. Here are some examples:  Canned vegetables.  Canned soups.  Canned drinks.  Hot dogs.  Fast food.  Pizza.  Chips. Check your weight  Weigh yourself every morning. You should do this after you pee (urinate) and before you eat breakfast.  Wear the same amount of clothes each time you weigh  yourself.  Write down your weight every day. Tell your doctor if you gain 3 lb/1.4 kg or more in 1 day or 5 lb/2.3 kg in a week. Blood pressure monitoring  Buy a home blood pressure cuff.  Check your blood pressure as told by your doctor. Write down your blood pressure numbers on a sheet of paper.  Bring your blood pressure numbers to your doctor visits. Smoking  Smoking is bad for your heart.  Ask your doctor how to stop smoking. Exercise  Talk to your doctor about exercise.  Ask how much exercise is right for you.  Exercise as much as you can. Stop if you feel tired, have problems breathing, or have chest pain. Keep all your doctor appointments. GET HELP IF:   You gain 3 lb/1.4 kg or more in 1 day or 5 lb/2.3 kg in a week.  You have trouble breathing.  You have a cough that does not go away.  You have puffy ankles or legs.  You have an enlarged (bloated) belly (abdomen).  You cannot sleep because you have trouble breathing. GET HELP RIGHT AWAY IF:   You pass out (faint).  You have really bad chest pain or pressure. This includes pain or pressure in your:  Arms.  Jaw.  Neck.  Back. MAKE SURE YOU:  Understand these instructions.  Will watch your condition.  Will get help right away if you are not doing well or get worse. Document Released: 01/07/2008 Document Revised: 09/29/2011 Document Reviewed: 07/31/2008 [facility] Patient Information 2013 [place], [place].

## 2012-03-24 NOTE — Assessment & Plan Note (Signed)
Check labs in 2-3 months.

## 2012-03-24 NOTE — Assessment & Plan Note (Signed)
To establish with new Cardiologist at [facility] for digoxin, tikosyn monitoring. Asymptomatic today, coumadin stable recently per history.

## 2012-03-24 NOTE — Assessment & Plan Note (Signed)
Weight elevated 4 lbs with trace LE edema (206 lb today). Advised patient to increase torsemide to two tablets daily x next 2-3 days and monitor symptoms and daily weights. She will resume previous dose in 3 days. She is reliable, so agrees to call for problems. Discussed fluid and salt restrictions.

## 2012-03-25 ENCOUNTER — Other Ambulatory Visit: Payer: Self-pay | Admitting: Family Medicine

## 2012-03-31 ENCOUNTER — Other Ambulatory Visit: Payer: Self-pay | Admitting: Family Medicine

## 2012-04-01 DIAGNOSIS — I4891 Unspecified atrial fibrillation: Secondary | ICD-10-CM | POA: Diagnosis not present

## 2012-04-01 DIAGNOSIS — Z7901 Long term (current) use of anticoagulants: Secondary | ICD-10-CM | POA: Diagnosis not present

## 2012-04-02 ENCOUNTER — Other Ambulatory Visit: Payer: Self-pay | Admitting: Family Medicine

## 2012-04-12 DIAGNOSIS — I509 Heart failure, unspecified: Secondary | ICD-10-CM | POA: Diagnosis not present

## 2012-04-12 DIAGNOSIS — I4891 Unspecified atrial fibrillation: Secondary | ICD-10-CM | POA: Diagnosis not present

## 2012-04-12 DIAGNOSIS — I472 Ventricular tachycardia: Secondary | ICD-10-CM | POA: Diagnosis not present

## 2012-04-26 ENCOUNTER — Ambulatory Visit
Admission: RE | Admit: 2012-04-26 | Discharge: 2012-04-26 | Disposition: A | Discharge status: Home / Self Care | Payer: Medicare Other | Source: Ambulatory Visit | Attending: Family Medicine | Admitting: Family Medicine

## 2012-04-26 DIAGNOSIS — Z1231 Encounter for screening mammogram for malignant neoplasm of breast: Secondary | ICD-10-CM | POA: Diagnosis not present

## 2012-04-27 ENCOUNTER — Ambulatory Visit: Payer: Medicare Other | Admitting: Family Medicine

## 2012-05-04 ENCOUNTER — Encounter: Payer: Self-pay | Admitting: Pharmacist Clinician (PhC)/ Clinical Pharmacy Specialist

## 2012-05-04 ENCOUNTER — Other Ambulatory Visit: Payer: Self-pay | Admitting: Critical Care Medicine

## 2012-05-04 DIAGNOSIS — R9431 Abnormal electrocardiogram [ECG] [EKG]: Secondary | ICD-10-CM | POA: Diagnosis not present

## 2012-05-04 DIAGNOSIS — I499 Cardiac arrhythmia, unspecified: Secondary | ICD-10-CM | POA: Diagnosis not present

## 2012-05-10 DIAGNOSIS — I4891 Unspecified atrial fibrillation: Secondary | ICD-10-CM | POA: Diagnosis not present

## 2012-05-10 DIAGNOSIS — Z7901 Long term (current) use of anticoagulants: Secondary | ICD-10-CM | POA: Diagnosis not present

## 2012-05-16 ENCOUNTER — Ambulatory Visit (INDEPENDENT_AMBULATORY_CARE_PROVIDER_SITE_OTHER): Payer: Medicare Other | Admitting: Family Medicine

## 2012-05-16 ENCOUNTER — Encounter: Payer: Self-pay | Admitting: Family Medicine

## 2012-05-16 VITALS — BP 136/84 | HR 90 | Temp 97.7°F | Ht 67.0 in | Wt 204.2 lb

## 2012-05-16 DIAGNOSIS — I4891 Unspecified atrial fibrillation: Secondary | ICD-10-CM | POA: Diagnosis not present

## 2012-05-16 DIAGNOSIS — E119 Type 2 diabetes mellitus without complications: Secondary | ICD-10-CM | POA: Diagnosis not present

## 2012-05-16 DIAGNOSIS — I10 Essential (primary) hypertension: Secondary | ICD-10-CM | POA: Diagnosis not present

## 2012-05-16 DIAGNOSIS — I5022 Chronic systolic (congestive) heart failure: Secondary | ICD-10-CM | POA: Diagnosis not present

## 2012-05-16 LAB — CBC
HCT: 37.3 % (ref 36.0–46.0)
Hemoglobin: 12.2 g/dL (ref 12.0–15.0)
MCHC: 32.7 g/dL (ref 30.0–36.0)
MCV: 86.9 fL (ref 78.0–100.0)
RDW: 13.7 % (ref 11.5–15.5)

## 2012-05-16 LAB — COMPREHENSIVE METABOLIC PANEL
ALT: 20 U/L (ref 0–35)
AST: 15 U/L (ref 0–37)
BUN: 15 mg/dL (ref 6–23)
Calcium: 9.2 mg/dL (ref 8.4–10.5)
Chloride: 99 mEq/L (ref 96–112)
Creat: 1.03 mg/dL (ref 0.50–1.10)
Total Bilirubin: 0.4 mg/dL (ref 0.3–1.2)

## 2012-05-16 LAB — POCT GLYCOSYLATED HEMOGLOBIN (HGB A1C): Hemoglobin A1C: 9.9

## 2012-05-16 MED ORDER — INSULIN GLARGINE 100 UNIT/ML ~~LOC~~ SOLN: 20.0000 [IU] | Freq: Every day | SUBCUTANEOUS | Status: DC

## 2012-05-16 NOTE — Assessment & Plan Note (Signed)
Stable on coumadin, tikosyn, digoxin. She has regular EP and cardiology f/u.

## 2012-05-16 NOTE — Assessment & Plan Note (Signed)
Improved but still above goal <8.0. Will increase lantus to 20 units from 18. She agrees to notify if any hypoglycemic episodes. She plans to start exercising more which I encouraged. Has diabetic shoes and checks daily. F/u in 3 months.

## 2012-05-16 NOTE — Patient Instructions (Addendum)
Nice to see you. Your A1c is better, ultimate goal is < 8.0. Increase lantus to 20 units daily. If you have low blood sugar less than 70, eat a snack and notify doctor. Good job with restarting exercise.  Make appointment in 3 months or sooner if needed.     Diabetes and Exercise Regular exercise is important and can help:   Control blood glucose (sugar).  Decrease blood pressure.    Control blood lipids (cholesterol, triglycerides).  Improve overall health. BENEFITS FROM EXERCISE  Improved fitness.  Improved flexibility.  Improved endurance.  Increased bone density.  Weight control.  Increased muscle strength.  Decreased body fat.  Improvement of the body's use of insulin, a hormone.  Increased insulin sensitivity.  Reduction of insulin needs.  Reduced stress and tension.  Helps you feel better. People with diabetes who add exercise to their lifestyle gain additional benefits, including:  Weight loss.  Reduced appetite.  Improvement of the body's use of blood glucose.  Decreased risk factors for heart disease:  Lowering of cholesterol and triglycerides.  Raising the level of good cholesterol (high-density lipoproteins, HDL).  Lowering blood sugar.  Decreased blood pressure. TYPE 1 DIABETES AND EXERCISE  Exercise will usually lower your blood glucose.  If blood glucose is greater than 240 mg/dl, check urine ketones. If ketones are present, do not exercise.  Location of the insulin injection sites may need to be adjusted with exercise. Avoid injecting insulin into areas of the body that will be exercised. For example, avoid injecting insulin into:  The arms when playing tennis.  The legs when jogging. For more information, discuss this with your caregiver.  Keep a record of:  Food intake.  Type and amount of exercise.  Expected peak times of insulin action.  Blood glucose levels. Do this before, during, and after exercise. Review your  records with your caregiver. This will help you to develop guidelines for adjusting food intake and insulin amounts.  TYPE 2 DIABETES AND EXERCISE  Regular physical activity can help control blood glucose.  Exercise is important because it may:  Increase the body's sensitivity to insulin.  Improve blood glucose control.  Exercise reduces the risk of heart disease. It decreases serum cholesterol and triglycerides. It also lowers blood pressure.  Those who take insulin or oral hypoglycemic agents should watch for signs of hypoglycemia. These signs include dizziness, shaking, sweating, chills, and confusion.  Body water is lost during exercise. It must be replaced. This will help to avoid loss of body fluids (dehydration) or heat stroke. Be sure to talk to your caregiver before starting an exercise program to make sure it is safe for you. Remember, any activity is better than none.  Document Released: 06/20/2003 Document Revised: 06/22/2011 Document Reviewed: 10/04/2008 [facility] Patient Information 2013 [place], [place].

## 2012-05-16 NOTE — Assessment & Plan Note (Signed)
At goal. Check labs and potassium. F/u in 3 months.

## 2012-05-16 NOTE — Progress Notes (Signed)
  Subjective:    Patient ID: [name], female    DOB: December 02, 1950, 62 y.o.   MRN: [id number]  HPI  1. DM2. Improved A1c is 9.9 from >12. This is with increasing lantus dose and being off steroids. Reports fasting CBGs 120-150s. On glipizide also. Denies hypoglycemia, foot ulcers/pain. Endorses some foot numbness and corns.  2. Chronic CHF. She successfully treated a slight volume overload in December with increasing torsemide for a few days. Now back down to 20 mg daily. No increased edema, dyspnea, cough, wheezing, chest pain. Established with Dr. Royann Shivers at [facility] recently.   3. Atrial fibrillation. Remains on coumadin, tikosyn, digoxin. Denies palpitations, weight gain, visual changes. Sees EP MD in [place] regularly.   Review of Systems See HPI otherwise negative.  reports that she has never smoked. She has never used smokeless tobacco.     Objective:   Physical Exam  Vitals reviewed. Constitutional: She is oriented to person, place, and time. She appears well-developed and well-nourished. No distress.  HENT:  Head: Normocephalic and atraumatic.  Mouth/Throat: Oropharynx is clear and moist. No oropharyngeal exudate.  Eyes: EOM are normal.  Cardiovascular: Normal rate and normal heart sounds.   No murmur heard.      Irregularly irregular  Pulmonary/Chest: Effort normal and breath sounds normal. No respiratory distress. She has no wheezes. She has no rales.  Musculoskeletal: She exhibits no edema and no tenderness.       Right foot two clean corns at 2-3rd metatarsal heads.  Sensation diminished to light touch bilateral distal soles.  Neurological: She is alert and oriented to person, place, and time.  Skin: She is not diaphoretic.  Psychiatric: She has a normal mood and affect.       Assessment & Plan:

## 2012-05-16 NOTE — Assessment & Plan Note (Signed)
Stable with weight down 2 lbs, asymptomatic. Check labs. F/u prn.

## 2012-05-17 ENCOUNTER — Encounter: Payer: Self-pay | Admitting: Family Medicine

## 2012-05-24 ENCOUNTER — Ambulatory Visit: Payer: Medicare Other | Admitting: Critical Care Medicine

## 2012-06-07 DIAGNOSIS — Z7901 Long term (current) use of anticoagulants: Secondary | ICD-10-CM | POA: Diagnosis not present

## 2012-06-07 DIAGNOSIS — I4891 Unspecified atrial fibrillation: Secondary | ICD-10-CM | POA: Diagnosis not present

## 2012-06-12 DIAGNOSIS — I4891 Unspecified atrial fibrillation: Secondary | ICD-10-CM | POA: Diagnosis not present

## 2012-06-12 DIAGNOSIS — I495 Sick sinus syndrome: Secondary | ICD-10-CM | POA: Diagnosis not present

## 2012-06-12 DIAGNOSIS — I472 Ventricular tachycardia: Secondary | ICD-10-CM | POA: Diagnosis not present

## 2012-06-16 ENCOUNTER — Other Ambulatory Visit: Payer: Self-pay | Admitting: Critical Care Medicine

## 2012-06-30 ENCOUNTER — Other Ambulatory Visit: Payer: Self-pay | Admitting: Family Medicine

## 2012-06-30 ENCOUNTER — Ambulatory Visit: Payer: Self-pay | Admitting: Cardiovascular Disease

## 2012-06-30 DIAGNOSIS — I4891 Unspecified atrial fibrillation: Secondary | ICD-10-CM | POA: Diagnosis not present

## 2012-06-30 DIAGNOSIS — I509 Heart failure, unspecified: Secondary | ICD-10-CM | POA: Diagnosis not present

## 2012-06-30 DIAGNOSIS — Z7901 Long term (current) use of anticoagulants: Secondary | ICD-10-CM | POA: Insufficient documentation

## 2012-07-04 DIAGNOSIS — M1710 Unilateral primary osteoarthritis, unspecified knee: Secondary | ICD-10-CM | POA: Diagnosis not present

## 2012-07-04 DIAGNOSIS — IMO0002 Reserved for concepts with insufficient information to code with codable children: Secondary | ICD-10-CM | POA: Diagnosis not present

## 2012-07-06 ENCOUNTER — Ambulatory Visit (INDEPENDENT_AMBULATORY_CARE_PROVIDER_SITE_OTHER): Payer: Medicare Other | Admitting: Critical Care Medicine

## 2012-07-06 ENCOUNTER — Encounter: Payer: Self-pay | Admitting: Critical Care Medicine

## 2012-07-06 VITALS — BP 128/82 | HR 90 | Temp 97.7°F | Ht 67.0 in | Wt 210.0 lb

## 2012-07-06 DIAGNOSIS — J45909 Unspecified asthma, uncomplicated: Secondary | ICD-10-CM | POA: Diagnosis not present

## 2012-07-06 MED ORDER — BECLOMETHASONE DIPROPIONATE 40 MCG/ACT IN AERS: 2.0000 | INHALATION_SPRAY | Freq: Two times a day (BID) | RESPIRATORY_TRACT | Status: DC

## 2012-07-06 NOTE — Progress Notes (Signed)
Subjective:    Patient ID: [name], female    DOB: [birth year], 62 y.o.   MRN: [id number]  HPI 62 y.o.  AAF with known hx of Asthma , VCD, CHF and Atrial fib on chronic coumadin   07/06/2012 NO issues with cough or wheezing.  No chest pain.  No cough Pt denies any significant sore throat, nasal congestion or excess secretions, fever, chills, sweats, unintended weight loss, pleurtic or exertional chest pain, orthopnea PND, or leg swelling Pt denies any increase in rescue therapy over baseline, denies waking up needing it or having any early am or nocturnal exacerbations of coughing/wheezing/or dyspnea. Pt also denies any obvious fluctuation in symptoms with  weather or environmental change or other alleviating or aggravating factors  Past Medical History  Diagnosis Date  . Atrial fibrillation     ablation x 2 [facility], 01/2006, 2011.  on warfarin  . Hyperlipidemia   . Depression   . Anxiety   . Diabetes mellitus   . Anemia   . Arthritis   . GERD (gastroesophageal reflux disease)   . Sleep apnea   . Vocal cord disease   . CHF (congestive heart failure)     Echo 08/08/10 by [facility]. EF 35-45%. LV systolic function moderately reduced. Moderate global hypokinesis of LV.  RV systolic function moderately reduced. Mild MR. Trace AR.  Marland Kitchen History of ventricular tachycardia     History of VT-VT arrest, now has AICD  . Cardiac arrest jan 2012    in hospital for pneumonia when this occured- occured at the hospital  . AICD (automatic cardioverter/defibrillator) present 06/23/2011  . GASTROESOPHAGEAL REFLUX, NO ESOPHAGITIS 06/10/2006    Qualifier: Diagnosis of  By: Abundio Miu    . Asthma     has had multiple hospitalizations for this  . Non-ischemic cardiomyopathy     echo 08/08/10 - EF 35-45% LV and RV systolic function mod reduced  . Limb pain 06/04/2008    LLE, Baker's cyst in popliteal fossa, no DVT     Family History  Problem Relation Age of Onset  . Diabetes Father   .  Hypertension Father      History   Social History  . Marital Status: Widowed    Spouse Name: N/A    Number of Children: N/A  . Years of Education: N/A   Occupational History  . Not on file.   Social History Main Topics  . Smoking status: Never Smoker   . Smokeless tobacco: Never Used  . Alcohol Use: No  . Drug Use: No  . Sexually Active: No   Other Topics Concern  . Not on file   Social History Narrative   Not employed   Exercise- walking 1 hour daily.    Diet- eating healthy diet.      Allergies  Allergen Reactions  . Avelox (Moxifloxacin Hcl In Nacl)     Cardiac arrest per pt  . Simvastatin Other (See Comments)    myalgias  . Ace Inhibitors Cough    Dry cough      Outpatient Prescriptions Prior to Visit  Medication Sig Dispense Refill  . B-D ULTRAFINE III SHORT PEN 31G X 8 MM MISC use as directed  100 each  9  . buPROPion (WELLBUTRIN XL) 150 MG 24 hr tablet take 1 tablet by mouth once daily  30 tablet  8  . digoxin (LANOXIN) 0.125 MG tablet TAKE 1/2 TABLET BY MOUTH DAILY.  15 tablet  3  .  dofetilide (TIKOSYN) 250 MCG capsule Take 250 mcg by mouth 2 (two) times daily.      . fluticasone (FLONASE) 50 MCG/ACT nasal spray Spray 1 spray into both nostrils twice a day.  16 g  5  . glipiZIDE (GLUCOTROL) 5 MG tablet take 1 tablet by mouth twice a day before meals  120 tablet  3  . insulin glargine (LANTUS SOLOSTAR) 100 UNIT/ML injection Inject 20 Units into the skin at bedtime.  10 mL    . levalbuterol (XOPENEX HFA) 45 MCG/ACT inhaler Inhale 1-2 puffs into the lungs every 4 (four) hours as needed for wheezing.  1 Inhaler  0  . levalbuterol (XOPENEX) 0.63 MG/3ML nebulizer solution one in nebulizer four times daily as needed per Dr Delford Field  360 mL  4  . losartan (COZAAR) 50 MG tablet Take 1 tablet (50 mg total) by mouth daily.  90 tablet  3  . metoprolol (LOPRESSOR) 50 MG tablet take 1 tablet by mouth twice a day  60 tablet  11  . NEXIUM 40 MG capsule take 1 capsule by  mouth once daily  30 each  4  . polyethylene glycol powder (GLYCOLAX/MIRALAX) powder take 17GM (DISSOLVED IN WATER) by mouth once daily  255 g  5  . RA LORATADINE 10 MG tablet take 1 tablet by mouth once daily  30 tablet  5  . spironolactone (ALDACTONE) 25 MG tablet take 1/2 tablet by mouth once daily  30 tablet  6  . torsemide (DEMADEX) 10 MG tablet take 2 tablets by mouth once daily  60 tablet  5  . warfarin (COUMADIN) 5 MG tablet Take 5 mg by mouth daily. As directed. 1.5 tabs 2 days per week. 1 tab 5 days per week      . beclomethasone (QVAR) 40 MCG/ACT inhaler Inhale 3 puffs into the lungs 2 (two) times daily.  1 Inhaler  12  . neomycin-polymyxin-hydrocortisone (CORTISPORIN) otic solution Place 3 drops into the left ear 4 (four) times daily.  10 mL  0  . traMADol (ULTRAM) 50 MG tablet Take 1 tablet (50 mg total) by mouth every 8 (eight) hours as needed for pain.  30 tablet  0   No facility-administered medications prior to visit.      Review of Systems  Constitutional:   No  weight loss, night sweats,  Fevers, chills, fatigue, lassitude. HEENT:   No headaches,  Difficulty swallowing,  Tooth/dental problems,  Sore throat,                No sneezing, itching, ear ache, nasal congestion, + post nasal drip,   CV:  No chest pain,  Orthopnea, PND, swelling in lower extremities, anasarca, dizziness, palpitations  GI  No heartburn, indigestion, abdominal pain, nausea, vomiting, diarrhea, change in bowel habits, loss of appetite  Resp:   No coughing up of blood. Marland Kitchen  No chest wall deformity  Skin: no rash or lesions.  GU: no dysuria, change in color of urine, no urgency or frequency.  No flank pain.  MS:  No joint pain or swelling.  No decreased range of motion.  No back pain.  Psych:  No change in mood or affect. No depression or anxiety.  No memory loss.     Objective:   Physical Exam    Filed Vitals:   07/06/12 1554  BP: 128/82  Pulse: 90  Temp: 97.7 F (36.5 C)  TempSrc:  Oral  Height: 5\' 7"  (1.702 m)  Weight: 210 lb (95.255  kg)  SpO2: 96%    Gen: Pleasant, well-nourished, in no distress,  normal affect  ENT: No lesions,  mouth clear,  oropharynx clear, no postnasal drip  Neck: No JVD, no TMG, no carotid bruits  Lungs: No use of accessory muscles, no dullness to percussion, clear  Cardiovascular: RRR, heart sounds normal, no murmur or gallops, no peripheral edema  Abdomen: soft and NT, no HSM,  BS normal  Musculoskeletal: No deformities, no cyanosis or clubbing  Neuro: alert, non focal  Skin: Warm, no lesions or rashes  No results found.      Assessment & Plan:   Moderate persistent asthma with significant atopic features Moderate persistent asthma stable to improved Plan Reduce Qvar to 2 puff twice daily Return6 months    Updated Medication List Outpatient Encounter Prescriptions as of 07/06/2012  Medication Sig Dispense Refill  . B-D ULTRAFINE III SHORT PEN 31G X 8 MM MISC use as directed  100 each  9  . beclomethasone (QVAR) 40 MCG/ACT inhaler Inhale 2 puffs into the lungs 2 (two) times daily.  1 Inhaler  12  . buPROPion (WELLBUTRIN XL) 150 MG 24 hr tablet take 1 tablet by mouth once daily  30 tablet  8  . digoxin (LANOXIN) 0.125 MG tablet TAKE 1/2 TABLET BY MOUTH DAILY.  15 tablet  3  . dofetilide (TIKOSYN) 250 MCG capsule Take 250 mcg by mouth 2 (two) times daily.      . fluticasone (FLONASE) 50 MCG/ACT nasal spray Spray 1 spray into both nostrils twice a day.  16 g  5  . glipiZIDE (GLUCOTROL) 5 MG tablet take 1 tablet by mouth twice a day before meals  120 tablet  3  . insulin glargine (LANTUS SOLOSTAR) 100 UNIT/ML injection Inject 20 Units into the skin at bedtime.  10 mL    . levalbuterol (XOPENEX HFA) 45 MCG/ACT inhaler Inhale 1-2 puffs into the lungs every 4 (four) hours as needed for wheezing.  1 Inhaler  0  . levalbuterol (XOPENEX) 0.63 MG/3ML nebulizer solution one in nebulizer four times daily as needed per Dr Delford Field  360  mL  4  . losartan (COZAAR) 50 MG tablet Take 1 tablet (50 mg total) by mouth daily.  90 tablet  3  . metoprolol (LOPRESSOR) 50 MG tablet take 1 tablet by mouth twice a day  60 tablet  11  . NEXIUM 40 MG capsule take 1 capsule by mouth once daily  30 each  4  . polyethylene glycol powder (GLYCOLAX/MIRALAX) powder take 17GM (DISSOLVED IN WATER) by mouth once daily  255 g  5  . RA LORATADINE 10 MG tablet take 1 tablet by mouth once daily  30 tablet  5  . spironolactone (ALDACTONE) 25 MG tablet take 1/2 tablet by mouth once daily  30 tablet  6  . torsemide (DEMADEX) 10 MG tablet take 2 tablets by mouth once daily  60 tablet  5  . warfarin (COUMADIN) 5 MG tablet Take 5 mg by mouth daily. As directed. 1.5 tabs 2 days per week. 1 tab 5 days per week      . [DISCONTINUED] beclomethasone (QVAR) 40 MCG/ACT inhaler Inhale 3 puffs into the lungs 2 (two) times daily.  1 Inhaler  12  . [DISCONTINUED] neomycin-polymyxin-hydrocortisone (CORTISPORIN) otic solution Place 3 drops into the left ear 4 (four) times daily.  10 mL  0  . [DISCONTINUED] traMADol (ULTRAM) 50 MG tablet Take 1 tablet (50 mg total) by mouth every 8 (eight)  hours as needed for pain.  30 tablet  0   No facility-administered encounter medications on file as of 07/06/2012.

## 2012-07-06 NOTE — Patient Instructions (Signed)
Reduce Qvar to two puff twice daily Return 6 months

## 2012-07-07 NOTE — Assessment & Plan Note (Addendum)
Moderate persistent asthma stable to improved Plan Reduce Qvar to 2 puff twice daily Return6 months

## 2012-07-10 ENCOUNTER — Other Ambulatory Visit: Payer: Self-pay | Admitting: Family Medicine

## 2012-07-14 DIAGNOSIS — M1710 Unilateral primary osteoarthritis, unspecified knee: Secondary | ICD-10-CM | POA: Diagnosis not present

## 2012-07-17 DIAGNOSIS — I472 Ventricular tachycardia: Secondary | ICD-10-CM | POA: Diagnosis not present

## 2012-07-17 DIAGNOSIS — I509 Heart failure, unspecified: Secondary | ICD-10-CM | POA: Diagnosis not present

## 2012-07-17 DIAGNOSIS — I4891 Unspecified atrial fibrillation: Secondary | ICD-10-CM | POA: Diagnosis not present

## 2012-07-21 DIAGNOSIS — M1710 Unilateral primary osteoarthritis, unspecified knee: Secondary | ICD-10-CM | POA: Diagnosis not present

## 2012-07-27 ENCOUNTER — Other Ambulatory Visit: Payer: Self-pay | Admitting: Family Medicine

## 2012-07-28 DIAGNOSIS — M1710 Unilateral primary osteoarthritis, unspecified knee: Secondary | ICD-10-CM | POA: Diagnosis not present

## 2012-07-28 DIAGNOSIS — Z7901 Long term (current) use of anticoagulants: Secondary | ICD-10-CM | POA: Diagnosis not present

## 2012-07-28 DIAGNOSIS — I4891 Unspecified atrial fibrillation: Secondary | ICD-10-CM | POA: Diagnosis not present

## 2012-08-18 DIAGNOSIS — I4891 Unspecified atrial fibrillation: Secondary | ICD-10-CM | POA: Diagnosis not present

## 2012-08-18 DIAGNOSIS — Z7901 Long term (current) use of anticoagulants: Secondary | ICD-10-CM | POA: Diagnosis not present

## 2012-08-19 ENCOUNTER — Ambulatory Visit (INDEPENDENT_AMBULATORY_CARE_PROVIDER_SITE_OTHER): Payer: Medicare Other | Admitting: Family Medicine

## 2012-08-19 VITALS — BP 122/80 | HR 90 | Temp 98.2°F | Ht 66.0 in | Wt 206.0 lb

## 2012-08-19 DIAGNOSIS — J9801 Acute bronchospasm: Secondary | ICD-10-CM

## 2012-08-19 DIAGNOSIS — E119 Type 2 diabetes mellitus without complications: Secondary | ICD-10-CM

## 2012-08-19 LAB — POCT GLYCOSYLATED HEMOGLOBIN (HGB A1C): Hemoglobin A1C: 9.7

## 2012-08-19 MED ORDER — PREDNISONE 20 MG PO TABS: 20.0000 mg | ORAL_TABLET | Freq: Every day | ORAL | Status: DC

## 2012-08-19 MED ORDER — INSULIN GLARGINE 100 UNIT/ML SOLOSTAR PEN: 23.0000 [IU] | PEN_INJECTOR | Freq: Every day | SUBCUTANEOUS | Status: DC

## 2012-08-19 MED ORDER — INSULIN GLARGINE 100 UNIT/ML ~~LOC~~ SOLN: 23.0000 [IU] | Freq: Every day | SUBCUTANEOUS | Status: DC

## 2012-08-19 NOTE — Progress Notes (Signed)
  Subjective:    Patient ID: [name], female    DOB: Nov 29, 1950, 62 y.o.   MRN: [id number]  Asthma Her past medical history is significant for asthma.    1. DM2. A1c is stable at 9.7 today. States compliance with medications. No episodes of hypoglycemia noted. Her fasting CBGs range 120-150s typically.  No polyuria, feet numbness.  2. Asthma symptoms. Worsening cough and wheezing in the past one week. She is using xopenex q 4-5 hours without much overall improvement. No recent steroid use. While in the waiting room, another patient was wearing a scent that seemed to trigger a cough. She dosed herself with 2 puffs of xopenex which seemed to help.   Denies chest pain, fever, sputum, syncope, palpitations, nausea, emesis, rash, edema.   Review of Systems See HPI otherwise negative.  reports that she has never smoked. She has never used smokeless tobacco.     Objective:   Physical Exam  Vitals reviewed. Constitutional: She is oriented to person, place, and time. She appears well-developed and well-nourished. No distress.  Coughing spell initially, which calms after she takes inhaler puffs.  HENT:  Head: Normocephalic and atraumatic.  Mouth/Throat: Oropharynx is clear and moist.  Cardiovascular: Normal rate, regular rhythm and normal heart sounds.   Pulmonary/Chest: Effort normal.  No accessory muscle use.  Prolonged expiration. Trace reduction in air movement. Prolonged expiration.  Air sounds symmetric throughout. No significant wheezing.   Neurological: She is alert and oriented to person, place, and time.  Skin: She is not diaphoretic.        Assessment & Plan:

## 2012-08-19 NOTE — Assessment & Plan Note (Addendum)
One week worsening symptoms, including exposure to perfume scent in lobby today. No respiratory distress. Some evidence of persistent bronchospasm on exam, but no sign of infection. Decided to avoid duoneb in office today, due to her hx of arrhythmia. She has xopenex for home use and in the office seems to help. Will give trial of moderate dose prednisone burst. Patient voices her desire to avoid steroids, which I agree is a strong consideration with her uncontrolled diabetes, but ultimately feel that persistent respiratory symptoms warrant the treatment. She agrees to seek emergency care if worsens or call to f/u in clinic next week if not emergent.

## 2012-08-19 NOTE — Assessment & Plan Note (Addendum)
REmains uncontrolled A1c >9. Fasting CBG not at goal of 80-120. Increase lantus by 3 units. Monitor closely with prednisone burst. She is due for eye exam and has a doctor to get checked. Referral to health coach. F/u in 2-3 months.

## 2012-08-19 NOTE — Patient Instructions (Addendum)
Increase your lantus to 23 units.  Your A1c is above goal of 8.0. Make an appointment with health coach. If your breathing doesn't improve, then call for appointment. If you have chest pain, fever, shortness of breath then go to ER.

## 2012-08-22 ENCOUNTER — Telehealth: Payer: Self-pay | Admitting: Critical Care Medicine

## 2012-08-22 MED ORDER — LEVALBUTEROL HCL 0.63 MG/3ML IN NEBU: INHALATION_SOLUTION | RESPIRATORY_TRACT | Status: DC

## 2012-08-22 MED ORDER — LEVALBUTEROL TARTRATE 45 MCG/ACT IN AERO: 1.0000 | INHALATION_SPRAY | RESPIRATORY_TRACT | Status: DC | PRN

## 2012-08-22 NOTE — Telephone Encounter (Signed)
Spoke with pt to verify the msg Rxs were sent to pharm- xopenex HFA and neb sol Nothing further needed per pt

## 2012-08-29 ENCOUNTER — Telehealth: Payer: Self-pay | Admitting: Family Medicine

## 2012-08-29 NOTE — Telephone Encounter (Signed)
Pt would like a referral to the dentist

## 2012-08-30 ENCOUNTER — Encounter: Payer: Self-pay | Admitting: *Deleted

## 2012-08-30 ENCOUNTER — Other Ambulatory Visit: Payer: Self-pay | Admitting: Family Medicine

## 2012-08-30 DIAGNOSIS — K0889 Other specified disorders of teeth and supporting structures: Secondary | ICD-10-CM

## 2012-08-30 NOTE — Telephone Encounter (Signed)
If this patient has medicare, then there's no need for me to refer to a dentist, right?

## 2012-08-30 NOTE — Telephone Encounter (Signed)
Order placed

## 2012-08-30 NOTE — Telephone Encounter (Signed)
Looks like she has the orange card for Dental Only, so she will need a referral.  [name]

## 2012-09-07 ENCOUNTER — Ambulatory Visit (INDEPENDENT_AMBULATORY_CARE_PROVIDER_SITE_OTHER): Payer: Medicare Other | Admitting: Adult Health

## 2012-09-07 ENCOUNTER — Ambulatory Visit: Payer: Medicare Other | Admitting: Pulmonary Disease

## 2012-09-07 ENCOUNTER — Encounter: Payer: Self-pay | Admitting: Adult Health

## 2012-09-07 ENCOUNTER — Telehealth: Payer: Self-pay | Admitting: Critical Care Medicine

## 2012-09-07 VITALS — BP 104/80 | HR 85 | Temp 98.0°F | Ht 67.0 in | Wt 205.6 lb

## 2012-09-07 DIAGNOSIS — J45909 Unspecified asthma, uncomplicated: Secondary | ICD-10-CM | POA: Diagnosis not present

## 2012-09-07 MED ORDER — PREDNISONE 10 MG PO TABS: ORAL_TABLET | ORAL | Status: DC

## 2012-09-07 MED ORDER — HYDROCODONE-HOMATROPINE 5-1.5 MG/5ML PO SYRP: 5.0000 mL | ORAL_SOLUTION | Freq: Four times a day (QID) | ORAL | Status: DC | PRN

## 2012-09-07 MED ORDER — CEFDINIR 300 MG PO CAPS: 300.0000 mg | ORAL_CAPSULE | Freq: Two times a day (BID) | ORAL | Status: DC

## 2012-09-07 NOTE — Telephone Encounter (Signed)
I spoke with the pt and she is c/o wheezing, productive cough with clear phlegm, increased SOB all x 4 days. Appt set today with Dr. Craige Cotta at 11:15. Carron Curie, CMA

## 2012-09-07 NOTE — Patient Instructions (Addendum)
Omnicef 300mg  Twice daily  For 7 days  Prednisone taper over next week.  Delsym 2 tsp Twice daily  As needed  Cough  Chlortrimeton 4mg  1 in am and 2 At bedtime  As needed  Throat clearing /drainage  Hydromet 1-2 tsp every 4-6 hr As needed  Cough , may make you sleepy.  Please contact office for sooner follow up if symptoms do not improve or worsen or seek emergency care  follow up Dr. Delford Field  In 4-6 weeks and As needed   Notify coumadin clinic you are on an antibiotic

## 2012-09-07 NOTE — Telephone Encounter (Signed)
Someone was booking an appt in the same slot at the same time so I moved the pt over to TP schedule at 11am. Carron Curie, CMA

## 2012-09-09 NOTE — Progress Notes (Signed)
Subjective:    Patient ID: [name], female    DOB: 02-26-1951, 62 y.o.   MRN: [id number]  HPI 62 y.o.  AAF with known hx of Asthma , VCD, CHF and Atrial fib on chronic coumadin   09/07/12 Acute OV  Complains of prod cough w thick white mucus x 1 week, wheezing, chest tightness, SOB, chills denies any fever or sweats-- using mucinex and breathing treatments w little improvement.  Lots of throat clearing and irritation over last few days No fever, hemoptysis, chest pain , edema.    Past Medical History  Diagnosis Date  . Atrial fibrillation     ablation x 2 [facility], 01/2006, 2011.  on warfarin  . Hyperlipidemia   . Depression   . Anxiety   . Diabetes mellitus   . Anemia   . Arthritis   . GERD (gastroesophageal reflux disease)   . Sleep apnea   . Vocal cord disease   . CHF (congestive heart failure)     Echo 08/08/10 by [facility]. EF 35-45%. LV systolic function moderately reduced. Moderate global hypokinesis of LV.  RV systolic function moderately reduced. Mild MR. Trace AR.  Marland Kitchen History of ventricular tachycardia     History of VT-VT arrest, now has AICD  . Cardiac arrest jan 2012    in hospital for pneumonia when this occured- occured at the hospital  . AICD (automatic cardioverter/defibrillator) present 06/23/2011  . GASTROESOPHAGEAL REFLUX, NO ESOPHAGITIS 06/10/2006    Qualifier: Diagnosis of  By: Abundio Miu    . Asthma     has had multiple hospitalizations for this  . Non-ischemic cardiomyopathy     echo 08/08/10 - EF 35-45% LV and RV systolic function mod reduced  . Limb pain 06/04/2008    LLE, Baker's cyst in popliteal fossa, no DVT     Family History  Problem Relation Age of Onset  . Diabetes Father   . Hypertension Father      History   Social History  . Marital Status: Widowed    Spouse Name: N/A    Number of Children: N/A  . Years of Education: N/A   Occupational History  . Not on file.   Social History Main Topics  . Smoking status:  Never Smoker   . Smokeless tobacco: Never Used  . Alcohol Use: No  . Drug Use: No  . Sexually Active: No   Other Topics Concern  . Not on file   Social History Narrative   Not employed   Exercise- walking 1 hour daily.    Diet- eating healthy diet.      Allergies  Allergen Reactions  . Avelox (Moxifloxacin Hcl In Nacl)     Cardiac arrest per pt  . Simvastatin Other (See Comments)    myalgias  . Ace Inhibitors Cough    Dry cough      Outpatient Prescriptions Prior to Visit  Medication Sig Dispense Refill  . B-D ULTRAFINE III SHORT PEN 31G X 8 MM MISC use as directed  100 each  9  . beclomethasone (QVAR) 40 MCG/ACT inhaler Inhale 2 puffs into the lungs 2 (two) times daily.  1 Inhaler  12  . buPROPion (WELLBUTRIN XL) 150 MG 24 hr tablet take 1 tablet by mouth once daily  30 tablet  8  . digoxin (LANOXIN) 0.125 MG tablet TAKE 1/2 TABLET BY MOUTH DAILY.  15 tablet  3  . dofetilide (TIKOSYN) 250 MCG capsule Take 250 mcg by mouth 2 (  two) times daily.      . fluticasone (FLONASE) 50 MCG/ACT nasal spray Spray 1 spray into both nostrils twice a day.  16 g  5  . glipiZIDE (GLUCOTROL) 5 MG tablet take 1 tablet by mouth twice a day before meals  120 tablet  3  . Insulin Glargine (LANTUS SOLOSTAR) 100 UNIT/ML SOPN Inject 23 Units into the skin at bedtime.  15 mL  11  . levalbuterol (XOPENEX HFA) 45 MCG/ACT inhaler Inhale 1-2 puffs into the lungs every 4 (four) hours as needed for wheezing.  1 Inhaler  0  . levalbuterol (XOPENEX) 0.63 MG/3ML nebulizer solution one in nebulizer four times daily as needed per Dr Delford Field  360 mL  4  . losartan (COZAAR) 50 MG tablet Take 1 tablet (50 mg total) by mouth daily.  90 tablet  3  . metoprolol (LOPRESSOR) 50 MG tablet take 1 tablet by mouth twice a day  60 tablet  11  . NEXIUM 40 MG capsule take 1 capsule by mouth once daily  30 each  4  . polyethylene glycol powder (GLYCOLAX/MIRALAX) powder take 17GM (DISSOLVED IN WATER) by mouth once daily  255 g  5   . RA LORATADINE 10 MG tablet take 1 tablet by mouth once daily  30 tablet  5  . spironolactone (ALDACTONE) 25 MG tablet take 1/2 tablet by mouth once daily  30 tablet  6  . torsemide (DEMADEX) 10 MG tablet take 2 tablets by mouth once daily  60 tablet  5  . warfarin (COUMADIN) 5 MG tablet Take 5 mg by mouth daily. As directed. 1.5 tabs 2 days per week. 1 tab 5 days per week      . predniSONE (DELTASONE) 20 MG tablet Take 1-2 tablets (20-40 mg total) by mouth daily.  10 tablet  0   No facility-administered medications prior to visit.      Review of Systems  Constitutional:   No  weight loss, night sweats,  Fevers, chills, fatigue, lassitude. HEENT:   No headaches,  Difficulty swallowing,  Tooth/dental problems,  Sore throat,                No sneezing, itching, ear ache, nasal congestion, + post nasal drip,   CV:  No chest pain,  Orthopnea, PND, swelling in lower extremities, anasarca, dizziness, palpitations  GI  No heartburn, indigestion, abdominal pain, nausea, vomiting, diarrhea, change in bowel habits, loss of appetite  Resp:   No coughing up of blood. Marland Kitchen  No chest wall deformity  Skin: no rash or lesions.  GU: no dysuria, change in color of urine, no urgency or frequency.  No flank pain.  MS:  No joint pain or swelling.  No decreased range of motion.  No back pain.  Psych:  No change in mood or affect. No depression or anxiety.  No memory loss.     Objective:   Physical Exam    Filed Vitals:   09/07/12 1130  BP: 104/80  Pulse: 85  Temp: 98 F (36.7 C)  TempSrc: Oral  Height: 5\' 7"  (1.702 m)  Weight: 205 lb 9.6 oz (93.26 kg)  SpO2: 100%    Gen: Pleasant, well-nourished, in no distress,  normal affect  ENT: No lesions,  mouth clear,  oropharynx clear, no postnasal drip  Neck: No JVD, no TMG, no carotid bruits  Lungs:  Faint exp wheezing, good airflow, speaking in full sentences, loud upper airway psuedowheezing  Cardiovascular: RRR, heart sounds normal,  no murmur or gallops, no peripheral edema  Abdomen: soft and NT, no HSM,  BS normal  Musculoskeletal: No deformities, no cyanosis or clubbing  Neuro: alert, non focal  Skin: Warm, no lesions or rashes  No results found.      Assessment & Plan:   No problem-specific assessment & plan notes found for this encounter.   Updated Medication List Outpatient Encounter Prescriptions as of 09/07/2012  Medication Sig Dispense Refill  . B-D ULTRAFINE III SHORT PEN 31G X 8 MM MISC use as directed  100 each  9  . beclomethasone (QVAR) 40 MCG/ACT inhaler Inhale 2 puffs into the lungs 2 (two) times daily.  1 Inhaler  12  . buPROPion (WELLBUTRIN XL) 150 MG 24 hr tablet take 1 tablet by mouth once daily  30 tablet  8  . digoxin (LANOXIN) 0.125 MG tablet TAKE 1/2 TABLET BY MOUTH DAILY.  15 tablet  3  . dofetilide (TIKOSYN) 250 MCG capsule Take 250 mcg by mouth 2 (two) times daily.      . fluticasone (FLONASE) 50 MCG/ACT nasal spray Spray 1 spray into both nostrils twice a day.  16 g  5  . glipiZIDE (GLUCOTROL) 5 MG tablet take 1 tablet by mouth twice a day before meals  120 tablet  3  . Insulin Glargine (LANTUS SOLOSTAR) 100 UNIT/ML SOPN Inject 23 Units into the skin at bedtime.  15 mL  11  . levalbuterol (XOPENEX HFA) 45 MCG/ACT inhaler Inhale 1-2 puffs into the lungs every 4 (four) hours as needed for wheezing.  1 Inhaler  0  . levalbuterol (XOPENEX) 0.63 MG/3ML nebulizer solution one in nebulizer four times daily as needed per Dr Delford Field  360 mL  4  . losartan (COZAAR) 50 MG tablet Take 1 tablet (50 mg total) by mouth daily.  90 tablet  3  . metoprolol (LOPRESSOR) 50 MG tablet take 1 tablet by mouth twice a day  60 tablet  11  . NEXIUM 40 MG capsule take 1 capsule by mouth once daily  30 each  4  . polyethylene glycol powder (GLYCOLAX/MIRALAX) powder take 17GM (DISSOLVED IN WATER) by mouth once daily  255 g  5  . RA LORATADINE 10 MG tablet take 1 tablet by mouth once daily  30 tablet  5  .  spironolactone (ALDACTONE) 25 MG tablet take 1/2 tablet by mouth once daily  30 tablet  6  . torsemide (DEMADEX) 10 MG tablet take 2 tablets by mouth once daily  60 tablet  5  . warfarin (COUMADIN) 5 MG tablet Take 5 mg by mouth daily. As directed. 1.5 tabs 2 days per week. 1 tab 5 days per week      . cefdinir (OMNICEF) 300 MG capsule Take 1 capsule (300 mg total) by mouth 2 (two) times daily.  14 capsule  0  . HYDROcodone-homatropine (HYDROMET) 5-1.5 MG/5ML syrup Take 5 mLs by mouth every 6 (six) hours as needed for cough.  240 mL  0  . predniSONE (DELTASONE) 10 MG tablet 4 tabs for 2 days, then 3 tabs for 2 days, 2 tabs for 2 days, then 1 tab for 2 days, then stop  20 tablet  0  . [DISCONTINUED] predniSONE (DELTASONE) 20 MG tablet Take 1-2 tablets (20-40 mg total) by mouth daily.  10 tablet  0   No facility-administered encounter medications on file as of 09/07/2012.

## 2012-09-11 ENCOUNTER — Other Ambulatory Visit: Payer: Self-pay | Admitting: Critical Care Medicine

## 2012-09-12 ENCOUNTER — Telehealth: Payer: Self-pay | Admitting: Pharmacist Clinician (PhC)/ Clinical Pharmacy Specialist

## 2012-09-12 NOTE — Telephone Encounter (Signed)
Pt called to let us know she has had some breathing problems last week and was started on prednisone and omnicef 300mg .  She is scheduled for an INR check this Thursday, but was wondering if she needed to be seen sooner.   I asked her to skip her warfarin dose today then resume regular dose and keep her appointment this Thursday.  Pt voiced understanding.

## 2012-09-12 NOTE — Assessment & Plan Note (Signed)
Exacerbation w/ VCD decompensation   Plan  Omnicef 300mg  Twice daily  For 7 days  Prednisone taper over next week.  Delsym 2 tsp Twice daily  As needed  Cough  Chlortrimeton 4mg  1 in am and 2 At bedtime  As needed  Throat clearing /drainage  Hydromet 1-2 tsp every 4-6 hr As needed  Cough , may make you sleepy.  Please contact office for sooner follow up if symptoms do not improve or worsen or seek emergency care  follow up Dr. Delford Field  In 4-6 weeks and As needed   Notify coumadin clinic you are on an antibiotic

## 2012-09-13 ENCOUNTER — Telehealth: Payer: Self-pay | Admitting: Critical Care Medicine

## 2012-09-13 MED ORDER — CEFDINIR 300 MG PO CAPS: 300.0000 mg | ORAL_CAPSULE | Freq: Two times a day (BID) | ORAL | Status: DC

## 2012-09-13 NOTE — Telephone Encounter (Signed)
Spoke with pt and notified of recs per TP  She verbalized understanding and denied any questions Nothing further needed  Rx was sent to pharm and pt tcb if not improving for ov

## 2012-09-13 NOTE — Telephone Encounter (Signed)
Extend Omnicef 300mg  Twice daily  X 3d  (#6)  Cont w/ ov recs  If not improving will need ov  Please contact office for sooner follow up if symptoms do not improve or worsen or seek emergency care

## 2012-09-13 NOTE — Telephone Encounter (Signed)
Last visit on 09-07-12 with TP. Pt was given omnicef 300mg  bid x 7 days, and pred taper. Today is the last dose of abx and prednisone and pt states that she does not feel any improvement. She is c/o productive cough with white phlegm that is some worse, wheezing, chest tightness, SOB and voice loss. Pt states she is taking delsym, hydromet, and chlortrimeton as directed without relief. Please advise. Carron Curie, CMA Allergies  Allergen Reactions  . Avelox (Moxifloxacin Hcl In Nacl)     Cardiac arrest per pt  . Simvastatin Other (See Comments)    myalgias  . Ace Inhibitors Cough    Dry cough

## 2012-09-15 ENCOUNTER — Ambulatory Visit (INDEPENDENT_AMBULATORY_CARE_PROVIDER_SITE_OTHER): Payer: Medicare Other | Admitting: Pharmacist Clinician (PhC)/ Clinical Pharmacy Specialist

## 2012-09-15 VITALS — BP 122/80 | HR 68

## 2012-09-15 DIAGNOSIS — Z7901 Long term (current) use of anticoagulants: Secondary | ICD-10-CM

## 2012-09-15 DIAGNOSIS — I4891 Unspecified atrial fibrillation: Secondary | ICD-10-CM

## 2012-09-15 LAB — POCT INR: INR: 1.4

## 2012-09-20 ENCOUNTER — Other Ambulatory Visit: Payer: Self-pay | Admitting: Family Medicine

## 2012-10-07 ENCOUNTER — Ambulatory Visit (INDEPENDENT_AMBULATORY_CARE_PROVIDER_SITE_OTHER): Payer: Medicare Other | Admitting: Pharmacist Clinician (PhC)/ Clinical Pharmacy Specialist

## 2012-10-07 VITALS — BP 132/70 | HR 76

## 2012-10-07 DIAGNOSIS — I4891 Unspecified atrial fibrillation: Secondary | ICD-10-CM

## 2012-10-07 DIAGNOSIS — Z7901 Long term (current) use of anticoagulants: Secondary | ICD-10-CM

## 2012-10-17 ENCOUNTER — Ambulatory Visit (INDEPENDENT_AMBULATORY_CARE_PROVIDER_SITE_OTHER): Payer: Medicare Other | Admitting: Critical Care Medicine

## 2012-10-17 ENCOUNTER — Encounter: Payer: Self-pay | Admitting: Critical Care Medicine

## 2012-10-17 VITALS — BP 116/80 | HR 71 | Temp 98.1°F | Ht 67.0 in | Wt 207.6 lb

## 2012-10-17 DIAGNOSIS — J45909 Unspecified asthma, uncomplicated: Secondary | ICD-10-CM

## 2012-10-17 MED ORDER — FLUTICASONE PROPIONATE 50 MCG/ACT NA SUSP: NASAL | Status: DC

## 2012-10-17 MED ORDER — CHLORPHENIRAMINE MALEATE 4 MG PO TABS: ORAL_TABLET | ORAL | Status: DC

## 2012-10-17 NOTE — Assessment & Plan Note (Signed)
Moderate persistent asthma with recent exacerbation now resolved Plan Use chlortrimeton 8mg  at bedtime Increase flonase two puff each nostril twice daily No other medication changes Return 2 months

## 2012-10-17 NOTE — Patient Instructions (Addendum)
Use chlortrimeton 8mg  at bedtime Increase flonase two puff each nostril twice daily No other medication changes Return 2 months

## 2012-10-17 NOTE — Progress Notes (Signed)
Subjective:    Patient ID: [name], female    DOB: 03-13-1951, 62 y.o.   MRN: [id number]  HPI 62 y.o.  AAF with known hx of Asthma , VCD, CHF and Atrial fib on chronic coumadin      10/17/2012 Chief Complaint  Patient presents with  . 6 wk follow up    feels congestion in thraot, drainange, and occas nonprod cough.  No increased SOB, wheezing, chest tightness, pr chest pain.    Pt improved.    Past Medical History  Diagnosis Date  . Atrial fibrillation     ablation x 2 [facility], 01/2006, 2011.  on warfarin  . Hyperlipidemia   . Depression   . Anxiety   . Diabetes mellitus   . Anemia   . Arthritis   . GERD (gastroesophageal reflux disease)   . Sleep apnea   . Vocal cord disease   . CHF (congestive heart failure)     Echo 08/08/10 by [facility]. EF 35-45%. LV systolic function moderately reduced. Moderate global hypokinesis of LV.  RV systolic function moderately reduced. Mild MR. Trace AR.  Marland Kitchen History of ventricular tachycardia     History of VT-VT arrest, now has AICD  . Cardiac arrest jan 2012    in hospital for pneumonia when this occured- occured at the hospital  . AICD (automatic cardioverter/defibrillator) present 06/23/2011  . GASTROESOPHAGEAL REFLUX, NO ESOPHAGITIS 06/10/2006    Qualifier: Diagnosis of  By: Abundio Miu    . Asthma     has had multiple hospitalizations for this  . Non-ischemic cardiomyopathy     echo 08/08/10 - EF 35-45% LV and RV systolic function mod reduced  . Limb pain 06/04/2008    LLE, Baker's cyst in popliteal fossa, no DVT     Family History  Problem Relation Age of Onset  . Diabetes Father   . Hypertension Father      History   Social History  . Marital Status: Widowed    Spouse Name: N/A    Number of Children: N/A  . Years of Education: N/A   Occupational History  . Not on file.   Social History Main Topics  . Smoking status: Never Smoker   . Smokeless tobacco: Never Used  . Alcohol Use: No  . Drug Use: No  .  Sexually Active: No   Other Topics Concern  . Not on file   Social History Narrative   Not employed   Exercise- walking 1 hour daily.    Diet- eating healthy diet.      Allergies  Allergen Reactions  . Avelox (Moxifloxacin Hcl In Nacl)     Cardiac arrest per pt  . Simvastatin Other (See Comments)    myalgias  . Ace Inhibitors Cough    Dry cough      Outpatient Prescriptions Prior to Visit  Medication Sig Dispense Refill  . B-D ULTRAFINE III SHORT PEN 31G X 8 MM MISC use as directed  100 each  9  . beclomethasone (QVAR) 40 MCG/ACT inhaler Inhale 2 puffs into the lungs 2 (two) times daily.  1 Inhaler  12  . buPROPion (WELLBUTRIN XL) 150 MG 24 hr tablet take 1 tablet by mouth once daily  30 tablet  8  . digoxin (LANOXIN) 0.125 MG tablet TAKE 1/2 TABLET BY MOUTH DAILY.  15 tablet  3  . dofetilide (TIKOSYN) 250 MCG capsule Take 250 mcg by mouth 2 (two) times daily.      Marland Kitchen  glipiZIDE (GLUCOTROL) 5 MG tablet take 1 tablet by mouth twice a day before meals  120 tablet  3  . Insulin Glargine (LANTUS SOLOSTAR) 100 UNIT/ML SOPN Inject 23 Units into the skin at bedtime.  15 mL  11  . levalbuterol (XOPENEX HFA) 45 MCG/ACT inhaler Inhale 1-2 puffs into the lungs every 4 (four) hours as needed for wheezing.  1 Inhaler  0  . levalbuterol (XOPENEX) 0.63 MG/3ML nebulizer solution one in nebulizer four times daily as needed per Dr Delford Field  360 mL  4  . losartan (COZAAR) 50 MG tablet Take 1 tablet (50 mg total) by mouth daily.  90 tablet  3  . metoprolol (LOPRESSOR) 50 MG tablet take 1 tablet by mouth twice a day  60 tablet  11  . NEXIUM 40 MG capsule take 1 capsule by mouth once daily  30 capsule  4  . polyethylene glycol powder (GLYCOLAX/MIRALAX) powder take 17GM (DISSOLVED IN WATER) by mouth once daily  255 g  5  . RA LORATADINE 10 MG tablet take 1 tablet by mouth once daily  30 tablet  5  . spironolactone (ALDACTONE) 25 MG tablet take 1/2 tablet by mouth once daily  30 tablet  6  . torsemide  (DEMADEX) 10 MG tablet take 2 tablets by mouth once daily  60 tablet  5  . warfarin (COUMADIN) 5 MG tablet Take 5 mg by mouth daily. As directed. 1.5 tabs 2 days per week. 1 tab 5 days per week      . fluticasone (FLONASE) 50 MCG/ACT nasal spray Spray 1 spray into both nostrils twice a day.  16 g  5  . HYDROcodone-homatropine (HYDROMET) 5-1.5 MG/5ML syrup Take 5 mLs by mouth every 6 (six) hours as needed for cough.  240 mL  0  . cefdinir (OMNICEF) 300 MG capsule Take 1 capsule (300 mg total) by mouth 2 (two) times daily.  14 capsule  0  . cefdinir (OMNICEF) 300 MG capsule Take 1 capsule (300 mg total) by mouth 2 (two) times daily.  6 capsule  0  . predniSONE (DELTASONE) 10 MG tablet 4 tabs for 2 days, then 3 tabs for 2 days, 2 tabs for 2 days, then 1 tab for 2 days, then stop  20 tablet  0   No facility-administered medications prior to visit.      Review of Systems  Constitutional:   No  weight loss, night sweats,  Fevers, chills, fatigue, lassitude. HEENT:   No headaches,  Difficulty swallowing,  Tooth/dental problems,  Sore throat,                No sneezing, itching, ear ache, nasal congestion, + post nasal drip,   CV:  No chest pain,  Orthopnea, PND, swelling in lower extremities, anasarca, dizziness, palpitations  GI  No heartburn, indigestion, abdominal pain, nausea, vomiting, diarrhea, change in bowel habits, loss of appetite  Resp:   No coughing up of blood. Marland Kitchen  No chest wall deformity  Skin: no rash or lesions.  GU: no dysuria, change in color of urine, no urgency or frequency.  No flank pain.  MS:  No joint pain or swelling.  No decreased range of motion.  No back pain.  Psych:  No change in mood or affect. No depression or anxiety.  No memory loss.     Objective:   Physical Exam    Filed Vitals:   10/17/12 1514  BP: 116/80  Pulse: 71  Temp: 98.1 F (  36.7 C)  TempSrc: Oral  Height: 5\' 7"  (1.702 m)  Weight: 207 lb 9.6 oz (94.167 kg)  SpO2: 98%    Gen:  Pleasant, well-nourished, in no distress,  normal affect  ENT: No lesions,  mouth clear,  oropharynx clear, no postnasal drip  Neck: No JVD, no TMG, no carotid bruits  Lungs: no wheezing, good airflow, speaking in full sentences,resolved pseudowheeze  Cardiovascular: RRR, heart sounds normal, no murmur or gallops, no peripheral edema  Abdomen: soft and NT, no HSM,  BS normal  Musculoskeletal: No deformities, no cyanosis or clubbing  Neuro: alert, non focal  Skin: Warm, no lesions or rashes  No results found.      Assessment & Plan:   Moderate persistent asthma with significant atopic features Moderate persistent asthma with recent exacerbation now resolved Plan Use chlortrimeton 8mg  at bedtime Increase flonase two puff each nostril twice daily No other medication changes Return 2 months     Updated Medication List Outpatient Encounter Prescriptions as of 10/17/2012  Medication Sig Dispense Refill  . B-D ULTRAFINE III SHORT PEN 31G X 8 MM MISC use as directed  100 each  9  . beclomethasone (QVAR) 40 MCG/ACT inhaler Inhale 2 puffs into the lungs 2 (two) times daily.  1 Inhaler  12  . buPROPion (WELLBUTRIN XL) 150 MG 24 hr tablet take 1 tablet by mouth once daily  30 tablet  8  . digoxin (LANOXIN) 0.125 MG tablet TAKE 1/2 TABLET BY MOUTH DAILY.  15 tablet  3  . dofetilide (TIKOSYN) 250 MCG capsule Take 250 mcg by mouth 2 (two) times daily.      . fluticasone (FLONASE) 50 MCG/ACT nasal spray Spray 2 spray into both nostrils twice a day for two weeks then one puff twice daily  16 g  5  . glipiZIDE (GLUCOTROL) 5 MG tablet take 1 tablet by mouth twice a day before meals  120 tablet  3  . Insulin Glargine (LANTUS SOLOSTAR) 100 UNIT/ML SOPN Inject 23 Units into the skin at bedtime.  15 mL  11  . levalbuterol (XOPENEX HFA) 45 MCG/ACT inhaler Inhale 1-2 puffs into the lungs every 4 (four) hours as needed for wheezing.  1 Inhaler  0  . levalbuterol (XOPENEX) 0.63 MG/3ML nebulizer  solution one in nebulizer four times daily as needed per Dr Delford Field  360 mL  4  . losartan (COZAAR) 50 MG tablet Take 1 tablet (50 mg total) by mouth daily.  90 tablet  3  . metoprolol (LOPRESSOR) 50 MG tablet take 1 tablet by mouth twice a day  60 tablet  11  . NEXIUM 40 MG capsule take 1 capsule by mouth once daily  30 capsule  4  . polyethylene glycol powder (GLYCOLAX/MIRALAX) powder take 17GM (DISSOLVED IN WATER) by mouth once daily  255 g  5  . RA LORATADINE 10 MG tablet take 1 tablet by mouth once daily  30 tablet  5  . spironolactone (ALDACTONE) 25 MG tablet take 1/2 tablet by mouth once daily  30 tablet  6  . torsemide (DEMADEX) 10 MG tablet take 2 tablets by mouth once daily  60 tablet  5  . warfarin (COUMADIN) 5 MG tablet Take 5 mg by mouth daily. As directed. 1.5 tabs 2 days per week. 1 tab 5 days per week      . [DISCONTINUED] fluticasone (FLONASE) 50 MCG/ACT nasal spray Spray 1 spray into both nostrils twice a day.  16 g  5  . [  DISCONTINUED] HYDROcodone-homatropine (HYDROMET) 5-1.5 MG/5ML syrup Take 5 mLs by mouth every 6 (six) hours as needed for cough.  240 mL  0  . chlorpheniramine (CHLOR-TRIMETON) 4 MG tablet 8mg  at night  14 tablet  0  . [DISCONTINUED] cefdinir (OMNICEF) 300 MG capsule Take 1 capsule (300 mg total) by mouth 2 (two) times daily.  14 capsule  0  . [DISCONTINUED] cefdinir (OMNICEF) 300 MG capsule Take 1 capsule (300 mg total) by mouth 2 (two) times daily.  6 capsule  0  . [DISCONTINUED] predniSONE (DELTASONE) 10 MG tablet 4 tabs for 2 days, then 3 tabs for 2 days, 2 tabs for 2 days, then 1 tab for 2 days, then stop  20 tablet  0   No facility-administered encounter medications on file as of 10/17/2012.

## 2012-10-20 ENCOUNTER — Other Ambulatory Visit: Payer: Self-pay

## 2012-10-23 ENCOUNTER — Other Ambulatory Visit: Payer: Self-pay | Admitting: Cardiovascular Disease

## 2012-10-23 DIAGNOSIS — I4581 Long QT syndrome: Secondary | ICD-10-CM

## 2012-10-23 DIAGNOSIS — I428 Other cardiomyopathies: Secondary | ICD-10-CM

## 2012-10-23 LAB — ICD DEVICE OBSERVATION

## 2012-11-01 ENCOUNTER — Ambulatory Visit (INDEPENDENT_AMBULATORY_CARE_PROVIDER_SITE_OTHER): Payer: Medicare Other | Admitting: Pharmacist Clinician (PhC)/ Clinical Pharmacy Specialist

## 2012-11-01 VITALS — BP 120/84 | HR 68

## 2012-11-01 DIAGNOSIS — I4891 Unspecified atrial fibrillation: Secondary | ICD-10-CM | POA: Diagnosis not present

## 2012-11-01 DIAGNOSIS — Z7901 Long term (current) use of anticoagulants: Secondary | ICD-10-CM

## 2012-11-03 ENCOUNTER — Other Ambulatory Visit: Payer: Self-pay | Admitting: *Deleted

## 2012-11-08 ENCOUNTER — Other Ambulatory Visit: Payer: Self-pay | Admitting: Family Medicine

## 2012-11-08 ENCOUNTER — Other Ambulatory Visit: Payer: Self-pay | Admitting: Pharmacist

## 2012-11-08 MED ORDER — WARFARIN SODIUM 5 MG PO TABS: 5.0000 mg | ORAL_TABLET | Freq: Every day | ORAL | Status: DC

## 2012-11-17 ENCOUNTER — Encounter: Payer: Self-pay | Admitting: Cardiovascular Disease

## 2012-11-17 ENCOUNTER — Ambulatory Visit (INDEPENDENT_AMBULATORY_CARE_PROVIDER_SITE_OTHER): Payer: Medicare Other | Admitting: Cardiovascular Disease

## 2012-11-17 VITALS — BP 122/78 | HR 83 | Ht 67.0 in | Wt 206.8 lb

## 2012-11-17 DIAGNOSIS — N182 Chronic kidney disease, stage 2 (mild): Secondary | ICD-10-CM

## 2012-11-17 DIAGNOSIS — I4891 Unspecified atrial fibrillation: Secondary | ICD-10-CM

## 2012-11-17 DIAGNOSIS — I10 Essential (primary) hypertension: Secondary | ICD-10-CM | POA: Diagnosis not present

## 2012-11-17 DIAGNOSIS — I5022 Chronic systolic (congestive) heart failure: Secondary | ICD-10-CM | POA: Diagnosis not present

## 2012-11-17 DIAGNOSIS — E119 Type 2 diabetes mellitus without complications: Secondary | ICD-10-CM

## 2012-11-17 DIAGNOSIS — G4730 Sleep apnea, unspecified: Secondary | ICD-10-CM

## 2012-11-17 DIAGNOSIS — E7800 Pure hypercholesterolemia, unspecified: Secondary | ICD-10-CM

## 2012-11-17 DIAGNOSIS — Z95810 Presence of automatic (implantable) cardiac defibrillator: Secondary | ICD-10-CM

## 2012-11-17 MED ORDER — LIDOCAINE 5 % EX PTCH: 1.0000 | MEDICATED_PATCH | CUTANEOUS | Status: DC

## 2012-11-17 NOTE — Patient Instructions (Addendum)
Your physician recommends that you schedule a follow-up appointment in: 6 months  

## 2012-11-18 ENCOUNTER — Other Ambulatory Visit: Payer: Self-pay | Admitting: Family Medicine

## 2012-11-19 DIAGNOSIS — Z95810 Presence of automatic (implantable) cardiac defibrillator: Secondary | ICD-10-CM | POA: Insufficient documentation

## 2012-11-19 HISTORY — DX: Presence of automatic (implantable) cardiac defibrillator: Z95.810

## 2012-11-19 NOTE — Assessment & Plan Note (Addendum)
Status post initial 2012 radiofrequency ablation with recurrence of atrial fibrillation and repeat ablation in 2013. Now on Tikosyn, metoprolol and digoxin with occasional brief and asymptomatic recurrences of paroxysmal atrial fibrillation lasting up to 3 hours. She has no awareness of the arrhythmia. Overall arrhythmia burden is a low (less than 1%). She should remain on lifelong anticoagulation. During episodes of atrial fibrillation rate control is acceptable. QTc interval is 467 ms, within the desirable range.

## 2012-11-19 NOTE — Assessment & Plan Note (Signed)
Good control

## 2012-11-19 NOTE — Assessment & Plan Note (Signed)
She is well compensated heart failure. Her optimal is staying in the normal range. NYHA functional class II. Note objective evidence of increased activity level as monitored by her defibrillator sensor. She is active roughly 4 hours a day which is a marked improvement compared to last year. She appears to be clinically euvolemic. She is on chronic loop diuretic therapy, as well as appropriate angiotensin receptor blocker and beta blocker therapy and Aldactone.

## 2012-11-19 NOTE — Assessment & Plan Note (Signed)
Monitored remotely via the [facility] system. Normal device function. No discharges. Records recurrent episodes of asymptomatic paroxysmal atrial fibrillation. Atrial pacing roughly 97%, very rare ventricular pacing.

## 2012-11-19 NOTE — Assessment & Plan Note (Signed)
Compliance with CPAP is mediocre. Reviewed need for improved use of the device.

## 2012-11-19 NOTE — Assessment & Plan Note (Signed)
Poor glycemic control. Most recent hemoglobin A1c was 9.4%. She is physically active and the problem has to be with her diet. We discussed the importance of weight loss, avoiding sugars and starches with high glycemic index and increasing her intake of unsaturated fat and protein.

## 2012-11-19 NOTE — Progress Notes (Signed)
Patient ID: [name], female   DOB: Mar 21, 1951, 62 y.o.   MRN: [id number]     Reason for office visit Nonischemic cardiomyopathy, systolic heart failure, paroxysmal atrial fibrillation, history of ventricular fibrillation arrest  [name] returns for routine followup. She has a history of moderate nonischemic cardiomyopathy (no coronary disease by cardiac catheterization January 2012),  Atrial fibrillation status post 2 ablation procedures (Dr. Orson Aloe), with history of ventricular fibrillation arrest while on treatment with diltiazem and dofetilide and a prolonged QT interval, probably related to simultaneous quinolone therapy.  Has no complaints. She has frequent than relatively brief episodes of paroxysmal atrial fibrillation that are asymptomatic. She is on anticoagulation therapy. She has not had any focal neurological problems or bleeding complications.  She has not had any episodes of overt heart failure decompensation since her last appointment. She is much more active than she was a year ago. Despite this she remains obese and has problems with glycemic regulation. She only uses the CPAP sporadically and generally no more than 2 hours a night.    Allergies  Allergen Reactions  . Avelox (Moxifloxacin Hcl In Nacl)     Cardiac arrest per pt  . Simvastatin Other (See Comments)    myalgias  . Ace Inhibitors Cough    Dry cough     Current Outpatient Prescriptions  Medication Sig Dispense Refill  . B-D ULTRAFINE III SHORT PEN 31G X 8 MM MISC use as directed  100 each  9  . beclomethasone (QVAR) 40 MCG/ACT inhaler Inhale 2 puffs into the lungs 2 (two) times daily.  1 Inhaler  12  . buPROPion (WELLBUTRIN XL) 150 MG 24 hr tablet take 1 tablet by mouth once daily  30 tablet  8  . chlorpheniramine (CHLOR-TRIMETON) 4 MG tablet 8mg  at night  14 tablet  0  . digoxin (LANOXIN) 0.125 MG tablet TAKE 1/2 TABLET BY MOUTH DAILY.  15 tablet  3  . dofetilide (TIKOSYN) 250 MCG capsule Take  250 mcg by mouth 2 (two) times daily.      . fluticasone (FLONASE) 50 MCG/ACT nasal spray Spray 2 spray into both nostrils twice a day for two weeks then one puff twice daily  16 g  5  . glipiZIDE (GLUCOTROL) 5 MG tablet take 1 tablet by mouth twice a day before meals  120 tablet  3  . Insulin Glargine (LANTUS SOLOSTAR) 100 UNIT/ML SOPN Inject 23 Units into the skin at bedtime.  15 mL  11  . levalbuterol (XOPENEX HFA) 45 MCG/ACT inhaler Inhale 1-2 puffs into the lungs every 4 (four) hours as needed for wheezing.  1 Inhaler  0  . levalbuterol (XOPENEX) 0.63 MG/3ML nebulizer solution one in nebulizer four times daily as needed per Dr Delford Field  360 mL  4  . losartan (COZAAR) 50 MG tablet Take 1 tablet (50 mg total) by mouth daily.  90 tablet  3  . metoprolol (LOPRESSOR) 50 MG tablet take 1 tablet by mouth twice a day  60 tablet  11  . NEXIUM 40 MG capsule take 1 capsule by mouth once daily  30 capsule  4  . polyethylene glycol powder (GLYCOLAX/MIRALAX) powder take 17GM (DISSOLVED IN WATER) by mouth once daily  255 g  5  . RA LORATADINE 10 MG tablet take 1 tablet by mouth once daily  30 tablet  5  . spironolactone (ALDACTONE) 25 MG tablet take 1/2 tablet by mouth once daily  30 tablet  6  . torsemide (DEMADEX)  10 MG tablet take 2 tablets by mouth once daily  60 tablet  5  . warfarin (COUMADIN) 5 MG tablet Take 1 tablet (5 mg total) by mouth daily. As directed. 1.5 tabs 2 days per week. 1 tab 5 days per week  45 tablet  3  . lidocaine (LIDODERM) 5 % Place 1 patch onto the skin daily. Remove & Discard patch within 12 hours or as directed by MD  30 patch  0   No current facility-administered medications for this visit.    Past Medical History  Diagnosis Date  . Atrial fibrillation     ablation x 2 [facility], 01/2006, 2011.  on warfarin  . Hyperlipidemia   . Depression   . Anxiety   . Diabetes mellitus   . Anemia   . Arthritis   . GERD (gastroesophageal reflux disease)   . Sleep apnea   . Vocal cord  disease   . CHF (congestive heart failure)     Echo 08/08/10 by [facility]. EF 35-45%. LV systolic function moderately reduced. Moderate global hypokinesis of LV.  RV systolic function moderately reduced. Mild MR. Trace AR.  Marland Kitchen History of ventricular tachycardia     History of VT-VT arrest, now has AICD  . Cardiac arrest jan 2012    in hospital for pneumonia when this occured- occured at the hospital  . AICD (automatic cardioverter/defibrillator) present 06/23/2011  . GASTROESOPHAGEAL REFLUX, NO ESOPHAGITIS 06/10/2006    Qualifier: Diagnosis of  By: Abundio Miu    . Asthma     has had multiple hospitalizations for this  . Non-ischemic cardiomyopathy     echo 08/08/10 - EF 35-45% LV and RV systolic function mod reduced  . Limb pain 06/04/2008    LLE, Baker's cyst in popliteal fossa, no DVT    Past Surgical History  Procedure Laterality Date  . Pulomary vein isolation    . Cholecystectomy    . Knee arthroscopy    . Cardiac defibrillator placement  05/02/10    Medtronic Protecta XT-DR for CHF-VT, last download 04/12/12  . Paf ablation      By Dr Sampson Goon. Now sees Dr Steele Berg at [facility]  . Atrial cardiac pacemaker insertion      Family History  Problem Relation Age of Onset  . Diabetes Father   . Hypertension Father     History   Social History  . Marital Status: Widowed    Spouse Name: N/A    Number of Children: N/A  . Years of Education: N/A   Occupational History  . Not on file.   Social History Main Topics  . Smoking status: Never Smoker   . Smokeless tobacco: Never Used  . Alcohol Use: No  . Drug Use: No  . Sexually Active: No   Other Topics Concern  . Not on file   Social History Narrative   Not employed   Exercise- walking 1 hour daily.    Diet- eating healthy diet.     Review of systems: The patient specifically denies any chest pain at rest or with exertion, dyspnea at rest or with exertion, orthopnea, paroxysmal nocturnal dyspnea,  syncope, palpitations, focal neurological deficits, intermittent claudication, lower extremity edema, unexplained weight gain, cough, hemoptysis or wheezing.  The patient also denies abdominal pain, nausea, vomiting, dysphagia, diarrhea, constipation, polyuria, polydipsia, dysuria, hematuria, frequency, urgency, abnormal bleeding or bruising, fever, chills, unexpected weight changes, mood swings, change in skin or hair texture, change in voice quality, auditory or visual  problems, allergic reactions or rashes, new musculoskeletal complaints other than usual "aches and pains".   PHYSICAL EXAM BP 122/78  Pulse 83  Ht 5\' 7"  (1.702 m)  Wt 206 lb 12.8 oz (93.804 kg)  BMI 32.38 kg/m2  LMP 07/26/2011  General: Alert, oriented x3, no distress, mildly obese Head: no evidence of trauma, PERRL, EOMI, no exophtalmos or lid lag, no myxedema, no xanthelasma; normal ears, nose and oropharynx Neck: normal jugular venous pulsations and no hepatojugular reflux; brisk carotid pulses without delay and no carotid bruits Chest: clear to auscultation, no signs of consolidation by percussion or palpation, normal fremitus, symmetrical and full respiratory excursions, healthy appearance of the ICD site Cardiovascular: normal position and quality of the apical impulse, regular rhythm, normal first and second heart sounds, no murmurs, rubs or gallops Abdomen: no tenderness or distention, no masses by palpation, no abnormal pulsatility or arterial bruits, normal bowel sounds, no hepatosplenomegaly Extremities: no clubbing, cyanosis or edema; 2+ radial, ulnar and brachial pulses bilaterally; 2+ right femoral, posterior tibial and dorsalis pedis pulses; 2+ left femoral, posterior tibial and dorsalis pedis pulses; no subclavian or femoral bruits Neurological: grossly nonfocal   EKG: Atrial paced ventricular sensed, mildly prolonged QT interval at 467 ms, consistent with dofetilide effect, no ischemic changes.  Lipid Panel       Component Value Date/Time   CHOL 158 01/13/2011 0852   TRIG 70 01/13/2011 0852   HDL 51 01/13/2011 0852   CHOLHDL 3.1 01/13/2011 0852   VLDL 14 01/13/2011 0852   LDLCALC 93 01/13/2011 0852    BMET    Component Value Date/Time   NA 136 05/16/2012 0902   K 4.4 05/16/2012 0902   CL 99 05/16/2012 0902   CO2 29 05/16/2012 0902   GLUCOSE 248* 05/16/2012 0902   BUN 15 05/16/2012 0902   CREATININE 1.03 05/16/2012 0902   CREATININE 1.07 07/26/2011 1123   CALCIUM 9.2 05/16/2012 0902   GFRNONAA 55* 07/26/2011 1123   GFRAA 64* 07/26/2011 1123     ASSESSMENT AND PLAN Atrial fibrillation Status post initial 2012 radiofrequency ablation with recurrence of atrial fibrillation and repeat ablation in 2013. Now on Tikosyn, metoprolol and digoxin with occasional brief and asymptomatic recurrences of paroxysmal atrial fibrillation lasting up to 3 hours. She has no awareness of the arrhythmia. Overall arrhythmia burden is a low (less than 1%). She should remain on lifelong anticoagulation. During episodes of atrial fibrillation rate control is acceptable. QTc interval is 467 ms, within the desirable range.  Chronic systolic heart failure She is well compensated heart failure. Her optimal is staying in the normal range. NYHA functional class II. Note objective evidence of increased activity level as monitored by her defibrillator sensor. She is active roughly 4 hours a day which is a marked improvement compared to last year. She appears to be clinically euvolemic. She is on chronic loop diuretic therapy, as well as appropriate angiotensin receptor blocker and beta blocker therapy and Aldactone.  APNEA, SLEEP Compliance with CPAP is mediocre. Reviewed need for improved use of the device.  HYPERTENSION, BENIGN SYSTEMIC Good control  DIABETES MELLITUS II, UNCOMPLICATED Poor glycemic control. Most recent hemoglobin A1c was 9.4%. She is physically active and the problem has to be with her diet. We discussed the importance  of weight loss, avoiding sugars and starches with high glycemic index and increasing her intake of unsaturated fat and protein.  CKD (chronic kidney disease) stage 2, GFR 60-89 ml/min    ICD (St. Jude Protecta dual-chamber),secondary  prevention (VF arrest) January 2012 Monitored remotely via the [facility] system. Normal device function. No discharges. Records recurrent episodes of asymptomatic paroxysmal atrial fibrillation. Atrial pacing roughly 97%, very rare ventricular pacing.  HYPERCHOLESTEROLEMIA Satisfactory lipid profile. She has diabetes but no known vascular or coronary problems.   Orders Placed This Encounter  Procedures  . EKG 12-Lead   Meds ordered this encounter  Medications  . lidocaine (LIDODERM) 5 %    Sig: Place 1 patch onto the skin daily. Remove & Discard patch within 12 hours or as directed by MD    Dispense:  30 patch    Refill:  0    Bascom Biel  Thurmon Fair, MD, [facility] [phone] office [phone] pager

## 2012-11-19 NOTE — Assessment & Plan Note (Signed)
Satisfactory lipid profile. She has diabetes but no known vascular or coronary problems.

## 2012-11-20 IMAGING — CT CT CHEST W/ CM
2 of 4 series · 15 of 36 positions shown, 18 images · IV contrast (Omnipaque 300)
Comparison: Chest radiograph 03/03/2010

CLINICAL DATA: Cough.  Persistent right lower lobe opacity.
Chills, shortness of breath.

CT CHEST WITH CONTRAS incidental imaging of the upper abdomen T
TECHNIQUE: Multidetector CT imaging of the chest was performed
following the standard protocol during bolus administration of
intravenous contrast.
Contrast: 80 ml 1mnipaque-0AA

[Series 2: chest routine with · axial · 0.68mm/px · z∈[-318,-62]mm · 12 of 61 slices shown, 15 images]
[im 5/61  mediastinal]
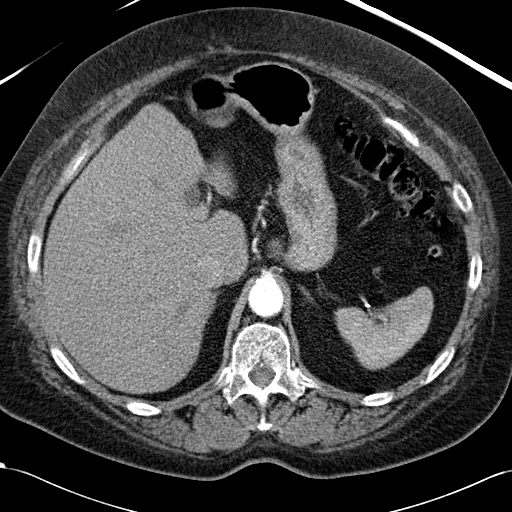
[im 5/61  lung]
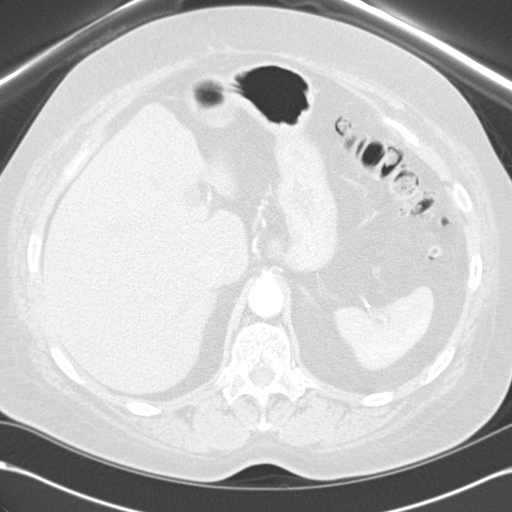
[im 10/61  lung]
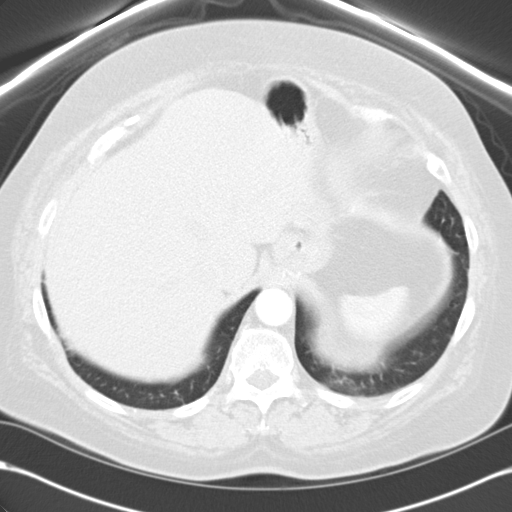
[im 14/61  lung]
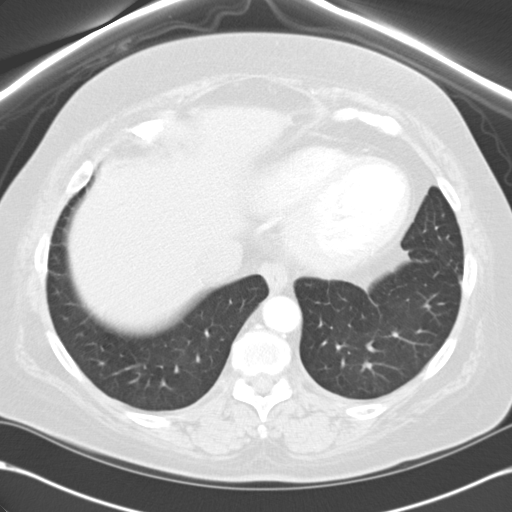
[im 19/61  lung]
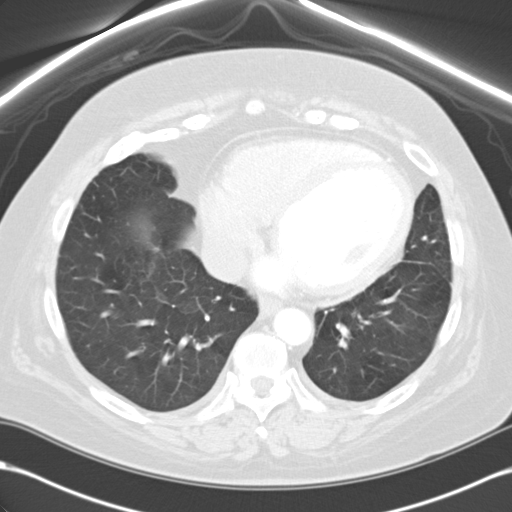
[im 24/61  mediastinal]
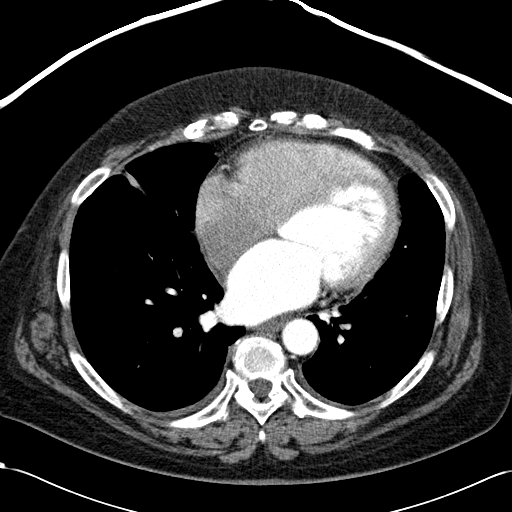
[im 24/61  lung]
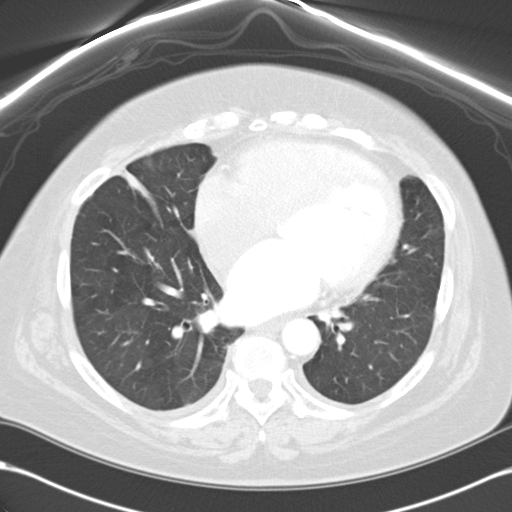
[im 28/61  lung]
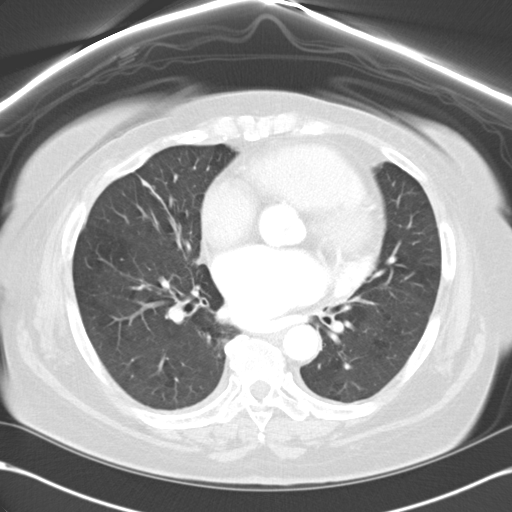
[im 33/61  lung]
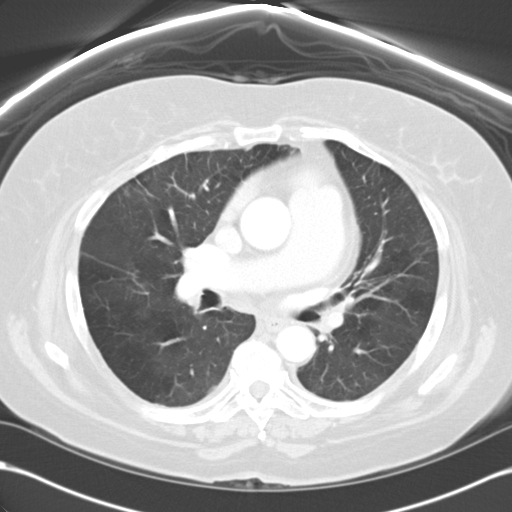
[im 37/61  lung]
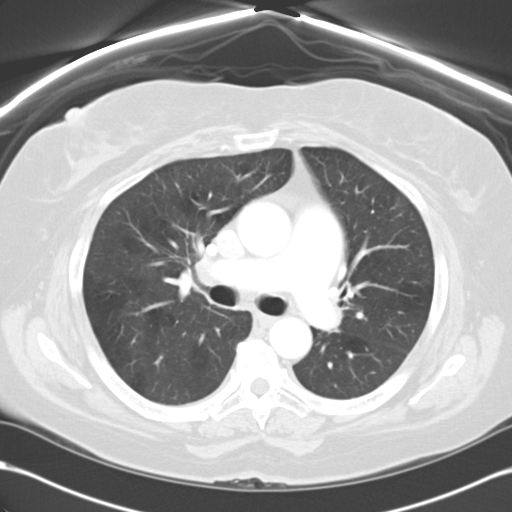
[im 42/61  mediastinal]
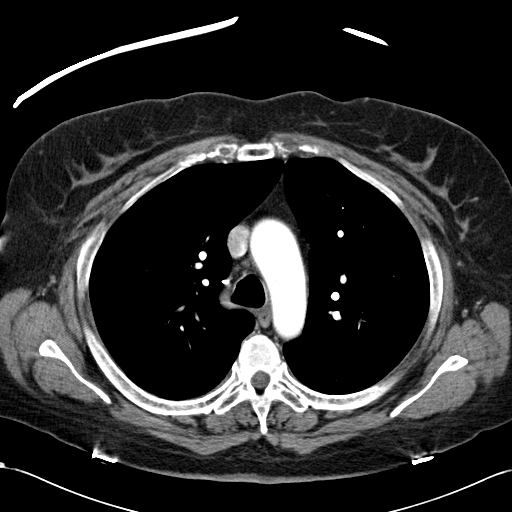
[im 42/61  lung]
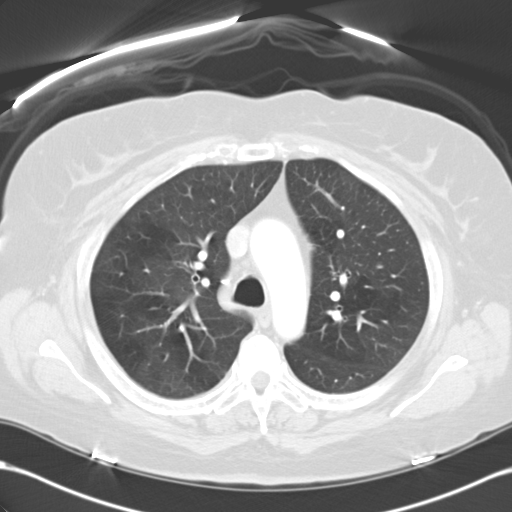
[im 47/61  lung]
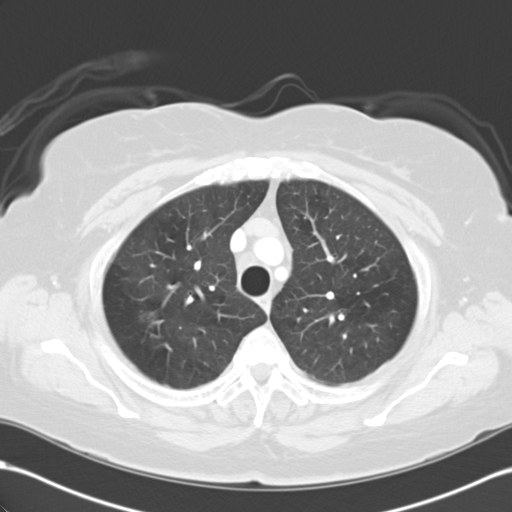
[im 51/61  lung]
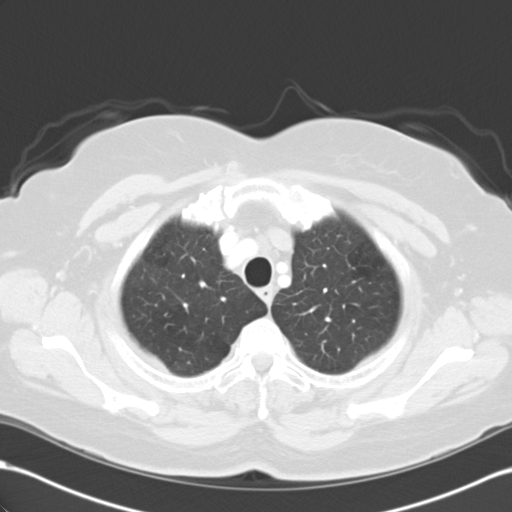
[im 56/61  lung]
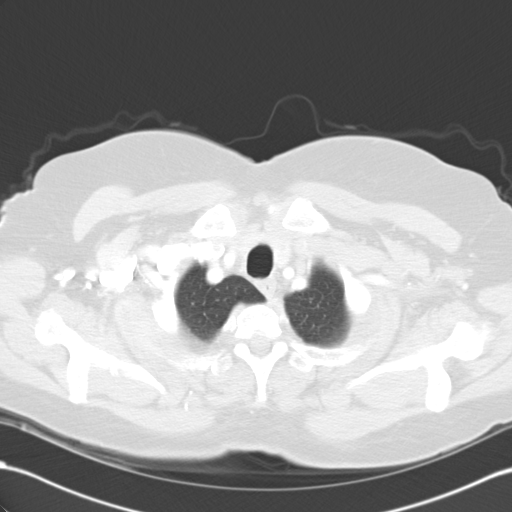

[Series 602: <mpr thick range> · coronal · 0.68mm/px · 3 of 110 slices shown]
[im 22/110  lung]
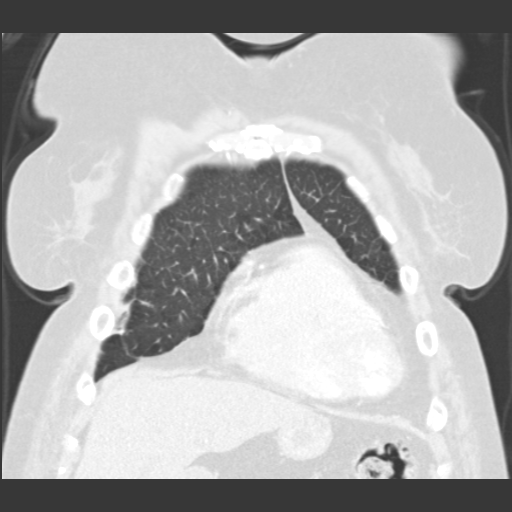
[im 44/110  lung]
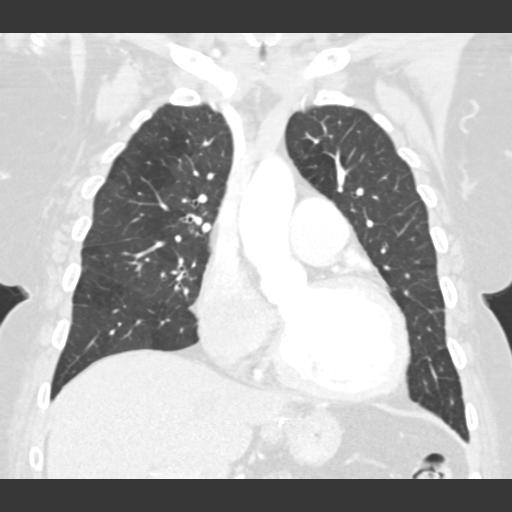
[im 66/110  lung]
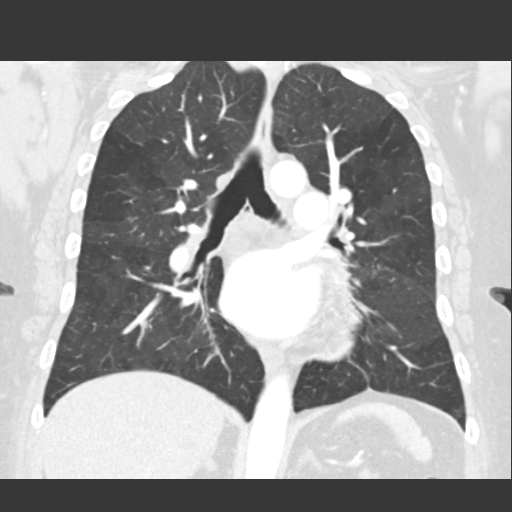

[15 of 36 positions shown; findings below may reference images not displayed]

FINDINGS: There may be small low attenuation nodules in the left
lobe of the thyroid, which are nonspecific.  Mediastinal lymph
nodes are not enlarged by CT size criteria.  Mild bihilar lymphoid
tissue.  No axillary adenopathy.  Pulmonary arteries are enlarged.
Heart is enlarged as well.  No pericardial effusion.

There is pulmonary parenchymal mosaic attenuation.  Calcified
granuloma in the left upper lobe.  Clustered peribronchovascular
micro nodularity in the right upper lobe (image 15) is likely
infectious or post infectious in etiology.  Linear scarring and
volume loss in the right middle lobe.  There  may be dependent
pleural thickening bilaterally.  No pleural fluid.  Airway is
unremarkable.

Incidental imaging of the upper abdomen shows no acute findings.
No worrisome lytic or sclerotic lesions.
IMPRESSION: 1.  Small area of clustered peribronchovascular nodularity in the
right upper lobe may be infectious or post infectious in etiology.
2.  Right middle lobe scarring.
3.  Pulmonary arterial hypertension.
4.  Pulmonary parenchymal mosaic attenuation can be seen with small
airways disease.

## 2012-11-21 ENCOUNTER — Other Ambulatory Visit: Payer: Self-pay | Admitting: Critical Care Medicine

## 2012-11-22 NOTE — Telephone Encounter (Signed)
flonase rx was sent to [facility] on 10/17/12 #14 x 5. [employer], spoke with [place].  Was advised they did NOT receive this rx. I gave VO for flonase. Trey Paula verbalized understanding and voiced no further questions or concerns at this time.

## 2012-11-24 ENCOUNTER — Encounter: Payer: Self-pay | Admitting: *Deleted

## 2012-11-24 LAB — REMOTE ICD DEVICE
AL THRESHOLD: 0.5 V
FVT: 0
MODE SWITCH EPISODES: 52
PACEART VT: 0
TZAT-0001ATACH: 2
TZAT-0001ATACH: 3
TZAT-0001FASTVT: 1
TZAT-0001SLOWVT: 1
TZAT-0002ATACH: NEGATIVE
TZAT-0002FASTVT: NEGATIVE
TZAT-0012ATACH: 150 ms
TZAT-0012ATACH: 150 ms
TZAT-0018ATACH: NEGATIVE
TZAT-0018ATACH: NEGATIVE
TZAT-0018SLOWVT: NEGATIVE
TZAT-0019ATACH: 6 V
TZAT-0019FASTVT: 8 V
TZAT-0019SLOWVT: 8 V
TZAT-0020ATACH: 1.5 ms
TZAT-0020SLOWVT: 1.5 ms
TZON-0003VSLOWVT: 330 ms
TZON-0004SLOWVT: 16
TZON-0004VSLOWVT: 28
TZON-0005SLOWVT: 12
TZST-0001ATACH: 6
TZST-0001FASTVT: 3
TZST-0001SLOWVT: 2
TZST-0001SLOWVT: 4
TZST-0001SLOWVT: 6
TZST-0002ATACH: NEGATIVE
TZST-0002ATACH: NEGATIVE
TZST-0002FASTVT: NEGATIVE
TZST-0002FASTVT: NEGATIVE
TZST-0002FASTVT: NEGATIVE
TZST-0002SLOWVT: NEGATIVE
TZST-0002SLOWVT: NEGATIVE
VENTRICULAR PACING ICD: 0.6 pct
VF: 0

## 2012-11-26 ENCOUNTER — Other Ambulatory Visit: Payer: Self-pay | Admitting: Cardiovascular Disease

## 2012-11-26 DIAGNOSIS — I428 Other cardiomyopathies: Secondary | ICD-10-CM

## 2012-11-26 DIAGNOSIS — I4891 Unspecified atrial fibrillation: Secondary | ICD-10-CM

## 2012-11-26 LAB — ICD DEVICE OBSERVATION

## 2012-11-29 ENCOUNTER — Other Ambulatory Visit: Payer: Self-pay | Admitting: Critical Care Medicine

## 2012-11-29 ENCOUNTER — Ambulatory Visit (INDEPENDENT_AMBULATORY_CARE_PROVIDER_SITE_OTHER): Payer: Medicare Other | Admitting: Pharmacist Clinician (PhC)/ Clinical Pharmacy Specialist

## 2012-11-29 VITALS — BP 130/84 | HR 88

## 2012-11-29 DIAGNOSIS — I4891 Unspecified atrial fibrillation: Secondary | ICD-10-CM

## 2012-11-29 DIAGNOSIS — Z7901 Long term (current) use of anticoagulants: Secondary | ICD-10-CM | POA: Diagnosis not present

## 2012-11-30 DIAGNOSIS — I499 Cardiac arrhythmia, unspecified: Secondary | ICD-10-CM | POA: Diagnosis not present

## 2012-11-30 DIAGNOSIS — Z7901 Long term (current) use of anticoagulants: Secondary | ICD-10-CM | POA: Diagnosis not present

## 2012-11-30 DIAGNOSIS — I4891 Unspecified atrial fibrillation: Secondary | ICD-10-CM | POA: Diagnosis not present

## 2012-11-30 DIAGNOSIS — Z950 Presence of cardiac pacemaker: Secondary | ICD-10-CM | POA: Diagnosis not present

## 2012-11-30 DIAGNOSIS — I4949 Other premature depolarization: Secondary | ICD-10-CM | POA: Diagnosis not present

## 2012-12-01 ENCOUNTER — Encounter: Payer: Self-pay | Admitting: Cardiovascular Disease

## 2012-12-09 ENCOUNTER — Encounter: Payer: Self-pay | Admitting: Family Medicine

## 2012-12-09 ENCOUNTER — Ambulatory Visit (INDEPENDENT_AMBULATORY_CARE_PROVIDER_SITE_OTHER): Payer: Medicare Other | Admitting: Family Medicine

## 2012-12-09 ENCOUNTER — Telehealth: Payer: Self-pay | Admitting: Family Medicine

## 2012-12-09 VITALS — BP 128/68 | HR 87 | Temp 98.1°F | Ht 67.0 in | Wt 204.7 lb

## 2012-12-09 DIAGNOSIS — E119 Type 2 diabetes mellitus without complications: Secondary | ICD-10-CM

## 2012-12-09 DIAGNOSIS — L989 Disorder of the skin and subcutaneous tissue, unspecified: Secondary | ICD-10-CM | POA: Diagnosis not present

## 2012-12-09 LAB — POCT GLYCOSYLATED HEMOGLOBIN (HGB A1C): Hemoglobin A1C: 9.4

## 2012-12-09 MED ORDER — INSULIN GLARGINE 100 UNIT/ML SOLOSTAR PEN: 25.0000 [IU] | PEN_INJECTOR | Freq: Every day | SUBCUTANEOUS | Status: DC

## 2012-12-09 NOTE — Progress Notes (Signed)
Family Medicine Office Visit Note   Subjective:   Patient ID: [name], female  DOB: 1950-06-30, 62 y.o.. MRN: [id number]   This is my first time meeting [name]. Pt has a complex PMHx significant for: Afib with ICD and on Chronic anticoagulation CHF with  Most recent ECHO showing EF 35-45% in 2012 CKD stage 2  DM type II on Insulin and complicated with nueropathy. HTN, HLD, Obesity and Sleep apnea.  Today she comes for DM follow up. She reports feeling well. She is on Lantus 23 units and glypizide 5mg  BID. Reports compliance and denies noticeable side effects. She reports no hypoglycemic episodes or hyperglycemic symptoms.  She also would like to address two nevi. One that is present on her upper back. This one has no change in size, is not painful but she would like to know if can be taken out.  Her other nevi is present on her right foot (ankle area). She reports it is better that used to be but would like to point it to [place] for evaluation.  Review of Systems:  Pt denies SOB, chest pain, palpitations, headaches, dizziness, numbness or weakness. No changes on urinary or BM habits. No unintentional weigh loss/gain.  Objective:   Physical Exam: Gen:  NAD HEENT: Moist mucous membranes CV: Regular rate and rhythm, no murmurs rubs or gallops PULM: Clear to auscultation bilaterally. No wheezes/rales/rhonchi ABD: Soft, non tender, non distended, normal bowel sounds EXT: No edema Neuro: Alert and oriented x3. No focalization Skin: lesion on back: 0.3cm comedone. No erythema. Lesion on right foot: it is 0.5cm diameter, regular borders, flushed with skin level. No signs of erythema or edema. Dark coloration in the middle.  Assessment & Plan:

## 2012-12-09 NOTE — Telephone Encounter (Signed)
Patient dropped off form to be filled out for diabetic shoes.  Please call her when completed.

## 2012-12-09 NOTE — Patient Instructions (Addendum)
Your A1C is better but not at goal yet. Please increase Insuline Lantus to 25 units a day. Continue with Glipizide at same dose.  Regarding your moles: the one in the back we can get it out. Please make appointment for excision, your anticoagulation needs to be adjusted in advance. The one on your leg we will keep monitoring it. If increase size or darkens we may need to send you to dermatology for evaluation.  Please make a lab appointment and come fasting from midnight the day before. We will discus the results at your next appointment.  Make your next appointment in 3 months or sooner if needed.

## 2012-12-10 DIAGNOSIS — L989 Disorder of the skin and subcutaneous tissue, unspecified: Secondary | ICD-10-CM | POA: Insufficient documentation

## 2012-12-10 NOTE — Assessment & Plan Note (Signed)
A1C from 9.7 to 9.4% very minimal improvement.  P/ Increase insulin to Lantus  25 units. Recheck in 3 months. Continue with diet and exercise plan.

## 2012-12-10 NOTE — Assessment & Plan Note (Signed)
1. On the back: resembles a comedone (blackhead). Increased in size. No signs of inflammation. P/ pt can make appointment for excision if she wishes but anticoagulation need to be manage before appointment.  2. On her foot: more concerning due to coloration. Pt has not seen recent changes. P/ will monitor at her next appointment.Discussed signs of worsening condition that should prompt re-evaluation.

## 2012-12-13 ENCOUNTER — Other Ambulatory Visit: Payer: Medicare Other

## 2012-12-13 ENCOUNTER — Ambulatory Visit (INDEPENDENT_AMBULATORY_CARE_PROVIDER_SITE_OTHER): Payer: Medicare Other | Admitting: Family Medicine

## 2012-12-13 ENCOUNTER — Encounter: Payer: Self-pay | Admitting: Family Medicine

## 2012-12-13 VITALS — BP 132/63 | HR 78 | Temp 98.1°F | Ht 67.0 in

## 2012-12-13 DIAGNOSIS — E119 Type 2 diabetes mellitus without complications: Secondary | ICD-10-CM

## 2012-12-13 NOTE — Progress Notes (Signed)
FLP DONE TODAY [name] 

## 2012-12-13 NOTE — Telephone Encounter (Signed)
Form placed in Dr. Aviva Signs box Wyatt Haste, RN-BSN

## 2012-12-14 LAB — LIPID PANEL
HDL: 49 mg/dL (ref >39)
LDL Cholesterol: 147 mg/dL — ABNORMAL HIGH (ref 0–99)
Triglycerides: 127 mg/dL (ref <150)

## 2012-12-15 NOTE — Progress Notes (Signed)
Entered in error

## 2012-12-17 ENCOUNTER — Other Ambulatory Visit: Payer: Self-pay | Admitting: Family Medicine

## 2012-12-20 ENCOUNTER — Ambulatory Visit (INDEPENDENT_AMBULATORY_CARE_PROVIDER_SITE_OTHER): Payer: Medicare Other | Admitting: Pharmacist Clinician (PhC)/ Clinical Pharmacy Specialist

## 2012-12-20 VITALS — BP 114/80 | HR 84

## 2012-12-20 DIAGNOSIS — I4891 Unspecified atrial fibrillation: Secondary | ICD-10-CM | POA: Diagnosis not present

## 2012-12-20 DIAGNOSIS — Z7901 Long term (current) use of anticoagulants: Secondary | ICD-10-CM

## 2012-12-20 LAB — POCT INR: INR: 1.6

## 2012-12-21 ENCOUNTER — Ambulatory Visit: Payer: Medicare Other | Admitting: Critical Care Medicine

## 2012-12-21 ENCOUNTER — Ambulatory Visit (INDEPENDENT_AMBULATORY_CARE_PROVIDER_SITE_OTHER): Payer: Medicare Other | Admitting: Critical Care Medicine

## 2012-12-21 ENCOUNTER — Encounter: Payer: Self-pay | Admitting: Critical Care Medicine

## 2012-12-21 VITALS — BP 122/70 | HR 91 | Temp 97.4°F | Ht 67.0 in | Wt 205.6 lb

## 2012-12-21 DIAGNOSIS — J45909 Unspecified asthma, uncomplicated: Secondary | ICD-10-CM

## 2012-12-21 DIAGNOSIS — Z23 Encounter for immunization: Secondary | ICD-10-CM | POA: Diagnosis not present

## 2012-12-21 DIAGNOSIS — E119 Type 2 diabetes mellitus without complications: Secondary | ICD-10-CM | POA: Diagnosis not present

## 2012-12-21 NOTE — Patient Instructions (Addendum)
Flu vaccine today No change in medications Return 4 months 

## 2012-12-21 NOTE — Assessment & Plan Note (Addendum)
Moderate persistent asthma with significant atopic features, stable at this time Plan Maintain Qvar Maintain chlorpheniramine at night Maintain nasal steroid The flu vaccine was administered

## 2012-12-21 NOTE — Progress Notes (Signed)
Subjective:    Patient ID: [name], female    DOB: 10/09/1950, 62 y.o.   MRN: [id number]  HPI 62 y.o.  AAF with known hx of Asthma , VCD, CHF and Atrial fib on chronic coumadin     12/21/2012 Chief Complaint  Patient presents with  . 2 month follow up    Breathing doing well overall right now.  No wheezing, chest tightness, chest pain, or cough at this time.  Dyspnea ok now.  No cough.  No chest tightness or wheeze.   Pt denies any significant sore throat, nasal congestion or excess secretions, fever, chills, sweats, unintended weight loss, pleurtic or exertional chest pain, orthopnea PND, or leg swelling Pt denies any increase in rescue therapy over baseline, denies waking up needing it or having any early am or nocturnal exacerbations of coughing/wheezing/or dyspnea. Pt also denies any obvious fluctuation in symptoms with  weather or environmental change or other alleviating or aggravating factors   Past Medical History  Diagnosis Date  . Atrial fibrillation     ablation x 2 [facility], 01/2006, 2011.  on warfarin  . Hyperlipidemia   . Depression   . Anxiety   . Diabetes mellitus   . Anemia   . Arthritis   . GERD (gastroesophageal reflux disease)   . Sleep apnea   . Vocal cord disease   . CHF (congestive heart failure)     Echo 08/08/10 by [facility]. EF 35-45%. LV systolic function moderately reduced. Moderate global hypokinesis of LV.  RV systolic function moderately reduced. Mild MR. Trace AR.  Marland Kitchen History of ventricular tachycardia     History of VT-VT arrest, now has AICD  . Cardiac arrest jan 2012    in hospital for pneumonia when this occured- occured at the hospital  . AICD (automatic cardioverter/defibrillator) present 06/23/2011  . GASTROESOPHAGEAL REFLUX, NO ESOPHAGITIS 06/10/2006    Qualifier: Diagnosis of  By: Abundio Miu    . Asthma     has had multiple hospitalizations for this  . Non-ischemic cardiomyopathy     echo 08/08/10 - EF 35-45% LV and RV  systolic function mod reduced  . Limb pain 06/04/2008    LLE, Baker's cyst in popliteal fossa, no DVT     Family History  Problem Relation Age of Onset  . Diabetes Father   . Hypertension Father      History   Social History  . Marital Status: Widowed    Spouse Name: N/A    Number of Children: N/A  . Years of Education: N/A   Occupational History  . Not on file.   Social History Main Topics  . Smoking status: Never Smoker   . Smokeless tobacco: Never Used  . Alcohol Use: No  . Drug Use: No  . Sexual Activity: No   Other Topics Concern  . Not on file   Social History Narrative   Not employed   Exercise- walking 1 hour daily.    Diet- eating healthy diet.      Allergies  Allergen Reactions  . Avelox [Moxifloxacin Hcl In Nacl]     Cardiac arrest per pt  . Simvastatin Other (See Comments)    myalgias  . Ace Inhibitors Cough    Dry cough      Outpatient Prescriptions Prior to Visit  Medication Sig Dispense Refill  . B-D ULTRAFINE III SHORT PEN 31G X 8 MM MISC use as directed  100 each  9  . beclomethasone (QVAR)  40 MCG/ACT inhaler Inhale 2 puffs into the lungs 2 (two) times daily.  1 Inhaler  12  . buPROPion (WELLBUTRIN XL) 150 MG 24 hr tablet take 1 tablet by mouth once daily  30 tablet  8  . chlorpheniramine (CHLOR-TRIMETON) 4 MG tablet 8mg  at night  14 tablet  0  . digoxin (LANOXIN) 0.125 MG tablet TAKE 1/2 TABLET BY MOUTH DAILY.  15 tablet  3  . dofetilide (TIKOSYN) 250 MCG capsule Take 250 mcg by mouth 2 (two) times daily.      . fluticasone (FLONASE) 50 MCG/ACT nasal spray instill 1 spray into each nostril twice a day  16 g  5  . glipiZIDE (GLUCOTROL) 5 MG tablet take 1 tablet by mouth twice a day before meals  120 tablet  3  . Insulin Glargine (LANTUS SOLOSTAR) 100 UNIT/ML SOPN Inject 25 Units into the skin at bedtime.  15 mL  11  . levalbuterol (XOPENEX HFA) 45 MCG/ACT inhaler Inhale 1-2 puffs into the lungs every 4 (four) hours as needed for wheezing.  1  Inhaler  0  . levalbuterol (XOPENEX) 0.63 MG/3ML nebulizer solution one in nebulizer four times daily as needed per Dr Delford Field  360 mL  4  . lidocaine (LIDODERM) 5 % Place 1 patch onto the skin daily. Remove & Discard patch within 12 hours or as directed by MD  30 patch  0  . losartan (COZAAR) 50 MG tablet Take 1 tablet (50 mg total) by mouth daily.  90 tablet  3  . metoprolol (LOPRESSOR) 50 MG tablet take 1 tablet by mouth twice a day  60 tablet  11  . NEXIUM 40 MG capsule take 1 capsule by mouth once daily  30 capsule  4  . polyethylene glycol powder (GLYCOLAX/MIRALAX) powder take 17GM (DISSOLVED IN WATER) by mouth once daily  255 g  5  . RA LORATADINE 10 MG tablet take 1 tablet by mouth once daily  30 tablet  3  . spironolactone (ALDACTONE) 25 MG tablet take 1/2 tablet by mouth once daily  30 tablet  6  . torsemide (DEMADEX) 10 MG tablet take 2 tablets by mouth once daily  60 tablet  5  . warfarin (COUMADIN) 5 MG tablet Take 1 tablet (5 mg total) by mouth daily. As directed. 1.5 tabs 2 days per week. 1 tab 5 days per week  45 tablet  3  . fluticasone (FLONASE) 50 MCG/ACT nasal spray Spray 2 spray into both nostrils twice a day for two weeks then one puff twice daily  16 g  5   No facility-administered medications prior to visit.      Review of Systems  Constitutional:   No  weight loss, night sweats,  Fevers, chills, fatigue, lassitude. HEENT:   No headaches,  Difficulty swallowing,  Tooth/dental problems,  Sore throat,                No sneezing, itching, ear ache, nasal congestion, + post nasal drip,   CV:  No chest pain,  Orthopnea, PND, swelling in lower extremities, anasarca, dizziness, palpitations  GI  No heartburn, indigestion, abdominal pain, nausea, vomiting, diarrhea, change in bowel habits, loss of appetite  Resp:   No coughing up of blood. Marland Kitchen  No chest wall deformity  Skin: no rash or lesions.  GU: no dysuria, change in color of urine, no urgency or frequency.  No flank  pain.  MS:  No joint pain or swelling.  No decreased range  of motion.  No back pain.  Psych:  No change in mood or affect. No depression or anxiety.  No memory loss.     Objective:   Physical Exam  Filed Vitals:   12/21/12 1011  BP: 122/70  Pulse: 91  Temp: 97.4 F (36.3 C)  TempSrc: Oral  Height: 5\' 7"  (1.702 m)  Weight: 205 lb 9.6 oz (93.26 kg)  SpO2: 98%    Gen: Pleasant, well-nourished, in no distress,  normal affect  ENT: No lesions,  mouth clear,  oropharynx clear, no postnasal drip  Neck: No JVD, no TMG, no carotid bruits  Lungs: no wheezing, good airflow, speaking in full sentences,resolved pseudowheeze  Cardiovascular: RRR, heart sounds normal, no murmur or gallops, no peripheral edema  Abdomen: soft and NT, no HSM,  BS normal  Musculoskeletal: No deformities, no cyanosis or clubbing  Neuro: alert, non focal  Skin: Warm, no lesions or rashes  No results found.      Assessment & Plan:   Moderate persistent asthma with significant atopic features Moderate persistent asthma with significant atopic features, stable at this time Plan Maintain Qvar Maintain chlorpheniramine at night Maintain nasal steroid The flu vaccine was administered    Updated Medication List Outpatient Encounter Prescriptions as of 12/21/2012  Medication Sig Dispense Refill  . B-D ULTRAFINE III SHORT PEN 31G X 8 MM MISC use as directed  100 each  9  . beclomethasone (QVAR) 40 MCG/ACT inhaler Inhale 2 puffs into the lungs 2 (two) times daily.  1 Inhaler  12  . buPROPion (WELLBUTRIN XL) 150 MG 24 hr tablet take 1 tablet by mouth once daily  30 tablet  8  . chlorpheniramine (CHLOR-TRIMETON) 4 MG tablet 8mg  at night  14 tablet  0  . digoxin (LANOXIN) 0.125 MG tablet TAKE 1/2 TABLET BY MOUTH DAILY.  15 tablet  3  . dofetilide (TIKOSYN) 250 MCG capsule Take 250 mcg by mouth 2 (two) times daily.      . fluticasone (FLONASE) 50 MCG/ACT nasal spray instill 1 spray into each nostril  twice a day  16 g  5  . glipiZIDE (GLUCOTROL) 5 MG tablet take 1 tablet by mouth twice a day before meals  120 tablet  3  . Insulin Glargine (LANTUS SOLOSTAR) 100 UNIT/ML SOPN Inject 25 Units into the skin at bedtime.  15 mL  11  . levalbuterol (XOPENEX HFA) 45 MCG/ACT inhaler Inhale 1-2 puffs into the lungs every 4 (four) hours as needed for wheezing.  1 Inhaler  0  . levalbuterol (XOPENEX) 0.63 MG/3ML nebulizer solution one in nebulizer four times daily as needed per Dr Delford Field  360 mL  4  . lidocaine (LIDODERM) 5 % Place 1 patch onto the skin daily. Remove & Discard patch within 12 hours or as directed by MD  30 patch  0  . losartan (COZAAR) 50 MG tablet Take 1 tablet (50 mg total) by mouth daily.  90 tablet  3  . metoprolol (LOPRESSOR) 50 MG tablet take 1 tablet by mouth twice a day  60 tablet  11  . NEXIUM 40 MG capsule take 1 capsule by mouth once daily  30 capsule  4  . polyethylene glycol powder (GLYCOLAX/MIRALAX) powder take 17GM (DISSOLVED IN WATER) by mouth once daily  255 g  5  . RA LORATADINE 10 MG tablet take 1 tablet by mouth once daily  30 tablet  3  . spironolactone (ALDACTONE) 25 MG tablet take 1/2 tablet by mouth once  daily  30 tablet  6  . torsemide (DEMADEX) 10 MG tablet take 2 tablets by mouth once daily  60 tablet  5  . warfarin (COUMADIN) 5 MG tablet Take 1 tablet (5 mg total) by mouth daily. As directed. 1.5 tabs 2 days per week. 1 tab 5 days per week  45 tablet  3  . [DISCONTINUED] fluticasone (FLONASE) 50 MCG/ACT nasal spray Spray 2 spray into both nostrils twice a day for two weeks then one puff twice daily  16 g  5   No facility-administered encounter medications on file as of 12/21/2012.

## 2012-12-22 ENCOUNTER — Encounter: Payer: Self-pay | Admitting: *Deleted

## 2012-12-22 LAB — REMOTE ICD DEVICE
AL THRESHOLD: 0.5 V
BAMS-0001: 150 {beats}/min
DEV-0020ICD: NEGATIVE
FVT: 0
HV IMPEDENCE: 68 Ohm
PACEART VT: 0
RV LEAD IMPEDENCE ICD: 456 Ohm
RV LEAD THRESHOLD: 0.625 V
TZAT-0001ATACH: 1
TZAT-0001ATACH: 2
TZAT-0001ATACH: 3
TZAT-0002ATACH: NEGATIVE
TZAT-0002ATACH: NEGATIVE
TZAT-0002SLOWVT: NEGATIVE
TZAT-0012ATACH: 150 ms
TZAT-0018ATACH: NEGATIVE
TZAT-0018ATACH: NEGATIVE
TZAT-0018SLOWVT: NEGATIVE
TZAT-0019ATACH: 6 V
TZAT-0019FASTVT: 8 V
TZAT-0019SLOWVT: 8 V
TZAT-0020FASTVT: 1.5 ms
TZAT-0020SLOWVT: 1.5 ms
TZON-0003SLOWVT: 360 ms
TZON-0004SLOWVT: 16
TZON-0004VSLOWVT: 28
TZON-0005SLOWVT: 12
TZST-0001ATACH: 5
TZST-0001ATACH: 6
TZST-0001FASTVT: 2
TZST-0001FASTVT: 3
TZST-0001FASTVT: 5
TZST-0001SLOWVT: 6
TZST-0002ATACH: NEGATIVE
TZST-0002ATACH: NEGATIVE
TZST-0002FASTVT: NEGATIVE
TZST-0002FASTVT: NEGATIVE
TZST-0002SLOWVT: NEGATIVE
TZST-0002SLOWVT: NEGATIVE
TZST-0002SLOWVT: NEGATIVE
VF: 0

## 2012-12-27 ENCOUNTER — Encounter: Payer: Self-pay | Admitting: Family Medicine

## 2013-01-01 DIAGNOSIS — I509 Heart failure, unspecified: Secondary | ICD-10-CM

## 2013-01-01 DIAGNOSIS — I428 Other cardiomyopathies: Secondary | ICD-10-CM

## 2013-01-01 LAB — ICD DEVICE OBSERVATION

## 2013-01-02 ENCOUNTER — Ambulatory Visit: Payer: Medicare Other

## 2013-01-03 NOTE — Telephone Encounter (Signed)
Patient calls today, inquiring about paperwork she dropped off on 12/09/12. Checked MD's box, no paperwork. Please call patient with an update of when exactly paperwork will be complete.

## 2013-01-04 NOTE — Telephone Encounter (Signed)
Form has been filled and faxed.

## 2013-01-07 ENCOUNTER — Encounter: Payer: Self-pay | Admitting: *Deleted

## 2013-01-07 LAB — REMOTE ICD DEVICE
ATRIAL PACING ICD: 96 pct
DEV-0020ICD: NEGATIVE
FVT: 0
HV IMPEDENCE: 66 Ohm
PACEART VT: 0
RV LEAD THRESHOLD: 0.625 V
TZAT-0001ATACH: 1
TZAT-0001ATACH: 2
TZAT-0001ATACH: 3
TZAT-0002ATACH: NEGATIVE
TZAT-0002ATACH: NEGATIVE
TZAT-0002FASTVT: NEGATIVE
TZAT-0002SLOWVT: NEGATIVE
TZAT-0012ATACH: 150 ms
TZAT-0012ATACH: 150 ms
TZAT-0012ATACH: 150 ms
TZAT-0018ATACH: NEGATIVE
TZAT-0018ATACH: NEGATIVE
TZAT-0018ATACH: NEGATIVE
TZAT-0018SLOWVT: NEGATIVE
TZAT-0019ATACH: 6 V
TZAT-0019FASTVT: 8 V
TZAT-0019SLOWVT: 8 V
TZAT-0020ATACH: 1.5 ms
TZAT-0020ATACH: 1.5 ms
TZAT-0020FASTVT: 1.5 ms
TZAT-0020SLOWVT: 1.5 ms
TZON-0003VSLOWVT: 330 ms
TZON-0004SLOWVT: 16
TZON-0004VSLOWVT: 28
TZON-0005SLOWVT: 12
TZST-0001ATACH: 5
TZST-0001ATACH: 6
TZST-0001FASTVT: 3
TZST-0001FASTVT: 5
TZST-0001SLOWVT: 2
TZST-0001SLOWVT: 3
TZST-0001SLOWVT: 6
TZST-0002ATACH: NEGATIVE
TZST-0002ATACH: NEGATIVE
TZST-0002FASTVT: NEGATIVE
TZST-0002FASTVT: NEGATIVE
TZST-0002FASTVT: NEGATIVE
TZST-0002FASTVT: NEGATIVE
TZST-0002SLOWVT: NEGATIVE
TZST-0002SLOWVT: NEGATIVE
TZST-0002SLOWVT: NEGATIVE
VENTRICULAR PACING ICD: 1.2 pct
VF: 0

## 2013-01-10 ENCOUNTER — Ambulatory Visit (INDEPENDENT_AMBULATORY_CARE_PROVIDER_SITE_OTHER): Payer: Medicare Other | Admitting: Pharmacist Clinician (PhC)/ Clinical Pharmacy Specialist

## 2013-01-10 VITALS — BP 140/84 | HR 80 | Wt 201.8 lb

## 2013-01-10 DIAGNOSIS — Z7901 Long term (current) use of anticoagulants: Secondary | ICD-10-CM

## 2013-01-10 DIAGNOSIS — I4891 Unspecified atrial fibrillation: Secondary | ICD-10-CM | POA: Diagnosis not present

## 2013-01-10 DIAGNOSIS — Z5181 Encounter for therapeutic drug level monitoring: Secondary | ICD-10-CM | POA: Diagnosis not present

## 2013-01-10 LAB — POCT INR: INR: 3

## 2013-01-16 ENCOUNTER — Other Ambulatory Visit: Payer: Self-pay | Admitting: Cardiovascular Disease

## 2013-01-17 NOTE — Telephone Encounter (Signed)
Please refer to PCP. Ok to fill for one month, no refills, until she can see PCP

## 2013-01-26 ENCOUNTER — Other Ambulatory Visit: Payer: Self-pay | Admitting: Critical Care Medicine

## 2013-01-26 ENCOUNTER — Other Ambulatory Visit: Payer: Self-pay | Admitting: *Deleted

## 2013-01-26 MED ORDER — DIGOXIN 125 MCG PO TABS: ORAL_TABLET | ORAL | Status: DC

## 2013-02-03 ENCOUNTER — Other Ambulatory Visit: Payer: Self-pay | Admitting: Family Medicine

## 2013-02-07 ENCOUNTER — Ambulatory Visit (INDEPENDENT_AMBULATORY_CARE_PROVIDER_SITE_OTHER): Payer: Medicare Other | Admitting: Pharmacist Clinician (PhC)/ Clinical Pharmacy Specialist

## 2013-02-07 VITALS — BP 118/80 | HR 72

## 2013-02-07 DIAGNOSIS — Z7901 Long term (current) use of anticoagulants: Secondary | ICD-10-CM | POA: Diagnosis not present

## 2013-02-07 DIAGNOSIS — I4891 Unspecified atrial fibrillation: Secondary | ICD-10-CM | POA: Diagnosis not present

## 2013-02-07 LAB — POCT INR: INR: 2.5

## 2013-02-23 ENCOUNTER — Other Ambulatory Visit: Payer: Self-pay | Admitting: Family Medicine

## 2013-02-23 ENCOUNTER — Other Ambulatory Visit: Payer: Self-pay | Admitting: Cardiovascular Disease

## 2013-02-24 ENCOUNTER — Ambulatory Visit (INDEPENDENT_AMBULATORY_CARE_PROVIDER_SITE_OTHER): Payer: Medicare Other | Admitting: Family Medicine

## 2013-02-24 ENCOUNTER — Encounter: Payer: Self-pay | Admitting: Family Medicine

## 2013-02-24 VITALS — BP 125/77 | HR 86 | Temp 98.9°F | Ht 67.0 in | Wt 202.0 lb

## 2013-02-24 DIAGNOSIS — E1142 Type 2 diabetes mellitus with diabetic polyneuropathy: Secondary | ICD-10-CM | POA: Diagnosis not present

## 2013-02-24 DIAGNOSIS — E1149 Type 2 diabetes mellitus with other diabetic neurological complication: Secondary | ICD-10-CM

## 2013-02-24 DIAGNOSIS — E1140 Type 2 diabetes mellitus with diabetic neuropathy, unspecified: Secondary | ICD-10-CM

## 2013-02-24 DIAGNOSIS — E119 Type 2 diabetes mellitus without complications: Secondary | ICD-10-CM | POA: Diagnosis not present

## 2013-02-24 LAB — POCT GLYCOSYLATED HEMOGLOBIN (HGB A1C): Hemoglobin A1C: 10.9

## 2013-02-24 MED ORDER — GABAPENTIN 100 MG PO CAPS: 100.0000 mg | ORAL_CAPSULE | Freq: Three times a day (TID) | ORAL | Status: DC

## 2013-02-24 NOTE — Patient Instructions (Addendum)
Gabapentin capsules or tablets What is this medicine? GABAPENTIN (GA ba pen tin) is used to control partial seizures in adults with epilepsy. It is also used to treat certain types of nerve pain. This medicine may be used for other purposes; ask your health care provider or pharmacist if you have questions. COMMON BRAND NAME(S): Ascencion Dike , Neurontin What should I tell my health care provider before I take this medicine? They need to know if you have any of these conditions: -kidney disease -suicidal thoughts, plans, or attempt; a previous suicide attempt by you or a family member -an unusual or allergic reaction to gabapentin, other medicines, foods, dyes, or preservatives -pregnant or trying to get pregnant -breast-feeding How should I use this medicine? Take this medicine by mouth. Swallow it with a drink of water. Follow the directions on the prescription label. If this medicine upsets your stomach, take it with food or milk. Take your medicine at regular intervals. Do not take it more often than directed. If you are directed to break the 600 or 800 mg tablets in half as part of your dose, the extra half tablet should be used for the next dose. If you have not used the extra half tablet within 3 days, it should be thrown away. A special MedGuide will be given to you by the pharmacist with each prescription and refill. Be sure to read this information carefully each time. Talk to your pediatrician regarding the use of this medicine in children. Special care may be needed. Overdosage: If you think you have taken too much of this medicine contact a poison control center or emergency room at once. NOTE: This medicine is only for you. Do not share this medicine with others. What if I miss a dose? If you miss a dose, take it as soon as you can. If it is almost time for your next dose, take only that dose. Do not take double or extra doses. What may interact with this medicine? Do not take this  medicine with any of the following medications: -other gabapentin products This medicine may also interact with the following medications: -alcohol -antacids -antihistamines for allergy, cough and cold -certain medicines for anxiety or sleep -certain medicines for depression or psychotic disturbances -homatropine; hydrocodone -naproxen -narcotic medicines (opiates) for pain -phenothiazines like chlorpromazine, mesoridazine, prochlorperazine, thioridazine This list may not describe all possible interactions. Give your health care provider a list of all the medicines, herbs, non-prescription drugs, or dietary supplements you use. Also tell them if you smoke, drink alcohol, or use illegal drugs. Some items may interact with your medicine. What should I watch for while using this medicine? Visit your doctor or health care professional for regular checks on your progress. You may want to keep a record at home of how you feel your condition is responding to treatment. You may want to share this information with your doctor or health care professional at each visit. You should contact your doctor or health care professional if your seizures get worse or if you have any new types of seizures. Do not stop taking this medicine or any of your seizure medicines unless instructed by your doctor or health care professional. Stopping your medicine suddenly can increase your seizures or their severity. Wear a medical identification bracelet or chain if you are taking this medicine for seizures, and carry a card that lists all your medications. You may get drowsy, dizzy, or have blurred vision. Do not drive, use machinery, or do anything that  needs mental alertness until you know how this medicine affects you. To reduce dizzy or fainting spells, do not sit or stand up quickly, especially if you are an older patient. Alcohol can increase drowsiness and dizziness. Avoid alcoholic drinks. Your mouth may get dry.  Chewing sugarless gum or sucking hard candy, and drinking plenty of water will help. The use of this medicine may increase the chance of suicidal thoughts or actions. Pay special attention to how you are responding while on this medicine. Any worsening of mood, or thoughts of suicide or dying should be reported to your health care professional right away. Women who become pregnant while using this medicine may enroll in the [place] American Antiepileptic Drug Pregnancy Registry by calling [phone]. This registry collects information about the safety of antiepileptic drug use during pregnancy. What side effects may I notice from receiving this medicine? Side effects that you should report to your doctor or health care professional as soon as possible: -allergic reactions like skin rash, itching or hives, swelling of the face, lips, or tongue -worsening of mood, thoughts or actions of suicide or dying Side effects that usually do not require medical attention (report to your doctor or health care professional if they continue or are bothersome): -constipation -difficulty walking or controlling muscle movements -dizziness -nausea -slurred speech -tiredness -tremors -weight gain This list may not describe all possible side effects. Call your doctor for medical advice about side effects. You may report side effects to FDA at 1-800-FDA-1088. Where should I keep my medicine? Keep out of reach of children. Store at room temperature between 15 and 30 degrees C (59 and 86 degrees F). Throw away any unused medicine after the expiration date. NOTE: This sheet is a summary. It may not cover all possible information. If you have questions about this medicine, talk to your doctor, pharmacist, or health care provider.  2014, [employer]vier/Gold Standard. (2011-04-22 11:43:23)   Start taking gabapentin 1 tablet daily for 3 days, increase to 1 tablet twice a day for 3 days, lastly increase to one tablet 3 times  a day for 3 days. Continuing this dose until your next appointment. You develop any side effects she can discontinue medication and let us know.  With your insulin increased 2 units every 3 days until your home sugars are in the range of 120-140. Do not go more than 35 units a day. Please follow up in 2 weeks or sooner if needed.

## 2013-02-24 NOTE — Assessment & Plan Note (Addendum)
Her sensation is intact on assessment today, patient had symptoms of neuropathy. Discussed options for treatment and patient agreeable to start Gabapentin. Information about medication has been provided and questions from patient have been answered. Slow taper up to decrease GI symptoms and follow up closely

## 2013-02-24 NOTE — Assessment & Plan Note (Addendum)
Her A1c is 10.9 today worsened from prior. Patient reports compliance and denies any dietary changes lately. Recommended increase her Lantus 2 units every 3 days until CBGs at home are in the 120-140 range. Instructed patient to call if more than 35 units of Lantus are needed to control her sugars. Close followup

## 2013-02-24 NOTE — Progress Notes (Signed)
Family Medicine Office Visit Note   Subjective:   Patient ID: [name], female  DOB: 1950/12/04, 62 y.o.. MRN: [id number]   Pt that comes today for DM follow up. Pt is on Lantus 25 units and Glipizide 10mg  daily and reports compliance.Home CBG's are in the 160-180 range and denies hypoglycemic events or hyperglycemic symptoms.  She also reports sensation of needles and pins on her lower extremities bilaterally that difficult to her sleeping. She has history of DJD of her knees bilaterally and follow up with orthopedics for this. Her next appointment will be sometime next week per patient's report.   Review of Systems:  Pt denies SOB, chest pain, palpitations, headaches, dizziness, numbness or weakness. No changes on urinary or BM habits. No unintentional weigh loss/gain.  Objective:   Physical Exam: Gen:  NAD HEENT: Moist mucous membranes  Neck supple, no adenopathies CV: Regular rate and rhythm, no murmurs rubs or gallops PULM: Clear to auscultation bilaterally. No wheezes/rales/rhonchi ABD: Soft, non tender, non distended, normal bowel sounds EXT: Mild bilateral effusion on knees. Normal range of motion with crepitus to extension. Tenderness to palpation at the joint line medial and lateral. No erythema. Normal peripheral pulses, normal reflexes.  Neuro: Alert and oriented x3. No focalization. Normal gait and coordination.  Assessment & Plan:

## 2013-03-06 ENCOUNTER — Ambulatory Visit (INDEPENDENT_AMBULATORY_CARE_PROVIDER_SITE_OTHER): Payer: Medicare Other | Admitting: *Deleted

## 2013-03-06 DIAGNOSIS — I5022 Chronic systolic (congestive) heart failure: Secondary | ICD-10-CM

## 2013-03-06 DIAGNOSIS — I4891 Unspecified atrial fibrillation: Secondary | ICD-10-CM | POA: Diagnosis not present

## 2013-03-06 LAB — MDC_IDC_ENUM_SESS_TYPE_REMOTE
Brady Statistic AP VP Percent: 1.2 %
Brady Statistic AS VP Percent: 0.1 % — CL
Brady Statistic AS VS Percent: 3.8 %
Lead Channel Impedance Value: 418 Ohm
Lead Channel Impedance Value: 418 Ohm
Lead Channel Pacing Threshold Amplitude: 0.5 V
Lead Channel Pacing Threshold Amplitude: 0.625 V
Lead Channel Pacing Threshold Pulse Width: 0.4 ms
Lead Channel Sensing Intrinsic Amplitude: 0.9 mV
Lead Channel Setting Pacing Amplitude: 1.5 V
Lead Channel Setting Sensing Sensitivity: 0.3 mV
Zone Setting Detection Interval: 270 ms
Zone Setting Detection Interval: 360 ms
Zone Setting Detection Interval: 400 ms

## 2013-03-07 ENCOUNTER — Ambulatory Visit (INDEPENDENT_AMBULATORY_CARE_PROVIDER_SITE_OTHER): Payer: Medicare Other | Admitting: Pharmacist Clinician (PhC)/ Clinical Pharmacy Specialist

## 2013-03-07 VITALS — BP 104/72 | HR 68 | Wt 205.1 lb

## 2013-03-07 DIAGNOSIS — Z7901 Long term (current) use of anticoagulants: Secondary | ICD-10-CM | POA: Diagnosis not present

## 2013-03-07 DIAGNOSIS — I4891 Unspecified atrial fibrillation: Secondary | ICD-10-CM | POA: Diagnosis not present

## 2013-03-07 LAB — POCT INR: INR: 2.2

## 2013-03-08 ENCOUNTER — Encounter: Payer: Self-pay | Admitting: *Deleted

## 2013-03-13 DIAGNOSIS — M1710 Unilateral primary osteoarthritis, unspecified knee: Secondary | ICD-10-CM | POA: Diagnosis not present

## 2013-03-16 ENCOUNTER — Encounter: Payer: Self-pay | Admitting: *Deleted

## 2013-03-20 DIAGNOSIS — M1710 Unilateral primary osteoarthritis, unspecified knee: Secondary | ICD-10-CM | POA: Diagnosis not present

## 2013-03-23 ENCOUNTER — Other Ambulatory Visit: Payer: Self-pay | Admitting: Critical Care Medicine

## 2013-03-27 ENCOUNTER — Encounter: Payer: Self-pay | Admitting: *Deleted

## 2013-03-27 ENCOUNTER — Other Ambulatory Visit: Payer: Self-pay | Admitting: Cardiovascular Disease

## 2013-03-27 DIAGNOSIS — M1710 Unilateral primary osteoarthritis, unspecified knee: Secondary | ICD-10-CM | POA: Diagnosis not present

## 2013-03-28 ENCOUNTER — Other Ambulatory Visit: Payer: Self-pay

## 2013-03-28 DIAGNOSIS — Z1231 Encounter for screening mammogram for malignant neoplasm of breast: Secondary | ICD-10-CM

## 2013-03-28 NOTE — Telephone Encounter (Signed)
Please advise 

## 2013-04-04 ENCOUNTER — Ambulatory Visit (INDEPENDENT_AMBULATORY_CARE_PROVIDER_SITE_OTHER): Payer: Medicare Other | Admitting: Pharmacist Clinician (PhC)/ Clinical Pharmacy Specialist

## 2013-04-04 VITALS — BP 122/82 | HR 84 | Ht 67.0 in | Wt 204.8 lb

## 2013-04-04 DIAGNOSIS — Z7901 Long term (current) use of anticoagulants: Secondary | ICD-10-CM

## 2013-04-04 DIAGNOSIS — I4891 Unspecified atrial fibrillation: Secondary | ICD-10-CM

## 2013-04-07 ENCOUNTER — Other Ambulatory Visit: Payer: Self-pay | Admitting: Family Medicine

## 2013-04-10 ENCOUNTER — Encounter: Payer: Medicare Other | Admitting: *Deleted

## 2013-04-19 ENCOUNTER — Ambulatory Visit (INDEPENDENT_AMBULATORY_CARE_PROVIDER_SITE_OTHER): Payer: Medicare Other | Admitting: Critical Care Medicine

## 2013-04-19 ENCOUNTER — Encounter: Payer: Self-pay | Admitting: Critical Care Medicine

## 2013-04-19 VITALS — BP 124/84 | HR 79 | Temp 98.5°F | Ht 67.0 in | Wt 203.6 lb

## 2013-04-19 DIAGNOSIS — J45909 Unspecified asthma, uncomplicated: Secondary | ICD-10-CM

## 2013-04-19 NOTE — Patient Instructions (Signed)
No change in medications. Return in         4 months 

## 2013-04-19 NOTE — Progress Notes (Signed)
Subjective:    Patient ID: [name], female    DOB: 04/04/1951, 63 y.o.   MRN: [id number]  HPI 63 y.o.  AAF with known hx of Asthma , VCD, CHF and Atrial fib on chronic coumadin    04/19/2013 Chief Complaint  Patient presents with  . 4 month follow up    Breathing doing well overall.  No SOB, wheezing, chest tightness/pain, or cough at this time.  Pt doing well so far.  No real cough.  No chest pain. No wheezing.  No heartburn.  Pt denies any significant sore throat, nasal congestion or excess secretions, fever, chills, sweats, unintended weight loss, pleurtic or exertional chest pain, orthopnea PND, or leg swelling Pt denies any increase in rescue therapy over baseline, denies waking up needing it or having any early am or nocturnal exacerbations of coughing/wheezing/or dyspnea. Pt also denies any obvious fluctuation in symptoms with  weather or environmental change or other alleviating or aggravating factors    Past Medical History  Diagnosis Date  . Atrial fibrillation     ablation x 2 [facility], 01/2006, 2011.  on warfarin  . Hyperlipidemia   . Depression   . Anxiety   . Diabetes mellitus   . Anemia   . Arthritis   . GERD (gastroesophageal reflux disease)   . Sleep apnea   . Vocal cord disease   . CHF (congestive heart failure)     Echo 08/08/10 by [facility]. EF 35-45%. LV systolic function moderately reduced. Moderate global hypokinesis of LV.  RV systolic function moderately reduced. Mild MR. Trace AR.  Marland Kitchen. Cardiac arrest jan 2012    in hospital for pneumonia when this occured- occured at the hospital  . GASTROESOPHAGEAL REFLUX, NO ESOPHAGITIS 06/10/2006    Qualifier: Diagnosis of  By: Abundio MiuMcGregor, Barbara    . Asthma     has had multiple hospitalizations for this  . Non-ischemic cardiomyopathy     echo 08/08/10 - EF 35-45% LV and RV systolic function mod reduced  . Limb pain 06/04/2008    LLE, Baker's cyst in popliteal fossa, no DVT     Family History  Problem  Relation Age of Onset  . Diabetes Father   . Hypertension Father      History   Social History  . Marital Status: Widowed    Spouse Name: N/A    Number of Children: N/A  . Years of Education: N/A   Occupational History  . Not on file.   Social History Main Topics  . Smoking status: Never Smoker   . Smokeless tobacco: Never Used  . Alcohol Use: No  . Drug Use: No  . Sexual Activity: No   Other Topics Concern  . Not on file   Social History Narrative   Not employed   Exercise- walking 1 hour daily.    Diet- eating healthy diet.      Allergies  Allergen Reactions  . Avelox [Moxifloxacin Hcl In Nacl]     Cardiac arrest per pt  . Simvastatin Other (See Comments)    myalgias  . Ace Inhibitors Cough    Dry cough      Outpatient Prescriptions Prior to Visit  Medication Sig Dispense Refill  . B-D ULTRAFINE III SHORT PEN 31G X 8 MM MISC use as directed  100 each  9  . beclomethasone (QVAR) 40 MCG/ACT inhaler Inhale 2 puffs into the lungs 2 (two) times daily.  1 Inhaler  12  . buPROPion [facility](WELLBUTRIN  XL) 150 MG 24 hr tablet take 1 tablet by mouth once daily  30 tablet  8  . chlorpheniramine (CHLOR-TRIMETON) 4 MG tablet 8mg  at night  14 tablet  0  . digoxin (LANOXIN) 0.125 MG tablet Take 1/2 tablet by mouth daily  15 tablet  9  . dofetilide (TIKOSYN) 250 MCG capsule Take 250 mcg by mouth 2 (two) times daily.      . fluticasone (FLONASE) 50 MCG/ACT nasal spray instill 1 spray into each nostril twice a day  16 g  5  . glipiZIDE (GLUCOTROL) 5 MG tablet take 1 tablet by mouth twice a day before meals  120 tablet  3  . levalbuterol (XOPENEX HFA) 45 MCG/ACT inhaler Inhale 1-2 puffs into the lungs every 4 (four) hours as needed for wheezing.  1 Inhaler  0  . levalbuterol (XOPENEX) 0.63 MG/3ML nebulizer solution one in nebulizer four times daily as needed per Dr Delford Field  360 mL  4  . losartan (COZAAR) 50 MG tablet take 1 tablet by mouth once daily  90 tablet  3  . metoprolol  (LOPRESSOR) 50 MG tablet take 1 tablet by mouth twice a day  60 tablet  11  . NEXIUM 40 MG capsule take 1 capsule by mouth once daily  30 capsule  5  . polyethylene glycol powder (GLYCOLAX/MIRALAX) powder take 17GM (DISSOLVED IN WATER) by mouth once daily  255 g  5  . RA LORATADINE 10 MG tablet take 1 tablet by mouth once daily  30 tablet  3  . spironolactone (ALDACTONE) 25 MG tablet take 1/2 tablet by mouth once daily  30 tablet  6  . torsemide (DEMADEX) 10 MG tablet take 2 tablets by mouth once daily  60 tablet  5  . warfarin (COUMADIN) 5 MG tablet Take 1 & 1/2 tablets by mouth daily or as directed  45 tablet  5  . Insulin Glargine (LANTUS SOLOSTAR) 100 UNIT/ML SOPN Inject 25 Units into the skin at bedtime.  15 mL  11  . LIDODERM 5 % apply 1 patch to the affected area daily. Leave on for 12 hours and then off for 12 hours.  30 patch  0  . gabapentin (NEURONTIN) 100 MG capsule Take 1 capsule (100 mg total) by mouth 3 (three) times daily.  90 capsule  3   No facility-administered medications prior to visit.      Review of Systems  Constitutional:   No  weight loss, night sweats,  Fevers, chills, fatigue, lassitude. HEENT:   No headaches,  Difficulty swallowing,  Tooth/dental problems,  Sore throat,                No sneezing, itching, ear ache, nasal congestion, + post nasal drip,   CV:  No chest pain,  Orthopnea, PND, swelling in lower extremities, anasarca, dizziness, palpitations  GI  No heartburn, indigestion, abdominal pain, nausea, vomiting, diarrhea, change in bowel habits, loss of appetite  Resp:   No coughing up of blood. Marland Kitchen  No chest wall deformity  Skin: no rash or lesions.  GU: no dysuria, change in color of urine, no urgency or frequency.  No flank pain.  MS:  No joint pain or swelling.  No decreased range of motion.  No back pain.  Psych:  No change in mood or affect. No depression or anxiety.  No memory loss.     Objective:   Physical Exam  Filed Vitals:    04/19/13 0923  BP: 124/84  Pulse: 79  Temp: 98.5 F (36.9 C)  TempSrc: Oral  Height: 5\' 7"  (1.702 m)  Weight: 92.352 kg (203 lb 9.6 oz)  SpO2: 99%    Gen: Pleasant, well-nourished, in no distress,  normal affect  ENT: No lesions,  mouth clear,  oropharynx clear, no postnasal drip  Neck: No JVD, no TMG, no carotid bruits  Lungs: no wheezing, good airflow, speaking in full sentences,resolved pseudowheeze  Cardiovascular: RRR, heart sounds normal, no murmur or gallops, no peripheral edema  Abdomen: soft and NT, no HSM,  BS normal  Musculoskeletal: No deformities, no cyanosis or clubbing  Neuro: alert, non focal  Skin: Warm, no lesions or rashes  No results found.      Assessment & Plan:   Moderate persistent asthma with significant atopic features Stable asthma and VCD Plan No change in inhaled or maintenance medications. Return in  4 months    Updated Medication List Outpatient Encounter Prescriptions as of 04/19/2013  Medication Sig  . B-D ULTRAFINE III SHORT PEN 31G X 8 MM MISC use as directed  . beclomethasone (QVAR) 40 MCG/ACT inhaler Inhale 2 puffs into the lungs 2 (two) times daily.  Marland Kitchen buPROPion (WELLBUTRIN XL) 150 MG 24 hr tablet take 1 tablet by mouth once daily  . chlorpheniramine (CHLOR-TRIMETON) 4 MG tablet 8mg  at night  . digoxin (LANOXIN) 0.125 MG tablet Take 1/2 tablet by mouth daily  . dofetilide (TIKOSYN) 250 MCG capsule Take 250 mcg by mouth 2 (two) times daily.  . fluticasone (FLONASE) 50 MCG/ACT nasal spray instill 1 spray into each nostril twice a day  . glipiZIDE (GLUCOTROL) 5 MG tablet take 1 tablet by mouth twice a day before meals  . Insulin Glargine (LANTUS) 100 UNIT/ML Solostar Pen Inject 30 Units into the skin at bedtime.  . levalbuterol (XOPENEX HFA) 45 MCG/ACT inhaler Inhale 1-2 puffs into the lungs every 4 (four) hours as needed for wheezing.  . levalbuterol (XOPENEX) 0.63 MG/3ML nebulizer solution one in nebulizer four times daily as  needed per Dr Delford Field  . lidocaine (LIDODERM) 5 % apply 1 patch to the affected area daily as needed Leave on for 12 hours and then off for 12 hours.  Marland Kitchen losartan (COZAAR) 50 MG tablet take 1 tablet by mouth once daily  . metoprolol (LOPRESSOR) 50 MG tablet take 1 tablet by mouth twice a day  . NEXIUM 40 MG capsule take 1 capsule by mouth once daily  . polyethylene glycol powder (GLYCOLAX/MIRALAX) powder take 17GM (DISSOLVED IN WATER) by mouth once daily  . RA LORATADINE 10 MG tablet take 1 tablet by mouth once daily  . spironolactone (ALDACTONE) 25 MG tablet take 1/2 tablet by mouth once daily  . torsemide (DEMADEX) 10 MG tablet take 2 tablets by mouth once daily  . warfarin (COUMADIN) 5 MG tablet Take 1 & 1/2 tablets by mouth daily or as directed  . [DISCONTINUED] Insulin Glargine (LANTUS SOLOSTAR) 100 UNIT/ML SOPN Inject 25 Units into the skin at bedtime.  . [DISCONTINUED] LIDODERM 5 % apply 1 patch to the affected area daily. Leave on for 12 hours and then off for 12 hours.  . [DISCONTINUED] gabapentin (NEURONTIN) 100 MG capsule Take 1 capsule (100 mg total) by mouth 3 (three) times daily.

## 2013-04-20 NOTE — Assessment & Plan Note (Signed)
Stable asthma and VCD Plan No change in inhaled or maintenance medications. Return in  4 months

## 2013-04-25 ENCOUNTER — Ambulatory Visit (INDEPENDENT_AMBULATORY_CARE_PROVIDER_SITE_OTHER): Payer: Medicare Other | Admitting: Pharmacist Clinician (PhC)/ Clinical Pharmacy Specialist

## 2013-04-25 VITALS — BP 122/84 | HR 72

## 2013-04-25 DIAGNOSIS — Z7901 Long term (current) use of anticoagulants: Secondary | ICD-10-CM | POA: Diagnosis not present

## 2013-04-25 DIAGNOSIS — Z5181 Encounter for therapeutic drug level monitoring: Secondary | ICD-10-CM

## 2013-04-25 DIAGNOSIS — I4891 Unspecified atrial fibrillation: Secondary | ICD-10-CM

## 2013-04-25 LAB — POCT INR: INR: 2.1

## 2013-05-04 ENCOUNTER — Ambulatory Visit
Admission: RE | Admit: 2013-05-04 | Discharge: 2013-05-04 | Disposition: A | Discharge status: Home / Self Care | Payer: Medicare Other | Source: Ambulatory Visit

## 2013-05-04 DIAGNOSIS — Z1231 Encounter for screening mammogram for malignant neoplasm of breast: Secondary | ICD-10-CM

## 2013-05-09 ENCOUNTER — Other Ambulatory Visit: Payer: Self-pay | Admitting: Family Medicine

## 2013-05-23 ENCOUNTER — Ambulatory Visit: Payer: Medicare Other | Admitting: Pharmacist Clinician (PhC)/ Clinical Pharmacy Specialist

## 2013-05-23 ENCOUNTER — Ambulatory Visit (INDEPENDENT_AMBULATORY_CARE_PROVIDER_SITE_OTHER): Payer: Medicare Other | Admitting: Cardiovascular Disease

## 2013-05-23 ENCOUNTER — Ambulatory Visit (INDEPENDENT_AMBULATORY_CARE_PROVIDER_SITE_OTHER): Payer: Medicare Other | Admitting: Pharmacist Clinician (PhC)/ Clinical Pharmacy Specialist

## 2013-05-23 ENCOUNTER — Encounter: Payer: Self-pay | Admitting: Cardiovascular Disease

## 2013-05-23 VITALS — BP 100/60 | HR 84 | Resp 16 | Ht 67.0 in | Wt 201.9 lb

## 2013-05-23 DIAGNOSIS — Z95810 Presence of automatic (implantable) cardiac defibrillator: Secondary | ICD-10-CM

## 2013-05-23 DIAGNOSIS — I4891 Unspecified atrial fibrillation: Secondary | ICD-10-CM

## 2013-05-23 DIAGNOSIS — I5022 Chronic systolic (congestive) heart failure: Secondary | ICD-10-CM | POA: Diagnosis not present

## 2013-05-23 DIAGNOSIS — Z5181 Encounter for therapeutic drug level monitoring: Secondary | ICD-10-CM

## 2013-05-23 DIAGNOSIS — N182 Chronic kidney disease, stage 2 (mild): Secondary | ICD-10-CM

## 2013-05-23 DIAGNOSIS — Z79899 Other long term (current) drug therapy: Secondary | ICD-10-CM

## 2013-05-23 DIAGNOSIS — R5383 Other fatigue: Secondary | ICD-10-CM

## 2013-05-23 DIAGNOSIS — Z7901 Long term (current) use of anticoagulants: Secondary | ICD-10-CM | POA: Diagnosis not present

## 2013-05-23 DIAGNOSIS — R5381 Other malaise: Secondary | ICD-10-CM

## 2013-05-23 DIAGNOSIS — G4730 Sleep apnea, unspecified: Secondary | ICD-10-CM

## 2013-05-23 DIAGNOSIS — E782 Mixed hyperlipidemia: Secondary | ICD-10-CM | POA: Diagnosis not present

## 2013-05-23 DIAGNOSIS — I4581 Long QT syndrome: Secondary | ICD-10-CM

## 2013-05-23 LAB — MDC_IDC_ENUM_SESS_TYPE_INCLINIC
Battery Voltage: 3.08 V
Brady Statistic AP VP Percent: 0.86 %
Brady Statistic AS VP Percent: 0.04 %
Brady Statistic RV Percent Paced: 0.9 %
HIGH POWER IMPEDANCE MEASURED VALUE: 190 Ohm
HighPow Impedance: 361 Ohm
HighPow Impedance: 73 Ohm
Lead Channel Impedance Value: 456 Ohm
Lead Channel Impedance Value: 456 Ohm
Lead Channel Pacing Threshold Amplitude: 0.375 V
Lead Channel Pacing Threshold Pulse Width: 0.4 ms
Lead Channel Pacing Threshold Pulse Width: 0.4 ms
Lead Channel Sensing Intrinsic Amplitude: 1.125 mV
Lead Channel Sensing Intrinsic Amplitude: 1.75 mV
Lead Channel Sensing Intrinsic Amplitude: 12 mV
Lead Channel Sensing Intrinsic Amplitude: 17.625 mV
Lead Channel Setting Pacing Amplitude: 2.5 V
Lead Channel Setting Pacing Pulse Width: 0.4 ms
Lead Channel Setting Sensing Sensitivity: 0.3 mV
MDC IDC MSMT LEADCHNL RV PACING THRESHOLD AMPLITUDE: 0.625 V
MDC IDC SESS DTM: 20150210113139
MDC IDC SET LEADCHNL RA PACING AMPLITUDE: 2 V
MDC IDC STAT BRADY AP VS PERCENT: 95.45 %
MDC IDC STAT BRADY AS VS PERCENT: 3.66 %
MDC IDC STAT BRADY RA PERCENT PACED: 96.3 %
Zone Setting Detection Interval: 270 ms
Zone Setting Detection Interval: 330 ms
Zone Setting Detection Interval: 360 ms
Zone Setting Detection Interval: 400 ms

## 2013-05-23 LAB — POCT INR: INR: 2.3

## 2013-05-23 LAB — PACEMAKER DEVICE OBSERVATION

## 2013-05-23 NOTE — Assessment & Plan Note (Signed)
Insufficient compliance with CPAP may have something to do with her sleep disturbance. She is encouraged to use this on a regular basis. Also asked her to discuss whether she still needs treatment with Wellbutrin since this may interfere with sleep cycle as well. She'll discuss this with her primary care physician. I believe it was started preventatively to avoid depression following her cardiac arrest.

## 2013-05-23 NOTE — Patient Instructions (Addendum)
Your physician recommends that you return for lab work in: At [employer] at [employer].   Remote monitoring is used to monitor your ICD from home. This monitoring reduces the number of office visits required to check your device to one time per year. It allows [place] to keep an eye on the functioning of your device to ensure it is working properly. You are scheduled for a device check from home on 08-24-2013. You may send your transmission at any time that day. If you have a wireless device, the transmission will be sent automatically. After your physician reviews your transmission, you will receive a postcard with your next transmission date.  Your physician recommends that you schedule a follow-up appointment in: 6 months with Dr.Croitoru

## 2013-05-23 NOTE — Assessment & Plan Note (Signed)
Normal device function. All the parameters are in the desirable range. Atrial pacing occurs roughly 97% of the time and ventricular pacing occurs less than 1% of the time. Heart rate histogram distribution is favorable. No permanent changes are made to device settings.

## 2013-05-23 NOTE — Assessment & Plan Note (Signed)
The most recent creatinine level that I have available was 1.03 with an estimated GFR of about 64 mL per minute. Need to monitor renal function periodically to avoid dofetilide toxicity.

## 2013-05-23 NOTE — Assessment & Plan Note (Addendum)
Despite 2 previous ablation procedures and treatment with dofetilide, she continues to have atrial fibrillation burden of roughly 1%. Ventricular rate control on the current regimen of medications is good with average ventricular rates in the 9215 beats per minute. The faster ventricular rates generally occur during the very brief episodes of atrial for relation. During her most recent 2 hour episode of atrial fibrillation the average ventricular rate was 96 beats per minutes. I reinforced the importance of lifelong warfarin anticoagulation barring need for brief interruption for surgery or bleeding complications. Also reminded her about the potential for drug interactions that can be quite dangerous. She is to avoid QT prolonging drugs and is also reminded of the need for warfarin monitoring if medications are changed.

## 2013-05-23 NOTE — Progress Notes (Signed)
Patient ID: ADILENY DELON, female   DOB: 08/16/1950, 64 y.o.   MRN: [id number]      Reason for office visit Nonischemic cardiomyopathy, history of VF arrest, systolic heart failure, paroxysmal atrial fibrillation  Mrs. Winfree returns for routine followup. She has a history of moderate nonischemic cardiomyopathy (no coronary disease by cardiac catheterization January 2012), atrial fibrillation status post 2 ablation procedures (Dr. Orson Aloe), with history of ventricular fibrillation arrest while on treatment with diltiazem and dofetilide and a prolonged QT interval, probably related to simultaneous quinolone therapy.  She has no complaints. She has frequent, but relatively brief episodes of paroxysmal atrial fibrillation that are asymptomatic. She is on anticoagulation therapy. She has not had any focal neurological problems or bleeding complications.  Defibrillator check shows no episodes of ventricular arrhythmia. She continues to have an atrial fibrillation burden of roughly 1%. Most of the episodes last for just a few minutes although she has occasional episodes lasting between 1-2 hours including one that occurred last night at 3 AM. She is rarely aware of the regular heart rhythm She has not had any episodes of overt heart failure decompensation since her last appointment. She remains quite active, her ICD shows at least 4 as her activity in a consistent fashion every day. She complains of difficulty staying asleep at night.  Allergies  Allergen Reactions  . Avelox [Moxifloxacin Hcl In Nacl]     Cardiac arrest per pt  . Simvastatin Other (See Comments)    myalgias  . Ace Inhibitors Cough    Dry cough     Current Outpatient Prescriptions  Medication Sig Dispense Refill  . B-D ULTRAFINE III SHORT PEN 31G X 8 MM MISC use as directed  100 each  9  . beclomethasone (QVAR) 40 MCG/ACT inhaler Inhale 2 puffs into the lungs 2 (two) times daily.  1 Inhaler  12  . buPROPion (WELLBUTRIN XL) 150  MG 24 hr tablet take 1 tablet by mouth once daily  30 tablet  8  . chlorpheniramine (CHLOR-TRIMETON) 4 MG tablet 8mg  at night  14 tablet  0  . digoxin (LANOXIN) 0.125 MG tablet Take 1/2 tablet by mouth daily  15 tablet  9  . dofetilide (TIKOSYN) 250 MCG capsule Take 250 mcg by mouth 2 (two) times daily.      . fluticasone (FLONASE) 50 MCG/ACT nasal spray instill 1 spray into each nostril twice a day  16 g  5  . glipiZIDE (GLUCOTROL) 5 MG tablet take 1 tablet by mouth twice a day before meals  120 tablet  3  . Insulin Glargine (LANTUS) 100 UNIT/ML Solostar Pen Inject 30 Units into the skin at bedtime.      . levalbuterol (XOPENEX HFA) 45 MCG/ACT inhaler Inhale 1-2 puffs into the lungs every 4 (four) hours as needed for wheezing.  1 Inhaler  0  . levalbuterol (XOPENEX) 0.63 MG/3ML nebulizer solution one in nebulizer four times daily as needed per Dr Delford Field  360 mL  4  . lidocaine (LIDODERM) 5 % apply 1 patch to the affected area daily as needed Leave on for 12 hours and then off for 12 hours.      Marland Kitchen losartan (COZAAR) 50 MG tablet take 1 tablet by mouth once daily  90 tablet  3  . metoprolol (LOPRESSOR) 50 MG tablet take 1 tablet by mouth twice a day  60 tablet  11  . NEXIUM 40 MG capsule take 1 capsule by mouth once daily  30 capsule  5  . polyethylene glycol powder (GLYCOLAX/MIRALAX) powder take 17GM (DISSOLVED IN WATER) by mouth once daily  255 g  5  . RA LORATADINE 10 MG tablet take 1 tablet by mouth once daily  30 tablet  3  . spironolactone (ALDACTONE) 25 MG tablet take 1/2 tablet by mouth once daily  30 tablet  6  . torsemide (DEMADEX) 10 MG tablet take 2 tablets by mouth once daily  60 tablet  5  . warfarin (COUMADIN) 5 MG tablet Take 1 & 1/2 tablets by mouth daily or as directed  45 tablet  5   No current facility-administered medications for this visit.    Past Medical History  Diagnosis Date  . Atrial fibrillation     ablation x 2 [facility], 01/2006, 2011.  on warfarin  . Hyperlipidemia     . Depression   . Anxiety   . Diabetes mellitus   . Anemia   . Arthritis   . GERD (gastroesophageal reflux disease)   . Sleep apnea   . Vocal cord disease   . CHF (congestive heart failure)     Echo 08/08/10 by [facility]. EF 35-45%. LV systolic function moderately reduced. Moderate global hypokinesis of LV.  RV systolic function moderately reduced. Mild MR. Trace AR.  Marland Kitchen Cardiac arrest jan 2012    in hospital for pneumonia when this occured- occured at the hospital  . GASTROESOPHAGEAL REFLUX, NO ESOPHAGITIS 06/10/2006    Qualifier: Diagnosis of  By: Abundio Miu    . Asthma     has had multiple hospitalizations for this  . Non-ischemic cardiomyopathy     echo 08/08/10 - EF 35-45% LV and RV systolic function mod reduced  . Limb pain 06/04/2008    LLE, Baker's cyst in popliteal fossa, no DVT    Past Surgical History  Procedure Laterality Date  . Cholecystectomy    . Knee arthroscopy    . Cardiac defibrillator placement  05/02/10    Medtronic Protecta XT-DR for CHF-VT, last download 04/12/12  . Paf ablation      By Dr Sampson Goon. Now sees Dr Steele Berg at [facility] History  Problem Relation Age of Onset  . Diabetes Father   . Hypertension Father     History   Social History  . Marital Status: Widowed    Spouse Name: N/A    Number of Children: N/A  . Years of Education: N/A   Occupational History  . Not on file.   Social History Main Topics  . Smoking status: Never Smoker   . Smokeless tobacco: Never Used  . Alcohol Use: No  . Drug Use: No  . Sexual Activity: No   Other Topics Concern  . Not on file   Social History Narrative   Not employed   Exercise- walking 1 hour daily.    Diet- eating healthy diet.     Review of systems: The patient specifically denies any chest pain at rest or with exertion, dyspnea at rest or with exertion, orthopnea, paroxysmal nocturnal dyspnea, syncope, palpitations, focal neurological deficits, intermittent  claudication, lower extremity edema, unexplained weight gain, cough, hemoptysis or wheezing.  The patient also denies abdominal pain, nausea, vomiting, dysphagia, diarrhea, constipation, polyuria, polydipsia, dysuria, hematuria, frequency, urgency, abnormal bleeding or bruising, fever, chills, unexpected weight changes, mood swings, change in skin or hair texture, change in voice quality, auditory or visual problems, allergic reactions or rashes, new musculoskeletal complaints other than usual "aches and pains".  PHYSICAL EXAM BP 100/60  Pulse 84  Resp 16  Ht 5\' 7"  (1.702 m)  Wt 91.581 kg (201 lb 14.4 oz)  BMI 31.61 kg/m2  LMP 07/26/2011 General: Alert, oriented x3, no distress, mildly obese  Head: no evidence of trauma, PERRL, EOMI, no exophtalmos or lid lag, no myxedema, no xanthelasma; normal ears, nose and oropharynx  Neck: normal jugular venous pulsations and no hepatojugular reflux; brisk carotid pulses without delay and no carotid bruits  Chest: clear to auscultation, no signs of consolidation by percussion or palpation, normal fremitus, symmetrical and full respiratory excursions, healthy appearance of the ICD site  Cardiovascular: normal position and quality of the apical impulse, regular rhythm, normal first and second heart sounds, no murmurs, rubs or gallops  Abdomen: no tenderness or distention, no masses by palpation, no abnormal pulsatility or arterial bruits, normal bowel sounds, no hepatosplenomegaly  Extremities: no clubbing, cyanosis or edema; 2+ radial, ulnar and brachial pulses bilaterally; 2+ right femoral, posterior tibial and dorsalis pedis pulses; 2+ left femoral, posterior tibial and dorsalis pedis pulses; no subclavian or femoral bruits  Neurological: grossly nonfocal   EKG: Atrial paced ventricular sensed, mildly prolonged QT interval at 479 ms, consistent with dofetilide effect, no ischemic changes.    Lipid Panel     Component Value Date/Time   CHOL 221*  12/13/2012 0924   TRIG 127 12/13/2012 0924   HDL 49 12/13/2012 0924   CHOLHDL 4.5 12/13/2012 0924   VLDL 25 12/13/2012 0924   LDLCALC 147* 12/13/2012 0924    BMET    Component Value Date/Time   NA 136 05/16/2012 0902   K 4.4 05/16/2012 0902   CL 99 05/16/2012 0902   CO2 29 05/16/2012 0902   GLUCOSE 248* 05/16/2012 0902   BUN 15 05/16/2012 0902   CREATININE 1.03 05/16/2012 0902   CREATININE 1.07 07/26/2011 1123   CALCIUM 9.2 05/16/2012 0902   GFRNONAA 55* 07/26/2011 1123   GFRAA 64* 07/26/2011 1123     ASSESSMENT AND PLAN Atrial fibrillation Despite 2 previous ablation procedures and treatment with dofetilide, she continues to have atrial fibrillation burden of roughly 1%. Ventricular rate control on the current regimen of medications is good with average ventricular rates in the 9215 beats per minute. The faster ventricular rates generally occur during the very brief episodes of atrial for relation. During her most recent 2 hour episode of atrial fibrillation the average ventricular rate was 96 beats per minutes. I reinforced the importance of lifelong warfarin anticoagulation barring need for brief interruption for surgery or bleeding complications. Also reminded her about the potential for drug interactions that can be quite dangerous. She is to avoid QT prolonging drugs and is also reminded of the need for warfarin monitoring if medications are changed.  CKD (chronic kidney disease) stage 2, GFR 60-89 ml/min The most recent creatinine level that I have available was 1.03 with an estimated GFR of about 64 mL per minute. Need to monitor renal function periodically to avoid dofetilide toxicity.  ICD ([employer] Protecta dual-chamber),secondary prevention (VF arrest) January 2012 Normal device function. All the parameters are in the desirable range. Atrial pacing occurs roughly 97% of the time and ventricular pacing occurs less than 1% of the time. Heart rate histogram distribution is favorable. No permanent  changes are made to device settings.  APNEA, SLEEP Insufficient compliance with CPAP may have something to do with her sleep disturbance. She is encouraged to use this on a regular basis. Also asked her to discuss  whether she still needs treatment with Wellbutrin since this may interfere with sleep cycle as well. She'll discuss this with her primary care physician. I believe it was started preventatively to avoid depression following her cardiac arrest.   Orders Placed This Encounter  Procedures  . Comprehensive metabolic panel  . Lipid panel  . CBC  . EKG 12-Lead   No orders of the defined types were placed in this encounter.    Junious Silk, MD, [facility] [phone] office [phone] pager

## 2013-05-29 ENCOUNTER — Other Ambulatory Visit: Payer: Self-pay | Admitting: *Deleted

## 2013-05-29 DIAGNOSIS — Z79899 Other long term (current) drug therapy: Secondary | ICD-10-CM | POA: Diagnosis not present

## 2013-05-29 DIAGNOSIS — R5381 Other malaise: Secondary | ICD-10-CM | POA: Diagnosis not present

## 2013-05-29 DIAGNOSIS — E782 Mixed hyperlipidemia: Secondary | ICD-10-CM | POA: Diagnosis not present

## 2013-05-29 DIAGNOSIS — R5383 Other fatigue: Secondary | ICD-10-CM | POA: Diagnosis not present

## 2013-05-29 LAB — LIPID PANEL
CHOL/HDL RATIO: 4.4 ratio
CHOLESTEROL: 204 mg/dL — AB (ref 0–200)
HDL: 46 mg/dL (ref >39)
LDL CALC: 136 mg/dL — AB (ref 0–99)
Triglycerides: 108 mg/dL (ref <150)
VLDL: 22 mg/dL (ref 0–40)

## 2013-05-29 LAB — CBC
HCT: 37.8 % (ref 36.0–46.0)
Hemoglobin: 12.6 g/dL (ref 12.0–15.0)
MCH: 28.5 pg (ref 26.0–34.0)
MCHC: 33.3 g/dL (ref 30.0–36.0)
MCV: 85.5 fL (ref 78.0–100.0)
Platelets: 259 10*3/uL (ref 150–400)
RBC: 4.42 MIL/uL (ref 3.87–5.11)
RDW: 14.1 % (ref 11.5–15.5)
WBC: 5.3 10*3/uL (ref 4.0–10.5)

## 2013-05-29 LAB — COMPREHENSIVE METABOLIC PANEL
ALBUMIN: 3.8 g/dL (ref 3.5–5.2)
ALT: 19 U/L (ref 0–35)
AST: 17 U/L (ref 0–37)
Alkaline Phosphatase: 98 U/L (ref 39–117)
BILIRUBIN TOTAL: 0.4 mg/dL (ref 0.2–1.2)
BUN: 10 mg/dL (ref 6–23)
CALCIUM: 8.7 mg/dL (ref 8.4–10.5)
CHLORIDE: 103 meq/L (ref 96–112)
CO2: 28 meq/L (ref 19–32)
Creat: 0.96 mg/dL (ref 0.50–1.10)
GLUCOSE: 128 mg/dL — AB (ref 70–99)
POTASSIUM: 4.5 meq/L (ref 3.5–5.3)
SODIUM: 138 meq/L (ref 135–145)
TOTAL PROTEIN: 7 g/dL (ref 6.0–8.3)

## 2013-05-29 MED ORDER — TORSEMIDE 10 MG PO TABS: ORAL_TABLET | ORAL | Status: DC

## 2013-06-01 ENCOUNTER — Telehealth: Payer: Self-pay | Admitting: *Deleted

## 2013-06-01 DIAGNOSIS — Z79899 Other long term (current) drug therapy: Secondary | ICD-10-CM

## 2013-06-01 DIAGNOSIS — E782 Mixed hyperlipidemia: Secondary | ICD-10-CM

## 2013-06-01 MED ORDER — PRAVASTATIN SODIUM 40 MG PO TABS: 40.0000 mg | ORAL_TABLET | Freq: Every evening | ORAL | Status: DC

## 2013-06-01 NOTE — Telephone Encounter (Signed)
Lab results called to patient.  Instructed to start Pravastatin 40mg  qpm.  Patient voiced understanding.  Rx called to pharmacy #[id number] w/3 refills.  Lab order mailed to patient to have rechecked in 3 months.

## 2013-06-01 NOTE — Telephone Encounter (Signed)
Message copied by Vita Barley on Thu Jun 01, 2013  9:24 AM ------      Message from: Thurmon Fair      Created: Tue May 30, 2013  4:41 PM       Cholesterol and especially LDL are too high. She could not tolerate simvastatin; try pravastatin 40 mg (important to take in the evening) daily and recheck in 3 months ------

## 2013-06-05 ENCOUNTER — Telehealth: Payer: Self-pay | Admitting: *Deleted

## 2013-06-05 NOTE — Telephone Encounter (Signed)
Returned call and pt verified x 2.  Pt stated Dr. Royann Shivers gave her some cholesterol medicine and it is making her sick on the stomach.  Pt c/o nausea only.  Denied other symptoms and stated she takes it at 6 pm every evening after dinner.  Pt advised to try taking it about 30 mins before dinner for a few days to see how that does and call back if no change.  Pt not completely agreeable w/ plan.   Phillips Hay, PharmD notified and advised pt take 1/2 tab just before meals for 8 days and then increase to full tab if improved.    Returned call and informed pt per instructions by PharmD.  Pt verbalized understanding and agreed w/ plan.

## 2013-06-05 NOTE — Telephone Encounter (Signed)
Pt was given Pravastatin and she is experiencing nausea and wanted to know what to do.  MC

## 2013-06-05 NOTE — Telephone Encounter (Signed)
Agree with advice given

## 2013-06-09 ENCOUNTER — Telehealth: Payer: Self-pay | Admitting: Cardiovascular Disease

## 2013-06-09 MED ORDER — ROSUVASTATIN CALCIUM 10 MG PO TABS: 10.0000 mg | ORAL_TABLET | Freq: Every day | ORAL | Status: DC

## 2013-06-09 NOTE — Telephone Encounter (Signed)
Patient started on pravastatin on 06/01/13 after lipid results. Called in on 2/23 c/o nausea associated with this medication and was instructed to take 1/2 tab for 8 days then increase to whole tab if symptoms improved. Patient calling in stating that she has tried this since Monday but that nausea persists - this is patient's only reported SE of this medication. Patient has tried simvastatin in past (noted in allergy section). Informed patient that Dr. Royann Shivers and PA in office today will be notified of her complaints

## 2013-06-09 NOTE — Telephone Encounter (Signed)
Stop pravastatin. See if she can come in to get samples of Crestor 10 mg once daily. Try this for 3-4 weeks before we call here in a prescription if medication is tolerated.

## 2013-06-09 NOTE — Telephone Encounter (Signed)
Please call Pravastatin is making her sick.

## 2013-06-09 NOTE — Telephone Encounter (Signed)
Returned call.  Left message to call back before 4pm.  

## 2013-06-09 NOTE — Telephone Encounter (Signed)
Returned call and informed pt per instructions by MD.  Pt verbalized understanding and agreed w/ plan.  Samples left at front desk.

## 2013-06-14 ENCOUNTER — Ambulatory Visit (INDEPENDENT_AMBULATORY_CARE_PROVIDER_SITE_OTHER): Payer: Medicare Other | Admitting: Family Medicine

## 2013-06-14 VITALS — BP 116/63 | HR 92 | Temp 98.0°F | Ht 67.0 in | Wt 201.0 lb

## 2013-06-14 DIAGNOSIS — E119 Type 2 diabetes mellitus without complications: Secondary | ICD-10-CM | POA: Diagnosis not present

## 2013-06-14 LAB — POCT GLYCOSYLATED HEMOGLOBIN (HGB A1C): HEMOGLOBIN A1C: 8.8

## 2013-06-14 NOTE — Progress Notes (Signed)
Family Medicine Office Visit Note   Subjective:   Patient ID: [name], female  DOB: 1951-01-07, 63 y.o.. MRN: [id number]   Pt that comes today for follow up her DM. She follows with cardiology regarding Afib, anticoagulation clinic, pacer/defibrilator monitoring, HTN, HLD and CHF.   DM: pt reports taking glipizide 10mg  and injecting Lantus 30 units and her CBG's have been in the 90-120's. Denies episodes or hypoglycemia or hyperglycemic symptoms of polyuria/polydipsia. Also denies noticeable side effects of medications she is on. Last ophthalmologic assessment was 12/2012.   Review of Systems:  Pt denies SOB, chest pain, palpitations, headaches, dizziness, numbness or weakness. No changes on urinary or BM habits. No unintentional weigh loss/gain.  Objective:   Physical Exam: Gen:  NAD HEENT: Moist mucous membranes  CV: Regular rate and rhythm, no murmurs rubs or gallops PULM: Clear to auscultation bilaterally. No wheezes/rales/rhonchi ABD: Soft, non tender, non distended, normal bowel sounds EXT: No edema Neuro: Alert and oriented x3. No focalization Foot exam: no lesions, intact sensation, reflexes and pulses are strong and symmetric.   Assessment & Plan:

## 2013-06-14 NOTE — Patient Instructions (Addendum)
No changes in your current treatment are recommended. F/u in 3-4 months

## 2013-06-15 NOTE — Assessment & Plan Note (Signed)
Last A1C was 10.9. Today 8.8 notable improvement. No change in insulin dose since pt's CBG's are adequate. We expect continue drop on her A1C to a goal below 7. Pt is also motivated in working on decreasing CHO intake. F/u in 3-4 months. Foot exam is wnl Next Opthalmology eval in 6 months.

## 2013-06-21 ENCOUNTER — Ambulatory Visit (INDEPENDENT_AMBULATORY_CARE_PROVIDER_SITE_OTHER): Payer: Medicare Other | Admitting: Pharmacist Clinician (PhC)/ Clinical Pharmacy Specialist

## 2013-06-21 VITALS — Wt 200.8 lb

## 2013-06-21 DIAGNOSIS — I4891 Unspecified atrial fibrillation: Secondary | ICD-10-CM | POA: Diagnosis not present

## 2013-06-21 DIAGNOSIS — Z7901 Long term (current) use of anticoagulants: Secondary | ICD-10-CM

## 2013-06-21 LAB — POCT INR: INR: 3.2

## 2013-06-22 ENCOUNTER — Telehealth: Payer: Self-pay | Admitting: *Deleted

## 2013-06-22 NOTE — Telephone Encounter (Signed)
Received Rx refill request for Gabapentin 100 mg Capsule #90 f rom [employer].  Medication is not listed on current med list.  Clovis Pu, RN

## 2013-07-07 ENCOUNTER — Ambulatory Visit (INDEPENDENT_AMBULATORY_CARE_PROVIDER_SITE_OTHER)
Admission: RE | Admit: 2013-07-07 | Discharge: 2013-07-07 | Disposition: A | Discharge status: Home / Self Care | Payer: Medicare Other | Source: Ambulatory Visit | Attending: Adult Health | Admitting: Adult Health

## 2013-07-07 ENCOUNTER — Ambulatory Visit (INDEPENDENT_AMBULATORY_CARE_PROVIDER_SITE_OTHER): Payer: Medicare Other | Admitting: Adult Health

## 2013-07-07 ENCOUNTER — Encounter: Payer: Self-pay | Admitting: Adult Health

## 2013-07-07 VITALS — BP 108/74 | HR 97 | Temp 97.6°F | Ht 67.0 in | Wt 204.8 lb

## 2013-07-07 DIAGNOSIS — J45909 Unspecified asthma, uncomplicated: Secondary | ICD-10-CM | POA: Diagnosis not present

## 2013-07-07 DIAGNOSIS — R05 Cough: Secondary | ICD-10-CM | POA: Diagnosis not present

## 2013-07-07 DIAGNOSIS — R062 Wheezing: Secondary | ICD-10-CM | POA: Diagnosis not present

## 2013-07-07 DIAGNOSIS — R059 Cough, unspecified: Secondary | ICD-10-CM | POA: Diagnosis not present

## 2013-07-07 MED ORDER — METHYLPREDNISOLONE ACETATE 80 MG/ML IJ SUSP
120.0000 mg | Freq: Once | INTRAMUSCULAR | Status: AC
  Administered 2013-07-07: 120 mg via INTRAMUSCULAR

## 2013-07-07 MED ORDER — HYDROCODONE-HOMATROPINE 5-1.5 MG/5ML PO SYRP: 5.0000 mL | ORAL_SOLUTION | Freq: Four times a day (QID) | ORAL | Status: DC | PRN

## 2013-07-07 NOTE — Assessment & Plan Note (Signed)
Flare with VCD decompensation in setting of Rhinitis flare  No abx at this time as appears to be allergic in nature Check cxr today  Depo medrol 120mg  IM x 1 (advised on steroid effects on BS)  Advised on trigger control and cough control   Plan  Delsym 2 tsp Twice daily  As needed  Cough  Chlortrimeton 4mg  1 in am and 2 At bedtime  As needed  Throat clearing /drainage  Hydromet 1-2 tsp every 4-6 hr As needed  Cough , may make you sleepy.  Chest xray today  Please contact office for sooner follow up if symptoms do not improve or worsen or seek emergency care  Follow up Dr. Delford Field  In 4-6 weeks and As needed

## 2013-07-07 NOTE — Progress Notes (Signed)
Subjective:    Patient ID: [name], female    DOB: 04/20/1950, 63 y.o.   MRN: [id number]  HPI 63 y.o.  AAF with known hx of Asthma , VCD, CHF and Atrial fib on chronic coumadin    04/19/2013 Chief Complaint  Patient presents with  . 4 month follow up    Breathing doing well overall.  No SOB, wheezing, chest tightness/pain, or cough at this time.  Pt doing well so far.  No real cough.  No chest pain. No wheezing.  No heartburn.   07/07/2013 Acute OV  Complains of  wheezing, dry cough, increased SOB, chest tightness onset this morning.  Started this am. Does have a lot of post nasal drainage. Not taking drainage rx . Is still taking flonase daily.  Denies any f/c/s, hemoptysis, nausea, vomiting. No recent travel or abx use.  Took albuterol neb tx this morning without much help.  No new meds . Has been doing well until this week with springtime blooms.      Past Medical History  Diagnosis Date  . Atrial fibrillation     ablation x 2 [facility], 01/2006, 2011.  on warfarin  . Hyperlipidemia   . Depression   . Anxiety   . Diabetes mellitus   . Anemia   . Arthritis   . GERD (gastroesophageal reflux disease)   . Sleep apnea   . Vocal cord disease   . CHF (congestive heart failure)     Echo 08/08/10 by [facility]. EF 35-45%. LV systolic function moderately reduced. Moderate global hypokinesis of LV.  RV systolic function moderately reduced. Mild MR. Trace AR.  Marland Kitchen. Cardiac arrest jan 2012    in hospital for pneumonia when this occured- occured at the hospital  . GASTROESOPHAGEAL REFLUX, NO ESOPHAGITIS 06/10/2006    Qualifier: Diagnosis of  By: Abundio MiuMcGregor, Barbara    . Asthma     has had multiple hospitalizations for this  . Non-ischemic cardiomyopathy     echo 08/08/10 - EF 35-45% LV and RV systolic function mod reduced  . Limb pain 06/04/2008    LLE, Baker's cyst in popliteal fossa, no DVT     Family History  Problem Relation Age of Onset  . Diabetes Father   .  Hypertension Father      History   Social History  . Marital Status: Widowed    Spouse Name: N/A    Number of Children: N/A  . Years of Education: N/A   Occupational History  . Not on file.   Social History Main Topics  . Smoking status: Never Smoker   . Smokeless tobacco: Never Used  . Alcohol Use: No  . Drug Use: No  . Sexual Activity: No   Other Topics Concern  . Not on file   Social History Narrative   Not employed   Exercise- walking 1 hour daily.    Diet- eating healthy diet.      Allergies  Allergen Reactions  . Avelox [Moxifloxacin Hcl In Nacl]     Cardiac arrest per pt  . Simvastatin Other (See Comments)    myalgias  . Ace Inhibitors Cough    Dry cough      Outpatient Prescriptions Prior to Visit  Medication Sig Dispense Refill  . B-D ULTRAFINE III SHORT PEN 31G X 8 MM MISC use as directed  100 each  9  . beclomethasone (QVAR) 40 MCG/ACT inhaler Inhale 2 puffs into the lungs 2 (two) times daily.  1 Inhaler  12  . buPROPion (WELLBUTRIN XL) 150 MG 24 hr tablet take 1 tablet by mouth once daily  30 tablet  8  . chlorpheniramine (CHLOR-TRIMETON) 4 MG tablet 8mg  at night  14 tablet  0  . digoxin (LANOXIN) 0.125 MG tablet Take 1/2 tablet by mouth daily  15 tablet  9  . dofetilide (TIKOSYN) 250 MCG capsule Take 250 mcg by mouth 2 (two) times daily.      . fluticasone (FLONASE) 50 MCG/ACT nasal spray instill 1 spray into each nostril twice a day  16 g  5  . glipiZIDE (GLUCOTROL) 5 MG tablet take 1 tablet by mouth twice a day before meals  120 tablet  3  . Insulin Glargine (LANTUS) 100 UNIT/ML Solostar Pen Inject 30 Units into the skin at bedtime.      . levalbuterol (XOPENEX HFA) 45 MCG/ACT inhaler Inhale 1-2 puffs into the lungs every 4 (four) hours as needed for wheezing.  1 Inhaler  0  . levalbuterol (XOPENEX) 0.63 MG/3ML nebulizer solution one in nebulizer four times daily as needed per Dr Delford Field  360 mL  4  . losartan (COZAAR) 50 MG tablet take 1 tablet by  mouth once daily  90 tablet  3  . metoprolol (LOPRESSOR) 50 MG tablet take 1 tablet by mouth twice a day  60 tablet  11  . NEXIUM 40 MG capsule take 1 capsule by mouth once daily  30 capsule  5  . polyethylene glycol powder (GLYCOLAX/MIRALAX) powder take 17GM (DISSOLVED IN WATER) by mouth once daily  255 g  5  . RA LORATADINE 10 MG tablet take 1 tablet by mouth once daily  30 tablet  3  . rosuvastatin (CRESTOR) 10 MG tablet Take 1 tablet (10 mg total) by mouth daily.  28 tablet  0  . spironolactone (ALDACTONE) 25 MG tablet take 1/2 tablet by mouth once daily  30 tablet  6  . torsemide (DEMADEX) 10 MG tablet take 2 tablets by mouth once daily  60 tablet  5  . warfarin (COUMADIN) 5 MG tablet Take 1 & 1/2 tablets by mouth daily or as directed  45 tablet  5  . esomeprazole (NEXIUM) 40 MG capsule Take 40 mg by mouth daily.      Marland Kitchen lidocaine (LIDODERM) 5 % apply 1 patch to the affected area daily as needed Leave on for 12 hours and then off for 12 hours.       No facility-administered medications prior to visit.      Review of Systems  Constitutional:   No  weight loss, night sweats,  Fevers, chills, fatigue, lassitude. HEENT:   No headaches,  Difficulty swallowing,  Tooth/dental problems,  Sore throat,                No sneezing, itching, ear ache, nasal congestion, + post nasal drip,   CV:  No chest pain,  Orthopnea, PND, swelling in lower extremities, anasarca, dizziness, palpitations  GI  No heartburn, indigestion, abdominal pain, nausea, vomiting, diarrhea, change in bowel habits, loss of appetite  Resp:   No coughing up of blood. Marland Kitchen  No chest wall deformity  Skin: no rash or lesions.  GU: no dysuria, change in color of urine, no urgency or frequency.  No flank pain.  MS:  No joint pain or swelling.  No decreased range of motion.  No back pain.  Psych:  No change in mood or affect. No depression or anxiety.  No  memory loss.     Objective:   Physical Exam  Filed Vitals:    07/07/13 1011  BP: 108/74  Pulse: 97  SpO2: 98%    Gen: Pleasant, well-nourished, in no distress,  normal affect  ENT: No lesions,  mouth clear,  oropharynx clear, no postnasal drip  Neck: No JVD, no TMG, no carotid bruits  Lungs: good airflow, speaking in full sentences, loud upper airway pseudowheeze   Cardiovascular: RRR, heart sounds normal, no murmur or gallops, no peripheral edema  Abdomen: soft and NT, no HSM,  BS normal  Musculoskeletal: No deformities, no cyanosis or clubbing  Neuro: alert, non focal  Skin: Warm, no lesions or rashes  No results found.      Assessment & Plan:   No problem-specific assessment & plan notes found for this encounter.   Updated Medication List Outpatient Encounter Prescriptions as of 07/07/2013  Medication Sig  . B-D ULTRAFINE III SHORT PEN 31G X 8 MM MISC use as directed  . beclomethasone (QVAR) 40 MCG/ACT inhaler Inhale 2 puffs into the lungs 2 (two) times daily.  Marland Kitchen buPROPion (WELLBUTRIN XL) 150 MG 24 hr tablet take 1 tablet by mouth once daily  . chlorpheniramine (CHLOR-TRIMETON) 4 MG tablet 8mg  at night  . digoxin (LANOXIN) 0.125 MG tablet Take 1/2 tablet by mouth daily  . dofetilide (TIKOSYN) 250 MCG capsule Take 250 mcg by mouth 2 (two) times daily.  . fluticasone (FLONASE) 50 MCG/ACT nasal spray instill 1 spray into each nostril twice a day  . glipiZIDE (GLUCOTROL) 5 MG tablet take 1 tablet by mouth twice a day before meals  . Insulin Glargine (LANTUS) 100 UNIT/ML Solostar Pen Inject 30 Units into the skin at bedtime.  . levalbuterol (XOPENEX HFA) 45 MCG/ACT inhaler Inhale 1-2 puffs into the lungs every 4 (four) hours as needed for wheezing.  . levalbuterol (XOPENEX) 0.63 MG/3ML nebulizer solution one in nebulizer four times daily as needed per Dr Delford Field  . losartan (COZAAR) 50 MG tablet take 1 tablet by mouth once daily  . metoprolol (LOPRESSOR) 50 MG tablet take 1 tablet by mouth twice a day  . NEXIUM 40 MG capsule take 1  capsule by mouth once daily  . polyethylene glycol powder (GLYCOLAX/MIRALAX) powder take 17GM (DISSOLVED IN WATER) by mouth once daily  . RA LORATADINE 10 MG tablet take 1 tablet by mouth once daily  . rosuvastatin (CRESTOR) 10 MG tablet Take 1 tablet (10 mg total) by mouth daily.  Marland Kitchen spironolactone (ALDACTONE) 25 MG tablet take 1/2 tablet by mouth once daily  . torsemide (DEMADEX) 10 MG tablet take 2 tablets by mouth once daily  . warfarin (COUMADIN) 5 MG tablet Take 1 & 1/2 tablets by mouth daily or as directed  . [DISCONTINUED] esomeprazole (NEXIUM) 40 MG capsule Take 40 mg by mouth daily.  . [DISCONTINUED] lidocaine (LIDODERM) 5 % apply 1 patch to the affected area daily as needed Leave on for 12 hours and then off for 12 hours.

## 2013-07-07 NOTE — Patient Instructions (Signed)
Delsym 2 tsp Twice daily  As needed  Cough  Chlortrimeton 4mg  1 in am and 2 At bedtime  As needed  Throat clearing /drainage  Hydromet 1-2 tsp every 4-6 hr As needed  Cough , may make you sleepy.  Chest xray today  Please contact office for sooner follow up if symptoms do not improve or worsen or seek emergency care  Follow up Dr. Delford Field  In 4-6 weeks and As needed

## 2013-07-07 NOTE — Addendum Note (Signed)
Addended by: Boone Master E on: 07/07/2013 10:38 AM   Modules accepted: Orders

## 2013-07-08 ENCOUNTER — Other Ambulatory Visit: Payer: Self-pay | Admitting: Critical Care Medicine

## 2013-07-12 ENCOUNTER — Other Ambulatory Visit: Payer: Self-pay | Admitting: Critical Care Medicine

## 2013-07-13 ENCOUNTER — Telehealth: Payer: Self-pay | Admitting: Adult Health

## 2013-07-13 NOTE — Telephone Encounter (Signed)
Result Notes    Notes Recorded by Tommie Sams, CMA on 07/13/2013 at 9:58 AM [email or web address] x2 ------  Notes Recorded by Julio Sicks, NP on 07/07/2013 at 11:36 AM No acute process noted  Clear  Cont w/ ov recs  Please contact office for sooner follow up if symptoms do not improve or worsen or seek emergency care      Spoke with daughter-aware of results and no questions or concerns at this time.

## 2013-07-17 ENCOUNTER — Telehealth: Payer: Self-pay | Admitting: Family Medicine

## 2013-07-17 NOTE — Telephone Encounter (Signed)
Mrs. Vannote' pharmacy sent request for her dm supplies over a week ago and have not heard anything yet.  Please send rx to [facility] at [phone].    Can also send rx as a lifetime rx.

## 2013-07-18 NOTE — Telephone Encounter (Signed)
I am covering Dr. Willaim Rayas box while she is away on vacation. I do not see any request for DM supplies in either her box or mine. Can we contact patient and have them re-submit the request? I will be happy to send in the prescription if the request is sent again.  Latrelle Dodrill, MD

## 2013-07-19 ENCOUNTER — Ambulatory Visit (INDEPENDENT_AMBULATORY_CARE_PROVIDER_SITE_OTHER): Payer: Medicare Other | Admitting: Pharmacist Clinician (PhC)/ Clinical Pharmacy Specialist

## 2013-07-19 VITALS — Wt 196.0 lb

## 2013-07-19 DIAGNOSIS — Z7901 Long term (current) use of anticoagulants: Secondary | ICD-10-CM

## 2013-07-19 DIAGNOSIS — I4891 Unspecified atrial fibrillation: Secondary | ICD-10-CM

## 2013-07-19 LAB — POCT INR: INR: 3

## 2013-07-24 DIAGNOSIS — I4901 Ventricular fibrillation: Secondary | ICD-10-CM | POA: Diagnosis not present

## 2013-07-24 DIAGNOSIS — I4891 Unspecified atrial fibrillation: Secondary | ICD-10-CM | POA: Diagnosis not present

## 2013-07-24 DIAGNOSIS — I509 Heart failure, unspecified: Secondary | ICD-10-CM | POA: Diagnosis not present

## 2013-07-28 ENCOUNTER — Other Ambulatory Visit: Payer: Self-pay | Admitting: *Deleted

## 2013-07-29 MED ORDER — POLYETHYLENE GLYCOL 3350 17 GM/SCOOP PO POWD: ORAL | Status: DC

## 2013-07-31 ENCOUNTER — Telehealth: Payer: Self-pay | Admitting: Cardiovascular Disease

## 2013-07-31 MED ORDER — ROSUVASTATIN CALCIUM 10 MG PO TABS: 10.0000 mg | ORAL_TABLET | Freq: Every day | ORAL | Status: DC

## 2013-07-31 NOTE — Telephone Encounter (Signed)
No Crestor samples available.  Rx sent to pharmacy.  #90 at patient's request.

## 2013-07-31 NOTE — Telephone Encounter (Signed)
No Crestor samples available.  Rx sent to pharmacy.  #90 at patient's request. 

## 2013-07-31 NOTE — Telephone Encounter (Signed)
She was given some samples of Crestor.Does he want her to continue taking it? If so she will need some more samples or a prescription please.

## 2013-08-04 ENCOUNTER — Ambulatory Visit: Payer: Medicare Other | Admitting: Critical Care Medicine

## 2013-08-10 ENCOUNTER — Other Ambulatory Visit: Payer: Self-pay | Admitting: Critical Care Medicine

## 2013-08-10 ENCOUNTER — Other Ambulatory Visit: Payer: Self-pay | Admitting: Family Medicine

## 2013-08-12 ENCOUNTER — Other Ambulatory Visit: Payer: Self-pay | Admitting: Pharmacist Clinician (PhC)/ Clinical Pharmacy Specialist

## 2013-08-14 NOTE — Telephone Encounter (Signed)
Warfarin refill ?

## 2013-08-15 NOTE — Telephone Encounter (Signed)
Pt called and the fax that we sent is not filled out correctly. We need to fill out the prescription completely and re-fax this so that she can get her prescriptions. This has been going on for over a month. Please fax to [phone]. jw

## 2013-08-16 ENCOUNTER — Ambulatory Visit (INDEPENDENT_AMBULATORY_CARE_PROVIDER_SITE_OTHER): Payer: Medicare Other | Admitting: Pharmacist Clinician (PhC)/ Clinical Pharmacy Specialist

## 2013-08-16 VITALS — Wt 200.1 lb

## 2013-08-16 DIAGNOSIS — I4891 Unspecified atrial fibrillation: Secondary | ICD-10-CM

## 2013-08-16 DIAGNOSIS — Z7901 Long term (current) use of anticoagulants: Secondary | ICD-10-CM | POA: Diagnosis not present

## 2013-08-16 LAB — POCT INR: INR: 2.9

## 2013-08-16 MED ORDER — TORSEMIDE 10 MG PO TABS: ORAL_TABLET | ORAL | Status: DC

## 2013-08-16 NOTE — Telephone Encounter (Signed)
Please sent fax with instructions about what is required to be filled. I am sorry that this is going on for so long. The request has to come with clear instructions and if not meet criteria needs to be sent back with clarifications. I have not received any of on my inbox. I will be glad to fill form with appropriate instructions. Thank you

## 2013-08-16 NOTE — Telephone Encounter (Signed)
Attending needing to sign Rx.await new request once received Sign by Dr Gwendolyn Grant will re-fax back.Patient understands why it  took so long for this process . I informed patient that [facility] hasn't been clear and that this was  not in  our doing.unfortunately [facility] hasn't been clear in what was expect from our office.I have explained to Dr Gwendolyn Grant.He agree to sign form . all faculty NPI numbers need to be  activated for them to sign as well.They only had Dr Mauricio Po and Dr Gwendolyn Grant NPI number.[name] [name]

## 2013-08-24 ENCOUNTER — Ambulatory Visit (INDEPENDENT_AMBULATORY_CARE_PROVIDER_SITE_OTHER): Payer: Medicare Other | Admitting: *Deleted

## 2013-08-24 DIAGNOSIS — I5022 Chronic systolic (congestive) heart failure: Secondary | ICD-10-CM | POA: Diagnosis not present

## 2013-08-24 DIAGNOSIS — I4891 Unspecified atrial fibrillation: Secondary | ICD-10-CM | POA: Diagnosis not present

## 2013-08-24 LAB — MDC_IDC_ENUM_SESS_TYPE_REMOTE
Battery Voltage: 3.09 V
Brady Statistic AP VP Percent: 0.42 %
Brady Statistic AS VS Percent: 3.94 %
Date Time Interrogation Session: 20150514111731
HighPow Impedance: 171 Ohm
HighPow Impedance: 342 Ohm
HighPow Impedance: 66 Ohm
Lead Channel Impedance Value: 418 Ohm
Lead Channel Pacing Threshold Amplitude: 0.375 V
Lead Channel Pacing Threshold Pulse Width: 0.4 ms
Lead Channel Pacing Threshold Pulse Width: 0.4 ms
Lead Channel Sensing Intrinsic Amplitude: 1.375 mV
Lead Channel Sensing Intrinsic Amplitude: 1.375 mV
Lead Channel Setting Pacing Amplitude: 2.5 V
Lead Channel Setting Pacing Pulse Width: 0.4 ms
MDC IDC MSMT LEADCHNL RV IMPEDANCE VALUE: 418 Ohm
MDC IDC MSMT LEADCHNL RV PACING THRESHOLD AMPLITUDE: 0.625 V
MDC IDC MSMT LEADCHNL RV SENSING INTR AMPL: 10.5 mV
MDC IDC MSMT LEADCHNL RV SENSING INTR AMPL: 10.5 mV
MDC IDC SET LEADCHNL RA PACING AMPLITUDE: 2 V
MDC IDC SET LEADCHNL RV SENSING SENSITIVITY: 0.3 mV
MDC IDC SET ZONE DETECTION INTERVAL: 330 ms
MDC IDC SET ZONE DETECTION INTERVAL: 360 ms
MDC IDC STAT BRADY AP VS PERCENT: 95.6 %
MDC IDC STAT BRADY AS VP PERCENT: 0.04 %
MDC IDC STAT BRADY RA PERCENT PACED: 96.02 %
MDC IDC STAT BRADY RV PERCENT PACED: 0.46 %
Zone Setting Detection Interval: 270 ms
Zone Setting Detection Interval: 400 ms

## 2013-08-24 NOTE — Progress Notes (Signed)
Remote ICD transmission.   

## 2013-08-30 ENCOUNTER — Other Ambulatory Visit: Payer: Self-pay | Admitting: *Deleted

## 2013-08-30 DIAGNOSIS — E782 Mixed hyperlipidemia: Secondary | ICD-10-CM | POA: Diagnosis not present

## 2013-08-30 DIAGNOSIS — Z79899 Other long term (current) drug therapy: Secondary | ICD-10-CM | POA: Diagnosis not present

## 2013-08-30 LAB — COMPREHENSIVE METABOLIC PANEL
ALBUMIN: 3.9 g/dL (ref 3.5–5.2)
ALT: 29 U/L (ref 0–35)
AST: 22 U/L (ref 0–37)
Alkaline Phosphatase: 94 U/L (ref 39–117)
BUN: 12 mg/dL (ref 6–23)
CHLORIDE: 100 meq/L (ref 96–112)
CO2: 31 mEq/L (ref 19–32)
Calcium: 9.1 mg/dL (ref 8.4–10.5)
Creat: 1.08 mg/dL (ref 0.50–1.10)
GLUCOSE: 126 mg/dL — AB (ref 70–99)
POTASSIUM: 4.1 meq/L (ref 3.5–5.3)
Sodium: 139 mEq/L (ref 135–145)
TOTAL PROTEIN: 7 g/dL (ref 6.0–8.3)
Total Bilirubin: 0.5 mg/dL (ref 0.2–1.2)

## 2013-08-30 LAB — LIPID PANEL
Cholesterol: 130 mg/dL (ref 0–200)
HDL: 43 mg/dL (ref >39)
LDL Cholesterol: 68 mg/dL (ref 0–99)
Total CHOL/HDL Ratio: 3 Ratio
Triglycerides: 96 mg/dL (ref <150)
VLDL: 19 mg/dL (ref 0–40)

## 2013-08-30 MED ORDER — INSULIN GLARGINE 100 UNIT/ML SOLOSTAR PEN: 30.0000 [IU] | PEN_INJECTOR | Freq: Every day | SUBCUTANEOUS | Status: DC

## 2013-08-31 ENCOUNTER — Ambulatory Visit (INDEPENDENT_AMBULATORY_CARE_PROVIDER_SITE_OTHER): Payer: Medicare Other | Admitting: Critical Care Medicine

## 2013-08-31 ENCOUNTER — Encounter: Payer: Self-pay | Admitting: Critical Care Medicine

## 2013-08-31 VITALS — BP 126/86 | HR 71 | Temp 98.1°F | Ht 67.0 in | Wt 201.0 lb

## 2013-08-31 DIAGNOSIS — J45909 Unspecified asthma, uncomplicated: Secondary | ICD-10-CM

## 2013-08-31 MED ORDER — FLUTICASONE PROPIONATE 50 MCG/ACT NA SUSP: NASAL | Status: DC

## 2013-08-31 MED ORDER — LEVALBUTEROL HCL 0.63 MG/3ML IN NEBU: INHALATION_SOLUTION | RESPIRATORY_TRACT | Status: DC

## 2013-08-31 MED ORDER — BECLOMETHASONE DIPROPIONATE 40 MCG/ACT IN AERS: 2.0000 | INHALATION_SPRAY | Freq: Two times a day (BID) | RESPIRATORY_TRACT | Status: DC

## 2013-08-31 NOTE — Patient Instructions (Signed)
No change in medications. Return in         4 months [place]

## 2013-08-31 NOTE — Progress Notes (Signed)
Subjective:    Patient ID: [name], female    DOB: 01/04/1951, 63 y.o.   MRN: [id number]  HPI 63 y.o.  AAF with known hx of Asthma , VCD, CHF and Atrial fib on chronic coumadin    08/31/2013 Chief Complaint  Patient presents with  . 2 month follow up    Breathing doing well overall.  No SOB, wheezing, chest tightness/pain, or cough at this time.  Doing well now.  No real cough. No wheezing .  No chest pain. No GERD symptoms. No new heart issues. Pt has Chronic atrial fib.  Pt denies any significant sore throat, nasal congestion or excess secretions, fever, chills, sweats, unintended weight loss, pleurtic or exertional chest pain, orthopnea PND, or leg swelling Pt denies any increase in rescue therapy over baseline, denies waking up needing it or having any early am or nocturnal exacerbations of coughing/wheezing/or dyspnea. Pt also denies any obvious fluctuation in symptoms with  weather or environmental change or other alleviating or aggravating factors    PUL ASTHMA HISTORY 08/31/2013 06/26/2011  Symptoms 0-2 days/week Daily  Nighttime awakenings 0-2/month 0-2/month  Interference with activity Minor limitations Some limitations  SABA use 0-2 days/wk Several times/day  Exacerbations requiring oral steroids 0-1 / year 0-1 / year      Review of Systems  Constitutional:   No  weight loss, night sweats,  Fevers, chills, fatigue, lassitude. HEENT:   No headaches,  Difficulty swallowing,  Tooth/dental problems,  Sore throat,                No sneezing, itching, ear ache, nasal congestion, No post nasal drip,   CV:  No chest pain,  Orthopnea, PND, swelling in lower extremities, anasarca, dizziness, palpitations  GI  No heartburn, indigestion, abdominal pain, nausea, vomiting, diarrhea, change in bowel habits, loss of appetite  Resp:   No coughing up of blood. Marland Kitchen.  No chest wall deformity  Skin: no rash or lesions.  GU: no dysuria, change in color of urine, no urgency or  frequency.  No flank pain.  MS:  No joint pain or swelling.  No decreased range of motion.  No back pain.  Psych:  No change in mood or affect. No depression or anxiety.  No memory loss.     Objective:   Physical Exam  Filed Vitals:   08/31/13 0918  BP: 126/86  Pulse: 71  Temp: 98.1 F (36.7 C)  TempSrc: Oral  Height: 5\' 7"  (1.702 m)  Weight: 201 lb (91.173 kg)  SpO2: 98%    Gen: Pleasant, well-nourished, in no distress,  normal affect  ENT: No lesions,  mouth clear,  oropharynx clear, no postnasal drip  Neck: No JVD, no TMG, no carotid bruits  Lungs: good airflow, speaking in full sentences, no wheeze  Cardiovascular: RRR, heart sounds normal, no murmur or gallops, no peripheral edema  Abdomen: soft and NT, no HSM,  BS normal  Musculoskeletal: No deformities, no cyanosis or clubbing  Neuro: alert, non focal  Skin: Warm, no lesions or rashes  No results found.      Assessment & Plan:   Moderate persistent asthma with significant atopic features Moderate persistent asthma stable at present Assoc VCD. Atopic and GERD ppt factors Plan Stay on Qvar daily flonase daily Prn xopenex rov 4 months    Updated Medication List Outpatient Encounter Prescriptions as of 08/31/2013  Medication Sig  . B-D ULTRAFINE III SHORT PEN 31G X 8 MM MISC use as directed  .  beclomethasone (QVAR) 40 MCG/ACT inhaler Inhale 2 puffs into the lungs 2 (two) times daily.  Marland Kitchen buPROPion (WELLBUTRIN XL) 150 MG 24 hr tablet take 1 tablet by mouth once daily  . chlorpheniramine (CHLOR-TRIMETON) 4 MG tablet 8mg  at night  . digoxin (LANOXIN) 0.125 MG tablet Take 1/2 tablet by mouth daily  . dofetilide (TIKOSYN) 250 MCG capsule Take 250 mcg by mouth 2 (two) times daily.  Marland Kitchen esomeprazole (NEXIUM) 40 MG capsule take 1 capsule by mouth once daily  . fluticasone (FLONASE) 50 MCG/ACT nasal spray instill 1 spray into each nostril twice a day  . glipiZIDE (GLUCOTROL) 5 MG tablet take 1 tablet by mouth  twice a day before meals  . HYDROcodone-homatropine (HYDROMET) 5-1.5 MG/5ML syrup Take 5 mLs by mouth every 6 (six) hours as needed for cough.  . Insulin Glargine (LANTUS) 100 UNIT/ML Solostar Pen Inject 30 Units into the skin at bedtime.  . levalbuterol (XOPENEX) 0.63 MG/3ML nebulizer solution one in nebulizer four times daily as needed per Dr Delford Field  . losartan (COZAAR) 50 MG tablet take 1 tablet by mouth once daily  . metoprolol (LOPRESSOR) 50 MG tablet take 1 tablet by mouth twice a day  . polyethylene glycol powder (GLYCOLAX/MIRALAX) powder take 17GM (DISSOLVED IN WATER) by mouth once daily  . RA LORATADINE 10 MG tablet take 1 tablet by mouth once daily  . rosuvastatin (CRESTOR) 10 MG tablet Take 1 tablet (10 mg total) by mouth daily.  Marland Kitchen spironolactone (ALDACTONE) 25 MG tablet take 1/2 tablet by mouth once daily  . torsemide (DEMADEX) 10 MG tablet take 2 tablets by mouth once daily or as directed  . warfarin (COUMADIN) 5 MG tablet take 1 and 1/2 tablets by mouth once daily OR as directed  . XOPENEX HFA 45 MCG/ACT inhaler inhale 1 to 2 puffs by mouth INTO THE LUNGS every 4 hours if needed for wheezing  . [DISCONTINUED] beclomethasone (QVAR) 40 MCG/ACT inhaler Inhale 2 puffs into the lungs 2 (two) times daily.  . [DISCONTINUED] fluticasone (FLONASE) 50 MCG/ACT nasal spray instill 1 spray into each nostril twice a day  . [DISCONTINUED] levalbuterol (XOPENEX) 0.63 MG/3ML nebulizer solution one in nebulizer four times daily as needed per Dr Delford Field

## 2013-08-31 NOTE — Assessment & Plan Note (Signed)
Moderate persistent asthma stable at present Assoc VCD. Atopic and GERD ppt factors Plan Stay on Qvar daily flonase daily Prn xopenex rov 4 months

## 2013-09-01 ENCOUNTER — Other Ambulatory Visit: Payer: Self-pay | Admitting: *Deleted

## 2013-09-01 MED ORDER — LEVALBUTEROL HCL 0.63 MG/3ML IN NEBU: INHALATION_SOLUTION | RESPIRATORY_TRACT | Status: DC

## 2013-09-13 ENCOUNTER — Ambulatory Visit (INDEPENDENT_AMBULATORY_CARE_PROVIDER_SITE_OTHER): Payer: Medicare Other | Admitting: Pharmacist Clinician (PhC)/ Clinical Pharmacy Specialist

## 2013-09-13 VITALS — Wt 199.8 lb

## 2013-09-13 DIAGNOSIS — I4891 Unspecified atrial fibrillation: Secondary | ICD-10-CM | POA: Diagnosis not present

## 2013-09-13 DIAGNOSIS — Z7901 Long term (current) use of anticoagulants: Secondary | ICD-10-CM

## 2013-09-13 LAB — POCT INR: INR: 3.2

## 2013-09-25 ENCOUNTER — Ambulatory Visit (INDEPENDENT_AMBULATORY_CARE_PROVIDER_SITE_OTHER): Payer: Medicare Other | Admitting: Family Medicine

## 2013-09-25 ENCOUNTER — Encounter: Payer: Self-pay | Admitting: Family Medicine

## 2013-09-25 VITALS — BP 123/84 | HR 90 | Temp 98.2°F | Ht 67.0 in | Wt 197.3 lb

## 2013-09-25 DIAGNOSIS — I4891 Unspecified atrial fibrillation: Secondary | ICD-10-CM | POA: Diagnosis not present

## 2013-09-25 DIAGNOSIS — IMO0002 Reserved for concepts with insufficient information to code with codable children: Secondary | ICD-10-CM | POA: Diagnosis not present

## 2013-09-25 DIAGNOSIS — M1710 Unilateral primary osteoarthritis, unspecified knee: Secondary | ICD-10-CM

## 2013-09-25 DIAGNOSIS — E119 Type 2 diabetes mellitus without complications: Secondary | ICD-10-CM

## 2013-09-25 DIAGNOSIS — Z23 Encounter for immunization: Secondary | ICD-10-CM | POA: Diagnosis not present

## 2013-09-25 LAB — POCT GLYCOSYLATED HEMOGLOBIN (HGB A1C): Hemoglobin A1C: 9.2

## 2013-09-25 NOTE — Progress Notes (Signed)
Family Medicine Office Visit Note   Subjective:   Patient ID: [name], female  DOB: [birth year], 63 y.o.. MRN: [id number]   Pt that comes today for f/u her DM. She reports doing well and sugars at home 100-150 range. She is on Lantus 30 units and reports compliance. Denies hypoglycemic events or hyperglycemic symptoms. Eye exam due this September.  Her other issue is her L Knee: she has hx of DJD and has had steroids and gel injections. Last injection was last year. Today she reports pain and would like to consider steroid injection for symptoms relieve b/c she is going out of town at the end of this week. Denies redness or increase in her baseline knee swelling.  Last INR 3.2   Review of Systems:  Pt denies SOB, chest pain, palpitations, headaches, dizziness, numbness or weakness. No changes on urinary or BM habits. No unintentional weigh loss/gain.  Objective:   Physical Exam: Gen:  NAD HEENT: Moist mucous membranes  CV: regular rythm normal rate, no murmurs rubs or gallops PULM: Clear to auscultation bilaterally. No wheezes/rales/rhonchi ABD: Soft, non tender, non distended, normal bowel sounds EXT: no erythema and mild effusion on L knee with normal ROM. Neurovascular intact. Diabetic foot exam with intact sensation and no lesions.  Neuro: Alert and oriented x3. No focalization  Assessment & Plan:

## 2013-09-25 NOTE — Patient Instructions (Addendum)
Your A1C is not at goal. You can increase Lantus 2 units every 2 days and continue to check sugars with target 100-150. No more than 36 units total. Please work in [employer] as well.  Follow up in 2 weeks  Knee Injection: AFTER THE PROCEDURE   You can go home after the procedure.  You may need to put ice on the joint 15-20 minutes every 3 or 4 hours until the pain goes away.  You may need to put an elastic bandage on the joint. HOME CARE INSTRUCTIONS   Only take over-the-counter or prescription medicines for pain, discomfort, or fever as directed by your caregiver.  You should avoid stressing the joint. Unless advised otherwise, avoid activities that put a lot of pressure on a knee joint, such as:  Jogging.  Bicycling.  Recreational climbing.  Hiking.  Laying down and elevating the leg/knee above the level of your heart can help to minimize swelling. SEEK MEDICAL CARE IF:   You have repeated or worsening swelling.  There is drainage from the puncture area.  You develop red streaking that extends above or below the site where the needle was inserted. SEEK IMMEDIATE MEDICAL CARE IF:   You develop a fever.  You have pain that gets worse even though you are taking pain medicine.  The area is red and warm, and you have trouble moving the joint. MAKE SURE YOU:   Understand these instructions.  Will watch your condition.  Will get help right away if you are not doing well or get worse. Document Released: 06/21/2006 Document Revised: 06/22/2011 Document Reviewed: 03/18/2007 [facility] Patient Information 2015 [place], [place]. This information is not intended to replace advice given to you by your health care provider. Make sure you discuss any questions you have with your health care provider.

## 2013-09-27 NOTE — Assessment & Plan Note (Addendum)
Uncontroled DM. A1C increased from prior.  P: lantus increase 2 units every 2 days to keep CBG's in the 100-150 range max 200. (no more than 36 units daily for now) F/u in 2 weeks.  Continue glipizide.

## 2013-09-27 NOTE — Assessment & Plan Note (Signed)
L Knee steroid injection performed today. Knee Injection Procedure Note  Pre-operative Diagnosis: left   Post-operative Diagnosis: same  Indications: Symptom relief from osteoarthritis  Procedure Details   Written consent was obtained for the procedure. The joint was prepped with Betadine and a small wheel of anesthetic was injected into the subcutaneous tissue. A 22 gauge needle was inserted into the superior aspect of the joint from a lateral approach. The needle was removed and the area cleansed and dressed.  Complications:  None; patient tolerated the procedure well.

## 2013-10-04 ENCOUNTER — Ambulatory Visit (INDEPENDENT_AMBULATORY_CARE_PROVIDER_SITE_OTHER): Payer: Medicare Other | Admitting: Pharmacist Clinician (PhC)/ Clinical Pharmacy Specialist

## 2013-10-04 VITALS — Wt 198.2 lb

## 2013-10-04 DIAGNOSIS — I4891 Unspecified atrial fibrillation: Secondary | ICD-10-CM | POA: Diagnosis not present

## 2013-10-04 DIAGNOSIS — Z7901 Long term (current) use of anticoagulants: Secondary | ICD-10-CM

## 2013-10-04 LAB — POCT INR: INR: 3.1

## 2013-10-06 DIAGNOSIS — M1710 Unilateral primary osteoarthritis, unspecified knee: Secondary | ICD-10-CM | POA: Diagnosis not present

## 2013-10-09 ENCOUNTER — Other Ambulatory Visit: Payer: Self-pay | Admitting: *Deleted

## 2013-10-09 MED ORDER — BUPROPION HCL ER (XL) 150 MG PO TB24: ORAL_TABLET | ORAL | Status: DC

## 2013-10-10 ENCOUNTER — Encounter: Payer: Self-pay | Admitting: Cardiology

## 2013-10-12 DIAGNOSIS — M1710 Unilateral primary osteoarthritis, unspecified knee: Secondary | ICD-10-CM | POA: Diagnosis not present

## 2013-10-18 ENCOUNTER — Encounter: Payer: Self-pay | Admitting: Cardiovascular Disease

## 2013-10-18 DIAGNOSIS — M1710 Unilateral primary osteoarthritis, unspecified knee: Secondary | ICD-10-CM | POA: Diagnosis not present

## 2013-10-23 DIAGNOSIS — IMO0001 Reserved for inherently not codable concepts without codable children: Secondary | ICD-10-CM | POA: Diagnosis not present

## 2013-10-23 DIAGNOSIS — I10 Essential (primary) hypertension: Secondary | ICD-10-CM | POA: Diagnosis not present

## 2013-10-23 DIAGNOSIS — E119 Type 2 diabetes mellitus without complications: Secondary | ICD-10-CM | POA: Diagnosis not present

## 2013-10-23 DIAGNOSIS — H35039 Hypertensive retinopathy, unspecified eye: Secondary | ICD-10-CM | POA: Diagnosis not present

## 2013-10-30 ENCOUNTER — Other Ambulatory Visit: Payer: Self-pay | Admitting: Cardiovascular Disease

## 2013-11-01 ENCOUNTER — Ambulatory Visit (INDEPENDENT_AMBULATORY_CARE_PROVIDER_SITE_OTHER): Payer: Medicare Other | Admitting: Pharmacist Clinician (PhC)/ Clinical Pharmacy Specialist

## 2013-11-01 VITALS — Wt 197.1 lb

## 2013-11-01 DIAGNOSIS — I4891 Unspecified atrial fibrillation: Secondary | ICD-10-CM

## 2013-11-01 DIAGNOSIS — Z7901 Long term (current) use of anticoagulants: Secondary | ICD-10-CM

## 2013-11-01 LAB — POCT INR: INR: 1.5

## 2013-11-06 ENCOUNTER — Telehealth: Payer: Self-pay | Admitting: Pharmacist Clinician (PhC)/ Clinical Pharmacy Specialist

## 2013-11-06 NOTE — Telephone Encounter (Signed)
Pt going to new dentist, possibly needs filling or tooth pulled.  States dental clinic will not see her until they have word from [place] that she may have basic dental procedures done while on warfarin.  Will call office in AM to determine what they need.

## 2013-11-06 NOTE — Telephone Encounter (Signed)
Patient needs to have a dental procedure and wants to know how long should she be off her Coumadin?

## 2013-11-07 NOTE — Telephone Encounter (Signed)
Spoke with dental office, they asked for a medical release form to be filled out, it was faxed to [place], filled out and faxed back this am.

## 2013-11-15 ENCOUNTER — Ambulatory Visit (INDEPENDENT_AMBULATORY_CARE_PROVIDER_SITE_OTHER): Payer: Medicare Other | Admitting: Pharmacist Clinician (PhC)/ Clinical Pharmacy Specialist

## 2013-11-15 VITALS — Wt 196.7 lb

## 2013-11-15 DIAGNOSIS — Z7901 Long term (current) use of anticoagulants: Secondary | ICD-10-CM

## 2013-11-15 DIAGNOSIS — I4891 Unspecified atrial fibrillation: Secondary | ICD-10-CM | POA: Diagnosis not present

## 2013-11-15 LAB — POCT INR: INR: 2.8

## 2013-12-12 ENCOUNTER — Other Ambulatory Visit: Payer: Self-pay | Admitting: *Deleted

## 2013-12-12 MED ORDER — POLYETHYLENE GLYCOL 3350 17 GM/SCOOP PO POWD: ORAL | Status: DC

## 2013-12-13 ENCOUNTER — Ambulatory Visit: Payer: Medicare Other | Admitting: Pharmacist Clinician (PhC)/ Clinical Pharmacy Specialist

## 2013-12-15 ENCOUNTER — Ambulatory Visit (INDEPENDENT_AMBULATORY_CARE_PROVIDER_SITE_OTHER): Payer: Medicare Other | Admitting: Pharmacist Clinician (PhC)/ Clinical Pharmacy Specialist

## 2013-12-15 VITALS — Wt 194.5 lb

## 2013-12-15 DIAGNOSIS — Z7901 Long term (current) use of anticoagulants: Secondary | ICD-10-CM

## 2013-12-15 DIAGNOSIS — I4891 Unspecified atrial fibrillation: Secondary | ICD-10-CM | POA: Diagnosis not present

## 2013-12-15 LAB — POCT INR: INR: 3.7

## 2013-12-21 ENCOUNTER — Encounter: Payer: Medicare Other | Admitting: Cardiovascular Disease

## 2013-12-25 ENCOUNTER — Other Ambulatory Visit: Payer: Self-pay | Admitting: Critical Care Medicine

## 2013-12-25 ENCOUNTER — Other Ambulatory Visit: Payer: Self-pay | Admitting: *Deleted

## 2013-12-25 MED ORDER — GLIPIZIDE 5 MG PO TABS: ORAL_TABLET | ORAL | Status: DC

## 2013-12-25 NOTE — Telephone Encounter (Signed)
I've sent a refill of glipizide to the pharmacy.  Please have her schedule an appointment soon to follow up her diabetes management and labs!

## 2013-12-25 NOTE — Telephone Encounter (Signed)
Spoke with patient and appointment set up for 10/2 with Dr. Caroleen Hamman

## 2014-01-04 ENCOUNTER — Ambulatory Visit (INDEPENDENT_AMBULATORY_CARE_PROVIDER_SITE_OTHER): Payer: Medicare Other | Admitting: Critical Care Medicine

## 2014-01-04 ENCOUNTER — Encounter: Payer: Self-pay | Admitting: Critical Care Medicine

## 2014-01-04 VITALS — BP 128/64 | HR 78 | Ht 67.0 in | Wt 194.0 lb

## 2014-01-04 DIAGNOSIS — G4730 Sleep apnea, unspecified: Secondary | ICD-10-CM

## 2014-01-04 DIAGNOSIS — E669 Obesity, unspecified: Secondary | ICD-10-CM

## 2014-01-04 DIAGNOSIS — J45909 Unspecified asthma, uncomplicated: Secondary | ICD-10-CM

## 2014-01-04 DIAGNOSIS — Z23 Encounter for immunization: Secondary | ICD-10-CM

## 2014-01-04 HISTORY — DX: Obesity, unspecified: E66.9

## 2014-01-04 NOTE — Patient Instructions (Addendum)
Flu shot given A sleep study will be done No change in medications Return 6 months

## 2014-01-04 NOTE — Assessment & Plan Note (Signed)
Weight down 233>>198 .  Pt on diet and exercise plan and doing well with this .

## 2014-01-04 NOTE — Progress Notes (Signed)
Subjective:    Patient ID: [name], female    DOB: November 03, 1950, 63 y.o.   MRN: [id number]  HPI 63 y.o.  AAF with known hx of Asthma , VCD, CHF and Atrial fib on chronic coumadin   01/04/2014 Chief Complaint  Patient presents with  . Follow-up    Pt has no breathing complaints at this time.  has lost 7 lbs since last visit.   Pt has lost 7#.  No real cough. No chest pain.  No syncope No palpitations. No mucus Pt denies any significant sore throat, nasal congestion or excess secretions, fever, chills, sweats, unintended weight loss, pleurtic or exertional chest pain, orthopnea PND, or leg swelling Pt denies any increase in rescue therapy over baseline, denies waking up needing it or having any early am or nocturnal exacerbations of coughing/wheezing/or dyspnea. Pt also denies any obvious fluctuation in symptoms with  weather or environmental change or other alleviating or aggravating factors     PUL ASTHMA HISTORY 01/04/2014 08/31/2013 06/26/2011  Symptoms 0-2 days/week 0-2 days/week Daily  Nighttime awakenings 0-2/month 0-2/month 0-2/month  Interference with activity No limitations Minor limitations Some limitations  SABA use 0-2 days/wk 0-2 days/wk Several times/day  Exacerbations requiring oral steroids 0-1 / year 0-1 / year 0-1 / year      Review of Systems  Constitutional:   No  weight loss, night sweats,  Fevers, chills, fatigue, lassitude. HEENT:   No headaches,  Difficulty swallowing,  Tooth/dental problems,  Sore throat,                No sneezing, itching, ear ache, nasal congestion, No post nasal drip,   CV:  No chest pain,  Orthopnea, PND, swelling in lower extremities, anasarca, dizziness, palpitations  GI  No heartburn, indigestion, abdominal pain, nausea, vomiting, diarrhea, change in bowel habits, loss of appetite  Resp:   No coughing up of blood. Marland Kitchen  No chest wall deformity  Skin: no rash or lesions.  GU: no dysuria, change in color of urine, no urgency or  frequency.  No flank pain.  MS:  No joint pain or swelling.  No decreased range of motion.  No back pain.  Psych:  No change in mood or affect. No depression or anxiety.  No memory loss.     Objective:   Physical Exam  Filed Vitals:   01/04/14 0912  BP: 128/64  Pulse: 78  Height:  (1.702 m)  Weight: 194 lb (87.998 kg)  SpO2: 100%    Gen: Pleasant, well-nourished, in no distress,  normal affect  ENT: No lesions,  mouth clear,  oropharynx clear, no postnasal drip  Neck: No JVD, no TMG, no carotid bruits  Lungs: good airflow, speaking in full sentences, no wheeze  Cardiovascular: RRR, heart sounds normal, no murmur or gallops, no peripheral edema  Abdomen: soft and NT, no HSM,  BS normal  Musculoskeletal: No deformities, no cyanosis or clubbing  Neuro: alert, non focal  Skin: Warm, no lesions or rashes  No results found.      Assessment & Plan:   Obesity (BMI 30-39.9) Weight down 233>>198 .  Pt on diet and exercise plan and doing well with this .  Moderate persistent asthma with significant atopic features Mod persistent asthma stable Plan Flu shot given A sleep study will be done No change in medications Return 6 months    APNEA, SLEEP Not compliant with cpap ? Needs new device and adjusted pressures Plan Repeat sleep study, split  night     Updated Medication List Outpatient Encounter Prescriptions as of 01/04/2014  Medication Sig  . B-D ULTRAFINE III SHORT PEN 31G X 8 MM MISC use as directed  . beclomethasone (QVAR) 40 MCG/ACT inhaler Inhale 2 puffs into the lungs 2 (two) times daily.  Marland Kitchen buPROPion (WELLBUTRIN XL) 150 MG 24 hr tablet take 1 tablet by mouth once daily  . chlorpheniramine (CHLOR-TRIMETON) 4 MG tablet 8mg  at night  . digoxin (LANOXIN) 0.125 MG tablet take 1/2 tablet by mouth once daily  . dofetilide (TIKOSYN) 250 MCG capsule Take 250 mcg by mouth 2 (two) times daily.  . fluticasone (FLONASE) 50 MCG/ACT nasal spray instill 1 spray  into each nostril twice a day  . glipiZIDE (GLUCOTROL) 5 MG tablet Take 1 tablet by mouth twice a day before meals  . HYDROcodone-homatropine (HYDROMET) 5-1.5 MG/5ML syrup Take 5 mLs by mouth every 6 (six) hours as needed for cough.  . Insulin Glargine (LANTUS) 100 UNIT/ML Solostar Pen Inject 30 Units into the skin at bedtime.  . levalbuterol (XOPENEX) 0.63 MG/3ML nebulizer solution One vial in nebulizer four times daily as needed dx 493.00  . losartan (COZAAR) 50 MG tablet take 1 tablet by mouth once daily  . metoprolol (LOPRESSOR) 50 MG tablet take 1 tablet by mouth twice a day  . NEXIUM 40 MG capsule take 1 capsule by mouth once daily  . polyethylene glycol powder (GLYCOLAX/MIRALAX) powder take 17GM (DISSOLVED IN WATER) by mouth once daily  . RA LORATADINE 10 MG tablet take 1 tablet by mouth once daily  . rosuvastatin (CRESTOR) 10 MG tablet Take 1 tablet (10 mg total) by mouth daily.  Marland Kitchen spironolactone (ALDACTONE) 25 MG tablet take 1/2 tablet by mouth once daily  . torsemide (DEMADEX) 10 MG tablet take 2 tablets by mouth once daily or as directed  . warfarin (COUMADIN) 5 MG tablet take 1 and 1/2 tablets by mouth once daily OR as directed  . XOPENEX HFA 45 MCG/ACT inhaler inhale 1 to 2 puffs by mouth INTO THE LUNGS every 4 hours if needed for wheezing

## 2014-01-05 NOTE — Assessment & Plan Note (Signed)
Mod persistent asthma stable Plan Flu shot given A sleep study will be done No change in medications Return 6 months

## 2014-01-05 NOTE — Assessment & Plan Note (Signed)
Not compliant with cpap ? Needs new device and adjusted pressures Plan Repeat sleep study, split night

## 2014-01-09 ENCOUNTER — Ambulatory Visit (INDEPENDENT_AMBULATORY_CARE_PROVIDER_SITE_OTHER): Payer: Medicare Other | Admitting: Cardiovascular Disease

## 2014-01-09 ENCOUNTER — Encounter: Payer: Self-pay | Admitting: Cardiovascular Disease

## 2014-01-09 ENCOUNTER — Ambulatory Visit (INDEPENDENT_AMBULATORY_CARE_PROVIDER_SITE_OTHER): Payer: Medicare Other | Admitting: *Deleted

## 2014-01-09 VITALS — BP 128/70 | HR 81 | Resp 16 | Ht 67.0 in | Wt 194.3 lb

## 2014-01-09 DIAGNOSIS — I4581 Long QT syndrome: Secondary | ICD-10-CM

## 2014-01-09 DIAGNOSIS — Z5181 Encounter for therapeutic drug level monitoring: Secondary | ICD-10-CM | POA: Diagnosis not present

## 2014-01-09 DIAGNOSIS — Z7901 Long term (current) use of anticoagulants: Secondary | ICD-10-CM

## 2014-01-09 DIAGNOSIS — I4891 Unspecified atrial fibrillation: Secondary | ICD-10-CM

## 2014-01-09 DIAGNOSIS — Z79899 Other long term (current) drug therapy: Secondary | ICD-10-CM | POA: Diagnosis not present

## 2014-01-09 DIAGNOSIS — Z95810 Presence of automatic (implantable) cardiac defibrillator: Secondary | ICD-10-CM

## 2014-01-09 DIAGNOSIS — E7800 Pure hypercholesterolemia, unspecified: Secondary | ICD-10-CM

## 2014-01-09 DIAGNOSIS — I5022 Chronic systolic (congestive) heart failure: Secondary | ICD-10-CM | POA: Diagnosis not present

## 2014-01-09 DIAGNOSIS — G4730 Sleep apnea, unspecified: Secondary | ICD-10-CM

## 2014-01-09 DIAGNOSIS — E669 Obesity, unspecified: Secondary | ICD-10-CM

## 2014-01-09 DIAGNOSIS — I10 Essential (primary) hypertension: Secondary | ICD-10-CM

## 2014-01-09 LAB — MDC_IDC_ENUM_SESS_TYPE_INCLINIC
Brady Statistic AP VS Percent: 95.94 %
Brady Statistic AS VP Percent: 0.03 %
Brady Statistic AS VS Percent: 3.68 %
HIGH POWER IMPEDANCE MEASURED VALUE: 399 Ohm
HighPow Impedance: 190 Ohm
HighPow Impedance: 75 Ohm
Lead Channel Impedance Value: 475 Ohm
Lead Channel Pacing Threshold Amplitude: 0.375 V
Lead Channel Sensing Intrinsic Amplitude: 1 mV
Lead Channel Sensing Intrinsic Amplitude: 1.5 mV
Lead Channel Sensing Intrinsic Amplitude: 13.375 mV
Lead Channel Setting Pacing Amplitude: 2.5 V
Lead Channel Setting Pacing Pulse Width: 0.4 ms
Lead Channel Setting Sensing Sensitivity: 0.3 mV
MDC IDC MSMT BATTERY VOLTAGE: 3.07 V
MDC IDC MSMT LEADCHNL RA IMPEDANCE VALUE: 456 Ohm
MDC IDC MSMT LEADCHNL RA PACING THRESHOLD PULSEWIDTH: 0.4 ms
MDC IDC MSMT LEADCHNL RV PACING THRESHOLD AMPLITUDE: 0.5 V
MDC IDC MSMT LEADCHNL RV PACING THRESHOLD PULSEWIDTH: 0.4 ms
MDC IDC MSMT LEADCHNL RV SENSING INTR AMPL: 11.5 mV
MDC IDC SESS DTM: 20150929130031
MDC IDC SET LEADCHNL RA PACING AMPLITUDE: 2 V
MDC IDC SET ZONE DETECTION INTERVAL: 330 ms
MDC IDC SET ZONE DETECTION INTERVAL: 400 ms
MDC IDC STAT BRADY AP VP PERCENT: 0.35 %
MDC IDC STAT BRADY RA PERCENT PACED: 96.29 %
MDC IDC STAT BRADY RV PERCENT PACED: 0.38 %
Zone Setting Detection Interval: 270 ms
Zone Setting Detection Interval: 360 ms

## 2014-01-09 LAB — POCT INR: INR: 3.1

## 2014-01-09 NOTE — Patient Instructions (Addendum)
Your physician recommends that you return for lab work in: November 2015.  You do not need to fast for this lab work.   Remote monitoring is used to monitor your  ICD from home. This monitoring reduces the number of office visits required to check your device to one time per year. It allows [place] to keep an eye on the functioning of your device to ensure it is working properly. You are scheduled for a device check from home on 04-12-2014. You may send your transmission at any time that day. If you have a wireless device, the transmission will be sent automatically. After your physician reviews your transmission, you will receive a postcard with your next transmission date.  Your physician recommends that you schedule a follow-up appointment in: 6 months with Dr.Croitoru

## 2014-01-11 ENCOUNTER — Encounter: Payer: Self-pay | Admitting: Cardiovascular Disease

## 2014-01-11 NOTE — Progress Notes (Signed)
Patient ID: [name], female   DOB: 1950-10-30, 63 y.o.   MRN: [id number]     Reason for office visit Defibrillator check, chronic systolic heart failure, atrial fibrillation  She has a history of moderate nonischemic cardiomyopathy (no coronary disease by cardiac catheterization January 2012), recurrent paroxysmal atrial fibrillation status post 2 ablation procedures (Dr. Orson Aloe), with history of ventricular fibrillation arrest while on treatment with diltiazem and dofetilide and a prolonged QT interval, probably related to simultaneous quinolone therapy. She has a dual-chamber Medtronic Protecta defibrillator implanted in January 2012. She is obese and has obstructive sleep apnea with limited compliance with CPAP.  She has very little in the way of complaints today. Yesterday she had a hot flash-like event diaphoresis that lasted for about 5 minutes. There is no corresponding arrhythmia around that time on her defibrillator check.  Interrogation of her device shows normal function. There is roughly 96% atrial pacing and only 0.4% ventricular pacing. She has not had ventricular tachycardia. 112 episodes of atrial mode switch mostly consistent with atrial fibrillation have been recorded, for an overall burden of 1.1%. The longest episode lasted for about 4 hours. She has one to 2 episodes lasting more than an hour on a roughly monthly basis. Most of the other episodes are extremely brief lasting a few seconds. The ventricular rate is between 90 and 110 beats per minute when she has atrial fibrillation, generally satisfactory control. No ventricular tachycardia arrhythmias have occurred.  She denies dyspnea, angina or lower extremity edema. Her activity level has decreased because of degenerative joint disease in the knees. She is purposely avoiding excessive walking that she was reportedly told to do so by her orthopedic physician.   Allergies  Allergen Reactions  . Avelox [Moxifloxacin Hcl  In Nacl]     Cardiac arrest per pt  . Simvastatin Other (See Comments)    myalgias  . Ace Inhibitors Cough    Dry cough     Current Outpatient Prescriptions  Medication Sig Dispense Refill  . B-D ULTRAFINE III SHORT PEN 31G X 8 MM MISC use as directed  100 each  9  . beclomethasone (QVAR) 40 MCG/ACT inhaler Inhale 2 puffs into the lungs 2 (two) times daily.  1 Inhaler  12  . buPROPion (WELLBUTRIN XL) 150 MG 24 hr tablet take 1 tablet by mouth once daily  30 tablet  8  . chlorpheniramine (CHLOR-TRIMETON) 4 MG tablet 8mg  at night  14 tablet  0  . digoxin (LANOXIN) 0.125 MG tablet take 1/2 tablet by mouth once daily  15 tablet  9  . dofetilide (TIKOSYN) 250 MCG capsule Take 250 mcg by mouth 2 (two) times daily.      . fluticasone (FLONASE) 50 MCG/ACT nasal spray instill 1 spray into each nostril twice a day  16 g  5  . glipiZIDE (GLUCOTROL) 5 MG tablet Take 1 tablet by mouth twice a day before meals  60 tablet  0  . HYDROcodone-homatropine (HYDROMET) 5-1.5 MG/5ML syrup Take 5 mLs by mouth every 6 (six) hours as needed for cough.  240 mL  0  . Insulin Glargine (LANTUS) 100 UNIT/ML Solostar Pen Inject 30 Units into the skin at bedtime.  15 mL  2  . levalbuterol (XOPENEX) 0.63 MG/3ML nebulizer solution One vial in nebulizer four times daily as needed dx 493.00  360 mL  4  . losartan (COZAAR) 50 MG tablet take 1 tablet by mouth once daily  90 tablet  3  . metoprolol (  LOPRESSOR) 50 MG tablet take 1 tablet by mouth twice a day  60 tablet  11  . NEXIUM 40 MG capsule take 1 capsule by mouth once daily  30 capsule  6  . polyethylene glycol powder (GLYCOLAX/MIRALAX) powder take 17GM (DISSOLVED IN WATER) by mouth once daily  255 g  5  . RA LORATADINE 10 MG tablet take 1 tablet by mouth once daily  30 tablet  5  . rosuvastatin (CRESTOR) 10 MG tablet Take 1 tablet (10 mg total) by mouth daily.  90 tablet  2  . spironolactone (ALDACTONE) 25 MG tablet take 1/2 tablet by mouth once daily  30 tablet  6  .  torsemide (DEMADEX) 10 MG tablet take 2 tablets by mouth once daily or as directed  70 tablet  5  . warfarin (COUMADIN) 5 MG tablet take 1 and 1/2 tablets by mouth once daily OR as directed  45 tablet  5  . XOPENEX HFA 45 MCG/ACT inhaler inhale 1 to 2 puffs by mouth INTO THE LUNGS every 4 hours if needed for wheezing  15 g  0   No current facility-administered medications for this visit.    Past Medical History  Diagnosis Date  . Atrial fibrillation     ablation x 2 [facility], 01/2006, 2011.  on warfarin  . Hyperlipidemia   . Depression   . Anxiety   . Diabetes mellitus   . Anemia   . Arthritis   . GERD (gastroesophageal reflux disease)   . Sleep apnea   . Vocal cord disease   . CHF (congestive heart failure)     Echo 08/08/10 by [facility]. EF 35-45%. LV systolic function moderately reduced. Moderate global hypokinesis of LV.  RV systolic function moderately reduced. Mild MR. Trace AR.  Marland Kitchen Cardiac arrest jan 2012    in hospital for pneumonia when this occured- occured at the hospital  . GASTROESOPHAGEAL REFLUX, NO ESOPHAGITIS 06/10/2006    Qualifier: Diagnosis of  By: Abundio Miu    . Asthma     has had multiple hospitalizations for this  . Non-ischemic cardiomyopathy     echo 08/08/10 - EF 35-45% LV and RV systolic function mod reduced  . Limb pain 06/04/2008    LLE, Baker's cyst in popliteal fossa, no DVT    Past Surgical History  Procedure Laterality Date  . Cholecystectomy    . Knee arthroscopy    . Cardiac defibrillator placement  05/02/10    Medtronic Protecta XT-DR for CHF-VT, last download 04/12/12  . Paf ablation      By Dr Sampson Goon. Now sees Dr Steele Berg at [facility] History  Problem Relation Age of Onset  . Diabetes Father   . Hypertension Father     History   Social History  . Marital Status: Widowed    Spouse Name: N/A    Number of Children: N/A  . Years of Education: N/A   Occupational History  . Not on file.   Social History Main  Topics  . Smoking status: Never Smoker   . Smokeless tobacco: Never Used  . Alcohol Use: No  . Drug Use: No  . Sexual Activity: No   Other Topics Concern  . Not on file   Social History Narrative   Not employed   Exercise- walking 1 hour daily.    Diet- eating healthy diet.     Review of systems: The patient specifically denies any chest pain at rest or  with exertion, dyspnea at rest or with exertion, orthopnea, paroxysmal nocturnal dyspnea, syncope, palpitations, focal neurological deficits, intermittent claudication, lower extremity edema, unexplained weight gain, cough, hemoptysis or wheezing.  The patient also denies abdominal pain, nausea, vomiting, dysphagia, diarrhea, constipation, polyuria, polydipsia, dysuria, hematuria, frequency, urgency, abnormal bleeding or bruising, fever, chills, unexpected weight changes, mood swings, change in skin or hair texture, change in voice quality, auditory or visual problems, allergic reactions or rashes, new musculoskeletal complaints other than usual "aches and pains".   PHYSICAL EXAM BP 128/70  Pulse 81  Resp 16  Ht 5\' 7"  (1.702 m)  Wt 88.134 kg (194 lb 4.8 oz)  BMI 30.42 kg/m2  LMP 07/26/2011 General: Alert, oriented x3, no distress, mildly obese  Head: no evidence of trauma, PERRL, EOMI, no exophtalmos or lid lag, no myxedema, no xanthelasma; normal ears, nose and oropharynx  Neck: normal jugular venous pulsations and no hepatojugular reflux; brisk carotid pulses without delay and no carotid bruits  Chest: clear to auscultation, no signs of consolidation by percussion or palpation, normal fremitus, symmetrical and full respiratory excursions, healthy appearance of the ICD site  Cardiovascular: normal position and quality of the apical impulse, regular rhythm, normal first and second heart sounds, no murmurs, rubs or gallops  Abdomen: no tenderness or distention, no masses by palpation, no abnormal pulsatility or arterial bruits, normal  bowel sounds, no hepatosplenomegaly  Extremities: no clubbing, cyanosis or edema; 2+ radial, ulnar and brachial pulses bilaterally; 2+ right femoral, posterior tibial and dorsalis pedis pulses; 2+ left femoral, posterior tibial and dorsalis pedis pulses; no subclavian or femoral bruits  Neurological: grossly nonfocal   EKG: Atrial paced ventricular sensed, mildly prolonged QT interval at 471 ms, consistent with dofetilide effect, no ischemic changes.   Lipid Panel     Component Value Date/Time   CHOL 130 08/30/2013 0824   TRIG 96 08/30/2013 0824   HDL 43 08/30/2013 0824   CHOLHDL 3.0 08/30/2013 0824   VLDL 19 08/30/2013 0824   LDLCALC 68 08/30/2013 0824   LDLDIRECT 94 12/07/2007 2019    BMET    Component Value Date/Time   NA 139 08/30/2013 0824   K 4.1 08/30/2013 0824   CL 100 08/30/2013 0824   CO2 31 08/30/2013 0824   GLUCOSE 126* 08/30/2013 0824   BUN 12 08/30/2013 0824   CREATININE 1.08 08/30/2013 0824   CREATININE 1.07 07/26/2011 1123   CALCIUM 9.1 08/30/2013 0824   GFRNONAA 55* 07/26/2011 1123   GFRAA 64* 07/26/2011 1123     ASSESSMENT AND PLAN Atrial fibrillation, paroxysmal Status post initial 2012 radiofrequency ablation with recurrence of atrial fibrillation and repeat ablation in 2013. Now on Tikosyn, metoprolol and digoxin with occasional brief and asymptomatic recurrences of paroxysmal atrial fibrillation lasting up to 4 hours. She has no awareness of the arrhythmia. Overall arrhythmia burden is a low (1.1%). She should remain on lifelong anticoagulation. During episodes of atrial fibrillation rate control is acceptable. QTc interval is 471 ms, within the desirable range.  She was reminded again of the risk of drug interactions with dofetilide and warfarin. Chronic systolic heart failure  She is well compensated heart failure. Her optivol tracks in the normal range. NYHA functional class II. Note objective evidence of decreased activity level as monitored by her defibrillator  sensor. This is because of joint problems. She appears to be clinically euvolemic. She is on chronic loop diuretic therapy, as well as appropriate angiotensin receptor blocker and beta blocker therapy and Aldactone.  APNEA, SLEEP  Compliance with CPAP is mediocre. Reviewed need for improved use of the device.  HYPERTENSION, BENIGN SYSTEMIC  Good control  DIABETES MELLITUS II, UNCOMPLICATED  CKD (chronic kidney disease) stage 2, GFR 60-89 ml/min  ICD (St. Jude Protecta dual-chamber),secondary prevention (VF arrest) January 2012  [facility]. Normal device function. No discharges. Records recurrent episodes of asymptomatic paroxysmal atrial fibrillation. Atrial pacing roughly 96%, very rare ventricular pacing.  HYPERCHOLESTEROLEMIA  Satisfactory lipid profile. She has diabetes but no known vascular or coronary problems.  Orders Placed This Encounter  Procedures  . Comprehensive metabolic panel  . Implantable device check  . EKG 12-Lead   No orders of the defined types were placed in this encounter.    Junious SilkROITORU,Jazaria Jarecki  Jakeria Caissie, MD, [facility] [phone] office [phone] pager

## 2014-01-12 ENCOUNTER — Ambulatory Visit (INDEPENDENT_AMBULATORY_CARE_PROVIDER_SITE_OTHER): Payer: Medicare Other | Admitting: Family Medicine

## 2014-01-12 ENCOUNTER — Encounter: Payer: Self-pay | Admitting: Family Medicine

## 2014-01-12 VITALS — BP 123/75 | HR 82 | Temp 98.0°F | Ht 67.0 in | Wt 193.0 lb

## 2014-01-12 DIAGNOSIS — E119 Type 2 diabetes mellitus without complications: Secondary | ICD-10-CM

## 2014-01-12 DIAGNOSIS — E1140 Type 2 diabetes mellitus with diabetic neuropathy, unspecified: Secondary | ICD-10-CM | POA: Diagnosis not present

## 2014-01-12 DIAGNOSIS — M79672 Pain in left foot: Secondary | ICD-10-CM | POA: Diagnosis not present

## 2014-01-12 DIAGNOSIS — IMO0002 Reserved for concepts with insufficient information to code with codable children: Secondary | ICD-10-CM

## 2014-01-12 DIAGNOSIS — E1165 Type 2 diabetes mellitus with hyperglycemia: Secondary | ICD-10-CM | POA: Diagnosis not present

## 2014-01-12 DIAGNOSIS — M79673 Pain in unspecified foot: Secondary | ICD-10-CM | POA: Insufficient documentation

## 2014-01-12 LAB — POCT GLYCOSYLATED HEMOGLOBIN (HGB A1C): Hemoglobin A1C: 9.1

## 2014-01-12 MED ORDER — GLIPIZIDE 5 MG PO TABS: ORAL_TABLET | ORAL | Status: DC

## 2014-01-12 NOTE — Assessment & Plan Note (Signed)
-   Suspected to be due to Diabetic Neuropathy vs. Heel Spur vs. Atypical Presentation of Plantar Fasciitis - Instructed to buy Aspercreme over the counter to aid with pain - Instructed to ice foot as needed - If no improvement, consider systemic treatment or Xrays to further evaluate.  - Re-evaluate when return in one month

## 2014-01-12 NOTE — Patient Instructions (Signed)
Thank you so much for coming to see me today! Your blood sugar isn't quite as well controlled as we would like.  I have two options for you to try to get it under better control: 1. You may increase your Lantus by 1U/day on mornings when your blood sugar is greater than 150 in the morning 2. You may keep a record of your blood sugars and call us in one week so we can make changes to your Lantus dose. Don't forget that exercise and diet are essential in diabetes management.  I know it can be hard to exercise with your foot hurting.  You may try Aspercreme to see if it helps with the pain.  Continue to ice it as needed.  Please come back to see me in one month so we can see how your sugars and foot are doing!  Thanks again! Dr. Caroleen Hamman

## 2014-01-12 NOTE — Progress Notes (Signed)
Subjective:     Patient ID: [name], female   DOB: Oct 08, 1950, 63 y.o.   MRN: [id number]  HPI Mrs. Eckelman is a 63yo female presenting today for diabetes checkup.    #Diabetes - Eye exam on  10/23/13 showed Hypertensive Retinopathy. - A1C 9.1 today, down from 9.2 at last visit. - Currently taking Glipizide 5mg  BID and Lantus 30U at bedtime.  - Needs refill of Glipizide today - Does not have written record of blood sugars - Mostly checks blood sugar in morning and it is generally between 119 and 133. - When she does check it at night, she reports that it is generally around 180. - Needs prescription for Orthopedic Shoes sent to [facility]  #Left Heel Pain - Has history of intermittent left heel pain with acute flare over last week. - Pain is located over plantar surface of heel and around medial side. - Pain is constant throughout the day and is not worse when first getting up in the morning or rising from a seated position. - Worse with walking - Has not tried any medications so far due to her CHF and CKD.  - She has not seen an orthopedic doctor for this in the past - Has not had xrays of foot before  #HTN - Well controlled today at 123/75  Review of Systems  All other systems reviewed and are negative.      Objective:   Physical Exam  Constitutional: She appears well-developed and well-nourished. No distress.  HENT:  Head: Normocephalic and atraumatic.  Cardiovascular: Normal rate, regular rhythm, normal heart sounds and intact distal pulses.  Exam reveals no gallop and no friction rub.   No murmur heard. Pulmonary/Chest: Effort normal and breath sounds normal. No respiratory distress. She has no wheezes. She has no rales. She exhibits no tenderness.  Abdominal: Soft. Bowel sounds are normal. She exhibits no distension and no mass. There is no tenderness. There is no rebound and no guarding.  Musculoskeletal: She exhibits no edema.  Tenderness noted over plantar surface of  left heel and around medial side of heel. Diabetic foot exam showed decreased sensation diffusely throughout feet. Calluses noted over feet bilaterally.   Skin: Skin is warm and dry. No rash noted. She is not diaphoretic.  Psychiatric: She has a normal mood and affect. Her behavior is normal.       Assessment:     Please see Problem List for Assessment.    Plan:     Please see Problem List for Plan.

## 2014-01-12 NOTE — Assessment & Plan Note (Addendum)
-   A1C 9.1 today. Goal less than 8. Recheck in one month. - Refill of Glipizide 5mg  BID given. - Instructed to keep log of blood sugars and bring it to her next visit. - Instructed to increase her Lantus dose by 1U/day until sugar is less than 150 in the morning.  Instructed her to let us know if she has any hypoglycemic episodes and to let us know if she has to increase her dose to more than 36U/day.  Instructed her to call us if she had any questions about how much to increase her Lantus. - Prescription needed for Diabetic Shoes.  Contact [facility] for form. - Return in one month to re-evaluate Lantus dose and blood sugar levels with log

## 2014-01-15 ENCOUNTER — Other Ambulatory Visit: Payer: Self-pay | Admitting: *Deleted

## 2014-01-15 MED ORDER — LOSARTAN POTASSIUM 50 MG PO TABS: ORAL_TABLET | ORAL | Status: DC

## 2014-01-18 ENCOUNTER — Encounter: Payer: Self-pay | Admitting: Internal Medicine

## 2014-01-22 ENCOUNTER — Other Ambulatory Visit: Payer: Self-pay | Admitting: *Deleted

## 2014-01-22 MED ORDER — INSULIN GLARGINE 100 UNIT/ML SOLOSTAR PEN: 30.0000 [IU] | PEN_INJECTOR | Freq: Every day | SUBCUTANEOUS | Status: DC

## 2014-01-25 ENCOUNTER — Encounter: Payer: Self-pay | Admitting: Podiatry

## 2014-01-25 ENCOUNTER — Other Ambulatory Visit: Payer: Self-pay | Admitting: Podiatry

## 2014-01-25 ENCOUNTER — Ambulatory Visit (INDEPENDENT_AMBULATORY_CARE_PROVIDER_SITE_OTHER): Payer: Medicare Other

## 2014-01-25 ENCOUNTER — Ambulatory Visit (INDEPENDENT_AMBULATORY_CARE_PROVIDER_SITE_OTHER): Payer: Medicare Other | Admitting: Podiatry

## 2014-01-25 VITALS — BP 134/85 | HR 82 | Resp 16 | Ht 67.0 in | Wt 194.0 lb

## 2014-01-25 DIAGNOSIS — L84 Corns and callosities: Secondary | ICD-10-CM

## 2014-01-25 DIAGNOSIS — E1151 Type 2 diabetes mellitus with diabetic peripheral angiopathy without gangrene: Secondary | ICD-10-CM

## 2014-01-25 DIAGNOSIS — Q828 Other specified congenital malformations of skin: Secondary | ICD-10-CM

## 2014-01-25 DIAGNOSIS — M779 Enthesopathy, unspecified: Secondary | ICD-10-CM

## 2014-01-25 DIAGNOSIS — M79672 Pain in left foot: Secondary | ICD-10-CM | POA: Diagnosis not present

## 2014-01-25 DIAGNOSIS — M722 Plantar fascial fibromatosis: Secondary | ICD-10-CM | POA: Diagnosis not present

## 2014-01-25 MED ORDER — TRIAMCINOLONE ACETONIDE 10 MG/ML IJ SUSP
10.0000 mg | Freq: Once | INTRAMUSCULAR | Status: AC
  Administered 2014-01-25: 10 mg

## 2014-01-25 NOTE — Progress Notes (Signed)
Subjective:     Patient ID: [name], female   DOB: [birth year], 63 y.o.   MRN: [id number]  Foot Pain   patient presents stating I have had a lot of pain in my left heel for the last month and I have bad calluses on both my feet I cannot take care of myself and become painful area does not remember specific injury states the pain is worse in the morning and she has tried more supportive shoe gear   Review of Systems  All other systems reviewed and are negative.      Objective:   Physical Exam  Nursing note and vitals reviewed. Constitutional: She is oriented to person, place, and time.  Cardiovascular: Intact distal pulses.   Musculoskeletal: Normal range of motion.  Neurological: She is oriented to person, place, and time.  Skin: Skin is warm.   neurovascular status is intact with muscle strength adequate and range of motion of the subtalar and midtarsal joint within normal limits. Mild equinus condition is noted and there is good digital perfusion with moderate depression of the arch upon weightbearing. Patient is noted to have discomfort in the plantar heel left at the insertion of the tendon into the calcaneus and is noted to have keratotic lesion sub-1 sub-5 of both feet     Assessment:     Plantar fasciitis of the heel left and callus formation secondary to foot structure    Plan:     H&P and x-rays reviewed. Injected the left plantar fascia 3 mg Kenalog 5 mg Xylocaine and applied fascially brace with instructions. Gave instructions on supportive shoes and physical therapy and reappoint in 1 week and also debrided lesions

## 2014-01-25 NOTE — Progress Notes (Signed)
   Subjective:    Patient ID: [name], female    DOB: 03-29-1951, 63 y.o.   MRN: [id number]  HPI Comments: "My heel hurts"  Patient c/o aching plantar heel left for about 1 month. She does have AM pain. No home treatment other than resting.  Also, patient states that she has calluses plantar forefoot bilateral, right over left and wants them trimmed.  Foot Pain Associated symptoms include arthralgias.      Review of Systems  Respiratory: Positive for wheezing.   Cardiovascular:       Calf pain with walking  Musculoskeletal: Positive for arthralgias and gait problem.  Hematological: Bruises/bleeds easily.  All other systems reviewed and are negative.      Objective:   Physical Exam        Assessment & Plan:

## 2014-01-25 NOTE — Patient Instructions (Signed)

## 2014-01-26 ENCOUNTER — Telehealth: Payer: Self-pay | Admitting: Critical Care Medicine

## 2014-01-26 ENCOUNTER — Ambulatory Visit (INDEPENDENT_AMBULATORY_CARE_PROVIDER_SITE_OTHER): Payer: Medicare Other | Admitting: Pharmacist Clinician (PhC)/ Clinical Pharmacy Specialist

## 2014-01-26 VITALS — BP 117/80 | Wt 195.0 lb

## 2014-01-26 DIAGNOSIS — Z7901 Long term (current) use of anticoagulants: Secondary | ICD-10-CM | POA: Diagnosis not present

## 2014-01-26 DIAGNOSIS — I4891 Unspecified atrial fibrillation: Secondary | ICD-10-CM

## 2014-01-26 LAB — POCT INR: INR: 4.7

## 2014-01-26 NOTE — Telephone Encounter (Signed)
Spoke with the pt  She states that the loratidine price went up  She is requesting alternative for this  Please advise thanks

## 2014-01-27 NOTE — Telephone Encounter (Signed)
Try otc zyrtec/ allergra  Use [facility]

## 2014-01-29 ENCOUNTER — Other Ambulatory Visit: Payer: Self-pay | Admitting: Pharmacist Clinician (PhC)/ Clinical Pharmacy Specialist

## 2014-01-29 ENCOUNTER — Other Ambulatory Visit: Payer: Self-pay | Admitting: *Deleted

## 2014-01-29 MED ORDER — WARFARIN SODIUM 5 MG PO TABS: ORAL_TABLET | ORAL | Status: DC

## 2014-01-29 MED ORDER — METOPROLOL TARTRATE 50 MG PO TABS: ORAL_TABLET | ORAL | Status: DC

## 2014-01-29 NOTE — Telephone Encounter (Signed)
lmomtcb x1 

## 2014-01-30 NOTE — Telephone Encounter (Signed)
Pt returning call.[name] ° °

## 2014-01-30 NOTE — Telephone Encounter (Signed)
Called made pt aware of recs. Nothing further needed 

## 2014-02-01 ENCOUNTER — Encounter: Payer: Self-pay | Admitting: Podiatry

## 2014-02-01 ENCOUNTER — Ambulatory Visit (INDEPENDENT_AMBULATORY_CARE_PROVIDER_SITE_OTHER): Payer: Medicare Other | Admitting: Podiatry

## 2014-02-01 VITALS — BP 144/83 | HR 65 | Resp 16

## 2014-02-01 DIAGNOSIS — M722 Plantar fascial fibromatosis: Secondary | ICD-10-CM | POA: Diagnosis not present

## 2014-02-01 DIAGNOSIS — E1151 Type 2 diabetes mellitus with diabetic peripheral angiopathy without gangrene: Secondary | ICD-10-CM | POA: Diagnosis not present

## 2014-02-01 DIAGNOSIS — L84 Corns and callosities: Secondary | ICD-10-CM

## 2014-02-01 NOTE — Progress Notes (Signed)
Subjective:     Patient ID: [name], female   DOB: 12-Feb-1951, 63 y.o.   MRN: [id number]  HPI patient presents stating my heel is feeling quite a bit better and I'm looking for diabetic shoes as the calluses really become sore and I cannot take care of them   Review of Systems     Objective:   Physical Exam No change in neurovascular status with pain plantar heel left it's improved quite a bit upon palpation and lesions which has not reoccurred at this time    Assessment:     Plantar fasciitis which is improving and at risk diabetic with corn callus formation and diminished circulatory status noted    Plan:     Advised on physical therapy supportive shoe gear usage and stretching exercises and we will wait for authorization for diabetic shoes for this patient

## 2014-02-07 ENCOUNTER — Ambulatory Visit (INDEPENDENT_AMBULATORY_CARE_PROVIDER_SITE_OTHER): Payer: Medicare Other | Admitting: Pharmacist Clinician (PhC)/ Clinical Pharmacy Specialist

## 2014-02-07 DIAGNOSIS — Z7901 Long term (current) use of anticoagulants: Secondary | ICD-10-CM | POA: Diagnosis not present

## 2014-02-07 DIAGNOSIS — I4891 Unspecified atrial fibrillation: Secondary | ICD-10-CM

## 2014-02-07 LAB — POCT INR: INR: 2.7

## 2014-02-09 ENCOUNTER — Encounter: Payer: Self-pay | Admitting: Family Medicine

## 2014-02-09 ENCOUNTER — Ambulatory Visit (INDEPENDENT_AMBULATORY_CARE_PROVIDER_SITE_OTHER): Payer: Medicare Other | Admitting: Family Medicine

## 2014-02-09 VITALS — BP 114/82 | HR 85 | Temp 98.1°F | Ht 67.0 in | Wt 195.0 lb

## 2014-02-09 DIAGNOSIS — IMO0002 Reserved for concepts with insufficient information to code with codable children: Secondary | ICD-10-CM

## 2014-02-09 DIAGNOSIS — E1140 Type 2 diabetes mellitus with diabetic neuropathy, unspecified: Secondary | ICD-10-CM | POA: Diagnosis not present

## 2014-02-09 DIAGNOSIS — E1165 Type 2 diabetes mellitus with hyperglycemia: Secondary | ICD-10-CM | POA: Diagnosis not present

## 2014-02-09 NOTE — Assessment & Plan Note (Addendum)
-   Continue 32Units Lantus. Taking dose in morning to prevent dropping low at night.  Instructed to contact us if blood glucose becomes too low or too high with change. - Continue Glipizide - Complete form for heating pad and fax in - Return in 2 months to recheck A1C

## 2014-02-09 NOTE — Progress Notes (Signed)
Subjective:     Patient ID: KEIRSTON COVINGTON, female   DOB: 05/23/1950, 63 y.o.   MRN: [id number]  HPI Mrs. Bunn is a 63yo female presenting today for follow up on her blood glucose log and Lantus dose. - She reports taking 32 units with breakfast. - AM glucose ranges from 89 to 140, but are mostly around 110 - PM glucose ranges from 125 to 170, but are mostly around 150 - No complaints of hypoglycemia - States her foot is much improved following visit to podiatrist for injection and bracing - Would like form filled out for heating pad for pain in legs - No further complaints today  Review of Systems  Musculoskeletal: Positive for myalgias.  All other systems reviewed and are negative.      Objective:   Physical Exam  Constitutional: She is oriented to person, place, and time. She appears well-developed and well-nourished. No distress.  HENT:  Head: Normocephalic and atraumatic.  Cardiovascular: Normal rate, regular rhythm and normal heart sounds.  Exam reveals no gallop and no friction rub.   No murmur heard. Pulmonary/Chest: Effort normal and breath sounds normal. No respiratory distress. She has no wheezes. She has no rales.  Musculoskeletal: She exhibits no edema.  Neurological: She is alert and oriented to person, place, and time.  Psychiatric: She has a normal mood and affect. Her behavior is normal.       Assessment:     Please refer to Problem List for Assessment.     Plan:     Please refer to Problem List for Plan.

## 2014-02-09 NOTE — Patient Instructions (Signed)
Thank you so much for coming to visit [place] today! I think we will leave your Lantus dose at 32 Units for now.  You may consider taking your Lantus in the mornings instead of at night to avoid it dropping to low overnight. Please let me know if your sugars drop too low!  I will resend the form for your heating pad today. Please return in 2 months so we can recheck your A1C, which will tell us how your sugars have been over the past 3 months.  Thanks again! Dr. Caroleen Hamman

## 2014-02-09 NOTE — Progress Notes (Signed)
Discussed with resident.

## 2014-02-28 ENCOUNTER — Telehealth: Payer: Self-pay | Admitting: Cardiovascular Disease

## 2014-02-28 ENCOUNTER — Ambulatory Visit (INDEPENDENT_AMBULATORY_CARE_PROVIDER_SITE_OTHER): Payer: Medicare Other | Admitting: Pharmacist Clinician (PhC)/ Clinical Pharmacy Specialist

## 2014-02-28 ENCOUNTER — Other Ambulatory Visit: Payer: Self-pay | Admitting: *Deleted

## 2014-02-28 DIAGNOSIS — I4891 Unspecified atrial fibrillation: Secondary | ICD-10-CM

## 2014-02-28 DIAGNOSIS — Z7901 Long term (current) use of anticoagulants: Secondary | ICD-10-CM

## 2014-02-28 DIAGNOSIS — Z79899 Other long term (current) drug therapy: Secondary | ICD-10-CM | POA: Diagnosis not present

## 2014-02-28 LAB — COMPREHENSIVE METABOLIC PANEL
ALT: 25 U/L (ref 0–35)
AST: 19 U/L (ref 0–37)
Albumin: 3.6 g/dL (ref 3.5–5.2)
Alkaline Phosphatase: 94 U/L (ref 39–117)
BILIRUBIN TOTAL: 0.4 mg/dL (ref 0.2–1.2)
BUN: 15 mg/dL (ref 6–23)
CO2: 26 meq/L (ref 19–32)
Calcium: 8.3 mg/dL — ABNORMAL LOW (ref 8.4–10.5)
Chloride: 99 mEq/L (ref 96–112)
Creat: 1.11 mg/dL — ABNORMAL HIGH (ref 0.50–1.10)
GLUCOSE: 290 mg/dL — AB (ref 70–99)
Potassium: 4.4 mEq/L (ref 3.5–5.3)
SODIUM: 135 meq/L (ref 135–145)
TOTAL PROTEIN: 7 g/dL (ref 6.0–8.3)

## 2014-02-28 LAB — POCT INR: INR: 3.2

## 2014-02-28 NOTE — Telephone Encounter (Signed)
Spoke to Katrina  needed a lab slip for lab  RN released CMP from future orders.

## 2014-03-12 ENCOUNTER — Ambulatory Visit (HOSPITAL_BASED_OUTPATIENT_CLINIC_OR_DEPARTMENT_OTHER): Payer: Medicare Other | Attending: Critical Care Medicine | Admitting: Radiology

## 2014-03-12 VITALS — Ht 67.0 in | Wt 194.0 lb

## 2014-03-12 DIAGNOSIS — G4733 Obstructive sleep apnea (adult) (pediatric): Secondary | ICD-10-CM | POA: Diagnosis not present

## 2014-03-12 DIAGNOSIS — G4730 Sleep apnea, unspecified: Secondary | ICD-10-CM

## 2014-03-14 ENCOUNTER — Ambulatory Visit (INDEPENDENT_AMBULATORY_CARE_PROVIDER_SITE_OTHER): Payer: Medicare Other | Admitting: Pharmacist Clinician (PhC)/ Clinical Pharmacy Specialist

## 2014-03-14 DIAGNOSIS — Z7901 Long term (current) use of anticoagulants: Secondary | ICD-10-CM

## 2014-03-14 DIAGNOSIS — I4891 Unspecified atrial fibrillation: Secondary | ICD-10-CM

## 2014-03-14 LAB — POCT INR: INR: 3.3

## 2014-03-16 DIAGNOSIS — G4730 Sleep apnea, unspecified: Secondary | ICD-10-CM | POA: Diagnosis not present

## 2014-03-17 NOTE — Sleep Study (Signed)
[facility]  NAME: [name]  DATE OF BIRTH: 26-Jun-1950  MEDICAL RECORD NUMBER [id number]  LOCATION: [facility]  PHYSICIAN: Stpehanie Montroy V.  DATE OF STUDY: 03/12/14  SLEEP STUDY TYPE: Split Polysomnogram               REFERRING PHYSICIAN: Oretha Milch, MD  INDICATION FOR STUDY: 63 year old woman with atrial fibrillation and known OSA, poorly compliant with sleep apnea. She has lost weight about 40 pounds. At the time of this study ,they weighed 194 pounds with a height of  5 ft 7 inches and the BMI of 30, neck size of 16 inches. Epworth sleepiness score was 3   This intervention polysomnogram was performed with a sleep technologist in attendance. EEG, EOG,EMG and respiratory parameters recorded. Sleep stages, arousals, limb movements and respiratory data was scored according to criteria laid out by the American Academy of sleep medicine.  SLEEP ARCHITECTURE: Lights out was at 2154 PM and lights on was at 441 AM. During the baseline portion ,total sleep time was 138 minutes with sleep latency of 5 minutes with wake after sleep onset of 24 minutes. Sleep efficiency was poor at 82% with frequent long periods of awakening. Sleep stages as a percentage of total sleep time was N1 17 N2- 83 % and REM sleep 0 % ( 0 minutes) . During titration REM sleep accounted for 40 minutes and supine sleep was noted .The longest period of REM sleep was around 2 AM.   AROUSAL DATA : At baseline ,there were 219 arousals with an arousal index of 95 events per hour. Of these 103 were spontaneous, and 114 were associated with respiratory events and to were associated periodic limb movements. During titration, the arousal index was 30  events per hour  RESPIRATORY DATA: During the baseline portion,there were 85 obstructive apneas, 0 central apneas, 0 mixed apneas and 35 hypopneas with apnea -hypopnea index of 52 events per hour.  There was no relation to sleep stage or body  position. Due to this degree of respiratory disturbance CPAP was initiated at 5 cm and titrated to find a level of 8 cm due to persistent events during supine sleep. At the level of 8 cm for 20 minutes of sleep, 0 apneas and 0 hypopneas is were noted. Titration was optimal  MOVEMENT/PARASOMNIA: There were 7 PLMS with a PLM index of 3 events per hour. The PLM arousal index was 0.4 events per hour. PLMS seemed to persist during titration.  OXYGEN DATA: The lowest desaturation was 91 % during non-REM sleep and the desaturation index was 10 per hour. This was corrected by titration and the saturations stayed below 88% for 0 minutes during titration.  CARDIAC DATA: The low heart rate was 31 beats per minute. The high heart rate recorded was an artifact. No arrhythmias were noted   IMPRESSION :  1. Severe* obstructive sleep apnea with hypopneas causing sleep fragmentation and mild oxygen desaturation. 2. This was corrected by CPAP of 8 centimeters with nasal pillows. Titration was optimal 3. No evidence of cardiac arrhythmias or behavioral disturbance during sleep. Periodic limb movements were noted -significance unclear.  RECOMMENDATION:    1. The treatment options for this degree of sleep disordered breathing includes weight loss and CPAP therapy. 2. CPAP can be initiated at 8 centimeters with nasal pillows and compliance monitored at this level. 3. Patient should be cautioned against driving when sleepy.They should be asked to avoid medications with sedative  side effects  Oretha MilchALVA,Delayla Hoffmaster V. MD Diplomate, American Board of Sleep Medicine  ELECTRONICALLY SIGNED ON:  03/17/2014  [facility] PH: [phone]   FX: [phone] ACCREDITED BY THE AMERICAN ACADEMY OF SLEEP MEDICINE

## 2014-03-20 ENCOUNTER — Telehealth: Payer: Self-pay | Admitting: Critical Care Medicine

## 2014-03-20 DIAGNOSIS — G4730 Sleep apnea, unspecified: Secondary | ICD-10-CM

## 2014-03-20 NOTE — Telephone Encounter (Signed)
Let pt know sleep study showed severe sleep apnea Order a new cpap machine and nasal pillows  Setting of 8cmh20 heated humidity Pt uses [facility]

## 2014-03-21 NOTE — Telephone Encounter (Signed)
Called, spoke with pt.  Discussed below results and recs per Dr. Delford Field.  She verbalized understanding and is aware order sent to [facility].  Pt is to call office within the next wk if she doesn't hear anything to have new cpap set up from DME.  Pt verbalized understanding of results and recs, is in agreement with plan, and voiced no further questions or concerns at this time.

## 2014-03-26 ENCOUNTER — Ambulatory Visit (INDEPENDENT_AMBULATORY_CARE_PROVIDER_SITE_OTHER): Payer: Medicare Other | Admitting: Pharmacist Clinician (PhC)/ Clinical Pharmacy Specialist

## 2014-03-26 DIAGNOSIS — Z7901 Long term (current) use of anticoagulants: Secondary | ICD-10-CM | POA: Diagnosis not present

## 2014-03-26 DIAGNOSIS — I4891 Unspecified atrial fibrillation: Secondary | ICD-10-CM | POA: Diagnosis not present

## 2014-03-26 LAB — POCT INR: INR: 2.2

## 2014-04-10 ENCOUNTER — Other Ambulatory Visit: Payer: Self-pay

## 2014-04-10 DIAGNOSIS — Z1231 Encounter for screening mammogram for malignant neoplasm of breast: Secondary | ICD-10-CM

## 2014-04-12 ENCOUNTER — Ambulatory Visit (INDEPENDENT_AMBULATORY_CARE_PROVIDER_SITE_OTHER): Payer: Medicare Other | Admitting: *Deleted

## 2014-04-12 DIAGNOSIS — I4581 Long QT syndrome: Secondary | ICD-10-CM

## 2014-04-12 DIAGNOSIS — I5022 Chronic systolic (congestive) heart failure: Secondary | ICD-10-CM

## 2014-04-12 NOTE — Progress Notes (Signed)
Remote ICD transmission.   

## 2014-04-17 ENCOUNTER — Other Ambulatory Visit: Payer: Self-pay | Admitting: *Deleted

## 2014-04-17 MED ORDER — ROSUVASTATIN CALCIUM 10 MG PO TABS: 10.0000 mg | ORAL_TABLET | Freq: Every day | ORAL | Status: DC

## 2014-04-19 ENCOUNTER — Other Ambulatory Visit: Payer: Self-pay | Admitting: *Deleted

## 2014-04-19 LAB — MDC_IDC_ENUM_SESS_TYPE_REMOTE
Battery Voltage: 3.06 V
Brady Statistic AP VP Percent: 0.3 %
Brady Statistic AS VP Percent: 0.03 %
Brady Statistic RA Percent Paced: 96.35 %
Brady Statistic RV Percent Paced: 0.34 %
Date Time Interrogation Session: 20151231140521
HIGH POWER IMPEDANCE MEASURED VALUE: 361 Ohm
HIGH POWER IMPEDANCE MEASURED VALUE: 80 Ohm
Lead Channel Impedance Value: 456 Ohm
Lead Channel Pacing Threshold Amplitude: 0.375 V
Lead Channel Pacing Threshold Pulse Width: 0.4 ms
Lead Channel Sensing Intrinsic Amplitude: 1.75 mV
Lead Channel Setting Pacing Amplitude: 2 V
Lead Channel Setting Pacing Amplitude: 2.5 V
Lead Channel Setting Sensing Sensitivity: 0.3 mV
MDC IDC MSMT LEADCHNL RV IMPEDANCE VALUE: 475 Ohm
MDC IDC MSMT LEADCHNL RV PACING THRESHOLD AMPLITUDE: 0.625 V
MDC IDC MSMT LEADCHNL RV PACING THRESHOLD PULSEWIDTH: 0.4 ms
MDC IDC MSMT LEADCHNL RV SENSING INTR AMPL: 11.875 mV
MDC IDC SET LEADCHNL RV PACING PULSEWIDTH: 0.4 ms
MDC IDC SET ZONE DETECTION INTERVAL: 360 ms
MDC IDC STAT BRADY AP VS PERCENT: 96.05 %
MDC IDC STAT BRADY AS VS PERCENT: 3.61 %
Zone Setting Detection Interval: 270 ms
Zone Setting Detection Interval: 330 ms
Zone Setting Detection Interval: 400 ms

## 2014-04-19 MED ORDER — POLYETHYLENE GLYCOL 3350 17 GM/SCOOP PO POWD: ORAL | Status: DC

## 2014-04-23 ENCOUNTER — Ambulatory Visit (INDEPENDENT_AMBULATORY_CARE_PROVIDER_SITE_OTHER): Payer: Medicare Other | Admitting: Pharmacist Clinician (PhC)/ Clinical Pharmacy Specialist

## 2014-04-23 DIAGNOSIS — Z7901 Long term (current) use of anticoagulants: Secondary | ICD-10-CM | POA: Diagnosis not present

## 2014-04-23 DIAGNOSIS — I4891 Unspecified atrial fibrillation: Secondary | ICD-10-CM | POA: Diagnosis not present

## 2014-04-23 LAB — POCT INR: INR: 2.2

## 2014-04-27 ENCOUNTER — Encounter: Payer: Self-pay | Admitting: Cardiology

## 2014-05-02 ENCOUNTER — Ambulatory Visit: Payer: Medicare Other | Admitting: Family Medicine

## 2014-05-02 ENCOUNTER — Telehealth: Payer: Self-pay | Admitting: Critical Care Medicine

## 2014-05-02 DIAGNOSIS — G4730 Sleep apnea, unspecified: Secondary | ICD-10-CM

## 2014-05-02 NOTE — Telephone Encounter (Signed)
Tell pt download on cpap is good. Continue to use the machine as able

## 2014-05-02 NOTE — Telephone Encounter (Signed)
Called, spoke with pt.  Discussed results and recs per Dr. Delford Field.  She verbalized understanding and voiced no further questions or concerns at this time.

## 2014-05-03 ENCOUNTER — Encounter: Payer: Self-pay | Admitting: Cardiovascular Disease

## 2014-05-07 ENCOUNTER — Ambulatory Visit (INDEPENDENT_AMBULATORY_CARE_PROVIDER_SITE_OTHER): Payer: Medicare Other | Admitting: Critical Care Medicine

## 2014-05-07 ENCOUNTER — Encounter: Payer: Self-pay | Admitting: Critical Care Medicine

## 2014-05-07 VITALS — BP 118/82 | HR 87 | Temp 98.2°F | Ht 67.0 in | Wt 195.0 lb

## 2014-05-07 DIAGNOSIS — J4540 Moderate persistent asthma, uncomplicated: Secondary | ICD-10-CM

## 2014-05-07 DIAGNOSIS — G4730 Sleep apnea, unspecified: Secondary | ICD-10-CM

## 2014-05-07 MED ORDER — BUDESONIDE 180 MCG/ACT IN AEPB: 2.0000 | INHALATION_SPRAY | Freq: Two times a day (BID) | RESPIRATORY_TRACT | Status: DC

## 2014-05-07 MED ORDER — FLUTICASONE PROPIONATE HFA 44 MCG/ACT IN AERO: 2.0000 | INHALATION_SPRAY | Freq: Two times a day (BID) | RESPIRATORY_TRACT | Status: DC

## 2014-05-07 MED ORDER — LEVALBUTEROL TARTRATE 45 MCG/ACT IN AERO: INHALATION_SPRAY | RESPIRATORY_TRACT | Status: DC

## 2014-05-07 NOTE — Assessment & Plan Note (Signed)
Moderate persistent asthma stable at present.   Insurance requests change from Qvar to pulmicort  Plan Cont prn saba Cont ics with pulmicort two puff bid 180 Rov 4 months

## 2014-05-07 NOTE — Patient Instructions (Signed)
When qvar finishes, start pulmicort two puff twice daily Xopenex was refilled No other changes Stay on cpap every night Return 4 months

## 2014-05-07 NOTE — Assessment & Plan Note (Signed)
Severe sleep apnea improved with cpap   Last download improved with resolution of Cpap , excellent compliance Plan  cont cpap

## 2014-05-07 NOTE — Progress Notes (Signed)
Subjective:    Patient ID: [name], female    DOB: 23-Mar-1951, 64 y.o.   MRN: [id number]  HPI64 y.o.  AAF with known hx of Asthma , VCD, CHF and Atrial fib on chronic coumadin    05/07/2014 Chief Complaint  Patient presents with  . Follow-up    Breathing doing well overall - No SOB, wheezing, chest tightness/CP, or cough at this time.  Wearing cpap everynight for approx 6-8 hours per night.  No problems with mask or pressure.  Overall doing well, less dyspnea, less cough, uses cpap nightly.   Pt denies any significant sore throat, nasal congestion or excess secretions, fever, chills, sweats, unintended weight loss, pleurtic or exertional chest pain, orthopnea PND, or leg swelling Pt denies any increase in rescue therapy over baseline, denies waking up needing it or having any early am or nocturnal exacerbations of coughing/wheezing/or dyspnea. Pt also denies any obvious fluctuation in symptoms with  weather or environmental change or other alleviating or aggravating factors     PUL ASTHMA HISTORY 05/07/2014 01/04/2014 08/31/2013 06/26/2011  Symptoms 0-2 days/week 0-2 days/week 0-2 days/week Daily  Nighttime awakenings 0-2/month 0-2/month 0-2/month 0-2/month  Interference with activity No limitations No limitations Minor limitations Some limitations  SABA use 0-2 days/wk 0-2 days/wk 0-2 days/wk Several times/day  Exacerbations requiring oral steroids 0-1 / year 0-1 / year 0-1 / year 0-1 / year      Review of Systems Constitutional:   No  weight loss, night sweats,  Fevers, chills, fatigue, lassitude. HEENT:   No headaches,  Difficulty swallowing,  Tooth/dental problems,  Sore throat,                No sneezing, itching, ear ache, nasal congestion, No post nasal drip,   CV:  No chest pain,  Orthopnea, PND, swelling in lower extremities, anasarca, dizziness, palpitations  GI  No heartburn, indigestion, abdominal pain, nausea, vomiting, diarrhea, change in bowel habits, loss of  appetite  Resp:   No coughing up of blood. Marland Kitchen  No chest wall deformity  Skin: no rash or lesions.  GU: no dysuria, change in color of urine, no urgency or frequency.  No flank pain.  MS:  No joint pain or swelling.  No decreased range of motion.  No back pain.  Psych:  No change in mood or affect. No depression or anxiety.  No memory loss.     Objective:   Physical Exam Filed Vitals:   05/07/14 0838  BP: 118/82  Pulse: 87  Temp: 98.2 F (36.8 C)  TempSrc: Oral  Height:  (1.702 m)  Weight: 195 lb (88.451 kg)  SpO2: 99%    Gen: Pleasant, well-nourished, in no distress,  normal affect  ENT: No lesions,  mouth clear,  oropharynx clear, no postnasal drip  Neck: No JVD, no TMG, no carotid bruits  Lungs: good airflow, speaking in full sentences, no wheeze  Cardiovascular: RRR, heart sounds normal, no murmur or gallops, no peripheral edema  Abdomen: soft and NT, no HSM,  BS normal  Musculoskeletal: No deformities, no cyanosis or clubbing  Neuro: alert, non focal  Skin: Warm, no lesions or rashes  No results found.      Assessment & Plan:   Asthma, moderate persistent Moderate persistent asthma stable at present.   Insurance requests change from Qvar to [employer]  Plan Cont prn saba Cont ics with pulmicort two puff bid 180 Rov 4 months   Sleep apnea Severe sleep apnea improved with  cpap   Last download improved with resolution of Cpap , excellent compliance Plan  cont cpap      Updated Medication List Outpatient Encounter Prescriptions as of 05/07/2014  Medication Sig  . B-D ULTRAFINE III SHORT PEN 31G X 8 MM MISC use as directed  . buPROPion (WELLBUTRIN XL) 150 MG 24 hr tablet take 1 tablet by mouth once daily  . chlorpheniramine (CHLOR-TRIMETON) 4 MG tablet 8mg  at night  . digoxin (LANOXIN) 0.125 MG tablet take 1/2 tablet by mouth once daily  . dofetilide (TIKOSYN) 250 MCG capsule Take 250 mcg by mouth 2 (two) times daily.  . fluticasone  (FLONASE) 50 MCG/ACT nasal spray instill 1 spray into each nostril twice a day  . glipiZIDE (GLUCOTROL) 5 MG tablet Take 1 tablet by mouth twice a day before meals  . HYDROcodone-homatropine (HYDROMET) 5-1.5 MG/5ML syrup Take 5 mLs by mouth every 6 (six) hours as needed for cough.  . Insulin Glargine (LANTUS) 100 UNIT/ML Solostar Pen Inject 30 Units into the skin at bedtime.  . levalbuterol (XOPENEX HFA) 45 MCG/ACT inhaler inhale 1 to 2 puffs by mouth INTO THE LUNGS every 4 hours if needed for wheezing  . levalbuterol (XOPENEX) 0.63 MG/3ML nebulizer solution One vial in nebulizer four times daily as needed dx 493.00  . losartan (COZAAR) 50 MG tablet take 1 tablet by mouth once daily  . metoprolol (LOPRESSOR) 50 MG tablet take 1 tablet by mouth twice a day  . NEXIUM 40 MG capsule take 1 capsule by mouth once daily  . Omega-3 Fatty Acids (FISH OIL) 1200 MG CAPS Take 1 capsule by mouth daily.  . polyethylene glycol powder (GLYCOLAX/MIRALAX) powder take 17GM (DISSOLVED IN WATER) by mouth once daily  . RA LORATADINE 10 MG tablet take 1 tablet by mouth once daily  . rosuvastatin (CRESTOR) 10 MG tablet Take 1 tablet (10 mg total) by mouth daily.  Marland Kitchen spironolactone (ALDACTONE) 25 MG tablet take 1/2 tablet by mouth once daily  . torsemide (DEMADEX) 10 MG tablet take 2 tablets by mouth once daily or as directed  . warfarin (COUMADIN) 5 MG tablet Take 1.5 tablets by mouth daily or as directed by coumadin clinic  . [DISCONTINUED] beclomethasone (QVAR) 40 MCG/ACT inhaler Inhale 2 puffs into the lungs 2 (two) times daily.  . [DISCONTINUED] XOPENEX HFA 45 MCG/ACT inhaler inhale 1 to 2 puffs by mouth INTO THE LUNGS every 4 hours if needed for wheezing  . budesonide (PULMICORT FLEXHALER) 180 MCG/ACT inhaler Inhale 2 puffs into the lungs 2 (two) times daily.  . [DISCONTINUED] fluticasone (FLOVENT HFA) 44 MCG/ACT inhaler Inhale 2 puffs into the lungs 2 (two) times daily.

## 2014-05-09 ENCOUNTER — Ambulatory Visit
Admission: RE | Admit: 2014-05-09 | Discharge: 2014-05-09 | Disposition: A | Discharge status: Home / Self Care | Payer: Medicare Other | Source: Ambulatory Visit

## 2014-05-09 ENCOUNTER — Encounter: Payer: Self-pay | Admitting: Family Medicine

## 2014-05-09 ENCOUNTER — Ambulatory Visit (INDEPENDENT_AMBULATORY_CARE_PROVIDER_SITE_OTHER): Payer: Medicare Other | Admitting: Family Medicine

## 2014-05-09 VITALS — BP 115/87 | HR 96 | Temp 98.2°F | Ht 67.0 in | Wt 194.0 lb

## 2014-05-09 DIAGNOSIS — E1165 Type 2 diabetes mellitus with hyperglycemia: Secondary | ICD-10-CM | POA: Diagnosis not present

## 2014-05-09 DIAGNOSIS — Z1231 Encounter for screening mammogram for malignant neoplasm of breast: Secondary | ICD-10-CM | POA: Diagnosis not present

## 2014-05-09 DIAGNOSIS — E1140 Type 2 diabetes mellitus with diabetic neuropathy, unspecified: Secondary | ICD-10-CM | POA: Diagnosis not present

## 2014-05-09 DIAGNOSIS — IMO0002 Reserved for concepts with insufficient information to code with codable children: Secondary | ICD-10-CM

## 2014-05-09 LAB — POCT GLYCOSYLATED HEMOGLOBIN (HGB A1C): HEMOGLOBIN A1C: 8.9

## 2014-05-09 NOTE — Assessment & Plan Note (Addendum)
-   A1C 8.9 down from 9.1 at last visit. - Increase dose of Lantus to 36units with breakfast - Will contact Mrs. Toso in two weeks to check blood sugars and adjust dose of insulin - Follow up with podiatry next week - Next eye exam in due July 2016 - Follow up in 3 months.

## 2014-05-09 NOTE — Progress Notes (Signed)
Subjective:     Patient ID: [name], female   DOB: Apr 06, 1951, 64 y.o.   MRN: [id number]  HPI 64yo female presenting today for A1C and to follow up Diabetes management. - Currently takes 34Units of Lantus with breakfast and Glipizide 5mg . Increased dose of Lantus from 32 to 34 about one month ago. - Sugars have been ranging from 119-169. Average 120. - Denies any episodes of hypoglycemia - Some tingling in legs bilaterally but does not wish to do anything about it at this time. Has tried gabapentin in the past without relief. - Goes to podiatrist for diabetic shoe fitting next week - Going to see ophthalmologist in June - No further complaints today   Review of Systems  All other systems reviewed and are negative.      Objective:   Physical Exam  Constitutional: She appears well-developed and well-nourished. No distress.  Cardiovascular: Normal rate and regular rhythm.  Exam reveals no gallop and no friction rub.   No murmur heard. Pulmonary/Chest: Effort normal. No respiratory distress. She has no wheezes. She has no rales.  Neurological:  Decreased sensation of feet bilaterally  Skin:  Small foot ulcer noted on left heel, no signs of infection       Assessment:     Please refer to Problem List for Assessment.    Plan:     Please refer to Problem List for Plan.

## 2014-05-09 NOTE — Patient Instructions (Signed)
Thank you so much for coming to visit me today! Your A1C is 8.9, which is an improvement from your last A1C of 9.1! We will increase your dose of Lantus to 36 units with breakfast. Please let me know if you have any low blood sugars! I will call you in two weeks to check in with you and see how your sugars ar doing. Continue to work on your exercise and diet! I'm glad you are making time to walk!  Thanks again! Dr. Caroleen Hamman

## 2014-05-14 ENCOUNTER — Other Ambulatory Visit: Payer: Self-pay | Admitting: *Deleted

## 2014-05-14 ENCOUNTER — Ambulatory Visit: Payer: Medicare Other | Admitting: *Deleted

## 2014-05-14 DIAGNOSIS — E1151 Type 2 diabetes mellitus with diabetic peripheral angiopathy without gangrene: Secondary | ICD-10-CM

## 2014-05-14 MED ORDER — SPIRONOLACTONE 25 MG PO TABS: 12.5000 mg | ORAL_TABLET | Freq: Every day | ORAL | Status: DC

## 2014-05-14 MED ORDER — GLIPIZIDE 5 MG PO TABS: ORAL_TABLET | ORAL | Status: DC

## 2014-05-14 NOTE — Patient Instructions (Signed)
YOU WILL RECEIVE A POST CARD IN THE MAIL WHEN YOUR SHOES HAVE ARRIVED TELLING YOU TO SCHEDULE AN APPOINTMENT WITH DR Charlsie Merles

## 2014-05-14 NOTE — Progress Notes (Signed)
BEING MEASURED AND CHOOSING MY DIABETIC SHOES  APEX EMMY A720W BLACK 11.5 MED

## 2014-05-21 ENCOUNTER — Ambulatory Visit (INDEPENDENT_AMBULATORY_CARE_PROVIDER_SITE_OTHER): Payer: Medicare Other | Admitting: Pharmacist Clinician (PhC)/ Clinical Pharmacy Specialist

## 2014-05-21 DIAGNOSIS — Z7901 Long term (current) use of anticoagulants: Secondary | ICD-10-CM | POA: Diagnosis not present

## 2014-05-21 DIAGNOSIS — I4891 Unspecified atrial fibrillation: Secondary | ICD-10-CM | POA: Diagnosis not present

## 2014-05-21 LAB — POCT INR: INR: 2.1

## 2014-05-28 ENCOUNTER — Other Ambulatory Visit: Payer: Self-pay | Admitting: Cardiovascular Disease

## 2014-05-28 NOTE — Telephone Encounter (Signed)
Rx(s) sent to pharmacy electronically.  

## 2014-06-01 ENCOUNTER — Telehealth: Payer: Self-pay | Admitting: Family Medicine

## 2014-06-01 MED ORDER — INSULIN GLARGINE 100 UNIT/ML SOLOSTAR PEN: 38.0000 [IU] | PEN_INJECTOR | Freq: Every day | SUBCUTANEOUS | Status: DC

## 2014-06-01 NOTE — Telephone Encounter (Signed)
Contacted [name] concerning her blood sugars. States they are at average around 160. Some mornings it  May be down to 120-130. Has not had any episodes of Hypoglycemia. Will increase dose of Lantus from 36units to 38units. Instructed to call back if she develops any low blood sugars. No further complaints at this time.

## 2014-06-07 ENCOUNTER — Ambulatory Visit: Payer: Medicare Other | Admitting: *Deleted

## 2014-06-07 DIAGNOSIS — E1151 Type 2 diabetes mellitus with diabetic peripheral angiopathy without gangrene: Secondary | ICD-10-CM

## 2014-06-07 NOTE — Patient Instructions (Signed)

## 2014-06-07 NOTE — Progress Notes (Signed)
Patient ID: [name], female   DOB: Oct 25, 1950, 64 y.o.   MRN: [id number] PICKING UP DIABETIC SHOES  APEX EMMY BLACK [employer] [facility] 11 MEDIUM  3 PAIRS CUSTOM ACCOMMODATIVE INSERTS  SHOES ARE TOO WIDE IN THE HEEL SLIPPING EVEN WITH ADJUSTMENTS WILL RETURN AND REORDER [employer] [place] [employer] [facility] MEDIUM

## 2014-06-18 ENCOUNTER — Ambulatory Visit: Payer: Medicare Other

## 2014-06-18 ENCOUNTER — Other Ambulatory Visit: Payer: Self-pay | Admitting: *Deleted

## 2014-06-18 MED ORDER — BUPROPION HCL ER (XL) 150 MG PO TB24: ORAL_TABLET | ORAL | Status: DC

## 2014-06-19 DIAGNOSIS — M170 Bilateral primary osteoarthritis of knee: Secondary | ICD-10-CM | POA: Diagnosis not present

## 2014-06-21 ENCOUNTER — Telehealth: Payer: Self-pay | Admitting: Family Medicine

## 2014-06-21 NOTE — Telephone Encounter (Signed)
Pt called because on her last visit with the doctor they decided to go up on her Lantus. Now that she has she needs refill that will have the new dosage on it and she can get a refill . jw

## 2014-06-22 ENCOUNTER — Other Ambulatory Visit: Payer: Self-pay | Admitting: *Deleted

## 2014-06-22 MED ORDER — INSULIN GLARGINE 100 UNIT/ML SOLOSTAR PEN: 38.0000 [IU] | PEN_INJECTOR | Freq: Every day | SUBCUTANEOUS | Status: DC

## 2014-06-26 ENCOUNTER — Ambulatory Visit (INDEPENDENT_AMBULATORY_CARE_PROVIDER_SITE_OTHER): Payer: Medicare Other | Admitting: Cardiovascular Disease

## 2014-06-26 ENCOUNTER — Encounter: Payer: Self-pay | Admitting: Cardiovascular Disease

## 2014-06-26 ENCOUNTER — Ambulatory Visit (INDEPENDENT_AMBULATORY_CARE_PROVIDER_SITE_OTHER): Payer: Medicare Other | Admitting: *Deleted

## 2014-06-26 ENCOUNTER — Telehealth: Payer: Self-pay | Admitting: Critical Care Medicine

## 2014-06-26 VITALS — BP 118/80 | HR 81 | Resp 16 | Ht 67.0 in | Wt 198.4 lb

## 2014-06-26 DIAGNOSIS — I10 Essential (primary) hypertension: Secondary | ICD-10-CM

## 2014-06-26 DIAGNOSIS — Z7901 Long term (current) use of anticoagulants: Secondary | ICD-10-CM

## 2014-06-26 DIAGNOSIS — I4891 Unspecified atrial fibrillation: Secondary | ICD-10-CM

## 2014-06-26 DIAGNOSIS — I4819 Other persistent atrial fibrillation: Secondary | ICD-10-CM

## 2014-06-26 DIAGNOSIS — Z95810 Presence of automatic (implantable) cardiac defibrillator: Secondary | ICD-10-CM

## 2014-06-26 DIAGNOSIS — G4730 Sleep apnea, unspecified: Secondary | ICD-10-CM

## 2014-06-26 DIAGNOSIS — I481 Persistent atrial fibrillation: Secondary | ICD-10-CM | POA: Diagnosis not present

## 2014-06-26 DIAGNOSIS — I5022 Chronic systolic (congestive) heart failure: Secondary | ICD-10-CM | POA: Diagnosis not present

## 2014-06-26 DIAGNOSIS — E669 Obesity, unspecified: Secondary | ICD-10-CM

## 2014-06-26 DIAGNOSIS — Z01810 Encounter for preprocedural cardiovascular examination: Secondary | ICD-10-CM | POA: Diagnosis not present

## 2014-06-26 DIAGNOSIS — Z5181 Encounter for therapeutic drug level monitoring: Secondary | ICD-10-CM

## 2014-06-26 LAB — MDC_IDC_ENUM_SESS_TYPE_INCLINIC
Battery Voltage: 3.04 V
Brady Statistic AP VP Percent: 0.3 %
Brady Statistic AS VP Percent: 0.1 % — CL
Brady Statistic AS VS Percent: 3.9 %
Lead Channel Pacing Threshold Amplitude: 0.625 V
Lead Channel Pacing Threshold Pulse Width: 0.4 ms
Lead Channel Sensing Intrinsic Amplitude: 14.9 mV
Lead Channel Setting Pacing Amplitude: 2 V
Lead Channel Setting Pacing Pulse Width: 0.4 ms
MDC IDC MSMT LEADCHNL RA IMPEDANCE VALUE: 418 Ohm
MDC IDC MSMT LEADCHNL RA PACING THRESHOLD AMPLITUDE: 0.375 V
MDC IDC MSMT LEADCHNL RA PACING THRESHOLD PULSEWIDTH: 0.4 ms
MDC IDC MSMT LEADCHNL RA SENSING INTR AMPL: 1.8 mV
MDC IDC MSMT LEADCHNL RV IMPEDANCE VALUE: 456 Ohm
MDC IDC SET LEADCHNL RV PACING AMPLITUDE: 2.5 V
MDC IDC SET LEADCHNL RV SENSING SENSITIVITY: 0.3 mV
MDC IDC SET ZONE DETECTION INTERVAL: 270 ms
MDC IDC SET ZONE DETECTION INTERVAL: 330 ms
MDC IDC SET ZONE DETECTION INTERVAL: 400 ms
MDC IDC STAT BRADY AP VS PERCENT: 95.8 %
Zone Setting Detection Interval: 360 ms

## 2014-06-26 LAB — POCT INR: INR: 2.3

## 2014-06-26 NOTE — Patient Instructions (Signed)
Remote monitoring is used to monitor your ICD from home. This monitoring reduces the number of office visits required to check your device to one time per year. It allows [place] to keep an eye on the functioning of your device to ensure it is working properly. You are scheduled for a device check from home on 09/27/2014. You may send your transmission at any time that day. If you have a wireless device, the transmission will be sent automatically. After your physician reviews your transmission, you will receive a postcard with your next transmission date.  Your physician recommends that you schedule a follow-up appointment in: 6 months with Dr.Croitoru

## 2014-06-26 NOTE — Telephone Encounter (Signed)
Tell pt and Cpap [facility] supplier i reviewed download Good compliance and good sleep apnea control on current settings

## 2014-06-26 NOTE — Progress Notes (Signed)
Patient ID: [name], female   DOB: 1950/05/19, 64 y.o.   MRN: [id number]     Cardiology Office Note   Date:  06/26/2014   ID:  [name], DOB [birth year], MRN [id number]  PCP:  Araceli Bouche, DO  Cardiologist:   Thurmon Fair, MD   Chief Complaint  Patient presents with  . 6 month visit    no complaints.  Needs cardiac clearance for left knee surgery - Dr. Thurston Hole.      History of Present Illness: [name] is a 64 y.o. female who presents for surgical evaluation and operative surgical evaluation for left total knee replacement with Dr. Thurston Hole. Since her last appointment 6 months ago she has not had any new health challenges. She has not required hospitalization or major adjustment in diuretic regimen for congestive heart failure. She has become much more compliant with the CPAP therapy for obstructive sleep apnea and is beginning to feel improved energy and reduced somnolence during the day.  Her electrocardiogram shows atrial paced ventricular sensed rhythm(which her device shows is her rhythm 96% of the time) and the QTC is prolonged in the desirable range on Tikosyn) 472 ms. The overall burden of atrial fibrillation is only 1.1% and has been essentially unchanged over the last year. Activity level remains steady at roughly 3-1/2 hours a day. There is only 0.3% ventricular pacing. All other lead parameters are constant and in the normal range. The thoracic impedance does not show evidence of fluid accumulation.  From a functonal status point of view she maintains NYHA class II, with activity limitations being dictated by her orthopedic rather than cardiac problems. She very rarely has edema and takes an extra dose of loop diuretic no more than once a month. She is rarely troubled by palpitations.  She has a history of moderate nonischemic cardiomyopathy (no coronary disease by cardiac catheterization January 2012), recurrent paroxysmal atrial fibrillation status post 2  ablation procedures (Dr. Orson Aloe), with history of ventricular fibrillation arrest while on treatment with diltiazem and dofetilide and a prolonged QT interval, probably related to simultaneous quinolone therapy. She has a dual-chamber Medtronic Protecta defibrillator implanted in January 2012. She is obese and has obstructive sleep apnea with improved compliance with CPAP.    Past Medical History  Diagnosis Date  . Atrial fibrillation     ablation x 2 [facility], 01/2006, 2011.  on warfarin  . Hyperlipidemia   . Depression   . Anxiety   . Diabetes mellitus   . Anemia   . Arthritis   . GERD (gastroesophageal reflux disease)   . Sleep apnea   . Vocal cord disease   . CHF (congestive heart failure)     Echo 08/08/10 by [facility]. EF 35-45%. LV systolic function moderately reduced. Moderate global hypokinesis of LV.  RV systolic function moderately reduced. Mild MR. Trace AR.  Marland Kitchen Cardiac arrest jan 2012    in hospital for pneumonia when this occured- occured at the hospital  . GASTROESOPHAGEAL REFLUX, NO ESOPHAGITIS 06/10/2006    Qualifier: Diagnosis of  By: Abundio Miu    . Asthma     has had multiple hospitalizations for this  . Non-ischemic cardiomyopathy     echo 08/08/10 - EF 35-45% LV and RV systolic function mod reduced  . Limb pain 06/04/2008    LLE, Baker's cyst in popliteal fossa, no DVT    Past Surgical History  Procedure Laterality Date  . Cholecystectomy    . Knee  arthroscopy    . Cardiac defibrillator placement  05/02/10    Medtronic Protecta XT-DR for CHF-VT, last download 04/12/12  . Paf ablation      By Dr Sampson Goon. Now sees Dr Steele Berg at [facility]     Current Outpatient Prescriptions  Medication Sig Dispense Refill  . B-D ULTRAFINE III SHORT PEN 31G X 8 MM MISC use as directed 100 each 9  . budesonide (PULMICORT FLEXHALER) 180 MCG/ACT inhaler Inhale 2 puffs into the lungs 2 (two) times daily. 1 each 11  . buPROPion (WELLBUTRIN XL) 150 MG 24 hr tablet  take 1 tablet by mouth once daily 30 tablet 8  . chlorpheniramine (CHLOR-TRIMETON) 4 MG tablet 8mg  at night 14 tablet 0  . digoxin (LANOXIN) 0.125 MG tablet take 1/2 tablet by mouth once daily 15 tablet 9  . dofetilide (TIKOSYN) 250 MCG capsule Take 250 mcg by mouth 2 (two) times daily.    . fluticasone (FLONASE) 50 MCG/ACT nasal spray instill 1 spray into each nostril twice a day 16 g 5  . glipiZIDE (GLUCOTROL) 5 MG tablet Take 1 tablet by mouth twice a day before meals 60 tablet 3  . HYDROcodone-homatropine (HYDROMET) 5-1.5 MG/5ML syrup Take 5 mLs by mouth every 6 (six) hours as needed for cough. 240 mL 0  . Insulin Glargine (LANTUS) 100 UNIT/ML Solostar Pen Inject 38 Units into the skin at bedtime. 15 mL 2  . levalbuterol (XOPENEX HFA) 45 MCG/ACT inhaler inhale 1 to 2 puffs by mouth INTO THE LUNGS every 4 hours if needed for wheezing 15 g 4  . levalbuterol (XOPENEX) 0.63 MG/3ML nebulizer solution One vial in nebulizer four times daily as needed dx 493.00 360 mL 4  . losartan (COZAAR) 50 MG tablet take 1 tablet by mouth once daily 90 tablet 3  . metoprolol (LOPRESSOR) 50 MG tablet take 1 tablet by mouth twice a day 60 tablet 11  . NEXIUM 40 MG capsule take 1 capsule by mouth once daily 30 capsule 6  . Omega-3 Fatty Acids (FISH OIL) 1200 MG CAPS Take 1 capsule by mouth daily.    . polyethylene glycol powder (GLYCOLAX/MIRALAX) powder take 17GM (DISSOLVED IN WATER) by mouth once daily 255 g 5  . RA LORATADINE 10 MG tablet take 1 tablet by mouth once daily 30 tablet 5  . rosuvastatin (CRESTOR) 10 MG tablet Take 1 tablet (10 mg total) by mouth daily. 90 tablet 2  . spironolactone (ALDACTONE) 25 MG tablet Take 0.5 tablets (12.5 mg total) by mouth daily. 30 tablet 6  . torsemide (DEMADEX) 10 MG tablet take 2 tablets by mouth once daily or as directed 70 tablet 6  . warfarin (COUMADIN) 5 MG tablet Take 1.5 tablets by mouth daily or as directed by coumadin clinic 45 tablet 5   No current  facility-administered medications for this visit.    Allergies:   Avelox; Simvastatin; and Ace inhibitors    Social History:  The patient  reports that she has never smoked. She has never used smokeless tobacco. She reports that she does not drink alcohol or use illicit drugs.   Family History:  The patient's family history includes Diabetes in her father; Hypertension in her father.    ROS:  Please see the history of present illness. The patient specifically denies any chest pain at rest or with exertion, dyspnea at rest or with exertion, orthopnea, paroxysmal nocturnal dyspnea, syncope, palpitations, focal neurological deficits, intermittent claudication, lower extremity edema, unexplained weight gain, cough, hemoptysis or wheezing.  The patient also denies abdominal pain, nausea, vomiting, dysphagia, diarrhea, constipation, polyuria, polydipsia, dysuria, hematuria, frequency, urgency, abnormal bleeding or bruising, fever, chills, unexpected weight changes, mood swings, change in skin or hair texture, change in voice quality, auditory or visual problems, allergic reactions or rashes, new musculoskeletal complaints other than usual "aches and pains". Otherwise, review of systems are positive for none.   All other systems are reviewed and negative.    PHYSICAL EXAM: VS:  BP 118/80 mmHg  Pulse 81  Resp 16  Ht  (1.702 m)  Wt 198 lb 6.4 oz (89.994 kg)  BMI 31.07 kg/m2  LMP 07/26/2011 , BMI Body mass index is 31.07 kg/(m^2). General: Alert, oriented x3, no distress, mildly obese  Head: no evidence of trauma, PERRL, EOMI, no exophtalmos or lid lag, no myxedema, no xanthelasma; normal ears, nose and oropharynx  Neck: normal jugular venous pulsations and no hepatojugular reflux; brisk carotid pulses without delay and no carotid bruits  Chest: clear to auscultation, no signs of consolidation by percussion or palpation, normal fremitus, symmetrical and full respiratory excursions, healthy  appearance of the ICD site  Cardiovascular: normal position and quality of the apical impulse, regular rhythm, normal first and second heart sounds, no murmurs, rubs or gallops  Abdomen: no tenderness or distention, no masses by palpation, no abnormal pulsatility or arterial bruits, normal bowel sounds, no hepatosplenomegaly  Extremities: no clubbing, cyanosis or edema; 2+ radial, ulnar and brachial pulses bilaterally; 2+ right femoral, posterior tibial and dorsalis pedis pulses; 2+ left femoral, posterior tibial and dorsalis pedis pulses; no subclavian or femoral bruits  Neurological: grossly nonfocal    EKG:  EKG is ordered today. The ekg ordered today demonstrates Atrial paced ventricular sensed with one PVC, mildly prolonged QT interval at 472 ms, consistent with dofetilide effect, no ischemic changes.   Recent Labs: 02/28/2014: ALT 25; BUN 15; Creatinine 1.11*; Potassium 4.4; Sodium 135    Lipid Panel    Component Value Date/Time   CHOL 130 08/30/2013 0824   TRIG 96 08/30/2013 0824   HDL 43 08/30/2013 0824   CHOLHDL 3.0 08/30/2013 0824   VLDL 19 08/30/2013 0824   LDLCALC 68 08/30/2013 0824   LDLDIRECT 94 12/07/2007 2019      Wt Readings from Last 3 Encounters:  06/26/14 198 lb 6.4 oz (89.994 kg)  05/09/14 194 lb (87.998 kg)  05/07/14 195 lb (88.451 kg)      Other studies Reviewed: Additional studies/ records that were reviewed today include: Comprehensive ICD check. Review of the above records demonstrates: See history of present illness   ASSESSMENT AND PLAN:  1.  Preoperative cardiovascular risk assessment - low to moderately increased risk of complications at the time of surgery due to preexistent cardiac illness, but she is well compensated from all points of view. Once the dates of surgery is established we'll give her instructions regarding temporary interruption of warfarin. I do not think she'll require "bridging". Warfarin should be resumed as soon as  possible postoperatively for protection from DVT/PE as well as atrial fibrillation related cardioembolic events. 2. Atrial fibrillation, paroxysmal Status post initial 2012 radiofrequency ablation with recurrence of atrial fibrillation and repeat ablation in 2013. Now on Tikosyn, metoprolol and digoxin with occasional brief and asymptomatic recurrences of paroxysmal atrial fibrillation lasting up to 7-8 hours. She has minimal awareness of the arrhythmia. Overall arrhythmia burden is a low (1.1%). She should remain on lifelong anticoagulation. During episodes of atrial fibrillation rate control is acceptable. QTc interval is  472 ms, within the desirable range.  She was reminded again of the risk of drug interactions with dofetilide and warfarin. 3. Chronic systolic heart failure EF 35-40% She is well compensated heart failure. Her optivol tracks in the normal range. NYHA functional class II. She appears to be clinically euvolemic. She is on chronic loop diuretic therapy, as well as appropriate angiotensin receptor blocker and beta blocker therapy and aldactone.  4. APNEA, SLEEP  Compliance with CPAP is much improved 5. HYPERTENSION, BENIGN SYSTEMIC  Good control  6. ICD (St. Jude Protecta dual-chamber),secondary prevention (VF arrest) January 2012  Monitored remotely via the [facility] system. Normal device function. No discharges. Records recurrent episodes of asymptomatic paroxysmal atrial fibrillation. Atrial pacing roughly 96%, very rare ventricular pacing.    Current medicines are reviewed at length with the patient today.  The patient does not have concerns regarding medicines.  The following changes have been made:  no change  Labs/ tests ordered today include:  Orders Placed This Encounter  Procedures  . EKG 12-Lead    Patient Instructions  Remote monitoring is used to monitor your ICD from home. This monitoring reduces the number of office visits required to check your device to  one time per year. It allows [place] to keep an eye on the functioning of your device to ensure it is working properly. You are scheduled for a device check from home on 09/27/2014. You may send your transmission at any time that day. If you have a wireless device, the transmission will be sent automatically. After your physician reviews your transmission, you will receive a postcard with your next transmission date.  Your physician recommends that you schedule a follow-up appointment in: 6 months with Dr.Doniven Vanpatten      Joie Bimler, MD  06/26/2014 8:44 AM    [facility] [address] [place], [place], [place]  [address] Phone: [phone]; Fax: [phone]

## 2014-06-27 NOTE — Telephone Encounter (Signed)
[phone], pt cb

## 2014-06-27 NOTE — Telephone Encounter (Signed)
Need refill on [facility]us [facility] [address]

## 2014-06-27 NOTE — Telephone Encounter (Signed)
lmomtcb for pt on cell # ATC pt on home #, received msg "all circuits are busy now.  Please try your call again later."

## 2014-06-27 NOTE — Telephone Encounter (Signed)
LM on pt home # Spoke with patient, Results have been explained to patient, pt expressed understanding. Nothing further needed.

## 2014-06-27 NOTE — Telephone Encounter (Signed)
Lantus was just refilled on 3/11.

## 2014-06-28 ENCOUNTER — Telehealth: Payer: Self-pay | Admitting: *Deleted

## 2014-06-28 DIAGNOSIS — M170 Bilateral primary osteoarthritis of knee: Secondary | ICD-10-CM | POA: Diagnosis not present

## 2014-06-28 NOTE — Telephone Encounter (Signed)
Insurance will not cover Atorvastatin.  So far no PA requested for Crestor.

## 2014-06-29 ENCOUNTER — Ambulatory Visit (INDEPENDENT_AMBULATORY_CARE_PROVIDER_SITE_OTHER): Payer: Medicare Other | Admitting: Podiatrist

## 2014-06-29 ENCOUNTER — Telehealth: Payer: Self-pay | Admitting: *Deleted

## 2014-06-29 ENCOUNTER — Encounter: Payer: Self-pay | Admitting: Podiatrist

## 2014-06-29 DIAGNOSIS — E1140 Type 2 diabetes mellitus with diabetic neuropathy, unspecified: Secondary | ICD-10-CM | POA: Diagnosis not present

## 2014-06-29 DIAGNOSIS — M79609 Pain in unspecified limb: Secondary | ICD-10-CM

## 2014-06-29 DIAGNOSIS — M216X9 Other acquired deformities of unspecified foot: Secondary | ICD-10-CM | POA: Diagnosis not present

## 2014-06-29 DIAGNOSIS — Q828 Other specified congenital malformations of skin: Secondary | ICD-10-CM | POA: Diagnosis not present

## 2014-06-29 DIAGNOSIS — L84 Corns and callosities: Secondary | ICD-10-CM | POA: Diagnosis not present

## 2014-06-29 DIAGNOSIS — B351 Tinea unguium: Secondary | ICD-10-CM

## 2014-06-29 NOTE — Telephone Encounter (Signed)
Letter of surgical clearance faxed to Dr. Thurston Hole 06/28/14.

## 2014-06-29 NOTE — Patient Instructions (Addendum)
Diabetes and Foot Care Diabetes may cause you to have problems because of poor blood supply (circulation) to your feet and legs. This may cause the skin on your feet to become thinner, break easier, and heal more slowly. Your skin may become dry, and the skin may peel and crack. You may also have nerve damage in your legs and feet causing decreased feeling in them. You may not notice minor injuries to your feet that could lead to infections or more serious problems. Taking care of your feet is one of the most important things you can do for yourself.  HOME CARE INSTRUCTIONS  Wear shoes at all times, even in the house. Do not go barefoot. Bare feet are easily injured.  Check your feet daily for blisters, cuts, and redness. If you cannot see the bottom of your feet, use a mirror or ask someone for help.  Wash your feet with warm water (do not use hot water) and mild soap. Then pat your feet and the areas between your toes until they are completely dry. Do not soak your feet as this can dry your skin.  Apply a moisturizing lotion or petroleum jelly (that does not contain alcohol and is unscented) to the skin on your feet and to dry, brittle toenails. Do not apply lotion between your toes.  Trim your toenails straight across. Do not dig under them or around the cuticle. File the edges of your nails with an emery board or nail file.  Do not cut corns or calluses or try to remove them with medicine.  Wear clean socks or stockings every day. Make sure they are not too tight. Do not wear knee-high stockings since they may decrease blood flow to your legs.  Wear shoes that fit properly and have enough cushioning. To break in new shoes, wear them for just a few hours a day. This prevents you from injuring your feet. Always look in your shoes before you put them on to be sure there are no objects inside.  Do not cross your legs. This may decrease the blood flow to your feet.  If you find a minor scrape,  cut, or break in the skin on your feet, keep it and the skin around it clean and dry. These areas may be cleansed with mild soap and water. Do not cleanse the area with peroxide, alcohol, or iodine.  When you remove an adhesive bandage, be sure not to damage the skin around it.  If you have a wound, look at it several times a day to make sure it is healing.  Do not use heating pads or hot water bottles. They may burn your skin. If you have lost feeling in your feet or legs, you may not know it is happening until it is too late.  Make sure your health care provider performs a complete foot exam at least annually or more often if you have foot problems. Report any cuts, sores, or bruises to your health care provider immediately. SEEK MEDICAL CARE IF:   You have an injury that is not healing.  You have cuts or breaks in the skin.  You have an ingrown nail.  You notice redness on your legs or feet.  You feel burning or tingling in your legs or feet.  You have pain or cramps in your legs and feet.  Your legs or feet are numb.  Your feet always feel cold. SEEK IMMEDIATE MEDICAL CARE IF:   There is increasing redness,   swelling, or pain in or around a wound.  There is a red line that goes up your leg.  Pus is coming from a wound.  You develop a fever or as directed by your health care provider.  You notice a bad smell coming from an ulcer or wound. Document Released: 03/27/2000 Document Revised: 11/30/2012 Document Reviewed: 09/06/2012 ExitCare Patient Information 2015 [employer]. This information is not intended to replace advice given to you by your health care provider. Make sure you discuss any questions you have with your health care provider.   Diabetic Shoe and Insoles Instructions   Congratulations on receiving your new shoes and insoles. In accordance with Medicare regulations, they have been selected from our own inventory or have been special ordered to provide you  with the optimum comfort and protection. In order to receive the greatest benefit from this footwear, please follow these suggested guidelines.   Initial use of your diabetic shoes It may take a couple of days for you to feel fully comfortable wearing your new diabetic shoes and insoles. Some people are immediately comfortable wearing them, while others need more time to adjust. Generally, the body does not adapt to change rapidly and you may experience mild aches or fatigue when you first begin to wear your shoes.  Wear these shoes as long as they are comfortable. Try to only wear them indoors until you feel that they are comfortable for outdoor use.  If they bother your feet, TAKE THEM OFF and try them again a few hours later or the next day. Check for any soreness or swelling and notify your doctor immediately if you notice any of these signs. Otherwise, increase wear time as they become more comfortable. If you feel like your shoes are bothersome still after trying these tips, please call our office. Keep in mind, that once the outer sole of the shoe becomes dirty, we can no longer send them back.  The insoles should be changed every 4 months and replaced with a new pair.   Maintenance of your new shoes Your new diabetic shoes can be washed with mild soap and water and air dried. Try to keep away from high heat sources (heaters, dryers, fireplaces, etc.) as this may damage or distort the plastazote lining and insert in the shoe. Do NOT machine wash or use harsh chemicals such as bleach on your shoes. These shoes, if properly taken care of, should last one year.   Continuing Care Diabetic patients have fewer foot complications if they have been properly fitted in the correct type of footwear and accommodating insoles. The desired outcome is to fit you with the most appropriate, best fitting shoe possible in order to reduce the risk of and prevent foot complications, which could ultimately lead to  ulceration, infection and amputation. Medicare will pay for one pair of extra depth shoes and 3 pairs of insoles per calendar year.   REMEMBER TO SEE YOUR PODIATRIST FOR REGULAR FOOT EXAMS AT LEAST ONCE PER YEAR.    

## 2014-06-29 NOTE — Progress Notes (Signed)
HPI: Patient presents today for follow up of diabetic foot and nail care. Past medical history, meds, and allergies reviewed. Patient states blood sugar is under good  control.   Objective:   Objective:  Patients chart is reviewed.  Vascular status reveals pedal pulses noted at  2 out of 4 dp and pt bilateral .  Neurological sensation is Decreased to [facility] monofilament bilateral at 3/5 sites bilateral.  Dermatological exam reveals presence of pre ulcerative/ hyperkeratotic lesions bilateral feet.   Toenails are elongated, incurvated, discolored, dystrophic with ingrown deformity present.    Assessment: Diabetes with Neuropathy, symptomatic mycotic toenails, pre-ulcerative lesions bilateral  Plan: Discussed treatment options and alternatives. Debrided lesions and nails without complication.   Return appointment recommended at routine intervals of 3 months.

## 2014-07-02 ENCOUNTER — Telehealth: Payer: Self-pay | Admitting: Family Medicine

## 2014-07-02 MED ORDER — INSULIN GLARGINE 100 UNIT/ML SOLOSTAR PEN: 38.0000 [IU] | PEN_INJECTOR | Freq: Every day | SUBCUTANEOUS | Status: DC

## 2014-07-02 NOTE — Telephone Encounter (Signed)
Spoke with patient and informed her of below message 

## 2014-07-02 NOTE — Telephone Encounter (Signed)
Pt called because she said that her insulin is still not at the pharmacy and she really needs this. jw

## 2014-07-02 NOTE — Telephone Encounter (Signed)
Prescription for Lantus 38units sent to pharmacy. [facility] contacted and verified that prescription was received. Prescription should be waiting for [name] at [facility].

## 2014-07-04 ENCOUNTER — Telehealth: Payer: Self-pay | Admitting: Critical Care Medicine

## 2014-07-04 NOTE — Telephone Encounter (Signed)
Received surgery clearance fax from [facility] for left total knee replacement. Pt cleared for surgery from a medical standpoint per PW. Signed form faxed back to [facility] at [phone] and placed in scan folder. Pt aware.

## 2014-07-05 ENCOUNTER — Other Ambulatory Visit: Payer: Self-pay | Admitting: *Deleted

## 2014-07-05 DIAGNOSIS — M170 Bilateral primary osteoarthritis of knee: Secondary | ICD-10-CM | POA: Diagnosis not present

## 2014-07-05 MED ORDER — INSULIN GLARGINE 100 UNIT/ML SOLOSTAR PEN: 38.0000 [IU] | PEN_INJECTOR | Freq: Every day | SUBCUTANEOUS | Status: DC

## 2014-07-11 ENCOUNTER — Telehealth: Payer: Self-pay | Admitting: Cardiovascular Disease

## 2014-07-11 ENCOUNTER — Encounter: Payer: Self-pay | Admitting: Cardiovascular Disease

## 2014-07-11 DIAGNOSIS — Z79899 Other long term (current) drug therapy: Secondary | ICD-10-CM

## 2014-07-11 NOTE — Telephone Encounter (Signed)
Please increase torsemide by another 10 mg a day until she loses the extra 5 lb. Check BMET next week. OK to intermittently increase torsemide like this whenever fluid gain occurs. CPAP will not cause this (in fact, over time it should help prevent fluid gain)

## 2014-07-11 NOTE — Telephone Encounter (Signed)
Patient notified of advice per Dr. Salena Saner and she voiced understanding. She will have lab work next week.

## 2014-07-11 NOTE — Telephone Encounter (Signed)
Pt called in stating that in the last month she has noticed that she has gained 5 lbs. She feels like it could be fluid because it is visible in her legs and chin area. She would like to know if her CPAP machine could be the cause of the weight gain. Please call  Thanks

## 2014-07-11 NOTE — Telephone Encounter (Signed)
Returned call to patient. She states she has had a 5 lbs weight gain over the last month (194 lbs and now 200 lbs). She feels bloated. She denies SOB. She c/o leg and chin edema and reports that the swelling in her legs has increased gradually over the last month. She reports that if she presses a finger into the swollen area of her legs that it will leave an indention for a brief period. She states there have been NO changes to her medications since her last OV.   Will defer to Dr. Salena Saner for advice

## 2014-07-17 ENCOUNTER — Other Ambulatory Visit: Payer: Self-pay | Admitting: Critical Care Medicine

## 2014-07-17 ENCOUNTER — Other Ambulatory Visit: Payer: Self-pay | Admitting: Pharmacist Clinician (PhC)/ Clinical Pharmacy Specialist

## 2014-07-18 DIAGNOSIS — Z79899 Other long term (current) drug therapy: Secondary | ICD-10-CM | POA: Diagnosis not present

## 2014-07-19 LAB — BASIC METABOLIC PANEL
BUN: 13 mg/dL (ref 6–23)
CHLORIDE: 100 meq/L (ref 96–112)
CO2: 31 mEq/L (ref 19–32)
Calcium: 8.6 mg/dL (ref 8.4–10.5)
Creat: 0.82 mg/dL (ref 0.50–1.10)
GLUCOSE: 169 mg/dL — AB (ref 70–99)
POTASSIUM: 4.3 meq/L (ref 3.5–5.3)
SODIUM: 137 meq/L (ref 135–145)

## 2014-07-27 ENCOUNTER — Ambulatory Visit (INDEPENDENT_AMBULATORY_CARE_PROVIDER_SITE_OTHER): Payer: Medicare Other | Admitting: Pharmacist Clinician (PhC)/ Clinical Pharmacy Specialist

## 2014-07-27 VITALS — Wt 197.2 lb

## 2014-07-27 DIAGNOSIS — I4819 Other persistent atrial fibrillation: Secondary | ICD-10-CM

## 2014-07-27 DIAGNOSIS — Z7901 Long term (current) use of anticoagulants: Secondary | ICD-10-CM

## 2014-07-27 DIAGNOSIS — I481 Persistent atrial fibrillation: Secondary | ICD-10-CM | POA: Diagnosis not present

## 2014-07-27 LAB — POCT INR: INR: 2.2

## 2014-07-30 ENCOUNTER — Other Ambulatory Visit: Payer: Self-pay | Admitting: *Deleted

## 2014-07-30 MED ORDER — POLYETHYLENE GLYCOL 3350 17 GM/SCOOP PO POWD: ORAL | Status: DC

## 2014-08-01 ENCOUNTER — Ambulatory Visit (INDEPENDENT_AMBULATORY_CARE_PROVIDER_SITE_OTHER): Payer: Medicare Other | Admitting: Critical Care Medicine

## 2014-08-01 ENCOUNTER — Encounter: Payer: Self-pay | Admitting: Critical Care Medicine

## 2014-08-01 ENCOUNTER — Telehealth: Payer: Self-pay | Admitting: Critical Care Medicine

## 2014-08-01 VITALS — BP 115/94 | HR 71 | Temp 98.1°F | Ht 67.0 in | Wt 198.6 lb

## 2014-08-01 DIAGNOSIS — J4540 Moderate persistent asthma, uncomplicated: Secondary | ICD-10-CM

## 2014-08-01 MED ORDER — BUDESONIDE 180 MCG/ACT IN AEPB
2.0000 | INHALATION_SPRAY | Freq: Two times a day (BID) | RESPIRATORY_TRACT | Status: DC
Start: 2014-08-01 — End: 2016-01-03

## 2014-08-01 NOTE — Progress Notes (Signed)
Subjective:    Patient ID: [name], female    DOB: 12/15/1950, 64 y.o.   MRN: [id number]  HPI64 y.o.  AAF with known hx of Asthma , VCD, CHF and Atrial fib on chronic coumadin    08/01/2014 Chief Complaint  Patient presents with  . Follow-up    Pt still using CPAP. Denies any SOB, Wheezing, cough, chest congestion/tightness.   No resp issues .  Pt has ACP and is in the chart. Pt denies any significant sore throat, nasal congestion or excess secretions, fever, chills, sweats, unintended weight loss, pleurtic or exertional chest pain, orthopnea PND, or leg swelling Pt denies any increase in rescue therapy over baseline, denies waking up needing it or having any early am or nocturnal exacerbations of coughing/wheezing/or dyspnea. Pt also denies any obvious fluctuation in symptoms with  weather or environmental change or other alleviating or aggravating factors  Pt do have L TKR in June 2016.  [place].   PUL ASTHMA HISTORY 05/07/2014 01/04/2014 08/31/2013 06/26/2011  Symptoms 0-2 days/week 0-2 days/week 0-2 days/week Daily  Nighttime awakenings 0-2/month 0-2/month 0-2/month 0-2/month  Interference with activity No limitations No limitations Minor limitations Some limitations  SABA use 0-2 days/wk 0-2 days/wk 0-2 days/wk Several times/day  Exacerbations requiring oral steroids 0-1 / year 0-1 / year 0-1 / year 0-1 / year      Review of Systems Constitutional:   No  weight loss, night sweats,  Fevers, chills, fatigue, lassitude. HEENT:   No headaches,  Difficulty swallowing,  Tooth/dental problems,  Sore throat,                No sneezing, itching, ear ache, nasal congestion, No post nasal drip,   CV:  No chest pain,  Orthopnea, PND, swelling in lower extremities, anasarca, dizziness, palpitations  GI  No heartburn, indigestion, abdominal pain, nausea, vomiting, diarrhea, change in bowel habits, loss of appetite  Resp:   No coughing up of blood. Marland Kitchen  No chest wall deformity  Skin: no  rash or lesions.  GU: no dysuria, change in color of urine, no urgency or frequency.  No flank pain.  MS:  No joint pain or swelling.  No decreased range of motion.  No back pain.  Psych:  No change in mood or affect. No depression or anxiety.  No memory loss.     Objective:   Physical Exam Filed Vitals:   08/01/14 0850  BP: 115/94  Pulse: 71  Temp: 98.1 F (36.7 C)  TempSrc: Oral  Height:  (1.702 m)  Weight: 198 lb 9.6 oz (90.084 kg)  SpO2: 99%    Gen: Pleasant, well-nourished, in no distress,  normal affect  ENT: No lesions,  mouth clear,  oropharynx clear, no postnasal drip  Neck: No JVD, no TMG, no carotid bruits  Lungs: good airflow, speaking in full sentences, no wheeze  Cardiovascular: RRR, heart sounds normal, no murmur or gallops, no peripheral edema  Abdomen: soft and NT, no HSM,  BS normal  Musculoskeletal: No deformities, no cyanosis or clubbing  Neuro: alert, non focal  Skin: Warm, no lesions or rashes  No results found.      Assessment & Plan:   Asthma, moderate persistent Stable mod persistent asthma  Plan No change in inhaled or maintenance medications.       Updated Medication List Outpatient Encounter Prescriptions as of 08/01/2014  Medication Sig  . B-D ULTRAFINE III SHORT PEN 31G X 8 MM MISC use as directed  .  budesonide (PULMICORT FLEXHALER) 180 MCG/ACT inhaler Inhale 2 puffs into the lungs 2 (two) times daily.  Marland Kitchen buPROPion (WELLBUTRIN XL) 150 MG 24 hr tablet take 1 tablet by mouth once daily  . chlorpheniramine (CHLOR-TRIMETON) 4 MG tablet 8mg  at night  . digoxin (LANOXIN) 0.125 MG tablet take 1/2 tablet by mouth once daily  . dofetilide (TIKOSYN) 250 MCG capsule Take 250 mcg by mouth 2 (two) times daily.  . fluticasone (FLONASE) 50 MCG/ACT nasal spray instill 1 spray into each nostril twice a day  . glipiZIDE (GLUCOTROL) 5 MG tablet Take 1 tablet by mouth twice a day before meals  . HYDROcodone-homatropine (HYDROMET) 5-1.5  MG/5ML syrup Take 5 mLs by mouth every 6 (six) hours as needed for cough.  . Insulin Glargine (LANTUS) 100 UNIT/ML Solostar Pen Inject 38 Units into the skin at bedtime.  . levalbuterol (XOPENEX HFA) 45 MCG/ACT inhaler inhale 1 to 2 puffs by mouth INTO THE LUNGS every 4 hours if needed for wheezing  . levalbuterol (XOPENEX) 0.63 MG/3ML nebulizer solution One vial in nebulizer four times daily as needed dx 493.00  . losartan (COZAAR) 50 MG tablet take 1 tablet by mouth once daily  . metoprolol (LOPRESSOR) 50 MG tablet take 1 tablet by mouth twice a day  . NEXIUM 40 MG capsule take 1 capsule by mouth once daily  . Omega-3 Fatty Acids (FISH OIL) 1200 MG CAPS Take 1 capsule by mouth daily.  . polyethylene glycol powder (GLYCOLAX/MIRALAX) powder take 17GM (DISSOLVED IN WATER) by mouth once daily  . RA LORATADINE 10 MG tablet take 1 tablet by mouth once daily  . rosuvastatin (CRESTOR) 10 MG tablet Take 1 tablet (10 mg total) by mouth daily.  Marland Kitchen spironolactone (ALDACTONE) 25 MG tablet Take 0.5 tablets (12.5 mg total) by mouth daily.  Marland Kitchen torsemide (DEMADEX) 10 MG tablet take 2 tablets by mouth once daily or as directed  . warfarin (COUMADIN) 5 MG tablet take 1 and 1/2 tablets by mouth once daily  . [DISCONTINUED] budesonide (PULMICORT FLEXHALER) 180 MCG/ACT inhaler Inhale 2 puffs into the lungs 2 (two) times daily.

## 2014-08-01 NOTE — Telephone Encounter (Signed)
Spoke with pt.  She left HPOA form for office to scan into her chart.  This has been done.  Pt states she has a copy at home and doesn't need copy left for office.  This has been shredded -- pt aware and voiced no further questions or concerns at this time.

## 2014-08-01 NOTE — Assessment & Plan Note (Signed)
Stable mod persistent asthma  Plan No change in inhaled or maintenance medications.

## 2014-08-01 NOTE — Patient Instructions (Signed)
No change in medications. Return in         4 months 

## 2014-08-07 ENCOUNTER — Other Ambulatory Visit: Payer: Self-pay | Admitting: Cardiovascular Disease

## 2014-08-07 NOTE — Telephone Encounter (Signed)
Rx refill sent to patient pharmacy   

## 2014-08-08 ENCOUNTER — Encounter: Payer: Self-pay | Admitting: Family Medicine

## 2014-08-08 ENCOUNTER — Ambulatory Visit (INDEPENDENT_AMBULATORY_CARE_PROVIDER_SITE_OTHER): Payer: Medicare Other | Admitting: Family Medicine

## 2014-08-08 VITALS — BP 119/75 | HR 83 | Temp 98.4°F | Ht 67.0 in | Wt 198.0 lb

## 2014-08-08 DIAGNOSIS — E1165 Type 2 diabetes mellitus with hyperglycemia: Secondary | ICD-10-CM

## 2014-08-08 DIAGNOSIS — E1140 Type 2 diabetes mellitus with diabetic neuropathy, unspecified: Secondary | ICD-10-CM | POA: Diagnosis present

## 2014-08-08 DIAGNOSIS — IMO0002 Reserved for concepts with insufficient information to code with codable children: Secondary | ICD-10-CM

## 2014-08-08 LAB — POCT GLYCOSYLATED HEMOGLOBIN (HGB A1C): Hemoglobin A1C: 8.9

## 2014-08-08 MED ORDER — INSULIN ASPART 100 UNIT/ML ~~LOC~~ SOLN: SUBCUTANEOUS | Status: DC

## 2014-08-08 NOTE — Progress Notes (Signed)
Subjective:     Patient ID: [name], female   DOB: 1950/08/20, 64 y.o.   MRN: [id number]  HPI  [name] is a 64yo female here for diabetes follow up. - A1C last checked in January 2015, 8.9 - Currently takes Lantus 38units in the morning and Glipizide 5mg  - Documented sugars over the last week range from am fasting sugars of 103-145 and pm sugars (some pre-prandial and some immediately post-prandial) of 135-178 - States diet is going well. Eats oatmeal in morning. Tries to eat a lot of vegetables and fruits for lunch and supper - Exercise limited due to pain in leg. Going for surgery in June. Can't swim because she's allergic to chlorine, resulting in severe swelling and rash (rough time with Baptism...) - Scheduled to see ophthalmologist in July  Review of Systems  Musculoskeletal: Positive for arthralgias and gait problem.       Objective:   Physical Exam  Constitutional: She is oriented to person, place, and time. She appears well-developed and well-nourished. No distress.  Cardiovascular: Normal rate and regular rhythm.  Exam reveals no gallop and no friction rub.   No murmur heard. Pulmonary/Chest: Effort normal. No respiratory distress. She has no wheezes. She has no rales.  Abdominal: Soft. She exhibits no distension. There is no tenderness.  Musculoskeletal: She exhibits no edema.  Neurological: She is alert and oriented to person, place, and time.  Foot exam with decreased sensation L>R  Psychiatric: She has a normal mood and affect. Her behavior is normal.      Assessment:     Please refer to Problem List for Assessment.     Plan:     Please refer to Problem List for Plan.

## 2014-08-08 NOTE — Patient Instructions (Signed)
Thank you so much for paying me a visit today!  Your A1C was 8.9 today, which is the same that it was in January. Please continue to check your blood sugars twice a day, with your second check two hours after your biggest meal of the day. I have discussed your diabetic regimen with the pharmacist and will discontinue you Glipizide. We believe your A1C is elevated right after your meals. We will add another form of insulin that is fast acting to take with your biggest meal of the day. Please inject 5 units of Novolog 5-10 minutes before the meal. I have included information about Novolog below. I will call you in 2-3 weeks to see how you are doing, but please give the office a call if you have any questions or concerns before this time. Please continue to work on your diet and exercise. Follow up in 3 months so we can check your A1C.  Thanks again for coming to see me! It is always a pleasure seeing you in the clinic! Dr. Caroleen Hamman  Insulin Aspart injection What is this medicine? INSULIN ASPART (IN su lin AS part) is a human-made form of insulin. This drug lowers the amount of sugar in your blood. It is a fast acting insulin that starts working faster than regular insulin. It will not work as long as regular insulin. This medicine may be used for other purposes; ask your health care provider or pharmacist if you have questions. COMMON BRAND NAME(S): NovoLog, NovoLog Flexpen, NovoLog PenFill What should I tell my health care provider before I take this medicine? They need to know if you have any of these conditions: -episodes of hypoglycemia -kidney disease -liver disease -an unusual or allergic reaction to insulin, metacresol, other medicines, foods, dyes, or preservatives -pregnant or trying to get pregnant -breast-feeding How should I use this medicine? This medicine is for injection under the skin. Use exactly as directed. It is important to follow the directions given to you by your health care  professional or doctor. You should start your meal within 5 to 10 minutes after injection. Have food ready before injection. Do not delay eating. You will be taught how to use this medicine and how to adjust doses for activities and illness. Do not use more insulin than prescribed. Do not use more or less often than prescribed. Always check the appearance of your insulin before using it. This medicine should be clear and colorless like water. Do not use if it is cloudy, thickened, colored, or has solid particles in it. It is important that you put your used needles and syringes in a special sharps container. Do not put them in a trash can. If you do not have a sharps container, call your pharmacist or healthcare provider to get one. Talk to your pediatrician regarding the use of this medicine in children. While this drug may be prescribed for children as young as 68 years of age for selected conditions, precautions do apply. Overdosage: If you think you have taken too much of this medicine contact a poison control center or emergency room at once. NOTE: This medicine is only for you. Do not share this medicine with others. What if I miss a dose? It is important not to miss a dose. Your health care professional or doctor should discuss a plan for missed doses with you. If you do miss a dose, follow their plan. Do not take double doses. What may interact with this medicine? -other medicines for diabetes  Many medications may cause an increase or decrease in blood sugar, these include: -alcohol containing beverages -aspirin and aspirin-like drugs -chloramphenicol -chromium -diuretics -female hormones, like estrogens or progestins and birth control pills -heart medicines -isoniazid -female hormones or anabolic steroids -medicines for weight loss -medicines for allergies, asthma, cold, or cough -medicines for mental problems -medicines called MAO Inhibitors like Nardil, Parnate, Marplan,  Eldepryl -niacin -NSAIDs, medicines for pain and inflammation, like ibuprofen or naproxen -pentamidine -phenytoin -probenecid -quinolone antibiotics like ciprofloxacin, levofloxacin, ofloxacin -some herbal dietary supplements -steroid medicines like prednisone or cortisone -thyroid medicine Some medications can hide the warning symptoms of low blood sugar. You may need to monitor your blood sugar more closely if you are taking one of these medications. These include: -beta-blockers such as atenolol, metoprolol, propranolol -clonidine -guanethidine -reserpine This list may not describe all possible interactions. Give your health care provider a list of all the medicines, herbs, non-prescription drugs, or dietary supplements you use. Also tell them if you smoke, drink alcohol, or use illegal drugs. Some items may interact with your medicine. What should I watch for while using this medicine? Visit your health care professional or doctor for regular checks on your progress. A test called the HbA1C (A1C) will be monitored. This is a simple blood test. It measures your blood sugar control over the last 2 to 3 months. You will receive this test every 3 to 6 months. Learn how to check your blood sugar. Learn the symptoms of low and high blood sugar and how to manage them. Always carry a quick-source of sugar with you in case you have symptoms of low blood sugar. Examples include hard sugar candy or glucose tablets. Make sure others know that you can choke if you eat or drink when you develop serious symptoms of low blood sugar, such as seizures or unconsciousness. They must get medical help at once. Tell your doctor or health care professional if you have high blood sugar. You might need to change the dose of your medicine. If you are sick or exercising more than usual, you might need to change the dose of your medicine. Do not skip meals. Ask your doctor or health care professional if you should  avoid alcohol. Many nonprescription cough and cold products contain sugar or alcohol. These can affect blood sugar. Make sure that you have the right kind of syringe for the type of insulin you use. Try not to change the brand and type of insulin or syringe unless your health care professional or doctor tells you to. Switching insulin brand or type can cause dangerously high or low blood sugar. Always keep an extra supply of insulin, syringes, and needles on hand. Use a syringe one time only. Throw away syringe and needle in a closed container to prevent accidental needle sticks. Insulin pens and cartridges should never be shared. Even if the needle is changed, sharing may result in passing of viruses like hepatitis or HIV. Wear a medical ID bracelet or chain, and carry a card that describes your disease and details of your medicine and dosage times. What side effects may I notice from receiving this medicine? Side effects that you should report to your health care professional or doctor as soon as possible: -allergic reactions like skin rash, itching or hives, swelling of the face, lips, or tongue -breathing problems -signs and symptoms of high blood sugar such as dizziness, dry mouth, dry skin, fruity breath, nausea, stomach pain, increased hunger or thirst, increased urination -signs  and symptoms of low blood sugar such as feeling anxious, confusion, dizziness, increased hunger, unusually weak or tired, sweating, shakiness, cold, irritable, headache, blurred vision, fast heartbeat, loss of consciousness Side effects that usually do not require medical attention (report to your health care professional or doctor if they continue or are bothersome): -increase or decrease in fatty tissue under the skin due to overuse of a particular injection site -itching, burning, swelling, or rash at site where injected This list may not describe all possible side effects. Call your doctor for medical advice about  side effects. You may report side effects to FDA at 1-800-FDA-1088. Where should I keep my medicine? Keep out of the reach of children. Store unopened insulin vials in a refrigerator between 2 and 8 degrees C (36 and 46 degrees F). Do not freeze or use if the insulin has been frozen. Opened vials (vials currently in use) may be stored in the refrigerator or at room temperature, at approximately 30 degrees C (86 degrees F) or cooler. Keeping your insulin at room temperature decreases the amount of pain during injection. Once opened, your insulin can be used for 28 days. After 28 days, the vial of insulin should be thrown away. Store unopened cartridges, FlexPens, or Novalog Innolet systems in a refrigerator between 2 and 8 degrees C (36 and 46 degrees F.) Do not freeze or use if the insulin has been frozen. Once opened, the Novalog Innolet system, FlexPen, and cartridges that are inserted into pens should be kept at room temperature, approximately 25 degrees C (77 degrees F) or cooler. Do not store in the refrigerator. Once opened, the insulin can be used for 28 days. After 28 days, the cartridge, Novalog Innolet system or FlexPen should be thrown away. Protect from light and excessive heat. Throw away any unused medicine after the expiration date or after the specified time for room temperature storage has passed. NOTE: This sheet is a summary. It may not cover all possible information. If you have questions about this medicine, talk to your doctor, pharmacist, or health care provider.  2015, [employer]vier/Gold Standard. (2013-06-23 10:23:01)

## 2014-08-08 NOTE — Assessment & Plan Note (Signed)
-   A1C unchanged at 8.9, does not correlate with documented sugars of low 100s with outliers to 160-170s.  - Recommend continuing to check fasting glucose in morning and moving post-prandial glucose check to 2hours after largest meal of the day. Suspect elevated A1C secondary to high post-prandial sugars - Continue Lantus 38units - Discontinue Glipizide, does not appear to be effective - Initiate Novolog 5units with largest meal of day - Will contact Mrs. Fait in 1-2 weeks to discuss how she is doing with new regimen - Follow up in 51months to recheck A1C

## 2014-08-09 ENCOUNTER — Telehealth: Payer: Self-pay | Admitting: Family Medicine

## 2014-08-09 MED ORDER — INSULIN ASPART 100 UNIT/ML FLEXPEN: PEN_INJECTOR | SUBCUTANEOUS | Status: DC

## 2014-08-09 NOTE — Telephone Encounter (Signed)
Is at pharmacy. Wants the pen insulin rather than the kind you have to measure out of the bottle

## 2014-08-09 NOTE — Telephone Encounter (Signed)
Prescription for Novolog pen sent to pharmacy.

## 2014-08-17 ENCOUNTER — Telehealth: Payer: Self-pay | Admitting: Family Medicine

## 2014-08-17 NOTE — Telephone Encounter (Signed)
Patient called, she is having knee replacement surgery in June and is needing medical clearance from her PCP so her orthopedic surgeon can perform the surgery. She see Dr. Thurston Hole at [facility]. She states she believes Dr. Caroleen Hamman will need to call Del[facility] Harness to give this clearance. Please advise.

## 2014-08-24 ENCOUNTER — Ambulatory Visit (INDEPENDENT_AMBULATORY_CARE_PROVIDER_SITE_OTHER): Payer: Medicare Other | Admitting: Pharmacist Clinician (PhC)/ Clinical Pharmacy Specialist

## 2014-08-24 DIAGNOSIS — Z7901 Long term (current) use of anticoagulants: Secondary | ICD-10-CM | POA: Diagnosis not present

## 2014-08-24 DIAGNOSIS — I4891 Unspecified atrial fibrillation: Secondary | ICD-10-CM | POA: Diagnosis not present

## 2014-08-24 LAB — POCT INR: INR: 2.3

## 2014-08-28 NOTE — Telephone Encounter (Signed)
Theodis Shove contacted. States Medical Clearance was already obtained in March and no further clearance is necessary.  Mrs. Bucks contacted. States that she has clearance from Pulmonology and Cardiology and just needs clearance from PCP. Will recontact Eulah Pont and Thurston Hole and give clearance.   Also discussed blood sugars. Has been checking blood sugars in the morning prior to breakfast and after largest meal of day. Has been taking Lantus 38units in am and Novolog 5 units with largest meal. Reports that highest blood sugar recorded is 163 in afternoon. Average morning blood sugar is 150s. Average evening blood sugars is 130s-150s. Lowest blood sugar recorded is 137; denies any episodes of hypoglycemia. Due to generally elevated blood sugars throughout day, will increase Lantus to 40units in the morning. Will call back in a few weeks to discuss blood sugars again.   Recontacted Eulah Pont and Thurston Hole and received voice mail of Surgical Case Nurse. Left voicemail to return call. Plan to give medical clearance since already cleared by Cardiology and Pulmonology. Lab work recently done in April 2016. ACS NSQIP with average risk from surgery except for above average risk for cardiac complications <1%, would infection 1%, and UTI 1%. Will verify cardiac clearance prior to giving primary care clearance.   Dr. Caroleen Hamman

## 2014-09-05 ENCOUNTER — Encounter: Payer: Self-pay | Admitting: Physician Assistant

## 2014-09-05 DIAGNOSIS — M1712 Unilateral primary osteoarthritis, left knee: Secondary | ICD-10-CM | POA: Diagnosis present

## 2014-09-05 DIAGNOSIS — M170 Bilateral primary osteoarthritis of knee: Secondary | ICD-10-CM | POA: Diagnosis not present

## 2014-09-05 NOTE — H&P (Signed)
TOTAL KNEE ADMISSION H&P  Patient is being admitted for left total knee arthroplasty.  Subjective:  Chief Complaint:left knee pain.  HPI: [name], 64 y.o. female, has a history of pain and functional disability in the left knee due to arthritis and has failed non-surgical conservative treatments for greater than 12 weeks to includeNSAID's and/or analgesics, corticosteriod injections, viscosupplementation injections, supervised PT with diminished ADL's post treatment, use of assistive devices, weight reduction as appropriate and activity modification.  Onset of symptoms was gradual, starting 10 years ago with gradually worsening course since that time. The patient noted no past surgery on the left knee(s).  Patient currently rates pain in the left knee(s) at 10 out of 10 with activity. Patient has night pain, worsening of pain with activity and weight bearing, pain that interferes with activities of daily living, crepitus and joint swelling.  Patient has evidence of subchondral sclerosis, periarticular osteophytes and joint space narrowing by imaging studies. There is no active infection.  Patient Active Problem List   Diagnosis Date Noted  . Primary localized osteoarthritis of left knee   . Preoperative cardiovascular examination 06/26/2014  . Heel pain 01/12/2014  . Obesity (BMI 30-39.9) 01/04/2014  . Skin lesion 12/10/2012  . ICD (St. Jude Protecta dual-chamber),secondary prevention (VF arrest) January 2012 11/19/2012  . Long term current use of anticoagulant therapy 06/30/2012  . CKD (chronic kidney disease) stage 2, GFR 60-89 ml/min 02/22/2012  . Asthma, moderate persistent 12/10/2011  . Health maintenance examination 11/02/2011  . Trochanteric bursitis of right hip 11/02/2011  . Diabetic neuropathy 09/18/2011  . Chronic systolic heart failure 05/09/2010  . OSTEOARTHRITIS, KNEE 08/28/2008  . DISEASE, VOCAL CORD NEC 10/13/2006  . Type 2 diabetes, uncontrolled, with neuropathy  06/10/2006  . HYPERCHOLESTEROLEMIA 06/10/2006  . HYPERTENSION, BENIGN SYSTEMIC 06/10/2006  . Atrial fibrillation 06/10/2006  . Sleep apnea 06/10/2006   Past Medical History  Diagnosis Date  . Atrial fibrillation     ablation x 2 [facility], 01/2006, 2011.  on warfarin  . Hyperlipidemia   . Depression   . Anxiety   . Diabetes mellitus   . Anemia   . Arthritis   . GERD (gastroesophageal reflux disease)   . Sleep apnea   . Vocal cord disease   . CHF (congestive heart failure)     Echo 08/08/10 by [facility]. EF 35-45%. LV systolic function moderately reduced. Moderate global hypokinesis of LV.  RV systolic function moderately reduced. Mild MR. Trace AR.  Marland Kitchen Cardiac arrest jan 2012    in hospital for pneumonia when this occured- occured at the hospital  . GASTROESOPHAGEAL REFLUX, NO ESOPHAGITIS 06/10/2006    Qualifier: Diagnosis of  By: Abundio Miu    . Asthma     has had multiple hospitalizations for this  . Non-ischemic cardiomyopathy     echo 08/08/10 - EF 35-45% LV and RV systolic function mod reduced  . Limb pain 06/04/2008    LLE, Baker's cyst in popliteal fossa, no DVT  . Primary localized osteoarthritis of left knee     Past Surgical History  Procedure Laterality Date  . Cholecystectomy    . Knee arthroscopy Right   . Cardiac defibrillator placement  05/02/10    Medtronic Protecta XT-DR for CHF-VT, last download 04/12/12  . Paf ablation      By Dr Sampson Goon. Now sees Dr Steele Berg at [facility]    .No current facility-administered medications for this encounter.  Current outpatient prescriptions:  .  B-D ULTRAFINE III  SHORT PEN 31G X 8 MM MISC, use as directed, Disp: 100 each, Rfl: 9 .  budesonide (PULMICORT FLEXHALER) 180 MCG/ACT inhaler, Inhale 2 puffs into the lungs 2 (two) times daily., Disp: 1 each, Rfl: 11 .  buPROPion (WELLBUTRIN XL) 150 MG 24 hr tablet, take 1 tablet by mouth once daily (Patient taking differently: Take 150 mg by mouth daily. take 1 tablet by  mouth once daily), Disp: 30 tablet, Rfl: 8 .  chlorpheniramine (CHLOR-TRIMETON) 4 MG tablet, 8mg  at night (Patient taking differently: Take 8 mg by mouth at bedtime. 8mg  at night), Disp: 14 tablet, Rfl: 0 .  DIGITEK 125 MCG tablet, take 1/2 tablets by mouth once daily (Patient taking differently: take 1/2 tablets by mouth once daily 0.0625 mg daily), Disp: 15 tablet, Rfl: 9 .  dofetilide (TIKOSYN) 250 MCG capsule, Take 250 mcg by mouth 2 (two) times daily., Disp: , Rfl:  .  fluticasone (FLONASE) 50 MCG/ACT nasal spray, instill 1 spray into each nostril twice a day (Patient taking differently: Place 1 spray into both nostrils daily. instill 1 spray into each nostril twice a day), Disp: 16 g, Rfl: 5 .  HYDROcodone-homatropine (HYDROMET) 5-1.5 MG/5ML syrup, Take 5 mLs by mouth every 6 (six) hours as needed for cough., Disp: 240 mL, Rfl: 0 .  insulin aspart (NOVOLOG FLEXPEN) 100 UNIT/ML FlexPen, Please inject 5 units with large meal of day (Patient taking differently: Inject 5 Units into the skin See admin instructions. Please inject 5 units with large meal of day), Disp: 15 mL, Rfl: 11 .  Insulin Glargine (LANTUS) 100 UNIT/ML Solostar Pen, Inject 38 Units into the skin at bedtime. (Patient taking differently: Inject 38 Units into the skin daily before breakfast. ), Disp: 15 mL, Rfl: 12 .  levalbuterol (XOPENEX HFA) 45 MCG/ACT inhaler, inhale 1 to 2 puffs by mouth INTO THE LUNGS every 4 hours if needed for wheezing (Patient taking differently: Inhale 1-2 puffs into the lungs every 4 (four) hours as needed for wheezing. inhale 1 to 2 puffs by mouth INTO THE LUNGS every 4 hours if needed for wheezing), Disp: 15 g, Rfl: 4 .  levalbuterol (XOPENEX) 0.63 MG/3ML nebulizer solution, One vial in nebulizer four times daily as needed dx 493.00 (Patient taking differently: Take 0.63 mg by nebulization every 6 (six) hours as needed for wheezing or shortness of breath. One vial in nebulizer four times daily as needed dx  493.00), Disp: 360 mL, Rfl: 4 .  losartan (COZAAR) 50 MG tablet, take 1 tablet by mouth once daily (Patient taking differently: Take 50 mg by mouth daily. take 1 tablet by mouth once daily), Disp: 90 tablet, Rfl: 3 .  metoprolol (LOPRESSOR) 50 MG tablet, take 1 tablet by mouth twice a day (Patient taking differently: Take 50 mg by mouth 2 (two) times daily. take 1 tablet by mouth twice a day), Disp: 60 tablet, Rfl: 11 .  NEXIUM 40 MG capsule, take 1 capsule by mouth once daily, Disp: 30 capsule, Rfl: 6 .  Omega-3 Fatty Acids (FISH OIL) 1200 MG CAPS, Take 1 capsule by mouth daily., Disp: , Rfl:  .  polyethylene glycol powder (GLYCOLAX/MIRALAX) powder, take 17GM (DISSOLVED IN WATER) by mouth once daily (Patient taking differently: Take 17 g by mouth daily. take 17GM (DISSOLVED IN WATER) by mouth once daily), Disp: 255 g, Rfl: 5 .  RA LORATADINE 10 MG tablet, take 1 tablet by mouth once daily, Disp: 30 tablet, Rfl: 5 .  rosuvastatin (CRESTOR) 10 MG tablet, Take  1 tablet (10 mg total) by mouth daily., Disp: 90 tablet, Rfl: 2 .  spironolactone (ALDACTONE) 25 MG tablet, Take 0.5 tablets (12.5 mg total) by mouth daily., Disp: 30 tablet, Rfl: 6 .  torsemide (DEMADEX) 10 MG tablet, take 2 tablets by mouth once daily or as directed, Disp: 70 tablet, Rfl: 6 .  warfarin (COUMADIN) 5 MG tablet, take 1 and 1/2 tablets by mouth once daily (Patient taking differently: Take 1 to 1 and 1/2 tablets by mouth once daily ( 5 mg Mon, Wed, Fri, and Sat;    7.5 mg Sun, Tues, Thurs)), Disp: 45 tablet, Rfl: 5    Allergies  Allergen Reactions  . Avelox [Moxifloxacin Hcl In Nacl]     Cardiac arrest per pt  . Simvastatin Other (See Comments)    myalgias  . Ace Inhibitors Cough    Dry cough   . Latex Rash    History  Substance Use Topics  . Smoking status: Never Smoker   . Smokeless tobacco: Never Used  . Alcohol Use: No    Family History  Problem Relation Age of Onset  . Diabetes Father   . Hypertension Father       Review of Systems  Constitutional: Negative.   HENT: Negative.   Eyes: Negative.   Respiratory: Negative.   Cardiovascular: Negative.   Gastrointestinal: Negative.   Genitourinary: Negative.   Musculoskeletal: Positive for joint pain.  Skin: Negative.   Neurological: Negative.   Endo/Heme/Allergies: Negative.   Psychiatric/Behavioral: Negative.     Objective:  Physical Exam  Constitutional: She is oriented to person, place, and time. She appears well-developed and well-nourished.  HENT:  Head: Normocephalic and atraumatic.  Eyes: Conjunctivae are normal. Pupils are equal, round, and reactive to light.  Neck: Neck supple.  Cardiovascular: Normal rate, regular rhythm and normal heart sounds.   Respiratory: Effort normal.  GI: Bowel sounds are normal.  Genitourinary:  Not pertinent to current symptomatology therefore not examined.  Musculoskeletal:  She is independently ambulatory with a moderately antalgic gait.  She has active range of motion 0-115 degrees bilaterally.  1+ crepitus bilaterally.  1+ synovitis bilaterally.  Medial joint line tenderness bilaterally.  Normal patella tracking bilaterally.  2+ dorsalis pedis pulses.    Neurological: She is alert and oriented to person, place, and time.  Skin: Skin is dry.  Psychiatric: She has a normal mood and affect. Her behavior is normal.    Vital signs in last 24 hours: Temp:  [98.4 F (36.9 C)] 98.4 F (36.9 C) (05/25 1500) Pulse Rate:  [71] 71 (05/25 1500) BP: (122)/(79) 122/79 mmHg (05/25 1500) SpO2:  [98 %] 98 % (05/25 1500) Weight:  [89.812 kg (198 lb)] 89.812 kg (198 lb) (05/25 1500)  Labs:   Estimated body mass index is 31.97 kg/(m^2) as calculated from the following:   Height as of this encounter:  (1.676 m).   Weight as of this encounter: 89.812 kg (198 lb).   Imaging Review Plain radiographs demonstrate severe degenerative joint disease of the left knee(s). The overall alignment issignificant  varus. The bone quality appears to be good for age and reported activity level.  Assessment/Plan:  End stage arthritis, left knee  Principal Problem:   Primary localized osteoarthritis of left knee Active Problems:   Type 2 diabetes, uncontrolled, with neuropathy   HYPERCHOLESTEROLEMIA   HYPERTENSION, BENIGN SYSTEMIC   Atrial fibrillation   Sleep apnea   Chronic systolic heart failure   Diabetic neuropathy   Asthma, moderate  persistent   CKD (chronic kidney disease) stage 2, GFR 60-89 ml/min   ICD (St. Jude Protecta dual-chamber),secondary prevention (VF arrest) January 2012   Obesity (BMI 30-39.9)   The patient history, physical examination, clinical judgment of the provider and imaging studies are consistent with end stage degenerative joint disease of the left knee(s) and total knee arthroplasty is deemed medically necessary. The treatment options including medical management, injection therapy arthroscopy and arthroplasty were discussed at length. The risks and benefits of total knee arthroplasty were presented and reviewed. The risks due to aseptic loosening, infection, stiffness, patella tracking problems, thromboembolic complications and other imponderables were discussed. The patient acknowledged the explanation, agreed to proceed with the plan and consent was signed. Patient is being admitted for inpatient treatment for surgery, pain control, PT, OT, prophylactic antibiotics, VTE prophylaxis, progressive ambulation and ADL's and discharge planning. The patient is planning to be discharged home with home health services

## 2014-09-06 ENCOUNTER — Telehealth: Payer: Self-pay | Admitting: Pharmacist Clinician (PhC)/ Clinical Pharmacy Specialist

## 2014-09-06 NOTE — Telephone Encounter (Signed)
Patient states she is getting ready to have knee surgery and wants to know when to stop her Coumadin.

## 2014-09-06 NOTE — Pre-Procedure Instructions (Signed)
[name]  09/06/2014      [id number], [place] - [address] [place] [address] Phone: [phone] Fax: [phone]  [facility] [place] - [address] [place] [address] Phone: [phone] Fax: [phone]  [facility] - [place], [place] - [address] [place] [address] Phone: [phone] Fax: [phone]  [facility] [address] [place], [place] - [address] [place] [address] Phone: [phone] Fax: [phone]    Your procedure is scheduled on Mon, June 6 @ 7:15 AM  Report to [facility]  at 5:30 AM  Call this number if you have problems the morning of surgery:  [phone]   Remember:  Do not eat food or drink liquids after midnight.  Take these medicines the morning of surgery with A SIP OF WATER:Pulmicort(Budesonide),Wellbutrin(Bupropion),Digitek,Flonase(Fluticasone),Pain Pill(if needed),Xopenex<Bring Your Inhaler With You>,Metoprolol(Lopressor),Nexium(Esomeprazole),and Loratadine(Claritin)               Stop taking your Coumadin as you have been instructed along with your Fish Oil. No Goody's,BC's,Aleve,Aspirin,Ibuprofen,or any Herbal Medications.    Do not wear jewelry, make-up or nail polish.  Do not wear lotions, powders, or perfumes.  You may wear deodorant.  Do not shave 48 hours prior to surgery.  Men may shave face and neck.  Do not bring valuables to the hospital.  [facility] is not responsible for any belongings or valuables.  Contacts, dentures or bridgework may not be worn into surgery.  Leave your suitcase in the car.  After surgery it may be brought to your room.  For patients admitted to the hospital, discharge time will be determined by your treatment team.  Patients discharged the day of surgery  will not be allowed to drive home.    Special instructions:  [place] - Preparing for Surgery  Before surgery, you can play an important role.  Because skin is not sterile, your skin needs to be as free of germs as possible.  You can reduce the number of germs on you skin by washing with CHG (chlorahexidine gluconate) soap before surgery.  CHG is an antiseptic cleaner which kills germs and bonds with the skin to continue killing germs even after washing.  Please DO NOT use if you have an allergy to CHG or antibacterial soaps.  If your skin becomes reddened/irritated stop using the CHG and inform your nurse when you arrive at Short Stay.  Do not shave (including legs and underarms) for at least 48 hours prior to the first CHG shower.  You may shave your face.  Please follow these instructions carefully:   1.  Shower with CHG Soap the night before surgery and the                                morning of Surgery.  2.  If you choose to wash your hair, wash your hair first as usual with your       normal shampoo.  3.  After you shampoo, rinse your hair and body thoroughly to remove the                      Shampoo.  4.  Use CHG as you would any other liquid soap.  You can apply chg directly       to the skin and wash gently with scrungie or a clean washcloth.  5.  Apply the CHG Soap to your body ONLY FROM THE NECK DOWN.        Do not use on open wounds or open sores.  Avoid contact with your eyes,       ears, mouth and genitals (private parts).  Wash genitals (private parts)       with your normal soap.  6.  Wash thoroughly, paying special attention to the area where your surgery        will be performed.  7.  Thoroughly rinse your body with warm water from the neck down.  8.  DO NOT shower/wash with your normal soap after using and rinsing off       the CHG Soap.  9.  Pat yourself dry with a clean towel.            10.  Wear clean pajamas.            11.  Place clean sheets on your bed the  night of your first shower and do not        sleep with pets.  Day of Surgery  Do not apply any lotions/deoderants the morning of surgery.  Please wear clean clothes to the hospital/surgery center.    Please read over the following fact sheets that you were given. Pain Booklet, Coughing and Deep Breathing, Blood Transfusion Information, MRSA Information and Surgical Site Infection Prevention

## 2014-09-07 ENCOUNTER — Encounter (HOSPITAL_COMMUNITY)
Admission: RE | Admit: 2014-09-07 | Discharge: 2014-09-07 | Disposition: A | Discharge status: Home / Self Care | Payer: Medicare Other | Source: Ambulatory Visit | Attending: Physician Assistant | Admitting: Physician Assistant

## 2014-09-07 ENCOUNTER — Encounter (HOSPITAL_COMMUNITY): Payer: Self-pay

## 2014-09-07 ENCOUNTER — Encounter (HOSPITAL_COMMUNITY)
Admission: RE | Admit: 2014-09-07 | Discharge: 2014-09-07 | Disposition: A | Discharge status: Home / Self Care | Payer: Medicare Other | Source: Ambulatory Visit | Attending: Orthopedic Surgery | Admitting: Orthopedic Surgery

## 2014-09-07 DIAGNOSIS — K219 Gastro-esophageal reflux disease without esophagitis: Secondary | ICD-10-CM | POA: Insufficient documentation

## 2014-09-07 DIAGNOSIS — Z95810 Presence of automatic (implantable) cardiac defibrillator: Secondary | ICD-10-CM | POA: Diagnosis not present

## 2014-09-07 DIAGNOSIS — I517 Cardiomegaly: Secondary | ICD-10-CM | POA: Diagnosis not present

## 2014-09-07 DIAGNOSIS — Z01812 Encounter for preprocedural laboratory examination: Secondary | ICD-10-CM | POA: Diagnosis not present

## 2014-09-07 DIAGNOSIS — Z0183 Encounter for blood typing: Secondary | ICD-10-CM | POA: Diagnosis not present

## 2014-09-07 DIAGNOSIS — E785 Hyperlipidemia, unspecified: Secondary | ICD-10-CM | POA: Insufficient documentation

## 2014-09-07 DIAGNOSIS — G4733 Obstructive sleep apnea (adult) (pediatric): Secondary | ICD-10-CM | POA: Diagnosis not present

## 2014-09-07 DIAGNOSIS — M179 Osteoarthritis of knee, unspecified: Secondary | ICD-10-CM | POA: Insufficient documentation

## 2014-09-07 DIAGNOSIS — Z7901 Long term (current) use of anticoagulants: Secondary | ICD-10-CM | POA: Diagnosis not present

## 2014-09-07 DIAGNOSIS — Z794 Long term (current) use of insulin: Secondary | ICD-10-CM | POA: Insufficient documentation

## 2014-09-07 DIAGNOSIS — Z79899 Other long term (current) drug therapy: Secondary | ICD-10-CM | POA: Insufficient documentation

## 2014-09-07 DIAGNOSIS — I10 Essential (primary) hypertension: Secondary | ICD-10-CM | POA: Diagnosis not present

## 2014-09-07 DIAGNOSIS — E119 Type 2 diabetes mellitus without complications: Secondary | ICD-10-CM | POA: Insufficient documentation

## 2014-09-07 DIAGNOSIS — I429 Cardiomyopathy, unspecified: Secondary | ICD-10-CM | POA: Insufficient documentation

## 2014-09-07 DIAGNOSIS — J45909 Unspecified asthma, uncomplicated: Secondary | ICD-10-CM | POA: Insufficient documentation

## 2014-09-07 DIAGNOSIS — I4891 Unspecified atrial fibrillation: Secondary | ICD-10-CM | POA: Diagnosis not present

## 2014-09-07 DIAGNOSIS — I509 Heart failure, unspecified: Secondary | ICD-10-CM | POA: Insufficient documentation

## 2014-09-07 DIAGNOSIS — Z01818 Encounter for other preprocedural examination: Secondary | ICD-10-CM | POA: Insufficient documentation

## 2014-09-07 HISTORY — DX: Adverse effect of unspecified anesthetic, initial encounter: T41.45XA

## 2014-09-07 HISTORY — DX: Cardiac arrhythmia, unspecified: I49.9

## 2014-09-07 HISTORY — DX: Other complications of anesthesia, initial encounter: T88.59XA

## 2014-09-07 HISTORY — DX: Personal history of other diseases of the digestive system: Z87.19

## 2014-09-07 HISTORY — DX: Essential (primary) hypertension: I10

## 2014-09-07 LAB — SURGICAL PCR SCREEN
MRSA, PCR: NEGATIVE
Staphylococcus aureus: NEGATIVE

## 2014-09-07 LAB — COMPREHENSIVE METABOLIC PANEL
ALBUMIN: 3.3 g/dL — AB (ref 3.5–5.0)
ALK PHOS: 100 U/L (ref 38–126)
ALT: 29 U/L (ref 14–54)
ANION GAP: 6 (ref 5–15)
AST: 22 U/L (ref 15–41)
BUN: 17 mg/dL (ref 6–20)
CALCIUM: 8.5 mg/dL — AB (ref 8.9–10.3)
CHLORIDE: 103 mmol/L (ref 101–111)
CO2: 26 mmol/L (ref 22–32)
Creatinine, Ser: 0.93 mg/dL (ref 0.44–1.00)
GFR calc Af Amer: >60 mL/min (ref >60)
GFR calc non Af Amer: >60 mL/min (ref >60)
Glucose, Bld: 184 mg/dL — ABNORMAL HIGH (ref 65–99)
POTASSIUM: 4.3 mmol/L (ref 3.5–5.1)
Sodium: 135 mmol/L (ref 135–145)
TOTAL PROTEIN: 7.3 g/dL (ref 6.5–8.1)
Total Bilirubin: 0.6 mg/dL (ref 0.3–1.2)

## 2014-09-07 LAB — CBC WITH DIFFERENTIAL/PLATELET
Basophils Absolute: 0 10*3/uL (ref 0.0–0.1)
Basophils Relative: 0 % (ref 0–1)
EOS ABS: 0.2 10*3/uL (ref 0.0–0.7)
EOS PCT: 4 % (ref 0–5)
HCT: 36.7 % (ref 36.0–46.0)
Hemoglobin: 11.7 g/dL — ABNORMAL LOW (ref 12.0–15.0)
Lymphocytes Relative: 45 % (ref 12–46)
Lymphs Abs: 1.8 10*3/uL (ref 0.7–4.0)
MCH: 28.2 pg (ref 26.0–34.0)
MCHC: 31.9 g/dL (ref 30.0–36.0)
MCV: 88.4 fL (ref 78.0–100.0)
Monocytes Absolute: 0.5 10*3/uL (ref 0.1–1.0)
Monocytes Relative: 11 % (ref 3–12)
NEUTROS ABS: 1.6 10*3/uL — AB (ref 1.7–7.7)
Neutrophils Relative %: 40 % — ABNORMAL LOW (ref 43–77)
Platelets: 215 10*3/uL (ref 150–400)
RBC: 4.15 MIL/uL (ref 3.87–5.11)
RDW: 13.3 % (ref 11.5–15.5)
WBC: 4.1 10*3/uL (ref 4.0–10.5)

## 2014-09-07 LAB — TYPE AND SCREEN
ABO/RH(D): B POS
ANTIBODY SCREEN: NEGATIVE

## 2014-09-07 LAB — URINALYSIS, ROUTINE W REFLEX MICROSCOPIC
BILIRUBIN URINE: NEGATIVE
GLUCOSE, UA: NEGATIVE mg/dL
Hgb urine dipstick: NEGATIVE
KETONES UR: NEGATIVE mg/dL
Leukocytes, UA: NEGATIVE
NITRITE: NEGATIVE
PH: 7 (ref 5.0–8.0)
Protein, ur: NEGATIVE mg/dL
Specific Gravity, Urine: 1.026 (ref 1.005–1.030)
UROBILINOGEN UA: 1 mg/dL (ref 0.0–1.0)

## 2014-09-07 LAB — PROTIME-INR
INR: 2.16 — ABNORMAL HIGH (ref 0.00–1.49)
Prothrombin Time: 23.9 seconds — ABNORMAL HIGH (ref 11.6–15.2)

## 2014-09-07 LAB — GLUCOSE, CAPILLARY: GLUCOSE-CAPILLARY: 211 mg/dL — AB (ref 65–99)

## 2014-09-07 LAB — APTT: APTT: 54 s — AB (ref 24–37)

## 2014-09-07 NOTE — Progress Notes (Signed)
PCP is [facility] and Cardiologist is [facility]. Patient denied having any acute cardiac or pulmonary issues. Patient has a Medtronic ICD. Patient last used Xoponex inhaler over a month ago.   Nurse inquired about fasting blood glucose and patient stated she did not check her blood sugar this morning. Upon arrival to PAT it was 211. Patient stated she had not consumed any food, however she did take 40 units of Lantus insulin. Patient stated the highest her blood glucose has been was in the 190s and the lowest was 117.   Nurse also inquired about when patient was to stop taking Coumadin and patient stated she called Dr. Erin Hearing office about it, however they have no returned her call yet, but usually she has to stop taking it 5 days before surgery. Patient was instructed to return a call to Dr Croitoru's office if they did not called her. Patient verbalized understanding.   Patients daughter at chair side during PAT visit.   Will notify Leta Jungling (Medtr[employer]) of patients ICD.

## 2014-09-07 NOTE — Progress Notes (Signed)
Nurse called Leta Jungling (Medtronic) and informed her of patients surgery date and the instructions from the perioperative implanted device sheet. Weston Brass at OR front desk informed of ICD as well at [phone].

## 2014-09-07 NOTE — Pre-Procedure Instructions (Signed)
[name]  09/07/2014     Your procedure is scheduled on Mon, June 6 @ 7:15 AM  Report to [facility]  at 5:30 AM  Call this number if you have problems the morning of surgery: [phone]    Remember:  Do not eat food or drink liquids after midnight.  Take these medicines the morning of surgery with A SIP OF WATER:Pulmicort(Budesonide),Wellbutrin(Bupropion), Digitek,Flonase(Fluticasone), Pain Pill(if needed), Xopenex<Bring Your Inhaler With You>, Metoprolol(Lopressor), Nexium(Esomeprazole), and Loratadine(Claritin), and Tikosyn               Stop taking your Coumadin as you have been instructed along with your Fish Oil. No Goody's,BC's,Aleve,Aspirin,Ibuprofen,or any Herbal Medications on May 30th   Do NOT take any Novolog insulin the morning of your surgery   Take 20 Units of Lantus insulin the morning of your surgery   Bring your CPAP mask the day of surgery   Do not wear jewelry, make-up or nail polish.  Do not wear lotions, powders, or perfumes.    Do not shave 48 hours prior to surgery.    Do not bring valuables to the hospital.  [facility] is not responsible for any belongings or valuables.  Contacts, dentures or bridgework may not be worn into surgery.  Leave your suitcase in the car.  After surgery it may be brought to your room.  For patients admitted to the hospital, discharge time will be determined by your treatment team.  Patients discharged the day of surgery will not be allowed to drive home.    Special instructions:  [facility] - Preparing for Surgery  Before surgery, you can play an important role.  Because skin is not sterile, your skin needs to be as free of germs as possible.  You can reduce the number of germs on you skin by washing with CHG (chlorahexidine gluconate) soap before surgery.  CHG is an antiseptic cleaner which kills germs and bonds with the skin to continue killing germs even after washing.  Please DO NOT use if you have an  allergy to CHG or antibacterial soaps.  If your skin becomes reddened/irritated stop using the CHG and inform your nurse when you arrive at Short Stay.  Do not shave (including legs and underarms) for at least 48 hours prior to the first CHG shower.  You may shave your face.  Please follow these instructions carefully:   1.  Shower with CHG Soap the night before surgery and the                                morning of Surgery.  2.  If you choose to wash your hair, wash your hair first as usual with your       normal shampoo.  3.  After you shampoo, rinse your hair and body thoroughly to remove the                      Shampoo.  4.  Use CHG as you would any other liquid soap.  You can apply chg directly       to the skin and wash gently with scrungie or a clean washcloth.  5.  Apply the CHG Soap to your body ONLY FROM THE NECK DOWN.        Do not use on open wounds or open sores.  Avoid contact with your eyes,  ears, mouth and genitals (private parts).  Wash genitals (private parts)       with your normal soap.  6.  Wash thoroughly, paying special attention to the area where your surgery        will be performed.  7.  Thoroughly rinse your body with warm water from the neck down.  8.  DO NOT shower/wash with your normal soap after using and rinsing off       the CHG Soap.  9.  Pat yourself dry with a clean towel.            10.  Wear clean pajamas.            11.  Place clean sheets on your bed the night of your first shower and do not        sleep with pets.  Day of Surgery  Do not apply any lotions/deoderants the morning of surgery.  Please wear clean clothes to the hospital/surgery center.    Please read over the following fact sheets that you were given. Pain Booklet, Coughing and Deep Breathing, Blood Transfusion Information, MRSA Information and Surgical Site Infection Prevention

## 2014-09-07 NOTE — Pre-Procedure Instructions (Deleted)
[name]  09/07/2014     Your procedure is scheduled on Mon, June 6 @ 7:15 AM  Report to [facility]  at 5:30 AM  Call this number if you have problems the morning of surgery: [phone]    Remember:  Do not eat food or drink liquids after midnight.  Take these medicines the morning of surgery with A SIP OF WATER:Pulmicort(Budesonide),Wellbutrin(Bupropion), Digitek,Flonase(Fluticasone), Pain Pill(if needed), Xopenex<Bring Your Inhaler With You>, Metoprolol(Lopressor), Nexium(Esomeprazole), and Loratadine(Claritin)               Stop taking your Coumadin as you have been instructed along with your Fish Oil. No Goody's,BC's,Aleve,Aspirin,Ibuprofen,or any Herbal Medications on May 30th   Do NOT take any Novolog insulin the morning of your surgery   Take 20 Units of Lantus insulin the morning of your surgery   Bring your CPAP mask the day of surgery   Do not wear jewelry, make-up or nail polish.  Do not wear lotions, powders, or perfumes.    Do not shave 48 hours prior to surgery.    Do not bring valuables to the hospital.  [facility] is not responsible for any belongings or valuables.  Contacts, dentures or bridgework may not be worn into surgery.  Leave your suitcase in the car.  After surgery it may be brought to your room.  For patients admitted to the hospital, discharge time will be determined by your treatment team.  Patients discharged the day of surgery will not be allowed to drive home.    Special instructions:  [facility] - Preparing for Surgery  Before surgery, you can play an important role.  Because skin is not sterile, your skin needs to be as free of germs as possible.  You can reduce the number of germs on you skin by washing with CHG (chlorahexidine gluconate) soap before surgery.  CHG is an antiseptic cleaner which kills germs and bonds with the skin to continue killing germs even after washing.  Please DO NOT use if you have an allergy to CHG or  antibacterial soaps.  If your skin becomes reddened/irritated stop using the CHG and inform your nurse when you arrive at Short Stay.  Do not shave (including legs and underarms) for at least 48 hours prior to the first CHG shower.  You may shave your face.  Please follow these instructions carefully:   1.  Shower with CHG Soap the night before surgery and the                                morning of Surgery.  2.  If you choose to wash your hair, wash your hair first as usual with your       normal shampoo.  3.  After you shampoo, rinse your hair and body thoroughly to remove the                      Shampoo.  4.  Use CHG as you would any other liquid soap.  You can apply chg directly       to the skin and wash gently with scrungie or a clean washcloth.  5.  Apply the CHG Soap to your body ONLY FROM THE NECK DOWN.        Do not use on open wounds or open sores.  Avoid contact with your eyes,  ears, mouth and genitals (private parts).  Wash genitals (private parts)       with your normal soap.  6.  Wash thoroughly, paying special attention to the area where your surgery        will be performed.  7.  Thoroughly rinse your body with warm water from the neck down.  8.  DO NOT shower/wash with your normal soap after using and rinsing off       the CHG Soap.  9.  Pat yourself dry with a clean towel.            10.  Wear clean pajamas.            11.  Place clean sheets on your bed the night of your first shower and do not        sleep with pets.  Day of Surgery  Do not apply any lotions/deoderants the morning of surgery.  Please wear clean clothes to the hospital/surgery center.    Please read over the following fact sheets that you were given. Pain Booklet, Coughing and Deep Breathing, Blood Transfusion Information, MRSA Information and Surgical Site Infection Prevention

## 2014-09-09 LAB — URINE CULTURE

## 2014-09-10 NOTE — Progress Notes (Addendum)
Anesthesia Chart Review:  Pt is 64 year old female scheduled for L total knee arthroplasty on 09/17/2014 with Dr. Thurston Hole.   Cardiologist is Dr. Royann Shivers, last office visit 06/26/2014. PCP is Dr. Garry Heater at [facility]. Pulmonologist is Dr. Shan Levans, last office visit 08/01/2014 for f/u asthma.   PMH includes: atrial fibrillation (recurrent despite ablation x2 2007, 2011), CHF, AICD (medtronic), nonischemic cardiomyopathy, cardiac arrest (04/2010), HTN, DM, OSA (CPAP), hyperlipidemia, anemia, GERD, asthma. Has difficult time waking up after anesthesia. Never smoker. BMI 32.  Medications include: digoxin, dofetilide, insulin, losartan, metoprolol, nexium, spironolactone, crestor, demadex, coumadin. Pt to hold coumadin 5 days prior to surgery.   Preoperative labs reviewed.  PTT 54. PT/INR 23.9/2.16. Will repeat coags DOS. Pt has UTI on urine culture; notified Sherri in Dr. Sherene Sires office of UTI. Glucose 184. Last HgbA1c was 8.9 08/08/14.   Chest x-ray 09/07/2014 reviewed. No acute cardiopulmonary disease. Cardiac pacer in stable position. Stable cardiomegaly.   EKG 06/26/2014: atrial paced rhythm with occasional ventricular paced complexes.   Echo 08/08/2010:  -EF 35-45% -LV systolic function is moderately reduced. There is moderate global hypokinesis of the LV.  -There is a pacemaker lead in the RV -There RV systolic function is moderately reduced.  -There is mild mitral regurgitation -Trace aortic regurgitation  Cardiac cath 05/01/2010: 1. No occlusive coronary disease. 2. Severe LV dysfunction with ejection fraction of 15-20%. -This is clearly consistent with a nonischemic cardiomyopathy. She will need an AICD. She has a narrow QRS, so biventricular wise, it is probably not a option.  Perioperative prescription for  ICD programming notes procedure may interfere with device function. Magnet should be placed over device during procedure.  No post-op interrogation  needed.   Pt has cardiac clearance from Dr. Royann Shivers in letter dated 06/26/2014. He notes "it is important that her cardiac medications, especially metoprolol and dofetilide, are not interrupted in the perioperative period".   Pt has medical clearance from PCP in telephone encounter dated 08/28/2014 by Dr. Caroleen Hamman.   Dr. Delford Field is aware of upcoming surgery.   If UTI cleared and coags acceptable DOS, I anticipate pt can proceed as scheduled.   Rica Mast, FNP-BC [facility] Stay [facility]/Anesthesiology Phone: [phone] 09/10/2014 10:38 AM

## 2014-09-11 DIAGNOSIS — M25561 Pain in right knee: Secondary | ICD-10-CM | POA: Diagnosis not present

## 2014-09-11 DIAGNOSIS — M170 Bilateral primary osteoarthritis of knee: Secondary | ICD-10-CM | POA: Diagnosis not present

## 2014-09-14 ENCOUNTER — Other Ambulatory Visit: Payer: Self-pay | Admitting: Family Medicine

## 2014-09-14 MED ORDER — INSULIN PEN NEEDLE 31G X 8 MM MISC: Status: DC

## 2014-09-16 MED ORDER — CEFAZOLIN SODIUM-DEXTROSE 2-3 GM-% IV SOLR
2.0000 g | INTRAVENOUS | Status: AC
  Administered 2014-09-17: 2 g via INTRAVENOUS
  Filled 2014-09-16: qty 50

## 2014-09-17 ENCOUNTER — Encounter (HOSPITAL_COMMUNITY): Payer: Self-pay | Admitting: *Deleted

## 2014-09-17 ENCOUNTER — Inpatient Hospital Stay (HOSPITAL_COMMUNITY): Payer: Medicare Other | Admitting: Emergency Medicine

## 2014-09-17 ENCOUNTER — Inpatient Hospital Stay (HOSPITAL_COMMUNITY)
Admission: RE | Admit: 2014-09-17 | Discharge: 2014-09-20 | DRG: 470 | Disposition: A | Discharge status: Home / Self Care | Payer: Medicare Other | Source: Ambulatory Visit | Attending: Orthopedic Surgery | Admitting: Orthopedic Surgery

## 2014-09-17 ENCOUNTER — Encounter (HOSPITAL_COMMUNITY)
Admission: RE | Disposition: A | Discharge status: Home / Self Care | Payer: Self-pay | Source: Ambulatory Visit | Attending: Orthopedic Surgery

## 2014-09-17 ENCOUNTER — Inpatient Hospital Stay (HOSPITAL_COMMUNITY): Payer: Medicare Other | Admitting: Anesthesiology

## 2014-09-17 DIAGNOSIS — I480 Paroxysmal atrial fibrillation: Secondary | ICD-10-CM | POA: Diagnosis present

## 2014-09-17 DIAGNOSIS — Z794 Long term (current) use of insulin: Secondary | ICD-10-CM

## 2014-09-17 DIAGNOSIS — Z881 Allergy status to other antibiotic agents status: Secondary | ICD-10-CM

## 2014-09-17 DIAGNOSIS — N182 Chronic kidney disease, stage 2 (mild): Secondary | ICD-10-CM | POA: Diagnosis present

## 2014-09-17 DIAGNOSIS — E7800 Pure hypercholesterolemia, unspecified: Secondary | ICD-10-CM | POA: Diagnosis present

## 2014-09-17 DIAGNOSIS — Z888 Allergy status to other drugs, medicaments and biological substances status: Secondary | ICD-10-CM | POA: Diagnosis not present

## 2014-09-17 DIAGNOSIS — M179 Osteoarthritis of knee, unspecified: Secondary | ICD-10-CM | POA: Diagnosis not present

## 2014-09-17 DIAGNOSIS — M1712 Unilateral primary osteoarthritis, left knee: Secondary | ICD-10-CM | POA: Diagnosis present

## 2014-09-17 DIAGNOSIS — E1140 Type 2 diabetes mellitus with diabetic neuropathy, unspecified: Secondary | ICD-10-CM | POA: Diagnosis present

## 2014-09-17 DIAGNOSIS — Z6832 Body mass index (BMI) 32.0-32.9, adult: Secondary | ICD-10-CM | POA: Diagnosis not present

## 2014-09-17 DIAGNOSIS — J4540 Moderate persistent asthma, uncomplicated: Secondary | ICD-10-CM | POA: Diagnosis present

## 2014-09-17 DIAGNOSIS — G4730 Sleep apnea, unspecified: Secondary | ICD-10-CM | POA: Diagnosis present

## 2014-09-17 DIAGNOSIS — Z95810 Presence of automatic (implantable) cardiac defibrillator: Secondary | ICD-10-CM | POA: Diagnosis present

## 2014-09-17 DIAGNOSIS — Z7901 Long term (current) use of anticoagulants: Secondary | ICD-10-CM

## 2014-09-17 DIAGNOSIS — I5022 Chronic systolic (congestive) heart failure: Secondary | ICD-10-CM | POA: Diagnosis present

## 2014-09-17 DIAGNOSIS — Z91040 Latex allergy status: Secondary | ICD-10-CM

## 2014-09-17 DIAGNOSIS — I4891 Unspecified atrial fibrillation: Secondary | ICD-10-CM | POA: Diagnosis present

## 2014-09-17 DIAGNOSIS — I10 Essential (primary) hypertension: Secondary | ICD-10-CM | POA: Diagnosis present

## 2014-09-17 DIAGNOSIS — Z79899 Other long term (current) drug therapy: Secondary | ICD-10-CM | POA: Diagnosis not present

## 2014-09-17 DIAGNOSIS — J45909 Unspecified asthma, uncomplicated: Secondary | ICD-10-CM | POA: Diagnosis not present

## 2014-09-17 DIAGNOSIS — R0602 Shortness of breath: Secondary | ICD-10-CM

## 2014-09-17 DIAGNOSIS — E669 Obesity, unspecified: Secondary | ICD-10-CM | POA: Diagnosis present

## 2014-09-17 DIAGNOSIS — M25562 Pain in left knee: Secondary | ICD-10-CM | POA: Diagnosis not present

## 2014-09-17 DIAGNOSIS — I129 Hypertensive chronic kidney disease with stage 1 through stage 4 chronic kidney disease, or unspecified chronic kidney disease: Secondary | ICD-10-CM | POA: Diagnosis present

## 2014-09-17 DIAGNOSIS — E1165 Type 2 diabetes mellitus with hyperglycemia: Secondary | ICD-10-CM | POA: Diagnosis present

## 2014-09-17 DIAGNOSIS — M1710 Unilateral primary osteoarthritis, unspecified knee: Secondary | ICD-10-CM | POA: Diagnosis present

## 2014-09-17 DIAGNOSIS — G8918 Other acute postprocedural pain: Secondary | ICD-10-CM | POA: Diagnosis not present

## 2014-09-17 DIAGNOSIS — K219 Gastro-esophageal reflux disease without esophagitis: Secondary | ICD-10-CM | POA: Diagnosis present

## 2014-09-17 DIAGNOSIS — G4733 Obstructive sleep apnea (adult) (pediatric): Secondary | ICD-10-CM | POA: Diagnosis present

## 2014-09-17 DIAGNOSIS — Z9989 Dependence on other enabling machines and devices: Secondary | ICD-10-CM

## 2014-09-17 DIAGNOSIS — I429 Cardiomyopathy, unspecified: Secondary | ICD-10-CM | POA: Diagnosis present

## 2014-09-17 HISTORY — DX: Unilateral primary osteoarthritis, left knee: M17.12

## 2014-09-17 HISTORY — PX: TOTAL KNEE ARTHROPLASTY: SHX125

## 2014-09-17 LAB — URINALYSIS W MICROSCOPIC (NOT AT ARMC)
Bilirubin Urine: NEGATIVE
GLUCOSE, UA: 500 mg/dL — AB
Hgb urine dipstick: NEGATIVE
KETONES UR: NEGATIVE mg/dL
LEUKOCYTES UA: NEGATIVE
NITRITE: NEGATIVE
Protein, ur: NEGATIVE mg/dL
SPECIFIC GRAVITY, URINE: 1.026 (ref 1.005–1.030)
Urobilinogen, UA: 0.2 mg/dL (ref 0.0–1.0)
pH: 5.5 (ref 5.0–8.0)

## 2014-09-17 LAB — CREATININE, SERUM
CREATININE: 0.99 mg/dL (ref 0.44–1.00)
GFR calc Af Amer: >60 mL/min (ref >60)
GFR calc non Af Amer: 59 mL/min — ABNORMAL LOW (ref >60)

## 2014-09-17 LAB — CBC
HEMATOCRIT: 32.2 % — AB (ref 36.0–46.0)
Hemoglobin: 10.3 g/dL — ABNORMAL LOW (ref 12.0–15.0)
MCH: 28.1 pg (ref 26.0–34.0)
MCHC: 32 g/dL (ref 30.0–36.0)
MCV: 88 fL (ref 78.0–100.0)
Platelets: 204 10*3/uL (ref 150–400)
RBC: 3.66 MIL/uL — ABNORMAL LOW (ref 3.87–5.11)
RDW: 13.1 % (ref 11.5–15.5)
WBC: 8.1 10*3/uL (ref 4.0–10.5)

## 2014-09-17 LAB — GLUCOSE, CAPILLARY
Glucose-Capillary: 169 mg/dL — ABNORMAL HIGH (ref 65–99)
Glucose-Capillary: 138 mg/dL — ABNORMAL HIGH (ref 65–99)
Glucose-Capillary: 181 mg/dL — ABNORMAL HIGH (ref 65–99)
Glucose-Capillary: 243 mg/dL — ABNORMAL HIGH (ref 65–99)
Glucose-Capillary: 328 mg/dL — ABNORMAL HIGH (ref 65–99)

## 2014-09-17 LAB — APTT: APTT: 26 s (ref 24–37)

## 2014-09-17 LAB — PROTIME-INR
INR: 1.24 (ref 0.00–1.49)
PROTHROMBIN TIME: 15.8 s — AB (ref 11.6–15.2)

## 2014-09-17 SURGERY — ARTHROPLASTY, KNEE, TOTAL
Anesthesia: Regional | Site: Knee | Laterality: Left

## 2014-09-17 MED ORDER — BUPROPION HCL ER (XL) 150 MG PO TB24
150.0000 mg | ORAL_TABLET | Freq: Every day | ORAL | Status: DC
  Administered 2014-09-18 – 2014-09-20 (×3): 150 mg via ORAL
  Filled 2014-09-17 (×4): qty 1

## 2014-09-17 MED ORDER — BUDESONIDE 0.25 MG/2ML IN SUSP
0.2500 mg | Freq: Two times a day (BID) | RESPIRATORY_TRACT | Status: DC
  Administered 2014-09-17 – 2014-09-19 (×4): 0.25 mg via RESPIRATORY_TRACT
  Filled 2014-09-17 (×5): qty 2

## 2014-09-17 MED ORDER — HYDROMORPHONE HCL 1 MG/ML IJ SOLN
0.2500 mg | INTRAMUSCULAR | Status: DC | PRN
  Administered 2014-09-17 (×2): 0.5 mg via INTRAVENOUS

## 2014-09-17 MED ORDER — ACETAMINOPHEN 325 MG PO TABS
650.0000 mg | ORAL_TABLET | Freq: Four times a day (QID) | ORAL | Status: DC | PRN
  Administered 2014-09-17: 650 mg via ORAL
  Filled 2014-09-17 (×2): qty 2

## 2014-09-17 MED ORDER — ONDANSETRON HCL 4 MG/2ML IJ SOLN
INTRAMUSCULAR | Status: DC | PRN
  Administered 2014-09-17: 4 mg via INTRAVENOUS

## 2014-09-17 MED ORDER — LACTATED RINGERS IV SOLN
INTRAVENOUS | Status: DC
  Administered 2014-09-17 (×2): via INTRAVENOUS

## 2014-09-17 MED ORDER — BUPIVACAINE-EPINEPHRINE 0.25% -1:200000 IJ SOLN
INTRAMUSCULAR | Status: DC | PRN
  Administered 2014-09-17: 30 mL

## 2014-09-17 MED ORDER — SODIUM CHLORIDE 0.9 % IJ SOLN
INTRAMUSCULAR | Status: AC
  Filled 2014-09-17: qty 10

## 2014-09-17 MED ORDER — ALUM & MAG HYDROXIDE-SIMETH 200-200-20 MG/5ML PO SUSP: 30.0000 mL | ORAL | Status: DC | PRN

## 2014-09-17 MED ORDER — DOCUSATE SODIUM 100 MG PO CAPS
100.0000 mg | ORAL_CAPSULE | Freq: Two times a day (BID) | ORAL | Status: DC
  Administered 2014-09-17 – 2014-09-20 (×7): 100 mg via ORAL
  Filled 2014-09-17 (×7): qty 1

## 2014-09-17 MED ORDER — POLYETHYLENE GLYCOL 3350 17 GM/SCOOP PO POWD
17.0000 g | Freq: Two times a day (BID) | ORAL | Status: DC
  Filled 2014-09-17: qty 255

## 2014-09-17 MED ORDER — PHENOL 1.4 % MT LIQD: 1.0000 | OROMUCOSAL | Status: DC | PRN

## 2014-09-17 MED ORDER — BUPIVACAINE-EPINEPHRINE (PF) 0.25% -1:200000 IJ SOLN
INTRAMUSCULAR | Status: AC
  Filled 2014-09-17: qty 30

## 2014-09-17 MED ORDER — LORATADINE 10 MG PO TABS
10.0000 mg | ORAL_TABLET | Freq: Every day | ORAL | Status: DC
Start: 2014-09-18 — End: 2014-09-20
  Administered 2014-09-18 – 2014-09-20 (×3): 10 mg via ORAL
  Filled 2014-09-17 (×3): qty 1

## 2014-09-17 MED ORDER — METOCLOPRAMIDE HCL 5 MG PO TABS: 5.0000 mg | ORAL_TABLET | Freq: Three times a day (TID) | ORAL | Status: DC | PRN

## 2014-09-17 MED ORDER — HYDROMORPHONE HCL 1 MG/ML IJ SOLN
INTRAMUSCULAR | Status: AC
  Administered 2014-09-17: 0.5 mg via INTRAVENOUS
  Filled 2014-09-17: qty 1

## 2014-09-17 MED ORDER — CHLORHEXIDINE GLUCONATE 4 % EX LIQD: 60.0000 mL | Freq: Once | CUTANEOUS | Status: DC

## 2014-09-17 MED ORDER — DEXAMETHASONE SODIUM PHOSPHATE 10 MG/ML IJ SOLN
10.0000 mg | Freq: Three times a day (TID) | INTRAMUSCULAR | Status: DC
  Administered 2014-09-17 – 2014-09-20 (×9): 10 mg via INTRAVENOUS
  Filled 2014-09-17 (×10): qty 1

## 2014-09-17 MED ORDER — BUPIVACAINE-EPINEPHRINE (PF) 0.5% -1:200000 IJ SOLN
INTRAMUSCULAR | Status: DC | PRN
  Administered 2014-09-17: 25 mL via PERINEURAL

## 2014-09-17 MED ORDER — POVIDONE-IODINE 7.5 % EX SOLN
Freq: Once | CUTANEOUS | Status: DC
  Filled 2014-09-17: qty 118

## 2014-09-17 MED ORDER — SODIUM CHLORIDE 0.9 % IR SOLN
Status: DC | PRN
  Administered 2014-09-17: 3000 mL

## 2014-09-17 MED ORDER — POTASSIUM CHLORIDE IN NACL 20-0.9 MEQ/L-% IV SOLN
INTRAVENOUS | Status: DC
  Administered 2014-09-17 – 2014-09-18 (×2): via INTRAVENOUS
  Filled 2014-09-17 (×11): qty 1000

## 2014-09-17 MED ORDER — DIPHENHYDRAMINE HCL 12.5 MG/5ML PO ELIX: 12.5000 mg | ORAL_SOLUTION | ORAL | Status: DC | PRN

## 2014-09-17 MED ORDER — LEVALBUTEROL HCL 0.63 MG/3ML IN NEBU
0.6300 mg | INHALATION_SOLUTION | RESPIRATORY_TRACT | Status: DC | PRN
  Filled 2014-09-17: qty 3

## 2014-09-17 MED ORDER — CEFAZOLIN SODIUM-DEXTROSE 2-3 GM-% IV SOLR
2.0000 g | Freq: Four times a day (QID) | INTRAVENOUS | Status: AC
  Administered 2014-09-17 (×2): 2 g via INTRAVENOUS
  Filled 2014-09-17 (×3): qty 50

## 2014-09-17 MED ORDER — PROPOFOL 10 MG/ML IV BOLUS
INTRAVENOUS | Status: AC
  Filled 2014-09-17: qty 20

## 2014-09-17 MED ORDER — DEXAMETHASONE SODIUM PHOSPHATE 10 MG/ML IJ SOLN
INTRAMUSCULAR | Status: AC
  Filled 2014-09-17: qty 1

## 2014-09-17 MED ORDER — FENTANYL CITRATE (PF) 250 MCG/5ML IJ SOLN
INTRAMUSCULAR | Status: AC
  Filled 2014-09-17: qty 5

## 2014-09-17 MED ORDER — MENTHOL 3 MG MT LOZG: 1.0000 | LOZENGE | OROMUCOSAL | Status: DC | PRN

## 2014-09-17 MED ORDER — LEVALBUTEROL TARTRATE 45 MCG/ACT IN AERO: 1.0000 | INHALATION_SPRAY | RESPIRATORY_TRACT | Status: DC | PRN

## 2014-09-17 MED ORDER — MIDAZOLAM HCL 2 MG/2ML IJ SOLN
INTRAMUSCULAR | Status: AC
  Filled 2014-09-17: qty 2

## 2014-09-17 MED ORDER — WARFARIN - PHARMACIST DOSING INPATIENT: Freq: Every day | Status: DC

## 2014-09-17 MED ORDER — PHENYLEPHRINE 40 MCG/ML (10ML) SYRINGE FOR IV PUSH (FOR BLOOD PRESSURE SUPPORT)
PREFILLED_SYRINGE | INTRAVENOUS | Status: AC
  Filled 2014-09-17: qty 10

## 2014-09-17 MED ORDER — OXYCODONE HCL 5 MG PO TABS
5.0000 mg | ORAL_TABLET | ORAL | Status: DC | PRN
  Administered 2014-09-17 – 2014-09-19 (×9): 10 mg via ORAL
  Filled 2014-09-17 (×9): qty 2

## 2014-09-17 MED ORDER — INSULIN ASPART 100 UNIT/ML ~~LOC~~ SOLN
0.0000 [IU] | Freq: Three times a day (TID) | SUBCUTANEOUS | Status: DC
  Administered 2014-09-17: 4 [IU] via SUBCUTANEOUS
  Administered 2014-09-17 – 2014-09-18 (×2): 7 [IU] via SUBCUTANEOUS
  Administered 2014-09-18: 15 [IU] via SUBCUTANEOUS
  Administered 2014-09-18: 4 [IU] via SUBCUTANEOUS
  Administered 2014-09-19: 7 [IU] via SUBCUTANEOUS
  Administered 2014-09-19: 11 [IU] via SUBCUTANEOUS
  Administered 2014-09-19: 7 [IU] via SUBCUTANEOUS
  Administered 2014-09-20: 20 [IU] via SUBCUTANEOUS
  Administered 2014-09-20: 11 [IU] via SUBCUTANEOUS

## 2014-09-17 MED ORDER — INSULIN ASPART 100 UNIT/ML ~~LOC~~ SOLN: 5.0000 [IU] | SUBCUTANEOUS | Status: DC

## 2014-09-17 MED ORDER — SUCCINYLCHOLINE CHLORIDE 20 MG/ML IJ SOLN
INTRAMUSCULAR | Status: AC
  Filled 2014-09-17: qty 1

## 2014-09-17 MED ORDER — POLYETHYLENE GLYCOL 3350 17 G PO PACK
17.0000 g | PACK | Freq: Two times a day (BID) | ORAL | Status: DC
  Administered 2014-09-17 – 2014-09-20 (×7): 17 g via ORAL
  Filled 2014-09-17 (×7): qty 1

## 2014-09-17 MED ORDER — ACETAMINOPHEN 650 MG RE SUPP
650.0000 mg | Freq: Four times a day (QID) | RECTAL | Status: DC | PRN
  Filled 2014-09-17: qty 1

## 2014-09-17 MED ORDER — FLUTICASONE PROPIONATE 50 MCG/ACT NA SUSP
1.0000 | Freq: Every day | NASAL | Status: DC
  Administered 2014-09-18 – 2014-09-20 (×3): 1 via NASAL
  Filled 2014-09-17 (×2): qty 16

## 2014-09-17 MED ORDER — LIDOCAINE HCL (CARDIAC) 20 MG/ML IV SOLN
INTRAVENOUS | Status: AC
  Filled 2014-09-17: qty 5

## 2014-09-17 MED ORDER — HYDROMORPHONE HCL 1 MG/ML IJ SOLN
1.0000 mg | INTRAMUSCULAR | Status: DC | PRN
  Administered 2014-09-17 – 2014-09-18 (×5): 1 mg via INTRAVENOUS
  Filled 2014-09-17 (×5): qty 1

## 2014-09-17 MED ORDER — ROSUVASTATIN CALCIUM 10 MG PO TABS
10.0000 mg | ORAL_TABLET | Freq: Every day | ORAL | Status: DC
  Administered 2014-09-17 – 2014-09-20 (×4): 10 mg via ORAL
  Filled 2014-09-17 (×5): qty 1

## 2014-09-17 MED ORDER — DIGOXIN 125 MCG PO TABS
0.1250 mg | ORAL_TABLET | Freq: Every day | ORAL | Status: DC
  Administered 2014-09-18 – 2014-09-20 (×3): 0.125 mg via ORAL
  Filled 2014-09-17 (×4): qty 1

## 2014-09-17 MED ORDER — ALBUTEROL SULFATE (5 MG/ML) 0.5% IN NEBU
2.5000 mg | INHALATION_SOLUTION | Freq: Four times a day (QID) | RESPIRATORY_TRACT | Status: DC
  Filled 2014-09-17 (×3): qty 0.5
  Filled 2014-09-17: qty 3
  Filled 2014-09-17: qty 0.5

## 2014-09-17 MED ORDER — METOPROLOL TARTRATE 50 MG PO TABS
50.0000 mg | ORAL_TABLET | Freq: Two times a day (BID) | ORAL | Status: DC
  Administered 2014-09-18 – 2014-09-20 (×4): 50 mg via ORAL
  Filled 2014-09-17 (×7): qty 1

## 2014-09-17 MED ORDER — MIDAZOLAM HCL 5 MG/5ML IJ SOLN
INTRAMUSCULAR | Status: DC | PRN
  Administered 2014-09-17: 2 mg via INTRAVENOUS

## 2014-09-17 MED ORDER — CEFUROXIME SODIUM 1.5 G IJ SOLR
INTRAMUSCULAR | Status: AC
  Filled 2014-09-17: qty 1.5

## 2014-09-17 MED ORDER — LOSARTAN POTASSIUM 50 MG PO TABS
50.0000 mg | ORAL_TABLET | Freq: Every day | ORAL | Status: DC
  Administered 2014-09-18 – 2014-09-20 (×3): 50 mg via ORAL
  Filled 2014-09-17 (×3): qty 1

## 2014-09-17 MED ORDER — CEFUROXIME SODIUM 1.5 G IJ SOLR
INTRAMUSCULAR | Status: DC | PRN
  Administered 2014-09-17: 1.5 g

## 2014-09-17 MED ORDER — SODIUM CHLORIDE 0.9 % IR SOLN
Status: DC | PRN
  Administered 2014-09-17: 1000 mL

## 2014-09-17 MED ORDER — INSULIN GLARGINE 100 UNIT/ML ~~LOC~~ SOLN
40.0000 [IU] | Freq: Every day | SUBCUTANEOUS | Status: DC
  Administered 2014-09-17: 40 [IU] via SUBCUTANEOUS
  Filled 2014-09-17 (×2): qty 0.4

## 2014-09-17 MED ORDER — ONDANSETRON HCL 4 MG/2ML IJ SOLN
INTRAMUSCULAR | Status: AC
  Filled 2014-09-17: qty 2

## 2014-09-17 MED ORDER — OXYCODONE HCL 5 MG PO TABS
ORAL_TABLET | ORAL | Status: AC
  Administered 2014-09-17: 10 mg via ORAL
  Filled 2014-09-17: qty 2

## 2014-09-17 MED ORDER — PROPOFOL INFUSION 10 MG/ML OPTIME
INTRAVENOUS | Status: DC | PRN
  Administered 2014-09-17: 75 ug/kg/min via INTRAVENOUS

## 2014-09-17 MED ORDER — ONDANSETRON HCL 4 MG PO TABS: 4.0000 mg | ORAL_TABLET | Freq: Four times a day (QID) | ORAL | Status: DC | PRN

## 2014-09-17 MED ORDER — ONDANSETRON HCL 4 MG/2ML IJ SOLN: 4.0000 mg | Freq: Four times a day (QID) | INTRAMUSCULAR | Status: DC | PRN

## 2014-09-17 MED ORDER — ENOXAPARIN SODIUM 30 MG/0.3ML ~~LOC~~ SOLN
30.0000 mg | Freq: Two times a day (BID) | SUBCUTANEOUS | Status: DC
  Administered 2014-09-18 – 2014-09-19 (×4): 30 mg via SUBCUTANEOUS
  Filled 2014-09-17 (×5): qty 0.3

## 2014-09-17 MED ORDER — DOFETILIDE 250 MCG PO CAPS
250.0000 ug | ORAL_CAPSULE | Freq: Two times a day (BID) | ORAL | Status: DC
  Administered 2014-09-17 – 2014-09-20 (×6): 250 ug via ORAL
  Filled 2014-09-17 (×7): qty 1

## 2014-09-17 MED ORDER — PANTOPRAZOLE SODIUM 40 MG PO TBEC
40.0000 mg | DELAYED_RELEASE_TABLET | Freq: Every day | ORAL | Status: DC
  Administered 2014-09-18 – 2014-09-20 (×3): 40 mg via ORAL
  Filled 2014-09-17 (×4): qty 1

## 2014-09-17 MED ORDER — EPHEDRINE SULFATE 50 MG/ML IJ SOLN
INTRAMUSCULAR | Status: AC
  Filled 2014-09-17: qty 1

## 2014-09-17 MED ORDER — ONDANSETRON HCL 4 MG/2ML IJ SOLN
4.0000 mg | Freq: Four times a day (QID) | INTRAMUSCULAR | Status: DC | PRN
  Administered 2014-09-17 – 2014-09-20 (×4): 4 mg via INTRAVENOUS
  Filled 2014-09-17 (×4): qty 2

## 2014-09-17 MED ORDER — METOCLOPRAMIDE HCL 5 MG/ML IJ SOLN
5.0000 mg | Freq: Three times a day (TID) | INTRAMUSCULAR | Status: DC | PRN
  Administered 2014-09-18: 10 mg via INTRAVENOUS
  Filled 2014-09-17: qty 2

## 2014-09-17 MED ORDER — WARFARIN SODIUM 5 MG PO TABS
5.0000 mg | ORAL_TABLET | Freq: Once | ORAL | Status: AC
  Administered 2014-09-17: 5 mg via ORAL
  Filled 2014-09-17: qty 1

## 2014-09-17 MED ORDER — OXYCODONE HCL 5 MG/5ML PO SOLN: 5.0000 mg | Freq: Once | ORAL | Status: DC | PRN

## 2014-09-17 MED ORDER — OXYCODONE HCL 5 MG PO TABS: 5.0000 mg | ORAL_TABLET | Freq: Once | ORAL | Status: DC | PRN

## 2014-09-17 MED ORDER — CELECOXIB 200 MG PO CAPS
200.0000 mg | ORAL_CAPSULE | Freq: Two times a day (BID) | ORAL | Status: DC
  Administered 2014-09-17: 200 mg via ORAL
  Filled 2014-09-17 (×2): qty 1

## 2014-09-17 MED ORDER — ROCURONIUM BROMIDE 50 MG/5ML IV SOLN
INTRAVENOUS | Status: AC
  Filled 2014-09-17: qty 1

## 2014-09-17 MED ORDER — FENTANYL CITRATE (PF) 100 MCG/2ML IJ SOLN
INTRAMUSCULAR | Status: DC | PRN
  Administered 2014-09-17: 50 ug via INTRAVENOUS

## 2014-09-17 SURGICAL SUPPLY — 72 items
APL SKNCLS STERI-STRIP NONHPOA (GAUZE/BANDAGES/DRESSINGS) ×1
BANDAGE ESMARK 6X9 LF (GAUZE/BANDAGES/DRESSINGS) ×1 IMPLANT
BENZOIN TINCTURE PRP APPL 2/3 (GAUZE/BANDAGES/DRESSINGS) ×2 IMPLANT
BLADE SAGITTAL 25.0X1.19X90 (BLADE) ×2 IMPLANT
BLADE SAW SGTL 11.0X1.19X90.0M (BLADE) IMPLANT
BLADE SAW SGTL 13.0X1.19X90.0M (BLADE) ×2 IMPLANT
BLADE SURG 10 STRL SS (BLADE) ×4 IMPLANT
BNDG CMPR 9X6 STRL LF SNTH (GAUZE/BANDAGES/DRESSINGS) ×1
BNDG CMPR MED 15X6 ELC VLCR LF (GAUZE/BANDAGES/DRESSINGS) ×1
BNDG ELASTIC 6X15 VLCR STRL LF (GAUZE/BANDAGES/DRESSINGS) ×2 IMPLANT
BNDG ESMARK 6X9 LF (GAUZE/BANDAGES/DRESSINGS) ×2
BOWL SMART MIX CTS (DISPOSABLE) ×2 IMPLANT
CAPT KNEE TOTAL 3 ATTUNE ×1 IMPLANT
CEMENT HV SMART SET (Cement) ×4 IMPLANT
COVER SURGICAL LIGHT HANDLE (MISCELLANEOUS) ×2 IMPLANT
CUFF TOURNIQUET SINGLE 34IN LL (TOURNIQUET CUFF) ×2 IMPLANT
CUFF TOURNIQUET SINGLE 44IN (TOURNIQUET CUFF) IMPLANT
DRAPE EXTREMITY T 121X128X90 (DRAPE) ×2 IMPLANT
DRAPE IMP U-DRAPE 54X76 (DRAPES) ×2 IMPLANT
DRAPE INCISE IOBAN 66X45 STRL (DRAPES) ×1 IMPLANT
DRAPE PROXIMA HALF (DRAPES) ×2 IMPLANT
DRAPE U-SHAPE 47X51 STRL (DRAPES) ×2 IMPLANT
DRSG AQUACEL AG ADV 3.5X14 (GAUZE/BANDAGES/DRESSINGS) ×2 IMPLANT
DRSG PAD ABDOMINAL 8X10 ST (GAUZE/BANDAGES/DRESSINGS) ×4 IMPLANT
DURAPREP 26ML APPLICATOR (WOUND CARE) ×4 IMPLANT
ELECT CAUTERY BLADE 6.4 (BLADE) ×2 IMPLANT
ELECT REM PT RETURN 9FT ADLT (ELECTROSURGICAL) ×2
ELECTRODE REM PT RTRN 9FT ADLT (ELECTROSURGICAL) ×1 IMPLANT
EVACUATOR 1/8 PVC DRAIN (DRAIN) ×2 IMPLANT
FACESHIELD WRAPAROUND (MASK) ×4 IMPLANT
FACESHIELD WRAPAROUND OR TEAM (MASK) ×1 IMPLANT
GAUZE SPONGE 4X4 12PLY STRL (GAUZE/BANDAGES/DRESSINGS) ×2 IMPLANT
GLOVE BIO SURGEON STRL SZ7 (GLOVE) ×2 IMPLANT
GLOVE BIOGEL PI IND STRL 7.0 (GLOVE) ×1 IMPLANT
GLOVE BIOGEL PI IND STRL 7.5 (GLOVE) ×1 IMPLANT
GLOVE BIOGEL PI INDICATOR 7.0 (GLOVE) ×1
GLOVE BIOGEL PI INDICATOR 7.5 (GLOVE) ×1
GLOVE SS BIOGEL STRL SZ 7.5 (GLOVE) ×1 IMPLANT
GLOVE SUPERSENSE BIOGEL SZ 7.5 (GLOVE) ×1
GOWN STRL REUS W/ TWL LRG LVL3 (GOWN DISPOSABLE) ×2 IMPLANT
GOWN STRL REUS W/ TWL XL LVL3 (GOWN DISPOSABLE) ×2 IMPLANT
GOWN STRL REUS W/TWL LRG LVL3 (GOWN DISPOSABLE) ×4
GOWN STRL REUS W/TWL XL LVL3 (GOWN DISPOSABLE) ×4
HANDPIECE INTERPULSE COAX TIP (DISPOSABLE) ×2
HOOD PEEL AWAY FACE SHEILD DIS (HOOD) ×4 IMPLANT
IMMOBILIZER KNEE 22 UNIV (SOFTGOODS) ×1 IMPLANT
KIT BASIN OR (CUSTOM PROCEDURE TRAY) ×2 IMPLANT
KIT ROOM TURNOVER OR (KITS) ×2 IMPLANT
MANIFOLD NEPTUNE II (INSTRUMENTS) ×2 IMPLANT
MARKER SKIN DUAL TIP RULER LAB (MISCELLANEOUS) ×2 IMPLANT
NS IRRIG 1000ML POUR BTL (IV SOLUTION) ×2 IMPLANT
PACK TOTAL JOINT (CUSTOM PROCEDURE TRAY) ×2 IMPLANT
PACK UNIVERSAL I (CUSTOM PROCEDURE TRAY) ×2 IMPLANT
PAD ARMBOARD 7.5X6 YLW CONV (MISCELLANEOUS) ×4 IMPLANT
PAD CAST 4YDX4 CTTN HI CHSV (CAST SUPPLIES) IMPLANT
PADDING CAST COTTON 4X4 STRL (CAST SUPPLIES) ×2
PADDING CAST COTTON 6X4 STRL (CAST SUPPLIES) ×2 IMPLANT
RUBBERBAND STERILE (MISCELLANEOUS) ×2 IMPLANT
SET HNDPC FAN SPRY TIP SCT (DISPOSABLE) ×1 IMPLANT
STRIP CLOSURE SKIN 1/2X4 (GAUZE/BANDAGES/DRESSINGS) ×2 IMPLANT
SUCTION FRAZIER TIP 10 FR DISP (SUCTIONS) ×2 IMPLANT
SUT ETHIBOND NAB CT1 #1 30IN (SUTURE) ×3 IMPLANT
SUT MNCRL AB 3-0 PS2 18 (SUTURE) ×2 IMPLANT
SUT VIC AB 0 CT1 27 (SUTURE) ×4
SUT VIC AB 0 CT1 27XBRD ANBCTR (SUTURE) ×2 IMPLANT
SUT VIC AB 2-0 CT1 27 (SUTURE) ×4
SUT VIC AB 2-0 CT1 TAPERPNT 27 (SUTURE) ×2 IMPLANT
SYR 30ML SLIP (SYRINGE) ×2 IMPLANT
TOWEL OR 17X24 6PK STRL BLUE (TOWEL DISPOSABLE) ×2 IMPLANT
TOWEL OR 17X26 10 PK STRL BLUE (TOWEL DISPOSABLE) ×2 IMPLANT
TRAY FOLEY CATH 16FR SILVER (SET/KITS/TRAYS/PACK) ×2 IMPLANT
WATER STERILE IRR 1000ML POUR (IV SOLUTION) ×2 IMPLANT

## 2014-09-17 NOTE — Anesthesia Postprocedure Evaluation (Signed)
  Anesthesia Post-op Note  Patient: [name]  Procedure(s) Performed: Procedure(s) with comments: TOTAL KNEE ARTHROPLASTY (Left) - pt has ICD  Patient Location: PACU  Anesthesia Type:Spinal  Level of Consciousness: awake, alert  and oriented  Airway and Oxygen Therapy: Patient Spontanous Breathing  Post-op [name]: none  Post-op Assessment: Post-op Vital signs reviewed, Patient's Cardiovascular Status Stable and Respiratory Function Stable  Post-op Vital Signs: Reviewed and stable  Last Vitals:  Filed Vitals:   09/17/14 0930  BP: 120/68  Pulse: 75  Temp:   Resp: 10    Complications: No apparent anesthesia complications

## 2014-09-17 NOTE — Transfer of Care (Signed)
Immediate Anesthesia Transfer of Care Note  Patient: [name]  Procedure(s) Performed: Procedure(s) with comments: TOTAL KNEE ARTHROPLASTY (Left) - pt has ICD  Patient Location: PACU  Anesthesia Type:Spinal  Level of Consciousness: awake, alert , oriented and patient cooperative  Airway & Oxygen Therapy: Patient Spontanous Breathing and Patient connected to nasal cannula oxygen  Post-op Assessment: Report given to RN, Post -op Vital signs reviewed and stable and Patient moving all extremities  Post vital signs: Reviewed and stable  Last Vitals:  Filed Vitals:   09/17/14 0547  BP: 137/71  Pulse: 83  Temp: 36.7 C  Resp: 20    Complications: No apparent anesthesia complications

## 2014-09-17 NOTE — Progress Notes (Signed)
Orthopedic Tech Progress Note Patient Details:  [name] 04-Oct-1950 [id number]  CPM Left Knee CPM Left Knee: On Left Knee Flexion (Degrees): 90 Left Knee Extension (Degrees): 0 Additional Comments: trapeze bar patient helper Viewed order from doctor's order list  [name] 09/17/2014, 10:08 AM

## 2014-09-17 NOTE — Progress Notes (Signed)
Patient arrived to room from PACU alert and oriented. Family at bedside. Safety precautions and orders reviewed with patient/family. Will continue to monitor.   Sim Boast, RN

## 2014-09-17 NOTE — Progress Notes (Signed)
ANTICOAGULATION CONSULT NOTE - Initial Consult  Pharmacy Consult for Warfarin  Indication: atrial fibrillation, dvt prop s/p total knee replacement  Allergies  Allergen Reactions  . Avelox [Moxifloxacin Hcl In Nacl]     Cardiac arrest per pt  . Simvastatin Other (See Comments)    myalgias  . Ace Inhibitors Cough    Dry cough   . Latex Rash    Patient Measurements: Height: 5\' 6"  (167.6 cm) Weight: 200 lb (90.719 kg) IBW/kg (Calculated) : 59.3  Vital Signs: Temp: 97.6 F (36.4 C) (06/06 1055) Temp Source: Oral (06/06 1055) BP: 122/60 mmHg (06/06 1055) Pulse Rate: 69 (06/06 1055)  Labs:  Recent Labs  09/17/14 0611  APTT 26  LABPROT 15.8*  INR 1.24    Estimated Creatinine Clearance: 70.3 mL/min (by C-G formula based on Cr of 0.93).   Medical History: Past Medical History  Diagnosis Date  . Atrial fibrillation     ablation x 2 [facility], 01/2006, 2011.  on warfarin  . Hyperlipidemia   . Depression   . Anxiety   . Anemia   . Arthritis   . GERD (gastroesophageal reflux disease)   . Vocal cord disease   . Cardiac arrest jan 2012    in hospital for pneumonia when this occured- occured at the hospital  . GASTROESOPHAGEAL REFLUX, NO ESOPHAGITIS 06/10/2006    Qualifier: Diagnosis of  By: Abundio Miu    . Non-ischemic cardiomyopathy     echo 08/08/10 - EF 35-45% LV and RV systolic function mod reduced  . Limb pain 06/04/2008    LLE, Baker's cyst in popliteal fossa, no DVT  . Primary localized osteoarthritis of left knee   . History of hiatal hernia   . CHF (congestive heart failure) 2012    Echo 08/08/10 by [facility]. EF 35-45%. LV systolic function moderately reduced. Moderate global hypokinesis of LV.  RV systolic function moderately reduced. Mild MR. Trace AR.  Marland Kitchen AICD (automatic cardioverter/defibrillator) present     Medtronic  . Asthma     has had multiple hospitalizations for this  . Complication of anesthesia     difficult time waking up after  anesthesia  . Hypertension   . Dysrhythmia     Atrial fibrillation  . Diabetes mellitus     Type 2  . Sleep apnea     wears CPAP nightly    Medications:  Prescriptions prior to admission  Medication Sig Dispense Refill Last Dose  . B-D ULTRAFINE III SHORT PEN 31G X 8 MM MISC use as directed 100 each 9 Taking  . budesonide (PULMICORT FLEXHALER) 180 MCG/ACT inhaler Inhale 2 puffs into the lungs 2 (two) times daily. 1 each 11 09/16/2014 at Unknown time  . buPROPion (WELLBUTRIN XL) 150 MG 24 hr tablet take 1 tablet by mouth once daily (Patient taking differently: Take 150 mg by mouth daily. take 1 tablet by mouth once daily) 30 tablet 8 09/17/2014 at 0400  . chlorpheniramine (CHLOR-TRIMETON) 4 MG tablet 8mg  at night (Patient taking differently: Take 8 mg by mouth at bedtime. 8mg  at night) 14 tablet 0 Past Week at Unknown time  . DIGITEK 125 MCG tablet take 1/2 tablets by mouth once daily (Patient taking differently: take 1/2 tablets by mouth once daily 0.0625 mg daily) 15 tablet 9 09/17/2014 at 0400  . dofetilide (TIKOSYN) 250 MCG capsule Take 250 mcg by mouth 2 (two) times daily.   09/17/2014 at 0400  . fluticasone (FLONASE) 50 MCG/ACT nasal spray instill 1  spray into each nostril twice a day (Patient taking differently: Place 1 spray into both nostrils daily. instill 1 spray into each nostril twice a day) 16 g 5 09/17/2014 at 0400  . insulin aspart (NOVOLOG FLEXPEN) 100 UNIT/ML FlexPen Please inject 5 units with large meal of day (Patient taking differently: Inject 5 Units into the skin See admin instructions. Please inject 5 units with large meal of day) 15 mL 11 Past Week at Unknown time  . Insulin Glargine (LANTUS) 100 UNIT/ML Solostar Pen Inject 38 Units into the skin at bedtime. (Patient taking differently: Inject 40 Units into the skin daily before breakfast. ) 15 mL 12 09/17/2014 at 0400  . levalbuterol (XOPENEX HFA) 45 MCG/ACT inhaler inhale 1 to 2 puffs by mouth INTO THE LUNGS every 4 hours if  needed for wheezing (Patient taking differently: Inhale 1-2 puffs into the lungs every 4 (four) hours as needed for wheezing. inhale 1 to 2 puffs by mouth INTO THE LUNGS every 4 hours if needed for wheezing) 15 g 4 09/16/2014 at Unknown time  . levalbuterol (XOPENEX) 0.63 MG/3ML nebulizer solution One vial in nebulizer four times daily as needed dx 493.00 (Patient taking differently: Take 0.63 mg by nebulization every 6 (six) hours as needed for wheezing or shortness of breath. One vial in nebulizer four times daily as needed dx 493.00) 360 mL 4 Past Month at Unknown time  . losartan (COZAAR) 50 MG tablet take 1 tablet by mouth once daily (Patient taking differently: Take 50 mg by mouth daily. take 1 tablet by mouth once daily) 90 tablet 3 09/17/2014 at 0400  . metoprolol (LOPRESSOR) 50 MG tablet take 1 tablet by mouth twice a day (Patient taking differently: Take 50 mg by mouth 2 (two) times daily. take 1 tablet by mouth twice a day) 60 tablet 11 09/17/2014 at 0400  . NEXIUM 40 MG capsule take 1 capsule by mouth once daily 30 capsule 6 09/17/2014 at 0400  . Omega-3 Fatty Acids (FISH OIL) 1200 MG CAPS Take 1 capsule by mouth daily.   Past Month at Unknown time  . polyethylene glycol powder (GLYCOLAX/MIRALAX) powder take 17GM (DISSOLVED IN WATER) by mouth once daily (Patient taking differently: Take 17 g by mouth daily. take 17GM (DISSOLVED IN WATER) by mouth once daily) 255 g 5 Past Week at Unknown time  . RA LORATADINE 10 MG tablet take 1 tablet by mouth once daily 30 tablet 5 09/17/2014 at 0400  . rosuvastatin (CRESTOR) 10 MG tablet Take 1 tablet (10 mg total) by mouth daily. 90 tablet 2 09/16/2014 at Unknown time  . spironolactone (ALDACTONE) 25 MG tablet Take 0.5 tablets (12.5 mg total) by mouth daily. 30 tablet 6 09/16/2014 at Unknown time  . torsemide (DEMADEX) 10 MG tablet take 2 tablets by mouth once daily or as directed 70 tablet 6 09/16/2014 at Unknown time  . warfarin (COUMADIN) 5 MG tablet take 1 and 1/2  tablets by mouth once daily (Patient taking differently: Take 1 to 1 and 1/2 tablets by mouth once daily ( 5 mg Mon, Wed, Fri, and Sat;    7.5 mg Sun, Tues, Thurs)) 45 tablet 5 Past Week at Unknown time  . HYDROcodone-homatropine (HYDROMET) 5-1.5 MG/5ML syrup Take 5 mLs by mouth every 6 (six) hours as needed for cough. 240 mL 0 More than a month at Unknown time  . Insulin Pen Needle (B-D ULTRAFINE III SHORT PEN) 31G X 8 MM MISC Use as directed 100 each 9  Assessment: CC: left knee pain admitted for total knee replacement  PMH: arthritis, T2DM, HLD, HTN, A Fib, ICD  AC: afib PTA, not s/p total knee replacement. Home warfarin was 5 mg MWFSat, 7.5 mg T/T/Sun  Current INR is 1.25, on enoxaparin 30 mg BID with plans to restart warfarin tonight.  Goal of Therapy:  INR 2-3 Monitor platelets by anticoagulation protocol: Yes   Plan:  -Warfarin 5 mg x 1 tonight -D/C Enoxaparin when appropriate -Daily INR -Monitor for s/sx bleeding  Isaac Bliss, PharmD, BCPS Clinical Pharmacist Pager [phone] 09/17/2014 11:34 AM

## 2014-09-17 NOTE — Discharge Instructions (Signed)

## 2014-09-17 NOTE — Op Note (Signed)
MRN:     [id number] DOB/AGE:    [birth year] / 64 y.o.       OPERATIVE REPORT    DATE OF PROCEDURE:  09/17/2014       PREOPERATIVE DIAGNOSIS:   Primary localized OA left knee      Estimated body mass index is 32.3 kg/(m^2) as calculated from the following:   Height as of this encounter:  (1.676 m).   Weight as of this encounter: 90.719 kg (200 lb).                                                        POSTOPERATIVE DIAGNOSIS:   same                                                                      PROCEDURE:  Procedure(s): TOTAL KNEE ARTHROPLASTY Using Depuy Attune RP implants #7 Femur, #6Tibia, 5mm sigma RP bearing, 35 Patella     SURGEON: Jaelen Gellerman A    ASSISTANT:  Kirstin Shepperson PA-C   (Present and scrubbed throughout the case, critical for assistance with exposure, retraction, instrumentation, and closure.)         ANESTHESIA: Spinal with Femoral Nerve Block  DRAINS: foley, 2 medium hemovac in knee   TOURNIQUET TIME:   COMPLICATIONS:  None     SPECIMENS: None   INDICATIONS FOR PROCEDURE: The patient has  djd left knee, varus deformities, XR shows bone on bone arthritis. Patient has failed all conservative measures including anti-inflammatory medicines, narcotics, attempts at  exercise and weight loss, cortisone injections and viscosupplementation.  Risks and benefits of surgery have been discussed, questions answered.   DESCRIPTION OF PROCEDURE: The patient identified by armband, received  right femoral nerve block and IV antibiotics, in the holding area at [facility]. Patient taken to the operating room, appropriate anesthetic  monitors were attached General endotracheal anesthesia induced with  the patient in supine position, Foley catheter was inserted. Tourniquet  applied high to the operative thigh. Lateral post and foot positioner  applied to the table, the lower extremity was then prepped and draped  in usual sterile fashion from the ankle to  the tourniquet. Time-out procedure was performed. The limb was wrapped with an Esmarch bandage and the tourniquet inflated to 365 mmHg. We began the operation by making the anterior midline incision starting at handbreadth above the patella going over the patella 1 cm medial to and  4 cm distal to the tibial tubercle. Small bleeders in the skin and the  subcutaneous tissue identified and cauterized. Transverse retinaculum was incised and reflected medially and a medial parapatellar arthrotomy was accomplished. the patella was everted and theprepatellar fat pad resected. The superficial medial collateral  ligament was then elevated from anterior to posterior along the proximal  flare of the tibia and anterior half of the menisci resected. The knee was hyperflexed exposing bone on bone arthritis. Peripheral and notch osteophytes as well as the cruciate ligaments were then resected. We continued to  work our way around posteriorly along the proximal tibia, and externally  rotated the tibia subluxing it out from underneath the femur. A McHale  retractor was placed through the notch and a lateral Hohmann retractor  placed, and we then drilled through the proximal tibia in line with the  axis of the tibia followed by an intramedullary guide rod and 2-degree  posterior slope cutting guide. The tibial cutting guide was pinned into place  allowing resection of 4 mm of bone medially and about 6 mm of bone  laterally because of her varus deformity. Satisfied with the tibial resection, we then  entered the distal femur 2 mm anterior to the PCL origin with the  intramedullary guide rod and applied the distal femoral cutting guide  set at 46mm, with 5 degrees of valgus. This was pinned along the  epicondylar axis. At this point, the distal femoral cut was accomplished without difficulty. We then sized for a #7 femoral component and pinned the guide in 3 degrees of external rotation.The chamfer cutting guide was  pinned into place. The anterior, posterior, and chamfer cuts were accomplished without difficulty followed by  the Attune RP box cutting guide and the box cut. We also removed posterior osteophytes from the posterior femoral condyles. At this  time, the knee was brought into full extension. We checked our  extension and flexion gaps and found them symmetric at 90mm.  The patella thickness measured at 23 mm. We set the cutting guide at 15 and removed the posterior 8 mm  of the patella sized for 35 button and drilled the lollipop. The knee  was then once again hyperflexed exposing the proximal tibia. We sized for a #6 tibial base plate, applied the smokestack and the conical reamer followed by the the Delta fin keel punch. We then hammered into place the Attune RP trial femoral component, inserted a 5-mm trial bearing, trial patellar button, and took the knee through range of motion from 0-130 degrees. No thumb pressure was required for patellar  tracking. At this point, all trial components were removed, a double batch of DePuy HV cement with 1500 mg of Zinacef was mixed and applied to all bony metallic mating surfaces except for the posterior condyles of the femur itself. In order, we  hammered into place the tibial tray and removed excess cement, the femoral component and removed excess cement, a 5-mm Attune RP bearing  was inserted, and the knee brought to full extension with compression.  The patellar button was clamped into place, and excess cement  removed. While the cement cured the wound was irrigated out with normal saline solution pulse lavage, and medium Hemovac drains were placed.. Ligament stability and patellar tracking were checked and found to be excellent. The tourniquet was then released and hemostasis was obtained with cautery. The parapatellar arthrotomy was closed with  #1 ethibond suture. The subcutaneous tissue with 0 and 2-0 undyed  Vicryl suture, and 4-0 Monocryl.. A dressing of  Xeroform,  4 x 4, dressing sponges, Webril, and Ace wrap applied. Needle and sponge count were correct times 2.The patient awakened, extubated, and taken to recovery room without difficulty. Vascular status was normal, pulses 2+ and symmetric.   Areil Ottey A 09/17/2014, 8:51 AM

## 2014-09-17 NOTE — Anesthesia Procedure Notes (Signed)
Anesthesia Regional Block:  Adductor canal block  Pre-Anesthetic Checklist: ,, timeout performed, Correct Patient, Correct Site, Correct Laterality, Correct Procedure, Correct Position, site marked, Risks and benefits discussed,  Surgical consent,  Pre-op evaluation,  At surgeon's request and post-op pain management  Laterality: Left  Prep: chloraprep       Needles:  Injection technique: Single-shot  Needle Type: Echogenic Needle     Needle Length: 9cm 9 cm Needle Gauge: 21 and 21 G    Additional Needles:  Procedures: ultrasound guided (picture in chart) Adductor canal block Narrative:  Start time: 09/17/2014 6:55 AM End time: 09/17/2014 7:05 AM Injection made incrementally with aspirations every 5 mL.  Performed by: Personally  Anesthesiologist: Suzette Battiest  Additional Notes: Risks and benefits discussed. Pt tolerated well with no immediate complications,.   Spinal Patient location during procedure: OR Staffing Anesthesiologist: Suzette Battiest Performed by: anesthesiologist  Preanesthetic Checklist Completed: patient identified, site marked, surgical consent, pre-op evaluation, timeout performed, IV checked, risks and benefits discussed and monitors and equipment checked Spinal Block Patient position: sitting Prep: Betadine Patient monitoring: heart rate, continuous pulse ox and blood pressure Approach: midline Location: L4-5 Injection technique: single-shot Needle Needle type: Spinocan  Needle gauge: 24 G Needle length: 9 cm Additional Notes Expiration date of kit checked and confirmed. Patient tolerated procedure well, without complications.

## 2014-09-17 NOTE — Evaluation (Signed)
Physical Therapy Evaluation Patient Details Name: [name] MRN: [id number] DOB: July 11, 1950 Today's Date: 09/17/2014   History of Present Illness  Pt is a 64 y/o F s/p L TKA. PT's PMH includes heel pain, obesity, CKD, asthma, trochanteric busitis of R hip, diabetic neuropathy, CHF, HTN, a fib, depression, anxiety, DM, cardiac arrest.  Clinical Impression  Pt is s/p L TKA resulting in the deficits listed below (see PT Problem List). Pt demonstrated ability to ambulate 15 ft this session before sitting 2/2 dizziness.  Pt did not demonstrate extension lag w/ SLR so L KI was not used this session. Pt will benefit from skilled PT to increase their independence and safety with mobility to allow discharge to the venue listed below.     Follow Up Recommendations Home health PT;Supervision - Intermittent    Equipment Recommendations  None recommended by PT    Recommendations for Other Services OT consult     Precautions / Restrictions Precautions Precautions: Fall;Knee Precaution Booklet Issued: Yes (comment) Precaution Comments: Reviewed no pillow under knee Restrictions Weight Bearing Restrictions: Yes LLE Weight Bearing: Weight bearing as tolerated      Mobility  Bed Mobility Overal bed mobility: Needs Assistance Bed Mobility: Supine to Sit     Supine to sit: Supervision     General bed mobility comments: Verbal cues for proper sequencing.  Pt w/ use of bed rails.  Transfers Overall transfer level: Needs assistance Equipment used: Rolling walker (2 wheeled) Transfers: Sit to/from Stand Sit to Stand: Min guard         General transfer comment: Cues for hand placement.  KI not applied prior to ambulation 2/2 pt able to perform SLR w/ no E lag  Ambulation/Gait Ambulation/Gait assistance: Min guard Ambulation Distance (Feet): 15 Feet Assistive device: Rolling walker (2 wheeled) Gait Pattern/deviations: Step-to pattern;Antalgic;Decreased stride length;Decreased weight  shift to left;Decreased stance time - left;Trunk flexed   Gait velocity interpretation: Below normal speed for age/gender General Gait Details: Step to pattern, inc WB through BUEs, pt reports dizziness after ambulating 15 ft and pt instructed to sit in recliner chair.  Stairs            Wheelchair Mobility    Modified Rankin (Stroke Patients Only)       Balance Overall balance assessment: Needs assistance Sitting-balance support: Bilateral upper extremity supported;Feet supported Sitting balance-Leahy Scale: Good     Standing balance support: Bilateral upper extremity supported;During functional activity Standing balance-Leahy Scale: Fair                               Pertinent Vitals/Pain Pain Assessment: 0-10 Pain Score: 5  Pain Location: L knee Pain Descriptors / Indicators: Aching;Grimacing;Guarding Pain Intervention(s): Limited activity within patient's tolerance;Monitored during session;Repositioned    Home Living Family/patient expects to be discharged to:: Private residence Living Arrangements: Children (daughter) Available Help at Discharge: Family;Available 24 hours/day;Friend(s) Type of Home: House (Townhouse) Home Access: Stairs to enter Entrance Stairs-Rails: None Entrance Stairs-Number of Steps: 1 Home Layout: Two level;Able to live on main level with bedroom/bathroom Home Equipment: Dan Humphreys - 2 wheels      Prior Function Level of Independence: Independent               Hand Dominance   Dominant Hand: Right    Extremity/Trunk Assessment   Upper Extremity Assessment: Defer to OT evaluation           Lower Extremity Assessment: LLE  deficits/detail   LLE Deficits / Details: weakness and limited ROM as expected s/p L TKA     Communication   Communication: No difficulties  Cognition Arousal/Alertness: Awake/alert Behavior During Therapy: WFL for tasks assessed/performed Overall Cognitive Status: Within Functional  Limits for tasks assessed                      General Comments      Exercises Total Joint Exercises Ankle Circles/Pumps: AROM;Both;15 reps;Supine Quad Sets: AROM;Both;5 reps;Supine Heel Slides: AROM;Left;5 reps;Supine Straight Leg Raises: AROM;Left;5 reps;Supine Knee Flexion: AROM;Left;5 reps;Seated Goniometric ROM: 22-80      Assessment/Plan    PT Assessment Patient needs continued PT services  PT Diagnosis Difficulty walking;Abnormality of gait;Generalized weakness;Acute pain   PT Problem List Decreased strength;Decreased range of motion;Decreased activity tolerance;Decreased balance;Decreased mobility;Decreased coordination;Decreased knowledge of use of DME;Decreased safety awareness;Decreased knowledge of precautions;Decreased skin integrity;Pain  PT Treatment Interventions DME instruction;Gait training;Stair training;Functional mobility training;Therapeutic activities;Therapeutic exercise;Balance training;Neuromuscular re-education;Patient/family education;Modalities   PT Goals (Current goals can be found in the Care Plan section) Acute Rehab PT Goals Patient Stated Goal: to get stronger and get back to walking PT Goal Formulation: With patient Time For Goal Achievement: 09/24/14 Potential to Achieve Goals: Good    Frequency 7X/week   Barriers to discharge Inaccessible home environment 1 step to enter home    Co-evaluation               End of Session Equipment Utilized During Treatment: Gait belt Activity Tolerance: Patient tolerated treatment well Patient left: in chair;with call bell/phone within reach;with family/visitor present Nurse Communication: Mobility status;Precautions;Weight bearing status         Time: 1502-1529 PT Time Calculation (min) (ACUTE ONLY): 27 min   Charges:   PT Evaluation $Initial PT Evaluation Tier I: 1 Procedure PT Treatments $Therapeutic Exercise: 8-22 mins   PT G Codes:       Michail Jewels PT, DPT  [phone] Pager: [phone] 09/17/2014, 4:42 PM

## 2014-09-17 NOTE — Interval H&P Note (Signed)
History and Physical Interval Note:  09/17/2014 7:03 AM  [name]  has presented today for surgery, with the diagnosis of primary localized OA left knee  The various methods of treatment have been discussed with the patient and family. After consideration of risks, benefits and other options for treatment, the patient has consented to  Procedure(s) with comments: TOTAL KNEE ARTHROPLASTY (Left) - pt has ICD as a surgical intervention .  The patient's history has been reviewed, patient examined, no change in status, stable for surgery.  I have reviewed the patient's chart and labs.  Questions were answered to the patient's satisfaction.     Salvatore Marvel A

## 2014-09-17 NOTE — Progress Notes (Signed)
Placed patient on CPAP for the night with minimum pressure set at 5cm and maximum pressure set at 20cm

## 2014-09-17 NOTE — Anesthesia Preprocedure Evaluation (Addendum)
Anesthesia Evaluation  Patient identified by MRN, date of birth, ID band Patient awake    Reviewed: Allergy & Precautions, NPO status , Patient's Chart, lab work & pertinent test results, reviewed documented beta blocker date and time   Airway Mallampati: II  TM Distance: >3 FB Neck ROM: full    Dental   Pulmonary asthma , sleep apnea ,  breath sounds clear to auscultation        Cardiovascular hypertension, Pt. on medications and Pt. on home beta blockers +CHF + dysrhythmias + Cardiac Defibrillator Rhythm:Irregular Rate:Normal     Neuro/Psych Anxiety Depression negative neurological ROS     GI/Hepatic Neg liver ROS, hiatal hernia, GERD-  ,  Endo/Other  diabetes, Type 2, Insulin DependentMorbid obesity  Renal/GU CRFRenal disease     Musculoskeletal  (+) Arthritis -,   Abdominal   Peds  Hematology  (+) anemia ,   Anesthesia Other Findings   Reproductive/Obstetrics                           Lab Results  Component Value Date   WBC 4.1 09/07/2014   HGB 11.7* 09/07/2014   HCT 36.7 09/07/2014   MCV 88.4 09/07/2014   PLT 215 09/07/2014   Lab Results  Component Value Date   CREATININE 0.93 09/07/2014   BUN 17 09/07/2014   NA 135 09/07/2014   K 4.3 09/07/2014   CL 103 09/07/2014   CO2 26 09/07/2014    Anesthesia Physical Anesthesia Plan  ASA: III  Anesthesia Plan: Regional and Spinal   Post-op Pain Management: MAC Combined w/ Regional for Post-op pain   Induction: Intravenous  Airway Management Planned: Simple Face Mask and Natural Airway  Additional Equipment:   Intra-op Plan:   Post-operative Plan:   Informed Consent: I have reviewed the patients History and Physical, chart, labs and discussed the procedure including the risks, benefits and alternatives for the proposed anesthesia with the patient or authorized representative who has indicated his/her understanding and  acceptance.     Plan Discussed with: CRNA, Anesthesiologist and Surgeon  Anesthesia Plan Comments:       Anesthesia Quick Evaluation

## 2014-09-18 ENCOUNTER — Encounter (HOSPITAL_COMMUNITY): Payer: Self-pay | Admitting: General Practice

## 2014-09-18 LAB — PROTIME-INR
INR: 1.4 (ref 0.00–1.49)
PROTHROMBIN TIME: 17.2 s — AB (ref 11.6–15.2)

## 2014-09-18 LAB — BASIC METABOLIC PANEL
ANION GAP: 7 (ref 5–15)
BUN: 13 mg/dL (ref 6–20)
CALCIUM: 8.3 mg/dL — AB (ref 8.9–10.3)
CO2: 26 mmol/L (ref 22–32)
Chloride: 102 mmol/L (ref 101–111)
Creatinine, Ser: 0.92 mg/dL (ref 0.44–1.00)
Glucose, Bld: 319 mg/dL — ABNORMAL HIGH (ref 65–99)
Potassium: 4.5 mmol/L (ref 3.5–5.1)
Sodium: 135 mmol/L (ref 135–145)

## 2014-09-18 LAB — URINE CULTURE
CULTURE: NO GROWTH
Colony Count: NO GROWTH

## 2014-09-18 LAB — CBC
HCT: 28.4 % — ABNORMAL LOW (ref 36.0–46.0)
Hemoglobin: 9.3 g/dL — ABNORMAL LOW (ref 12.0–15.0)
MCH: 28.8 pg (ref 26.0–34.0)
MCHC: 32.7 g/dL (ref 30.0–36.0)
MCV: 87.9 fL (ref 78.0–100.0)
PLATELETS: 196 10*3/uL (ref 150–400)
RBC: 3.23 MIL/uL — AB (ref 3.87–5.11)
RDW: 13.3 % (ref 11.5–15.5)
WBC: 10.9 10*3/uL — ABNORMAL HIGH (ref 4.0–10.5)

## 2014-09-18 LAB — GLUCOSE, CAPILLARY
Glucose-Capillary: 316 mg/dL — ABNORMAL HIGH (ref 65–99)
Glucose-Capillary: 309 mg/dL — ABNORMAL HIGH (ref 65–99)
Glucose-Capillary: 244 mg/dL — ABNORMAL HIGH (ref 65–99)
Glucose-Capillary: 189 mg/dL — ABNORMAL HIGH (ref 65–99)
Glucose-Capillary: 254 mg/dL — ABNORMAL HIGH (ref 65–99)

## 2014-09-18 MED ORDER — CELECOXIB 200 MG PO CAPS
200.0000 mg | ORAL_CAPSULE | Freq: Every day | ORAL | Status: DC
  Administered 2014-09-18 – 2014-09-20 (×3): 200 mg via ORAL
  Filled 2014-09-18 (×3): qty 1

## 2014-09-18 MED ORDER — OXYCODONE HCL ER 10 MG PO T12A
20.0000 mg | EXTENDED_RELEASE_TABLET | Freq: Two times a day (BID) | ORAL | Status: DC
  Administered 2014-09-18 – 2014-09-20 (×5): 20 mg via ORAL
  Filled 2014-09-18 (×5): qty 2

## 2014-09-18 MED ORDER — WARFARIN SODIUM 7.5 MG PO TABS
7.5000 mg | ORAL_TABLET | Freq: Once | ORAL | Status: AC
Start: 2014-09-18 — End: 2014-09-18
  Administered 2014-09-18: 7.5 mg via ORAL
  Filled 2014-09-18: qty 1

## 2014-09-18 MED ORDER — INSULIN GLARGINE 100 UNIT/ML ~~LOC~~ SOLN
50.0000 [IU] | Freq: Every day | SUBCUTANEOUS | Status: DC
  Administered 2014-09-18: 50 [IU] via SUBCUTANEOUS
  Filled 2014-09-18: qty 0.5

## 2014-09-18 MED ORDER — INSULIN ASPART 100 UNIT/ML ~~LOC~~ SOLN
5.0000 [IU] | Freq: Every day | SUBCUTANEOUS | Status: DC
  Administered 2014-09-18 – 2014-09-19 (×2): 5 [IU] via SUBCUTANEOUS

## 2014-09-18 NOTE — Progress Notes (Signed)
Orthopedic Tech Progress Note Patient Details:  [name] June 17, 1950 [id number] On cpm at 6:00 pm Patient ID: [name], female   DOB: January 30, 1951, 63 y.o.   MRN: [id number]   [name] 09/18/2014, 8:07 PM

## 2014-09-18 NOTE — Progress Notes (Signed)
Occupational Therapy Evaluation Patient Details Name: [name] MRN: [id number] DOB: [birth year] Today's Date: 09/18/2014    History of Present Illness Pt is a 64 y/o F s/p L TKA. PT's PMH includes heel pain, obesity, CKD, asthma, trochanteric busitis of R hip, diabetic neuropathy, CHF, HTN, a fib, depression, anxiety, DM, cardiac arrest.   Clinical Impression   Pt admitted with the above diagnoses and presents with below problem list. Pt will benefit from continued acute OT to address the below listed deficits and maximize independence with BADLs. PTA was independent with ADLs. Pt currently at min guard level with LB ADLs, transfers, and functional mobility. ADLs completed and education provided as detailed below.      Follow Up Recommendations  Supervision/Assistance - 24 hour;No OT follow up    Equipment Recommendations  None recommended by OT    Recommendations for Other Services       Precautions / Restrictions Precautions Precautions: Fall;Knee Precaution Comments: reviewed Restrictions Weight Bearing Restrictions: Yes LLE Weight Bearing: Weight bearing as tolerated      Mobility Bed Mobility               General bed mobility comments: in recliner  Transfers Overall transfer level: Needs assistance Equipment used: Rolling walker (2 wheeled) Transfers: Sit to/from Stand Sit to Stand: Min guard         General transfer comment: from recliner and 3n1, good hand placement; cued to bring rw close prior to sitting.     Balance Overall balance assessment: Needs assistance Sitting-balance support: Feet supported Sitting balance-Leahy Scale: Good     Standing balance support: During functional activity;Bilateral upper extremity supported Standing balance-Leahy Scale: Fair                              ADL Overall ADL's : Needs assistance/impaired Eating/Feeding: Set up;Sitting   Grooming: Set up;Sitting   Upper Body Bathing: Set  up;Sitting   Lower Body Bathing: Min guard;Sit to/from stand Lower Body Bathing Details (indicate cue type and reason): able to reach straight down Lt leg to access feet Upper Body Dressing : Set up;Sitting   Lower Body Dressing: Min guard;Sit to/from stand Lower Body Dressing Details (indicate cue type and reason): able to reach straight down Lt leg to access feet Toilet Transfer: Min guard;Ambulation;RW (3n1 over toilet)   Toileting- Clothing Manipulation and Hygiene: Min guard;Sit to/from stand   Tub/ Shower Transfer: Min guard;Ambulation;3 in 1;Rolling walker   Functional mobility during ADLs: Min guard;Rolling walker General ADL Comments: Pt ambulated from recliner to bathroom and completed toiler transfer with 3n1 over toilet, all at min guard. Cues for technique with turns using rw. Pt moves at a slow, deliberate pace. ADL education provided.      Vision     Perception     Praxis      Pertinent Vitals/Pain Pain Assessment: 0-10 Pain Score: 7  Pain Location: Lt knee Pain Descriptors / Indicators: Aching;Sore Pain Intervention(s): Limited activity within patient's tolerance;Monitored during session;Repositioned;RN gave pain meds during session     Hand Dominance Right   Extremity/Trunk Assessment Upper Extremity Assessment Upper Extremity Assessment: Overall WFL for tasks assessed   Lower Extremity Assessment Lower Extremity Assessment: Defer to PT evaluation       Communication Communication Communication: No difficulties   Cognition Arousal/Alertness: Awake/alert Behavior During Therapy: WFL for tasks assessed/performed Overall Cognitive Status: Within Functional Limits for tasks assessed  General Comments       Exercises       Shoulder Instructions      Home Living Family/patient expects to be discharged to:: Private residence Living Arrangements: Children Available Help at Discharge: Family;Available 24  hours/day;Friend(s) Type of Home: House Home Access: Stairs to enter [employer] of Steps: 1 Entrance Stairs-Rails: None Home Layout: Two level;Able to live on main level with bedroom/bathroom;Other (Comment) (shower is up full flight of stairs; plans to sponge bath )     Bathroom Shower/Tub: Tub/shower unit   Bathroom Toilet: Standard Bathroom Accessibility: Yes How Accessible: Accessible via walker Home Equipment: Walker - 2 wheels;Bedside commode;Shower seat;Adaptive equipment Adaptive Equipment: Reacher        Prior Functioning/Environment Level of Independence: Independent             OT Diagnosis: Acute pain   OT Problem List: Impaired balance (sitting and/or standing);Decreased knowledge of use of DME or AE;Decreased knowledge of precautions;Pain   OT Treatment/Interventions: Self-care/ADL training;DME and/or AE instruction;Therapeutic activities;Patient/family education;Balance training    OT Goals(Current goals can be found in the care plan section) Acute Rehab OT Goals Patient Stated Goal: to get stronger and get back to walking OT Goal Formulation: With patient Time For Goal Achievement: 09/25/14 Potential to Achieve Goals: Good ADL Goals Pt Will Perform Lower Body Dressing: with modified independence;with adaptive equipment;sit to/from stand Pt Will Transfer to Toilet: with modified independence;ambulating (3n1 over toilet) Pt Will Perform Tub/Shower Transfer: Tub transfer;ambulating;shower seat;rolling walker  OT Frequency: Min 2X/week   Barriers to D/C:            Co-evaluation              End of Session Equipment Utilized During Treatment: Gait belt;Rolling walker CPM Left Knee Additional Comments: zero knee on at start and end of session; pt reports in zero knee for about 2 hours this morning already Nurse Communication: Other (comment) (pt c/o nausea)  Activity Tolerance: Patient tolerated treatment well Patient left: in  chair;with call bell/phone within reach;Other (comment) (in zero knee)   Time: 1610-9604 OT Time Calculation (min): 25 min Charges:  OT General Charges $OT Visit: 1 Procedure OT Evaluation $Initial OT Evaluation Tier I: 1 Procedure OT Treatments $Self Care/Home Management : 8-22 mins G-Codes:    Pilar Grammes 10-09-2014, 10:17 AM

## 2014-09-18 NOTE — Progress Notes (Signed)
Physical Therapy Treatment Patient Details Name: [name] MRN: [id number] DOB: [birth year] Today's Date: 09/18/2014    History of Present Illness Pt is a 64 y/o F s/p L TKA. PT's PMH includes heel pain, obesity, CKD, asthma, trochanteric busitis of R hip, diabetic neuropathy, CHF, HTN, a fib, depression, anxiety, DM, cardiac arrest.    PT Comments    Patient is making good progress with PT.  From a mobility standpoint anticipate patient will be ready for DC home tomorrow.  Pt demonstrated ability to ambulate 120 ft and ascend/descend 1 step this session. Pt will benefit from continued skilled PT services to increase functional independence and safety.   Follow Up Recommendations  Home health PT;Supervision - Intermittent     Equipment Recommendations  None recommended by PT    Recommendations for Other Services       Precautions / Restrictions Precautions Precautions: Fall;Knee Precaution Comments: reviewed Restrictions Weight Bearing Restrictions: Yes LLE Weight Bearing: Weight bearing as tolerated    Mobility  Bed Mobility               General bed mobility comments: in recliner  Transfers Overall transfer level: Needs assistance Equipment used: Rolling walker (2 wheeled) Transfers: Sit to/from Stand Sit to Stand: Supervision         General transfer comment: Min cues for hand placement  Ambulation/Gait Ambulation/Gait assistance: Supervision Ambulation Distance (Feet): 120 Feet Assistive device: Rolling walker (2 wheeled) Gait Pattern/deviations: Step-through pattern;Antalgic;Decreased weight shift to left;Decreased stance time - left   Gait velocity interpretation: Below normal speed for age/gender General Gait Details: Step through pattern, pt continues to have very dec gait speed but w/ slight improvement w/ verbal cues.     Stairs Stairs: Yes Stairs assistance: Min guard Stair Management: No rails;Backwards;With walker Number of Stairs: 1  (x2) General stair comments: Demonstration and verbal cues for technique.    Wheelchair Mobility    Modified Rankin (Stroke Patients Only)       Balance Overall balance assessment: Needs assistance Sitting-balance support: Single extremity supported;Feet supported Sitting balance-Leahy Scale: Good     Standing balance support: Bilateral upper extremity supported;During functional activity Standing balance-Leahy Scale: Fair Standing balance comment: Pt able to stand w/o UEs supported eating ice                    Cognition Arousal/Alertness: Awake/alert Behavior During Therapy: WFL for tasks assessed/performed Overall Cognitive Status: Within Functional Limits for tasks assessed                      Exercises Total Joint Exercises Long Arc Quad: AROM;Left;10 reps;Seated Knee Flexion: AROM;Left;5 reps;Seated Goniometric ROM: 10-95    General Comments General comments (skin integrity, edema, etc.): c/o nausea, RN notified      Pertinent Vitals/Pain Pain Assessment: 0-10 Pain Score: 7  Pain Location: L knee Pain Descriptors / Indicators: Aching Pain Intervention(s): Limited activity within patient's tolerance;Monitored during session;Repositioned    Home Living Family/patient expects to be discharged to:: Private residence Living Arrangements: Children                  Prior Function            PT Goals (current goals can now be found in the care plan section) Acute Rehab PT Goals Patient Stated Goal: to get stronger and get back to walking Progress towards PT goals: Progressing toward goals    Frequency  7X/week    PT  Plan Current plan remains appropriate    Co-evaluation             End of Session Equipment Utilized During Treatment: Gait belt Activity Tolerance: Patient tolerated treatment well Patient left: in chair;with call bell/phone within reach     Time: 2956-2130 PT Time Calculation (min) (ACUTE ONLY): 33  min  Charges:  $Gait Training: 23-37 mins                    G Codes:      Michail Jewels PT, [place] [phone] Pager: [phone] 09/18/2014, 2:38 PM

## 2014-09-18 NOTE — Progress Notes (Addendum)
ANTICOAGULATION CONSULT NOTE - Follow Up Consult  Pharmacy Consult for Warfarin  Indication: atrial fibrillation, dvt prop s/p total knee replacement  Allergies  Allergen Reactions  . Avelox [Moxifloxacin Hcl In Nacl]     Cardiac arrest per pt  . Simvastatin Other (See Comments)    myalgias  . Ace Inhibitors Cough    Dry cough   . Latex Rash    Patient Measurements: Height: 5\' 6"  (167.6 cm) Weight: 200 lb (90.719 kg) IBW/kg (Calculated) : 59.3  Vital Signs: Temp: 98.2 F (36.8 C) (06/07 0530) BP: 103/56 mmHg (06/07 0530) Pulse Rate: 69 (06/07 0530)  Labs:  Recent Labs  09/17/14 0611 09/17/14 1430 09/18/14 0655  HGB  --  10.3* 9.3*  HCT  --  32.2* 28.4*  PLT  --  204 196  APTT 26  --   --   LABPROT 15.8*  --  17.2*  INR 1.24  --  1.40  CREATININE  --  0.99 0.92    Estimated Creatinine Clearance: 71 mL/min (by C-G formula based on Cr of 0.92).   Medical History: Past Medical History  Diagnosis Date  . Atrial fibrillation     ablation x 2 [facility], 01/2006, 2011.  on warfarin  . Hyperlipidemia   . Depression   . Anxiety   . Anemia   . Arthritis   . GERD (gastroesophageal reflux disease)   . Vocal cord disease   . Cardiac arrest jan 2012    in hospital for pneumonia when this occured- occured at the hospital  . GASTROESOPHAGEAL REFLUX, NO ESOPHAGITIS 06/10/2006    Qualifier: Diagnosis of  By: Abundio Miu    . Non-ischemic cardiomyopathy     echo 08/08/10 - EF 35-45% LV and RV systolic function mod reduced  . Limb pain 06/04/2008    LLE, Baker's cyst in popliteal fossa, no DVT  . Primary localized osteoarthritis of left knee   . History of hiatal hernia   . CHF (congestive heart failure) 2012    Echo 08/08/10 by [facility]. EF 35-45%. LV systolic function moderately reduced. Moderate global hypokinesis of LV.  RV systolic function moderately reduced. Mild MR. Trace AR.  Marland Kitchen AICD (automatic cardioverter/defibrillator) present     [employer]  .  Asthma     has had multiple hospitalizations for this  . Complication of anesthesia     difficult time waking up after anesthesia  . Hypertension   . Dysrhythmia     Atrial fibrillation  . Diabetes mellitus     Type 2  . Sleep apnea     wears CPAP nightly    Medications:  Prescriptions prior to admission  Medication Sig Dispense Refill Last Dose  . B-D ULTRAFINE III SHORT PEN 31G X 8 MM MISC use as directed 100 each 9 Taking  . budesonide (PULMICORT FLEXHALER) 180 MCG/ACT inhaler Inhale 2 puffs into the lungs 2 (two) times daily. 1 each 11 09/16/2014 at Unknown time  . buPROPion (WELLBUTRIN XL) 150 MG 24 hr tablet take 1 tablet by mouth once daily (Patient taking differently: Take 150 mg by mouth daily. take 1 tablet by mouth once daily) 30 tablet 8 09/17/2014 at 0400  . chlorpheniramine (CHLOR-TRIMETON) 4 MG tablet 8mg  at night (Patient taking differently: Take 8 mg by mouth at bedtime. 8mg  at night) 14 tablet 0 Past Week at Unknown time  . DIGITEK 125 MCG tablet take 1/2 tablets by mouth once daily (Patient taking differently: take 1/2 tablets by mouth  once daily 0.0625 mg daily) 15 tablet 9 09/17/2014 at 0400  . dofetilide (TIKOSYN) 250 MCG capsule Take 250 mcg by mouth 2 (two) times daily.   09/17/2014 at 0400  . fluticasone (FLONASE) 50 MCG/ACT nasal spray instill 1 spray into each nostril twice a day (Patient taking differently: Place 1 spray into both nostrils daily. instill 1 spray into each nostril twice a day) 16 g 5 09/17/2014 at 0400  . insulin aspart (NOVOLOG FLEXPEN) 100 UNIT/ML FlexPen Please inject 5 units with large meal of day (Patient taking differently: Inject 5 Units into the skin See admin instructions. Please inject 5 units with large meal of day) 15 mL 11 Past Week at Unknown time  . Insulin Glargine (LANTUS) 100 UNIT/ML Solostar Pen Inject 38 Units into the skin at bedtime. (Patient taking differently: Inject 40 Units into the skin daily before breakfast. ) 15 mL 12 09/17/2014 at  0400  . levalbuterol (XOPENEX HFA) 45 MCG/ACT inhaler inhale 1 to 2 puffs by mouth INTO THE LUNGS every 4 hours if needed for wheezing (Patient taking differently: Inhale 1-2 puffs into the lungs every 4 (four) hours as needed for wheezing. inhale 1 to 2 puffs by mouth INTO THE LUNGS every 4 hours if needed for wheezing) 15 g 4 09/16/2014 at Unknown time  . levalbuterol (XOPENEX) 0.63 MG/3ML nebulizer solution One vial in nebulizer four times daily as needed dx 493.00 (Patient taking differently: Take 0.63 mg by nebulization every 6 (six) hours as needed for wheezing or shortness of breath. One vial in nebulizer four times daily as needed dx 493.00) 360 mL 4 Past Month at Unknown time  . losartan (COZAAR) 50 MG tablet take 1 tablet by mouth once daily (Patient taking differently: Take 50 mg by mouth daily. take 1 tablet by mouth once daily) 90 tablet 3 09/17/2014 at 0400  . metoprolol (LOPRESSOR) 50 MG tablet take 1 tablet by mouth twice a day (Patient taking differently: Take 50 mg by mouth 2 (two) times daily. take 1 tablet by mouth twice a day) 60 tablet 11 09/17/2014 at 0400  . NEXIUM 40 MG capsule take 1 capsule by mouth once daily 30 capsule 6 09/17/2014 at 0400  . Omega-3 Fatty Acids (FISH OIL) 1200 MG CAPS Take 1 capsule by mouth daily.   Past Month at Unknown time  . polyethylene glycol powder (GLYCOLAX/MIRALAX) powder take 17GM (DISSOLVED IN WATER) by mouth once daily (Patient taking differently: Take 17 g by mouth daily. take 17GM (DISSOLVED IN WATER) by mouth once daily) 255 g 5 Past Week at Unknown time  . RA LORATADINE 10 MG tablet take 1 tablet by mouth once daily 30 tablet 5 09/17/2014 at 0400  . rosuvastatin (CRESTOR) 10 MG tablet Take 1 tablet (10 mg total) by mouth daily. 90 tablet 2 09/16/2014 at Unknown time  . spironolactone (ALDACTONE) 25 MG tablet Take 0.5 tablets (12.5 mg total) by mouth daily. 30 tablet 6 09/16/2014 at Unknown time  . torsemide (DEMADEX) 10 MG tablet take 2 tablets by mouth  once daily or as directed 70 tablet 6 09/16/2014 at Unknown time  . warfarin (COUMADIN) 5 MG tablet take 1 and 1/2 tablets by mouth once daily (Patient taking differently: Take 1 to 1 and 1/2 tablets by mouth once daily ( 5 mg Mon, Wed, Fri, and Sat;    7.5 mg Sun, Tues, Thurs)) 45 tablet 5 Past Week at Unknown time  . HYDROcodone-homatropine (HYDROMET) 5-1.5 MG/5ML syrup Take 5 mLs by mouth  every 6 (six) hours as needed for cough. 240 mL 0 More than a month at Unknown time  . Insulin Pen Needle (B-D ULTRAFINE III SHORT PEN) 31G X 8 MM MISC Use as directed 100 each 9     Assessment: CC: left knee pain admitted for total knee replacement  PMH: arthritis, T2DM, HLD, HTN, A Fib, ICD  AC: afib PTA, not s/p total knee replacement. Home warfarin was 5 mg MWFSat, 7.5 mg T/T/Sun  Current INR up to 1.4 after one dose of Coumadin. Also on enoxaparin 30 mg BID till INR > 2   Goal of Therapy:  INR 2-3 Monitor platelets by anticoagulation protocol: Yes   Plan:  -Warfarin 7.5 mg x 1 tonight -D/C Enoxaparin when INR > 2  -Daily INR -Monitor for s/sx bleeding -May resume home Coumadin dose when ready for discharge with outpatient INR check in 2-3 days post discharge  Vinnie Level, PharmD., BCPS Clinical Pharmacist Pager [phone]

## 2014-09-18 NOTE — Progress Notes (Signed)
Subjective: 1 Day Post-Op Procedure(s) (LRB): TOTAL KNEE ARTHROPLASTY (Left) Patient reports pain as 7 on 0-10 scale.    Objective: Vital signs in last 24 hours: Temp:  [97.4 F (36.3 C)-98.2 F (36.8 C)] 98.2 F (36.8 C) (06/07 0530) Pulse Rate:  [63-93] 69 (06/07 0530) Resp:  [10-18] 18 (06/07 0530) BP: (90-127)/(43-71) 103/56 mmHg (06/07 0530) SpO2:  [97 %-100 %] 97 % (06/07 0530) FiO2 (%):  [28 %] 28 % (06/06 1110)  Intake/Output from previous day: 06/06 0701 - 06/07 0700 In: 1700 [P.O.:600; I.V.:1000; IV Piggyback:100] Out: 1525 [Urine:1225; Drains:300] Intake/Output this shift:     Recent Labs  09/17/14 1430  HGB 10.3*    Recent Labs  09/17/14 1430  WBC 8.1  RBC 3.66*  HCT 32.2*  PLT 204    Recent Labs  09/17/14 1430  CREATININE 0.99    Recent Labs  09/17/14 0611  INR 1.24    ABD soft Neurovascular intact Sensation intact distally Intact pulses distally Dorsiflexion/Plantar flexion intact Incision: dressing C/D/I  Assessment/Plan: 1 Day Post-Op Procedure(s) (LRB): TOTAL KNEE ARTHROPLASTY (Left)  Principal Problem:   Primary localized osteoarthritis of left knee Active Problems:   Type 2 diabetes, uncontrolled, with neuropathy   HYPERCHOLESTEROLEMIA   HYPERTENSION, BENIGN SYSTEMIC   Atrial fibrillation   Sleep apnea   Chronic systolic heart failure   Diabetic neuropathy   Asthma, moderate persistent   CKD (chronic kidney disease) stage 2, GFR 60-89 ml/min   ICD (St. Jude Protecta dual-chamber),secondary prevention (VF arrest) January 2012   Obesity (BMI 30-39.9)   DJD (degenerative joint disease) of knee  Advance diet Up with therapy Plan for discharge tomorrow  Pascal Lux 09/18/2014, 7:25 AM

## 2014-09-18 NOTE — Progress Notes (Signed)
Physical Therapy Treatment Patient Details Name: [name] MRN: [id number] DOB: June 04, 1950 Today's Date: 09/18/2014    History of Present Illness Pt is a 64 y/o F s/p L TKA. PT's PMH includes heel pain, obesity, CKD, asthma, trochanteric busitis of R hip, diabetic neuropathy, CHF, HTN, a fib, depression, anxiety, DM, cardiac arrest.    PT Comments    Patient is making good progress with PT.  Pt ambulated 150 ft this session w/ supervision w/ focus on safely increasing gait speed.  Pt will benefit from continued skilled PT services to increase functional independence and safety.   Follow Up Recommendations  Home health PT;Supervision - Intermittent     Equipment Recommendations  None recommended by PT    Recommendations for Other Services       Precautions / Restrictions Precautions Precautions: Fall;Knee Precaution Comments: Reviewed no pillow under knee Restrictions Weight Bearing Restrictions: Yes LLE Weight Bearing: Weight bearing as tolerated    Mobility  Bed Mobility               General bed mobility comments: in recliner  Transfers Overall transfer level: Needs assistance Equipment used: Rolling walker (2 wheeled) Transfers: Sit to/from Stand Sit to Stand: Supervision         General transfer comment: Supervision for safety only  Ambulation/Gait Ambulation/Gait assistance: Supervision Ambulation Distance (Feet): 150 Feet Assistive device: Rolling walker (2 wheeled) Gait Pattern/deviations: Step-through pattern;Antalgic;Decreased stride length;Trunk flexed   Gait velocity interpretation: Below normal speed for age/gender General Gait Details: Supervision for safety only.  Cues to stand upright and to increase gait speed safely.   Stairs Stairs: Yes Stairs assistance: Min guard Stair Management: No rails;Backwards;With walker Number of Stairs: 1 (x2) General stair comments: Demonstration and verbal cues for technique.    Wheelchair  Mobility    Modified Rankin (Stroke Patients Only)       Balance Overall balance assessment: Needs assistance Sitting-balance support: No upper extremity supported;Feet supported Sitting balance-Leahy Scale: Good     Standing balance support: Bilateral upper extremity supported;During functional activity Standing balance-Leahy Scale: Fair Standing balance comment: Pt able to stand w/o UEs supported eating ice                    Cognition Arousal/Alertness: Awake/alert Behavior During Therapy: WFL for tasks assessed/performed Overall Cognitive Status: Within Functional Limits for tasks assessed                      Exercises Total Joint Exercises Ankle Circles/Pumps: AROM;Both;15 reps;Seated Long Arc Quad: AROM;Left;10 reps;Seated Knee Flexion: AROM;Left;5 reps;Seated Goniometric ROM: 10-95    General Comments General comments (skin integrity, edema, etc.): c/o nausea, RN notified      Pertinent Vitals/Pain Pain Assessment: 0-10 Pain Score: 8  Pain Location: L knee Pain Descriptors / Indicators: Aching;Grimacing;Discomfort Pain Intervention(s): Limited activity within patient's tolerance;Monitored during session;Repositioned    Home Living                      Prior Function            PT Goals (current goals can now be found in the care plan section) Acute Rehab PT Goals Patient Stated Goal: to get stronger and get back to walking Progress towards PT goals: Progressing toward goals    Frequency  7X/week    PT Plan Current plan remains appropriate    Co-evaluation  End of Session Equipment Utilized During Treatment: Gait belt Activity Tolerance: Patient tolerated treatment well Patient left: in chair;with call bell/phone within reach;with family/visitor present     Time: 1441-1501 PT Time Calculation (min) (ACUTE ONLY): 20 min  Charges:  $Gait Training: 8-22 mins                    G Codes:      Michail Jewels PT, [place] [phone] Pager: [phone] 09/18/2014, 4:40 PM

## 2014-09-18 NOTE — Progress Notes (Signed)
Inpatient Diabetes Program Recommendations  AACE/ADA: New Consensus Statement on Inpatient Glycemic Control (2013)  Target Ranges:  Prepandial:   less than 140 mg/dL      Peak postprandial:   less than 180 mg/dL (1-2 hours)      Critically ill patients:  140 - 180 mg/dL   While on Decadron, cbg's running high. Noted increase in lantus and correction, however meal coverage at all meals may be helpful. Inpatient Diabetes Program Recommendations Insulin - Meal Coverage: Please consider using meal coverage for breakfast and lunch as well as supper-5 units tidwc (in addition to correction and basal)  Thank you Lenor Coffin, RN, MSN, CDE  Diabetes Inpatient Program Office: [phone] Pager: [phone] 8:00 am to 5:00 pm

## 2014-09-19 ENCOUNTER — Inpatient Hospital Stay (HOSPITAL_COMMUNITY): Payer: Medicare Other

## 2014-09-19 LAB — CBC
HCT: 27.6 % — ABNORMAL LOW (ref 36.0–46.0)
HEMOGLOBIN: 9.1 g/dL — AB (ref 12.0–15.0)
MCH: 28.9 pg (ref 26.0–34.0)
MCHC: 33 g/dL (ref 30.0–36.0)
MCV: 87.6 fL (ref 78.0–100.0)
Platelets: 190 10*3/uL (ref 150–400)
RBC: 3.15 MIL/uL — ABNORMAL LOW (ref 3.87–5.11)
RDW: 13.6 % (ref 11.5–15.5)
WBC: 12.6 10*3/uL — AB (ref 4.0–10.5)

## 2014-09-19 LAB — GLUCOSE, CAPILLARY
GLUCOSE-CAPILLARY: 289 mg/dL — AB (ref 65–99)
GLUCOSE-CAPILLARY: 223 mg/dL — AB (ref 65–99)
Glucose-Capillary: 234 mg/dL — ABNORMAL HIGH (ref 65–99)
Glucose-Capillary: 248 mg/dL — ABNORMAL HIGH (ref 65–99)

## 2014-09-19 LAB — BASIC METABOLIC PANEL
Anion gap: 8 (ref 5–15)
BUN: 14 mg/dL (ref 6–20)
CO2: 25 mmol/L (ref 22–32)
Calcium: 8.1 mg/dL — ABNORMAL LOW (ref 8.9–10.3)
Chloride: 99 mmol/L — ABNORMAL LOW (ref 101–111)
Creatinine, Ser: 0.8 mg/dL (ref 0.44–1.00)
GFR calc Af Amer: >60 mL/min (ref >60)
GFR calc non Af Amer: >60 mL/min (ref >60)
Glucose, Bld: 262 mg/dL — ABNORMAL HIGH (ref 65–99)
Potassium: 4.8 mmol/L (ref 3.5–5.1)
SODIUM: 132 mmol/L — AB (ref 135–145)

## 2014-09-19 LAB — PROTIME-INR
INR: 1.47 (ref 0.00–1.49)
Prothrombin Time: 17.9 seconds — ABNORMAL HIGH (ref 11.6–15.2)

## 2014-09-19 MED ORDER — BISACODYL 10 MG RE SUPP
10.0000 mg | Freq: Once | RECTAL | Status: AC
  Administered 2014-09-19: 10 mg via RECTAL
  Filled 2014-09-19: qty 1

## 2014-09-19 MED ORDER — WARFARIN SODIUM 7.5 MG PO TABS
7.5000 mg | ORAL_TABLET | Freq: Once | ORAL | Status: AC
  Administered 2014-09-19: 7.5 mg via ORAL
  Filled 2014-09-19: qty 1

## 2014-09-19 MED ORDER — AMOXICILLIN-POT CLAVULANATE 875-125 MG PO TABS
1.0000 | ORAL_TABLET | Freq: Two times a day (BID) | ORAL | Status: DC
  Administered 2014-09-19 – 2014-09-20 (×3): 1 via ORAL
  Filled 2014-09-19 (×3): qty 1

## 2014-09-19 MED ORDER — INSULIN GLARGINE 100 UNIT/ML ~~LOC~~ SOLN
60.0000 [IU] | Freq: Every day | SUBCUTANEOUS | Status: DC
  Administered 2014-09-19: 60 [IU] via SUBCUTANEOUS
  Filled 2014-09-19 (×2): qty 0.6

## 2014-09-19 MED ORDER — SPIRONOLACTONE 12.5 MG HALF TABLET
12.5000 mg | ORAL_TABLET | Freq: Every day | ORAL | Status: DC
  Administered 2014-09-19 – 2014-09-20 (×2): 12.5 mg via ORAL
  Filled 2014-09-19 (×2): qty 1

## 2014-09-19 MED ORDER — TORSEMIDE 20 MG PO TABS
20.0000 mg | ORAL_TABLET | Freq: Every day | ORAL | Status: DC
  Administered 2014-09-19 – 2014-09-20 (×2): 20 mg via ORAL
  Filled 2014-09-19 (×2): qty 1

## 2014-09-19 NOTE — Progress Notes (Signed)
Subjective: 2 Days Post-Op Procedure(s) (LRB): TOTAL KNEE ARTHROPLASTY (Left) Patient reports pain as 3 on 0-10 scale.    Objective: Vital signs in last 24 hours: Temp:  [97.7 F (36.5 C)-98.4 F (36.9 C)] 98.2 F (36.8 C) (06/08 0400) Pulse Rate:  [67-76] 76 (06/08 0400) Resp:  [18] 18 (06/08 0400) BP: (103-115)/(58-82) 115/62 mmHg (06/08 0400) SpO2:  [96 %-99 %] 97 % (06/08 0806)  Intake/Output from previous day: 06/07 0701 - 06/08 0700 In: 720 [P.O.:720] Out: -  Intake/Output this shift:     Recent Labs  09/17/14 1430 09/18/14 0655 09/19/14 0506  HGB 10.3* 9.3* 9.1*    Recent Labs  09/18/14 0655 09/19/14 0506  WBC 10.9* 12.6*  RBC 3.23* 3.15*  HCT 28.4* 27.6*  PLT 196 190    Recent Labs  09/18/14 0655 09/19/14 0506  NA 135 132*  K 4.5 4.8  CL 102 99*  CO2 26 25  BUN 13 14  CREATININE 0.92 0.80  GLUCOSE 319* 262*  CALCIUM 8.3* 8.1*    Recent Labs  09/18/14 0655 09/19/14 0506  INR 1.40 1.47    ABD soft Neurovascular intact Sensation intact distally Intact pulses distally Incision: scant drainage  Assessment/Plan: 2 Days Post-Op Procedure(s) (LRB): TOTAL KNEE ARTHROPLASTY (Left) Advance diet Up with therapy Discharge home with home health   WBC is rising and has a history of an enterococcus UTI this was treated with doxycycline as an outpatient preoperatively.  UA on day of admission showed no bacteria in her urine  Have resumed diuretics and have order a CXR to eval for her rising WBC   Will also get a UA and give dulcolax suppository.  Dimarco Minkin J 09/19/2014, 8:22 AM

## 2014-09-19 NOTE — Progress Notes (Signed)
Physical Therapy Treatment Patient Details Name: [name] MRN: [id number] DOB: 28-May-1950 Today's Date: 09/19/2014    History of Present Illness Pt is a 64 y/o F s/p L TKA. PT's PMH includes heel pain, obesity, CKD, asthma, trochanteric busitis of R hip, diabetic neuropathy, CHF, HTN, a fib, depression, anxiety, DM, cardiac arrest.    PT Comments    Patient mobilizing very well today. Utilized straight cane for mobility and stair negotiation without physical assist. Patient education performed regarding mobility expectations, ROM activity, car transfers and safety with mobility. Patient receptive and appreciative. Recommend use of single point cane for discharge.   Follow Up Recommendations  Home health PT;Supervision - Intermittent     Equipment Recommendations  Cane    Recommendations for Other Services       Precautions / Restrictions Precautions Precautions: Fall;Knee Precaution Comments: Reviewed no pillow under knee Restrictions Weight Bearing Restrictions: Yes LLE Weight Bearing: Weight bearing as tolerated    Mobility  Bed Mobility               General bed mobility comments: in recliner  Transfers Overall transfer level: Needs assistance Equipment used: Rolling walker (2 wheeled) Transfers: Sit to/from Stand Sit to Stand: Supervision         General transfer comment: Supervision for safety only  Ambulation/Gait Ambulation/Gait assistance: Modified independent (Device/Increase time);Supervision Ambulation Distance (Feet): 240 Feet Assistive device: Rolling walker (2 wheeled);Straight cane (ambulated 140 ft with straight cane) Gait Pattern/deviations: Step-through pattern;Antalgic;Decreased stride length;Trunk flexed   Gait velocity interpretation: Below normal speed for age/gender General Gait Details: Supervision for safety only.  Cues to stand upright and to increase gait speed safely.   Stairs Stairs: Yes Stairs assistance:  Supervision Stair Management: One rail Right;With cane Number of Stairs: 4 General stair comments: Able to perform without physical assist, tolerated well, VCs for sequencing  Wheelchair Mobility    Modified Rankin (Stroke Patients Only)       Balance   Sitting-balance support: No upper extremity supported Sitting balance-Leahy Scale: Good     Standing balance support: Single extremity supported Standing balance-Leahy Scale: Fair                      Cognition Arousal/Alertness: Awake/alert Behavior During Therapy: WFL for tasks assessed/performed Overall Cognitive Status: Within Functional Limits for tasks assessed                      Exercises Total Joint Exercises Ankle Circles/Pumps: AROM;Both;15 reps;Seated    General Comments General comments (skin integrity, edema, etc.): reinforced education for discharge regarding mobiklity expectations, ROM activities, precautions, and car transfers. patient receptive with good carry over via teachback. Anticipate patient will do well with discharge.      Pertinent Vitals/Pain Pain Assessment: 0-10 Pain Score: 6  Pain Location: left knee Pain Descriptors / Indicators: Sore Pain Intervention(s): Monitored during session;Repositioned;Relaxation    Home Living                      Prior Function            PT Goals (current goals can now be found in the care plan section) Acute Rehab PT Goals Patient Stated Goal: to get stronger and get back to walking PT Goal Formulation: With patient Time For Goal Achievement: 09/24/14 Potential to Achieve Goals: Good Progress towards PT goals: Progressing toward goals    Frequency  7X/week    PT Plan  Current plan remains appropriate    Co-evaluation             End of Session Equipment Utilized During Treatment: Gait belt Activity Tolerance: Patient tolerated treatment well Patient left: in chair;with call bell/phone within reach      Time: 0830-0852 PT Time Calculation (min) (ACUTE ONLY): 22 min  Charges:  $Gait Training: 8-22 mins                    G CodesFabio Asa 10-14-14, 10:26 AM Charlotte Crumb, PT DPT  [phone]

## 2014-09-19 NOTE — Progress Notes (Signed)
ANTICOAGULATION CONSULT NOTE - Follow Up Consult  Pharmacy Consult for Warfarin  Indication: atrial fibrillation, dvt prop s/p total knee replacement  Allergies  Allergen Reactions  . Avelox [Moxifloxacin Hcl In Nacl]     Cardiac arrest per pt  . Simvastatin Other (See Comments)    myalgias  . Ace Inhibitors Cough    Dry cough   . Latex Rash    Patient Measurements: Height:  (167.6 cm) Weight: 200 lb (90.719 kg) IBW/kg (Calculated) : 59.3  Vital Signs: Temp: 97.8 F (36.6 C) (06/08 1253) Temp Source: Oral (06/08 1253) BP: 130/72 mmHg (06/08 1253) Pulse Rate: 80 (06/08 1253)  Labs:  Recent Labs  09/17/14 0611  09/17/14 1430 09/18/14 0655 09/19/14 0506  HGB  --   < > 10.3* 9.3* 9.1*  HCT  --   --  32.2* 28.4* 27.6*  PLT  --   --  204 196 190  APTT 26  --   --   --   --   LABPROT 15.8*  --   --  17.2* 17.9*  INR 1.24  --   --  1.40 1.47  CREATININE  --   --  0.99 0.92 0.80  < > = values in this interval not displayed.  Estimated Creatinine Clearance: 81.7 mL/min (by C-G formula based on Cr of 0.8).   Medical History: Past Medical History  Diagnosis Date  . Atrial fibrillation     ablation x 2 [facility], 01/2006, 2011.  on warfarin  . Hyperlipidemia   . Depression   . Anxiety   . Anemia   . Arthritis   . GERD (gastroesophageal reflux disease)   . Vocal cord disease   . Cardiac arrest jan 2012    in hospital for pneumonia when this occured- occured at the hospital  . GASTROESOPHAGEAL REFLUX, NO ESOPHAGITIS 06/10/2006    Qualifier: Diagnosis of  By: Abundio Miu    . Non-ischemic cardiomyopathy     echo 08/08/10 - EF 35-45% LV and RV systolic function mod reduced  . Limb pain 06/04/2008    LLE, Baker's cyst in popliteal fossa, no DVT  . Primary localized osteoarthritis of left knee   . History of hiatal hernia   . CHF (congestive heart failure) 2012    Echo 08/08/10 by [facility]. EF 35-45%. LV systolic function moderately reduced. Moderate  global hypokinesis of LV.  RV systolic function moderately reduced. Mild MR. Trace AR.  Marland Kitchen AICD (automatic cardioverter/defibrillator) present     [employer]tronic  . Asthma     has had multiple hospitalizations for this  . Complication of anesthesia     difficult time waking up after anesthesia  . Hypertension   . Dysrhythmia     Atrial fibrillation  . Diabetes mellitus     Type 2  . Sleep apnea     wears CPAP nightly    Medications:  Prescriptions prior to admission  Medication Sig Dispense Refill Last Dose  . B-D ULTRAFINE III SHORT PEN 31G X 8 MM MISC use as directed 100 each 9 Taking  . budesonide (PULMICORT FLEXHALER) 180 MCG/ACT inhaler Inhale 2 puffs into the lungs 2 (two) times daily. 1 each 11 09/16/2014 at Unknown time  . buPROPion (WELLBUTRIN XL) 150 MG 24 hr tablet take 1 tablet by mouth once daily (Patient taking differently: Take 150 mg by mouth daily. take 1 tablet by mouth once daily) 30 tablet 8 09/17/2014 at 0400  . chlorpheniramine (CHLOR-TRIMETON) 4 MG  tablet  at night (Patient taking differently: Take 8 mg by mouth at bedtime.  at night) 14 tablet 0 Past Week at Unknown time  . DIGITEK 125 MCG tablet take 1/2 tablets by mouth once daily (Patient taking differently: take 1/2 tablets by mouth once daily 0.0625 mg daily) 15 tablet 9 09/17/2014 at 0400  . dofetilide (TIKOSYN) 250 MCG capsule Take 250 mcg by mouth 2 (two) times daily.   09/17/2014 at 0400  . fluticasone (FLONASE) 50 MCG/ACT nasal spray instill 1 spray into each nostril twice a day (Patient taking differently: Place 1 spray into both nostrils daily. instill 1 spray into each nostril twice a day) 16 g 5 09/17/2014 at 0400  . insulin aspart (NOVOLOG FLEXPEN) 100 UNIT/ML FlexPen Please inject 5 units with large meal of day (Patient taking differently: Inject 5 Units into the skin See admin instructions. Please inject 5 units with large meal of day) 15 mL 11 Past Week at Unknown time  . Insulin Glargine (LANTUS) 100  UNIT/ML Solostar Pen Inject 38 Units into the skin at bedtime. (Patient taking differently: Inject 40 Units into the skin daily before breakfast. ) 15 mL 12 09/17/2014 at 0400  . levalbuterol (XOPENEX HFA) 45 MCG/ACT inhaler inhale 1 to 2 puffs by mouth INTO THE LUNGS every 4 hours if needed for wheezing (Patient taking differently: Inhale 1-2 puffs into the lungs every 4 (four) hours as needed for wheezing. inhale 1 to 2 puffs by mouth INTO THE LUNGS every 4 hours if needed for wheezing) 15 g 4 09/16/2014 at Unknown time  . levalbuterol (XOPENEX) 0.63 MG/3ML nebulizer solution One vial in nebulizer four times daily as needed dx 493.00 (Patient taking differently: Take 0.63 mg by nebulization every 6 (six) hours as needed for wheezing or shortness of breath. One vial in nebulizer four times daily as needed dx 493.00) 360 mL 4 Past Month at Unknown time  . losartan (COZAAR) 50 MG tablet take 1 tablet by mouth once daily (Patient taking differently: Take 50 mg by mouth daily. take 1 tablet by mouth once daily) 90 tablet 3 09/17/2014 at 0400  . metoprolol (LOPRESSOR) 50 MG tablet take 1 tablet by mouth twice a day (Patient taking differently: Take 50 mg by mouth 2 (two) times daily. take 1 tablet by mouth twice a day) 60 tablet 11 09/17/2014 at 0400  . NEXIUM 40 MG capsule take 1 capsule by mouth once daily 30 capsule 6 09/17/2014 at 0400  . Omega-3 Fatty Acids (FISH OIL) 1200 MG CAPS Take 1 capsule by mouth daily.   Past Month at Unknown time  . polyethylene glycol powder (GLYCOLAX/MIRALAX) powder take 17GM (DISSOLVED IN WATER) by mouth once daily (Patient taking differently: Take 17 g by mouth daily. take 17GM (DISSOLVED IN WATER) by mouth once daily) 255 g 5 Past Week at Unknown time  . RA LORATADINE 10 MG tablet take 1 tablet by mouth once daily 30 tablet 5 09/17/2014 at 0400  . rosuvastatin (CRESTOR) 10 MG tablet Take 1 tablet (10 mg total) by mouth daily. 90 tablet 2 09/16/2014 at Unknown time  . spironolactone  (ALDACTONE) 25 MG tablet Take 0.5 tablets (12.5 mg total) by mouth daily. 30 tablet 6 09/16/2014 at Unknown time  . torsemide (DEMADEX) 10 MG tablet take 2 tablets by mouth once daily or as directed 70 tablet 6 09/16/2014 at Unknown time  . warfarin (COUMADIN) 5 MG tablet take 1 and 1/2 tablets by mouth once daily (Patient taking differently: Take  1 to 1 and 1/2 tablets by mouth once daily ( 5 mg Mon, Wed, Fri, and Sat;    7.5 mg Sun, Tues, Thurs)) 45 tablet 5 Past Week at Unknown time  . HYDROcodone-homatropine (HYDROMET) 5-1.5 MG/5ML syrup Take 5 mLs by mouth every 6 (six) hours as needed for cough. 240 mL 0 More than a month at Unknown time  . Insulin Pen Needle (B-D ULTRAFINE III SHORT PEN) 31G X 8 MM MISC Use as directed 100 each 9     Assessment: CC: left knee pain admitted for total knee replacement  PMH: arthritis, T2DM, HLD, HTN, A Fib, ICD  AC: afib PTA, not s/p total knee replacement. Home warfarin was 5 mg MWFSat, 7.5 mg T/T/Sun  Current INR up to 1.47 after two doses of Coumadin. Also on enoxaparin 30 mg BID till INR > 2   Goal of Therapy:  INR 2-3 Monitor platelets by anticoagulation protocol: Yes   Plan:  -Warfarin 7.5 mg x 1 tonight -D/C Enoxaparin when INR > 2  -Daily INR -Monitor for s/sx bleeding -May resume home Coumadin dose when ready for discharge with outpatient INR check in 2-3 days post discharge  Vinnie Level, PharmD., BCPS Clinical Pharmacist Pager [phone]

## 2014-09-19 NOTE — Progress Notes (Signed)
   09/19/14 2127  BiPAP/CPAP/SIPAP  BiPAP/CPAP/SIPAP Pt Type Adult  Mask Type Nasal pillows  Set Rate 0 breaths/min  Respiratory Rate 16 breaths/min  IPAP (Max IPAP=20)  EPAP (Min EPAP=5)  BiPAP/CPAP/SIPAP CPAP  Patient Home Equipment No  Auto Titrate Yes  Patient on CPAP Auto Mode with nasal prongs.

## 2014-09-20 LAB — BASIC METABOLIC PANEL
ANION GAP: 7 (ref 5–15)
BUN: 21 mg/dL — AB (ref 6–20)
CALCIUM: 8.1 mg/dL — AB (ref 8.9–10.3)
CO2: 27 mmol/L (ref 22–32)
CREATININE: 1.04 mg/dL — AB (ref 0.44–1.00)
Chloride: 99 mmol/L — ABNORMAL LOW (ref 101–111)
GFR calc Af Amer: >60 mL/min (ref >60)
GFR calc non Af Amer: 56 mL/min — ABNORMAL LOW (ref >60)
Glucose, Bld: 377 mg/dL — ABNORMAL HIGH (ref 65–99)
POTASSIUM: 5 mmol/L (ref 3.5–5.1)
Sodium: 133 mmol/L — ABNORMAL LOW (ref 135–145)

## 2014-09-20 LAB — CBC
HCT: 27.1 % — ABNORMAL LOW (ref 36.0–46.0)
HEMOGLOBIN: 8.9 g/dL — AB (ref 12.0–15.0)
MCH: 28.7 pg (ref 26.0–34.0)
MCHC: 32.8 g/dL (ref 30.0–36.0)
MCV: 87.4 fL (ref 78.0–100.0)
PLATELETS: 184 10*3/uL (ref 150–400)
RBC: 3.1 MIL/uL — ABNORMAL LOW (ref 3.87–5.11)
RDW: 13.5 % (ref 11.5–15.5)
WBC: 10.7 10*3/uL — AB (ref 4.0–10.5)

## 2014-09-20 LAB — PROTIME-INR
INR: 1.72 — AB (ref 0.00–1.49)
PROTHROMBIN TIME: 20.2 s — AB (ref 11.6–15.2)

## 2014-09-20 LAB — GLUCOSE, CAPILLARY
Glucose-Capillary: 360 mg/dL — ABNORMAL HIGH (ref 65–99)
Glucose-Capillary: 259 mg/dL — ABNORMAL HIGH (ref 65–99)
Glucose-Capillary: 166 mg/dL — ABNORMAL HIGH (ref 65–99)

## 2014-09-20 MED ORDER — WARFARIN SODIUM 7.5 MG PO TABS: 7.5000 mg | ORAL_TABLET | Freq: Once | ORAL | Status: DC

## 2014-09-20 MED ORDER — AMOXICILLIN-POT CLAVULANATE 875-125 MG PO TABS: 1.0000 | ORAL_TABLET | Freq: Two times a day (BID) | ORAL | Status: DC

## 2014-09-20 MED ORDER — OXYCODONE HCL ER 20 MG PO T12A
EXTENDED_RELEASE_TABLET | ORAL | Status: DC
Start: 2014-09-20 — End: 2014-12-03

## 2014-09-20 MED ORDER — INSULIN GLARGINE 100 UNIT/ML SOLOSTAR PEN: 60.0000 [IU] | PEN_INJECTOR | Freq: Every day | SUBCUTANEOUS | Status: DC

## 2014-09-20 MED ORDER — OXYCODONE HCL 5 MG PO TABS: ORAL_TABLET | ORAL | Status: DC

## 2014-09-20 MED ORDER — DOCUSATE SODIUM 100 MG PO CAPS: ORAL_CAPSULE | ORAL | Status: DC

## 2014-09-20 NOTE — Progress Notes (Signed)
ANTICOAGULATION CONSULT NOTE - Follow Up Consult  Pharmacy Consult for Warfarin  Indication: atrial fibrillation, dvt prop s/p total knee replacement  Allergies  Allergen Reactions  . Avelox [Moxifloxacin Hcl In Nacl]     Cardiac arrest per pt  . Simvastatin Other (See Comments)    myalgias  . Ace Inhibitors Cough    Dry cough   . Latex Rash    Patient Measurements: Height:  (167.6 cm) Weight: 200 lb (90.719 kg) IBW/kg (Calculated) : 59.3  Vital Signs: Temp: 98.1 F (36.7 C) (06/09 0507) BP: 111/60 mmHg (06/09 0507) Pulse Rate: 59 (06/09 0507)  Labs:  Recent Labs  09/18/14 0655 09/19/14 0506 09/20/14 0435  HGB 9.3* 9.1* 8.9*  HCT 28.4* 27.6* 27.1*  PLT 196 190 184  LABPROT 17.2* 17.9* 20.2*  INR 1.40 1.47 1.72*  CREATININE 0.92 0.80 1.04*    Estimated Creatinine Clearance: 62.8 mL/min (by C-G formula based on Cr of 1.04).   Medical History: Past Medical History  Diagnosis Date  . Atrial fibrillation     ablation x 2 [facility], 01/2006, 2011.  on warfarin  . Hyperlipidemia   . Depression   . Anxiety   . Anemia   . Arthritis   . GERD (gastroesophageal reflux disease)   . Vocal cord disease   . Cardiac arrest jan 2012    in hospital for pneumonia when this occured- occured at the hospital  . GASTROESOPHAGEAL REFLUX, NO ESOPHAGITIS 06/10/2006    Qualifier: Diagnosis of  By: Abundio Miu    . Non-ischemic cardiomyopathy     echo 08/08/10 - EF 35-45% LV and RV systolic function mod reduced  . Limb pain 06/04/2008    LLE, Baker's cyst in popliteal fossa, no DVT  . Primary localized osteoarthritis of left knee   . History of hiatal hernia   . CHF (congestive heart failure) 2012    Echo 08/08/10 by [facility]. EF 35-45%. LV systolic function moderately reduced. Moderate global hypokinesis of LV.  RV systolic function moderately reduced. Mild MR. Trace AR.  Marland Kitchen AICD (automatic cardioverter/defibrillator) present     [employer]  . Asthma     has had  multiple hospitalizations for this  . Complication of anesthesia     difficult time waking up after anesthesia  . Hypertension   . Dysrhythmia     Atrial fibrillation  . Diabetes mellitus     Type 2  . Sleep apnea     wears CPAP nightly    Medications:  Prescriptions prior to admission  Medication Sig Dispense Refill Last Dose  . B-D ULTRAFINE III SHORT PEN 31G X 8 MM MISC use as directed 100 each 9 Taking  . budesonide (PULMICORT FLEXHALER) 180 MCG/ACT inhaler Inhale 2 puffs into the lungs 2 (two) times daily. 1 each 11 09/16/2014 at Unknown time  . buPROPion (WELLBUTRIN XL) 150 MG 24 hr tablet take 1 tablet by mouth once daily (Patient taking differently: Take 150 mg by mouth daily. take 1 tablet by mouth once daily) 30 tablet 8 09/17/2014 at 0400  . chlorpheniramine (CHLOR-TRIMETON) 4 MG tablet  at night (Patient taking differently: Take 8 mg by mouth at bedtime.  at night) 14 tablet 0 Past Week at Unknown time  . DIGITEK 125 MCG tablet take 1/2 tablets by mouth once daily (Patient taking differently: take 1/2 tablets by mouth once daily 0.0625 mg daily) 15 tablet 9 09/17/2014 at 0400  . dofetilide (TIKOSYN) 250 MCG capsule Take 250 mcg  by mouth 2 (two) times daily.   09/17/2014 at 0400  . fluticasone (FLONASE) 50 MCG/ACT nasal spray instill 1 spray into each nostril twice a day (Patient taking differently: Place 1 spray into both nostrils daily. instill 1 spray into each nostril twice a day) 16 g 5 09/17/2014 at 0400  . insulin aspart (NOVOLOG FLEXPEN) 100 UNIT/ML FlexPen Please inject 5 units with large meal of day (Patient taking differently: Inject 5 Units into the skin See admin instructions. Please inject 5 units with large meal of day) 15 mL 11 Past Week at Unknown time  . levalbuterol (XOPENEX HFA) 45 MCG/ACT inhaler inhale 1 to 2 puffs by mouth INTO THE LUNGS every 4 hours if needed for wheezing (Patient taking differently: Inhale 1-2 puffs into the lungs every 4 (four) hours as needed  for wheezing. inhale 1 to 2 puffs by mouth INTO THE LUNGS every 4 hours if needed for wheezing) 15 g 4 09/16/2014 at Unknown time  . levalbuterol (XOPENEX) 0.63 MG/3ML nebulizer solution One vial in nebulizer four times daily as needed dx 493.00 (Patient taking differently: Take 0.63 mg by nebulization every 6 (six) hours as needed for wheezing or shortness of breath. One vial in nebulizer four times daily as needed dx 493.00) 360 mL 4 Past Month at Unknown time  . losartan (COZAAR) 50 MG tablet take 1 tablet by mouth once daily (Patient taking differently: Take 50 mg by mouth daily. take 1 tablet by mouth once daily) 90 tablet 3 09/17/2014 at 0400  . metoprolol (LOPRESSOR) 50 MG tablet take 1 tablet by mouth twice a day (Patient taking differently: Take 50 mg by mouth 2 (two) times daily. take 1 tablet by mouth twice a day) 60 tablet 11 09/17/2014 at 0400  . NEXIUM 40 MG capsule take 1 capsule by mouth once daily 30 capsule 6 09/17/2014 at 0400  . Omega-3 Fatty Acids (FISH OIL) 1200 MG CAPS Take 1 capsule by mouth daily.   Past Month at Unknown time  . polyethylene glycol powder (GLYCOLAX/MIRALAX) powder take 17GM (DISSOLVED IN WATER) by mouth once daily (Patient taking differently: Take 17 g by mouth daily. take 17GM (DISSOLVED IN WATER) by mouth once daily) 255 g 5 Past Week at Unknown time  . RA LORATADINE 10 MG tablet take 1 tablet by mouth once daily 30 tablet 5 09/17/2014 at 0400  . rosuvastatin (CRESTOR) 10 MG tablet Take 1 tablet (10 mg total) by mouth daily. 90 tablet 2 09/16/2014 at Unknown time  . spironolactone (ALDACTONE) 25 MG tablet Take 0.5 tablets (12.5 mg total) by mouth daily. 30 tablet 6 09/16/2014 at Unknown time  . torsemide (DEMADEX) 10 MG tablet take 2 tablets by mouth once daily or as directed 70 tablet 6 09/16/2014 at Unknown time  . warfarin (COUMADIN) 5 MG tablet take 1 and 1/2 tablets by mouth once daily (Patient taking differently: Take 1 to 1 and 1/2 tablets by mouth once daily ( 5 mg Mon,  Wed, Fri, and Sat;    7.5 mg Sun, Tues, Thurs)) 45 tablet 5 Past Week at Unknown time  . [DISCONTINUED] Insulin Glargine (LANTUS) 100 UNIT/ML Solostar Pen Inject 38 Units into the skin at bedtime. (Patient taking differently: Inject 40 Units into the skin daily before breakfast. ) 15 mL 12 09/17/2014 at 0400  . HYDROcodone-homatropine (HYDROMET) 5-1.5 MG/5ML syrup Take 5 mLs by mouth every 6 (six) hours as needed for cough. 240 mL 0 More than a month at Unknown time  .  Insulin Pen Needle (B-D ULTRAFINE III SHORT PEN) 31G X 8 MM MISC Use as directed 100 each 9     Assessment: CC: left knee pain admitted for total knee replacement  PMH: arthritis, T2DM, HLD, HTN, A Fib, ICD  AC: afib PTA, not s/p total knee replacement. Home warfarin was 5 mg MWFSat, 7.5 mg T/T/Sun  Current INR up to 1.72 after two doses of Coumadin. Also on enoxaparin 30 mg BID till INR > 2   Goal of Therapy:  INR 2-3 Monitor platelets by anticoagulation protocol: Yes   Plan:  -Warfarin 7.5 mg x 1 tonight -D/C Enoxaparin when INR > 2  -Daily INR -Monitor for s/sx bleeding -May resume home Coumadin dose when ready for discharge with outpatient INR check in 2-3 days post discharge  Isaac Bliss, PharmD, BCPS Clinical Pharmacist Pager [phone] 09/20/2014 1:20 PM

## 2014-09-20 NOTE — Progress Notes (Signed)
Occupational Therapy Treatment Patient Details Name: [name] MRN: [id number] DOB: 07-21-1950 Today's Date: 09/20/2014    History of present illness Pt is a 64 y/o F s/p L TKA. PT's PMH includes heel pain, obesity, CKD, asthma, trochanteric busitis of R hip, diabetic neuropathy, CHF, HTN, a fib, depression, anxiety, DM, cardiac arrest.   OT comments  Patient making great progress towards goals, continue plan of care for now. Pt overall supervision>mod I, except requires min guard assist for tub/shower transfers (for safety).    Follow Up Recommendations  No OT follow up;Supervision - Intermittent    Equipment Recommendations  None recommended by OT    Recommendations for Other Services  None at this time  Precautions / Restrictions Precautions Precautions: Fall;Knee Restrictions Weight Bearing Restrictions: Yes LLE Weight Bearing: Weight bearing as tolerated       Mobility Bed Mobility General bed mobility comments: in recliner  Transfers Overall transfer level: Needs assistance Equipment used: Rolling walker (2 wheeled) Transfers: Sit to/from Stand Sit to Stand: Supervision General transfer comment: Supervision for safety and cues for hand placement     Balance Overall balance assessment: Needs assistance Sitting-balance support: No upper extremity supported;Feet supported Sitting balance-Leahy Scale: Good     Standing balance support: Bilateral upper extremity supported;During functional activity Standing balance-Leahy Scale: Good   ADL Overall ADL's : Needs assistance/impaired General ADL Comments: Pt overall supervision > mod I with ADLs and functional mobility/transfers using RW. Pt performed toilet transfer using BSC over toilet and tub/shower transfer (stepping over tub threshold). Pt min guard assist for tub/shower transfer for safety only. Pt states she will have family and friends assisting her prn post acute d/c. No follow-up recommended at this time.       Cognition   Behavior During Therapy: WFL for tasks assessed/performed Overall Cognitive Status: Within Functional Limits for tasks assessed  Pertinent Vitals/ Pain       Pain Assessment: No/denies pain         Frequency Min 2X/week     Progress Toward Goals  OT Goals(current goals can now be found in the care plan section)  Progress towards OT goals: Progressing toward goals     Plan Discharge plan remains appropriate    End of Session Equipment Utilized During Treatment: Rolling walker   Activity Tolerance Patient tolerated treatment well   Patient Left  (in ortho gym with PT)    Time: 1415-1430 OT Time Calculation (min): 15 min  Charges: OT General Charges $OT Visit: 1 Procedure OT Treatments $Self Care/Home Management : 8-22 mins  Anum Palecek , MS, OTR/L, CLT Pager: [phone]  09/20/2014, 2:38 PM

## 2014-09-20 NOTE — Progress Notes (Signed)
Physical Therapy Treatment Patient Details Name: [name] MRN: [id number] DOB: [birth year] Today's Date: 09/20/2014    History of Present Illness Pt is a 64 y/o F s/p L TKA. PT's PMH includes heel pain, obesity, CKD, asthma, trochanteric busitis of R hip, diabetic neuropathy, CHF, HTN, a fib, depression, anxiety, DM, cardiac arrest.    PT Comments    Patient is making good progress with PT.  From a mobility standpoint anticipate patient will be ready for DC home today.  Pt demonstrated ability to ambulate 150 ft and ascend/descend 4 steps this session.  Pt will benefit from continued skilled PT services to increase functional independence and safety.   Follow Up Recommendations  Home health PT;Supervision - Intermittent     Equipment Recommendations  Cane    Recommendations for Other Services       Precautions / Restrictions Precautions Precautions: Fall;Knee Precaution Comments: Reviewed no pillow under knee Restrictions Weight Bearing Restrictions: Yes LLE Weight Bearing: Weight bearing as tolerated    Mobility  Bed Mobility               General bed mobility comments: Pt in recliner at end of session, pt in gym at start of session after finishing w/ OT  Transfers Overall transfer level: Needs assistance Equipment used: Rolling walker (2 wheeled) Transfers: Sit to/from Stand Sit to Stand: Supervision         General transfer comment: Supervision for safety  Ambulation/Gait Ambulation/Gait assistance: Supervision Ambulation Distance (Feet): 150 Feet Assistive device: Rolling walker (2 wheeled) Gait Pattern/deviations: Step-through pattern;Antalgic;Decreased stride length Gait velocity: decreased Gait velocity interpretation: Below normal speed for age/gender General Gait Details: Supervision for safety only   Stairs Stairs: Yes Stairs assistance: Min guard Stair Management: One rail Left;Two rails;Step to pattern;Sideways;Forwards Number of  Stairs: 4 General stair comments: 2 steps w/ L rail going sideways and 2 steps w/ 2 rails going foward.  Min guard for safety only.  Cues to leave enough space for both feet when ascending/descending sideways  Wheelchair Mobility    Modified Rankin (Stroke Patients Only)       Balance Overall balance assessment: Needs assistance Sitting-balance support: No upper extremity supported;Feet supported Sitting balance-Leahy Scale: Good     Standing balance support: Bilateral upper extremity supported;During functional activity Standing balance-Leahy Scale: Good                      Cognition Arousal/Alertness: Awake/alert Behavior During Therapy: WFL for tasks assessed/performed Overall Cognitive Status: Within Functional Limits for tasks assessed                      Exercises Total Joint Exercises Knee Flexion: AROM;Left;5 reps;Seated;Standing;10 reps Goniometric ROM: 66 deg L knee flexion Standing Hip Extension: AROM;Left;10 reps;Standing    General Comments        Pertinent Vitals/Pain Pain Assessment: 0-10 Pain Score: 5  Pain Location: L knee Pain Descriptors / Indicators: Aching Pain Intervention(s): Limited activity within patient's tolerance;Monitored during session;Repositioned    Home Living                      Prior Function            PT Goals (current goals can now be found in the care plan section) Acute Rehab PT Goals Patient Stated Goal: to get stronger and get back to walking PT Goal Formulation: With patient Time For Goal Achievement: 09/24/14 Potential to Achieve Goals:  Good Progress towards PT goals: Progressing toward goals    Frequency  7X/week    PT Plan Current plan remains appropriate    Co-evaluation             End of Session Equipment Utilized During Treatment: Gait belt Activity Tolerance: Patient tolerated treatment well Patient left: in chair;with call bell/phone within reach;with  family/visitor present     Time: 1430-1459 PT Time Calculation (min) (ACUTE ONLY): 29 min  Charges:  $Gait Training: 8-22 mins $Therapeutic Exercise: 8-22 mins                    G Codes:      Michail Jewels PT, [place] [phone] Pager: [phone] 09/20/2014, 3:39 PM

## 2014-09-20 NOTE — Progress Notes (Signed)
Pt being discharged home via wheelchair with family. Pt alert and oriented x4. VSS. Pt c/o no pain at this time. No signs of respiratory distress. Education complete and care plans resolved. IV removed with catheter intact and pt tolerated well. No further issues at this time. Pt to follow up with PCP. Khaleah Duer R, RN 

## 2014-09-20 NOTE — Discharge Summary (Signed)
Patient ID: Diane [name] MRN: [id number] DOB/AGE: [birth year] 63 y.o.  Admit date: 09/17/2014 Discharge date: 09/20/2014  Admission Diagnoses:  Principal Problem:   Primary localized osteoarthritis of left knee Active Problems:   Type 2 diabetes, uncontrolled, with neuropathy   HYPERCHOLESTEROLEMIA   HYPERTENSION, BENIGN SYSTEMIC   Atrial fibrillation   Sleep apnea   Chronic systolic heart failure   Diabetic neuropathy   Asthma, moderate persistent   CKD (chronic kidney disease) stage 2, GFR 60-89 ml/min   ICD (St. Jude Protecta dual-chamber),secondary prevention (VF arrest) January 2012   Obesity (BMI 30-39.9)   DJD (degenerative joint disease) of knee   Discharge Diagnoses:  Same  Past Medical History  Diagnosis Date  . Atrial fibrillation     ablation x 2 [facility], 01/2006, 2011.  on warfarin  . Hyperlipidemia   . Depression   . Anxiety   . Anemia   . Arthritis   . GERD (gastroesophageal reflux disease)   . Vocal cord disease   . Cardiac arrest jan 2012    in hospital for pneumonia when this occured- occured at the hospital  . GASTROESOPHAGEAL REFLUX, NO ESOPHAGITIS 06/10/2006    Qualifier: Diagnosis of  By: Abundio Miu    . Non-ischemic cardiomyopathy     echo 08/08/10 - EF 35-45% LV and RV systolic function mod reduced  . Limb pain 06/04/2008    LLE, Baker's cyst in popliteal fossa, no DVT  . Primary localized osteoarthritis of left knee   . History of hiatal hernia   . CHF (congestive heart failure) 2012    Echo 08/08/10 by [facility]. EF 35-45%. LV systolic function moderately reduced. Moderate global hypokinesis of LV.  RV systolic function moderately reduced. Mild MR. Trace AR.  Marland Kitchen AICD (automatic cardioverter/defibrillator) present     [employer]  . Asthma     has had multiple hospitalizations for this  . Complication of anesthesia     difficult time waking up after anesthesia  . Hypertension   . Dysrhythmia     Atrial fibrillation  . Diabetes  mellitus     Type 2  . Sleep apnea     wears CPAP nightly    Surgeries: Procedure(s): TOTAL KNEE ARTHROPLASTY on 09/17/2014   Consultants:    Discharged Condition: Improved  Hospital Course: Diane [name] is an 64 y.o. female who was [name] 09/17/2014 for operative treatment ofPrimary localized osteoarthritis of left knee. Patient has severe unremitting pain that affects sleep, daily activities, and work/hobbies. After pre-op clearance the patient was taken to the operating room on 09/17/2014 and underwent  Procedure(s): TOTAL KNEE ARTHROPLASTY.    Patient was given perioperative antibiotics: Anti-infectives    Start     Dose/Rate Route Frequency Ordered Stop   09/20/14 0000  amoxicillin-clavulanate (AUGMENTIN) 875-125 MG per tablet     1 tablet Oral Every 12 hours 09/20/14 0845     09/19/14 1130  amoxicillin-clavulanate (AUGMENTIN) 875-125 MG per tablet 1 tablet     1 tablet Oral Every 12 hours 09/19/14 1121     09/17/14 1230  ceFAZolin (ANCEF) IVPB 2 g/50 mL premix     2 g 100 mL/hr over 30 Minutes Intravenous Every 6 hours 09/17/14 1110 09/17/14 1901   09/17/14 0853  cefUROXime (ZINACEF) injection  Status:  Discontinued       As needed 09/17/14 0853 09/17/14 0913   09/17/14 0700  ceFAZolin (ANCEF) IVPB 2 g/50 mL premix     2 g 100 mL/hr  over 30 Minutes Intravenous On call to O.R. 09/16/14 1337 09/17/14 0715       Patient was given sequential compression devices, early ambulation, and chemoprophylaxis to prevent DVT.  Patient benefited maximally from hospital stay and there were no complications.    Recent vital signs: Patient Vitals for the past 24 hrs:  BP Temp Temp src Pulse Resp SpO2  09/20/14 0507 111/60 mmHg 98.1 F (36.7 C) - (!) 59 18 100 %  09/19/14 2026 (!) 112/57 mmHg 97.9 F (36.6 C) Oral 64 18 100 %  09/19/14 1253 130/72 mmHg 97.8 F (36.6 C) Oral 80 20 98 %     Recent laboratory studies:  Recent Labs  09/19/14 0506 09/20/14 0435  WBC 12.6* 10.7*  HGB  9.1* 8.9*  HCT 27.6* 27.1*  PLT 190 184  NA 132* 133*  K 4.8 5.0  CL 99* 99*  CO2 25 27  BUN 14 21*  CREATININE 0.80 1.04*  GLUCOSE 262* 377*  INR 1.47 1.72*  CALCIUM 8.1* 8.1*     Discharge Medications:     Medication List    STOP taking these medications        Fish Oil 1200 MG Caps     HYDROcodone-homatropine 5-1.5 MG/5ML syrup  Commonly known as:  HYDROMET      TAKE these medications        amoxicillin-clavulanate 875-125 MG per tablet  Commonly known as:  AUGMENTIN  Take 1 tablet by mouth every 12 (twelve) hours.     B-D ULTRAFINE III SHORT PEN 31G X 8 MM Misc  Generic drug:  Insulin Pen Needle  use as directed     Insulin Pen Needle 31G X 8 MM Misc  Commonly known as:  B-D ULTRAFINE III SHORT PEN  Use as directed     budesonide 180 MCG/ACT inhaler  Commonly known as:  PULMICORT FLEXHALER  Inhale 2 puffs into the lungs 2 (two) times daily.     buPROPion 150 MG 24 hr tablet  Commonly known as:  WELLBUTRIN XL  take 1 tablet by mouth once daily     chlorpheniramine 4 MG tablet  Commonly known as:  CHLOR-TRIMETON  8mg  at night     DIGITEK 0.125 MG tablet  Generic drug:  digoxin  take 1/2 tablets by mouth once daily     docusate sodium 100 MG capsule  Commonly known as:  COLACE  1 tab 2 times a day while on narcotics.  STOOL SOFTENER     dofetilide 250 MCG capsule  Commonly known as:  TIKOSYN  Take 250 mcg by mouth 2 (two) times daily.     fluticasone 50 MCG/ACT nasal spray  Commonly known as:  FLONASE  instill 1 spray into each nostril twice a day     insulin aspart 100 UNIT/ML FlexPen  Commonly known as:  NOVOLOG FLEXPEN  Please inject 5 units with large meal of day     Insulin Glargine 100 UNIT/ML Solostar Pen  Commonly known as:  LANTUS  Inject 60 Units into the skin at bedtime.     levalbuterol 0.63 MG/3ML nebulizer solution  Commonly known as:  XOPENEX  One vial in nebulizer four times daily as needed dx 493.00     levalbuterol 45  MCG/ACT inhaler  Commonly known as:  XOPENEX HFA  inhale 1 to 2 puffs by mouth INTO THE LUNGS every 4 hours if needed for wheezing     losartan 50 MG tablet  Commonly known as:  COZAAR  take 1 tablet by mouth once daily     metoprolol 50 MG tablet  Commonly known as:  LOPRESSOR  take 1 tablet by mouth twice a day     NEXIUM 40 MG capsule  Generic drug:  esomeprazole  take 1 capsule by mouth once daily     oxyCODONE 5 MG immediate release tablet  Commonly known as:  Oxy IR/ROXICODONE  1 tablet every 4-6 hrs as needed for break through pain.  Short acting pain medication.     OxyCODONE 20 mg T12a 12 hr tablet  Commonly known as:  OXYCONTIN  1 tablet every at 10 am and 10 pm   Long acting pain medication     polyethylene glycol powder powder  Commonly known as:  GLYCOLAX/MIRALAX  take 17GM (DISSOLVED IN WATER) by mouth once daily     RA LORATADINE 10 MG tablet  Generic drug:  loratadine  take 1 tablet by mouth once daily     rosuvastatin 10 MG tablet  Commonly known as:  CRESTOR  Take 1 tablet (10 mg total) by mouth daily.     spironolactone 25 MG tablet  Commonly known as:  ALDACTONE  Take 0.5 tablets (12.5 mg total) by mouth daily.     torsemide 10 MG tablet  Commonly known as:  DEMADEX  take 2 tablets by mouth once daily or as directed     warfarin 5 MG tablet  Commonly known as:  COUMADIN  take 1 and 1/2 tablets by mouth once daily        Diagnostic Studies: Dg Chest 2 View  09/19/2014   CLINICAL DATA:  Two days post left knee arthroplasty. Shortness of breath, asthma and elevated white blood cell count.  EXAM: CHEST - 2 VIEW  COMPARISON:  09/07/2014  FINDINGS: Stable appearance of cardiac pacing/ICD device. There is slightly more prominent density at the right lung base which is felt to most likely represent atelectasis. Early infiltrate cannot be excluded. No edema or pleural fluid is identified. The heart size is stable and normal. Visualized bony structures are  unremarkable.  IMPRESSION: More prominent density at the right lung base is felt to most likely represent atelectasis. Early infiltrate cannot be excluded.   Electronically Signed   By: Irish Lack M.D.   On: 09/19/2014 09:08   Dg Chest 2 View  09/07/2014   CLINICAL DATA:  Knee replacement.  EXAM: CHEST  2 VIEW  COMPARISON:  07/07/2013 .  FINDINGS: Mediastinum and hilar structures are normal. Lungs are clear. Cardiac pacer noted with lead tips in right atrium right ventricle. Stable cardiomegaly. No pulmonary venous congestion. No acute bony abnormality. Surgical clips upper abdomen.  IMPRESSION: No acute cardiopulmonary disease. Cardiac pacer in stable position. Stable cardiomegaly.   Electronically Signed   By: Maisie Fus  Register   On: 09/07/2014 09:28    Disposition: 01-Home or Self Care      Discharge Instructions    CPM    Complete by:  As directed   Continuous passive motion machine (CPM):      Use the CPM from 0 to 90 for 6 hours per day.       You may break it up into 2 or 3 sessions per day.      Use CPM for 2 weeks or until you are told to stop.     Call MD / Call 911    Complete by:  As directed   If you experience chest pain or  shortness of breath, CALL 911 and be transported to the hospital emergency room.  If you develope a fever above 101 F, pus (white drainage) or increased drainage or redness at the wound, or calf pain, call your surgeon's office.     Change dressing    Complete by:  As directed   Change the gauze dressing daily with sterile 4 x 4 inch gauze and apply TED hose.  DO NOT REMOVE BANDAGE OVER SURGICAL INCISION.  WASH WHOLE LEG INCLUDING OVER THE WATERPROOF BANDAGE WITH SOAP AND WATER EVERY DAY.     Constipation Prevention    Complete by:  As directed   Drink plenty of fluids.  Prune juice may be helpful.  You may use a stool softener, such as Colace (over the counter) 100 mg twice a day.  Use MiraLax (over the counter) for constipation as needed.     Diet -  low sodium heart healthy    Complete by:  As directed      Discharge instructions    Complete by:  As directed   INSTRUCTIONS AFTER JOINT REPLACEMENT   Remove items at home which could result in a fall. This includes throw rugs or furniture in walking pathways ICE to the affected joint every three hours while awake for 30 minutes at a time, for at least the first 3-5 days, and then as needed for pain and swelling.  Continue to use ice for pain and swelling. You may notice swelling that will progress down to the foot and ankle.  This is normal after surgery.  Elevate your leg when you are not up walking on it.   Continue to use the breathing machine you got in the hospital (incentive spirometer) which will help keep your temperature down.  It is common for your temperature to cycle up and down following surgery, especially at night when you are not up moving around and exerting yourself.  The breathing machine keeps your lungs expanded and your temperature down.   DIET:  As you were doing prior to hospitalization, we recommend a well-balanced diet.  DRESSING / WOUND CARE / SHOWERING  Keep the surgical dressing until follow up.  The dressing is water proof, so you can shower without any extra covering.  IF THE DRESSING FALLS OFF or the wound gets wet inside, change the dressing with sterile gauze.  Please use good hand washing techniques before changing the dressing.  Do not use any lotions or creams on the incision until instructed by your surgeon.    ACTIVITY  Increase activity slowly as tolerated, but follow the weight bearing instructions below.   No driving for 6 weeks or until further direction given by your physician.  You cannot drive while taking narcotics.  No lifting or carrying greater than 10 lbs. until further directed by your surgeon. Avoid periods of inactivity such as sitting longer than an hour when not asleep. This helps prevent blood clots.  You may return to work once you are  authorized by your doctor.     WEIGHT BEARING   Weight bearing as tolerated with assist device (cane, etc) as directed, use it as long as suggested by your surgeon or therapist, typically at least 2-4 weeks.   EXERCISES  Results after joint replacement surgery are often greatly improved when you follow the exercise, range of motion and muscle strengthening exercises prescribed by your doctor. Safety measures are also important to protect the joint from further injury. Any time any of these  exercises cause you to have increased pain or swelling, decrease what you are doing until you are comfortable again and then slowly increase them. If you have problems or questions, call your caregiver or physical therapist for advice.   Rehabilitation is important following a joint replacement. After just a few days of immobilization, the muscles of the leg can become weakened and shrink (atrophy).  These exercises are designed to build up the tone and strength of the thigh and leg muscles and to improve motion. Often times heat used for twenty to thirty minutes before working out will loosen up your tissues and help with improving the range of motion but do not use heat for the first two weeks following surgery (sometimes heat can increase post-operative swelling).   These exercises can be done on a training (exercise) mat, on the floor, on a table or on a bed. Use whatever works the best and is most comfortable for you.    Use music or television while you are exercising so that the exercises are a pleasant break in your day. This will make your life better with the exercises acting as a break in your routine that you can look forward to.   Perform all exercises about fifteen times, three times per day or as directed.  You should exercise both the operative leg and the other leg as well.   Exercises include:   Quad Sets - Tighten up the muscle on the front of the thigh (Quad) and hold for 5-10 seconds.    Straight Leg Raises - With your knee straight (if you were given a brace, keep it on), lift the leg to 60 degrees, hold for 3 seconds, and slowly lower the leg.  Perform this exercise against resistance later as your leg gets stronger.  Leg Slides: Lying on your back, slowly slide your foot toward your buttocks, bending your knee up off the floor (only go as far as is comfortable). Then slowly slide your foot back down until your leg is flat on the floor again.  Angel Wings: Lying on your back spread your legs to the side as far apart as you can without causing discomfort.  Hamstring Strength:  Lying on your back, push your heel against the floor with your leg straight by tightening up the muscles of your buttocks.  Repeat, but this time bend your knee to a comfortable angle, and push your heel against the floor.  You may put a pillow under the heel to make it more comfortable if necessary.   A rehabilitation program following joint replacement surgery can speed recovery and prevent re-injury in the future due to weakened muscles. Contact your doctor or a physical therapist for more information on knee rehabilitation.    CONSTIPATION  Constipation is defined medically as fewer than three stools per week and severe constipation as less than one stool per week.  Even if you have a regular bowel pattern at home, your normal regimen is likely to be disrupted due to multiple reasons following surgery.  Combination of anesthesia, postoperative narcotics, change in appetite and fluid intake all can affect your bowels.   YOU MUST use at least one of the following options; they are listed in order of increasing strength to get the job done.  They are all available over the counter, and you may need to use some, POSSIBLY even all of these options:    Drink plenty of fluids (prune juice may be helpful) and high fiber foods  Colace 100 mg by mouth twice a day  Senokot for constipation as directed and as needed  Dulcolax (bisacodyl), take with full glass of water  Miralax (polyethylene glycol) once or twice a day as needed.  If you have tried all these things and are unable to have a bowel movement in the first 3-4 days after surgery call either your surgeon or your primary doctor.    If you experience loose stools or diarrhea, hold the medications until you stool forms back up.  If your symptoms do not get better within 1 week or if they get worse, check with your doctor.  If you experience "the worst abdominal pain ever" or develop nausea or vomiting, please contact the office immediately for further recommendations for treatment.   ITCHING:  If you experience itching with your medications, try taking only a single pain pill, or even half a pain pill at a time.  You can also use Benadryl over the counter for itching or also to help with sleep.   TED HOSE STOCKINGS:  Use stockings on both legs until for at least 2 weeks or as directed by physician office. They may be removed at night for sleeping.  MEDICATIONS:  See your medication summary on the "After Visit Summary" that nursing will review with you.  You may have some home medications which will be placed on hold until you complete the course of blood thinner medication.  It is important for you to complete the blood thinner medication as prescribed.  PRECAUTIONS:  If you experience chest pain or shortness of breath - call 911 immediately for transfer to the hospital emergency department.   If you develop a fever greater that 101 F, purulent drainage from wound, increased redness or drainage from wound, foul odor from the wound/dressing, or calf pain - CONTACT YOUR SURGEON.                                                   FOLLOW-UP APPOINTMENTS:  If you do not already have a post-op appointment, please call the office for an appointment to be seen by your surgeon.  Guidelines for how soon to be seen are listed in your "After Visit Summary", but are  typically between 1-4 weeks after surgery.  OTHER INSTRUCTIONS:   Knee Replacement:  Do not place pillow under knee, focus on keeping the knee straight while resting. CPM instructions: 0-90 degrees, 2 hours in the morning, 2 hours in the afternoon, and 2 hours in the evening. Place foam block, curve side up under heel at all times except when in CPM or when walking.  DO NOT modify, tear, cut, or change the foam block in any way.  MAKE SURE YOU:  Understand these instructions.  Get help right away if you are not doing well or get worse.    Thank you for letting us be a part of your medical care team.  It is a privilege we respect greatly.  We hope these instructions will help you stay on track for a fast and full recovery!     Do not put a pillow under the knee. Place it under the heel.    Complete by:  As directed   Place gray foam block, curve side up under heel at all times except when in CPM or when walking.  DO  NOT modify, tear, cut, or change in any way the gray foam block.     Increase activity slowly as tolerated    Complete by:  As directed      TED hose    Complete by:  As directed   Use stockings (TED hose) for 2 weeks on both leg(s).  You may remove them at night for sleeping.           Follow-up Information    Follow up with [facility].   Why:  They will contact you to schedule home therapy visits.   Contact information:   [address] [place] [address] [phone]       Follow up with Nilda Simmer, MD On 10/01/2014.   Specialty:  Orthopedic Surgery   Why:  appt time 3:45 pm   Contact information:   [address] [place] [address] [phone]        Signed: Pascal Lux 09/20/2014, 8:45 AM

## 2014-09-21 ENCOUNTER — Ambulatory Visit (INDEPENDENT_AMBULATORY_CARE_PROVIDER_SITE_OTHER): Payer: Medicare Other | Admitting: Pharmacist Clinician (PhC)/ Clinical Pharmacy Specialist

## 2014-09-21 DIAGNOSIS — I481 Persistent atrial fibrillation: Secondary | ICD-10-CM

## 2014-09-21 DIAGNOSIS — I4819 Other persistent atrial fibrillation: Secondary | ICD-10-CM

## 2014-09-21 DIAGNOSIS — I4891 Unspecified atrial fibrillation: Secondary | ICD-10-CM | POA: Diagnosis not present

## 2014-09-21 DIAGNOSIS — I5022 Chronic systolic (congestive) heart failure: Secondary | ICD-10-CM | POA: Diagnosis not present

## 2014-09-21 DIAGNOSIS — Z7901 Long term (current) use of anticoagulants: Secondary | ICD-10-CM

## 2014-09-21 DIAGNOSIS — E1165 Type 2 diabetes mellitus with hyperglycemia: Secondary | ICD-10-CM | POA: Diagnosis not present

## 2014-09-21 DIAGNOSIS — Z471 Aftercare following joint replacement surgery: Secondary | ICD-10-CM | POA: Diagnosis not present

## 2014-09-21 DIAGNOSIS — E1140 Type 2 diabetes mellitus with diabetic neuropathy, unspecified: Secondary | ICD-10-CM | POA: Diagnosis not present

## 2014-09-21 DIAGNOSIS — I129 Hypertensive chronic kidney disease with stage 1 through stage 4 chronic kidney disease, or unspecified chronic kidney disease: Secondary | ICD-10-CM | POA: Diagnosis not present

## 2014-09-21 LAB — POCT INR: INR: 1.9

## 2014-09-25 DIAGNOSIS — I5022 Chronic systolic (congestive) heart failure: Secondary | ICD-10-CM | POA: Diagnosis not present

## 2014-09-25 DIAGNOSIS — I129 Hypertensive chronic kidney disease with stage 1 through stage 4 chronic kidney disease, or unspecified chronic kidney disease: Secondary | ICD-10-CM | POA: Diagnosis not present

## 2014-09-25 DIAGNOSIS — I4891 Unspecified atrial fibrillation: Secondary | ICD-10-CM | POA: Diagnosis not present

## 2014-09-25 DIAGNOSIS — E1140 Type 2 diabetes mellitus with diabetic neuropathy, unspecified: Secondary | ICD-10-CM | POA: Diagnosis not present

## 2014-09-25 DIAGNOSIS — E1165 Type 2 diabetes mellitus with hyperglycemia: Secondary | ICD-10-CM | POA: Diagnosis not present

## 2014-09-25 DIAGNOSIS — Z471 Aftercare following joint replacement surgery: Secondary | ICD-10-CM | POA: Diagnosis not present

## 2014-09-26 ENCOUNTER — Ambulatory Visit (INDEPENDENT_AMBULATORY_CARE_PROVIDER_SITE_OTHER): Payer: Medicare Other | Admitting: Pharmacist Clinician (PhC)/ Clinical Pharmacy Specialist

## 2014-09-26 DIAGNOSIS — Z471 Aftercare following joint replacement surgery: Secondary | ICD-10-CM | POA: Diagnosis not present

## 2014-09-26 DIAGNOSIS — I4891 Unspecified atrial fibrillation: Secondary | ICD-10-CM | POA: Diagnosis not present

## 2014-09-26 DIAGNOSIS — E1165 Type 2 diabetes mellitus with hyperglycemia: Secondary | ICD-10-CM | POA: Diagnosis not present

## 2014-09-26 DIAGNOSIS — E1140 Type 2 diabetes mellitus with diabetic neuropathy, unspecified: Secondary | ICD-10-CM | POA: Diagnosis not present

## 2014-09-26 DIAGNOSIS — I4819 Other persistent atrial fibrillation: Secondary | ICD-10-CM

## 2014-09-26 DIAGNOSIS — I5022 Chronic systolic (congestive) heart failure: Secondary | ICD-10-CM | POA: Diagnosis not present

## 2014-09-26 DIAGNOSIS — I481 Persistent atrial fibrillation: Secondary | ICD-10-CM

## 2014-09-26 DIAGNOSIS — I129 Hypertensive chronic kidney disease with stage 1 through stage 4 chronic kidney disease, or unspecified chronic kidney disease: Secondary | ICD-10-CM | POA: Diagnosis not present

## 2014-09-26 DIAGNOSIS — Z7901 Long term (current) use of anticoagulants: Secondary | ICD-10-CM

## 2014-09-26 LAB — POCT INR: INR: 1.6

## 2014-09-27 ENCOUNTER — Ambulatory Visit (INDEPENDENT_AMBULATORY_CARE_PROVIDER_SITE_OTHER): Payer: Medicare Other | Admitting: *Deleted

## 2014-09-27 ENCOUNTER — Encounter: Payer: Self-pay | Admitting: Cardiovascular Disease

## 2014-09-27 DIAGNOSIS — I5022 Chronic systolic (congestive) heart failure: Secondary | ICD-10-CM

## 2014-09-27 DIAGNOSIS — I4581 Long QT syndrome: Secondary | ICD-10-CM | POA: Diagnosis not present

## 2014-09-27 LAB — CUP PACEART REMOTE DEVICE CHECK
Brady Statistic AP VP Percent: 0.29 %
Brady Statistic AS VP Percent: 0.02 %
Date Time Interrogation Session: 20160616112138
HIGH POWER IMPEDANCE MEASURED VALUE: 361 Ohm
HighPow Impedance: 71 Ohm
Lead Channel Impedance Value: 418 Ohm
Lead Channel Impedance Value: 418 Ohm
Lead Channel Pacing Threshold Amplitude: 0.375 V
Lead Channel Pacing Threshold Amplitude: 0.625 V
Lead Channel Sensing Intrinsic Amplitude: 1.25 mV
Lead Channel Sensing Intrinsic Amplitude: 1.25 mV
Lead Channel Sensing Intrinsic Amplitude: 11.875 mV
Lead Channel Sensing Intrinsic Amplitude: 11.875 mV
Lead Channel Setting Pacing Amplitude: 2 V
Lead Channel Setting Pacing Amplitude: 2.5 V
Lead Channel Setting Sensing Sensitivity: 0.3 mV
MDC IDC MSMT BATTERY VOLTAGE: 3.03 V
MDC IDC MSMT LEADCHNL RA PACING THRESHOLD PULSEWIDTH: 0.4 ms
MDC IDC MSMT LEADCHNL RV PACING THRESHOLD PULSEWIDTH: 0.4 ms
MDC IDC SET LEADCHNL RV PACING PULSEWIDTH: 0.4 ms
MDC IDC SET ZONE DETECTION INTERVAL: 330 ms
MDC IDC SET ZONE DETECTION INTERVAL: 360 ms
MDC IDC STAT BRADY AP VS PERCENT: 97.6 %
MDC IDC STAT BRADY AS VS PERCENT: 2.09 %
MDC IDC STAT BRADY RA PERCENT PACED: 97.89 %
MDC IDC STAT BRADY RV PERCENT PACED: 0.31 %
Zone Setting Detection Interval: 270 ms
Zone Setting Detection Interval: 400 ms

## 2014-09-27 NOTE — Progress Notes (Signed)
Remote ICD transmission.   

## 2014-09-28 DIAGNOSIS — I129 Hypertensive chronic kidney disease with stage 1 through stage 4 chronic kidney disease, or unspecified chronic kidney disease: Secondary | ICD-10-CM | POA: Diagnosis not present

## 2014-09-28 DIAGNOSIS — Z471 Aftercare following joint replacement surgery: Secondary | ICD-10-CM | POA: Diagnosis not present

## 2014-09-28 DIAGNOSIS — I4891 Unspecified atrial fibrillation: Secondary | ICD-10-CM | POA: Diagnosis not present

## 2014-09-28 DIAGNOSIS — I5022 Chronic systolic (congestive) heart failure: Secondary | ICD-10-CM | POA: Diagnosis not present

## 2014-09-28 DIAGNOSIS — E1165 Type 2 diabetes mellitus with hyperglycemia: Secondary | ICD-10-CM | POA: Diagnosis not present

## 2014-09-28 DIAGNOSIS — E1140 Type 2 diabetes mellitus with diabetic neuropathy, unspecified: Secondary | ICD-10-CM | POA: Diagnosis not present

## 2014-10-01 DIAGNOSIS — E1165 Type 2 diabetes mellitus with hyperglycemia: Secondary | ICD-10-CM | POA: Diagnosis not present

## 2014-10-01 DIAGNOSIS — I5022 Chronic systolic (congestive) heart failure: Secondary | ICD-10-CM | POA: Diagnosis not present

## 2014-10-01 DIAGNOSIS — Z96652 Presence of left artificial knee joint: Secondary | ICD-10-CM | POA: Diagnosis not present

## 2014-10-01 DIAGNOSIS — Z471 Aftercare following joint replacement surgery: Secondary | ICD-10-CM | POA: Diagnosis not present

## 2014-10-01 DIAGNOSIS — I129 Hypertensive chronic kidney disease with stage 1 through stage 4 chronic kidney disease, or unspecified chronic kidney disease: Secondary | ICD-10-CM | POA: Diagnosis not present

## 2014-10-01 DIAGNOSIS — I4891 Unspecified atrial fibrillation: Secondary | ICD-10-CM | POA: Diagnosis not present

## 2014-10-01 DIAGNOSIS — E1140 Type 2 diabetes mellitus with diabetic neuropathy, unspecified: Secondary | ICD-10-CM | POA: Diagnosis not present

## 2014-10-03 DIAGNOSIS — E1140 Type 2 diabetes mellitus with diabetic neuropathy, unspecified: Secondary | ICD-10-CM | POA: Diagnosis not present

## 2014-10-03 DIAGNOSIS — I4891 Unspecified atrial fibrillation: Secondary | ICD-10-CM | POA: Diagnosis not present

## 2014-10-03 DIAGNOSIS — I5022 Chronic systolic (congestive) heart failure: Secondary | ICD-10-CM | POA: Diagnosis not present

## 2014-10-03 DIAGNOSIS — Z471 Aftercare following joint replacement surgery: Secondary | ICD-10-CM | POA: Diagnosis not present

## 2014-10-03 DIAGNOSIS — E1165 Type 2 diabetes mellitus with hyperglycemia: Secondary | ICD-10-CM | POA: Diagnosis not present

## 2014-10-03 DIAGNOSIS — I129 Hypertensive chronic kidney disease with stage 1 through stage 4 chronic kidney disease, or unspecified chronic kidney disease: Secondary | ICD-10-CM | POA: Diagnosis not present

## 2014-10-05 ENCOUNTER — Ambulatory Visit (INDEPENDENT_AMBULATORY_CARE_PROVIDER_SITE_OTHER): Payer: Medicare Other | Admitting: Pharmacist

## 2014-10-05 ENCOUNTER — Telehealth: Payer: Self-pay | Admitting: Cardiovascular Disease

## 2014-10-05 ENCOUNTER — Encounter: Payer: Self-pay | Admitting: Cardiovascular Disease

## 2014-10-05 DIAGNOSIS — Z7901 Long term (current) use of anticoagulants: Secondary | ICD-10-CM

## 2014-10-05 DIAGNOSIS — E1165 Type 2 diabetes mellitus with hyperglycemia: Secondary | ICD-10-CM | POA: Diagnosis not present

## 2014-10-05 DIAGNOSIS — I5022 Chronic systolic (congestive) heart failure: Secondary | ICD-10-CM | POA: Diagnosis not present

## 2014-10-05 DIAGNOSIS — I129 Hypertensive chronic kidney disease with stage 1 through stage 4 chronic kidney disease, or unspecified chronic kidney disease: Secondary | ICD-10-CM | POA: Diagnosis not present

## 2014-10-05 DIAGNOSIS — E1140 Type 2 diabetes mellitus with diabetic neuropathy, unspecified: Secondary | ICD-10-CM | POA: Diagnosis not present

## 2014-10-05 DIAGNOSIS — I481 Persistent atrial fibrillation: Secondary | ICD-10-CM

## 2014-10-05 DIAGNOSIS — I4891 Unspecified atrial fibrillation: Secondary | ICD-10-CM | POA: Diagnosis not present

## 2014-10-05 DIAGNOSIS — I4819 Other persistent atrial fibrillation: Secondary | ICD-10-CM

## 2014-10-05 DIAGNOSIS — Z471 Aftercare following joint replacement surgery: Secondary | ICD-10-CM | POA: Diagnosis not present

## 2014-10-05 LAB — POCT INR: INR: 1.4

## 2014-10-05 NOTE — Telephone Encounter (Signed)
Returned call to [employer] with [name].Advised patient is due INR check.Stated she will fax results to [place].

## 2014-10-05 NOTE — Telephone Encounter (Signed)
Joyce Gross called in stating that originally that pt was supposed to be discharged from home health care but the provider changed his mind and wants her to continue to be seen, so Joyce Gross wanted to know if the pt's PT INR needed to be checked again. Please call  Thanks

## 2014-10-05 NOTE — Telephone Encounter (Signed)
This encounter was created in error - please disregard.

## 2014-10-11 ENCOUNTER — Encounter: Payer: Self-pay | Admitting: Cardiology

## 2014-10-11 DIAGNOSIS — M25562 Pain in left knee: Secondary | ICD-10-CM | POA: Diagnosis not present

## 2014-10-11 DIAGNOSIS — R262 Difficulty in walking, not elsewhere classified: Secondary | ICD-10-CM | POA: Diagnosis not present

## 2014-10-17 DIAGNOSIS — M25562 Pain in left knee: Secondary | ICD-10-CM | POA: Diagnosis not present

## 2014-10-17 DIAGNOSIS — R262 Difficulty in walking, not elsewhere classified: Secondary | ICD-10-CM | POA: Diagnosis not present

## 2014-10-18 DIAGNOSIS — M25562 Pain in left knee: Secondary | ICD-10-CM | POA: Diagnosis not present

## 2014-10-18 DIAGNOSIS — R262 Difficulty in walking, not elsewhere classified: Secondary | ICD-10-CM | POA: Diagnosis not present

## 2014-10-22 ENCOUNTER — Ambulatory Visit (INDEPENDENT_AMBULATORY_CARE_PROVIDER_SITE_OTHER): Payer: Medicare Other | Admitting: Pharmacist Clinician (PhC)/ Clinical Pharmacy Specialist

## 2014-10-22 ENCOUNTER — Ambulatory Visit: Payer: Medicare Other

## 2014-10-22 DIAGNOSIS — Z7901 Long term (current) use of anticoagulants: Secondary | ICD-10-CM

## 2014-10-22 DIAGNOSIS — I4891 Unspecified atrial fibrillation: Secondary | ICD-10-CM | POA: Diagnosis not present

## 2014-10-22 DIAGNOSIS — I4819 Other persistent atrial fibrillation: Secondary | ICD-10-CM

## 2014-10-22 DIAGNOSIS — I481 Persistent atrial fibrillation: Secondary | ICD-10-CM | POA: Diagnosis not present

## 2014-10-22 DIAGNOSIS — R262 Difficulty in walking, not elsewhere classified: Secondary | ICD-10-CM | POA: Diagnosis not present

## 2014-10-22 DIAGNOSIS — M25562 Pain in left knee: Secondary | ICD-10-CM | POA: Diagnosis not present

## 2014-10-22 LAB — POCT INR: INR: 1.8

## 2014-10-24 DIAGNOSIS — M25562 Pain in left knee: Secondary | ICD-10-CM | POA: Diagnosis not present

## 2014-10-24 DIAGNOSIS — R262 Difficulty in walking, not elsewhere classified: Secondary | ICD-10-CM | POA: Diagnosis not present

## 2014-10-25 ENCOUNTER — Encounter: Payer: Self-pay | Admitting: Podiatry

## 2014-10-25 ENCOUNTER — Ambulatory Visit (INDEPENDENT_AMBULATORY_CARE_PROVIDER_SITE_OTHER): Payer: Medicare Other | Admitting: Podiatry

## 2014-10-25 VITALS — BP 107/66 | HR 84 | Resp 18

## 2014-10-25 DIAGNOSIS — B351 Tinea unguium: Secondary | ICD-10-CM | POA: Diagnosis not present

## 2014-10-25 DIAGNOSIS — M216X9 Other acquired deformities of unspecified foot: Secondary | ICD-10-CM

## 2014-10-25 DIAGNOSIS — Q828 Other specified congenital malformations of skin: Secondary | ICD-10-CM

## 2014-10-25 DIAGNOSIS — M79609 Pain in unspecified limb: Secondary | ICD-10-CM

## 2014-10-25 NOTE — Progress Notes (Signed)
Subjective:     Patient ID: [name], female   DOB: 10/28/1950, 64 y.o.   MRN: [id number]  HPIThis patient returns for diabetic foot care for her nails and calluses.  She says her calluses have become painful especially under big toe left foot and outside ball of right foot.  She has pain in calluses walking and wearing her shoes.   Review of Systems     Objective:   Physical Exam Objective: Review of past medical history, medications, social history and allergies were performed.  Vascular: Dorsalis pedis and posterior tibial pulses were palpable B/L, capillary refill was  WNL B/L, temperature gradient was WNL B/L   Skin:  No signs of symptoms of infection or ulcers on both feet.  [place] durum 5th B/L, callus IPJ hallux B/L and vamp callus right foot.    Nails: appear healthy with no signs of mycosis or infections  Sensory: Semmes Weinstein monifilament WNL   Orthopedic: Orthopedic evaluation demonstrates all joints distal t ankle have full ROM without crepitus, muscle power WNL B/L    Assessment:     Onychomycosis   Callus B/L     Plan:     Debride nails.  Debride callus B/L       

## 2014-10-26 ENCOUNTER — Ambulatory Visit: Payer: Medicare Other

## 2014-10-29 ENCOUNTER — Other Ambulatory Visit: Payer: Self-pay | Admitting: *Deleted

## 2014-10-29 DIAGNOSIS — Z96652 Presence of left artificial knee joint: Secondary | ICD-10-CM | POA: Diagnosis not present

## 2014-10-29 MED ORDER — POLYETHYLENE GLYCOL 3350 17 GM/SCOOP PO POWD: ORAL | Status: DC

## 2014-10-30 DIAGNOSIS — R262 Difficulty in walking, not elsewhere classified: Secondary | ICD-10-CM | POA: Diagnosis not present

## 2014-10-30 DIAGNOSIS — M25562 Pain in left knee: Secondary | ICD-10-CM | POA: Diagnosis not present

## 2014-11-01 ENCOUNTER — Ambulatory Visit (INDEPENDENT_AMBULATORY_CARE_PROVIDER_SITE_OTHER): Payer: Medicare Other | Admitting: Family Medicine

## 2014-11-01 ENCOUNTER — Encounter: Payer: Self-pay | Admitting: Family Medicine

## 2014-11-01 VITALS — BP 116/71 | HR 83 | Temp 97.8°F | Ht 66.0 in | Wt 192.8 lb

## 2014-11-01 DIAGNOSIS — E1140 Type 2 diabetes mellitus with diabetic neuropathy, unspecified: Secondary | ICD-10-CM

## 2014-11-01 DIAGNOSIS — E1165 Type 2 diabetes mellitus with hyperglycemia: Secondary | ICD-10-CM | POA: Diagnosis not present

## 2014-11-01 DIAGNOSIS — R262 Difficulty in walking, not elsewhere classified: Secondary | ICD-10-CM | POA: Diagnosis not present

## 2014-11-01 DIAGNOSIS — IMO0002 Reserved for concepts with insufficient information to code with codable children: Secondary | ICD-10-CM

## 2014-11-01 DIAGNOSIS — M25562 Pain in left knee: Secondary | ICD-10-CM | POA: Diagnosis not present

## 2014-11-01 LAB — POCT GLYCOSYLATED HEMOGLOBIN (HGB A1C): Hemoglobin A1C: 8.1

## 2014-11-01 NOTE — Assessment & Plan Note (Signed)
-   A1C 8.1 today, down from 8.9 at office visit on 4/27 - Recently seen by podiatry - Plans to schedule appointment with ophthalmology - Change to insulin regimen one month ago and reports better control since. Will continue current regimen of Lantus 60units daily. - Continue ARB - Follow up in 110months to recheck A1C

## 2014-11-01 NOTE — Patient Instructions (Signed)
Thank you so much for coming to visit me today! I'm so glad you are doing so well after your surgery. I'm hopeful that it will continue to get better and that you'll be moving around better soon!  Please continue to take 60units of Lantus daily. Check your blood sugars twice a day. Please let me know if you sugars start getting higher and we can adjust your insulin as needed. Your A1C was 8.1 today, down from 8.9 three months ago!  Please continue to work on diet and exercise. I know exercise is difficult with your knee, but every little bit helps!  Please follow up in 3 months so we can recheck your A1C.  Thanks again! Dr. Caroleen Hamman

## 2014-11-01 NOTE — Progress Notes (Signed)
Subjective:     Patient ID: ALEIGHNA TELLES, female   DOB: 10/15/1950, 64 y.o.   MRN: [id number]  HPI Mrs. Crivelli is a 64yo female presenting today for diabetes follow up. - A1C 8.9 on 4/27 - Currently prescribed Lantus 60units daily with breakfast. Was transitioned to this regimen during hospitalization for knee surgery in June. Notes sugars have been better since starting this regimen. - Last podiatry appointment 10/25/14 - Has not seen ophthalmology lately. Plans to schedule appointment. - Average blood sugars 120. Morning range 96-158, afternoon range 76-160. - States diet is well controlled. Exercise difficult due to recent knee surgery, but she has been trying to ambulate more this week. Will continue to work on exercise.  Review of Systems  Eyes: Negative for visual disturbance.  Musculoskeletal: Positive for arthralgias and gait problem.       Objective:   Physical Exam  Constitutional: She appears well-developed and well-nourished. No distress.  Cardiovascular: Normal rate and regular rhythm.  Exam reveals no gallop and no friction rub.   No murmur heard. Pulmonary/Chest: Effort normal. No respiratory distress. She has no wheezes. She has no rales.  Neurological:  Decreased sensation in feet bilaterally, mostly over calluses.  Skin:  Calluses noted on feet bilaterally  Psychiatric: She has a normal mood and affect. Her behavior is normal.       Assessment:     Please refer to Problem List for Assessment.    Plan:     Please refer to Problem List for Plan.

## 2014-11-05 ENCOUNTER — Ambulatory Visit (INDEPENDENT_AMBULATORY_CARE_PROVIDER_SITE_OTHER): Payer: Medicare Other | Admitting: Pharmacist Clinician (PhC)/ Clinical Pharmacy Specialist

## 2014-11-05 DIAGNOSIS — I481 Persistent atrial fibrillation: Secondary | ICD-10-CM | POA: Diagnosis not present

## 2014-11-05 DIAGNOSIS — Z7901 Long term (current) use of anticoagulants: Secondary | ICD-10-CM | POA: Diagnosis not present

## 2014-11-05 DIAGNOSIS — I4891 Unspecified atrial fibrillation: Secondary | ICD-10-CM | POA: Diagnosis not present

## 2014-11-05 DIAGNOSIS — I4819 Other persistent atrial fibrillation: Secondary | ICD-10-CM

## 2014-11-05 LAB — POCT INR: INR: 3

## 2014-11-06 DIAGNOSIS — M25562 Pain in left knee: Secondary | ICD-10-CM | POA: Diagnosis not present

## 2014-11-06 DIAGNOSIS — R262 Difficulty in walking, not elsewhere classified: Secondary | ICD-10-CM | POA: Diagnosis not present

## 2014-11-08 DIAGNOSIS — R262 Difficulty in walking, not elsewhere classified: Secondary | ICD-10-CM | POA: Diagnosis not present

## 2014-11-08 DIAGNOSIS — M25562 Pain in left knee: Secondary | ICD-10-CM | POA: Diagnosis not present

## 2014-11-12 DIAGNOSIS — R262 Difficulty in walking, not elsewhere classified: Secondary | ICD-10-CM | POA: Diagnosis not present

## 2014-11-12 DIAGNOSIS — M25562 Pain in left knee: Secondary | ICD-10-CM | POA: Diagnosis not present

## 2014-11-14 ENCOUNTER — Ambulatory Visit (INDEPENDENT_AMBULATORY_CARE_PROVIDER_SITE_OTHER): Payer: Medicare Other | Admitting: Family Medicine

## 2014-11-14 VITALS — BP 119/72 | Temp 98.0°F | Wt 194.7 lb

## 2014-11-14 DIAGNOSIS — M25562 Pain in left knee: Secondary | ICD-10-CM | POA: Diagnosis not present

## 2014-11-14 DIAGNOSIS — R300 Dysuria: Secondary | ICD-10-CM | POA: Insufficient documentation

## 2014-11-14 DIAGNOSIS — R262 Difficulty in walking, not elsewhere classified: Secondary | ICD-10-CM | POA: Diagnosis not present

## 2014-11-14 LAB — POCT URINALYSIS DIPSTICK
Bilirubin, UA: NEGATIVE
GLUCOSE UA: NEGATIVE
KETONES UA: NEGATIVE
NITRITE UA: NEGATIVE
Protein, UA: NEGATIVE
SPEC GRAV UA: 1.01
Urobilinogen, UA: 0.2
pH, UA: 5.5

## 2014-11-14 LAB — POCT UA - MICROSCOPIC ONLY

## 2014-11-14 MED ORDER — CEPHALEXIN 500 MG PO CAPS: 500.0000 mg | ORAL_CAPSULE | Freq: Three times a day (TID) | ORAL | Status: DC

## 2014-11-14 NOTE — Progress Notes (Signed)
   Subjective:    Patient ID: [name], female    DOB: [birth year], 64 y.o.   MRN: [id number]  Seen for Same day visit for   CC: dysuria   Pain urinating started 2 days ago. Pain is: burning with urination  Medications tried: no  Any antibiotics in the last 30 days: had ABX during the week June 6  More than 3 UTIs in the last 12 months: none STD exposure: no Possibly pregnant: no  Symptoms Urgency: no Frequency: no Blood in urine: no Pain in back:nono Fever: no Vaginal discharge: no Mouth Ulcers: no   Review of Systems   See HPI for ROS. Objective:  BP 119/72 mmHg  Temp(Src) 98 F (36.7 C) (Oral)  Wt 194 lb 11.2 oz (88.315 kg)  LMP 07/26/2011  General: NAD Abdomen: soft, nontender, nondistended, no hepatic or splenomegaly. Bowel sounds present, no suprapubic tenderness MSK: no cva tenderness      Assessment & Plan:  See Problem List Documentation

## 2014-11-14 NOTE — Assessment & Plan Note (Signed)
Suggestive of uncomplicated cystitis.  UA showing small leuks and trace blood with dysuria - will give keflex for 7 days  - send urine cx, if no growth in cx then will call and cancel taking ABX.

## 2014-11-14 NOTE — Patient Instructions (Signed)
Thank you for coming in,   I have sent in keflex.   If your urine culture doesn't grow a bacteria then I will call you and you can stop taking the antibiotic.   Please bring all of your medications with you to each visit.    Please feel free to call with any questions or concerns at any time, at [phone]. --Dr. Jordan Likes

## 2014-11-15 ENCOUNTER — Encounter: Payer: Self-pay | Admitting: Internal Medicine

## 2014-11-15 LAB — URINE CULTURE: Colony Count: 1000

## 2014-11-16 ENCOUNTER — Telehealth: Payer: Self-pay | Admitting: Family Medicine

## 2014-11-16 NOTE — Telephone Encounter (Signed)
Spoke with patient and informed that her urine cx was negative. She can stop taking the ABX. If she is still burning it may need a wet prep if continues to have symptoms.   Myra Rude, MD PGY-3, [facility] 11/16/2014, 8:21 AM

## 2014-11-19 DIAGNOSIS — M25562 Pain in left knee: Secondary | ICD-10-CM | POA: Diagnosis not present

## 2014-11-19 DIAGNOSIS — R262 Difficulty in walking, not elsewhere classified: Secondary | ICD-10-CM | POA: Diagnosis not present

## 2014-11-22 DIAGNOSIS — R262 Difficulty in walking, not elsewhere classified: Secondary | ICD-10-CM | POA: Diagnosis not present

## 2014-11-22 DIAGNOSIS — M25562 Pain in left knee: Secondary | ICD-10-CM | POA: Diagnosis not present

## 2014-11-26 ENCOUNTER — Ambulatory Visit (INDEPENDENT_AMBULATORY_CARE_PROVIDER_SITE_OTHER): Payer: Medicare Other | Admitting: Pharmacist Clinician (PhC)/ Clinical Pharmacy Specialist

## 2014-11-26 DIAGNOSIS — Z96652 Presence of left artificial knee joint: Secondary | ICD-10-CM | POA: Diagnosis not present

## 2014-11-26 DIAGNOSIS — I4891 Unspecified atrial fibrillation: Secondary | ICD-10-CM | POA: Diagnosis not present

## 2014-11-26 DIAGNOSIS — Z7901 Long term (current) use of anticoagulants: Secondary | ICD-10-CM | POA: Diagnosis not present

## 2014-11-26 LAB — POCT INR: INR: 5.7

## 2014-11-27 DIAGNOSIS — R262 Difficulty in walking, not elsewhere classified: Secondary | ICD-10-CM | POA: Diagnosis not present

## 2014-11-27 DIAGNOSIS — M25562 Pain in left knee: Secondary | ICD-10-CM | POA: Diagnosis not present

## 2014-11-30 DIAGNOSIS — M25562 Pain in left knee: Secondary | ICD-10-CM | POA: Diagnosis not present

## 2014-11-30 DIAGNOSIS — R262 Difficulty in walking, not elsewhere classified: Secondary | ICD-10-CM | POA: Diagnosis not present

## 2014-12-03 ENCOUNTER — Encounter: Payer: Self-pay | Admitting: Critical Care Medicine

## 2014-12-03 ENCOUNTER — Ambulatory Visit (INDEPENDENT_AMBULATORY_CARE_PROVIDER_SITE_OTHER): Payer: Medicare Other | Admitting: Pharmacist Clinician (PhC)/ Clinical Pharmacy Specialist

## 2014-12-03 ENCOUNTER — Ambulatory Visit (INDEPENDENT_AMBULATORY_CARE_PROVIDER_SITE_OTHER): Payer: Medicare Other | Admitting: Critical Care Medicine

## 2014-12-03 VITALS — BP 134/80 | HR 79 | Temp 98.4°F | Ht 66.0 in | Wt 194.2 lb

## 2014-12-03 DIAGNOSIS — I4891 Unspecified atrial fibrillation: Secondary | ICD-10-CM

## 2014-12-03 DIAGNOSIS — Z7901 Long term (current) use of anticoagulants: Secondary | ICD-10-CM | POA: Diagnosis not present

## 2014-12-03 DIAGNOSIS — J4540 Moderate persistent asthma, uncomplicated: Secondary | ICD-10-CM | POA: Diagnosis not present

## 2014-12-03 LAB — POCT INR: INR: 2.4

## 2014-12-03 NOTE — Patient Instructions (Signed)
No change in medications. Return in         4 months Dr Nestor 

## 2014-12-03 NOTE — Progress Notes (Signed)
Subjective:    Patient ID: [name], female    DOB: 1951-01-16, 64 y.o.   MRN: [id number]  HPI 12/04/2014 Chief Complaint  Patient presents with  . 4 month follow up    Breathing doing well overall.  No SOB, cough, chest tightness, CP, or wheezing.   Left knee TKR 6/6- 6/9 and did well.  No blood clots.  Now doing well.  No new issues.  No gerd symptoms.   Pt denies any significant sore throat, nasal congestion or excess secretions, fever, chills, sweats, unintended weight loss, pleurtic or exertional chest pain, orthopnea PND, or leg swelling Pt denies any increase in rescue therapy over baseline, denies waking up needing it or having any early am or nocturnal exacerbations of coughing/wheezing/or dyspnea. Pt also denies any obvious fluctuation in symptoms with  weather or environmental change or other alleviating or aggravating factors PUL ASTHMA HISTORY 12/03/2014 05/07/2014 01/04/2014 08/31/2013 06/26/2011  Symptoms 0-2 days/week 0-2 days/week 0-2 days/week 0-2 days/week Daily  Nighttime awakenings 0-2/month 0-2/month 0-2/month 0-2/month 0-2/month  Interference with activity No limitations No limitations No limitations Minor limitations Some limitations  SABA use 0-2 days/wk 0-2 days/wk 0-2 days/wk 0-2 days/wk Several times/day  Exacerbations requiring oral steroids 0-1 / year 0-1 / year 0-1 / year 0-1 / year 0-1 / year    Current Medications, Allergies, Complete Past Medical History, Past Surgical History, Family History, and Social History were reviewed in [employer] electronic medical record per todays encounter:  12/04/2014  Review of Systems  Constitutional: Negative.   HENT: Negative.  Negative for ear pain, postnasal drip, rhinorrhea, sinus pressure, sore throat, trouble swallowing and voice change.   Eyes: Negative.   Respiratory: Positive for cough. Negative for apnea, choking, chest tightness, shortness of breath, wheezing and stridor.   Cardiovascular: Negative.   Negative for chest pain, palpitations and leg swelling.  Gastrointestinal: Negative.  Negative for nausea, vomiting, abdominal pain and abdominal distention.  Genitourinary: Negative.   Musculoskeletal: Negative.  Negative for myalgias and arthralgias.  Skin: Negative.  Negative for rash.  Allergic/Immunologic: Negative.  Negative for environmental allergies and food allergies.  Neurological: Negative.  Negative for dizziness, syncope, weakness and headaches.  Hematological: Negative.  Negative for adenopathy. Does not bruise/bleed easily.  Psychiatric/Behavioral: Negative.  Negative for sleep disturbance and agitation. The patient is not nervous/anxious.        Objective:   Physical Exam Filed Vitals:   12/03/14 0948  BP: 134/80  Pulse: 79  Temp: 98.4 F (36.9 C)  TempSrc: Oral  Height: 5\' 6"  (1.676 m)  Weight: 194 lb 3.2 oz (88.089 kg)  SpO2: 99%    Gen: Pleasant, well-nourished, in no distress,  normal affect  ENT: No lesions,  mouth clear,  oropharynx clear, no postnasal drip  Neck: No JVD, no TMG, no carotid bruits  Lungs: No use of accessory muscles, no dullness to percussion, clear without rales or rhonchi  Cardiovascular: RRR, heart sounds normal, no murmur or gallops, no peripheral edema  Abdomen: soft and NT, no HSM,  BS normal  Musculoskeletal: No deformities, no cyanosis or clubbing  Neuro: alert, non focal  Skin: Warm, no lesions or rashes  No results found.        Assessment & Plan:  I personally reviewed all images and lab data in the [facility] system as well as any outside material available during this office visit and agree with the  radiology impressions.   Asthma, moderate persistent  Moderate persistent asthma  Stable at this time  Plan  Maintain inhaled steroid  Return 6 months

## 2014-12-04 DIAGNOSIS — M25562 Pain in left knee: Secondary | ICD-10-CM | POA: Diagnosis not present

## 2014-12-04 DIAGNOSIS — R262 Difficulty in walking, not elsewhere classified: Secondary | ICD-10-CM | POA: Diagnosis not present

## 2014-12-04 NOTE — Assessment & Plan Note (Signed)
Moderate persistent asthma  Stable at this time  Plan  Maintain inhaled steroid  Return 6 months

## 2014-12-06 DIAGNOSIS — M25562 Pain in left knee: Secondary | ICD-10-CM | POA: Diagnosis not present

## 2014-12-06 DIAGNOSIS — R262 Difficulty in walking, not elsewhere classified: Secondary | ICD-10-CM | POA: Diagnosis not present

## 2014-12-11 DIAGNOSIS — Z95810 Presence of automatic (implantable) cardiac defibrillator: Secondary | ICD-10-CM | POA: Diagnosis not present

## 2014-12-11 DIAGNOSIS — I480 Paroxysmal atrial fibrillation: Secondary | ICD-10-CM | POA: Diagnosis not present

## 2014-12-11 DIAGNOSIS — I10 Essential (primary) hypertension: Secondary | ICD-10-CM | POA: Diagnosis not present

## 2014-12-11 DIAGNOSIS — I4891 Unspecified atrial fibrillation: Secondary | ICD-10-CM | POA: Diagnosis not present

## 2014-12-11 DIAGNOSIS — I509 Heart failure, unspecified: Secondary | ICD-10-CM | POA: Diagnosis not present

## 2014-12-11 DIAGNOSIS — Z888 Allergy status to other drugs, medicaments and biological substances status: Secondary | ICD-10-CM | POA: Diagnosis not present

## 2014-12-11 DIAGNOSIS — Z79899 Other long term (current) drug therapy: Secondary | ICD-10-CM | POA: Diagnosis not present

## 2014-12-12 DIAGNOSIS — R262 Difficulty in walking, not elsewhere classified: Secondary | ICD-10-CM | POA: Diagnosis not present

## 2014-12-12 DIAGNOSIS — M25562 Pain in left knee: Secondary | ICD-10-CM | POA: Diagnosis not present

## 2014-12-14 DIAGNOSIS — R262 Difficulty in walking, not elsewhere classified: Secondary | ICD-10-CM | POA: Diagnosis not present

## 2014-12-14 DIAGNOSIS — M25562 Pain in left knee: Secondary | ICD-10-CM | POA: Diagnosis not present

## 2014-12-24 DIAGNOSIS — M25562 Pain in left knee: Secondary | ICD-10-CM | POA: Diagnosis not present

## 2014-12-25 ENCOUNTER — Ambulatory Visit (INDEPENDENT_AMBULATORY_CARE_PROVIDER_SITE_OTHER): Payer: Medicare Other | Admitting: *Deleted

## 2014-12-25 ENCOUNTER — Encounter: Payer: Self-pay | Admitting: Cardiovascular Disease

## 2014-12-25 ENCOUNTER — Ambulatory Visit (INDEPENDENT_AMBULATORY_CARE_PROVIDER_SITE_OTHER): Payer: Medicare Other | Admitting: Cardiovascular Disease

## 2014-12-25 VITALS — BP 112/79 | HR 84 | Resp 16 | Ht 66.0 in | Wt 195.5 lb

## 2014-12-25 DIAGNOSIS — I10 Essential (primary) hypertension: Secondary | ICD-10-CM | POA: Diagnosis not present

## 2014-12-25 DIAGNOSIS — I5022 Chronic systolic (congestive) heart failure: Secondary | ICD-10-CM | POA: Diagnosis not present

## 2014-12-25 DIAGNOSIS — I4891 Unspecified atrial fibrillation: Secondary | ICD-10-CM | POA: Diagnosis not present

## 2014-12-25 DIAGNOSIS — Z79899 Other long term (current) drug therapy: Secondary | ICD-10-CM

## 2014-12-25 DIAGNOSIS — I480 Paroxysmal atrial fibrillation: Secondary | ICD-10-CM

## 2014-12-25 DIAGNOSIS — Z7901 Long term (current) use of anticoagulants: Secondary | ICD-10-CM

## 2014-12-25 DIAGNOSIS — Z95810 Presence of automatic (implantable) cardiac defibrillator: Secondary | ICD-10-CM

## 2014-12-25 LAB — POCT INR: INR: 3.5

## 2014-12-25 NOTE — Progress Notes (Signed)
Patient ID: GREENLEE ANCHETA, female   DOB: 02-Dec-1950, 64 y.o.   MRN: [id number]     Cardiology Office Note   Date:  12/25/2014   ID:  KEONIA PASKO, DOB 01-May-1950, MRN [id number]  PCP:  Garry Heater, DO  Cardiologist:   Thurmon Fair, MD ; Hillis Range, MD  Chief Complaint  Patient presents with  . Follow-up    6 Months, pt denied chest pain and SOB      History of Present Illness: DASHAY GIESLER is a 64 y.o. female who presents for follow-up of her defibrillator and paroxysmal atrial fibrillation.  She underwent knee surgery a couple of months ago fall by. Of inactivity and problems with pain. During the last couple of months there was a remarkable spike in the overall burden of atrial fibrillation which has reached about 9% compared with a previous baseline of 1-2 percent. Atrial pacing occurs 90% of the time and ventricular pacing occurs 8.5% of the time. During the episodes of atrial fibrillation ventricular rate control is adequate with rates of 90-1 100 bpm. She is unaware of the arrhythmia when it occurs.  Presentation rhythm today was atrial paced ventricular sensed with a QTc of 4 and a 65 ms, otherwise normal.  Otherwise defibrillator interrogation shows normal function without any episodes of ventricular tachyarrhythmia and with normal lead parameters.  She has a history of moderate nonischemic cardiomyopathy (no coronary disease by cardiac catheterization January 2012), recurrent paroxysmal atrial fibrillation status post 2 ablation procedures (Dr. Orson Aloe), with history of ventricular fibrillation arrest while on treatment with diltiazem and dofetilide and a prolonged QT interval, probably related to simultaneous quinolone therapy. She has a dual-chamber Medtronic Protecta defibrillator implanted in January 2012.  She is obese and has obstructive sleep apnea, usually compliant with CPAP  Past Medical History  Diagnosis Date  . Atrial fibrillation     ablation x 2 [facility],  01/2006, 2011.  on warfarin  . Hyperlipidemia   . Depression   . Anxiety   . Anemia   . Arthritis   . GERD (gastroesophageal reflux disease)   . Vocal cord disease   . Cardiac arrest jan 2012    in hospital for pneumonia when this occured- occured at the hospital  . GASTROESOPHAGEAL REFLUX, NO ESOPHAGITIS 06/10/2006    Qualifier: Diagnosis of  By: Abundio Miu    . Non-ischemic cardiomyopathy     echo 08/08/10 - EF 35-45% LV and RV systolic function mod reduced  . Limb pain 06/04/2008    LLE, Baker's cyst in popliteal fossa, no DVT  . Primary localized osteoarthritis of left knee   . History of hiatal hernia   . CHF (congestive heart failure) 2012    Echo 08/08/10 by [facility]. EF 35-45%. LV systolic function moderately reduced. Moderate global hypokinesis of LV.  RV systolic function moderately reduced. Mild MR. Trace AR.  Marland Kitchen AICD (automatic cardioverter/defibrillator) present     Medtronic  . Asthma     has had multiple hospitalizations for this  . Complication of anesthesia     difficult time waking up after anesthesia  . Hypertension   . Dysrhythmia     Atrial fibrillation  . Diabetes mellitus     Type 2  . Sleep apnea     wears CPAP nightly    Past Surgical History  Procedure Laterality Date  . Cholecystectomy    . Knee arthroscopy Right   . Cardiac defibrillator placement  05/02/10  Medtronic Protecta XT-DR for CHF-VT, last download 04/12/12  . Paf ablation      By Dr Sampson Goon. Now sees Dr Steele Berg at [facility]  . Colonoscopy    . Tubal ligation    . Breast lumpectomy Right   . Hammer toe surgery Right   . Total knee arthroplasty Left 09/17/2014  . Total knee arthroplasty Left 09/17/2014    Procedure: TOTAL KNEE ARTHROPLASTY;  Surgeon: Salvatore Marvel, MD;  Location: [facility] OR;  Service: Orthopedics;  Laterality: Left;  pt has ICD     Current Outpatient Prescriptions  Medication Sig Dispense Refill  . B-D ULTRAFINE III SHORT PEN 31G X 8 MM MISC use as  directed 100 each 9  . budesonide (PULMICORT FLEXHALER) 180 MCG/ACT inhaler Inhale 2 puffs into the lungs 2 (two) times daily. 1 each 11  . buPROPion (WELLBUTRIN XL) 150 MG 24 hr tablet take 1 tablet by mouth once daily (Patient taking differently: Take 150 mg by mouth daily. take 1 tablet by mouth once daily) 30 tablet 8  . chlorpheniramine (CHLOR-TRIMETON) 4 MG tablet 8mg  at night (Patient taking differently: Take 8 mg by mouth at bedtime. 8mg  at night) 14 tablet 0  . DIGITEK 125 MCG tablet take 1/2 tablets by mouth once daily (Patient taking differently: take 1/2 tablets by mouth once daily 0.0625 mg daily) 15 tablet 9  . dofetilide (TIKOSYN) 250 MCG capsule Take 250 mcg by mouth 2 (two) times daily.    . fluticasone (FLONASE) 50 MCG/ACT nasal spray instill 1 spray into each nostril twice a day (Patient taking differently: Place 1 spray into both nostrils 2 (two) times daily. instill 1 spray into each nostril twice a day) 16 g 5  . Insulin Glargine (LANTUS) 100 UNIT/ML Solostar Pen Inject 60 Units into the skin at bedtime. 15 mL 12  . Insulin Pen Needle (B-D ULTRAFINE III SHORT PEN) 31G X 8 MM MISC Use as directed 100 each 9  . levalbuterol (XOPENEX HFA) 45 MCG/ACT inhaler inhale 1 to 2 puffs by mouth INTO THE LUNGS every 4 hours if needed for wheezing (Patient taking differently: Inhale 1-2 puffs into the lungs every 4 (four) hours as needed for wheezing. inhale 1 to 2 puffs by mouth INTO THE LUNGS every 4 hours if needed for wheezing) 15 g 4  . levalbuterol (XOPENEX) 0.63 MG/3ML nebulizer solution One vial in nebulizer four times daily as needed dx 493.00 (Patient taking differently: Take 0.63 mg by nebulization every 6 (six) hours as needed for wheezing or shortness of breath. One vial in nebulizer four times daily as needed dx 493.00) 360 mL 4  . losartan (COZAAR) 50 MG tablet take 1 tablet by mouth once daily (Patient taking differently: Take 50 mg by mouth daily. take 1 tablet by mouth once  daily) 90 tablet 3  . metoprolol (LOPRESSOR) 50 MG tablet take 1 tablet by mouth twice a day (Patient taking differently: Take 50 mg by mouth 2 (two) times daily. take 1 tablet by mouth twice a day) 60 tablet 11  . NEXIUM 40 MG capsule take 1 capsule by mouth once daily 30 capsule 6  . polyethylene glycol powder (GLYCOLAX/MIRALAX) powder take 17GM (DISSOLVED IN WATER) by mouth once daily 255 g 5  . RA LORATADINE 10 MG tablet take 1 tablet by mouth once daily 30 tablet 5  . rosuvastatin (CRESTOR) 10 MG tablet Take 1 tablet (10 mg total) by mouth daily. 90 tablet 2  . spironolactone (ALDACTONE) 25 MG tablet Take  0.5 tablets (12.5 mg total) by mouth daily. 30 tablet 6  . torsemide (DEMADEX) 10 MG tablet take 2 tablets by mouth once daily or as directed 70 tablet 6  . warfarin (COUMADIN) 5 MG tablet take 1 and 1/2 tablets by mouth once daily (Patient taking differently: Take 1 to 1 and 1/2 tablets by mouth once daily ( 5 mg Mon, Wed, Fri, and Sat;    7.5 mg Sun, Tues, Thurs)) 45 tablet 5   No current facility-administered medications for this visit.    Allergies:   Avelox; Simvastatin; Ace inhibitors; and Latex    Social History:  The patient  reports that she has never smoked. She has never used smokeless tobacco. She reports that she does not drink alcohol or use illicit drugs.   Family History:  The patient's family history includes Diabetes in her father; Hypertension in her father.    ROS:  Please see the history of present illness.    Otherwise, review of systems positive for none.   All other systems are reviewed and negative.    PHYSICAL EXAM: VS:  BP 112/79 mmHg  Pulse 84  Ht 5\' 6"  (1.676 m)  Wt 195 lb 8 oz (88.678 kg)  BMI 31.57 kg/m2  LMP 07/26/2011 , BMI Body mass index is 31.57 kg/(m^2).  General: Alert, oriented x3, no distress Head: no evidence of trauma, PERRL, EOMI, no exophtalmos or lid lag, no myxedema, no xanthelasma; normal ears, nose and oropharynx Neck: normal  jugular venous pulsations and no hepatojugular reflux; brisk carotid pulses without delay and no carotid bruits Chest: clear to auscultation, no signs of consolidation by percussion or palpation, normal fremitus, symmetrical and full respiratory excursions, healthy left subclavian defibrillator site Cardiovascular: normal position and quality of the apical impulse, regular rhythm, normal first and second heart sounds, no murmurs, rubs or gallops Abdomen: no tenderness or distention, no masses by palpation, no abnormal pulsatility or arterial bruits, normal bowel sounds, no hepatosplenomegaly Extremities: no clubbing, cyanosis or edema; 2+ radial, ulnar and brachial pulses bilaterally; 2+ right femoral, posterior tibial and dorsalis pedis pulses; 2+ left femoral, posterior tibial and dorsalis pedis pulses; no subclavian or femoral bruits Neurological: grossly nonfocal Psych: euthymic mood, full affect   EKG:  EKG is ordered today. The ekg ordered today demonstrates atrial paced ventricular sensed rhythm with QTC 465 ms otherwise normal   Recent Labs: 09/07/2014: ALT 29 09/20/2014: BUN 21*; Creatinine, Ser 1.04*; Hemoglobin 8.9*; Platelets 184; Potassium 5.0; Sodium 133*    Lipid Panel    Component Value Date/Time   CHOL 130 08/30/2013 0824   TRIG 96 08/30/2013 0824   HDL 43 08/30/2013 0824   CHOLHDL 3.0 08/30/2013 0824   VLDL 19 08/30/2013 0824   LDLCALC 68 08/30/2013 0824   LDLDIRECT 94 12/07/2007 2019      Wt Readings from Last 3 Encounters:  12/25/14 195 lb 8 oz (88.678 kg)  12/03/14 194 lb 3.2 oz (88.089 kg)  11/14/14 194 lb 11.2 oz (88.315 kg)      ASSESSMENT AND PLAN:  1. Atrial fibrillation, paroxysmal Status post initial 2012 radiofrequency ablation with recurrence of atrial fibrillation and repeat ablation in 2013. Now on Tikosyn, metoprolol and digoxin with occasional brief and asymptomatic recurrences of paroxysmal atrial fibrillation. Throughout the year, arrhythmia  burden was low (1.1%), increased to roughly 9% over the last several weeks, possibly related to knee surgery and pain. The burden appears to be gradually decreasing back to baseline.. She should remain on lifelong  anticoagulation. During episodes of atrial fibrillation rate control is acceptable. QTc interval is 465 ms, within the desirable range.  She was reminded again of the risk of drug interactions with dofetilide and warfarin. 2. Chronic systolic heart failure EF 35-40% She is well compensated heart failure. Her optivol tracks in the normal range. NYHA functional class II. She appears to be clinically euvolemic. She is on chronic loop diuretic therapy, as well as appropriate angiotensin receptor blocker and beta blocker therapy and aldactone.  3. APNEA, SLEEP  Compliance with CPAP will have impact on the arrhythmia burden 4. HYPERTENSION, BENIGN SYSTEMIC  Good control  6. ICD (St. Jude Protecta dual-chamber),secondary prevention (VF arrest) January 2012  Monitored remotely via the [facility] system. Normal device function. No discharges. Records recurrent episodes of asymptomatic paroxysmal atrial fibrillation. Atrial pacing roughly 90%, rare ventricular pacing.     Current medicines are reviewed at length with the patient today.  The patient does not have concerns regarding medicines.  The following changes have been made:  no change  Labs/ tests ordered today include:  Orders Placed This Encounter  Procedures  . Basic metabolic panel  . EKG 12-Lead     Patient Instructions  Remote monitoring is used to monitor your ICD from home. This monitoring reduces the number of office visits required to check your device to one time per year. It allows [place] to monitor the functioning of your device to ensure it is working properly. You are scheduled for a device check from home on March 27, 2015. You may send your transmission at any time that day. If you have a wireless device, the  transmission will be sent automatically. After your physician reviews your transmission, you will receive a postcard with your next transmission date.  Dr. Royann Shivers recommends that you schedule a follow-up appointment in: 6 MONTHS WITH DEFIB CHECK (MDT - BLUE).        Joie Bimler, MD  12/25/2014 2:18 PM    Thurmon Fair, MD, [facility] [phone] office [phone] pager

## 2014-12-25 NOTE — Patient Instructions (Signed)
Remote monitoring is used to monitor your ICD from home. This monitoring reduces the number of office visits required to check your device to one time per year. It allows [place] to monitor the functioning of your device to ensure it is working properly. You are scheduled for a device check from home on March 27, 2015. You may send your transmission at any time that day. If you have a wireless device, the transmission will be sent automatically. After your physician reviews your transmission, you will receive a postcard with your next transmission date.  Dr. Royann Shivers recommends that you schedule a follow-up appointment in: 6 MONTHS WITH DEFIB CHECK (MDT - BLUE).

## 2014-12-27 ENCOUNTER — Other Ambulatory Visit: Payer: Self-pay | Admitting: *Deleted

## 2014-12-27 MED ORDER — LOSARTAN POTASSIUM 50 MG PO TABS: ORAL_TABLET | ORAL | Status: DC

## 2015-01-02 ENCOUNTER — Other Ambulatory Visit: Payer: Self-pay | Admitting: Cardiovascular Disease

## 2015-01-02 NOTE — Telephone Encounter (Signed)
REFILL 

## 2015-01-03 ENCOUNTER — Other Ambulatory Visit: Payer: Self-pay | Admitting: *Deleted

## 2015-01-03 ENCOUNTER — Other Ambulatory Visit: Payer: Self-pay | Admitting: Cardiovascular Disease

## 2015-01-03 MED ORDER — METOPROLOL TARTRATE 50 MG PO TABS: ORAL_TABLET | ORAL | Status: DC

## 2015-01-07 ENCOUNTER — Ambulatory Visit (INDEPENDENT_AMBULATORY_CARE_PROVIDER_SITE_OTHER): Payer: Medicare Other | Admitting: Pharmacist Clinician (PhC)/ Clinical Pharmacy Specialist

## 2015-01-07 DIAGNOSIS — I4891 Unspecified atrial fibrillation: Secondary | ICD-10-CM

## 2015-01-07 DIAGNOSIS — Z7901 Long term (current) use of anticoagulants: Secondary | ICD-10-CM

## 2015-01-07 LAB — POCT INR: INR: 1.4

## 2015-01-09 ENCOUNTER — Other Ambulatory Visit: Payer: Self-pay | Admitting: Cardiovascular Disease

## 2015-01-09 NOTE — Telephone Encounter (Signed)
Rx(s) sent to pharmacy electronically.  

## 2015-01-13 LAB — CUP PACEART INCLINIC DEVICE CHECK
Battery Voltage: 3.01 V
Brady Statistic AP VP Percent: 7.87 %
Brady Statistic AP VS Percent: 81.69 %
Brady Statistic AS VS Percent: 9.83 %
HighPow Impedance: 399 Ohm
HighPow Impedance: 71 Ohm
Lead Channel Impedance Value: 456 Ohm
Lead Channel Pacing Threshold Amplitude: 0.375 V
Lead Channel Pacing Threshold Amplitude: 0.625 V
Lead Channel Pacing Threshold Pulse Width: 0.4 ms
Lead Channel Sensing Intrinsic Amplitude: 11.75 mV
Lead Channel Sensing Intrinsic Amplitude: 2 mV
Lead Channel Sensing Intrinsic Amplitude: 2 mV
Lead Channel Setting Pacing Amplitude: 2.5 V
Lead Channel Setting Pacing Pulse Width: 0.4 ms
MDC IDC MSMT LEADCHNL RA IMPEDANCE VALUE: 418 Ohm
MDC IDC MSMT LEADCHNL RA PACING THRESHOLD PULSEWIDTH: 0.4 ms
MDC IDC MSMT LEADCHNL RV SENSING INTR AMPL: 11.75 mV
MDC IDC SESS DTM: 20160913135842
MDC IDC SET LEADCHNL RA PACING AMPLITUDE: 2 V
MDC IDC SET LEADCHNL RV SENSING SENSITIVITY: 0.3 mV
MDC IDC SET ZONE DETECTION INTERVAL: 270 ms
MDC IDC SET ZONE DETECTION INTERVAL: 330 ms
MDC IDC SET ZONE DETECTION INTERVAL: 360 ms
MDC IDC STAT BRADY AS VP PERCENT: 0.6 %
MDC IDC STAT BRADY RA PERCENT PACED: 89.56 %
MDC IDC STAT BRADY RV PERCENT PACED: 8.48 %
Zone Setting Detection Interval: 400 ms

## 2015-01-14 ENCOUNTER — Ambulatory Visit (INDEPENDENT_AMBULATORY_CARE_PROVIDER_SITE_OTHER): Payer: Medicare Other | Admitting: Family Medicine

## 2015-01-14 VITALS — BP 128/80 | HR 84 | Temp 97.9°F | Ht 66.0 in | Wt 189.0 lb

## 2015-01-14 DIAGNOSIS — E1140 Type 2 diabetes mellitus with diabetic neuropathy, unspecified: Secondary | ICD-10-CM | POA: Diagnosis present

## 2015-01-14 DIAGNOSIS — E1165 Type 2 diabetes mellitus with hyperglycemia: Secondary | ICD-10-CM | POA: Diagnosis not present

## 2015-01-14 DIAGNOSIS — Z23 Encounter for immunization: Secondary | ICD-10-CM | POA: Diagnosis not present

## 2015-01-14 DIAGNOSIS — IMO0002 Reserved for concepts with insufficient information to code with codable children: Secondary | ICD-10-CM

## 2015-01-14 LAB — POCT GLYCOSYLATED HEMOGLOBIN (HGB A1C): Hemoglobin A1C: 9.9

## 2015-01-14 MED ORDER — FLUTICASONE PROPIONATE 50 MCG/ACT NA SUSP: NASAL | Status: DC

## 2015-01-14 MED ORDER — INSULIN GLARGINE 100 UNIT/ML SOLOSTAR PEN
50.0000 [IU] | PEN_INJECTOR | Freq: Every day | SUBCUTANEOUS | Status: DC
Start: 2015-01-14 — End: 2015-09-27

## 2015-01-14 MED ORDER — INSULIN ASPART 100 UNIT/ML FLEXPEN: PEN_INJECTOR | SUBCUTANEOUS | Status: DC

## 2015-01-14 NOTE — Progress Notes (Signed)
Subjective:     Patient ID: [name], female   DOB: October 18, 1950, 64 y.o.   MRN: [id number]  HPI [name] is a 64yo female presenting today for diabetes management. - Currently on Lantus 60units daily. Was initiated on this regimen during hospitalization with by surgery team. - Last A1C 8.1 in 10/2014 - Reports weight loss with exercise and diet. Has been walking 30-35minutes at least five days a week - Has not seen ophthalmology in last year. Plans to schedule appointment with them - Notes calluses on feet bilaterally. Painful. Plans to schedule appointment with Podiatry. Last office visit with Podiatry in 10/2014 - CBG have been 104-140 ins mornings (average 120s) and 92-160 before bed (average 130)  Review of Systems Per HPI    Objective:   Physical Exam  Constitutional: She appears well-developed and well-nourished. No distress.  HENT:  Head: Normocephalic and atraumatic.  Cardiovascular: Normal rate and regular rhythm.  Exam reveals no gallop and no friction rub.   No murmur heard. Pulmonary/Chest: Effort normal. No respiratory distress. She has no wheezes. She has no rales.  Abdominal: Soft. Bowel sounds are normal. She exhibits no distension. There is no tenderness.  Musculoskeletal: She exhibits no edema.  Skin:  Calluses noted on feet bilaterally, tender to palpation  Psychiatric: She has a normal mood and affect. Her behavior is normal.       Assessment and Plan:     Type 2 diabetes, uncontrolled, with neuropathy - Discussed with Pharmacy. To schedule appointment with Pharmacy. - Instructed to check blood sugar 1-2 hours after largest meal of day. - Will decreased Lantus to 50 units daily. Initiate Novolog at 15units after largest meal of day. - Continue diet and exercise.  - Schedule appointments with Ophthalmology and Podiatry. - Follow up in three months to recheck A1C

## 2015-01-14 NOTE — Assessment & Plan Note (Signed)
-   Discussed with Pharmacy. To schedule appointment with Pharmacy. - Instructed to check blood sugar 1-2 hours after largest meal of day. - Will decreased Lantus to 50 units daily. Initiate Novolog at 15units after largest meal of day. - Continue diet and exercise.  - Schedule appointments with Ophthalmology and Podiatry. - Follow up in three months to recheck A1C

## 2015-01-14 NOTE — Patient Instructions (Signed)
Thank you so much for coming to visit [place] today! You are down three pounds today, so I can tell you have been working really hard on your diet and exercise! Keep up the good work! Your A1C is increased today to 9.9 so we will change up your insulin regimen. Please take 50 units of the Lantus daily. Please take 15units of the Novolog after your biggest meal. You may check your sugar 1hour after your meal. You may also schedule an appointment with Raymondo Band, our pharmacist, so you can discuss your diabetic regimen. Follow up with me in three months so we can recheck your A1C. Please schedule an appointment with your eye doctor and podiatrist.   Thanks again! Dr. Caroleen Hamman

## 2015-01-21 ENCOUNTER — Ambulatory Visit (INDEPENDENT_AMBULATORY_CARE_PROVIDER_SITE_OTHER): Payer: Medicare Other | Admitting: Pharmacist Clinician (PhC)/ Clinical Pharmacy Specialist

## 2015-01-21 ENCOUNTER — Other Ambulatory Visit: Payer: Self-pay | Admitting: *Deleted

## 2015-01-21 DIAGNOSIS — I4891 Unspecified atrial fibrillation: Secondary | ICD-10-CM

## 2015-01-21 DIAGNOSIS — Z7901 Long term (current) use of anticoagulants: Secondary | ICD-10-CM | POA: Diagnosis not present

## 2015-01-21 LAB — POCT INR: INR: 1.4

## 2015-01-22 ENCOUNTER — Encounter: Payer: Self-pay | Admitting: Podiatry

## 2015-01-22 ENCOUNTER — Ambulatory Visit (INDEPENDENT_AMBULATORY_CARE_PROVIDER_SITE_OTHER): Payer: Medicare Other | Admitting: Podiatry

## 2015-01-22 DIAGNOSIS — M79609 Pain in unspecified limb: Secondary | ICD-10-CM

## 2015-01-22 DIAGNOSIS — B351 Tinea unguium: Secondary | ICD-10-CM | POA: Diagnosis not present

## 2015-01-22 DIAGNOSIS — L84 Corns and callosities: Secondary | ICD-10-CM | POA: Diagnosis not present

## 2015-01-22 DIAGNOSIS — M79673 Pain in unspecified foot: Secondary | ICD-10-CM

## 2015-01-22 MED ORDER — POLYETHYLENE GLYCOL 3350 17 GM/SCOOP PO POWD: ORAL | Status: DC

## 2015-01-22 NOTE — Progress Notes (Signed)
Subjective:     Patient ID: [name], female   DOB: Oct 07, 1950, 64 y.o.   MRN: [id number]  HPIThis patient returns for diabetic foot care for her nails and calluses.  She says her calluses have become painful especially under big toe left foot and outside ball of right foot.  She has pain in calluses walking and wearing her shoes.   Review of Systems     Objective:   Physical Exam Objective: Review of past medical history, medications, social history and allergies were performed.  Vascular: Dorsalis pedis and posterior tibial pulses were palpable B/L, capillary refill was  WNL B/L, temperature gradient was WNL B/L   Skin:  No signs of symptoms of infection or ulcers on both feet.  [place] durum 5th B/L, callus IPJ hallux B/L and vamp callus right foot.    Nails: appear healthy with no signs of mycosis or infections  Sensory: Semmes Weinstein monifilament WNL   Orthopedic: Orthopedic evaluation demonstrates all joints distal t ankle have full ROM without crepitus, muscle power WNL B/L    Assessment:     Onychomycosis   Callus B/L     Plan:     Debride nails.  Debride callus B/L

## 2015-01-23 ENCOUNTER — Encounter: Payer: Self-pay | Admitting: Cardiovascular Disease

## 2015-01-31 ENCOUNTER — Ambulatory Visit: Payer: Medicare Other | Admitting: Podiatry

## 2015-02-01 ENCOUNTER — Other Ambulatory Visit: Payer: Self-pay | Admitting: Critical Care Medicine

## 2015-02-04 ENCOUNTER — Ambulatory Visit (INDEPENDENT_AMBULATORY_CARE_PROVIDER_SITE_OTHER): Payer: Medicare Other | Admitting: Pharmacist Clinician (PhC)/ Clinical Pharmacy Specialist

## 2015-02-04 DIAGNOSIS — I4891 Unspecified atrial fibrillation: Secondary | ICD-10-CM | POA: Diagnosis not present

## 2015-02-04 DIAGNOSIS — Z7901 Long term (current) use of anticoagulants: Secondary | ICD-10-CM

## 2015-02-04 LAB — POCT INR: INR: 2.9

## 2015-02-07 ENCOUNTER — Telehealth: Payer: Self-pay | Admitting: Critical Care Medicine

## 2015-02-07 ENCOUNTER — Other Ambulatory Visit: Payer: Self-pay | Admitting: Critical Care Medicine

## 2015-02-07 MED ORDER — ESOMEPRAZOLE MAGNESIUM 40 MG PO CPDR: 40.0000 mg | DELAYED_RELEASE_CAPSULE | Freq: Every day | ORAL | Status: DC

## 2015-02-07 NOTE — Telephone Encounter (Signed)
Rx was refilled  Spoke with the pt and notified that rx for nexium refilled and needs to keep appt with [facility] for future refills

## 2015-02-14 ENCOUNTER — Encounter: Payer: Self-pay | Admitting: Pharmacist

## 2015-02-14 ENCOUNTER — Ambulatory Visit (INDEPENDENT_AMBULATORY_CARE_PROVIDER_SITE_OTHER): Payer: Medicare Other | Admitting: Pharmacist

## 2015-02-14 VITALS — BP 122/69 | HR 66 | Ht 66.0 in | Wt 196.7 lb

## 2015-02-14 DIAGNOSIS — E1165 Type 2 diabetes mellitus with hyperglycemia: Secondary | ICD-10-CM | POA: Diagnosis not present

## 2015-02-14 DIAGNOSIS — I4891 Unspecified atrial fibrillation: Secondary | ICD-10-CM | POA: Diagnosis not present

## 2015-02-14 DIAGNOSIS — I5022 Chronic systolic (congestive) heart failure: Secondary | ICD-10-CM | POA: Diagnosis not present

## 2015-02-14 DIAGNOSIS — I10 Essential (primary) hypertension: Secondary | ICD-10-CM | POA: Diagnosis not present

## 2015-02-14 DIAGNOSIS — E7800 Pure hypercholesterolemia, unspecified: Secondary | ICD-10-CM

## 2015-02-14 DIAGNOSIS — E1140 Type 2 diabetes mellitus with diabetic neuropathy, unspecified: Secondary | ICD-10-CM

## 2015-02-14 DIAGNOSIS — IMO0002 Reserved for concepts with insufficient information to code with codable children: Secondary | ICD-10-CM

## 2015-02-14 LAB — LIPID PANEL
CHOL/HDL RATIO: 2.5 ratio (ref <=5.0)
CHOLESTEROL: 117 mg/dL — AB (ref 125–200)
HDL: 47 mg/dL (ref >=46)
LDL CALC: 52 mg/dL (ref <130)
TRIGLYCERIDES: 89 mg/dL (ref <150)
VLDL: 18 mg/dL (ref <30)

## 2015-02-14 NOTE — Assessment & Plan Note (Signed)
Hyperlipidemia:  Most recent lipid panel in May 2015 with LDL of 68.  Patient is currently on rosuvastatin 10mg  daily.  High intensity statin therapy is indicated due to diabetes and calculated 10-year ASCVD risk of 15.4%.  Will recheck lipid panel today and consider increasing rosuvastatin to 20mg  daily at next visit.

## 2015-02-14 NOTE — Assessment & Plan Note (Signed)
Atrial fibrillation:  Patient is anticoagulated with warfarin.  Patient reports no discussion of changing to an alternative anticoagulant (apixaban 5mg  BID).   Consider at next [facility] or cardiologist visit.

## 2015-02-14 NOTE — Assessment & Plan Note (Signed)
Diabetes currently well controlled.   Patient had hypoglycemic events with prior Novolog dose but reports she has not had hypoglycemia since decreasing dose to 10 units.  Patient is able to verbalize appropriate hypoglycemia management plan.  Patient reports adherence with medication. Continued basal insulin Lantus (insulin glargine) 50 units daily. Continued rapid insulin Novolog (insulin aspart) 10 units before biggest meal of day (lunch). Discussed initiating metformin; however, patient reports previous intolerance to metformin hcl with GI upset.  Patient to continue to monitor BG twice daily.  Next A1C anticipated in 4 to 6 weeks.

## 2015-02-14 NOTE — Assessment & Plan Note (Signed)
Heart failure:  Patient appears compensated with no edema at this time.  Patient is currently on torsemide, losartan, and metoprolol tartrate.  Metoprolol succinated is indicated in patient with LVSD.  Discussed transitioning patient from metoprolol tartrate to metoprolol succinate; however, patient wishes to talk to cardiologist before change is made.

## 2015-02-14 NOTE — Progress Notes (Signed)
S:    Patient arrives in good spirits, ambulating well.  Presents for diabetes follow up. Patient reports adherence with medications. Current diabetes medications include Lantus 50 units daily and Novolog 10 units before lunch. Patient was on Novolog 15 units with lunch, but reports hypoglycemic events with BGs in the 60s after using 15 units. Patient self reduced Novolog 10 10 units before lunch.  Since patient has been taking the 10 units of Novolog BG readings have been 90-130 in the AM, and 140-190 in the PM.  Patient reported dietary habits: Reports adhering to a diabetic diet  Patient reported exercise habits: walks 35 minutes four times per week    Patient reports nocturia (3 times per night).  Patient denies neuropathy.  Patient reports visual changes during hypoglycemia.  Has annual eye exam scheduled next week.  Patient reports self foot exams.   Patient reports some episodes of edema over the past week which required her to take an extra dose of torsemide per cardiology instructions.  Patient denies edema today, and no edema noted upon exam.     O:  Lab Results  Component Value Date   HGBA1C 9.9 01/14/2015    Home fasting CBG: morning -  90 to 130mg /dL             Evening - 096 to 189mg /dL   A/P: Diabetes currently well controlled.   Patient had hypoglycemic events with prior Novolog dose but reports she has not had hypoglycemia since decreasing dose to 10 units.  Patient is able to verbalize appropriate hypoglycemia management plan.  Patient reports adherence with medication. Continued basal insulin Lantus (insulin glargine) 50 units daily. Continued rapid insulin Novolog (insulin aspart) 10 units before biggest meal of day (lunch). Discussed initiating metformin; however, patient reports previous intolerance to metformin hcl with GI upset.  Patient to continue to monitor BG twice daily.  Next A1C anticipated in 4 to 6 weeks.    Hypertension:  Blood pressure at goal on regimen of  losartan, metoprolol tartrate, spironolactone, and torsemide.    Heart failure:  Patient appears compensated with no edema at this time.  Patient is currently on torsemide, losartan, and metoprolol tartrate.  Metoprolol succinated is indicated in patient with LVSD.  Discussed transitioning patient from metoprolol tartrate to metoprolol succinate; however, patient wishes to talk to cardiologist before change is made.    Hyperlipidemia:  Most recent lipid panel in May 2015 with LDL of 68.  Patient is currently on rosuvastatin 10mg  daily.  High intensity statin therapy is indicated due to diabetes and calculated 10-year ASCVD risk of 15.4%.  Will recheck lipid panel today and consider increasing rosuvastatin at next visit.    Atrial fibrillation:  Patient is anticoagulated with warfarin.  Patient reports no discussion of changing to an alternative anticoagulant (apixaban 5mg  BID).     Written patient instructions provided.  Total time in face to face counseling 30 minutes.  Follow up with Dr. Caroleen Hamman in 4 to 6 weeks.   Patient seen with Renata Caprice, PharmD Candidate, Sandi Carne, PharmD Resident.and Lilla Shook, PharmD, Resident. Marland Kitchen

## 2015-02-14 NOTE — Assessment & Plan Note (Signed)
Hypertension:  Blood pressure at goal on regimen of losartan, metoprolol tartrate, spironolactone, and torsemide.

## 2015-02-14 NOTE — Progress Notes (Signed)
Patient ID: [name], female   DOB: 1950-11-16, 64 y.o.   MRN: [id number] Reviewed: Agree with Dr. Macky Lower documentation and management.

## 2015-02-14 NOTE — Patient Instructions (Signed)
Thank you for coming in today!   Please stop by the lab on your way out.   Continue Lantus 50 units daily and Novolog 10 units before lunch.  Keep checking blood sugar twice a day.   Please schedule an appointment with Dr. Caroleen Hamman in 4 to 6 weeks.

## 2015-02-19 DIAGNOSIS — E119 Type 2 diabetes mellitus without complications: Secondary | ICD-10-CM | POA: Diagnosis not present

## 2015-02-19 DIAGNOSIS — H5030 Unspecified intermittent heterotropia: Secondary | ICD-10-CM | POA: Diagnosis not present

## 2015-02-19 DIAGNOSIS — H2513 Age-related nuclear cataract, bilateral: Secondary | ICD-10-CM | POA: Diagnosis not present

## 2015-02-25 ENCOUNTER — Ambulatory Visit (INDEPENDENT_AMBULATORY_CARE_PROVIDER_SITE_OTHER): Payer: Medicare Other | Admitting: Pharmacist Clinician (PhC)/ Clinical Pharmacy Specialist

## 2015-02-25 DIAGNOSIS — I4891 Unspecified atrial fibrillation: Secondary | ICD-10-CM

## 2015-02-25 DIAGNOSIS — Z7901 Long term (current) use of anticoagulants: Secondary | ICD-10-CM | POA: Diagnosis not present

## 2015-02-25 LAB — POCT INR: INR: 2.5

## 2015-03-25 ENCOUNTER — Ambulatory Visit (INDEPENDENT_AMBULATORY_CARE_PROVIDER_SITE_OTHER): Payer: Medicare Other | Admitting: Pharmacist Clinician (PhC)/ Clinical Pharmacy Specialist

## 2015-03-25 ENCOUNTER — Other Ambulatory Visit: Payer: Self-pay | Admitting: *Deleted

## 2015-03-25 DIAGNOSIS — I4891 Unspecified atrial fibrillation: Secondary | ICD-10-CM

## 2015-03-25 DIAGNOSIS — Z79899 Other long term (current) drug therapy: Secondary | ICD-10-CM | POA: Diagnosis not present

## 2015-03-25 DIAGNOSIS — Z7901 Long term (current) use of anticoagulants: Secondary | ICD-10-CM

## 2015-03-25 LAB — POCT INR: INR: 2.4

## 2015-03-25 MED ORDER — BUPROPION HCL ER (XL) 150 MG PO TB24: ORAL_TABLET | ORAL | Status: DC

## 2015-03-26 LAB — BASIC METABOLIC PANEL
BUN: 16 mg/dL (ref 7–25)
CALCIUM: 8.6 mg/dL (ref 8.6–10.4)
CHLORIDE: 102 mmol/L (ref 98–110)
CO2: 26 mmol/L (ref 20–31)
CREATININE: 0.97 mg/dL (ref 0.50–0.99)
GLUCOSE: 113 mg/dL — AB (ref 65–99)
Potassium: 4.2 mmol/L (ref 3.5–5.3)
Sodium: 137 mmol/L (ref 135–146)

## 2015-03-27 ENCOUNTER — Ambulatory Visit (INDEPENDENT_AMBULATORY_CARE_PROVIDER_SITE_OTHER): Payer: Medicare Other | Admitting: *Deleted

## 2015-03-27 ENCOUNTER — Telehealth: Payer: Self-pay | Admitting: Cardiology

## 2015-03-27 DIAGNOSIS — I5022 Chronic systolic (congestive) heart failure: Secondary | ICD-10-CM

## 2015-03-27 DIAGNOSIS — I4581 Long QT syndrome: Secondary | ICD-10-CM | POA: Diagnosis not present

## 2015-03-27 NOTE — Telephone Encounter (Signed)
LMOVM reminding pt to send remote transmission.   

## 2015-03-27 NOTE — Progress Notes (Signed)
Remote ICD transmission.   

## 2015-04-01 ENCOUNTER — Other Ambulatory Visit: Payer: Self-pay

## 2015-04-01 DIAGNOSIS — Z1231 Encounter for screening mammogram for malignant neoplasm of breast: Secondary | ICD-10-CM

## 2015-04-01 LAB — CUP PACEART REMOTE DEVICE CHECK
Brady Statistic AP VS Percent: 93.76 %
Brady Statistic RA Percent Paced: 95 %
Brady Statistic RV Percent Paced: 1.36 %
HighPow Impedance: 361 Ohm
HighPow Impedance: 76 Ohm
Implantable Lead Implant Date: 20120120
Implantable Lead Location: 753859
Implantable Lead Location: 753860
Implantable Lead Model: 5076
Implantable Lead Model: 6935
Lead Channel Impedance Value: 399 Ohm
Lead Channel Impedance Value: 456 Ohm
Lead Channel Pacing Threshold Amplitude: 0.625 V
Lead Channel Pacing Threshold Pulse Width: 0.4 ms
Lead Channel Sensing Intrinsic Amplitude: 1.375 mV
Lead Channel Sensing Intrinsic Amplitude: 1.375 mV
Lead Channel Setting Pacing Pulse Width: 0.4 ms
MDC IDC LEAD IMPLANT DT: 20120120
MDC IDC MSMT BATTERY VOLTAGE: 2.99 V
MDC IDC MSMT LEADCHNL RA PACING THRESHOLD AMPLITUDE: 0.375 V
MDC IDC MSMT LEADCHNL RV PACING THRESHOLD PULSEWIDTH: 0.4 ms
MDC IDC MSMT LEADCHNL RV SENSING INTR AMPL: 11.5 mV
MDC IDC MSMT LEADCHNL RV SENSING INTR AMPL: 11.5 mV
MDC IDC SESS DTM: 20161214180525
MDC IDC SET LEADCHNL RA PACING AMPLITUDE: 2 V
MDC IDC SET LEADCHNL RV PACING AMPLITUDE: 2.5 V
MDC IDC SET LEADCHNL RV SENSING SENSITIVITY: 0.3 mV
MDC IDC STAT BRADY AP VP PERCENT: 1.24 %
MDC IDC STAT BRADY AS VP PERCENT: 0.12 %
MDC IDC STAT BRADY AS VS PERCENT: 4.87 %

## 2015-04-04 ENCOUNTER — Encounter: Payer: Self-pay | Admitting: *Deleted

## 2015-04-22 ENCOUNTER — Encounter: Payer: Self-pay | Admitting: Pulmonary Disease

## 2015-04-22 ENCOUNTER — Ambulatory Visit (INDEPENDENT_AMBULATORY_CARE_PROVIDER_SITE_OTHER): Payer: Medicare Other | Admitting: Pulmonary Disease

## 2015-04-22 ENCOUNTER — Ambulatory Visit (INDEPENDENT_AMBULATORY_CARE_PROVIDER_SITE_OTHER)
Admission: RE | Admit: 2015-04-22 | Discharge: 2015-04-22 | Disposition: A | Discharge status: Home / Self Care | Payer: Medicare Other | Source: Ambulatory Visit | Attending: Pulmonary Disease | Admitting: Pulmonary Disease

## 2015-04-22 ENCOUNTER — Ambulatory Visit (INDEPENDENT_AMBULATORY_CARE_PROVIDER_SITE_OTHER): Payer: Medicare Other | Admitting: Pharmacist Clinician (PhC)/ Clinical Pharmacy Specialist

## 2015-04-22 VITALS — BP 118/76 | HR 83 | Ht 66.0 in | Wt 199.4 lb

## 2015-04-22 DIAGNOSIS — Z7901 Long term (current) use of anticoagulants: Secondary | ICD-10-CM

## 2015-04-22 DIAGNOSIS — G4733 Obstructive sleep apnea (adult) (pediatric): Secondary | ICD-10-CM | POA: Diagnosis not present

## 2015-04-22 DIAGNOSIS — K219 Gastro-esophageal reflux disease without esophagitis: Secondary | ICD-10-CM

## 2015-04-22 DIAGNOSIS — R918 Other nonspecific abnormal finding of lung field: Secondary | ICD-10-CM

## 2015-04-22 DIAGNOSIS — J4540 Moderate persistent asthma, uncomplicated: Secondary | ICD-10-CM | POA: Diagnosis not present

## 2015-04-22 DIAGNOSIS — I4891 Unspecified atrial fibrillation: Secondary | ICD-10-CM

## 2015-04-22 DIAGNOSIS — J9811 Atelectasis: Secondary | ICD-10-CM | POA: Diagnosis not present

## 2015-04-22 DIAGNOSIS — Z9989 Dependence on other enabling machines and devices: Secondary | ICD-10-CM

## 2015-04-22 DIAGNOSIS — K449 Diaphragmatic hernia without obstruction or gangrene: Secondary | ICD-10-CM

## 2015-04-22 LAB — POCT INR: INR: 2.4

## 2015-04-22 NOTE — Progress Notes (Signed)
Subjective:    Patient ID: [name], female    DOB: Aug 13, 1950, 65 y.o.   MRN: [id number]  C.C.:  Follow-up for Moderate, Persistent Asthma, GERD, & OSA.  HPI Moderate, Persistent Asthma:  She reports she is compliant with Pulmicort. She reports she has intermittent use of her rescue inhaler or nebulizer at most twice in a week. No exacerbations since last appointment. She reports infrequent wheezing. Denies any coughing. No nocturnal awakenings with breathing problems. Checks her home peak flow meter intermittently.   GERD:  Patient reports remote history of reflux. H/O Hiatal hernia with some prior surgical intervention. She has been treated with Nexium for some time. No morning brash water taste. No reflux recently.   OSA:  Currently prescribed CPAP with 8 cm H2O pressure. Compliance download from 05/26/14 - 06/24/14 showed 100% usage >/= 4 hours with residual AHI 0.7 events/hour. She reports she is faithful with her CPAP. She reports she does feel her quality of sleep has improved on CPAP. She reports she has been napping some in the last 3 weeks. No easy dozing or morning headaches.   Review of Systems No chest tightness or pressure.  No nausea, emesis, or diarrhea. No fever, chills, or sweats.   Allergies  Allergen Reactions  . Avelox [Moxifloxacin Hcl In Nacl] Other (See Comments)    Cardiac arrest per pt  . Simvastatin Other (See Comments)    myalgias  . Ace Inhibitors Cough    Dry cough   . Latex Rash    Current Outpatient Prescriptions on File Prior to Visit  Medication Sig Dispense Refill  . B-D ULTRAFINE III SHORT PEN 31G X 8 MM MISC use as directed 100 each 9  . budesonide (PULMICORT FLEXHALER) 180 MCG/ACT inhaler Inhale 2 puffs into the lungs 2 (two) times daily. 1 each 11  . buPROPion (WELLBUTRIN XL) 150 MG 24 hr tablet take 1 tablet by mouth once daily 90 tablet 3  . chlorpheniramine (CHLOR-TRIMETON) 4 MG tablet 8mg  at night (Patient taking differently: Take 8 mg by  mouth at bedtime. 8mg  every other night) 14 tablet 0  . CRESTOR 10 MG tablet take 1 tablet by mouth once daily 90 tablet 3  . DIGITEK 125 MCG tablet take 1/2 tablets by mouth once daily (Patient taking differently: take 1/2 tablets by mouth once daily 0.0625 mg daily) 15 tablet 9  . dofetilide (TIKOSYN) 250 MCG capsule Take 250 mcg by mouth 2 (two) times daily.    Marland Kitchen esomeprazole (NEXIUM) 40 MG capsule Take 1 capsule (40 mg total) by mouth daily. 30 capsule 3  . fluticasone (FLONASE) 50 MCG/ACT nasal spray instill 1 spray into each nostril twice a day (Patient taking differently: instill 1 spray into each nostril daily as needed for allergies) 16 g 5  . insulin aspart (NOVOLOG FLEXPEN) 100 UNIT/ML FlexPen Please inject 15units each day with your biggest meal. (Patient taking differently: Please inject 10units each day with your biggest meal (lunch).) 15 mL 11  . Insulin Glargine (LANTUS) 100 UNIT/ML Solostar Pen Inject 50 Units into the skin at bedtime. 15 mL 12  . Insulin Pen Needle (B-D ULTRAFINE III SHORT PEN) 31G X 8 MM MISC Use as directed 100 each 9  . levalbuterol (XOPENEX HFA) 45 MCG/ACT inhaler inhale 1 to 2 puffs by mouth INTO THE LUNGS every 4 hours if needed for wheezing (Patient taking differently: Inhale 1-2 puffs into the lungs every 4 (four) hours as needed for wheezing. inhale 1 to  2 puffs by mouth INTO THE LUNGS every 4 hours if needed for wheezing) 15 g 4  . levalbuterol (XOPENEX) 0.63 MG/3ML nebulizer solution One vial in nebulizer four times daily as needed dx 493.00 (Patient taking differently: Take 0.63 mg by nebulization every 6 (six) hours as needed for wheezing or shortness of breath. One vial in nebulizer four times daily as needed dx 493.00) 360 mL 4  . losartan (COZAAR) 50 MG tablet take 1 tablet by mouth once daily 90 tablet 3  . metoprolol (LOPRESSOR) 50 MG tablet take 1 tablet by mouth twice a day 60 tablet 11  . polyethylene glycol powder (GLYCOLAX/MIRALAX) powder take  17GM (DISSOLVED IN WATER) by mouth once daily (Patient taking differently: take 17GM (DISSOLVED IN WATER) by mouth once daily PRN, takes three times a week) 255 g 5  . RA LORATADINE 10 MG tablet take 1 tablet by mouth once daily 30 tablet 5  . spironolactone (ALDACTONE) 25 MG tablet Take 0.5 tablets (12.5 mg total) by mouth daily. 30 tablet 6  . torsemide (DEMADEX) 10 MG tablet take 2 tablets by mouth once daily or as directed 70 tablet 8  . warfarin (COUMADIN) 5 MG tablet take 1 and 1/2 tablets by mouth once daily (Patient taking differently: Take 5 mg MWF and 7.5 mg all other days.) 45 tablet 5   No current facility-administered medications on file prior to visit.    Past Medical History  Diagnosis Date  . Atrial fibrillation (HCC)     ablation x 2 [facility], 01/2006, 2011.  on warfarin  . Hyperlipidemia   . Depression   . Anxiety   . Anemia   . Arthritis   . GERD (gastroesophageal reflux disease)   . Vocal cord disease   . Cardiac arrest [facility]) jan 2012    in hospital for pneumonia when this occured- occured at the hospital  . GASTROESOPHAGEAL REFLUX, NO ESOPHAGITIS 06/10/2006    Qualifier: Diagnosis of  By: Abundio Miu    . Non-ischemic cardiomyopathy (HCC)     echo 08/08/10 - EF 35-45% LV and RV systolic function mod reduced  . Limb pain 06/04/2008    LLE, Baker's cyst in popliteal fossa, no DVT  . Primary localized osteoarthritis of left knee   . History of hiatal hernia   . CHF (congestive heart failure) (HCC) 2012    Echo 08/08/10 by [facility]. EF 35-45%. LV systolic function moderately reduced. Moderate global hypokinesis of LV.  RV systolic function moderately reduced. Mild MR. Trace AR.  Marland Kitchen AICD (automatic cardioverter/defibrillator) present     Medtronic  . Asthma     has had multiple hospitalizations for this  . Complication of anesthesia     difficult time waking up after anesthesia  . Hypertension   . Dysrhythmia     Atrial fibrillation  . Diabetes mellitus      Type 2  . Sleep apnea     wears CPAP nightly    Past Surgical History  Procedure Laterality Date  . Cholecystectomy    . Knee arthroscopy Right   . Cardiac defibrillator placement  05/02/10    Medtronic Protecta XT-DR for CHF-VT, last download 04/12/12  . Paf ablation      By Dr Sampson Goon. Now sees Dr Steele Berg at [facility]  . Colonoscopy    . Tubal ligation    . Breast lumpectomy Right   . Hammer toe surgery Right   . Total knee arthroplasty Left 09/17/2014  . Total  knee arthroplasty Left 09/17/2014    Procedure: TOTAL KNEE ARTHROPLASTY;  Surgeon: Salvatore Marvel, MD;  Location: [facility] OR;  Service: Orthopedics;  Laterality: Left;  pt has ICD    Family History  Problem Relation Age of Onset  . Diabetes Father   . Hypertension Father   . Lung disease Neg Hx   . Cancer Neg Hx   . Rheumatologic disease Neg Hx     Social History   Social History  . Marital Status: Widowed    Spouse Name: N/A  . Number of Children: N/A  . Years of Education: N/A   Social History Main Topics  . Smoking status: Never Smoker   . Smokeless tobacco: Never Used  . Alcohol Use: No  . Drug Use: No  . Sexual Activity: No   Other Topics Concern  . None   Social History Narrative   Not employed   Exercise- walking 1 hour daily.    Diet- eating healthy diet.       [facility]:   She is originally from [facility]. Has always lived in [place]. 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 Currently has a small dog. No bird or mold exposure.       Objective:   Physical Exam  BP 118/76 mmHg  Pulse 83  Ht  (1.676 m)  Wt 199 lb 6.4 oz (90.447 kg)  BMI 32.20 kg/m2  SpO2 99%  LMP 07/26/2011 General:  Awake. Alert. No acute distress. Mild central obesity.  Integument:  Warm & dry. No rash on exposed skin. No bruising. HEENT:  Moist mucus membranes. No oral ulcers. No scleral injection or icterus.  Mallampati Class III. PERRL. Cardiovascular:  Regular rate. No edema. No appreciable JVD.  Pulmonary:  Good aeration & clear to auscultation bilaterally. Symmetric chest wall expansion. No accessory muscle use on room air. Musculoskeletal:  Normal bulk and tone. Hand grip strength 5/5 bilaterally. No joint deformity or effusion appreciated.  PFT 12/06/08: FVC 3.43 L (99%) FEV1 2.46 L (94%) FEV1/FVC 0.72 FEF 25-75 1.59 L (85%) no bronchodilator response TLC 6.17 L (112%) RV 131% ERV 52% DLCO uncorrected 74%  IMAGING CXR PA/LAT 09/19/14 (personally reviewed by me): Questionable right basilar density/opacity. No pleural effusion appreciated. Heart normal in size. Mediastinum normal in contour.    Assessment & Plan:  65 year old female with underlying moderate, persistent asthma. Her asthma seems to be well-controlled at this time on her current dose of inhaled steroid therapy. She has had no exacerbations since her last appointment. Additionally her reflux seems to be well-controlled on Nexium alone. Interestingly on review of her prior chest x-ray imaging from June 2016 she does have a right lower lobe opacity which is of unclear significance. She does report good sleep quality and response to CPAP therapy for her underlying OSA. I instructed the patient contact my office if she had any new breathing problems before her next appointment as I would be happy to see her sooner.  1. Moderate, Persistent Asthma: Continuing patient on current dose of Pulmicort inhaled twice daily. Plan to de-escalate inhaled steroid therapy at next appointment after obtaining full pulmonary function testing. 2. Right Lower Lobe Opacity:  Repeat CXR PA/LAT today. Informed the patient we will contact her with the results of this test.  3. OSA:  Continuing patient on CPAP therapy at 8 cm of H2O pressure indefinitely.  4. Health  Maintenance:  Had influenza vaccine Fall 2016, Pneumovax 02/22/12, & Prevnar 2015. 5. Follow-up:   Patient will return to clinic in 6 months or sooner if needed.

## 2015-04-22 NOTE — Patient Instructions (Addendum)
1. Continue using your inhalers as prescribed. 2. Call me if you have any new breathing problems before your next appointment. 3. We will be doing a breathing test at your next appointment. 4. I will see you back in 6 months or sooner as needed.   Tests: 1.  Full Pulmonary Function Testing at next appointment 2.  CXR PA/LAT today

## 2015-05-07 ENCOUNTER — Encounter: Payer: Self-pay | Admitting: Podiatry

## 2015-05-07 ENCOUNTER — Ambulatory Visit (INDEPENDENT_AMBULATORY_CARE_PROVIDER_SITE_OTHER): Payer: Medicare Other | Admitting: Podiatry

## 2015-05-07 DIAGNOSIS — Q828 Other specified congenital malformations of skin: Secondary | ICD-10-CM | POA: Diagnosis not present

## 2015-05-07 DIAGNOSIS — M79609 Pain in unspecified limb: Secondary | ICD-10-CM

## 2015-05-07 DIAGNOSIS — M79673 Pain in unspecified foot: Secondary | ICD-10-CM | POA: Diagnosis not present

## 2015-05-07 DIAGNOSIS — B351 Tinea unguium: Secondary | ICD-10-CM | POA: Diagnosis not present

## 2015-05-07 NOTE — Progress Notes (Signed)
Subjective:     Patient ID: [name], female   DOB: 06-Sep-1950, 65 y.o.   MRN: [id number]  HPIThis patient presents to the office for preventive foot care services.  She says her nails have grown thick and long since her last visit.  She has pain walking and wearing her shoes.  She also states her callus under both feet are painful especially under the right forefoot.She is diabetic with neuropathy.   Review of Systems     Objective:   Physical Exam GENERAL APPEARANCE: Alert, conversant. Appropriately groomed. No acute distress.  VASCULAR: Pedal pulses palpable at  [facility] and PT bilateral.  Capillary refill time is immediate to all digits,  Normal temperature gradient.  Digital hair growth is present bilateral  NEUROLOGIC: sensation is normal to 5.07 monofilament at 5/5 sites bilateral.  Light touch is intact bilateral, Muscle strength normal.  MUSCULOSKELETAL: acceptable muscle strength, tone and stability bilateral.  Intrinsic muscluature intact bilateral.  Rectus appearance of foot and digits noted bilateral.   DERMATOLOGIC: skin color, texture, and turgor are within normal limits.  No preulcerative lesions or ulcers  are seen, no interdigital maceration noted.  No open lesions present.  . No drainage noted.  NAILS  Thick disfigured discolored nails both feet.      Assessment:     Onychomycosis  Callus B/L     Plan:     ROV  Debridement and grinding of long nails.  Debridement of plantar tyloma.  RTC 3 months   Helane Gunther DPM

## 2015-05-13 ENCOUNTER — Ambulatory Visit
Admission: RE | Admit: 2015-05-13 | Discharge: 2015-05-13 | Disposition: A | Discharge status: Home / Self Care | Payer: Medicare Other | Source: Ambulatory Visit

## 2015-05-13 ENCOUNTER — Ambulatory Visit (INDEPENDENT_AMBULATORY_CARE_PROVIDER_SITE_OTHER): Payer: Medicare Other | Admitting: Family Medicine

## 2015-05-13 ENCOUNTER — Encounter: Payer: Self-pay | Admitting: Family Medicine

## 2015-05-13 VITALS — BP 118/71 | HR 68 | Temp 97.8°F | Ht 66.0 in | Wt 199.9 lb

## 2015-05-13 DIAGNOSIS — E1140 Type 2 diabetes mellitus with diabetic neuropathy, unspecified: Secondary | ICD-10-CM

## 2015-05-13 DIAGNOSIS — IMO0002 Reserved for concepts with insufficient information to code with codable children: Secondary | ICD-10-CM

## 2015-05-13 DIAGNOSIS — E1165 Type 2 diabetes mellitus with hyperglycemia: Secondary | ICD-10-CM

## 2015-05-13 DIAGNOSIS — Z1231 Encounter for screening mammogram for malignant neoplasm of breast: Secondary | ICD-10-CM | POA: Diagnosis not present

## 2015-05-13 DIAGNOSIS — Z0000 Encounter for general adult medical examination without abnormal findings: Secondary | ICD-10-CM | POA: Diagnosis not present

## 2015-05-13 LAB — POCT GLYCOSYLATED HEMOGLOBIN (HGB A1C): HEMOGLOBIN A1C: 9.6

## 2015-05-13 MED ORDER — INSULIN ASPART 100 UNIT/ML FLEXPEN: PEN_INJECTOR | SUBCUTANEOUS | Status: DC

## 2015-05-13 NOTE — Progress Notes (Signed)
Subjective:     Patient ID: [name], female   DOB: 12-16-1950, 65 y.o.   MRN: [id number]  HPI [name] is a 65yo female presenting today for follow up of diabetes. - Last A1C 9.9 in 01/2015 - Followed by Podiatry (04/2015) and Ophthalmology (02/2015) - Currently prescribed Lantus 50units daily and Novolog 10units with lunch - Fasting Blood Sugars: 79-160, average ~130 - Lunch Blood Sugars: 63-160, average ~130 - Supper Blood Sugars: 82-192, average ~160 - Denies hypoglycemia - Continues to work on diet and exercise.  - Denies any acute complaints today. Denies blurred vision, problems with urination, peripheral neuralgia - Denies smoking  Review of Systems Per HPI. Other systems negative.    Objective:   Physical Exam  Constitutional: She appears well-developed and well-nourished. No distress.  HENT:  Head: Normocephalic and atraumatic.  Cardiovascular: Normal rate and regular rhythm.  Exam reveals no gallop and no friction rub.   No murmur heard. Pedal pulses palpable bilaterally  Pulmonary/Chest: Effort normal. No respiratory distress. She has no wheezes.  Musculoskeletal: She exhibits no edema.  Neurological:  Normal sensation over feet bilaterally except in area of callous formation on right foot.  Skin: No rash noted.  Callous formation noted over right foot  Psychiatric: She has a normal mood and affect. Her behavior is normal.      Assessment and Plan:     Type 2 diabetes, uncontrolled, with neuropathy - A1C slightly improved to 9.6 from 9.9 in 01/2015 - Patient reluctant to add 10units of Novolog to supper due to fear of hypoglycemia (smaller meal than lunch). Will initiate 5units Novolog with supper in addition to 10 units with lunch. - Continue Lantus 50 units daily - Continue to work on diet and exercise. Encouraged follow up with nutrition. - Unable to produce urine for testing today. Check urine microalbumin and protein/creatinine ratio at next visit. -  Follow up in 59months to recheck A1C  Health maintenance examination - Pap smear at next visit

## 2015-05-13 NOTE — Patient Instructions (Signed)
Thank you so much for coming to visit me today! We will increase your insulin to Lantus 50units daily, Novolog 10 units prior to lunch, and Novolog 5 units prior to supper. Please continue to check your blood sugars and record to bring to next visit. Please follow up in three months. At your next visit, we will check your urine for protein and get you up to date on your pap smears.  Thanks again! Dr. Caroleen Hamman

## 2015-05-13 NOTE — Assessment & Plan Note (Addendum)
-   A1C slightly improved to 9.6 from 9.9 in 01/2015 - Patient reluctant to add 10units of Novolog to supper due to fear of hypoglycemia (smaller meal than lunch). Will initiate 5units Novolog with supper in addition to 10 units with lunch. - Continue Lantus 50 units daily - Continue to work on diet and exercise. Encouraged follow up with nutrition. - Unable to produce urine for testing today. Check urine microalbumin and protein/creatinine ratio at next visit. - Follow up in 66months to recheck A1C

## 2015-05-13 NOTE — Assessment & Plan Note (Signed)
Pap smear at next visit

## 2015-05-20 ENCOUNTER — Ambulatory Visit (INDEPENDENT_AMBULATORY_CARE_PROVIDER_SITE_OTHER): Payer: Medicare Other | Admitting: Pharmacist Clinician (PhC)/ Clinical Pharmacy Specialist

## 2015-05-20 DIAGNOSIS — I4891 Unspecified atrial fibrillation: Secondary | ICD-10-CM | POA: Diagnosis not present

## 2015-05-20 DIAGNOSIS — Z7901 Long term (current) use of anticoagulants: Secondary | ICD-10-CM

## 2015-05-20 LAB — POCT INR: INR: 2.8

## 2015-05-22 ENCOUNTER — Encounter: Payer: Self-pay | Admitting: Cardiovascular Disease

## 2015-05-22 ENCOUNTER — Ambulatory Visit (INDEPENDENT_AMBULATORY_CARE_PROVIDER_SITE_OTHER): Payer: Medicare Other | Admitting: Cardiovascular Disease

## 2015-05-22 VITALS — BP 132/79 | HR 85 | Ht 66.0 in | Wt 200.5 lb

## 2015-05-22 DIAGNOSIS — I5022 Chronic systolic (congestive) heart failure: Secondary | ICD-10-CM | POA: Diagnosis not present

## 2015-05-22 DIAGNOSIS — Z79899 Other long term (current) drug therapy: Secondary | ICD-10-CM | POA: Diagnosis not present

## 2015-05-22 DIAGNOSIS — E1165 Type 2 diabetes mellitus with hyperglycemia: Secondary | ICD-10-CM

## 2015-05-22 DIAGNOSIS — N182 Chronic kidney disease, stage 2 (mild): Secondary | ICD-10-CM

## 2015-05-22 DIAGNOSIS — Z95810 Presence of automatic (implantable) cardiac defibrillator: Secondary | ICD-10-CM

## 2015-05-22 DIAGNOSIS — I472 Ventricular tachycardia, unspecified: Secondary | ICD-10-CM

## 2015-05-22 DIAGNOSIS — Z7901 Long term (current) use of anticoagulants: Secondary | ICD-10-CM

## 2015-05-22 DIAGNOSIS — IMO0002 Reserved for concepts with insufficient information to code with codable children: Secondary | ICD-10-CM

## 2015-05-22 DIAGNOSIS — E669 Obesity, unspecified: Secondary | ICD-10-CM

## 2015-05-22 DIAGNOSIS — E1140 Type 2 diabetes mellitus with diabetic neuropathy, unspecified: Secondary | ICD-10-CM

## 2015-05-22 DIAGNOSIS — I480 Paroxysmal atrial fibrillation: Secondary | ICD-10-CM | POA: Diagnosis not present

## 2015-05-22 DIAGNOSIS — G4730 Sleep apnea, unspecified: Secondary | ICD-10-CM

## 2015-05-22 HISTORY — DX: Ventricular tachycardia: I47.2

## 2015-05-22 HISTORY — DX: Ventricular tachycardia, unspecified: I47.20

## 2015-05-22 MED ORDER — METOPROLOL TARTRATE 50 MG PO TABS: 75.0000 mg | ORAL_TABLET | Freq: Two times a day (BID) | ORAL | Status: DC

## 2015-05-22 NOTE — Progress Notes (Signed)
Patient ID: [name], female   DOB: 1950-08-23, 65 y.o.   MRN: [id number]    Cardiology Office Note    Date:  05/22/2015   ID:  [name], DOB 11/25/1950, MRN [id number]  PCP:  Garry Heater, DO  Cardiologist:   Thurmon Fair, MD   Chief Complaint  Patient presents with  . Follow-up    6 month//pt states no Sx.    History of Present Illness:  [name] is a 65 y.o. female with a long-standing history of nonischemic cardiomyopathy, chronic systolic heart failure and paroxysmal atrial fibrillation.  She returns for routine follow-up and has not been aware of any new problems. She has recovered well from her knee surgery and has returned her previous activity level of roughly 4 hours a day. At the same time there has been a steady reduction in the burden of atrial fibrillation which is now back to her previous baseline of 1-2%. She has not had any bleeding problems on well-tolerated/well-managed warfarin anticoagulation. Rate control during the episodes of atrial fibrillation is fair if not perfect (heart rates 97-1 110 bpm). She has no awareness of atrial fibrillation.   Interrogation of her defibrillator shows a lengthy episode of ventricular tachycardia that is regular and appears to be monomorphic. It was not preceded by a "long-short" sequence. It was very rapid at about 231 bpm in the VF zone. It was appropriately detected by her defibrillator. Attempt at burst overdrive pacing during charging was unsuccessful. The arrhythmia self terminated probably just a fraction of the second before re-detection would have occurred ("dirty break"). The event occurred roughly 6 weeks ago, during normal daily activity and she has no recollection of presyncope, palpitations or any other untoward symptoms at that time. She has had a couple of other brief episodes of nonsustained ventricular tachycardia, also asymptomatic.  Her QT interval today is 480 ms (on dofetilide). Her ECG shows AV sequential  pacing with ventricular pseudofusion, but the actual burden of ventricular pacing is only 1.1%. There is 94% atrial pacing. Battery voltage is 2.95 V (RRT equals 2.63 V).  She reports good compliance with CPAP therapy for obstructive sleep apnea and has not had any issues with exertional dyspnea, angina, syncope, lower extremity edema, dizziness. She has chronic persistent asthma that is generally well controlled and does not take any long-acting bronchodilators. He has been on digoxin for long time, pretty much since the diagnosis of atrial fibrillation and cardiomyopathy.  She has a history of moderate nonischemic cardiomyopathy (no coronary disease by cardiac catheterization January 2012), recurrent paroxysmal atrial fibrillation status post 2 ablation procedures (Dr. Orson Aloe), with history of ventricular fibrillation arrest while on treatment with diltiazem and dofetilide and a prolonged QT interval, probably related to simultaneous quinolone therapy. She has a dual-chamber Medtronic Protecta defibrillator implanted in January 2012.  Past Medical History  Diagnosis Date  . Atrial fibrillation (HCC)     ablation x 2 [facility], 01/2006, 2011.  on warfarin  . Hyperlipidemia   . Depression   . Anxiety   . Anemia   . Arthritis   . GERD (gastroesophageal reflux disease)   . Vocal cord disease   . Cardiac arrest [facility]) jan 2012    in hospital for pneumonia when this occured- occured at the hospital  . GASTROESOPHAGEAL REFLUX, NO ESOPHAGITIS 06/10/2006    Qualifier: Diagnosis of  By: Abundio Miu    . Non-ischemic cardiomyopathy (HCC)     echo 08/08/10 - EF 35-45% LV and RV  systolic function mod reduced  . Limb pain 06/04/2008    LLE, Baker's cyst in popliteal fossa, no DVT  . Primary localized osteoarthritis of left knee   . History of hiatal hernia   . CHF (congestive heart failure) (HCC) 2012    Echo 08/08/10 by [facility]. EF 35-45%. LV systolic function moderately reduced. Moderate  global hypokinesis of LV.  RV systolic function moderately reduced. Mild MR. Trace AR.  Marland Kitchen AICD (automatic cardioverter/defibrillator) present     Medtronic  . Asthma     has had multiple hospitalizations for this  . Complication of anesthesia     difficult time waking up after anesthesia  . Hypertension   . Dysrhythmia     Atrial fibrillation  . Diabetes mellitus     Type 2  . Sleep apnea     wears CPAP nightly    Past Surgical History  Procedure Laterality Date  . Cholecystectomy    . Knee arthroscopy Right   . Cardiac defibrillator placement  05/02/10    Medtronic Protecta XT-DR for CHF-VT, last download 04/12/12  . Paf ablation      By Dr Sampson Goon. Now sees Dr Steele Berg at [facility]  . Colonoscopy    . Tubal ligation    . Breast lumpectomy Right   . Hammer toe surgery Right   . Total knee arthroplasty Left 09/17/2014  . Total knee arthroplasty Left 09/17/2014    Procedure: TOTAL KNEE ARTHROPLASTY;  Surgeon: Salvatore Marvel, MD;  Location: [facility] OR;  Service: Orthopedics;  Laterality: Left;  pt has ICD    Outpatient Prescriptions Prior to Visit  Medication Sig Dispense Refill  . B-D ULTRAFINE III SHORT PEN 31G X 8 MM MISC use as directed 100 each 9  . budesonide (PULMICORT FLEXHALER) 180 MCG/ACT inhaler Inhale 2 puffs into the lungs 2 (two) times daily. 1 each 11  . buPROPion (WELLBUTRIN XL) 150 MG 24 hr tablet take 1 tablet by mouth once daily 90 tablet 3  . chlorpheniramine (CHLOR-TRIMETON) 4 MG tablet 8mg  at night (Patient taking differently: Take 8 mg by mouth at bedtime. 8mg  every other night) 14 tablet 0  . CRESTOR 10 MG tablet take 1 tablet by mouth once daily 90 tablet 3  . DIGITEK 125 MCG tablet take 1/2 tablets by mouth once daily (Patient taking differently: take 1/2 tablets by mouth once daily 0.0625 mg daily) 15 tablet 9  . dofetilide (TIKOSYN) 250 MCG capsule Take 250 mcg by mouth 2 (two) times daily.    Marland Kitchen esomeprazole (NEXIUM) 40 MG capsule Take 1 capsule (40 mg total)  by mouth daily. 30 capsule 3  . fluticasone (FLONASE) 50 MCG/ACT nasal spray instill 1 spray into each nostril twice a day (Patient taking differently: instill 1 spray into each nostril daily as needed for allergies) 16 g 5  . insulin aspart (NOVOLOG FLEXPEN) 100 UNIT/ML FlexPen Please inject 10units prior to lunch and 5units prior to supper. 15 mL 11  . Insulin Glargine (LANTUS) 100 UNIT/ML Solostar Pen Inject 50 Units into the skin at bedtime. 15 mL 12  . Insulin Pen Needle (B-D ULTRAFINE III SHORT PEN) 31G X 8 MM MISC Use as directed 100 each 9  . levalbuterol (XOPENEX HFA) 45 MCG/ACT inhaler inhale 1 to 2 puffs by mouth INTO THE LUNGS every 4 hours if needed for wheezing (Patient taking differently: Inhale 1-2 puffs into the lungs every 4 (four) hours as needed for wheezing. inhale 1 to 2 puffs by mouth  INTO THE LUNGS every 4 hours if needed for wheezing) 15 g 4  . levalbuterol (XOPENEX) 0.63 MG/3ML nebulizer solution One vial in nebulizer four times daily as needed dx 493.00 (Patient taking differently: Take 0.63 mg by nebulization every 6 (six) hours as needed for wheezing or shortness of breath. One vial in nebulizer four times daily as needed dx 493.00) 360 mL 4  . losartan (COZAAR) 50 MG tablet take 1 tablet by mouth once daily 90 tablet 3  . polyethylene glycol powder (GLYCOLAX/MIRALAX) powder take 17GM (DISSOLVED IN WATER) by mouth once daily (Patient taking differently: take 17GM (DISSOLVED IN WATER) by mouth once daily PRN, takes three times a week) 255 g 5  . RA LORATADINE 10 MG tablet take 1 tablet by mouth once daily 30 tablet 5  . spironolactone (ALDACTONE) 25 MG tablet Take 0.5 tablets (12.5 mg total) by mouth daily. 30 tablet 6  . torsemide (DEMADEX) 10 MG tablet take 2 tablets by mouth once daily or as directed 70 tablet 8  . warfarin (COUMADIN) 5 MG tablet take 1 and 1/2 tablets by mouth once daily (Patient taking differently: Take 5 mg MWF and 7.5 mg all other days.) 45 tablet 5  .  metoprolol (LOPRESSOR) 50 MG tablet take 1 tablet by mouth twice a day 60 tablet 11   No facility-administered medications prior to visit.     Allergies:   Avelox; Simvastatin; Ace inhibitors; and Latex   Social History   Social History  . Marital Status: Widowed    Spouse Name: N/A  . Number of Children: N/A  . Years of Education: N/A   Social History Main Topics  . Smoking status: Never Smoker   . Smokeless tobacco: Never Used  . Alcohol Use: No  . Drug Use: No  . Sexual Activity: No   Other Topics Concern  . None   Social History Narrative   Not employed   Exercise- walking 1 hour daily.    Diet- eating healthy diet.       [facility]:   She is originally from [place]. Has always lived in [place]. 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 Currently has a small dog. No bird or mold exposure.      Family History:  The patient's family history includes Diabetes in her father; Hypertension in her father. There is no history of Lung disease, Cancer, or Rheumatologic disease.   ROS:   Please see the history of present illness.    ROS All other systems reviewed and are negative.   PHYSICAL EXAM:   VS:  BP 132/79 mmHg  Pulse 85  Ht 5\' 6"  (1.676 m)  Wt 90.946 kg (200 lb 8 oz)  BMI 32.38 kg/m2  LMP 07/26/2011   GEN: Well nourished, well developed, in no acute distress HEENT: normal Neck: no JVD, carotid bruits, or masses Cardiac: RRR; no murmurs, rubs, or gallops,no edema  Respiratory:  clear to auscultation bilaterally, normal work of breathing GI: soft, nontender, nondistended, + BS MS: no deformity or atrophy Skin: warm and dry, no rash Neuro:  Alert and Oriented x 3, Strength and sensation are intact Psych: euthymic mood, full affect  Wt Readings from Last 3 Encounters:  05/22/15 90.946 kg (200 lb 8 oz)  05/13/15 90.674 kg (199 lb 14.4 oz)  04/22/15 90.447  kg (199 lb 6.4 oz)      Studies/Labs Reviewed:  EKG:  EKG is ordered today.  The ekg ordered today demonstrates AV sequential pacing with ventricular pseudofusion  Recent Labs: 09/07/2014: ALT 29 09/20/2014: Hemoglobin 8.9*; Platelets 184 03/25/2015: BUN 16; Creat 0.97; Potassium 4.2; Sodium 137   Lipid Panel    Component Value Date/Time   CHOL 117* 02/14/2015 0939   TRIG 89 02/14/2015 0939   HDL 47 02/14/2015 0939   CHOLHDL 2.5 02/14/2015 0939   VLDL 18 02/14/2015 0939   LDLCALC 52 02/14/2015 0939   LDLDIRECT 94 12/07/2007 2019     ASSESSMENT:    1. Ventricular tachycardia (HCC)   2. ICD (St. Jude Protecta dual-chamber),secondary prevention (VF arrest) January 2012   3. Medication management   4. Chronic systolic heart failure (HCC)   5. Paroxysmal atrial fibrillation (HCC)   6. Long term current use of anticoagulant therapy   7. Type 2 diabetes, uncontrolled, with neuropathy (HCC)   8. CKD (chronic kidney disease) stage 2, GFR 60-89 ml/min   9. Obesity (BMI 30-39.9)   10. Sleep apnea    1. VT: Appears to be monomorphic. Does not look like torsades. She was asymptomatic and the arrhythmia essentially terminated spontaneously ("dirty break"). Check digoxin level and electrolytes. Increase metoprolol to 75 mg twice a day. Stop digoxin. QT interval is appropriately prolonged for treatment with dofetilide, not excessive. 2. ICD: Normal device function. ATP during charge was not successful. Consider EP evaluation to review device settings. Will recheck her device in the office in 3 months. 3. Stopped the digoxin. Check labs 4. CHF: Appears well compensated hemodynamically, NYHA functional class I, no clinical signs of hypervolemia. Thoracic impedance is in normal range. 5. PAF: On appropriate warfarin anticoagulation. After she recovered from her knee surgery the burden of atrial fibrillation has again minutes to previous baseline of roughly 1-2%, asymptomatic. 6. Warfarin  anticoagulation: No bleeding complications and no history of embolic TIA/stroke. CHADVasc 4 (CHF, gender, HTN, DM). 7. DM on long-term insulin therapy. Most recent A1c just a few days ago was still elevated at 9.6%. 8. CKD: Recheck labs 9. Weight loss again encouraged 10. Compliant with CPAP     PLAN:  In order of problems listed above:  Fascinating. Have not seen one there before. Medication Adjustments/Labs and Tests Ordered: Current medicines are reviewed at length with the patient today.  Concerns regarding medicines are outlined above.  Medication changes, Labs and Tests ordered today are listed in the Patient Instructions below. Patient Instructions  Your physician has recommended you make the following change in your medication:   STOP DIGITEK (DIGOXIN)  INCREASE METOPROLOL 50 MG TO 1 1/2 TABLET TWICE DAILY    Your physician recommends that you return for lab work in: TODAY AT [facility]  Dr. Royann Shivers recommends that you schedule a follow-up appointment in: 3 MONTHS WITH PACEMAKER CHECK (MEDTRONIC-BLUE).          Joie Bimler, MD  05/22/2015 5:01 PM    [facility] [address] [place], [place], [place]  [address] Phone: [phone]; Fax: [phone]

## 2015-05-22 NOTE — Patient Instructions (Signed)
Your physician has recommended you make the following change in your medication:   STOP DIGITEK (DIGOXIN)  INCREASE METOPROLOL 50 MG TO 1 1/2 TABLET TWICE DAILY    Your physician recommends that you return for lab work in: TODAY AT [facility]  Dr. Royann Shivers recommends that you schedule a follow-up appointment in: 3 MONTHS WITH PACEMAKER CHECK (MEDTRONIC-BLUE).

## 2015-05-23 ENCOUNTER — Other Ambulatory Visit: Payer: Self-pay | Admitting: *Deleted

## 2015-05-23 DIAGNOSIS — M25562 Pain in left knee: Secondary | ICD-10-CM | POA: Diagnosis not present

## 2015-05-23 LAB — COMPREHENSIVE METABOLIC PANEL
ALK PHOS: 97 U/L (ref 33–130)
ALT: 33 U/L — AB (ref 6–29)
AST: 21 U/L (ref 10–35)
Albumin: 3.6 g/dL (ref 3.6–5.1)
BUN: 17 mg/dL (ref 7–25)
CHLORIDE: 101 mmol/L (ref 98–110)
CO2: 27 mmol/L (ref 20–31)
CREATININE: 1.2 mg/dL — AB (ref 0.50–0.99)
Calcium: 8.7 mg/dL (ref 8.6–10.4)
GLUCOSE: 178 mg/dL — AB (ref 65–99)
POTASSIUM: 4.4 mmol/L (ref 3.5–5.3)
SODIUM: 135 mmol/L (ref 135–146)
TOTAL PROTEIN: 7.2 g/dL (ref 6.1–8.1)
Total Bilirubin: 0.5 mg/dL (ref 0.2–1.2)

## 2015-05-23 LAB — DIGOXIN LEVEL

## 2015-05-23 MED ORDER — METOPROLOL TARTRATE 50 MG PO TABS: 75.0000 mg | ORAL_TABLET | Freq: Two times a day (BID) | ORAL | Status: DC

## 2015-05-24 ENCOUNTER — Other Ambulatory Visit: Payer: Self-pay | Admitting: *Deleted

## 2015-06-01 ENCOUNTER — Other Ambulatory Visit: Payer: Self-pay | Admitting: Critical Care Medicine

## 2015-06-04 ENCOUNTER — Other Ambulatory Visit: Payer: Self-pay | Admitting: *Deleted

## 2015-06-04 MED ORDER — ESOMEPRAZOLE MAGNESIUM 40 MG PO CPDR: 40.0000 mg | DELAYED_RELEASE_CAPSULE | Freq: Every day | ORAL | Status: DC

## 2015-07-02 ENCOUNTER — Ambulatory Visit (INDEPENDENT_AMBULATORY_CARE_PROVIDER_SITE_OTHER): Payer: Medicare Other | Admitting: Pharmacist Clinician (PhC)/ Clinical Pharmacy Specialist

## 2015-07-02 ENCOUNTER — Other Ambulatory Visit: Payer: Self-pay | Admitting: *Deleted

## 2015-07-02 ENCOUNTER — Encounter: Payer: Medicare Other | Admitting: Cardiovascular Disease

## 2015-07-02 DIAGNOSIS — I4891 Unspecified atrial fibrillation: Secondary | ICD-10-CM

## 2015-07-02 DIAGNOSIS — Z7901 Long term (current) use of anticoagulants: Secondary | ICD-10-CM

## 2015-07-02 LAB — POCT INR: INR: 1.9

## 2015-07-02 MED ORDER — LEVALBUTEROL TARTRATE 45 MCG/ACT IN AERO: INHALATION_SPRAY | RESPIRATORY_TRACT | Status: DC

## 2015-07-05 ENCOUNTER — Other Ambulatory Visit: Payer: Self-pay | Admitting: *Deleted

## 2015-07-08 ENCOUNTER — Telehealth: Payer: Self-pay | Admitting: Cardiovascular Disease

## 2015-07-08 MED ORDER — POLYETHYLENE GLYCOL 3350 17 GM/SCOOP PO POWD: ORAL | Status: DC

## 2015-07-08 MED ORDER — SPIRONOLACTONE 25 MG PO TABS: 12.5000 mg | ORAL_TABLET | Freq: Every day | ORAL | Status: DC

## 2015-07-08 NOTE — Telephone Encounter (Signed)
Pt having a tooth pulled and needs to come off Coumadin 5 days before and 2 days after-pls call for confirmation [phone]

## 2015-07-08 NOTE — Telephone Encounter (Signed)
Patient will be going to [facility] on [address]  Advised her that we don't stop warfarin for any days for single tooth extraction.  Dental office has told patient they will not call us for clarification, patient must get approval.  Advised patient that we can only recommend she hold for 1-2 days prior and restart on day of extraction.

## 2015-07-30 ENCOUNTER — Ambulatory Visit (INDEPENDENT_AMBULATORY_CARE_PROVIDER_SITE_OTHER): Payer: Medicare Other | Admitting: Pharmacist Clinician (PhC)/ Clinical Pharmacy Specialist

## 2015-07-30 DIAGNOSIS — I4891 Unspecified atrial fibrillation: Secondary | ICD-10-CM | POA: Diagnosis not present

## 2015-07-30 DIAGNOSIS — Z7901 Long term (current) use of anticoagulants: Secondary | ICD-10-CM | POA: Diagnosis not present

## 2015-07-30 LAB — POCT INR: INR: 3.3

## 2015-07-31 ENCOUNTER — Ambulatory Visit (INDEPENDENT_AMBULATORY_CARE_PROVIDER_SITE_OTHER): Payer: Medicare Other | Admitting: Acute Care

## 2015-07-31 ENCOUNTER — Encounter: Payer: Self-pay | Admitting: Acute Care

## 2015-07-31 VITALS — BP 120/74 | HR 78 | Ht 67.0 in | Wt 204.0 lb

## 2015-07-31 DIAGNOSIS — J4540 Moderate persistent asthma, uncomplicated: Secondary | ICD-10-CM

## 2015-07-31 DIAGNOSIS — R062 Wheezing: Secondary | ICD-10-CM

## 2015-07-31 MED ORDER — HYDROCODONE-HOMATROPINE 5-1.5 MG/5ML PO SYRP: 5.0000 mL | ORAL_SOLUTION | Freq: Four times a day (QID) | ORAL | Status: DC | PRN

## 2015-07-31 MED ORDER — METHYLPREDNISOLONE ACETATE 80 MG/ML IJ SUSP
80.0000 mg | Freq: Once | INTRAMUSCULAR | Status: AC
  Administered 2015-07-31: 80 mg via INTRAMUSCULAR

## 2015-07-31 MED ORDER — LEVALBUTEROL HCL 0.63 MG/3ML IN NEBU
0.6300 mg | INHALATION_SOLUTION | Freq: Once | RESPIRATORY_TRACT | Status: AC
  Administered 2015-07-31: 0.63 mg via RESPIRATORY_TRACT

## 2015-07-31 NOTE — Patient Instructions (Signed)
It is nice to meet you today. I am sorry you are not feeling well. We will give you a breathing treatment today in the office We will give you a Depo Medrol Injection Hydromet cough syrup 5 cc's every 6 hours as needed for cough. Continue using your Symbicort Inhaler  Rinse after use. Continue using your Flonase nasal Spray Continue using your loratadine anti-histamine. Shower after being outside for any prolonged period of time. Instead of throat clearing use sips of water to soothe your throat. Use sugar free Jolly Rancher hard candies to soothe your throat Avoid mint and menthol. Follow up in 2-3 weeks with either me or Dr. Jamison Neighbor. Please contact office for sooner follow up if symptoms do not improve or worsen or seek emergency care

## 2015-07-31 NOTE — Assessment & Plan Note (Signed)
Moderate Persistent Asthma Flare: Plan: We will give you a breathing treatment today in the office We will give you a Depo Medrol Injection Hydromet cough syrup 5 cc's every 6 hours as needed for cough. Continue using your Symbicort Inhaler  Rinse after use. Continue using your Flonase nasal Spray Continue using your loratadine anti-histamine. Shower after being outside for any prolonged period of time. Instead of throat clearing use sips of water to soothe your throat. Use sugar free Jolly Rancher hard candies to soothe your throat Avoid mint and menthol. Follow up in 2-3 weeks with either me or Dr. Jamison Neighbor. Please contact office for sooner follow up if symptoms do not improve or worsen or seek emergency care

## 2015-07-31 NOTE — Progress Notes (Signed)
Subjective:    Patient ID: [name], female    DOB: 09-21-1950, 65 y.o.   MRN: [id number]  HPI  65 year old female patient with moderate persistent asthma. She is on Pulmicort as maintenance and uses her rescue inhaler as needed for SOB and wheezing.Additional medical history includes GERD, and OSA, on nocturnal CPAP. Former Dr. Delford Field patient, now seen by Dr. Jamison Neighbor.  PFT 12/06/08: FVC 3.43 L (99%) FEV1 2.46 L (94%) FEV1/FVC 0.72 FEF 25-75 1.59 L (85%) no bronchodilator response TLC 6.17 L (112%) RV 131% ERV 52% DLCO uncorrected 74%  Imaging:  04/22/2015:Chest 2 View: No acute cardiopulmonary findings.   07/31/2015  Acute OV:  Patient presents to the office today with increased shortness of breath, chest tightness, wheezing, and nonproductive cough that started 07/30/2015. She denies fever, chest pain, orthopnea, hemoptysis, leg or calf pain. She feels that this is a flare of her asthma brought on by the recent increase in pollen. She has been using her rescue inhaler more frequently over the last 24 hours. She was given a Xopenex treatment while in the office today.   Current outpatient prescriptions:  .  B-D ULTRAFINE III SHORT PEN 31G X 8 MM MISC, use as directed, Disp: 100 each, Rfl: 9 .  budesonide (PULMICORT FLEXHALER) 180 MCG/ACT inhaler, Inhale 2 puffs into the lungs 2 (two) times daily., Disp: 1 each, Rfl: 11 .  buPROPion (WELLBUTRIN XL) 150 MG 24 hr tablet, take 1 tablet by mouth once daily, Disp: 90 tablet, Rfl: 3 .  chlorpheniramine (CHLOR-TRIMETON) 4 MG tablet, 8mg  at night (Patient taking differently: Take 8 mg by mouth at bedtime. 8mg  every other night), Disp: 14 tablet, Rfl: 0 .  CRESTOR 10 MG tablet, take 1 tablet by mouth once daily, Disp: 90 tablet, Rfl: 3 .  DIGITEK 125 MCG tablet, take 1/2 tablets by mouth once daily (Patient taking differently: take 1/2 tablets by mouth once daily 0.0625 mg daily), Disp: 15 tablet, Rfl: 9 .  dofetilide (TIKOSYN) 250 MCG capsule,  Take 250 mcg by mouth 2 (two) times daily., Disp: , Rfl:  .  esomeprazole (NEXIUM) 40 MG capsule, Take 1 capsule (40 mg total) by mouth daily., Disp: 30 capsule, Rfl: 3 .  fluticasone (FLONASE) 50 MCG/ACT nasal spray, instill 1 spray into each nostril twice a day (Patient taking differently: instill 1 spray into each nostril daily as needed for allergies), Disp: 16 g, Rfl: 5 .  insulin aspart (NOVOLOG FLEXPEN) 100 UNIT/ML FlexPen, Please inject 10units prior to lunch and 5units prior to supper., Disp: 15 mL, Rfl: 11 .  Insulin Glargine (LANTUS) 100 UNIT/ML Solostar Pen, Inject 50 Units into the skin at bedtime., Disp: 15 mL, Rfl: 12 .  Insulin Pen Needle (B-D ULTRAFINE III SHORT PEN) 31G X 8 MM MISC, Use as directed, Disp: 100 each, Rfl: 9 .  levalbuterol (XOPENEX HFA) 45 MCG/ACT inhaler, INHALE 1 TO 2 PUFFS BY MOUTH INTO THE LUNGS EVERY 4 HOURS IF NEEDED FOR WHEEZING, Disp: 1 Inhaler, Rfl: 5 .  levalbuterol (XOPENEX) 0.63 MG/3ML nebulizer solution, One vial in nebulizer four times daily as needed dx 493.00 (Patient taking differently: Take 0.63 mg by nebulization every 6 (six) hours as needed for wheezing or shortness of breath. One vial in nebulizer four times daily as needed dx 493.00), Disp: 360 mL, Rfl: 4 .  losartan (COZAAR) 50 MG tablet, take 1 tablet by mouth once daily, Disp: 90 tablet, Rfl: 3 .  metoprolol (LOPRESSOR) 50 MG tablet,  Take 1.5 tablets (75 mg total) by mouth 2 (two) times daily. take 1 tablet by mouth twice a day, Disp: 90 tablet, Rfl: 4 .  polyethylene glycol powder (GLYCOLAX/MIRALAX) powder, take 17GM (DISSOLVED IN WATER) by mouth once daily PRN, takes three times a week, Disp: 255 g, Rfl: 5 .  RA LORATADINE 10 MG tablet, take 1 tablet by mouth once daily, Disp: 30 tablet, Rfl: 5 .  spironolactone (ALDACTONE) 25 MG tablet, Take 0.5 tablets (12.5 mg total) by mouth daily., Disp: 30 tablet, Rfl: 6 .  torsemide (DEMADEX) 10 MG tablet, take 2 tablets by mouth once daily or as  directed, Disp: 70 tablet, Rfl: 8 .  warfarin (COUMADIN) 5 MG tablet, take 1 and 1/2 tablets by mouth once daily (Patient taking differently: Take 5 mg MWF and 7.5 mg all other days.), Disp: 45 tablet, Rfl: 5 .  HYDROcodone-homatropine (HYCODAN) 5-1.5 MG/5ML syrup, Take 5 mLs by mouth every 6 (six) hours as needed for cough., Disp: 140 mL, Rfl: 0   Past Medical History  Diagnosis Date  . Atrial fibrillation (HCC)     ablation x 2 [facility], 01/2006, 2011.  on warfarin  . Hyperlipidemia   . Depression   . Anxiety   . Anemia   . Arthritis   . GERD (gastroesophageal reflux disease)   . Vocal cord disease   . Cardiac arrest [facility]) jan 2012    in hospital for pneumonia when this occured- occured at the hospital  . GASTROESOPHAGEAL REFLUX, NO ESOPHAGITIS 06/10/2006    Qualifier: Diagnosis of  By: Abundio Miu    . Non-ischemic cardiomyopathy (HCC)     echo 08/08/10 - EF 35-45% LV and RV systolic function mod reduced  . Limb pain 06/04/2008    LLE, Baker's cyst in popliteal fossa, no DVT  . Primary localized osteoarthritis of left knee   . History of hiatal hernia   . CHF (congestive heart failure) (HCC) 2012    Echo 08/08/10 by [facility]. EF 35-45%. LV systolic function moderately reduced. Moderate global hypokinesis of LV.  RV systolic function moderately reduced. Mild MR. Trace AR.  Marland Kitchen AICD (automatic cardioverter/defibrillator) present     Medtronic  . Asthma     has had multiple hospitalizations for this  . Complication of anesthesia     difficult time waking up after anesthesia  . Hypertension   . Dysrhythmia     Atrial fibrillation  . Diabetes mellitus     Type 2  . Sleep apnea     wears CPAP nightly    Allergies  Allergen Reactions  . Avelox [Moxifloxacin Hcl In Nacl] Other (See Comments)    Cardiac arrest per pt  . Simvastatin Other (See Comments)    myalgias  . Ace Inhibitors Cough    Dry cough   . Latex Rash   Review of Systems    Constitutional:   No   weight loss, night sweats,  Fevers, chills, fatigue, or  lassitude.  HEENT:   No headaches,  Difficulty swallowing,  Tooth/dental problems, or  +Sore throat from cough,                No sneezing, itching, ear ache, nasal congestion, +post nasal drip,   CV:  No chest pain,  Orthopnea, PND, swelling in lower extremities, anasarca, dizziness, palpitations, syncope.   GI  No heartburn, indigestion, abdominal pain, nausea, vomiting, diarrhea, change in bowel habits, loss of appetite, bloody stools.   Resp: + shortness  of breath with exertion or at rest.  No excess mucus, no productive cough,  + non-productive cough,  No coughing up of blood.  No change in color of mucus.  + wheezing.  No chest wall deformity  Skin: no rash or lesions.  GU: no dysuria, change in color of urine, no urgency or frequency.  No flank pain, no hematuria   MS:  No joint pain or swelling.  No decreased range of motion.  No back pain.  Psych:  No change in mood or affect. No depression or anxiety.  No memory loss.     Objective:   Physical Exam  BP 120/74 mmHg  Pulse 78  Ht  (1.702 m)  Wt 204 lb (92.534 kg)  BMI 31.94 kg/m2  SpO2 99%  LMP 07/26/2011  Physical Exam:  General- No distress,  A&Ox3 ENT: No sinus tenderness, TM clear, pale nasal mucosa, no oral exudate,no post nasal drip, no LAN Cardiac: S1, S2, regular rate and rhythm, no murmur Chest: + wheeze upper airway, not lung fields / rales/ dullness; no accessory muscle use, no nasal flaring, no sternal retractions Abd.: Soft Non-tender Ext: No clubbing cyanosis, edema Neuro:  normal strength Skin: No rashes, warm and dry Psych: normal mood and behavior  Bevelyn Ngo, AGACNP-BC [facility] Pulmonary/Critical Care Medicine 07/31/2015    Assessment & Plan:

## 2015-08-01 ENCOUNTER — Other Ambulatory Visit: Payer: Self-pay | Admitting: *Deleted

## 2015-08-01 NOTE — Progress Notes (Signed)
Note reviewed.  Donna Christen Jamison Neighbor, M.D. [facility] Pulmonary & Critical Care Pager:  [phone] After 3pm or if no response, call [phone] 12:10 AM 08/01/2015

## 2015-08-05 MED ORDER — FLUTICASONE PROPIONATE 50 MCG/ACT NA SUSP: NASAL | Status: DC

## 2015-08-13 ENCOUNTER — Ambulatory Visit (INDEPENDENT_AMBULATORY_CARE_PROVIDER_SITE_OTHER): Payer: Medicare Other | Admitting: Podiatry

## 2015-08-13 ENCOUNTER — Encounter: Payer: Self-pay | Admitting: Podiatry

## 2015-08-13 ENCOUNTER — Telehealth: Payer: Self-pay

## 2015-08-13 DIAGNOSIS — L84 Corns and callosities: Secondary | ICD-10-CM

## 2015-08-13 DIAGNOSIS — M79673 Pain in unspecified foot: Secondary | ICD-10-CM | POA: Diagnosis not present

## 2015-08-13 DIAGNOSIS — M1712 Unilateral primary osteoarthritis, left knee: Secondary | ICD-10-CM | POA: Diagnosis not present

## 2015-08-13 DIAGNOSIS — B351 Tinea unguium: Secondary | ICD-10-CM | POA: Diagnosis not present

## 2015-08-13 DIAGNOSIS — M79609 Pain in unspecified limb: Principal | ICD-10-CM

## 2015-08-13 DIAGNOSIS — E1140 Type 2 diabetes mellitus with diabetic neuropathy, unspecified: Secondary | ICD-10-CM

## 2015-08-13 DIAGNOSIS — Z794 Long term (current) use of insulin: Secondary | ICD-10-CM

## 2015-08-13 DIAGNOSIS — M25561 Pain in right knee: Secondary | ICD-10-CM | POA: Diagnosis not present

## 2015-08-13 NOTE — Telephone Encounter (Signed)
Request for surgical clearance:   1. What type surgery is being performed? Right TKR  2. When is this surgery scheduled? 11/18/2015  3. Are there any medications that need to be held prior to surgery and how long? Coumdin, not specified how long to hold  4. Name of the physician performing surgery: Dr Salvatore Marvel  5. What is the office phone and fax number? [facility]  Phone [phone]  Fax [phone]

## 2015-08-13 NOTE — Progress Notes (Signed)
Subjective:     Patient ID: [name], female   DOB: Aug 18, 1950, 65 y.o.   MRN: [id number]  HPIThis patient presents to the office for preventive foot care services.  She says her nails have grown thick and long since her last visit.  She has pain walking and wearing her shoes.  She also states her callus under both feet are painful especially under the right forefoot.She is diabetic with neuropathy.   Review of Systems     Objective:   Physical Exam GENERAL APPEARANCE: Alert, conversant. Appropriately groomed. No acute distress.  VASCULAR: Pedal pulses palpable at  [facility] and PT bilateral.  Capillary refill time is immediate to all digits,  Normal temperature gradient.  Digital hair growth is present bilateral  NEUROLOGIC: sensation is normal to 5.07 monofilament at 5/5 sites bilateral.  Light touch is intact bilateral, Muscle strength normal.  MUSCULOSKELETAL: acceptable muscle strength, tone and stability bilateral.  Intrinsic muscluature intact bilateral.  Rectus appearance of foot and digits noted bilateral.   DERMATOLOGIC: skin color, texture, and turgor are within normal limits.  No preulcerative lesions or ulcers  are seen, no interdigital maceration noted.  No open lesions present.  . No drainage noted. Callus sub 2,3 right foot.  Pinch callus right foot.  Distal clavi 2 right.   Callus sub4th left foot.  NAILS  Thick disfigured discolored nails both feet.      Assessment:     Onychomycosis  Callus B/L     Plan:     ROV  Debridement and grinding of long nails.  Debridement of plantar tyloma.  RTC 3 months   Helane Gunther DPM

## 2015-08-16 ENCOUNTER — Encounter: Payer: Self-pay | Admitting: Pulmonary Disease

## 2015-08-16 ENCOUNTER — Ambulatory Visit (INDEPENDENT_AMBULATORY_CARE_PROVIDER_SITE_OTHER): Payer: Medicare Other | Admitting: Pulmonary Disease

## 2015-08-16 VITALS — BP 124/72 | HR 78 | Ht 66.0 in | Wt 201.0 lb

## 2015-08-16 DIAGNOSIS — J4540 Moderate persistent asthma, uncomplicated: Secondary | ICD-10-CM

## 2015-08-16 DIAGNOSIS — G4733 Obstructive sleep apnea (adult) (pediatric): Secondary | ICD-10-CM | POA: Diagnosis not present

## 2015-08-16 DIAGNOSIS — Z9989 Dependence on other enabling machines and devices: Secondary | ICD-10-CM

## 2015-08-16 DIAGNOSIS — K219 Gastro-esophageal reflux disease without esophagitis: Secondary | ICD-10-CM

## 2015-08-16 MED ORDER — BENZONATATE 100 MG PO CAPS: 100.0000 mg | ORAL_CAPSULE | Freq: Three times a day (TID) | ORAL | Status: DC | PRN

## 2015-08-16 NOTE — Patient Instructions (Signed)
   Keep using your inhalers as prescribed  Try using the Tessalon Perles (benzonatate) pills but I'm prescribing it to help with your cough at night. Try taking 1 pill before you go to bed. If these do not help your cough and your cough continues after a couple weeks at night while you're sleeping call my office and we will address the cough further.  We're keeping your breathing and follow-up appointments with me in July as they are scheduled right now

## 2015-08-16 NOTE — Progress Notes (Signed)
Subjective:    Patient ID: [name], female    DOB: Nov 17, 1950, 65 y.o.   MRN: [id number]  C.C.:  Follow-up for Moderate, Persistent Asthma, GERD, & OSA.  HPI Moderate, Persistent Asthma:  Patient seen in our office on 4/19 for an acute exacerbation. Patient was treated with IM injection of Depo-Medrol & given Hydromet cough syrup. She reports her symptoms have dramatically improved. She is still waking up with a cough nearly every night but this improving. She reports minimal wheezing. Cough produces a white mucus. Hydromet cough syrup does seem to help with her cough at night. She is using her rescue inhaler intermittently and not on a daily basis. Still using her Pulmicort as prescribed.   GERD:  Denies any reflux or dyspepsia. No morning brash water taste. Compliant with Nexium.   OSA:  Currently prescribed CPAP with 8 cm H2O pressure. Reports she is still compliant with her CPAP. She reports she does get good quality of sleep but does have periods where her sleep quality is not as good without an identifiable cause.   Review of Systems No fever, chills, or sweats. She denies any chest pain. She denies any significant chest pressure or tightness. Denies any sinus congestion or pressure. No post-nasal drainage.   Allergies  Allergen Reactions  . Avelox [Moxifloxacin Hcl In Nacl] Other (See Comments)    Cardiac arrest per pt  . Simvastatin Other (See Comments)    myalgias  . Ace Inhibitors Cough    Dry cough   . Latex Rash    Current Outpatient Prescriptions on File Prior to Visit  Medication Sig Dispense Refill  . B-D ULTRAFINE III SHORT PEN 31G X 8 MM MISC use as directed 100 each 9  . budesonide (PULMICORT FLEXHALER) 180 MCG/ACT inhaler Inhale 2 puffs into the lungs 2 (two) times daily. 1 each 11  . buPROPion (WELLBUTRIN XL) 150 MG 24 hr tablet take 1 tablet by mouth once daily 90 tablet 3  . chlorpheniramine (CHLOR-TRIMETON) 4 MG tablet 8mg  at night (Patient taking  differently: Take 8 mg by mouth at bedtime. 8mg  every other night) 14 tablet 0  . CRESTOR 10 MG tablet take 1 tablet by mouth once daily 90 tablet 3  . dofetilide (TIKOSYN) 250 MCG capsule Take 250 mcg by mouth 2 (two) times daily.    Marland Kitchen esomeprazole (NEXIUM) 40 MG capsule Take 1 capsule (40 mg total) by mouth daily. 30 capsule 3  . fluticasone (FLONASE) 50 MCG/ACT nasal spray instill 1 spray into each nostril twice a day 16 g 5  . HYDROcodone-homatropine (HYCODAN) 5-1.5 MG/5ML syrup Take 5 mLs by mouth every 6 (six) hours as needed for cough. 140 mL 0  . insulin aspart (NOVOLOG FLEXPEN) 100 UNIT/ML FlexPen Please inject 10units prior to lunch and 5units prior to supper. 15 mL 11  . Insulin Glargine (LANTUS) 100 UNIT/ML Solostar Pen Inject 50 Units into the skin at bedtime. 15 mL 12  . Insulin Pen Needle (B-D ULTRAFINE III SHORT PEN) 31G X 8 MM MISC Use as directed 100 each 9  . levalbuterol (XOPENEX HFA) 45 MCG/ACT inhaler INHALE 1 TO 2 PUFFS BY MOUTH INTO THE LUNGS EVERY 4 HOURS IF NEEDED FOR WHEEZING 1 Inhaler 5  . levalbuterol (XOPENEX) 0.63 MG/3ML nebulizer solution One vial in nebulizer four times daily as needed dx 493.00 (Patient taking differently: Take 0.63 mg by nebulization every 6 (six) hours as needed for wheezing or shortness of breath. One vial in nebulizer  four times daily as needed dx 493.00) 360 mL 4  . losartan (COZAAR) 50 MG tablet take 1 tablet by mouth once daily 90 tablet 3  . metoprolol (LOPRESSOR) 50 MG tablet Take 1.5 tablets (75 mg total) by mouth 2 (two) times daily. take 1 tablet by mouth twice a day 90 tablet 4  . polyethylene glycol powder (GLYCOLAX/MIRALAX) powder take 17GM (DISSOLVED IN WATER) by mouth once daily PRN, takes three times a week 255 g 5  . RA LORATADINE 10 MG tablet take 1 tablet by mouth once daily 30 tablet 5  . spironolactone (ALDACTONE) 25 MG tablet Take 0.5 tablets (12.5 mg total) by mouth daily. 30 tablet 6  . torsemide (DEMADEX) 10 MG tablet take  2 tablets by mouth once daily or as directed 70 tablet 8  . warfarin (COUMADIN) 5 MG tablet take 1 and 1/2 tablets by mouth once daily (Patient taking differently: Take 5 mg MWF and 7.5 mg all other days.) 45 tablet 5   No current facility-administered medications on file prior to visit.    Past Medical History  Diagnosis Date  . Atrial fibrillation (HCC)     ablation x 2 [facility], 01/2006, 2011.  on warfarin  . Hyperlipidemia   . Depression   . Anxiety   . Anemia   . Arthritis   . GERD (gastroesophageal reflux disease)   . Vocal cord disease   . Cardiac arrest [facility]) jan 2012    in hospital for pneumonia when this occured- occured at the hospital  . GASTROESOPHAGEAL REFLUX, NO ESOPHAGITIS 06/10/2006    Qualifier: Diagnosis of  By: Abundio Miu    . Non-ischemic cardiomyopathy (HCC)     echo 08/08/10 - EF 35-45% LV and RV systolic function mod reduced  . Limb pain 06/04/2008    LLE, Baker's cyst in popliteal fossa, no DVT  . Primary localized osteoarthritis of left knee   . History of hiatal hernia   . CHF (congestive heart failure) (HCC) 2012    Echo 08/08/10 by [facility]. EF 35-45%. LV systolic function moderately reduced. Moderate global hypokinesis of LV.  RV systolic function moderately reduced. Mild MR. Trace AR.  Marland Kitchen AICD (automatic cardioverter/defibrillator) present     Medtronic  . Asthma     has had multiple hospitalizations for this  . Complication of anesthesia     difficult time waking up after anesthesia  . Hypertension   . Dysrhythmia     Atrial fibrillation  . Diabetes mellitus     Type 2  . Sleep apnea     wears CPAP nightly    Past Surgical History  Procedure Laterality Date  . Cholecystectomy    . Knee arthroscopy Right   . Cardiac defibrillator placement  05/02/10    Medtronic Protecta XT-DR for CHF-VT, last download 04/12/12  . Paf ablation      By Dr Sampson Goon. Now sees Dr Steele Berg at [facility]  . Colonoscopy    . Tubal ligation    . Breast  lumpectomy Right   . Hammer toe surgery Right   . Total knee arthroplasty Left 09/17/2014  . Total knee arthroplasty Left 09/17/2014    Procedure: TOTAL KNEE ARTHROPLASTY;  Surgeon: Salvatore Marvel, MD;  Location: [facility] OR;  Service: Orthopedics;  Laterality: Left;  pt has ICD    Family History  Problem Relation Age of Onset  . Diabetes Father   . Hypertension Father   . Lung disease Neg Hx   .  Cancer Neg Hx   . Rheumatologic disease Neg Hx     Social History   Social History  . Marital Status: Widowed    Spouse Name: N/A  . Number of Children: N/A  . Years of Education: N/A   Social History Main Topics  . Smoking status: Never Smoker   . Smokeless tobacco: Never Used  . Alcohol Use: No  . Drug Use: No  . Sexual Activity: No   Other Topics Concern  . None   Social History Narrative   Not employed   Exercise- walking 1 hour daily.    Diet- eating healthy diet.       [facility]:   She is originally from [place] Has always lived in [place]. 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 Currently has a small dog. No bird or mold exposure.       Objective:   Physical Exam  BP 124/72 mmHg  Pulse 78  Ht  (1.676 m)  Wt 201 lb (91.173 kg)  BMI 32.46 kg/m2  SpO2 97%  LMP 07/26/2011 General:  Awake. Alert. Nodistress.  Integument:  Warm & dry. No rash on exposed skin.  HEENT:  Moist mucus membranes. No oral ulcers. No scleral injection.  Cardiovascular:  Regular rate. No edema. Normal S1 & S2. Pulmonary: Clear on auscultation bilaterally. Good aeration bilaterally. Normal work of breathing on room air. Abdomen: Soft. Mildly protuberant. Normal bowel sounds.  PFT 12/06/08: FVC 3.43 L (99%) FEV1 2.46 L (94%) FEV1/FVC 0.72 FEF 25-75 1.59 L (85%) no bronchodilator response TLC 6.17 L (112%) RV 131% ERV 52% DLCO uncorrected 74%  IMAGING CXR PA/LAT 04/22/15 (personally reviewed  by me): Tenting of left hemidiaphragm. No focal opacity or effusion appreciated. Heart normal in size & mediastinum normal in contour.  CXR PA/LAT 09/19/14 (previously reviewed by me): Questionable right basilar density/opacity. No pleural effusion appreciated. Heart normal in size. Mediastinum normal in contour.    Assessment & Plan:  65 year old female with underlying moderate, persistent asthma. She seems to be recovering well from her recent exacerbation. Reviewing her prior chest x-ray from January showed no focal opacity or abnormality. In the absence of wheezing on physical exam today I do not feel that further steroid therapy or antibiotics would be of benefit to the patient. Patient is continuing to use her CPAP therapy and with the use of Hydromet cough syrup I question her sleep quality. As such I'm trying benzonatate in place of Hydromet cough syrup for short-term relief of her cough. I instructed the patient contact my office if her cough continues after a couple weeks or she develops any new breathing problems before her next appointment.  1. Moderate, Persistent Asthma: Continuing patient on Xopenex & Pulmicort as prescribed. Cough suppression with Tessalon Perles & sparing Hydromet cough syrup. Already scheduled for full pulmonary function testing in July. 2. GERD: Asymptomatic. Continuing on Nexium. 3. OSA:  Good compliance. Continuing on CPAP indefinitely. 4. Health Maintenance:  Had influenza vaccine Fall 2016, Pneumovax 02/22/12, & Prevnar 2015. 5. Follow-up:  Keep July follow-up appointments as scheduled.  Donna Christen Jamison Neighbor, M.D. [facility] Pager:  [phone] After 3pm or if no response, call [phone] 9:35 AM 08/16/2015

## 2015-08-19 ENCOUNTER — Encounter: Payer: Self-pay | Admitting: Cardiovascular Disease

## 2015-08-19 NOTE — Telephone Encounter (Signed)
Sent via epic 

## 2015-08-20 ENCOUNTER — Telehealth: Payer: Self-pay | Admitting: Cardiology

## 2015-08-20 NOTE — Telephone Encounter (Signed)
Spoke w/ pt and informed her that MD wanted her to send a remote transmission. Pt verbalized understanding is aware of appt w/ MD on 08-21-15 at 9:45 AM.

## 2015-08-20 NOTE — Telephone Encounter (Signed)
LMOVM for pt to return my call. She needs to be scheduled for 91 day follow up.

## 2015-08-21 ENCOUNTER — Ambulatory Visit (INDEPENDENT_AMBULATORY_CARE_PROVIDER_SITE_OTHER): Payer: Medicare Other | Admitting: Pharmacist

## 2015-08-21 ENCOUNTER — Ambulatory Visit: Payer: Medicare Other | Admitting: Cardiovascular Disease

## 2015-08-21 ENCOUNTER — Encounter: Payer: Self-pay | Admitting: Cardiovascular Disease

## 2015-08-21 ENCOUNTER — Ambulatory Visit (INDEPENDENT_AMBULATORY_CARE_PROVIDER_SITE_OTHER): Payer: Medicare Other | Admitting: Cardiovascular Disease

## 2015-08-21 VITALS — BP 126/83 | HR 79 | Ht 66.0 in | Wt 202.0 lb

## 2015-08-21 DIAGNOSIS — I4891 Unspecified atrial fibrillation: Secondary | ICD-10-CM

## 2015-08-21 DIAGNOSIS — Z5181 Encounter for therapeutic drug level monitoring: Secondary | ICD-10-CM

## 2015-08-21 DIAGNOSIS — Z79899 Other long term (current) drug therapy: Secondary | ICD-10-CM

## 2015-08-21 DIAGNOSIS — I5022 Chronic systolic (congestive) heart failure: Secondary | ICD-10-CM | POA: Diagnosis not present

## 2015-08-21 DIAGNOSIS — I472 Ventricular tachycardia, unspecified: Secondary | ICD-10-CM

## 2015-08-21 DIAGNOSIS — G4730 Sleep apnea, unspecified: Secondary | ICD-10-CM

## 2015-08-21 DIAGNOSIS — E7800 Pure hypercholesterolemia, unspecified: Secondary | ICD-10-CM

## 2015-08-21 DIAGNOSIS — I10 Essential (primary) hypertension: Secondary | ICD-10-CM

## 2015-08-21 DIAGNOSIS — Z01810 Encounter for preprocedural cardiovascular examination: Secondary | ICD-10-CM

## 2015-08-21 DIAGNOSIS — IMO0002 Reserved for concepts with insufficient information to code with codable children: Secondary | ICD-10-CM

## 2015-08-21 DIAGNOSIS — Z7901 Long term (current) use of anticoagulants: Secondary | ICD-10-CM | POA: Diagnosis not present

## 2015-08-21 DIAGNOSIS — N182 Chronic kidney disease, stage 2 (mild): Secondary | ICD-10-CM

## 2015-08-21 DIAGNOSIS — E669 Obesity, unspecified: Secondary | ICD-10-CM

## 2015-08-21 DIAGNOSIS — E1140 Type 2 diabetes mellitus with diabetic neuropathy, unspecified: Secondary | ICD-10-CM

## 2015-08-21 DIAGNOSIS — Z95810 Presence of automatic (implantable) cardiac defibrillator: Secondary | ICD-10-CM

## 2015-08-21 DIAGNOSIS — E1165 Type 2 diabetes mellitus with hyperglycemia: Secondary | ICD-10-CM

## 2015-08-21 HISTORY — DX: Encounter for therapeutic drug level monitoring: Z51.81

## 2015-08-21 HISTORY — DX: Long term (current) use of anticoagulants: Z79.01

## 2015-08-21 LAB — POCT INR: INR: 2.5

## 2015-08-21 NOTE — Progress Notes (Signed)
Patient ID: [name], female   DOB: 08-18-1950, 65 y.o.   MRN: [id number] Patient ID: [name], female   DOB: [birth year], 65 y.o.   MRN: [id number]    Cardiology Office Note    Date:  08/22/2015   ID:  [name], DOB 1950-05-01, MRN [id number]  PCP:  Garry Heater, DO  Cardiologist:   Thurmon Fair, MD   Chief Complaint  Patient presents with  . 3 MONTHS    NO COMPLAINTS.    History of Present Illness:  [name] is a 65 y.o. female with a long-standing history of nonischemic cardiomyopathy, chronic systolic heart failure and paroxysmal atrial fibrillation.  She returns for routine follow-up and has not been aware of any new problems. She has recovered well from her knee surgery andIs planning right total knee replacement in August. Activity level has returned to previous baseline and the overall burden of atrial fibrillation has decreased to the previous baseline of about 1%. She has not had any bleeding problems on well-tolerated/well-managed warfarin anticoagulation. Rate control during the episodes of atrial fibrillation is fair and she has no awareness of the atrial fibrillation.   Interrogation of her defibrillator shows that there have been no new episodes of significant ventricular tachycardia since the one reviewed at her last office visit in February. She had 2 episodes of nonsustained ventricular tachycardia that lasted each for 1 second, no electrograms available  Her QT interval today is 463 ms (on dofetilide). Her ECG shows atrial paced, ventricular sensed rhythm. There is 96% atrial pacing and only 0.7% ventricular pacing. Battery voltage is 2.94 V (RRT=2.63 V).  She reports good compliance with CPAP therapy for obstructive sleep apnea and has not had any issues with exertional dyspnea, angina, syncope, lower extremity edema, dizziness. She has chronic persistent asthma that is generally well controlled and does not take any long-acting bronchodilators.   She  has a history of moderate nonischemic cardiomyopathy (no coronary disease by cardiac catheterization January 2012), recurrent paroxysmal atrial fibrillation status post 2 ablation procedures (Dr. Orson Aloe), with history of ventricular fibrillation arrest while on treatment with diltiazem and dofetilide and a prolonged QT interval, probably related to simultaneous quinolone therapy. She has a dual-chamber Medtronic Protecta defibrillator implanted in January 2012. In December 2016 her defibrillator recorded an episode of sustained monomorphic ventricular tachycardia. Antitachycardia pacing was unsuccessful at converting the arrhythmia, but she had a "dirty break" before defibrillator shock was delivered. The shock was aborted.  Past Medical History  Diagnosis Date  . Atrial fibrillation (HCC)     ablation x 2 [facility], 01/2006, 2011.  on warfarin  . Hyperlipidemia   . Depression   . Anxiety   . Anemia   . Arthritis   . GERD (gastroesophageal reflux disease)   . Vocal cord disease   . Cardiac arrest [facility]) jan 2012    in hospital for pneumonia when this occured- occured at the hospital  . GASTROESOPHAGEAL REFLUX, NO ESOPHAGITIS 06/10/2006    Qualifier: Diagnosis of  By: Abundio Miu    . Non-ischemic cardiomyopathy (HCC)     echo 08/08/10 - EF 35-45% LV and RV systolic function mod reduced  . Limb pain 06/04/2008    LLE, Baker's cyst in popliteal fossa, no DVT  . Primary localized osteoarthritis of left knee   . History of hiatal hernia   . CHF (congestive heart failure) (HCC) 2012    Echo 08/08/10 by [facility]. EF 35-45%. LV systolic function moderately  reduced. Moderate global hypokinesis of LV.  RV systolic function moderately reduced. Mild MR. Trace AR.  Marland Kitchen AICD (automatic cardioverter/defibrillator) present     Medtronic  . Asthma     has had multiple hospitalizations for this  . Complication of anesthesia     difficult time waking up after anesthesia  . Hypertension   .  Dysrhythmia     Atrial fibrillation  . Diabetes mellitus     Type 2  . Sleep apnea     wears CPAP nightly    Past Surgical History  Procedure Laterality Date  . Cholecystectomy    . Knee arthroscopy Right   . Cardiac defibrillator placement  05/02/10    Medtronic Protecta XT-DR for CHF-VT, last download 04/12/12  . Paf ablation      By Dr Sampson Goon. Now sees Dr Steele Berg at [facility]  . Colonoscopy    . Tubal ligation    . Breast lumpectomy Right   . Hammer toe surgery Right   . Total knee arthroplasty Left 09/17/2014  . Total knee arthroplasty Left 09/17/2014    Procedure: TOTAL KNEE ARTHROPLASTY;  Surgeon: Salvatore Marvel, MD;  Location: [facility] OR;  Service: Orthopedics;  Laterality: Left;  pt has ICD    Outpatient Prescriptions Prior to Visit  Medication Sig Dispense Refill  . B-D ULTRAFINE III SHORT PEN 31G X 8 MM MISC use as directed 100 each 9  . budesonide (PULMICORT FLEXHALER) 180 MCG/ACT inhaler Inhale 2 puffs into the lungs 2 (two) times daily. 1 each 11  . buPROPion (WELLBUTRIN XL) 150 MG 24 hr tablet take 1 tablet by mouth once daily 90 tablet 3  . chlorpheniramine (CHLOR-TRIMETON) 4 MG tablet 8mg  at night (Patient taking differently: Take 8 mg by mouth at bedtime. 8mg  every other night) 14 tablet 0  . CRESTOR 10 MG tablet take 1 tablet by mouth once daily 90 tablet 3  . dofetilide (TIKOSYN) 250 MCG capsule Take 250 mcg by mouth 2 (two) times daily.    Marland Kitchen esomeprazole (NEXIUM) 40 MG capsule Take 1 capsule (40 mg total) by mouth daily. 30 capsule 3  . fluticasone (FLONASE) 50 MCG/ACT nasal spray instill 1 spray into each nostril twice a day 16 g 5  . insulin aspart (NOVOLOG FLEXPEN) 100 UNIT/ML FlexPen Please inject 10units prior to lunch and 5units prior to supper. 15 mL 11  . Insulin Glargine (LANTUS) 100 UNIT/ML Solostar Pen Inject 50 Units into the skin at bedtime. 15 mL 12  . Insulin Pen Needle (B-D ULTRAFINE III SHORT PEN) 31G X 8 MM MISC Use as directed 100 each 9  .  levalbuterol (XOPENEX HFA) 45 MCG/ACT inhaler INHALE 1 TO 2 PUFFS BY MOUTH INTO THE LUNGS EVERY 4 HOURS IF NEEDED FOR WHEEZING 1 Inhaler 5  . levalbuterol (XOPENEX) 0.63 MG/3ML nebulizer solution One vial in nebulizer four times daily as needed dx 493.00 (Patient taking differently: Take 0.63 mg by nebulization every 6 (six) hours as needed for wheezing or shortness of breath. One vial in nebulizer four times daily as needed dx 493.00) 360 mL 4  . losartan (COZAAR) 50 MG tablet take 1 tablet by mouth once daily 90 tablet 3  . metoprolol (LOPRESSOR) 50 MG tablet Take 1.5 tablets (75 mg total) by mouth 2 (two) times daily. take 1 tablet by mouth twice a day 90 tablet 4  . polyethylene glycol powder (GLYCOLAX/MIRALAX) powder take 17GM (DISSOLVED IN WATER) by mouth once daily PRN, takes three times a week 255 g  5  . RA LORATADINE 10 MG tablet take 1 tablet by mouth once daily 30 tablet 5  . spironolactone (ALDACTONE) 25 MG tablet Take 0.5 tablets (12.5 mg total) by mouth daily. 30 tablet 6  . torsemide (DEMADEX) 10 MG tablet take 2 tablets by mouth once daily or as directed 70 tablet 8  . warfarin (COUMADIN) 5 MG tablet take 1 and 1/2 tablets by mouth once daily (Patient taking differently: Take 5 mg MWF and 7.5 mg all other days.) 45 tablet 5  . benzonatate (TESSALON) 100 MG capsule Take 1 capsule (100 mg total) by mouth 3 (three) times daily as needed for cough. 90 capsule 0  . HYDROcodone-homatropine (HYCODAN) 5-1.5 MG/5ML syrup Take 5 mLs by mouth every 6 (six) hours as needed for cough. 140 mL 0   No facility-administered medications prior to visit.     Allergies:   Avelox; Simvastatin; Ace inhibitors; and Latex   Social History   Social History  . Marital Status: Widowed    Spouse Name: N/A  . Number of Children: N/A  . Years of Education: N/A   Social History Main Topics  . Smoking status: Never Smoker   . Smokeless tobacco: Never Used  . Alcohol Use: No  . Drug Use: No  . Sexual  Activity: No   Other Topics Concern  . None   Social History Narrative   Not employed   Exercise- walking 1 hour daily.    Diet- eating healthy diet.       [facility]:   She is originally from [place]. Has always lived in [place]. 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 Currently has a small dog. No bird or mold exposure.      Family History:  The patient's family history includes Diabetes in her father; Hypertension in her father. There is no history of Lung disease, Cancer, or Rheumatologic disease.   ROS:   Please see the history of present illness.    ROS All other systems reviewed and are negative.   PHYSICAL EXAM:   VS:  BP 126/83 mmHg  Pulse 79  Ht 5\' 6"  (1.676 m)  Wt 91.627 kg (202 lb)  BMI 32.62 kg/m2  LMP 07/26/2011   GEN: Well nourished, well developed, in no acute distress HEENT: normal Neck: no JVD, carotid bruits, or masses Cardiac: RRR; no murmurs, rubs, or gallops,no edema  Respiratory:  clear to auscultation bilaterally, normal work of breathing GI: soft, nontender, nondistended, + BS MS: no deformity or atrophy Skin: warm and dry, no rash Neuro:  Alert and Oriented x 3, Strength and sensation are intact Psych: euthymic mood, full affect  Wt Readings from Last 3 Encounters:  08/21/15 91.627 kg (202 lb)  08/16/15 91.173 kg (201 lb)  07/31/15 92.534 kg (204 lb)      Studies/Labs Reviewed:   EKG:  EKG is ordered today.  The ekg ordered today demonstrates atrial paced ventricular sensed rhythm  Recent Labs: 09/20/2014: Hemoglobin 8.9*; Platelets 184 05/22/2015: ALT 33*; BUN 17; Creat 1.20*; Potassium 4.4; Sodium 135   Lipid Panel    Component Value Date/Time   CHOL 117* 02/14/2015 0939   TRIG 89 02/14/2015 0939   HDL 47 02/14/2015 0939   CHOLHDL 2.5 02/14/2015 0939   VLDL 18 02/14/2015 0939   LDLCALC 52 02/14/2015 0939   LDLDIRECT 94  12/07/2007 2019     ASSESSMENT:  1. Ventricular tachycardia (HCC)   2. ICD (St. Jude Protecta dual-chamber),secondary prevention (VF arrest) January 2012   3. Chronic systolic heart failure (HCC)   4. Atrial fibrillation, unspecified type (HCC)   5. Long term (current) use of anticoagulants   6. HYPERTENSION, BENIGN SYSTEMIC   7. Type 2 diabetes, uncontrolled, with neuropathy (HCC)   8. CKD (chronic kidney disease) stage 2, GFR 60-89 ml/min   9. HYPERCHOLESTEROLEMIA   10. Obesity (BMI 30-39.9)   11. Sleep apnea   12. Visit for monitoring Tikosyn therapy   13. Preoperative cardiovascular examination    1. VT: no recurrence. Increase the beta blocker dose and stop the digoxin at her last appointment. QT interval is appropriately prolonged for treatment with dofetilide, not excessive. 2. ICD: Normal device function. ATP during charge was not successful. Discussed with EP, agreed with discontinuing digoxin and increasing beta blocker. Will recheck her device remotely in 3 months. 3. CHF: Appears well compensated hemodynamically, NYHA functional class I, no clinical signs of hypervolemia. Thoracic impedance is in normal range. 4. PAF: On appropriate warfarin anticoagulation. After she recovered from her knee surgery the burden of atrial fibrillation has again minutes to previous baseline of roughly 1%, asymptomatic. 5. Warfarin anticoagulation: No bleeding complications and no history of embolic TIA/stroke. CHADVasc 4 (CHF, gender, HTN, DM). 6. HTN: controlled 7. DM on long-term insulin therapy. Most recent A1c was elevated at 9.6%. 8. CKD: Recheck labs at least every 6 months while on dofetilide 9. HLP: Lipid profile last November was quite acceptable with LDL of 52, HDL 47, normal triglycerides 10. Weight loss again encouraged 11. OSA: She states that she is compliant with CPAP 12. Tikosyn: QT interval in the expected/desired range. Reviewed the importance of being aware of potential  drug interactions whenever she receives a new prescription or the dose of an old prescription change. This also applies to her warfarin. 13. Although she has numerous serious cardiac problems, recently these have all been well compensated. I think she is at moderate risk for major cardiovascular complications with the planned surgery. She tolerated her last orthopedic procedure without complications other than an increase in the burden of atrial fibrillation in the month or 2 after surgery.     PLAN:  In order of problems listed above:  Fascinating. Have not seen one there before. Medication Adjustments/Labs and Tests Ordered: Current medicines are reviewed at length with the patient today.  Concerns regarding medicines are outlined above.  Medication changes, Labs and Tests ordered today are listed in the Patient Instructions below. Patient Instructions  Medication Instructions: Dr Royann Shivers recommends that you continue on your current medications as directed. Please refer to the Current Medication list given to you today.  Labwork: NONE ORDERED  Testing/Procedures: 1. Remote Pacemaker Download - Remote monitoring is used to monitor your Pacemaker of ICD from home. This monitoring reduces the number of office visits required to check your device to one time per year. It allows [place] to keep an eye on the functioning of your device to ensure it is working properly. You are scheduled for a device check from home on Friday, August 11th, 2017. You may send your transmission at any time that day. If you have a wireless device, the transmission will be sent automatically. After your physician reviews your transmission, you will receive a postcard with your next transmission date.  Follow-up: Dr Royann Shivers recommends that you schedule a follow-up appointment in 6 months with a pacemaker check. You will receive a  reminder letter in the mail two months in advance. If you don't receive a letter, please call  our office to schedule the follow-up appointment.  If you need a refill on your cardiac medications before your next appointment, please call your pharmacy.       Joie Bimler, MD  08/22/2015 1:17 PM    [facility] [address] [place], [place], [place]  [address] Phone: [phone]; Fax: [phone]

## 2015-08-21 NOTE — Patient Instructions (Signed)
Medication Instructions: Dr Royann Shivers recommends that you continue on your current medications as directed. Please refer to the Current Medication list given to you today.  Labwork: NONE ORDERED  Testing/Procedures: 1. Remote Pacemaker Download - Remote monitoring is used to monitor your Pacemaker of ICD from home. This monitoring reduces the number of office visits required to check your device to one time per year. It allows [place] to keep an eye on the functioning of your device to ensure it is working properly. You are scheduled for a device check from home on Friday, August 11th, 2017. You may send your transmission at any time that day. If you have a wireless device, the transmission will be sent automatically. After your physician reviews your transmission, you will receive a postcard with your next transmission date.  Follow-up: Dr Royann Shivers recommends that you schedule a follow-up appointment in 6 months with a pacemaker check. You will receive a reminder letter in the mail two months in advance. If you don't receive a letter, please call our office to schedule the follow-up appointment.  If you need a refill on your cardiac medications before your next appointment, please call your pharmacy.

## 2015-08-27 ENCOUNTER — Encounter: Payer: Self-pay | Admitting: Family Medicine

## 2015-08-27 NOTE — Progress Notes (Signed)
Patient ID: [name], female   DOB: 1950/05/26, 65 y.o.   MRN: [id number]  Requests clearance for knee replacement. Medically cleared for prior knee replacement 08/2014. Will need clearance by Pulmonology and Cardiology before proceeding with surgery. Form filled out and returned to [employer] [facility] [employer].

## 2015-08-31 LAB — CUP PACEART INCLINIC DEVICE CHECK
Brady Statistic AS VS Percent: 3.4 %
HIGH POWER IMPEDANCE MEASURED VALUE: 342 Ohm
HighPow Impedance: 68 Ohm
Implantable Lead Implant Date: 20120120
Implantable Lead Location: 753859
Implantable Lead Model: 6935
Lead Channel Impedance Value: 418 Ohm
Lead Channel Pacing Threshold Amplitude: 0.375 V
Lead Channel Pacing Threshold Pulse Width: 0.4 ms
Lead Channel Sensing Intrinsic Amplitude: 1.5 mV
Lead Channel Sensing Intrinsic Amplitude: 1.5 mV
Lead Channel Setting Pacing Amplitude: 2.5 V
MDC IDC LEAD IMPLANT DT: 20120120
MDC IDC LEAD LOCATION: 753860
MDC IDC MSMT BATTERY VOLTAGE: 2.94 V
MDC IDC MSMT LEADCHNL RA IMPEDANCE VALUE: 418 Ohm
MDC IDC MSMT LEADCHNL RA PACING THRESHOLD PULSEWIDTH: 0.4 ms
MDC IDC MSMT LEADCHNL RV PACING THRESHOLD AMPLITUDE: 0.625 V
MDC IDC MSMT LEADCHNL RV SENSING INTR AMPL: 12.375 mV
MDC IDC MSMT LEADCHNL RV SENSING INTR AMPL: 12.375 mV
MDC IDC SESS DTM: 20170510130043
MDC IDC SET LEADCHNL RA PACING AMPLITUDE: 2 V
MDC IDC SET LEADCHNL RV PACING PULSEWIDTH: 0.4 ms
MDC IDC SET LEADCHNL RV SENSING SENSITIVITY: 0.3 mV
MDC IDC STAT BRADY AP VP PERCENT: 0.65 %
MDC IDC STAT BRADY AP VS PERCENT: 95.9 %
MDC IDC STAT BRADY AS VP PERCENT: 0.04 %
MDC IDC STAT BRADY RA PERCENT PACED: 96.55 %
MDC IDC STAT BRADY RV PERCENT PACED: 0.69 %

## 2015-09-02 ENCOUNTER — Ambulatory Visit (INDEPENDENT_AMBULATORY_CARE_PROVIDER_SITE_OTHER): Payer: Medicare Other | Admitting: Family Medicine

## 2015-09-02 ENCOUNTER — Other Ambulatory Visit (HOSPITAL_COMMUNITY)
Admission: RE | Admit: 2015-09-02 | Discharge: 2015-09-02 | Disposition: A | Discharge status: Home / Self Care | Payer: Medicare Other | Source: Ambulatory Visit | Attending: Family Medicine | Admitting: Family Medicine

## 2015-09-02 VITALS — BP 111/74 | HR 80 | Temp 98.1°F | Ht 66.0 in | Wt 203.5 lb

## 2015-09-02 DIAGNOSIS — Z01419 Encounter for gynecological examination (general) (routine) without abnormal findings: Secondary | ICD-10-CM | POA: Diagnosis not present

## 2015-09-02 DIAGNOSIS — E1140 Type 2 diabetes mellitus with diabetic neuropathy, unspecified: Secondary | ICD-10-CM

## 2015-09-02 DIAGNOSIS — E1165 Type 2 diabetes mellitus with hyperglycemia: Secondary | ICD-10-CM | POA: Diagnosis not present

## 2015-09-02 DIAGNOSIS — Z0000 Encounter for general adult medical examination without abnormal findings: Secondary | ICD-10-CM | POA: Diagnosis not present

## 2015-09-02 DIAGNOSIS — Z114 Encounter for screening for human immunodeficiency virus [HIV]: Secondary | ICD-10-CM | POA: Diagnosis not present

## 2015-09-02 DIAGNOSIS — N182 Chronic kidney disease, stage 2 (mild): Secondary | ICD-10-CM | POA: Diagnosis not present

## 2015-09-02 DIAGNOSIS — Z1159 Encounter for screening for other viral diseases: Secondary | ICD-10-CM | POA: Diagnosis not present

## 2015-09-02 DIAGNOSIS — IMO0002 Reserved for concepts with insufficient information to code with codable children: Secondary | ICD-10-CM

## 2015-09-02 DIAGNOSIS — Z124 Encounter for screening for malignant neoplasm of cervix: Secondary | ICD-10-CM

## 2015-09-02 DIAGNOSIS — Z1151 Encounter for screening for human papillomavirus (HPV): Secondary | ICD-10-CM | POA: Diagnosis not present

## 2015-09-02 LAB — BASIC METABOLIC PANEL WITH GFR
BUN: 21 mg/dL (ref 7–25)
CHLORIDE: 100 mmol/L (ref 98–110)
CO2: 31 mmol/L (ref 20–31)
Calcium: 8.5 mg/dL — ABNORMAL LOW (ref 8.6–10.4)
Creat: 1.09 mg/dL — ABNORMAL HIGH (ref 0.50–0.99)
GFR, EST AFRICAN AMERICAN: 62 mL/min (ref >=60)
GFR, Est Non African American: 54 mL/min — ABNORMAL LOW (ref >=60)
GLUCOSE: 160 mg/dL — AB (ref 65–99)
POTASSIUM: 4 mmol/L (ref 3.5–5.3)
Sodium: 138 mmol/L (ref 135–146)

## 2015-09-02 LAB — POCT GLYCOSYLATED HEMOGLOBIN (HGB A1C): Hemoglobin A1C: 10

## 2015-09-02 MED ORDER — INSULIN ASPART 100 UNIT/ML FLEXPEN: PEN_INJECTOR | SUBCUTANEOUS | Status: DC

## 2015-09-02 NOTE — Patient Instructions (Signed)
Thank you so much for coming to visit today! Your A1C is 10 today. We will increase your Novolog to 10units at lunch 7units at bedtime. Please continue Lantus 50units daily. Please schedule an appointment with the pharmacist in our clinic, Dr. Raymondo Band, to review your diabetes medications. I will also give you the business card of Dr. Gerilyn Pilgrim, our nutritionist, and you may call and schedule an appointment with her as well if you like.  We will check several labs today. You will be contacted with the results.   Please return in 3 months.  Thanks again! Dr. Caroleen Hamman  Diabetes Mellitus and Food It is important for you to manage your blood sugar (glucose) level. Your blood glucose level can be greatly affected by what you eat. Eating healthier foods in the appropriate amounts throughout the day at about the same time each day will help you control your blood glucose level. It can also help slow or prevent worsening of your diabetes mellitus. Healthy eating may even help you improve the level of your blood pressure and reach or maintain a healthy weight.  General recommendations for healthful eating and cooking habits include:  Eating meals and snacks regularly. Avoid going long periods of time without eating to lose weight.  Eating a diet that consists mainly of plant-based foods, such as fruits, vegetables, nuts, legumes, and whole grains.  Using low-heat cooking methods, such as baking, instead of high-heat cooking methods, such as deep frying. Work with your dietitian to make sure you understand how to use the Nutrition Facts information on food labels. HOW CAN FOOD AFFECT ME? Carbohydrates Carbohydrates affect your blood glucose level more than any other type of food. Your dietitian will help you determine how many carbohydrates to eat at each meal and teach you how to count carbohydrates. Counting carbohydrates is important to keep your blood glucose at a healthy level, especially if you are using  insulin or taking certain medicines for diabetes mellitus. Alcohol Alcohol can cause sudden decreases in blood glucose (hypoglycemia), especially if you use insulin or take certain medicines for diabetes mellitus. Hypoglycemia can be a life-threatening condition. Symptoms of hypoglycemia (sleepiness, dizziness, and disorientation) are similar to symptoms of having too much alcohol.  If your health care provider has given you approval to drink alcohol, do so in moderation and use the following guidelines:  Women should not have more than one drink per day, and men should not have more than two drinks per day. One drink is equal to:  12 oz of beer.  5 oz of wine.  1 oz of hard liquor.  Do not drink on an empty stomach.  Keep yourself hydrated. Have water, diet soda, or unsweetened iced tea.  Regular soda, juice, and other mixers might contain a lot of carbohydrates and should be counted. WHAT FOODS ARE NOT RECOMMENDED? As you make food choices, it is important to remember that all foods are not the same. Some foods have fewer nutrients per serving than other foods, even though they might have the same number of calories or carbohydrates. It is difficult to get your body what it needs when you eat foods with fewer nutrients. Examples of foods that you should avoid that are high in calories and carbohydrates but low in nutrients include:  Trans fats (most processed foods list trans fats on the Nutrition Facts label).  Regular soda.  Juice.  Candy.  Sweets, such as cake, pie, doughnuts, and cookies.  Fried foods. WHAT FOODS CAN I  EAT? Eat nutrient-rich foods, which will nourish your body and keep you healthy. The food you should eat also will depend on several factors, including:  The calories you need.  The medicines you take.  Your weight.  Your blood glucose level.  Your blood pressure level.  Your cholesterol level. You should eat a variety of foods,  including:  Protein.  Lean cuts of meat.  Proteins low in saturated fats, such as fish, egg whites, and beans. Avoid processed meats.  Fruits and vegetables.  Fruits and vegetables that may help control blood glucose levels, such as apples, mangoes, and yams.  Dairy products.  Choose fat-free or low-fat dairy products, such as milk, yogurt, and cheese.  Grains, bread, pasta, and rice.  Choose whole grain products, such as multigrain bread, whole oats, and brown rice. These foods may help control blood pressure.  Fats.  Foods containing healthful fats, such as nuts, avocado, olive oil, canola oil, and fish. DOES EVERYONE WITH DIABETES MELLITUS HAVE THE SAME MEAL PLAN? Because every person with diabetes mellitus is different, there is not one meal plan that works for everyone. It is very important that you meet with a dietitian who will help you create a meal plan that is just right for you.   This information is not intended to replace advice given to you by your health care provider. Make sure you discuss any questions you have with your health care provider.   Document Released: 12/25/2004 Document Revised: 04/20/2014 Document Reviewed: 02/24/2013 Elsevier Interactive Patient Education [employer]

## 2015-09-03 LAB — HEPATITIS C ANTIBODY: HCV AB: NEGATIVE

## 2015-09-03 LAB — HIV ANTIBODY (ROUTINE TESTING W REFLEX): HIV 1&2 Ab, 4th Generation: NONREACTIVE

## 2015-09-03 LAB — PROTEIN / CREATININE RATIO, URINE
CREATININE, URINE: 119 mg/dL (ref 20–320)
Protein Creatinine Ratio: 42 mg/g creat (ref 21–161)
Total Protein, Urine: 5 mg/dL (ref 5–24)

## 2015-09-04 LAB — CYTOLOGY - PAP

## 2015-09-05 NOTE — Assessment & Plan Note (Signed)
-   A1C increased today to 10 from 9.6 in 04/2015 - Very hesitant to change insulin regimen. Agrees to moving Lantus to morning dosing to better improve afternoon coverage; continue 50unit dosing. Agrees to increase supper dosing of Novolog to 7 units. Agrees to phone call in 2 weeks to discuss blood sugars on new regimen and consider further titrating insulin. - Encouraged follow up with Pharmacy and Nutrition - Will check BMP, Protein-Creatinine Ratio today - Neuropathy noted. Already follows with Podiatry. Nonpainful. - Follow up in 3 months.

## 2015-09-05 NOTE — Assessment & Plan Note (Signed)
-   Will obtain BMP to monitor

## 2015-09-05 NOTE — Progress Notes (Signed)
Subjective:     Patient ID: [name], female   DOB: 1950-12-20, 65 y.o.   MRN: [id number]  HPI Mrs. Delcastillo is a 65yo female presenting today for diabetes follow up. - Brought blood sugar logs. Morning fasting 79-130, lunch 70-108, after dinner 160s, prior to bed 154-185. - Currently prescribed Lantus 50units (takes at night) and Novolog 10units at lunch and 5 units with supper. Hesitant about changing insulin. - Last A1C 9.6 in 04/2015 - Was unable to give urine for protein check at last office visit. Unsure if she can give it today, but will try. - Last seen by ophthalmology 02/2015 - Last seen by Podiatry 04/2015 - Reports she has continued to work on diet. Interested in meeting with Nutrition.  # Health Maintenance: - Due for pap smear. Should be last pap smear if normal! - Last BMP with acute kidney injury in 05/2015 - Has never had screening for Hepatitis C or HIV. Agreeable to screening today.  Review of Systems Per HPI. Other systems negative.    Objective:   Physical Exam  Constitutional: She appears well-nourished. No distress.  Cardiovascular: Normal rate and regular rhythm.   No murmur heard. Pedal pulses palpable bilaterally  Pulmonary/Chest: Effort normal. No respiratory distress. She has no wheezes.  Abdominal: Soft. She exhibits no distension. There is no tenderness.  Musculoskeletal: She exhibits no edema.  Neurological:  Decreased sensation over feet bilaterally  Skin:  No ulcers or calluses noted on feet bilaterally  Psychiatric: She has a normal mood and affect. Her behavior is normal.      Assessment and Plan:     Type 2 diabetes, uncontrolled, with neuropathy - A1C increased today to 10 from 9.6 in 04/2015 - Very hesitant to change insulin regimen. Agrees to moving Lantus to morning dosing to better improve afternoon coverage; continue 50unit dosing. Agrees to increase supper dosing of Novolog to 7 units. Agrees to phone call in 2 weeks to discuss blood  sugars on new regimen and consider further titrating insulin. - Encouraged follow up with Pharmacy and Nutrition - Will check BMP, Protein-Creatinine Ratio today - Neuropathy noted. Already follows with Podiatry. Nonpainful. - Follow up in 3 months.  CKD (chronic kidney disease) stage 2, GFR 60-89 ml/min - Will obtain BMP to monitor  Health maintenance examination - Pap Smear today. Should be last one if normal! - Will screen for HIV and Hepatitis C

## 2015-09-05 NOTE — Assessment & Plan Note (Signed)
-   Pap Smear today. Should be last one if normal! - Will screen for HIV and Hepatitis C

## 2015-09-10 LAB — CUP PACEART INCLINIC DEVICE CHECK
Brady Statistic AP VS Percent: 94.13 %
Brady Statistic AS VP Percent: 0.15 %
Brady Statistic AS VS Percent: 4.75 %
Brady Statistic RA Percent Paced: 95.11 %
HighPow Impedance: 342 Ohm
HighPow Impedance: 68 Ohm
Implantable Lead Implant Date: 20120120
Implantable Lead Implant Date: 20120120
Implantable Lead Location: 753859
Lead Channel Impedance Value: 399 Ohm
Lead Channel Pacing Threshold Amplitude: 0.625 V
Lead Channel Pacing Threshold Pulse Width: 0.4 ms
Lead Channel Pacing Threshold Pulse Width: 0.4 ms
Lead Channel Sensing Intrinsic Amplitude: 1.625 mV
Lead Channel Sensing Intrinsic Amplitude: 11.875 mV
Lead Channel Setting Pacing Amplitude: 2 V
Lead Channel Setting Pacing Amplitude: 2.5 V
Lead Channel Setting Pacing Pulse Width: 0.4 ms
MDC IDC LEAD LOCATION: 753860
MDC IDC LEAD MODEL: 6935
MDC IDC MSMT BATTERY VOLTAGE: 2.95 V
MDC IDC MSMT LEADCHNL RA PACING THRESHOLD AMPLITUDE: 0.375 V
MDC IDC MSMT LEADCHNL RA SENSING INTR AMPL: 1.625 mV
MDC IDC MSMT LEADCHNL RV IMPEDANCE VALUE: 456 Ohm
MDC IDC MSMT LEADCHNL RV SENSING INTR AMPL: 11.875 mV
MDC IDC SESS DTM: 20170208213201
MDC IDC SET LEADCHNL RV SENSING SENSITIVITY: 0.3 mV
MDC IDC STAT BRADY AP VP PERCENT: 0.98 %
MDC IDC STAT BRADY RV PERCENT PACED: 1.13 %

## 2015-09-16 ENCOUNTER — Other Ambulatory Visit: Payer: Self-pay | Admitting: *Deleted

## 2015-09-16 MED ORDER — INSULIN PEN NEEDLE 31G X 8 MM MISC: Status: DC

## 2015-09-17 MED ORDER — INSULIN PEN NEEDLE 31G X 8 MM MISC: Status: DC

## 2015-09-17 NOTE — Addendum Note (Signed)
Addended by: Clovis Pu on: 09/17/2015 02:18 PM   Modules accepted: Orders

## 2015-09-17 NOTE — Telephone Encounter (Signed)
Received another refill request for needles.  Refill was approved but stated No Print. Refill resent electronically. Clovis Pu, RN

## 2015-09-18 ENCOUNTER — Ambulatory Visit (INDEPENDENT_AMBULATORY_CARE_PROVIDER_SITE_OTHER): Payer: Medicare Other | Admitting: Pharmacist Clinician (PhC)/ Clinical Pharmacy Specialist

## 2015-09-18 ENCOUNTER — Telehealth: Payer: Self-pay | Admitting: Pharmacist Clinician (PhC)/ Clinical Pharmacy Specialist

## 2015-09-18 DIAGNOSIS — Z7901 Long term (current) use of anticoagulants: Secondary | ICD-10-CM | POA: Diagnosis not present

## 2015-09-18 DIAGNOSIS — I4891 Unspecified atrial fibrillation: Secondary | ICD-10-CM

## 2015-09-18 LAB — POCT INR: INR: 2.5

## 2015-09-18 MED ORDER — TORSEMIDE 10 MG PO TABS: ORAL_TABLET | ORAL | Status: DC

## 2015-09-18 NOTE — Telephone Encounter (Signed)
Patient notes weight up from 202 (dry weight) to 207 in past week.  Edema present in calf of left leg and notes some abdominal tightness.  She has occasionally taken extra 20 mg of toresemide (normal dose 20 mg qam), but weight and edema are unchanged.  Advised patient to take 20 mg torsemide bid x 3 days.  Continue with daily weights at home.  If weight does not come down by Monday she is to call office and speak with triage.  Also she will need to call if weight goes down, but then back up after resuming her normal daily dose.  Patient voiced understanding

## 2015-09-20 ENCOUNTER — Encounter: Payer: Self-pay | Admitting: Cardiovascular Disease

## 2015-09-23 ENCOUNTER — Telehealth: Payer: Self-pay | Admitting: Cardiovascular Disease

## 2015-09-23 DIAGNOSIS — Z79899 Other long term (current) drug therapy: Secondary | ICD-10-CM

## 2015-09-23 NOTE — Telephone Encounter (Signed)
NEW MESSAGE  Pt c/o swelling: STAT is pt has developed SOB within 24 hours  1. How long have you been experiencing swelling?  Last week  2. Where is the swelling located? Stomach legs 3.  Are you currently taking a "fluid pill"? yes  4.  Are you currently SOB? Not now but did 5.  Have you traveled recently? no

## 2015-09-23 NOTE — Telephone Encounter (Signed)
Returned call to pt. She say her weight today is 205lb and her normal is 202lb. She was up as high as 209lb last Wednesday. She saw Belenda Cruise, Pharmacist, last Wednesday 09/18/15, who advised her to take torsemide 20 mg BID on Wednesday , Thursday, and Friday, which pt did. Since Saturday, she has taken her normal dose of torsemide 20mg  daily. Patient says her "stomach feels bloated and swollen." Her ankles are swollen to where it leaves an impression of her fingers when she presses into them. She denies SOB, CP or palpitations. Advised pt to take an extra torsemide 20mg  now and that I would route this to MD for further advice. Pt verbalized understanding to go to the emergency room or call 911 if the she develops any new or worsening symptoms, such as CP, SOB, nausea, vomiting, or sweating.  Routing to Dr [employer].

## 2015-09-23 NOTE — Telephone Encounter (Signed)
I agree with that advice. Also ask her to increase spironolactone to 25 mg (a whole tab) daily and check ECG and BMET on Thursday. She is on Tikosyn and needs careful monitoring (has had long QT related proarrhythmia before). Thanks

## 2015-09-24 DIAGNOSIS — Z79899 Other long term (current) drug therapy: Secondary | ICD-10-CM | POA: Diagnosis not present

## 2015-09-24 MED ORDER — SPIRONOLACTONE 25 MG PO TABS
25.0000 mg | ORAL_TABLET | Freq: Every day | ORAL | Status: DC
Start: 2015-09-24 — End: 2016-01-03

## 2015-09-24 NOTE — Telephone Encounter (Signed)
Returned call to pt. Gave her recommendations. She verbalized understanding to take 25 mg spironolactone daily and to repeat blood work Thursday and come into office for an EKG check on Thursday.

## 2015-09-26 ENCOUNTER — Ambulatory Visit (INDEPENDENT_AMBULATORY_CARE_PROVIDER_SITE_OTHER): Payer: Medicare Other | Admitting: *Deleted

## 2015-09-26 ENCOUNTER — Other Ambulatory Visit: Payer: Self-pay | Admitting: Cardiovascular Disease

## 2015-09-26 ENCOUNTER — Encounter: Payer: Self-pay | Admitting: Family Medicine

## 2015-09-26 VITALS — BP 110/82 | HR 74 | Ht 67.0 in | Wt 205.2 lb

## 2015-09-26 DIAGNOSIS — Z5181 Encounter for therapeutic drug level monitoring: Secondary | ICD-10-CM

## 2015-09-26 DIAGNOSIS — Z79899 Other long term (current) drug therapy: Secondary | ICD-10-CM

## 2015-09-26 NOTE — Patient Instructions (Signed)
Continue current therapy.  No changes 

## 2015-09-26 NOTE — Progress Notes (Signed)
1.) Reason for visit: EKG check, pt on Tikosyn therapy, recent increase in spironolactone to 25mg  daily.  2.) Name of MD requesting visit: Dr Royann Shivers  3.) H&P: hx. tikosyn therapy for a. Fib, defibrillator.   4.) ROS related to problem: Patient states she does not feel as swollen in her stomach. Her ankle swelling is trace and pt states it has improvement significantly. BP 110/82, HR 77.  5.) Assessment and plan per MD: EKG shown to Dr Rennis Golden, DOD. He noted stable QTC today. Continue with current therapy, no changes.

## 2015-09-27 ENCOUNTER — Telehealth: Payer: Self-pay

## 2015-09-27 ENCOUNTER — Telehealth: Payer: Self-pay | Admitting: *Deleted

## 2015-09-27 LAB — BASIC METABOLIC PANEL
BUN: 18 mg/dL (ref 7–25)
CO2: 27 mmol/L (ref 20–31)
Calcium: 8.7 mg/dL (ref 8.6–10.4)
Chloride: 100 mmol/L (ref 98–110)
Creat: 1.06 mg/dL — ABNORMAL HIGH (ref 0.50–0.99)
GLUCOSE: 224 mg/dL — AB (ref 65–99)
POTASSIUM: 4.5 mmol/L (ref 3.5–5.3)
SODIUM: 135 mmol/L (ref 135–146)

## 2015-09-27 NOTE — Telephone Encounter (Signed)
Referred to [facility] clinic by Dr Royann Shivers and Trudi Ida, device tech.  Spoke with patient and explained ICM program.  She agreed to monthly calls.  She had fluid symptoms of weight gain, stomach and ankle swelling in the past week which has been addressed by taking extra Torsemide as ordered.  Provide my number and encouraged her to call for fluid symptoms.  1st ICM transmission scheduled for 10/08/2015.  She has a home monitor and knows how to send manual transmission if needed.

## 2015-09-29 ENCOUNTER — Other Ambulatory Visit: Payer: Self-pay | Admitting: Pulmonary Disease

## 2015-09-30 MED ORDER — INSULIN GLARGINE 100 UNIT/ML SOLOSTAR PEN: 50.0000 [IU] | PEN_INJECTOR | Freq: Every day | SUBCUTANEOUS | Status: DC

## 2015-09-30 NOTE — Telephone Encounter (Signed)
Needs refill on lantus solostar pen

## 2015-10-01 MED ORDER — INSULIN GLARGINE 100 UNIT/ML SOLOSTAR PEN: 50.0000 [IU] | PEN_INJECTOR | Freq: Every day | SUBCUTANEOUS | Status: DC

## 2015-10-01 NOTE — Telephone Encounter (Signed)
Patient called stating that her Lantus refill was not sent to [facility]. Rx for Lantus SoloStar was approved by PCP 09/30/15, however stated No Print.  Rx resent electronically.  Clovis Pu, RN

## 2015-10-01 NOTE — Addendum Note (Signed)
Addended by: Clovis Pu on: 10/01/2015 12:26 PM   Modules accepted: Orders

## 2015-10-07 ENCOUNTER — Other Ambulatory Visit: Payer: Self-pay | Admitting: Family Medicine

## 2015-10-07 DIAGNOSIS — Z794 Long term (current) use of insulin: Principal | ICD-10-CM

## 2015-10-07 DIAGNOSIS — E119 Type 2 diabetes mellitus without complications: Secondary | ICD-10-CM

## 2015-10-08 ENCOUNTER — Ambulatory Visit (INDEPENDENT_AMBULATORY_CARE_PROVIDER_SITE_OTHER): Payer: Medicare Other

## 2015-10-08 DIAGNOSIS — Z95810 Presence of automatic (implantable) cardiac defibrillator: Secondary | ICD-10-CM | POA: Diagnosis not present

## 2015-10-08 DIAGNOSIS — I5022 Chronic systolic (congestive) heart failure: Secondary | ICD-10-CM | POA: Diagnosis not present

## 2015-10-08 NOTE — Progress Notes (Signed)
EPIC Encounter for ICM Monitoring  Patient Name: [name] is a 65 y.o. female Date: 10/08/2015 Primary Care Physican: Garry Heater, DO Primary Cardiologist: Croitoru Electrophysiologist: Croitoru Dry Weight: 205 lbs       In the past month, have you:  1. Gained more than 2 pounds in a day or more than 5 pounds in a week?   She has had some weight gain but has taken extra Torsemide.  Her dry weight is 202 lbs  2. Had changes in your medications (with verification of current medications)? No  3. Had more shortness of breath than is usual for you? No   4. Limited your activity because of shortness of breath? No   5. Not been able to sleep because of shortness of breath? No   6. Had increased swelling in your feet, ankles, legs or stomach area?  Had some stomach swelling on 09/27/2015  7. Had symptoms of dehydration (dizziness, dry mouth, increased thirst, decreased urine output) No   8. Had changes in sodium restriction? No   9. Been compliant with medication? Yes   ICM trend: 3 month view for 10/08/2015   ICM trend: 1 year view for 10/08/2015   Follow-up plan: ICM clinic phone appointment 11/25/2015.  1st ICM encounter.  FLUID LEVELS: Optivol thoracic impedance decreased 08/31/2015 to 09/17/2015 suggesting fluid accumulation and returned to baseline suggesting fluid stablizing.    SYMPTOMS:  She reported, on 09/27/2015, weight gain, stomach and ankle swelling in the past week which has been addressed by taking extra Torsemide as ordered.   EDUCATION:  She reported she tries very hard to limit her sodium.  She reviews sodium labels and tries to limit her meals to around 600mg .         RECOMMENDATIONS: No changes today.  She manages her fluid symptoms with taking extra Torsemide as ordered.  She will be having total knee replacement surgery 11/18/2015 and advised to call if she has fluid symptoms after her hospital discharge.     [name], RN, CCM 10/08/2015 2:12  PM

## 2015-10-16 ENCOUNTER — Ambulatory Visit (INDEPENDENT_AMBULATORY_CARE_PROVIDER_SITE_OTHER): Payer: Medicare Other | Admitting: Pharmacist

## 2015-10-16 DIAGNOSIS — Z7901 Long term (current) use of anticoagulants: Secondary | ICD-10-CM | POA: Diagnosis not present

## 2015-10-16 DIAGNOSIS — I4891 Unspecified atrial fibrillation: Secondary | ICD-10-CM

## 2015-10-16 LAB — POCT INR: INR: 2.4

## 2015-10-24 ENCOUNTER — Ambulatory Visit: Payer: Medicare Other | Admitting: Family Medicine

## 2015-10-29 ENCOUNTER — Encounter: Payer: Medicare Other | Attending: Family Medicine | Admitting: Nutrition

## 2015-10-29 ENCOUNTER — Other Ambulatory Visit: Payer: Self-pay | Admitting: Pulmonary Disease

## 2015-10-29 ENCOUNTER — Telehealth: Payer: Self-pay | Admitting: Cardiovascular Disease

## 2015-10-29 DIAGNOSIS — E1140 Type 2 diabetes mellitus with diabetic neuropathy, unspecified: Secondary | ICD-10-CM

## 2015-10-29 DIAGNOSIS — IMO0002 Reserved for concepts with insufficient information to code with codable children: Secondary | ICD-10-CM

## 2015-10-29 DIAGNOSIS — E119 Type 2 diabetes mellitus without complications: Secondary | ICD-10-CM | POA: Insufficient documentation

## 2015-10-29 DIAGNOSIS — Z794 Long term (current) use of insulin: Secondary | ICD-10-CM | POA: Diagnosis not present

## 2015-10-29 DIAGNOSIS — E1165 Type 2 diabetes mellitus with hyperglycemia: Secondary | ICD-10-CM

## 2015-10-29 MED ORDER — METOPROLOL TARTRATE 50 MG PO TABS: 75.0000 mg | ORAL_TABLET | Freq: Two times a day (BID) | ORAL | Status: DC

## 2015-10-29 NOTE — Telephone Encounter (Signed)
Prescription sent to pharmacy.

## 2015-10-29 NOTE — Telephone Encounter (Signed)
New message       *STAT* If patient is at the pharmacy, call can be transferred to refill team.   1. Which medications need to be refilled? (please list name of each medication and dose if known) metoprolol 50mg  2. Which pharmacy/location (including street and city if local pharmacy) is medication to be sent to? [facility] @ [place]  3. Do they need a 30 day or 90 day supply? 90 day

## 2015-11-04 ENCOUNTER — Ambulatory Visit (INDEPENDENT_AMBULATORY_CARE_PROVIDER_SITE_OTHER): Payer: Medicare Other | Admitting: Pulmonary Disease

## 2015-11-04 ENCOUNTER — Encounter: Payer: Self-pay | Admitting: Pulmonary Disease

## 2015-11-04 VITALS — BP 122/76 | HR 86 | Ht 67.0 in | Wt 205.0 lb

## 2015-11-04 DIAGNOSIS — Z9989 Dependence on other enabling machines and devices: Secondary | ICD-10-CM

## 2015-11-04 DIAGNOSIS — J4540 Moderate persistent asthma, uncomplicated: Secondary | ICD-10-CM

## 2015-11-04 DIAGNOSIS — G4733 Obstructive sleep apnea (adult) (pediatric): Secondary | ICD-10-CM | POA: Diagnosis not present

## 2015-11-04 DIAGNOSIS — K219 Gastro-esophageal reflux disease without esophagitis: Secondary | ICD-10-CM | POA: Diagnosis not present

## 2015-11-04 LAB — PULMONARY FUNCTION TEST
DL/VA % pred: 82 %
DL/VA: 4.25 ml/min/mmHg/L
DLCO COR: 21.36 ml/min/mmHg
DLCO UNC % PRED: 73 %
DLCO UNC: 20.89 ml/min/mmHg
DLCO cor % pred: 75 %
FEF 25-75 PRE: 1.37 L/s
FEF 25-75 Post: 1.8 L/sec
FEF2575-%Change-Post: 31 %
FEF2575-%PRED-PRE: 64 %
FEF2575-%Pred-Post: 84 %
FEV1-%Change-Post: 7 %
FEV1-%PRED-PRE: 93 %
FEV1-%Pred-Post: 100 %
FEV1-POST: 2.26 L
FEV1-PRE: 2.1 L
FEV1FVC-%CHANGE-POST: 7 %
FEV1FVC-%Pred-Pre: 90 %
FEV6-%CHANGE-POST: 0 %
FEV6-%PRED-POST: 105 %
FEV6-%PRED-PRE: 105 %
FEV6-POST: 2.94 L
FEV6-Pre: 2.94 L
FEV6FVC-%CHANGE-POST: 0 %
FEV6FVC-%PRED-POST: 102 %
FEV6FVC-%Pred-Pre: 102 %
FVC-%Change-Post: 0 %
FVC-%Pred-Post: 102 %
FVC-%Pred-Pre: 102 %
FVC-Post: 2.95 L
FVC-Pre: 2.95 L
POST FEV6/FVC RATIO: 100 %
PRE FEV1/FVC RATIO: 71 %
Post FEV1/FVC ratio: 77 %
Pre FEV6/FVC Ratio: 100 %
RV % pred: 109 %
RV: 2.45 L
TLC % PRED: 109 %
TLC: 6.02 L

## 2015-11-04 NOTE — Patient Instructions (Signed)
   You can try decreasing your Pulmicort to 1 inhalation twice daily after your surgery. If you notice more coughing, wheezing, shortness of breath, or using your rescue medication more frequently go back to 2 inhalations twice daily.  Call me if you have any new breathing problems or questions before your next appointment.  I will see you back in 3 months or sooner if needed.

## 2015-11-04 NOTE — Progress Notes (Signed)
Subjective:    Patient ID: [name], female    DOB: [birth year], 64 y.o.   MRN: [id number]  C.C.:  Follow-up for Moderate, Persistent Asthma, GERD, & OSA.  HPI Moderate, Persistent Asthma:  Prescribed Pulmicort. She reports mild, intermittent coughing. She reports dyspnea seems to be ok as long as she avoids the extreme heat. She reports she uses her rescue medications intermittently. She is still compliant with her Budesonide twice daily. No nocturnal awakenings with any breathing problems.  GERD:  Prescribed Nexium. No reflux or dyspepsia. No abdominal pain.  OSA:  On CPAP therapy. Reports she is using it routinely. She does still nap occasionally during the day but forgets to use her CPAP. She reports she is getting good quality of sleep.   Review of Systems No chest pain or pressure. No fever, chills, or sweats. No rashes or abnormal bruising.   Allergies  Allergen Reactions  . Avelox [Moxifloxacin Hcl In Nacl] Other (See Comments)    Cardiac arrest per pt  . Simvastatin Other (See Comments)    myalgias  . Ace Inhibitors Cough    Dry cough   . Latex Rash    Current Outpatient Prescriptions on File Prior to Visit  Medication Sig Dispense Refill  . B-D ULTRAFINE III SHORT PEN 31G X 8 MM MISC use as directed 100 each 9  . budesonide (PULMICORT FLEXHALER) 180 MCG/ACT inhaler Inhale 2 puffs into the lungs 2 (two) times daily. 1 each 11  . buPROPion (WELLBUTRIN XL) 150 MG 24 hr tablet take 1 tablet by mouth once daily 90 tablet 3  . chlorpheniramine (CHLOR-TRIMETON) 4 MG tablet 8mg  at night 14 tablet 0  . CRESTOR 10 MG tablet take 1 tablet by mouth once daily 90 tablet 3  . dofetilide (TIKOSYN) 250 MCG capsule Take 250 mcg by mouth 2 (two) times daily. Reported on 09/26/2015    . fluticasone (FLONASE) 50 MCG/ACT nasal spray instill 1 spray into each nostril twice a day 16 g 5  . insulin aspart (NOVOLOG FLEXPEN) 100 UNIT/ML FlexPen Please inject 10units prior to lunch and 7units  prior to supper. 15 mL 11  . Insulin Glargine (LANTUS) 100 UNIT/ML Solostar Pen Inject 50 Units into the skin at bedtime. 15 mL 12  . Insulin Pen Needle (B-D ULTRAFINE III SHORT PEN) 31G X 8 MM MISC Use as directed 100 each 9  . levalbuterol (XOPENEX HFA) 45 MCG/ACT inhaler INHALE 1 TO 2 PUFFS BY MOUTH INTO THE LUNGS EVERY 4 HOURS IF NEEDED FOR WHEEZING 1 Inhaler 5  . levalbuterol (XOPENEX) 0.63 MG/3ML nebulizer solution One vial in nebulizer four times daily as needed dx 493.00 360 mL 4  . losartan (COZAAR) 50 MG tablet take 1 tablet by mouth once daily 90 tablet 3  . metoprolol (LOPRESSOR) 50 MG tablet Take 1.5 tablets (75 mg total) by mouth 2 (two) times daily. 270 tablet 3  . NEXIUM 40 MG capsule take 1 capsule by mouth once daily 30 capsule 5  . polyethylene glycol powder (GLYCOLAX/MIRALAX) powder take 17GM (DISSOLVED IN WATER) by mouth once daily PRN, takes three times a week 255 g 5  . RA LORATADINE 10 MG tablet take 1 tablet by mouth once daily 30 tablet 5  . spironolactone (ALDACTONE) 25 MG tablet Take 1 tablet (25 mg total) by mouth daily. 90 tablet 3  . torsemide (DEMADEX) 10 MG tablet Take 2 tablets by mouth once daily, may take additional 2 tablets as directed 80 tablet 5  .  warfarin (COUMADIN) 5 MG tablet take 1 and 1/2 by mouth once daily 45 tablet 5   No current facility-administered medications on file prior to visit.     Past Medical History:  Diagnosis Date  . AICD (automatic cardioverter/defibrillator) present    Medtronic  . Anemia   . Anxiety   . Arthritis   . Asthma    has had multiple hospitalizations for this  . Atrial fibrillation (HCC)    ablation x 2 [facility], 01/2006, 2011.  on warfarin  . Cardiac arrest [facility]) jan 2012   in hospital for pneumonia when this occured- occured at the hospital  . CHF (congestive heart failure) (HCC) 2012   Echo 08/08/10 by [facility]. EF 35-45%. LV systolic function moderately reduced. Moderate global hypokinesis of LV.  RV  systolic function moderately reduced. Mild MR. Trace AR.  Marland Kitchen Complication of anesthesia    difficult time waking up after anesthesia  . Depression   . Diabetes mellitus    Type 2  . Dysrhythmia    Atrial fibrillation  . GASTROESOPHAGEAL REFLUX, NO ESOPHAGITIS 06/10/2006   Qualifier: Diagnosis of  By: Abundio Miu    . GERD (gastroesophageal reflux disease)   . History of hiatal hernia   . Hyperlipidemia   . Hypertension   . Limb pain 06/04/2008   LLE, Baker's cyst in popliteal fossa, no DVT  . Non-ischemic cardiomyopathy (HCC)    echo 08/08/10 - EF 35-45% LV and RV systolic function mod reduced  . Primary localized osteoarthritis of left knee   . Sleep apnea    wears CPAP nightly  . Vocal cord disease     Past Surgical History:  Procedure Laterality Date  . BREAST LUMPECTOMY Right   . CARDIAC DEFIBRILLATOR PLACEMENT  05/02/10   Medtronic Protecta XT-DR for CHF-VT, last download 04/12/12  . CHOLECYSTECTOMY    . COLONOSCOPY    . HAMMER TOE SURGERY Right   . KNEE ARTHROSCOPY Right   . PAF Ablation     By Dr Sampson Goon. Now sees Dr Steele Berg at [facility]  . TOTAL KNEE ARTHROPLASTY Left 09/17/2014  . TOTAL KNEE ARTHROPLASTY Left 09/17/2014   Procedure: TOTAL KNEE ARTHROPLASTY;  Surgeon: Salvatore Marvel, MD;  Location: [facility] OR;  Service: Orthopedics;  Laterality: Left;  pt has ICD  . TUBAL LIGATION      Family History  Problem Relation Age of Onset  . Diabetes Father   . Hypertension Father   . Lung disease Neg Hx   . Cancer Neg Hx   . Rheumatologic disease Neg Hx     Social History   Social History  . Marital status: Widowed    Spouse name: N/A  . Number of children: N/A  . Years of education: N/A   Social History Main Topics  . Smoking status: Never Smoker  . Smokeless tobacco: Never Used  . Alcohol use No  . Drug use: No  . Sexual activity: No   Other Topics Concern  . None   Social History Narrative   Not employed   Exercise- walking 1 hour daily.    Diet-  eating healthy diet.       [facility]:   She is originally from [place] [facility] [place] [facility] [place]. Has always lived in [place]. 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 Currently has a small dog. No bird or mold exposure.       Objective:  Physical Exam  BP 122/76 (BP Location: Left Arm, Cuff Size: Normal)   Pulse 86   Ht  (1.702 m)   Wt 205 lb (93 kg)   LMP 07/26/2011   SpO2 98%   BMI 32.11 kg/m  General:  Awake. Alert. Appears comfortable.  Integument:  Warm & dry. No rash on exposed skin. No bruising. HEENT:  Moist mucus membranes. Mild bilateral nasal turbinate swelling. No scleral icterus.  Cardiovascular:  Regular rate. No edema. Normal S1 & S2. Pulmonary: Good aeration. Clear on auscultation. Speaking in complete sentences with normal work of breathing. Abdomen: Soft. Mildly protuberant. Nontender.  PFT 11/04/15: FVC 2.95 L (102%) FEV1 2.10 L (93%) FEV1/FVC 0.71 FEF 25-75 1.37 L (64%) no bronchodilator response TLC 6.02 L (109%) RV 109% ERV 47% DLCO uncorrected 75% (hgb 12.7) 12/06/08: FVC 3.43 L (99%) FEV1 2.46 L (94%) FEV1/FVC 0.72 FEF 25-75 1.59 L (85%) no bronchodilator response TLC 6.17 L (112%) RV 131% ERV 52% DLCO uncorrected 74%  IMAGING CXR PA/LAT 04/22/15 (previously reviewed by me): Tenting of left hemidiaphragm. No focal opacity or effusion appreciated. Heart normal in size & mediastinum normal in contour.  CXR PA/LAT 09/19/14 (previously reviewed by me): Questionable right basilar density/opacity. No pleural effusion appreciated. Heart normal in size. Mediastinum normal in contour.    Assessment & Plan:  65 year old female with underlying moderate, persistent asthma. Asthma seems to be well-controlled symptomatically. Her spirometry has significantly declined above what is expected physiologically but this is unclear and significance given a normal physiologic rate of decline in her  lung volumes and normal carbon monoxide diffusion capacity. I suspect this may be reflecting her underlying cardiac disease. Her reflux seems to be well-controlled at this time. I do question whether or not she may have some residual significant AHI despite use of her CPAP regularly. This will be reassessed after next appointment. I instructed the patient to contact my office if she had any new breathing, was before her next appointment. We also had a lengthy discussion regarding her potential perioperative risks which I feel are low with regards to her knee replacement surgery which she reports will be done without general anesthesia.  1. Moderate, Persistent Asthma: Continuing Pulmicort 2 puffs twice daily. Recommended she could try decreasing to 1 inhalation twice daily after her surgery. 2. GERD: Asymptomatic on Nexium. No changes at this time. 3. OSA: Excellent CPAP compliance. Consider download to ensure minimal residual AHI given daytime napping for next appointment. 4. Surgical Assessment: Patient is at low risk for perioperative pulmonary complications from her proposed knee replacement. She is at risk for CO2 retention with the use of sedating medications and may require noninvasive positive pressure ventilation. She can continue to use her current inhaler medications perioperatively and nebulizer form for improved ease of use. As always we recommend early ambulation and incentive spirometry to minimize atelectasis. 5. Health Maintenance:  Had influenza vaccine Fall 2016, Pneumovax 02/22/12, & Prevnar 2015. 6. Follow-up:  Return to clinic in 3 months or sooner if needed.  Donna Christen Jamison Neighbor, M.D. [facility] Pager:  [phone] After 3pm or if no response, call [phone] 11:18 AM 11/04/15

## 2015-11-04 NOTE — Progress Notes (Signed)
Test reviewed.  

## 2015-11-05 ENCOUNTER — Encounter (HOSPITAL_COMMUNITY): Payer: Self-pay | Admitting: Physician Assistant

## 2015-11-05 DIAGNOSIS — M1711 Unilateral primary osteoarthritis, right knee: Secondary | ICD-10-CM | POA: Diagnosis present

## 2015-11-05 NOTE — H&P (Signed)
TOTAL KNEE ADMISSION H&P  Patient is being admitted for right total knee arthroplasty.  Subjective:  Chief Complaint:right knee pain.  HPI: [name], 65 y.o. female, has a history of pain and functional disability in the right knee due to arthritis and has failed non-surgical conservative treatments for greater than 12 weeks to includeNSAID's and/or analgesics, corticosteriod injections, viscosupplementation injections, flexibility and strengthening excercises, supervised PT with diminished ADL's post treatment, use of assistive devices, weight reduction as appropriate and activity modification.  Onset of symptoms was gradual, starting 10 years ago with gradually worsening course since that time. The patient noted prior procedures on the knee to include  arthroscopy and menisectomy on the right knee(s).  Patient currently rates pain in the right knee(s) at 10 out of 10 with activity. Patient has night pain, worsening of pain with activity and weight bearing, pain that interferes with activities of daily living, crepitus and joint swelling.  Patient has evidence of subchondral sclerosis, periarticular osteophytes and joint space narrowing by imaging studies.  There is no active infection.  Patient Active Problem List   Diagnosis Date Noted  . Primary localized osteoarthritis of right knee   . Visit for monitoring Tikosyn therapy 08/21/2015  . Long term (current) use of anticoagulants 08/21/2015  . Preoperative cardiovascular examination 08/21/2015  . Ventricular tachycardia (HCC) 05/22/2015  . Obesity (BMI 30-39.9) 01/04/2014  . ICD (St. Jude Protecta dual-chamber),secondary prevention (VF arrest) January 2012 11/19/2012  . CKD (chronic kidney disease) stage 2, GFR 60-89 ml/min 02/22/2012  . Asthma, moderate persistent 12/10/2011  . Health maintenance examination 11/02/2011  . Diabetic neuropathy (HCC) 09/18/2011  . Chronic systolic heart failure (HCC) 05/09/2010  . OSTEOARTHRITIS, KNEE  08/28/2008  . DISEASE, VOCAL CORD NEC 10/13/2006  . Type 2 diabetes, uncontrolled, with neuropathy (HCC) 06/10/2006  . HYPERCHOLESTEROLEMIA 06/10/2006  . HYPERTENSION, BENIGN SYSTEMIC 06/10/2006  . Atrial fibrillation (HCC) 06/10/2006  . Sleep apnea 06/10/2006   Past Medical History:  Diagnosis Date  . AICD (automatic cardioverter/defibrillator) present    Medtronic  . Anemia   . Anxiety   . Arthritis   . Asthma    has had multiple hospitalizations for this  . Atrial fibrillation (HCC)    ablation x 2 [facility], 01/2006, 2011.  on warfarin  . Cardiac arrest [facility]) jan 2012   in hospital for pneumonia when this occured- occured at the hospital  . CHF (congestive heart failure) (HCC) 2012   Echo 08/08/10 by [facility]. EF 35-45%. LV systolic function moderately reduced. Moderate global hypokinesis of LV.  RV systolic function moderately reduced. Mild MR. Trace AR.  Marland Kitchen Complication of anesthesia    difficult time waking up after anesthesia  . Depression   . Diabetes mellitus    Type 2  . Dysrhythmia    Atrial fibrillation  . GASTROESOPHAGEAL REFLUX, NO ESOPHAGITIS 06/10/2006   Qualifier: Diagnosis of  By: Abundio Miu    . GERD (gastroesophageal reflux disease)   . History of hiatal hernia   . Hyperlipidemia   . Hypertension   . Limb pain 06/04/2008   LLE, Baker's cyst in popliteal fossa, no DVT  . Non-ischemic cardiomyopathy (HCC)    echo 08/08/10 - EF 35-45% LV and RV systolic function mod reduced  . Primary localized osteoarthritis of left knee   . Primary localized osteoarthritis of right knee   . Sleep apnea    wears CPAP nightly  . Vocal cord disease     Past Surgical History:  Procedure Laterality Date  . BREAST LUMPECTOMY Right   . CARDIAC DEFIBRILLATOR PLACEMENT  05/02/10   Medtronic Protecta XT-DR for CHF-VT, last download 04/12/12  . CHOLECYSTECTOMY    . COLONOSCOPY    . HAMMER TOE SURGERY Right   . KNEE ARTHROSCOPY Right   . PAF Ablation     By Dr  Sampson Goon. Now sees Dr Steele Berg at [facility]  . TOTAL KNEE ARTHROPLASTY Left 09/17/2014  . TOTAL KNEE ARTHROPLASTY Left 09/17/2014   Procedure: TOTAL KNEE ARTHROPLASTY;  Surgeon: Salvatore Marvel, MD;  Location: [facility] OR;  Service: Orthopedics;  Laterality: Left;  pt has ICD  . TUBAL LIGATION      No current facility-administered medications for this encounter.   Current Outpatient Prescriptions:  .  B-D ULTRAFINE III SHORT PEN 31G X 8 MM MISC, use as directed, Disp: 100 each, Rfl: 9 .  budesonide (PULMICORT FLEXHALER) 180 MCG/ACT inhaler, Inhale 2 puffs into the lungs 2 (two) times daily., Disp: 1 each, Rfl: 11 .  buPROPion (WELLBUTRIN XL) 150 MG 24 hr tablet, take 1 tablet by mouth once daily, Disp: 90 tablet, Rfl: 3 .  chlorpheniramine (CHLOR-TRIMETON) 4 MG tablet,  at night, Disp: 14 tablet, Rfl: 0 .  CRESTOR 10 MG tablet, take 1 tablet by mouth once daily, Disp: 90 tablet, Rfl: 3 .  dofetilide (TIKOSYN) 250 MCG capsule, Take 250 mcg by mouth 2 (two) times daily. Reported on 09/26/2015, Disp: , Rfl:  .  fluticasone (FLONASE) 50 MCG/ACT nasal spray, instill 1 spray into each nostril twice a day, Disp: 16 g, Rfl: 5 .  insulin aspart (NOVOLOG FLEXPEN) 100 UNIT/ML FlexPen, Please inject 10units prior to lunch and 7units prior to supper., Disp: 15 mL, Rfl: 11 .  Insulin Glargine (LANTUS) 100 UNIT/ML Solostar Pen, Inject 50 Units into the skin at bedtime. (Patient taking differently: Inject 50 Units into the skin every morning. ), Disp: 15 mL, Rfl: 12 .  Insulin Pen Needle (B-D ULTRAFINE III SHORT PEN) 31G X 8 MM MISC, Use as directed, Disp: 100 each, Rfl: 9 .  levalbuterol (XOPENEX HFA) 45 MCG/ACT inhaler, INHALE 1 TO 2 PUFFS BY MOUTH INTO THE LUNGS EVERY 4 HOURS IF NEEDED FOR WHEEZING, Disp: 1 Inhaler, Rfl: 5 .  levalbuterol (XOPENEX) 0.63 MG/3ML nebulizer solution, One vial in nebulizer four times daily as needed dx 493.00, Disp: 360 mL, Rfl: 4 .  losartan (COZAAR) 50 MG tablet, take 1 tablet by mouth  once daily, Disp: 90 tablet, Rfl: 3 .  metoprolol (LOPRESSOR) 50 MG tablet, Take 1.5 tablets (75 mg total) by mouth 2 (two) times daily., Disp: 270 tablet, Rfl: 3 .  NEXIUM 40 MG capsule, take 1 capsule by mouth once daily, Disp: 30 capsule, Rfl: 5 .  polyethylene glycol powder (GLYCOLAX/MIRALAX) powder, take 17GM (DISSOLVED IN WATER) by mouth once daily PRN, takes three times a week, Disp: 255 g, Rfl: 5 .  RA LORATADINE 10 MG tablet, take 1 tablet by mouth once daily, Disp: 30 tablet, Rfl: 5 .  spironolactone (ALDACTONE) 25 MG tablet, Take 1 tablet (25 mg total) by mouth daily., Disp: 90 tablet, Rfl: 3 .  torsemide (DEMADEX) 10 MG tablet, Take 2 tablets by mouth once daily, may take additional 2 tablets as directed, Disp: 80 tablet, Rfl: 5 .  warfarin (COUMADIN) 5 MG tablet, take 1 and 1/2 by mouth once daily, Disp: 45 tablet, Rfl: 5     Allergies  Allergen Reactions  . Avelox [Moxifloxacin Hcl In Nacl] Other (See Comments)  Cardiac arrest per pt  . Simvastatin Other (See Comments)    myalgias  . Ace Inhibitors Cough    Dry cough   . Latex Rash    Social History  Substance Use Topics  . Smoking status: Never Smoker  . Smokeless tobacco: Never Used  . Alcohol use No    Family History  Problem Relation Age of Onset  . Diabetes Father   . Hypertension Father   . Lung disease Neg Hx   . Cancer Neg Hx   . Rheumatologic disease Neg Hx      Review of Systems  Constitutional: Negative.   HENT: Negative.   Eyes: Negative.   Respiratory: Negative.   Cardiovascular: Negative.   Gastrointestinal: Negative.   Genitourinary: Negative.   Musculoskeletal: Positive for back pain and joint pain.  Skin: Negative.   Neurological: Negative.   Endo/Heme/Allergies: Negative.   Psychiatric/Behavioral: Negative.     Objective:  Physical Exam  Constitutional: She is oriented to person, place, and time. She appears well-developed and well-nourished.  HENT:  Head: Normocephalic and  atraumatic.  Eyes: Conjunctivae and EOM are normal. Pupils are equal, round, and reactive to light.  Neck: Neck supple.  Cardiovascular: Normal rate and regular rhythm.   Respiratory: Effort normal and breath sounds normal.  GI: Bowel sounds are normal.  Musculoskeletal:  Examination of her right knee reveals 2+ synovitis, 1+ crepitation, full range of motion, knee is stable with normal patellar tracking.  Examination of the left knee reveals well-healed total knee replacement without swelling or pain. Full range of motion.   Knee is stable With normal patellar tracking.  Vascular exam: Pulses 2+ and symmetric.    Neurological: She is alert and oriented to person, place, and time.  Skin: Skin is warm and dry.  Psychiatric: She has a normal mood and affect. Her behavior is normal.    Vital signs in last 24 hours: Temp:  [98.2 F (36.8 C)] 98.2 F (36.8 C) (07/25 1400) Pulse Rate:  [88] 88 (07/25 1400) BP: (110)/(78) 110/78 (07/25 1400) SpO2:  [98 %] 98 % (07/25 1400) Weight:  [93.9 kg (207 lb)] 93.9 kg (207 lb) (07/25 1400)  Labs:   Estimated body mass index is 32.42 kg/m as calculated from the following:   Height as of this encounter: 5\' 7"  (1.702 m).   Weight as of this encounter: 93.9 kg (207 lb).   Imaging Review Plain radiographs demonstrate severe degenerative joint disease of the right knee(s). The overall alignment ismild varus. The bone quality appears to be good for age and reported activity level.  Assessment/Plan:  End stage arthritis, right knee  Principal Problem:   Primary localized osteoarthritis of right knee Active Problems:   Type 2 diabetes, uncontrolled, with neuropathy (HCC)   HYPERCHOLESTEROLEMIA   HYPERTENSION, BENIGN SYSTEMIC   Atrial fibrillation (HCC)   DISEASE, VOCAL CORD NEC   Sleep apnea   Chronic systolic heart failure (HCC)   Diabetic neuropathy (HCC)   Asthma, moderate persistent   CKD (chronic kidney disease) stage 2, GFR 60-89  ml/min   ICD (St. Jude Protecta dual-chamber),secondary prevention (VF arrest) January 2012   Ventricular tachycardia (HCC)   Long term (current) use of anticoagulants   The patient history, physical examination, clinical judgment of the provider and imaging studies are consistent with end stage degenerative joint disease of the right knee(s) and total knee arthroplasty is deemed medically necessary. The treatment options including medical management, injection therapy arthroscopy and arthroplasty were discussed at  length. The risks and benefits of total knee arthroplasty were presented and reviewed. The risks due to aseptic loosening, infection, stiffness, patella tracking problems, thromboembolic complications and other imponderables were discussed. The patient acknowledged the explanation, agreed to proceed with the plan and consent was signed. Patient is being admitted for inpatient treatment for surgery, pain control, PT, OT, prophylactic antibiotics, VTE prophylaxis, progressive ambulation and ADL's and discharge planning. The patient is planning to be discharged home with home health services

## 2015-11-07 ENCOUNTER — Ambulatory Visit (INDEPENDENT_AMBULATORY_CARE_PROVIDER_SITE_OTHER): Payer: Medicare Other | Admitting: Pharmacist Clinician (PhC)/ Clinical Pharmacy Specialist

## 2015-11-07 DIAGNOSIS — I4891 Unspecified atrial fibrillation: Secondary | ICD-10-CM | POA: Diagnosis not present

## 2015-11-07 DIAGNOSIS — Z7901 Long term (current) use of anticoagulants: Secondary | ICD-10-CM | POA: Diagnosis not present

## 2015-11-07 LAB — POCT INR: INR: 2.2

## 2015-11-08 ENCOUNTER — Encounter (HOSPITAL_COMMUNITY): Payer: Self-pay

## 2015-11-08 ENCOUNTER — Encounter (HOSPITAL_COMMUNITY)
Admission: RE | Admit: 2015-11-08 | Discharge: 2015-11-08 | Disposition: A | Discharge status: Home / Self Care | Payer: Medicare Other | Source: Ambulatory Visit | Attending: Orthopedic Surgery | Admitting: Orthopedic Surgery

## 2015-11-08 DIAGNOSIS — Z7901 Long term (current) use of anticoagulants: Secondary | ICD-10-CM | POA: Insufficient documentation

## 2015-11-08 DIAGNOSIS — G4730 Sleep apnea, unspecified: Secondary | ICD-10-CM | POA: Diagnosis not present

## 2015-11-08 DIAGNOSIS — I130 Hypertensive heart and chronic kidney disease with heart failure and stage 1 through stage 4 chronic kidney disease, or unspecified chronic kidney disease: Secondary | ICD-10-CM | POA: Insufficient documentation

## 2015-11-08 DIAGNOSIS — E1140 Type 2 diabetes mellitus with diabetic neuropathy, unspecified: Secondary | ICD-10-CM | POA: Insufficient documentation

## 2015-11-08 DIAGNOSIS — I5022 Chronic systolic (congestive) heart failure: Secondary | ICD-10-CM | POA: Insufficient documentation

## 2015-11-08 DIAGNOSIS — Z79899 Other long term (current) drug therapy: Secondary | ICD-10-CM | POA: Insufficient documentation

## 2015-11-08 DIAGNOSIS — E7800 Pure hypercholesterolemia, unspecified: Secondary | ICD-10-CM | POA: Diagnosis not present

## 2015-11-08 DIAGNOSIS — E1122 Type 2 diabetes mellitus with diabetic chronic kidney disease: Secondary | ICD-10-CM | POA: Diagnosis not present

## 2015-11-08 DIAGNOSIS — Z0183 Encounter for blood typing: Secondary | ICD-10-CM | POA: Diagnosis not present

## 2015-11-08 DIAGNOSIS — M1711 Unilateral primary osteoarthritis, right knee: Secondary | ICD-10-CM | POA: Diagnosis not present

## 2015-11-08 DIAGNOSIS — N182 Chronic kidney disease, stage 2 (mild): Secondary | ICD-10-CM | POA: Diagnosis not present

## 2015-11-08 DIAGNOSIS — Z95810 Presence of automatic (implantable) cardiac defibrillator: Secondary | ICD-10-CM | POA: Diagnosis not present

## 2015-11-08 DIAGNOSIS — J4540 Moderate persistent asthma, uncomplicated: Secondary | ICD-10-CM | POA: Insufficient documentation

## 2015-11-08 DIAGNOSIS — Z01812 Encounter for preprocedural laboratory examination: Secondary | ICD-10-CM | POA: Diagnosis not present

## 2015-11-08 DIAGNOSIS — I4891 Unspecified atrial fibrillation: Secondary | ICD-10-CM | POA: Diagnosis not present

## 2015-11-08 HISTORY — DX: Presence of spectacles and contact lenses: Z97.3

## 2015-11-08 LAB — COMPREHENSIVE METABOLIC PANEL
ALK PHOS: 92 U/L (ref 38–126)
ALT: 34 U/L (ref 14–54)
AST: 22 U/L (ref 15–41)
Albumin: 3.6 g/dL (ref 3.5–5.0)
Anion gap: 8 (ref 5–15)
BUN: 15 mg/dL (ref 6–20)
CALCIUM: 9.4 mg/dL (ref 8.9–10.3)
CHLORIDE: 101 mmol/L (ref 101–111)
CO2: 26 mmol/L (ref 22–32)
CREATININE: 1.06 mg/dL — AB (ref 0.44–1.00)
GFR calc Af Amer: >60 mL/min (ref >60)
GFR, EST NON AFRICAN AMERICAN: 54 mL/min — AB (ref >60)
Glucose, Bld: 216 mg/dL — ABNORMAL HIGH (ref 65–99)
Potassium: 4 mmol/L (ref 3.5–5.1)
Sodium: 135 mmol/L (ref 135–145)
Total Bilirubin: 0.7 mg/dL (ref 0.3–1.2)
Total Protein: 7.5 g/dL (ref 6.5–8.1)

## 2015-11-08 LAB — CBC WITH DIFFERENTIAL/PLATELET
BASOS ABS: 0 10*3/uL (ref 0.0–0.1)
Basophils Relative: 0 %
EOS PCT: 4 %
Eosinophils Absolute: 0.2 10*3/uL (ref 0.0–0.7)
HEMATOCRIT: 39.2 % (ref 36.0–46.0)
HEMOGLOBIN: 12.6 g/dL (ref 12.0–15.0)
LYMPHS PCT: 49 %
Lymphs Abs: 3 10*3/uL (ref 0.7–4.0)
MCH: 28.8 pg (ref 26.0–34.0)
MCHC: 32.1 g/dL (ref 30.0–36.0)
MCV: 89.5 fL (ref 78.0–100.0)
Monocytes Absolute: 0.4 10*3/uL (ref 0.1–1.0)
Monocytes Relative: 6 %
NEUTROS ABS: 2.5 10*3/uL (ref 1.7–7.7)
NEUTROS PCT: 41 %
PLATELETS: 227 10*3/uL (ref 150–400)
RBC: 4.38 MIL/uL (ref 3.87–5.11)
RDW: 13.3 % (ref 11.5–15.5)
WBC: 6.1 10*3/uL (ref 4.0–10.5)

## 2015-11-08 LAB — SURGICAL PCR SCREEN
MRSA, PCR: NEGATIVE
STAPHYLOCOCCUS AUREUS: POSITIVE — AB

## 2015-11-08 LAB — TYPE AND SCREEN
ABO/RH(D): B POS
Antibody Screen: NEGATIVE

## 2015-11-08 LAB — GLUCOSE, CAPILLARY: Glucose-Capillary: 248 mg/dL — ABNORMAL HIGH (ref 65–99)

## 2015-11-08 LAB — APTT: APTT: 35 s (ref 24–36)

## 2015-11-08 NOTE — Pre-Procedure Instructions (Signed)
[name]  11/08/2015      [id number] - [place], [place] - [address] [place] [address] Phone: [phone] Fax: [phone]  [facility] - [place], [place] - [address] [place] [address] Phone: [phone] Fax: [phone]  [facility] - [place], [place] - [address] [facility] [address] [place] [address] Phone: [phone] Fax: [phone]  [facility] [address] [place], [place] - [address] [facility] [address] [place] [address] Phone: [phone] Fax: [phone]    Your procedure is scheduled on Monday, November 18, 2015  Report to [facility] Admitting at 5:30 A.M.  Call this number if you have problems the morning of surgery:  [phone]   Remember: Last dose of Warfarin (Coumadin) isTuesday, November 12, 2015 ( per MD)   Do not eat food or drink liquids after midnight Sunday, November 17, 2015  Take these medicines the morning of surgery with A SIP OF WATER : Bupropion ( Wellbutrin), Dofetilide (Tikosyn), Metoprolol ( Lopressor), Nexium, Loratadine, Pulmicort inhaler, Flonase nasal spray,  if needed: Xopenex nebulizer, Xopenex inhaler ( bring inhaler in with you on day of surgery)  Stop taking Aspirin, Coumadin, vitamins, fish oil and herbal medications. Do not take any NSAIDs ie: Ibuprofen, Advil, Naproxen, BC and Goody Powder or any medication containing Aspirin; stop Tuesday, November 12, 2015.    How to Manage Your Diabetes Before and After Surgery  Why is it important to control my blood sugar before and after surgery? . Improving blood sugar levels before and after surgery helps healing and can limit problems. . A way of improving blood sugar control is eating a healthy diet by: o  Eating less sugar and carbohydrates o  Increasing activity/exercise o  Talking with your  doctor about reaching your blood sugar goals . High blood sugars (greater than 180 mg/dL) can raise your risk of infections and slow your recovery, so you will need to focus on controlling your diabetes during the weeks before surgery. . Make sure that the doctor who takes care of your diabetes knows about your planned surgery including the date and location.  How do I manage my blood sugar before surgery? . Check your blood sugar at least 4 times a day, starting 2 days before surgery, to make sure that the level is not too high or low. o Check your blood sugar the morning of your surgery when you wake up and every 2 hours until you get to the Short Stay unit. . If your blood sugar is less than 70 mg/dL, you will need to treat for low blood sugar: o Do not take insulin. o Treat a low blood sugar (less than 70 mg/dL) with  cup of clear juice (cranberry or apple), 4 glucose tablets, OR glucose gel. o Recheck blood sugar in 15 minutes after treatment (to make sure it is greater than 70 mg/dL). If your blood sugar is not greater than 70 mg/dL on recheck, call [phone] for further instructions. . Report your blood sugar to the short stay nurse when you get to Short Stay.  . If you are admitted to the hospital after surgery: o Your blood sugar will be checked by the staff and you will probably be given insulin after surgery (instead of oral diabetes medicines) to make sure you have good blood sugar levels. o The  goal for blood sugar control after surgery is 80-180 mg/dL.  WHAT DO I DO ABOUT MY DIABETES MEDICATION?  Marland Kitchen Do not  take oral diabetes medicines (pills) the morning of surgery.  . THE MORNING OF SURGERY, take 25 units of  Glargine (Lantus) Insulin  . If your CBG is greater than 220 mg/dL, you may take  of your sliding scale (correction) dose of insulin.   Patient Signature:  Date:   Nurse Signature:  Date:   Reviewed and Endorsed by [facility] Patient Education Committee, August  2015  Do not wear jewelry, make-up or nail polish.  Do not wear lotions, powders, or perfumes.  You may not wear deoderant.  Do not shave 48 hours prior to surgery.   Do not bring valuables to the hospital.  [facility] is not responsible for any belongings or valuables.  Contacts, dentures or bridgework may not be worn into surgery.  Leave your suitcase in the car.  After surgery it may be brought to your room.  For patients admitted to the hospital, discharge time will be determined by your treatment team.  Patients discharged the day of surgery will not be allowed to drive home.   Name and phone number of your driver:   Special instructions: Shower the night before surgery and the morning of surgery with CHG.  Please read over the following fact sheets that you were given. Pain Booklet, Coughing and Deep Breathing, Blood Transfusion Information, MRSA Information and Surgical Site Infection Prevention

## 2015-11-08 NOTE — Progress Notes (Signed)
Pt denies SOB and chest pain but is under the care of Dr. Royann Shivers, Cardiology. Pt stated that her doctor changed her Lantus to 50 units in the AM instead of HS. Pt stated that her fast blood glucose usuall y ranges between 119-200. Pt stated that she was instructed to take her last dose of Coumadin on Tuesday, August 1st. Pt chart forwarded to anesthesia for review of cardiac clearance.

## 2015-11-09 LAB — HEMOGLOBIN A1C
HEMOGLOBIN A1C: 9.4 % — AB (ref 4.8–5.6)
MEAN PLASMA GLUCOSE: 223 mg/dL

## 2015-11-10 LAB — URINE CULTURE: Culture: >=100000 — AB

## 2015-11-12 ENCOUNTER — Ambulatory Visit (INDEPENDENT_AMBULATORY_CARE_PROVIDER_SITE_OTHER): Payer: Medicare Other | Admitting: Podiatry

## 2015-11-12 ENCOUNTER — Telehealth: Payer: Self-pay | Admitting: Pharmacist

## 2015-11-12 ENCOUNTER — Encounter: Payer: Self-pay | Admitting: Podiatry

## 2015-11-12 DIAGNOSIS — M79676 Pain in unspecified toe(s): Secondary | ICD-10-CM

## 2015-11-12 DIAGNOSIS — L84 Corns and callosities: Secondary | ICD-10-CM | POA: Diagnosis not present

## 2015-11-12 DIAGNOSIS — E1140 Type 2 diabetes mellitus with diabetic neuropathy, unspecified: Secondary | ICD-10-CM

## 2015-11-12 DIAGNOSIS — B351 Tinea unguium: Secondary | ICD-10-CM

## 2015-11-12 DIAGNOSIS — M79609 Pain in unspecified limb: Principal | ICD-10-CM

## 2015-11-12 DIAGNOSIS — Z794 Long term (current) use of insulin: Secondary | ICD-10-CM

## 2015-11-12 NOTE — Progress Notes (Signed)
Subjective:     Patient ID: [name], female   DOB: 02/07/1951, 65 y.o.   MRN: [id number]  HPIThis patient presents to the office for preventive foot care services.  She says her nails have grown thick and long since her last visit.  She has pain walking and wearing her shoes.  She also states her callus under both feet are painful especially under the right forefoot.She is diabetic with neuropathy.   Review of Systems     Objective:   Physical Exam GENERAL APPEARANCE: Alert, conversant. Appropriately groomed. No acute distress.  VASCULAR: Pedal pulses palpable at  DP and PT bilateral.  Capillary refill time is immediate to all digits,  Normal temperature gradient.  Digital hair growth is present bilateral  NEUROLOGIC: sensation is normal to 5.07 monofilament at 5/5 sites bilateral.  Light touch is intact bilateral, Muscle strength normal.  MUSCULOSKELETAL: acceptable muscle strength, tone and stability bilateral.  Intrinsic muscluature intact bilateral.  Rectus appearance of foot and digits noted bilateral.   DERMATOLOGIC: skin color, texture, and turgor are within normal limits.  No preulcerative lesions or ulcers  are seen, no interdigital maceration noted.  No open lesions present.  . No drainage noted. Callus sub 2,3 right foot.  Pinch callus right foot.  Distal clavi 2 right.   Callus sub4th left foot.  NAILS  Thick disfigured discolored nails both feet.      Assessment:     Onychomycosis  Callus B/L     Plan:     ROV  Debridement and grinding of long nails.  Debridement of plantar tyloma.  RTC 3 months   Cyncere Sontag DPM       

## 2015-11-12 NOTE — Telephone Encounter (Signed)
Patient called - she was scheduled to have a procedure on 11/18/15 but this has been cancelled due to her sugars being elevated. The procedure will postponed until Dec.   Have instructed her to remain on her current dose of warfarin as her last INR was thereapeutic. We will see her back in office 4 weeks from her last INR check as she will not have home health since she did not have procedure. Appt made.   Pt states she understands and appreciates help.

## 2015-11-14 ENCOUNTER — Ambulatory Visit: Payer: Medicare Other | Admitting: Family Medicine

## 2015-11-18 ENCOUNTER — Inpatient Hospital Stay (HOSPITAL_COMMUNITY): Admission: RE | Admit: 2015-11-18 | Payer: Medicare Other | Source: Ambulatory Visit | Admitting: Orthopedic Surgery

## 2015-11-18 ENCOUNTER — Encounter (HOSPITAL_COMMUNITY): Admission: RE | Payer: Self-pay | Source: Ambulatory Visit

## 2015-11-18 HISTORY — DX: Unilateral primary osteoarthritis, right knee: M17.11

## 2015-11-18 SURGERY — ARTHROPLASTY, KNEE, TOTAL
Anesthesia: General | Site: Knee | Laterality: Right

## 2015-11-20 ENCOUNTER — Encounter: Payer: Medicare Other | Admitting: Nutrition

## 2015-11-25 ENCOUNTER — Ambulatory Visit (INDEPENDENT_AMBULATORY_CARE_PROVIDER_SITE_OTHER): Payer: Medicare Other | Admitting: *Deleted

## 2015-11-25 DIAGNOSIS — Z95810 Presence of automatic (implantable) cardiac defibrillator: Secondary | ICD-10-CM

## 2015-11-25 DIAGNOSIS — I5022 Chronic systolic (congestive) heart failure: Secondary | ICD-10-CM | POA: Diagnosis not present

## 2015-11-25 DIAGNOSIS — I4581 Long QT syndrome: Secondary | ICD-10-CM

## 2015-11-25 NOTE — Progress Notes (Signed)
Remote ICD transmission.   

## 2015-11-25 NOTE — Progress Notes (Signed)
EPIC Encounter for ICM Monitoring  Patient Name: [name] is a 65 y.o. female Date: 11/25/2015 Primary Care Physican: Garry Heater, DO Primary Cardiologist: Croitoru Electrophysiologist: Croitoru Dry Weight: unknown      Heart Failure questions reviewed, pt asymptomatic   Thoracic impedance abnormal suggesting fluid accumulation since 11/18/2015.  Recommendations: Increase Furosemide 10 mg 2 tablets to bid x 3 days and then return to prescribed dosage of 10 mg 2 tablets daily.  Reminded her to follow low sodium diet.    Follow-up plan: ICM clinic phone appointment on 12/03/2015.  Copy of ICM check sent to primary cardiologist and device physician.   ICM trend: 11/25/2015       [name], RN 11/25/2015 11:43 AM

## 2015-11-25 NOTE — Progress Notes (Signed)
Sounds good! I think I follow, but I am getting old.Marland KitchenMarland Kitchen

## 2015-11-27 ENCOUNTER — Encounter: Payer: Medicare Other | Attending: Family Medicine | Admitting: Nutrition

## 2015-11-27 ENCOUNTER — Encounter: Payer: Self-pay | Admitting: Cardiology

## 2015-11-27 DIAGNOSIS — Z794 Long term (current) use of insulin: Secondary | ICD-10-CM | POA: Diagnosis not present

## 2015-11-27 DIAGNOSIS — E1165 Type 2 diabetes mellitus with hyperglycemia: Secondary | ICD-10-CM

## 2015-11-27 DIAGNOSIS — E1140 Type 2 diabetes mellitus with diabetic neuropathy, unspecified: Secondary | ICD-10-CM

## 2015-11-27 DIAGNOSIS — IMO0002 Reserved for concepts with insufficient information to code with codable children: Secondary | ICD-10-CM

## 2015-11-27 DIAGNOSIS — E119 Type 2 diabetes mellitus without complications: Secondary | ICD-10-CM | POA: Diagnosis not present

## 2015-11-27 NOTE — Patient Instructions (Signed)
Call Doctor's office if you experience any more unexplained low blood sugars, or more that twice in one week, at the same time of day. Make sure there is protein at each meal. Test blood sugars before meals and at bedtime the week before seeing the doctor.   Call if questions about diet.

## 2015-11-27 NOTE — Assessment & Plan Note (Signed)
Weight is unchanged.   Pt. Is tesing twice a day.  FBSs last week:  135, 87, 99, 125, 220, 107,                                              AcS:  173, 161, 168,                                             2hr. PcL: 168 One low blood sugars: 61 acS  She treated this appropriately.  No protein with lunch that day.  She was encouraged to have protein when eating a vegetable plate-- suggestions given for this. Diet history reviewed and most of her meals are balanced with protein, except for 3 --one was as above, and the other 2 were breakfast with oatmeal and fruit.  Suggested 1/4 cup of nuts or 2T of peanut butter (which she loves).  She reported good understanding of this, and I believe that she is motivated to make this needed change.   She says she is doing the regular gingerale (4 ounce can now), only twice since her last visit.  She was given much praise for this.  She had no final questions.

## 2015-11-27 NOTE — Patient Instructions (Signed)
1.  reduced the Novolog from 5u before lunch, 5u acS.   2.  Test blood sugars when feeling weak and shakey. 3.  Test blood sugars before meals and at bedtime. 4.  Discussedt the  with her that she needs to limit the Gingerale to a small can (4 ounces) and have it only twice a week  5.  Praised her for her diet changes. 6.  Add protein to breakfast when having oatmeal.  Gave sugestions for this. 7.  Do not do between meals snacks unless feeling hungry or having a low blood sugar. 8.  Limit fruit to 1/2 banana, or 1 apple, or 1 orange, 1 cup of watermelon, at one time,  Twice a day.      ** Call doctor's office if still having low blood sugars (below 70mg ).

## 2015-11-28 ENCOUNTER — Other Ambulatory Visit: Payer: Self-pay | Admitting: Pulmonary Disease

## 2015-12-03 ENCOUNTER — Ambulatory Visit (INDEPENDENT_AMBULATORY_CARE_PROVIDER_SITE_OTHER): Payer: Medicare Other

## 2015-12-03 ENCOUNTER — Telehealth: Payer: Self-pay | Admitting: Cardiology

## 2015-12-03 DIAGNOSIS — I5022 Chronic systolic (congestive) heart failure: Secondary | ICD-10-CM

## 2015-12-03 DIAGNOSIS — Z95810 Presence of automatic (implantable) cardiac defibrillator: Secondary | ICD-10-CM

## 2015-12-03 NOTE — Telephone Encounter (Signed)
Spoke with pt and reminded pt of remote transmission that is due today. Pt verbalized understanding.   

## 2015-12-03 NOTE — Progress Notes (Signed)
EPIC Encounter for ICM Monitoring  Patient Name: [name] is a 65 y.o. female Date: 12/03/2015 Primary Care Physican: Garry Heater, DO Primary Cardiologist: Croitoru Electrophysiologist: Croitoru Dry Weight: 203 lb       Heart Failure questions reviewed, pt has been having fluctuations of weight gain ranging from 5-6 lb gain.  Has been as high as 208 lbs in the last few weeks.  Thoracic impedance abnormal suggesting fluid accumulation 11/12/2015 to 11/26/2015, 11/28/2015 to 12/03/2015 but trending to baseline on 12/03/2015.  Recommendations:  She has been taking extra Torsemide 10 mg 2 tablets when needed and will take Torsemide 10 mg 2 tablets bid x 2 days and will return to prescribed dosage of Torsemide 10 mg 2 tablets every am after 2nd day.   She thinks she may be drinking more fluids than 64 oz daily and eating over 2000 mg day.  Advised to limit salt to 2000 mg and 64 oz fluid daily.    Follow-up plan: ICM clinic phone appointment on 01/03/2016.  Copy of ICM check sent to device physician.   ICM trend: 12/03/2015       [name], RN 12/03/2015 1:47 PM

## 2015-12-04 NOTE — Progress Notes (Signed)
Thanks

## 2015-12-05 ENCOUNTER — Ambulatory Visit (INDEPENDENT_AMBULATORY_CARE_PROVIDER_SITE_OTHER): Payer: Medicare Other | Admitting: Pharmacist Clinician (PhC)/ Clinical Pharmacy Specialist

## 2015-12-05 DIAGNOSIS — Z7901 Long term (current) use of anticoagulants: Secondary | ICD-10-CM | POA: Diagnosis not present

## 2015-12-05 DIAGNOSIS — I4891 Unspecified atrial fibrillation: Secondary | ICD-10-CM

## 2015-12-05 LAB — POCT INR: INR: 2.1

## 2015-12-06 ENCOUNTER — Encounter: Payer: Self-pay | Admitting: Family Medicine

## 2015-12-06 ENCOUNTER — Ambulatory Visit (INDEPENDENT_AMBULATORY_CARE_PROVIDER_SITE_OTHER): Payer: Medicare Other | Admitting: Family Medicine

## 2015-12-06 DIAGNOSIS — E1165 Type 2 diabetes mellitus with hyperglycemia: Secondary | ICD-10-CM | POA: Diagnosis not present

## 2015-12-06 DIAGNOSIS — E1140 Type 2 diabetes mellitus with diabetic neuropathy, unspecified: Secondary | ICD-10-CM

## 2015-12-06 DIAGNOSIS — Z23 Encounter for immunization: Secondary | ICD-10-CM

## 2015-12-06 DIAGNOSIS — IMO0002 Reserved for concepts with insufficient information to code with codable children: Secondary | ICD-10-CM

## 2015-12-06 NOTE — Progress Notes (Signed)
Subjective:     Patient ID: [name], female   DOB: March 27, 1951, 65 y.o.   MRN: [id number]  HPI [name] is a 65 year old female presenting today for diabetes follow-up. -Last seen by me in May 2017. A1c 10 at that time. Lantus 50 units move to morning dosing. NovoLog increased to 7 units with supper. -A1c repeated in July 2017. Improved to 9.4. -Uses Lantus 50units in the morning. Uses Novolog 5mg  with lunch. -Interested in trying sliding scale insulin. Also amenable to increasing Lantus dose which she is usually not interested in. - Brought blood sugar log. Elevated blood sugars noted at night and diffusely elevated throughout the day.  - Follows with Podiatry and Ophthalmology -Currently following a nutrition. -Had to cancel knee surgery. Orthopedics prefers A1c less than 8. -Nonsmoker  Review of Systems Per HPI. Other systems negative.    Objective:   Physical Exam  Constitutional: She appears well-developed and well-nourished. No distress.  Cardiovascular: Normal rate and regular rhythm.   No murmur heard. Pedal pulses palpable bilaterally  Pulmonary/Chest: No respiratory distress. She has no wheezes.  Musculoskeletal: She exhibits no edema.  Neurological:  Decreased sensation over feet bilaterally  Skin:  Callus formation noted over feet bilaterally  Psychiatric: She has a normal mood and affect. Her behavior is normal.      Assessment and Plan:     Type 2 diabetes, uncontrolled, with neuropathy (HCC) -A1c 9.4 in July 2017. -Increase Lantus to 55 units with breakfast. This is a big improvement for [name], who is usually disagreeable to changing Lantus. Anticipate we can now start making improvements in A1c. -Sliding-scale insulin given for lunch and supper: BG 70-130: 0 units BG 131-190: 4 units BG 181-240: 8units BG 241-300: 10units BG 301-350: 12units BG 351-400: 16units -Continue to follow with podiatry, ophthalmology, and nutrition -Follow-up in 2  months for repeat A1c

## 2015-12-06 NOTE — Patient Instructions (Signed)
Thank you so much for coming to visit today! Your A1c was elevated in July to 9.4. Please increase your Lantus to 55 units in the morning. You have decided to do a sliding scale at both lunch and suppertime. The sliding scale as detailed below: BG 70-130: 0 units BG 131-190: 4 units BG 181-240: 8units BG 241-300: 10units BG 301-350: 12units BG 351-400: 16units  Please follow-up in 2 months or sooner if needed to check your A1c. Dr. Caroleen Hamman

## 2015-12-06 NOTE — Assessment & Plan Note (Signed)
-  A1c 9.4 in July 2017. -Increase Lantus to 55 units with breakfast. This is a big improvement for Mrs. Ferrario, who is usually disagreeable to changing Lantus. Anticipate we can now start making improvements in A1c. -Sliding-scale insulin given for lunch and supper: BG 70-130: 0 units BG 131-190: 4 units BG 181-240: 8units BG 241-300: 10units BG 301-350: 12units BG 351-400: 16units -Continue to follow with podiatry, ophthalmology, and nutrition -Follow-up in 2 months for repeat A1c

## 2015-12-10 LAB — CUP PACEART REMOTE DEVICE CHECK
Battery Voltage: 2.86 V
Brady Statistic AP VP Percent: 0.27 %
Brady Statistic AS VS Percent: 3.18 %
Date Time Interrogation Session: 20170814073529
HighPow Impedance: 342 Ohm
HighPow Impedance: 68 Ohm
Implantable Lead Implant Date: 20120120
Implantable Lead Location: 753859
Implantable Lead Model: 6935
Lead Channel Impedance Value: 418 Ohm
Lead Channel Pacing Threshold Amplitude: 0.375 V
Lead Channel Pacing Threshold Pulse Width: 0.4 ms
Lead Channel Sensing Intrinsic Amplitude: 1.5 mV
Lead Channel Sensing Intrinsic Amplitude: 1.5 mV
Lead Channel Setting Pacing Amplitude: 2.5 V
Lead Channel Setting Sensing Sensitivity: 0.3 mV
MDC IDC LEAD IMPLANT DT: 20120120
MDC IDC LEAD LOCATION: 753860
MDC IDC MSMT LEADCHNL RA PACING THRESHOLD PULSEWIDTH: 0.4 ms
MDC IDC MSMT LEADCHNL RV IMPEDANCE VALUE: 418 Ohm
MDC IDC MSMT LEADCHNL RV PACING THRESHOLD AMPLITUDE: 0.625 V
MDC IDC MSMT LEADCHNL RV SENSING INTR AMPL: 12.125 mV
MDC IDC MSMT LEADCHNL RV SENSING INTR AMPL: 12.125 mV
MDC IDC SET LEADCHNL RA PACING AMPLITUDE: 2 V
MDC IDC SET LEADCHNL RV PACING PULSEWIDTH: 0.4 ms
MDC IDC STAT BRADY AP VS PERCENT: 96.53 %
MDC IDC STAT BRADY AS VP PERCENT: 0.02 %
MDC IDC STAT BRADY RA PERCENT PACED: 96.79 %
MDC IDC STAT BRADY RV PERCENT PACED: 0.29 %

## 2015-12-11 ENCOUNTER — Encounter: Payer: Self-pay | Admitting: Cardiology

## 2015-12-26 ENCOUNTER — Encounter: Payer: Self-pay | Admitting: Pulmonary Disease

## 2015-12-26 ENCOUNTER — Ambulatory Visit (INDEPENDENT_AMBULATORY_CARE_PROVIDER_SITE_OTHER): Payer: Medicare Other | Admitting: Pulmonary Disease

## 2015-12-26 VITALS — BP 98/70 | HR 84 | Ht 66.0 in | Wt 209.4 lb

## 2015-12-26 DIAGNOSIS — J4541 Moderate persistent asthma with (acute) exacerbation: Secondary | ICD-10-CM | POA: Diagnosis not present

## 2015-12-26 DIAGNOSIS — J4540 Moderate persistent asthma, uncomplicated: Secondary | ICD-10-CM

## 2015-12-26 LAB — NITRIC OXIDE: NITRIC OXIDE: 47

## 2015-12-26 MED ORDER — PREDNISONE 10 MG PO TABS: ORAL_TABLET | ORAL | 0 refills | Status: DC

## 2015-12-26 MED ORDER — HYDROCODONE-HOMATROPINE 5-1.5 MG/5ML PO SYRP: 5.0000 mL | ORAL_SOLUTION | Freq: Four times a day (QID) | ORAL | 0 refills | Status: DC | PRN

## 2015-12-26 NOTE — Addendum Note (Signed)
Addended by: Maisie Fus on: 12/26/2015 10:06 AM   Modules accepted: Orders

## 2015-12-26 NOTE — Progress Notes (Signed)
Current Outpatient Prescriptions on File Prior to Visit  Medication Sig  . B-D ULTRAFINE III SHORT PEN 31G X 8 MM MISC use as directed  . budesonide (PULMICORT FLEXHALER) 180 MCG/ACT inhaler Inhale 2 puffs into the lungs 2 (two) times daily.  Marland Kitchen buPROPion (WELLBUTRIN XL) 150 MG 24 hr tablet take 1 tablet by mouth once daily  . chlorpheniramine (CHLOR-TRIMETON) 4 MG tablet 8mg  at night  . CRESTOR 10 MG tablet take 1 tablet by mouth once daily  . dofetilide (TIKOSYN) 250 MCG capsule Take 250 mcg by mouth 2 (two) times daily. Reported on 09/26/2015  . fluticasone (FLONASE) 50 MCG/ACT nasal spray instill 1 spray into each nostril twice a day  . insulin aspart (NOVOLOG FLEXPEN) 100 UNIT/ML FlexPen Please inject 10units prior to lunch and 7units prior to supper.  . Insulin Glargine (LANTUS) 100 UNIT/ML Solostar Pen Inject 50 Units into the skin at bedtime. (Patient taking differently: Inject 50 Units into the skin every morning. )  . Insulin Pen Needle (B-D ULTRAFINE III SHORT PEN) 31G X 8 MM MISC Use as directed  . levalbuterol (XOPENEX) 0.63 MG/3ML nebulizer solution One vial in nebulizer four times daily as needed dx 493.00  . losartan (COZAAR) 50 MG tablet take 1 tablet by mouth once daily  . metoprolol (LOPRESSOR) 50 MG tablet Take 1.5 tablets (75 mg total) by mouth 2 (two) times daily.  Marland Kitchen NEXIUM 40 MG capsule take 1 capsule by mouth once daily  . polyethylene glycol powder (GLYCOLAX/MIRALAX) powder take 17GM (DISSOLVED IN WATER) by mouth once daily PRN, takes three times a week  . RA LORATADINE 10 MG tablet take 1 tablet by mouth once daily  . spironolactone (ALDACTONE) 25 MG tablet Take 1 tablet (25 mg total) by mouth daily.  Marland Kitchen torsemide (DEMADEX) 10 MG tablet Take 2 tablets by mouth once daily, may take additional 2 tablets as directed  . warfarin (COUMADIN) 5 MG tablet take 1 and 1/2 by mouth once daily  . XOPENEX HFA 45 MCG/ACT inhaler INHALE 1 TO 2 PUFFS BY MOUTH INTO THE LUNGS EVERY 4 HOURS  IF NEEDED FOR WHEEZING   No current facility-administered medications on file prior to visit.      Chief Complaint  Patient presents with  . Acute Visit    Pt c/o cough, wheezing and congestion with pale green mucus x 2 days. Denies fever.  Xopenex does not seem to open her up enough.      Tests 11/04/15: FVC 2.95 L (102%) FEV1 2.10 L (93%) FEV1/FVC 0.71 FEF 25-75 1.37 L (64%) no bronchodilator response TLC 6.02 L (109%) RV 109% ERV 47% DLCO uncorrected 75% (hgb 12.7)  Past medical hx Systolic CHF, A fib, s/p AICD, HTN, HLD, Depression, DM, GERD, HH, OSA.  Past surgical hx, Allergies, Family hx, Social hx all reviewed.  Vital Signs BP 98/70 (BP Location: Left Arm, Cuff Size: Normal)   Pulse 84   Ht 5\' 6"  (1.676 m)   Wt 209 lb 6.4 oz (95 kg)   LMP 07/26/2011   SpO2 98%   BMI 33.80 kg/m   History of Present Illness [name] is a 65 y.o. female with asthma.  She is followed by Dr. Jamison Neighbor.  She developed sinus congestion, post nasal drip and cough since yesterday.  She is having clear drainage.  She has sore throat.  She is getting some wheezing.  Xopenex helps some.  She denies fever, skin rash, headache, ear pain, gland swelling, reflux, abdominal pain,  or diarrhea.  She denies sick exposures, or environmental exposures.  Physical Exam  General - No distress ENT - No sinus tenderness, no oral exudate, no LAN, raspy voice, no stridor, mild cerumen build up Rt > Lt Cardiac - s1s2 regular, no murmur Chest - No wheeze/rales/dullness Back - No focal tenderness Abd - Soft, non-tender Ext - No edema Neuro - Normal strength Skin - No rashes Psych - normal mood, and behavior  FeNO 12/26/15 >> 47  Assessment/Plan  Acute asthmatic bronchitis. - will give course of prednisone - hydrocodone prn for cough - continue pulmicort, prn xopenex - defer Abx, CXR, labs for now   Patient Instructions  Prednisone 10 mg pill >> 3 pills daily for 2 days, 2 pills daily for 2  days, 1 pill daily for 2 days  Hydrocodone 5 ml every 6 hours as needed for cough  Call if not feeling better by next week  Otherwise follow up with Dr. Jamison NeighborNestor in October 2017    Coralyn HellingVineet Savanah Bayles, MD [facility] Pager:  [phone] 12/26/2015, 9:55 AM

## 2015-12-26 NOTE — Patient Instructions (Addendum)
Prednisone 10 mg pill >> 3 pills daily for 2 days, 2 pills daily for 2 days, 1 pill daily for 2 days  Hydrocodone 5 ml every 6 hours as needed for cough  Call if not feeling better by next week  Otherwise follow up with Dr. Jamison Neighbor in October 2017

## 2015-12-26 NOTE — Addendum Note (Signed)
Addended by: Maisie Fus on: 12/26/2015 09:59 AM   Modules accepted: Orders

## 2015-12-31 ENCOUNTER — Inpatient Hospital Stay (HOSPITAL_COMMUNITY)
Admission: AD | Admit: 2015-12-31 | Discharge: 2016-01-03 | DRG: 202 | Disposition: A | Discharge status: Home / Self Care | Payer: Medicare Other | Source: Ambulatory Visit | Attending: Pulmonary Disease | Admitting: Pulmonary Disease

## 2015-12-31 ENCOUNTER — Inpatient Hospital Stay (HOSPITAL_COMMUNITY): Payer: Medicare Other

## 2015-12-31 ENCOUNTER — Encounter (HOSPITAL_COMMUNITY): Payer: Self-pay

## 2015-12-31 ENCOUNTER — Encounter: Payer: Self-pay | Admitting: Pulmonary Disease

## 2015-12-31 ENCOUNTER — Ambulatory Visit (INDEPENDENT_AMBULATORY_CARE_PROVIDER_SITE_OTHER): Payer: Medicare Other | Admitting: Pulmonary Disease

## 2015-12-31 VITALS — BP 100/80 | HR 95

## 2015-12-31 DIAGNOSIS — M170 Bilateral primary osteoarthritis of knee: Secondary | ICD-10-CM | POA: Diagnosis present

## 2015-12-31 DIAGNOSIS — Z8249 Family history of ischemic heart disease and other diseases of the circulatory system: Secondary | ICD-10-CM | POA: Diagnosis not present

## 2015-12-31 DIAGNOSIS — N179 Acute kidney failure, unspecified: Secondary | ICD-10-CM | POA: Diagnosis present

## 2015-12-31 DIAGNOSIS — E785 Hyperlipidemia, unspecified: Secondary | ICD-10-CM | POA: Diagnosis not present

## 2015-12-31 DIAGNOSIS — Z8674 Personal history of sudden cardiac arrest: Secondary | ICD-10-CM | POA: Diagnosis not present

## 2015-12-31 DIAGNOSIS — Z888 Allergy status to other drugs, medicaments and biological substances status: Secondary | ICD-10-CM

## 2015-12-31 DIAGNOSIS — T380X5A Adverse effect of glucocorticoids and synthetic analogues, initial encounter: Secondary | ICD-10-CM | POA: Diagnosis not present

## 2015-12-31 DIAGNOSIS — Z96652 Presence of left artificial knee joint: Secondary | ICD-10-CM | POA: Diagnosis present

## 2015-12-31 DIAGNOSIS — Z95810 Presence of automatic (implantable) cardiac defibrillator: Secondary | ICD-10-CM

## 2015-12-31 DIAGNOSIS — Z7951 Long term (current) use of inhaled steroids: Secondary | ICD-10-CM | POA: Diagnosis not present

## 2015-12-31 DIAGNOSIS — I4891 Unspecified atrial fibrillation: Secondary | ICD-10-CM

## 2015-12-31 DIAGNOSIS — Z91040 Latex allergy status: Secondary | ICD-10-CM

## 2015-12-31 DIAGNOSIS — I5022 Chronic systolic (congestive) heart failure: Secondary | ICD-10-CM | POA: Diagnosis not present

## 2015-12-31 DIAGNOSIS — Z794 Long term (current) use of insulin: Secondary | ICD-10-CM

## 2015-12-31 DIAGNOSIS — I110 Hypertensive heart disease with heart failure: Secondary | ICD-10-CM | POA: Diagnosis present

## 2015-12-31 DIAGNOSIS — J4541 Moderate persistent asthma with (acute) exacerbation: Principal | ICD-10-CM

## 2015-12-31 DIAGNOSIS — IMO0002 Reserved for concepts with insufficient information to code with codable children: Secondary | ICD-10-CM

## 2015-12-31 DIAGNOSIS — I480 Paroxysmal atrial fibrillation: Secondary | ICD-10-CM | POA: Diagnosis not present

## 2015-12-31 DIAGNOSIS — G4733 Obstructive sleep apnea (adult) (pediatric): Secondary | ICD-10-CM | POA: Diagnosis not present

## 2015-12-31 DIAGNOSIS — J45901 Unspecified asthma with (acute) exacerbation: Secondary | ICD-10-CM

## 2015-12-31 DIAGNOSIS — R062 Wheezing: Secondary | ICD-10-CM | POA: Diagnosis not present

## 2015-12-31 DIAGNOSIS — Z7952 Long term (current) use of systemic steroids: Secondary | ICD-10-CM

## 2015-12-31 DIAGNOSIS — E1165 Type 2 diabetes mellitus with hyperglycemia: Secondary | ICD-10-CM | POA: Diagnosis present

## 2015-12-31 DIAGNOSIS — I10 Essential (primary) hypertension: Secondary | ICD-10-CM

## 2015-12-31 DIAGNOSIS — Z833 Family history of diabetes mellitus: Secondary | ICD-10-CM

## 2015-12-31 DIAGNOSIS — Z79899 Other long term (current) drug therapy: Secondary | ICD-10-CM | POA: Diagnosis not present

## 2015-12-31 DIAGNOSIS — Z881 Allergy status to other antibiotic agents status: Secondary | ICD-10-CM | POA: Diagnosis not present

## 2015-12-31 DIAGNOSIS — Z7901 Long term (current) use of anticoagulants: Secondary | ICD-10-CM | POA: Diagnosis not present

## 2015-12-31 DIAGNOSIS — K219 Gastro-esophageal reflux disease without esophagitis: Secondary | ICD-10-CM | POA: Diagnosis present

## 2015-12-31 DIAGNOSIS — R05 Cough: Secondary | ICD-10-CM | POA: Diagnosis not present

## 2015-12-31 DIAGNOSIS — E1140 Type 2 diabetes mellitus with diabetic neuropathy, unspecified: Secondary | ICD-10-CM

## 2015-12-31 DIAGNOSIS — J4540 Moderate persistent asthma, uncomplicated: Secondary | ICD-10-CM

## 2015-12-31 DIAGNOSIS — R0600 Dyspnea, unspecified: Secondary | ICD-10-CM | POA: Diagnosis not present

## 2015-12-31 LAB — COMPREHENSIVE METABOLIC PANEL
ALBUMIN: 3.5 g/dL (ref 3.5–5.0)
ALT: 39 U/L (ref 14–54)
ANION GAP: 11 (ref 5–15)
AST: 29 U/L (ref 15–41)
Alkaline Phosphatase: 83 U/L (ref 38–126)
BUN: 16 mg/dL (ref 6–20)
CO2: 25 mmol/L (ref 22–32)
Calcium: 8.5 mg/dL — ABNORMAL LOW (ref 8.9–10.3)
Chloride: 100 mmol/L — ABNORMAL LOW (ref 101–111)
Creatinine, Ser: 1.29 mg/dL — ABNORMAL HIGH (ref 0.44–1.00)
GFR calc non Af Amer: 43 mL/min — ABNORMAL LOW (ref >60)
GFR, EST AFRICAN AMERICAN: 50 mL/min — AB (ref >60)
GLUCOSE: 145 mg/dL — AB (ref 65–99)
POTASSIUM: 3.9 mmol/L (ref 3.5–5.1)
SODIUM: 136 mmol/L (ref 135–145)
TOTAL PROTEIN: 7.9 g/dL (ref 6.5–8.1)
Total Bilirubin: 0.7 mg/dL (ref 0.3–1.2)

## 2015-12-31 LAB — CBC WITH DIFFERENTIAL/PLATELET
BASOS ABS: 0 10*3/uL (ref 0.0–0.1)
BASOS PCT: 0 %
EOS ABS: 0 10*3/uL (ref 0.0–0.7)
Eosinophils Relative: 0 %
HEMATOCRIT: 37.4 % (ref 36.0–46.0)
Hemoglobin: 12.1 g/dL (ref 12.0–15.0)
Lymphocytes Relative: 44 %
Lymphs Abs: 4 10*3/uL (ref 0.7–4.0)
MCH: 28.5 pg (ref 26.0–34.0)
MCHC: 32.4 g/dL (ref 30.0–36.0)
MCV: 88.2 fL (ref 78.0–100.0)
MONO ABS: 0.5 10*3/uL (ref 0.1–1.0)
Monocytes Relative: 5 %
NEUTROS ABS: 4.6 10*3/uL (ref 1.7–7.7)
NEUTROS PCT: 51 %
Platelets: 261 10*3/uL (ref 150–400)
RBC: 4.24 MIL/uL (ref 3.87–5.11)
RDW: 13.4 % (ref 11.5–15.5)
WBC: 9.1 10*3/uL (ref 4.0–10.5)

## 2015-12-31 LAB — PROTIME-INR
INR: 2.9
PROTHROMBIN TIME: 30.9 s — AB (ref 11.4–15.2)

## 2015-12-31 LAB — GLUCOSE, CAPILLARY
GLUCOSE-CAPILLARY: 210 mg/dL — AB (ref 65–99)
Glucose-Capillary: 137 mg/dL — ABNORMAL HIGH (ref 65–99)

## 2015-12-31 LAB — TROPONIN I

## 2015-12-31 LAB — BRAIN NATRIURETIC PEPTIDE: B NATRIURETIC PEPTIDE 5: 79.5 pg/mL (ref 0.0–100.0)

## 2015-12-31 LAB — PHOSPHORUS: PHOSPHORUS: 2.8 mg/dL (ref 2.5–4.6)

## 2015-12-31 LAB — MAGNESIUM: Magnesium: 1.9 mg/dL (ref 1.7–2.4)

## 2015-12-31 MED ORDER — TORSEMIDE 20 MG PO TABS
20.0000 mg | ORAL_TABLET | Freq: Every day | ORAL | Status: DC
  Administered 2016-01-01 – 2016-01-03 (×3): 20 mg via ORAL
  Filled 2015-12-31 (×3): qty 1

## 2015-12-31 MED ORDER — ALBUTEROL (5 MG/ML) CONTINUOUS INHALATION SOLN
10.0000 mg/h | INHALATION_SOLUTION | RESPIRATORY_TRACT | Status: DC
  Administered 2015-12-31: 10 mg/h via RESPIRATORY_TRACT
  Filled 2015-12-31: qty 20

## 2015-12-31 MED ORDER — SODIUM CHLORIDE 0.9 % IV SOLN: 250.0000 mL | INTRAVENOUS | Status: DC | PRN

## 2015-12-31 MED ORDER — IPRATROPIUM-ALBUTEROL 0.5-2.5 (3) MG/3ML IN SOLN
3.0000 mL | Freq: Four times a day (QID) | RESPIRATORY_TRACT | Status: DC
  Administered 2015-12-31 – 2016-01-02 (×8): 3 mL via RESPIRATORY_TRACT
  Filled 2015-12-31 (×8): qty 3

## 2015-12-31 MED ORDER — WARFARIN - PHARMACIST DOSING INPATIENT: Freq: Every day | Status: DC

## 2015-12-31 MED ORDER — BUPROPION HCL ER (XL) 150 MG PO TB24
150.0000 mg | ORAL_TABLET | Freq: Every day | ORAL | Status: DC
  Administered 2016-01-01 – 2016-01-03 (×3): 150 mg via ORAL
  Filled 2015-12-31 (×3): qty 1

## 2015-12-31 MED ORDER — ONDANSETRON HCL 4 MG/2ML IJ SOLN: 4.0000 mg | Freq: Four times a day (QID) | INTRAMUSCULAR | Status: DC | PRN

## 2015-12-31 MED ORDER — ROSUVASTATIN CALCIUM 10 MG PO TABS
10.0000 mg | ORAL_TABLET | Freq: Every day | ORAL | Status: DC
  Administered 2016-01-01 – 2016-01-03 (×3): 10 mg via ORAL
  Filled 2015-12-31 (×3): qty 1

## 2015-12-31 MED ORDER — ALBUTEROL SULFATE (2.5 MG/3ML) 0.083% IN NEBU
2.5000 mg | INHALATION_SOLUTION | RESPIRATORY_TRACT | Status: DC | PRN
  Administered 2015-12-31: 2.5 mg via RESPIRATORY_TRACT
  Filled 2015-12-31: qty 3

## 2015-12-31 MED ORDER — DOFETILIDE 250 MCG PO CAPS
250.0000 ug | ORAL_CAPSULE | Freq: Two times a day (BID) | ORAL | Status: DC
  Administered 2015-12-31 – 2016-01-03 (×6): 250 ug via ORAL
  Filled 2015-12-31 (×6): qty 1

## 2015-12-31 MED ORDER — METHYLPREDNISOLONE ACETATE 80 MG/ML IJ SUSP
120.0000 mg | Freq: Once | INTRAMUSCULAR | Status: DC
  Administered 2015-12-31: 120 mg via INTRAMUSCULAR

## 2015-12-31 MED ORDER — INSULIN GLARGINE 100 UNIT/ML ~~LOC~~ SOLN
50.0000 [IU] | Freq: Every day | SUBCUTANEOUS | Status: DC
  Administered 2016-01-02 – 2016-01-03 (×2): 50 [IU] via SUBCUTANEOUS
  Filled 2015-12-31 (×3): qty 0.5

## 2015-12-31 MED ORDER — SPIRONOLACTONE 25 MG PO TABS
25.0000 mg | ORAL_TABLET | Freq: Every day | ORAL | Status: DC
  Administered 2015-12-31 – 2016-01-01 (×2): 25 mg via ORAL
  Filled 2015-12-31 (×2): qty 1

## 2015-12-31 MED ORDER — ENOXAPARIN SODIUM 40 MG/0.4ML ~~LOC~~ SOLN: 40.0000 mg | SUBCUTANEOUS | Status: DC

## 2015-12-31 MED ORDER — ACETAMINOPHEN 325 MG PO TABS: 650.0000 mg | ORAL_TABLET | ORAL | Status: DC | PRN

## 2015-12-31 MED ORDER — INSULIN ASPART 100 UNIT/ML ~~LOC~~ SOLN
0.0000 [IU] | SUBCUTANEOUS | Status: DC
  Administered 2015-12-31: 4 [IU] via SUBCUTANEOUS
  Administered 2016-01-01: 11 [IU] via SUBCUTANEOUS
  Administered 2016-01-01: 7 [IU] via SUBCUTANEOUS
  Administered 2016-01-01: 15 [IU] via SUBCUTANEOUS
  Administered 2016-01-01: 20 [IU] via SUBCUTANEOUS
  Administered 2016-01-01: 15 [IU] via SUBCUTANEOUS
  Administered 2016-01-02: 7 [IU] via SUBCUTANEOUS
  Administered 2016-01-02: 11 [IU] via SUBCUTANEOUS
  Administered 2016-01-02: 3 [IU] via SUBCUTANEOUS
  Administered 2016-01-02: 20 [IU] via SUBCUTANEOUS
  Administered 2016-01-02: 7 [IU] via SUBCUTANEOUS
  Administered 2016-01-02 – 2016-01-03 (×2): 11 [IU] via SUBCUTANEOUS
  Administered 2016-01-03: 4 [IU] via SUBCUTANEOUS

## 2015-12-31 MED ORDER — WARFARIN SODIUM 5 MG PO TABS
5.0000 mg | ORAL_TABLET | Freq: Once | ORAL | Status: AC
  Administered 2015-12-31: 5 mg via ORAL
  Filled 2015-12-31: qty 1

## 2015-12-31 MED ORDER — ALBUTEROL SULFATE (2.5 MG/3ML) 0.083% IN NEBU
2.5000 mg | INHALATION_SOLUTION | Freq: Once | RESPIRATORY_TRACT | Status: DC
  Administered 2015-12-31: 2.5 mg via RESPIRATORY_TRACT

## 2015-12-31 MED ORDER — METHYLPREDNISOLONE SODIUM SUCC 125 MG IJ SOLR
80.0000 mg | Freq: Four times a day (QID) | INTRAMUSCULAR | Status: DC
  Administered 2015-12-31 – 2016-01-01 (×3): 80 mg via INTRAVENOUS
  Filled 2015-12-31 (×3): qty 2

## 2015-12-31 NOTE — Progress Notes (Addendum)
Time interval 0300-9233: Arrived to pt room, pt direct admit from Dr. Reginia Naas office, pt in distress, CN and other staff RN her e to greet and settle pt in prior to my arrival. Pt noted with labored respirations, wheezing and decrease bil air movement, tachypneic--rate in the 30's. CN called for orders, Iv started and breathing treatments iniated as ordered. Solmedrol also given as ordered. Resp called  To assess pt and arrived, another breathing treament given( see MAR for all specific time for med admistration). Pt begin to settle down, decrease labored breathing. Rapid response RN called for support, pt given Albuterol neb per RRT protocol. RRT RN stayed with pt with patient during the process. Pt is starting to respond to treatment and breathing is improved, rate of 20-22. SRP, RN

## 2015-12-31 NOTE — Progress Notes (Signed)
Pt resting without distress, continue to respond to breathing treatment Albuterol neb. . Pt resp is 20-22. Will cont to monitor.SRP, RN

## 2015-12-31 NOTE — Progress Notes (Signed)
   Subjective:    Patient ID: [name], female    DOB: 21-Jul-1950, 65 y.o.   MRN: [id number]  HPI  65 year old with moderate persistent asthma and chronic systolic heart failure  Chief Complaint  Patient presents with  . Acute Visit    Wheezing, coughing up yellow mucus, can't talk because she can't catch her breath.  chest very tight.    She was seen in the office on 9/14 for acute asthmatic bronchitis and given prednisone-she initially improved but got worse again and came back to the office today with an asthma exacerbation and increased wheezing  She was taken to her room emergently and given Depo-Medrol 120 mg IM and albuterol neb. She already taken an albuterol neb in the morning No relief with this and hence hospital admission is advised  Past Medical History:  Diagnosis Date  . AICD (automatic cardioverter/defibrillator) present    [employer]  . Anemia   . Anxiety   . Arthritis   . Asthma    has had multiple hospitalizations for this  . Atrial fibrillation (HCC)    ablation x 2 [facility], 01/2006, 2011.  on warfarin  . Cardiac arrest [facility]) jan 2012   in hospital for pneumonia when this occured- occured at the hospital  . CHF (congestive heart failure) (HCC) 2012   Echo 08/08/10 by [facility]. EF 35-45%. LV systolic function moderately reduced. Moderate global hypokinesis of LV.  RV systolic function moderately reduced. Mild MR. Trace AR.  Marland Kitchen Complication of anesthesia    difficult time waking up after anesthesia  . Depression   . Diabetes mellitus    Type 2  . Dysrhythmia    Atrial fibrillation  . GASTROESOPHAGEAL REFLUX, NO ESOPHAGITIS 06/10/2006   Qualifier: Diagnosis of  By: Abundio Miu    . GERD (gastroesophageal reflux disease)   . History of hiatal hernia   . Hyperlipidemia   . Hypertension   . Limb pain 06/04/2008   LLE, Baker's cyst in popliteal fossa, no DVT  . Non-ischemic cardiomyopathy (HCC)    echo 08/08/10 - EF 35-45% LV and RV systolic  function mod reduced  . Primary localized osteoarthritis of left knee   . Primary localized osteoarthritis of right knee   . Primary localized osteoarthritis of right knee   . Sleep apnea    wears CPAP nightly  . Vocal cord disease   . Wears glasses        Review of Systems neg for any significant sore throat, dysphagia, itching, sneezing, nasal congestion or excess/ purulent secretions, fever, chills, sweats, unintended wt loss, pleuritic or exertional cp, hempoptysis, orthopnea pnd or change in chronic leg swelling.   Also denies presyncope, palpitations, heartburn, abdominal pain, nausea, vomiting, diarrhea or change in bowel or urinary habits, dysuria,hematuria, rash, arthralgias, visual complaints, headache, numbness weakness or ataxia.     Objective:   Physical Exam  Gen. Pleasant, well-nourished, in no distress ENT - no lesions, no post nasal drip Neck: No JVD, no thyromegaly, no carotid bruits Lungs: no use of accessory muscles, no dullness to percussion, Bilateral diffuse rhonchi  Cardiovascular: Rhythm regular, heart sounds  normal, no murmurs or gallops, no peripheral edema Musculoskeletal: No deformities, no cyanosis or clubbing        Assessment & Plan:

## 2015-12-31 NOTE — Progress Notes (Signed)
Dr. Cherly Beach returned call and updated MD on pt status. Pt able to communicate without acute distress. Will continue to monitor, Neb treatment continues. SRP, RN

## 2015-12-31 NOTE — Progress Notes (Signed)
Rt called to room 1425 due to pt SOB. Rt gave albuterol 2.5 mg pt had no improvement. Rapid response called rt talked to [facility] RN regrading pt needs a 10mg  albuterol continuous neb. Neb order rt started tx. Pt vitals stable but pt is very DIM/WH with a cough. RN at bedside pt is on a monitor.

## 2015-12-31 NOTE — Progress Notes (Addendum)
Called to room 1425 for Asthma exacerbation. Direct admit from MD office. Lungs extremely tight with decreased breath sounds. Dr Dellie Catholic at [facility] called - involved  with an emergent situation. RT here and agrees that continuous Albuterol neb would benefit. Ordered per RRT protocol. Patient able to converse with complete sentences. Able to follow commands. Daughter at bedside. Both reassured. Patient not in active distress at this time. RRT staying with patient while treatment starts. VSS BP 113-119/70-76, HR 70's, Paced, RR 14-18, Sats 100 on contiuous Albuterol neb currently. Patient states she is feeling better, more open now, decreased work of breathing. BP 123/69, HR 90, Paced, RR 16 Sats 100% on continuous neb. Patient A/O, FC, Able to converse in complete sentences. RN informed to call RRT if needed further.

## 2015-12-31 NOTE — H&P (Signed)
Name: [name] C Manganaro MRN: [id number] DOB: 06/03/1950    ADMISSION DATE:  12/31/2015   CHIEF COMPLAINT:  Respiratory distress and wheezing  HISTORY OF PRESENT ILLNESS:  65 year old with moderate persistent asthma and chronic systolic heart failure      Chief Complaint  Patient presents with  . Acute Visit    Wheezing, coughing up yellow mucus, can't talk because she can't catch her breath.  chest very tight.    She was seen in the office on 9/14 for acute asthmatic bronchitis and given prednisone-she initially improved but got worse again and came back to the office today with an asthma exacerbation and increased wheezing  She was taken to her room emergently and given Depo-Medrol 120 mg IM and albuterol neb. She already taken an albuterol neb in the morning No relief with this and hence hospital admission is advised   SIGNIFICANT EVENTS  Admission from office 9/19  STUDIES:  Echo 2012 EF 35%     PAST MEDICAL HISTORY :   has a past medical history of AICD (automatic cardioverter/defibrillator) present; Anemia; Anxiety; Arthritis; Asthma; Atrial fibrillation (HCC); Cardiac arrest [facility](HCC) (jan 2012); CHF (congestive heart failure) (HCC) (2012); Complication of anesthesia; Depression; Diabetes mellitus; Dysrhythmia; GASTROESOPHAGEAL REFLUX, NO ESOPHAGITIS (06/10/2006); GERD (gastroesophageal reflux disease); History of hiatal hernia; Hyperlipidemia; Hypertension; Limb pain (06/04/2008); Non-ischemic cardiomyopathy (HCC); Primary localized osteoarthritis of left knee; Primary localized osteoarthritis of right knee; Primary localized osteoarthritis of right knee; Sleep apnea; Vocal cord disease; and Wears glasses.  has a past surgical history that includes Cholecystectomy; Knee arthroscopy (Right); Cardiac defibrillator placement (05/02/10); PAF Ablation; Colonoscopy; Tubal ligation; Breast lumpectomy (Right); Hammer toe surgery (Right); Total knee arthroplasty (Left, 09/17/2014); and Total  knee arthroplasty (Left, 09/17/2014). Prior to Admission medications   Medication Sig Start Date End Date Taking? Authorizing Provider  B-D ULTRAFINE III SHORT PEN 31G X 8 MM MISC use as directed    Dayarmys [place] Cris[place]da Peaches[place]az, MD  budesonide (PULMICORT FLEXHALER) 180 MCG/ACT inhaler Inhale 2 puffs into the lungs 2 (two) times daily. 08/01/14   Storm FriskPatrick E Wright, MD  buPROPion (WELLBUTRIN XL) 150 MG 24 hr tablet take 1 tablet by mouth once daily 03/25/15   Lora Havensaleigh N New DouglasRumley, DO  chlorpheniramine (CHLOR-TRIMETON) 4 MG tablet 8mg  at night 10/17/12   Storm FriskPatrick E Wright, MD  CRESTOR 10 MG tablet take 1 tablet by mouth once daily 01/09/15   Thurmon FairMihai Croitoru, MD  dofetilide (TIKOSYN) 250 MCG capsule Take 250 mcg by mouth 2 (two) times daily. Reported on 09/26/2015    Historical Provider, MD  fluticasone (FLONASE) 50 MCG/ACT nasal spray instill 1 spray into each nostril twice a day 08/05/15   [place] N Rumley, DO  HYDROcodone-homatropine [facility](HYCODAN) 5-1.5 MG/5ML syrup Take 5 mLs by mouth every 6 (six) hours as needed for cough. 12/26/15   Kalman ShanMurali Ramaswamy, MD  insulin aspart (NOVOLOG FLEXPEN) 100 UNIT/ML FlexPen Please inject 10units prior to lunch and 7units prior to supper. 09/02/15   [place] N Rumley, DO  Insulin Glargine (LANTUS) 100 UNIT/ML Solostar Pen Inject 50 Units into the skin at bedtime. Patient taking differently: Inject 50 Units into the skin every morning.  10/01/15   Lora Havensaleigh N Rumley, DO  Insulin Pen Needle (B-D ULTRAFINE III SHORT PEN) 31G X 8 MM MISC Use as directed 09/17/15   Araceli Bouchealeigh N Rumley, DO  levalbuterol Pauline Aus(XOPENEX) 0.63 MG/3ML nebulizer solution One vial in nebulizer four times daily as needed dx 493.00 09/01/13   Storm FriskPatrick E Wright, MD  losartan (COZAAR) 50 MG tablet take 1 tablet by mouth once daily 12/27/14   [facility], DO  metoprolol (LOPRESSOR) 50 MG tablet Take 1.5 tablets (75 mg total) by mouth 2 (two) times daily. 10/29/15   Mihai Croitoru, MD  NEXIUM 40 MG capsule take 1 capsule by mouth once  daily 10/29/15   Roslynn Amble, MD  polyethylene glycol powder (GLYCOLAX/MIRALAX) powder take 17GM (DISSOLVED IN WATER) by mouth once daily PRN, takes three times a week 07/08/15   Lora Havens Rumley, DO  predniSONE (DELTASONE) 10 MG tablet Take 3 tablets po x 2 days, then 2 x 2 days, then 1 x 2 days then stop 12/26/15   Kalman Shan, MD  RA LORATADINE 10 MG tablet take 1 tablet by mouth once daily 07/12/13   Storm Frisk, MD  spironolactone (ALDACTONE) 25 MG tablet Take 1 tablet (25 mg total) by mouth daily. 09/24/15   Mihai Croitoru, MD  torsemide (DEMADEX) 10 MG tablet Take 2 tablets by mouth once daily, may take additional 2 tablets as directed 09/18/15   Thurmon Fair, MD  warfarin (COUMADIN) 5 MG tablet take 1 and 1/2 by mouth once daily 09/26/15   Mihai Croitoru, MD  XOPENEX HFA 45 MCG/ACT inhaler INHALE 1 TO 2 PUFFS BY MOUTH INTO THE LUNGS EVERY 4 HOURS IF NEEDED FOR WHEEZING 11/28/15   Roslynn Amble, MD   Allergies  Allergen Reactions  . Avelox [Moxifloxacin Hcl In Nacl] Other (See Comments)    Cardiac arrest per pt  . Simvastatin Other (See Comments)    myalgias  . Ace Inhibitors Cough    Dry cough   . Latex Rash    FAMILY HISTORY:  family history includes Diabetes in her father; Hypertension in her father. SOCIAL HISTORY:  reports that she has never smoked. She has never used smokeless tobacco. She reports that she does not drink alcohol or use drugs.  REVIEW OF SYSTEMS:   Constitutional: Negative for fever, chills, weight loss, malaise/fatigue and diaphoresis.  HENT: Negative for hearing loss, ear pain, nosebleeds, congestion, sore throat, neck pain, tinnitus and ear discharge.   Eyes: Negative for blurred vision, double vision, photophobia, pain, discharge and redness.  Respiratory: Negative hemoptysis positive for cough, wheezing and shortness of breath Cardiovascular: Negative for chest pain, palpitations, orthopnea, claudication, leg swelling and PND.    Gastrointestinal: Negative for heartburn, nausea, vomiting, abdominal pain, diarrhea, constipation, blood in stool and melena.  Genitourinary: Negative for dysuria, urgency, frequency, hematuria and flank pain.  Musculoskeletal: Negative for myalgias, back pain, joint pain and falls.  Skin: Negative for itching and rash.  Neurological: Negative for dizziness, tingling, tremors, sensory change, speech change, focal weakness, seizures, loss of consciousness, weakness and headaches.  Endo/Heme/Allergies: Negative for environmental allergies and polydipsia. Does not bruise/bleed easily.  SUBJECTIVE:   VITAL SIGNS: Pulse Rate:  [95-100] 100 (09/19 1613) Resp:  [30] 30 (09/19 1613) BP: (100-132)/(80-93) 132/93 (09/19 1613) SpO2:  [100 %] 100 % (09/19 1613)  PHYSICAL EXAMINATION: Gen. Pleasant, well-nourished, in no distress, normal affect ENT - no lesions, no post nasal drip Neck: No JVD, no thyromegaly, no carotid bruits Lungs: no use of accessory muscles, no dullness to percussion, diffuse rhonchi, no crackles   Cardiovascular: Rhythm regular, heart sounds  normal, no murmurs or gallops, no peripheral edema Abdomen: soft and non-tender, no hepatosplenomegaly, BS normal. Musculoskeletal: No deformities, no cyanosis or clubbing Neuro:  alert, non focal   No results for input(s): NA, K, CL, CO2,  BUN, CREATININE, GLUCOSE in the last 168 hours. No results for input(s): HGB, HCT, WBC, PLT in the last 168 hours. Dg Chest Port 1 View  Result Date: 12/31/2015 CLINICAL DATA:  Wheezing and coughing up yellow mucus. EXAM: PORTABLE CHEST 1 VIEW COMPARISON:  04/22/2015 FINDINGS: Again noted is a left dual chamber cardiac ICD. Lungs are clear without airspace disease or pulmonary edema. Heart size is with normal limits and stable. The trachea is midline. No large pleural effusions. IMPRESSION: No acute findings. Electronically Signed   By: Richarda Overlie M.D.   On: 12/31/2015 16:55    ASSESSMENT /  PLAN:  Acute asthma exacerbation- given 80 mg of Medrol and albuterol 11 the office without significant relief hence will be admitted to the hospital  -We'll treat with IV Solu-Medrol 80 every 6, 2 nebs every 6 and albuterol every 2 hours when necessary keeping a watch and tachycardia, have significant tachycardia will change to Xopenex nebs  Chronic systolic heart failure/atrial fibrillation- Telemetry Check BNP , no evidence of fluid overload clinically, on exam or on chest x-ray  Diabetes type 2, insulin requiring- we'll place her on SSI resistance scale and resume home dose of Lantus 15 units at bedtime Expect sugars rise with steroids  DVT prophylaxis with subcutaneous Lovenox and SCDs  Cyril Mourning MD. FCCP. [place] [facility] Pager [phone] If no response call [phone]    12/31/2015, 4:58 PM

## 2015-12-31 NOTE — Progress Notes (Signed)
ANTICOAGULATION CONSULT NOTE - Initial Consult  Pharmacy Consult for Warfarin Indication: Atrial Fibrillation  Allergies  Allergen Reactions  . Avelox [Moxifloxacin Hcl In Nacl] Other (See Comments)    Cardiac arrest per pt  . Simvastatin Other (See Comments)    myalgias  . Ace Inhibitors Cough    Dry cough   . Latex Rash    Patient Measurements: Height: 5\' 6"  (167.6 cm) IBW/kg (Calculated) : 59.3 Heparin Dosing Weight:   Vital Signs: BP: 132/93 (09/19 1613) Pulse Rate: 100 (09/19 1613)  Labs: No results for input(s): HGB, HCT, PLT, APTT, LABPROT, INR, HEPARINUNFRC, HEPRLOWMOCWT, CREATININE, CKTOTAL, CKMB, TROPONINI in the last 72 hours.  CrCl cannot be calculated (Patient's most recent lab result is older than the maximum 21 days allowed.).   Medical History: Past Medical History:  Diagnosis Date  . AICD (automatic cardioverter/defibrillator) present    Medtronic  . Anemia   . Anxiety   . Arthritis   . Asthma    has had multiple hospitalizations for this  . Atrial fibrillation (HCC)    ablation x 2 [facility], 01/2006, 2011.  on warfarin  . Cardiac arrest [facility]) jan 2012   in hospital for pneumonia when this occured- occured at the hospital  . CHF (congestive heart failure) (HCC) 2012   Echo 08/08/10 by [facility]. EF 35-45%. LV systolic function moderately reduced. Moderate global hypokinesis of LV.  RV systolic function moderately reduced. Mild MR. Trace AR.  Marland Kitchen Complication of anesthesia    difficult time waking up after anesthesia  . Depression   . Diabetes mellitus    Type 2  . Dysrhythmia    Atrial fibrillation  . GASTROESOPHAGEAL REFLUX, NO ESOPHAGITIS 06/10/2006   Qualifier: Diagnosis of  By: Abundio Miu    . GERD (gastroesophageal reflux disease)   . History of hiatal hernia   . Hyperlipidemia   . Hypertension   . Limb pain 06/04/2008   LLE, Baker's cyst in popliteal fossa, no DVT  . Non-ischemic cardiomyopathy (HCC)    echo 08/08/10 - EF  35-45% LV and RV systolic function mod reduced  . Primary localized osteoarthritis of left knee   . Primary localized osteoarthritis of right knee   . Primary localized osteoarthritis of right knee   . Sleep apnea    wears CPAP nightly  . Vocal cord disease   . Wears glasses     Medications:    Assessment: 37 yoF admitted with acute asthmatic bronchitis exacerbation.  Pt on chronic warfarin for atrial fibrillation and pharmacy consulted to resume dosing inpatient.    Home dose warfarin = 7.5mg  daily except 5mg  on M/W/F per OV on 12/05/15.  Pt unable to confirm home dose but family will try to call pharmacy tonight.   INR on admission = 2.90, therapeutic.  Hgb/Hct and Plts WNL.   Noted LMWH 40mg  q24h ordered from admission order set.   Goal of Therapy:  INR 2-3 Monitor platelets by anticoagulation protocol: Yes   Plan:  Warfarin 5mg  po x 1 tonight Daily PT/INR D/C lovenox as INR therapeutic  Haynes Hoehn, PharmD, BCPS 12/31/2015, 5:41 PM  Pager: [phone]

## 2015-12-31 NOTE — Patient Instructions (Addendum)
Hospital admission  to [facility] telemetry

## 2016-01-01 ENCOUNTER — Inpatient Hospital Stay (HOSPITAL_COMMUNITY): Payer: Medicare Other

## 2016-01-01 DIAGNOSIS — J45901 Unspecified asthma with (acute) exacerbation: Secondary | ICD-10-CM

## 2016-01-01 DIAGNOSIS — R0600 Dyspnea, unspecified: Secondary | ICD-10-CM

## 2016-01-01 LAB — BASIC METABOLIC PANEL
Anion gap: 17 — ABNORMAL HIGH (ref 5–15)
Anion gap: 13 (ref 5–15)
BUN: 22 mg/dL — AB (ref 6–20)
BUN: 27 mg/dL — AB (ref 6–20)
CALCIUM: 8.6 mg/dL — AB (ref 8.9–10.3)
CHLORIDE: 96 mmol/L — AB (ref 101–111)
CHLORIDE: 97 mmol/L — AB (ref 101–111)
CO2: 19 mmol/L — AB (ref 22–32)
CO2: 23 mmol/L (ref 22–32)
CREATININE: 1.49 mg/dL — AB (ref 0.44–1.00)
CREATININE: 1.48 mg/dL — AB (ref 0.44–1.00)
Calcium: 8.8 mg/dL — ABNORMAL LOW (ref 8.9–10.3)
GFR calc non Af Amer: 36 mL/min — ABNORMAL LOW (ref >60)
GFR calc non Af Amer: 36 mL/min — ABNORMAL LOW (ref >60)
GFR, EST AFRICAN AMERICAN: 42 mL/min — AB (ref >60)
GFR, EST AFRICAN AMERICAN: 42 mL/min — AB (ref >60)
Glucose, Bld: 382 mg/dL — ABNORMAL HIGH (ref 65–99)
Glucose, Bld: 329 mg/dL — ABNORMAL HIGH (ref 65–99)
Potassium: 4 mmol/L (ref 3.5–5.1)
Potassium: 4.1 mmol/L (ref 3.5–5.1)
Sodium: 132 mmol/L — ABNORMAL LOW (ref 135–145)
Sodium: 133 mmol/L — ABNORMAL LOW (ref 135–145)

## 2016-01-01 LAB — GLUCOSE, CAPILLARY
GLUCOSE-CAPILLARY: 383 mg/dL — AB (ref 65–99)
GLUCOSE-CAPILLARY: 288 mg/dL — AB (ref 65–99)
GLUCOSE-CAPILLARY: 313 mg/dL — AB (ref 65–99)
Glucose-Capillary: 228 mg/dL — ABNORMAL HIGH (ref 65–99)
Glucose-Capillary: 317 mg/dL — ABNORMAL HIGH (ref 65–99)
Glucose-Capillary: 386 mg/dL — ABNORMAL HIGH (ref 65–99)
Glucose-Capillary: 402 mg/dL — ABNORMAL HIGH (ref 65–99)

## 2016-01-01 LAB — PULMONARY FUNCTION TEST
FEF 25-75 PRE: 2.1 L/s
FEF2575-%PRED-PRE: 99 %
FEV1-%PRED-PRE: 102 %
FEV1-Pre: 2.3 L
FEV1FVC-%PRED-PRE: 100 %
FEV6-%Pred-Pre: 105 %
FEV6-Pre: 2.93 L
FEV6FVC-%PRED-PRE: 103 %
FVC-%Pred-Pre: 101 %
FVC-PRE: 2.93 L
PRE FEV1/FVC RATIO: 78 %
PRE FEV6/FVC RATIO: 100 %

## 2016-01-01 LAB — ECHOCARDIOGRAM COMPLETE
HEIGHTINCHES: 66 in
WEIGHTICAEL: 3302.4 [oz_av]

## 2016-01-01 LAB — PROTIME-INR
INR: 2.72
Prothrombin Time: 29.4 seconds — ABNORMAL HIGH (ref 11.4–15.2)

## 2016-01-01 MED ORDER — WARFARIN SODIUM 5 MG PO TABS
5.0000 mg | ORAL_TABLET | Freq: Once | ORAL | Status: AC
  Administered 2016-01-01: 5 mg via ORAL
  Filled 2016-01-01: qty 1

## 2016-01-01 MED ORDER — MAGNESIUM SULFATE 2 GM/50ML IV SOLN
2.0000 g | Freq: Once | INTRAVENOUS | Status: AC
Start: 2016-01-01 — End: 2016-01-01
  Administered 2016-01-01: 2 g via INTRAVENOUS
  Filled 2016-01-01: qty 50

## 2016-01-01 MED ORDER — GUAIFENESIN-DM 100-10 MG/5ML PO SYRP
10.0000 mL | ORAL_SOLUTION | Freq: Once | ORAL | Status: AC
  Administered 2016-01-01: 10 mL via ORAL
  Filled 2016-01-01: qty 10

## 2016-01-01 MED ORDER — METHYLPREDNISOLONE SODIUM SUCC 40 MG IJ SOLR
40.0000 mg | Freq: Four times a day (QID) | INTRAMUSCULAR | Status: DC
  Administered 2016-01-01 – 2016-01-02 (×4): 40 mg via INTRAVENOUS
  Filled 2016-01-01 (×4): qty 1

## 2016-01-01 MED ORDER — BISOPROLOL FUMARATE 5 MG PO TABS
5.0000 mg | ORAL_TABLET | Freq: Every day | ORAL | Status: DC
  Administered 2016-01-01 – 2016-01-02 (×2): 5 mg via ORAL
  Filled 2016-01-01 (×2): qty 1

## 2016-01-01 MED ORDER — INSULIN ASPART 100 UNIT/ML ~~LOC~~ SOLN
20.0000 [IU] | Freq: Once | SUBCUTANEOUS | Status: AC
  Administered 2016-01-01: 20 [IU] via SUBCUTANEOUS

## 2016-01-01 NOTE — H&P (Addendum)
Name: [name] MRN: [id number] DOB: 04/11/1951    ADMISSION DATE:  12/31/2015   CHIEF COMPLAINT:  Respiratory distress and wheezing  BRIEF:  65 year old with moderate persistent asthma and chronic systolic heart failure.   She was seen in the office on 9/14 for acute asthmatic bronchitis and given prednisone-she initially improved but got worse again and came back to the office today with an asthma exacerbation and increased wheezing. She was taken to her room emergently and given Depo-Medrol 120 mg IM and albuterol neb. She already taken an albuterol neb in the morning No relief with this and hence hospital admission is advised and admitted 12/31/2015   STUDIES:  Echo 2012 EF 35%  SIGNIFICANT EVENTS  Admission from office 9/19    SUBJECTIVE/OVERNIGHT/INTERVAL HX 01/01/16 - feels much better. PEr RN earlier this morning was unable to come off o2 but now off o2 x 10 min pulse ox 97%. RN also says patient much improved. Hyperglycemic on high dose steroids per RN and ssi modified and diet modified     VITAL SIGNS: Temp:  [97.7 F (36.5 C)-98.1 F (36.7 C)] 98.1 F (36.7 C) (09/20 0300) Pulse Rate:  [81-100] 81 (09/20 0300) Resp:  [16-30] 16 (09/20 0300) BP: (100-132)/(57-93) 110/64 (09/20 0300) SpO2:  [98 %-100 %] 99 % (09/20 0751) Weight:  [93.4 kg (205 lb 12.8 oz)-93.6 kg (206 lb 6.4 oz)] 93.6 kg (206 lb 6.4 oz) (09/20 0300)  PHYSICAL EXAMINATION: Gen. Pleasant, well-nourished, in no distress, normal affect ENT - no lesions, no post nasal drip Neck: No JVD, no thyromegaly, no carotid bruits Lungs: no use of accessory muscles, no dullness to percussion, NO rhonchi, no crackles  Cardiovascular: Rhythm regular, heart sounds  normal, no murmurs or gallops, no peripheral edema Abdomen: soft and non-tender, no hepatosplenomegaly, BS normal. Musculoskeletal: No deformities, no cyanosis or clubbing Neuro:  alert, non focal    PULMONARY No results for input(s): PHART,  PCO2ART, PO2ART, HCO3, TCO2, O2SAT in the last 168 hours.  Invalid input(s): PCO2, PO2  CBC  Recent Labs Lab 12/31/15 1636  HGB 12.1  HCT 37.4  WBC 9.1  PLT 261    COAGULATION  Recent Labs Lab 12/31/15 1636 01/01/16 0403  INR 2.90 2.72    CARDIAC   Recent Labs Lab 12/31/15 1636  TROPONINI <0.03   No results for input(s): PROBNP in the last 168 hours.   CHEMISTRY  Recent Labs Lab 12/31/15 1636 01/01/16 0403  NA 136 132*  K 3.9 4.0  CL 100* 96*  CO2 25 19*  GLUCOSE 145* 382*  BUN 16 22*  CREATININE 1.29* 1.49*  CALCIUM 8.5* 8.6*  MG 1.9  --   PHOS 2.8  --    Estimated Creatinine Clearance: 44 mL/min (by C-G formula based on SCr of 1.49 mg/dL (H)).   LIVER  Recent Labs Lab 12/31/15 1636 01/01/16 0403  AST 29  --   ALT 39  --   ALKPHOS 83  --   BILITOT 0.7  --   PROT 7.9  --   ALBUMIN 3.5  --   INR 2.90 2.72     INFECTIOUS No results for input(s): LATICACIDVEN, PROCALCITON in the last 168 hours.   ENDOCRINE CBG (last 3)   Recent Labs  01/01/16 0209 01/01/16 0416 01/01/16 0743  GLUCAP 386* 383* 288*         IMAGING x48h  - image(s) personally visualized  -   highlighted in bold [facility]  Result Date: 12/31/2015 CLINICAL DATA:  Wheezing and coughing up yellow mucus. EXAM: PORTABLE CHEST 1 VIEW COMPARISON:  04/22/2015 FINDINGS: Again noted is a left dual chamber cardiac ICD. Lungs are clear without airspace disease or pulmonary edema. Heart size is with normal limits and stable. The trachea is midline. No large pleural effusions. IMPRESSION: No acute findings. Electronically Signed   By: Richarda Overlie M.D.   On: 12/31/2015 16:55       ASSESSMENT / PLAN:  Acute asthma exacerbation-   - significantly better clinically  Plan  - reduced IV steroids (hyperglyucemic) - duoneb q4h to continue - magsulfate 2gm% (mag yesterday was , 2gm% at 1.9gm%) - check flow volume loop on spiro  - opd eval for IgE and eos - to  consider xolair or nucala/cinqair   Chronic systolic heart failure/atrial fibrillation-   - clnically euvolemic./ HR ok. Asking for her lopressor. Currently on tikosyn and warfarin  PLAN - check echo - continue tikosyn and warfarin - start bisoprolol (patient explained rationale of b1 selectivity) instead of lopressor - continue torsemeide - dc aldactone (rising creat)  AKI  - new - hold aldactone and repeat bmet 3pm; might need to hold torsemide   Diabetes type 2, insulin requiring- on SSI resistance scale andhome dose of Lantus 15 units at bedtime + carb modified diet    DVT prophylaxis with Rx dopse warfarin      Dr. Kalman Shan, M.D., [facility].C.P Pulmonary and Critical Care Medicine Staff Physician [facility] [place] Pulmonary and Critical Care Pager: [phone], If no answer or between  15:00h - 7:00h: call [phone]  01/01/2016 9:57 AM

## 2016-01-01 NOTE — Progress Notes (Signed)
Echocardiogram 2D Echocardiogram has been performed.  [name] 01/01/2016, 3:12 PM

## 2016-01-01 NOTE — Progress Notes (Signed)
ANTICOAGULATION CONSULT NOTE - Follow Up Consult  Pharmacy Consult for Warfarin Indication: Atrial Fibrillation  Allergies  Allergen Reactions  . Avelox [Moxifloxacin Hcl In Nacl] Other (See Comments)    Cardiac arrest per pt  . Simvastatin Other (See Comments)    myalgias  . Ace Inhibitors Cough    Dry cough   . Latex Rash    Patient Measurements: Height: 5\' 6"  (167.6 cm) Weight: 206 lb 6.4 oz (93.6 kg) IBW/kg (Calculated) : 59.3  Vital Signs: Temp: 98.1 F (36.7 C) (09/20 0300) Temp Source: Oral (09/20 0300) BP: 110/64 (09/20 0300) Pulse Rate: 81 (09/20 0300)  Labs:  Recent Labs  12/31/15 1636 01/01/16 0403  HGB 12.1  --   HCT 37.4  --   PLT 261  --   LABPROT 30.9* 29.4*  INR 2.90 2.72  CREATININE 1.29* 1.49*  TROPONINI <0.03  --     Estimated Creatinine Clearance: 44 mL/min (by C-G formula based on SCr of 1.49 mg/dL (H)).   Medical History: Past Medical History:  Diagnosis Date  . AICD (automatic cardioverter/defibrillator) present    Medtronic  . Anemia   . Anxiety   . Arthritis   . Asthma    has had multiple hospitalizations for this  . Atrial fibrillation (HCC)    ablation x 2 [facility], 01/2006, 2011.  on warfarin  . Cardiac arrest [facility]) jan 2012   in hospital for pneumonia when this occured- occured at the hospital  . CHF (congestive heart failure) (HCC) 2012   Echo 08/08/10 by [facility]. EF 35-45%. LV systolic function moderately reduced. Moderate global hypokinesis of LV.  RV systolic function moderately reduced. Mild MR. Trace AR.  Marland Kitchen Complication of anesthesia    difficult time waking up after anesthesia  . Depression   . Diabetes mellitus    Type 2  . Dysrhythmia    Atrial fibrillation  . GASTROESOPHAGEAL REFLUX, NO ESOPHAGITIS 06/10/2006   Qualifier: Diagnosis of  By: Abundio Miu    . GERD (gastroesophageal reflux disease)   . History of hiatal hernia   . Hyperlipidemia   . Hypertension   . Limb pain 06/04/2008   LLE,  Baker's cyst in popliteal fossa, no DVT  . Non-ischemic cardiomyopathy (HCC)    echo 08/08/10 - EF 35-45% LV and RV systolic function mod reduced  . Primary localized osteoarthritis of left knee   . Primary localized osteoarthritis of right knee   . Primary localized osteoarthritis of right knee   . Sleep apnea    wears CPAP nightly  . Vocal cord disease   . Wears glasses     Assessment: 29 yoF admitted with acute asthmatic bronchitis exacerbation.  Pt on chronic warfarin for atrial fibrillation and pharmacy consulted to resume dosing inpatient.    Home dose warfarin = 7.5mg  daily except 5mg  on M/W/F per OV on 12/05/15.  Regimen confirmed with family.  INR therapeutic on admission.  Today, 01/01/2016: - INR therapeutic at 2.72 - Hgb/Hct and Plts WNL on admission - Drug interactions: none significant, receiving steroids which may enhance warfarin's effect  Goal of Therapy:  INR 2-3 Monitor platelets by anticoagulation protocol: Yes   Plan:  Warfarin 5mg  po x 1 tonight Daily PT/INR  Clance Boll, PharmD, BCPS Pager: [phone] 01/01/2016 7:34 AM

## 2016-01-02 ENCOUNTER — Other Ambulatory Visit: Payer: Self-pay | Admitting: *Deleted

## 2016-01-02 LAB — CBC WITH DIFFERENTIAL/PLATELET
BASOS ABS: 0 10*3/uL (ref 0.0–0.1)
BASOS PCT: 0 %
EOS PCT: 0 %
Eosinophils Absolute: 0 10*3/uL (ref 0.0–0.7)
HCT: 37.9 % (ref 36.0–46.0)
Hemoglobin: 12.3 g/dL (ref 12.0–15.0)
Lymphocytes Relative: 6 %
Lymphs Abs: 1.3 10*3/uL (ref 0.7–4.0)
MCH: 28.7 pg (ref 26.0–34.0)
MCHC: 32.5 g/dL (ref 30.0–36.0)
MCV: 88.6 fL (ref 78.0–100.0)
MONO ABS: 1 10*3/uL (ref 0.1–1.0)
Monocytes Relative: 4 %
Neutro Abs: 21.5 10*3/uL — ABNORMAL HIGH (ref 1.7–7.7)
Neutrophils Relative %: 90 %
PLATELETS: 256 10*3/uL (ref 150–400)
RBC: 4.28 MIL/uL (ref 3.87–5.11)
RDW: 14 % (ref 11.5–15.5)
WBC: 23.8 10*3/uL — AB (ref 4.0–10.5)

## 2016-01-02 LAB — BASIC METABOLIC PANEL
ANION GAP: 11 (ref 5–15)
BUN: 30 mg/dL — ABNORMAL HIGH (ref 6–20)
CALCIUM: 9.3 mg/dL (ref 8.9–10.3)
CO2: 24 mmol/L (ref 22–32)
Chloride: 98 mmol/L — ABNORMAL LOW (ref 101–111)
Creatinine, Ser: 1.51 mg/dL — ABNORMAL HIGH (ref 0.44–1.00)
GFR, EST AFRICAN AMERICAN: 41 mL/min — AB (ref >60)
GFR, EST NON AFRICAN AMERICAN: 35 mL/min — AB (ref >60)
Glucose, Bld: 251 mg/dL — ABNORMAL HIGH (ref 65–99)
Potassium: 4.8 mmol/L (ref 3.5–5.1)
SODIUM: 133 mmol/L — AB (ref 135–145)

## 2016-01-02 LAB — PROTIME-INR
INR: 3.86
PROTHROMBIN TIME: 38.9 s — AB (ref 11.4–15.2)

## 2016-01-02 LAB — MAGNESIUM: MAGNESIUM: 2.4 mg/dL (ref 1.7–2.4)

## 2016-01-02 LAB — GLUCOSE, CAPILLARY
GLUCOSE-CAPILLARY: 250 mg/dL — AB (ref 65–99)
GLUCOSE-CAPILLARY: 259 mg/dL — AB (ref 65–99)
Glucose-Capillary: 227 mg/dL — ABNORMAL HIGH (ref 65–99)
Glucose-Capillary: 282 mg/dL — ABNORMAL HIGH (ref 65–99)
Glucose-Capillary: 387 mg/dL — ABNORMAL HIGH (ref 65–99)
Glucose-Capillary: 138 mg/dL — ABNORMAL HIGH (ref 65–99)

## 2016-01-02 LAB — PHOSPHORUS: PHOSPHORUS: 3.8 mg/dL (ref 2.5–4.6)

## 2016-01-02 MED ORDER — PREDNISONE 20 MG PO TABS
40.0000 mg | ORAL_TABLET | Freq: Every day | ORAL | Status: DC
  Administered 2016-01-03: 40 mg via ORAL
  Filled 2016-01-02: qty 2

## 2016-01-02 MED ORDER — LEVALBUTEROL HCL 0.63 MG/3ML IN NEBU
0.6300 mg | INHALATION_SOLUTION | Freq: Four times a day (QID) | RESPIRATORY_TRACT | Status: DC
  Administered 2016-01-02 – 2016-01-03 (×4): 0.63 mg via RESPIRATORY_TRACT
  Filled 2016-01-02 (×4): qty 3

## 2016-01-02 MED ORDER — METOPROLOL TARTRATE 50 MG PO TABS
75.0000 mg | ORAL_TABLET | Freq: Two times a day (BID) | ORAL | Status: DC
  Administered 2016-01-02 – 2016-01-03 (×2): 75 mg via ORAL
  Filled 2016-01-02 (×2): qty 1

## 2016-01-02 MED ORDER — IPRATROPIUM BROMIDE 0.02 % IN SOLN
0.5000 mg | Freq: Four times a day (QID) | RESPIRATORY_TRACT | Status: DC
  Administered 2016-01-02 – 2016-01-03 (×4): 0.5 mg via RESPIRATORY_TRACT
  Filled 2016-01-02 (×4): qty 2.5

## 2016-01-02 MED ORDER — GUAIFENESIN-DM 100-10 MG/5ML PO SYRP
10.0000 mL | ORAL_SOLUTION | ORAL | Status: DC | PRN
  Administered 2016-01-02 – 2016-01-03 (×2): 10 mL via ORAL
  Filled 2016-01-02 (×2): qty 10

## 2016-01-02 MED ORDER — FUROSEMIDE 10 MG/ML IJ SOLN
40.0000 mg | Freq: Once | INTRAMUSCULAR | Status: AC
  Administered 2016-01-02: 40 mg via INTRAVENOUS
  Filled 2016-01-02: qty 4

## 2016-01-02 MED ORDER — METOPROLOL TARTRATE 5 MG/5ML IV SOLN
5.0000 mg | Freq: Once | INTRAVENOUS | Status: AC
  Administered 2016-01-02: 5 mg via INTRAVENOUS
  Filled 2016-01-02: qty 5

## 2016-01-02 MED ORDER — SODIUM CHLORIDE 0.9 % IV BOLUS (SEPSIS)
500.0000 mL | Freq: Once | INTRAVENOUS | Status: AC
  Administered 2016-01-02: 500 mL via INTRAVENOUS

## 2016-01-02 NOTE — Progress Notes (Signed)
ANTICOAGULATION CONSULT NOTE - Follow Up Consult  Pharmacy Consult for Warfarin Indication: Atrial Fibrillation  Allergies  Allergen Reactions  . Avelox [Moxifloxacin Hcl In Nacl] Other (See Comments)    Cardiac arrest per pt  . Simvastatin Other (See Comments)    myalgias  . Ace Inhibitors Cough    Dry cough   . Latex Rash    Patient Measurements: Height: 5\' 6"  (167.6 cm) Weight: 207 lb 6.4 oz (94.1 kg) IBW/kg (Calculated) : 59.3  Vital Signs: Temp: 98.4 F (36.9 C) (09/21 0657) Temp Source: Oral (09/21 0657) BP: 144/89 (09/21 0657) Pulse Rate: 153 (09/21 0724)  Labs:  Recent Labs  12/31/15 1636 01/01/16 0403 01/01/16 1517 01/02/16 0537  HGB 12.1  --   --  12.3  HCT 37.4  --   --  37.9  PLT 261  --   --  256  LABPROT 30.9* 29.4*  --  38.9*  INR 2.90 2.72  --  3.86  CREATININE 1.29* 1.49* 1.48* 1.51*  TROPONINI <0.03  --   --   --     Estimated Creatinine Clearance: 43.5 mL/min (by C-G formula based on SCr of 1.51 mg/dL (H)).   Medical History: Past Medical History:  Diagnosis Date  . AICD (automatic cardioverter/defibrillator) present    Medtronic  . Anemia   . Anxiety   . Arthritis   . Asthma    has had multiple hospitalizations for this  . Atrial fibrillation (HCC)    ablation x 2 [facility], 01/2006, 2011.  on warfarin  . Cardiac arrest [facility]) jan 2012   in hospital for pneumonia when this occured- occured at the hospital  . CHF (congestive heart failure) (HCC) 2012   Echo 08/08/10 by [facility]. EF 35-45%. LV systolic function moderately reduced. Moderate global hypokinesis of LV.  RV systolic function moderately reduced. Mild MR. Trace AR.  Marland Kitchen Complication of anesthesia    difficult time waking up after anesthesia  . Depression   . Diabetes mellitus    Type 2  . Dysrhythmia    Atrial fibrillation  . GASTROESOPHAGEAL REFLUX, NO ESOPHAGITIS 06/10/2006   Qualifier: Diagnosis of  By: Abundio Miu    . GERD (gastroesophageal reflux disease)    . History of hiatal hernia   . Hyperlipidemia   . Hypertension   . Limb pain 06/04/2008   LLE, Baker's cyst in popliteal fossa, no DVT  . Non-ischemic cardiomyopathy (HCC)    echo 08/08/10 - EF 35-45% LV and RV systolic function mod reduced  . Primary localized osteoarthritis of left knee   . Primary localized osteoarthritis of right knee   . Primary localized osteoarthritis of right knee   . Sleep apnea    wears CPAP nightly  . Vocal cord disease   . Wears glasses     Assessment: 65 yoF admitted with acute asthmatic bronchitis exacerbation.  Pt on chronic warfarin for atrial fibrillation and pharmacy consulted to resume dosing inpatient.    Home dose warfarin = 7.5mg  daily except 5mg  on M/W/F per OV on 12/05/15.  Regimen confirmed with family.  INR therapeutic on admission.  Today, 01/02/2016: - INR now supratherapeutic and rising (3.86) - Hgb/Hct and Plts WNL on admission - Drug interactions: none significant, receiving steroids which may enhance warfarin's effect  Goal of Therapy:  INR 2-3 Monitor platelets by anticoagulation protocol: Yes   Plan:   Hold warfarin tonight  Daily PT/INR  Loralee Pacas, PharmD, BCPS Pager: [phone] Pager: [phone] 01/02/2016 7:39  AM     

## 2016-01-02 NOTE — Progress Notes (Signed)
eLink Physician-Brief Progress Note Patient Name: [name] DOB: [birth year] MRN: [id number]   Date of Service  01/02/2016  HPI/Events of Note  Multiple issues: 1. BP = 90/54 and patient is due for Lopressor and 2. C/o cough - wants cough medicine.   eICU Interventions  Will order: 1. 0.9 NaCl 500 mL IV over 30 minutes now prior to Lopressor dose.  2. Robitussin DM 10 mL Q 4 hours PRN cough.     Intervention Category Major Interventions: Hypotension - evaluation and management Minor Interventions: Routine modifications to care plan (e.g. PRN medications for pain, fever)  Earlee Herald Eugene 01/02/2016, 10:20 PM

## 2016-01-02 NOTE — Progress Notes (Signed)
Received call from CCMD that pts HR in 140s. Pt HR jumping around between 130s-160's. EKG obtained. VSS. Pt not in distress, but states she felt her "heart start racing." K. Schorr, NP paged. Awaiting any orders, will follow out/report to day shift RN. Pt comfortable. Will continue to monitor closely.

## 2016-01-02 NOTE — Progress Notes (Signed)
Name: Diane PainMary C Deveney MRN: [id number] DOB: 06/07/1950    ADMISSION DATE:  12/31/2015   CHIEF COMPLAINT:  Respiratory distress and wheezing  BRIEF:  65 year old with moderate persistent asthma and chronic systolic heart failure.   She was seen in the office on 9/14 for acute asthmatic bronchitis and given prednisone-she initially improved but got worse again and came back to the office today with an asthma exacerbation and increased wheezing. She was taken to her room emergently and given Depo-Medrol 120 mg IM and albuterol neb. She already taken an albuterol neb in the morning No relief with this and hence hospital admission is advised and admitted 12/31/2015   STUDIES:  Echo 2012 EF 35%  SIGNIFICANT EVENTS  Admission from office 9/19    01/01/16 - feels much better. PEr RN earlier this morning was unable to come off o2 but now off o2 x 10 min pulse ox 97%. RN also says patient much improved. Hyperglycemic on high dose steroids per RN and ssi modified and diet modified   SUBJECTIVE/OVERNIGHT/INTERVAL HX 01/02/16 - echo yesterday with ef 55% but gr2 diast dysfn and PASP 30s. Cleda DaubSpiro yesterda  - fev1 2.3L/102%. Flow volume loop norma.. At admit lopressor was help due to ae-asthma. Yesterday bisoprolol started  (all along tikosyn continued)l; this aM A Fib RVR and 5mg  IV lopressor given  -> and HR better. Noted home b2 agoniost is xopenex. Creat rising to 1.51mg %    VITAL SIGNS: Temp:  [97.5 F (36.4 C)-98.6 F (37 C)] 98.4 F (36.9 C) (09/21 0657) Pulse Rate:  [75-160] 151 (09/21 0854) Resp:  [16-24] 24 (09/21 0854) BP: (106-144)/(56-89) 122/83 (09/21 0854) SpO2:  [95 %-100 %] 97 % (09/21 0854) Weight:  [94.1 kg (207 lb 6.4 oz)] 94.1 kg (207 lb 6.4 oz) (09/21 0422)  PHYSICAL EXAMINATION: Gen. Pleasant, well-nourished, in no distress, normal affect ENT - no lesions, no post nasal drip Neck: No JVD, no thyromegaly, no carotid bruits Lungs: no use of accessory muscles, no dullness  to percussion, NO rhonchi, no crackles  Cardiovascular: Rhythm regular, heart sounds  normal, no murmurs or gallops, no peripheral edema Abdomen: soft and non-tender, no hepatosplenomegaly, BS normal. Musculoskeletal: No deformities, no cyanosis or clubbing Neuro:  alert, non focal    PULMONARY No results for input(s): PHART, PCO2ART, PO2ART, HCO3, TCO2, O2SAT in the last 168 hours.  Invalid input(s): PCO2, PO2  CBC  Recent Labs Lab 12/31/15 1636 01/02/16 0537  HGB 12.1 12.3  HCT 37.4 37.9  WBC 9.1 23.8*  PLT 261 256    COAGULATION  Recent Labs Lab 12/31/15 1636 01/01/16 0403 01/02/16 0537  INR 2.90 2.72 3.86    CARDIAC    Recent Labs Lab 12/31/15 1636  TROPONINI <0.03   No results for input(s): PROBNP in the last 168 hours.   CHEMISTRY  Recent Labs Lab 12/31/15 1636 01/01/16 0403 01/01/16 1517 01/02/16 0537  NA 136 132* 133* 133*  K 3.9 4.0 4.1 4.8  CL 100* 96* 97* 98*  CO2 25 19* 23 24  GLUCOSE 145* 382* 329* 251*  BUN 16 22* 27* 30*  CREATININE 1.29* 1.49* 1.48* 1.51*  CALCIUM 8.5* 8.6* 8.8* 9.3  MG 1.9  --   --  2.4  PHOS 2.8  --   --  3.8   Estimated Creatinine Clearance: 43.5 mL/min (by C-G formula based on SCr of 1.51 mg/dL (H)).   LIVER  Recent Labs Lab 12/31/15 1636 01/01/16 0403 01/02/16 0537  AST 29  --   --  ALT 39  --   --   ALKPHOS 83  --   --   BILITOT 0.7  --   --   PROT 7.9  --   --   ALBUMIN 3.5  --   --   INR 2.90 2.72 3.86     INFECTIOUS No results for input(s): LATICACIDVEN, PROCALCITON in the last 168 hours.   ENDOCRINE CBG (last 3)   Recent Labs  01/02/16 0016 01/02/16 0418 01/02/16 0733  GLUCAP 250* 227* 282*         IMAGING x48h  - image(s) personally visualized  -   highlighted in bold Dg Chest Port 1 View  Result Date: 12/31/2015 CLINICAL DATA:  Wheezing and coughing up yellow mucus. EXAM: PORTABLE CHEST 1 VIEW COMPARISON:  04/22/2015 FINDINGS: Again noted is a left dual chamber  cardiac ICD. Lungs are clear without airspace disease or pulmonary edema. Heart size is with normal limits and stable. The trachea is midline. No large pleural effusions. IMPRESSION: No acute findings. Electronically Signed   By: Richarda Overlie M.D.   On: 12/31/2015 16:55       ASSESSMENT / PLAN:  Acute asthma exacerbation-   - significantly better clinically , spirometyr 01/01/16 normal  Plan  - chagne IV steroids (hyperglyucemic) to po prednisne - duoneb q4h change to xopenex/atrovent q6h (due to a fib) - - opd eval for IgE and eos - to consider xolair or nucala/cinqair   Chronic systolic heart failure/atrial fibrillation-   - clnically euvolemir. Currently on tikosyn and warfarin. ECHO 01/01/16 with improved ef but has gr 2 diast dysfn and developed A Fib RVR with lopreessor hold x 48h  PLAN - restart home lopressor (dc bisoprolol) -- continue tikosyn and warfarin - - continue torsemeide - dc aldactone (rising creat) but given extra dose IV lasix due to rising creat  AKI (baseline 1-1.2mg %)  - new since 12/14/15 - hold aldactone  - lasix 40mg  IV x 1 due to gr2 diast dysfn - if creat rising 01/03/16 then call renal  Diabetes type 2, insulin requiring- on SSI resistance scale andhome dose of Lantus 15 units at bedtime + carb modified diet    DVT prophylaxis with Rx dopse warfarin      Dr. Kalman Shan, M.D., [facility].C.P Pulmonary and Critical Care Medicine Staff Physician [facility] [place] Pulmonary and Critical Care Pager: [phone], If no answer or between  15:00h - 7:00h: call [phone]  01/02/2016 9:22 AM

## 2016-01-02 NOTE — Consult Note (Signed)
   [facility] CM Inpatient Consult   01/02/2016  LAVISHA CHESBROUGH 02-Feb-1951 [id number]    Patient screened for long-term disease management services with [facility] Care Management program. Micah Flesher to bedside to discuss and offer [facility] Care Management services. Ms. Cohan is agreeable and written consent signed. Explained to Ms. Schein that she will receive post hospital transition of care calls and will be evaluated for monthly home visits. Confirmed Primary Care MD as Dr. Caroleen Hamman.  Confirmed best contact number as home [phone] or cell [phone]. Explained that [facility] Care Management will not interfere or replace services provided by home health.   Denies having issues with transportation or with obtaining medications. Discussed [facility] Care Management follow up with include CHF and DM symptom and disease management. Noted HgA1c as 9.4. Ms. Ormand endorses that she would appreciate follow up from [facility] Care Management as she has had several things going on with her medically. Her daughter, Lafonda Mosses, was at bedside during bedside encounter. Made inpatient RNCM aware that [facility] Care Management will follow.    Raiford Noble, MSN-Ed, RN,BSN [facility] Liaison [phone]

## 2016-01-03 ENCOUNTER — Ambulatory Visit (INDEPENDENT_AMBULATORY_CARE_PROVIDER_SITE_OTHER): Payer: Medicare Other

## 2016-01-03 DIAGNOSIS — I5022 Chronic systolic (congestive) heart failure: Secondary | ICD-10-CM | POA: Diagnosis not present

## 2016-01-03 DIAGNOSIS — Z95810 Presence of automatic (implantable) cardiac defibrillator: Secondary | ICD-10-CM

## 2016-01-03 LAB — CBC WITH DIFFERENTIAL/PLATELET
BASOS ABS: 0 10*3/uL (ref 0.0–0.1)
Basophils Relative: 0 %
EOS ABS: 0 10*3/uL (ref 0.0–0.7)
Eosinophils Relative: 0 %
HCT: 34 % — ABNORMAL LOW (ref 36.0–46.0)
HEMOGLOBIN: 11.1 g/dL — AB (ref 12.0–15.0)
LYMPHS ABS: 2.4 10*3/uL (ref 0.7–4.0)
Lymphocytes Relative: 16 %
MCH: 29 pg (ref 26.0–34.0)
MCHC: 32.6 g/dL (ref 30.0–36.0)
MCV: 88.8 fL (ref 78.0–100.0)
Monocytes Absolute: 1.4 10*3/uL — ABNORMAL HIGH (ref 0.1–1.0)
Monocytes Relative: 10 %
NEUTROS PCT: 74 %
Neutro Abs: 10.7 10*3/uL — ABNORMAL HIGH (ref 1.7–7.7)
Platelets: 222 10*3/uL (ref 150–400)
RBC: 3.83 MIL/uL — AB (ref 3.87–5.11)
RDW: 14.1 % (ref 11.5–15.5)
WBC: 14.5 10*3/uL — AB (ref 4.0–10.5)

## 2016-01-03 LAB — BASIC METABOLIC PANEL
ANION GAP: 5 (ref 5–15)
BUN: 35 mg/dL — AB (ref 6–20)
CHLORIDE: 101 mmol/L (ref 101–111)
CO2: 33 mmol/L — ABNORMAL HIGH (ref 22–32)
Calcium: 8.6 mg/dL — ABNORMAL LOW (ref 8.9–10.3)
Creatinine, Ser: 1.37 mg/dL — ABNORMAL HIGH (ref 0.44–1.00)
GFR, EST AFRICAN AMERICAN: 46 mL/min — AB (ref >60)
GFR, EST NON AFRICAN AMERICAN: 40 mL/min — AB (ref >60)
Glucose, Bld: 130 mg/dL — ABNORMAL HIGH (ref 65–99)
POTASSIUM: 4.8 mmol/L (ref 3.5–5.1)
SODIUM: 139 mmol/L (ref 135–145)

## 2016-01-03 LAB — PROTIME-INR
INR: 3.58
Prothrombin Time: 36.6 seconds — ABNORMAL HIGH (ref 11.4–15.2)

## 2016-01-03 LAB — MAGNESIUM: MAGNESIUM: 2.4 mg/dL (ref 1.7–2.4)

## 2016-01-03 LAB — GLUCOSE, CAPILLARY
GLUCOSE-CAPILLARY: 95 mg/dL (ref 65–99)
GLUCOSE-CAPILLARY: 115 mg/dL — AB (ref 65–99)
GLUCOSE-CAPILLARY: 155 mg/dL — AB (ref 65–99)
Glucose-Capillary: 277 mg/dL — ABNORMAL HIGH (ref 65–99)

## 2016-01-03 LAB — PHOSPHORUS: PHOSPHORUS: 4.7 mg/dL — AB (ref 2.5–4.6)

## 2016-01-03 MED ORDER — MOMETASONE FURO-FORMOTEROL FUM 200-5 MCG/ACT IN AERO: 2.0000 | INHALATION_SPRAY | Freq: Two times a day (BID) | RESPIRATORY_TRACT | 6 refills | Status: DC

## 2016-01-03 MED ORDER — LEVALBUTEROL TARTRATE 45 MCG/ACT IN AERO: 6.0000 | INHALATION_SPRAY | RESPIRATORY_TRACT | Status: DC | PRN

## 2016-01-03 MED ORDER — WARFARIN SODIUM 5 MG PO TABS: ORAL_TABLET | ORAL | 5 refills | Status: DC

## 2016-01-03 MED ORDER — INSULIN ASPART 100 UNIT/ML FLEXPEN: 4.0000 [IU] | PEN_INJECTOR | Freq: Two times a day (BID) | SUBCUTANEOUS | Status: DC

## 2016-01-03 MED ORDER — PREDNISONE 20 MG PO TABS: ORAL_TABLET | ORAL | 0 refills | Status: DC

## 2016-01-03 MED ORDER — MONTELUKAST SODIUM 10 MG PO TABS: 10.0000 mg | ORAL_TABLET | Freq: Every day | ORAL | Status: DC

## 2016-01-03 MED ORDER — MOMETASONE FURO-FORMOTEROL FUM 200-5 MCG/ACT IN AERO
2.0000 | INHALATION_SPRAY | Freq: Two times a day (BID) | RESPIRATORY_TRACT | Status: DC
  Administered 2016-01-03: 2 via RESPIRATORY_TRACT
  Filled 2016-01-03: qty 8.8

## 2016-01-03 MED ORDER — FLUTICASONE PROPIONATE 50 MCG/ACT NA SUSP
2.0000 | Freq: Every day | NASAL | Status: DC
  Administered 2016-01-03: 2 via NASAL
  Filled 2016-01-03: qty 16

## 2016-01-03 MED ORDER — ROSUVASTATIN CALCIUM 10 MG PO TABS: 10.0000 mg | ORAL_TABLET | Freq: Every evening | ORAL | Status: DC

## 2016-01-03 MED ORDER — MONTELUKAST SODIUM 10 MG PO TABS: 10.0000 mg | ORAL_TABLET | Freq: Every day | ORAL | 6 refills | Status: DC

## 2016-01-03 MED ORDER — LEVALBUTEROL HCL 0.63 MG/3ML IN NEBU: 0.6300 mg | INHALATION_SOLUTION | RESPIRATORY_TRACT | Status: DC | PRN

## 2016-01-03 NOTE — Progress Notes (Signed)
Pt bp running soft, Dr. Arsenio Loader was notified and new orders given to give bolus prior to giving 2200 metoprolol dose.

## 2016-01-03 NOTE — Progress Notes (Signed)
ANTICOAGULATION CONSULT NOTE - Follow Up Consult  Pharmacy Consult for Warfarin Indication: Atrial Fibrillation  Allergies  Allergen Reactions  . Avelox [Moxifloxacin Hcl In Nacl] Other (See Comments)    Cardiac arrest per pt  . Simvastatin Other (See Comments)    myalgias  . Ace Inhibitors Cough    Dry cough   . Latex Rash    Patient Measurements: Height: 5\' 6"  (167.6 cm) Weight: 207 lb 6.4 oz (94.1 kg) IBW/kg (Calculated) : 59.3  Vital Signs: Temp: 98.2 F (36.8 C) (09/21 2150) Temp Source: Oral (09/21 2150) BP: 112/69 (09/21 2306) Pulse Rate: 97 (09/21 2311)  Labs:  Recent Labs  12/31/15 1636 01/01/16 0403 01/01/16 1517 01/02/16 0537 01/03/16 0422  HGB 12.1  --   --  12.3 11.1*  HCT 37.4  --   --  37.9 34.0*  PLT 261  --   --  256 222  LABPROT 30.9* 29.4*  --  38.9* 36.6*  INR 2.90 2.72  --  3.86 3.58  CREATININE 1.29* 1.49* 1.48* 1.51* 1.37*  TROPONINI <0.03  --   --   --   --     Estimated Creatinine Clearance: 47.9 mL/min (by C-G formula based on SCr of 1.37 mg/dL (H)).   Medical History: Past Medical History:  Diagnosis Date  . AICD (automatic cardioverter/defibrillator) present    [employer]  . Anemia   . Anxiety   . Arthritis   . Asthma    has had multiple hospitalizations for this  . Atrial fibrillation (HCC)    ablation x 2 [facility], 01/2006, 2011.  on warfarin  . Cardiac arrest [facility](HCC) jan 2012   in hospital for pneumonia when this occured- occured at the hospital  . CHF (congestive heart failure) (HCC) 2012   Echo 08/08/10 by [facility]. EF 35-45%. LV systolic function moderately reduced. Moderate global hypokinesis of LV.  RV systolic function moderately reduced. Mild MR. Trace AR.  Marland Kitchen. Complication of anesthesia    difficult time waking up after anesthesia  . Depression   . Diabetes mellitus    Type 2  . Dysrhythmia    Atrial fibrillation  . GASTROESOPHAGEAL REFLUX, NO ESOPHAGITIS 06/10/2006   Qualifier: Diagnosis of  By: Abundio MiuMcGregor,  Barbara    . GERD (gastroesophageal reflux disease)   . History of hiatal hernia   . Hyperlipidemia   . Hypertension   . Limb pain 06/04/2008   LLE, Baker's cyst in popliteal fossa, no DVT  . Non-ischemic cardiomyopathy (HCC)    echo 08/08/10 - EF 35-45% LV and RV systolic function mod reduced  . Primary localized osteoarthritis of left knee   . Primary localized osteoarthritis of right knee   . Primary localized osteoarthritis of right knee   . Sleep apnea    wears CPAP nightly  . Vocal cord disease   . Wears glasses     Assessment: 89 yoF admitted with acute asthmatic bronchitis exacerbation.  Pt on chronic warfarin for atrial fibrillation and pharmacy consulted to resume dosing inpatient.    Home dose warfarin = 7.5mg  daily except 5mg  on M/W/F per OV on 12/05/15.  Regimen confirmed with family.  INR therapeutic on admission.  Today, 01/03/2016: - INR remains supratherapeutic (3.58), most recent warfarin dose 5mg  on 9/20. - Hgb/Hct WNL on admission, slight decrease overnight; Plts remain WNL - Drug interactions: none significant, receiving steroids which may enhance warfarin's effect - Heart health diet ordered, 75% breakfast charted 9/21  Goal of Therapy:  INR  2-3 Monitor platelets by anticoagulation protocol: Yes   Plan:   Hold warfarin again tonight  Daily PT/INR  Loralee Pacas, PharmD, BCPS Pager: [phone] 01/03/2016 7:31 AM

## 2016-01-03 NOTE — Progress Notes (Signed)
No ICM remote transmission received for 01/03/2016 due to hospitalization and next ICM transmission scheduled for 01/22/2016.

## 2016-01-03 NOTE — Progress Notes (Signed)
Name: [name] MRN: [id number] DOB: 16-Feb-1951    ADMISSION DATE:  12/31/2015   CHIEF COMPLAINT:  Respiratory distress and wheezing  BRIEF:  65 year old with moderate persistent asthma and chronic systolic heart failure.   She was seen in the office on 9/14 for acute asthmatic bronchitis and given prednisone-she initially improved but got worse again and came back to the office today with an asthma exacerbation and increased wheezing. She was taken to her room emergently and given Depo-Medrol 120 mg IM and albuterol neb. She already taken an albuterol neb in the morning No relief with this and hence hospital admission is advised and admitted 12/31/2015   STUDIES:  Echo 2012 EF 35%  SIGNIFICANT EVENTS  Admission from office 9/19    01/01/16 - feels much better. PEr RN earlier this morning was unable to come off o2 but now off o2 x 10 min pulse ox 97%. RN also says patient much improved. Hyperglycemic on high dose steroids per RN and ssi modified and diet modified  01/02/16 - echo yesterday with ef 55% but gr2 diast dysfn and PASP 30s. Cleda Daub yesterda  - fev1 2.3L/102%. Flow volume loop norma.. At admit lopressor was help due to ae-asthma. Yesterday bisoprolol started  (all along tikosyn continued)l; this aM A Fib RVR and 5mg  IV lopressor given  -> and HR better. Noted home b2 agoniost is xopenex. Creat rising to 1.51mg %   SUBJECTIVE/OVERNIGHT/INTERVAL HX 01/03/16: creat improved to 1.3mg % after extra IV lasix yesterday but patient did get soft on bp while on scheduled home lopressor and folowing IV lasix and got some fluids. Feels well. Currently stable vitals. Used CPAP QHS - baseline    VITAL SIGNS: Temp:  [98.1 F (36.7 C)-98.2 F (36.8 C)] 98.2 F (36.8 C) (09/21 2150) Pulse Rate:  [61-97] 97 (09/21 2311) Resp:  [18] 18 (09/21 2150) BP: (93-119)/(46-69) 112/69 (09/21 2306) SpO2:  [96 %-99 %] 98 % (09/22 0800)  PHYSICAL EXAMINATION: Gen. Pleasant, well-nourished, in no  distress, normal affect ENT - no lesions, no post nasal drip Neck: No JVD, no thyromegaly, no carotid bruits Lungs: no use of accessory muscles, no dullness to percussion, NO rhonchi, no crackles  Cardiovascular: Rhythm regular, heart sounds  normal, no murmurs or gallops, no peripheral edema Abdomen: soft and non-tender, no hepatosplenomegaly, BS normal. Musculoskeletal: No deformities, no cyanosis or clubbing Neuro:  alert, non focal    PULMONARY No results for input(s): PHART, PCO2ART, PO2ART, HCO3, TCO2, O2SAT in the last 168 hours.  Invalid input(s): PCO2, PO2  CBC  Recent Labs Lab 12/31/15 1636 01/02/16 0537 01/03/16 0422  HGB 12.1 12.3 11.1*  HCT 37.4 37.9 34.0*  WBC 9.1 23.8* 14.5*  PLT 261 256 222    COAGULATION  Recent Labs Lab 12/31/15 1636 01/01/16 0403 01/02/16 0537 01/03/16 0422  INR 2.90 2.72 3.86 3.58    CARDIAC    Recent Labs Lab 12/31/15 1636  TROPONINI <0.03   No results for input(s): PROBNP in the last 168 hours.   CHEMISTRY  Recent Labs Lab 12/31/15 1636 01/01/16 0403 01/01/16 1517 01/02/16 0537 01/03/16 0422  NA 136 132* 133* 133* 139  K 3.9 4.0 4.1 4.8 4.8  CL 100* 96* 97* 98* 101  CO2 25 19* 23 24 33*  GLUCOSE 145* 382* 329* 251* 130*  BUN 16 22* 27* 30* 35*  CREATININE 1.29* 1.49* 1.48* 1.51* 1.37*  CALCIUM 8.5* 8.6* 8.8* 9.3 8.6*  MG 1.9  --   --  2.4 2.4  PHOS 2.8  --   --  3.8 4.7*   Estimated Creatinine Clearance: 47.9 mL/min (by C-G formula based on SCr of 1.37 mg/dL (H)).   LIVER  Recent Labs Lab 12/31/15 1636 01/01/16 0403 01/02/16 0537 01/03/16 0422  AST 29  --   --   --   ALT 39  --   --   --   ALKPHOS 83  --   --   --   BILITOT 0.7  --   --   --   PROT 7.9  --   --   --   ALBUMIN 3.5  --   --   --   INR 2.90 2.72 3.86 3.58     INFECTIOUS No results for input(s): LATICACIDVEN, PROCALCITON in the last 168 hours.   ENDOCRINE CBG (last 3)   Recent Labs  01/03/16 0111 01/03/16 0442  01/03/16 0731  GLUCAP 95 115* 155*         IMAGING x48h  - image(s) personally visualized  -   highlighted in bold No results found.     ASSESSMENT / PLAN:  Acute asthma exacerbation-   - significantly better clinically , spirometyr 01/01/16 normal  Plan  - po prednisne since 01/02/16 - complete 12 days since 01/02/16 - chagne xopenex/atrovent neb to dulera high dose (at home was only on ICS pulmicort - she now needs dulera) - start singulair (dc hyocdan and anti histamine)   - restart nasal steroids - xopenex hfa prn - check FenO at fu in pccm - - opd eval for IgE and eos when off prednisone - to consider xolair or nucala/cinqair   Chronic systolic heart failure/atrial fibrillation-   - .  Marland Kitchen. ECHO 01/01/16 with improved ef but has gr 2 diast dysfn and developed A Fib RVR 01/02/16 AM  with lopreessor hold x 48h. Back on home lopressor since 01/03/16 and s/p extra IV lasix x 1 01/02/16 and doing well 01/03/16 with clinical euvolemia and good HR control  PLAN - home lopressor (dc bisoprolol) -- continue tikosyn and warfarin home regimen - - continue torsemide home regiemm - dc home aldactone at dc - cards to decide this at fu - hold home losartan at dc - cards to decide this at fu (but avoid in asthma - theoretical risk with ARB)  AKI (baseline 1-1.2mg %)  - new since 01/01/16 -> - hold aldactone but creat increaed to 1.5mg % -> - lasix 40mg  IV x 1 due to gr2 diast dysfn on 01/02/16 -> improved creat 01/03/16 Plan  0- recheck bmet in 3-5 days at opd fu with pccm  Baseline OSA  - stable   - continue home CPAP   Diabetes type 2, insulin requiring- on SSI resistance scale andhome dose of Lantus 15 units at bedtime + carb modified diet      DVT prophylaxis with Rx dopse warfarin    FU - needs BMET check at first fu 01/08/16 Future Appointments Date Time Provider Department Center  01/08/2016 10:30 AM Jeanella CrazeBrandi L Ollis, NP [facility] None  01/13/2016 9:15 AM Roslynn AmbleJennings E Nestor,  MD [facility] None  01/15/2016 9:00 AM [facility]  01/27/2016 9:00 AM Jessica PriestLinda D Spagnola, RN [facility] NDM  01/27/2016 10:15 AM Araceli Bouchealeigh N Rumley, DO [facility]  02/18/2016 8:45 AM Thurmon FairMihai Croitoru, MD [facility]  02/18/2016 2:00 PM Helane GuntherGregory Mayer, DPM [facility]   > 35 min dc planning    Dr. Kalman ShanMurali Hammond Obeirne, M.D., [facility] Pulmonary and Critical Care Medicine  Staff Physician [facility] Pager: [phone], If no answer or between  15:00h - 7:00h: call [phone]  01/03/2016 9:41 AM

## 2016-01-03 NOTE — Discharge Summary (Signed)
Physician Discharge Summary  Patient ID: [name] MRN: [id number] DOB/AGE: January 01, 1951 65 y.o.  Admit date: 12/31/2015 Discharge date: 01/03/2016    Discharge Diagnoses:  Acute Asthma Exacerbation  OSA  Chronic Systolic Heart Failure  Atrial Fibrillation  Chronic Anticoagulation  HTN HLD Acute Kidney Injury  DM II                                                                        DISCHARGE PLAN BY DIAGNOSIS       Acute Asthma Exacerbation  OSA   Discharge Plan: Begin Dulera  D/C Budesonide  PRN Xopenex  Prednisone taper over 2 weeks Nasal steroid  D/C hycodan & anti-histamine  Outpatient evaluation once off steroids for IgE and EOS when off prednisone > consider Xolair or Nucala / Cinqair Continue CPAP therapy as previously prescribed Outpatient evaluation of FenO at time of follow up    Chronic Systolic Heart Failure - ECHO 01/01/16 with improved EF, grade II diastolic dysfunction Atrial Fibrillation  Chronic Anticoagulation HTN HLD  Discharge Plan: Continue home lopressor, tikosyn & torsemide  Hold home aldactone, losartan given soft BP  No coumadin on 9/22.  Resume 9/23 with 2.5 mg until seen at the coumadin clinic as scheduled  Follow up with Cardiology regarding BP control / regimen as above   Acute Kidney Injury - new 9/20, aldactone on hold.  Improved at time of d/c   Discharge Plan: Follow up BMP at Pulmonary Office 9/27   DM II  Discharge Plan: Resume home insulin regimen as previously prescribed                  DISCHARGE SUMMARY   [name] is a 65 y.o. y/o female with a PMH of HTN, HLD, AF on coumadin, chronic systolic heart failure, OSA on CPAP and moderate persistent asthma who was seen in the office on 9/14 for acute asthmatic bronchitis and given prednisone.  She initially improved but worsened again prompting office evaluation (9/19).  She was found to have an asthma exacerbation with increased wheezing. In the office, she  was evaluated emergently given Depo-Medrol 120 mg IM and albuterol nebulization without improvement. Given no relief with therapy, she was directly admitted to [facility] for further evaluation.  She was treated with IV steroids, nebulized bronchodilators for acute asthma exacerbation.  Home antihypertensive regimen was initially held due to soft blood pressures. She later developed AF with RVR off lopressor.  This was restarted with improvement.  Repeat ECHO evaluation demonstrated a  LV nml size, LVEF 50-55%, mild hypokinesis of the basal-inferior myocardium, pseudo-normal left ventricular filling pattern, grade II diastolic dysfunction, trivial AVR, PA peak pressure 39.  Home tikosyn was continued.  Coumadin was managed per pharmacy.  She was found to be supratherapeutic during the admission and required adjustment to coumadin dosing.   Sr Creatinine also briefly rose to a peak of 1.51.  She was given lasix to achieve clinical euvolemia with improvement in sr cr to 1.37.  Asthma regimen was adjusted during hospitalization as well as BP regimen due to hypotension / soft normal pressures.  She made slow clinical improvement. The patient was medically cleared for discharge 9/21 with plans as above.  STUDIES:  ECHO 01/01/16 >> LV nml size, LVEF 50-55%, mild hypokinesis of the basal-inferior myocardium, pseudo-normal left ventricular filling pattern, grade II diastolic dysfunction, trivial AVR, PA peak pressure 39   SIGNIFICANT EVENTS  9/19  Admit with asthma exacerbation 9/20  Rise in Sr Cr, meds adjusted, feels better, off O2, hyperglycemia on steroids 9/21  Spirometry fev1 2.3L/102%. Flow volume loop normal. Developed AFRVR off lopressor (restarted).   9/22  Sr Cr improved to 1.3, BP soft but normal     Discharge Exam: Gen. Pleasant, well-nourished, in no distress, normal affect ENT - no lesions, no post nasal drip Neck: No JVD, no thyromegaly, no carotid bruits Lungs:  no use of accessory muscles, no dullness to percussion, no rhonchi / wheezing / crackles  Cardiovascular: Rhythm regular, heart sounds  normal, no murmurs or gallops, no peripheral edema Abdomen: soft and non-tender, no hepatosplenomegaly, BS normal. Musculoskeletal: No deformities, no cyanosis or clubbing Neuro:  alert, non focal  Vitals:   01/02/16 2311 01/03/16 0215 01/03/16 0800 01/03/16 1047  BP:    127/82  Pulse: 97   72  Resp:      Temp:      TempSrc:      SpO2:  99% 98%   Weight:      Height:         Discharge Labs  BMET  Recent Labs Lab 12/31/15 1636 01/01/16 0403 01/01/16 1517 01/02/16 0537 01/03/16 0422  NA 136 132* 133* 133* 139  K 3.9 4.0 4.1 4.8 4.8  CL 100* 96* 97* 98* 101  CO2 25 19* 23 24 33*  GLUCOSE 145* 382* 329* 251* 130*  BUN 16 22* 27* 30* 35*  CREATININE 1.29* 1.49* 1.48* 1.51* 1.37*  CALCIUM 8.5* 8.6* 8.8* 9.3 8.6*  MG 1.9  --   --  2.4 2.4  PHOS 2.8  --   --  3.8 4.7*    CBC  Recent Labs Lab 12/31/15 1636 01/02/16 0537 01/03/16 0422  HGB 12.1 12.3 11.1*  HCT 37.4 37.9 34.0*  WBC 9.1 23.8* 14.5*  PLT 261 256 222    Anti-Coagulation  Recent Labs Lab 12/31/15 1636 01/01/16 0403 01/02/16 0537 01/03/16 0422  INR 2.90 2.72 3.86 3.58    Discharge Instructions    (HEART FAILURE PATIENTS) Call MD:  Anytime you have any of the following symptoms: 1) 3 pound weight gain in 24 hours or 5 pounds in 1 week 2) shortness of breath, with or without a dry hacking cough 3) swelling in the hands, feet or stomach 4) if you have to sleep on extra pillows at night in order to breathe.    Complete by:  As directed    AMB Referral to [facility] Management    Complete by:  As directed    Please assign to [place] for transition of care and for DM, CHF disease and symptom management. Written consent obtained. Currently at [facility]. Please contact with questions. Marthenia Rolling, [place], [facility]  Liaison-[phone]   Reason for consult:  Pleaase assign to [facility] RNCM   Diagnoses of:   Heart Failure Diabetes     Expected date of contact:  1-3 days (reserved for hospital discharges)   Call MD for:  difficulty breathing, headache or visual disturbances    Complete by:  As directed    Call MD for:  extreme fatigue    Complete by:  As directed    Call MD for:  hives  Complete by:  As directed    Call MD for:  persistant dizziness or light-headedness    Complete by:  As directed    Call MD for:  persistant nausea and vomiting    Complete by:  As directed    Call MD for:  redness, tenderness, or signs of infection (pain, swelling, redness, odor or green/yellow discharge around incision site)    Complete by:  As directed    Call MD for:  severe uncontrolled pain    Complete by:  As directed    Call MD for:  temperature >100.4    Complete by:  As directed    Diet - low sodium heart healthy    Complete by:  As directed    Increase activity slowly    Complete by:  As directed        Follow-up Information    OLLIS,BRANDI, NP Follow up on 01/08/2016.   Specialty:  Pulmonary Disease Why:  Appt at 10:30 AM Contact information: [place] [id number] [phone]        [place] [facility] Follow up on 01/06/2016.   Specialty:  Cardiology Why:  Appt at 11:15 am - at the [facility] information: [address], [place] [phone]       Sanda Klein, MD. Schedule an appointment as soon as possible for a visit today.   Specialty:  Cardiology Why:  Call to make an appointment to be seen for blood pressure evaluation post discharge Contact information: [address] [place] [address] [phone]              Medication List    STOP taking these medications   budesonide 180 MCG/ACT inhaler Commonly known as:  PULMICORT FLEXHALER    HYDROcodone-homatropine 5-1.5 MG/5ML syrup Commonly known as:  HYCODAN   losartan 50 MG tablet Commonly known as:  COZAAR   RA LORATADINE 10 MG tablet Generic drug:  loratadine   spironolactone 25 MG tablet Commonly known as:  ALDACTONE     TAKE these medications   B-D ULTRAFINE III SHORT PEN 31G X 8 MM Misc Generic drug:  Insulin Pen Needle use as directed   Insulin Pen Needle 31G X 8 MM Misc Commonly known as:  B-D ULTRAFINE III SHORT PEN Use as directed   buPROPion 150 MG 24 hr tablet Commonly known as:  WELLBUTRIN XL take 1 tablet by mouth once daily   chlorpheniramine 4 MG tablet Commonly known as:  CHLOR-TRIMETON 75m at night   dofetilide 250 MCG capsule Commonly known as:  TIKOSYN Take 250 mcg by mouth 2 (two) times daily. Reported on 09/26/2015   fluticasone 50 MCG/ACT nasal spray Commonly known as:  FLONASE instill 1 spray into each nostril twice a day   insulin aspart 100 UNIT/ML FlexPen Commonly known as:  NOVOLOG FLEXPEN Inject 4-16 Units into the skin 2 (two) times daily. Inject twice daily based on sliding scale, BG 70-130 = 0 units, 131-180 = 4 units, 181-240 = 8 units, 241-300 = 10 units, 301-350 = 12 units, 351-400 = 16units What changed:  how much to take  how to take this  when to take this  additional instructions   Insulin Glargine 100 UNIT/ML Solostar Pen Commonly known as:  LANTUS Inject 50 Units into the skin at bedtime. What changed:  how much to take  when to take this   levalbuterol 0.63 MG/3ML nebulizer solution  Commonly known as:  XOPENEX One vial in nebulizer four times daily as needed dx 493.00   metoprolol 50 MG tablet Commonly known as:  LOPRESSOR Take 1.5 tablets (75 mg total) by mouth 2 (two) times daily.   mometasone-formoterol 200-5 MCG/ACT Aero Commonly known as:  DULERA Inhale 2 puffs into the lungs 2 (two) times daily.   montelukast 10 MG tablet Commonly known as:  SINGULAIR Take 1 tablet (10 mg total)  by mouth at bedtime.   NEXIUM 40 MG capsule Generic drug:  esomeprazole take 1 capsule by mouth once daily   polyethylene glycol powder powder Commonly known as:  GLYCOLAX/MIRALAX take 17GM (DISSOLVED IN WATER) by mouth once daily PRN, takes three times a week   predniSONE 20 MG tablet Commonly known as:  DELTASONE 2 tabs daily for 3 days, then 1.5 tab daily for 3 days, then 1 tab daily for 3 days, then 0.5 tab daily for 3 days and stop What changed:  medication strength  additional instructions   rosuvastatin 10 MG tablet Commonly known as:  CRESTOR Take 1 tablet (10 mg total) by mouth every evening. What changed:  See the new instructions.   torsemide 10 MG tablet Commonly known as:  DEMADEX Take 2 tablets by mouth once daily, may take additional 2 tablets as directed   warfarin 5 MG tablet Commonly known as:  COUMADIN No coumadin on 9/22 Take 2.5 mg starting on 9/23 daily until seen in the coumadin clinic. What changed:  See the new instructions.   XOPENEX HFA 45 MCG/ACT inhaler Generic drug:  levalbuterol INHALE 1 TO 2 PUFFS BY MOUTH INTO THE LUNGS EVERY 4 HOURS IF NEEDED FOR WHEEZING         Disposition: Home.  No new home health needs identified at time of discharge.   Discharged Condition: [name] has met maximum benefit of inpatient care and is medically stable and cleared for discharge.  Patient is pending follow up as above.      Time spent on disposition:  Greater than 35 minutes.   Signed: Noe Gens, NP-C [place] [facility] Pgr: [phone] Office: [phone]

## 2016-01-06 ENCOUNTER — Ambulatory Visit (INDEPENDENT_AMBULATORY_CARE_PROVIDER_SITE_OTHER): Payer: Medicare Other | Admitting: Pharmacist

## 2016-01-06 DIAGNOSIS — Z7901 Long term (current) use of anticoagulants: Secondary | ICD-10-CM | POA: Diagnosis not present

## 2016-01-06 DIAGNOSIS — I4891 Unspecified atrial fibrillation: Secondary | ICD-10-CM

## 2016-01-06 LAB — POCT INR: INR: 1.1

## 2016-01-06 NOTE — Progress Notes (Signed)
Maybe due to steroids? Thank you

## 2016-01-06 NOTE — Progress Notes (Signed)
EPIC Encounter for ICM Monitoring  Patient Name: [name] is a 65 y.o. female Date: 01/06/2016 Primary Care Physican: Garry Heater, DO Primary Cardiologist:Croitoru Electrophysiologist: Croitoru Dry Weight: 203 lb       Clinical Status (03-Dec-2015 to 03-Jan-2016) Treated VT/VF 0 episodes  V. Pacing 0.8%  Lower Rate 60 bpm AT/AF 44 episodes Atrial Pacing 94.2%  Upper Rate 130 bpm Time in AT/AF 0.4 hr/day (1.5%)  Battery OK  Heart Failure questions reviewed, pt asymptomatic for fluid symptoms now.  She did have an increase in weight to 209 lbs prior to hospitalization and now at 203 lbs.   She was discharged from hospital on 01/03/2016 because of asthma exacerbation.    Thoracic impedance abnormal suggesting fluid accumulation.  Recommendations:  Increase Torsemide 10 mg to 2 tablets bid x 2 days as prescribed and return to prescribed dosage of 2 tablets every morning after 2nd day.    Follow-up plan: ICM clinic phone appointment on 01/08/2016 for repeat fluid levels.  Copy of ICM check sent to primary cardiologist and device physician.   ICM trend: 01/03/2016         AT/AF    [name], RN 01/06/2016 8:05 AM

## 2016-01-07 ENCOUNTER — Other Ambulatory Visit: Payer: Self-pay | Admitting: *Deleted

## 2016-01-07 NOTE — Patient Outreach (Signed)
[facility]) Care Management  01/07/2016  MIANICOLE PENT 1950-11-10 [id number]   Attempted outreach call pt not available will schedule another follow up call for this week for transition of care.  Elliot Cousin, RN Care Management Coordinator [facility] Main Office [phone]

## 2016-01-07 NOTE — Patient Outreach (Signed)
[facility]) Care Management  01/07/2016  [name] [birth year] [id number]   Pt returned the call and RN introduced the [facility] program and purpose for today's call. After credential verified RN inquired if this was a good time to talk and pt indicated it was not and stating her asthma was "acting up". States she has an appointment tomorrow and requested RN to call back on Thursday. RN inquired further and offered pt a home visit if she felt further assessment was needed. Pt grateful and declined the home visit but to call back if she feels she needs further assistance from this nurse. RN scheduled another call on Thursday this week to completed the transition of care and inquire further on possible [facility] services for this pt. Pt again very grateful and expressing her gratitude.  RN will follow up on Thursday as requested.  Elliot Cousin, RN Care Management Coordinator [facility] Main Office [phone]

## 2016-01-08 ENCOUNTER — Encounter: Payer: Self-pay | Admitting: Pulmonary Disease

## 2016-01-08 ENCOUNTER — Ambulatory Visit (INDEPENDENT_AMBULATORY_CARE_PROVIDER_SITE_OTHER): Payer: Medicare Other | Admitting: Pulmonary Disease

## 2016-01-08 ENCOUNTER — Other Ambulatory Visit (INDEPENDENT_AMBULATORY_CARE_PROVIDER_SITE_OTHER): Payer: Medicare Other

## 2016-01-08 ENCOUNTER — Telehealth: Payer: Self-pay | Admitting: Nutrition

## 2016-01-08 ENCOUNTER — Ambulatory Visit (INDEPENDENT_AMBULATORY_CARE_PROVIDER_SITE_OTHER): Payer: Medicare Other

## 2016-01-08 ENCOUNTER — Telehealth: Payer: Self-pay

## 2016-01-08 VITALS — BP 110/80 | HR 64 | Ht 66.0 in | Wt 201.0 lb

## 2016-01-08 DIAGNOSIS — J4540 Moderate persistent asthma, uncomplicated: Secondary | ICD-10-CM

## 2016-01-08 DIAGNOSIS — N179 Acute kidney failure, unspecified: Secondary | ICD-10-CM

## 2016-01-08 DIAGNOSIS — Z95810 Presence of automatic (implantable) cardiac defibrillator: Secondary | ICD-10-CM

## 2016-01-08 DIAGNOSIS — I5022 Chronic systolic (congestive) heart failure: Secondary | ICD-10-CM

## 2016-01-08 LAB — BASIC METABOLIC PANEL
BUN: 26 mg/dL — AB (ref 6–23)
CALCIUM: 8.6 mg/dL (ref 8.4–10.5)
CO2: 35 meq/L — AB (ref 19–32)
CREATININE: 1.3 mg/dL — AB (ref 0.40–1.20)
Chloride: 95 mEq/L — ABNORMAL LOW (ref 96–112)
GFR: 52.93 mL/min — ABNORMAL LOW (ref >60.00)
GLUCOSE: 220 mg/dL — AB (ref 70–99)
Potassium: 4.4 mEq/L (ref 3.5–5.1)
Sodium: 136 mEq/L (ref 135–145)

## 2016-01-08 LAB — CBC WITH DIFFERENTIAL/PLATELET
BASOS ABS: 0 10*3/uL (ref 0.0–0.1)
Basophils Relative: 0.1 % (ref 0.0–3.0)
EOS ABS: 0 10*3/uL (ref 0.0–0.7)
Eosinophils Relative: 0 % (ref 0.0–5.0)
HCT: 40.2 % (ref 36.0–46.0)
Hemoglobin: 13.3 g/dL (ref 12.0–15.0)
LYMPHS ABS: 1.3 10*3/uL (ref 0.7–4.0)
LYMPHS PCT: 10.6 % — AB (ref 12.0–46.0)
MCHC: 33.2 g/dL (ref 30.0–36.0)
MCV: 87.5 fl (ref 78.0–100.0)
MONO ABS: 0.6 10*3/uL (ref 0.1–1.0)
Monocytes Relative: 4.6 % (ref 3.0–12.0)
NEUTROS ABS: 10.7 10*3/uL — AB (ref 1.4–7.7)
NEUTROS PCT: 84.7 % — AB (ref 43.0–77.0)
PLATELETS: 220 10*3/uL (ref 150.0–400.0)
RBC: 4.59 Mil/uL (ref 3.87–5.11)
RDW: 13.7 % (ref 11.5–15.5)
WBC: 12.7 10*3/uL — ABNORMAL HIGH (ref 4.0–10.5)

## 2016-01-08 MED ORDER — TRAMADOL HCL 50 MG PO TABS: 50.0000 mg | ORAL_TABLET | Freq: Four times a day (QID) | ORAL | 0 refills | Status: DC | PRN

## 2016-01-08 NOTE — Patient Instructions (Addendum)
1.  You need to try to suppress your cough to allow your larynx (voice box) to heal.  For three days don't talk, laugh, sing, or clear your throat. Do everything you can to suppress the cough during this time. Use hard candies (sugarless Jolly Ranchers) or non-mint or non-menthol containing cough drops during this time to soothe your throat.  Use a cough suppressant (Ultram, I have prescribed you) around the clock during this time.  After three days, gradually increase the use of your voice and back off on the cough suppressants.  2.  Follow up with Dr. Jamison Neighbor as scheduled  3.  Return / call if new or worsening symptoms  4.  We will call you with your lab results  5.  You can resume your daily claritin after today's lab draw

## 2016-01-08 NOTE — Progress Notes (Signed)
[name]   Chief Complaint  Patient presents with  . Hospitalization Follow-up    Discharged on 01/03/16. Reports SOB, chest tightness, wheezing and coughing.     Current Outpatient Prescriptions on File Prior to Visit  Medication Sig  . B-D ULTRAFINE III SHORT PEN 31G X 8 MM MISC use as directed  . buPROPion (WELLBUTRIN XL) 150 MG 24 hr tablet take 1 tablet by mouth once daily  . chlorpheniramine (CHLOR-TRIMETON) 4 MG tablet 8mg  at night  . dofetilide (TIKOSYN) 250 MCG capsule Take 250 mcg by mouth 2 (two) times daily. Reported on 09/26/2015  . fluticasone (FLONASE) 50 MCG/ACT nasal spray instill 1 spray into each nostril twice a day  . insulin aspart (NOVOLOG FLEXPEN) 100 UNIT/ML FlexPen Inject 4-16 Units into the skin 2 (two) times daily. Inject twice daily based on sliding scale, BG 70-130 = 0 units, 131-180 = 4 units, 181-240 = 8 units, 241-300 = 10 units, 301-350 = 12 units, 351-400 = 16units  . Insulin Glargine (LANTUS) 100 UNIT/ML Solostar Pen Inject 50 Units into the skin at bedtime. (Patient taking differently: Inject 55 Units into the skin every morning. )  . Insulin Pen Needle (B-D ULTRAFINE III SHORT PEN) 31G X 8 MM MISC Use as directed  . levalbuterol (XOPENEX) 0.63 MG/3ML nebulizer solution One vial in nebulizer four times daily as needed dx 493.00  . metoprolol (LOPRESSOR) 50 MG tablet Take 1.5 tablets (75 mg total) by mouth 2 (two) times daily.  . mometasone-formoterol (DULERA) 200-5 MCG/ACT AERO Inhale 2 puffs into the lungs 2 (two) times daily.  . montelukast (SINGULAIR) 10 MG tablet Take 1 tablet (10 mg total) by mouth at bedtime.  Marland Kitchen. NEXIUM 40 MG capsule take 1 capsule by mouth once daily  . polyethylene glycol powder (GLYCOLAX/MIRALAX) powder take 17GM (DISSOLVED IN WATER) by mouth once daily PRN, takes three times a week  . predniSONE (DELTASONE) 20 MG tablet 2 tabs daily for 3 days, then 1.5 tab daily for 3 days, then 1 tab daily for 3 days, then 0.5 tab daily  for 3 days and stop  . rosuvastatin (CRESTOR) 10 MG tablet Take 1 tablet (10 mg total) by mouth every evening.  . torsemide (DEMADEX) 10 MG tablet Take 2 tablets by mouth once daily, may take additional 2 tablets as directed  . warfarin (COUMADIN) 5 MG tablet No coumadin on 9/22 Take 2.5 mg starting on 9/23 daily until seen in the coumadin clinic.  Marland Kitchen. XOPENEX HFA 45 MCG/ACT inhaler INHALE 1 TO 2 PUFFS BY MOUTH INTO THE LUNGS EVERY 4 HOURS IF NEEDED FOR WHEEZING   No current facility-administered medications on file prior to visit.      Studies: ECHO 01/01/16 >> LV nml size, LVEF 50-55%, mild hypokinesis of the basal-inferior myocardium, pseudo-normal left ventricular filling pattern, grade II diastolic dysfunction, trivial AVR, PA peak pressure 39  9/21  Spirometry fev1 2.3L/102%. Flow volume loop normal.  Past Medical Hx:  has a past medical history of AICD (automatic cardioverter/defibrillator) present; Anemia; Anxiety; Arthritis; Asthma; Atrial fibrillation (HCC); Cardiac arrest [facility](HCC) (jan 2012); CHF (congestive heart failure) (HCC) (2012); Complication of anesthesia; Depression; Diabetes mellitus; Dysrhythmia; GASTROESOPHAGEAL REFLUX, NO ESOPHAGITIS (06/10/2006); GERD (gastroesophageal reflux disease); History of hiatal hernia; Hyperlipidemia; Hypertension; Limb pain (06/04/2008); Non-ischemic cardiomyopathy (HCC); Primary localized osteoarthritis of left knee; Primary localized osteoarthritis of right knee; Primary localized osteoarthritis of right knee; Sleep apnea; Vocal cord disease; and Wears glasses.   Past Surgical hx, Allergies, Family hx, Social hx  all reviewed.  Vital Signs BP 110/80 (BP Location: Left Arm, Cuff Size: Normal)   Pulse 64   Ht 5\' 6"  (1.676 m)   Wt 201 lb (91.2 kg)   LMP 07/26/2011   SpO2 100%   BMI 32.44 kg/m   History of Present Illness [name] is a 65 y.o. female with a history of HTN, HLD, AF on coumadin, chronic systolic heart failure, OSA on CPAP  and moderate persistent asthma (followed by Dr. Jamison Neighbor) who presented to the pulmonary office 9.27 for hospital follow up.    The patient was recently admitted 9/19-9/22 for an acute asthma exacerbation.  He was treated for asthmatic bronchitis with steroids, oxygen and nebulized bronchodilators.  There is also felt to be component of volume overload (beta blocker initially held on admission, patient developed A. fib with RVR) and she was diuresed. Hospital course complicated by elevated serum creatinine.  Echo as above. She was instructed to stop taking Hycodan and antihistamine at discharge (in anticipation of lab work).  The patient reports her breathing is improved. She denies chest tightness. However, she has noted voice hoarseness and increased dry cough. The cough is nonproductive. She denies fevers, chills, chest pain.   Physical Exam  General - well developed adult F in no acute distress ENT - No sinus tenderness, no oral exudate, no LAN, no upper airway wheeze Cardiac - s1s2 regular, no murmur Chest - even/non-labored, lungs bilaterally clear with good air movement. No wheeze/rales Back - No focal tenderness Abd - Soft, non-tender Ext - No edema Neuro - Normal strength Skin - No rashes Psych - normal mood, and behavior   Assessment/Plan  Acute asthma exacerbation - recent hospitalization for the same. Symptoms improved but she has persistent dry cough.  Nonproductive cough  Plan: Cough suppression with Ultram, patient instructed to use on a scheduled basis for the first 48 hours Voice rest Patient instructed to use hard candies such as sugarless [name] for cough Assess CBC to review eosinophils Pending lab work consider Xolair or equivalent Follow-up with Dr. Jamison Neighbor as scheduled on 10/2   Acute kidney injury - resolving at discharge  Plan: Follow-up BMP Office will call patient with results  CHF   Plan: Follow up with Cardiology as arranged  Pt recently  instructed to take additional lasix per [facility] with rise in daily weights.  May be associated with prednisone use   Patient Instructions  1.  You need to try to suppress your cough to allow your larynx (voice box) to heal.  For three days don't talk, laugh, sing, or clear your throat. Do everything you can to suppress the cough during this time. Use hard candies (sugarless Jolly [name]) or non-mint or non-menthol containing cough drops during this time to soothe your throat.  Use a cough suppressant (Ultram, I have prescribed you) around the clock during this time.  After three days, gradually increase the use of your voice and back off on the cough suppressants.  2.  Follow up with Dr. Jamison Neighbor as scheduled  3.  Return / call if new or worsening symptoms  4.  We will call you with your lab results  5.  You can resume your daily claritin after today's lab draw     Canary Brim, NP-C [facility] Pulmonary [facility] Critical Care Office  [phone] 01/08/2016, 10:51 AM

## 2016-01-08 NOTE — Progress Notes (Signed)
EPIC Encounter for ICM Monitoring  Patient Name: [name] is a 65 y.o. female Date: 01/08/2016 Primary Care Physican: Garry Heater, DO Primary Cardiologist:Croitoru Electrophysiologist: Croitoru Dry Weight: unknown Clinical Status (03-Jan-2016 to 08-Jan-2016) Treated VT/VF 0 episodes  V. Pacing 2.1%  Lower Rate 60 bpm AT/AF 5 episodes  Atrial Pacing 94.9%  Upper Rate 130 bpm Time in AT/AF 0.1 hr/day (0.5%)  Battery OK      Attempted ICM call and unable to reach. Left detailed meassge. Transmission reviewed.   Thoracic impedance returned to normal since increasing Torsemide x 2 days.    Recommendations: No changes.    Follow-up plan: ICM clinic phone appointment on 02/11/2016.  Copy of ICM check sent to device physician.   ICM trend: 01/08/2016       [name], RN 01/08/2016 11:21 AM

## 2016-01-08 NOTE — Telephone Encounter (Signed)
Message left on the machine to say that the we need to reschedule her appt. With me on Oct the 16.  Gave number to call back to reschedule

## 2016-01-08 NOTE — Telephone Encounter (Signed)
Remote ICM transmission received.  Attempted patient call and left detailed message regarding transmission and next ICM scheduled for 10/31/201.  Office visit with Dr Royann Shivers on 01/20/2016.  Advised to return call for any fluid symptoms or questions.

## 2016-01-08 NOTE — Progress Notes (Signed)
Great, Thanks.

## 2016-01-09 ENCOUNTER — Other Ambulatory Visit: Payer: Self-pay | Admitting: *Deleted

## 2016-01-09 NOTE — Patient Outreach (Signed)
[facility]) Care Management  01/09/2016  [name] 10-Aug-1950 [id number]   RN attempted outreach call to pt however unsuccessful but able to leave a HIPAA approved voice message and requested a call back. Will further assess and reintroduce the [facility] services for possible intervention and/or involvement post hospital discharge. Will also reschedule another transition of care call for this week.  Elliot Cousin, RN Care Management Coordinator [facility] Main Office [phone]

## 2016-01-09 NOTE — Patient Outreach (Signed)
[facility]) Care Management  01/09/2016  [name] 09-17-1950 [id number]   RN received a voices message from a friend of pt's (?[name]) who was returning a call for pt. Pt's friend indicated pt was not able to talk for a few days (3) based upon the doctor's recommendations and this is why pt has not returned a call to this RN case Production designer, theatre/television/film.  RN will reschedule tomorrow's telephone appointment to next week for ongoing transition care contacts and possible community case management services.   Elliot Cousin, RN Care Management Coordinator [facility] Main Office [phone]

## 2016-01-10 ENCOUNTER — Ambulatory Visit: Payer: Self-pay | Admitting: *Deleted

## 2016-01-13 ENCOUNTER — Ambulatory Visit (INDEPENDENT_AMBULATORY_CARE_PROVIDER_SITE_OTHER): Payer: Medicare Other | Admitting: Pulmonary Disease

## 2016-01-13 ENCOUNTER — Other Ambulatory Visit: Payer: Self-pay | Admitting: *Deleted

## 2016-01-13 ENCOUNTER — Encounter: Payer: Self-pay | Admitting: Pulmonary Disease

## 2016-01-13 VITALS — BP 126/76 | HR 83 | Ht 66.0 in | Wt 204.8 lb

## 2016-01-13 DIAGNOSIS — G4733 Obstructive sleep apnea (adult) (pediatric): Secondary | ICD-10-CM

## 2016-01-13 DIAGNOSIS — J4540 Moderate persistent asthma, uncomplicated: Secondary | ICD-10-CM | POA: Diagnosis not present

## 2016-01-13 DIAGNOSIS — K219 Gastro-esophageal reflux disease without esophagitis: Secondary | ICD-10-CM

## 2016-01-13 DIAGNOSIS — Z9989 Dependence on other enabling machines and devices: Secondary | ICD-10-CM

## 2016-01-13 NOTE — Patient Instructions (Signed)
   Continue using your inhalers and medications as prescribed.  Call me if you don't continue to improve.  I'll see you back in 2 months to make sure things are continuing to go back to your baseline.

## 2016-01-13 NOTE — Progress Notes (Signed)
Subjective:    Patient ID: [name], female    DOB: 1950/07/01, 65 y.o.   MRN: [id number]  C.C.:  Follow-up for Moderate, Persistent Asthma, GERD, & OSA.  HPI Moderate, Persistent Asthma:  Prescribed Pulmicort previously but changed to [facility] at last hospitalization. She'll last seen on 9/27 after hospitalization for asthma exacerbation. Prescribed Ultram for cough suppression. She reports her wheezing and coughing have been improving. She reports her voice still is unclear. No recent nocturnal awakenings with any coughing or dyspnea. She hasn't required her rescue inhaler. She finished her Prednisone taper yesterday. Still has some mild upper chest congestion.  GERD:  Prescribed Nexium. No morning brash water taste or reflux symptoms. Compliant with medication.   OSA:  On CPAP therapy. No problems sleeping with her CPAP. She reports she has mostly had good sleep.   Review of Systems She denies any fever, chills, or sweats. No sinus congestion, pressure, or drainage. She reports a slight upper chest tightness but no chest pain.   Allergies  Allergen Reactions  . Avelox [Moxifloxacin Hcl In Nacl] Other (See Comments)    Cardiac arrest per pt  . Simvastatin Other (See Comments)    myalgias  . Ace Inhibitors Cough    Dry cough   . Latex Rash    Current Outpatient Prescriptions on File Prior to Visit  Medication Sig Dispense Refill  . B-D ULTRAFINE III SHORT PEN 31G X 8 MM MISC use as directed 100 each 9  . buPROPion (WELLBUTRIN XL) 150 MG 24 hr tablet take 1 tablet by mouth once daily 90 tablet 3  . chlorpheniramine (CHLOR-TRIMETON) 4 MG tablet 8mg  at night 14 tablet 0  . dofetilide (TIKOSYN) 250 MCG capsule Take 250 mcg by mouth 2 (two) times daily. Reported on 09/26/2015    . fluticasone (FLONASE) 50 MCG/ACT nasal spray instill 1 spray into each nostril twice a day 16 g 5  . insulin aspart (NOVOLOG FLEXPEN) 100 UNIT/ML FlexPen Inject 4-16 Units into the skin 2 (two) times daily.  Inject twice daily based on sliding scale, BG 70-130 = 0 units, 131-180 = 4 units, 181-240 = 8 units, 241-300 = 10 units, 301-350 = 12 units, 351-400 = 16units    . Insulin Glargine (LANTUS) 100 UNIT/ML Solostar Pen Inject 50 Units into the skin at bedtime. (Patient taking differently: Inject 55 Units into the skin every morning. ) 15 mL 12  . Insulin Pen Needle (B-D ULTRAFINE III SHORT PEN) 31G X 8 MM MISC Use as directed 100 each 9  . levalbuterol (XOPENEX) 0.63 MG/3ML nebulizer solution One vial in nebulizer four times daily as needed dx 493.00 360 mL 4  . metoprolol (LOPRESSOR) 50 MG tablet Take 1.5 tablets (75 mg total) by mouth 2 (two) times daily. 270 tablet 3  . mometasone-formoterol (DULERA) 200-5 MCG/ACT AERO Inhale 2 puffs into the lungs 2 (two) times daily. 1 Inhaler 6  . montelukast (SINGULAIR) 10 MG tablet Take 1 tablet (10 mg total) by mouth at bedtime. 30 tablet 6  . NEXIUM 40 MG capsule take 1 capsule by mouth once daily 30 capsule 5  . polyethylene glycol powder (GLYCOLAX/MIRALAX) powder take 17GM (DISSOLVED IN WATER) by mouth once daily PRN, takes three times a week 255 g 5  . rosuvastatin (CRESTOR) 10 MG tablet Take 1 tablet (10 mg total) by mouth every evening.    . torsemide (DEMADEX) 10 MG tablet Take 2 tablets by mouth once daily, may take additional 2 tablets as  directed 80 tablet 5  . traMADol (ULTRAM) 50 MG tablet Take 1 tablet (50 mg total) by mouth every 6 (six) hours as needed. 40 tablet 0  . warfarin (COUMADIN) 5 MG tablet No coumadin on 9/22 Take 2.5 mg starting on 9/23 daily until seen in the coumadin clinic. 45 tablet 5  . XOPENEX HFA 45 MCG/ACT inhaler INHALE 1 TO 2 PUFFS BY MOUTH INTO THE LUNGS EVERY 4 HOURS IF NEEDED FOR WHEEZING 15 Inhaler 5   No current facility-administered medications on file prior to visit.     Past Medical History:  Diagnosis Date  . AICD (automatic cardioverter/defibrillator) present    Medtronic  . Anemia   . Anxiety   . Arthritis    . Asthma    has had multiple hospitalizations for this  . Atrial fibrillation (HCC)    ablation x 2 [facility], 01/2006, 2011.  on warfarin  . Cardiac arrest [facility](HCC) jan 2012   in hospital for pneumonia when this occured- occured at the hospital  . CHF (congestive heart failure) (HCC) 2012   Echo 08/08/10 by [facility]. EF 35-45%. LV systolic function moderately reduced. Moderate global hypokinesis of LV.  RV systolic function moderately reduced. Mild MR. Trace AR.  Marland Kitchen. Complication of anesthesia    difficult time waking up after anesthesia  . Depression   . Diabetes mellitus    Type 2  . Dysrhythmia    Atrial fibrillation  . GASTROESOPHAGEAL REFLUX, NO ESOPHAGITIS 06/10/2006   Qualifier: Diagnosis of  By: Abundio MiuMcGregor, Barbara    . GERD (gastroesophageal reflux disease)   . History of hiatal hernia   . Hyperlipidemia   . Hypertension   . Limb pain 06/04/2008   LLE, Baker's cyst in popliteal fossa, no DVT  . Non-ischemic cardiomyopathy (HCC)    echo 08/08/10 - EF 35-45% LV and RV systolic function mod reduced  . Primary localized osteoarthritis of left knee   . Primary localized osteoarthritis of right knee   . Primary localized osteoarthritis of right knee   . Sleep apnea    wears CPAP nightly  . Vocal cord disease   . Wears glasses     Past Surgical History:  Procedure Laterality Date  . BREAST LUMPECTOMY Right   . CARDIAC DEFIBRILLATOR PLACEMENT  05/02/10   Medtronic Protecta XT-DR for CHF-VT, last download 04/12/12  . CHOLECYSTECTOMY    . COLONOSCOPY    . HAMMER TOE SURGERY Right   . KNEE ARTHROSCOPY Right   . PAF Ablation     By Dr Sampson GoonFitzgerald. [facility]  . TOTAL KNEE ARTHROPLASTY Left 09/17/2014  . TOTAL KNEE ARTHROPLASTY Left 09/17/2014   Procedure: TOTAL KNEE ARTHROPLASTY;  Surgeon: Salvatore Marvelobert Wainer, MD;  [facility];  Service: Orthopedics;  Laterality: Left;  pt has ICD  . TUBAL LIGATION      Family History  Problem Relation Age of Onset  .  Diabetes Father   . Hypertension Father   . Lung disease Neg Hx   . Cancer Neg Hx   . Rheumatologic disease Neg Hx     Social History   Social History  . Marital status: Widowed    Spouse name: N/A  . Number of children: N/A  . Years of education: N/A   Social History Main Topics  . Smoking status: Never Smoker  . Smokeless tobacco: Never Used  . Alcohol use No  . Drug use: No  . Sexual activity: No   Other  Topics Concern  . None   Social History Narrative   Not employed   Exercise- walking 1 hour daily.    Diet- eating healthy diet.       [facility]:   She is originally from [place]. Has always lived in [place]. 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 Currently has a small dog. No bird or mold exposure.       Objective:   Physical Exam  BP 126/76 (BP Location: Left Arm, Cuff Size: Normal)   Pulse 83   Ht 5\' 6"  (1.676 m)   Wt 204 lb 12.8 oz (92.9 kg)   LMP 07/26/2011   SpO2 98%   BMI 33.06 kg/m  General:  Awake. Alert. No distress.  Integument:  Warm & dry. No rash on exposed skin.  HEENT:  Moist mucus membranes. Minimal bilateral nasal turbinate swelling. No scleral injection. Cardiovascular:  Regular rate. No edema. Normal S1 & S2. Pulmonary: Slightly weak voice. Good aeration bilaterally and clear to auscultation. Normal work of breathing on room air. Abdomen: Soft. Mildly protuberant. Nontender.  PFT 11/04/15: FVC 2.95 L (102%) FEV1 2.10 L (93%) FEV1/FVC 0.71 FEF 25-75 1.37 L (64%) no bronchodilator response TLC 6.02 L (109%) RV 109% ERV 47% DLCO uncorrected 75% (hgb 12.7) 12/06/08: FVC 3.43 L (99%) FEV1 2.46 L (94%) FEV1/FVC 0.72 FEF 25-75 1.59 L (85%) no bronchodilator response TLC 6.17 L (112%) RV 131% ERV 52% DLCO uncorrected 74%  IMAGING PORT CXR 12/31/15 (personally reviewed by me):  No focal opacity, mass, or effusion appreciated.  CXR PA/LAT  04/22/15 (previously reviewed by me): Tenting of left hemidiaphragm. No focal opacity or effusion appreciated. Heart normal in size & mediastinum normal in contour.  CXR PA/LAT 09/19/14 (previously reviewed by me): Questionable right basilar density/opacity. No pleural effusion appreciated. Heart normal in size. Mediastinum normal in contour.  LABS 01/08/16 CBC: 12.7/13.3/40.2/220 Eosinophils: 0 (on Prednisone)    Assessment & Plan:  65 y.o. female with underlying moderate, persistent asthma. Patient seems to be recovering well from her asthma exacerbation. She is tolerating her Dulera inhaler well. Her voice strength is continuing to improve. I did encourage her to continue to rest her voice. Reflux seems to be very well-controlled at this time and she continues to use her home CPAP faithfully. She may benefit from additional asthma controller medication such as Xolair but there are additional inhaler therapies that I would try prior to initiating Xolair therapy. She will need further workup for possible allergies as well what she has been off her desire for sufficient amount of time. I instructed the patient to notify my office if she had any new breathing problems or questions before next appointment and reviewed her chest x-ray which did not show any pneumonia or focal opacity to suggest such in September.  1. Moderate, Persistent Asthma: Continuing Dulera 200 & Singulair. Plan for CBC with differential & RAST panel as well as IgE at next appointment. 2. GERD: Asymptomatic. Continuing Nexium. 3. OSA: Excellent CPAP compliance. Continuing indefinitely. 4. Health Maintenance:  S/P Pneumovax 02/22/12, Tdap November 2013, & Prevnar 2015. Quad Influenza Vaccine August 2017. 5. Follow-up:  Return to clinic in 2 months or sooner if needed.  Donna Christen Jamison Neighbor, M.D. [facility] Pulmonary & Critical Care Pager:  [phone] After 3pm or if no response, call [phone] 9:42 AM 01/13/16

## 2016-01-13 NOTE — Patient Outreach (Signed)
[facility]) Care Management  01/13/2016  JAMISEN RAMANATHAN [birth year] [id number]   RN attempted outreach call to both numbers listed home and cell. RN only able to leave a HIPAA approved voice message requesting a call back. Will inquire further and continues to extend community case management services as needed. Will rescheduled another follow up call this week.  Elliot Cousin, RN Care Management Coordinator [facility] Main Office [phone]

## 2016-01-17 ENCOUNTER — Other Ambulatory Visit: Payer: Self-pay | Admitting: *Deleted

## 2016-01-17 NOTE — Patient Outreach (Signed)
[facility]) Care Management  01/17/2016  [name] [birth year] [id number]   RN attempted to reach pt for ongoing transition of care however unsuccessful. RN able to leave a HIPAA approved voice message and will reschedule another follow up call on Monday to reach this pt. Will reiterated on [facility] services and once again offer community home visits for ongoing case management services. Note call received last week indicating pt was not able to talk (provider's recommendations) for several days. This is the second attempt to reach pt this week. Will outreach once again next week.  Elliot Cousin, RN Care Management Coordinator [facility] Main Office [phone]

## 2016-01-20 ENCOUNTER — Other Ambulatory Visit: Payer: Self-pay | Admitting: *Deleted

## 2016-01-20 ENCOUNTER — Ambulatory Visit (INDEPENDENT_AMBULATORY_CARE_PROVIDER_SITE_OTHER): Payer: Medicare Other | Admitting: Pharmacist Clinician (PhC)/ Clinical Pharmacy Specialist

## 2016-01-20 ENCOUNTER — Other Ambulatory Visit: Payer: Self-pay | Admitting: Family Medicine

## 2016-01-20 ENCOUNTER — Encounter: Payer: Self-pay | Admitting: Cardiovascular Disease

## 2016-01-20 ENCOUNTER — Encounter: Payer: Self-pay | Admitting: *Deleted

## 2016-01-20 ENCOUNTER — Ambulatory Visit (INDEPENDENT_AMBULATORY_CARE_PROVIDER_SITE_OTHER): Payer: Medicare Other | Admitting: Cardiovascular Disease

## 2016-01-20 VITALS — BP 149/84 | HR 79 | Ht 66.0 in | Wt 207.2 lb

## 2016-01-20 DIAGNOSIS — I472 Ventricular tachycardia, unspecified: Secondary | ICD-10-CM

## 2016-01-20 DIAGNOSIS — E1165 Type 2 diabetes mellitus with hyperglycemia: Secondary | ICD-10-CM

## 2016-01-20 DIAGNOSIS — Z79899 Other long term (current) drug therapy: Secondary | ICD-10-CM

## 2016-01-20 DIAGNOSIS — I10 Essential (primary) hypertension: Secondary | ICD-10-CM

## 2016-01-20 DIAGNOSIS — Z9989 Dependence on other enabling machines and devices: Secondary | ICD-10-CM

## 2016-01-20 DIAGNOSIS — G4733 Obstructive sleep apnea (adult) (pediatric): Secondary | ICD-10-CM

## 2016-01-20 DIAGNOSIS — Z5181 Encounter for therapeutic drug level monitoring: Secondary | ICD-10-CM

## 2016-01-20 DIAGNOSIS — Z95810 Presence of automatic (implantable) cardiac defibrillator: Secondary | ICD-10-CM | POA: Diagnosis not present

## 2016-01-20 DIAGNOSIS — N182 Chronic kidney disease, stage 2 (mild): Secondary | ICD-10-CM

## 2016-01-20 DIAGNOSIS — Z7901 Long term (current) use of anticoagulants: Secondary | ICD-10-CM

## 2016-01-20 DIAGNOSIS — I480 Paroxysmal atrial fibrillation: Secondary | ICD-10-CM

## 2016-01-20 DIAGNOSIS — I5022 Chronic systolic (congestive) heart failure: Secondary | ICD-10-CM | POA: Diagnosis not present

## 2016-01-20 DIAGNOSIS — IMO0002 Reserved for concepts with insufficient information to code with codable children: Secondary | ICD-10-CM

## 2016-01-20 DIAGNOSIS — E1140 Type 2 diabetes mellitus with diabetic neuropathy, unspecified: Secondary | ICD-10-CM

## 2016-01-20 DIAGNOSIS — I4891 Unspecified atrial fibrillation: Secondary | ICD-10-CM | POA: Diagnosis not present

## 2016-01-20 DIAGNOSIS — E669 Obesity, unspecified: Secondary | ICD-10-CM

## 2016-01-20 DIAGNOSIS — E7800 Pure hypercholesterolemia, unspecified: Secondary | ICD-10-CM

## 2016-01-20 DIAGNOSIS — J4541 Moderate persistent asthma with (acute) exacerbation: Secondary | ICD-10-CM

## 2016-01-20 LAB — POCT INR: INR: 1.2

## 2016-01-20 MED ORDER — BISOPROLOL FUMARATE 5 MG PO TABS
10.0000 mg | ORAL_TABLET | Freq: Every day | ORAL | 11 refills | Status: DC
Start: 2016-01-20 — End: 2016-12-28

## 2016-01-20 NOTE — Patient Instructions (Signed)
Dr Royann Shivers has recommended making the following medication changes: 1. STOP Metoprolol 2. START Bisoprolol 5 mg - take 2 tablets by mouth every morning  Please keep your follow-up appointment with Dr C scheduled for November 7th.

## 2016-01-20 NOTE — Patient Outreach (Signed)
[facility]) Care Management  01/20/2016  [name] Aug 23, 1950 [id number]   RN received this referral based upon a recent hospitalization. The initial transition of care call pt was having issues with her voice and not able to talk for awhile base upon her doctor's orders. RN planned to contact pt today when she called this RN case Production designer, theatre/television/film. RN reintroduced the [facility] services and again the purpose for today's call. RN completed the transition of care template and the initial assessment inquiring more on pt's management of care. Pt reports she is recovering well from her asthma issues and breathing well with no distressful breathing. After review of all medications pt confirm she is taking as prescribed and has a sufficient supply on all medications. Reports all upcoming medical appointments and ongoing monitoring of her ICD with 6 month office visits to her cardiologist. Pt reports weights in monitoring her HF with last week's weight at 201 lbs, yesterday 203 lbs and today 204 lbs with no swelling or encountered issues. Pt feels her HF is controlled verifying she remains in the GREEN zone once educated on all three zones. Pt more interested in controlling her diabetes by deceasing her A1C which is currently at 9. Pt states will be repeated soon and she is trying to eat healthier food items with some exercises as tolerated. Pt states she does not take her BS in the morning and her provider is aware. Reports taking her BS prior to lunch in the middle of the day with last read of 202. Pt aware the a good fasting blood sugar should be AM prior to eating for a more adequate read. Pt has had diabetes for several years and feels she has improved but aware that some of her readings remain elevated. RN discussed the improrance of ongoing monitoring and regulating her CBG with a healthier eating habit. RN offered community home visits for a more one-on-one education on her ongoing medical condition  however pt opt to decline. RN also offered to complete the series of transition of care calls over the next few weeks and possible wean to a health coach if appropriate at that time (pt agree). A plan of care and goals generated and discussed today that RN will follow up next week on the progress.  Will schedule the next transition of care call for next week as discussed today.    Patient was recently discharged from hospital and all medications have been reviewed.  Elliot Cousin, RN Care Management Coordinator [facility] Main Office [phone]

## 2016-01-20 NOTE — Progress Notes (Signed)
Patient ID: [name], female   DOB: 29-May-1950, 65 y.o.   MRN: [id number] Patient ID: [name], female   DOB: 04/28/1950, 65 y.o.   MRN: [id number]    Cardiology Office Note    Date:  01/21/2016   ID:  [name], DOB 07-20-1950, MRN [id number]  PCP:  Garry Heater, DO  Cardiologist:   Thurmon Fair, MD   Chief Complaint  Patient presents with  . Follow-up    defib check     History of Present Illness:  [name] is a 65 y.o. female with a long-standing history of nonischemic cardiomyopathy, chronic systolic heart failure and paroxysmal atrial fibrillation.  She was recently hospitalized with asthma exacerbation and was treated with prednisone in higher doses of bronchodilators. She did have atrial fibrillation with rapid ventricular response during that hospital stay. Overall burden of atrial fibrillation remains low at 1.1%  Interrogation of her defibrillator shows that there have been 2 significant episodes of nonsustained ventricular tachycardia. On June 8 she had a 10 second episode of VT at about 220 bpm. On September 20 she had a 2 second episode of ventricular tachycardia that looks polymorphic, at 245 bpm. Neither one lead to device therapy. Optivol is in stable range. Activity level has been fairly constant at roughly 3 hours a day.  Her intracardiac electrogram shows atrial paced, ventricular sensed rhythm. There is 96% atrial pacing and only 0.7% ventricular pacing. Battery voltage is 2.83 V (RRT=2.63 V).  She reports good compliance with CPAP therapy for obstructive sleep apnea and has not had any issues with exertional dyspnea, angina, syncope, lower extremity edema, dizziness following discharge from the hospital. She is now taking long-acting bronchodilators.   She has a history of moderate nonischemic cardiomyopathy (no coronary disease by cardiac catheterization January 2012), recurrent paroxysmal atrial fibrillation status post 2 ablation procedures (Dr.  Orson Aloe), with history of ventricular fibrillation arrest while on treatment with diltiazem and dofetilide and a prolonged QT interval, probably related to simultaneous quinolone therapy. She has a dual-chamber Medtronic Protecta defibrillator implanted in January 2012. In December 2016 her defibrillator recorded an episode of sustained monomorphic ventricular tachycardia. Antitachycardia pacing was unsuccessful at converting the arrhythmia, but she had a "dirty break" before defibrillator shock was delivered. The shock was aborted.  Past Medical History:  Diagnosis Date  . AICD (automatic cardioverter/defibrillator) present    Medtronic  . Anemia   . Anxiety   . Arthritis   . Asthma    has had multiple hospitalizations for this  . Atrial fibrillation (HCC)    ablation x 2 [facility], 01/2006, 2011.  on warfarin  . Cardiac arrest [facility]) jan 2012   in hospital for pneumonia when this occured- occured at the hospital  . CHF (congestive heart failure) (HCC) 2012   Echo 08/08/10 by [facility]. EF 35-45%. LV systolic function moderately reduced. Moderate global hypokinesis of LV.  RV systolic function moderately reduced. Mild MR. Trace AR.  Marland Kitchen Complication of anesthesia    difficult time waking up after anesthesia  . Depression   . Diabetes mellitus    Type 2  . Dysrhythmia    Atrial fibrillation  . GASTROESOPHAGEAL REFLUX, NO ESOPHAGITIS 06/10/2006   Qualifier: Diagnosis of  By: Abundio Miu    . GERD (gastroesophageal reflux disease)   . History of hiatal hernia   . Hyperlipidemia   . Hypertension   . Limb pain 06/04/2008   LLE, Baker's cyst in popliteal fossa,  no DVT  . Non-ischemic cardiomyopathy (HCC)    echo 08/08/10 - EF 35-45% LV and RV systolic function mod reduced  . Primary localized osteoarthritis of left knee   . Primary localized osteoarthritis of right knee   . Primary localized osteoarthritis of right knee   . Sleep apnea    wears CPAP nightly  . Vocal cord  disease   . Wears glasses     Past Surgical History:  Procedure Laterality Date  . BREAST LUMPECTOMY Right   . CARDIAC DEFIBRILLATOR PLACEMENT  05/02/10   Medtronic Protecta XT-DR for CHF-VT, last download 04/12/12  . CHOLECYSTECTOMY    . COLONOSCOPY    . HAMMER TOE SURGERY Right   . KNEE ARTHROSCOPY Right   . PAF Ablation     By Dr Sampson GoonFitzgerald. [facility]  . TOTAL KNEE ARTHROPLASTY Left 09/17/2014  . TOTAL KNEE ARTHROPLASTY Left 09/17/2014   Procedure: TOTAL KNEE ARTHROPLASTY;  Surgeon: Salvatore Marvelobert Wainer, MD;  [facility];  Service: Orthopedics;  Laterality: Left;  pt has ICD  . TUBAL LIGATION      Outpatient Medications Prior to Visit  Medication Sig Dispense Refill  . B-D ULTRAFINE III SHORT PEN 31G X 8 MM MISC use as directed 100 each 9  . buPROPion (WELLBUTRIN XL) 150 MG 24 hr tablet take 1 tablet by mouth once daily 90 tablet 3  . chlorpheniramine (CHLOR-TRIMETON) 4 MG tablet 8mg  at night 14 tablet 0  . dofetilide (TIKOSYN) 250 MCG capsule Take 250 mcg by mouth 2 (two) times daily. Reported on 09/26/2015    . fluticasone (FLONASE) 50 MCG/ACT nasal spray instill 1 spray into each nostril twice a day 16 g 5  . insulin aspart (NOVOLOG FLEXPEN) 100 UNIT/ML FlexPen Inject 4-16 Units into the skin 2 (two) times daily. Inject twice daily based on sliding scale, BG 70-130 = 0 units, 131-180 = 4 units, 181-240 = 8 units, 241-300 = 10 units, 301-350 = 12 units, 351-400 = 16units    . Insulin Glargine (LANTUS) 100 UNIT/ML Solostar Pen Inject 50 Units into the skin at bedtime. (Patient taking differently: Inject 55 Units into the skin every morning. ) 15 mL 12  . Insulin Pen Needle (B-D ULTRAFINE III SHORT PEN) 31G X 8 MM MISC Use as directed 100 each 9  . levalbuterol (XOPENEX) 0.63 MG/3ML nebulizer solution One vial in nebulizer four times daily as needed dx 493.00 360 mL 4  . mometasone-formoterol (DULERA) 200-5 MCG/ACT AERO Inhale 2 puffs into the lungs 2 (two) times daily.  1 Inhaler 6  . montelukast (SINGULAIR) 10 MG tablet Take 1 tablet (10 mg total) by mouth at bedtime. 30 tablet 6  . NEXIUM 40 MG capsule take 1 capsule by mouth once daily 30 capsule 5  . polyethylene glycol powder (GLYCOLAX/MIRALAX) powder take 17GM (DISSOLVED IN WATER) by mouth once daily PRN, takes three times a week 255 g 5  . rosuvastatin (CRESTOR) 10 MG tablet Take 1 tablet (10 mg total) by mouth every evening.    . torsemide (DEMADEX) 10 MG tablet Take 2 tablets by mouth once daily, may take additional 2 tablets as directed 80 tablet 5  . warfarin (COUMADIN) 5 MG tablet No coumadin on 9/22 Take 2.5 mg starting on 9/23 daily until seen in the coumadin clinic. 45 tablet 5  . XOPENEX HFA 45 MCG/ACT inhaler INHALE 1 TO 2 PUFFS BY MOUTH INTO THE LUNGS EVERY 4 HOURS IF NEEDED FOR WHEEZING 15 Inhaler 5  .  metoprolol (LOPRESSOR) 50 MG tablet Take 1.5 tablets (75 mg total) by mouth 2 (two) times daily. 270 tablet 3  . traMADol (ULTRAM) 50 MG tablet Take 1 tablet (50 mg total) by mouth every 6 (six) hours as needed. (Patient not taking: Reported on 01/20/2016) 40 tablet 0   No facility-administered medications prior to visit.      Allergies:   Avelox [moxifloxacin hcl in nacl]; Simvastatin; Ace inhibitors; and Latex   Social History   Social History  . Marital status: Widowed    Spouse name: N/A  . Number of children: N/A  . Years of education: N/A   Social History Main Topics  . Smoking status: Never Smoker  . Smokeless tobacco: Never Used  . Alcohol use No  . Drug use: No  . Sexual activity: No   Other Topics Concern  . None   Social History Narrative   Not employed   Exercise- walking 1 hour daily.    Diet- eating healthy diet.       [facility]:   She is originally from [place]. Has always lived in [place]. 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 Currently has a small dog.  No bird or mold exposure.      Family History:  The patient's family history includes Diabetes in her father; Hypertension in her father.   ROS:   Please see the history of present illness.    ROS All other systems reviewed and are negative.   PHYSICAL EXAM:   VS:  BP (!) 149/84 (BP Location: Left Arm, Patient Position: Sitting, Cuff Size: Normal)   Pulse 79   Ht 5\' 6"  (1.676 m)   Wt 207 lb 3.2 oz (94 kg)   LMP 07/26/2011   SpO2 97%   BMI 33.44 kg/m    GEN: Well nourished, well developed, in no acute distress  HEENT: normal  Neck: no JVD, carotid bruits, or masses Cardiac: RRR; no murmurs, rubs, or gallops,no edema , healthy subclavian defibrillator site Respiratory:  clear to auscultation bilaterally, normal work of breathing GI: soft, nontender, nondistended, + BS MS: no deformity or atrophy  Skin: warm and dry, no rash Neuro:  Alert and Oriented x 3, Strength and sensation are intact Psych: euthymic mood, full affect  Wt Readings from Last 3 Encounters:  01/20/16 207 lb 3.2 oz (94 kg)  01/13/16 204 lb 12.8 oz (92.9 kg)  01/08/16 201 lb (91.2 kg)      Studies/Labs Reviewed:   EKG:  EKG is ordered today.  The ekg ordered 01/02/16 demonstrates atrial fibrillation, QTc 489 ms.  Recent Labs: 12/31/2015: ALT 39; B Natriuretic Peptide 79.5 01/03/2016: Magnesium 2.4 01/08/2016: BUN 26; Creatinine, Ser 1.30; Hemoglobin 13.3; Platelets 220.0; Potassium 4.4; Sodium 136   Lipid Panel    Component Value Date/Time   CHOL 117 (L) 02/14/2015 0939   TRIG 89 02/14/2015 0939   HDL 47 02/14/2015 0939   CHOLHDL 2.5 02/14/2015 0939   VLDL 18 02/14/2015 0939   LDLCALC 52 02/14/2015 0939   LDLDIRECT 94 12/07/2007 2019     ASSESSMENT:    1. Ventricular tachycardia (HCC)   2. ICD (St. Jude Protecta dual-chamber),secondary prevention (VF arrest) January 2012   3. Chronic systolic heart failure (HCC)   4. Paroxysmal atrial fibrillation (HCC)   5. Long term current use of  anticoagulant   6. Essential hypertension   7. Type 2  diabetes, uncontrolled, with neuropathy (HCC)   8. CKD (chronic kidney disease) stage 2, GFR 60-89 ml/min   9. HYPERCHOLESTEROLEMIA   10. Obesity (BMI 30-39.9)   11. OSA on CPAP   12. Visit for monitoring Tikosyn therapy   13. Moderate persistent asthma with acute exacerbation    1. VT: Infrequent recurrence. QT interval is appropriately prolonged for treatment with dofetilide, not excessive. One brief episode of polymorphic VT occurred during treatment with high doses of bronchodilators due to asthma. Due to her problems with asthma we'll try to switch from the relatively high-dose metoprolol to more beta-1 selective bisoprolol.  2. ICD: Normal device function. Will recheck her device remotely every 3 months. 3. CHF: Appears well compensated hemodynamically, NYHA functional class I, no clinical signs of hypervolemia. Thoracic impedance is in normal range. Recent normal BNP. 4. PAF: On appropriate warfarin anticoagulation. Baseline prevalence of roughly 1%, asymptomatic. 5. Warfarin anticoagulation: No bleeding complications and no history of embolic TIA/stroke. CHADVasc 4 (CHF, gender, HTN, DM). 6. HTN: controlled usually, slightly high systolic blood pressure today. Reevaluate after switching to bisoprolol 7. DM on long-term insulin therapy. Most recent A1c was elevated at 9.6%. 8. CKD: Recheck labs at least every 6 months while on dofetilide. Creatinine 1.3, at baseline recently. 9. HLP: Lipid profile last November was quite acceptable with LDL of 52, HDL 47, normal triglycerides 10. Weight loss again encouraged 11. OSA: She states that she is compliant with CPAP 12. Tikosyn: QT interval in the expected/desired range. Reviewed the importance of being aware of potential drug interactions whenever she receives a new prescription or the dose of an old prescription change. This also applies to her warfarin. 13. Asthma: Now on long-acting  bronchodilators. Discussed the competing pharmacological effects of her asthma medications and her heart medications. Switching to more selective beta blocker today.   For some reason her insurance charges more for 10 mg bisoprolol tabs (nonformulary) than it does for 25 mg.  PLAN:  In order of problems listed above:  Fascinating. Have not seen one there before. Medication Adjustments/Labs and Tests Ordered: Current medicines are reviewed at length with the patient today.  Concerns regarding medicines are outlined above.  Medication changes, Labs and Tests ordered today are listed in the Patient Instructions below. Patient Instructions  Dr Royann Shivers has recommended making the following medication changes: 1. STOP Metoprolol 2. START Bisoprolol 5 mg - take 2 tablets by mouth every morning  Please keep your follow-up appointment with Dr C scheduled for November 7th.      Signed, Thurmon Fair, MD  01/21/2016 10:52 AM    [facility] [address] [place], [place], [place]  [address] Phone: [phone]; Fax: [phone]

## 2016-01-26 NOTE — Progress Notes (Signed)
Subjective:     Patient ID: [name], female   DOB: Apr 24, 1950, 65 y.o.   MRN: [id number]  HPI [name] is a 65yo female presenting today for diabetes follow up. - Last office visit in 11/2015 for diabetes follow up. A1C 9.4 in July 2017. Goal A1C less than 8 for knee surgery. Requested sliding scale insulin for lunch and supper detailed in office visit note. Lantus increased to 55units. - Reports her blood sugar has been fluctuating up and down over the last month, complicated by hospitalization and course of prednisone - Checks blood sugar before lunch and before supper - Follows with Ophthalmology, Podiatry  Review of Systems Per HPI    Objective:   Physical Exam  Constitutional: She appears well-developed and well-nourished. No distress.  HENT:  Head: Normocephalic and atraumatic.  Cardiovascular: Normal rate and regular rhythm.   No murmur heard. Pedal pulses palpable bilaterally  Pulmonary/Chest: Breath sounds normal. She has no wheezes.  Abdominal: Soft. She exhibits no distension. There is no tenderness.  Musculoskeletal: She exhibits no edema.  Neurological:  Decreased sensation in feet bilaterally  Skin:  Callous formation on feet bilaterally  Psychiatric: She has a normal mood and affect. Her behavior is normal.      Assessment and Plan:     Type 2 diabetes, uncontrolled, with neuropathy (HCC) - A1C of 9.1 today, mildly improved from 9.4 at last visit, but not yet at goal of less than 8 to proceed with knee surgery. - Lantus increased to 57units in morning - Difficult to follow glucose trend given only presenting with log of readings before lunch and supper. Instructed to check blood sugar fasting before breakfast, prior to lunch and supper for sliding scale, and one hour after largest meal of day. - Continue sliding scale at [employer] and Supper. Encouraged to also use sliding scale at breakfast. - Follow up with pharmacy. To bring logs to that visit. - Follow up  in 3 months.

## 2016-01-27 ENCOUNTER — Encounter: Payer: Medicare Other | Admitting: Nutrition

## 2016-01-27 ENCOUNTER — Ambulatory Visit (INDEPENDENT_AMBULATORY_CARE_PROVIDER_SITE_OTHER): Payer: Medicare Other | Admitting: Family Medicine

## 2016-01-27 ENCOUNTER — Encounter: Payer: Self-pay | Admitting: Family Medicine

## 2016-01-27 ENCOUNTER — Other Ambulatory Visit: Payer: Self-pay | Admitting: *Deleted

## 2016-01-27 VITALS — BP 126/84 | HR 90 | Temp 98.3°F | Wt 206.0 lb

## 2016-01-27 DIAGNOSIS — E1140 Type 2 diabetes mellitus with diabetic neuropathy, unspecified: Secondary | ICD-10-CM | POA: Diagnosis present

## 2016-01-27 DIAGNOSIS — E1165 Type 2 diabetes mellitus with hyperglycemia: Secondary | ICD-10-CM | POA: Diagnosis not present

## 2016-01-27 DIAGNOSIS — IMO0002 Reserved for concepts with insufficient information to code with codable children: Secondary | ICD-10-CM

## 2016-01-27 LAB — POCT GLYCOSYLATED HEMOGLOBIN (HGB A1C): Hemoglobin A1C: 9.1

## 2016-01-27 MED ORDER — INSULIN GLARGINE 100 UNIT/ML SOLOSTAR PEN: 57.0000 [IU] | PEN_INJECTOR | Freq: Every day | SUBCUTANEOUS | 12 refills | Status: DC

## 2016-01-27 NOTE — Assessment & Plan Note (Signed)
-   A1C of 9.1 today, mildly improved from 9.4 at last visit, but not yet at goal of less than 8 to proceed with knee surgery. - Lantus increased to 57units in morning - Difficult to follow glucose trend given only presenting with log of readings before lunch and supper. Instructed to check blood sugar fasting before breakfast, prior to lunch and supper for sliding scale, and one hour after largest meal of day. - Continue sliding scale at [employer] and Supper. Encouraged to also use sliding scale at breakfast. - Follow up with pharmacy. To bring logs to that visit. - Follow up in 3 months.

## 2016-01-27 NOTE — Patient Outreach (Signed)
[facility]) Care Management  01/27/2016  [name] [birth year] [id number]   RN attempted to reach pt however unsuccessful. RN able to leave a HIPAA approved voice message requesting a call back. Will inquire further on pt's ongoing management of care via transition of care.  Elliot Cousin, RN Care Management Coordinator [facility] Main Office [phone]

## 2016-01-27 NOTE — Patient Instructions (Signed)
Thank you so much for coming to visit me today! Your A1C is 9.1 today. I imagine this is due to [place] just changing your regimen about one month ago (A1C is over 34months), your recent hospitalization, and recently being on Prednisone. Increase your Lantus to 57units daily. Sliding Scale at [facility] and [employer]. Please consider doing the sliding scale with Breakfast as well. Check blood sugar four times daily. Once before Breakfast, Lunch, and Dinner and then one hour after your largest meal of the day. Please schedule an appointment with our Pharmacist. Please return in 3 months.  Dr. Caroleen Hamman

## 2016-01-31 ENCOUNTER — Ambulatory Visit: Payer: Medicare Other | Admitting: *Deleted

## 2016-01-31 ENCOUNTER — Other Ambulatory Visit: Payer: Self-pay | Admitting: *Deleted

## 2016-01-31 ENCOUNTER — Ambulatory Visit (INDEPENDENT_AMBULATORY_CARE_PROVIDER_SITE_OTHER): Payer: Medicare Other | Admitting: Pharmacist Clinician (PhC)/ Clinical Pharmacy Specialist

## 2016-01-31 DIAGNOSIS — Z7901 Long term (current) use of anticoagulants: Secondary | ICD-10-CM | POA: Diagnosis not present

## 2016-01-31 DIAGNOSIS — I4891 Unspecified atrial fibrillation: Secondary | ICD-10-CM

## 2016-01-31 LAB — POCT INR: INR: 2.6

## 2016-01-31 NOTE — Patient Outreach (Signed)
[facility]) Care Management  01/31/2016  [name] 1951/03/16 [id number]   RN attempted outreach call to pt however unsuccessful. Able to leave a HIPAA approved voice message requesting a call back. Will reschedule follow up call next week for ongoing transition of care.  Elliot Cousin, RN Care Management Coordinator [facility] Main Office [phone]

## 2016-02-04 ENCOUNTER — Other Ambulatory Visit: Payer: Self-pay | Admitting: Family Medicine

## 2016-02-04 ENCOUNTER — Telehealth: Payer: Self-pay | Admitting: Pulmonary Disease

## 2016-02-04 NOTE — Telephone Encounter (Signed)
We can try her on Symbicort 160/4.5 - 2 inhalations bid - 1 inhaler with 6 refills. Or if there is a list of inhalers that her insurance will cover let me know so I can select one. Thanks.

## 2016-02-04 NOTE — Telephone Encounter (Signed)
JN  The dulera is not covered by the pts insurance.  Can we change this for the pt.  Please advise. thanks

## 2016-02-04 NOTE — Telephone Encounter (Signed)
lmtcb x1 for pt. 

## 2016-02-05 MED ORDER — BUDESONIDE-FORMOTEROL FUMARATE 160-4.5 MCG/ACT IN AERO: 2.0000 | INHALATION_SPRAY | Freq: Two times a day (BID) | RESPIRATORY_TRACT | 6 refills | Status: DC

## 2016-02-05 NOTE — Telephone Encounter (Signed)
Spoke with pt. She is aware of the medication change. Rx has been sent in. Nothing further was needed. 

## 2016-02-05 NOTE — Telephone Encounter (Signed)
lmtcb x2 for pt. 

## 2016-02-05 NOTE — Telephone Encounter (Signed)
Pt returned phone call...contact number [phone].Charm Rings

## 2016-02-07 ENCOUNTER — Other Ambulatory Visit: Payer: Self-pay | Admitting: *Deleted

## 2016-02-07 ENCOUNTER — Encounter: Payer: Self-pay | Admitting: *Deleted

## 2016-02-07 NOTE — Patient Outreach (Signed)
[facility]) Care Management  02/07/2016  DESAREY COURSER May 21, 1950 [id number]   Third out reach attempt however unsuccessful. RN left a HIPAA approved voice message requesting a call back. Will send outreach letter based upon the unsuccessful contacts and allow time for pt to follow up with a call back.   Elliot Cousin, RN Care Management Coordinator [facility] Main Office [phone]

## 2016-02-11 ENCOUNTER — Ambulatory Visit (INDEPENDENT_AMBULATORY_CARE_PROVIDER_SITE_OTHER): Payer: Medicare Other

## 2016-02-11 DIAGNOSIS — I5022 Chronic systolic (congestive) heart failure: Secondary | ICD-10-CM | POA: Diagnosis not present

## 2016-02-11 DIAGNOSIS — Z95810 Presence of automatic (implantable) cardiac defibrillator: Secondary | ICD-10-CM

## 2016-02-11 NOTE — Progress Notes (Signed)
EPIC Encounter for ICM Monitoring  Patient Name: [name] is a 65 y.o. female Date: 02/11/2016 Primary Care Physican: Garry Heater, DO Primary Cardiologist:Croitoru Electrophysiologist: Croitoru Dry Weight:  210 lbs               Heart Failure questions reviewed, pt weight has increased from 201 lbs (at time of hospital discharge) to 210 lbs.    Thoracic impedance abnormal suggesting fluid accumulation.  She took extra Torsemide last week x 2 days but no improvement in impedance.  She stated she thinks Torsemide is not working as well now.  Recommendations:  Advised to increase Torsemide 20 mg to 2 tablets am and 2 tablets pm x 3 days and returned to prescribed dosage after 3rd day.  Advised she will have a defib check in the office with Dr Royann Shivers 11/7  Follow-up plan: ICM clinic phone appointment on 03/20/2016.    Copy of ICM check sent to cardiologist/device physician.   ICM trend: 02/11/2016       [name], RN 02/11/2016 2:56 PM

## 2016-02-11 NOTE — Progress Notes (Signed)
Thank you. I'll follow up on it next week at her appointment MCr

## 2016-02-18 ENCOUNTER — Ambulatory Visit (INDEPENDENT_AMBULATORY_CARE_PROVIDER_SITE_OTHER): Payer: Medicare Other | Admitting: Pharmacist

## 2016-02-18 ENCOUNTER — Ambulatory Visit: Payer: Medicare Other | Admitting: Podiatry

## 2016-02-18 ENCOUNTER — Ambulatory Visit (INDEPENDENT_AMBULATORY_CARE_PROVIDER_SITE_OTHER): Payer: Medicare Other | Admitting: Cardiovascular Disease

## 2016-02-18 ENCOUNTER — Encounter: Payer: Self-pay | Admitting: Cardiovascular Disease

## 2016-02-18 VITALS — BP 128/86 | HR 87 | Ht 67.0 in | Wt 208.4 lb

## 2016-02-18 DIAGNOSIS — I10 Essential (primary) hypertension: Secondary | ICD-10-CM | POA: Diagnosis not present

## 2016-02-18 DIAGNOSIS — IMO0002 Reserved for concepts with insufficient information to code with codable children: Secondary | ICD-10-CM

## 2016-02-18 DIAGNOSIS — Z7901 Long term (current) use of anticoagulants: Secondary | ICD-10-CM | POA: Diagnosis not present

## 2016-02-18 DIAGNOSIS — G4733 Obstructive sleep apnea (adult) (pediatric): Secondary | ICD-10-CM | POA: Diagnosis not present

## 2016-02-18 DIAGNOSIS — J4540 Moderate persistent asthma, uncomplicated: Secondary | ICD-10-CM | POA: Diagnosis not present

## 2016-02-18 DIAGNOSIS — I4891 Unspecified atrial fibrillation: Secondary | ICD-10-CM | POA: Diagnosis not present

## 2016-02-18 DIAGNOSIS — I472 Ventricular tachycardia, unspecified: Secondary | ICD-10-CM

## 2016-02-18 DIAGNOSIS — E1140 Type 2 diabetes mellitus with diabetic neuropathy, unspecified: Secondary | ICD-10-CM

## 2016-02-18 DIAGNOSIS — N182 Chronic kidney disease, stage 2 (mild): Secondary | ICD-10-CM | POA: Diagnosis not present

## 2016-02-18 DIAGNOSIS — Z95810 Presence of automatic (implantable) cardiac defibrillator: Secondary | ICD-10-CM

## 2016-02-18 DIAGNOSIS — I480 Paroxysmal atrial fibrillation: Secondary | ICD-10-CM

## 2016-02-18 DIAGNOSIS — Z5181 Encounter for therapeutic drug level monitoring: Secondary | ICD-10-CM | POA: Diagnosis not present

## 2016-02-18 DIAGNOSIS — E669 Obesity, unspecified: Secondary | ICD-10-CM | POA: Diagnosis not present

## 2016-02-18 DIAGNOSIS — Z79899 Other long term (current) drug therapy: Secondary | ICD-10-CM

## 2016-02-18 DIAGNOSIS — I5022 Chronic systolic (congestive) heart failure: Secondary | ICD-10-CM | POA: Diagnosis not present

## 2016-02-18 DIAGNOSIS — Z9989 Dependence on other enabling machines and devices: Secondary | ICD-10-CM

## 2016-02-18 DIAGNOSIS — E7800 Pure hypercholesterolemia, unspecified: Secondary | ICD-10-CM

## 2016-02-18 DIAGNOSIS — E1165 Type 2 diabetes mellitus with hyperglycemia: Secondary | ICD-10-CM

## 2016-02-18 LAB — POCT INR: INR: 2.8

## 2016-02-18 MED ORDER — POTASSIUM CHLORIDE CRYS ER 20 MEQ PO TBCR: EXTENDED_RELEASE_TABLET | ORAL | 11 refills | Status: DC

## 2016-02-18 MED ORDER — TORSEMIDE 20 MG PO TABS: ORAL_TABLET | ORAL | 11 refills | Status: DC

## 2016-02-18 NOTE — Patient Instructions (Signed)
Dr Royann Shivers has recommended making the following medication changes: 1. TAKE Torsedmide 20 mg twice daily if weight is 201lbs or more.          >>On days when weight is less than 201lbs, take 1 tablet once daily 2. START Potassium 20 mEq - take 1 tablet only on days you take Torsemide twice daily  Your physician recommends that you schedule a follow-up appointment in 3 months with Dr Royann Shivers.  If you need a refill on your cardiac medications before your next appointment, please call your pharmacy.

## 2016-02-18 NOTE — Progress Notes (Signed)
Patient ID: [name], female   DOB: May 15, 1950, 65 y.o.   MRN: [id number] Patient ID: [name], female   DOB: 03-09-1951, 65 y.o.   MRN: [id number]    Cardiology Office Note    Date:  02/18/2016   ID:  [name], DOB [birth year], MRN [id number]  PCP:  Garry Heater, DO  Cardiologist:   Thurmon Fair, MD   Chief Complaint  Patient presents with  . Follow-up    pt c/o increase fluid and bloating, swelling ing legs and feet    History of Present Illness:  [name] is a 65 y.o. female with a long-standing history of nonischemic cardiomyopathy, chronic systolic heart failure and paroxysmal atrial fibrillation.  She reports that she still feels rather bloated and a little dyspneic. She felt great when she weighed only 201 pounds, but now is up to 207 pounds on her home scale. She has worsened edema towards the end of the day. She has been intermittently taking twice her usual dose of torsemide to help with the fluid. She has not had orthopnea or PND and denies chest pain, palpitations, syncope or defibrillator discharges.  She was recently hospitalized with asthma exacerbation and was treated with prednisone in higher doses of bronchodilators. She did have atrial fibrillation with rapid ventricular response during that hospital stay. The frequency of atrial fibrillation events has returned to her baseline and overall burden of atrial fibrillation remains low at 0.7%. Ventricular rate control during the bursts of atrial fibrillation is not optimal, with ventricular rates around 115 bpm, but she is on the maximum usual dose of bisoprolol. We switched to the more beta 1 selective agent due to her problems with asthma.  Interrogation of her defibrillator shows that there have not been any new episodes of ventricular arrhythmia since her last appointment  Optiivol is steadily increasing over the last couple of weeks, although seems to be rebounding just the last day or 2. Activity level  has returned to her baseline before her recent hospitalization.  Her ECG shows atrial paced, ventricular sensed rhythm. There is 97% atrial pacing and only 0.7% ventricular pacing. Battery voltage is 2.82 V (RRT=2.63 V).  She reports good compliance with CPAP therapy for obstructive sleep apnea and has not had any issues with exertional dyspnea, angina, syncope, lower extremity edema, dizziness following discharge from the hospital. She is now taking long-acting bronchodilators.   She has a history of moderate nonischemic cardiomyopathy (no coronary disease by cardiac catheterization January 2012), recurrent paroxysmal atrial fibrillation status post 2 ablation procedures (Dr. Orson Aloe), with history of ventricular fibrillation arrest while on treatment with diltiazem and dofetilide and a prolonged QT interval, probably related to simultaneous quinolone therapy. She has a dual-chamber Medtronic Protecta defibrillator implanted in January 2012. In December 2016 her defibrillator recorded an episode of sustained monomorphic ventricular tachycardia. Antitachycardia pacing was unsuccessful at converting the arrhythmia, but she had a "dirty break" before defibrillator shock was delivered. The shock was aborted. Polymorphic VT lasting 2 seconds was recorded in the fall of 2017 during asthma exacerbation.  Past Medical History:  Diagnosis Date  . AICD (automatic cardioverter/defibrillator) present    Medtronic  . Anemia   . Anxiety   . Arthritis   . Asthma    has had multiple hospitalizations for this  . Atrial fibrillation (HCC)    ablation x 2 [facility], 01/2006, 2011.  on warfarin  . Cardiac arrest [facility]) jan 2012   in hospital for pneumonia when  this occured- occured at the hospital  . CHF (congestive heart failure) (HCC) 2012   Echo 08/08/10 by [facility]. EF 35-45%. LV systolic function moderately reduced. Moderate global hypokinesis of LV.  RV systolic function moderately reduced. Mild MR.  Trace AR.  Marland Kitchen Complication of anesthesia    difficult time waking up after anesthesia  . Depression   . Diabetes mellitus    Type 2  . Dysrhythmia    Atrial fibrillation  . GASTROESOPHAGEAL REFLUX, NO ESOPHAGITIS 06/10/2006   Qualifier: Diagnosis of  By: Abundio Miu    . GERD (gastroesophageal reflux disease)   . History of hiatal hernia   . Hyperlipidemia   . Hypertension   . Limb pain 06/04/2008   LLE, Baker's cyst in popliteal fossa, no DVT  . Non-ischemic cardiomyopathy (HCC)    echo 08/08/10 - EF 35-45% LV and RV systolic function mod reduced  . Primary localized osteoarthritis of left knee   . Primary localized osteoarthritis of right knee   . Primary localized osteoarthritis of right knee   . Sleep apnea    wears CPAP nightly  . Vocal cord disease   . Wears glasses     Past Surgical History:  Procedure Laterality Date  . BREAST LUMPECTOMY Right   . CARDIAC DEFIBRILLATOR PLACEMENT  05/02/10   Medtronic Protecta XT-DR for CHF-VT, last download 04/12/12  . CHOLECYSTECTOMY    . COLONOSCOPY    . HAMMER TOE SURGERY Right   . KNEE ARTHROSCOPY Right   . PAF Ablation     By Dr Sampson Goon. Now sees Dr Steele Berg [facility]  . TOTAL KNEE ARTHROPLASTY Left 09/17/2014  . TOTAL KNEE ARTHROPLASTY Left 09/17/2014   Procedure: TOTAL KNEE ARTHROPLASTY;  Surgeon: Salvatore Marvel, MD;  Location: [facility] OR;  Service: Orthopedics;  Laterality: Left;  pt has ICD  . TUBAL LIGATION      Outpatient Medications Prior to Visit  Medication Sig Dispense Refill  . B-D ULTRAFINE III SHORT PEN 31G X 8 MM MISC use as directed 100 each 9  . bisoprolol (ZEBETA) 5 MG tablet Take 2 tablets (10 mg total) by mouth daily. 60 tablet 11  . budesonide-formoterol (SYMBICORT) 160-4.5 MCG/ACT inhaler Inhale 2 puffs into the lungs 2 (two) times daily. 1 Inhaler 6  . buPROPion (WELLBUTRIN XL) 150 MG 24 hr tablet take 1 tablet by mouth once daily 90 tablet 3  . chlorpheniramine (CHLOR-TRIMETON) 4 MG tablet 8mg  at night  14 tablet 0  . dofetilide (TIKOSYN) 250 MCG capsule Take 250 mcg by mouth 2 (two) times daily. Reported on 09/26/2015    . fluticasone (FLONASE) 50 MCG/ACT nasal spray instill 1 spray into each nostril twice a day 16 g 5  . insulin aspart (NOVOLOG FLEXPEN) 100 UNIT/ML FlexPen Inject 4-16 Units into the skin 2 (two) times daily. Inject twice daily based on sliding scale, BG 70-130 = 0 units, 131-180 = 4 units, 181-240 = 8 units, 241-300 = 10 units, 301-350 = 12 units, 351-400 = 16units    . Insulin Glargine (LANTUS) 100 UNIT/ML Solostar Pen Inject 57 Units into the skin at bedtime. 15 mL 12  . Insulin Pen Needle (B-D ULTRAFINE III SHORT PEN) 31G X 8 MM MISC Use as directed 100 each 9  . levalbuterol (XOPENEX) 0.63 MG/3ML nebulizer solution One vial in nebulizer four times daily as needed dx 493.00 360 mL 4  . mometasone-formoterol (DULERA) 200-5 MCG/ACT AERO Inhale 2 puffs into the lungs 2 (two) times daily. 1  Inhaler 6  . montelukast (SINGULAIR) 10 MG tablet Take 1 tablet (10 mg total) by mouth at bedtime. 30 tablet 6  . NEXIUM 40 MG capsule take 1 capsule by mouth once daily 30 capsule 5  . polyethylene glycol powder (GLYCOLAX/MIRALAX) powder take 17GM (DISSOLVED IN WATER) by mouth once daily THREE TIMES A WEEK if needed as directed 500 g 3  . rosuvastatin (CRESTOR) 10 MG tablet Take 1 tablet (10 mg total) by mouth every evening.    . warfarin (COUMADIN) 5 MG tablet No coumadin on 9/22 Take 2.5 mg starting on 9/23 daily until seen in the coumadin clinic. 45 tablet 5  . XOPENEX HFA 45 MCG/ACT inhaler INHALE 1 TO 2 PUFFS BY MOUTH INTO THE LUNGS EVERY 4 HOURS IF NEEDED FOR WHEEZING 15 Inhaler 5  . torsemide (DEMADEX) 10 MG tablet Take 2 tablets by mouth once daily, may take additional 2 tablets as directed 80 tablet 5  . insulin aspart (NOVOLOG FLEXPEN) 100 UNIT/ML FlexPen Use twice daily with meals: BG70-130: 0 units BG131-190: 4 units BG191-240: 8units BG241-300: 10units BG301-350: 12units BG351-400:  16units 15 mL 9   No facility-administered medications prior to visit.      Allergies:   Avelox [moxifloxacin hcl in nacl]; Simvastatin; Ace inhibitors; and Latex   Social History   Social History  . Marital status: Widowed    Spouse name: N/A  . Number of children: N/A  . Years of education: N/A   Social History Main Topics  . Smoking status: Never Smoker  . Smokeless tobacco: Never Used  . Alcohol use No  . Drug use: No  . Sexual activity: No   Other Topics Concern  . Not on file   Social History Narrative   Not employed   Exercise- walking 1 hour daily.    Diet- eating healthy diet.       [facility]:   She is originally from [place]. Has always lived in [place]. 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 Currently has a small dog. No bird or mold exposure.      Family History:  The patient's family history includes Diabetes in her father; Hypertension in her father.   ROS:   Please see the history of present illness.    ROS All other systems reviewed and are negative.   PHYSICAL EXAM:   VS:  BP 128/86   Pulse 87   Ht 5\' 7"  (1.702 m)   Wt 208 lb 6.4 oz (94.5 kg)   LMP 07/26/2011   BMI 32.64 kg/m    GEN: Well nourished, well developed, in no acute distress  HEENT: normal  Neck: no JVD, carotid bruits, or masses Cardiac: RRR; no murmurs, rubs, or gallops,no edema , healthy subclavian defibrillator site Respiratory:  clear to auscultation bilaterally, normal work of breathing GI: soft, nontender, nondistended, + BS MS: no deformity or atrophy  Skin: warm and dry, no rash Neuro:  Alert and Oriented x 3, Strength and sensation are intact Psych: euthymic mood, full affect  Wt Readings from Last 3 Encounters:  02/18/16 208 lb 6.4 oz (94.5 kg)  01/27/16 206 lb (93.4 kg)  01/20/16 207 lb 3.2 oz (94 kg)      Studies/Labs Reviewed:   EKG:  EKG is ordered  today.  The ekg ordered 01/02/16 demonstrates atrial fibrillation, QTc 489 ms.  Recent Labs: 12/31/2015: ALT 39; B Natriuretic Peptide  79.5 01/03/2016: Magnesium 2.4 01/08/2016: BUN 26; Creatinine, Ser 1.30; Hemoglobin 13.3; Platelets 220.0; Potassium 4.4; Sodium 136   Lipid Panel    Component Value Date/Time   CHOL 117 (L) 02/14/2015 0939   TRIG 89 02/14/2015 0939   HDL 47 02/14/2015 0939   CHOLHDL 2.5 02/14/2015 0939   VLDL 18 02/14/2015 0939   LDLCALC 52 02/14/2015 0939   LDLDIRECT 94 12/07/2007 2019     ASSESSMENT:    1. Ventricular tachycardia (HCC)   2. ICD (St. Jude Protecta dual-chamber),secondary prevention (VF arrest) January 2012   3. Chronic systolic heart failure (HCC)   4. Paroxysmal atrial fibrillation (HCC)   5. Long term current use of anticoagulant therapy   6. Essential hypertension   7. HYPERCHOLESTEROLEMIA   8. Obesity (BMI 30-39.9)   9. OSA on CPAP   10. Visit for monitoring Tikosyn therapy   11. Moderate persistent asthma without complication   12. Type 2 diabetes, uncontrolled, with neuropathy (HCC)   13. CKD (chronic kidney disease) stage 2, GFR 60-89 ml/min    1. VT: Infrequent recurrence, None since her last appointment. QT interval is appropriately prolonged for treatment with dofetilide, not excessive. Now on beta-1 selective bisoprolol.  2. ICD: Normal device function. Will recheck her device remotely every 3 months. 3. CHF: Reports symptoms of fluid overload, NYHA functional class II. Thoracic impedance is indeed suggestive of mild fluid overload. Recent normal BNP. Will give her a weight-based diuretic prescription. Take 20 mg torsemide if weight is 201 pounds or less on home scale, otherwise take 20 mg twice daily. When she takes the twice daily torsemide she should take a potassium supplement. 4. PAF: Well controlled on dofetilide. Baseline prevalence of roughly 0.7%, usually asymptomatic. Rate control is not optimal, but we are on the maximum  usual prescribed dose of bisoprolol in a patient with heart failure. I would avoid use of diltiazem or verapamil and the more it about the risks of digoxin toxicity. Since the overall burden of atrial fibrillation is very low will continue the current medications. 5. Warfarin anticoagulation: No bleeding complications and no history of embolic TIA/stroke. CHADVasc 4 (CHF, gender, HTN, DM). 6. HTN: controlled after switching to bisoprolol. 7. HLP: Lipid profile last November was quite acceptable with LDL of 52, HDL 47, normal triglycerides 8. Obesity: Weight loss again encouraged 9. OSA: She states that she is compliant with CPAP 10. Tikosyn: QT interval in the expected/desired range. Reviewed the importance of being aware of potential drug interactions whenever she receives a new prescription or the dose of an old prescription change. This also applies to her warfarin. 11. Asthma: Now on long-acting bronchodilators. Discussed the competing pharmacological effects of her asthma medications and her heart medications. Switchied to more selective beta blocker today. 12. DM on long-term insulin therapy. Most recent A1c was elevated at 9.1%, but this was an improvement from the previous recording. 13. CKD: Recheck labs at least every 6 months while on dofetilide. Creatinine 1.3, at baseline recently.   For some reason her insurance charges more for 10 mg bisoprolol tabs (nonformulary) than it does for 25 mg.  PLAN:  In order of problems listed above:  Fascinating. Have not seen one there before. Medication Adjustments/Labs and Tests Ordered: Current medicines are reviewed at length with the patient today.  Concerns regarding medicines are outlined above.  Medication changes, Labs and Tests ordered today are listed in the Patient Instructions below. Patient Instructions  Dr Royann Shivers has recommended making the following  medication changes: 1. TAKE Torsedmide 20 mg twice daily if weight is 201lbs or  more.          >>On days when weight is less than 201lbs, take 1 tablet once daily 2. START Potassium 20 mEq - take 1 tablet only on days you take Torsemide twice daily  Your physician recommends that you schedule a follow-up appointment in 3 months with Dr Royann Shiversroitoru.  If you need a refill on your cardiac medications before your next appointment, please call your pharmacy.      Signed, Thurmon FairMihai Stevee Valenta, MD  02/18/2016 9:31 AM    [facility] [address], [place], [place]  [address] Phone: [phone]) [phone]; Fax: [phone]

## 2016-02-24 ENCOUNTER — Encounter: Payer: Self-pay | Admitting: *Deleted

## 2016-02-24 NOTE — Patient Outreach (Signed)
[facility]) Care Management  02/24/2016  ANWYN PILA 1950-09-21 [id number]   RN requested outreach letter to be mailed to pt several weeks ago however no response. Based upon the unsuccessful outreach calls and unaswered notification will notify the primary provider and close this case. Case will be closed.  Elliot Cousin, RN Care Management Coordinator [facility] Main Office [phone]

## 2016-02-25 ENCOUNTER — Encounter: Payer: Self-pay | Admitting: *Deleted

## 2016-02-25 DIAGNOSIS — M1711 Unilateral primary osteoarthritis, right knee: Secondary | ICD-10-CM | POA: Diagnosis not present

## 2016-02-25 NOTE — Patient Outreach (Signed)
[facility]) Care Management  02/25/2016  [name] 1950-11-02 [id number]   [facility]) Care Management  02/24/2016  [name] 1951/01/16 [id number]   No return call or contact from this pt since outreach letter mailed last month. Therefore this case will be closed and provider notified.   Elliot Cousin, RN Care Management Coordinator [facility] Main Office [phone]

## 2016-02-26 ENCOUNTER — Telehealth: Payer: Self-pay | Admitting: Cardiology

## 2016-02-26 NOTE — Telephone Encounter (Signed)
Pt called and wanted to let ICM clinic nurse know that she went to her knee doctor on Tuesday 02-25-16 and he had to draw fluid off her knee.

## 2016-02-29 ENCOUNTER — Other Ambulatory Visit: Payer: Self-pay | Admitting: Cardiovascular Disease

## 2016-03-06 ENCOUNTER — Other Ambulatory Visit (HOSPITAL_COMMUNITY): Payer: Medicare Other

## 2016-03-08 ENCOUNTER — Emergency Department (HOSPITAL_COMMUNITY): Payer: Medicare Other

## 2016-03-08 ENCOUNTER — Encounter (HOSPITAL_COMMUNITY): Payer: Self-pay | Admitting: Emergency Medicine

## 2016-03-08 ENCOUNTER — Emergency Department (HOSPITAL_COMMUNITY)
Admission: AD | Admit: 2016-03-08 | Discharge: 2016-03-08 | Disposition: A | Discharge status: Home / Self Care | Payer: Medicare Other | Attending: Emergency Medicine | Admitting: Emergency Medicine

## 2016-03-08 DIAGNOSIS — M25531 Pain in right wrist: Secondary | ICD-10-CM | POA: Diagnosis not present

## 2016-03-08 DIAGNOSIS — I509 Heart failure, unspecified: Secondary | ICD-10-CM | POA: Diagnosis not present

## 2016-03-08 DIAGNOSIS — M7989 Other specified soft tissue disorders: Secondary | ICD-10-CM | POA: Diagnosis not present

## 2016-03-08 DIAGNOSIS — Z95810 Presence of automatic (implantable) cardiac defibrillator: Secondary | ICD-10-CM | POA: Diagnosis not present

## 2016-03-08 DIAGNOSIS — J45909 Unspecified asthma, uncomplicated: Secondary | ICD-10-CM | POA: Insufficient documentation

## 2016-03-08 DIAGNOSIS — Z91040 Latex allergy status: Secondary | ICD-10-CM | POA: Diagnosis not present

## 2016-03-08 DIAGNOSIS — N182 Chronic kidney disease, stage 2 (mild): Secondary | ICD-10-CM | POA: Diagnosis not present

## 2016-03-08 DIAGNOSIS — Z794 Long term (current) use of insulin: Secondary | ICD-10-CM | POA: Diagnosis not present

## 2016-03-08 DIAGNOSIS — E1140 Type 2 diabetes mellitus with diabetic neuropathy, unspecified: Secondary | ICD-10-CM | POA: Diagnosis not present

## 2016-03-08 DIAGNOSIS — M11231 Other chondrocalcinosis, right wrist: Secondary | ICD-10-CM

## 2016-03-08 DIAGNOSIS — Z96652 Presence of left artificial knee joint: Secondary | ICD-10-CM | POA: Diagnosis not present

## 2016-03-08 DIAGNOSIS — M109 Gout, unspecified: Secondary | ICD-10-CM | POA: Diagnosis not present

## 2016-03-08 DIAGNOSIS — Z7901 Long term (current) use of anticoagulants: Secondary | ICD-10-CM | POA: Insufficient documentation

## 2016-03-08 DIAGNOSIS — I130 Hypertensive heart and chronic kidney disease with heart failure and stage 1 through stage 4 chronic kidney disease, or unspecified chronic kidney disease: Secondary | ICD-10-CM | POA: Insufficient documentation

## 2016-03-08 MED ORDER — ONDANSETRON 4 MG PO TBDP
4.0000 mg | ORAL_TABLET | Freq: Once | ORAL | Status: AC
  Administered 2016-03-08: 4 mg via ORAL
  Filled 2016-03-08: qty 1

## 2016-03-08 MED ORDER — OXYCODONE-ACETAMINOPHEN 5-325 MG PO TABS: 1.0000 | ORAL_TABLET | Freq: Four times a day (QID) | ORAL | 0 refills | Status: DC | PRN

## 2016-03-08 MED ORDER — IBUPROFEN 600 MG PO TABS: 600.0000 mg | ORAL_TABLET | Freq: Four times a day (QID) | ORAL | 0 refills | Status: DC | PRN

## 2016-03-08 MED ORDER — OXYCODONE-ACETAMINOPHEN 5-325 MG PO TABS
1.0000 | ORAL_TABLET | Freq: Once | ORAL | Status: AC
  Administered 2016-03-08: 1 via ORAL
  Filled 2016-03-08: qty 1

## 2016-03-08 NOTE — ED Notes (Signed)
Ortho tech paged for splinting.  ?

## 2016-03-08 NOTE — ED Triage Notes (Addendum)
Pt reports R wrist swelling that started Saturday morning.  She was placing ice on wrist without relief.  Pt sts tonight the pain work her from sleep and she noted increased swelling.  Pt reports numbness and loss of sensation to R thumb and forefinger.  Sts she had fluid taken off her knee a few days ago as well.

## 2016-03-08 NOTE — ED Provider Notes (Signed)
[facility]-EMERGENCY DEPT Provider Note   CSN: [id number] Arrival date & time: 03/08/16  0309  By signing my name below, I, Suzan Slick. Elon Spanner, attest that this documentation has been prepared under the direction and in the presence of Tekeshia Klahr PA-C.  Electronically Signed: Suzan Slick. Elon Spanner, ED Scribe. 03/08/16. 3:51 AM.    History   Chief Complaint Chief Complaint  Patient presents with  . Wrist Pain    Right   The history is provided by the patient. No language interpreter was used.    HPI Comments: [name] is a 65 y.o. female who presents to the Emergency Department complaining of constant, unchanged R wrist pain with associated swelling onset early this morning that woke her from sleep. No recent traumas or falls. Discomfort is made worse with movement of the hand. No alleviating factors reported. Ice application attempted at home without any improvement. No recent fever or chills. No weakness reported. PSHx includes R sided wrist surgery approximately 30 years ago.  PCP: Garry Heater, DO    Past Medical History:  Diagnosis Date  . AICD (automatic cardioverter/defibrillator) present    Medtronic  . Anemia   . Anxiety   . Arthritis   . Asthma    has had multiple hospitalizations for this  . Atrial fibrillation (HCC)    ablation x 2 [facility], 01/2006, 2011.  on warfarin  . Cardiac arrest [facility]) jan 2012   in hospital for pneumonia when this occured- occured at the hospital  . CHF (congestive heart failure) (HCC) 2012   Echo 08/08/10 by [facility]. EF 35-45%. LV systolic function moderately reduced. Moderate global hypokinesis of LV.  RV systolic function moderately reduced. Mild MR. Trace AR.  Marland Kitchen Complication of anesthesia    difficult time waking up after anesthesia  . Depression   . Diabetes mellitus    Type 2  . Dysrhythmia    Atrial fibrillation  . GASTROESOPHAGEAL REFLUX, NO ESOPHAGITIS 06/10/2006   Qualifier: Diagnosis of  By: Abundio Miu    . GERD  (gastroesophageal reflux disease)   . History of hiatal hernia   . Hyperlipidemia   . Hypertension   . Limb pain 06/04/2008   LLE, Baker's cyst in popliteal fossa, no DVT  . Non-ischemic cardiomyopathy (HCC)    echo 08/08/10 - EF 35-45% LV and RV systolic function mod reduced  . Primary localized osteoarthritis of left knee   . Primary localized osteoarthritis of right knee   . Primary localized osteoarthritis of right knee   . Sleep apnea    wears CPAP nightly  . Vocal cord disease   . Wears glasses     Patient Active Problem List   Diagnosis Date Noted  . Acute asthma exacerbation 12/31/2015  . Primary localized osteoarthritis of right knee   . Visit for monitoring Tikosyn therapy 08/21/2015  . Long term current use of anticoagulant therapy 08/21/2015  . Preoperative cardiovascular examination 08/21/2015  . Ventricular tachycardia (HCC) 05/22/2015  . Obesity (BMI 30-39.9) 01/04/2014  . ICD (St. Jude Protecta dual-chamber),secondary prevention (VF arrest) January 2012 11/19/2012  . CKD (chronic kidney disease) stage 2, GFR 60-89 ml/min 02/22/2012  . Asthma, moderate persistent 12/10/2011  . Health maintenance examination 11/02/2011  . Diabetic neuropathy (HCC) 09/18/2011  . Chronic systolic heart failure (HCC) 05/09/2010  . OSTEOARTHRITIS, KNEE 08/28/2008  . DISEASE, VOCAL CORD NEC 10/13/2006  . Type 2 diabetes, uncontrolled, with neuropathy (HCC) 06/10/2006  . HYPERCHOLESTEROLEMIA 06/10/2006  . Essential hypertension 06/10/2006  .  Atrial fibrillation (HCC) 06/10/2006  . OSA on CPAP 06/10/2006    Past Surgical History:  Procedure Laterality Date  . BREAST LUMPECTOMY Right   . CARDIAC DEFIBRILLATOR PLACEMENT  05/02/10   Medtronic Protecta XT-DR for CHF-VT, last download 04/12/12  . CHOLECYSTECTOMY    . COLONOSCOPY    . HAMMER TOE SURGERY Right   . KNEE ARTHROSCOPY Right   . PAF Ablation     By Dr Sampson Goon. Now sees Dr Steele Berg at [facility]  . TOTAL KNEE ARTHROPLASTY  Left 09/17/2014  . TOTAL KNEE ARTHROPLASTY Left 09/17/2014   Procedure: TOTAL KNEE ARTHROPLASTY;  Surgeon: Salvatore Marvel, MD;  Location: [facility] OR;  Service: Orthopedics;  Laterality: Left;  pt has ICD  . TUBAL LIGATION      OB History    No data available       Home Medications    Prior to Admission medications   Medication Sig Start Date End Date Taking? Authorizing Provider  B-D ULTRAFINE III SHORT PEN 31G X 8 MM MISC use as directed    Dayarmys Piloto de Criselda Peaches, MD  bisoprolol (ZEBETA) 5 MG tablet Take 2 tablets (10 mg total) by mouth daily. 01/20/16   Mihai Croitoru, MD  budesonide-formoterol (SYMBICORT) 160-4.5 MCG/ACT inhaler Inhale 2 puffs into the lungs 2 (two) times daily. 02/05/16   Roslynn Amble, MD  buPROPion (WELLBUTRIN XL) 150 MG 24 hr tablet take 1 tablet by mouth once daily 03/25/15   Lora Havens Pritchett, DO  chlorpheniramine (CHLOR-TRIMETON) 4 MG tablet 8mg  at night 10/17/12   Storm Frisk, MD  dofetilide (TIKOSYN) 250 MCG capsule Take 250 mcg by mouth 2 (two) times daily. Reported on 09/26/2015    Historical Provider, MD  fluticasone (FLONASE) 50 MCG/ACT nasal spray instill 1 spray into each nostril twice a day 08/05/15   [place]. [place] N Rumley, DO  insulin aspart (NOVOLOG FLEXPEN) 100 UNIT/ML FlexPen Inject 4-16 Units into the skin 2 (two) times daily. Inject twice daily based on sliding scale, BG 70-130 = 0 units, 131-180 = 4 units, 181-240 = 8 units, 241-300 = 10 units, 301-350 = 12 units, 351-400 = 16units 01/03/16   Jeanella Craze, NP  Insulin Glargine (LANTUS) 100 UNIT/ML Solostar Pen Inject 57 Units into the skin at bedtime. 01/27/16   Clair[place] N Rumley, DO  Insulin Pen Needle (B-D ULTRAFINE III SHORT PEN) 31G X 8 MM MISC Use as directed 09/17/15   Araceli Bouche, DO  levalbuterol (XOPENEX) 0.63 MG/3ML nebulizer solution One vial in nebulizer four times daily as needed dx 493.00 09/01/13   Storm Frisk, MD  mometasone-formoterol (DULERA) 200-5 MCG/ACT AERO Inhale 2 puffs into  the lungs 2 (two) times daily. 01/03/16   Jeanella Craze, NP  montelukast (SINGULAIR) 10 MG tablet Take 1 tablet (10 mg total) by mouth at bedtime. 01/03/16   Jeanella Craze, NP  NEXIUM 40 MG capsule take 1 capsule by mouth once daily 10/29/15   Roslynn Amble, MD  polyethylene glycol powder (GLYCOLAX/MIRALAX) powder take 17GM (DISSOLVED IN WATER) by mouth once daily THREE TIMES A WEEK if needed as directed 01/21/16   [facility], DO  potassium chloride SA (K-DUR,KLOR-CON) 20 MEQ tablet Take 1 tablet (20 mEq total) by mouth daily as directed. 02/18/16   Mihai Croitoru, MD  rosuvastatin (CRESTOR) 10 MG tablet Take 1 tablet (10 mg total) by mouth every evening. 01/03/16   Jeanella Craze, NP  torsemide (DEMADEX) 20 MG tablet Take 1 tablet (  20 mg total) by mouth 2 (two) times daily as directed. 02/18/16   Mihai Croitoru, MD  warfarin (COUMADIN) 5 MG tablet No coumadin on 9/22 Take 2.5 mg starting on 9/23 daily until seen in the coumadin clinic. 01/03/16   Jeanella CrazeBrandi L Ollis, NP  warfarin (COUMADIN) 5 MG tablet take 1 and 1/2 tablets by mouth once daily 03/02/16   Thurmon FairMihai Croitoru, MD  XOPENEX HFA 45 MCG/ACT inhaler INHALE 1 TO 2 PUFFS BY MOUTH INTO THE LUNGS EVERY 4 HOURS IF NEEDED FOR WHEEZING 11/28/15   Roslynn AmbleJennings E Nestor, MD    Family History Family History  Problem Relation Age of Onset  . Diabetes Father   . Hypertension Father   . Lung disease Neg Hx   . Cancer Neg Hx   . Rheumatologic disease Neg Hx     Social History Social History  Substance Use Topics  . Smoking status: Never Smoker  . Smokeless tobacco: Never Used  . Alcohol use No     Allergies   Avelox [moxifloxacin hcl in nacl]; Simvastatin; Ace inhibitors; and Latex   Review of Systems Review of Systems  Constitutional: Negative for chills and fever.  Musculoskeletal: Positive for arthralgias and joint swelling.  Neurological: Negative for weakness.     Physical Exam Updated Vital Signs BP 127/82 (BP Location: Left  Arm)   Pulse 73   Temp 98.2 F (36.8 C) (Oral)   Resp 18   Ht 5\' 7"  (1.702 m)   Wt 92.5 kg   LMP 07/26/2011   SpO2 100%   BMI 31.95 kg/m   Physical Exam  Constitutional: She is oriented to person, place, and time. She appears well-developed and well-nourished.  HENT:  Head: Normocephalic.  Eyes: EOM are normal.  Neck: Normal range of motion.  Cardiovascular: Regular rhythm and normal heart sounds.   Pulmonary/Chest: Effort normal and breath sounds normal.  Abdominal: She exhibits no distension.  Musculoskeletal: Normal range of motion.  Exquisite tenderness to the R wrist that is indurated and erythematous.  No associated firmness, crepitus, or fluid collection. No wounds noted. Intact sensation. Normal capillary refill.  Neurological: She is alert and oriented to person, place, and time.  Psychiatric: She has a normal mood and affect.  Nursing note and vitals reviewed.    ED Treatments / Results   DIAGNOSTIC STUDIES: Oxygen Saturation is 100% on RA, Normal by my interpretation.    COORDINATION OF CARE: 3:51 AM- Will order imaging. Discussed treatment plan with pt at bedside and pt agreed to plan.     Labs (all labs ordered are listed, but only abnormal results are displayed) Labs Reviewed - No data to display  EKG  EKG Interpretation None       Radiology Dg Wrist Complete Right  Result Date: 03/08/2016 CLINICAL DATA:  65 y/o F; pain redness and swelling of the right wrist for 1 week. EXAM: RIGHT WRIST - COMPLETE 3+ VIEW COMPARISON:  None. FINDINGS: No acute fracture or dislocation is identified. Mild basal joint osteoarthrosis with articular surface sclerosis. Minimal chondrocalcinosis of the TFCC. IMPRESSION: Mild basal joint osteoarthrosis. Chondrocalcinosis of the TFCC. No acute osseous abnormality. Electronically Signed   By: Mitzi HansenLance  Furusawa-Stratton M.D.   On: 03/08/2016 04:43    Procedures Procedures (including critical care time)  Medications Ordered  in ED Medications  oxyCODONE-acetaminophen (PERCOCET/ROXICET) 5-325 MG per tablet 1 tablet (1 tablet Oral Given 03/08/16 0413)  ondansetron (ZOFRAN-ODT) disintegrating tablet 4 mg (4 mg Oral Given 03/08/16 0412)  Initial Impression / Assessment and Plan / ED Course  I have reviewed the triage vital signs and the nursing notes.  Pertinent labs & imaging results that were available during my care of the patient were reviewed by me and considered in my medical decision making (see chart for details).  Clinical Course     Xray of wrist and physical exam are consistent with pseudogout. Given patient comorbidity, I discussed with Dr. Blinda Leatherwood and we feel that Ibuprofen is the safest. Will give wrist splint, Motrin rx and a script for Percocet, 5 tabs. She is to follow up with our hand specialist or her PCP. Pt education given and we discussed return precautions.   Final Clinical Impressions(s) / ED Diagnoses   Final diagnoses:  Pseudogout of wrist, right    New Prescriptions New Prescriptions   No medications on file   I personally performed the services described in this documentation, which was scribed in my presence. The recorded information has been reviewed and is accurate.    Marlon Pel, PA-C 03/08/16 0510    Gilda Crease, MD 03/09/16 [phone]

## 2016-03-08 NOTE — ED Notes (Signed)
Patient to and from X-Ray at this time.

## 2016-03-09 ENCOUNTER — Telehealth: Payer: Self-pay | Admitting: Cardiovascular Disease

## 2016-03-09 ENCOUNTER — Other Ambulatory Visit: Payer: Self-pay | Admitting: Cardiovascular Disease

## 2016-03-09 NOTE — Telephone Encounter (Signed)
REFILL 

## 2016-03-09 NOTE — Telephone Encounter (Signed)
Went to [facility] ER on yesterday,was diagnosed with gout. They gave her 5 Oxycodone and wants her to take Ibuprofen 600 mg. She wants to know if this alright for her to take this medicine along with other medicine and her condition?

## 2016-03-09 NOTE — Telephone Encounter (Signed)
Reviewed med list, okay for patient to take percocet and ibuprofen 600mg .  Suggested she alternate every 6 hours, (percocet bid and ibuprofen bid) and take with food.  Patient voiced understanding.

## 2016-03-09 NOTE — Telephone Encounter (Signed)
Spoke to patient. She has a short course of percocet and ibuprofen prescribed by ED physician for new dx of gout. Wanted to know if OK to take these medications & was concerned for interactions w her other medications. She states she is really in pain but has not taken the medications yet due to concerns. I advised patient that she can take a dose of the percocet now and then the ibuprofen ~3 hrs later, I will have pharmD review & recommend best use & follow up w patient later. She expressed understanding and thanks for call.

## 2016-03-09 NOTE — Telephone Encounter (Signed)
Call back to patient.  She stated she received knee injection and fluid was removed and is having less pain.  Since they she has had gout in her wrist and that has been very painful.  She has some pain medication which is helping some.  She does not feel like she has any fluid accumulation from cardiac standpoint.  ICM remote transmission scheduled for 03/20/2016

## 2016-03-10 DIAGNOSIS — M25531 Pain in right wrist: Secondary | ICD-10-CM | POA: Diagnosis not present

## 2016-03-16 ENCOUNTER — Inpatient Hospital Stay: Admit: 2016-03-16 | Payer: Medicare Other | Admitting: Orthopedic Surgery

## 2016-03-16 SURGERY — ARTHROPLASTY, KNEE, TOTAL
Anesthesia: Spinal | Laterality: Right

## 2016-03-17 ENCOUNTER — Ambulatory Visit (INDEPENDENT_AMBULATORY_CARE_PROVIDER_SITE_OTHER): Payer: Medicare Other | Admitting: Pharmacist Clinician (PhC)/ Clinical Pharmacy Specialist

## 2016-03-17 DIAGNOSIS — Z7901 Long term (current) use of anticoagulants: Secondary | ICD-10-CM

## 2016-03-17 DIAGNOSIS — I4891 Unspecified atrial fibrillation: Secondary | ICD-10-CM | POA: Diagnosis not present

## 2016-03-17 LAB — POCT INR: INR: 4.4

## 2016-03-20 ENCOUNTER — Ambulatory Visit (INDEPENDENT_AMBULATORY_CARE_PROVIDER_SITE_OTHER): Payer: Medicare Other

## 2016-03-20 DIAGNOSIS — Z95810 Presence of automatic (implantable) cardiac defibrillator: Secondary | ICD-10-CM

## 2016-03-20 DIAGNOSIS — I5022 Chronic systolic (congestive) heart failure: Secondary | ICD-10-CM

## 2016-03-20 NOTE — Progress Notes (Signed)
EPIC Encounter for ICM Monitoring  Patient Name: [name] is a 65 y.o. female Date: 03/20/2016 Primary Care Physican: Garry Heater, DO Primary Cardiologist:Croitoru Electrophysiologist: Croitoru Dry Weight: 211 lbs                                          Heart Failure questions reviewed, pt asymptomatic   Thoracic impedance normal   Recommendations: No changes.  Reinforced to limit low salt food choices to 2000 mg day and limiting fluid intake to < 2 liters per day. Encouraged to call for fluid symptoms.    Follow-up plan: ICM clinic phone appointment on 04/21/2015.  Copy of ICM check sent to device physician.   ICM trend: 03/20/2016       [name], RN 03/20/2016 12:05 PM

## 2016-03-22 ENCOUNTER — Encounter: Payer: Self-pay | Admitting: Pulmonary Disease

## 2016-03-23 ENCOUNTER — Other Ambulatory Visit: Payer: Self-pay | Admitting: Family Medicine

## 2016-03-24 ENCOUNTER — Encounter: Payer: Self-pay | Admitting: Pulmonary Disease

## 2016-03-24 ENCOUNTER — Other Ambulatory Visit: Payer: Self-pay | Admitting: Pulmonary Disease

## 2016-03-24 ENCOUNTER — Other Ambulatory Visit (INDEPENDENT_AMBULATORY_CARE_PROVIDER_SITE_OTHER): Payer: Medicare Other

## 2016-03-24 ENCOUNTER — Ambulatory Visit (INDEPENDENT_AMBULATORY_CARE_PROVIDER_SITE_OTHER): Payer: Medicare Other | Admitting: Pulmonary Disease

## 2016-03-24 VITALS — BP 124/70 | HR 77 | Ht 66.0 in | Wt 206.0 lb

## 2016-03-24 DIAGNOSIS — J4540 Moderate persistent asthma, uncomplicated: Secondary | ICD-10-CM

## 2016-03-24 DIAGNOSIS — Z9989 Dependence on other enabling machines and devices: Secondary | ICD-10-CM | POA: Diagnosis not present

## 2016-03-24 DIAGNOSIS — G4733 Obstructive sleep apnea (adult) (pediatric): Secondary | ICD-10-CM | POA: Diagnosis not present

## 2016-03-24 DIAGNOSIS — K219 Gastro-esophageal reflux disease without esophagitis: Secondary | ICD-10-CM

## 2016-03-24 LAB — CBC WITH DIFFERENTIAL/PLATELET
Basophils Absolute: 0 10*3/uL (ref 0.0–0.1)
Basophils Relative: 0.4 % (ref 0.0–3.0)
EOS PCT: 3.5 % (ref 0.0–5.0)
Eosinophils Absolute: 0.2 10*3/uL (ref 0.0–0.7)
HCT: 36.9 % (ref 36.0–46.0)
Hemoglobin: 12.1 g/dL (ref 12.0–15.0)
LYMPHS ABS: 2 10*3/uL (ref 0.7–4.0)
Lymphocytes Relative: 33.2 % (ref 12.0–46.0)
MCHC: 32.8 g/dL (ref 30.0–36.0)
MCV: 87.9 fl (ref 78.0–100.0)
MONOS PCT: 8.5 % (ref 3.0–12.0)
Monocytes Absolute: 0.5 10*3/uL (ref 0.1–1.0)
NEUTROS PCT: 54.4 % (ref 43.0–77.0)
Neutro Abs: 3.3 10*3/uL (ref 1.4–7.7)
Platelets: 213 10*3/uL (ref 150.0–400.0)
RBC: 4.19 Mil/uL (ref 3.87–5.11)
RDW: 14.1 % (ref 11.5–15.5)
WBC: 6.2 10*3/uL (ref 4.0–10.5)

## 2016-03-24 NOTE — Patient Instructions (Addendum)
   Continue using your Dulera and Singulair.  Think about if you would like to switch from Nexium over to a medicine like Zantac or Pepcid.  Call me if you have any new breathing problems before your next appointment.  TESTS ORDERED: 1. Serum CBC w/ differential & RAST Panel today 2. Full PFTs at next appointment

## 2016-03-24 NOTE — Progress Notes (Signed)
Subjective:    Patient ID: [name], female    DOB: 12/13/1950, 65 y.o.   MRN: [id number]  C.C.:  Follow-up for Moderate, Persistent Asthma, GERD, & OSA.  HPI Moderate, Persistent Asthma:  Previously on Pulmicort. Currently prescribed Dulera & Singulair. Reports no exacerbations since last appointment. She reports she has infrequently needed her rescue inhaler. No coughing or wheezing. No nocturnal awakenings with any breathing problems.   GERD:  Prescribed Nexium.  No reflux or dyspepsia. No dysphagia or odynophagia. No morning brash water taste.  OSA:  On CPAP therapy. She does nap occasionally during the day but sometimes forgets her CPAP. She reports she cannot sleep without the CPAP because her sleep is so restful.   Review of Systems No fever, chills, or sweats. No chest pain, tightness or pressure. No headaches or vision changes.   Allergies  Allergen Reactions  . Avelox [Moxifloxacin Hcl In Nacl] Other (See Comments)    Cardiac arrest per pt  . Simvastatin Other (See Comments)    myalgias  . Ace Inhibitors Cough    Dry cough   . Latex Rash    Current Outpatient Prescriptions on File Prior to Visit  Medication Sig Dispense Refill  . B-D ULTRAFINE III SHORT PEN 31G X 8 MM MISC use as directed 100 each 9  . bisoprolol (ZEBETA) 5 MG tablet Take 2 tablets (10 mg total) by mouth daily. 60 tablet 11  . budesonide-formoterol (SYMBICORT) 160-4.5 MCG/ACT inhaler Inhale 2 puffs into the lungs 2 (two) times daily. 1 Inhaler 6  . buPROPion (WELLBUTRIN XL) 150 MG 24 hr tablet take 1 tablet by mouth once daily 90 tablet 3  . chlorpheniramine (CHLOR-TRIMETON) 4 MG tablet 8mg  at night 14 tablet 0  . dofetilide (TIKOSYN) 250 MCG capsule Take 250 mcg by mouth 2 (two) times daily. Reported on 09/26/2015    . fluticasone (FLONASE) 50 MCG/ACT nasal spray instill 1 spray into each nostril twice a day 16 g 5  . insulin aspart (NOVOLOG FLEXPEN) 100 UNIT/ML FlexPen Inject 4-16 Units into the  skin 2 (two) times daily. Inject twice daily based on sliding scale, BG 70-130 = 0 units, 131-180 = 4 units, 181-240 = 8 units, 241-300 = 10 units, 301-350 = 12 units, 351-400 = 16units    . Insulin Glargine (LANTUS) 100 UNIT/ML Solostar Pen Inject 57 Units into the skin at bedtime. 15 mL 12  . Insulin Pen Needle (B-D ULTRAFINE III SHORT PEN) 31G X 8 MM MISC Use as directed 100 each 9  . levalbuterol (XOPENEX) 0.63 MG/3ML nebulizer solution One vial in nebulizer four times daily as needed dx 493.00 360 mL 4  . mometasone-formoterol (DULERA) 200-5 MCG/ACT AERO Inhale 2 puffs into the lungs 2 (two) times daily. 1 Inhaler 6  . montelukast (SINGULAIR) 10 MG tablet Take 1 tablet (10 mg total) by mouth at bedtime. 30 tablet 6  . NEXIUM 40 MG capsule take 1 capsule by mouth once daily 30 capsule 5  . polyethylene glycol powder (GLYCOLAX/MIRALAX) powder take 17GM (DISSOLVED IN WATER) by mouth once daily THREE TIMES A WEEK if needed as directed 500 g 3  . potassium chloride SA (K-DUR,KLOR-CON) 20 MEQ tablet Take 1 tablet (20 mEq total) by mouth daily as directed. 30 tablet 11  . rosuvastatin (CRESTOR) 10 MG tablet Take 1 tablet (10 mg total) by mouth daily. KEEP OV. 90 tablet 0  . torsemide (DEMADEX) 20 MG tablet Take 1 tablet (20 mg total) by mouth 2 (  two) times daily as directed. 60 tablet 11  . warfarin (COUMADIN) 5 MG tablet No coumadin on 9/22 Take 2.5 mg starting on 9/23 daily until seen in the coumadin clinic. 45 tablet 5  . warfarin (COUMADIN) 5 MG tablet take 1 and 1/2 tablets by mouth once daily 45 tablet 5  . XOPENEX HFA 45 MCG/ACT inhaler INHALE 1 TO 2 PUFFS BY MOUTH INTO THE LUNGS EVERY 4 HOURS IF NEEDED FOR WHEEZING 15 Inhaler 5  . ibuprofen (ADVIL,MOTRIN) 600 MG tablet Take 1 tablet (600 mg total) by mouth every 6 (six) hours as needed. (Patient not taking: Reported on 03/24/2016) 30 tablet 0  . oxyCODONE-acetaminophen (PERCOCET/ROXICET) 5-325 MG tablet Take 1 tablet by mouth every 6 (six)  hours as needed for severe pain. (Patient not taking: Reported on 03/24/2016) 5 tablet 0   No current facility-administered medications on file prior to visit.     Past Medical History:  Diagnosis Date  . AICD (automatic cardioverter/defibrillator) present    Medtronic  . Anemia   . Anxiety   . Arthritis   . Asthma    has had multiple hospitalizations for this  . Atrial fibrillation (HCC)    ablation x 2 [facility], 01/2006, 2011.  on warfarin  . Cardiac arrest [facility]) jan 2012   in hospital for pneumonia when this occured- occured at the hospital  . CHF (congestive heart failure) (HCC) 2012   Echo 08/08/10 by [facility]. EF 35-45%. LV systolic function moderately reduced. Moderate global hypokinesis of LV.  RV systolic function moderately reduced. Mild MR. Trace AR.  Marland Kitchen Complication of anesthesia    difficult time waking up after anesthesia  . Depression   . Diabetes mellitus    Type 2  . Dysrhythmia    Atrial fibrillation  . GASTROESOPHAGEAL REFLUX, NO ESOPHAGITIS 06/10/2006   Qualifier: Diagnosis of  By: Abundio Miu    . GERD (gastroesophageal reflux disease)   . History of hiatal hernia   . Hyperlipidemia   . Hypertension   . Limb pain 06/04/2008   LLE, Baker's cyst in popliteal fossa, no DVT  . Non-ischemic cardiomyopathy (HCC)    echo 08/08/10 - EF 35-45% LV and RV systolic function mod reduced  . Primary localized osteoarthritis of left knee   . Primary localized osteoarthritis of right knee   . Primary localized osteoarthritis of right knee   . Sleep apnea    wears CPAP nightly  . Vocal cord disease   . Wears glasses     Past Surgical History:  Procedure Laterality Date  . BREAST LUMPECTOMY Right   . CARDIAC DEFIBRILLATOR PLACEMENT  05/02/10   Medtronic Protecta XT-DR for CHF-VT, last download 04/12/12  . CHOLECYSTECTOMY    . COLONOSCOPY    . HAMMER TOE SURGERY Right   . KNEE ARTHROSCOPY Right   . PAF Ablation     By Dr Sampson Goon. Now sees Dr Steele Berg at  [facility]  . TOTAL KNEE ARTHROPLASTY Left 09/17/2014  . TOTAL KNEE ARTHROPLASTY Left 09/17/2014   Procedure: TOTAL KNEE ARTHROPLASTY;  Surgeon: Salvatore Marvel, MD;  Location: [facility] OR;  Service: Orthopedics;  Laterality: Left;  pt has ICD  . TUBAL LIGATION      Family History  Problem Relation Age of Onset  . Diabetes Father   . Hypertension Father   . Lung disease Neg Hx   . Cancer Neg Hx   . Rheumatologic disease Neg Hx     Social History   Social History  .  Marital status: Widowed    Spouse name: N/A  . Number of children: N/A  . Years of education: N/A   Social History Main Topics  . Smoking status: Never Smoker  . Smokeless tobacco: Never Used  . Alcohol use No  . Drug use: No  . Sexual activity: No   Other Topics Concern  . None   Social History Narrative   Not employed   Exercise- walking 1 hour daily.    Diet- eating healthy diet.       [facility]:   She is originally from [place] [facility] [place]. Has always lived in [place]. 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 Currently has a small dog. No bird or mold exposure.       Objective:   Physical Exam  BP 124/70 (Cuff Size: Normal)   Pulse 77   Ht 5\' 6"  (1.676 m)   Wt 206 lb (93.4 kg)   LMP 07/26/2011   SpO2 98%   BMI 33.25 kg/m  General:  Awake. No distress. Calm. Integument:  Warm & dry. No rash on exposed skin.  HEENT:  Moist mucus membranes. No scleral injection. Moderate bilateral nasal turbinate swelling with pale mucosa. Cardiovascular:  Regular rate. No edema. Normal S1 & S2. Pulmonary: Good aeration bilaterally. Clear to auscultation. Normal work of breathing on room air. Abdomen: Soft. Mildly protuberant. Nontender.  PFT 11/04/15: FVC 2.95 L (102%) FEV1 2.10 L (93%) FEV1/FVC 0.71 FEF 25-75 1.37 L (64%) no bronchodilator response TLC 6.02 L (109%) RV 109% ERV 47% DLCO uncorrected 75% (hgb 12.7) 12/06/08: FVC  3.43 L (99%) FEV1 2.46 L (94%) FEV1/FVC 0.72 FEF 25-75 1.59 L (85%) no bronchodilator response TLC 6.17 L (112%) RV 131% ERV 52% DLCO uncorrected 74%  IMAGING PORT CXR 12/31/15 (previously reviewed by me):  No focal opacity, mass, or effusion appreciated.  CXR PA/LAT 04/22/15 (previously reviewed by me): Tenting of left hemidiaphragm. No focal opacity or effusion appreciated. Heart normal in size & mediastinum normal in contour.  CXR PA/LAT 09/19/14 (previously reviewed by me): Questionable right basilar density/opacity. No pleural effusion appreciated. Heart normal in size. Mediastinum normal in contour.  LABS 01/08/16 CBC: 12.7/13.3/40.2/220 Eosinophils: 0 (on Prednisone)    Assessment & Plan:  65 y.o. female with underlying moderate, persistent asthma, GERD, & OSA. Patient seems to have recovered well from her previous asthma exacerbation. Asthma seems to be well-controlled at this time. I am ordering serum testing to ensure we have additional treatment options available for the patient if necessary. She will continue on her current inhaler and possibly be able to de-escalate inhaled steroid therapy at follow-up appointment. We also discussed switching from a proton pump inhibitor medication to an H2 blocker for control of her underlying reflux. I cautioned the patient and her daughter that uncontrolled reflux can contribute to uncontrolled asthma. I instructed the patient contact my office if she had any new breathing problems or questions before next appointment.  1. Moderate, Persistent Asthma: Continuing Dulera 200 & Singulair.  Checking CBC with differential & RAST panel today. 2. GERD: Asymptomatic. Patient contemplating switching from Nexium to Zantac or Pepcid. 3. OSA: Continuing CPAP indefinitely. 4. Health Maintenance:  S/P Influenza August 2017,  Pneumovax 02/22/12, Tdap November 2013, & Prevnar 2015.  5. Follow-up:  Return to clinic in 3 months or sooner if needed.  Donna Christen  Jamison Neighbor, M.D. [facility] Pager:  [phone] After 3pm or if no response, call [phone] 9:20 AM 03/24/16

## 2016-03-25 ENCOUNTER — Ambulatory Visit (INDEPENDENT_AMBULATORY_CARE_PROVIDER_SITE_OTHER): Payer: Medicare Other | Admitting: Podiatry

## 2016-03-25 ENCOUNTER — Encounter: Payer: Self-pay | Admitting: Podiatry

## 2016-03-25 VITALS — BP 136/79 | HR 88 | Resp 18

## 2016-03-25 DIAGNOSIS — M79609 Pain in unspecified limb: Secondary | ICD-10-CM | POA: Diagnosis not present

## 2016-03-25 DIAGNOSIS — E0842 Diabetes mellitus due to underlying condition with diabetic polyneuropathy: Secondary | ICD-10-CM

## 2016-03-25 DIAGNOSIS — Z794 Long term (current) use of insulin: Secondary | ICD-10-CM

## 2016-03-25 DIAGNOSIS — E1140 Type 2 diabetes mellitus with diabetic neuropathy, unspecified: Secondary | ICD-10-CM

## 2016-03-25 DIAGNOSIS — L84 Corns and callosities: Secondary | ICD-10-CM

## 2016-03-25 DIAGNOSIS — Q828 Other specified congenital malformations of skin: Secondary | ICD-10-CM

## 2016-03-25 DIAGNOSIS — B351 Tinea unguium: Secondary | ICD-10-CM

## 2016-03-25 LAB — RESPIRATORY ALLERGY PROFILE REGION II ~~LOC~~
ALLERGEN, COMM SILVER BIRCH, T3: 0.33 kU/L — AB
Allergen, A. alternata, m6: <0.1 kU/L
Allergen, Cedar tree, t12: <0.1 kU/L
Allergen, Cottonwood, t14: <0.1 kU/L
Allergen, D pternoyssinus,d7: <0.1 kU/L
Allergen, Mouse Urine Protein, e78: <0.1 kU/L
Allergen, Oak,t7: 0.47 kU/L — ABNORMAL HIGH
BOX ELDER: 0.18 kU/L — AB
Bermuda Grass: 0.11 kU/L — ABNORMAL HIGH
COMMON RAGWEED: 0.28 kU/L — AB
Cat Dander: <0.1 kU/L
D. farinae: <0.1 kU/L
Dog Dander: <0.1 kU/L
Elm IgE: 0.31 kU/L — ABNORMAL HIGH
IGE (IMMUNOGLOBULIN E), SERUM: 68 kU/L (ref <115)
Johnson Grass: <0.1 kU/L
Pecan/Hickory Tree IgE: <0.1 kU/L
ROUGH PIGWEED IGE: 0.16 kU/L — AB
Timothy Grass: <0.1 kU/L

## 2016-03-25 NOTE — Progress Notes (Signed)
   Subjective:    Patient ID: [name], female    DOB: 13-Aug-1950, 65 y.o.   MRN: [id number]  HPI   I need my toenails and calluses trimmed up and I was in the hospital back in September for asthma and I had fluid drained from my right knee a while back and now my right hand is hurting and I have an appointment tomorrow for that    Review of Systems  All other systems reviewed and are negative.      Objective:   Physical Exam        Assessment & Plan:

## 2016-03-25 NOTE — Progress Notes (Signed)
Patient ID: [name], female   DOB: [birth year], 65 y.o.   MRN: [id number]   Subjective: Patient presents for scheduled visit complaining of uncomfortable toenails walking wearing shoes and painful corns and calluses on the right and left feet that are uncomfortable with shoe wearing walking as well Patient is diabetic denies any history of ulceration, claudication or amputation Denies history of smoking  Objective: Orientated 3 DP pulses 2/4 bilaterally PT pulses trace palpable bilaterally Capillary reflex immediate bilaterally Sensation to 10 g monofilament wire intact 4/5 right 5/5 left Vibratory sensation reactive bilaterally Ankle reflex equal and reactive bilaterally Open skin lesions bilaterally Toenails are hypertrophic, elongated, deformed and tender to direct palpation 6-10 Keratoses distal fourth right toe, medial right hallux, plantar second MPJ right, plantar left hallux, distal third left toe, fifth MPJ HAV bilaterally Bunionette's bilaterally  Assessment: Diabetic with decreased pedal pulses Diabetic with a history of peripheral arterial disease and peripheral neuropathy Protective sensation intact bilaterally Symptomatic onychomycoses 6-10 Multiple keratoses  Plan: Debridement of toenails 6-10 mechanically and alert without a bleeding Debride multiple keratoses without any bleeding  Reappoint 3 months

## 2016-03-25 NOTE — Patient Instructions (Signed)

## 2016-03-26 DIAGNOSIS — M1810 Unilateral primary osteoarthritis of first carpometacarpal joint, unspecified hand: Secondary | ICD-10-CM | POA: Insufficient documentation

## 2016-03-26 DIAGNOSIS — M10031 Idiopathic gout, right wrist: Secondary | ICD-10-CM | POA: Insufficient documentation

## 2016-03-26 HISTORY — DX: Unilateral primary osteoarthritis of first carpometacarpal joint, unspecified hand: M18.10

## 2016-03-27 ENCOUNTER — Ambulatory Visit: Payer: Medicare Other | Admitting: Podiatry

## 2016-03-27 ENCOUNTER — Ambulatory Visit (INDEPENDENT_AMBULATORY_CARE_PROVIDER_SITE_OTHER): Payer: Medicare Other | Admitting: Pharmacist

## 2016-03-27 DIAGNOSIS — Z7901 Long term (current) use of anticoagulants: Secondary | ICD-10-CM | POA: Diagnosis not present

## 2016-03-27 DIAGNOSIS — I4891 Unspecified atrial fibrillation: Secondary | ICD-10-CM

## 2016-03-27 LAB — POCT INR: INR: 2.2

## 2016-03-31 ENCOUNTER — Telehealth: Payer: Self-pay | Admitting: Pulmonary Disease

## 2016-03-31 NOTE — Telephone Encounter (Signed)
CPAP DOWNLOAD 02/22/16 - 03/22/16: 100% usage for over 4 hours. Average usage 7 hours 49 minutes. CPAP 8 cm H2O pressure. 10.3 L prevent leaks. AHI 2.3 events per hour.

## 2016-04-02 ENCOUNTER — Telehealth: Payer: Self-pay

## 2016-04-02 NOTE — Telephone Encounter (Signed)
Request for surgical clearance:   1. What type surgery is being performed? Right wrist arthroscopy with debridement and injection Right thumb CMC joint  2. When is this surgery scheduled? 05/01/2016  3. Are there any medications that need to be held prior to surgery and how long? Pt is taking Warfarin 5 mg daily per coumadin clinic  4. Name of the physician performing surgery: Dr Dairl Ponder  5. What is the office phone and fax number?   Phone [phone]  Fax [phone]

## 2016-04-09 ENCOUNTER — Encounter: Payer: Self-pay | Admitting: Cardiovascular Disease

## 2016-04-09 NOTE — Telephone Encounter (Signed)
Sent via [employer]. Bridging not required

## 2016-04-09 NOTE — Telephone Encounter (Signed)
Clearance routed via EPIC 

## 2016-04-15 ENCOUNTER — Telehealth: Payer: Self-pay | Admitting: Family Medicine

## 2016-04-15 NOTE — Telephone Encounter (Signed)
Pt called and will be out of all her diabetic supplies. She said that her mail order pharmacy has been faxing [place] for about 2 weeks now and we have not responded. Can we get this sent in since she only has about 2 weeks left and it takes about that same time to get this delivered. jw

## 2016-04-16 MED ORDER — INSULIN PEN NEEDLE 31G X 8 MM MISC: 9 refills | Status: DC

## 2016-04-16 NOTE — Telephone Encounter (Signed)
Have not received any prior requests for diabetes supplies. Pen Needles refilled and sent to pharmacy to deliver to her home. If further supplies are needed, please tell her to let us know.

## 2016-04-16 NOTE — Telephone Encounter (Signed)
Informed patient that diabetic supplies were refilled and if she needed any other supplies let us know. Maryjean Morn, CMA

## 2016-04-20 ENCOUNTER — Ambulatory Visit (INDEPENDENT_AMBULATORY_CARE_PROVIDER_SITE_OTHER): Payer: Medicare Other

## 2016-04-20 DIAGNOSIS — Z95810 Presence of automatic (implantable) cardiac defibrillator: Secondary | ICD-10-CM

## 2016-04-20 DIAGNOSIS — I5022 Chronic systolic (congestive) heart failure: Secondary | ICD-10-CM | POA: Diagnosis not present

## 2016-04-21 ENCOUNTER — Ambulatory Visit (INDEPENDENT_AMBULATORY_CARE_PROVIDER_SITE_OTHER): Payer: Medicare Other | Admitting: Family Medicine

## 2016-04-21 ENCOUNTER — Encounter: Payer: Self-pay | Admitting: Family Medicine

## 2016-04-21 VITALS — BP 122/78 | HR 90 | Temp 98.3°F | Ht 66.0 in | Wt 206.2 lb

## 2016-04-21 DIAGNOSIS — E1140 Type 2 diabetes mellitus with diabetic neuropathy, unspecified: Secondary | ICD-10-CM | POA: Diagnosis not present

## 2016-04-21 DIAGNOSIS — E1165 Type 2 diabetes mellitus with hyperglycemia: Secondary | ICD-10-CM | POA: Diagnosis not present

## 2016-04-21 DIAGNOSIS — IMO0002 Reserved for concepts with insufficient information to code with codable children: Secondary | ICD-10-CM

## 2016-04-21 LAB — LIPID PANEL
Cholesterol: 106 mg/dL (ref <200)
HDL: 44 mg/dL — ABNORMAL LOW (ref >50)
LDL Cholesterol: 47 mg/dL (ref <100)
TRIGLYCERIDES: 73 mg/dL (ref <150)
Total CHOL/HDL Ratio: 2.4 Ratio (ref <5.0)
VLDL: 15 mg/dL (ref <30)

## 2016-04-21 LAB — BASIC METABOLIC PANEL
BUN: 14 mg/dL (ref 7–25)
CO2: 27 mmol/L (ref 20–31)
Calcium: 8.3 mg/dL — ABNORMAL LOW (ref 8.6–10.4)
Chloride: 103 mmol/L (ref 98–110)
Creat: 1.11 mg/dL — ABNORMAL HIGH (ref 0.50–0.99)
GLUCOSE: 168 mg/dL — AB (ref 65–99)
POTASSIUM: 4 mmol/L (ref 3.5–5.3)
Sodium: 139 mmol/L (ref 135–146)

## 2016-04-21 LAB — POCT GLYCOSYLATED HEMOGLOBIN (HGB A1C): Hemoglobin A1C: 8.2

## 2016-04-21 NOTE — Progress Notes (Signed)
EPIC Encounter for ICM Monitoring  Patient Name: [name] is a 67 y.o. female Date: 04/21/2016 Primary Care Physican: Garry Heater, DO Primary Cardiologist:Croitoru Electrophysiologist: [place] Dry Weight:unknown      Clinical Status (20-Mar-2016 to 20-Apr-2016) AT/AF 139 episodes Time in AT/AF 0.5 hr/day (1.9%)   Attempted ICM call and unable to reach.  Left detailed message regarding transmission.  Transmission reviewed.   Thoracic impedance close to baseline.   Recommendations: Not reached    Follow-up plan: ICM clinic phone appointment on 06/23/2016.  Office visit with Dr Royann Shivers on 05/21/2016  Copy of ICM check sent to cardiologist.   3 month ICM trend : 04/20/2016   1 Year ICM trend:      [name], RN 04/21/2016 9:45 AM

## 2016-04-21 NOTE — Progress Notes (Signed)
Subjective:     Patient ID: [name], female   DOB: [birth year], 66 y.o.   MRN: [id number]  HPI [name] is a 66yo female presenting for diabetes management. Reports she is taking Lantus 57units and a Novolog sliding scale with breakfast and supper. Has switched delivery company for her diabetes supplies; has one week of diabetes supplies left ([facility]: [phone]). Checks her blood sugar before breakfast and after supper. Range of 69-148 noted before breakfast with average of 110-120. Range of 132-180 after supper with average of 150. Does not wish to change her diabetes regimen at this time. Has not followed up with Pharmacy as recommended since she has not had time with multiple doctors visits lately. Notes on 1/19 she is going for orthopedic surgery on her wrist--right wrist arthroscopy and debridement with injection of the right thumb CMC joint.   Review of Systems Per HPI    Objective:   Physical Exam  Constitutional: She appears well-developed. No distress.  HENT:  Head: Normocephalic and atraumatic.  Cardiovascular: Normal rate and regular rhythm.   No murmur heard. Pedal pulses palpable bilaterally  Pulmonary/Chest: Effort normal. No respiratory distress. She has no wheezes.  Abdominal: Soft. She exhibits no distension. There is no tenderness.  Neurological:  Slightly decreased sensation over feet bilaterally   Skin: No rash noted.  Callous formation on feet bilaterally  Psychiatric: She has a normal mood and affect. Her behavior is normal.      Assessment and Plan:     Type 2 diabetes, uncontrolled, with neuropathy (HCC) Again [name] is reluctant to change her diabetes regimen--this has been problematic in the past but she is showing some improvement from her last office visit. A1C is 8.2 today, down from 9.1 at last office visit. Her current goal is less than 8, which is needed before she can proceed with knee replacement. Will continue Lantus 57 units and Sliding  Scale insulin. Recommended using her sliding scale three times daily with meals, but she again states she only wishes to use it twice daily as she has been doing. Given improvement, we will try the current regimen for another three months. Recheck A1C in 67months and if no improvement agrees to change insulin regimen again. Will check BMP and Lipid Panel today.

## 2016-04-21 NOTE — Assessment & Plan Note (Addendum)
Again [name] is reluctant to change her diabetes regimen--this has been problematic in the past but she is showing some improvement from her last office visit. A1C is 8.2 today, down from 9.1 at last office visit. Her current goal is less than 8, which is needed before she can proceed with knee replacement. Will continue Lantus 57 units and Sliding Scale insulin. Recommended using her sliding scale three times daily with meals, but she again states she only wishes to use it twice daily as she has been doing. Given improvement, we will try the current regimen for another three months. Recheck A1C in 48months and if no improvement agrees to change insulin regimen again. Will check BMP and Lipid Panel today.

## 2016-04-21 NOTE — Patient Instructions (Addendum)
Thank you so much for coming to visit today! Your A1C was 8.2%, very close to your goal of less than 8%! Please continue Lantus 57units and your sliding scale with breakfast and supper. You may also consider adding your sliding scale to lunch time. Please consider following up with our Pharmacist. Please return in 3 months for your next A1C.   Good luck on your surgery! Please make sure your Warfarin is addressed so you know how to take it before surgery!   Dr. Caroleen Hamman

## 2016-04-23 NOTE — Progress Notes (Signed)
Patient returned call.  She stated she is feeling good right now and denied any fluid symptoms.  She is having hand surgery 05/01/2016 as outpatient.  No changes today and reminded her to follow low salt diet.    Follow-up plan: ICM clinic phone appointment on 06/23/2016.  Office visit with Dr Royann Shivers on 05/21/2016

## 2016-04-24 ENCOUNTER — Telehealth: Payer: Self-pay

## 2016-04-24 ENCOUNTER — Ambulatory Visit (INDEPENDENT_AMBULATORY_CARE_PROVIDER_SITE_OTHER): Payer: Medicare Other | Admitting: Pharmacist

## 2016-04-24 ENCOUNTER — Encounter: Payer: Self-pay | Admitting: Cardiovascular Disease

## 2016-04-24 DIAGNOSIS — I4891 Unspecified atrial fibrillation: Secondary | ICD-10-CM | POA: Diagnosis not present

## 2016-04-24 DIAGNOSIS — Z7901 Long term (current) use of anticoagulants: Secondary | ICD-10-CM | POA: Diagnosis not present

## 2016-04-24 LAB — POCT INR: INR: 3.2

## 2016-04-24 NOTE — Telephone Encounter (Signed)
Request for surgical clearance:   1. What type surgery is being performed? Right wrist arthroscopy with debridement and injection right thumb cmc joint  2. When is this surgery scheduled? 05/01/2016  3. Are there any medications that need to be held prior to surgery and how long? N/A; pt takes warfarin 5 mg  4. Name of the physician performing surgery: Dr Dairl Ponder  5. What is the office phone and fax number?   Phone [phone]  Fax [phone]

## 2016-04-24 NOTE — Telephone Encounter (Signed)
Sent via epic 

## 2016-05-01 DIAGNOSIS — M1811 Unilateral primary osteoarthritis of first carpometacarpal joint, right hand: Secondary | ICD-10-CM | POA: Diagnosis not present

## 2016-05-01 DIAGNOSIS — M1A0310 Idiopathic chronic gout, right wrist, without tophus (tophi): Secondary | ICD-10-CM | POA: Diagnosis not present

## 2016-05-01 DIAGNOSIS — Y999 Unspecified external cause status: Secondary | ICD-10-CM | POA: Diagnosis not present

## 2016-05-01 DIAGNOSIS — S63511A Sprain of carpal joint of right wrist, initial encounter: Secondary | ICD-10-CM | POA: Diagnosis not present

## 2016-05-01 DIAGNOSIS — S63591A Other specified sprain of right wrist, initial encounter: Secondary | ICD-10-CM | POA: Diagnosis not present

## 2016-05-02 LAB — CUP PACEART INCLINIC DEVICE CHECK
Brady Statistic AP VP Percent: 0.72 %
Brady Statistic AP VS Percent: 95.97 %
Brady Statistic AS VP Percent: 0.02 %
Brady Statistic AS VS Percent: 3.29 %
HIGH POWER IMPEDANCE MEASURED VALUE: 72 Ohm
HighPow Impedance: 399 Ohm
Implantable Lead Implant Date: 20120120
Implantable Lead Location: 753859
Implantable Lead Model: 6935
Lead Channel Impedance Value: 418 Ohm
Lead Channel Impedance Value: 456 Ohm
Lead Channel Pacing Threshold Amplitude: 0.375 V
Lead Channel Pacing Threshold Amplitude: 0.625 V
Lead Channel Pacing Threshold Pulse Width: 0.4 ms
Lead Channel Sensing Intrinsic Amplitude: 1.75 mV
Lead Channel Sensing Intrinsic Amplitude: 14 mV
Lead Channel Sensing Intrinsic Amplitude: 14 mV
Lead Channel Setting Pacing Amplitude: 2.5 V
Lead Channel Setting Pacing Pulse Width: 0.4 ms
Lead Channel Setting Sensing Sensitivity: 0.3 mV
MDC IDC LEAD IMPLANT DT: 20120120
MDC IDC LEAD LOCATION: 753860
MDC IDC MSMT BATTERY VOLTAGE: 2.82 V
MDC IDC MSMT LEADCHNL RA PACING THRESHOLD PULSEWIDTH: 0.4 ms
MDC IDC MSMT LEADCHNL RA SENSING INTR AMPL: 1.75 mV
MDC IDC PG IMPLANT DT: 20120120
MDC IDC SESS DTM: 20171107141539
MDC IDC SET LEADCHNL RA PACING AMPLITUDE: 2 V
MDC IDC STAT BRADY RA PERCENT PACED: 95.27 %
MDC IDC STAT BRADY RV PERCENT PACED: 0.85 %

## 2016-05-04 ENCOUNTER — Telehealth: Payer: Self-pay | Admitting: *Deleted

## 2016-05-04 NOTE — Telephone Encounter (Signed)
Pt daughter calling stating that NPI number sent to [facility], wouldn't work. Pt is out of her diabetic supplies. Wants to be informed when it is done. There number is [phone]. Deseree Bruna Potter, CMA

## 2016-05-05 ENCOUNTER — Other Ambulatory Visit: Payer: Self-pay | Admitting: Family Medicine

## 2016-05-05 DIAGNOSIS — IMO0002 Reserved for concepts with insufficient information to code with codable children: Secondary | ICD-10-CM

## 2016-05-05 DIAGNOSIS — E1140 Type 2 diabetes mellitus with diabetic neuropathy, unspecified: Secondary | ICD-10-CM

## 2016-05-05 DIAGNOSIS — E1165 Type 2 diabetes mellitus with hyperglycemia: Principal | ICD-10-CM

## 2016-05-05 MED ORDER — INSULIN PEN NEEDLE 31G X 8 MM MISC: 9 refills | Status: DC

## 2016-05-05 NOTE — Telephone Encounter (Signed)
Contacted pt to let her know that we resent the order and I called to see if that worked and they said the NPI number did not so I gave them Dr. Marcelina Morel NPI and it worked.  Informed pt that she should now be able to get her supplies. Lamonte Sakai, Lekeisha Arenas D, [place]

## 2016-05-05 NOTE — Telephone Encounter (Signed)
I have resent Rx with my information.  If there is still an issue, please let me know and I will see if an attending can try.

## 2016-05-06 NOTE — Telephone Encounter (Signed)
Contacted pt to inform her that I had been in contact with [facility] and found out that they had faxed over a form to be completed with the doctor name and npi # that will be signing for this. Dr. Donnetta Hail NPI is the number that would complete this so I will get her to complete form and fax it back ASAP so pt can get her supplies.  Will contact pt tomorrow to update her on the status of this. Lamonte Sakai, April D, [place]

## 2016-05-06 NOTE — Telephone Encounter (Signed)
Pt called and asked for April. She says she was told "that didn't work yesterday" and she needs it done again. Please call pt

## 2016-05-07 ENCOUNTER — Other Ambulatory Visit: Payer: Self-pay | Admitting: Family Medicine

## 2016-05-07 DIAGNOSIS — Z1231 Encounter for screening mammogram for malignant neoplasm of breast: Secondary | ICD-10-CM

## 2016-05-07 NOTE — Telephone Encounter (Signed)
Contacted pt and informed her that I did contact [facility] today to be sure that they received our fax with the information they were needing to complete her order.  I told her that they said everything was good and that now they were just waiting on the approval from medicare to get these items sent out to her.  Told her hopefully this would be the last of her waiting and that of course if she had anymore issues that she could give [place] a call. Lamonte Sakai, Shanen Norris D, [place]

## 2016-05-07 NOTE — Telephone Encounter (Signed)
Pt would like April to call her back. ep

## 2016-05-14 ENCOUNTER — Other Ambulatory Visit: Payer: Self-pay

## 2016-05-14 MED ORDER — ESOMEPRAZOLE MAGNESIUM 40 MG PO CPDR: 40.0000 mg | DELAYED_RELEASE_CAPSULE | Freq: Every day | ORAL | 3 refills | Status: DC

## 2016-05-19 ENCOUNTER — Other Ambulatory Visit: Payer: Self-pay | Admitting: Internal Medicine

## 2016-05-19 ENCOUNTER — Telehealth: Payer: Self-pay | Admitting: *Deleted

## 2016-05-19 NOTE — Telephone Encounter (Signed)
Prior Authorization received from [employer] for [employer].PA completed online at www.covermymeds.com.  PA was approved via [employer] until 05/19/17.  Clovis Pu, RN

## 2016-05-21 ENCOUNTER — Encounter: Payer: Self-pay | Admitting: Cardiovascular Disease

## 2016-05-21 ENCOUNTER — Ambulatory Visit (INDEPENDENT_AMBULATORY_CARE_PROVIDER_SITE_OTHER): Payer: Medicare Other | Admitting: Pharmacist

## 2016-05-21 ENCOUNTER — Ambulatory Visit (INDEPENDENT_AMBULATORY_CARE_PROVIDER_SITE_OTHER): Payer: Medicare Other | Admitting: Cardiovascular Disease

## 2016-05-21 VITALS — BP 118/73 | HR 82 | Ht 67.0 in | Wt 209.2 lb

## 2016-05-21 DIAGNOSIS — H2513 Age-related nuclear cataract, bilateral: Secondary | ICD-10-CM | POA: Diagnosis not present

## 2016-05-21 DIAGNOSIS — E119 Type 2 diabetes mellitus without complications: Secondary | ICD-10-CM | POA: Diagnosis not present

## 2016-05-21 DIAGNOSIS — Z7901 Long term (current) use of anticoagulants: Secondary | ICD-10-CM | POA: Diagnosis not present

## 2016-05-21 DIAGNOSIS — Z9989 Dependence on other enabling machines and devices: Secondary | ICD-10-CM

## 2016-05-21 DIAGNOSIS — Z95810 Presence of automatic (implantable) cardiac defibrillator: Secondary | ICD-10-CM | POA: Diagnosis not present

## 2016-05-21 DIAGNOSIS — Z5181 Encounter for therapeutic drug level monitoring: Secondary | ICD-10-CM

## 2016-05-21 DIAGNOSIS — J4540 Moderate persistent asthma, uncomplicated: Secondary | ICD-10-CM

## 2016-05-21 DIAGNOSIS — E1140 Type 2 diabetes mellitus with diabetic neuropathy, unspecified: Secondary | ICD-10-CM

## 2016-05-21 DIAGNOSIS — E7800 Pure hypercholesterolemia, unspecified: Secondary | ICD-10-CM

## 2016-05-21 DIAGNOSIS — G4733 Obstructive sleep apnea (adult) (pediatric): Secondary | ICD-10-CM

## 2016-05-21 DIAGNOSIS — I4891 Unspecified atrial fibrillation: Secondary | ICD-10-CM

## 2016-05-21 DIAGNOSIS — IMO0002 Reserved for concepts with insufficient information to code with codable children: Secondary | ICD-10-CM

## 2016-05-21 DIAGNOSIS — I472 Ventricular tachycardia, unspecified: Secondary | ICD-10-CM

## 2016-05-21 DIAGNOSIS — Z79899 Other long term (current) drug therapy: Secondary | ICD-10-CM | POA: Diagnosis not present

## 2016-05-21 DIAGNOSIS — I5022 Chronic systolic (congestive) heart failure: Secondary | ICD-10-CM

## 2016-05-21 DIAGNOSIS — N182 Chronic kidney disease, stage 2 (mild): Secondary | ICD-10-CM

## 2016-05-21 DIAGNOSIS — I480 Paroxysmal atrial fibrillation: Secondary | ICD-10-CM

## 2016-05-21 DIAGNOSIS — E1165 Type 2 diabetes mellitus with hyperglycemia: Secondary | ICD-10-CM

## 2016-05-21 DIAGNOSIS — I10 Essential (primary) hypertension: Secondary | ICD-10-CM

## 2016-05-21 DIAGNOSIS — E669 Obesity, unspecified: Secondary | ICD-10-CM

## 2016-05-21 DIAGNOSIS — H52223 Regular astigmatism, bilateral: Secondary | ICD-10-CM | POA: Diagnosis not present

## 2016-05-21 DIAGNOSIS — H5201 Hypermetropia, right eye: Secondary | ICD-10-CM | POA: Diagnosis not present

## 2016-05-21 LAB — POCT INR: INR: 4.2

## 2016-05-21 NOTE — Patient Instructions (Signed)
Dr Royann Shivers recommends that you continue on your current medications as directed. Please refer to the Current Medication list given to you today.  Your physician recommends that you return for lab work in 6 months, just before your appointment with Dr C.  Remote monitoring is used to monitor your Pacemaker of ICD from home. This monitoring reduces the number of office visits required to check your device to one time per year. It allows [place] to keep an eye on the functioning of your device to ensure it is working properly. You are scheduled for a device check from home on Thursday, May 10th, 2018. You may send your transmission at any time that day. If you have a wireless device, the transmission will be sent automatically. After your physician reviews your transmission, you will receive a postcard with your next transmission date.  Dr Royann Shivers recommends that you schedule a follow-up appointment in 6 months with a defibrillator check. You will receive a reminder letter in the mail two months in advance. If you don't receive a letter, please call our office to schedule the follow-up appointment.  If you need a refill on your cardiac medications before your next appointment, please call your pharmacy.

## 2016-05-21 NOTE — Progress Notes (Signed)
Patient ID: [name], female   DOB: 08/22/1950, 66 y.o.   MRN: [id number] Patient ID: [name], female   DOB: 08/12/1950, 66 y.o.   MRN: [id number]    Cardiology Office Note    Date:  05/21/2016   ID:  [name], DOB [birth year], MRN [id number]  PCP:  [name]  Cardiologist:   [name]   Chief Complaint  Patient presents with  . Follow-up    History of Present Illness:  [name] is a 66 y.o. female with a long-standing history of nonischemic cardiomyopathy, chronic systolic heart failure and paroxysmal atrial fibrillation.  Her breathing is good. She denies wheezing, dyspnea or bloating. She weighs 206 pounds on her home scale, 209 here. She does not appear hypervolemic and I suspect she has gained a little bit of true weight during the winter months. She has not had orthopnea or PND and denies chest pain, palpitations, syncope or defibrillator discharges.  She has not required any recent hospitalizations or adjustment in her diuretic dose. She is unaware of atrial fibrillation or ventricular tachycardia although these have been recorded by her device.  She had antitachycardia pacing for ventricular tachycardia in December 2016. Since then a handful of episodes of nonsustained VT have been recorded, none meeting criteria for treatment. She has had atrial fibrillation throughout the year, with the burden of about 3.9%. A few episodes have lasted for several hours, but the vast majority of episodes of atrial fibrillation last for 2-20 minutes. Rate control during the episodes of atrial fibrillation is generally [name] at 90-110 bpm.  Otherwise she has 95% atrial pacing with a good heart rate histogram and only 9.3% ventricular pacing. Battery voltage is 2.72 V (RRT equals 2.63 V), lead parameters are all excellent.  Optiivol showed evidence of fluid overload throughout the month of December, now resolved. Activity level is stable at approximately 4 hours per  day.  She reports good compliance with CPAP therapy for obstructive sleep apnea and has not had any issues with exertional dyspnea, angina, syncope, lower extremity edema, dizziness. She is wearing a splint on her right hand. She developed arthritis of the metacarpophalangeal joints and was told that she had both arthritis and gout.   She has a history of moderate nonischemic cardiomyopathy (no coronary disease by cardiac catheterization January 2012), recurrent paroxysmal atrial fibrillation status post 2 ablation procedures ([name]), with history of ventricular fibrillation arrest while on treatment with diltiazem and dofetilide and a prolonged QT interval, probably related to simultaneous quinolone therapy. She has a dual-chamber [name] Protecta defibrillator implanted in January 2012. In December 2016 her defibrillator recorded an episode of sustained monomorphic ventricular tachycardia. Antitachycardia pacing was unsuccessful at converting the arrhythmia, but she had a "dirty break" before defibrillator shock was delivered. The shock was aborted. Polymorphic VT lasting 2 seconds was recorded in the fall of 2017 during asthma exacerbation.  Past Medical History:  Diagnosis Date  . AICD (automatic cardioverter/defibrillator) present    [name]  . Anemia   . Anxiety   . Arthritis   . Asthma    has had multiple hospitalizations for this  . Atrial fibrillation (HCC)    ablation x 2 [name], 01/2006, 2011.  on warfarin  . Cardiac arrest [facility]) jan 2012   in hospital for pneumonia when this occured- occured at the hospital  . CHF (congestive heart failure) (HCC) 2012   Echo 08/08/10 by [name]. EF 35-45%. LV systolic  function moderately reduced. Moderate global hypokinesis of LV.  RV systolic function moderately reduced. Mild MR. Trace AR.  Marland Kitchen Complication of anesthesia    difficult time waking up after anesthesia  . Depression   . Diabetes mellitus    Type 2  . Dysrhythmia     Atrial fibrillation  . GASTROESOPHAGEAL REFLUX, NO ESOPHAGITIS 06/10/2006   Qualifier: Diagnosis of  By: [name]    . GERD (gastroesophageal reflux disease)   . History of hiatal hernia   . Hyperlipidemia   . Hypertension   . Limb pain 06/04/2008   LLE, Baker's cyst in popliteal fossa, no DVT  . Non-ischemic cardiomyopathy (HCC)    echo 08/08/10 - EF 35-45% LV and RV systolic function mod reduced  . Primary localized osteoarthritis of left knee   . Primary localized osteoarthritis of right knee   . Primary localized osteoarthritis of right knee   . Sleep apnea    wears CPAP nightly  . Vocal cord disease   . Wears glasses     Past Surgical History:  Procedure Laterality Date  . BREAST LUMPECTOMY Right   . CARDIAC DEFIBRILLATOR PLACEMENT  05/02/10   [name] Protecta XT-DR for CHF-VT, last download 04/12/12  . CHOLECYSTECTOMY    . COLONOSCOPY    . HAMMER TOE SURGERY Right   . KNEE ARTHROSCOPY Right   . PAF Ablation     By Dr [name]. Now sees Dr [name] at [facility]  . TOTAL KNEE ARTHROPLASTY Left 09/17/2014  . TOTAL KNEE ARTHROPLASTY Left 09/17/2014   Procedure: TOTAL KNEE ARTHROPLASTY;  Surgeon: Diane Marvel, [name];  Location: [facility] OR;  Service: Orthopedics;  Laterality: Left;  pt has ICD  . TUBAL LIGATION      Outpatient Medications Prior to Visit  Medication Sig Dispense Refill  . bisoprolol (ZEBETA) 5 MG tablet Take 2 tablets (10 mg total) by mouth daily. 60 tablet 11  . budesonide-formoterol (SYMBICORT) 160-4.5 MCG/ACT inhaler Inhale 2 puffs into the lungs 2 (two) times daily. 1 Inhaler 6  . buPROPion (WELLBUTRIN XL) 150 MG 24 hr tablet take 1 tablet by mouth once daily 90 tablet 3  . chlorpheniramine (CHLOR-TRIMETON) 4 MG tablet 8mg  at night 14 tablet 0  . dofetilide (TIKOSYN) 250 MCG capsule Take 250 mcg by mouth 2 (two) times daily. Reported on 09/26/2015    . esomeprazole (NEXIUM) 40 MG capsule Take 1 capsule (40 mg total) by mouth daily. 30 capsule 3  .  fluticasone (FLONASE) 50 MCG/ACT nasal spray instill 1 spray into each nostril twice a day 16 g 5  . ibuprofen (ADVIL,MOTRIN) 600 MG tablet Take 1 tablet (600 mg total) by mouth every 6 (six) hours as needed. 30 tablet 0  . insulin aspart (NOVOLOG FLEXPEN) 100 UNIT/ML FlexPen Inject 4-16 Units into the skin 2 (two) times daily. Inject twice daily based on sliding scale, BG 70-130 = 0 units, 131-180 = 4 units, 181-240 = 8 units, 241-300 = 10 units, 301-350 = 12 units, 351-400 = 16units    . Insulin Glargine (LANTUS) 100 UNIT/ML Solostar Pen Inject 57 Units into the skin at bedtime. 15 mL 12  . Insulin Pen Needle (B-D ULTRAFINE III SHORT PEN) 31G X 8 MM MISC Use as directed up to 4 times daily to check blood sugar 100 each 9  . levalbuterol (XOPENEX) 0.63 MG/3ML nebulizer solution One vial in nebulizer four times daily as needed dx 493.00 360 mL 4  . mometasone-formoterol (DULERA) 200-5 MCG/ACT AERO Inhale 2 puffs into  the lungs 2 (two) times daily. 1 Inhaler 6  . montelukast (SINGULAIR) 10 MG tablet Take 1 tablet (10 mg total) by mouth at bedtime. 30 tablet 6  . polyethylene glycol powder (GLYCOLAX/MIRALAX) powder take 17GM (DISSOLVED IN WATER) by mouth once daily THREE TIMES A WEEK if needed as directed 500 g 3  . potassium chloride SA (K-DUR,KLOR-CON) 20 MEQ tablet Take 1 tablet (20 mEq total) by mouth daily as directed. 30 tablet 11  . rosuvastatin (CRESTOR) 10 MG tablet Take 1 tablet (10 mg total) by mouth daily. KEEP OV. 90 tablet 0  . torsemide (DEMADEX) 20 MG tablet Take 1 tablet (20 mg total) by mouth 2 (two) times daily as directed. 60 tablet 11  . warfarin (COUMADIN) 5 MG tablet No coumadin on 9/22 Take 2.5 mg starting on 9/23 daily until seen in the coumadin clinic. 45 tablet 5  . warfarin (COUMADIN) 5 MG tablet take 1 and 1/2 tablets by mouth once daily 45 tablet 5  . XOPENEX HFA 45 MCG/ACT inhaler INHALE 1 TO 2 PUFFS BY MOUTH INTO THE LUNGS EVERY 4 HOURS IF NEEDED FOR WHEEZING 15 Inhaler 5   . oxyCODONE-acetaminophen (PERCOCET/ROXICET) 5-325 MG tablet Take 1 tablet by mouth every 6 (six) hours as needed for severe pain. (Patient not taking: Reported on 03/24/2016) 5 tablet 0   No facility-administered medications prior to visit.      Allergies:   Avelox [moxifloxacin hcl in nacl]; Simvastatin; Ace inhibitors; and Latex   Social History   Social History  . Marital status: Widowed    Spouse name: N/A  . Number of children: N/A  . Years of education: N/A   Social History Main Topics  . Smoking status: Never Smoker  . Smokeless tobacco: Never Used  . Alcohol use No  . Drug use: No  . Sexual activity: No   Other Topics Concern  . Not on file   Social History Narrative   Not employed   Exercise- walking 1 hour daily.    Diet- eating healthy diet.       [facility]:   She is originally from [facility]. Has always lived in [place]. 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 Currently has a small dog. No bird or mold exposure.      Family History:  The patient's family history includes Diabetes in her father; Hypertension in her father.   ROS:   Please see the history of present illness.    ROS All other systems reviewed and are negative.   PHYSICAL EXAM:   VS:  BP 118/73   Pulse 82   Ht 5\' 7"  (1.702 m)   Wt 94.9 kg (209 lb 3.2 oz)   LMP 07/26/2011   BMI 32.77 kg/m    GEN: Well nourished, well developed, in no acute distress  HEENT: normal  Neck: no JVD, carotid bruits, or masses Cardiac: RRR; no murmurs, rubs, or gallops,no edema , healthy subclavian defibrillator site Respiratory:  clear to auscultation bilaterally, normal work of breathing GI: soft, nontender, nondistended, + BS MS: no deformity or atrophy  Skin: warm and dry, no rash Neuro:  Alert and Oriented x 3, Strength and sensation are intact Psych: euthymic mood, full affect  Wt Readings from  Last 3 Encounters:  05/21/16 94.9 kg (209 lb 3.2 oz)  04/21/16 93.5 kg (206 lb 3.2 oz)  03/24/16 93.4 kg (206 lb)  Studies/Labs Reviewed:   ECHO 01/01/2016 - Left ventricle: The cavity size was normal. Wall thickness was normal. Systolic function was normal. The estimated ejection   fraction was in the range of 50% to 55%. Mild hypokinesis of the basalinferior myocardium. Features are consistent with a   pseudonormal left ventricular filling pattern, with concomitant abnormal relaxation and increased filling pressure (grade 2   diastolic dysfunction). - Aortic valve: Mildly calcified annulus. There was trivial regurgitation. - Pulmonary arteries: Systolic pressure was mildly to moderately increased. PA peak pressure: 39 mm Hg (S).  EKG:  EKG is ordered today.  It shows atrial paced, ventricular sensed rhythm but is otherwise normal QTc 456 ms.  Recent Labs: 12/31/2015: ALT 39; B Natriuretic Peptide 79.5 01/03/2016: Magnesium 2.4 03/24/2016: Hemoglobin 12.1; Platelets 213.0 04/21/2016: BUN 14; Creat 1.11; Potassium 4.0; Sodium 139   Lipid Panel    Component Value Date/Time   CHOL 106 04/21/2016 1438   TRIG 73 04/21/2016 1438   HDL 44 (L) 04/21/2016 1438   CHOLHDL 2.4 04/21/2016 1438   VLDL 15 04/21/2016 1438   LDLCALC 47 04/21/2016 1438   LDLDIRECT 94 12/07/2007 2019     ASSESSMENT:    1. Ventricular tachycardia (HCC)   2. Paroxysmal atrial fibrillation (HCC)   3. Chronic systolic heart failure (HCC)   4. ICD (St. Jude Protecta dual-chamber),secondary prevention (VF arrest) January 2012   5. Long term current use of anticoagulant therapy   6. Essential hypertension   7. Obesity (BMI 30-39.9)   8. HYPERCHOLESTEROLEMIA   9. OSA on CPAP   10. Visit for monitoring Tikosyn therapy   11. Moderate persistent asthma without complication   12. Type 2 diabetes, uncontrolled, with neuropathy (HCC)   13. CKD (chronic kidney disease) stage 2, GFR 60-89 ml/min   14. Medication  management    1. VT: Infrequent recurrence of NSVT, no treated events since December 2016. QT interval is appropriately prolonged for treatment with dofetilide, not excessive. On beta-1 selective bisoprolol due to reactive airway disease. 2. PAF: fairly well controlled on dofetilide. The overall prevalence of atrial fibrillation has increased a little, possibly due to the use of long-acting bronchodilators. The arrhythmia appears to be asymptomatic. Rate control is not optimal, but we are on the maximum usual prescribed dose of bisoprolol in a patient with heart failure. I would avoid use of diltiazem or verapamil and I worry about the risks of digoxin toxicity. Since the overall burden of atrial fibrillation is still low, will continue the current medications. 3. CHF: Her breathing is at baseline, NYHA class I-II, clinically euvolemic. Optivol showed evidence of transient hypervolemia around Christmas, since corrected. Reset dry weight to 2006 lb. Take 20 mg torsemide if weight is 206 pounds or less on home scale, otherwise take 20 mg twice daily. When she takes the twice daily torsemide she should take a potassium supplement. 4. ICD: Normal device function. Will recheck her device remotely every 3 months. 5. Warfarin anticoagulation: No bleeding complications and no history of embolic TIA/stroke. CHADVasc 4 (CHF, gender, HTN, DM). INR 3.2 today. 6. HTN: controlled 7. HLP: Lipid profile excellent with LDL of 47, HDL 44, normal triglycerides 8. Obesity: Weight loss again encouraged. I believe she has actually gained a little weight. Will "reset" her dry weight to 206 pounds 9. OSA: reports compliance with CPAP 10. Tikosyn: QT interval in the expected/desired range. Reviewed the importance of being aware of potential drug interactions whenever she receives a new prescription or the dose of  an old prescription change. This also applies to her warfarin. 11. Asthma: Now on long-acting bronchodilators.  Discussed the competing pharmacological effects of her asthma medications and her heart medications. Switchied to more selective beta blocker today. 12. DM on long-term insulin therapy. Most recent A1c was elevated at 8.2% , improved even more since her last appointment, but not quite at goal yet 13. CKD: Recheck labs at least every 6 months while on dofetilide. Creatinine 1.1, better than our previous estimated baseline of 1.3   For some reason her insurance charges more for 10 mg bisoprolol tabs (nonformulary) than it does for 25 mg.  PLAN:  In order of problems listed above:  Fascinating. Have not seen one there before. Medication Adjustments/Labs and Tests Ordered: Current medicines are reviewed at length with the patient today.  Concerns regarding medicines are outlined above.  Medication changes, Labs and Tests ordered today are listed in the Patient Instructions below. Patient Instructions  Dr Royann Shivers recommends that you continue on your current medications as directed. Please refer to the Current Medication list given to you today.  Your physician recommends that you return for lab work in 6 months, just before your appointment with Dr C.  Remote monitoring is used to monitor your Pacemaker of ICD from home. This monitoring reduces the number of office visits required to check your device to one time per year. It allows [place] to keep an eye on the functioning of your device to ensure it is working properly. You are scheduled for a device check from home on Thursday, May 10th, 2018. You may send your transmission at any time that day. If you have a wireless device, the transmission will be sent automatically. After your physician reviews your transmission, you will receive a postcard with your next transmission date.  Dr Royann Shivers recommends that you schedule a follow-up appointment in 6 months with a defibrillator check. You will receive a reminder letter in the mail two months in advance.  If you don't receive a letter, please call our office to schedule the follow-up appointment.  If you need a refill on your cardiac medications before your next appointment, please call your pharmacy.      Signed, [name]  05/21/2016 9:55 AM    [facility] [address] [place], [place], [place]  [address] Phone: [phone]; Fax: [phone]

## 2016-05-27 LAB — CUP PACEART INCLINIC DEVICE CHECK
Battery Voltage: 2.72 V
Brady Statistic AP VP Percent: 9.18 %
Brady Statistic AS VP Percent: 0.09 %
Brady Statistic RV Percent Paced: 9.09 %
HIGH POWER IMPEDANCE MEASURED VALUE: 361 Ohm
HIGH POWER IMPEDANCE MEASURED VALUE: 70 Ohm
Implantable Lead Implant Date: 20120120
Implantable Lead Implant Date: 20120120
Implantable Lead Location: 753860
Implantable Lead Model: 5076
Implantable Lead Model: 6935
Implantable Pulse Generator Implant Date: 20120120
Lead Channel Impedance Value: 418 Ohm
Lead Channel Pacing Threshold Amplitude: 0.375 V
Lead Channel Pacing Threshold Amplitude: 0.75 V
Lead Channel Pacing Threshold Pulse Width: 0.4 ms
Lead Channel Pacing Threshold Pulse Width: 0.4 ms
Lead Channel Sensing Intrinsic Amplitude: 0.75 mV
Lead Channel Sensing Intrinsic Amplitude: 11.5 mV
Lead Channel Sensing Intrinsic Amplitude: 11.5 mV
Lead Channel Setting Pacing Amplitude: 2 V
Lead Channel Setting Pacing Amplitude: 2.5 V
Lead Channel Setting Pacing Pulse Width: 0.4 ms
Lead Channel Setting Sensing Sensitivity: 0.3 mV
MDC IDC LEAD LOCATION: 753859
MDC IDC MSMT LEADCHNL RA IMPEDANCE VALUE: 418 Ohm
MDC IDC MSMT LEADCHNL RA SENSING INTR AMPL: 0.75 mV
MDC IDC SESS DTM: 20180208143501
MDC IDC STAT BRADY AP VS PERCENT: 85.89 %
MDC IDC STAT BRADY AS VS PERCENT: 4.84 %
MDC IDC STAT BRADY RA PERCENT PACED: 91.02 %

## 2016-06-01 DIAGNOSIS — H43812 Vitreous degeneration, left eye: Secondary | ICD-10-CM | POA: Diagnosis not present

## 2016-06-01 DIAGNOSIS — H43821 Vitreomacular adhesion, right eye: Secondary | ICD-10-CM | POA: Diagnosis not present

## 2016-06-01 DIAGNOSIS — H35343 Macular cyst, hole, or pseudohole, bilateral: Secondary | ICD-10-CM | POA: Diagnosis not present

## 2016-06-01 DIAGNOSIS — H35411 Lattice degeneration of retina, right eye: Secondary | ICD-10-CM | POA: Diagnosis not present

## 2016-06-02 ENCOUNTER — Ambulatory Visit
Admission: RE | Admit: 2016-06-02 | Discharge: 2016-06-02 | Disposition: A | Discharge status: Home / Self Care | Payer: Medicare Other | Source: Ambulatory Visit | Attending: Family Medicine | Admitting: Family Medicine

## 2016-06-02 DIAGNOSIS — Z1231 Encounter for screening mammogram for malignant neoplasm of breast: Secondary | ICD-10-CM

## 2016-06-05 ENCOUNTER — Ambulatory Visit (INDEPENDENT_AMBULATORY_CARE_PROVIDER_SITE_OTHER): Payer: Medicare Other | Admitting: Pharmacist

## 2016-06-05 DIAGNOSIS — Z7901 Long term (current) use of anticoagulants: Secondary | ICD-10-CM

## 2016-06-05 DIAGNOSIS — R29898 Other symptoms and signs involving the musculoskeletal system: Secondary | ICD-10-CM | POA: Diagnosis not present

## 2016-06-05 DIAGNOSIS — M25631 Stiffness of right wrist, not elsewhere classified: Secondary | ICD-10-CM | POA: Diagnosis not present

## 2016-06-05 DIAGNOSIS — M10031 Idiopathic gout, right wrist: Secondary | ICD-10-CM | POA: Diagnosis not present

## 2016-06-05 DIAGNOSIS — I4891 Unspecified atrial fibrillation: Secondary | ICD-10-CM | POA: Diagnosis not present

## 2016-06-05 DIAGNOSIS — M25531 Pain in right wrist: Secondary | ICD-10-CM | POA: Diagnosis not present

## 2016-06-05 LAB — POCT INR: INR: 2.9

## 2016-06-08 DIAGNOSIS — I509 Heart failure, unspecified: Secondary | ICD-10-CM | POA: Diagnosis not present

## 2016-06-08 DIAGNOSIS — I480 Paroxysmal atrial fibrillation: Secondary | ICD-10-CM | POA: Diagnosis not present

## 2016-06-08 DIAGNOSIS — I481 Persistent atrial fibrillation: Secondary | ICD-10-CM | POA: Diagnosis not present

## 2016-06-08 DIAGNOSIS — R002 Palpitations: Secondary | ICD-10-CM | POA: Diagnosis not present

## 2016-06-08 DIAGNOSIS — E119 Type 2 diabetes mellitus without complications: Secondary | ICD-10-CM | POA: Diagnosis not present

## 2016-06-08 DIAGNOSIS — I4901 Ventricular fibrillation: Secondary | ICD-10-CM | POA: Diagnosis not present

## 2016-06-08 DIAGNOSIS — Z794 Long term (current) use of insulin: Secondary | ICD-10-CM | POA: Diagnosis not present

## 2016-06-09 DIAGNOSIS — M25531 Pain in right wrist: Secondary | ICD-10-CM | POA: Diagnosis not present

## 2016-06-09 DIAGNOSIS — H2513 Age-related nuclear cataract, bilateral: Secondary | ICD-10-CM | POA: Diagnosis not present

## 2016-06-09 DIAGNOSIS — H02839 Dermatochalasis of unspecified eye, unspecified eyelid: Secondary | ICD-10-CM | POA: Diagnosis not present

## 2016-06-09 DIAGNOSIS — H18411 Arcus senilis, right eye: Secondary | ICD-10-CM | POA: Diagnosis not present

## 2016-06-09 DIAGNOSIS — H35342 Macular cyst, hole, or pseudohole, left eye: Secondary | ICD-10-CM | POA: Diagnosis not present

## 2016-06-09 DIAGNOSIS — R29898 Other symptoms and signs involving the musculoskeletal system: Secondary | ICD-10-CM | POA: Diagnosis not present

## 2016-06-09 DIAGNOSIS — H43821 Vitreomacular adhesion, right eye: Secondary | ICD-10-CM | POA: Diagnosis not present

## 2016-06-09 DIAGNOSIS — M25631 Stiffness of right wrist, not elsewhere classified: Secondary | ICD-10-CM | POA: Diagnosis not present

## 2016-06-09 DIAGNOSIS — M10031 Idiopathic gout, right wrist: Secondary | ICD-10-CM | POA: Diagnosis not present

## 2016-06-09 DIAGNOSIS — H2512 Age-related nuclear cataract, left eye: Secondary | ICD-10-CM | POA: Diagnosis not present

## 2016-06-09 DIAGNOSIS — I10 Essential (primary) hypertension: Secondary | ICD-10-CM | POA: Diagnosis not present

## 2016-06-10 DIAGNOSIS — M25531 Pain in right wrist: Secondary | ICD-10-CM | POA: Diagnosis not present

## 2016-06-10 DIAGNOSIS — M10031 Idiopathic gout, right wrist: Secondary | ICD-10-CM | POA: Diagnosis not present

## 2016-06-10 DIAGNOSIS — M25631 Stiffness of right wrist, not elsewhere classified: Secondary | ICD-10-CM | POA: Diagnosis not present

## 2016-06-10 DIAGNOSIS — R29898 Other symptoms and signs involving the musculoskeletal system: Secondary | ICD-10-CM | POA: Diagnosis not present

## 2016-06-11 ENCOUNTER — Encounter: Payer: Self-pay | Admitting: Cardiovascular Disease

## 2016-06-11 ENCOUNTER — Telehealth: Payer: Self-pay

## 2016-06-11 NOTE — Telephone Encounter (Signed)
epicd 

## 2016-06-11 NOTE — Telephone Encounter (Signed)
Request for surgical clearance:   1. What type surgery is being performed? Cataract extraction w/ intraocular lens implantation of left eye  2. When is this surgery scheduled? 06/29/16  3. Are there any medications that need to be held prior to surgery and how long? N/A; pt takes warfarin 5 mg as directed per coumadin clinic  4. Name of the physician performing surgery: Dr Mia Creek  5. What is the office phone and fax number?   Phone [phone]  Fax [phone]

## 2016-06-16 ENCOUNTER — Other Ambulatory Visit: Payer: Self-pay | Admitting: Cardiovascular Disease

## 2016-06-18 ENCOUNTER — Encounter: Payer: Self-pay | Admitting: Pulmonary Disease

## 2016-06-19 ENCOUNTER — Ambulatory Visit (INDEPENDENT_AMBULATORY_CARE_PROVIDER_SITE_OTHER): Payer: Medicare Other | Admitting: Pulmonary Disease

## 2016-06-19 ENCOUNTER — Other Ambulatory Visit: Payer: Self-pay | Admitting: Cardiovascular Disease

## 2016-06-19 ENCOUNTER — Encounter: Payer: Self-pay | Admitting: Pulmonary Disease

## 2016-06-19 ENCOUNTER — Ambulatory Visit: Payer: Medicare Other | Admitting: Pulmonary Disease

## 2016-06-19 VITALS — BP 118/82 | HR 87 | Ht 67.0 in | Wt 207.6 lb

## 2016-06-19 DIAGNOSIS — J4540 Moderate persistent asthma, uncomplicated: Secondary | ICD-10-CM

## 2016-06-19 DIAGNOSIS — K219 Gastro-esophageal reflux disease without esophagitis: Secondary | ICD-10-CM

## 2016-06-19 DIAGNOSIS — G4733 Obstructive sleep apnea (adult) (pediatric): Secondary | ICD-10-CM | POA: Diagnosis not present

## 2016-06-19 DIAGNOSIS — Z9989 Dependence on other enabling machines and devices: Secondary | ICD-10-CM

## 2016-06-19 LAB — PULMONARY FUNCTION TEST
DL/VA % pred: 71 %
DL/VA: 3.66 ml/min/mmHg/L
DLCO UNC % PRED: 58 %
DLCO UNC: 16.56 ml/min/mmHg
DLCO cor % pred: 58 %
DLCO cor: 16.46 ml/min/mmHg
FEF 25-75 Post: 0.97 L/sec
FEF 25-75 Pre: 1.53 L/sec
FEF2575-%CHANGE-POST: -36 %
FEF2575-%PRED-POST: 46 %
FEF2575-%Pred-Pre: 72 %
FEV1-%CHANGE-POST: -8 %
FEV1-%PRED-POST: 84 %
FEV1-%PRED-PRE: 92 %
FEV1-POST: 1.88 L
FEV1-PRE: 2.06 L
FEV1FVC-%CHANGE-POST: -2 %
FEV1FVC-%Pred-Pre: 96 %
FEV6-%Change-Post: -6 %
FEV6-%PRED-POST: 92 %
FEV6-%PRED-PRE: 98 %
FEV6-PRE: 2.72 L
FEV6-Post: 2.55 L
FEV6FVC-%CHANGE-POST: 0 %
FEV6FVC-%PRED-PRE: 102 %
FEV6FVC-%Pred-Post: 101 %
FVC-%Change-Post: -6 %
FVC-%Pred-Post: 90 %
FVC-%Pred-Pre: 95 %
FVC-Post: 2.58 L
FVC-Pre: 2.74 L
POST FEV1/FVC RATIO: 73 %
PRE FEV6/FVC RATIO: 99 %
Post FEV6/FVC ratio: 99 %
Pre FEV1/FVC ratio: 75 %

## 2016-06-19 MED ORDER — RANITIDINE HCL 150 MG PO TABS: 150.0000 mg | ORAL_TABLET | Freq: Every day | ORAL | 6 refills | Status: DC

## 2016-06-19 MED ORDER — DOFETILIDE 250 MCG PO CAPS: 250.0000 ug | ORAL_CAPSULE | Freq: Two times a day (BID) | ORAL | 5 refills | Status: DC

## 2016-06-19 NOTE — Addendum Note (Signed)
Addended by: Pamalee Leyden on: 06/19/2016 12:30 PM   Modules accepted: Orders

## 2016-06-19 NOTE — Telephone Encounter (Signed)
New message    *STAT* If patient is at the pharmacy, call can be transferred to refill team.   1. Which medications need to be refilled? (please list name of each medication and dose if known)dofetilide (TIKOSYN) 250 MCG capsule  2. Which pharmacy/location (including street and city if local pharmacy) is medication to be sent to? [address] [facility]  3. Do they need a 30 day or 90 day supply? 30 day

## 2016-06-19 NOTE — Patient Instructions (Signed)
   Call me if you have any new breathing problems before your next appointment.  Use your Zantac daily for a week before you start to wean off of the Nexium.  After a week on Zantac start taking your Nexium every other day for a couple of weeks, then go to Monday, Wednesday, & Friday for a couple of weeks before stopping.  I will see you back in 6 months or sooner if needed.

## 2016-06-19 NOTE — Progress Notes (Signed)
PFT done today. Katie Welchel,CMA  

## 2016-06-19 NOTE — Telephone Encounter (Signed)
Rx(s) sent to pharmacy electronically.  

## 2016-06-19 NOTE — Progress Notes (Signed)
Subjective:    Patient ID: [name], female    DOB: [birth year], 66 y.o.   MRN: [id number]  C.C.:  Follow-up for Moderate, Persistent Asthma, GERD, & OSA.  HPI Moderate, persistent asthma: Previously on Pulmicort. Currently on treatment with [facility] & Singulair. She reports her breathing is doing "fine". She does have increased symptoms with inhaled exposures. No exacerbations since last appointment. No coughing or wheezing. No nocturnal awakenings. She reports she uses her rescue medication 3 times a month.   GERD: Previously prescribed Nexium. Recommended contemplation of switching to Pepcid or Zantac at last appointment. No reflux or dyspepsia. No morning brash water taste.   OSA: Currently on CPAP therapy. Current compliance download so excellent usage. Reports excellent quality of sleep with her machine.   Review of Systems No chest pain or pressure. No fever or chills. No rashes or bruising.   Allergies  Allergen Reactions  . Avelox [Moxifloxacin Hcl In Nacl] Other (See Comments)    Cardiac arrest per pt  . Simvastatin Other (See Comments)    myalgias  . Ace Inhibitors Cough    Dry cough   . Latex Rash    Current Outpatient Prescriptions on File Prior to Visit  Medication Sig Dispense Refill  . bisoprolol (ZEBETA) 5 MG tablet Take 2 tablets (10 mg total) by mouth daily. 60 tablet 11  . budesonide-formoterol (SYMBICORT) 160-4.5 MCG/ACT inhaler Inhale 2 puffs into the lungs 2 (two) times daily. 1 Inhaler 6  . buPROPion (WELLBUTRIN XL) 150 MG 24 hr tablet take 1 tablet by mouth once daily 90 tablet 3  . chlorpheniramine (CHLOR-TRIMETON) 4 MG tablet 8mg  at night 14 tablet 0  . dofetilide (TIKOSYN) 250 MCG capsule Take 250 mcg by mouth 2 (two) times daily. Reported on 09/26/2015    . esomeprazole (NEXIUM) 40 MG capsule Take 1 capsule (40 mg total) by mouth daily. 30 capsule 3  . fluticasone (FLONASE) 50 MCG/ACT nasal spray instill 1 spray into each nostril twice a day 16 g 5  .  insulin aspart (NOVOLOG FLEXPEN) 100 UNIT/ML FlexPen Inject 4-16 Units into the skin 2 (two) times daily. Inject twice daily based on sliding scale, BG 70-130 = 0 units, 131-180 = 4 units, 181-240 = 8 units, 241-300 = 10 units, 301-350 = 12 units, 351-400 = 16units    . Insulin Glargine (LANTUS) 100 UNIT/ML Solostar Pen Inject 57 Units into the skin at bedtime. 15 mL 12  . Insulin Pen Needle (B-D ULTRAFINE III SHORT PEN) 31G X 8 MM MISC Use as directed up to 4 times daily to check blood sugar 100 each 9  . levalbuterol (XOPENEX) 0.63 MG/3ML nebulizer solution One vial in nebulizer four times daily as needed dx 493.00 360 mL 4  . mometasone-formoterol (DULERA) 200-5 MCG/ACT AERO Inhale 2 puffs into the lungs 2 (two) times daily. 1 Inhaler 6  . montelukast (SINGULAIR) 10 MG tablet Take 1 tablet (10 mg total) by mouth at bedtime. 30 tablet 6  . polyethylene glycol powder (GLYCOLAX/MIRALAX) powder take 17GM (DISSOLVED IN WATER) by mouth once daily THREE TIMES A WEEK if needed as directed 500 g 3  . potassium chloride SA (K-DUR,KLOR-CON) 20 MEQ tablet Take 1 tablet (20 mEq total) by mouth daily as directed. 30 tablet 11  . rosuvastatin (CRESTOR) 10 MG tablet Take 1 tablet (10 mg total) by mouth daily. 90 tablet 3  . torsemide (DEMADEX) 20 MG tablet Take 1 tablet (20 mg total) by mouth 2 (two) times daily  as directed. 60 tablet 11  . warfarin (COUMADIN) 5 MG tablet take 1 and 1/2 tablets by mouth once daily 45 tablet 5  . XOPENEX HFA 45 MCG/ACT inhaler INHALE 1 TO 2 PUFFS BY MOUTH INTO THE LUNGS EVERY 4 HOURS IF NEEDED FOR WHEEZING 15 Inhaler 5   No current facility-administered medications on file prior to visit.     Past Medical History:  Diagnosis Date  . AICD (automatic cardioverter/defibrillator) present    Medtronic  . Anemia   . Anxiety   . Arthritis   . Asthma    has had multiple hospitalizations for this  . Atrial fibrillation (HCC)    ablation x 2 [facility], 01/2006, 2011.  on warfarin  .  Cardiac arrest [facility]) jan 2012   in hospital for pneumonia when this occured- occured at the hospital  . CHF (congestive heart failure) (HCC) 2012   Echo 08/08/10 by [facility]. EF 35-45%. LV systolic function moderately reduced. Moderate global hypokinesis of LV.  RV systolic function moderately reduced. Mild MR. Trace AR.  Marland Kitchen Complication of anesthesia    difficult time waking up after anesthesia  . Depression   . Diabetes mellitus    Type 2  . Dysrhythmia    Atrial fibrillation  . GASTROESOPHAGEAL REFLUX, NO ESOPHAGITIS 06/10/2006   Qualifier: Diagnosis of  By: Abundio Miu    . GERD (gastroesophageal reflux disease)   . History of hiatal hernia   . Hyperlipidemia   . Hypertension   . Limb pain 06/04/2008   LLE, Baker's cyst in popliteal fossa, no DVT  . Non-ischemic cardiomyopathy (HCC)    echo 08/08/10 - EF 35-45% LV and RV systolic function mod reduced  . Primary localized osteoarthritis of left knee   . Primary localized osteoarthritis of right knee   . Primary localized osteoarthritis of right knee   . Sleep apnea    wears CPAP nightly  . Vocal cord disease   . Wears glasses     Past Surgical History:  Procedure Laterality Date  . BREAST EXCISIONAL BIOPSY Right 1999  . BREAST LUMPECTOMY Right   . CARDIAC DEFIBRILLATOR PLACEMENT  05/02/10   Medtronic Protecta XT-DR for CHF-VT, last download 04/12/12  . CHOLECYSTECTOMY    . COLONOSCOPY    . HAMMER TOE SURGERY Right   . KNEE ARTHROSCOPY Right   . PAF Ablation     By Dr Sampson Goon. Now sees Dr Steele Berg at [facility]  . TOTAL KNEE ARTHROPLASTY Left 09/17/2014  . TOTAL KNEE ARTHROPLASTY Left 09/17/2014   Procedure: TOTAL KNEE ARTHROPLASTY;  Surgeon: Salvatore Marvel, MD;  Location: [facility] OR;  Service: Orthopedics;  Laterality: Left;  pt has ICD  . TUBAL LIGATION      Family History  Problem Relation Age of Onset  . Diabetes Father   . Hypertension Father   . Lung disease Neg Hx   . Cancer Neg Hx   . Rheumatologic  disease Neg Hx     Social History   Social History  . Marital status: Widowed    Spouse name: N/A  . Number of children: N/A  . Years of education: N/A   Social History Main Topics  . Smoking status: Never Smoker  . Smokeless tobacco: Never Used  . Alcohol use No  . Drug use: No  . Sexual activity: No   Other Topics Concern  . None   Social History Narrative   Not employed   Exercise- walking 1 hour daily.  Diet- eating healthy diet.       [facility]:   She is originally from [place] Has always lived in [place]. 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 Currently has a small dog. No bird or mold exposure.       Objective:   Physical Exam  BP 118/82 (BP Location: Right Arm, Patient Position: Sitting, Cuff Size: Large)   Pulse 87   Ht 5\' 7"  (1.702 m)   Wt 207 lb 9.6 oz (94.2 kg)   LMP 07/26/2011   SpO2 98%   BMI 32.51 kg/m   Gen.: Comfortable. Accompanied by a friend today. Alert. Integument: No rash or bruising exposed skin. Warm and dry. HEENT: Moderate bilateral nasal turbinate swelling unchanged. No oral ulcers. Moist mucous membranes. Pulmonary: Forced exhalation wheeze. Otherwise good aeration. No accessory muscles used. Abdomen: Soft. Nondistended. Nontender. Cardiovascular: Regular rate and rhythm. Normal S1 & S2. No edema.  PFT 06/19/16: FVC 2.74 L (95%) FEV1 2.06 L (92%) FEV1/FVC 0.75 FEF 25-75 1.53 L (72%) negative bronchodilator response                                                                   DLCO corrected 58% 11/04/15: FVC 2.95 L (102%) FEV1 2.10 L (93%) FEV1/FVC 0.71 FEF 25-75 1.37 L (64%) negative bronchodilator response TLC 6.02 L (109%) RV 109% ERV 47% DLCO uncorrected 75% (hgb 12.7) 12/06/08: FVC 3.43 L (99%) FEV1 2.46 L (94%) FEV1/FVC 0.72 FEF 25-75 1.59 L (85%) negative bronchodilator response TLC 6.17 L (112%) RV 131% ERV 52% DLCO  uncorrected 74%  CPAP COMPLIANCE DOWNLOAD 02/07 - 06/18/16:  100% usage. Average usage 7 hours 51 minutes. CPAP 8 cm H2O pressure. Residual AHI 2.3 events/hour.  IMAGING PORT CXR 12/31/15 (previously reviewed by me):  No focal opacity, mass, or effusion appreciated.  CXR PA/LAT 04/22/15 (previously reviewed by me): Tenting of left hemidiaphragm. No focal opacity or effusion appreciated. Heart normal in size & mediastinum normal in contour.  CXR PA/LAT 09/19/14 (previously reviewed by me): Questionable right basilar density/opacity. No pleural effusion appreciated. Heart normal in size. Mediastinum normal in contour.  LABS 03/24/16 CBC: 6.2/12.1/36.12/2011 Eosinophils: 0.2 IgE: 68 RAST Panel:  [place] grass 0.11 / Common Ragweed 0.28 / [place] 0.18 / Elm 0.13 / Rough Pigweed 0.16  01/08/16 CBC: 12.7/13.3/40.2/220 Eosinophils: 0 (on Prednisone)    Assessment & Plan:  66 y.o. female with moderate, persistent asthma, GERD, & OSA.  Patient spirometry today still shows no significant bronchodilator response. She did develop some wheezing post spirometry which is likely due to some vocal cord dysfunction. Overall her FEV1 has remained stable with slight reduction in her FVC. It's unclear what significance the reduction in her carbon dioxide diffusion capacity is given the problems she had during pulmonary function testing. Her reflux is well controlled but I do not feel the long-term risk of proton pump inhibitor daily therapy warrants at least attempt to transition to an H2 blocker. She does have excellent compliance with her CPAP therapy and excellent quality of sleep. I instructed the patient contact my office if she had any new breathing problems or questions before her next appointment.  1.  Moderate, persistent asthma:  Continuing Singulair and Dulera. No changes. 2. GERD:  Switching from Nexium to Zantac 150mg  daily.  3. OSA:  Continuing CPAP indefinitely.  4. Health maintenance: Status post  influenza vaccine August 2017, Pneumovax November 2013, Prevnar 2015, &  Tdap November 2013. 5. Follow-up: Return to clinic in 6 months or sooner if needed.   Donna Christen Jamison Neighbor, M.D. [facility] Pager:  [phone] After 3pm or if no response, call [phone] 10:24 AM 06/19/16

## 2016-06-23 ENCOUNTER — Ambulatory Visit (INDEPENDENT_AMBULATORY_CARE_PROVIDER_SITE_OTHER): Payer: Medicare Other

## 2016-06-23 DIAGNOSIS — I5022 Chronic systolic (congestive) heart failure: Secondary | ICD-10-CM | POA: Diagnosis not present

## 2016-06-23 DIAGNOSIS — Z95810 Presence of automatic (implantable) cardiac defibrillator: Secondary | ICD-10-CM

## 2016-06-23 NOTE — Telephone Encounter (Signed)
Faxed to [place] at [facility]

## 2016-06-24 ENCOUNTER — Ambulatory Visit (INDEPENDENT_AMBULATORY_CARE_PROVIDER_SITE_OTHER): Payer: Medicare Other | Admitting: Podiatry

## 2016-06-24 ENCOUNTER — Encounter: Payer: Self-pay | Admitting: Podiatry

## 2016-06-24 VITALS — BP 122/74 | HR 82 | Resp 18

## 2016-06-24 DIAGNOSIS — M79609 Pain in unspecified limb: Secondary | ICD-10-CM

## 2016-06-24 DIAGNOSIS — L84 Corns and callosities: Secondary | ICD-10-CM

## 2016-06-24 DIAGNOSIS — I739 Peripheral vascular disease, unspecified: Secondary | ICD-10-CM | POA: Diagnosis not present

## 2016-06-24 DIAGNOSIS — M25531 Pain in right wrist: Secondary | ICD-10-CM | POA: Diagnosis not present

## 2016-06-24 DIAGNOSIS — Q828 Other specified congenital malformations of skin: Secondary | ICD-10-CM | POA: Diagnosis not present

## 2016-06-24 DIAGNOSIS — B351 Tinea unguium: Secondary | ICD-10-CM | POA: Diagnosis not present

## 2016-06-24 DIAGNOSIS — M10031 Idiopathic gout, right wrist: Secondary | ICD-10-CM | POA: Diagnosis not present

## 2016-06-24 DIAGNOSIS — R29898 Other symptoms and signs involving the musculoskeletal system: Secondary | ICD-10-CM | POA: Diagnosis not present

## 2016-06-24 NOTE — Patient Instructions (Signed)

## 2016-06-24 NOTE — Progress Notes (Signed)
Patient ID: [name], female   DOB: 07/29/1950, 66 y.o.   MRN: [id number]    Subjective: Patient presents for scheduled visit complaining of uncomfortable toenails walking wearing shoes and painful corns and calluses on the right and left feet that are uncomfortable with shoe wearing walking as well Patient is diabetic denies any history of ulceration, claudication or amputation Denies history of smoking  Objective: Orientated 3 DP pulses 2/4 bilaterally PT pulses trace palpable bilaterally Capillary reflex immediate bilaterally Sensation to 10 g monofilament wire intact 4/5 right 5/5 left Vibratory sensation reactive bilaterally Ankle reflex equal and reactive bilaterally Open skin lesions bilaterally Toenails are hypertrophic, elongated, deformed and tender to direct palpation 6-10 Keratoses distal fourth right toe, medial right hallux, plantar second MPJ right, plantar left hallux, distal third left toe, fifth MPJ HAV bilaterally Bunionette's bilaterally  Assessment: Diabetic with decreased pedal pulses Diabetic with a history of peripheral arterial disease and peripheral neuropathy Protective sensation intact bilaterally Symptomatic onychomycoses 6-10 Multiple keratoses  Plan: Debridement of toenails 6-10 mechanically and alert without a bleeding Debride multiple keratoses without any bleeding  Reappoint 3 months 

## 2016-06-25 NOTE — Progress Notes (Signed)
Patient returned call.  She said she is feeling well.  She has eye surgery scheduled for Monday.  She denied any fluid symptoms.  Transmission reviewed and no changes.  Next ICM remote transmission on 07/28/2016.

## 2016-06-25 NOTE — Progress Notes (Signed)
EPIC Encounter for ICM Monitoring  Patient Name: [name] is a 66 y.o. female Date: 06/25/2016 Primary Care Physican: Garry Heater, DO Primary Cardiologist:Croitoru Electrophysiologist: Croitoru Dry Weight:unknown  Attempted call to patient and unable to reach.  Left message to return call.  Transmission reviewed.    Thoracic impedance normal.  Prescribed dosage: Torsemide 20 mg 1 tablet twice daily.  Potassium 20 mEq 1 tablet daily  Recommendations: NONE - Unable to reach patient   Follow-up plan: ICM clinic phone appointment on 07/28/2016.  Copy of ICM check sent to device physician.   3 month ICM trend: 06/23/2016   1 Year ICM trend:      [name], RN 06/25/2016 1:50 PM

## 2016-06-25 NOTE — Progress Notes (Signed)
Thanks MCr 

## 2016-06-26 ENCOUNTER — Ambulatory Visit (INDEPENDENT_AMBULATORY_CARE_PROVIDER_SITE_OTHER): Payer: Medicare Other | Admitting: Pharmacist

## 2016-06-26 DIAGNOSIS — I4891 Unspecified atrial fibrillation: Secondary | ICD-10-CM

## 2016-06-26 DIAGNOSIS — Z7901 Long term (current) use of anticoagulants: Secondary | ICD-10-CM

## 2016-06-26 LAB — POCT INR: INR: 2.7

## 2016-06-29 DIAGNOSIS — H2512 Age-related nuclear cataract, left eye: Secondary | ICD-10-CM | POA: Diagnosis not present

## 2016-07-02 DIAGNOSIS — M10031 Idiopathic gout, right wrist: Secondary | ICD-10-CM | POA: Diagnosis not present

## 2016-07-02 DIAGNOSIS — M1812 Unilateral primary osteoarthritis of first carpometacarpal joint, left hand: Secondary | ICD-10-CM | POA: Diagnosis not present

## 2016-07-13 ENCOUNTER — Other Ambulatory Visit: Payer: Self-pay | Admitting: Pulmonary Disease

## 2016-07-13 DIAGNOSIS — M10031 Idiopathic gout, right wrist: Secondary | ICD-10-CM | POA: Diagnosis not present

## 2016-07-13 DIAGNOSIS — R29898 Other symptoms and signs involving the musculoskeletal system: Secondary | ICD-10-CM | POA: Diagnosis not present

## 2016-07-13 DIAGNOSIS — M25531 Pain in right wrist: Secondary | ICD-10-CM | POA: Diagnosis not present

## 2016-07-16 ENCOUNTER — Telehealth: Payer: Self-pay | Admitting: Cardiovascular Disease

## 2016-07-16 DIAGNOSIS — H35411 Lattice degeneration of retina, right eye: Secondary | ICD-10-CM | POA: Diagnosis not present

## 2016-07-16 DIAGNOSIS — H35372 Puckering of macula, left eye: Secondary | ICD-10-CM | POA: Diagnosis not present

## 2016-07-16 DIAGNOSIS — H35342 Macular cyst, hole, or pseudohole, left eye: Secondary | ICD-10-CM | POA: Diagnosis not present

## 2016-07-16 DIAGNOSIS — H43813 Vitreous degeneration, bilateral: Secondary | ICD-10-CM | POA: Diagnosis not present

## 2016-07-16 NOTE — Telephone Encounter (Signed)
Will fax this note to the number provided. 

## 2016-07-16 NOTE — Telephone Encounter (Signed)
CHADS2 score = 3  (CHF, HTN, DM) with no history of stroke or TIA  Okay to hold warfarin 3 days prior to eye surgery. Next INR check on 4/13 and will discuss plan with patient as well.

## 2016-07-16 NOTE — Telephone Encounter (Signed)
New Message  Request for surgical clearance:  What type of surgery is being performed? Eye surgery  1. When is this surgery scheduled? July 31, 2016  2. Are there any medications that need to be held prior to surgery and how long? Coumadin 3 days prior  3. Name of physician performing surgery? Dr. Lita Mains   4. What is your office phone and fax number? [phone] Fax [phone]

## 2016-07-16 NOTE — Telephone Encounter (Signed)
Will forward to [facility] for direction.

## 2016-07-24 ENCOUNTER — Ambulatory Visit (INDEPENDENT_AMBULATORY_CARE_PROVIDER_SITE_OTHER): Payer: Medicare Other | Admitting: Pharmacist Clinician (PhC)/ Clinical Pharmacy Specialist

## 2016-07-24 DIAGNOSIS — Z7901 Long term (current) use of anticoagulants: Secondary | ICD-10-CM

## 2016-07-24 DIAGNOSIS — I4891 Unspecified atrial fibrillation: Secondary | ICD-10-CM

## 2016-07-24 LAB — POCT INR: INR: 2.1

## 2016-07-28 ENCOUNTER — Telehealth: Payer: Self-pay

## 2016-07-28 ENCOUNTER — Ambulatory Visit (INDEPENDENT_AMBULATORY_CARE_PROVIDER_SITE_OTHER): Payer: Medicare Other

## 2016-07-28 DIAGNOSIS — Z95810 Presence of automatic (implantable) cardiac defibrillator: Secondary | ICD-10-CM

## 2016-07-28 DIAGNOSIS — I5022 Chronic systolic (congestive) heart failure: Secondary | ICD-10-CM | POA: Diagnosis not present

## 2016-07-28 NOTE — Progress Notes (Signed)
EPIC Encounter for ICM Monitoring  Patient Name: [name] is a 65 y.o. female Date: 07/28/2016 Primary Care Physican: Garry Heater, DO Primary Cardiologist:Croitoru Electrophysiologist: Croitoru Dry Weight:unknown       Attempted call to patient and unable to reach.  Left detailed message regarding transmission.  Transmission reviewed.    Thoracic impedance normal.  Prescribed dosage: Torsemide 20 mg 1 tablet twice daily.  Potassium 20 mEq 1 tablet daily  Recommendations: Left voice mail with ICM number and encouraged to call for fluid symptoms.  Follow-up plan: ICM clinic phone appointment on 08/31/2016.    Copy of ICM check sent to cardiologist/device physician.   3 month ICM trend: 07/28/2016   1 Year ICM trend:      [name], RN 07/28/2016 10:51 AM

## 2016-07-28 NOTE — Telephone Encounter (Signed)
Remote ICM transmission received.  Attempted patient call and left detailed message regarding transmission and next ICM scheduled for 08/28/2016.  Advised to return call for any fluid symptoms or questions.

## 2016-07-28 NOTE — Progress Notes (Signed)
Thanks

## 2016-07-31 ENCOUNTER — Telehealth: Payer: Self-pay | Admitting: Pulmonary Disease

## 2016-07-31 DIAGNOSIS — H35342 Macular cyst, hole, or pseudohole, left eye: Secondary | ICD-10-CM | POA: Diagnosis not present

## 2016-07-31 DIAGNOSIS — H33302 Unspecified retinal break, left eye: Secondary | ICD-10-CM | POA: Diagnosis not present

## 2016-07-31 NOTE — Telephone Encounter (Signed)
A prescriber response form was faxed to [facility] at [phone]. Nothing further is needed.

## 2016-08-17 ENCOUNTER — Ambulatory Visit: Payer: Medicare Other | Admitting: Family Medicine

## 2016-08-18 ENCOUNTER — Ambulatory Visit (INDEPENDENT_AMBULATORY_CARE_PROVIDER_SITE_OTHER): Payer: Medicare Other | Admitting: Family Medicine

## 2016-08-18 ENCOUNTER — Encounter: Payer: Self-pay | Admitting: Family Medicine

## 2016-08-18 VITALS — BP 126/82 | HR 75 | Temp 97.9°F | Ht 67.0 in | Wt 210.8 lb

## 2016-08-18 DIAGNOSIS — IMO0002 Reserved for concepts with insufficient information to code with codable children: Secondary | ICD-10-CM

## 2016-08-18 DIAGNOSIS — E1165 Type 2 diabetes mellitus with hyperglycemia: Secondary | ICD-10-CM | POA: Diagnosis not present

## 2016-08-18 DIAGNOSIS — E1140 Type 2 diabetes mellitus with diabetic neuropathy, unspecified: Secondary | ICD-10-CM

## 2016-08-18 LAB — POCT GLYCOSYLATED HEMOGLOBIN (HGB A1C): Hemoglobin A1C: 8.1

## 2016-08-18 NOTE — Patient Instructions (Signed)
A1C 8.1 today, down from 8.2 in January 2018. Continue Lantus 57units. Try to check your blood sugar with lunch and supper. Use the below sliding scale consistently with both: 70-130= 0units 131-180= 4units 181-240= 8units 241-300= 10units 301-350= 12units 351-400= 16units  Please return in 3 months. If no improvement, we will need to consider adding the sliding scale to breakfast as well.  Dr. Caroleen Hamman

## 2016-08-20 DIAGNOSIS — H35342 Macular cyst, hole, or pseudohole, left eye: Secondary | ICD-10-CM | POA: Diagnosis not present

## 2016-08-20 NOTE — Progress Notes (Signed)
Subjective:     Patient ID: [name], female   DOB: July 13, 1950, 66 y.o.   MRN: [id number]  HPI [name] is a 66yo female presenting today for diabetes follow up. Last A1C in 04/2016 elevated to 8.2. Currently taking Lantus 57units at night. Prescribed Novolog sliding scale, but reports minimal compliance with this, using approximately every other day and not following sliding scale. Reports morning fasting blood sugars of 88-126 and blood sugar after dinner of 122-198 over the last 2 weeks. Goal is A1C less than 8 for knee surgery. Has had two recent surgeries since last office visit, hand surgery and emergency eye surgery for detached retina. Nonsmoker.  Review of Systems Per HPI    Objective:   Physical Exam  Constitutional: She appears well-developed and well-nourished. No distress.  Cardiovascular: Normal rate and regular rhythm.   No murmur heard. Pedal pulses intact bilaterally  Pulmonary/Chest: Effort normal. No respiratory distress. She has no wheezes.  Neurological:  Slightly decreased sensation over right foot compared to left  Skin:  No callous formation  Psychiatric: She has a normal mood and affect. Her behavior is normal.      Assessment and Plan:     A1C 8.1 today. Continue Lantus 57units. Noncompliant with meal time insulin. Recommend Novolog sliding scale (patient refuses set amount with each meal) twice daily. Sliding Scale below: 70-130= 0units 131-180= 4units 181-240= 8units 241-300= 10units 301-350= 12units 351-400= 16units  To check blood sugars at least three times daily (once fasting and twice postprandial with lunch and supper). Follow up in three months. Goal of A1C less than 8 to proceed with knee surgery.

## 2016-08-21 ENCOUNTER — Ambulatory Visit (INDEPENDENT_AMBULATORY_CARE_PROVIDER_SITE_OTHER): Payer: Medicare Other | Admitting: Pharmacist Clinician (PhC)/ Clinical Pharmacy Specialist

## 2016-08-21 DIAGNOSIS — Z7901 Long term (current) use of anticoagulants: Secondary | ICD-10-CM

## 2016-08-21 DIAGNOSIS — I4891 Unspecified atrial fibrillation: Secondary | ICD-10-CM

## 2016-08-21 LAB — POCT INR: INR: 2.5

## 2016-08-31 ENCOUNTER — Ambulatory Visit (INDEPENDENT_AMBULATORY_CARE_PROVIDER_SITE_OTHER): Payer: Medicare Other | Admitting: *Deleted

## 2016-08-31 ENCOUNTER — Telehealth: Payer: Self-pay

## 2016-08-31 DIAGNOSIS — I4581 Long QT syndrome: Secondary | ICD-10-CM

## 2016-08-31 DIAGNOSIS — Z95810 Presence of automatic (implantable) cardiac defibrillator: Secondary | ICD-10-CM

## 2016-08-31 DIAGNOSIS — I5022 Chronic systolic (congestive) heart failure: Secondary | ICD-10-CM

## 2016-08-31 NOTE — Telephone Encounter (Signed)
Remote ICM transmission received.  Attempted patient call and left message to return call.   

## 2016-08-31 NOTE — Progress Notes (Addendum)
EPIC Encounter for ICM Monitoring  Patient Name: NAZ LARROW is a 66 y.o. female Date: 08/31/2016 Primary Care Physican: Araceli Bouche, DO Primary Cardiologist:Croitoru Electrophysiologist: Croitoru Dry Weight:unknown     Clinical Status (28-Jul-2016 to 31-Aug-2016) Treated VT/VF 0 episodes  AT/AF 319 episodes  Time in AT/AF 0.8 hr/day (3.2%)      Attempted call to patient and unable to reach.  Left message to return call.  Transmission reviewed.    Thoracic impedance normal.    Prescribed dosage: Torsemide 20 mg 1 tablet twice daily. Potassium 20 mEq 1 tablet daily  Recommendations: NONE - Unable to reach patient   Follow-up plan: ICM clinic phone appointment on 10/01/2016.    Copy of ICM check sent to primary cardiologist/device physician.   3 month ICM trend: 08/31/2016   AT/AF    1 Year ICM trend:      [name], RN 08/31/2016 9:03 AM

## 2016-08-31 NOTE — Progress Notes (Signed)
Remote ICD transmission.   

## 2016-09-01 ENCOUNTER — Telehealth: Payer: Self-pay | Admitting: Pulmonary Disease

## 2016-09-01 LAB — CUP PACEART REMOTE DEVICE CHECK
Battery Voltage: 2.65 V
Brady Statistic AP VP Percent: 5.94 %
Brady Statistic AS VP Percent: 0.1 %
Brady Statistic RA Percent Paced: 90.3 %
Date Time Interrogation Session: 20180521103529
HIGH POWER IMPEDANCE MEASURED VALUE: 342 Ohm
HIGH POWER IMPEDANCE MEASURED VALUE: 74 Ohm
Implantable Lead Implant Date: 20120120
Implantable Lead Implant Date: 20120120
Implantable Pulse Generator Implant Date: 20120120
Lead Channel Pacing Threshold Amplitude: 0.5 V
Lead Channel Pacing Threshold Pulse Width: 0.4 ms
Lead Channel Sensing Intrinsic Amplitude: 12.5 mV
Lead Channel Sensing Intrinsic Amplitude: 12.5 mV
Lead Channel Setting Pacing Amplitude: 2 V
Lead Channel Setting Pacing Amplitude: 2.5 V
Lead Channel Setting Sensing Sensitivity: 0.3 mV
MDC IDC LEAD LOCATION: 753859
MDC IDC LEAD LOCATION: 753860
MDC IDC MSMT LEADCHNL RA IMPEDANCE VALUE: 399 Ohm
MDC IDC MSMT LEADCHNL RA SENSING INTR AMPL: 1.625 mV
MDC IDC MSMT LEADCHNL RA SENSING INTR AMPL: 1.625 mV
MDC IDC MSMT LEADCHNL RV IMPEDANCE VALUE: 418 Ohm
MDC IDC MSMT LEADCHNL RV PACING THRESHOLD AMPLITUDE: 0.625 V
MDC IDC MSMT LEADCHNL RV PACING THRESHOLD PULSEWIDTH: 0.4 ms
MDC IDC SET LEADCHNL RV PACING PULSEWIDTH: 0.4 ms
MDC IDC STAT BRADY AP VS PERCENT: 87.76 %
MDC IDC STAT BRADY AS VS PERCENT: 6.19 %
MDC IDC STAT BRADY RV PERCENT PACED: 6.02 %

## 2016-09-01 NOTE — Telephone Encounter (Signed)
Called and spoke with pt and she stated that about 1 am this morning her cpap stopped working.  She has done the trouble shooting for this and it will not turn on now.  She stated that she sleeps with this every night.  She called [facility] and they advised her that they could not see her until Thursday.    I have called and lmom for [facility].

## 2016-09-01 NOTE — Progress Notes (Addendum)
Patient reported she is doing fine and denied any fluid symptoms at this time.  She said she occasionally has some swelling in legs and takes extra Furosemide and Potassium when needed.   Next ICM remote transmission scheduled for 10/01/2016.

## 2016-09-01 NOTE — Progress Notes (Signed)
Seems to be returning to baseline. MCr

## 2016-09-02 ENCOUNTER — Encounter: Payer: Self-pay | Admitting: Cardiology

## 2016-09-03 NOTE — Telephone Encounter (Signed)
Spoke with pt, who states she did not meet with respiratory therapist today, due to no further issues with her machine after getting a new power cord.  Nothing further needed.

## 2016-09-03 NOTE — Telephone Encounter (Signed)
LM x 1 for patient to verify information

## 2016-09-03 NOTE — Telephone Encounter (Signed)
Barbara Cower from [facility] called back - pt came by their [place] office on 09/01/16 and she was given a new power supply cord, per Barbara Cower they received report that pt wore cpap last night for 8 hours, so the machine seems like it is working. If she has any additional problems the patient is seeing the respiratory therapist today at 10am- Barbara Cower can be reached at [phone] if needed -pr

## 2016-09-03 NOTE — Telephone Encounter (Signed)
Called and lmom for [place] from [facility] to call back.

## 2016-09-03 NOTE — Telephone Encounter (Signed)
Patient returned call [phone].

## 2016-09-07 ENCOUNTER — Other Ambulatory Visit: Payer: Self-pay | Admitting: Cardiovascular Disease

## 2016-09-18 ENCOUNTER — Ambulatory Visit (INDEPENDENT_AMBULATORY_CARE_PROVIDER_SITE_OTHER): Payer: Medicare Other | Admitting: Pharmacist

## 2016-09-18 DIAGNOSIS — I4891 Unspecified atrial fibrillation: Secondary | ICD-10-CM | POA: Diagnosis not present

## 2016-09-18 DIAGNOSIS — Z7901 Long term (current) use of anticoagulants: Secondary | ICD-10-CM

## 2016-09-18 LAB — POCT INR: INR: 3.2

## 2016-09-22 ENCOUNTER — Ambulatory Visit (INDEPENDENT_AMBULATORY_CARE_PROVIDER_SITE_OTHER): Payer: Medicare Other | Admitting: Podiatry

## 2016-09-22 ENCOUNTER — Encounter: Payer: Self-pay | Admitting: Podiatry

## 2016-09-22 VITALS — BP 150/96 | HR 86

## 2016-09-22 DIAGNOSIS — I739 Peripheral vascular disease, unspecified: Secondary | ICD-10-CM

## 2016-09-22 DIAGNOSIS — L84 Corns and callosities: Secondary | ICD-10-CM

## 2016-09-22 DIAGNOSIS — IMO0002 Reserved for concepts with insufficient information to code with codable children: Secondary | ICD-10-CM

## 2016-09-22 DIAGNOSIS — E1140 Type 2 diabetes mellitus with diabetic neuropathy, unspecified: Secondary | ICD-10-CM | POA: Diagnosis not present

## 2016-09-22 DIAGNOSIS — B351 Tinea unguium: Secondary | ICD-10-CM

## 2016-09-22 DIAGNOSIS — E1165 Type 2 diabetes mellitus with hyperglycemia: Secondary | ICD-10-CM

## 2016-09-22 DIAGNOSIS — M79609 Pain in unspecified limb: Secondary | ICD-10-CM

## 2016-09-22 DIAGNOSIS — Z794 Long term (current) use of insulin: Secondary | ICD-10-CM | POA: Diagnosis not present

## 2016-09-22 NOTE — Patient Instructions (Signed)

## 2016-09-22 NOTE — Progress Notes (Signed)
Patient ID: [name], female   DOB: 06/26/1950, 66 y.o.   MRN: [id number]    Subjective: Patient presents for scheduled visit complaining of uncomfortable toenails walking wearing shoes and painful corns and calluses on the right and left feet that are uncomfortable with shoe wearing walking as well Patient is diabetic denies any history of ulceration, claudication or amputation Denies history of smoking  Objective: Orientated 3 DP pulses 2/4 bilaterally PT pulses trace palpable bilaterally Capillary reflex immediate bilaterally Sensation to 10 g monofilament wire intact 4/5 right 5/5 left Vibratory sensation reactive bilaterally Ankle reflex equal and reactive bilaterally Open skin lesions bilaterally Toenails are hypertrophic, elongated, deformed and tender to direct palpation 6-10 Keratoses distal fourth right toe, medial right hallux, plantar second MPJ right, plantar left hallux, distal third left toe, fifth MPJ HAV bilaterally Bunionette's bilaterally  Assessment: Diabetic with decreased pedal pulses Diabetic with a history of peripheral arterial disease and peripheral neuropathy Protective sensation intact bilaterally Symptomatic onychomycoses 6-10 Multiple keratoses  Plan: Debridement of toenails 6-10 mechanically and alert without a bleeding Debride multiple keratoses without any bleeding  Reappoint 3 months 

## 2016-10-01 ENCOUNTER — Ambulatory Visit (INDEPENDENT_AMBULATORY_CARE_PROVIDER_SITE_OTHER): Payer: Medicare Other

## 2016-10-01 DIAGNOSIS — I5022 Chronic systolic (congestive) heart failure: Secondary | ICD-10-CM | POA: Diagnosis not present

## 2016-10-01 DIAGNOSIS — Z95810 Presence of automatic (implantable) cardiac defibrillator: Secondary | ICD-10-CM | POA: Diagnosis not present

## 2016-10-01 NOTE — Progress Notes (Signed)
EPIC Encounter for ICM Monitoring  Patient Name: [name] is a 66 y.o. female Date: 10/01/2016 Primary Care Physican: Araceli Bouche, DO Primary Cardiologist:Croitoru Electrophysiologist: Croitoru Dry Weight:unknown   Clinical Status (31-Aug-2016 to 01-Oct-2016) Treated VT/VF 0 episodes  V. Pacing 12.9%  Lower Rate 60 bpm AT/AF 643 episodes  Atrial Pacing 87.1%  Upper Rate 130 bpm Time in AT/AF 1.5 hr/day (6.3%) Longest AT/AF 10 minutes Observations (2) (31-Aug-2016 to 01-Oct-2016)  AT/AF >= 6 hr for 1 days.  Avg. Ventricular Rate >= 100 bpm during AT/AF (>= 6 hr) for 1 days.     Heart Failure questions reviewed, pt asymptomatic.   Thoracic impedance normal.  Prescribed dosage: Torsemide 20 mg 1 tablet twice daily. Potassium 20 mEq 1 tablet daily  Recommendations: No changes.  Encouraged to call for fluid symptoms.  Follow-up plan: ICM clinic phone appointment on 11/03/2016.  Office appointment scheduled on 11/23/2016 with Dr. Royann Shivers.  Copy of ICM check sent to cardiologist/device physician.   3 month ICM trend: 10/01/2016   1 Year ICM trend:      [name], RN 10/01/2016 3:20 PM

## 2016-10-06 ENCOUNTER — Other Ambulatory Visit: Payer: Self-pay | Admitting: Family Medicine

## 2016-10-16 ENCOUNTER — Ambulatory Visit (INDEPENDENT_AMBULATORY_CARE_PROVIDER_SITE_OTHER): Payer: Medicare Other | Admitting: Pharmacist

## 2016-10-16 DIAGNOSIS — I4891 Unspecified atrial fibrillation: Secondary | ICD-10-CM | POA: Diagnosis not present

## 2016-10-16 DIAGNOSIS — Z7901 Long term (current) use of anticoagulants: Secondary | ICD-10-CM | POA: Diagnosis not present

## 2016-10-16 LAB — POCT INR: INR: 2.5

## 2016-11-03 ENCOUNTER — Ambulatory Visit (INDEPENDENT_AMBULATORY_CARE_PROVIDER_SITE_OTHER): Payer: Medicare Other

## 2016-11-03 DIAGNOSIS — Z95810 Presence of automatic (implantable) cardiac defibrillator: Secondary | ICD-10-CM | POA: Diagnosis not present

## 2016-11-03 DIAGNOSIS — I5022 Chronic systolic (congestive) heart failure: Secondary | ICD-10-CM | POA: Diagnosis not present

## 2016-11-03 NOTE — Progress Notes (Signed)
EPIC Encounter for ICM Monitoring  Patient Name: ABBIGAIL REGIS is a 67 y.o. female Date: 11/03/2016 Primary Care Physican: Garth Bigness, MD Primary Cardiologist:Croitoru Electrophysiologist: Croitoru Dry Weight:unknown    Clinical Status (01-Oct-2016 to 03-Nov-2016) Treated VT/VF 0 episodes  V. Pacing 10.9%  AT/AF 519 episodes  Atrial Pacing 89.0%  Time in AT/AF 1.2 hr/day (4.9%)   Observations (0) (01-Oct-2016 to 03-Nov-2016)  No observations based on current interrogation.      Heart Failure questions reviewed, pt asymptomatic.   Thoracic impedance normal.  Prescribed dosage: Torsemide 20 mg 1 tablet twice daily. Potassium 20 mEq 1 tablet daily  Recommendations: No changes.  Encouraged to call for fluid symptoms.  Follow-up plan: ICM clinic phone appointment on 12/24/2016.  Office appointment scheduled 11/23/2016 with Dr. Royann Shivers.  Copy of ICM check sent to Dr Royann Shivers.   3 month ICM trend: 11/03/2016    AT/AF   1 Year ICM trend:     [name], RN 11/03/2016 12:43 PM

## 2016-11-19 DIAGNOSIS — H43821 Vitreomacular adhesion, right eye: Secondary | ICD-10-CM | POA: Diagnosis not present

## 2016-11-19 DIAGNOSIS — H35352 Cystoid macular degeneration, left eye: Secondary | ICD-10-CM | POA: Diagnosis not present

## 2016-11-19 DIAGNOSIS — H35411 Lattice degeneration of retina, right eye: Secondary | ICD-10-CM | POA: Diagnosis not present

## 2016-11-19 DIAGNOSIS — H43811 Vitreous degeneration, right eye: Secondary | ICD-10-CM | POA: Diagnosis not present

## 2016-11-23 ENCOUNTER — Encounter: Payer: Self-pay | Admitting: Cardiovascular Disease

## 2016-11-23 ENCOUNTER — Ambulatory Visit (INDEPENDENT_AMBULATORY_CARE_PROVIDER_SITE_OTHER): Payer: Medicare Other | Admitting: Cardiovascular Disease

## 2016-11-23 ENCOUNTER — Ambulatory Visit (INDEPENDENT_AMBULATORY_CARE_PROVIDER_SITE_OTHER): Payer: Medicare Other | Admitting: Pharmacist Clinician (PhC)/ Clinical Pharmacy Specialist

## 2016-11-23 VITALS — BP 130/74 | HR 85 | Ht 66.0 in | Wt 211.4 lb

## 2016-11-23 DIAGNOSIS — E7800 Pure hypercholesterolemia, unspecified: Secondary | ICD-10-CM | POA: Diagnosis not present

## 2016-11-23 DIAGNOSIS — I480 Paroxysmal atrial fibrillation: Secondary | ICD-10-CM

## 2016-11-23 DIAGNOSIS — Z79899 Other long term (current) drug therapy: Secondary | ICD-10-CM | POA: Diagnosis not present

## 2016-11-23 DIAGNOSIS — Z95810 Presence of automatic (implantable) cardiac defibrillator: Secondary | ICD-10-CM | POA: Diagnosis not present

## 2016-11-23 DIAGNOSIS — J4540 Moderate persistent asthma, uncomplicated: Secondary | ICD-10-CM | POA: Diagnosis not present

## 2016-11-23 DIAGNOSIS — IMO0002 Reserved for concepts with insufficient information to code with codable children: Secondary | ICD-10-CM

## 2016-11-23 DIAGNOSIS — G4733 Obstructive sleep apnea (adult) (pediatric): Secondary | ICD-10-CM

## 2016-11-23 DIAGNOSIS — Z5181 Encounter for therapeutic drug level monitoring: Secondary | ICD-10-CM

## 2016-11-23 DIAGNOSIS — I5022 Chronic systolic (congestive) heart failure: Secondary | ICD-10-CM

## 2016-11-23 DIAGNOSIS — E1140 Type 2 diabetes mellitus with diabetic neuropathy, unspecified: Secondary | ICD-10-CM

## 2016-11-23 DIAGNOSIS — Z7901 Long term (current) use of anticoagulants: Secondary | ICD-10-CM

## 2016-11-23 DIAGNOSIS — E669 Obesity, unspecified: Secondary | ICD-10-CM | POA: Diagnosis not present

## 2016-11-23 DIAGNOSIS — E1165 Type 2 diabetes mellitus with hyperglycemia: Secondary | ICD-10-CM

## 2016-11-23 DIAGNOSIS — N182 Chronic kidney disease, stage 2 (mild): Secondary | ICD-10-CM

## 2016-11-23 DIAGNOSIS — I472 Ventricular tachycardia, unspecified: Secondary | ICD-10-CM

## 2016-11-23 DIAGNOSIS — I4891 Unspecified atrial fibrillation: Secondary | ICD-10-CM | POA: Diagnosis not present

## 2016-11-23 DIAGNOSIS — Z9989 Dependence on other enabling machines and devices: Secondary | ICD-10-CM

## 2016-11-23 DIAGNOSIS — I10 Essential (primary) hypertension: Secondary | ICD-10-CM

## 2016-11-23 LAB — COMPREHENSIVE METABOLIC PANEL
ALBUMIN: 3.7 g/dL (ref 3.6–5.1)
ALT: 55 U/L — AB (ref 6–29)
AST: 33 U/L (ref 10–35)
Alkaline Phosphatase: 108 U/L (ref 33–130)
BILIRUBIN TOTAL: 0.5 mg/dL (ref 0.2–1.2)
BUN: 13 mg/dL (ref 7–25)
CO2: 25 mmol/L (ref 20–32)
CREATININE: 1.23 mg/dL — AB (ref 0.50–0.99)
Calcium: 8.5 mg/dL — ABNORMAL LOW (ref 8.6–10.4)
Chloride: 100 mmol/L (ref 98–110)
Glucose, Bld: 184 mg/dL — ABNORMAL HIGH (ref 65–99)
Potassium: 4.3 mmol/L (ref 3.5–5.3)
SODIUM: 136 mmol/L (ref 135–146)
TOTAL PROTEIN: 7.1 g/dL (ref 6.1–8.1)

## 2016-11-23 LAB — POCT INR: INR: 3.2

## 2016-11-23 NOTE — Patient Instructions (Signed)
Dr Royann Shivers recommends that you continue on your current medications as directed. Please refer to the Current Medication list given to you today.  Remote monitoring is used to monitor your Pacemaker of ICD from home. This monitoring reduces the number of office visits required to check your device to one time per year. It allows [place] to keep an eye on the functioning of your device to ensure it is working properly. You are scheduled for a device check from home on Monday, November 12th, 2018. You may send your transmission at any time that day. If you have a wireless device, the transmission will be sent automatically. After your physician reviews your transmission, you will receive a postcard with your next transmission date.  Dr Royann Shivers recommends that you schedule a follow-up appointment in 6 months with a defibrillator check. You will receive a reminder letter in the mail two months in advance. If you don't receive a letter, please call our office to schedule the follow-up appointment.  If you need a refill on your cardiac medications before your next appointment, please call your pharmacy.

## 2016-11-23 NOTE — Progress Notes (Signed)
Patient ID: [name], female   DOB: [birth year], 66 y.o.   MRN: [id number] Patient ID: [name], female   DOB: [birth year], 66 y.o.   MRN: [id number]    Cardiology Office Note    Date:  11/23/2016   ID:  [name], DOB September 27, 1950, MRN [id number]  PCP:  Sela Hilding, MD  Cardiologist:   Sanda Klein, MD   Chief Complaint  Patient presents with  . Follow-up    History of Present Illness:  [name] is a 66 y.o. female with a long-standing history of nonischemic cardiomyopathy, chronic systolic heart failure and paroxysmal atrial fibrillation.  Returns for routine follow-up and is accompanied by her sister. She has not had any new episodes of symptomatic arrhythmia since we last met, although her defibrillator shows frequent brief episodes of atrial arrhythmia. She has not had any bleeding problems or focal neurological events. She denies syncope, leg edema, orthopnea, PND or change in exertional dyspnea. She weighs just a couple of pounds more than she did at her last appointment 6 months ago. She denies defibrillator discharges. she has been compliant with her modalities.  Since her last appointment has been no evidence of VT, either sustained or nonsustained. She continues to have very frequent but extremely brief episodes of atrial arrhythmia, most of it atrial flutter. The overall burden of arrhythmia is 4.4%. All episodes are under 20 minutes in duration and most of them last for 2-4 minutes.  Her Medtronic Protecta defibrillator will likely reach RRT this year. Battery voltage is 2.64 V, approaching RRT of 2.63 V. She has 92% atrial pacing and 9.4% ventricular pacing, lead parameters remain excellent. Her thoracic impedance has been steady. Activity is constant at about 3.7 hours a day.  She had antitachycardia pacing for ventricular tachycardia in December 2016. Since then a handful of episodes of nonsustained VT have been recorded, none meeting criteria for treatment.  She has had atrial fibrillation throughout the year, with the burden of about 3.9%. A few episodes have lasted for several hours, but the vast majority of episodes of atrial fibrillation last for 2-20 minutes. Rate control during the episodes of atrial fibrillation is mediocre at 105-120 bpm, but as mentioned the episodes are generally very brief.  She is compliant with CPAP for obstructive sleep apnea.  She has a history of moderate nonischemic cardiomyopathy (no coronary disease by cardiac catheterization January 2012), recurrent paroxysmal atrial fibrillation status post 2 ablation procedures (Dr. Koleen Nimrod), with history of ventricular fibrillation arrest while on treatment with diltiazem and dofetilide and a prolonged QT interval, probably related to simultaneous quinolone therapy. She has a dual-chamber Medtronic Protecta defibrillator implanted in January 2012. In December 2016 her defibrillator recorded an episode of sustained monomorphic ventricular tachycardia. Antitachycardia pacing was unsuccessful at converting the arrhythmia, but she had a "dirty break" before defibrillator shock was delivered. The shock was aborted. Polymorphic VT lasting 2 seconds was recorded in the fall of 2017 during asthma exacerbation.  Past Medical History:  Diagnosis Date  . AICD (automatic cardioverter/defibrillator) present    Medtronic  . Anemia   . Anxiety   . Arthritis   . Asthma    has had multiple hospitalizations for this  . Atrial fibrillation ([place])    ablation x 2 [facility], 01/2006, 2011.  on warfarin  . Cardiac arrest [facility]) jan 2012   in hospital for pneumonia when this occured- occured at the hospital  . CHF (congestive heart failure) ([place]) 2012  Echo 08/08/10 by [facility]. EF 35-45%. LV systolic function moderately reduced. Moderate global hypokinesis of LV.  RV systolic function moderately reduced. Mild MR. Trace AR.  Marland Kitchen Complication of anesthesia    difficult time waking up after  anesthesia  . Depression   . Diabetes mellitus    Type 2  . Dysrhythmia    Atrial fibrillation  . GASTROESOPHAGEAL REFLUX, NO ESOPHAGITIS 06/10/2006   Qualifier: Diagnosis of  By: Eusebio Friendly    . GERD (gastroesophageal reflux disease)   . History of hiatal hernia   . Hyperlipidemia   . Hypertension   . Limb pain 06/04/2008   LLE, Baker's cyst in popliteal fossa, no DVT  . Non-ischemic cardiomyopathy ([place])    echo 08/08/10 - EF 15-40% LV and RV systolic function mod reduced  . Primary localized osteoarthritis of left knee   . Primary localized osteoarthritis of right knee   . Primary localized osteoarthritis of right knee   . Sleep apnea    wears CPAP nightly  . Vocal cord disease   . Wears glasses     Past Surgical History:  Procedure Laterality Date  . BREAST EXCISIONAL BIOPSY Right 1999  . BREAST LUMPECTOMY Right   . CARDIAC DEFIBRILLATOR PLACEMENT  05/02/10   Medtronic Protecta XT-DR for CHF-VT, last download 04/12/12  . CHOLECYSTECTOMY    . COLONOSCOPY    . HAMMER TOE SURGERY Right   . KNEE ARTHROSCOPY Right   . PAF Ablation     By Dr Ola Spurr. Now sees Dr Tilden Dome at [facility] Left 09/17/2014  . TOTAL KNEE ARTHROPLASTY Left 09/17/2014   Procedure: TOTAL KNEE ARTHROPLASTY;  Surgeon: Elsie Saas, MD;  Location: [facility];  Service: Orthopedics;  Laterality: Left;  pt has ICD  . TUBAL LIGATION      Outpatient Medications Prior to Visit  Medication Sig Dispense Refill  . B-D ULTRAFINE III SHORT PEN 31G X 8 MM MISC use as directed 100 each 9  . bisoprolol (ZEBETA) 5 MG tablet Take 2 tablets (10 mg total) by mouth daily. 60 tablet 11  . budesonide-formoterol (SYMBICORT) 160-4.5 MCG/ACT inhaler Inhale 2 puffs into the lungs 2 (two) times daily. 1 Inhaler 6  . buPROPion (WELLBUTRIN XL) 150 MG 24 hr tablet take 1 tablet by mouth once daily 90 tablet 3  . chlorpheniramine (CHLOR-TRIMETON) 4 MG tablet '8mg'$  at night 14 tablet 0  . dofetilide (TIKOSYN)  250 MCG capsule take 1 capsule by mouth every 12 hours 60 capsule 4  . dofetilide (TIKOSYN) 250 MCG capsule Take 1 capsule (250 mcg total) by mouth 2 (two) times daily. 60 capsule 5  . fluticasone (FLONASE) 50 MCG/ACT nasal spray instill 1 spray into each nostril twice a day 16 g 5  . insulin aspart (NOVOLOG FLEXPEN) 100 UNIT/ML FlexPen Inject 4-16 Units into the skin 2 (two) times daily. Inject twice daily based on sliding scale, BG 70-130 = 0 units, 131-180 = 4 units, 181-240 = 8 units, 241-300 = 10 units, 301-350 = 12 units, 351-400 = 16units    . Insulin Glargine (LANTUS) 100 UNIT/ML Solostar Pen Inject 57 Units into the skin at bedtime. 15 mL 12  . Insulin Pen Needle (B-D ULTRAFINE III SHORT PEN) 31G X 8 MM MISC Use as directed up to 4 times daily to check blood sugar 100 each 9  . levalbuterol (XOPENEX) 0.63 MG/3ML nebulizer solution One vial in nebulizer four times daily as needed dx 493.00 360 mL  4  . mometasone-formoterol (DULERA) 200-5 MCG/ACT AERO Inhale 2 puffs into the lungs 2 (two) times daily. 1 Inhaler 6  . montelukast (SINGULAIR) 10 MG tablet take 1 tablet by mouth at bedtime 30 tablet 6  . polyethylene glycol powder (GLYCOLAX/MIRALAX) powder take 17GM (DISSOLVED IN WATER) by mouth once daily THREE TIMES A WEEK if needed as directed 500 g 3  . potassium chloride SA (K-DUR,KLOR-CON) 20 MEQ tablet Take 1 tablet (20 mEq total) by mouth daily as directed. 30 tablet 11  . ranitidine (ZANTAC) 150 MG tablet Take 1 tablet (150 mg total) by mouth daily. 30 tablet 6  . rosuvastatin (CRESTOR) 10 MG tablet Take 1 tablet (10 mg total) by mouth daily. 90 tablet 3  . torsemide (DEMADEX) 20 MG tablet Take 1 tablet (20 mg total) by mouth 2 (two) times daily as directed. 60 tablet 11  . warfarin (COUMADIN) 5 MG tablet Take 1 to 1 and 1/2 daily as directed by coumadin clinic 45 tablet 5  . XOPENEX HFA 45 MCG/ACT inhaler INHALE 1 TO 2 PUFFS BY MOUTH INTO THE LUNGS EVERY 4 HOURS IF NEEDED FOR WHEEZING 15  Inhaler 5  . esomeprazole (NEXIUM) 40 MG capsule Take 1 capsule (40 mg total) by mouth daily. 30 capsule 3   No facility-administered medications prior to visit.      Allergies:   Avelox [moxifloxacin hcl in nacl]; Simvastatin; Ace inhibitors; and Latex   Social History   Social History  . Marital status: Widowed    Spouse name: N/A  . Number of children: N/A  . Years of education: N/A   Social History Main Topics  . Smoking status: Never Smoker  . Smokeless tobacco: Never Used  . Alcohol use No  . Drug use: No  . Sexual activity: No   Other Topics Concern  . None   Social History Narrative   Not employed   Exercise- walking 1 hour daily.    Diet- eating healthy diet.       [facility]:   She is originally from [facility]. Has always lived in [place]. 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 Currently has a small dog. No bird or mold exposure.      Family History:  The patient's family history includes Diabetes in her father; Hypertension in her father.   ROS:   Please see the history of present illness.    ROS All other systems reviewed and are negative.   PHYSICAL EXAM:   VS:  BP 130/74   Pulse 85   Ht 5\' 6"  (1.676 m)   Wt 211 lb 6.4 oz (95.9 kg)   LMP 07/26/2011   BMI 34.12 kg/m    GEN: Well nourished, well developed, in no acute distress  HEENT: normal  Neck: no JVD, carotid bruits, or masses Cardiac: RRR; no murmurs, rubs, or gallops,no edema , healthy subclavian defibrillator site Respiratory:  clear to auscultation bilaterally, normal work of breathing GI: soft, nontender, nondistended, + BS MS: no deformity or atrophy  Skin: warm and dry, no rash Neuro:  Alert and Oriented x 3, Strength and sensation are intact Psych: euthymic mood, full affect  Wt Readings from Last 3 Encounters:  11/23/16 211 lb 6.4 oz (95.9 kg)  08/18/16 210 lb 12.8 oz (95.6  kg)  06/19/16 207 lb 9.6 oz (94.2 kg)      Studies/Labs Reviewed:   ECHO  01/01/2016 - Left ventricle: The cavity size was normal. Wall thickness was normal. Systolic function was normal. The estimated ejection   fraction was in the range of 50% to 55%. Mild hypokinesis of the basalinferior myocardium. Features are consistent with a   pseudonormal left ventricular filling pattern, with concomitant abnormal relaxation and increased filling pressure (grade 2   diastolic dysfunction). - Aortic valve: Mildly calcified annulus. There was trivial regurgitation. - Pulmonary arteries: Systolic pressure was mildly to moderately increased. PA peak pressure: 39 mm Hg (S).  EKG:  EKG is ordered today.  It shows atrial paced, ventricular sensed rhythm, occasional PVCs, QTC 492 ms  Recent Labs: 12/31/2015: ALT 39; B Natriuretic Peptide 79.5 01/03/2016: Magnesium 2.4 03/24/2016: Hemoglobin 12.1; Platelets 213.0 04/21/2016: BUN 14; Creat 1.11; Potassium 4.0; Sodium 139   Lipid Panel    Component Value Date/Time   CHOL 106 04/21/2016 1438   TRIG 73 04/21/2016 1438   HDL 44 (L) 04/21/2016 1438   CHOLHDL 2.4 04/21/2016 1438   VLDL 15 04/21/2016 1438   LDLCALC 47 04/21/2016 1438   LDLDIRECT 94 12/07/2007 2019     ASSESSMENT:    1. Ventricular tachycardia (HCC)   2. Paroxysmal atrial fibrillation ([place])   3. Chronic systolic heart failure ([place])   4. ICD (implantable cardioverter-defibrillator) in place   5. Long term current use of anticoagulant therapy   6. Essential hypertension   7. HYPERCHOLESTEROLEMIA   8. Obesity (BMI 30-39.9)   9. OSA on CPAP   10. Visit for monitoring Tikosyn therapy   11. Moderate persistent asthma without complication   12. Type 2 diabetes, uncontrolled, with neuropathy ([place])   13. CKD (chronic kidney disease) stage 2, GFR 60-89 ml/min    1. VT: None has been seen since her last defibrillator check. She is at risk for developing torsades and we discussed the  importance of monitoring electrolyte and renal function and avoiding any medications echo prolonged QT interval. 2. PAF: Mostly atrial flutter, probably organized arrhythmia due to treatment with dofetilide. Although rate control is imperfect, she is on a maximum usual dose of bisoprolol. I think verapamil and diltiazem and digoxin are more likely to cause problems than help. Continue same medications and tolerate some RVR. 3. CHF: Seems to be euvolemic, NYHA functional class I. I think she may have gained a little more weight. Will use 210 pounds as her new "dry weight". To double the dose of diuretic and had potassium supplement if she is over 210 pounds on home scale.. 4. ICD: Normal device function, but estimated to reach RRT before the end of the year. We discussed the generator change procedure today. 5. Warfarin anticoagulation: Without bleeding complications or history of embolic stroke/TIA. CHADVasc 4 (CHF, gender, HTN, DM). INR 3.2 today. 6. HTN: controlled 7. HLP: Lipid parameters were all within the desirable range 8. Obesity: Weight loss again encouraged. Will "reset" her dry weight to 210 pounds 9. OSA: She reports 100% compliance with CPAP 10. Tikosyn: QT interval in the expected/desired range. Labs drawn today, results expected tomorrow. 11. Asthma: seemingly has been less of a problem after we switched to bisoprolol. 12. DM on long-term insulin therapy. Most recent A1c was still elevated at 8.1% , still not at goal 13. CKD: Recheck labs at least every 6 months while on dofetilide.    For some reason her insurance charges more for 10 mg bisoprolol tabs (nonformulary) than it does for 25 mg.  PLAN:  In order of problems listed above:  Fascinating. Have not seen one there before. Medication Adjustments/Labs and Tests Ordered: Current medicines are reviewed at length with the patient today.  Concerns regarding medicines are outlined above.  Medication changes, Labs and Tests  ordered today are listed in the Patient Instructions below. Patient Instructions  Dr Sallyanne Kuster recommends that you continue on your current medications as directed. Please refer to the Current Medication list given to you today.  Remote monitoring is used to monitor your Pacemaker of ICD from home. This monitoring reduces the number of office visits required to check your device to one time per year. It allows [place] to keep an eye on the functioning of your device to ensure it is working properly. You are scheduled for a device check from home on Monday, November 12th, 2018. You may send your transmission at any time that day. If you have a wireless device, the transmission will be sent automatically. After your physician reviews your transmission, you will receive a postcard with your next transmission date.  Dr Sallyanne Kuster recommends that you schedule a follow-up appointment in 6 months with a defibrillator check. You will receive a reminder letter in the mail two months in advance. If you don't receive a letter, please call our office to schedule the follow-up appointment.  If you need a refill on your cardiac medications before your next appointment, please call your pharmacy.      Signed, Sanda Klein, MD  11/23/2016 1:32 PM    [facility] [address], [place], [place]  [address] Phone: [phone]; Fax: [phone]

## 2016-11-24 ENCOUNTER — Encounter: Payer: Medicare Other | Admitting: Cardiovascular Disease

## 2016-12-04 ENCOUNTER — Ambulatory Visit (INDEPENDENT_AMBULATORY_CARE_PROVIDER_SITE_OTHER): Payer: Medicare Other | Admitting: Family Medicine

## 2016-12-04 ENCOUNTER — Encounter: Payer: Self-pay | Admitting: Family Medicine

## 2016-12-04 VITALS — BP 102/68 | HR 78 | Temp 98.3°F | Ht 66.0 in | Wt 214.2 lb

## 2016-12-04 DIAGNOSIS — E1165 Type 2 diabetes mellitus with hyperglycemia: Secondary | ICD-10-CM

## 2016-12-04 DIAGNOSIS — Z23 Encounter for immunization: Secondary | ICD-10-CM

## 2016-12-04 DIAGNOSIS — E1140 Type 2 diabetes mellitus with diabetic neuropathy, unspecified: Secondary | ICD-10-CM

## 2016-12-04 DIAGNOSIS — IMO0002 Reserved for concepts with insufficient information to code with codable children: Secondary | ICD-10-CM

## 2016-12-04 DIAGNOSIS — E2839 Other primary ovarian failure: Secondary | ICD-10-CM

## 2016-12-04 LAB — POCT GLYCOSYLATED HEMOGLOBIN (HGB A1C): Hemoglobin A1C: 9.1

## 2016-12-04 MED ORDER — INSULIN ASPART 100 UNIT/ML FLEXPEN: 5.0000 [IU] | PEN_INJECTOR | Freq: Two times a day (BID) | SUBCUTANEOUS | Status: DC

## 2016-12-04 NOTE — Assessment & Plan Note (Signed)
Poorly controlled, worsened A1c today. Patient reports she is compliant with all of her Lantus, minimally compliant with sliding scale. Through motivational interviewing, patient identified her eating habits to be part of the problem. We discussed that should she eat carbs, we need to cover those with sliding scale. Given options of set sliding scale with each meal vs checking CBGs and she elected for set sliding scale with each meal. 5u novolog for every meal with >2 servings of carbs. Diabetic diet teaching provided. Return in 3 months to recheck a1c.

## 2016-12-04 NOTE — Progress Notes (Signed)
   CC: diabetes  HPI Diabetes Mellitus Type II, Follow-up:  Lantus 57 every night - takes it every day. Didn't bring her glucometer. Previously prescribed sliding scale based on post prandial CBGs, but noncompliant. No sliding scale yesterday. 1 dose sliding scale the day before. Checks CBGs in the morning and at night. Morning 119-125; lowest was 80, and she felt a little weak then. One low of 40 a few weeks ago with dizziness which she reports was due to not eating - daughter was home and gave orange juice and crackers and resolved. First time this has ever happened. Evening sugars - 1-2 hours after dinner. 160-240.  Example diet from 24 hour recall of 8/22: Eats oatmeal then chicken biscuit for snack; then lunch at 2-3pm ([employer] chicken, biscuit, mac and cheese), dinner - none last night. Peanut butter crackers evening snack. Drinks only water. Walks at [employer].   ROS: denies CP, SOB, abd pain, rash, dysuria, changes in BMs.   CC, SH/smoking status, and VS noted  Objective: BP 102/68   Pulse 78   Temp 98.3 F (36.8 C) (Oral)   Ht _0  (1.676 m)   Wt 214 lb 3.2 oz (97.2 kg)   LMP 07/26/2011   SpO2 98%   BMI 34.57 kg/m  Gen: NAD, alert, cooperative, and pleasant. HEENT: NCAT, EOMI, PERRL CV: RRR, no murmur Resp: CTAB, no wheezes, non-labored Ext: No edema, warm Neuro: Alert and oriented, Speech clear, No gross deficits  Assessment and plan:  Type 2 diabetes, uncontrolled, with neuropathy (HCC) Poorly controlled, worsened A1c today. Patient reports she is compliant with all of her Lantus, minimally compliant with sliding scale. Through motivational interviewing, patient identified her eating habits to be part of the problem. We discussed that should she eat carbs, we need to cover those with sliding scale. Given options of set sliding scale with each meal vs checking CBGs and she elected for set sliding scale with each meal. 5u novolog for every meal with >2 servings of carbs.  Diabetic diet teaching provided. Return in 3 months to recheck a1c.   Orders Placed This Encounter  Procedures  . DG BONE DENSITY (DXA)    Standing Status:   Future    Standing Expiration Date:   02/03/2018    Order Specific Question:   Reason for Exam (SYMPTOM  OR DIAGNOSIS REQUIRED)    Answer:   >65 with multiple medical conditions    Order Specific Question:   Preferred imaging location?    Answer:   [facility]  . Flu Vaccine QUAD 36+ mos IM  . HgB A1c    Meds ordered this encounter  Medications  . insulin aspart (NOVOLOG FLEXPEN) 100 UNIT/ML FlexPen    Sig: Inject 5 Units into the skin 2 (two) times daily. Inject 5U with any meal containing 2 servings of carbs.    Dispense:  15 mL    Health Maintenance reviewed - flu shot given.  Ralene Ok, MD, PGY2 12/04/2016 10:32 AM

## 2016-12-04 NOTE — Patient Instructions (Signed)
It was a pleasure to see you today! Thank you for choosing [facility] for your primary care. [name] was seen for diabetes.   Our plans for today were:  Keep doing your Lantus at 57U nightly.   Take 5 units of novolog with each meal that contains 2 servings of carbs (ex. Of a serving of carbs is 1/2 a biscuit, 1/2c mac and cheese). There is some more information below about what counts as carbs.    Diet Recommendations for Diabetes   Starchy (carb) foods: Bread, rice, pasta, potatoes, corn, cereal, grits, crackers, bagels, muffins, all baked goods.  (Fruits, milk, and yogurt also have carbohydrate, but most of these foods will not spike your blood sugar as most starchy foods will.)  A few fruits do cause high blood sugars; use small portions of bananas (limit to 1/2 at a time), grapes, watermelon, oranges, and most tropical fruits.    Protein foods: Meat, fish, poultry, eggs, dairy foods, and beans such as pinto and kidney beans (beans also provide carbohydrate).   1. Eat at least 3 meals and 1-2 snacks per day. Never go more than 4-5 hours while awake without eating. Eat breakfast within the first hour of getting up.   2. Limit starchy foods to TWO per meal and ONE per snack. ONE portion of a starchy  food is equal to the following:   - ONE slice of bread (or its equivalent, such as half of a hamburger bun).   - 1/2 cup of a "scoopable" starchy food such as potatoes or rice.   - 15 grams of Total Carbohydrate as shown on food label.  3. Include at every meal: a protein food, a carb food, and vegetables and/or fruit.   - Obtain twice the volume of veg's as protein or carbohydrate foods for both lunch and dinner.   - Fresh or frozen veg's are best.   - Keep frozen veg's on hand for a quick vegetable serving.      You should return to our clinic to see Dr. Lindell Noe in 3 montsh for diabetes.   Best,  Dr. Lindell Noe

## 2016-12-15 ENCOUNTER — Ambulatory Visit (INDEPENDENT_AMBULATORY_CARE_PROVIDER_SITE_OTHER): Payer: Medicare Other | Admitting: Pharmacist Clinician (PhC)/ Clinical Pharmacy Specialist

## 2016-12-15 ENCOUNTER — Encounter: Payer: Self-pay | Admitting: Podiatry

## 2016-12-15 ENCOUNTER — Ambulatory Visit (INDEPENDENT_AMBULATORY_CARE_PROVIDER_SITE_OTHER): Payer: Medicare Other | Admitting: Podiatry

## 2016-12-15 VITALS — BP 132/93 | HR 90 | Temp 97.8°F

## 2016-12-15 DIAGNOSIS — I480 Paroxysmal atrial fibrillation: Secondary | ICD-10-CM | POA: Diagnosis not present

## 2016-12-15 DIAGNOSIS — I4891 Unspecified atrial fibrillation: Secondary | ICD-10-CM

## 2016-12-15 DIAGNOSIS — B351 Tinea unguium: Secondary | ICD-10-CM

## 2016-12-15 DIAGNOSIS — I739 Peripheral vascular disease, unspecified: Secondary | ICD-10-CM | POA: Diagnosis not present

## 2016-12-15 DIAGNOSIS — Z7901 Long term (current) use of anticoagulants: Secondary | ICD-10-CM

## 2016-12-15 DIAGNOSIS — L84 Corns and callosities: Secondary | ICD-10-CM

## 2016-12-15 DIAGNOSIS — M79609 Pain in unspecified limb: Secondary | ICD-10-CM

## 2016-12-15 LAB — POCT INR: INR: 3.7

## 2016-12-15 NOTE — Progress Notes (Signed)
Patient ID: [name], female   DOB: [birth year], 66 y.o.   MRN: [id number]    Subjective: Patient presents for scheduled visit complaining of uncomfortable toenails walking wearing shoes and painful corns and calluses on the right and left feet that are uncomfortable with shoe wearing walking as well Patient is diabetic denies any history of ulceration, claudication or amputation Denies history of smoking  Objective: Orientated 3 DP pulses 2/4 bilaterally PT pulses trace palpable bilaterally Capillary reflex immediate bilaterally Sensation to 10 g monofilament wire intact 4/5 right 5/5 left Vibratory sensation reactive bilaterally Ankle reflex equal and reactive bilaterally Open skin lesions bilaterally Toenails are hypertrophic, elongated, deformed and tender to direct palpation 6-10 Keratoses distal fourth right toe, medial right hallux, plantar second MPJ right, plantar left hallux, distal third left toe, fifth MPJ HAV bilaterally Bunionette's bilaterally  Assessment: Diabetic with decreased pedal pulses Diabetic with a history of peripheral arterial disease and peripheral neuropathy Protective sensation intact bilaterally Symptomatic onychomycoses 6-10 Multiple keratoses  Plan: Debridement of toenails 6-10 mechanically and alert without a bleeding Debride multiple keratoses without any bleeding  Reappoint 3 months

## 2016-12-15 NOTE — Patient Instructions (Signed)

## 2016-12-24 ENCOUNTER — Ambulatory Visit (INDEPENDENT_AMBULATORY_CARE_PROVIDER_SITE_OTHER): Payer: Medicare Other | Admitting: *Deleted

## 2016-12-24 DIAGNOSIS — Z95810 Presence of automatic (implantable) cardiac defibrillator: Secondary | ICD-10-CM | POA: Diagnosis not present

## 2016-12-24 DIAGNOSIS — I5022 Chronic systolic (congestive) heart failure: Secondary | ICD-10-CM | POA: Diagnosis not present

## 2016-12-24 DIAGNOSIS — I472 Ventricular tachycardia, unspecified: Secondary | ICD-10-CM

## 2016-12-24 NOTE — Progress Notes (Signed)
EPIC Encounter for ICM Monitoring  Patient Name: [name] is a 65 y.o. female Date: 12/24/2016 Primary Care Physican: Garth Bigness, MD Primary Cardiologist:Croitoru Electrophysiologist: Croitoru Dry Weight:unknown  Clinical Status (23-Nov-2016 to 24-Dec-2016) Treated VT/VF 0 episodes  V. Pacing 16.0%  Atrial Pacing 84.5%  Upper Rate 130 bpm Lower Rate 60 bpm  AT/AF 1126 episodes  Time in AT/AF 2.3 hr/day (9.8%)       Heart Failure questions reviewed, pt asymptomatic.   Thoracic impedance normal   Prescribed dosage: Torsemide 20 mg 1 tablet twice daily. Potassium 20 mEq 1 tablet daily  Recommendations: No changes.  Encouraged to call for fluid symptoms.  Follow-up plan: ICM clinic phone appointment on 01/26/2017.    Copy of ICM check sent to Dr. Royann Shivers.   3 month ICM trend: 12/24/2016   1 Year ICM trend:      [name], RN 12/24/2016 3:58 PM

## 2016-12-24 NOTE — Progress Notes (Signed)
Remote ICD transmission.   

## 2016-12-25 ENCOUNTER — Encounter: Payer: Self-pay | Admitting: Cardiology

## 2016-12-28 ENCOUNTER — Other Ambulatory Visit: Payer: Self-pay | Admitting: Pulmonary Disease

## 2016-12-28 ENCOUNTER — Other Ambulatory Visit: Payer: Self-pay | Admitting: Cardiovascular Disease

## 2016-12-28 NOTE — Telephone Encounter (Signed)
Rx(s) sent to pharmacy electronically.  

## 2016-12-30 ENCOUNTER — Ambulatory Visit (INDEPENDENT_AMBULATORY_CARE_PROVIDER_SITE_OTHER): Payer: Medicare Other | Admitting: Pulmonary Disease

## 2016-12-30 ENCOUNTER — Ambulatory Visit (INDEPENDENT_AMBULATORY_CARE_PROVIDER_SITE_OTHER): Payer: Medicare Other | Admitting: Pharmacist

## 2016-12-30 ENCOUNTER — Encounter: Payer: Self-pay | Admitting: Pulmonary Disease

## 2016-12-30 VITALS — BP 130/70 | HR 76 | Ht 66.0 in | Wt 210.2 lb

## 2016-12-30 DIAGNOSIS — K219 Gastro-esophageal reflux disease without esophagitis: Secondary | ICD-10-CM | POA: Diagnosis not present

## 2016-12-30 DIAGNOSIS — Z9989 Dependence on other enabling machines and devices: Secondary | ICD-10-CM | POA: Diagnosis not present

## 2016-12-30 DIAGNOSIS — Z7901 Long term (current) use of anticoagulants: Secondary | ICD-10-CM

## 2016-12-30 DIAGNOSIS — J4540 Moderate persistent asthma, uncomplicated: Secondary | ICD-10-CM | POA: Diagnosis not present

## 2016-12-30 DIAGNOSIS — I4891 Unspecified atrial fibrillation: Secondary | ICD-10-CM | POA: Diagnosis not present

## 2016-12-30 DIAGNOSIS — G4733 Obstructive sleep apnea (adult) (pediatric): Secondary | ICD-10-CM | POA: Diagnosis not present

## 2016-12-30 DIAGNOSIS — I480 Paroxysmal atrial fibrillation: Secondary | ICD-10-CM | POA: Diagnosis not present

## 2016-12-30 LAB — CUP PACEART REMOTE DEVICE CHECK
Battery Voltage: 2.63 V
Brady Statistic RA Percent Paced: 84.47 %
Brady Statistic RV Percent Paced: 16.01 %
Date Time Interrogation Session: 20180913062828
HighPow Impedance: 342 Ohm
HighPow Impedance: 78 Ohm
Implantable Lead Implant Date: 20120120
Implantable Lead Location: 753859
Implantable Lead Location: 753860
Implantable Lead Model: 5076
Implantable Lead Model: 6935
Lead Channel Impedance Value: 399 Ohm
Lead Channel Impedance Value: 418 Ohm
Lead Channel Pacing Threshold Amplitude: 0.375 V
Lead Channel Pacing Threshold Pulse Width: 0.4 ms
Lead Channel Sensing Intrinsic Amplitude: 0.25 mV
Lead Channel Sensing Intrinsic Amplitude: 0.25 mV
MDC IDC LEAD IMPLANT DT: 20120120
MDC IDC MSMT LEADCHNL RV PACING THRESHOLD AMPLITUDE: 0.625 V
MDC IDC MSMT LEADCHNL RV PACING THRESHOLD PULSEWIDTH: 0.4 ms
MDC IDC MSMT LEADCHNL RV SENSING INTR AMPL: 13.625 mV
MDC IDC MSMT LEADCHNL RV SENSING INTR AMPL: 13.625 mV
MDC IDC PG IMPLANT DT: 20120120
MDC IDC SET LEADCHNL RA PACING AMPLITUDE: 2 V
MDC IDC SET LEADCHNL RV PACING AMPLITUDE: 2.5 V
MDC IDC SET LEADCHNL RV PACING PULSEWIDTH: 0.4 ms
MDC IDC SET LEADCHNL RV SENSING SENSITIVITY: 0.3 mV
MDC IDC STAT BRADY AP VP PERCENT: 16.35 %
MDC IDC STAT BRADY AP VS PERCENT: 72.25 %
MDC IDC STAT BRADY AS VP PERCENT: 0.52 %
MDC IDC STAT BRADY AS VS PERCENT: 10.89 %

## 2016-12-30 LAB — POCT INR: INR: 2.4

## 2016-12-30 MED ORDER — MOMETASONE FURO-FORMOTEROL FUM 200-5 MCG/ACT IN AERO: 2.0000 | INHALATION_SPRAY | Freq: Two times a day (BID) | RESPIRATORY_TRACT | 6 refills | Status: DC

## 2016-12-30 MED ORDER — FLUTICASONE PROPIONATE 50 MCG/ACT NA SUSP: NASAL | 5 refills | Status: DC

## 2016-12-30 MED ORDER — PREDNISONE 20 MG PO TABS: 40.0000 mg | ORAL_TABLET | Freq: Every day | ORAL | 0 refills | Status: DC

## 2016-12-30 MED ORDER — LEVALBUTEROL TARTRATE 45 MCG/ACT IN AERO: INHALATION_SPRAY | RESPIRATORY_TRACT | 5 refills | Status: DC

## 2016-12-30 MED ORDER — RANITIDINE HCL 150 MG PO TABS: 150.0000 mg | ORAL_TABLET | Freq: Every day | ORAL | 6 refills | Status: DC

## 2016-12-30 NOTE — Patient Instructions (Addendum)
   Continue using your medications as prescribed.   We are sending in a prescription for Prednisone. Use this as an emergency prescription - if you feel your breathing is getting significantly worse and make sure you call our office to let us know.  Use the Flonase we are sending in to help your allergies. Make sure you angle the tip outward - away from your nasal septum - to keep from getting a nose bleed or shooting it down the back of your throat.  We will see you back in 6 months or sooner if needed.

## 2016-12-30 NOTE — Progress Notes (Signed)
Subjective:    Patient ID: [name], female    DOB: 16-Dec-1950, 66 y.o.   MRN: [id number]  C.C.:  Follow-up for Moderate, Persistent Asthma, GERD, & OSA.  HPI Moderate, persistent asthma: Previously patient was on Pulmicort. Transitioned to [facility] and Singulair previously. She reports she has used her rescue inhaler more the last couple of weeks for chest tightness. She reports minimal coughing & wheezing. No exacerbations since last appointment.   GERD: Switched from Nexium to Zantac 150 mg by mouth daily at bedtime at last appointment. Denies any new reflux or dyspepsia. No morning brash water taste.   OSA: Currently on CPAP therapy. Previous download showed excellent compliance. Adherent to CPAP therapy per report as well. Excellent quality of sleep.   Review of Systems No other chest pain or pressure. No fever or chills. No abdominal pain, nausea, or emesis. She does report some mild post-nasal drainage and sinus congestion.   Allergies  Allergen Reactions  . Avelox [Moxifloxacin Hcl In Nacl] Other (See Comments)    Cardiac arrest per pt  . Simvastatin Other (See Comments)    myalgias  . Ace Inhibitors Cough    Dry cough   . Latex Rash    Current Outpatient Prescriptions on File Prior to Visit  Medication Sig Dispense Refill  . B-D ULTRAFINE III SHORT PEN 31G X 8 MM MISC use as directed 100 each 9  . bisoprolol (ZEBETA) 5 MG tablet take 2 tablets by mouth once daily 60 tablet 10  . budesonide-formoterol (SYMBICORT) 160-4.5 MCG/ACT inhaler Inhale 2 puffs into the lungs 2 (two) times daily. 1 Inhaler 6  . buPROPion (WELLBUTRIN XL) 150 MG 24 hr tablet take 1 tablet by mouth once daily 90 tablet 3  . chlorpheniramine (CHLOR-TRIMETON) 4 MG tablet  at night 14 tablet 0  . dofetilide (TIKOSYN) 250 MCG capsule Take 1 capsule (250 mcg total) by mouth 2 (two) times daily. 60 capsule 5  . fluticasone (FLONASE) 50 MCG/ACT nasal spray instill 1 spray into each nostril twice a day 16  g 5  . insulin aspart (NOVOLOG FLEXPEN) 100 UNIT/ML FlexPen Inject 5 Units into the skin 2 (two) times daily. Inject 5U with any meal containing 2 servings of carbs. 15 mL   . Insulin Glargine (LANTUS) 100 UNIT/ML Solostar Pen Inject 57 Units into the skin at bedtime. 15 mL 12  . Insulin Pen Needle (B-D ULTRAFINE III SHORT PEN) 31G X 8 MM MISC Use as directed up to 4 times daily to check blood sugar 100 each 9  . levalbuterol (XOPENEX) 0.63 MG/3ML nebulizer solution One vial in nebulizer four times daily as needed dx 493.00 360 mL 4  . mometasone-formoterol (DULERA) 200-5 MCG/ACT AERO Inhale 2 puffs into the lungs 2 (two) times daily. 1 Inhaler 6  . montelukast (SINGULAIR) 10 MG tablet take 1 tablet by mouth at bedtime 30 tablet 6  . polyethylene glycol powder (GLYCOLAX/MIRALAX) powder take 17GM (DISSOLVED IN WATER) by mouth once daily THREE TIMES A WEEK if needed as directed 500 g 3  . potassium chloride SA (K-DUR,KLOR-CON) 20 MEQ tablet Take 1 tablet (20 mEq total) by mouth daily as directed. 30 tablet 11  . ranitidine (ZANTAC) 150 MG tablet take 1 tablet by mouth once daily 30 tablet 6  . rosuvastatin (CRESTOR) 10 MG tablet Take 1 tablet (10 mg total) by mouth daily. 90 tablet 3  . torsemide (DEMADEX) 20 MG tablet Take 1 tablet (20 mg total) by mouth 2 (two)  times daily as directed. 60 tablet 11  . warfarin (COUMADIN) 5 MG tablet Take 1 to 1 and 1/2 daily as directed by coumadin clinic 45 tablet 5  . XOPENEX HFA 45 MCG/ACT inhaler INHALE 1 TO 2 PUFFS BY MOUTH INTO THE LUNGS EVERY 4 HOURS IF NEEDED FOR WHEEZING 15 Inhaler 5   No current facility-administered medications on file prior to visit.     Past Medical History:  Diagnosis Date  . AICD (automatic cardioverter/defibrillator) present    Medtronic  . Anemia   . Anxiety   . Arthritis   . Asthma    has had multiple hospitalizations for this  . Atrial fibrillation (HCC)    ablation x 2 [facility], 01/2006, 2011.  on warfarin  . Cardiac  arrest [facility]) jan 2012   in hospital for pneumonia when this occured- occured at the hospital  . CHF (congestive heart failure) (HCC) 2012   Echo 08/08/10 by [facility]. EF 35-45%. LV systolic function moderately reduced. Moderate global hypokinesis of LV.  RV systolic function moderately reduced. Mild MR. Trace AR.  Marland Kitchen Complication of anesthesia    difficult time waking up after anesthesia  . Depression   . Diabetes mellitus    Type 2  . Dysrhythmia    Atrial fibrillation  . GASTROESOPHAGEAL REFLUX, NO ESOPHAGITIS 06/10/2006   Qualifier: Diagnosis of  By: Abundio Miu    . GERD (gastroesophageal reflux disease)   . History of hiatal hernia   . Hyperlipidemia   . Hypertension   . Limb pain 06/04/2008   LLE, Baker's cyst in popliteal fossa, no DVT  . Non-ischemic cardiomyopathy (HCC)    echo 08/08/10 - EF 35-45% LV and RV systolic function mod reduced  . Primary localized osteoarthritis of left knee   . Primary localized osteoarthritis of right knee   . Primary localized osteoarthritis of right knee   . Sleep apnea    wears CPAP nightly  . Vocal cord disease   . Wears glasses     Past Surgical History:  Procedure Laterality Date  . BREAST EXCISIONAL BIOPSY Right 1999  . BREAST LUMPECTOMY Right   . CARDIAC DEFIBRILLATOR PLACEMENT  05/02/10   Medtronic Protecta XT-DR for CHF-VT, last download 04/12/12  . CHOLECYSTECTOMY    . COLONOSCOPY    . HAMMER TOE SURGERY Right   . KNEE ARTHROSCOPY Right   . PAF Ablation     By Dr Sampson Goon. Now sees Dr Steele Berg at [facility]  . TOTAL KNEE ARTHROPLASTY Left 09/17/2014  . TOTAL KNEE ARTHROPLASTY Left 09/17/2014   Procedure: TOTAL KNEE ARTHROPLASTY;  Surgeon: Salvatore Marvel, MD;  Location: [facility] OR;  Service: Orthopedics;  Laterality: Left;  pt has ICD  . TUBAL LIGATION      Family History  Problem Relation Age of Onset  . Diabetes Father   . Hypertension Father   . Lung disease Neg Hx   . Cancer Neg Hx   . Rheumatologic disease Neg  Hx     Social History   Social History  . Marital status: Widowed    Spouse name: N/A  . Number of children: N/A  . Years of education: N/A   Social History Main Topics  . Smoking status: Never Smoker  . Smokeless tobacco: Never Used  . Alcohol use No  . Drug use: No  . Sexual activity: No   Other Topics Concern  . None   Social History Narrative   Not employed   Exercise- walking 1  hour daily.    Diet- eating healthy diet.       [facility]:   She is originally from [place]. Has always lived in [place]. 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 Currently has a small dog. No bird or mold exposure.       Objective:   Physical Exam  BP 130/70 (BP Location: Left Arm, Cuff Size: Normal)   Pulse 76   Ht  (1.676 m)   Wt 210 lb 4 oz (95.4 kg)   LMP 07/26/2011   SpO2 94%   BMI 33.94 kg/m   General:  Awake.Mild central obesity. No distress. Integument:  Warm & dry. No rash on exposed skin. No bruising on exposed skin. Extremities:  No cyanosis or clubbing.  HEENT:  Moderate bilateral nasal turbinate swelling with pale mucosa. No oral ulcers. Moist mucous membranes. Cardiovascular:  Regular rate. No edema. No appreciable JVD.  Pulmonary:  Clear to auscultation bilaterally. Normal work of breathing on room air. Abdomen: Soft. Normal bowel sounds. Protuberant. Musculoskeletal:  Normal bulk and tone. No joint deformity or effusion appreciated.  PFT 06/19/16: FVC 2.74 L (95%) FEV1 2.06 L (92%) FEV1/FVC 0.75 FEF 25-75 1.53 L (72%) negative bronchodilator response                                                                   DLCO corrected 58% 11/04/15: FVC 2.95 L (102%) FEV1 2.10 L (93%) FEV1/FVC 0.71 FEF 25-75 1.37 L (64%) negative bronchodilator response TLC 6.02 L (109%) RV 109% ERV 47% DLCO uncorrected 75% (hgb 12.7) 12/06/08: FVC 3.43 L (99%) FEV1 2.46 L (94%)  FEV1/FVC 0.72 FEF 25-75 1.59 L (85%) negative bronchodilator response TLC 6.17 L (112%) RV 131% ERV 52% DLCO uncorrected 74%  CPAP COMPLIANCE DOWNLOAD 02/07 - 06/18/16:  100% usage. Average usage 7 hours 51 minutes. CPAP 8 cm H2O pressure. Residual AHI 2.3 events/hour.  IMAGING PORT CXR 12/31/15 (previously reviewed by me):  No focal opacity, mass, or effusion appreciated.  CXR PA/LAT 04/22/15 (previously reviewed by me): Tenting of left hemidiaphragm. No focal opacity or effusion appreciated. Heart normal in size & mediastinum normal in contour.  CXR PA/LAT 09/19/14 (previously reviewed by me): Questionable right basilar density/opacity. No pleural effusion appreciated. Heart normal in size. Mediastinum normal in contour.  LABS 03/24/16 CBC: 6.2/12.1/36.12/2011 Eosinophils: 0.2 IgE: 68 RAST Panel:  [place] grass 0.11 / Common Ragweed 0.28 / [place] 0.18 / Elm 0.13 / Rough Pigweed 0.16  01/08/16 CBC: 12.7/13.3/40.2/220 Eosinophils: 0 (on Prednisone)    Assessment & Plan:  66 y.o. female with history of moderate, persistent asthma, GERD, and OSA. Patient has symptoms that would suggest seasonal allergic rhinitis. Given her postnasal drainage and intranasal corticosteroid may be of some symptomatic benefit. Her reflux is well controlled at this time utilizing Zantac only. I suspect her mild worsening in subjective symptoms recently is due to the weather changes. I have provided the patient with an emergency prednisone prescription and instructed her to initiate use if she develops any new or acutely worsening symptoms but did also instruct her to notify our office immediately.  1. Moderate persistent asthma: Patient given in emergency  prescription for prednisone 40 mg daily for 4 days. Continuing Singulair and Dulera. No changes. Refills of both Dulera and her Xopenex rescue inhaler have been supplied today. 2. GERD: Continuing Zantac. No changes. 3. Allergic rhinitis: Continuing Singulair.  Prescribing Flonase 1 spray in each nostril twice daily today. 4. OSA: Continuing CPAP indefinitely. 5. Health maintenance:  Status post Pneumovax November 2013, Prevnar 2015, &  Tdap November 2013. Status post high-dose influenza vaccine in August. 6. Follow-up: Return to clinic in 6 months or sooner if needed.  Donna Christen Jamison Neighbor, M.D. [facility] Pager:  [phone] After 3pm or if no response, call [phone] 11:01 AM 12/30/16

## 2016-12-31 ENCOUNTER — Telehealth: Payer: Self-pay | Admitting: Pulmonary Disease

## 2016-12-31 ENCOUNTER — Ambulatory Visit
Admission: RE | Admit: 2016-12-31 | Discharge: 2016-12-31 | Disposition: A | Discharge status: Home / Self Care | Payer: Medicare Other | Source: Ambulatory Visit | Attending: Family Medicine | Admitting: Family Medicine

## 2016-12-31 DIAGNOSIS — Z780 Asymptomatic menopausal state: Secondary | ICD-10-CM | POA: Diagnosis not present

## 2016-12-31 DIAGNOSIS — E2839 Other primary ovarian failure: Secondary | ICD-10-CM

## 2016-12-31 DIAGNOSIS — H35361 Drusen (degenerative) of macula, right eye: Secondary | ICD-10-CM | POA: Diagnosis not present

## 2016-12-31 DIAGNOSIS — H43821 Vitreomacular adhesion, right eye: Secondary | ICD-10-CM | POA: Diagnosis not present

## 2016-12-31 DIAGNOSIS — H35341 Macular cyst, hole, or pseudohole, right eye: Secondary | ICD-10-CM | POA: Diagnosis not present

## 2016-12-31 DIAGNOSIS — H35411 Lattice degeneration of retina, right eye: Secondary | ICD-10-CM | POA: Diagnosis not present

## 2016-12-31 DIAGNOSIS — M8589 Other specified disorders of bone density and structure, multiple sites: Secondary | ICD-10-CM | POA: Diagnosis not present

## 2016-12-31 MED ORDER — BUDESONIDE-FORMOTEROL FUMARATE 160-4.5 MCG/ACT IN AERO: 2.0000 | INHALATION_SPRAY | Freq: Two times a day (BID) | RESPIRATORY_TRACT | 6 refills | Status: DC

## 2016-12-31 NOTE — Telephone Encounter (Signed)
I called pt to let her know that we were changing the [facility] to Symbicort. I went over the directions and pt understood. I sent the Rx into [facility].

## 2016-12-31 NOTE — Telephone Encounter (Signed)
Switch her to Symbicort 160/4.5 - 2 puffs BID - #1 with 6 refills

## 2016-12-31 NOTE — Telephone Encounter (Signed)
Pt's insurance is not covering Dulera. Called [employer] and covered alternatives are Advair, Breo, Symbicort and Trelegy.  JN - please advise. Thanks.

## 2017-01-20 ENCOUNTER — Ambulatory Visit (INDEPENDENT_AMBULATORY_CARE_PROVIDER_SITE_OTHER): Payer: Medicare Other | Admitting: Pharmacist

## 2017-01-20 DIAGNOSIS — Z7901 Long term (current) use of anticoagulants: Secondary | ICD-10-CM

## 2017-01-20 DIAGNOSIS — I4891 Unspecified atrial fibrillation: Secondary | ICD-10-CM | POA: Diagnosis not present

## 2017-01-20 LAB — POCT INR: INR: 3.5

## 2017-01-23 ENCOUNTER — Other Ambulatory Visit: Payer: Self-pay | Admitting: Pulmonary Disease

## 2017-01-25 ENCOUNTER — Other Ambulatory Visit: Payer: Self-pay | Admitting: Cardiovascular Disease

## 2017-01-25 NOTE — Telephone Encounter (Signed)
Rx(s) sent to pharmacy electronically.  

## 2017-01-26 ENCOUNTER — Ambulatory Visit (INDEPENDENT_AMBULATORY_CARE_PROVIDER_SITE_OTHER): Payer: Medicare Other

## 2017-01-26 DIAGNOSIS — Z95810 Presence of automatic (implantable) cardiac defibrillator: Secondary | ICD-10-CM | POA: Diagnosis not present

## 2017-01-26 DIAGNOSIS — I5022 Chronic systolic (congestive) heart failure: Secondary | ICD-10-CM | POA: Diagnosis not present

## 2017-01-26 NOTE — Progress Notes (Signed)
EPIC Encounter for ICM Monitoring  Patient Name: [name] is a 65 y.o. female Date: 01/26/2017 Primary Care Physican: Garth Bigness, MD Primary Cardiologist:Croitoru Electrophysiologist: Croitoru Dry Weight: 208 lbs  Since 24-Dec-2016 AT/AF 451  Time in AT/AF 1.0 hr/day (4.1%)  Longest AT/AF 16 minutes  Event Summary 33 hours in AT/AF Since Last Session        Heart Failure questions reviewed, pt asymptomatic.   Thoracic impedance normal.  Prescribed dosage: Torsemide 20 mg 1 tablet twice daily. Potassium 20 mEq 1 tablet daily  Recommendations: No changes.   Encouraged to call for fluid symptoms.  Follow-up plan: ICM clinic phone appointment on 02/26/2017.    Copy of ICM check sent to Dr. Royann Shivers.   3 month ICM trend: 01/26/2017   AT/AF    1 Year ICM trend:      [name], RN 01/26/2017 11:42 AM

## 2017-01-26 NOTE — Progress Notes (Signed)
Thank you - everybody behaved today. MCr

## 2017-02-01 ENCOUNTER — Other Ambulatory Visit: Payer: Self-pay | Admitting: Family Medicine

## 2017-02-02 DIAGNOSIS — Z961 Presence of intraocular lens: Secondary | ICD-10-CM | POA: Diagnosis not present

## 2017-02-02 DIAGNOSIS — H2511 Age-related nuclear cataract, right eye: Secondary | ICD-10-CM | POA: Diagnosis not present

## 2017-02-10 ENCOUNTER — Ambulatory Visit (INDEPENDENT_AMBULATORY_CARE_PROVIDER_SITE_OTHER): Payer: Medicare Other | Admitting: Pharmacist

## 2017-02-10 DIAGNOSIS — Z7901 Long term (current) use of anticoagulants: Secondary | ICD-10-CM

## 2017-02-10 DIAGNOSIS — I480 Paroxysmal atrial fibrillation: Secondary | ICD-10-CM

## 2017-02-10 LAB — POCT INR: INR: 4

## 2017-02-19 ENCOUNTER — Other Ambulatory Visit: Payer: Self-pay | Admitting: Cardiovascular Disease

## 2017-02-23 ENCOUNTER — Ambulatory Visit (INDEPENDENT_AMBULATORY_CARE_PROVIDER_SITE_OTHER): Payer: Medicare Other | Admitting: Podiatry

## 2017-02-23 ENCOUNTER — Encounter: Payer: Self-pay | Admitting: Podiatry

## 2017-02-23 ENCOUNTER — Ambulatory Visit (INDEPENDENT_AMBULATORY_CARE_PROVIDER_SITE_OTHER): Payer: Medicare Other | Admitting: Pharmacist Clinician (PhC)/ Clinical Pharmacy Specialist

## 2017-02-23 DIAGNOSIS — I4891 Unspecified atrial fibrillation: Secondary | ICD-10-CM

## 2017-02-23 DIAGNOSIS — I480 Paroxysmal atrial fibrillation: Secondary | ICD-10-CM

## 2017-02-23 DIAGNOSIS — B351 Tinea unguium: Secondary | ICD-10-CM

## 2017-02-23 DIAGNOSIS — D689 Coagulation defect, unspecified: Secondary | ICD-10-CM

## 2017-02-23 DIAGNOSIS — Z794 Long term (current) use of insulin: Secondary | ICD-10-CM

## 2017-02-23 DIAGNOSIS — Z7901 Long term (current) use of anticoagulants: Secondary | ICD-10-CM | POA: Diagnosis not present

## 2017-02-23 DIAGNOSIS — L84 Corns and callosities: Secondary | ICD-10-CM

## 2017-02-23 DIAGNOSIS — M79609 Pain in unspecified limb: Secondary | ICD-10-CM

## 2017-02-23 DIAGNOSIS — I739 Peripheral vascular disease, unspecified: Secondary | ICD-10-CM | POA: Diagnosis not present

## 2017-02-23 DIAGNOSIS — E1140 Type 2 diabetes mellitus with diabetic neuropathy, unspecified: Secondary | ICD-10-CM | POA: Diagnosis not present

## 2017-02-23 LAB — POCT INR: INR: 2.1

## 2017-02-23 NOTE — Progress Notes (Signed)
Patient ID: [name], female   DOB: 1950-05-14, 66 y.o.   MRN: [id number]    Subjective: Patient presents for scheduled visit complaining of uncomfortable toenails walking wearing shoes and painful corns and calluses on the right and left feet that are uncomfortable with shoe wearing walking as well Patient is diabetic denies any history of ulceration, claudication or amputation Denies history of smoking  Objective: Orientated 3 DP pulses 2/4 bilaterally PT pulses trace palpable bilaterally Capillary reflex immediate bilaterally Sensation to 10 g monofilament wire intact 4/5 right 5/5 left Vibratory sensation reactive bilaterally Ankle reflex equal and reactive bilaterally Open skin lesions bilaterally Toenails are hypertrophic, elongated, deformed and tender to direct palpation 6-10 Keratoses distal fourth right toe, medial right hallux, plantar second MPJ right, plantar left hallux, distal third left toe, fifth MPJ HAV bilaterally Bunionette's bilaterally   Assessment: Diabetic with decreased pedal pulses Diabetic with a history of peripheral arterial disease and peripheral neuropathy Protective sensation intact bilaterally Symptomatic onychomycoses 6-10 Multiple keratoses Blood clotting disorder associated with warfarin therapy  Plan: Debridement of toenails 6-10 mechanically and alert without a bleeding Debride multiple keratoses without any bleeding  Reappoint 3 months

## 2017-02-23 NOTE — Patient Instructions (Signed)

## 2017-02-26 ENCOUNTER — Telehealth: Payer: Self-pay | Admitting: Cardiology

## 2017-02-26 NOTE — Telephone Encounter (Signed)
LMOVM reminding pt to send remote transmission.   

## 2017-02-28 NOTE — Progress Notes (Signed)
Subjective:    Patient ID: [name] , female   DOB: [birth year] , 66 y.o..   MRN: [id number]  HPI  [name] is a 66 yo F with PMH of hypertension, A. Fib on chronic anticoagulation, systolic heart failure, OSA, type 2 diabetes, CKD stage II, hypercholesterolemia, obesity here for  Chief Complaint  Patient presents with  . Diabetes   1. Chronic Diabetes Disease Monitoring  Blood Sugar Ranges: 92-96 in the mornings. She also checks it in the evenings usually 150's- 160's. Sometimes she skips dinner.   Polyuria: no   Visual problems: yes, wears glasses. Has been seeing opthalmology in left eye due to "hole in her eye". Sees Dr. Larry Sierras. She wears glasses.   Last hemoglobin A1C:  Lab Results  Component Value Date   HGBA1C 8.7 03/01/2017   Medication Compliance: yes, takes Lantus 57 units at night, and 5 units of novolog sometimes twice a day. Does not take metformin now because of GI upset.  Medication Side Effects  Hypoglycemia: no   Preventitive Health Care  Eye Exam: see her eye doctor   Foot Exam: overdue  Diet pattern: eating 2 meals a day, sometimes skips dinner  Exercise: Still walking a little bit, but can't walk far because of left knee.   Review of Systems: Per HPI.   Past Medical History: Patient Active Problem List   Diagnosis Date Noted  . Primary localized osteoarthritis of right knee   . Visit for monitoring Tikosyn therapy 08/21/2015  . Long term current use of anticoagulant therapy 08/21/2015  . Preoperative cardiovascular examination 08/21/2015  . Ventricular tachycardia (HCC) 05/22/2015  . Obesity (BMI 30-39.9) 01/04/2014  . ICD (St. Jude Protecta dual-chamber),secondary prevention (VF arrest) January 2012 11/19/2012  . CKD (chronic kidney disease) stage 2, GFR 60-89 ml/min 02/22/2012  . Asthma, moderate persistent 12/10/2011  . Health maintenance examination 11/02/2011  . Diabetic neuropathy (HCC) 09/18/2011  . Chronic systolic heart failure  (HCC) 28/76/8115  . OSTEOARTHRITIS, KNEE 08/28/2008  . DISEASE, VOCAL CORD NEC 10/13/2006  . Type 2 diabetes, uncontrolled, with neuropathy (HCC) 06/10/2006  . HYPERCHOLESTEROLEMIA 06/10/2006  . Essential hypertension 06/10/2006  . Atrial fibrillation (HCC) 06/10/2006  . OSA on CPAP 06/10/2006    Medications: reviewed and updated Current Outpatient Medications  Medication Sig Dispense Refill  . B-D ULTRAFINE III SHORT PEN 31G X 8 MM MISC use as directed 100 each 9  . bisoprolol (ZEBETA) 5 MG tablet take 2 tablets by mouth once daily 60 tablet 10  . budesonide-formoterol (SYMBICORT) 160-4.5 MCG/ACT inhaler Inhale 2 puffs into the lungs 2 (two) times daily. 1 Inhaler 6  . buPROPion (WELLBUTRIN XL) 150 MG 24 hr tablet take 1 tablet by mouth once daily 90 tablet 3  . chlorpheniramine (CHLOR-TRIMETON) 4 MG tablet 8mg  at night 14 tablet 0  . dofetilide (TIKOSYN) 250 MCG capsule Take 1 capsule (250 mcg total) by mouth 2 (two) times daily. 60 capsule 5  . fluticasone (FLONASE) 50 MCG/ACT nasal spray instill 1 spray into each nostril twice a day 16 g 5  . insulin aspart (NOVOLOG FLEXPEN) 100 UNIT/ML FlexPen Inject 5 Units into the skin 2 (two) times daily. Inject 5U with any meal containing 2 servings of carbs. 15 mL   . Insulin Pen Needle (B-D ULTRAFINE III SHORT PEN) 31G X 8 MM MISC Use as directed up to 4 times daily to check blood sugar 100 each 9  . LANTUS SOLOSTAR 100 UNIT/ML Solostar Pen inject 57  units subcutaneously at bedtime 15 mL 12  . levalbuterol (XOPENEX HFA) 45 MCG/ACT inhaler INHALE 1 TO 2 PUFFS BY MOUTH INTO THE LUNGS EVERY 4 HOURS IF NEEDED FOR WHEEZING 15 Inhaler 5  . levalbuterol (XOPENEX) 0.63 MG/3ML nebulizer solution One vial in nebulizer four times daily as needed dx 493.00 360 mL 4  . mometasone-formoterol (DULERA) 200-5 MCG/ACT AERO Inhale 2 puffs into the lungs 2 (two) times daily. 1 Inhaler 6  . montelukast (SINGULAIR) 10 MG tablet take 1 tablet by mouth at bedtime 30  tablet 6  . polyethylene glycol powder (GLYCOLAX/MIRALAX) powder take 17GM (DISSOLVED IN WATER) by mouth once daily THREE TIMES A WEEK if needed as directed 500 g 3  . potassium chloride SA (K-DUR,KLOR-CON) 20 MEQ tablet Take 1 tablet (20 mEq total) by mouth daily as directed. 30 tablet 11  . ranitidine (ZANTAC) 150 MG tablet Take 1 tablet (150 mg total) by mouth daily. 30 tablet 6  . rosuvastatin (CRESTOR) 10 MG tablet Take 1 tablet (10 mg total) by mouth daily. 90 tablet 3  . torsemide (DEMADEX) 20 MG tablet Take 1 tablet (20 mg total) by mouth twice daily as directed. 60 tablet 10  . warfarin (COUMADIN) 5 MG tablet take 1 to 1 and 1/2 tablets by mouth daily as directed BY COUMADIN CLINIC 45 tablet 1  . predniSONE (DELTASONE) 20 MG tablet Take 2 tablets (40 mg total) by mouth daily with breakfast. For four days (Patient not taking: Reported on 03/01/2017) 8 tablet 0   No current facility-administered medications for this visit.     Social Hx:  reports that  has never smoked. she has never used smokeless tobacco.   Objective:   BP 122/60   Pulse 70   Temp 98.2 F (36.8 C) (Oral)   Ht 5\' 6"  (1.676 m)   Wt 210 lb 12.8 oz (95.6 kg)   LMP 07/26/2011   SpO2 99%   BMI 34.02 kg/m  Physical Exam  Gen: NAD, alert, cooperative with exam, well-appearing Cardiac: Regular rate and rhythm, normal Respiratory: non-labored breathing Psych: good insight, normal mood and affect  Results for orders placed or performed in visit on 03/01/17  HgB A1c  Result Value Ref Range   Hemoglobin A1C 8.7     Assessment & Plan:  Type 2 diabetes, uncontrolled, with neuropathy (HCC) Uncontrolled but improving.  A1c decreased from 9.1 to 8.7.  Good fasting blood glucose but evening glucoses remain in mid 100s. inconsistent use of mealtime NovoLog. -Continue Lantus 57 units at night -Continue 5 units of NovoLog twice a day with biggest meals of the day -Not taking metformin secondary to GI  upset -Congratulated patient on lowering her A1c - Encourage patient to continue decreasing fried foods and carb heavy foods -Follow-up in 3 months for next diabetes check and A1c -Follow-up with Lauren for Medicare annual wellness visit as soon as her schedule permits  Orders Placed This Encounter  Procedures  . HgB A1c    Anders Simmondshristina Jarius Dieudonne, MD [facility], PGY-3

## 2017-03-01 ENCOUNTER — Ambulatory Visit (INDEPENDENT_AMBULATORY_CARE_PROVIDER_SITE_OTHER): Payer: Medicare Other | Admitting: Family Medicine

## 2017-03-01 ENCOUNTER — Encounter: Payer: Self-pay | Admitting: Family Medicine

## 2017-03-01 ENCOUNTER — Other Ambulatory Visit: Payer: Self-pay

## 2017-03-01 VITALS — BP 122/60 | HR 70 | Temp 98.2°F | Ht 66.0 in | Wt 210.8 lb

## 2017-03-01 DIAGNOSIS — E1140 Type 2 diabetes mellitus with diabetic neuropathy, unspecified: Secondary | ICD-10-CM

## 2017-03-01 DIAGNOSIS — E1165 Type 2 diabetes mellitus with hyperglycemia: Secondary | ICD-10-CM | POA: Diagnosis not present

## 2017-03-01 DIAGNOSIS — IMO0002 Reserved for concepts with insufficient information to code with codable children: Secondary | ICD-10-CM

## 2017-03-01 LAB — POCT GLYCOSYLATED HEMOGLOBIN (HGB A1C): Hemoglobin A1C: 8.7

## 2017-03-01 NOTE — Patient Instructions (Signed)
Thank you for coming in today, it was so nice to see you! Today we talked about:    Congrats on lowering your A1C! Continue exercising and eating less fried foods and carbohydrates and we can get to your goal A1C  Please follow up in 3 months with Coltr[facility] Tugwell for diabetes check and follow up within the next month with lauren for a medicare annual wellness visit. You can schedule this appointment at the front desk before you leave or call the clinic.  Bring in all your medications or supplements to each appointment for review.   If you have any questions or concerns, please do not hesitate to call the office at [phone]. You can also message me directly via MyChart.   Sincerely,  Anders Simmonds, MD

## 2017-03-05 NOTE — Assessment & Plan Note (Addendum)
Uncontrolled but improving.  A1c decreased from 9.1 to 8.7.  Good fasting blood glucose but evening glucoses remain in mid 100s. inconsistent use of mealtime NovoLog. -Continue Lantus 57 units at night -Continue 5 units of NovoLog twice a day with biggest meals of the day -Not taking metformin secondary to GI upset -Congratulated patient on lowering her A1c - Encourage patient to continue decreasing fried foods and carb heavy foods -Follow-up in 3 months for next diabetes check and A1c -Follow-up with Lauren for Medicare annual wellness visit as soon as her schedule permits

## 2017-03-08 ENCOUNTER — Ambulatory Visit (INDEPENDENT_AMBULATORY_CARE_PROVIDER_SITE_OTHER): Payer: Medicare Other

## 2017-03-08 DIAGNOSIS — I5022 Chronic systolic (congestive) heart failure: Secondary | ICD-10-CM | POA: Diagnosis not present

## 2017-03-08 DIAGNOSIS — Z95810 Presence of automatic (implantable) cardiac defibrillator: Secondary | ICD-10-CM

## 2017-03-09 NOTE — Progress Notes (Signed)
EPIC Encounter for ICM Monitoring  Patient Name: [name] is a 66 y.o. female Date: 03/09/2017 Primary Care Physican: Garth Bigness, MD Primary Cardiologist:Croitoru Electrophysiologist: Croitoru Dry Weight: 208-210 lbs  Clinical Status (23-Nov-2016 to 03-Mar-2017) Treated VT/VF 0 episodes  AT/AF 2547 episodes  Time in AT/AF 1.6 hr/day (6.7%)   Observations (2) (23-Nov-2016 to 03-Mar-2017)  AT/AF >= 6 hr for 2 days.      Heart Failure questions reviewed, pt asymptomatic.  She reported feeling fine at this time.    Thoracic impedance normal.  AT/AF Burden increased since 01/26/17.  Prescribed dosage: Torsemide 20 mg 1 tablet twice daily. Potassium 20 mEq 1 tablet daily  Recommendations: No changes.  Encouraged to call for fluid symptoms.  Follow-up plan: ICM clinic phone appointment on 04/08/2017.   Copy of ICM check sent to Dr. Royann Shivers.   3 month ICM trend: 03/09/2017    AT/AF     1 Year ICM trend:       [name], RN 03/09/2017 8:02 AM

## 2017-03-09 NOTE — Progress Notes (Signed)
Thank you MCr 

## 2017-03-11 DIAGNOSIS — H43821 Vitreomacular adhesion, right eye: Secondary | ICD-10-CM | POA: Diagnosis not present

## 2017-03-11 DIAGNOSIS — H43811 Vitreous degeneration, right eye: Secondary | ICD-10-CM | POA: Diagnosis not present

## 2017-03-11 DIAGNOSIS — H35363 Drusen (degenerative) of macula, bilateral: Secondary | ICD-10-CM | POA: Diagnosis not present

## 2017-03-11 DIAGNOSIS — H35411 Lattice degeneration of retina, right eye: Secondary | ICD-10-CM | POA: Diagnosis not present

## 2017-03-11 LAB — HM DIABETES EYE EXAM

## 2017-03-13 ENCOUNTER — Other Ambulatory Visit: Payer: Self-pay | Admitting: Family Medicine

## 2017-03-30 ENCOUNTER — Ambulatory Visit (INDEPENDENT_AMBULATORY_CARE_PROVIDER_SITE_OTHER): Payer: Medicare Other | Admitting: Pharmacist

## 2017-03-30 DIAGNOSIS — I4891 Unspecified atrial fibrillation: Secondary | ICD-10-CM

## 2017-03-30 DIAGNOSIS — Z7901 Long term (current) use of anticoagulants: Secondary | ICD-10-CM

## 2017-03-30 LAB — POCT INR: INR: 2

## 2017-04-01 ENCOUNTER — Telehealth: Payer: Self-pay | Admitting: Acute Care

## 2017-04-01 DIAGNOSIS — J4540 Moderate persistent asthma, uncomplicated: Secondary | ICD-10-CM

## 2017-04-01 MED ORDER — LEVALBUTEROL HCL 0.63 MG/3ML IN NEBU: INHALATION_SOLUTION | RESPIRATORY_TRACT | 2 refills | Status: DC

## 2017-04-01 NOTE — Telephone Encounter (Signed)
Also requesting Lovenox RX for nebulizer.  Pharm is [facility] on [address].

## 2017-04-01 NOTE — Telephone Encounter (Signed)
Spoke with pt. States that she is needing a new nebulizer machine and a prescription for Xopenex nebs. Order and Rx have been placed. Nothing further was needed.

## 2017-04-08 ENCOUNTER — Ambulatory Visit (INDEPENDENT_AMBULATORY_CARE_PROVIDER_SITE_OTHER): Payer: Medicare Other | Admitting: *Deleted

## 2017-04-08 ENCOUNTER — Other Ambulatory Visit: Payer: Self-pay

## 2017-04-08 ENCOUNTER — Encounter: Payer: Self-pay | Admitting: *Deleted

## 2017-04-08 DIAGNOSIS — Z95810 Presence of automatic (implantable) cardiac defibrillator: Secondary | ICD-10-CM | POA: Diagnosis not present

## 2017-04-08 DIAGNOSIS — I5022 Chronic systolic (congestive) heart failure: Secondary | ICD-10-CM

## 2017-04-08 DIAGNOSIS — Z0000 Encounter for general adult medical examination without abnormal findings: Secondary | ICD-10-CM

## 2017-04-08 DIAGNOSIS — I472 Ventricular tachycardia, unspecified: Secondary | ICD-10-CM

## 2017-04-08 DIAGNOSIS — Z23 Encounter for immunization: Secondary | ICD-10-CM

## 2017-04-08 NOTE — Patient Instructions (Signed)
 Diabetes and Foot Care Diabetes may cause you to have problems because of poor blood supply (circulation) to your feet and legs. This may cause the skin on your feet to become thinner, break easier, and heal more slowly. Your skin may become dry, and the skin may peel and crack. You may also have nerve damage in your legs and feet causing decreased feeling in them. You may not notice minor injuries to your feet that could lead to infections or more serious problems. Taking care of your feet is one of the most important things you can do for yourself. Follow these instructions at home:  Wear shoes at all times, even in the house. Do not go barefoot. Bare feet are easily injured.  Check your feet daily for blisters, cuts, and redness. If you cannot see the bottom of your feet, use a mirror or ask someone for help.  Wash your feet with warm water (do not use hot water) and mild soap. Then pat your feet and the areas between your toes until they are completely dry. Do not soak your feet as this can dry your skin.  Apply a moisturizing lotion or petroleum jelly (that does not contain alcohol and is unscented) to the skin on your feet and to dry, brittle toenails. Do not apply lotion between your toes.  Trim your toenails straight across. Do not dig under them or around the cuticle. File the edges of your nails with an emery board or nail file.  Do not cut corns or calluses or try to remove them with medicine.  Wear clean socks or stockings every day. Make sure they are not too tight. Do not wear knee-high stockings since they may decrease blood flow to your legs.  Wear shoes that fit properly and have enough cushioning. To break in new shoes, wear them for just a few hours a day. This prevents you from injuring your feet. Always look in your shoes before you put them on to be sure there are no objects inside.  Do not cross your legs. This may decrease the blood flow to your feet.  If you find a  minor scrape, cut, or break in the skin on your feet, keep it and the skin around it clean and dry. These areas may be cleansed with mild soap and water. Do not cleanse the area with peroxide, alcohol, or iodine.  When you remove an adhesive bandage, be sure not to damage the skin around it.  If you have a wound, look at it several times a day to make sure it is healing.  Do not use heating pads or hot water bottles. They may burn your skin. If you have lost feeling in your feet or legs, you may not know it is happening until it is too late.  Make sure your health care provider performs a complete foot exam at least annually or more often if you have foot problems. Report any cuts, sores, or bruises to your health care provider immediately. Contact a health care provider if:  You have an injury that is not healing.  You have cuts or breaks in the skin.  You have an ingrown nail.  You notice redness on your legs or feet.  You feel burning or tingling in your legs or feet.  You have pain or cramps in your legs and feet.  Your legs or feet are numb.  Your feet always feel cold. Get help right away if:  There is   increasing redness, swelling, or pain in or around a wound.  There is a red line that goes up your leg.  Pus is coming from a wound.  You develop a fever or as directed by your health care provider.  You notice a bad smell coming from an ulcer or wound. This information is not intended to replace advice given to you by your health care provider. Make sure you discuss any questions you have with your health care provider. Document Released: 03/27/2000 Document Revised: 09/05/2015 Document Reviewed: 09/06/2012 Elsevier Interactive Patient Education  2017 [place] Prevention in the Home Falls can cause injuries. They can happen to people of all ages. There are many things you can do to make your home safe and to help prevent falls. What can I do on the  outside of my home?  Regularly fix the edges of walkways and driveways and fix any cracks.  Remove anything that might make you trip as you walk through a door, such as a raised step or threshold.  Trim any bushes or trees on the path to your home.  Use bright outdoor lighting.  Clear any walking paths of anything that might make someone trip, such as rocks or tools.  Regularly check to see if handrails are loose or broken. Make sure that both sides of any steps have handrails.  Any raised decks and porches should have guardrails on the edges.  Have any leaves, snow, or ice cleared regularly.  Use sand or salt on walking paths during winter.  Clean up any spills in your garage right away. This includes oil or grease spills. What can I do in the bathroom?  Use night lights.  Install grab bars by the toilet and in the tub and shower. Do not use towel bars as grab bars.  Use non-skid mats or decals in the tub or shower.  If you need to sit down in the shower, use a plastic, non-slip stool.  Keep the floor dry. Clean up any water that spills on the floor as soon as it happens.  Remove soap buildup in the tub or shower regularly.  Attach bath mats securely with double-sided non-slip rug tape.  Do not have throw rugs and other things on the floor that can make you trip. What can I do in the bedroom?  Use night lights.  Make sure that you have a light by your bed that is easy to reach.  Do not use any sheets or blankets that are too big for your bed. They should not hang down onto the floor.  Have a firm chair that has side arms. You can use this for support while you get dressed.  Do not have throw rugs and other things on the floor that can make you trip. What can I do in the kitchen?  Clean up any spills right away.  Avoid walking on wet floors.  Keep items that you use a lot in easy-to-reach places.  If you need to reach something above you, use a strong step  stool that has a grab bar.  Keep electrical cords out of the way.  Do not use floor polish or wax that makes floors slippery. If you must use wax, use non-skid floor wax.  Do not have throw rugs and other things on the floor that can make you trip. What can I do with my stairs?  Do not leave any items on the stairs.  Make sure that there  are handrails on both sides of the stairs and use them. Fix handrails that are broken or loose. Make sure that handrails are as long as the stairways.  Check any carpeting to make sure that it is firmly attached to the stairs. Fix any carpet that is loose or worn.  Avoid having throw rugs at the top or bottom of the stairs. If you do have throw rugs, attach them to the floor with carpet tape.  Make sure that you have a light switch at the top of the stairs and the bottom of the stairs. If you do not have them, ask someone to add them for you. What else can I do to help prevent falls?  Wear shoes that: ? Do not have high heels. ? Have rubber bottoms. ? Are comfortable and fit you well. ? Are closed at the toe. Do not wear sandals.  If you use a stepladder: ? Make sure that it is fully opened. Do not climb a closed stepladder. ? Make sure that both sides of the stepladder are locked into place. ? Ask someone to hold it for you, if possible.  Clearly mark and make sure that you can see: ? Any grab bars or handrails. ? First and last steps. ? Where the edge of each step is.  Use tools that help you move around (mobility aids) if they are needed. These include: ? Canes. ? Walkers. ? Scooters. ? Crutches.  Turn on the lights when you go into a dark area. Replace any light bulbs as soon as they burn out.  Set up your furniture so you have a clear path. Avoid moving your furniture around.  If any of your floors are uneven, fix them.  If there are any pets around you, be aware of where they are.  Review your medicines with your doctor. Some  medicines can make you feel dizzy. This can increase your chance of falling. Ask your doctor what other things that you can do to help prevent falls. This information is not intended to replace advice given to you by your health care provider. Make sure you discuss any questions you have with your health care provider. Document Released: 01/24/2009 Document Revised: 09/05/2015 Document Reviewed: 05/04/2014 Elsevier Interactive Patient Education  2018 [place] Maintenance, Female Adopting a healthy lifestyle and getting preventive care can go a long way to promote health and wellness. Talk with your health care provider about what schedule of regular examinations is right for you. This is a good chance for you to check in with your provider about disease prevention and staying healthy. In between checkups, there are plenty of things you can do on your own. Experts have done a lot of research about which lifestyle changes and preventive measures are most likely to keep you healthy. Ask your health care provider for more information. Weight and diet Eat a healthy diet  Be sure to include plenty of vegetables, fruits, low-fat dairy products, and lean protein.  Do not eat a lot of foods high in solid fats, added sugars, or salt.  Get regular exercise. This is one of the most important things you can do for your health. ? Most adults should exercise for at least 150 minutes each week. The exercise should increase your heart rate and make you sweat (moderate-intensity exercise). ? Most adults should also do strengthening exercises at least twice a week. This is in addition to the moderate-intensity exercise.  Maintain a healthy weight  Body mass index (BMI) is a measurement that can be used to identify possible weight problems. It estimates body fat based on height and weight. Your health care provider can help determine your BMI and help you achieve or maintain a healthy weight.  For  females 86 years of age and older: ? A BMI below 18.5 is considered underweight. ? A BMI of 18.5 to 24.9 is normal. ? A BMI of 25 to 29.9 is considered overweight. ? A BMI of 30 and above is considered obese.  Watch levels of cholesterol and blood lipids  You should start having your blood tested for lipids and cholesterol at 65 years of age, then have this test every 5 years.  You may need to have your cholesterol levels checked more often if: ? Your lipid or cholesterol levels are high. ? You are older than 65 years of age. ? You are at high risk for heart disease.  Cancer screening Lung Cancer  Lung cancer screening is recommended for adults 45-74 years old who are at high risk for lung cancer because of a history of smoking.  A yearly low-dose CT scan of the lungs is recommended for people who: ? Currently smoke. ? Have quit within the past 15 years. ? Have at least a 30-pack-year history of smoking. A pack year is smoking an average of one pack of cigarettes a day for 1 year.  Yearly screening should continue until it has been 15 years since you quit.  Yearly screening should stop if you develop a health problem that would prevent you from having lung cancer treatment.  Breast Cancer  Practice breast self-awareness. This means understanding how your breasts normally appear and feel.  It also means doing regular breast self-exams. Let your health care provider know about any changes, no matter how small.  If you are in your 20s or 30s, you should have a clinical breast exam (CBE) by a health care provider every 1-3 years as part of a regular health exam.  If you are 75 or older, have a CBE every year. Also consider having a breast X-ray (mammogram) every year.  If you have a family history of breast cancer, talk to your health care provider about genetic screening.  If you are at high risk for breast cancer, talk to your health care provider about having an MRI and a  mammogram every year.  Breast cancer gene (BRCA) assessment is recommended for women who have family members with BRCA-related cancers. BRCA-related cancers include: ? Breast. ? Ovarian. ? Tubal. ? Peritoneal cancers.  Results of the assessment will determine the need for genetic counseling and BRCA1 and BRCA2 testing.  Cervical Cancer Your health care provider may recommend that you be screened regularly for cancer of the pelvic organs (ovaries, uterus, and vagina). This screening involves a pelvic examination, including checking for microscopic changes to the surface of your cervix (Pap test). You may be encouraged to have this screening done every 3 years, beginning at age 77.  For women ages 37-65, health care providers may recommend pelvic exams and Pap testing every 3 years, or they may recommend the Pap and pelvic exam, combined with testing for human papilloma virus (HPV), every 5 years. Some types of HPV increase your risk of cervical cancer. Testing for HPV may also be done on women of any age with unclear Pap test results.  Other health care providers may not recommend any screening for nonpregnant women who are considered low risk  for pelvic cancer and who do not have symptoms. Ask your health care provider if a screening pelvic exam is right for you.  If you have had past treatment for cervical cancer or a condition that could lead to cancer, you need Pap tests and screening for cancer for at least 20 years after your treatment. If Pap tests have been discontinued, your risk factors (such as having a new sexual partner) need to be reassessed to determine if screening should resume. Some women have medical problems that increase the chance of getting cervical cancer. In these cases, your health care provider may recommend more frequent screening and Pap tests.  Colorectal Cancer  This type of cancer can be detected and often prevented.  Routine colorectal cancer screening usually  begins at 65 years of age and continues through 65 years of age.  Your health care provider may recommend screening at an earlier age if you have risk factors for colon cancer.  Your health care provider may also recommend using home test kits to check for hidden blood in the stool.  A small camera at the end of a tube can be used to examine your colon directly (sigmoidoscopy or colonoscopy). This is done to check for the earliest forms of colorectal cancer.  Routine screening usually begins at age 79.  Direct examination of the colon should be repeated every 5-10 years through 65 years of age. However, you may need to be screened more often if early forms of precancerous polyps or small growths are found.  Skin Cancer  Check your skin from head to toe regularly.  Tell your health care provider about any new moles or changes in moles, especially if there is a change in a mole's shape or color.  Also tell your health care provider if you have a mole that is larger than the size of a pencil eraser.  Always use sunscreen. Apply sunscreen liberally and repeatedly throughout the day.  Protect yourself by wearing long sleeves, pants, a wide-brimmed hat, and sunglasses whenever you are outside.  Heart disease, diabetes, and high blood pressure  High blood pressure causes heart disease and increases the risk of stroke. High blood pressure is more likely to develop in: ? People who have blood pressure in the high end of the normal range (130-139/85-89 mm Hg). ? People who are overweight or obese. ? People who are African American.  If you are 13-10 years of age, have your blood pressure checked every 3-5 years. If you are 15 years of age or older, have your blood pressure checked every year. You should have your blood pressure measured twice-once when you are at a hospital or clinic, and once when you are not at a hospital or clinic. Record the average of the two measurements. To check your  blood pressure when you are not at a hospital or clinic, you can use: ? An automated blood pressure machine at a pharmacy. ? A home blood pressure monitor.  If you are between 62 years and 34 years old, ask your health care provider if you should take aspirin to prevent strokes.  Have regular diabetes screenings. This involves taking a blood sample to check your fasting blood sugar level. ? If you are at a normal weight and have a low risk for diabetes, have this test once every three years after 65 years of age. ? If you are overweight and have a high risk for diabetes, consider being tested at a younger age or  more often. Preventing infection Hepatitis B  If you have a higher risk for hepatitis B, you should be screened for this virus. You are considered at high risk for hepatitis B if: ? You were born in a country where hepatitis B is common. Ask your health care provider which countries are considered high risk. ? Your parents were born in a high-risk country, and you have not been immunized against hepatitis B (hepatitis B vaccine). ? You have HIV or AIDS. ? You use needles to inject street drugs. ? You live with someone who has hepatitis B. ? You have had sex with someone who has hepatitis B. ? You get hemodialysis treatment. ? You take certain medicines for conditions, including cancer, organ transplantation, and autoimmune conditions.  Hepatitis C  Blood testing is recommended for: ? Everyone born from [birth year] through 1964. ? Anyone with known risk factors for hepatitis C.  Sexually transmitted infections (STIs)  You should be screened for sexually transmitted infections (STIs) including gonorrhea and chlamydia if: ? You are sexually active and are younger than 65 years of age. ? You are older than 65 years of age and your health care provider tells you that you are at risk for this type of infection. ? Your sexual activity has changed since you were last screened and you are at  an increased risk for chlamydia or gonorrhea. Ask your health care provider if you are at risk.  If you do not have HIV, but are at risk, it may be recommended that you take a prescription medicine daily to prevent HIV infection. This is called pre-exposure prophylaxis (PrEP). You are considered at risk if: ? You are sexually active and do not regularly use condoms or know the HIV status of your partner(s). ? You take drugs by injection. ? You are sexually active with a partner who has HIV.  Talk with your health care provider about whether you are at high risk of being infected with HIV. If you choose to begin PrEP, you should first be tested for HIV. You should then be tested every 3 months for as long as you are taking PrEP. Pregnancy  If you are premenopausal and you may become pregnant, ask your health care provider about preconception counseling.  If you may become pregnant, take 400 to 800 micrograms (mcg) of folic acid every day.  If you want to prevent pregnancy, talk to your health care provider about birth control (contraception). Osteoporosis and menopause  Osteoporosis is a disease in which the bones lose minerals and strength with aging. This can result in serious bone fractures. Your risk for osteoporosis can be identified using a bone density scan.  If you are 52 years of age or older, or if you are at risk for osteoporosis and fractures, ask your health care provider if you should be screened.  Ask your health care provider whether you should take a calcium or vitamin D supplement to lower your risk for osteoporosis.  Menopause may have certain physical symptoms and risks.  Hormone replacement therapy may reduce some of these symptoms and risks. Talk to your health care provider about whether hormone replacement therapy is right for you. Follow these instructions at home:  Schedule regular health, dental, and eye exams.  Stay current with your immunizations.  Do not  use any tobacco products including cigarettes, chewing tobacco, or electronic cigarettes.  If you are pregnant, do not drink alcohol.  If you are breastfeeding, limit how much and how  often you drink alcohol.  Limit alcohol intake to no more than 1 drink per day for nonpregnant women. One drink equals 12 ounces of beer, 5 ounces of wine, or 1 ounces of hard liquor.  Do not use street drugs.  Do not share needles.  Ask your health care provider for help if you need support or information about quitting drugs.  Tell your health care provider if you often feel depressed.  Tell your health care provider if you have ever been abused or do not feel safe at home. This information is not intended to replace advice given to you by your health care provider. Make sure you discuss any questions you have with your health care provider. Document Released: 10/13/2010 Document Revised: 09/05/2015 Document Reviewed: 01/01/2015 Elsevier Interactive Patient Education  [employer].

## 2017-04-08 NOTE — Progress Notes (Signed)
Remote ICD transmission.   

## 2017-04-08 NOTE — Progress Notes (Signed)
Subjective:   [name] is a 66 y.o. female who presents for an Initial Medicare Annual Wellness Visit.   Cardiac Risk Factors include: advanced age (>10655men, 39>65 women);diabetes mellitus;dyslipidemia;hypertension;obesity (BMI >30kg/m2);sedentary lifestyle     Objective:    Today's Vitals   04/08/17 0908  BP: 120/76  Pulse: 73  Temp: 98.1 F (36.7 C)  TempSrc: Oral  SpO2: 99%  Weight: 210 lb 12.8 oz (95.6 kg)  Height: 5\' 6"  (1.676 m)  PainSc: 4   PainLoc: Knee   Body mass index is 34.02 kg/m.  Advanced Directives 04/08/2017 12/04/2016 08/18/2016 04/21/2016 03/08/2016 01/20/2016 12/31/2015  Does Patient Have a Medical Advance Directive? Yes No No Yes Yes Yes Yes  Type of Estate agentAdvance Directive Healthcare Power of [place];Living will - - Living will - Living will Healthcare Power of [place];Living will  Does patient want to make changes to medical advance directive? No - Patient declined - - - (No Data) No - Patient declined No - Patient declined  Copy of Healthcare Power of Attorney in Chart? Yes - - - - Yes Yes  Would patient like information on creating a medical advance directive? - No - Patient declined No - Patient declined - - - -  Pre-existing out of facility DNR order (yellow form or pink MOST form) - - - - - - -    Current Medications (verified) Outpatient Encounter Medications as of 04/08/2017  Medication Sig  . B-D ULTRAFINE III SHORT PEN 31G X 8 MM MISC use as directed  . bisoprolol (ZEBETA) 5 MG tablet take 2 tablets by mouth once daily  . budesonide-formoterol (SYMBICORT) 160-4.5 MCG/ACT inhaler Inhale 2 puffs into the lungs 2 (two) times daily.  Marland Kitchen. buPROPion (WELLBUTRIN XL) 150 MG 24 hr tablet take 1 tablet by mouth once daily  . chlorpheniramine (CHLOR-TRIMETON) 4 MG tablet 8mg  at night  . dofetilide (TIKOSYN) 250 MCG capsule Take 1 capsule (250 mcg total) by mouth 2 (two) times daily.  . fluticasone (FLONASE) 50 MCG/ACT nasal spray instill 1 spray into each  nostril twice a day  . insulin aspart (NOVOLOG FLEXPEN) 100 UNIT/ML FlexPen Inject 5 Units into the skin 2 (two) times daily. Inject 5U with any meal containing 2 servings of carbs.  . Insulin Pen Needle (B-D ULTRAFINE III SHORT PEN) 31G X 8 MM MISC Use as directed up to 4 times daily to check blood sugar  . LANTUS SOLOSTAR 100 UNIT/ML Solostar Pen inject 57 units subcutaneously at bedtime  . levalbuterol (XOPENEX HFA) 45 MCG/ACT inhaler INHALE 1 TO 2 PUFFS BY MOUTH INTO THE LUNGS EVERY 4 HOURS IF NEEDED FOR WHEEZING  . levalbuterol (XOPENEX) 0.63 MG/3ML nebulizer solution One vial in nebulizer four times daily as needed  . mometasone-formoterol (DULERA) 200-5 MCG/ACT AERO Inhale 2 puffs into the lungs 2 (two) times daily.  . montelukast (SINGULAIR) 10 MG tablet take 1 tablet by mouth at bedtime  . polyethylene glycol powder (GLYCOLAX/MIRALAX) powder take 17GM (DISSOLVED IN WATER) by mouth once daily THREE TIMES A WEEK if needed as directed  . potassium chloride SA (K-DUR,KLOR-CON) 20 MEQ tablet Take 1 tablet (20 mEq total) by mouth daily as directed.  . ranitidine (ZANTAC) 150 MG tablet Take 1 tablet (150 mg total) by mouth daily.  . rosuvastatin (CRESTOR) 10 MG tablet Take 1 tablet (10 mg total) by mouth daily.  Marland Kitchen. torsemide (DEMADEX) 20 MG tablet Take 1 tablet (20 mg total) by mouth twice daily as directed.  . warfarin (COUMADIN) 5  MG tablet take 1 to 1 and 1/2 tablets by mouth daily as directed BY COUMADIN CLINIC  . predniSONE (DELTASONE) 20 MG tablet Take 2 tablets (40 mg total) by mouth daily with breakfast. For four days (Patient not taking: Reported on 03/01/2017)   No facility-administered encounter medications on file as of 04/08/2017.     Allergies (verified) Avelox [moxifloxacin hcl in nacl]; Simvastatin; Ace inhibitors; and Latex   History: Past Medical History:  Diagnosis Date  . AICD (automatic cardioverter/defibrillator) present    Medtronic  . Anemia   . Anxiety   .  Arthritis   . Asthma    has had multiple hospitalizations for this  . Atrial fibrillation (HCC)    ablation x 2 [facility], 01/2006, 2011.  on warfarin  . Cardiac arrest [facility]) jan 2012   in hospital for pneumonia when this occured- occured at the hospital  . CHF (congestive heart failure) (HCC) 2012   Echo 08/08/10 by [facility]. EF 35-45%. LV systolic function moderately reduced. Moderate global hypokinesis of LV.  RV systolic function moderately reduced. Mild MR. Trace AR.  Marland Kitchen Complication of anesthesia    difficult time waking up after anesthesia  . Depression   . Diabetes mellitus    Type 2  . Dysrhythmia    Atrial fibrillation  . GASTROESOPHAGEAL REFLUX, NO ESOPHAGITIS 06/10/2006   Qualifier: Diagnosis of  By: Abundio Miu    . GERD (gastroesophageal reflux disease)   . History of hiatal hernia   . Hyperlipidemia   . Hypertension   . Limb pain 06/04/2008   LLE, Baker's cyst in popliteal fossa, no DVT  . Non-ischemic cardiomyopathy (HCC)    echo 08/08/10 - EF 35-45% LV and RV systolic function mod reduced  . Primary localized osteoarthritis of left knee   . Primary localized osteoarthritis of right knee   . Primary localized osteoarthritis of right knee   . Sleep apnea    wears CPAP nightly  . Vocal cord disease   . Wears glasses    Past Surgical History:  Procedure Laterality Date  . BREAST EXCISIONAL BIOPSY Right 1999  . BREAST LUMPECTOMY Right   . CARDIAC DEFIBRILLATOR PLACEMENT  05/02/10   Medtronic Protecta XT-DR for CHF-VT, last download 04/12/12  . CHOLECYSTECTOMY    . COLONOSCOPY    . HAMMER TOE SURGERY Right   . KNEE ARTHROSCOPY Right   . PAF Ablation     By Dr Sampson Goon. Now sees Dr Steele Berg [facility]  . TOTAL KNEE ARTHROPLASTY Left 09/17/2014  . TOTAL KNEE ARTHROPLASTY Left 09/17/2014   Procedure: TOTAL KNEE ARTHROPLASTY;  Surgeon: Salvatore Marvel, MD;  Location: [facility] OR;  Service: Orthopedics;  Laterality: Left;  pt has ICD  . TUBAL LIGATION     Family  History  Problem Relation Age of Onset  . Diabetes Father   . Hypertension Father   . Diabetes Brother   . Hypertension Brother   . Lung disease Neg Hx   . Cancer Neg Hx   . Rheumatologic disease Neg Hx    Social History   Socioeconomic History  . Marital status: Widowed    Spouse name: None  . Number of children: None  . Years of education: None  . Highest education level: None  Social Needs  . Financial resource strain: None  . Food insecurity - worry: None  . Food insecurity - inability: None  . Transportation needs - medical: None  . Transportation needs - non-medical: None  Occupational  History  . None  Tobacco Use  . Smoking status: Never Smoker  . Smokeless tobacco: Never Used  Substance and Sexual Activity  . Alcohol use: No  . Drug use: No  . Sexual activity: No  Other Topics Concern  . None  Social History Narrative   Not employed   Exercise- walking 1 hour daily.    Diet- eating healthy diet.       [name]:   She is originally from [place]. Has always lived in [place]. 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 Currently has a small dog. No bird or mold exposure.       Current Social History 04/08/2017        Patient lives with daughter, Lafonda Mosses, in two level home 04/08/2017   Transportation: Patient has own vehicle and drives herself 04/08/2017   Important Relationships Daughter, Lafonda Mosses 04/08/2017    Pets: Shiatzu Evaristo Bury)  04/08/2017   Education / Work:  10 th grade/ None 04/08/2017   Interests / Fun: Read, do puzzles 04/08/2017   Current Stressors: None because she prays to God 04/08/2017   Religious / Personal Beliefs: Non-Denominational 04/08/2017   L. Thursa Emme, RN, BSN                                                                                                  Tobacco Counseling Patient has never smoked and has no plans to  start.  Clinical Intake:  Pre-visit preparation completed: Yes  Pain : 0-10 Pain Score: 4  Pain Type: Chronic pain Pain Location: Knee Pain Orientation: Right Pain Descriptors / Indicators: Sharp Pain Onset: (1988) Pain Frequency: Intermittent Pain Relieving Factors: Tylenol Effect of Pain on Daily Activities: No  Pain Relieving Factors: Tylenol  Nutritional Status: BMI > 30  Obese Diabetes: Yes CBG done?: No Did pt. bring in CBG monitor from home?: No   Interpreter Needed?: No   Activities of Daily Living In your present state of health, do you have any difficulty performing the following activities: 04/08/2017  Hearing? N  Vision? N  Difficulty concentrating or making decisions? N  Walking or climbing stairs? Y  Dressing or bathing? N  Doing errands, shopping? N  Preparing Food and eating ? N  Using the Toilet? N  In the past six months, have you accidently leaked urine? N  Do you have problems with loss of bowel control? N  Managing your Medications? N  Managing your Finances? N  Housekeeping or managing your Housekeeping? N  Some recent data might be hidden   Home Safety:  My home has a working smoke alarm:  Yes X 3           My home throw rugs have been fastened down to the floor or removed:  Removed I have a non-slip surface or non-slip mats in the bathtub and shower:  Mat        All my home's stairs have handrails, including any outdoor stairs  Two level home with handrails inside  and 1 outside step without handrail           My home's floors, stairs and hallways are free from clutter, wires and cords:  Yes     I have animals in my home  Yes, Shiatzu Evaristo Bury) I wear seatbelts consistently:  Yes    Immunizations and Health Maintenance Immunization History  Administered Date(s) Administered  . H1N1 03/23/2008  . Influenza Split 01/06/2011, 01/18/2012  . Influenza Whole 01/09/2008, 01/21/2009, 01/14/2010  . Influenza,inj,Quad PF,6+ Mos 12/21/2012,  01/04/2014, 01/14/2015, 12/06/2015, 12/04/2016  . Pneumococcal Conjugate-13 09/25/2013  . Pneumococcal Polysaccharide-23 01/11/2001, 02/22/2012, 04/08/2017  . Td 01/11/2001  . Tdap 02/22/2012   Health Maintenance Due  Topic Date Due  . OPHTHALMOLOGY EXAM  12/05/2016  Pneumovax administered today Patient had foot exam done at Dr. Theotis Burrow office on 02/23/2017. In Epic Had eye exam 2 weeks ago with Dr. Arlys John.   ROI signed to send for records.  Patient Care Team: Garth Bigness, MD as PCP - General (Family Medicine) Storm Frisk, MD as Attending Physician (Pulmonary Disease) Carrington Clamp, DPM as Consulting Physician (Podiatry) Lucille Passy, MD as Referring Physician (Ophthalmology) Croitoru, Rachelle Hora, MD as Consulting Physician (Cardiology)  Indicate any recent Medical Services you may have received from other than [facility] providers in the past year (date may be approximate).     Assessment:   This is a routine wellness examination for [facility].  Hearing/Vision screen  Hearing Screening   Method: Audiometry   125Hz  250Hz  500Hz  1000Hz  2000Hz  3000Hz  4000Hz  6000Hz  8000Hz   Right ear:   Fail Fail 40  Fail    Left ear:   40 40 40  40      Dietary issues and exercise activities discussed: Current Exercise Habits: Home exercise routine, Type of exercise: walking, Time (Minutes): 30(Walks 2 laps around mall daily), Frequency (Times/Week): 7, Weekly Exercise (Minutes/Week): 210, Intensity: Moderate, Exercise limited by: cardiac condition(s);orthopedic condition(s);respiratory conditions(s)  Goals    . HEMOGLOBIN A1C < 8 (pt-stated)      Depression Screen PHQ 2/9 Scores 04/08/2017 03/01/2017 12/04/2016 08/18/2016 04/21/2016 01/27/2016 01/20/2016  PHQ - 2 Score 0 0 0 0 0 0 0    Fall Risk Fall Risk  04/08/2017 03/01/2017 12/04/2016 08/18/2016 01/27/2016  Falls in the past year? No No No No No   TUG Test:  Done in 15 seconds. Patient used both hands to push out of chair and  to sit back down. Falls prevention discussed in detail and literature given.  Cognitive Function: Mini-Cog  Passed with score 3/5   Screening Tests Health Maintenance  Topic Date Due  . OPHTHALMOLOGY EXAM  12/05/2016  . URINE MICROALBUMIN  01/18/2021 (Originally 05/15/1960)  . HEMOGLOBIN A1C  08/29/2017  . FOOT EXAM  04/08/2018  . MAMMOGRAM  06/02/2018  . PAP SMEAR  09/02/2018  . COLONOSCOPY  09/16/2020  . TETANUS/TDAP  02/21/2022  . INFLUENZA VACCINE  Completed  . DEXA SCAN  Completed  . Hepatitis C Screening  Completed  . HIV Screening  Completed  . PNA vac Low Risk Adult  Completed    Qualifies for Shingles Vaccine? Yes  Cancer Screenings: Breast: Up to date on Mammogram? Yes   Up to date of Bone Density/Dexa? Yes       Plan:     I have personally reviewed and noted the following in the patient's chart:   . Medical and social history . Use of alcohol, tobacco or illicit drugs  . Current medications and supplements .  Functional ability and status . Physical activity . Advanced directives . List of other physicians . Hospitalizations, surgeries, and ER visits in previous 12 months . Vitals . Screenings to include cognitive, depression, and falls . Referrals and appointments  In addition, I have reviewed and discussed with patient certain preventive protocols, quality metrics, and best practice recommendations. A written personalized care plan for preventive services as well as general preventive health recommendations were provided to patient.     Fredderick Severance, RN   04/08/2017

## 2017-04-08 NOTE — Progress Notes (Signed)
EPIC Encounter for ICM Monitoring  Patient Name: [name] is a 66 y.o. female Date: 04/08/2017 Primary Care Physican: Garth Bigness, MD Primary Cardiologist:Croitoru Electrophysiologist: Croitoru Dry Weight: 210 lbs   Clinical Status (08-Apr-2017 to 08-Apr-2017) Time in AT/AF (11.4%)  Longest AT/AF 13 minutes AT/AF 12 episodes      Heart Failure questions reviewed, pt asymptomatic.  She has been feeling good.    Thoracic impedance normal.  Prescribed dosage: Torsemide 20 mg 1 tablet twice daily. Potassium 20 mEq 1 tablet daily  Recommendations: No changes.   Encouraged to call for fluid symptoms.  Follow-up plan: ICM clinic phone appointment on 05/10/2017.    Copy of ICM check sent to Dr. Royann Shivers.   3 month ICM trend: 04/08/2017   1 Year ICM trend:       [name], RN 04/08/2017 2:13 PM

## 2017-04-09 ENCOUNTER — Encounter: Payer: Self-pay | Admitting: Cardiology

## 2017-04-18 ENCOUNTER — Other Ambulatory Visit: Payer: Self-pay | Admitting: Cardiovascular Disease

## 2017-04-23 LAB — CUP PACEART REMOTE DEVICE CHECK
Brady Statistic AP VS Percent: 64.36 %
Brady Statistic AS VS Percent: 15.92 %
Brady Statistic RV Percent Paced: 18.34 %
Date Time Interrogation Session: 20181227072826
HighPow Impedance: 342 Ohm
HighPow Impedance: 71 Ohm
Implantable Lead Implant Date: 20120120
Implantable Lead Location: 753859
Implantable Lead Location: 753860
Implantable Lead Model: 5076
Lead Channel Impedance Value: 418 Ohm
Lead Channel Impedance Value: 418 Ohm
Lead Channel Pacing Threshold Amplitude: 0.625 V
Lead Channel Pacing Threshold Pulse Width: 0.4 ms
Lead Channel Sensing Intrinsic Amplitude: 1.5 mV
Lead Channel Setting Pacing Amplitude: 2.5 V
Lead Channel Setting Pacing Pulse Width: 0.4 ms
MDC IDC LEAD IMPLANT DT: 20120120
MDC IDC MSMT BATTERY VOLTAGE: 2.62 V
MDC IDC MSMT LEADCHNL RA PACING THRESHOLD AMPLITUDE: 0.5 V
MDC IDC MSMT LEADCHNL RA PACING THRESHOLD PULSEWIDTH: 0.4 ms
MDC IDC MSMT LEADCHNL RA SENSING INTR AMPL: 1.5 mV
MDC IDC MSMT LEADCHNL RV SENSING INTR AMPL: 13.25 mV
MDC IDC MSMT LEADCHNL RV SENSING INTR AMPL: 13.25 mV
MDC IDC PG IMPLANT DT: 20120120
MDC IDC SET LEADCHNL RA PACING AMPLITUDE: 2 V
MDC IDC SET LEADCHNL RV SENSING SENSITIVITY: 0.3 mV
MDC IDC STAT BRADY AP VP PERCENT: 18.98 %
MDC IDC STAT BRADY AS VP PERCENT: 0.74 %
MDC IDC STAT BRADY RA PERCENT PACED: 77.23 %

## 2017-04-27 ENCOUNTER — Encounter: Payer: Self-pay | Admitting: *Deleted

## 2017-04-27 NOTE — Progress Notes (Signed)
Subjective:   [name] is a 67 y.o. female who presents for an Initial Medicare Annual Wellness Visit.   Cardiac Risk Factors include: advanced age (>68men, >49 women);diabetes mellitus;dyslipidemia;hypertension;obesity (BMI >30kg/m2);sedentary lifestyle     Objective:    Today's Vitals   04/08/17 0908  BP: 120/76  Pulse: 73  Temp: 98.1 F (36.7 C)  TempSrc: Oral  SpO2: 99%  Weight: 210 lb 12.8 oz (95.6 kg)  Height: 5\' 6"  (1.676 m)  PainSc: 4   PainLoc: Knee   Body mass index is 34.02 kg/m.  Advanced Directives 04/08/2017 12/04/2016 08/18/2016 04/21/2016 03/08/2016 01/20/2016 12/31/2015  Does Patient Have a Medical Advance Directive? Yes No No Yes Yes Yes Yes  Type of Estate agent of [place];Living will - - Living will - Living will Healthcare Power of [place];Living will  Does patient want to make changes to medical advance directive? No - Patient declined - - - (No Data) No - Patient declined No - Patient declined  Copy of Healthcare Power of Attorney in Chart? Yes - - - - Yes Yes  Would patient like information on creating a medical advance directive? - No - Patient declined No - Patient declined - - - -  Pre-existing out of facility DNR order (yellow form or pink MOST form) - - - - - - -    Current Medications (verified) Outpatient Encounter Medications as of 04/08/2017  Medication Sig  . B-D ULTRAFINE III SHORT PEN 31G X 8 MM MISC use as directed  . bisoprolol (ZEBETA) 5 MG tablet take 2 tablets by mouth once daily  . budesonide-formoterol (SYMBICORT) 160-4.5 MCG/ACT inhaler Inhale 2 puffs into the lungs 2 (two) times daily.  Marland Kitchen buPROPion (WELLBUTRIN XL) 150 MG 24 hr tablet take 1 tablet by mouth once daily  . chlorpheniramine (CHLOR-TRIMETON) 4 MG tablet 8mg  at night  . dofetilide (TIKOSYN) 250 MCG capsule Take 1 capsule (250 mcg total) by mouth 2 (two) times daily.  . fluticasone (FLONASE) 50 MCG/ACT nasal spray instill 1 spray into each  nostril twice a day  . insulin aspart (NOVOLOG FLEXPEN) 100 UNIT/ML FlexPen Inject 5 Units into the skin 2 (two) times daily. Inject 5U with any meal containing 2 servings of carbs.  . Insulin Pen Needle (B-D ULTRAFINE III SHORT PEN) 31G X 8 MM MISC Use as directed up to 4 times daily to check blood sugar  . LANTUS SOLOSTAR 100 UNIT/ML Solostar Pen inject 57 units subcutaneously at bedtime  . levalbuterol (XOPENEX HFA) 45 MCG/ACT inhaler INHALE 1 TO 2 PUFFS BY MOUTH INTO THE LUNGS EVERY 4 HOURS IF NEEDED FOR WHEEZING  . levalbuterol (XOPENEX) 0.63 MG/3ML nebulizer solution One vial in nebulizer four times daily as needed  . mometasone-formoterol (DULERA) 200-5 MCG/ACT AERO Inhale 2 puffs into the lungs 2 (two) times daily.  . montelukast (SINGULAIR) 10 MG tablet take 1 tablet by mouth at bedtime  . polyethylene glycol powder (GLYCOLAX/MIRALAX) powder take 17GM (DISSOLVED IN WATER) by mouth once daily THREE TIMES A WEEK if needed as directed  . potassium chloride SA (K-DUR,KLOR-CON) 20 MEQ tablet Take 1 tablet (20 mEq total) by mouth daily as directed.  . ranitidine (ZANTAC) 150 MG tablet Take 1 tablet (150 mg total) by mouth daily.  . rosuvastatin (CRESTOR) 10 MG tablet Take 1 tablet (10 mg total) by mouth daily.  Marland Kitchen torsemide (DEMADEX) 20 MG tablet Take 1 tablet (20 mg total) by mouth twice daily as directed.  . [DISCONTINUED] warfarin (COUMADIN)  5 MG tablet take 1 to 1 and 1/2 tablets by mouth daily as directed BY COUMADIN CLINIC  . predniSONE (DELTASONE) 20 MG tablet Take 2 tablets (40 mg total) by mouth daily with breakfast. For four days (Patient not taking: Reported on 03/01/2017)   No facility-administered encounter medications on file as of 04/08/2017.     Allergies (verified) Avelox [moxifloxacin hcl in nacl]; Simvastatin; Ace inhibitors; and Latex   History: Past Medical History:  Diagnosis Date  . AICD (automatic cardioverter/defibrillator) present    Medtronic  . Anemia   .  Anxiety   . Arthritis   . Asthma    has had multiple hospitalizations for this  . Atrial fibrillation (HCC)    ablation x 2 [facility], 01/2006, 2011.  on warfarin  . Cardiac arrest [facility]) jan 2012   in hospital for pneumonia when this occured- occured at the hospital  . CHF (congestive heart failure) (HCC) 2012   Echo 08/08/10 by [facility]. EF 35-45%. LV systolic function moderately reduced. Moderate global hypokinesis of LV.  RV systolic function moderately reduced. Mild MR. Trace AR.  Marland Kitchen Complication of anesthesia    difficult time waking up after anesthesia  . Depression   . Diabetes mellitus    Type 2  . Dysrhythmia    Atrial fibrillation  . GASTROESOPHAGEAL REFLUX, NO ESOPHAGITIS 06/10/2006   Qualifier: Diagnosis of  By: Abundio Miu    . GERD (gastroesophageal reflux disease)   . History of hiatal hernia   . Hyperlipidemia   . Hypertension   . Limb pain 06/04/2008   LLE, Baker's cyst in popliteal fossa, no DVT  . Non-ischemic cardiomyopathy (HCC)    echo 08/08/10 - EF 35-45% LV and RV systolic function mod reduced  . Primary localized osteoarthritis of left knee   . Primary localized osteoarthritis of right knee   . Primary localized osteoarthritis of right knee   . Sleep apnea    wears CPAP nightly  . Vocal cord disease   . Wears glasses    Past Surgical History:  Procedure Laterality Date  . BREAST EXCISIONAL BIOPSY Right 1999  . BREAST LUMPECTOMY Right   . CARDIAC DEFIBRILLATOR PLACEMENT  05/02/10   Medtronic Protecta XT-DR for CHF-VT, last download 04/12/12  . CHOLECYSTECTOMY    . COLONOSCOPY    . HAMMER TOE SURGERY Right   . KNEE ARTHROSCOPY Right   . PAF Ablation     By Dr Sampson Goon. Now sees Dr Steele Berg [facility]  . TOTAL KNEE ARTHROPLASTY Left 09/17/2014  . TOTAL KNEE ARTHROPLASTY Left 09/17/2014   Procedure: TOTAL KNEE ARTHROPLASTY;  Surgeon: Salvatore Marvel, MD;  [facility]: [facility];  Service: Orthopedics;  Laterality: Left;  pt has ICD  . TUBAL LIGATION       Family History  Problem Relation Age of Onset  . Diabetes Father   . Hypertension Father   . Diabetes Brother   . Hypertension Brother   . Lung disease Neg Hx   . Cancer Neg Hx   . Rheumatologic disease Neg Hx    Social History   Socioeconomic History  . Marital status: Widowed    Spouse name: None  . Number of children: None  . Years of education: None  . Highest education level: None  Social Needs  . Financial resource strain: None  . Food insecurity - worry: None  . Food insecurity - inability: None  . Transportation needs - medical: None  . Transportation needs - non-medical: None  Occupational History  . None  Tobacco Use  . Smoking status: Never Smoker  . Smokeless tobacco: Never Used  Substance and Sexual Activity  . Alcohol use: No  . Drug use: No  . Sexual activity: No  Other Topics Concern  . None  Social History Narrative   Not employed   Exercise- walking 1 hour daily.    Diet- eating healthy diet.       [facility]:   She is originally from [place]. Has always lived in [place]. 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 Currently has a small dog. No bird or mold exposure.       Current Social History 04/08/2017        Patient lives with daughter, Lafonda Mosses, in two level home 04/08/2017   Transportation: Patient has own vehicle and drives herself 04/08/2017   Important Relationships Daughter, Lafonda Mosses 04/08/2017    Pets: Shiatzu Evaristo Bury)  04/08/2017   Education / Work:  10 th grade/ None 04/08/2017   Interests / Fun: Read, do puzzles 04/08/2017   Current Stressors: None because she prays to God 04/08/2017   Religious / Personal Beliefs: Non-Denominational 04/08/2017   L. Ducatte, RN, BSN                                                                                                  Tobacco Counseling Patient has never smoked and has no plans to  start.  Clinical Intake:  Pre-visit preparation completed: Yes  Pain : 0-10 Pain Score: 4  Pain Type: Chronic pain Pain Location: Knee Pain Orientation: Right Pain Descriptors / Indicators: Sharp Pain Onset: (1988) Pain Frequency: Intermittent Pain Relieving Factors: Tylenol Effect of Pain on Daily Activities: No  Pain Relieving Factors: Tylenol  Nutritional Status: BMI > 30  Obese Diabetes: Yes CBG done?: No Did pt. bring in CBG monitor from home?: No   Interpreter Needed?: No   Activities of Daily Living In your present state of health, do you have any difficulty performing the following activities: 04/08/2017  Hearing? N  Vision? N  Difficulty concentrating or making decisions? N  Walking or climbing stairs? Y  Dressing or bathing? N  Doing errands, shopping? N  Preparing Food and eating ? N  Using the Toilet? N  In the past six months, have you accidently leaked urine? N  Do you have problems with loss of bowel control? N  Managing your Medications? N  Managing your Finances? N  Housekeeping or managing your Housekeeping? N  Some recent data might be hidden   Home Safety:  My home has a working smoke alarm:  Yes X 3           My home throw rugs have been fastened down to the floor or removed:  Removed I have a non-slip surface or non-slip mats in the bathtub and shower:  Mat        All my home's stairs have handrails, including any outdoor stairs  Two level home with handrails  inside and 1 outside step without handrail           My home's floors, stairs and hallways are free from clutter, wires and cords:  Yes     I have animals in my home  Yes, Shiatzu Evaristo Bury) I wear seatbelts consistently:  Yes    Immunizations and Health Maintenance Immunization History  Administered Date(s) Administered  . H1N1 03/23/2008  . Influenza Split 01/06/2011, 01/18/2012  . Influenza Whole 01/09/2008, 01/21/2009, 01/14/2010  . Influenza,inj,Quad PF,6+ Mos 12/21/2012,  01/04/2014, 01/14/2015, 12/06/2015, 12/04/2016  . Pneumococcal Conjugate-13 09/25/2013  . Pneumococcal Polysaccharide-23 01/11/2001, 02/22/2012, 04/08/2017  . Td 01/11/2001  . Tdap 02/22/2012   Health Maintenance Due  Topic Date Due  . OPHTHALMOLOGY EXAM  12/05/2016  Pneumovax administered today Patient had foot exam done at Dr. Theotis Burrow office on 02/23/2017. In Epic Had eye exam 2 weeks ago with Dr. Arlys John.   ROI signed to send for records.  Patient Care Team: Garth Bigness, MD as PCP - General (Family Medicine) Storm Frisk, MD as Attending Physician (Pulmonary Disease) Carrington Clamp, DPM as Consulting Physician (Podiatry) Lucille Passy, MD as Referring Physician (Ophthalmology) Croitoru, Rachelle Hora, MD as Consulting Physician (Cardiology)  Indicate any recent Medical Services you may have received from other than [facility] providers in the past year (date may be approximate).     Assessment:   This is a routine wellness examination for [facility].  Hearing/Vision screen  Hearing Screening   Method: Audiometry   125Hz  250Hz  500Hz  1000Hz  2000Hz  3000Hz  4000Hz  6000Hz  8000Hz   Right ear:   Fail Fail 40  Fail    Left ear:   40 40 40  40      Dietary issues and exercise activities discussed: Current Exercise Habits: Home exercise routine, Type of exercise: walking, Time (Minutes): 30(Walks 2 laps around mall daily), Frequency (Times/Week): 7, Weekly Exercise (Minutes/Week): 210, Intensity: Moderate, Exercise limited by: cardiac condition(s);orthopedic condition(s);respiratory conditions(s)  Goals    . HEMOGLOBIN A1C < 8 (pt-stated)      Depression Screen PHQ 2/9 Scores 04/08/2017 03/01/2017 12/04/2016 08/18/2016 04/21/2016 01/27/2016 01/20/2016  PHQ - 2 Score 0 0 0 0 0 0 0    Fall Risk Fall Risk  04/08/2017 03/01/2017 12/04/2016 08/18/2016 01/27/2016  Falls in the past year? No No No No No   TUG Test:  Done in 15 seconds. Patient used both hands to push out of chair and  to sit back down. Falls prevention discussed in detail and literature given.  Cognitive Function: Mini-Cog  Passed with score 3/5   Screening Tests Health Maintenance  Topic Date Due  . OPHTHALMOLOGY EXAM  12/05/2016  . URINE MICROALBUMIN  01/18/2021 (Originally 05/15/1960)  . HEMOGLOBIN A1C  08/29/2017  . FOOT EXAM  04/08/2018  . MAMMOGRAM  06/02/2018  . PAP SMEAR  09/02/2018  . COLONOSCOPY  09/16/2020  . TETANUS/TDAP  02/21/2022  . INFLUENZA VACCINE  Completed  . DEXA SCAN  Completed  . Hepatitis C Screening  Completed  . HIV Screening  Completed  . PNA vac Low Risk Adult  Completed    Qualifies for Shingles Vaccine? Yes  Cancer Screenings: Breast: Up to date on Mammogram? Yes   Up to date of Bone Density/Dexa? Yes       Plan:     I have personally reviewed and noted the following in the patient's chart:   . Medical and social history . Use of alcohol, tobacco or illicit drugs  . Current medications and  supplements . Functional ability and status . Physical activity . Advanced directives . List of other physicians . Hospitalizations, surgeries, and ER visits in previous 12 months . Vitals . Screenings to include cognitive, depression, and falls . Referrals and appointments  In addition, I have reviewed and discussed with patient certain preventive protocols, quality metrics, and best practice recommendations. A written personalized care plan for preventive services as well as general preventive health recommendations were provided to patient.    Ducatte, Nickola Major, RN  I have reviewed this visit and discussed with Alecia Lemming, RN, BSN, and agree with her documentation.  Loni Muse, MD

## 2017-04-28 ENCOUNTER — Telehealth: Payer: Self-pay | Admitting: Acute Care

## 2017-04-28 DIAGNOSIS — G4733 Obstructive sleep apnea (adult) (pediatric): Secondary | ICD-10-CM

## 2017-04-28 NOTE — Telephone Encounter (Signed)
DME is [facility]. °

## 2017-04-29 NOTE — Telephone Encounter (Signed)
Spoke with pt. She is needing an order sent to [facility] for new CPAP supplies. Order has been placed. Nothing further was needed.

## 2017-05-03 ENCOUNTER — Ambulatory Visit (INDEPENDENT_AMBULATORY_CARE_PROVIDER_SITE_OTHER): Payer: Medicare Other | Admitting: Pharmacist Clinician (PhC)/ Clinical Pharmacy Specialist

## 2017-05-03 DIAGNOSIS — Z7901 Long term (current) use of anticoagulants: Secondary | ICD-10-CM

## 2017-05-03 DIAGNOSIS — I4891 Unspecified atrial fibrillation: Secondary | ICD-10-CM

## 2017-05-03 LAB — POCT INR: INR: 1.8

## 2017-05-03 NOTE — Patient Instructions (Signed)
Description   Take 1.5 tablets today, January 21st. Increase dose to 1 tablets every day except 1.5 tablets on Sunday.  Recheck INR in 3 weeks.

## 2017-05-04 ENCOUNTER — Ambulatory Visit (INDEPENDENT_AMBULATORY_CARE_PROVIDER_SITE_OTHER): Payer: Medicare Other | Admitting: Adult Health

## 2017-05-04 ENCOUNTER — Encounter: Payer: Self-pay | Admitting: Adult Health

## 2017-05-04 DIAGNOSIS — E669 Obesity, unspecified: Secondary | ICD-10-CM | POA: Diagnosis not present

## 2017-05-04 DIAGNOSIS — Z9989 Dependence on other enabling machines and devices: Secondary | ICD-10-CM

## 2017-05-04 DIAGNOSIS — G4733 Obstructive sleep apnea (adult) (pediatric): Secondary | ICD-10-CM | POA: Diagnosis not present

## 2017-05-04 MED ORDER — LEVALBUTEROL HCL 0.63 MG/3ML IN NEBU: INHALATION_SOLUTION | RESPIRATORY_TRACT | 3 refills | Status: DC

## 2017-05-04 MED ORDER — MOMETASONE FURO-FORMOTEROL FUM 200-5 MCG/ACT IN AERO: 2.0000 | INHALATION_SPRAY | Freq: Two times a day (BID) | RESPIRATORY_TRACT | 5 refills | Status: DC

## 2017-05-04 NOTE — Assessment & Plan Note (Signed)
Wt loss  

## 2017-05-04 NOTE — Addendum Note (Signed)
Addended by: Boone Master E on: 05/04/2017 10:55 AM   Modules accepted: Orders

## 2017-05-04 NOTE — Assessment & Plan Note (Signed)
Compensated on CPAP  Plan  Patient Instructions  Continue on CPAP At bedtime   Keep up good work .  Do not drive if sleepy  Work on healthy weight .  Follow up with Dr. Vassie Loll  In 6 months and As needed

## 2017-05-04 NOTE — Patient Instructions (Signed)
Continue on CPAP At bedtime   Keep up good work .  Do not drive if sleepy  Work on healthy weight .  Follow up with Dr. Vassie Loll  In 6 months and As needed

## 2017-05-04 NOTE — Progress Notes (Signed)
@Patient  ID: [name], female    DOB: 1951-01-29, 67 y.o.   MRN: [id number]  Chief Complaint  Patient presents with  . Follow-up    OSA    Referring provider: Garth Bigness, MD  HPI: 67 year old female followed for moderate persistent asthma and obstructive sleep apnea  TEST   PFT 06/19/16: FVC 2.74 L (95%) FEV1 2.06 L (92%) FEV1/FVC 0.75 FEF 25-75 1.53 L (72%) negative bronchodilator response                                                                   DLCO corrected 58% 11/04/15: FVC 2.95 L (102%) FEV1 2.10 L (93%) FEV1/FVC 0.71 FEF 25-75 1.37 L (64%) negative bronchodilator response TLC 6.02 L (109%) RV 109% ERV 47% DLCO uncorrected 75% (hgb 12.7) 12/06/08: FVC 3.43 L (99%) FEV1 2.46 L (94%) FEV1/FVC 0.72 FEF 25-75 1.59 L (85%) negative bronchodilator response TLC 6.17 L (112%) RV 131% ERV 52% DLCO uncorrected 74%   05/04/2017 Follow up ; OSA  Patient presents for follow-up of sleep apnea.  Patient says she is doing very well on CPAP at bedtime.  She wears it every night does not miss any.  She feels rested with no significant daytime sleepiness.  Download shows excellent compliance with average usage at 8 hours.  She is on CPAP 8 cm H2O.  AHI is 1.5.  Minimal leaks.  We discussed weight loss..  Allergies  Allergen Reactions  . Avelox [Moxifloxacin Hcl In Nacl] Other (See Comments)    Cardiac arrest per pt  . Simvastatin Other (See Comments)    myalgias  . Ace Inhibitors Cough    Dry cough   . Latex Rash    Immunization History  Administered Date(s) Administered  . H1N1 03/23/2008  . Influenza Split 01/06/2011, 01/18/2012  . Influenza Whole 01/09/2008, 01/21/2009, 01/14/2010  . Influenza,inj,Quad PF,6+ Mos 12/21/2012, 01/04/2014, 01/14/2015, 12/06/2015, 12/04/2016  . Pneumococcal Conjugate-13 09/25/2013  . Pneumococcal Polysaccharide-23 01/11/2001, 02/22/2012, 04/08/2017  . Td 01/11/2001  . Tdap 02/22/2012    Past Medical History:  Diagnosis Date    . AICD (automatic cardioverter/defibrillator) present    Medtronic  . Anemia   . Anxiety   . Arthritis   . Asthma    has had multiple hospitalizations for this  . Atrial fibrillation (HCC)    ablation x 2 [facility], 01/2006, 2011.  on warfarin  . Cardiac arrest [facility]) jan 2012   in hospital for pneumonia when this occured- occured at the hospital  . CHF (congestive heart failure) (HCC) 2012   Echo 08/08/10 by [facility]. EF 35-45%. LV systolic function moderately reduced. Moderate global hypokinesis of LV.  RV systolic function moderately reduced. Mild MR. Trace AR.  Marland Kitchen Complication of anesthesia    difficult time waking up after anesthesia  . Depression   . Diabetes mellitus    Type 2  . Dysrhythmia    Atrial fibrillation  . GASTROESOPHAGEAL REFLUX, NO ESOPHAGITIS 06/10/2006   Qualifier: Diagnosis of  By: Abundio Miu    . GERD (gastroesophageal reflux disease)   . History of hiatal hernia   . Hyperlipidemia   . Hypertension   . [name] 06/04/2008   LLE, Baker's cyst in popliteal fossa, no DVT  .  Non-ischemic cardiomyopathy (HCC)    echo 08/08/10 - EF 35-45% LV and RV systolic function mod reduced  . Primary localized osteoarthritis of left knee   . Primary localized osteoarthritis of right knee   . Primary localized osteoarthritis of right knee   . Sleep apnea    wears CPAP nightly  . Vocal cord disease   . Wears glasses     Tobacco History: Social History   Tobacco Use  Smoking Status Never Smoker  Smokeless Tobacco Never Used   Counseling given: Not Answered   Outpatient Encounter Medications as of 05/04/2017  Medication Sig  . B-D ULTRAFINE III SHORT PEN 31G X 8 MM MISC use as directed  . bisoprolol (ZEBETA) 5 MG tablet take 2 tablets by mouth once daily  . budesonide-formoterol (SYMBICORT) 160-4.5 MCG/ACT inhaler Inhale 2 puffs into the lungs 2 (two) times daily.  Marland Kitchen buPROPion (WELLBUTRIN XL) 150 MG 24 hr tablet take 1 tablet by mouth once daily  .  chlorpheniramine (CHLOR-TRIMETON) 4 MG tablet 8mg  at night  . dofetilide (TIKOSYN) 250 MCG capsule Take 1 capsule (250 mcg total) by mouth 2 (two) times daily.  . fluticasone (FLONASE) 50 MCG/ACT nasal spray instill 1 spray into each nostril twice a day  . insulin aspart (NOVOLOG FLEXPEN) 100 UNIT/ML FlexPen Inject 5 Units into the skin 2 (two) times daily. Inject 5U with any meal containing 2 servings of carbs.  . Insulin Pen Needle (B-D ULTRAFINE III SHORT PEN) 31G X 8 MM MISC Use as directed up to 4 times daily to check blood sugar  . LANTUS SOLOSTAR 100 UNIT/ML Solostar Pen inject 57 units subcutaneously at bedtime  . levalbuterol (XOPENEX HFA) 45 MCG/ACT inhaler INHALE 1 TO 2 PUFFS BY MOUTH INTO THE LUNGS EVERY 4 HOURS IF NEEDED FOR WHEEZING  . levalbuterol (XOPENEX) 0.63 MG/3ML nebulizer solution One vial in nebulizer four times daily as needed  . mometasone-formoterol (DULERA) 200-5 MCG/ACT AERO Inhale 2 puffs into the lungs 2 (two) times daily.  . montelukast (SINGULAIR) 10 MG tablet take 1 tablet by mouth at bedtime  . polyethylene glycol powder (GLYCOLAX/MIRALAX) powder take 17GM (DISSOLVED IN WATER) by mouth once daily THREE TIMES A WEEK if needed as directed  . potassium chloride SA (K-DUR,KLOR-CON) 20 MEQ tablet Take 1 tablet (20 mEq total) by mouth daily as directed.  . ranitidine (ZANTAC) 150 MG tablet Take 1 tablet (150 mg total) by mouth daily.  . rosuvastatin (CRESTOR) 10 MG tablet Take 1 tablet (10 mg total) by mouth daily.  Marland Kitchen torsemide (DEMADEX) 20 MG tablet Take 1 tablet (20 mg total) by mouth twice daily as directed.  . warfarin (COUMADIN) 5 MG tablet take 1 to 1.5 tablets by mouth once daily as directed BY COUMADIN CLINIC  . predniSONE (DELTASONE) 20 MG tablet Take 2 tablets (40 mg total) by mouth daily with breakfast. For four days (Patient not taking: Reported on 03/01/2017)   No facility-administered encounter medications on file as of 05/04/2017.      Review of  Systems  Constitutional:   No  weight loss, night sweats,  Fevers, chills, fatigue, or  lassitude.  HEENT:   No headaches,  Difficulty swallowing,  Tooth/dental problems, or  Sore throat,                No sneezing, itching, ear ache, nasal congestion, post nasal drip,   CV:  No chest [name],  Orthopnea, PND, swelling in lower extremities, anasarca, dizziness, palpitations, syncope.   GI  No heartburn, indigestion, abdominal [name], nausea, vomiting, diarrhea, change in bowel habits, loss of appetite, bloody stools.   Resp: No shortness of breath with exertion or at rest.  No excess mucus, no productive cough,  No non-productive cough,  No coughing up of blood.  No change in color of mucus.  No wheezing.  No chest wall deformity  Skin: no rash or lesions.  GU: no dysuria, change in color of urine, no urgency or frequency.  No flank [name], no hematuria   MS:  No joint [name] or swelling.  No decreased range of motion.  No back [name].    Physical Exam  BP 140/88 (BP Location: Right Arm, Cuff Size: Normal)   Pulse 76   Ht 5\' 6"  (1.676 m)   Wt 211 lb 3.2 oz (95.8 kg)   LMP 07/26/2011   SpO2 98%   BMI 34.09 kg/m   GEN: A/Ox3; pleasant , NAD, obese    HEENT:  [place]/AT,  EACs-clear, TMs-wnl, NOSE-clear, THROAT-clear, no lesions, no postnasal drip or exudate noted. Class 2-3 MP airway   NECK:  Supple w/ fair ROM; no JVD; normal carotid impulses w/o bruits; no thyromegaly or nodules palpated; no lymphadenopathy.    RESP  Clear  P & A; w/o, wheezes/ rales/ or rhonchi. no accessory muscle use, no dullness to percussion  CARD:  RRR, no m/r/g, no peripheral edema, pulses intact, no cyanosis or clubbing.  GI:   Soft & nt; nml bowel sounds; no organomegaly or masses detected.   Musco: Warm bil, no deformities or joint swelling noted.   Neuro: alert, no focal deficits noted.    Skin: Warm, no lesions or rashes    Lab Results:  CBC  BNP  Imaging: No results found.   Assessment &  Plan:   OSA on CPAP Compensated on CPAP  Plan  Patient Instructions  Continue on CPAP At bedtime   Keep up good work .  Do not drive if sleepy  Work on healthy weight .  Follow up with Dr. Vassie Loll  In 6 months and As needed        Obesity (BMI 30-39.9) Wt loss      Rubye Oaks, NP 05/04/2017

## 2017-05-10 ENCOUNTER — Ambulatory Visit (INDEPENDENT_AMBULATORY_CARE_PROVIDER_SITE_OTHER): Payer: Medicare Other

## 2017-05-10 DIAGNOSIS — Z95810 Presence of automatic (implantable) cardiac defibrillator: Secondary | ICD-10-CM | POA: Diagnosis not present

## 2017-05-10 DIAGNOSIS — I5022 Chronic systolic (congestive) heart failure: Secondary | ICD-10-CM | POA: Diagnosis not present

## 2017-05-10 NOTE — Progress Notes (Signed)
EPIC Encounter for ICM Monitoring  Patient Name: [name] is a 68 y.o. female Date: 05/10/2017 Primary Care Physican: Garth Bigness, MD Primary Cardiologist:Croitoru Electrophysiologist: Croitoru Dry Weight: 210lbs        Heart Failure questions reviewed, pt asymptomatic.   Thoracic impedance normal.  Prescribed dosage: Torsemide 20 mg 1 tablet twice daily. Potassium 20 mEq 1 tablet daily  Recommendations: No changes.   Encouraged to call for fluid symptoms.  Follow-up plan: ICM clinic phone appointment on 07/08/2017.  Office appointment scheduled 06/09/2017 with Dr. Royann Shivers.  Copy of ICM check sent to Dr. Vickii Chafe.   3 month ICM trend: 05/10/2017    AT/AF   1 Year ICM trend:       [name], RN 05/10/2017 2:23 PM

## 2017-05-11 ENCOUNTER — Telehealth: Payer: Self-pay | Admitting: Adult Health

## 2017-05-11 NOTE — Telephone Encounter (Signed)
I'm sorry, I do not have the CMN for this patient The original message says something about nebs - has she been called about that? Thanks

## 2017-05-11 NOTE — Progress Notes (Signed)
Thanks MCr 

## 2017-05-11 NOTE — Telephone Encounter (Signed)
Jess, please advise on this for pt.  Thanks!

## 2017-05-12 MED ORDER — LEVALBUTEROL HCL 0.63 MG/3ML IN NEBU: INHALATION_SOLUTION | RESPIRATORY_TRACT | 5 refills | Status: DC

## 2017-05-12 NOTE — Telephone Encounter (Signed)
The CMN originally was sent to Dr. Jamison Neighbor and they had not seen anyone until she saw Tammy Parrett on 05/04/2017. I called [facility] and spoke with Misty Stanley and ask that they resend the CMN with Tammy Parrett's name on the form to sign

## 2017-05-12 NOTE — Telephone Encounter (Signed)
I now have the corrected CMN for [name] to sign and I have given it to [facility]

## 2017-05-12 NOTE — Telephone Encounter (Signed)
Called pt asking if she had received her cpap supplies or neb solution yet and she stated to me she had not.  I sent in Rx of the neb solution to pt's preferred pharmacy.  Synetta Fail, please advise if you have seen a form from [facility] regarding pt's cpap supplies.  Thanks!

## 2017-05-12 NOTE — Telephone Encounter (Signed)
This CMN has been signed by Rubye Oaks and faxed to [facility] with confirmation that the fax went through

## 2017-05-17 ENCOUNTER — Telehealth: Payer: Self-pay | Admitting: Adult Health

## 2017-05-17 NOTE — Telephone Encounter (Addendum)
Spoke with pt in the lobby. States that she is having a hard time getting her CPAP supplies from [facility]. She is also needing a prescription sent in for Xopenex nebulizer solution. I advised her that the form [facility] needed from [place] for her supplies was faxed to [facility] on 05/12/17 per Anita's documentation. Her prescription was also sent in on 05/12/17. She states that her pharmacy does not have this prescription. This will be resent to [facility]. Advised the pt that I would contact [facility] to work out the issue with her CPAP supplies.  I contacted Barbara Cower with [facility] and was told that they never received the CMN from [place] on the pt's supplies. Spoke with Synetta Fail to see if she still had this CMN. She states that she has already sent it out to be scanned but she put a copy in the [facility] folder for Melissa to pick up. I have called and spoke with Melissa and she is going to check to see if she still has this copy. Will await Melissa's call back.

## 2017-05-18 MED ORDER — LEVALBUTEROL HCL 0.63 MG/3ML IN NEBU: INHALATION_SOLUTION | RESPIRATORY_TRACT | 5 refills | Status: DC

## 2017-05-18 NOTE — Telephone Encounter (Signed)
Spoke to [facility], who states that CMN was received and [facility] should be contacting regarding cpap supplies.   Pt is aware and voiced her understanding.  Rx for Xopenex 0.63 has been resent, as [facility] did not received Rx sent on 05/12/17. Nothing further is needed.

## 2017-05-18 NOTE — Telephone Encounter (Signed)
Patient is calling for an update, CB is [phone]

## 2017-05-24 ENCOUNTER — Ambulatory Visit (INDEPENDENT_AMBULATORY_CARE_PROVIDER_SITE_OTHER): Payer: Medicare Other | Admitting: Sports Medicine

## 2017-05-24 ENCOUNTER — Encounter: Payer: Self-pay | Admitting: Sports Medicine

## 2017-05-24 DIAGNOSIS — L84 Corns and callosities: Secondary | ICD-10-CM

## 2017-05-24 DIAGNOSIS — I739 Peripheral vascular disease, unspecified: Secondary | ICD-10-CM

## 2017-05-24 DIAGNOSIS — D689 Coagulation defect, unspecified: Secondary | ICD-10-CM

## 2017-05-24 DIAGNOSIS — B351 Tinea unguium: Secondary | ICD-10-CM

## 2017-05-24 DIAGNOSIS — E1140 Type 2 diabetes mellitus with diabetic neuropathy, unspecified: Secondary | ICD-10-CM

## 2017-05-24 DIAGNOSIS — Z794 Long term (current) use of insulin: Secondary | ICD-10-CM

## 2017-05-24 DIAGNOSIS — M79609 Pain in unspecified limb: Secondary | ICD-10-CM | POA: Diagnosis not present

## 2017-05-24 NOTE — Progress Notes (Signed)
Subjective: ALIZAYAH NEDLEY is a 67 y.o. female patient with history of diabetes who presents to office today complaining of long,mildly painful nails  while ambulating in shoes; unable to trim. Patient states that the glucose reading this morning was 119 mg/dl. Patient denies any new changes in medication or new problems. Patient denies any new cramping, numbness, burning or tingling in the legs. Last A1c was around 8 and last INR around 1. Will follow up with PCP tomorrow and have INR checked this week.  Patient Active Problem List   Diagnosis Date Noted  . Arthritis of carpometacarpal (CMC) joint of thumb 03/26/2016  . Acute idiopathic gout of right wrist 03/26/2016  . Primary localized osteoarthritis of right knee   . Visit for monitoring Tikosyn therapy 08/21/2015  . Long term current use of anticoagulant therapy 08/21/2015  . Preoperative cardiovascular examination 08/21/2015  . Ventricular tachycardia (HCC) 05/22/2015  . Obesity (BMI 30-39.9) 01/04/2014  . ICD (St. Jude Protecta dual-chamber),secondary prevention (VF arrest) January 2012 11/19/2012  . CKD (chronic kidney disease) stage 2, GFR 60-89 ml/min 02/22/2012  . Asthma, moderate persistent 12/10/2011  . Health maintenance examination 11/02/2011  . Diabetic neuropathy (HCC) 09/18/2011  . Congestive heart failure (HCC) 02/18/2011  . Chronic systolic heart failure (HCC) 05/09/2010  . OSTEOARTHRITIS, KNEE 08/28/2008  . DISEASE, VOCAL CORD NEC 10/13/2006  . Type 2 diabetes, uncontrolled, with neuropathy (HCC) 06/10/2006  . HYPERCHOLESTEROLEMIA 06/10/2006  . Essential hypertension 06/10/2006  . Atrial fibrillation (HCC) 06/10/2006  . OSA on CPAP 06/10/2006   Current Outpatient Medications on File Prior to Visit  Medication Sig Dispense Refill  . B-D ULTRAFINE III SHORT PEN 31G X 8 MM MISC use as directed 100 each 9  . bisoprolol (ZEBETA) 5 MG tablet take 2 tablets by mouth once daily 60 tablet 10  . budesonide-formoterol  (SYMBICORT) 160-4.5 MCG/ACT inhaler Inhale 2 puffs into the lungs 2 (two) times daily. 1 Inhaler 6  . buPROPion (WELLBUTRIN XL) 150 MG 24 hr tablet take 1 tablet by mouth once daily 90 tablet 3  . chlorpheniramine (CHLOR-TRIMETON) 4 MG tablet 8mg  at night 14 tablet 0  . dofetilide (TIKOSYN) 250 MCG capsule Take 1 capsule (250 mcg total) by mouth 2 (two) times daily. 60 capsule 5  . fluticasone (FLONASE) 50 MCG/ACT nasal spray instill 1 spray into each nostril twice a day 16 g 5  . insulin aspart (NOVOLOG FLEXPEN) 100 UNIT/ML FlexPen Inject 5 Units into the skin 2 (two) times daily. Inject 5U with any meal containing 2 servings of carbs. 15 mL   . Insulin Pen Needle (B-D ULTRAFINE III SHORT PEN) 31G X 8 MM MISC Use as directed up to 4 times daily to check blood sugar 100 each 9  . LANTUS SOLOSTAR 100 UNIT/ML Solostar Pen inject 57 units subcutaneously at bedtime 15 mL 12  . levalbuterol (XOPENEX HFA) 45 MCG/ACT inhaler INHALE 1 TO 2 PUFFS BY MOUTH INTO THE LUNGS EVERY 4 HOURS IF NEEDED FOR WHEEZING 15 Inhaler 5  . levalbuterol (XOPENEX) 0.63 MG/3ML nebulizer solution One vial in nebulizer four times daily as needed 360 mL 5  . mometasone-formoterol (DULERA) 200-5 MCG/ACT AERO Inhale 2 puffs into the lungs 2 (two) times daily. 1 Inhaler 5  . montelukast (SINGULAIR) 10 MG tablet take 1 tablet by mouth at bedtime 30 tablet 6  . polyethylene glycol powder (GLYCOLAX/MIRALAX) powder take 17GM (DISSOLVED IN WATER) by mouth once daily THREE TIMES A WEEK if needed as directed 500 g 3  .  potassium chloride SA (K-DUR,KLOR-CON) 20 MEQ tablet Take 1 tablet (20 mEq total) by mouth daily as directed. 30 tablet 11  . predniSONE (DELTASONE) 20 MG tablet Take 2 tablets (40 mg total) by mouth daily with breakfast. For four days (Patient not taking: Reported on 03/01/2017) 8 tablet 0  . ranitidine (ZANTAC) 150 MG tablet Take 1 tablet (150 mg total) by mouth daily. 30 tablet 6  . rosuvastatin (CRESTOR) 10 MG tablet Take 1  tablet (10 mg total) by mouth daily. 90 tablet 3  . torsemide (DEMADEX) 20 MG tablet Take 1 tablet (20 mg total) by mouth twice daily as directed. 60 tablet 10  . warfarin (COUMADIN) 5 MG tablet take 1 to 1.5 tablets by mouth once daily as directed BY COUMADIN CLINIC 45 tablet 3   No current facility-administered medications on file prior to visit.    Allergies  Allergen Reactions  . Avelox [Moxifloxacin Hcl In Nacl] Other (See Comments)    Cardiac arrest per pt  . Simvastatin Other (See Comments)    myalgias  . Ace Inhibitors Cough    Dry cough   . Latex Rash    Recent Results (from the past 2160 hour(s))  HgB A1c     Status: Abnormal   Collection Time: 03/01/17  9:40 AM  Result Value Ref Range   Hemoglobin A1C 8.7   POCT INR     Status: None   Collection Time: 03/30/17  9:35 AM  Result Value Ref Range   INR 2.0   CUP PACEART REMOTE DEVICE CHECK     Status: None   Collection Time: 04/08/17  7:28 AM  Result Value Ref Range   Date Time Interrogation Session [id number]    Pulse Generator Manufacturer MERM    Pulse Gen Model [id number] Protecta XT DR    Pulse Gen Serial Number [id number]    Clinic Name [facility]    Implantable Pulse Generator Type Implantable Cardiac Defibulator    Implantable Pulse Generator Implant Date [id number]    Implantable Lead Manufacturer [facility]    Implantable Lead Model 5076 CapSureFix Novus    Implantable Lead Serial Number [id number]    Implantable Lead Implant Date [id number]    Implantable Lead Location [id number]    Implantable Lead Manufacturer [facility]    Implantable Lead Model 6935 Sprint Quattro Secure S    Implantable Lead Serial Number [id number]    Implantable Lead Implant Date [id number]    Implantable Lead Location [id number]    Lead Channel Setting Sensing Sensitivity 0.3 mV   Lead Channel Setting Pacing Amplitude 2 V   Lead Channel Setting Pacing Pulse Width 0.4 ms   Lead Channel Setting Pacing Amplitude 2.5 V   Lead Channel Impedance Value  418 ohm   Lead Channel Sensing Intrinsic Amplitude 1.5 mV   Lead Channel Sensing Intrinsic Amplitude 1.5 mV   Lead Channel Pacing Threshold Amplitude 0.5 V   Lead Channel Pacing Threshold Pulse Width 0.4 ms   Lead Channel Impedance Value 418 ohm   Lead Channel Sensing Intrinsic Amplitude 13.25 mV   Lead Channel Sensing Intrinsic Amplitude 13.25 mV   Lead Channel Pacing Threshold Amplitude 0.625 V   Lead Channel Pacing Threshold Pulse Width 0.4 ms   HighPow Impedance 342 ohm   HighPow Impedance 71 ohm   Battery Status OK    Battery Voltage 2.62 V   Brady Statistic RA Percent Paced 77.23 %   Brady Statistic RV Percent Paced 18.34 %   Kellogg AP  VP Percent 18.98 %   Brady Statistic AS VP Percent 0.74 %   Brady Statistic AP VS Percent 64.36 %   Brady Statistic AS VS Percent 15.92 %   Eval Rhythm ApVp   POCT INR     Status: None   Collection Time: 05/03/17  9:05 AM  Result Value Ref Range   INR 1.8     Objective: General: Patient is awake, alert, and oriented x 3 and in no acute distress.  Integument: Skin is warm, dry and supple bilateral. Nails are tender, long, thickened and  dystrophic with subungual debris, consistent with onychomycosis, 1-5 bilateral. No signs of infection. + callus hallux, sub 3-4, and 5th toes bilateral. Remaining integument unremarkable.  Vasculature:  Dorsalis Pedis pulse 1/4 bilateral. Posterior Tibial pulse  1/4 bilateral.  Capillary fill time <3 sec 1-5 bilateral. Scant hair growth to the level of the digits. Temperature gradient within normal limits. No varicosities present bilateral. No edema present bilateral.   Neurology: The patient has intact sensation measured with a 5.07/10g Semmes Weinstein Monofilament at all pedal sites bilateral . Vibratory sensation diminished bilateral with tuning fork. No Babinski sign present bilateral.   Musculoskeletal:  Asymptomatic pes planus, bunion and hammertoe pedal deformities noted bilateral. Muscular  strength 5/5 in all lower extremity muscular groups bilateral without pain on range of motion . No tenderness with calf compression bilateral.  Assessment and Plan: Problem List Items Addressed This Visit    None    Visit Diagnoses    Pain due to onychomycosis of nail    -  Primary   Corns and callosities       Type 2 diabetes mellitus with diabetic neuropathy, with long-term current use of insulin (HCC)       Peripheral arterial disease (HCC)       Blood clotting disorder (HCC)          -Examined patient. -Discussed and educated patient on diabetic foot care, especially with  regards to the vascular, neurological and musculoskeletal systems.  -Stressed the importance of good glycemic control and the detriment of not  controlling glucose levels in relation to the foot. -Mechanically debrided callus >5 using sterile chisel blade and debrided all nails 1-5 bilateral using sterile nail nipper and filed with dremel without incident  -For Diabetic shoes and insoles, office to contact primary care for approval / certification;  Office to arrange shoe fitting and dispensing. -Answered all patient questions -Patient to return  in 3 months for at risk foot care -Patient advised to call the office if any problems or questions arise in the meantime.  Asencion Islam, DPM

## 2017-05-25 ENCOUNTER — Ambulatory Visit: Payer: Medicare Other

## 2017-05-25 ENCOUNTER — Ambulatory Visit: Payer: Medicare Other | Admitting: Podiatry

## 2017-05-25 ENCOUNTER — Ambulatory Visit (INDEPENDENT_AMBULATORY_CARE_PROVIDER_SITE_OTHER): Payer: Medicare Other | Admitting: Pharmacist

## 2017-05-25 DIAGNOSIS — I4891 Unspecified atrial fibrillation: Secondary | ICD-10-CM | POA: Diagnosis not present

## 2017-05-25 DIAGNOSIS — Z7901 Long term (current) use of anticoagulants: Secondary | ICD-10-CM

## 2017-05-25 LAB — POCT INR: INR: 2.6

## 2017-05-31 LAB — CUP PACEART INCLINIC DEVICE CHECK
Brady Statistic AP VS Percent: 83.76 %
Brady Statistic RA Percent Paced: 89.72 %
Brady Statistic RV Percent Paced: 9.18 %
HIGH POWER IMPEDANCE MEASURED VALUE: 361 Ohm
HighPow Impedance: 68 Ohm
Implantable Lead Implant Date: 20120120
Implantable Lead Location: 753860
Implantable Lead Model: 6935
Implantable Pulse Generator Implant Date: 20120120
Lead Channel Impedance Value: 418 Ohm
Lead Channel Pacing Threshold Amplitude: 0.375 V
Lead Channel Pacing Threshold Amplitude: 0.5 V
Lead Channel Pacing Threshold Pulse Width: 0.4 ms
Lead Channel Sensing Intrinsic Amplitude: 1.375 mV
Lead Channel Sensing Intrinsic Amplitude: 1.375 mV
MDC IDC LEAD IMPLANT DT: 20120120
MDC IDC LEAD LOCATION: 753859
MDC IDC MSMT BATTERY VOLTAGE: 2.64 V
MDC IDC MSMT LEADCHNL RA IMPEDANCE VALUE: 399 Ohm
MDC IDC MSMT LEADCHNL RV PACING THRESHOLD PULSEWIDTH: 0.4 ms
MDC IDC MSMT LEADCHNL RV SENSING INTR AMPL: 12.625 mV
MDC IDC MSMT LEADCHNL RV SENSING INTR AMPL: 12.625 mV
MDC IDC SESS DTM: 20180813122920
MDC IDC SET LEADCHNL RA PACING AMPLITUDE: 2 V
MDC IDC SET LEADCHNL RV PACING AMPLITUDE: 2.5 V
MDC IDC SET LEADCHNL RV PACING PULSEWIDTH: 0.4 ms
MDC IDC SET LEADCHNL RV SENSING SENSITIVITY: 0.3 mV
MDC IDC STAT BRADY AP VP PERCENT: 9.18 %
MDC IDC STAT BRADY AS VP PERCENT: 0.18 %
MDC IDC STAT BRADY AS VS PERCENT: 6.88 %

## 2017-06-03 ENCOUNTER — Ambulatory Visit (INDEPENDENT_AMBULATORY_CARE_PROVIDER_SITE_OTHER): Payer: Medicare Other | Admitting: Family Medicine

## 2017-06-03 ENCOUNTER — Encounter: Payer: Self-pay | Admitting: Family Medicine

## 2017-06-03 VITALS — BP 124/82 | HR 81 | Temp 98.1°F | Ht 66.0 in | Wt 212.0 lb

## 2017-06-03 DIAGNOSIS — E1165 Type 2 diabetes mellitus with hyperglycemia: Secondary | ICD-10-CM

## 2017-06-03 DIAGNOSIS — E1140 Type 2 diabetes mellitus with diabetic neuropathy, unspecified: Secondary | ICD-10-CM

## 2017-06-03 DIAGNOSIS — IMO0002 Reserved for concepts with insufficient information to code with codable children: Secondary | ICD-10-CM

## 2017-06-03 LAB — POCT GLYCOSYLATED HEMOGLOBIN (HGB A1C): Hemoglobin A1C: 9.4

## 2017-06-03 NOTE — Progress Notes (Addendum)
   CC: DM check  HPI DM - CBGs at home = 109-120s preprandial. Before dinner - 160s.  Oatmeal for breakfast. No sugary drinks. No alcohol. Occasional lunch. Last night burrito and bean and cheese and chicken. Sweet potato fries. Nabs later. Orange juice.   Eye exam - states she is followed by retinal surgeon. Has upcoming appt.   ROS: denies CP, SOB, abd pain, dysuria, and changes in BM.    CC, SH/smoking status, and VS noted  Objective: BP 124/82   Pulse 81   Temp 98.1 F (36.7 C) (Oral)   Ht 5\' 6"  (1.676 m)   Wt 96.2 kg (212 lb)   LMP 07/26/2011   SpO2 97%   BMI 34.22 kg/m  Gen: NAD, alert, cooperative, and pleasant. HEENT: NCAT, EOMI, PERRL CV: RRR, no murmur Resp: CTAB, no wheezes, non-labored Ext: No edema, warm Neuro: Alert and oriented, Speech clear, No gross deficits  Assessment and plan:  Type 2 diabetes, uncontrolled, with neuropathy (HCC) After long discussion, patient with limited knowledge of DM and how bolus insulin works. She has not been consistently using novolog 5U with meals. Doesn't frequently eat lunch. A1c worsened today, likely 2/2 elevated post prandial sugars based on home CBGs report. Continue lantus dose as am sugars well controlled. Increased dinnertime novolog to 7U. Keep 5U novolog with breakfast. Given DM diet education again.    Orders Placed This Encounter  Procedures  . HgB A1c    Health Maintenance reviewed - asked patient to fax recent eye exam.  Loni Muse, MD, PGY2 06/03/2017 1:56 PM

## 2017-06-03 NOTE — Patient Instructions (Addendum)
It was a pleasure to see you today! Thank you for choosing [facility] for your primary care. [name] was seen for diabetes.   Our plans for today were:  Our fax number is [phone]. Please ask your eye doctor to fax over your records after your visit.   For your diabetes, please make sure you are using your novolog 5 units with breakfast, and then use novolog 5 units with dinner if your blood sugar is 100-150. Use 7 units if your blood sugar is 150-200 before dinner.   Keep your Lantus dose at 57 units.    Diet Recommendations for Diabetes   Starchy (carb) foods: Bread, rice, pasta, potatoes, corn, cereal, grits, crackers, bagels, muffins, all baked goods.  (Fruits, milk, and yogurt also have carbohydrate, but most of these foods will not spike your blood sugar as most starchy foods will.)  A few fruits do cause high blood sugars; use small portions of bananas (limit to 1/2 at a time), grapes, watermelon, oranges, and most tropical fruits.    Protein foods: Meat, fish, poultry, eggs, dairy foods, and beans such as pinto and kidney beans (beans also provide carbohydrate).   1. Eat at least 3 meals and 1-2 snacks per day. Never go more than 4-5 hours while awake without eating. Eat breakfast within the first hour of getting up.   2. Limit starchy foods to TWO per meal and ONE per snack. ONE portion of a starchy  food is equal to the following:   - ONE slice of bread (or its equivalent, such as half of a hamburger bun).   - 1/2 cup of a "scoopable" starchy food such as potatoes or rice.   - 15 grams of Total Carbohydrate as shown on food label.  3. Include at every meal: a protein food, a carb food, and vegetables and/or fruit.   - Obtain twice the volume of veg's as protein or carbohydrate foods for both lunch and dinner.   - Fresh or frozen veg's are best.   - Keep frozen veg's on hand for a quick vegetable serving.       You should return to our clinic to see Dr.  Chanetta Marshall in 3 months for diabetes.   Best,  Dr. Chanetta Marshall

## 2017-06-03 NOTE — Assessment & Plan Note (Signed)
After long discussion, patient with limited knowledge of DM and how bolus insulin works. She has not been consistently using novolog 5U with meals. Doesn't frequently eat lunch. A1c worsened today, likely 2/2 elevated post prandial sugars based on home CBGs report. Continue lantus dose as am sugars well controlled. Increased dinnertime novolog to 7U. Keep 5U novolog with breakfast. Given DM diet education again.

## 2017-06-04 ENCOUNTER — Telehealth: Payer: Self-pay | Admitting: Adult Health

## 2017-06-04 MED ORDER — LEVALBUTEROL HCL 0.63 MG/3ML IN NEBU: INHALATION_SOLUTION | RESPIRATORY_TRACT | 5 refills | Status: DC

## 2017-06-04 NOTE — Telephone Encounter (Signed)
Spoke with [place] at [facility] [employer]. She stated that they did receive a refill on the neb solution but since [facility] is paying for the solution, a form has to be filled out before patient can receive medication.   Gave Jasmine the fax to triage so I could get the form. Will fill out form and have TP sign it so the patient can receive her medication.

## 2017-06-04 NOTE — Telephone Encounter (Signed)
Spoke with patient. She was calling for a refill on her Xopenex neb solution. Advised patient that I would resend the RX again.   Attempted to call [facility] on [address] to verify if they have received the RX but no one answered. Will attempt to call back since the patient stated she is running low on her supplies.

## 2017-06-04 NOTE — Telephone Encounter (Signed)
Form has been faxed. Will place form in scan folder.   Nothing else needed.

## 2017-06-04 NOTE — Telephone Encounter (Signed)
Form has been filled out and given to Jess so that TP can sign form.

## 2017-06-06 ENCOUNTER — Other Ambulatory Visit: Payer: Self-pay | Admitting: Cardiovascular Disease

## 2017-06-07 NOTE — Telephone Encounter (Signed)
REFILL 

## 2017-06-09 ENCOUNTER — Encounter: Payer: Self-pay | Admitting: Cardiovascular Disease

## 2017-06-09 ENCOUNTER — Ambulatory Visit (INDEPENDENT_AMBULATORY_CARE_PROVIDER_SITE_OTHER): Payer: Medicare Other | Admitting: Pharmacist

## 2017-06-09 ENCOUNTER — Ambulatory Visit (INDEPENDENT_AMBULATORY_CARE_PROVIDER_SITE_OTHER): Payer: Medicare Other | Admitting: Cardiovascular Disease

## 2017-06-09 VITALS — BP 128/74 | HR 67 | Ht 66.0 in | Wt 212.4 lb

## 2017-06-09 DIAGNOSIS — I4891 Unspecified atrial fibrillation: Secondary | ICD-10-CM

## 2017-06-09 DIAGNOSIS — E669 Obesity, unspecified: Secondary | ICD-10-CM

## 2017-06-09 DIAGNOSIS — I10 Essential (primary) hypertension: Secondary | ICD-10-CM

## 2017-06-09 DIAGNOSIS — G4733 Obstructive sleep apnea (adult) (pediatric): Secondary | ICD-10-CM

## 2017-06-09 DIAGNOSIS — E1169 Type 2 diabetes mellitus with other specified complication: Secondary | ICD-10-CM

## 2017-06-09 DIAGNOSIS — I5042 Chronic combined systolic (congestive) and diastolic (congestive) heart failure: Secondary | ICD-10-CM

## 2017-06-09 DIAGNOSIS — Z79899 Other long term (current) drug therapy: Secondary | ICD-10-CM

## 2017-06-09 DIAGNOSIS — Z5181 Encounter for therapeutic drug level monitoring: Secondary | ICD-10-CM

## 2017-06-09 DIAGNOSIS — J4520 Mild intermittent asthma, uncomplicated: Secondary | ICD-10-CM

## 2017-06-09 DIAGNOSIS — Z7901 Long term (current) use of anticoagulants: Secondary | ICD-10-CM

## 2017-06-09 DIAGNOSIS — I472 Ventricular tachycardia, unspecified: Secondary | ICD-10-CM

## 2017-06-09 DIAGNOSIS — E7800 Pure hypercholesterolemia, unspecified: Secondary | ICD-10-CM | POA: Diagnosis not present

## 2017-06-09 DIAGNOSIS — Z95810 Presence of automatic (implantable) cardiac defibrillator: Secondary | ICD-10-CM | POA: Diagnosis not present

## 2017-06-09 DIAGNOSIS — N183 Chronic kidney disease, stage 3 unspecified: Secondary | ICD-10-CM

## 2017-06-09 DIAGNOSIS — I480 Paroxysmal atrial fibrillation: Secondary | ICD-10-CM | POA: Diagnosis not present

## 2017-06-09 LAB — POCT INR: INR: 2.5

## 2017-06-09 NOTE — Patient Instructions (Signed)
Dr Royann Shivers recommends that you continue on your current medications as directed. Please refer to the Current Medication list given to you today.  Remote monitoring is used to monitor your Pacemaker of ICD from home. This monitoring reduces the number of office visits required to check your device to one time per year. It allows [place] to keep an eye on the functioning of your device to ensure it is working properly. You are scheduled for a device check from home on Thursday, March 28th, 2019. You may send your transmission at any time that day. If you have a wireless device, the transmission will be sent automatically. After your physician reviews your transmission, you will receive a postcard with your next transmission date.  Dr Royann Shivers recommends that you schedule a follow-up appointment in 6 months with an ICD check. You will receive a reminder letter in the mail two months in advance. If you don't receive a letter, please call our office to schedule the follow-up appointment.  If you need a refill on your cardiac medications before your next appointment, please call your pharmacy.

## 2017-06-09 NOTE — Progress Notes (Signed)
Patient ID: [name], female   DOB: 1950/07/20, 67 y.o.   MRN: [id number] Patient ID: [name], female   DOB: April 08, 1951, 67 y.o.   MRN: [id number]    Cardiology Office Note    Date:  06/10/2017   ID:  [name], DOB 1950-04-19, MRN [id number]  PCP:  Garth Bigness, MD  Cardiologist:   Thurmon Fair, MD   Chief Complaint  Patient presents with  . Follow-up    pt reports no complaints    History of Present Illness:  [name] is a 67 y.o. female with a long-standing history of nonischemic cardiomyopathy, chronic systolic heart failure and paroxysmal atrial fibrillation.  She is feeling well.  She has not had any recent palpitations.  She has not had falls or bleeding complications.  She denies chest pain at rest or with activity.  She denies any problems with exertional dyspnea and has not suffered with PND, orthopnea or lower extremity edema.  She does not take diuretics.  Her defibrillator shows an overall burden of atrial fibrillation of 6.8%, consistently with good ventricular rate control.  Most of the episodes are very brief.  She is only had one very brief episode of nonsustained ventricular tachycardia.  There is no sustained arrhythmia.  She has 91% atrial pacing and only 11.5% ventricular pacing.  The heart rate histogram distribution is appropriate.  Her thoracic impedance has been steady without any evidence of fluid overload since her last download.  Activity level is steady at around 3 hours a day.  Her Medtronic Protecta defibrillator was implanted in 2012 is very close to reaching RRT. Battery voltage is 2.62 V, under RRT of 2.63 V, but has not officially declared need for replacement.   She had antitachycardia pacing for ventricular tachycardia in December 2016. Since then a handful of episodes of nonsustained VT have been recorded, none meeting criteria for treatment. She is compliant with CPAP for obstructive sleep apnea.  She has a history of moderate  nonischemic cardiomyopathy (no coronary disease by cardiac catheterization January 2012), recurrent paroxysmal atrial fibrillation status post 2 ablation procedures (Dr. Orson Aloe), with history of ventricular fibrillation arrest while on treatment with diltiazem and dofetilide and a prolonged QT interval, probably related to simultaneous quinolone therapy. She has a dual-chamber Medtronic Protecta defibrillator implanted in January 2012. In December 2016 her defibrillator recorded an episode of sustained monomorphic ventricular tachycardia. Antitachycardia pacing was unsuccessful at converting the arrhythmia, but she had a "dirty break" before defibrillator shock was delivered. The shock was aborted. Polymorphic VT lasting 2 seconds was recorded in the fall of 2017 during asthma exacerbation, but did not require intervention..  Most recent echo Sept 2017 showed improved LVEF 50%. At LV angiography in 2012, EF was 15-20%.  Past Medical History:  Diagnosis Date  . AICD (automatic cardioverter/defibrillator) present    Medtronic  . Anemia   . Anxiety   . Arthritis   . Asthma    has had multiple hospitalizations for this  . Atrial fibrillation (HCC)    ablation x 2 [facility], 01/2006, 2011.  on warfarin  . Cardiac arrest [facility]) jan 2012   in hospital for pneumonia when this occured- occured at the hospital  . CHF (congestive heart failure) (HCC) 2012   Echo 08/08/10 by [facility]. EF 35-45%. LV systolic function moderately reduced. Moderate global hypokinesis of LV.  RV systolic function moderately reduced. Mild MR. Trace AR.  Marland Kitchen Complication of anesthesia  difficult time waking up after anesthesia  . Depression   . Diabetes mellitus    Type 2  . Dysrhythmia    Atrial fibrillation  . GASTROESOPHAGEAL REFLUX, NO ESOPHAGITIS 06/10/2006   Qualifier: Diagnosis of  By: Abundio Miu    . GERD (gastroesophageal reflux disease)   . History of hiatal hernia   . Hyperlipidemia   .  Hypertension   . Limb pain 06/04/2008   LLE, Baker's cyst in popliteal fossa, no DVT  . Non-ischemic cardiomyopathy (HCC)    echo 08/08/10 - EF 35-45% LV and RV systolic function mod reduced  . Primary localized osteoarthritis of left knee   . Primary localized osteoarthritis of right knee   . Primary localized osteoarthritis of right knee   . Sleep apnea    wears CPAP nightly  . Vocal cord disease   . Wears glasses     Past Surgical History:  Procedure Laterality Date  . BREAST EXCISIONAL BIOPSY Right 1999  . BREAST LUMPECTOMY Right   . CARDIAC DEFIBRILLATOR PLACEMENT  05/02/10   Medtronic Protecta XT-DR for CHF-VT, last download 04/12/12  . CHOLECYSTECTOMY    . COLONOSCOPY    . HAMMER TOE SURGERY Right   . KNEE ARTHROSCOPY Right   . PAF Ablation     By Dr Sampson Goon. Now sees Dr Steele Berg at [facility]  . TOTAL KNEE ARTHROPLASTY Left 09/17/2014  . TOTAL KNEE ARTHROPLASTY Left 09/17/2014   Procedure: TOTAL KNEE ARTHROPLASTY;  Surgeon: Salvatore Marvel, MD;  Location: [facility] OR;  Service: Orthopedics;  Laterality: Left;  pt has ICD  . TUBAL LIGATION      Outpatient Medications Prior to Visit  Medication Sig Dispense Refill  . B-D ULTRAFINE III SHORT PEN 31G X 8 MM MISC use as directed 100 each 9  . bisoprolol (ZEBETA) 5 MG tablet take 2 tablets by mouth once daily 60 tablet 10  . budesonide-formoterol (SYMBICORT) 160-4.5 MCG/ACT inhaler Inhale 2 puffs into the lungs 2 (two) times daily. 1 Inhaler 6  . buPROPion (WELLBUTRIN XL) 150 MG 24 hr tablet take 1 tablet by mouth once daily 90 tablet 3  . chlorpheniramine (CHLOR-TRIMETON) 4 MG tablet 8mg  at night 14 tablet 0  . dofetilide (TIKOSYN) 250 MCG capsule Take 1 capsule (250 mcg total) by mouth 2 (two) times daily. 60 capsule 5  . fluticasone (FLONASE) 50 MCG/ACT nasal spray instill 1 spray into each nostril twice a day 16 g 5  . insulin aspart (NOVOLOG FLEXPEN) 100 UNIT/ML FlexPen Inject 5 Units into the skin 2 (two) times daily. Inject 5U with  any meal containing 2 servings of carbs. 15 mL   . Insulin Pen Needle (B-D ULTRAFINE III SHORT PEN) 31G X 8 MM MISC Use as directed up to 4 times daily to check blood sugar 100 each 9  . LANTUS SOLOSTAR 100 UNIT/ML Solostar Pen inject 57 units subcutaneously at bedtime 15 mL 12  . levalbuterol (XOPENEX HFA) 45 MCG/ACT inhaler INHALE 1 TO 2 PUFFS BY MOUTH INTO THE LUNGS EVERY 4 HOURS IF NEEDED FOR WHEEZING 15 Inhaler 5  . levalbuterol (XOPENEX) 0.63 MG/3ML nebulizer solution One vial in nebulizer four times daily as needed 360 mL 5  . mometasone-formoterol (DULERA) 200-5 MCG/ACT AERO Inhale 2 puffs into the lungs 2 (two) times daily. 1 Inhaler 5  . montelukast (SINGULAIR) 10 MG tablet take 1 tablet by mouth at bedtime 30 tablet 6  . polyethylene glycol powder (GLYCOLAX/MIRALAX) powder take 17GM (DISSOLVED IN WATER) by mouth once daily  THREE TIMES A WEEK if needed as directed 500 g 3  . potassium chloride SA (K-DUR,KLOR-CON) 20 MEQ tablet Take 1 tablet (20 mEq total) by mouth daily as directed. 30 tablet 11  . predniSONE (DELTASONE) 20 MG tablet Take 2 tablets (40 mg total) by mouth daily with breakfast. For four days 8 tablet 0  . ranitidine (ZANTAC) 150 MG tablet Take 1 tablet (150 mg total) by mouth daily. 30 tablet 6  . rosuvastatin (CRESTOR) 10 MG tablet take 1 tablet by mouth once daily 90 tablet 0  . torsemide (DEMADEX) 20 MG tablet Take 1 tablet (20 mg total) by mouth twice daily as directed. 60 tablet 10  . warfarin (COUMADIN) 5 MG tablet take 1 to 1.5 tablets by mouth once daily as directed BY COUMADIN CLINIC 45 tablet 3   No facility-administered medications prior to visit.      Allergies:   Avelox [moxifloxacin hcl in nacl]; Simvastatin; Ace inhibitors; and Latex   Social History   Socioeconomic History  . Marital status: Widowed    Spouse name: None  . Number of children: None  . Years of education: None  . Highest education level: None  Social Needs  . Financial resource  strain: None  . Food insecurity - worry: None  . Food insecurity - inability: None  . Transportation needs - medical: None  . Transportation needs - non-medical: None  Occupational History  . None  Tobacco Use  . Smoking status: Never Smoker  . Smokeless tobacco: Never Used  Substance and Sexual Activity  . Alcohol use: No  . Drug use: No  . Sexual activity: No  Other Topics Concern  . None  Social History Narrative   Not employed   Exercise- walking 1 hour daily.    Diet- eating healthy diet.       [facility]:   She is originally from [place]. Has always lived in [place]. 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 Currently has a small dog. No bird or mold exposure.       Current Social History 04/08/2017        Patient lives with daughter, Lafonda Mosses, in two level home 04/08/2017   Transportation: Patient has own vehicle and drives herself 04/08/2017   Important Relationships Daughter, Lafonda Mosses 04/08/2017    Pets: Shiatzu Evaristo Bury)  04/08/2017   Education / Work:  10 th grade/ None 04/08/2017   Interests / Fun: Read, do puzzles 04/08/2017   Current Stressors: None because she prays to God 04/08/2017   Religious / Personal Beliefs: Non-Denominational 04/08/2017   L. Ducatte, RN, BSN                                                                                                   Family History:  The patient's family history includes Diabetes in her brother and father; Hypertension in her brother and father.   ROS:   Please see the history of present illness.    ROS All other systems  reviewed and are negative.   PHYSICAL EXAM:   VS:  BP 128/74   Pulse 67   Ht 5\' 6"  (1.676 m)   Wt 212 lb 6.4 oz (96.3 kg)   LMP 07/26/2011   BMI 34.28 kg/m     General: Alert, oriented x3, no distress, moderately obese Head: no evidence of trauma, PERRL, EOMI, no exophtalmos or lid lag, no  myxedema, no xanthelasma; normal ears, nose and oropharynx Neck: normal jugular venous pulsations and no hepatojugular reflux; brisk carotid pulses without delay and no carotid bruits Chest: clear to auscultation, no signs of consolidation by percussion or palpation, normal fremitus, symmetrical and full respiratory excursions.  Healthy left subclavian defibrillator site Cardiovascular: normal position and quality of the apical impulse, regular rhythm, normal first and second heart sounds, no murmurs, rubs or gallops Abdomen: no tenderness or distention, no masses by palpation, no abnormal pulsatility or arterial bruits, normal bowel sounds, no hepatosplenomegaly Extremities: no clubbing, cyanosis or edema; 2+ radial, ulnar and brachial pulses bilaterally; 2+ right femoral, posterior tibial and dorsalis pedis pulses; 2+ left femoral, posterior tibial and dorsalis pedis pulses; no subclavian or femoral bruits Neurological: grossly nonfocal Psych: Normal mood and affect   Wt Readings from Last 3 Encounters:  06/09/17 212 lb 6.4 oz (96.3 kg)  06/03/17 212 lb (96.2 kg)  05/04/17 211 lb 3.2 oz (95.8 kg)      Studies/Labs Reviewed:   ECHO 01/01/2016 - Left ventricle: The cavity size was normal. Wall thickness was normal. Systolic function was normal. The estimated ejection   fraction was in the range of 50% to 55%. Mild hypokinesis of the basalinferior myocardium. Features are consistent with a   pseudonormal left ventricular filling pattern, with concomitant abnormal relaxation and increased filling pressure (grade 2   diastolic dysfunction). - Aortic valve: Mildly calcified annulus. There was trivial regurgitation. - Pulmonary arteries: Systolic pressure was mildly to moderately increased. PA peak pressure: 39 mm Hg (S).  EKG:  EKG is ordered today.  She has atrial paced ventricular sensed rhythm with a single PAC.  Normal QRS and repolarization pattern. QTc 481 ms (on dofetilide) Recent  Labs: 11/23/2016: ALT 55; BUN 13; Creat 1.23; Potassium 4.3; Sodium 136   Lipid Panel    Component Value Date/Time   CHOL 106 04/21/2016 1438   TRIG 73 04/21/2016 1438   HDL 44 (L) 04/21/2016 1438   CHOLHDL 2.4 04/21/2016 1438   VLDL 15 04/21/2016 1438   LDLCALC 47 04/21/2016 1438   LDLDIRECT 94 12/07/2007 2019     ASSESSMENT:    1. VT (ventricular tachycardia) (HCC)   2. Paroxysmal atrial fibrillation (HCC)   3. Chronic combined systolic and diastolic heart failure (HCC)   4. ICD (implantable cardioverter-defibrillator) in place   5. Long term current use of anticoagulant   6. Essential hypertension   7. Hypercholesterolemia   8. Obesity with body mass index (BMI) of 35.0 to 39.9 without comorbidity   9. OSA (obstructive sleep apnea)   10. Encounter for monitoring dofetilide therapy   11. Intermittent asthma without complication, unspecified asthma severity   12. Diabetes mellitus type 2 in obese (HCC)   13. CKD (chronic kidney disease) stage 3, GFR 30-59 ml/min (HCC)    1. VT: Despite marked improvement in left ventricular systolic function she has had ventricular tachycardia that required device intervention.  She is also at risk of developing torsades while in treatment with dofetilide.  She probably should continue implantable defibrillator therapy. 2. PAF: At  least from a symptomatic point of view her arrhythmia is well controlled with dofetilide.  Overall burden has recently increased from the previous average of 4% to about 6%, but it is not bothering her.  Ventricular rate control is not perfect, but is acceptable, especially since the episodes of arrhythmia or so brief. 3. CHF: Clinically appears to be euvolemic.  Stable optivol.  Most to her previously estimated "dry weight" of 210 pounds today.  No changes in medications recommended. 4. ICD: Very close to reaching RRT.  We discussed the generator replacement procedure in detail today and I warned her that the device  alarm may go off any day now.  She should not be apprehensive when this occurs.  We have 90 days to plan generator change out after that point.This procedure has been fully reviewed with the patient and written informed consent has been obtained. 5. Warfarin anticoagulation: Without bleeding complications or history of embolic stroke/TIA. CHADVasc 4 (CHF, gender, HTN, DM).  INR in therapeutic range today 6. HTN: Excellent control 7. HLP: On statin with all lipid parameters in the desirable range 8. Obesity: She will try to lose weight, but has not had a lot of success recently 9. OSA: 100% compliant with CPAP and has benefit from this therapy by her report 10. Tikosyn: Routine interval in the desirable range 11. Asthma: Seems to have fewer episodes of exacerbation while on bisoprolol rather than less selective beta-blockers 12. DM on long-term insulin therapy.  Her glycemic control remains suboptimal and has actually worsened with a recent hemoglobin A1c of 9.4%.  Consider use of SGL T-2 inhibitor such as Jardiance. 13. CKD: Recheck labs at least every 6 months while on dofetilide.  Most recent creatinine clearance was around 5.0   For some reason her insurance charges more for 10 mg bisoprolol tabs (nonformulary) than it does for 25 mg.  PLAN:  In order of problems listed above:  Fascinating. Have not seen one there before. Medication Adjustments/Labs and Tests Ordered: Current medicines are reviewed at length with the patient today.  Concerns regarding medicines are outlined above.  Medication changes, Labs and Tests ordered today are listed in the Patient Instructions below. Patient Instructions  Dr Royann Shivers recommends that you continue on your current medications as directed. Please refer to the Current Medication list given to you today.  Remote monitoring is used to monitor your Pacemaker of ICD from home. This monitoring reduces the number of office visits required to check your device  to one time per year. It allows [place] to keep an eye on the functioning of your device to ensure it is working properly. You are scheduled for a device check from home on Thursday, March 28th, 2019. You may send your transmission at any time that day. If you have a wireless device, the transmission will be sent automatically. After your physician reviews your transmission, you will receive a postcard with your next transmission date.  Dr Royann Shivers recommends that you schedule a follow-up appointment in 6 months with an ICD check. You will receive a reminder letter in the mail two months in advance. If you don't receive a letter, please call our office to schedule the follow-up appointment.  If you need a refill on your cardiac medications before your next appointment, please call your pharmacy.      Signed, Thurmon Fair, MD  06/10/2017 7:49 PM    [facility] [address] [place], [place], [place]  [address] Phone: [phone]; Fax: [phone]    

## 2017-06-14 DIAGNOSIS — I4891 Unspecified atrial fibrillation: Secondary | ICD-10-CM | POA: Diagnosis not present

## 2017-06-14 DIAGNOSIS — Z95810 Presence of automatic (implantable) cardiac defibrillator: Secondary | ICD-10-CM | POA: Diagnosis not present

## 2017-06-14 DIAGNOSIS — Z8674 Personal history of sudden cardiac arrest: Secondary | ICD-10-CM | POA: Diagnosis not present

## 2017-06-14 DIAGNOSIS — Z7901 Long term (current) use of anticoagulants: Secondary | ICD-10-CM | POA: Diagnosis not present

## 2017-06-15 DIAGNOSIS — R002 Palpitations: Secondary | ICD-10-CM | POA: Diagnosis not present

## 2017-06-17 ENCOUNTER — Telehealth: Payer: Self-pay | Admitting: Cardiology

## 2017-06-17 NOTE — Telephone Encounter (Signed)
We received a request in carelink to release patient to [facility]. Pt was confused by this. I instructed pt to call [name] to get clarification as to why the request was made and she is going to call back.

## 2017-06-17 NOTE — Telephone Encounter (Signed)
Patient does not want her carelink profile released to [facility].

## 2017-06-23 LAB — CUP PACEART INCLINIC DEVICE CHECK
Date Time Interrogation Session: 20190313120737
Implantable Lead Implant Date: 20120120
Implantable Lead Location: 753860
Implantable Pulse Generator Implant Date: 20120120
Lead Channel Setting Pacing Amplitude: 2 V
MDC IDC LEAD IMPLANT DT: 20120120
MDC IDC LEAD LOCATION: 753859
MDC IDC SET LEADCHNL RV PACING AMPLITUDE: 2.5 V
MDC IDC SET LEADCHNL RV PACING PULSEWIDTH: 0.4 ms
MDC IDC SET LEADCHNL RV SENSING SENSITIVITY: 0.3 mV

## 2017-06-29 ENCOUNTER — Ambulatory Visit (INDEPENDENT_AMBULATORY_CARE_PROVIDER_SITE_OTHER): Payer: Medicare Other | Admitting: Orthotics

## 2017-06-29 DIAGNOSIS — Z794 Long term (current) use of insulin: Secondary | ICD-10-CM

## 2017-06-29 DIAGNOSIS — E0842 Diabetes mellitus due to underlying condition with diabetic polyneuropathy: Secondary | ICD-10-CM

## 2017-06-29 DIAGNOSIS — L84 Corns and callosities: Secondary | ICD-10-CM | POA: Diagnosis not present

## 2017-06-29 DIAGNOSIS — M722 Plantar fascial fibromatosis: Secondary | ICD-10-CM | POA: Diagnosis not present

## 2017-06-29 DIAGNOSIS — E1140 Type 2 diabetes mellitus with diabetic neuropathy, unspecified: Secondary | ICD-10-CM | POA: Diagnosis not present

## 2017-06-29 DIAGNOSIS — I739 Peripheral vascular disease, unspecified: Secondary | ICD-10-CM

## 2017-06-29 NOTE — Progress Notes (Signed)

## 2017-07-05 ENCOUNTER — Encounter: Payer: Self-pay | Admitting: Family Medicine

## 2017-07-06 ENCOUNTER — Other Ambulatory Visit: Payer: Self-pay | Admitting: Family Medicine

## 2017-07-06 ENCOUNTER — Other Ambulatory Visit: Payer: Self-pay

## 2017-07-06 DIAGNOSIS — IMO0002 Reserved for concepts with insufficient information to code with codable children: Secondary | ICD-10-CM

## 2017-07-06 DIAGNOSIS — E1165 Type 2 diabetes mellitus with hyperglycemia: Principal | ICD-10-CM

## 2017-07-06 DIAGNOSIS — E1140 Type 2 diabetes mellitus with diabetic neuropathy, unspecified: Secondary | ICD-10-CM

## 2017-07-06 DIAGNOSIS — Z1231 Encounter for screening mammogram for malignant neoplasm of breast: Secondary | ICD-10-CM

## 2017-07-06 NOTE — Telephone Encounter (Signed)
Pt called nurse line requesting refill of novolog short pen as well as needles. [facility] [address] Pt call back [phone] Shawna Orleans, RN

## 2017-07-07 ENCOUNTER — Ambulatory Visit (INDEPENDENT_AMBULATORY_CARE_PROVIDER_SITE_OTHER): Payer: Medicare Other | Admitting: Pharmacist

## 2017-07-07 DIAGNOSIS — I4891 Unspecified atrial fibrillation: Secondary | ICD-10-CM | POA: Diagnosis not present

## 2017-07-07 DIAGNOSIS — Z7901 Long term (current) use of anticoagulants: Secondary | ICD-10-CM

## 2017-07-07 LAB — POCT INR: INR: 2.1

## 2017-07-08 ENCOUNTER — Ambulatory Visit (INDEPENDENT_AMBULATORY_CARE_PROVIDER_SITE_OTHER): Payer: Medicare Other | Admitting: *Deleted

## 2017-07-08 DIAGNOSIS — I5042 Chronic combined systolic (congestive) and diastolic (congestive) heart failure: Secondary | ICD-10-CM

## 2017-07-08 DIAGNOSIS — Z95810 Presence of automatic (implantable) cardiac defibrillator: Secondary | ICD-10-CM

## 2017-07-08 DIAGNOSIS — I472 Ventricular tachycardia, unspecified: Secondary | ICD-10-CM

## 2017-07-08 MED ORDER — BASAGLAR KWIKPEN 100 UNIT/ML ~~LOC~~ SOPN: 57.0000 [IU] | PEN_INJECTOR | Freq: Every day | SUBCUTANEOUS | 11 refills | Status: DC

## 2017-07-08 MED ORDER — INSULIN ASPART 100 UNIT/ML FLEXPEN: 5.0000 [IU] | PEN_INJECTOR | Freq: Two times a day (BID) | SUBCUTANEOUS | Status: DC

## 2017-07-08 MED ORDER — INSULIN PEN NEEDLE 31G X 8 MM MISC: 9 refills | Status: DC

## 2017-07-08 NOTE — Telephone Encounter (Signed)
Will provide novolog refill, and I also received a paper request to change Lantus to basaglar. Sent this new rx w same unit dosing.

## 2017-07-08 NOTE — Progress Notes (Signed)
EPIC Encounter for ICM Monitoring  Patient Name: [name] is a 67 y.o. female Date: 07/08/2017 Primary Care Physican: Garth Bigness, MD Primary Cardiologist:Croitoru Electrophysiologist: Croitoru Dry Weight: 210lbs  Clinical Status (08-Jul-2017 to 08-Jul-2017) Treated VT/VF 0 episodes  AT/AF 2 episodes  Time in AT/AF (7.8%)       Heart Failure questions reviewed, pt asymptomatic.   Thoracic impedance normal.  Prescribed dosage: Torsemide 20 mg 1 tablet twice daily. Potassium 20 mEq 1 tablet daily  Recommendations: No changes.  Encouraged to call for fluid symptoms.  Follow-up plan: ICM clinic phone appointment on 08/09/2017.    Copy of ICM check sent to Dr. Royann Shivers.   3 month ICM trend: 07/08/2017    1 Year ICM trend:       [name], RN 07/08/2017 4:41 PM

## 2017-07-09 NOTE — Progress Notes (Signed)
Great. Fluid stable and less AFib MCr

## 2017-07-09 NOTE — Progress Notes (Signed)
Remote ICD transmission.   

## 2017-07-12 ENCOUNTER — Telehealth: Payer: Self-pay | Admitting: Adult Health

## 2017-07-12 MED ORDER — RANITIDINE HCL 150 MG PO TABS: 150.0000 mg | ORAL_TABLET | Freq: Every day | ORAL | 5 refills | Status: DC

## 2017-07-12 NOTE — Telephone Encounter (Signed)
Received faxed refill request from [facility] [address] for Ranitidine 150mg  QD Former JN last seen 9.19.18 for Asthma Last seen 1.22.19 by TP for OSA  Ranitidine last refilled at the 9.19.18 office visit with JN Refills sent to pharmacy

## 2017-07-13 ENCOUNTER — Encounter: Payer: Self-pay | Admitting: Cardiology

## 2017-07-23 ENCOUNTER — Telehealth: Payer: Self-pay | Admitting: Cardiovascular Disease

## 2017-07-23 ENCOUNTER — Emergency Department (HOSPITAL_COMMUNITY)
Admission: EM | Admit: 2017-07-23 | Discharge: 2017-07-23 | Disposition: A | Discharge status: Home / Self Care | Payer: Medicare Other | Attending: Emergency Medicine | Admitting: Emergency Medicine

## 2017-07-23 ENCOUNTER — Encounter (HOSPITAL_COMMUNITY): Payer: Self-pay | Admitting: Emergency Medicine

## 2017-07-23 ENCOUNTER — Emergency Department (HOSPITAL_COMMUNITY): Payer: Medicare Other

## 2017-07-23 DIAGNOSIS — I5022 Chronic systolic (congestive) heart failure: Secondary | ICD-10-CM | POA: Insufficient documentation

## 2017-07-23 DIAGNOSIS — E1122 Type 2 diabetes mellitus with diabetic chronic kidney disease: Secondary | ICD-10-CM | POA: Diagnosis not present

## 2017-07-23 DIAGNOSIS — R002 Palpitations: Secondary | ICD-10-CM | POA: Insufficient documentation

## 2017-07-23 DIAGNOSIS — Z7901 Long term (current) use of anticoagulants: Secondary | ICD-10-CM | POA: Diagnosis not present

## 2017-07-23 DIAGNOSIS — I110 Hypertensive heart disease with heart failure: Secondary | ICD-10-CM | POA: Insufficient documentation

## 2017-07-23 DIAGNOSIS — N182 Chronic kidney disease, stage 2 (mild): Secondary | ICD-10-CM | POA: Insufficient documentation

## 2017-07-23 DIAGNOSIS — I130 Hypertensive heart and chronic kidney disease with heart failure and stage 1 through stage 4 chronic kidney disease, or unspecified chronic kidney disease: Secondary | ICD-10-CM | POA: Diagnosis not present

## 2017-07-23 DIAGNOSIS — Z91040 Latex allergy status: Secondary | ICD-10-CM | POA: Diagnosis not present

## 2017-07-23 DIAGNOSIS — Z95810 Presence of automatic (implantable) cardiac defibrillator: Secondary | ICD-10-CM | POA: Insufficient documentation

## 2017-07-23 DIAGNOSIS — R0602 Shortness of breath: Secondary | ICD-10-CM | POA: Diagnosis not present

## 2017-07-23 DIAGNOSIS — J4521 Mild intermittent asthma with (acute) exacerbation: Secondary | ICD-10-CM | POA: Diagnosis not present

## 2017-07-23 DIAGNOSIS — Z96652 Presence of left artificial knee joint: Secondary | ICD-10-CM | POA: Diagnosis not present

## 2017-07-23 DIAGNOSIS — Z79899 Other long term (current) drug therapy: Secondary | ICD-10-CM | POA: Diagnosis not present

## 2017-07-23 LAB — CBC WITH DIFFERENTIAL/PLATELET
Basophils Absolute: 0 10*3/uL (ref 0.0–0.1)
Basophils Relative: 0 %
EOS ABS: 0.1 10*3/uL (ref 0.0–0.7)
EOS PCT: 2 %
HCT: 38.3 % (ref 36.0–46.0)
Hemoglobin: 12.5 g/dL (ref 12.0–15.0)
LYMPHS ABS: 3.3 10*3/uL (ref 0.7–4.0)
Lymphocytes Relative: 51 %
MCH: 28.3 pg (ref 26.0–34.0)
MCHC: 32.6 g/dL (ref 30.0–36.0)
MCV: 86.7 fL (ref 78.0–100.0)
MONOS PCT: 9 %
Monocytes Absolute: 0.5 10*3/uL (ref 0.1–1.0)
Neutro Abs: 2.4 10*3/uL (ref 1.7–7.7)
Neutrophils Relative %: 38 %
PLATELETS: 234 10*3/uL (ref 150–400)
RBC: 4.42 MIL/uL (ref 3.87–5.11)
RDW: 14 % (ref 11.5–15.5)
WBC: 6.4 10*3/uL (ref 4.0–10.5)

## 2017-07-23 LAB — BASIC METABOLIC PANEL
Anion gap: 12 (ref 5–15)
BUN: 16 mg/dL (ref 6–20)
CHLORIDE: 103 mmol/L (ref 101–111)
CO2: 21 mmol/L — AB (ref 22–32)
CREATININE: 1.16 mg/dL — AB (ref 0.44–1.00)
Calcium: 8.2 mg/dL — ABNORMAL LOW (ref 8.9–10.3)
GFR calc Af Amer: 56 mL/min — ABNORMAL LOW (ref >60)
GFR calc non Af Amer: 48 mL/min — ABNORMAL LOW (ref >60)
Glucose, Bld: 137 mg/dL — ABNORMAL HIGH (ref 65–99)
Potassium: 3.8 mmol/L (ref 3.5–5.1)
SODIUM: 136 mmol/L (ref 135–145)

## 2017-07-23 LAB — PROTIME-INR
INR: 1.96
PROTHROMBIN TIME: 22.1 s — AB (ref 11.4–15.2)

## 2017-07-23 MED ORDER — IPRATROPIUM BROMIDE 0.02 % IN SOLN
0.5000 mg | Freq: Once | RESPIRATORY_TRACT | Status: AC
  Administered 2017-07-23: 0.5 mg via RESPIRATORY_TRACT
  Filled 2017-07-23: qty 2.5

## 2017-07-23 MED ORDER — PREDNISONE 20 MG PO TABS: 60.0000 mg | ORAL_TABLET | Freq: Every day | ORAL | 0 refills | Status: AC

## 2017-07-23 MED ORDER — ALBUTEROL (5 MG/ML) CONTINUOUS INHALATION SOLN
10.0000 mg/h | INHALATION_SOLUTION | Freq: Once | RESPIRATORY_TRACT | Status: AC
  Administered 2017-07-23: 10 mg/h via RESPIRATORY_TRACT
  Filled 2017-07-23: qty 20

## 2017-07-23 NOTE — ED Notes (Signed)
Patient had 5 beat run of vtach. EDP aware

## 2017-07-23 NOTE — Telephone Encounter (Signed)
°  1. Has your device fired? Something going off in there  2. Is you device beeping? no 3. Are you experiencing draining or swelling at device site? no  4. Are you calling to see if we received your device transmission? no 5. Have you passed out? She feel like she is going to faint    Please route to Device Clinic Pool

## 2017-07-23 NOTE — ED Triage Notes (Signed)
Per [facility] patient coming from home complaining of dizziness, palpitations, and SOB. Patient wheezing audibly. Afib rate of 120 initially with ems. Patient NSR rate of 77 on arrival to ed. Oxygen saturation 98% on room air. EMs administered 1 duoneb and 125 mg of solumedrol, and 4 mg of zofran. Denying palpitations on arrival. Denying pain. Alert and oriented x 4.

## 2017-07-23 NOTE — Telephone Encounter (Signed)
Ms. Russotto is having trouble describing her symptoms but feels like something is "not right". She has had this happen 3-4 times today, feels like she might pass out. She says she is now sweaty, checking BP- 95/61. Transmission received, normal device function, no recent episodes. Presenting rhythm ApVp/Vs. I have advised Ms. Pentland to call 911 and proceed to the ER, she would [facility] for her daughter to take her. I reinforced that I would call 911 for transportation due to presyncopal symptoms but that it was ultimately their decision, she will have her daughter call 911 at this time.

## 2017-07-23 NOTE — ED Provider Notes (Signed)
[facility] EMERGENCY DEPARTMENT Provider Note   CSN: [id number] Arrival date & time: 07/23/17  1513     History   Chief Complaint Chief Complaint  Patient presents with  . Shortness of Breath  . Palpitations    HPI [name] is a 67 y.o. female.  The history is provided by the patient.  Shortness of Breath  This is a recurrent problem. The current episode started 6 to 12 hours ago. The problem has not changed since onset.Associated symptoms include wheezing. Pertinent negatives include no fever, no rhinorrhea, no sore throat, no ear pain, no cough, no sputum production, no chest pain, no vomiting, no abdominal pain and no rash. It is unknown what precipitated the problem. She has tried beta-agonist inhalers and oral steroids for the symptoms. The treatment provided mild relief. Associated medical issues include asthma. Associated medical issues comments: afib on coumadin.    Past Medical History:  Diagnosis Date  . AICD (automatic cardioverter/defibrillator) present    Medtronic  . Anemia   . Anxiety   . Arthritis   . Asthma    has had multiple hospitalizations for this  . Atrial fibrillation (HCC)    ablation x 2 [facility], 01/2006, 2011.  on warfarin  . Cardiac arrest [facility]) jan 2012   in hospital for pneumonia when this occured- occured at the hospital  . CHF (congestive heart failure) (HCC) 2012   Echo 08/08/10 by [facility]. EF 35-45%. LV systolic function moderately reduced. Moderate global hypokinesis of LV.  RV systolic function moderately reduced. Mild MR. Trace AR.  Marland Kitchen Complication of anesthesia    difficult time waking up after anesthesia  . Depression   . Diabetes mellitus    Type 2  . Dysrhythmia    Atrial fibrillation  . GASTROESOPHAGEAL REFLUX, NO ESOPHAGITIS 06/10/2006   Qualifier: Diagnosis of  By: Abundio Miu    . GERD (gastroesophageal reflux disease)   . History of hiatal hernia   . Hyperlipidemia   . Hypertension     . Limb pain 06/04/2008   LLE, Baker's cyst in popliteal fossa, no DVT  . Non-ischemic cardiomyopathy (HCC)    echo 08/08/10 - EF 35-45% LV and RV systolic function mod reduced  . Primary localized osteoarthritis of left knee   . Primary localized osteoarthritis of right knee   . Primary localized osteoarthritis of right knee   . Sleep apnea    wears CPAP nightly  . Vocal cord disease   . Wears glasses     Patient Active Problem List   Diagnosis Date Noted  . Arthritis of carpometacarpal (CMC) joint of thumb 03/26/2016  . Acute idiopathic gout of right wrist 03/26/2016  . Primary localized osteoarthritis of right knee   . Visit for monitoring Tikosyn therapy 08/21/2015  . Long term current use of anticoagulant therapy 08/21/2015  . Preoperative cardiovascular examination 08/21/2015  . Ventricular tachycardia (HCC) 05/22/2015  . Obesity (BMI 30-39.9) 01/04/2014  . ICD (St. Jude Protecta dual-chamber),secondary prevention (VF arrest) January 2012 11/19/2012  . CKD (chronic kidney disease) stage 2, GFR 60-89 ml/min 02/22/2012  . Asthma, moderate persistent 12/10/2011  . Health maintenance examination 11/02/2011  . Diabetic neuropathy (HCC) 09/18/2011  . Congestive heart failure (HCC) 02/18/2011  . Chronic systolic heart failure (HCC) 05/09/2010  . OSTEOARTHRITIS, KNEE 08/28/2008  . DISEASE, VOCAL CORD NEC 10/13/2006  . Type 2 diabetes, uncontrolled, with neuropathy (HCC) 06/10/2006  . HYPERCHOLESTEROLEMIA 06/10/2006  . Essential hypertension 06/10/2006  .  Atrial fibrillation (HCC) 06/10/2006  . OSA on CPAP 06/10/2006    Past Surgical History:  Procedure Laterality Date  . BREAST EXCISIONAL BIOPSY Right 1999  . BREAST LUMPECTOMY Right   . CARDIAC DEFIBRILLATOR PLACEMENT  05/02/10   Medtronic Protecta XT-DR for CHF-VT, last download 04/12/12  . CHOLECYSTECTOMY    . COLONOSCOPY    . HAMMER TOE SURGERY Right   . KNEE ARTHROSCOPY Right   . PAF Ablation     By Dr Sampson Goon.  Now sees Dr Steele Berg [facility]  . TOTAL KNEE ARTHROPLASTY Left 09/17/2014  . TOTAL KNEE ARTHROPLASTY Left 09/17/2014   Procedure: TOTAL KNEE ARTHROPLASTY;  Surgeon: Salvatore Marvel, MD;  Location: [facility] OR;  Service: Orthopedics;  Laterality: Left;  pt has ICD  . TUBAL LIGATION       OB History   None      Home Medications    Prior to Admission medications   Medication Sig Start Date End Date Taking? Authorizing Provider  bisoprolol (ZEBETA) 5 MG tablet take 2 tablets by mouth once daily Patient taking differently: take 10 mg  tablets by mouth once daily 12/28/16  Yes Croitoru, Mihai, MD  budesonide-formoterol (SYMBICORT) 160-4.5 MCG/ACT inhaler Inhale 2 puffs into the lungs 2 (two) times daily. 12/31/16  Yes Roslynn Amble, MD  buPROPion (WELLBUTRIN XL) 150 MG 24 hr tablet take 1 tablet by mouth once daily Patient taking differently: take 150 mg tablet by mouth once daily 03/15/17  Yes Wendee Beavers, DO  chlorpheniramine (CHLOR-TRIMETON) 4 MG tablet 8mg  at night Patient taking differently: Take 4 mg by mouth at bedtime. 8mg  at night 10/17/12  Yes Storm Frisk, MD  dofetilide (TIKOSYN) 250 MCG capsule Take 1 capsule (250 mcg total) by mouth 2 (two) times daily. 06/19/16  Yes Croitoru, Rachelle Hora, MD  fluticasone (FLONASE) 50 MCG/ACT nasal spray instill 1 spray into each nostril twice a day 12/30/16  Yes Nestor, Donna Christen, MD  insulin aspart (NOVOLOG FLEXPEN) 100 UNIT/ML FlexPen Inject 5 Units into the skin 2 (two) times daily. Inject 5U with any meal containing 2 servings of carbs. Patient taking differently: Inject 5 Units into the skin 2 (two) times daily. Sliding Scale 07/08/17  Yes Garth Bigness, MD  Insulin Glargine (BASAGLAR KWIKPEN) 100 UNIT/ML SOPN Inject 0.57 mLs (57 Units total) into the skin at bedtime. 07/08/17  Yes Garth Bigness, MD  levalbuterol [facility] HFA) 45 MCG/ACT inhaler INHALE 1 TO 2 PUFFS BY MOUTH INTO THE LUNGS EVERY 4 HOURS IF NEEDED FOR WHEEZING 12/30/16  Yes  Roslynn Amble, MD  levalbuterol Pauline Aus) 0.63 MG/3ML nebulizer solution One vial in nebulizer four times daily as needed 06/04/17  Yes Parrett, Tammy S, NP  montelukast (SINGULAIR) 10 MG tablet take 1 tablet by mouth at bedtime 01/25/17  Yes Roslynn Amble, MD  polyethylene glycol powder (GLYCOLAX/MIRALAX) powder take 17GM (DISSOLVED IN WATER) by mouth once daily THREE TIMES A WEEK if needed as directed 01/21/16  Yes Rumley, Concordia N, DO  potassium chloride SA (K-DUR,KLOR-CON) 20 MEQ tablet Take 1 tablet (20 mEq total) by mouth daily as directed. Patient taking differently: Take 20 mEq by mouth as needed. Take 1 tablet (20 mEq total) by mouth daily as directed. 02/18/16  Yes Croitoru, Mihai, MD  ranitidine (ZANTAC) 150 MG tablet Take 1 tablet (150 mg total) by mouth daily. 07/12/17  Yes Parrett, Tammy S, NP  rosuvastatin (CRESTOR) 10 MG tablet take 1 tablet by mouth once daily Patient taking differently: take 1 tablet  by mouth once in the evening 06/07/17  Yes Runell Gess, MD  torsemide (DEMADEX) 20 MG tablet Take 1 tablet (20 mg total) by mouth twice daily as directed. Patient taking differently: Take 20 mg by mouth daily. Take 1 tablet (20 mg total) by mouth twice daily as directed. As needed with fluid build up 01/25/17  Yes Croitoru, Mihai, MD  warfarin (COUMADIN) 5 MG tablet take 1 to 1.5 tablets by mouth once daily as directed BY COUMADIN CLINIC Patient taking differently: Take 5 mg by mouth one time only at 6 PM. take 7.5 mg on Sunday Take 5 mg all the other days of the week Mon-SAt 04/19/17  Yes Croitoru, Mihai, MD  Insulin Pen Needle (B-D ULTRAFINE III SHORT PEN) 31G X 8 MM MISC Use as directed up to 4 times daily to check blood sugar Patient not taking: Reported on 07/23/2017 07/08/17   Garth Bigness, MD  mometasone-formoterol (DULERA) 200-5 MCG/ACT AERO Inhale 2 puffs into the lungs 2 (two) times daily. Patient not taking: Reported on 07/23/2017 05/04/17   Parrett, Virgel Bouquet,  NP  predniSONE (DELTASONE) 20 MG tablet Take 3 tablets (60 mg total) by mouth daily for 4 days. 07/23/17 07/27/17  Virgina Norfolk, DO    Family History Family History  Problem Relation Age of Onset  . Diabetes Father   . Hypertension Father   . Diabetes Brother   . Hypertension Brother   . Lung disease Neg Hx   . Cancer Neg Hx   . Rheumatologic disease Neg Hx     Social History Social History   Tobacco Use  . Smoking status: Never Smoker  . Smokeless tobacco: Never Used  Substance Use Topics  . Alcohol use: No  . Drug use: No     Allergies   Avelox [moxifloxacin hcl in nacl]; Simvastatin; Ace inhibitors; and Latex   Review of Systems Review of Systems  Constitutional: Negative for chills and fever.  HENT: Negative for ear pain, rhinorrhea and sore throat.   Eyes: Negative for pain and visual disturbance.  Respiratory: Positive for shortness of breath and wheezing. Negative for cough and sputum production.   Cardiovascular: Positive for palpitations. Negative for chest pain.  Gastrointestinal: Negative for abdominal pain and vomiting.  Genitourinary: Negative for dysuria and hematuria.  Musculoskeletal: Negative for arthralgias and back pain.  Skin: Negative for color change and rash.  Neurological: Negative for seizures and syncope.  All other systems reviewed and are negative.    Physical Exam Updated Vital Signs  ED Triage Vitals [07/23/17 1514]  Enc Vitals Group     BP      Pulse      Resp      Temp      Temp src      SpO2 98 %     Weight      Height      Head Circumference      Peak Flow      Pain Score      Pain Loc      Pain Edu?      Excl. in GC?     Physical Exam  Constitutional: She appears well-developed and well-nourished. No distress.  HENT:  Head: Normocephalic and atraumatic.  Eyes: Pupils are equal, round, and reactive to light. Conjunctivae are normal.  Neck: Normal range of motion. Neck supple.  Cardiovascular: Normal rate and  regular rhythm.  No murmur heard. Pulmonary/Chest: Accessory muscle usage present. Tachypnea noted. She has decreased breath  sounds. She has wheezes. She has no rhonchi. She has no rales.  Abdominal: Soft. There is no tenderness.  Musculoskeletal: She exhibits no edema.       Right lower leg: She exhibits no edema.  Neurological: She is alert.  Skin: Skin is warm and dry. Capillary refill takes less than 2 seconds.  Psychiatric: Her mood appears anxious.  Nursing note and vitals reviewed.    ED Treatments / Results  Labs (all labs ordered are listed, but only abnormal results are displayed) Labs Reviewed  BASIC METABOLIC PANEL - Abnormal; Notable for the following components:      Result Value   CO2 21 (*)    Glucose, Bld 137 (*)    Creatinine, Ser 1.16 (*)    Calcium 8.2 (*)    GFR calc non Af Amer 48 (*)    GFR calc Af Amer 56 (*)    All other components within normal limits  PROTIME-INR - Abnormal; Notable for the following components:   Prothrombin Time 22.1 (*)    All other components within normal limits  CBC WITH DIFFERENTIAL/PLATELET    EKG EKG Interpretation  Date/Time:  Friday July 23 2017 15:16:59 EDT Ventricular Rate:  77 PR Interval:    QRS Duration: 108 QT Interval:  434 QTC Calculation: 492 R Axis:   61 Text Interpretation:  Sinus rhythm Low voltage, precordial leads Borderline prolonged QT interval Confirmed by Cathren Laine ([id number]) on 07/23/2017 5:05:58 PM   Radiology Dg Chest Port 1 View  Result Date: 07/23/2017 CLINICAL DATA:  patient coming from home complaining of dizziness, palpitations, and SOB. Patient wheezing audibly. Afib rate of 120 initially with ems. Patient NSR rate of 77 on arrival to ed. Oxygen saturation 98% on room air. EMs administered 1 duoneb and 125 mg of solumedrol, and 4 mg of zofran. Denying palpitations on arrival. Denying pain. Alert and oriented x 4. Hx: htn, DM, CHF, AICD 2012 EXAM: PORTABLE CHEST 1 VIEW COMPARISON:   12/31/2015 FINDINGS: Mild enlargement of the cardiopericardial silhouette. Left anterior chest wall AICD, stable. No mediastinal or hilar masses. Lungs are clear.  No pleural effusion or pneumothorax. Skeletal structures are demineralized but grossly intact. IMPRESSION: No acute cardiopulmonary disease. Electronically Signed   By: Amie Portland M.D.   On: 07/23/2017 15:44    Procedures Procedures (including critical care time)  Medications Ordered in ED Medications  albuterol (PROVENTIL,VENTOLIN) solution continuous neb (10 mg/hr Nebulization Given 07/23/17 1528)  ipratropium (ATROVENT) nebulizer solution 0.5 mg (0.5 mg Nebulization Given 07/23/17 1528)     Initial Impression / Assessment and Plan / ED Course  I have reviewed the triage vital signs and the nursing notes.  Pertinent labs & imaging results that were available during my care of the patient were reviewed by me and considered in my medical decision making (see chart for details).     BELISSA KOOY is a 67 year old female with history of atrial fibrillation on Coumadin, asthma, heart failure who presents the ED with shortness of breath, wheezing.  Patient with overall normal vitals.  No fever.  Patient has been using her home nebulizer every 4 hours with some relief but continues to feel asthma related symptoms.  Patient has had no fever, no cough, no sputum production.  She states that she has also had intermittent palpitations.  EKG shows normal sinus rhythm with no signs of ischemic changes.  No atrial fibrillation.  Possibly secondary to albuterol use.  Patient has wheezing throughout on exam.  Some increased work of breathing as well.  No signs of volume overload on exam.  No rales, no edema, no JVD.  Patient had lab work done prior to my evaluation I was overall unremarkable.  No significant electrolyte abnormalities.  Chest x-ray showed no signs of pneumonia, pneumothorax, pleural effusion.  Patient had unremarkable creatinine.   Patient has ICD and she states that it has not fired.  She had short run of v. tach for several beats while in the ED but self resolved.  Patient was given continuous albuterol and ipratropium and IV Solu-Medrol was given already by EMS.  After breathing treatments in the ED patient felt improved.  She was able to ambulate in the ED without any hypoxia or shortness of breath.  Patient given prescription for prednisone and discharged in ED in good condition.  Recommend follow-up with cardiology for palpitations and told to return to the ED if symptoms worsen.  Final Clinical Impressions(s) / ED Diagnoses   Final diagnoses:  Palpitations  Mild intermittent asthma with exacerbation    ED Discharge Orders        Ordered    predniSONE (DELTASONE) 20 MG tablet  Daily     07/23/17 1753       Virgina Norfolk, DO 07/23/17 1829    Cathren Laine, MD 07/23/17 2139

## 2017-07-23 NOTE — ED Notes (Signed)
Pt ambulated in the hallway with no assist, O2 95-98% while ambulating, not complaints of shortness of breath.

## 2017-07-24 LAB — CUP PACEART REMOTE DEVICE CHECK
Battery Voltage: 2.61 V
Brady Statistic AP VP Percent: 0.2 %
Brady Statistic AS VS Percent: 2.64 %
Date Time Interrogation Session: 20190328062827
HIGH POWER IMPEDANCE MEASURED VALUE: 342 Ohm
HIGH POWER IMPEDANCE MEASURED VALUE: 68 Ohm
Implantable Lead Implant Date: 20120120
Implantable Lead Location: 753859
Implantable Pulse Generator Implant Date: 20120120
Lead Channel Pacing Threshold Amplitude: 0.375 V
Lead Channel Pacing Threshold Pulse Width: 0.4 ms
Lead Channel Sensing Intrinsic Amplitude: 0.625 mV
Lead Channel Sensing Intrinsic Amplitude: 12.5 mV
Lead Channel Setting Pacing Amplitude: 2.5 V
MDC IDC LEAD IMPLANT DT: 20120120
MDC IDC LEAD LOCATION: 753860
MDC IDC MSMT LEADCHNL RA IMPEDANCE VALUE: 399 Ohm
MDC IDC MSMT LEADCHNL RA PACING THRESHOLD PULSEWIDTH: 0.4 ms
MDC IDC MSMT LEADCHNL RA SENSING INTR AMPL: 0.625 mV
MDC IDC MSMT LEADCHNL RV IMPEDANCE VALUE: 456 Ohm
MDC IDC MSMT LEADCHNL RV PACING THRESHOLD AMPLITUDE: 0.625 V
MDC IDC MSMT LEADCHNL RV SENSING INTR AMPL: 12.5 mV
MDC IDC SET LEADCHNL RA PACING AMPLITUDE: 2 V
MDC IDC SET LEADCHNL RV PACING PULSEWIDTH: 0.4 ms
MDC IDC SET LEADCHNL RV SENSING SENSITIVITY: 0.3 mV
MDC IDC STAT BRADY AP VS PERCENT: 97.15 %
MDC IDC STAT BRADY AS VP PERCENT: 0.02 %
MDC IDC STAT BRADY RA PERCENT PACED: 96.72 %
MDC IDC STAT BRADY RV PERCENT PACED: 0.3 %

## 2017-07-28 ENCOUNTER — Telehealth: Payer: Self-pay

## 2017-07-28 NOTE — Telephone Encounter (Signed)
Pt requesting rx for her test strips and lancets be sent to [facility]. Did not have fax number, attempting to locate fax number. Will forward to MD ocne we receive fax number. Shawna Orleans, RN

## 2017-07-29 NOTE — Telephone Encounter (Signed)
Pt needs rx for her test strips and lancets sent to [facility]. Their fax number [phone] Shawna Orleans, RN

## 2017-07-30 ENCOUNTER — Ambulatory Visit: Payer: Medicare Other

## 2017-08-02 NOTE — Telephone Encounter (Signed)
Pt has a Prodigy meter. Fleeger, Maryjo Rochester, CMA

## 2017-08-02 NOTE — Telephone Encounter (Signed)
Received request for lancets and test strips. Need to know what time of meter she uses to refill lancets. Called patient to clarify, please ask her this if she calls back.

## 2017-08-03 MED ORDER — PRODIGY LANCETS 28G MISC: 1.0000 [IU] | Freq: Four times a day (QID) | 4 refills | Status: AC

## 2017-08-03 MED ORDER — GLUCOSE BLOOD VI STRP
ORAL_STRIP | 12 refills | Status: AC
Start: 2017-08-03 — End: ?

## 2017-08-03 NOTE — Telephone Encounter (Signed)
Sent prodigy test strips and lancets.

## 2017-08-03 NOTE — Addendum Note (Signed)
Addended by: Shon Hale on: 08/03/2017 03:23 PM   Modules accepted: Orders

## 2017-08-04 ENCOUNTER — Ambulatory Visit (INDEPENDENT_AMBULATORY_CARE_PROVIDER_SITE_OTHER): Payer: Medicare Other | Admitting: Pharmacist

## 2017-08-04 DIAGNOSIS — I4891 Unspecified atrial fibrillation: Secondary | ICD-10-CM

## 2017-08-04 DIAGNOSIS — Z7901 Long term (current) use of anticoagulants: Secondary | ICD-10-CM

## 2017-08-04 DIAGNOSIS — I480 Paroxysmal atrial fibrillation: Secondary | ICD-10-CM

## 2017-08-04 LAB — POCT INR: INR: 1.8

## 2017-08-04 NOTE — Telephone Encounter (Signed)
Pt's supplies were sent to [facility]. Were supposed to be faxed to [facility]. Their fax number  [phone] Please sent to [facility] so patient can receive supplies at lower cost. Thanks! Shawna Orleans, RN

## 2017-08-04 NOTE — Patient Instructions (Signed)
Description   Take 1.5 tablets today then continue taking 1 tablet every day except 1.5 tablets on Sunday.  Recheck INR in 3 weeks

## 2017-08-04 NOTE — Telephone Encounter (Signed)
Received fax form for these (lancets, strips) in my box. Filled out and placed in outgoing fax box.

## 2017-08-09 ENCOUNTER — Ambulatory Visit (INDEPENDENT_AMBULATORY_CARE_PROVIDER_SITE_OTHER): Payer: Medicare Other

## 2017-08-09 DIAGNOSIS — I5042 Chronic combined systolic (congestive) and diastolic (congestive) heart failure: Secondary | ICD-10-CM | POA: Diagnosis not present

## 2017-08-09 DIAGNOSIS — Z95810 Presence of automatic (implantable) cardiac defibrillator: Secondary | ICD-10-CM | POA: Diagnosis not present

## 2017-08-10 ENCOUNTER — Other Ambulatory Visit: Payer: Self-pay | Admitting: Pulmonary Disease

## 2017-08-10 NOTE — Progress Notes (Signed)
Looks good. And atrial fibrillation prevalence is back to her previous low burden,. MCr

## 2017-08-10 NOTE — Progress Notes (Signed)
EPIC Encounter for ICM Monitoring  Patient Name: [name] is a 67 y.o. female Date: 08/10/2017 Primary Care Physican: Garth Bigness, MD Primary Cardiologist:Croitoru Electrophysiologist: Croitoru Dry Weight: 210lbs  Since 23-Jul-2017 AT/AF 20 Episodes Time in AT/AF 0.5 hr/day (2.0%) Longest AT/AF 118 minutes Event Summary  ?1 VT-NS  ?8 hours in AT/AF Since Last Session       Heart Failure questions reviewed, pt asymptomatic.  ER visit 07/23/2017 for palpitations.  She had asthma exacerbation during same week of ER visits. She is feeling better.   Thoracic impedance normal.  Prescribed dosage: Torsemide 20 mg 1 tablet twice daily. Potassium 20 mEq 1 tablet daily  Recommendations: No changes.   Encouraged to call for fluid symptoms.  Follow-up plan: ICM clinic phone appointment on 09/09/2017.    Copy of ICM check sent to Dr. Royann Shivers.   3 month ICM trend: 08/09/2017    AT/AF   1 Year ICM trend:       [name], RN 08/10/2017 2:29 PM

## 2017-08-13 NOTE — Telephone Encounter (Signed)
Pt called stating [facility] still has not received her rx for diabetic supplies. Pulled Rx and placed back in to outgoing fax. Shawna Orleans, RN

## 2017-08-20 ENCOUNTER — Ambulatory Visit: Payer: Medicare Other

## 2017-08-24 ENCOUNTER — Ambulatory Visit (INDEPENDENT_AMBULATORY_CARE_PROVIDER_SITE_OTHER): Payer: Medicare Other | Admitting: Pharmacist

## 2017-08-24 DIAGNOSIS — I4891 Unspecified atrial fibrillation: Secondary | ICD-10-CM | POA: Diagnosis not present

## 2017-08-24 DIAGNOSIS — I480 Paroxysmal atrial fibrillation: Secondary | ICD-10-CM | POA: Diagnosis not present

## 2017-08-24 DIAGNOSIS — Z7901 Long term (current) use of anticoagulants: Secondary | ICD-10-CM | POA: Diagnosis not present

## 2017-08-24 LAB — POCT INR: INR: 2.2

## 2017-08-27 ENCOUNTER — Telehealth: Payer: Self-pay | Admitting: *Deleted

## 2017-08-27 DIAGNOSIS — Z4502 Encounter for adjustment and management of automatic implantable cardiac defibrillator: Secondary | ICD-10-CM

## 2017-08-27 DIAGNOSIS — I5022 Chronic systolic (congestive) heart failure: Secondary | ICD-10-CM

## 2017-08-27 DIAGNOSIS — I472 Ventricular tachycardia, unspecified: Secondary | ICD-10-CM

## 2017-08-27 NOTE — Telephone Encounter (Signed)
Medtronic Carelink alert received this morning for ICD at [facility] (as of today). It appears that Dr. Mitchell Heir discussed generator replacement with Ms. Riviere in February. Likely ok to schedule generator replacement without repeat OV. Routing to Dr. Royann Shivers and covering CMA for confirmation.   LMOM to return call to Device Clinic.

## 2017-08-27 NOTE — Telephone Encounter (Signed)
Will do POC INR on Monday June 3 prior to Thurs generator change.  Patient aware of appt time

## 2017-08-27 NOTE — Telephone Encounter (Signed)
Will schedule her for first available slot for gen change. She does not have to come back to clinic before the procedure, will check labs once we schedule the date. Would like to make sure INR is not >2.5 before the procedure, but do not plan to stop the warfarin.

## 2017-08-27 NOTE — Telephone Encounter (Signed)
Called patient.  Patient scheduled for 09/16/17 at 1:30p. Advised patient to come to our office for labs on Monday, 09/13/17 and we will review procedure instructions at that time as well. Patient verbalized understanding and agreed with plan.  Labs ordered.

## 2017-08-27 NOTE — Telephone Encounter (Signed)
Patient returned call. I informed her that her ICD has reached ERI and that I will send a note to MD nurse / CMA to call and schedule the procedure. Pt verbalized understanding.

## 2017-08-31 ENCOUNTER — Encounter: Payer: Self-pay | Admitting: Sports Medicine

## 2017-08-31 ENCOUNTER — Ambulatory Visit (INDEPENDENT_AMBULATORY_CARE_PROVIDER_SITE_OTHER): Payer: Medicare Other | Admitting: Sports Medicine

## 2017-08-31 DIAGNOSIS — L84 Corns and callosities: Secondary | ICD-10-CM | POA: Diagnosis not present

## 2017-08-31 DIAGNOSIS — E0842 Diabetes mellitus due to underlying condition with diabetic polyneuropathy: Secondary | ICD-10-CM | POA: Diagnosis not present

## 2017-08-31 DIAGNOSIS — I739 Peripheral vascular disease, unspecified: Secondary | ICD-10-CM

## 2017-08-31 DIAGNOSIS — B351 Tinea unguium: Secondary | ICD-10-CM | POA: Diagnosis not present

## 2017-08-31 DIAGNOSIS — M79609 Pain in unspecified limb: Secondary | ICD-10-CM | POA: Diagnosis not present

## 2017-08-31 NOTE — Progress Notes (Signed)
Subjective: [name] is a 67 y.o. female patient with history of diabetes who presents to office today complaining of long,mildly painful nails  while ambulating in shoes; unable to trim. Patient states that the glucose reading this morning was not recorded. Patient denies any new changes in medication or new problems since last visit.   Patient Active Problem List   Diagnosis Date Noted  . Arthritis of carpometacarpal (CMC) joint of thumb 03/26/2016  . Acute idiopathic gout of right wrist 03/26/2016  . Primary localized osteoarthritis of right knee   . Visit for monitoring Tikosyn therapy 08/21/2015  . Long term current use of anticoagulant therapy 08/21/2015  . Preoperative cardiovascular examination 08/21/2015  . Ventricular tachycardia ([place]) 05/22/2015  . Obesity (BMI 30-39.9) 01/04/2014  . ICD (St. Jude Protecta dual-chamber),secondary prevention (VF arrest) January 2012 11/19/2012  . CKD (chronic kidney disease) stage 2, GFR 60-89 ml/min 02/22/2012  . Asthma, moderate persistent 12/10/2011  . Health maintenance examination 11/02/2011  . Diabetic neuropathy ([place]) 09/18/2011  . Congestive heart failure ([place]) 02/18/2011  . Chronic systolic heart failure ([place]) 05/09/2010  . OSTEOARTHRITIS, KNEE 08/28/2008  . DISEASE, VOCAL CORD NEC 10/13/2006  . Type 2 diabetes, uncontrolled, with neuropathy ([place]) 06/10/2006  . HYPERCHOLESTEROLEMIA 06/10/2006  . Essential hypertension 06/10/2006  . Atrial fibrillation ([place]) 06/10/2006  . OSA on CPAP 06/10/2006   Current Outpatient Medications on File Prior to Visit  Medication Sig Dispense Refill  . bisoprolol (ZEBETA) 5 MG tablet take 2 tablets by mouth once daily (Patient taking differently: take 10 mg  tablets by mouth once daily) 60 tablet 10  . budesonide-formoterol (SYMBICORT) 160-4.5 MCG/ACT inhaler Inhale 2 puffs into the lungs 2 (two) times daily. 1 Inhaler 6  . buPROPion (WELLBUTRIN XL) 150 MG 24 hr tablet take 1 tablet by mouth once  daily (Patient taking differently: take 150 mg tablet by mouth once daily) 90 tablet 3  . chlorpheniramine (CHLOR-TRIMETON) 4 MG tablet '8mg'$  at night (Patient taking differently: Take 4 mg by mouth at bedtime. '8mg'$  at night) 14 tablet 0  . dofetilide (TIKOSYN) 250 MCG capsule Take 1 capsule (250 mcg total) by mouth 2 (two) times daily. 60 capsule 5  . fluticasone (FLONASE) 50 MCG/ACT nasal spray instill 1 spray into each nostril twice a day 16 g 5  . glucose blood test strip Use as instructed 100 each 12  . insulin aspart (NOVOLOG FLEXPEN) 100 UNIT/ML FlexPen Inject 5 Units into the skin 2 (two) times daily. Inject 5U with any meal containing 2 servings of carbs. (Patient taking differently: Inject 5 Units into the skin 2 (two) times daily. Sliding Scale) 15 mL   . Insulin Glargine (BASAGLAR KWIKPEN) 100 UNIT/ML SOPN Inject 0.57 mLs (57 Units total) into the skin at bedtime. 3 pen 11  . Insulin Pen Needle (B-D ULTRAFINE III SHORT PEN) 31G X 8 MM MISC Use as directed up to 4 times daily to check blood sugar (Patient not taking: Reported on 07/23/2017) 100 each 9  . levalbuterol (XOPENEX HFA) 45 MCG/ACT inhaler INHALE 1 TO 2 PUFFS BY MOUTH INTO THE LUNGS EVERY 4 HOURS IF NEEDED FOR WHEEZING 15 Inhaler 5  . levalbuterol (XOPENEX) 0.63 MG/3ML nebulizer solution One vial in nebulizer four times daily as needed 360 mL 5  . mometasone-formoterol (DULERA) 200-5 MCG/ACT AERO Inhale 2 puffs into the lungs 2 (two) times daily. (Patient not taking: Reported on 07/23/2017) 1 Inhaler 5  . montelukast (SINGULAIR) 10 MG tablet TAKE 1 TABLET BY MOUTH  AT BEDTIME 30 tablet 4  . polyethylene glycol powder (GLYCOLAX/MIRALAX) powder take 17GM (DISSOLVED IN WATER) by mouth once daily THREE TIMES A WEEK if needed as directed 500 g 3  . potassium chloride SA (K-DUR,KLOR-CON) 20 MEQ tablet Take 1 tablet (20 mEq total) by mouth daily as directed. (Patient taking differently: Take 20 mEq by mouth as needed. Take 1 tablet (20 mEq  total) by mouth daily as directed.) 30 tablet 11  . PRODIGY LANCETS 28G MISC 1 Units by Does not apply route 4 (four) times daily. 100 each 4  . ranitidine (ZANTAC) 150 MG tablet Take 1 tablet (150 mg total) by mouth daily. 30 tablet 5  . rosuvastatin (CRESTOR) 10 MG tablet take 1 tablet by mouth once daily (Patient taking differently: take 1 tablet by mouth once in the evening) 90 tablet 0  . torsemide (DEMADEX) 20 MG tablet Take 1 tablet (20 mg total) by mouth twice daily as directed. (Patient taking differently: Take 20 mg by mouth daily. Take 1 tablet (20 mg total) by mouth twice daily as directed. As needed with fluid build up) 60 tablet 10  . warfarin (COUMADIN) 5 MG tablet take 1 to 1.5 tablets by mouth once daily as directed BY COUMADIN CLINIC (Patient taking differently: Take 5 mg by mouth one time only at 6 PM. take 7.5 mg on Sunday Take 5 mg all the other days of the week Mon-SAt) 45 tablet 3   No current facility-administered medications on file prior to visit.    Allergies  Allergen Reactions  . Avelox [Moxifloxacin Hcl In Nacl] Other (See Comments)    Cardiac arrest per pt  . Simvastatin Other (See Comments)    myalgias  . Ace Inhibitors Cough    Dry cough   . Latex Rash    Recent Results (from the past 2160 hour(s))  HgB A1c     Status: Abnormal   Collection Time: 06/03/17  8:58 AM  Result Value Ref Range   Hemoglobin A1C 9.4   POCT INR     Status: None   Collection Time: 06/09/17  9:30 AM  Result Value Ref Range   INR 2.5   CUP PACEART INCLINIC DEVICE CHECK     Status: None   Collection Time: 06/09/17  5:01 PM  Result Value Ref Range   Pulse Generator Manufacturer MERM    Date Time Interrogation Session [phone]    Pulse Gen Model [id number] Protecta XT DR    Pulse Gen Serial Number [id number]    Clinic Name [facility] Pulse Generator Type Implantable Cardiac Defibulator    Implantable Pulse Generator Implant Date [id number]    Implantable  Lead Manufacturer [facility]    Implantable Lead Model 5076 CapSureFix Novus    Implantable Lead Serial Number [id number]    Implantable Lead Implant Date 49675916    Implantable Lead Location [id number]    Implantable Lead Manufacturer [facility]    Implantable Lead Model 6935 Sprint Quattro Secure S    Implantable Lead Serial Number [id number]    Implantable Lead Implant Date 38466599    Implantable Lead Location [id number]    Lead Channel Setting Sensing Sensitivity 0.3 mV   Lead Channel Setting Pacing Amplitude 2 V   Lead Channel Setting Pacing Pulse Width 0.4 ms   Lead Channel Setting Pacing Amplitude 2.5 V  POCT INR     Status: None   Collection Time: 07/07/17  9:08 AM  Result Value Ref Range   INR  2.1   CUP PACEART REMOTE DEVICE CHECK     Status: None   Collection Time: 07/08/17  6:28 AM  Result Value Ref Range   Date Time Interrogation Session [id number]    Pulse Generator Manufacturer MERM    Pulse Gen Model [id number] Protecta XT DR    Pulse Gen Serial Number [id number]    Clinic Name [facility]    Implantable Pulse Generator Type Implantable Cardiac Defibulator    Implantable Pulse Generator Implant Date [id number]    Implantable Lead Manufacturer [facility]    Implantable Lead Model 5076 CapSureFix Novus    Implantable Lead Serial Number [id number]    Implantable Lead Implant Date [id number]    Implantable Lead Location [id number]    Implantable Lead Manufacturer [facility]    Implantable Lead Model 6935 Sprint Quattro Secure S    Implantable Lead Serial Number [id number]    Implantable Lead Implant Date 80998338    Implantable Lead Location [id number]    Lead Channel Setting Sensing Sensitivity 0.3 mV   Lead Channel Setting Pacing Amplitude 2 V   Lead Channel Setting Pacing Pulse Width 0.4 ms   Lead Channel Setting Pacing Amplitude 2.5 V   Lead Channel Impedance Value 399 ohm   Lead Channel Sensing Intrinsic Amplitude 0.625 mV   Lead Channel Sensing Intrinsic Amplitude 0.625 mV   Lead Channel  Pacing Threshold Amplitude 0.375 V   Lead Channel Pacing Threshold Pulse Width 0.4 ms   Lead Channel Impedance Value 456 ohm   Lead Channel Sensing Intrinsic Amplitude 12.5 mV   Lead Channel Sensing Intrinsic Amplitude 12.5 mV   Lead Channel Pacing Threshold Amplitude 0.625 V   Lead Channel Pacing Threshold Pulse Width 0.4 ms   HighPow Impedance 342 ohm   HighPow Impedance 68 ohm   Battery Status OK    Battery Voltage 2.61 V   Brady Statistic RA Percent Paced 96.72 %   Brady Statistic RV Percent Paced 0.30 %   Brady Statistic AP VP Percent 0.20 %   Brady Statistic AS VP Percent 0.02 %   Brady Statistic AP VS Percent 97.15 %   Brady Statistic AS VS Percent 2.64 %   Eval Rhythm Ap/Vs   Basic metabolic panel     Status: Abnormal   Collection Time: 07/23/17  3:31 PM  Result Value Ref Range   Sodium 136 135 - 145 mmol/L   Potassium 3.8 3.5 - 5.1 mmol/L   Chloride 103 101 - 111 mmol/L   CO2 21 (L) 22 - 32 mmol/L   Glucose, Bld 137 (H) 65 - 99 mg/dL   BUN 16 6 - 20 mg/dL   Creatinine, Ser 1.16 (H) 0.44 - 1.00 mg/dL   Calcium 8.2 (L) 8.9 - 10.3 mg/dL   GFR calc non Af Amer 48 (L) >60 mL/min   GFR calc Af Amer 56 (L) >60 mL/min    Comment: (NOTE) The eGFR has been calculated using the CKD EPI equation. This calculation has not been validated in all clinical situations. eGFR's persistently <60 mL/min signify possible Chronic Kidney Disease.    Anion gap 12 5 - 15    Comment: Performed at [facility] [address], [place], [place] [address]  CBC WITH DIFFERENTIAL     Status: None   Collection Time: 07/23/17  3:31 PM  Result Value Ref Range   WBC 6.4 4.0 - 10.5 K/uL   RBC 4.42 3.87 - 5.11 MIL/uL   Hemoglobin 12.5 12.0 - 15.0 g/dL  HCT 38.3 36.0 - 46.0 %   MCV 86.7 78.0 - 100.0 fL   MCH 28.3 26.0 - 34.0 pg   MCHC 32.6 30.0 - 36.0 g/dL   RDW 14.0 11.5 - 15.5 %   Platelets 234 150 - 400 K/uL   Neutrophils Relative % 38 %   Neutro Abs 2.4 1.7 - 7.7 K/uL   Lymphocytes  Relative 51 %   Lymphs Abs 3.3 0.7 - 4.0 K/uL   Monocytes Relative 9 %   Monocytes Absolute 0.5 0.1 - 1.0 K/uL   Eosinophils Relative 2 %   Eosinophils Absolute 0.1 0.0 - 0.7 K/uL   Basophils Relative 0 %   Basophils Absolute 0.0 0.0 - 0.1 K/uL    Comment: Performed at [facility] [address], [place], [place] [address]  Protime-INR     Status: Abnormal   Collection Time: 07/23/17  3:31 PM  Result Value Ref Range   Prothrombin Time 22.1 (H) 11.4 - 15.2 seconds   INR 1.96     Comment: Performed at [facility] [address] [place], [place] [address]  POCT INR     Status: None   Collection Time: 08/04/17  9:16 AM  Result Value Ref Range   INR 1.8   POCT INR     Status: None   Collection Time: 08/24/17  9:10 AM  Result Value Ref Range   INR 2.2     Objective: General: Patient is awake, alert, and oriented x 3 and in no acute distress.  Integument: Skin is warm, dry and supple bilateral. Nails are tender, long, thickened and  dystrophic with subungual debris, consistent with onychomycosis, 1-5 bilateral. No signs of infection. + callus hallux, sub 3-4, and 5th toes bilateral. Remaining integument unremarkable.  Vasculature:  Dorsalis Pedis pulse 1/4 bilateral. Posterior Tibial pulse  1/4 bilateral.  Capillary fill time <3 sec 1-5 bilateral. Scant hair growth to the level of the digits. Temperature gradient within normal limits. No varicosities present bilateral. No edema present bilateral.   Neurology: The patient has intact sensation measured with a 5.07/10g Semmes Weinstein Monofilament at all pedal sites bilateral . Vibratory sensation diminished bilateral with tuning fork. No Babinski sign present bilateral.   Musculoskeletal:  Asymptomatic pes planus, bunion and hammertoe pedal deformities noted bilateral. Muscular strength 5/5 in all lower extremity muscular groups bilateral without pain on range of motion . No tenderness with calf compression  bilateral.  Assessment and Plan: Problem List Items Addressed This Visit      Endocrine   Diabetic neuropathy ([place]) (Chronic)    Other Visit Diagnoses    Pain due to onychomycosis of nail    -  Primary   Corns and callosities       Peripheral arterial disease ([place])          -Examined patient. -Discussed and educated patient on diabetic foot care, especially with  regards to the vascular, neurological and musculoskeletal systems.  -Stressed the importance of good glycemic control and the detriment of not  controlling glucose levels in relation to the foot. -ABN signed -Mechanically debrided callus >5 using sterile chisel blade and debrided all nails 1-5 bilateral using sterile nail nipper and filed with dremel without incident  -Continue with diabetic shoes and insoles  -Patient to return  in 3 months for at risk foot care -Patient advised to call the office if any problems or questions arise in the meantime.  Landis Martins, DPM

## 2017-09-04 ENCOUNTER — Telehealth: Payer: Self-pay | Admitting: Cardiology

## 2017-09-04 NOTE — Telephone Encounter (Signed)
Pt called with ICD beeping for last 2 days, she is already set up on 09/28/17 for device change out.  I reassured pt and discussed with Ms. Hayes pacer rep.  I will ask device clinic to bring pt in on Tuesday .

## 2017-09-07 ENCOUNTER — Telehealth: Payer: Self-pay | Admitting: *Deleted

## 2017-09-07 NOTE — Telephone Encounter (Signed)
I called [name] this morning due to message from Nada Boozer, NP. Pt heard alert tone x 2 days on Saturday from her ICD. This is the alert for RRT (since 08/27/17) and pt has just now heard alert. She elects to have it turned off tomorrow since she will be nearby for an appointment. Scheduled for 8:30am 09/08/17.

## 2017-09-08 ENCOUNTER — Other Ambulatory Visit: Payer: Self-pay

## 2017-09-08 ENCOUNTER — Ambulatory Visit (INDEPENDENT_AMBULATORY_CARE_PROVIDER_SITE_OTHER): Payer: Medicare Other | Admitting: Family Medicine

## 2017-09-08 ENCOUNTER — Ambulatory Visit (INDEPENDENT_AMBULATORY_CARE_PROVIDER_SITE_OTHER): Payer: Self-pay | Admitting: *Deleted

## 2017-09-08 ENCOUNTER — Encounter: Payer: Self-pay | Admitting: Family Medicine

## 2017-09-08 VITALS — BP 122/76 | HR 80 | Temp 98.3°F | Wt 214.0 lb

## 2017-09-08 DIAGNOSIS — I472 Ventricular tachycardia, unspecified: Secondary | ICD-10-CM

## 2017-09-08 DIAGNOSIS — E1165 Type 2 diabetes mellitus with hyperglycemia: Secondary | ICD-10-CM

## 2017-09-08 DIAGNOSIS — IMO0002 Reserved for concepts with insufficient information to code with codable children: Secondary | ICD-10-CM

## 2017-09-08 DIAGNOSIS — Z4502 Encounter for adjustment and management of automatic implantable cardiac defibrillator: Secondary | ICD-10-CM

## 2017-09-08 DIAGNOSIS — E1140 Type 2 diabetes mellitus with diabetic neuropathy, unspecified: Secondary | ICD-10-CM | POA: Diagnosis present

## 2017-09-08 LAB — CUP PACEART REMOTE DEVICE CHECK
Battery Voltage: 2.61 V
Brady Statistic AP VP Percent: 0.43 %
Brady Statistic AS VS Percent: 3.25 %
Date Time Interrogation Session: 20190517061509
HIGH POWER IMPEDANCE MEASURED VALUE: 361 Ohm
HighPow Impedance: 76 Ohm
Implantable Lead Location: 753859
Implantable Lead Location: 753860
Implantable Lead Model: 6935
Implantable Pulse Generator Implant Date: 20120120
Lead Channel Impedance Value: 456 Ohm
Lead Channel Pacing Threshold Amplitude: 0.375 V
Lead Channel Pacing Threshold Pulse Width: 0.4 ms
Lead Channel Sensing Intrinsic Amplitude: 0.875 mV
Lead Channel Sensing Intrinsic Amplitude: 12.625 mV
Lead Channel Setting Sensing Sensitivity: 0.3 mV
MDC IDC LEAD IMPLANT DT: 20120120
MDC IDC LEAD IMPLANT DT: 20120120
MDC IDC MSMT LEADCHNL RA IMPEDANCE VALUE: 418 Ohm
MDC IDC MSMT LEADCHNL RA PACING THRESHOLD PULSEWIDTH: 0.4 ms
MDC IDC MSMT LEADCHNL RA SENSING INTR AMPL: 0.875 mV
MDC IDC MSMT LEADCHNL RV PACING THRESHOLD AMPLITUDE: 0.625 V
MDC IDC MSMT LEADCHNL RV SENSING INTR AMPL: 13.25 mV
MDC IDC SET LEADCHNL RA PACING AMPLITUDE: 2 V
MDC IDC SET LEADCHNL RV PACING AMPLITUDE: 2.5 V
MDC IDC SET LEADCHNL RV PACING PULSEWIDTH: 0.4 ms
MDC IDC STAT BRADY AP VS PERCENT: 96.28 %
MDC IDC STAT BRADY AS VP PERCENT: 0.04 %
MDC IDC STAT BRADY RA PERCENT PACED: 95.15 %
MDC IDC STAT BRADY RV PERCENT PACED: 0.5 %

## 2017-09-08 LAB — POCT GLYCOSYLATED HEMOGLOBIN (HGB A1C): HBA1C, POC (CONTROLLED DIABETIC RANGE): 9.3 % — AB (ref 0.0–7.0)

## 2017-09-08 NOTE — Assessment & Plan Note (Signed)
Still poorly controlled, patient disappointed by this. She forgot to adjust her sliding scale as we discussed last time. Plan to increase novolog to 5U q breakfast and 7U q dinner. Also scheduled her a follow up with Dr. Raymondo Band to consider adjustments to her regimen and continued counseling. Counseled again on DM diet - she is eating a fair amount of carbs, and she seems to have limited understanding of how this influences CBGs. F/u with me after Dr. Raymondo Band or in 3 months.
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
## 2017-09-08 NOTE — Progress Notes (Signed)
Device checked in clinic to turn ERI alert tone off. 2 NSVT episodes- longest 13 beats. 1.1% AT/AF- warfarin, tikosyn. Generator replacement scheduled 09/28/17.

## 2017-09-08 NOTE — Patient Instructions (Signed)
It was a pleasure to see you today! Thank you for choosing [facility] for your primary care. [name] was seen for diabetes.   Our plans for today were:  Keep your Basaglar (long acting insulin) at 57U.   INCREASE your novolog to 5U with breakfast, and 7U at dinner. Don't use these if your blood sugar is below 100.   See Dr. Raymondo Band as we scheduled, bring your meter when come see Dr. Raymondo Band.   Best,  Dr. Chanetta Marshall

## 2017-09-09 ENCOUNTER — Ambulatory Visit (INDEPENDENT_AMBULATORY_CARE_PROVIDER_SITE_OTHER): Payer: Medicare Other

## 2017-09-09 DIAGNOSIS — Z4502 Encounter for adjustment and management of automatic implantable cardiac defibrillator: Secondary | ICD-10-CM | POA: Diagnosis not present

## 2017-09-09 DIAGNOSIS — H43811 Vitreous degeneration, right eye: Secondary | ICD-10-CM | POA: Diagnosis not present

## 2017-09-09 DIAGNOSIS — H35411 Lattice degeneration of retina, right eye: Secondary | ICD-10-CM | POA: Diagnosis not present

## 2017-09-09 DIAGNOSIS — H43821 Vitreomacular adhesion, right eye: Secondary | ICD-10-CM | POA: Diagnosis not present

## 2017-09-09 DIAGNOSIS — H35363 Drusen (degenerative) of macula, bilateral: Secondary | ICD-10-CM | POA: Diagnosis not present

## 2017-09-09 DIAGNOSIS — I5022 Chronic systolic (congestive) heart failure: Secondary | ICD-10-CM

## 2017-09-09 NOTE — Progress Notes (Signed)
EPIC Encounter for ICM Monitoring  Patient Name: ARIN VANOSDOL is a 67 y.o. female Date: 09/09/2017 Primary Care Physican: Sela Hilding, MD Primary Cardiologist:Croitoru Electrophysiologist: Croitoru Dry Weight: 212lbs  Clinical Status (08-Sep-2017 to 09-Sep-2017) Treated VT/VF 0 episodes  AT/AF 18 episodes  Time in AT/AF (16.0%)       Heart Failure questions reviewed, pt asymptomatic.   Battery replacement scheduled for 09/28/2017   Thoracic impedance normal.  Prescribed dosage: Torsemide 20 mg 1 tablet twice daily. Potassium 20 mEq 1 tablet daily  LABS:  07/23/2017 Creatinine 1.16, BUN 16, Potassium 3.8, Sodium 136, EGFR 48-56  Recommendations: No changes.   Encouraged to call for fluid symptoms.  Follow-up plan: ICM clinic phone appointment on 11/15/2017.    Copy of ICM check sent to Dr. Sallyanne Kuster.  3 month ICM trend: 09/09/2017    AT/AF   1 Year ICM trend:       [name], RN 09/09/2017 11:48 AM

## 2017-09-10 NOTE — Progress Notes (Signed)
Thank you MCr 

## 2017-09-14 ENCOUNTER — Ambulatory Visit
Admission: RE | Admit: 2017-09-14 | Discharge: 2017-09-14 | Disposition: A | Discharge status: Home / Self Care | Payer: Medicare Other | Source: Ambulatory Visit | Attending: Family Medicine | Admitting: Family Medicine

## 2017-09-14 DIAGNOSIS — Z1231 Encounter for screening mammogram for malignant neoplasm of breast: Secondary | ICD-10-CM

## 2017-09-17 ENCOUNTER — Ambulatory Visit (INDEPENDENT_AMBULATORY_CARE_PROVIDER_SITE_OTHER): Payer: Medicare Other | Admitting: Pharmacist

## 2017-09-17 DIAGNOSIS — Z7901 Long term (current) use of anticoagulants: Secondary | ICD-10-CM

## 2017-09-17 DIAGNOSIS — I4891 Unspecified atrial fibrillation: Secondary | ICD-10-CM | POA: Diagnosis not present

## 2017-09-17 DIAGNOSIS — Z4502 Encounter for adjustment and management of automatic implantable cardiac defibrillator: Secondary | ICD-10-CM | POA: Diagnosis not present

## 2017-09-17 DIAGNOSIS — I472 Ventricular tachycardia: Secondary | ICD-10-CM | POA: Diagnosis not present

## 2017-09-17 DIAGNOSIS — I5022 Chronic systolic (congestive) heart failure: Secondary | ICD-10-CM | POA: Diagnosis not present

## 2017-09-17 LAB — POCT INR: INR: 2.1 (ref 2.0–3.0)

## 2017-09-18 LAB — BASIC METABOLIC PANEL
BUN/Creatinine Ratio: 13 (ref 12–28)
BUN: 12 mg/dL (ref 8–27)
CALCIUM: 8.5 mg/dL — AB (ref 8.7–10.3)
CO2: 25 mmol/L (ref 20–29)
CREATININE: 0.95 mg/dL (ref 0.57–1.00)
Chloride: 102 mmol/L (ref 96–106)
GFR calc Af Amer: 72 mL/min/{1.73_m2} (ref >59)
GFR calc non Af Amer: 63 mL/min/{1.73_m2} (ref >59)
Glucose: 199 mg/dL — ABNORMAL HIGH (ref 65–99)
POTASSIUM: 4.2 mmol/L (ref 3.5–5.2)
Sodium: 140 mmol/L (ref 134–144)

## 2017-09-18 LAB — CBC
HEMOGLOBIN: 11.9 g/dL (ref 11.1–15.9)
Hematocrit: 36.5 % (ref 34.0–46.6)
MCH: 29 pg (ref 26.6–33.0)
MCHC: 32.6 g/dL (ref 31.5–35.7)
MCV: 89 fL (ref 79–97)
Platelets: 219 10*3/uL (ref 150–450)
RBC: 4.11 x10E6/uL (ref 3.77–5.28)
RDW: 13.5 % (ref 12.3–15.4)
WBC: 4.9 10*3/uL (ref 3.4–10.8)

## 2017-09-18 LAB — PROTIME-INR
INR: 1.9 — AB (ref 0.8–1.2)
Prothrombin Time: 19.1 s — ABNORMAL HIGH (ref 9.1–12.0)

## 2017-09-20 ENCOUNTER — Other Ambulatory Visit: Payer: Self-pay | Admitting: Cardiovascular Disease

## 2017-09-20 NOTE — Telephone Encounter (Signed)
Rx(s) sent to pharmacy electronically.  

## 2017-09-22 ENCOUNTER — Other Ambulatory Visit: Payer: Self-pay

## 2017-09-22 MED ORDER — INSULIN ASPART 100 UNIT/ML FLEXPEN: 5.0000 [IU] | PEN_INJECTOR | SUBCUTANEOUS | Status: DC

## 2017-09-22 NOTE — Telephone Encounter (Signed)
Pt called nurse line requesting a refill on her novolog, please advise.

## 2017-09-27 ENCOUNTER — Other Ambulatory Visit: Payer: Self-pay | Admitting: Cardiovascular Disease

## 2017-09-27 ENCOUNTER — Ambulatory Visit (INDEPENDENT_AMBULATORY_CARE_PROVIDER_SITE_OTHER): Payer: Medicare Other | Admitting: Pharmacist Clinician (PhC)/ Clinical Pharmacy Specialist

## 2017-09-27 DIAGNOSIS — Z4502 Encounter for adjustment and management of automatic implantable cardiac defibrillator: Secondary | ICD-10-CM

## 2017-09-27 DIAGNOSIS — I4891 Unspecified atrial fibrillation: Secondary | ICD-10-CM

## 2017-09-27 DIAGNOSIS — Z7901 Long term (current) use of anticoagulants: Secondary | ICD-10-CM | POA: Diagnosis not present

## 2017-09-27 DIAGNOSIS — I480 Paroxysmal atrial fibrillation: Secondary | ICD-10-CM | POA: Diagnosis not present

## 2017-09-27 LAB — POCT INR: INR: 2 (ref 2.0–3.0)

## 2017-09-28 ENCOUNTER — Encounter (HOSPITAL_COMMUNITY)
Admission: RE | Disposition: A | Discharge status: Home / Self Care | Payer: Self-pay | Source: Ambulatory Visit | Attending: Cardiovascular Disease

## 2017-09-28 ENCOUNTER — Ambulatory Visit (HOSPITAL_COMMUNITY)
Admission: RE | Admit: 2017-09-28 | Discharge: 2017-09-28 | Disposition: A | Discharge status: Home / Self Care | Payer: Medicare Other | Source: Ambulatory Visit | Attending: Cardiovascular Disease | Admitting: Cardiovascular Disease

## 2017-09-28 DIAGNOSIS — E785 Hyperlipidemia, unspecified: Secondary | ICD-10-CM | POA: Insufficient documentation

## 2017-09-28 DIAGNOSIS — Z7951 Long term (current) use of inhaled steroids: Secondary | ICD-10-CM | POA: Diagnosis not present

## 2017-09-28 DIAGNOSIS — I472 Ventricular tachycardia, unspecified: Secondary | ICD-10-CM

## 2017-09-28 DIAGNOSIS — E119 Type 2 diabetes mellitus without complications: Secondary | ICD-10-CM | POA: Diagnosis not present

## 2017-09-28 DIAGNOSIS — Z4502 Encounter for adjustment and management of automatic implantable cardiac defibrillator: Secondary | ICD-10-CM | POA: Insufficient documentation

## 2017-09-28 DIAGNOSIS — F419 Anxiety disorder, unspecified: Secondary | ICD-10-CM | POA: Diagnosis not present

## 2017-09-28 DIAGNOSIS — Z794 Long term (current) use of insulin: Secondary | ICD-10-CM | POA: Diagnosis not present

## 2017-09-28 DIAGNOSIS — I480 Paroxysmal atrial fibrillation: Secondary | ICD-10-CM

## 2017-09-28 DIAGNOSIS — I5042 Chronic combined systolic (congestive) and diastolic (congestive) heart failure: Secondary | ICD-10-CM | POA: Insufficient documentation

## 2017-09-28 DIAGNOSIS — I110 Hypertensive heart disease with heart failure: Secondary | ICD-10-CM | POA: Diagnosis not present

## 2017-09-28 DIAGNOSIS — F329 Major depressive disorder, single episode, unspecified: Secondary | ICD-10-CM | POA: Insufficient documentation

## 2017-09-28 DIAGNOSIS — I428 Other cardiomyopathies: Secondary | ICD-10-CM | POA: Insufficient documentation

## 2017-09-28 DIAGNOSIS — Z95810 Presence of automatic (implantable) cardiac defibrillator: Secondary | ICD-10-CM | POA: Diagnosis present

## 2017-09-28 DIAGNOSIS — Z8674 Personal history of sudden cardiac arrest: Secondary | ICD-10-CM | POA: Diagnosis not present

## 2017-09-28 DIAGNOSIS — Z7901 Long term (current) use of anticoagulants: Secondary | ICD-10-CM | POA: Insufficient documentation

## 2017-09-28 DIAGNOSIS — K219 Gastro-esophageal reflux disease without esophagitis: Secondary | ICD-10-CM | POA: Insufficient documentation

## 2017-09-28 DIAGNOSIS — G4730 Sleep apnea, unspecified: Secondary | ICD-10-CM | POA: Diagnosis not present

## 2017-09-28 HISTORY — PX: ICD GENERATOR CHANGEOUT: EP1231

## 2017-09-28 LAB — SURGICAL PCR SCREEN
MRSA, PCR: NEGATIVE
STAPHYLOCOCCUS AUREUS: NEGATIVE

## 2017-09-28 LAB — PROTIME-INR
INR: 2.6
PROTHROMBIN TIME: 27.6 s — AB (ref 11.4–15.2)

## 2017-09-28 LAB — GLUCOSE, CAPILLARY: Glucose-Capillary: 110 mg/dL — ABNORMAL HIGH (ref 65–99)

## 2017-09-28 SURGERY — ICD GENERATOR CHANGEOUT

## 2017-09-28 MED ORDER — SODIUM CHLORIDE 0.9% FLUSH: 3.0000 mL | Freq: Two times a day (BID) | INTRAVENOUS | Status: DC

## 2017-09-28 MED ORDER — MUPIROCIN 2 % EX OINT
1.0000 | TOPICAL_OINTMENT | Freq: Once | CUTANEOUS | Status: AC
Start: 2017-09-28 — End: 2017-09-28
  Administered 2017-09-28: 1 via TOPICAL

## 2017-09-28 MED ORDER — CEFAZOLIN SODIUM-DEXTROSE 2-4 GM/100ML-% IV SOLN
INTRAVENOUS | Status: AC
  Filled 2017-09-28: qty 100

## 2017-09-28 MED ORDER — SODIUM CHLORIDE 0.9% FLUSH: 3.0000 mL | INTRAVENOUS | Status: DC | PRN

## 2017-09-28 MED ORDER — ACETAMINOPHEN 325 MG PO TABS: 325.0000 mg | ORAL_TABLET | ORAL | Status: DC | PRN

## 2017-09-28 MED ORDER — CHLORHEXIDINE GLUCONATE 4 % EX LIQD
60.0000 mL | Freq: Once | CUTANEOUS | Status: DC
  Filled 2017-09-28: qty 60

## 2017-09-28 MED ORDER — MUPIROCIN 2 % EX OINT
TOPICAL_OINTMENT | CUTANEOUS | Status: AC
  Administered 2017-09-28: 1 via TOPICAL
  Filled 2017-09-28: qty 22

## 2017-09-28 MED ORDER — SODIUM CHLORIDE 0.9 % IV SOLN
INTRAVENOUS | Status: DC
  Administered 2017-09-28: 13:00:00 via INTRAVENOUS

## 2017-09-28 MED ORDER — SODIUM CHLORIDE 0.9 % IV SOLN
INTRAVENOUS | Status: AC
  Filled 2017-09-28: qty 2

## 2017-09-28 MED ORDER — LIDOCAINE HCL (PF) 1 % IJ SOLN
INTRAMUSCULAR | Status: DC | PRN
  Administered 2017-09-28: 60 mL

## 2017-09-28 MED ORDER — LIDOCAINE HCL (PF) 1 % IJ SOLN
INTRAMUSCULAR | Status: AC
  Filled 2017-09-28: qty 60

## 2017-09-28 MED ORDER — CEFAZOLIN SODIUM-DEXTROSE 2-4 GM/100ML-% IV SOLN
2.0000 g | INTRAVENOUS | Status: AC
  Administered 2017-09-28: 2 g via INTRAVENOUS

## 2017-09-28 MED ORDER — GENTAMICIN SULFATE 40 MG/ML IJ SOLN
80.0000 mg | INTRAMUSCULAR | Status: AC
  Administered 2017-09-28: 80 mg

## 2017-09-28 MED ORDER — ONDANSETRON HCL 4 MG/2ML IJ SOLN: 4.0000 mg | Freq: Four times a day (QID) | INTRAMUSCULAR | Status: DC | PRN

## 2017-09-28 MED ORDER — SODIUM CHLORIDE 0.9 % IV SOLN: 250.0000 mL | INTRAVENOUS | Status: DC | PRN

## 2017-09-28 SURGICAL SUPPLY — 5 items
CABLE SURGICAL S-101-97-12 (CABLE) ×2 IMPLANT
ICD EVERA XT MRI DF1  DDMB1D1 (ICD Generator) ×1 IMPLANT
ICD EVERA XT MRI DF1 DDMB1D1 (ICD Generator) IMPLANT
PAD DEFIB LIFELINK (PAD) ×2 IMPLANT
TRAY PACEMAKER INSERTION (PACKS) ×2 IMPLANT

## 2017-09-28 NOTE — Discharge Instructions (Signed)
Supplemental Discharge Instructions for  Pacemaker/Defibrillator Patients  Activity DO wear your seatbelt, even if it crosses over the pacemaker site.  WOUND CARE - Keep the wound area clean and dry.  Remove the dressing the day after you return home (usually 48 hours after the procedure). - DO NOT SUBMERGE UNDER WATER UNTIL FULLY HEALED (no tub baths, hot tubs, swimming pools, etc.).  - You  may shower or take a sponge bath after the dressing is removed. DO NOT SOAK the area and do not allow the shower to directly spray on the site. - If you have staples, these will be removed in the office in 7-14 days. - If you have tape/steri-strips on your wound, these will fall off; do not pull them off prematurely.   - No bandage is needed on the site.  DO  NOT apply any creams, oils, or ointments to the wound area. - If you notice any drainage or discharge from the wound, any swelling, excessive redness or bruising at the site, or if you develop a fever > 101? F after you are discharged home, call the office at once.  Special Instructions - You are still able to use cellular telephones.  Avoid carrying your cellular phone near your device. - When traveling through airports, show security personnel your identification card to avoid being screened in the metal detectors.  - Avoid arc welding equipment, MRI testing (magnetic resonance imaging), TENS units (transcutaneous nerve stimulators).  Call the office for questions about other devices. - Avoid electrical appliances that are in poor condition or are not properly grounded. - Microwave ovens are safe to be near or to operate.  Additional information for defibrillator patients should your device go off: - If your device goes off ONCE and you feel fine afterward, notify the clinic at [phone]. - If your device goes off ONCE and you do not feel well afterward, call 911. - If your device goes off TWICE or more in one day, call 911.  DO NOT DRIVE  YOURSELF OR A FAMILY MEMBER WITH A DEFIBRILLATOR TO THE HOSPITAL--CALL 911.                Pacemaker Battery Change, Care After This sheet gives you information about how to care for yourself after your procedure. Your health care provider may also give you more specific instructions. If you have problems or questions, contact your health care provider. What can I expect after the procedure? After your procedure, it is common to have:  Pain or soreness at the site where the pacemaker was inserted.  Swelling at the site where the pacemaker was inserted.  Follow these instructions at home: Incision care  Keep the incision clean and dry. ? Do not take baths, swim, or use a hot tub until your health care provider approves. ? You may shower the day after your procedure, or as directed by your health care provider. ? Pat the area dry with a clean towel. Do not rub the area. This may cause bleeding.  Follow instructions from your health care provider about how to take care of your incision. Make sure you: ? Wash your hands with soap and water before you change your bandage (dressing). If soap and water are not available, use hand sanitizer. ? Change your dressing as told by your health care provider. ? Leave stitches (sutures), skin glue, or adhesive strips in place. These skin closures may need to stay in place for 2 weeks or longer. If adhesive  strip edges start to loosen and curl up, you may trim the loose edges. Do not remove adhesive strips completely unless your health care provider tells you to do that.  Check your incision area every day for signs of infection. Check for: ? More redness, swelling, or pain. ? More fluid or blood. ? Warmth. ? Pus or a bad smell. Activity  Do not lift anything that is heavier than 10 lb (4.5 kg) until your health care provider says it is okay to do so.  For the first 2 weeks, or as long as told by your health care provider: ? Avoid  lifting your left arm higher than your shoulder. ? Be gentle when you move your arms over your head. It is okay to raise your arm to comb your hair. ? Avoid strenuous exercise.  Ask your health care provider when it is okay to: ? Resume your normal activities. ? Return to work or school. ? Resume sexual activity. Eating and drinking  Eat a heart-healthy diet. This should include plenty of fresh fruits and vegetables, whole grains, low-fat dairy products, and lean protein like chicken and fish.  Limit alcohol intake to no more than 1 drink a day for non-pregnant women and 2 drinks a day for men. One drink equals 12 oz of beer, 5 oz of wine, or 1 oz of hard liquor.  Check ingredients and nutrition facts on packaged foods and beverages. Avoid the following types of food: ? Food that is high in salt (sodium). ? Food that is high in saturated fat, like full-fat dairy or red meat. ? Food that is high in trans fat, like fried food. ? Food and drinks that are high in sugar. Lifestyle  Do not use any products that contain nicotine or tobacco, such as cigarettes and e-cigarettes. If you need help quitting, ask your health care provider.  Take steps to manage and control your weight.  Get regular exercise. Aim for 150 minutes of moderate-intensity exercise (such as walking or yoga) or 75 minutes of vigorous exercise (such as running or swimming) each week.  Manage other health problems, such as diabetes or high blood pressure. Ask your health care provider how you can manage these conditions. General instructions  Do not drive for 24 hours after your procedure if you were given a medicine to help you relax (sedative).  Take over-the-counter and prescription medicines only as told by your health care provider.  Avoid putting pressure on the area where the pacemaker was placed.  If you need an MRI after your pacemaker has been placed, be sure to tell the health care provider who orders the  MRI that you have a pacemaker.  Avoid close and prolonged exposure to electrical devices that have strong magnetic fields. These include: ? Cell phones. Avoid keeping them in a pocket near the pacemaker, and try using the ear opposite the pacemaker. ? MP3 players. ? Household appliances, like microwaves. ? Metal detectors. ? Electric generators. ? High-tension wires.  Keep all follow-up visits as directed by your health care provider. This is important. Contact a health care provider if:  You have pain at the incision site that is not relieved by over-the-counter or prescription medicines.  You have any of these around your incision site or coming from it: ? More redness, swelling, or pain. ? Fluid or blood. ? Warmth to the touch. ? Pus or a bad smell.  You have a fever.  You feel brief, occasional palpitations, light-headedness, or  any symptoms that you think might be related to your heart. Get help right away if:  You experience chest pain that is different from the pain at the pacemaker site.  You develop a red streak that extends above or below the incision site.  You experience shortness of breath.  You have palpitations or an irregular heartbeat.  You have light-headedness that does not go away quickly.  You faint or have dizzy spells.  Your pulse suddenly drops or increases rapidly and does not return to normal.  You begin to gain weight and your legs and ankles swell. Summary  After your procedure, it is common to have pain, soreness, and some swelling where the pacemaker was inserted.  Make sure to keep your incision clean and dry. Follow instructions from your health care provider about how to take care of your incision.  Check your incision every day for signs of infection, such as more pain or swelling, pus or a bad smell, warmth, or leaking fluid and blood.  Avoid strenuous exercise and lifting your left arm higher than your shoulder for 2 weeks, or as  long as told by your health care provider. This information is not intended to replace advice given to you by your health care provider. Make sure you discuss any questions you have with your health care provider. Document Released: 01/18/2013 Document Revised: 02/20/2016 Document Reviewed: 02/20/2016 Elsevier Interactive Patient Education  2017 [employer].

## 2017-09-28 NOTE — Op Note (Signed)
Procedure report  Procedure performed:  Dual chamber ICD generator changeout   Reason for procedure:  1. Device generator at elective replacement interval  2. History of polymorphic VT/secondary prevention Procedure performed by:  Thurmon Fair, MD  Complications:  None  Estimated blood loss:  <5 mL  Medications administered during procedure:  Ancef 2 g intravenously, lidocaine 1% 30 mL locally Device details:   New Generator Medtronic Evera XT model number [id number], serial number [id number] Right atrial lead (chronic) Medtronic [id number],  serial number [id number],(AGTXMIWOE 05/02/2010) Right ventricular lead (chronic)  Medtronic [id number], serial number [id number] (implanted 05/02/2010)  Explanted generator Medtronic Protecta XT, serial number  [id number] (implanted 05/02/2010)  Procedure details:  After the risks and benefits of the procedure were discussed the patient provided informed consent. She was brought to the cardiac catheter lab in the fasting state. The patient was prepped and draped in usual sterile fashion. Local anesthesia with 1% lidocaine was administered to to the left infraclavicular area. A 5-6cm horizontal incision was made parallel with and 2-3 cm caudal to the left clavicle, in the area of an old scar. An older scar was seen closer to the left clavicle. Using minimal electrocautery and mostly sharp and blunt dissection the prepectoral pocket was opened carefully to avoid injury to the loops of chronic leads. Extensive dissection was not necessary. The device was explanted. The pocket was carefully inspected for hemostasis and flushed with copious amounts of antibiotic solution.  The leads were disconnected from the old generator and testing of the lead parameters via telemetry showed excellent values. The new generator was connected to the chronic leads, with appropriate pacing noted.   The entire system was then carefully inserted in the pocket with care been taking  that the leads and device assumed a comfortable position without pressure on the incision. Great care was taken that the leads be located deep to the generator. The pocket was then closed in layers using 2 layers of 2-0 Vicryl and cutaneous staples after which a sterile dressing was applied.   At the end of the procedure the following lead parameters were encountered:   Right atrial lead sensed P waves 0.5-0.8 mV, impedance 485 ohms, threshold 0.5 at 0.4 ms pulse width.  Right ventricular lead sensed R waves  12.9 mV, impedance 437 ohms, threshold 0.5 at 0.4 ms pulse width.  Thurmon Fair, MD, [facility] [phone] office [phone] pager

## 2017-09-28 NOTE — H&P (Signed)
Cardiology Admission History and Physical:   Patient ID: [name]; MRN: [id number]; DOB: Dec 25, 1950   Admission date: 09/28/2017  Primary Care Provider: Garth Bigness, MD Primary Cardiologist:Zeppelin Beckstrand   Chief Complaint: ICD battery depletion  Patient Profile:   [name] is a 67 y.o. female with a history of long-standing nonischemic cardiomyopathy, well compensated chronic systolic and diastolic heart failure and paroxysmal atrial fibrillation, history of previous antitachycardia pacing for ventricular tachycardia in December 2016.  She presents today for defibrillator generator change out.  History of Present Illness:   [name] is here for elective replacement of her ICD generator.  Since her last appointment she has not had defibrillator discharges, syncope or any meaningful change in her pattern of her arrhythmia.  She has occasional brief nonsustained VT.  She has a history of atrial fibrillation and has undergone 2 previous ablation procedures (Dr. Orson Aloe).  Current episodes of breakthrough atrial fibrillation on dofetilide quite infrequent, roughly 1% burden.  When not in atrial fibrillation she is almost 100% atrial paced.  She rarely requires ventricular pacing.  Reached RRT in May.  Her defibrillator was implanted in January 2012 after an episode of ventricular fibrillation arrest during treatment with dofetilide and excessive QT prolongation possibly due to simultaneous administration of a quinolone.  Her current device is a Medtronic protecta dual chamber device, her ventricular lead is a Cabin crew secure S.  Antitachycardia therapies have only been activated once, in December 2016 when she had a "dirty break" after ATP an episode of polymorphic VT lasting 2 seconds was recorded about 2 years ago during an episode of asthma exacerbation, but terminated spontaneously before therapy was delivered.  Most recent echo in September 2017 showed improvement in  left ventricular ejection fraction at 50%.  Letter therapy for secondary prevention.   Past Medical History:  Diagnosis Date  . AICD (automatic cardioverter/defibrillator) present    Medtronic  . Anemia   . Anxiety   . Arthritis   . Asthma    has had multiple hospitalizations for this  . Atrial fibrillation (HCC)    ablation x 2 [facility], 01/2006, 2011.  on warfarin  . Cardiac arrest [facility]) jan 2012   in hospital for pneumonia when this occured- occured at the hospital  . CHF (congestive heart failure) (HCC) 2012   Echo 08/08/10 by [facility]. EF 35-45%. LV systolic function moderately reduced. Moderate global hypokinesis of LV.  RV systolic function moderately reduced. Mild MR. Trace AR.  Marland Kitchen Complication of anesthesia    difficult time waking up after anesthesia  . Depression   . Diabetes mellitus    Type 2  . Dysrhythmia    Atrial fibrillation  . GASTROESOPHAGEAL REFLUX, NO ESOPHAGITIS 06/10/2006   Qualifier: Diagnosis of  By: Abundio Miu    . GERD (gastroesophageal reflux disease)   . History of hiatal hernia   . Hyperlipidemia   . Hypertension   . Limb pain 06/04/2008   LLE, Baker's cyst in popliteal fossa, no DVT  . Non-ischemic cardiomyopathy (HCC)    echo 08/08/10 - EF 35-45% LV and RV systolic function mod reduced  . Primary localized osteoarthritis of left knee   . Primary localized osteoarthritis of right knee   . Primary localized osteoarthritis of right knee   . Sleep apnea    wears CPAP nightly  . Vocal cord disease   . Wears glasses     Past Surgical History:  Procedure Laterality Date  . BREAST  EXCISIONAL BIOPSY Right 1999  . BREAST LUMPECTOMY Right   . CARDIAC DEFIBRILLATOR PLACEMENT  05/02/10   Medtronic Protecta XT-DR for CHF-VT, last download 04/12/12  . CHOLECYSTECTOMY    . COLONOSCOPY    . HAMMER TOE SURGERY Right   . KNEE ARTHROSCOPY Right   . PAF Ablation     By Dr Sampson Goon. Now sees Dr Steele Berg [facility]  . TOTAL KNEE ARTHROPLASTY  Left 09/17/2014  . TOTAL KNEE ARTHROPLASTY Left 09/17/2014   Procedure: TOTAL KNEE ARTHROPLASTY;  Surgeon: Salvatore Marvel, MD;  [facility]: [facility];  Service: Orthopedics;  Laterality: Left;  pt has ICD  . TUBAL LIGATION       Medications Prior to Admission: Prior to Admission medications   Medication Sig Start Date End Date Taking? Authorizing Provider  acetaminophen (TYLENOL) 325 MG tablet Take 650 mg by mouth every 6 (six) hours as needed for moderate pain or headache.   Yes [provider]  bisoprolol (ZEBETA) 5 MG tablet take 2 tablets by mouth once daily Patient taking differently: Take 10 mg by mouth once daily 12/28/16  Yes Estee Yohe, MD  budesonide-formoterol (SYMBICORT) 160-4.5 MCG/ACT inhaler Inhale 2 puffs into the lungs 2 (two) times daily. 12/31/16  Yes Roslynn Amble, MD  buPROPion (WELLBUTRIN XL) 150 MG 24 hr tablet take 1 tablet by mouth once daily Patient taking differently: Take 150 mg by mouth once daily 03/15/17  Yes Wendee Beavers, DO  chlorpheniramine (CHLOR-TRIMETON) 4 MG tablet 8mg  at night Patient taking differently: Take 8 mg by mouth at bedtime as needed for allergies.  10/17/12  Yes Storm Frisk, MD  dofetilide (TIKOSYN) 250 MCG capsule Take 1 capsule (250 mcg total) by mouth 2 (two) times daily. 06/19/16  Yes Helaina Stefano, MD  fluticasone (FLONASE) 50 MCG/ACT nasal spray instill 1 spray into each nostril twice a day Patient taking differently: Place 1 spray into both nostrils 2 (two) times daily as needed for allergies.  12/30/16  Yes Roslynn Amble, MD  Insulin Glargine (BASAGLAR KWIKPEN) 100 UNIT/ML SOPN Inject 0.57 mLs (57 Units total) into the skin at bedtime. 07/08/17  Yes Garth Bigness, MD  levalbuterol [facility] HFA) 45 MCG/ACT inhaler INHALE 1 TO 2 PUFFS BY MOUTH INTO THE LUNGS EVERY 4 HOURS IF NEEDED FOR WHEEZING 12/30/16  Yes Roslynn Amble, MD  levalbuterol Pauline Aus) 0.63 MG/3ML nebulizer solution One vial in nebulizer four times  daily as needed Patient taking differently: Take 0.63 mg by nebulization 4 (four) times daily as needed for wheezing or shortness of breath.  06/04/17  Yes Parrett, Tammy S, NP  montelukast (SINGULAIR) 10 MG tablet TAKE 1 TABLET BY MOUTH AT BEDTIME 08/10/17  Yes Oretha Milch, MD  polyethylene glycol powder (GLYCOLAX/MIRALAX) powder take 17GM (DISSOLVED IN WATER) by mouth once daily THREE TIMES A WEEK if needed as directed Patient taking differently: Mix 17 g in liquid and drink 3 times weekly 01/21/16  Yes Rumley, [place] N, DO  potassium chloride SA (K-DUR,KLOR-CON) 20 MEQ tablet Take 1 tablet (20 mEq total) by mouth daily as directed. Patient taking differently: Take 20 mEq by mouth daily as needed (with Torsemide).  02/18/16  Yes Geralda Baumgardner, MD  ranitidine (ZANTAC) 150 MG tablet Take 1 tablet (150 mg total) by mouth daily. 07/12/17  Yes Parrett, Tammy S, NP  rosuvastatin (CRESTOR) 10 MG tablet Take 1 tablet (10 mg total) by mouth daily. 09/20/17  Yes Runell Gess, MD  torsemide (DEMADEX) 20 MG tablet Take 1  tablet (20 mg total) by mouth twice daily as directed. Patient taking differently: Take 20 mg by mouth daily.  01/25/17  Yes Samiyyah Moffa, MD  warfarin (COUMADIN) 5 MG tablet take 1 to 1.5 tablets by mouth once daily as directed BY COUMADIN CLINIC Patient taking differently: Take 5-7.5 mg by mouth See admin instructions. Take 7.5 mg by mouth daily on Sunday. Take 5 mg by mouth daily on all other days 04/19/17  Yes Anny Sayler, MD  glucose blood test strip Use as instructed Patient not taking: Reported on 09/21/2017 08/03/17   Garth Bigness, MD  insulin aspart (NOVOLOG FLEXPEN) 100 UNIT/ML FlexPen Inject 5-7 Units into the skin See admin instructions. Inject 5 units SQ before breakfast and inject 7 units SQ before dinner 09/22/17   Garth Bigness, MD  Insulin Pen Needle (B-D ULTRAFINE III SHORT PEN) 31G X 8 MM MISC Use as directed up to 4 times daily to check blood  sugar Patient not taking: Reported on 09/21/2017 07/08/17   Garth Bigness, MD  mometasone-formoterol [facility]) 200-5 MCG/ACT AERO Inhale 2 puffs into the lungs 2 (two) times daily. Patient not taking: Reported on 09/21/2017 05/04/17   Parrett, Virgel Bouquet, NP  PRODIGY LANCETS 28G MISC 1 Units by Does not apply route 4 (four) times daily. Patient not taking: Reported on 09/21/2017 08/03/17   Garth Bigness, MD     Allergies:    Allergies  Allergen Reactions  . Avelox [Moxifloxacin Hcl In Nacl] Other (See Comments)    Cardiac arrest per pt  . Simvastatin Other (See Comments)    myalgias  . Ace Inhibitors Other (See Comments) and Cough    Dry cough   . Latex Rash    Social History:   Social History   Socioeconomic History  . Marital status: Widowed    Spouse name: Not on file  . Number of children: Not on file  . Years of education: Not on file  . Highest education level: Not on file  Occupational History  . Not on file  Social Needs  . Financial resource strain: Not on file  . Food insecurity:    Worry: Not on file    Inability: Not on file  . Transportation needs:    Medical: Not on file    Non-medical: Not on file  Tobacco Use  . Smoking status: Never Smoker  . Smokeless tobacco: Never Used  Substance and Sexual Activity  . Alcohol use: No  . Drug use: No  . Sexual activity: Never  Lifestyle  . Physical activity:    Days per week: Not on file    Minutes per session: Not on file  . Stress: Not on file  Relationships  . Social connections:    Talks on phone: Not on file    Gets together: Not on file    Attends religious service: Not on file    Active member of club or organization: Not on file    Attends meetings of clubs or organizations: Not on file    Relationship status: Not on file  . Intimate partner violence:    Fear of current or ex partner: Not on file    Emotionally abused: Not on file    Physically abused: Not on file    Forced sexual activity:  Not on file  Other Topics Concern  . Not on file  Social History Narrative   Not employed   Exercise- walking 1 hour daily.    Diet- eating healthy diet.  [facility]:   She is originally from [place]. Has always lived in [place]. 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 Currently has a small dog. No bird or mold exposure.       Current Social History 04/08/2017        Patient lives with daughter, Lafonda Mosses, in two level home 04/08/2017   Transportation: Patient has own vehicle and drives herself 04/08/2017   Important Relationships Daughter, Lafonda Mosses 04/08/2017    Pets: Shiatzu Evaristo Bury)  04/08/2017   Education / Work:  10 th grade/ None 04/08/2017   Interests / Fun: Read, do puzzles 04/08/2017   Current Stressors: None because she prays to God 04/08/2017   Religious / Personal Beliefs: Non-Denominational 04/08/2017   L. Ducatte, RN, BSN                                                                                                  Family History:   The patient's family history includes Diabetes in her brother and father; Hypertension in her brother and father. There is no history of Lung disease, Cancer, or Rheumatologic disease.    ROS:  Please see the history of present illness.  The patient specifically denies any chest pain at rest exertion, dyspnea at rest or with exertion, orthopnea, paroxysmal nocturnal dyspnea, syncope, palpitations, focal neurological deficits, intermittent claudication, lower extremity edema, unexplained weight gain, cough, hemoptysis or wheezing.  All other ROS reviewed and negative.     Physical Exam/Data:   Vitals:   09/28/17 1113  BP: (!) 148/94  Pulse: 91  Temp: 97.8 F (36.6 C)  TempSrc: Oral  SpO2: 99%  Weight: 212 lb (96.2 kg)  Height: 5\' 6"  (1.676 m)   No intake or output data in the 24 hours ending 09/28/17 1117 Filed Weights    09/28/17 1113  Weight: 212 lb (96.2 kg)   Body mass index is 34.22 kg/m.  General:  Well nourished, well developed, in no acute distress HEENT: normal Lymph: no adenopathy Neck: no JVD Endocrine:  No thryomegaly Vascular: No carotid bruits; FA pulses 2+ bilaterally without bruits  Cardiac:  normal S1, S2; RRR; no murmur  Lungs:  clear to auscultation bilaterally, no wheezing, rhonchi or rales  Abd: soft, nontender, no hepatomegaly  Ext: no edema Musculoskeletal:  No deformities, BUE and BLE strength normal and equal Skin: warm and dry  Neuro:  CNs 2-12 intact, no focal abnormalities noted Psych:  Normal affect    EKG:  The ECG that was done today was personally reviewed and demonstrates A paced V sensed rhythm, long AV delay 220 ms. QTc 486 ms  Assessment and Plan:   1. Mrs. Landrigan is here for elective replacement of her defibrillator generator.  Lead parameters prior to the procedure remain acceptable. This procedure has been fully reviewed with the patient and written informed consent has been obtained.   For questions or updates, please contact [facility] Please consult [email or web address] for contact info under Cardiology/STEMI.    Signed, Caitlen Worth,  MD  09/28/2017 11:17 AM

## 2017-09-28 NOTE — Progress Notes (Signed)
Assumed care of pt from DeJanet Lantion, RN. Assessment documented. 

## 2017-09-29 ENCOUNTER — Encounter (HOSPITAL_COMMUNITY): Payer: Self-pay | Admitting: Cardiovascular Disease

## 2017-09-30 ENCOUNTER — Encounter: Payer: Self-pay | Admitting: Pharmacist

## 2017-09-30 ENCOUNTER — Ambulatory Visit (INDEPENDENT_AMBULATORY_CARE_PROVIDER_SITE_OTHER): Payer: Medicare Other | Admitting: Pharmacist

## 2017-09-30 DIAGNOSIS — J4540 Moderate persistent asthma, uncomplicated: Secondary | ICD-10-CM

## 2017-09-30 DIAGNOSIS — E7800 Pure hypercholesterolemia, unspecified: Secondary | ICD-10-CM

## 2017-09-30 DIAGNOSIS — E1165 Type 2 diabetes mellitus with hyperglycemia: Secondary | ICD-10-CM

## 2017-09-30 DIAGNOSIS — E1140 Type 2 diabetes mellitus with diabetic neuropathy, unspecified: Secondary | ICD-10-CM

## 2017-09-30 DIAGNOSIS — IMO0002 Reserved for concepts with insufficient information to code with codable children: Secondary | ICD-10-CM

## 2017-09-30 MED ORDER — BUDESONIDE-FORMOTEROL FUMARATE 160-4.5 MCG/ACT IN AERO: 2.0000 | INHALATION_SPRAY | Freq: Two times a day (BID) | RESPIRATORY_TRACT | 3 refills | Status: DC | PRN

## 2017-09-30 MED ORDER — INSULIN ASPART 100 UNIT/ML FLEXPEN: 5.0000 [IU] | PEN_INJECTOR | SUBCUTANEOUS | 3 refills | Status: DC

## 2017-09-30 MED ORDER — BASAGLAR KWIKPEN 100 UNIT/ML ~~LOC~~ SOPN: 40.0000 [IU] | PEN_INJECTOR | Freq: Every day | SUBCUTANEOUS | 11 refills | Status: DC

## 2017-09-30 MED ORDER — EMPAGLIFLOZIN 10 MG PO TABS: 10.0000 mg | ORAL_TABLET | Freq: Every day | ORAL | 2 refills | Status: DC

## 2017-09-30 NOTE — Patient Instructions (Addendum)
Nice to meet you!   Change basaglar to 40 units in the morning  Change novolog to 5 units with breakfast and supper  Start Jardiance 10 mg daily in the morning. Do not take this medicine if you are sick/dehydrated/throwing up.   Stop the [facility], just use Symbicort going forward. These are doing the same thing.   Come back to see [place] in 2-3 weeks and bring your blood sugar meter. Keep taking your blood sugar first thing in the morning and change to taking it after supper around bedtime.

## 2017-09-30 NOTE — Assessment & Plan Note (Signed)
Diabetes longstanding currently uncontrolled per A1C and CBG readings with pattern of hypoglycemia in the evenings after supper. Patient is able to verbalize appropriate hypoglycemia management plan. Patient is adherent with medication. Control is suboptimal due to sensitivity to rapid acting insulin. Patient with concomitant CHF would benefit from SGLT2 inhibitor therapy.  -Adjusted dose of basal insulin Basaglar (insulin glargine) to 40 units and changed administration time to AM dosing (peak concentration more optimally timed for AM hyperglycemia).  -Adjusted dose of bolus insulin Novolog (insulin aspart) to 5 units with breakfast and supper. -Started SGLT2-I Jardiance (empagliflozin) 10 mg daily. Counseled on sick day rules for prevention of euglycemic DKA. Patient educated on purpose, proper use and potential adverse effects of UTI/yeast infections and hypoglycemia.  Following instruction patient verbalized understanding of treatment plan.  -Extensively discussed pathophysiology of DM, recommended lifestyle interventions, dietary effects on glycemic control -Counseled on s/sx of and management of hypoglycemia -Next A1C anticipated 12/09/17 or later

## 2017-09-30 NOTE — Assessment & Plan Note (Signed)
Asthma - well controlled per patient using PRN ICS/LABA and PRN SABA. Duplicate therapy noted with [facility] and Symbicort. Recommended patient d/c Dulera and continue on formulary Symbicort and Xopenex PRN + montelukast 10 mg daily as long as sx are controlled. Counseled patient to rinse out mouth after use of Symbicort.

## 2017-09-30 NOTE — Progress Notes (Signed)
S:     Chief Complaint  Patient presents with  . Medication Management    Diabetes    Patient arrives in good spirits, ambulating without assistance.  Presents for diabetes evaluation, education, and management at the request of Chanetta Marshall . Patient was referred on 09/08/17.  Patient was last seen by Primary Care Provider on 09/08/17. Of note, patient was recently admitted for ICD change.   Patient states she doesn't understand why CBGs are uncontrolled because she considers her diet and exercise habits to be healthy. Reports meter is ~66 year old. Tests CBGs before breakfast and supper. Reports breathing has been overall fine, last used rescue inhaler 1 month ago. Using either Symbicort or Dulera PRN.   Family/Social History: father and maternal aunts with diabetes  Insurance coverage/medication affordability: [employer] Medicare Part D. Appears that patient has LIS/Extra Help with reported copays of $3 or $8.   Patient reports adherence with medications. Current diabetes medications include: Basaglar 57 units at bedtime, Novolog 5 units with breakfast and 7 units with supper.   Patient reports hypoglycemic events. After supper (6-9PM) is typical. Rarely wakes up with hypoglycemia.   Patient reported dietary habits: Eats 2-3 meals/day Breakfast:1 cup of oatmeal (slow cook) + 1 tsp sugar and cinnamon with fruit Lunch:Turkey sandwich with french fries Dinner:Spinach + corn bread muffin + [place] sausage Snacks: none Drinks: water  Patient-reported exercise habits: walks ~30 minutes nearly every day   Patient reports nocturia. x1 nightly.  Patient denies neuropathy. Patient denies visual changes. Patient reports self foot exams. Denies issues, see podiatrists.  Denies frequent UTIs/yeast infections.  Patient was recently discharged from hospital and all medications have been reviewed.  O:  Physical Exam  Constitutional: She appears well-developed and well-nourished.      Review of Systems  HENT: Negative for nosebleeds.   Eyes: Negative for blurred vision.  Respiratory: Negative for shortness of breath and wheezing.   Genitourinary: Negative for dysuria, frequency and urgency.  Neurological: Negative for tingling.  Endo/Heme/Allergies: Does not bruise/bleed easily.  All other systems reviewed and are negative.    Lab Results  Component Value Date   HGBA1C 9.3 (A) 09/08/2017   Vitals:   09/30/17 0921  BP: 126/74  Pulse: 80  SpO2: 99%    Lipid Panel     Component Value Date/Time   CHOL 106 04/21/2016 1438   TRIG 73 04/21/2016 1438   HDL 44 (L) 04/21/2016 1438   CHOLHDL 2.4 04/21/2016 1438   VLDL 15 04/21/2016 1438   LDLCALC 47 04/21/2016 1438   LDLDIRECT 94 12/07/2007 2019    Home fasting CBG: 144 this morning, 120-160s 2 hour post-prandial/random CBG: 90s-250s, excursions to 76 and 300s 7-day average is 210 14-day average is 210 30-day average is 210  Clinical ASCVD: No  ASCVD risk factors : age 77-75 10 year ASCVD risk: 13% A/P: Diabetes longstanding currently uncontrolled per A1C and CBG readings with pattern of hypoglycemia in the evenings after supper. Patient is able to verbalize appropriate hypoglycemia management plan. Patient is adherent with medication. Control is suboptimal due to sensitivity to rapid acting insulin. Patient with concomitant CHF would benefit from SGLT2 inhibitor therapy.  -Adjusted dose of basal insulin Basaglar (insulin glargine) to 40 units and changed administration time to AM dosing (peak concentration more optimally timed for AM hyperglycemia).  -Adjusted dose of bolus insulin Novolog (insulin aspart) to 5 units with breakfast and supper. -Started SGLT2-I Jardiance (empagliflozin) 10 mg daily. Counseled on sick day rules  for prevention of euglycemic DKA. Patient educated on purpose, proper use and potential adverse effects of UTI/yeast infections and hypoglycemia.  Following instruction patient  verbalized understanding of treatment plan.  -Extensively discussed pathophysiology of DM, recommended lifestyle interventions, dietary effects on glycemic control -Counseled on s/sx of and management of hypoglycemia -Next A1C anticipated 12/09/17 or later  ASCVD risk - primary prevention in patient with DM. Last LDL is controlled. ASCVD risk score is not >20%  - moderate intensity statin indicated. Aspirin is not indicated.  -Continued rosuvastatin 10 mg.   Asthma - well controlled per patient using PRN ICS/LABA and PRN SABA. Duplicate therapy noted with [facility] and Symbicort. Recommended patient d/c Dulera and continue on formulary Symbicort and Xopenex PRN + montelukast 10 mg daily as long as sx are controlled. Counseled patient to rinse out mouth after use of Symbicort.    Written patient instructions provided.  Total time in face to face counseling 30 minutes.   Follow up Pharmacist Clinic Visit in 2-3 weeks.   Patient seen with Donnella Bi, PharmD, PGY1 Pharmacy Resident and Devota Pace, PharmD, BCPS, PGY2 Pharmacy Resident.

## 2017-09-30 NOTE — Progress Notes (Signed)
Patient ID: [name], female   DOB: 06-Oct-1950, 67 y.o.   MRN: [id number] Reviewed: Agree with Dr. Macky Lower documentation and management.

## 2017-09-30 NOTE — Assessment & Plan Note (Signed)
ASCVD risk - primary prevention in patient with DM. Last LDL is controlled. ASCVD risk score is not >20%  - moderate intensity statin indicated. Aspirin is not indicated.  -Continued rosuvastatin 10 mg.

## 2017-10-04 ENCOUNTER — Telehealth: Payer: Self-pay | Admitting: *Deleted

## 2017-10-04 NOTE — Telephone Encounter (Signed)
Pt calls to let Dr. Raymondo Band and Dr. Chanetta Marshall know that is stopped taking the jardiance.  It was making her " have trouble breathing and nausea".  Fleeger, Maryjo Rochester, CMA

## 2017-10-08 MED ORDER — BASAGLAR KWIKPEN 100 UNIT/ML ~~LOC~~ SOPN: 57.0000 [IU] | PEN_INJECTOR | Freq: Every day | SUBCUTANEOUS | Status: DC

## 2017-10-08 NOTE — Addendum Note (Signed)
Addended by: Kathrin Ruddy on: 10/08/2017 03:56 PM   Modules accepted: Orders

## 2017-10-08 NOTE — Telephone Encounter (Signed)
Intolerance to Jardiance reported  Stopped taking Jardiance   Novolog 5 units twice daily.  Lantus increased to 57  Units QPM by patient.   She reports that her blood sugars are routinely in the low 100s with increase in Lantus from 40 to 57 units.   Advised to continue and apologized for prolonged time to return her phone call request.

## 2017-10-11 ENCOUNTER — Ambulatory Visit (INDEPENDENT_AMBULATORY_CARE_PROVIDER_SITE_OTHER): Payer: Medicare Other | Admitting: *Deleted

## 2017-10-11 DIAGNOSIS — I472 Ventricular tachycardia, unspecified: Secondary | ICD-10-CM

## 2017-10-11 LAB — CUP PACEART INCLINIC DEVICE CHECK
Battery Remaining Longevity: 114 mo
Brady Statistic AP VS Percent: 96.09 %
Brady Statistic AS VP Percent: 0.03 %
Brady Statistic AS VS Percent: 3.1 %
Brady Statistic RV Percent Paced: 0.85 %
HIGH POWER IMPEDANCE MEASURED VALUE: 70 Ohm
Implantable Lead Implant Date: 20120120
Implantable Lead Location: 753859
Implantable Lead Model: 5076
Implantable Lead Model: 6935
Lead Channel Impedance Value: 456 Ohm
Lead Channel Pacing Threshold Amplitude: 0.5 V
Lead Channel Pacing Threshold Amplitude: 0.75 V
Lead Channel Pacing Threshold Pulse Width: 0.4 ms
Lead Channel Sensing Intrinsic Amplitude: 0.5 mV
Lead Channel Sensing Intrinsic Amplitude: 1.625 mV
Lead Channel Sensing Intrinsic Amplitude: 13.625 mV
Lead Channel Setting Pacing Amplitude: 2.5 V
Lead Channel Setting Pacing Pulse Width: 0.4 ms
MDC IDC LEAD IMPLANT DT: 20120120
MDC IDC LEAD LOCATION: 753860
MDC IDC MSMT BATTERY VOLTAGE: 3.12 V
MDC IDC MSMT LEADCHNL RA PACING THRESHOLD PULSEWIDTH: 0.4 ms
MDC IDC MSMT LEADCHNL RV IMPEDANCE VALUE: 399 Ohm
MDC IDC MSMT LEADCHNL RV IMPEDANCE VALUE: 456 Ohm
MDC IDC MSMT LEADCHNL RV SENSING INTR AMPL: 15.875 mV
MDC IDC PG IMPLANT DT: 20190618
MDC IDC SESS DTM: 20190701112245
MDC IDC SET LEADCHNL RA PACING AMPLITUDE: 2 V
MDC IDC SET LEADCHNL RV SENSING SENSITIVITY: 0.3 mV
MDC IDC STAT BRADY AP VP PERCENT: 0.79 %
MDC IDC STAT BRADY RA PERCENT PACED: 94.91 %

## 2017-10-11 NOTE — Progress Notes (Signed)
Wound check appointment. Staples removed. Wound without redness or edema. Incision edges approximated, wound well healed. Normal device function. Thresholds, sensing, and impedances consistent with implant measurements. Device programmed at chronic value outputs. Histogram distribution appropriate for patient and level of activity. 1.1% AT/AF + Coumadin. No ventricular arrhythmias noted. Patient educated about wound care, and shock plan. ROV 01/05/2018 w/ [facility]

## 2017-10-21 ENCOUNTER — Encounter: Payer: Self-pay | Admitting: Pharmacist

## 2017-10-21 ENCOUNTER — Ambulatory Visit (INDEPENDENT_AMBULATORY_CARE_PROVIDER_SITE_OTHER): Payer: Medicare Other | Admitting: Pharmacist

## 2017-10-21 VITALS — BP 126/72 | HR 90 | Ht 66.0 in | Wt 213.0 lb

## 2017-10-21 DIAGNOSIS — E1140 Type 2 diabetes mellitus with diabetic neuropathy, unspecified: Secondary | ICD-10-CM | POA: Diagnosis not present

## 2017-10-21 DIAGNOSIS — E1165 Type 2 diabetes mellitus with hyperglycemia: Secondary | ICD-10-CM

## 2017-10-21 DIAGNOSIS — IMO0002 Reserved for concepts with insufficient information to code with codable children: Secondary | ICD-10-CM

## 2017-10-21 MED ORDER — BASAGLAR KWIKPEN 100 UNIT/ML ~~LOC~~ SOPN: 50.0000 [IU] | PEN_INJECTOR | Freq: Every day | SUBCUTANEOUS | Status: DC

## 2017-10-21 MED ORDER — LIRAGLUTIDE 18 MG/3ML ~~LOC~~ SOPN: 0.6000 mg | PEN_INJECTOR | Freq: Every day | SUBCUTANEOUS | 0 refills | Status: DC

## 2017-10-21 NOTE — Patient Instructions (Addendum)
Good to see you.   Start Victoza on the dosing schedule below:  Week 1 - 0.6 mg once a day Week 2 - 1.2 mg once a day Week 3 and beyond - 1.8 mg once a day  Decrease your Basaglar to 50 units once a day. Continue novolog 5 units with breakfast and supper.   Come back to see [place] in 4 weeks.

## 2017-10-21 NOTE — Assessment & Plan Note (Signed)
Diabetes longstanding currently improved but still uncontrolled. Patient is able to verbalize appropriate hypoglycemia management plan. Patient is adherent with medication. Control is suboptimal due to intolerance to jardiance, unoptimized insulin dosing strategy.  -Decreased dose of basal insulin Basaglar (insulin glargine) to 50 units once daily. Continued dose of prandial insulin Novolog (insulin aspart) at 5 units with breakfast and supper -Started GLP-1 Victoza (liraglutide) 0.6 mg daily x1 week, then 1.2 mg daily x1 week, then 1.8 mg daily thereafter. Sample provided. Patient educated on purpose, proper use and potential adverse effects of n/v and weight loss.  Following instruction patient verbalized understanding of treatment plan. Discussed once daily vs once weekly GLP therapy options, patient opts for once daily for greater control of titration. Consider changing to Ozempic vs Trulicity in the future (both are formulary for her).  -Discontinued SGLT2-I Jardiance (empagliflozin) and added to intolerance list.  -Counseled on s/sx and management of hypoglycemia -Next A1C anticipated 12/09/17 or later

## 2017-10-21 NOTE — Assessment & Plan Note (Signed)
  ASCVD risk - primary prevention in patient with DM. Last LDL is controlled. ASCVD risk score is not >20%  - moderate intensity statin indicated. Aspirin is not indicated.  -Continued rosuvastatin 10 mg.

## 2017-10-21 NOTE — Progress Notes (Signed)
S:     Chief Complaint  Patient presents with  . Medication Management    DM    Patient arrives in good spirits, ambulating without assistance.  Presents for diabetes evaluation, education, and management at the request of Dr. Chanetta Marshall. Patient was referred on 09/08/17.  Patient was last seen by Primary Care Provider on 09/08/17. Of note, patient called into clinic on 10/04/17 after last visit with Rx Clinic reporting intolerance to Jardiance (fatigue, sweating, "Feeling crazy", trouble breathing, but no hypoglycemia) so this was dc'd and patient resumed previous insulin dosing strategies.   Today, patient reports "I feel OK but I've got a lot going on". Reports deaths in her brother and friend since last viist. States she feels in usual health since dc of Jardiance. Endorses significant fear of changing medication since near-death experience after taking moxifloxacin in the past. Reports interest in losing weight. Reports her breathing has been fine, has used symbicort and xopenex (before exercise) 2 times since our last visit.   Insurance coverage/medication affordability: [employer] Medicare Part D. Appears that patient has LIS/Extra Help with reported copays of $3 or $8.   Patient reports adherence with medications.  Current diabetes medications include: Basaglar 57 units (moved back to nighttime), novolog 5 units with breakfast and supper)  Patient denies hypoglycemic events. She brings in CBG meter for review today.   Patient reported dietary habits: Eats 3 meals/day Breakfast:oatmeal with fruit Lunch:rotiserie chicken Dinner:salmon patty, 1/2 cup rice, 1/2 baked beans Snacks:cheese crackers in the afternoon Drinks:water  Patient-reported exercise habits: very little at present per patient, some walking at the mall. Limited by knee pain.   O:  Physical Exam  Constitutional: She appears well-developed and well-nourished.     Review of Systems  All other systems reviewed  and are negative.    Lab Results  Component Value Date   HGBA1C 9.3 (A) 09/08/2017   Vitals:   10/21/17 0843 10/21/17 0924  BP: 138/78 126/72  Pulse: 90   SpO2: 97%     Lipid Panel     Component Value Date/Time   CHOL 106 04/21/2016 1438   TRIG 73 04/21/2016 1438   HDL 44 (L) 04/21/2016 1438   CHOLHDL 2.4 04/21/2016 1438   VLDL 15 04/21/2016 1438   LDLCALC 47 04/21/2016 1438   LDLDIRECT 94 12/07/2007 2019    Home fasting CBG: 90s-200s Before supper CBG: 140s-230s, exursion to 133, 290 14d avg = 163 28d avg = 173  Clinical ASCVD: No  ASCVD risk factors : age 57-75 10 year ASCVD risk: 13   A/P: Discussed case with Dr. Leveda Anna. With his permission, made the following changes:  Diabetes longstanding currently improved but still uncontrolled. Patient is able to verbalize appropriate hypoglycemia management plan. Patient is adherent with medication. Control is suboptimal due to intolerance to jardiance, unoptimized insulin dosing strategy.  -Decreased dose of basal insulin Basaglar (insulin glargine) to 50 units once daily. Continued dose of prandial insulin Novolog (insulin aspart) at 5 units with breakfast and supper -Started GLP-1 Victoza (liraglutide) 0.6 mg daily x1 week, then 1.2 mg daily x1 week, then 1.8 mg daily thereafter. Sample provided. Patient educated on purpose, proper use and potential adverse effects of n/v and weight loss.  Following instruction patient verbalized understanding of treatment plan. Discussed once daily vs once weekly GLP therapy options, patient opts for once daily for greater control of titration. Consider changing to Ozempic vs Trulicity in the future (both are formulary for her).  -Discontinued  SGLT2-I Jardiance (empagliflozin) and added to intolerance list.  -Counseled on s/sx and management of hypoglycemia -Next A1C anticipated 12/09/17 or later   ASCVD risk - primary prevention in patient with DM. Last LDL is controlled. ASCVD risk score  is not >20%  - moderate intensity statin indicated. Aspirin is not indicated.  -Continued rosuvastatin 10 mg.    Written patient instructions provided.  Total time in face to face counseling 50 minutes.   Follow up Pharmacist Clinic Visit in 3-4 weeks.  Allena Katz, Pharm.D., BCPS PGY2 Ambulatory Care Pharmacy Resident Phone: [phone]

## 2017-10-21 NOTE — Progress Notes (Addendum)
Patient ID: [name], female   DOB: May 08, 1950, 67 y.o.   MRN: [id number] Reviewed: Agree with Dr. Mariam Dollar' documentation and management.

## 2017-11-03 ENCOUNTER — Other Ambulatory Visit: Payer: Self-pay | Admitting: Cardiovascular Disease

## 2017-11-03 NOTE — Telephone Encounter (Signed)
Rx sent to pharmacy   

## 2017-11-08 ENCOUNTER — Other Ambulatory Visit: Payer: Self-pay

## 2017-11-08 ENCOUNTER — Ambulatory Visit (INDEPENDENT_AMBULATORY_CARE_PROVIDER_SITE_OTHER): Payer: Medicare Other | Admitting: *Deleted

## 2017-11-08 ENCOUNTER — Ambulatory Visit: Payer: Medicare Other

## 2017-11-08 DIAGNOSIS — IMO0002 Reserved for concepts with insufficient information to code with codable children: Secondary | ICD-10-CM

## 2017-11-08 DIAGNOSIS — I480 Paroxysmal atrial fibrillation: Secondary | ICD-10-CM | POA: Diagnosis not present

## 2017-11-08 DIAGNOSIS — Z7901 Long term (current) use of anticoagulants: Secondary | ICD-10-CM

## 2017-11-08 DIAGNOSIS — E1165 Type 2 diabetes mellitus with hyperglycemia: Principal | ICD-10-CM

## 2017-11-08 DIAGNOSIS — I4891 Unspecified atrial fibrillation: Secondary | ICD-10-CM | POA: Diagnosis not present

## 2017-11-08 DIAGNOSIS — E1140 Type 2 diabetes mellitus with diabetic neuropathy, unspecified: Secondary | ICD-10-CM

## 2017-11-08 LAB — POCT INR: INR: 2.1 (ref 2.0–3.0)

## 2017-11-08 MED ORDER — LIRAGLUTIDE 18 MG/3ML ~~LOC~~ SOPN: 1.8000 mg | PEN_INJECTOR | Freq: Every day | SUBCUTANEOUS | 0 refills | Status: DC

## 2017-11-08 MED ORDER — INSULIN PEN NEEDLE 31G X 8 MM MISC: 9 refills | Status: DC

## 2017-11-08 MED ORDER — LIRAGLUTIDE 18 MG/3ML ~~LOC~~ SOPN: 1.8000 mg | PEN_INJECTOR | Freq: Every day | SUBCUTANEOUS | 3 refills | Status: DC

## 2017-11-08 NOTE — Patient Instructions (Signed)
Description   Continue taking 1 tablet everyday except 1.5 tablets on Sundays.  Recheck INR in 6 weeks      

## 2017-11-10 ENCOUNTER — Other Ambulatory Visit: Payer: Self-pay | Admitting: Cardiovascular Disease

## 2017-11-12 ENCOUNTER — Encounter: Payer: Self-pay | Admitting: Pharmacist

## 2017-11-12 ENCOUNTER — Ambulatory Visit (INDEPENDENT_AMBULATORY_CARE_PROVIDER_SITE_OTHER): Payer: Medicare Other | Admitting: Pharmacist

## 2017-11-12 DIAGNOSIS — IMO0002 Reserved for concepts with insufficient information to code with codable children: Secondary | ICD-10-CM

## 2017-11-12 DIAGNOSIS — E1165 Type 2 diabetes mellitus with hyperglycemia: Secondary | ICD-10-CM | POA: Diagnosis not present

## 2017-11-12 DIAGNOSIS — E1140 Type 2 diabetes mellitus with diabetic neuropathy, unspecified: Secondary | ICD-10-CM

## 2017-11-12 MED ORDER — BASAGLAR KWIKPEN 100 UNIT/ML ~~LOC~~ SOPN: 40.0000 [IU] | PEN_INJECTOR | Freq: Every day | SUBCUTANEOUS | Status: DC

## 2017-11-12 MED ORDER — INSULIN ASPART 100 UNIT/ML FLEXPEN: 5.0000 [IU] | PEN_INJECTOR | SUBCUTANEOUS | 3 refills | Status: DC

## 2017-11-12 MED ORDER — LIRAGLUTIDE 18 MG/3ML ~~LOC~~ SOPN: 1.8000 mg | PEN_INJECTOR | Freq: Every day | SUBCUTANEOUS | 3 refills | Status: DC

## 2017-11-12 NOTE — Progress Notes (Signed)
S:     Chief Complaint  Patient presents with  . Medication Management    Diabetes    Patient arrives in good spirits and ambulating without any problems.  Presents for diabetes evaluation, education, and management at the request of Dr. Chanetta Marshall. Patient was referred on 09/08/17.  Patient was last seen by Primary Care Provider on 09/08/17.   Patient states she has been feeling better and seems excited about her newly controlled blood glucose levels on Victoza. She has titrated up the dose with no reported GI problems.   Patient reports adherence with medications.  Current diabetes medications include: Novolog (insulin aspart) 5 units with breakfast and supper. Patient reports only taking Novolog when blood sugars are higher than 200 since starting Victoza. Patient is also taking Basaglar 50 units at bedtime and Victoza 1.8 mg daily.    Current hypertension medications include: bisoprolol 10 mg daily  Patient denies confirmed hypoglycemic events. Reports multiple events with sweating, dizziness and confusion.    Patient reported dietary habits: Eats 2 meals/day. Patient reports she is not as hungry than she was before starting Victoza. Will eat dinner around 5pm which usually consists of a sandwich.  Snacks: Half a cup of ice cream, doesn't have too many snacks.   Patient-reported exercise habits: Reports she enjoys walking, however it has become more difficult with her knee pain.     O:  Physical Exam  Constitutional: She appears well-developed and well-nourished.  Musculoskeletal: She exhibits no edema.  Vitals reviewed. Bilateral lower extremity edema negative    Review of Systems  Musculoskeletal: Positive for joint pain.  All other systems reviewed and are negative. Continues to have right knee pain. Pending surgery.    Lab Results  Component Value Date   HGBA1C 9.3 (A) 09/08/2017   Vitals:   11/12/17 0844  BP: 122/82  Pulse: 73  SpO2: 98%    Lipid Panel     Component Value Date/Time   CHOL 106 04/21/2016 1438   TRIG 73 04/21/2016 1438   HDL 44 (L) 04/21/2016 1438   CHOLHDL 2.4 04/21/2016 1438   VLDL 15 04/21/2016 1438   LDLCALC 47 04/21/2016 1438   LDLDIRECT 94 12/07/2007 2019    7 day average: 135 14 day average: 129   Clinical ASCVD: No  ASCVD risk factors : age 68-75 10 year ASCVD risk: 13  A/P: Diabetes longstanding currently well controlled with new addition of Victoza on last visit. Patient is able to verbalize appropriate hypoglycemia management plan. Patient is adherent with medication. -Will decrease Basaglar (insulin glargine) 40 units in the morning.  counseled patient to start using Novolog 5-8 units (insulin aspart) with evening meal only. Explained to use 5 units with a regular meal or 8 units with a larger meal with more carbs.  -Will continue Victoza (liraglutide) 1.8 units daily.  -Extensively discussed pathophysiology of DM, recommended lifestyle interventions, dietary effects on glycemic control -Counseled on s/sx of and management of hypoglycemia -Next A1C anticipated 12/08/2017.   ASCVD risk - secondary prevention in patient with DM. Last LDL is controlled. ASCVD risk score is not >20%  - moderate intensity statin indicated. Aspirin is not indicated.  -Continue Rosuvastatin 10mg  daily.  BP goal = 122/82 mmHg. Patient is adherent with medication.   -Will plan to get BMET with next lab draw anticipated with A1C lab draw 12/08/17.    Written patient instructions provided.  Total time in face to face counseling 20 minutes.   Follow up  Clinic Visit in 12/08/17.   Patient seen with

## 2017-11-12 NOTE — Patient Instructions (Addendum)
It was nice seeing you today!  1. Decrease Basaglar to 40 units in the morning.  2. Start Novolog 5 units with most evening meals. If you have a high carb evening meal, then use 8 units of Novolog instead.  3. Continue Victoza 1.8 mg daily.  4. Will check your A1C on 12/08/2017.

## 2017-11-12 NOTE — Assessment & Plan Note (Signed)
Diabetes longstanding currently well controlled with new addition of Victoza on last visit. Patient is able to verbalize appropriate hypoglycemia management plan. Patient is adherent with medication. -Will decrease Basaglar (insulin glargine) 40 units in the morning.  counseled patient to start using Novolog 5-8 units (insulin aspart) with evening meal only. Explained to use 5 units with a regular meal or 8 units with a larger meal with more carbs.  -Will continue Victoza (liraglutide) 1.8 units daily.  -Extensively discussed pathophysiology of DM, recommended lifestyle interventions, dietary effects on glycemic control -Counseled on s/sx of and management of hypoglycemia

## 2017-11-15 ENCOUNTER — Ambulatory Visit (INDEPENDENT_AMBULATORY_CARE_PROVIDER_SITE_OTHER): Payer: Medicare Other

## 2017-11-15 DIAGNOSIS — Z95810 Presence of automatic (implantable) cardiac defibrillator: Secondary | ICD-10-CM

## 2017-11-15 DIAGNOSIS — I5022 Chronic systolic (congestive) heart failure: Secondary | ICD-10-CM

## 2017-11-16 NOTE — Progress Notes (Signed)
EPIC Encounter for ICM Monitoring  Patient Name: [name] is a 67 y.o. female Date: 11/16/2017 Primary Care Physican: Sela Hilding, MD Primary Cardiologist:Croitoru Electrophysiologist: Croitoru Dry Weight: 212lbs  Clinical Status (11-Oct-2017 to 15-Nov-2017)  Treated VT/VF 0 episodes  AT/AF 180 episodes   Time in AT/AF 0.7 hr/day (2.7%)   Longest AT/AF 77 minutes        Heart Failure questions reviewed, pt asymptomatic.   Thoracic impedance continues to develop new baseline after battery replacement 09/28/17.  Impedance starting to trend downward today.  Prescribed dosage:  Torsemide 20 mg 1 tablet twice daily. Potassium 20 mEq 1 tablet daily  LABS:  07/23/2017 Creatinine 1.16, BUN 16, Potassium 3.8, Sodium 136, EGFR 48-56  Recommendations:  Reinforced fluid restriction to < 2 L daily and sodium restriction to less than 2000 mg daily.  Encouraged to call for fluid symptoms.  Follow-up plan: ICM clinic phone appointment on 11/25/2017 to recheck fluid levels.     Copy of ICM check sent to Dr. Sallyanne Kuster.   3 month ICM trend: 11/15/2017    AT/AF   1 Year ICM trend:       [name], RN 11/16/2017 1:14 PM

## 2017-11-22 NOTE — Progress Notes (Signed)
Patient ID: [name], female   DOB: 10/05/1950, 67 y.o.   MRN: [id number] Reviewed: Agree with Dr. Koval's documentation and management. 

## 2017-11-24 ENCOUNTER — Ambulatory Visit: Payer: Medicare Other | Admitting: Pulmonary Disease

## 2017-11-25 ENCOUNTER — Ambulatory Visit (INDEPENDENT_AMBULATORY_CARE_PROVIDER_SITE_OTHER): Payer: Medicare Other

## 2017-11-25 DIAGNOSIS — Z95810 Presence of automatic (implantable) cardiac defibrillator: Secondary | ICD-10-CM

## 2017-11-25 DIAGNOSIS — I5022 Chronic systolic (congestive) heart failure: Secondary | ICD-10-CM

## 2017-11-25 NOTE — Progress Notes (Signed)
EPIC Encounter for ICM Monitoring  Patient Name: [name] is a 67 y.o. female Date: 11/25/2017 Primary Care Physican: Sela Hilding, MD Primary Cardiologist:Croitoru Electrophysiologist: Croitoru Dry Weight:208lbs   Clinical Status (15-Nov-2017 to 25-Nov-2017) Treated VT/VF 0 episodes AT/AF 19 episodes  Time in AT/AF 0.4 hr/day (1.5%)      Heart Failure questions reviewed, pt asymptomatic.   Thoracic impedance abnormal suggesting fluid accumulation starting 11/20/2017.  Prescribed dosage: Torsemide 20 mg 1 tablet twice daily. Potassium 20 mEq 1 tablet daily  LABS:  09/17/2017 Creatinine 0.95, BUN 12, Potassium 4.2, Sodium 140, EGFR 63-72 07/23/2017 Creatinine 1.16, BUN 16, Potassium 3.8, Sodium 136, EGFR 48-56  Recommendations: Reinforced fluid restriction to < 2 L daily and sodium restriction to less than 2000 mg daily.  Encouraged to call for fluid symptoms.  Follow-up plan: ICM clinic phone appointment on 12/06/2017 to recheck fluid levels.     Copy of ICM check sent to Dr. Sallyanne Kuster for review and if any recommendations will call her back.   3 month ICM trend: 11/25/2017    1 Year ICM trend:       [name], RN 11/25/2017 1:15 PM

## 2017-11-30 ENCOUNTER — Encounter: Payer: Self-pay | Admitting: Sports Medicine

## 2017-11-30 ENCOUNTER — Ambulatory Visit (INDEPENDENT_AMBULATORY_CARE_PROVIDER_SITE_OTHER): Payer: Medicare Other | Admitting: Sports Medicine

## 2017-11-30 DIAGNOSIS — L84 Corns and callosities: Secondary | ICD-10-CM

## 2017-11-30 DIAGNOSIS — E0842 Diabetes mellitus due to underlying condition with diabetic polyneuropathy: Secondary | ICD-10-CM

## 2017-11-30 DIAGNOSIS — I739 Peripheral vascular disease, unspecified: Secondary | ICD-10-CM

## 2017-11-30 DIAGNOSIS — E1140 Type 2 diabetes mellitus with diabetic neuropathy, unspecified: Secondary | ICD-10-CM

## 2017-11-30 DIAGNOSIS — B351 Tinea unguium: Secondary | ICD-10-CM

## 2017-11-30 DIAGNOSIS — M79609 Pain in unspecified limb: Secondary | ICD-10-CM | POA: Diagnosis not present

## 2017-11-30 DIAGNOSIS — Z794 Long term (current) use of insulin: Secondary | ICD-10-CM

## 2017-11-30 NOTE — Progress Notes (Signed)
Subjective: [name] is a 67 y.o. female patient with history of diabetes who presents to office today complaining of long,mildly painful nails  while ambulating in shoes; unable to trim. Patient states that the glucose reading this morning was not recorded. On Victoza now for Diabetes and had pacemaker placed since last visit.   Patient Active Problem List   Diagnosis Date Noted  . ICD (implantable cardioverter-defibrillator) battery depletion 09/28/2017  . Arthritis of carpometacarpal (CMC) joint of thumb 03/26/2016  . Acute idiopathic gout of right wrist 03/26/2016  . Primary localized osteoarthritis of right knee   . Visit for monitoring Tikosyn therapy 08/21/2015  . Long term current use of anticoagulant therapy 08/21/2015  . Preoperative cardiovascular examination 08/21/2015  . Ventricular tachycardia (HCC) 05/22/2015  . Obesity (BMI 30-39.9) 01/04/2014  . ICD (St. Jude Protecta dual-chamber),secondary prevention (VF arrest) January 2012 11/19/2012  . CKD (chronic kidney disease) stage 2, GFR 60-89 ml/min 02/22/2012  . Asthma, moderate persistent 12/10/2011  . Health maintenance examination 11/02/2011  . Diabetic neuropathy (HCC) 09/18/2011  . Congestive heart failure (HCC) 02/18/2011  . Chronic systolic heart failure (HCC) 05/09/2010  . OSTEOARTHRITIS, KNEE 08/28/2008  . DISEASE, VOCAL CORD NEC 10/13/2006  . Type 2 diabetes, uncontrolled, with neuropathy (HCC) 06/10/2006  . HYPERCHOLESTEROLEMIA 06/10/2006  . Essential hypertension 06/10/2006  . Atrial fibrillation (HCC) 06/10/2006  . OSA on CPAP 06/10/2006   Current Outpatient Medications on File Prior to Visit  Medication Sig Dispense Refill  . bisoprolol (ZEBETA) 5 MG tablet TAKE 2 TABLETS BY MOUTH ONCE DAILY 60 tablet 0  . budesonide-formoterol (SYMBICORT) 160-4.5 MCG/ACT inhaler Inhale 2 puffs into the lungs 2 (two) times daily as needed. 1 Inhaler 3  . buPROPion (WELLBUTRIN XL) 150 MG 24 hr tablet take 1 tablet by  mouth once daily (Patient taking differently: Take 150 mg by mouth once daily) 90 tablet 3  . chlorpheniramine (CHLOR-TRIMETON) 4 MG tablet 8mg  at night (Patient taking differently: Take 8 mg by mouth at bedtime as needed for allergies. ) 14 tablet 0  . dofetilide (TIKOSYN) 250 MCG capsule Take 1 capsule (250 mcg total) by mouth 2 (two) times daily. 60 capsule 5  . fluticasone (FLONASE) 50 MCG/ACT nasal spray instill 1 spray into each nostril twice a day (Patient taking differently: Place 1 spray into both nostrils 2 (two) times daily as needed for allergies. ) 16 g 5  . glucose blood test strip Use as instructed 100 each 12  . insulin aspart (NOVOLOG FLEXPEN) 100 UNIT/ML FlexPen Inject 5-8 Units into the skin See admin instructions. Inject 5-8 units SQ before supper 15 mL 3  . Insulin Glargine (BASAGLAR KWIKPEN) 100 UNIT/ML SOPN Inject 0.4 mLs (40 Units total) into the skin daily.    . Insulin Pen Needle (B-D ULTRAFINE III SHORT PEN) 31G X 8 MM MISC Use as directed up to 4 times daily to check blood sugar 100 each 9  . levalbuterol (XOPENEX HFA) 45 MCG/ACT inhaler INHALE 1 TO 2 PUFFS BY MOUTH INTO THE LUNGS EVERY 4 HOURS IF NEEDED FOR WHEEZING 15 Inhaler 5  . levalbuterol (XOPENEX) 0.63 MG/3ML nebulizer solution One vial in nebulizer four times daily as needed 360 mL 5  . liraglutide (VICTOZA) 18 MG/3ML SOPN Inject 0.3 mLs (1.8 mg total) into the skin daily. 3 pen 3  . montelukast (SINGULAIR) 10 MG tablet TAKE 1 TABLET BY MOUTH AT BEDTIME 30 tablet 4  . polyethylene glycol powder (GLYCOLAX/MIRALAX) powder take 17GM (DISSOLVED IN WATER) by  mouth once daily THREE TIMES A WEEK if needed as directed (Patient taking differently: Mix 17 g in liquid and drink 3 times weekly) 500 g 3  . potassium chloride SA (K-DUR,KLOR-CON) 20 MEQ tablet Take 1 tablet (20 mEq total) by mouth daily as directed. (Patient taking differently: Take 20 mEq by mouth daily as needed (with Torsemide). ) 30 tablet 11  . PRODIGY  LANCETS 28G MISC 1 Units by Does not apply route 4 (four) times daily. 100 each 4  . ranitidine (ZANTAC) 150 MG tablet Take 1 tablet (150 mg total) by mouth daily. 30 tablet 5  . rosuvastatin (CRESTOR) 10 MG tablet Take 1 tablet (10 mg total) by mouth daily. 90 tablet 2  . torsemide (DEMADEX) 20 MG tablet Take 1 tablet (20 mg total) by mouth twice daily as directed. (Patient taking differently: Take 20 mg by mouth daily. ) 60 tablet 10  . warfarin (COUMADIN) 5 MG tablet TAKE 1 TO 1.5 TABLETS BY MOUTH ONCE DAILY AS DIRECTED BY COUMADIN CLINIC 135 tablet 0   No current facility-administered medications on file prior to visit.    Allergies  Allergen Reactions  . Avelox [Moxifloxacin Hcl In Nacl] Other (See Comments)    Cardiac arrest per pt  . Nsaids     Cardiac arrest per pt  . Simvastatin Other (See Comments)    myalgias myalgias  . Moxifloxacin   . Ace Inhibitors Other (See Comments) and Cough    Dry cough  Dry cough   . Jardiance [Empagliflozin] Other (See Comments)    Felt "crazy", fatigue, sweating, denies hypoglycemia while taking  . Latex Rash    Recent Results (from the past 2160 hour(s))  HgB A1c     Status: Abnormal   Collection Time: 09/08/17  9:15 AM  Result Value Ref Range   Hemoglobin A1C  4.0 - 5.6 %   HbA1c, POC (prediabetic range)  5.7 - 6.4 %   HbA1c, POC (controlled diabetic range) 9.3 (A) 0.0 - 7.0 %  POCT INR     Status: None   Collection Time: 09/17/17  9:07 AM  Result Value Ref Range   INR 2.1 2.0 - 3.0  Basic metabolic panel     Status: Abnormal   Collection Time: 09/17/17  9:24 AM  Result Value Ref Range   Glucose 199 (H) 65 - 99 mg/dL   BUN 12 8 - 27 mg/dL   Creatinine, Ser 1.61 0.57 - 1.00 mg/dL   GFR calc non Af Amer 63 >59 mL/min/1.73   GFR calc Af Amer 72 >59 mL/min/1.73   BUN/Creatinine Ratio 13 12 - 28   Sodium 140 134 - 144 mmol/L   Potassium 4.2 3.5 - 5.2 mmol/L   Chloride 102 96 - 106 mmol/L   CO2 25 20 - 29 mmol/L   Calcium 8.5 (L)  8.7 - 10.3 mg/dL  CBC     Status: None   Collection Time: 09/17/17  9:24 AM  Result Value Ref Range   WBC 4.9 3.4 - 10.8 x10E3/uL   RBC 4.11 3.77 - 5.28 x10E6/uL   Hemoglobin 11.9 11.1 - 15.9 g/dL   Hematocrit 09.6 04.5 - 46.6 %   MCV 89 79 - 97 fL   MCH 29.0 26.6 - 33.0 pg   MCHC 32.6 31.5 - 35.7 g/dL   RDW 40.9 81.1 - 91.4 %   Platelets 219 150 - 450 x10E3/uL  Protime-INR     Status: Abnormal   Collection Time: 09/17/17  9:24  AM  Result Value Ref Range   INR 1.9 (H) 0.8 - 1.2    Comment: Reference interval is for non-anticoagulated patients. Suggested INR therapeutic range for Vitamin K antagonist therapy:    Standard Dose (moderate intensity                   therapeutic range):       2.0 - 3.0    Higher intensity therapeutic range       2.5 - 3.5    Prothrombin Time 19.1 (H) 9.1 - 12.0 sec  POCT INR     Status: None   Collection Time: 09/27/17  8:33 AM  Result Value Ref Range   INR 2.0 2.0 - 3.0  Glucose, capillary     Status: Abnormal   Collection Time: 09/28/17 11:24 AM  Result Value Ref Range   Glucose-Capillary 110 (H) 65 - 99 mg/dL  Surgical PCR screen     Status: None   Collection Time: 09/28/17 11:51 AM  Result Value Ref Range   MRSA, PCR NEGATIVE NEGATIVE   Staphylococcus aureus NEGATIVE NEGATIVE    Comment: (NOTE) The Xpert SA Assay (FDA approved for NASAL specimens in patients 50 years of age and older), is one component of a comprehensive surveillance program. It is not intended to diagnose infection nor to guide or monitor treatment. Performed at [facility], [address], [place], [place] [address]   Protime-INR     Status: Abnormal   Collection Time: 09/28/17 11:52 AM  Result Value Ref Range   Prothrombin Time 27.6 (H) 11.4 - 15.2 seconds   INR 2.60     Comment: Performed at [facility], [address], [place], [place] [address]  CUP PACEART [facility] DEVICE CHECK     Status: None   Collection Time: 10/11/17  3:11 PM  Result  Value Ref Range   Date Time Interrogation Session [phone]    Pulse Generator Manufacturer MERM    Pulse Gen Model [id number] Evera MRI XT DR    Pulse Gen Serial Number [id number]    Clinic Name [facility]    Implantable Pulse Generator Type Implantable Cardiac Defibulator    Implantable Pulse Generator Implant Date [id number]    Implantable Lead Manufacturer [facility]    Implantable Lead Model 5076 CapSureFix Novus    Implantable Lead Serial Number [id number]    Implantable Lead Implant Date [id number]    Implantable Lead Location [id number]    Implantable Lead Manufacturer [facility]    Implantable Lead Model 6935 Sprint Quattro Secure S    Implantable Lead Serial Number [id number]    Implantable Lead Implant Date [id number]    Implantable Lead Location [id number]    Lead Channel Setting Sensing Sensitivity 0.3 mV   Lead Channel Setting Pacing Amplitude 2 V   Lead Channel Setting Pacing Pulse Width 0.4 ms   Lead Channel Setting Pacing Amplitude 2.5 V   Lead Channel Impedance Value 456 ohm   Lead Channel Sensing Intrinsic Amplitude 0.5 mV   Lead Channel Sensing Intrinsic Amplitude 1.625 mV   Lead Channel Pacing Threshold Amplitude 0.50 V   Lead Channel Pacing Threshold Pulse Width 0.4 ms   Lead Channel Impedance Value 456 ohm   Lead Channel Impedance Value 399 ohm   Lead Channel Sensing Intrinsic Amplitude 15.875 mV   Lead Channel Sensing Intrinsic Amplitude 13.625 mV   Lead Channel Pacing Threshold Amplitude 0.75 V   Lead Channel Pacing Threshold Pulse Width 0.4 ms  HighPow Impedance 70 ohm   Battery Status OK    Battery Remaining Longevity 114 mo   Battery Voltage 3.12 V   Brady Statistic RA Percent Paced 94.91 %   Brady Statistic RV Percent Paced 0.85 %   Brady Statistic AP VP Percent 0.79 %   Brady Statistic AS VP Percent 0.03 %   Brady Statistic AP VS Percent 96.09 %   Brady Statistic AS VS Percent 3.10 %   Eval Rhythm SB 42   POCT INR     Status: None   Collection Time:  11/08/17 10:49 AM  Result Value Ref Range   INR 2.1 2.0 - 3.0    Objective: General: Patient is awake, alert, and oriented x 3 and in no acute distress.  Integument: Skin is warm, dry and supple bilateral. Nails are tender, long, thickened and  dystrophic with subungual debris, consistent with onychomycosis, 1-5 bilateral. No signs of infection. + callus hallux, sub 3-4, and 5th toes bilateral. Remaining integument unremarkable.  Vasculature:  Dorsalis Pedis pulse 1/4 bilateral. Posterior Tibial pulse  1/4 bilateral.  Capillary fill time <3 sec 1-5 bilateral. Scant hair growth to the level of the digits. Temperature gradient within normal limits. No varicosities present bilateral. No edema present bilateral.   Neurology: The patient has intact sensation measured with a 5.07/10g Semmes Weinstein Monofilament at all pedal sites bilateral . Vibratory sensation diminished bilateral with tuning fork. No Babinski sign present bilateral.   Musculoskeletal:  Asymptomatic pes planus, bunion and hammertoe pedal deformities noted bilateral. Muscular strength 5/5 in all lower extremity muscular groups bilateral without pain on range of motion . No tenderness with calf compression bilateral.  Assessment and Plan: Problem List Items Addressed This Visit      Endocrine   Diabetic neuropathy (HCC) (Chronic)    Other Visit Diagnoses    Pain due to onychomycosis of nail    -  Primary   Corns and callosities       Peripheral arterial disease (HCC)       Type 2 diabetes mellitus with diabetic neuropathy, with long-term current use of insulin (HCC)          -Examined patient. -Discussed and educated patient on diabetic foot care, especially with  regards to the vascular, neurological and musculoskeletal systems.  -Stressed the importance of good glycemic control and the detriment of not  controlling glucose levels in relation to the foot. -ABN on file -Mechanically debrided callus >5 using sterile  chisel blade and debrided all nails 1-5 bilateral using sterile nail nipper and filed with dremel without incident  -Continue with diabetic shoes and insoles  -Patient to return  in 3 months for at risk foot care -Patient advised to call the office if any problems or questions arise in the meantime.  Asencion Islam, DPM

## 2017-12-03 ENCOUNTER — Other Ambulatory Visit: Payer: Self-pay | Admitting: Cardiovascular Disease

## 2017-12-06 ENCOUNTER — Ambulatory Visit (INDEPENDENT_AMBULATORY_CARE_PROVIDER_SITE_OTHER): Payer: Medicare Other

## 2017-12-06 ENCOUNTER — Encounter: Payer: Medicare Other | Admitting: Cardiovascular Disease

## 2017-12-06 DIAGNOSIS — Z95810 Presence of automatic (implantable) cardiac defibrillator: Secondary | ICD-10-CM

## 2017-12-06 DIAGNOSIS — I5022 Chronic systolic (congestive) heart failure: Secondary | ICD-10-CM

## 2017-12-06 NOTE — Progress Notes (Signed)
EPIC Encounter for ICM Monitoring  Patient Name: [name] is a 67 y.o. female Date: 12/06/2017 Primary Care Physican: Sela Hilding, MD Primary Cardiologist:Croitoru Electrophysiologist: Croitoru Dry Weight:208lbs    Clinical Status (25-Nov-2017 to 06-Dec-2017) Treated VT/VF 0 episodes  AT/AF 2 episodes  Time in AT/AF 0.2 hr/day (0.7%) Longest AT/AF 109 minutes     Heart Failure questions reviewed, pt asymptomatic.  She said the dentist was supposed to get in touch with Dr Croitoru's office regarding blood thinner instructions so she can get the tooth pulled that is causing a lot of pain.  Advised I will send copy of my note to Dr Sallyanne Kuster about needing instructions for dental clearance.  Dentist name is Dr Mariana Arn at [phone].   She continues to have knee pain and hoping the A1C will be low enough in the next month to have the total knee surgery.     Thoracic impedance abnormal suggesting fluid accumulation starting 12/02/2017 and slight trending toward baseline.  Prescribed dosage: Torsemide 20 mg 1 tablet twice daily. Potassium 20 mEq 1 tablet daily  LABS:  09/17/2017 Creatinine 0.95, BUN 12, Potassium 4.2, Sodium 140, EGFR 63-72 07/23/2017 Creatinine 1.16, BUN 16, Potassium 3.8, Sodium 136, EGFR 48-56  Recommendations:  Advised to take extra 1 tablet of Torsemide tomorrow.    Follow-up plan: ICM clinic phone appointment on 12/16/2017 to recheck fluid levels.   Office appointment scheduled 01/05/2018 with Dr. Sallyanne Kuster.    Copy of ICM check sent to Dr. Sallyanne Kuster.   3 month ICM trend: 12/06/2017    AT/AF    1 Year ICM trend:       [name], RN 12/06/2017 1:57 PM

## 2017-12-08 ENCOUNTER — Encounter: Payer: Self-pay | Admitting: Family Medicine

## 2017-12-08 ENCOUNTER — Other Ambulatory Visit: Payer: Self-pay

## 2017-12-08 ENCOUNTER — Ambulatory Visit (INDEPENDENT_AMBULATORY_CARE_PROVIDER_SITE_OTHER): Payer: Medicare Other | Admitting: Family Medicine

## 2017-12-08 VITALS — BP 126/78 | HR 88 | Temp 98.4°F | Ht 67.0 in | Wt 207.0 lb

## 2017-12-08 DIAGNOSIS — E1165 Type 2 diabetes mellitus with hyperglycemia: Secondary | ICD-10-CM | POA: Diagnosis not present

## 2017-12-08 DIAGNOSIS — E1140 Type 2 diabetes mellitus with diabetic neuropathy, unspecified: Secondary | ICD-10-CM | POA: Diagnosis not present

## 2017-12-08 DIAGNOSIS — Z23 Encounter for immunization: Secondary | ICD-10-CM | POA: Diagnosis not present

## 2017-12-08 DIAGNOSIS — IMO0002 Reserved for concepts with insufficient information to code with codable children: Secondary | ICD-10-CM

## 2017-12-08 NOTE — Patient Instructions (Signed)
It was a pleasure to see you today! Thank you for choosing [facility] for your primary care. [name] was seen for diabetes.   Our plans for today were:  Keep up the good work! I will call you as soon as we get labs back, and I will let you know if we need to change anything.    You should return to our clinic to see Dr. Chanetta Marshall in 3 months for diabetes.   Best,  Dr. Chanetta Marshall

## 2017-12-08 NOTE — Assessment & Plan Note (Addendum)
a1c machine down today, will call patient with results and make plan once results return from serum draw. Recheck BMP today. Return in 3 months.

## 2017-12-08 NOTE — Progress Notes (Signed)
   CC: DM  HPI  DM- last plan with Dr. Raymondo Band in early august was basaglar 40U, Novolog 5-8 U with dinner. Continued victoza. Since that visit, she reports that the numbers have been really good, all of them. Forgot meter today. 140 has been the highest. Never to 200 anymore. 119, 120. Also been walking a little because she has knee pain. Also has toothache, needs tooth pulled but has to have cards clearance to do this. Down 6 lbs since last visit, continued dietary management of this. She has not been using novolog more than 1-3 times since last visit and is not doing this because she had one episode of CBG to 70s and felt bad.   Wt Readings from Last 3 Encounters:  12/08/17 207 lb (93.9 kg)  11/12/17 213 lb 3.2 oz (96.7 kg)  10/21/17 213 lb (96.6 kg)   Have her on antibiotic for dental infection.    ROS: Denies CP, SOB, abdominal pain, dysuria, changes in BMs.   CC, SH/smoking status, and VS noted  Objective: BP 126/78   Pulse 88   Temp 98.4 F (36.9 C) (Oral)   Ht 5\' 7"  (1.702 m)   Wt 207 lb (93.9 kg)   LMP 07/26/2011   SpO2 99%   BMI 32.42 kg/m  Gen: NAD, alert, cooperative, and pleasant. HEENT: NCAT, EOMI, PERRL CV: RRR, no murmur Resp: CTAB, no wheezes, non-labored Ext: No edema, warm Neuro: Alert and oriented, Speech clear, No gross deficits  Assessment and plan:  Type 2 diabetes, uncontrolled, with neuropathy (HCC) a1c machine down today, will call patient with results and make plan once results return from serum draw. Recheck BMP today. Return in 3 months.    Orders Placed This Encounter  Procedures  . Flu Vaccine QUAD 36+ mos IM  . Basic metabolic panel  . Hemoglobin A1c    Health Maintenance reviewed - flu shot given today.  Loni Muse, MD, PGY3 12/08/2017 10:48 AM

## 2017-12-09 LAB — BASIC METABOLIC PANEL
BUN/Creatinine Ratio: 16 (ref 12–28)
BUN: 20 mg/dL (ref 8–27)
CO2: 25 mmol/L (ref 20–29)
CREATININE: 1.27 mg/dL — AB (ref 0.57–1.00)
Calcium: 8.6 mg/dL — ABNORMAL LOW (ref 8.7–10.3)
Chloride: 100 mmol/L (ref 96–106)
GFR calc Af Amer: 51 mL/min/{1.73_m2} — ABNORMAL LOW (ref >59)
GFR, EST NON AFRICAN AMERICAN: 44 mL/min/{1.73_m2} — AB (ref >59)
GLUCOSE: 165 mg/dL — AB (ref 65–99)
Potassium: 4.3 mmol/L (ref 3.5–5.2)
SODIUM: 138 mmol/L (ref 134–144)

## 2017-12-09 LAB — HEMOGLOBIN A1C
ESTIMATED AVERAGE GLUCOSE: 186 mg/dL
HEMOGLOBIN A1C: 8.1 % — AB (ref 4.8–5.6)

## 2017-12-15 ENCOUNTER — Telehealth: Payer: Self-pay | Admitting: Cardiovascular Disease

## 2017-12-15 ENCOUNTER — Telehealth: Payer: Self-pay | Admitting: Family Medicine

## 2017-12-15 NOTE — Telephone Encounter (Signed)
Please call Misty Stanley back, this patient's coumadin is managed by Dr. Elspeth Cho with [facility] cards. She should call their office.

## 2017-12-15 NOTE — Telephone Encounter (Signed)
Lisa from Dr. Isac Sarna office would like someone to call her concerning this pt. Pt is having two teeth removed and they need to know what the plan should be for her Coumadin. The best number to contact Misty Stanley is [phone].

## 2017-12-15 NOTE — Telephone Encounter (Signed)
Informed Misty Stanley of info below. Sunday Spillers, CMA

## 2017-12-15 NOTE — Telephone Encounter (Signed)
1. .What dental office are you calling from? Dr Alma Friendly  2. What is your office phone number? [phone]   3. What is your fax number? [phone]  4. What type of procedure is the patient having performed?  2 teeth extracted   5. What date is procedure scheduled or is the patient there now? Pending   6. What is your question (ex. Antibiotics prior to procedure, holding medication-we need to know how long dentist wants pt to hold med)?  Can pt stop Coumadin? If so, when and for how long?

## 2017-12-16 ENCOUNTER — Ambulatory Visit (INDEPENDENT_AMBULATORY_CARE_PROVIDER_SITE_OTHER): Payer: Medicare Other

## 2017-12-16 ENCOUNTER — Telehealth: Payer: Self-pay | Admitting: Cardiology

## 2017-12-16 DIAGNOSIS — Z95810 Presence of automatic (implantable) cardiac defibrillator: Secondary | ICD-10-CM | POA: Diagnosis not present

## 2017-12-16 DIAGNOSIS — I5022 Chronic systolic (congestive) heart failure: Secondary | ICD-10-CM | POA: Diagnosis not present

## 2017-12-16 NOTE — Telephone Encounter (Signed)
LMOVM reminding pt to send remote transmission.   

## 2017-12-16 NOTE — Telephone Encounter (Signed)
Patient with diagnosis of atrial fibrillation on warfarin for anticoagulation.    Procedure: dental extraction - 2 teeth Date of procedure: TBD  CHADS2-VASc score of  5 (CHF, HTN, AGE, DM2, , female)  CrCl 64.6 Platelet count 219  Per our protocol do not recommend stopping warfarin for 1-2 dental extractions.  We can see the patient 1-2 days prior to extraction appointment to check INR and adjust dose to be on low end of INR range if dentist would prefer.

## 2017-12-17 NOTE — Progress Notes (Signed)
Thank you MCr 

## 2017-12-17 NOTE — Progress Notes (Signed)
EPIC Encounter for ICM Monitoring  Patient Name: [name] is a 68 y.o. female Date: 12/17/2017 Primary Care Physican: Sela Hilding, MD Primary Cardiologist:Croitoru Electrophysiologist: Croitoru Dry Weight:206lbs      Heart Failure questions reviewed, pt asymptomatic.   Thoracic impedance normal.  Prescribed dosage: Torsemide 20 mg 1 tablet twice daily. Potassium 20 mEq 1 tablet daily  LABS: 09/17/2017 Creatinine 0.95, BUN 12, Potassium 4.2, Sodium 140, EGFR 63-72 07/23/2017 Creatinine 1.16, BUN 16, Potassium 3.8, Sodium 136, EGFR 48-56  Recommendations: No changes.   Encouraged to call for fluid symptoms.  Follow-up plan: ICM clinic phone appointment on 02/07/2018.  Office appointment scheduled 01/05/2018 with Dr. Sallyanne Kuster.    Copy of ICM check sent to Dr. Sallyanne Kuster.   3 month ICM trend: 12/16/2017    AT/AF   1 Year ICM trend:       [name], RN 12/17/2017 4:50 PM

## 2017-12-20 ENCOUNTER — Ambulatory Visit (INDEPENDENT_AMBULATORY_CARE_PROVIDER_SITE_OTHER): Payer: Medicare Other | Admitting: Pharmacist Clinician (PhC)/ Clinical Pharmacy Specialist

## 2017-12-20 DIAGNOSIS — I4891 Unspecified atrial fibrillation: Secondary | ICD-10-CM

## 2017-12-20 DIAGNOSIS — I481 Persistent atrial fibrillation: Secondary | ICD-10-CM

## 2017-12-20 DIAGNOSIS — Z7901 Long term (current) use of anticoagulants: Secondary | ICD-10-CM | POA: Diagnosis not present

## 2017-12-20 DIAGNOSIS — I4819 Other persistent atrial fibrillation: Secondary | ICD-10-CM

## 2017-12-20 LAB — POCT INR: INR: 2.2 (ref 2.0–3.0)

## 2017-12-20 NOTE — Patient Instructions (Signed)
Description   Continue taking 1 tablet everyday except 1.5 tablets on Sundays.  Recheck INR in 6 weeks

## 2017-12-28 ENCOUNTER — Encounter: Payer: Self-pay | Admitting: Pulmonary Disease

## 2017-12-28 ENCOUNTER — Ambulatory Visit (INDEPENDENT_AMBULATORY_CARE_PROVIDER_SITE_OTHER): Payer: Medicare Other | Admitting: Pulmonary Disease

## 2017-12-28 DIAGNOSIS — G4733 Obstructive sleep apnea (adult) (pediatric): Secondary | ICD-10-CM

## 2017-12-28 DIAGNOSIS — Z9989 Dependence on other enabling machines and devices: Secondary | ICD-10-CM

## 2017-12-28 DIAGNOSIS — J4540 Moderate persistent asthma, uncomplicated: Secondary | ICD-10-CM

## 2017-12-28 NOTE — Assessment & Plan Note (Signed)
Asthma appears well controlled, continue Symbicort 2 puffs twice daily, rinse mouth after use  Call us if you have an exacerbation  In the future, consider decreasing to Symbicort 80

## 2017-12-28 NOTE — Assessment & Plan Note (Signed)
Very compliant and CPAP has definitely helped improve her daytime somnolence and fatigue  Continue with 8 cm

## 2017-12-28 NOTE — Progress Notes (Signed)
   Subjective:    Patient ID: [name], female    DOB: June 14, 1950, 67 y.o.   MRN: [id number]  HPI  67 yo for FU of for moderate persistent asthma and obstructive sleep apnea CPAP 8 cm  Last flareup was 4 months ago.  No clear trigger.  She has done well since then, compliant with Symbicort, does not need rescue inhaler much.  Atrial fibrillation is controlled on Tikosyn.  No overt symptoms of heart failure, no orthopnea or pedal edema.  She is compliant with CPAP, no problems with nasal pillows or pressure. Download was reviewed which shows excellent control of events on 8 cm and good usage more than 7.5 hours every night with no residual events  Significant tests/ events reviewed  Sleep study 02/2014 : severe apnea, corrected with nasal pillows and 8cmh2o  PFT 06/19/16: FVC 2.74 L (95%) FEV1 2.06 L (92%) FEV1/FVC 0.75  negative bronchodilator response DLCO corrected 58%  11/04/15: FVC 2.95 L (102%) FEV1 2.10 L (93%) FEV1/FVC 0.71  negative bronchodilator response TLC 6.02 L (109%) RV 109% ERV 47% DLCO uncorrected 75% (hgb 12.7)   12/06/08: FVC 3.43 L (99%) FEV1 2.46 L (94%) FEV1/FVC 0.72  negative bronchodilator response TLC 6.17 L (112%) RV 131% ERV 52% DLCO uncorrected 74%  Review of Systems neg for any significant sore throat, dysphagia, itching, sneezing, nasal congestion or excess/ purulent secretions, fever, chills, sweats, unintended wt loss, pleuritic or exertional cp, hempoptysis, orthopnea pnd or change in chronic leg swelling. Also denies presyncope, palpitations, heartburn, abdominal pain, nausea, vomiting, diarrhea or change in bowel or urinary habits, dysuria,hematuria, rash, arthralgias, visual complaints, headache, numbness weakness or ataxia.     Objective:   Physical Exam  Gen. Pleasant, well-nourished, in no distress ENT - no thrush, no post nasal drip Neck: No JVD, no thyromegaly, no  carotid bruits Lungs: no use of accessory muscles, no dullness to percussion, clear without rales or rhonchi  Cardiovascular: Rhythm regular, heart sounds  normal, no murmurs or gallops, no peripheral edema Musculoskeletal: No deformities, no cyanosis or clubbing        Assessment & Plan:

## 2017-12-28 NOTE — Patient Instructions (Signed)
Asthma appears well controlled, continue Symbicort 2 puffs twice daily, rinse mouth after use  Call us if you have an exacerbation

## 2018-01-02 ENCOUNTER — Other Ambulatory Visit: Payer: Self-pay | Admitting: Cardiovascular Disease

## 2018-01-02 ENCOUNTER — Other Ambulatory Visit: Payer: Self-pay | Admitting: Pulmonary Disease

## 2018-01-02 ENCOUNTER — Other Ambulatory Visit: Payer: Self-pay | Admitting: Adult Health

## 2018-01-05 ENCOUNTER — Encounter: Payer: Self-pay | Admitting: Cardiovascular Disease

## 2018-01-05 ENCOUNTER — Ambulatory Visit (INDEPENDENT_AMBULATORY_CARE_PROVIDER_SITE_OTHER): Payer: Medicare Other | Admitting: Cardiovascular Disease

## 2018-01-05 ENCOUNTER — Encounter: Payer: Medicare Other | Admitting: Cardiovascular Disease

## 2018-01-05 VITALS — BP 126/70 | HR 93 | Ht 66.0 in | Wt 204.6 lb

## 2018-01-05 DIAGNOSIS — Z5181 Encounter for therapeutic drug level monitoring: Secondary | ICD-10-CM | POA: Diagnosis not present

## 2018-01-05 DIAGNOSIS — G4733 Obstructive sleep apnea (adult) (pediatric): Secondary | ICD-10-CM | POA: Diagnosis not present

## 2018-01-05 DIAGNOSIS — N183 Chronic kidney disease, stage 3 unspecified: Secondary | ICD-10-CM

## 2018-01-05 DIAGNOSIS — Z95810 Presence of automatic (implantable) cardiac defibrillator: Secondary | ICD-10-CM | POA: Diagnosis not present

## 2018-01-05 DIAGNOSIS — E782 Mixed hyperlipidemia: Secondary | ICD-10-CM

## 2018-01-05 DIAGNOSIS — J45909 Unspecified asthma, uncomplicated: Secondary | ICD-10-CM | POA: Diagnosis not present

## 2018-01-05 DIAGNOSIS — I5032 Chronic diastolic (congestive) heart failure: Secondary | ICD-10-CM

## 2018-01-05 DIAGNOSIS — I10 Essential (primary) hypertension: Secondary | ICD-10-CM | POA: Diagnosis not present

## 2018-01-05 DIAGNOSIS — I480 Paroxysmal atrial fibrillation: Secondary | ICD-10-CM

## 2018-01-05 DIAGNOSIS — Z79899 Other long term (current) drug therapy: Secondary | ICD-10-CM

## 2018-01-05 DIAGNOSIS — Z7901 Long term (current) use of anticoagulants: Secondary | ICD-10-CM | POA: Diagnosis not present

## 2018-01-05 DIAGNOSIS — E1169 Type 2 diabetes mellitus with other specified complication: Secondary | ICD-10-CM

## 2018-01-05 DIAGNOSIS — E669 Obesity, unspecified: Secondary | ICD-10-CM | POA: Diagnosis not present

## 2018-01-05 DIAGNOSIS — I472 Ventricular tachycardia, unspecified: Secondary | ICD-10-CM

## 2018-01-05 NOTE — Patient Instructions (Signed)
Dr Royann Shivers recommends that you continue on your current medications as directed. Please refer to the Current Medication list given to you today.  Your physician recommends that you return for lab work in February 2020.  Remote monitoring is used to monitor your Pacemaker or ICD from home. This monitoring reduces the number of office visits required to check your device to one time per year. It allows [place] to keep an eye on the functioning of your device to ensure it is working properly. You are scheduled for a device check from home on Monday, October 28th, 2019. You may send your transmission at any time that day. If you have a wireless device, the transmission will be sent automatically. After your physician reviews your transmission, you will receive a postcard with your next transmission date.  To improve our patient care and to more adequately follow your device, [facility] has decided, as a practice, to start following each patient four times a year with your home monitor. This means that you may experience a remote appointment that is close to an in-office appointment with your physician. Your insurance will apply at the same rate as other remote monitoring transmissions.  Dr Royann Shivers recommends that you schedule a follow-up appointment in 6 months with an ICD check. You will receive a reminder letter in the mail two months in advance. If you don't receive a letter, please call our office to schedule the follow-up appointment.  If you need a refill on your cardiac medications before your next appointment, please call your pharmacy.

## 2018-01-05 NOTE — Progress Notes (Signed)
Patient ID: [name], female   DOB: [birth year], 67 y.o.   MRN: [id number] Patient ID: [name], female   DOB: 26-Dec-1950, 67 y.o.   MRN: [id number]    Cardiology Office Note    Date:  01/07/2018   ID:  [name], DOB 1950-11-10, MRN [id number]  PCP:  Garth Bigness, MD  Cardiologist:   Thurmon Fair, MD   Chief Complaint  Patient presents with  . Follow-up    ICD changeout, VT, AFib    History of Present Illness:  [name] is a 67 y.o. female with a long-standing history of nonischemic cardiomyopathy, chronic systolic heart failure and paroxysmal atrial fibrillation.  This is her first appointment since her defibrillator generator change out in September 28, 2017.  The patient specifically denies any chest pain at rest exertion, dyspnea at rest or with exertion, orthopnea, paroxysmal nocturnal dyspnea, syncope, palpitations, focal neurological deficits, intermittent claudication, lower extremity edema, unexplained weight gain, cough, hemoptysis or wheezing.    She has to bring her hemoglobin A1c down before she can undergo knee surgery with Dr. Thurston Hole.  She has started treatment with Victoza and this has helped a lot.  She is also lost a little bit of weight.  Her defibrillator shows an overall burden of atrial fibrillation of 1.6%, which is actually substantially lower than her previous historic burden of around 7%.  Longest episode was about 6 hours.  Ventricular rate control is acceptable around 100 bpm.   She is only had one very brief episode of 9-beat nonsustained ventricular tachycardia.  There is no sustained ventricular arrhythmia.  She has 96.5 % atrial pacing and only 0.5 % ventricular pacing.  The heart rate histogram distribution is appropriate.  Her thoracic impedance has been steady without any evidence of fluid overload since her last download.  Activity level is steady at around 3.3 hours a day.  She had antitachycardia pacing for ventricular tachycardia in  December 2016. Since then a handful of episodes of nonsustained VT have been recorded, none meeting criteria for treatment. She is compliant with CPAP for obstructive sleep apnea.  She has a history of moderate nonischemic cardiomyopathy (no coronary disease by cardiac catheterization January 2012), recurrent paroxysmal atrial fibrillation status post 2 ablation procedures (Dr. Orson Aloe), with history of ventricular fibrillation arrest while on treatment with diltiazem and dofetilide and a prolonged QT interval, probably related to simultaneous quinolone therapy. She has a dual-chamber Medtronic Protecta defibrillator implanted in January 2012. In December 2016 her defibrillator recorded an episode of sustained monomorphic ventricular tachycardia. Antitachycardia pacing was unsuccessful at converting the arrhythmia, but she had a "dirty break" before defibrillator shock was delivered. The shock was aborted. Polymorphic VT lasting 2 seconds was recorded in the fall of 2017 during asthma exacerbation, but did not require intervention..  Most recent echo Sept 2017 showed improved LVEF 50%. At LV angiography in 2012, EF was 15-20%.  Past Medical History:  Diagnosis Date  . AICD (automatic cardioverter/defibrillator) present    Medtronic  . Anemia   . Anxiety   . Arthritis   . Asthma    has had multiple hospitalizations for this  . Atrial fibrillation (HCC)    ablation x 2 [facility], 01/2006, 2011.  on warfarin  . Cardiac arrest [facility]) jan 2012   in hospital for pneumonia when this occured- occured at the hospital  . CHF (congestive heart failure) (HCC) 2012   Echo 08/08/10 by [facility]. EF 35-45%. LV systolic  function moderately reduced. Moderate global hypokinesis of LV.  RV systolic function moderately reduced. Mild MR. Trace AR.  Marland Kitchen Complication of anesthesia    difficult time waking up after anesthesia  . Depression   . Diabetes mellitus    Type 2  . Dysrhythmia    Atrial fibrillation    . GASTROESOPHAGEAL REFLUX, NO ESOPHAGITIS 06/10/2006   Qualifier: Diagnosis of  By: Abundio Miu    . GERD (gastroesophageal reflux disease)   . History of hiatal hernia   . Hyperlipidemia   . Hypertension   . Limb pain 06/04/2008   LLE, Baker's cyst in popliteal fossa, no DVT  . Non-ischemic cardiomyopathy (HCC)    echo 08/08/10 - EF 35-45% LV and RV systolic function mod reduced  . Primary localized osteoarthritis of left knee   . Primary localized osteoarthritis of right knee   . Primary localized osteoarthritis of right knee   . Sleep apnea    wears CPAP nightly  . Vocal cord disease   . Wears glasses     Past Surgical History:  Procedure Laterality Date  . BREAST EXCISIONAL BIOPSY Right 1999  . BREAST LUMPECTOMY Right   . CARDIAC DEFIBRILLATOR PLACEMENT  05/02/10   Medtronic Protecta XT-DR for CHF-VT, last download 04/12/12  . CHOLECYSTECTOMY    . COLONOSCOPY    . HAMMER TOE SURGERY Right   . ICD GENERATOR CHANGEOUT N/A 09/28/2017   Procedure: ICD GENERATOR CHANGEOUT;  Surgeon: Thurmon Fair, MD;  Location: [facility];  Service: Cardiovascular;  Laterality: N/A;  . KNEE ARTHROSCOPY Right   . PAF Ablation     By Dr Sampson Goon. Now sees Dr Steele Berg at [facility]  . TOTAL KNEE ARTHROPLASTY Left 09/17/2014  . TOTAL KNEE ARTHROPLASTY Left 09/17/2014   Procedure: TOTAL KNEE ARTHROPLASTY;  Surgeon: Salvatore Marvel, MD;  Location: [facility] OR;  Service: Orthopedics;  Laterality: Left;  pt has ICD  . TUBAL LIGATION      Outpatient Medications Prior to Visit  Medication Sig Dispense Refill  . amoxicillin (AMOXIL) 500 MG capsule Take 1 capsule by mouth 3 (three) times daily. Until gone  0  . bisoprolol (ZEBETA) 5 MG tablet TAKE 2 TABLETS BY MOUTH ONCE DAILY 60 tablet 3  . budesonide-formoterol (SYMBICORT) 160-4.5 MCG/ACT inhaler Inhale 2 puffs into the lungs 2 (two) times daily as needed. 1 Inhaler 3  . buPROPion (WELLBUTRIN XL) 150 MG 24 hr tablet take 1 tablet by mouth once daily  (Patient taking differently: Take 150 mg by mouth once daily) 90 tablet 3  . chlorpheniramine (CHLOR-TRIMETON) 4 MG tablet 8mg  at night (Patient taking differently: Take 8 mg by mouth at bedtime as needed for allergies. ) 14 tablet 0  . dofetilide (TIKOSYN) 250 MCG capsule Take 1 capsule (250 mcg total) by mouth 2 (two) times daily. 60 capsule 5  . fluticasone (FLONASE) 50 MCG/ACT nasal spray instill 1 spray into each nostril twice a day (Patient taking differently: Place 1 spray into both nostrils 2 (two) times daily as needed for allergies. ) 16 g 5  . glucose blood test strip Use as instructed 100 each 12  . insulin aspart (NOVOLOG FLEXPEN) 100 UNIT/ML FlexPen Inject 5-8 Units into the skin See admin instructions. Inject 5-8 units SQ before supper 15 mL 3  . Insulin Glargine (BASAGLAR KWIKPEN) 100 UNIT/ML SOPN Inject 0.4 mLs (40 Units total) into the skin daily.    . Insulin Pen Needle (B-D ULTRAFINE III SHORT PEN) 31G X 8 MM MISC Use  as directed up to 4 times daily to check blood sugar 100 each 9  . levalbuterol (XOPENEX HFA) 45 MCG/ACT inhaler INHALE 1 TO 2 PUFFS BY MOUTH INTO THE LUNGS EVERY 4 HOURS IF NEEDED FOR WHEEZING 15 Inhaler 5  . levalbuterol (XOPENEX) 0.63 MG/3ML nebulizer solution One vial in nebulizer four times daily as needed 360 mL 5  . liraglutide (VICTOZA) 18 MG/3ML SOPN Inject 0.3 mLs (1.8 mg total) into the skin daily. 3 pen 3  . montelukast (SINGULAIR) 10 MG tablet TAKE 1 TABLET BY MOUTH AT BEDTIME 30 tablet 5  . polyethylene glycol powder (GLYCOLAX/MIRALAX) powder take 17GM (DISSOLVED IN WATER) by mouth once daily THREE TIMES A WEEK if needed as directed (Patient taking differently: Mix 17 g in liquid and drink 3 times weekly) 500 g 3  . potassium chloride SA (K-DUR,KLOR-CON) 20 MEQ tablet Take 1 tablet (20 mEq total) by mouth daily as directed. (Patient taking differently: Take 20 mEq by mouth daily as needed (with Torsemide). ) 30 tablet 11  . PRODIGY LANCETS 28G MISC 1  Units by Does not apply route 4 (four) times daily. 100 each 4  . ranitidine (ZANTAC) 150 MG tablet TAKE 1 TABLET(150 MG) BY MOUTH DAILY 30 tablet 5  . rosuvastatin (CRESTOR) 10 MG tablet Take 1 tablet (10 mg total) by mouth daily. 90 tablet 2  . torsemide (DEMADEX) 20 MG tablet Take 20 mg by mouth daily.    Marland Kitchen warfarin (COUMADIN) 5 MG tablet TAKE 1 TO 1.5 TABLETS BY MOUTH ONCE DAILY AS DIRECTED BY COUMADIN CLINIC 135 tablet 0  . torsemide (DEMADEX) 20 MG tablet TAKE 1 TABLET BY MOUTH TWICE A DAY AS DIRECTED (Patient taking differently: Take 20 mg by mouth daily. ) 60 tablet 3   No facility-administered medications prior to visit.      Allergies:   Avelox [moxifloxacin hcl in nacl]; Nsaids; Simvastatin; Moxifloxacin; Ace inhibitors; Jardiance [empagliflozin]; and Latex   Social History   Socioeconomic History  . Marital status: Widowed    Spouse name: Not on file  . Number of children: Not on file  . Years of education: Not on file  . Highest education level: Not on file  Occupational History  . Not on file  Social Needs  . Financial resource strain: Not on file  . Food insecurity:    Worry: Not on file    Inability: Not on file  . Transportation needs:    Medical: Not on file    Non-medical: Not on file  Tobacco Use  . Smoking status: Never Smoker  . Smokeless tobacco: Never Used  Substance and Sexual Activity  . Alcohol use: No  . Drug use: No  . Sexual activity: Never  Lifestyle  . Physical activity:    Days per week: Not on file    Minutes per session: Not on file  . Stress: Not on file  Relationships  . Social connections:    Talks on phone: Not on file    Gets together: Not on file    Attends religious service: Not on file    Active member of club or organization: Not on file    Attends meetings of clubs or organizations: Not on file    Relationship status: Not on file  Other Topics Concern  . Not on file  Social History Narrative   Not employed   Exercise-  walking 1 hour daily.    Diet- eating healthy diet.       Blain  Pulmonary:   She is originally from [facility]. Has always lived in [place]. 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 Currently has a small dog. No bird or mold exposure.       Current Social History 04/08/2017        Patient lives with daughter, Lafonda Mosses, in two level home 04/08/2017   Transportation: Patient has own vehicle and drives herself 04/08/2017   Important Relationships Daughter, Lafonda Mosses 04/08/2017    Pets: Shiatzu Evaristo Bury)  04/08/2017   Education / Work:  10 th grade/ None 04/08/2017   Interests / Fun: Read, do puzzles 04/08/2017   Current Stressors: None because she prays to God 04/08/2017   Religious / Personal Beliefs: Non-Denominational 04/08/2017   L. Ducatte, RN, BSN                                                                                                   Family History:  The patient's family history includes Diabetes in her brother and father; Hypertension in her brother and father.   ROS:   Please see the history of present illness.    ROS All other systems are reviewed and are negative  PHYSICAL EXAM:   VS:  BP 126/70 (BP Location: Right Arm, Patient Position: Sitting, Cuff Size: Large)   Pulse 93   Ht 5\' 6"  (1.676 m)   Wt 204 lb 9.6 oz (92.8 kg)   LMP 07/26/2011   BMI 33.02 kg/m      General: Alert, oriented x3, no distress, mildly obese Head: no evidence of trauma, PERRL, EOMI, no exophtalmos or lid lag, no myxedema, no xanthelasma; normal ears, nose and oropharynx Neck: normal jugular venous pulsations and no hepatojugular reflux; brisk carotid pulses without delay and no carotid bruits Chest: clear to auscultation, no signs of consolidation by percussion or palpation, normal fremitus, symmetrical and full respiratory excursions Cardiovascular: normal position and quality of the apical  impulse, regular rhythm, normal first and second heart sounds, no murmurs, rubs or gallops Abdomen: no tenderness or distention, no masses by palpation, no abnormal pulsatility or arterial bruits, normal bowel sounds, no hepatosplenomegaly Extremities: no clubbing, cyanosis or edema; 2+ radial, ulnar and brachial pulses bilaterally; 2+ right femoral, posterior tibial and dorsalis pedis pulses; 2+ left femoral, posterior tibial and dorsalis pedis pulses; no subclavian or femoral bruits Neurological: grossly nonfocal Psych: Normal mood and affect    Wt Readings from Last 3 Encounters:  01/05/18 204 lb 9.6 oz (92.8 kg)  12/28/17 204 lb 6.4 oz (92.7 kg)  12/08/17 207 lb (93.9 kg)      Studies/Labs Reviewed:   ECHO 01/01/2016 - Left ventricle: The cavity size was normal. Wall thickness was normal. Systolic function was normal. The estimated ejection   fraction was in the range of 50% to 55%. Mild hypokinesis of the basalinferior myocardium. Features are consistent with a   pseudonormal left ventricular filling pattern, with concomitant abnormal relaxation and increased filling pressure (grade 2   diastolic dysfunction). - Aortic valve:  Mildly calcified annulus. There was trivial regurgitation. - Pulmonary arteries: Systolic pressure was mildly to moderately increased. PA peak pressure: 39 mm Hg (S).  EKG:  EKG is not ordered today.  Previous EKG September 28, 2017 shows  atrial paced ventricular sensed rhythm, normal QRS and repolarization pattern. QTc 486 ms (on dofetilide) Recent Labs: 09/17/2017: Hemoglobin 11.9; Platelets 219 12/08/2017: BUN 20; Creatinine, Ser 1.27; Potassium 4.3; Sodium 138   Lipid Panel    Component Value Date/Time   CHOL 106 04/21/2016 1438   TRIG 73 04/21/2016 1438   HDL 44 (L) 04/21/2016 1438   CHOLHDL 2.4 04/21/2016 1438   VLDL 15 04/21/2016 1438   LDLCALC 47 04/21/2016 1438   LDLDIRECT 94 12/07/2007 2019     ASSESSMENT:    1. Ventricular tachycardia (HCC)    2. Paroxysmal atrial fibrillation (HCC)   3. Chronic diastolic heart failure (HCC)   4. ICD (implantable cardioverter-defibrillator) in place   5. Long term (current) use of anticoagulants   6. Essential hypertension   7. Mixed hyperlipidemia   8. Mild obesity   9. OSA (obstructive sleep apnea)   10. Encounter for monitoring dofetilide therapy   11. Uncomplicated asthma, unspecified asthma severity, unspecified whether persistent   12. Diabetes mellitus type 2 in obese (HCC)   13. CKD (chronic kidney disease) stage 3, GFR 30-59 ml/min (HCC)   14. Medication management    1. VT: Despite marked improvement in left ventricular systolic function she has had ventricular tachycardia that required device intervention.  She is also at risk of developing torsades while in treatment with dofetilide.  ICD is appropriate. 2. PAF: At least from a symptomatic point of view her arrhythmia is well controlled with dofetilide.  Overall burden has recently is at the lowest level in years of only 1.6% .  As before, ventricular rate control is not perfect, but is acceptable, especially since the episodes of arrhythmia or so brief. 3. CHF: Clinically appears to be euvolemic.  NYHA functional class I-2 stable optivol.  She has lost some true weight and we will have to reassess her "dry weight" which is not clearly less than 210 pounds.  No changes in medications recommended. 4. ICD: Well-healed after recent generator change out. 5. Warfarin anticoagulation: Most recent INR 2.2, in therapeutic range.  Without bleeding complications or history of embolic stroke/TIA. CHADVasc 4 (CHF, gender, HTN, DM).  INR in therapeutic range today 6. HTN: Well-controlled 7. HLP: On statin, all parameters at target 8. Obesity: Congratulated on some success with weight loss, remains mildly obese 9. OSA: 100% compliant with CPAP and has benefit from this therapy by her report 10. Tikosyn: Routine interval in the desirable range.   Reminded her that she needs labs to PCP performed at least every 6 months.  Also reminded her about the potential for numerous drug interactions and the fact that she should always ask providers and pharmacists to check that before new prescriptions are administered.. 11. Asthma: Has been better on bisoprolol and less selective beta-blockers in the past 12. DM on long-term insulin therapy.  Reports improvement in control since starting Victoza 13. CKD: Recheck labs at least every 6 months while on dofetilide.  Most recent creatinine clearance was around 50 mL/min   For some reason her insurance charges more for 10 mg bisoprolol tabs (nonformulary) than it does for 25 mg.  PLAN:  In order of problems listed above:  Fascinating. Have not seen one there before. Medication Adjustments/Labs and Tests Ordered:  Current medicines are reviewed at length with the patient today.  Concerns regarding medicines are outlined above.  Medication changes, Labs and Tests ordered today are listed in the Patient Instructions below. Patient Instructions  Dr Royann Shivers recommends that you continue on your current medications as directed. Please refer to the Current Medication list given to you today.  Your physician recommends that you return for lab work in February 2020.  Remote monitoring is used to monitor your Pacemaker or ICD from home. This monitoring reduces the number of office visits required to check your device to one time per year. It allows [place] to keep an eye on the functioning of your device to ensure it is working properly. You are scheduled for a device check from home on Monday, October 28th, 2019. You may send your transmission at any time that day. If you have a wireless device, the transmission will be sent automatically. After your physician reviews your transmission, you will receive a postcard with your next transmission date.  To improve our patient care and to more adequately follow your  device, [facility] has decided, as a practice, to start following each patient four times a year with your home monitor. This means that you may experience a remote appointment that is close to an in-office appointment with your physician. Your insurance will apply at the same rate as other remote monitoring transmissions.  Dr Royann Shivers recommends that you schedule a follow-up appointment in 6 months with an ICD check. You will receive a reminder letter in the mail two months in advance. If you don't receive a letter, please call our office to schedule the follow-up appointment.  If you need a refill on your cardiac medications before your next appointment, please call your pharmacy.      Signed, Thurmon Fair, MD  01/07/2018 4:22 PM    [facility] [address] [place], [place], [place]  [address] Phone: [phone]; Fax: [phone]

## 2018-01-07 ENCOUNTER — Encounter: Payer: Self-pay | Admitting: Cardiovascular Disease

## 2018-01-07 DIAGNOSIS — I5042 Chronic combined systolic (congestive) and diastolic (congestive) heart failure: Secondary | ICD-10-CM | POA: Insufficient documentation

## 2018-01-07 DIAGNOSIS — E669 Obesity, unspecified: Secondary | ICD-10-CM | POA: Insufficient documentation

## 2018-01-07 DIAGNOSIS — I5032 Chronic diastolic (congestive) heart failure: Secondary | ICD-10-CM | POA: Insufficient documentation

## 2018-01-07 HISTORY — DX: Chronic diastolic (congestive) heart failure: I50.32

## 2018-01-28 LAB — CUP PACEART INCLINIC DEVICE CHECK
Battery Remaining Longevity: 109 mo
Brady Statistic AP VP Percent: 0.5 %
Brady Statistic AP VS Percent: 96 %
Brady Statistic AS VP Percent: 0.1 % — CL
Brady Statistic AS VS Percent: 3.5 %
Date Time Interrogation Session: 20191018161017
HighPow Impedance: 71 Ohm
Implantable Lead Implant Date: 20120120
Implantable Lead Implant Date: 20120120
Implantable Lead Location: 753859
Implantable Lead Location: 753860
Implantable Lead Model: 5076
Implantable Lead Model: 6935
Implantable Pulse Generator Implant Date: 20190618
Lead Channel Impedance Value: 399 Ohm
Lead Channel Impedance Value: 456 Ohm
Lead Channel Pacing Threshold Amplitude: 0.5 V
Lead Channel Pacing Threshold Amplitude: 0.75 V
Lead Channel Pacing Threshold Pulse Width: 0.4 ms
Lead Channel Pacing Threshold Pulse Width: 0.4 ms
Lead Channel Sensing Intrinsic Amplitude: 1.1 mV
Lead Channel Sensing Intrinsic Amplitude: 15.1 mV
Lead Channel Setting Pacing Amplitude: 2 V
Lead Channel Setting Pacing Amplitude: 2.5 V
Lead Channel Setting Pacing Pulse Width: 0.4 ms
Lead Channel Setting Sensing Sensitivity: 0.3 mV

## 2018-01-31 ENCOUNTER — Ambulatory Visit (INDEPENDENT_AMBULATORY_CARE_PROVIDER_SITE_OTHER): Payer: Medicare Other | Admitting: Pharmacist Clinician (PhC)/ Clinical Pharmacy Specialist

## 2018-01-31 DIAGNOSIS — I480 Paroxysmal atrial fibrillation: Secondary | ICD-10-CM

## 2018-01-31 DIAGNOSIS — Z7901 Long term (current) use of anticoagulants: Secondary | ICD-10-CM

## 2018-01-31 DIAGNOSIS — I4891 Unspecified atrial fibrillation: Secondary | ICD-10-CM | POA: Diagnosis not present

## 2018-01-31 LAB — POCT INR: INR: 2 (ref 2.0–3.0)

## 2018-02-05 ENCOUNTER — Other Ambulatory Visit: Payer: Self-pay | Admitting: Cardiovascular Disease

## 2018-02-07 ENCOUNTER — Ambulatory Visit (INDEPENDENT_AMBULATORY_CARE_PROVIDER_SITE_OTHER): Payer: Medicare Other

## 2018-02-07 DIAGNOSIS — I5032 Chronic diastolic (congestive) heart failure: Secondary | ICD-10-CM | POA: Diagnosis not present

## 2018-02-07 DIAGNOSIS — Z95810 Presence of automatic (implantable) cardiac defibrillator: Secondary | ICD-10-CM

## 2018-02-09 ENCOUNTER — Telehealth: Payer: Self-pay

## 2018-02-09 NOTE — Progress Notes (Addendum)
Spoke with patient. Transmission reviewed.  She had teeth pulled and eating soup mos of the time.  Reminded her that canned soup will be high in sodium.  She will cut back on the soup and mouth is healing well.  Weight stable at 206 lbs. No fluid symptoms and encouraged to call if she has any symptoms.  Next remote transmission 03/14/2018

## 2018-02-09 NOTE — Progress Notes (Signed)
EPIC Encounter for ICM Monitoring  Patient Name: [name] is a 67 y.o. female Date: 02/09/2018 Primary Care Physican: Sela Hilding, MD Primary Cardiologist:Croitoru Electrophysiologist: Croitoru Dry Weight:Previous weight 206lbs         Attempted call to patient and unable to reach.  Left detailed message, per DPR, regarding transmission.  Transmission reviewed.    Thoracic impedance abnormal suggesting fluid accumulation but has trended close to baseline.   Prescribed: Torsemide 20 mg 1 tablet twice daily. Potassium 20 mEq 1 tablet daily  LABS: 12/08/2017 Creatinine 1.27. BUN 20, Potassium 4.3, Sodium 138, eGFR 44-51 09/17/2017 Creatinine 0.95, BUN 12, Potassium 4.2, Sodium 140, EGFR 63-72 07/23/2017 Creatinine 1.16, BUN 16, Potassium 3.8, Sodium 136, EGFR 48-56  Recommendations: Left voice mail with ICM number and encouraged to call if experiencing any fluid symptoms.  Follow-up plan: ICM clinic phone appointment on 03/14/2018.    Copy of ICM check sent to Dr. Sallyanne Kuster.   3 month ICM trend: 02/07/2018    1 Year ICM trend:       [name], RN 02/09/2018 7:57 AM

## 2018-02-09 NOTE — Telephone Encounter (Signed)
Remote ICM transmission received.  Attempted call to patient regarding ICM remote transmission and left detailed message, per DPR, with next ICM remote transmission date of 03/14/2018.  Advised to return call for any fluid symptoms or questions.    

## 2018-02-27 ENCOUNTER — Other Ambulatory Visit: Payer: Self-pay | Admitting: Family Medicine

## 2018-03-01 ENCOUNTER — Encounter: Payer: Self-pay | Admitting: Sports Medicine

## 2018-03-01 ENCOUNTER — Ambulatory Visit (INDEPENDENT_AMBULATORY_CARE_PROVIDER_SITE_OTHER): Payer: Medicare Other | Admitting: Sports Medicine

## 2018-03-01 DIAGNOSIS — E0842 Diabetes mellitus due to underlying condition with diabetic polyneuropathy: Secondary | ICD-10-CM

## 2018-03-01 DIAGNOSIS — L84 Corns and callosities: Secondary | ICD-10-CM | POA: Diagnosis not present

## 2018-03-01 DIAGNOSIS — B351 Tinea unguium: Secondary | ICD-10-CM

## 2018-03-01 DIAGNOSIS — I739 Peripheral vascular disease, unspecified: Secondary | ICD-10-CM

## 2018-03-01 DIAGNOSIS — M79609 Pain in unspecified limb: Principal | ICD-10-CM

## 2018-03-01 DIAGNOSIS — M79676 Pain in unspecified toe(s): Secondary | ICD-10-CM

## 2018-03-01 DIAGNOSIS — E1140 Type 2 diabetes mellitus with diabetic neuropathy, unspecified: Secondary | ICD-10-CM

## 2018-03-01 DIAGNOSIS — Z794 Long term (current) use of insulin: Secondary | ICD-10-CM

## 2018-03-01 NOTE — Progress Notes (Signed)
Subjective: [name] is a 67 y.o. female patient with history of diabetes who presents to office today complaining of long,mildly painful nails  while ambulating in shoes; unable to trim. Patient states that the glucose reading this morning was not recorded still on Victoza. No new issues.   FBS 132, A1c ~8.   Patient Active Problem List   Diagnosis Date Noted  . Chronic diastolic heart failure (HCC) 01/07/2018  . Mild obesity 01/07/2018  . ICD (implantable cardioverter-defibrillator) battery depletion 09/28/2017  . Arthritis of carpometacarpal (CMC) joint of thumb 03/26/2016  . Acute idiopathic gout of right wrist 03/26/2016  . Primary localized osteoarthritis of right knee   . Visit for monitoring Tikosyn therapy 08/21/2015  . Long term current use of anticoagulant therapy 08/21/2015  . Preoperative cardiovascular examination 08/21/2015  . Ventricular tachycardia (HCC) 05/22/2015  . Obesity (BMI 30-39.9) 01/04/2014  . ICD (St. Jude Protecta dual-chamber),secondary prevention (VF arrest) January 2012 11/19/2012  . CKD (chronic kidney disease) stage 2, GFR 60-89 ml/min 02/22/2012  . Asthma, moderate persistent 12/10/2011  . Health maintenance examination 11/02/2011  . Diabetic neuropathy (HCC) 09/18/2011  . Congestive heart failure (HCC) 02/18/2011  . Chronic systolic heart failure (HCC) 05/09/2010  . OSTEOARTHRITIS, KNEE 08/28/2008  . DISEASE, VOCAL CORD NEC 10/13/2006  . Type 2 diabetes, uncontrolled, with neuropathy (HCC) 06/10/2006  . HYPERCHOLESTEROLEMIA 06/10/2006  . Essential hypertension 06/10/2006  . Atrial fibrillation (HCC) 06/10/2006  . OSA on CPAP 06/10/2006   Current Outpatient Medications on File Prior to Visit  Medication Sig Dispense Refill  . bisoprolol (ZEBETA) 5 MG tablet TAKE 2 TABLETS BY MOUTH ONCE DAILY 60 tablet 3  . budesonide-formoterol (SYMBICORT) 160-4.5 MCG/ACT inhaler Inhale 2 puffs into the lungs 2 (two) times daily as needed. 1 Inhaler 3  .  buPROPion (WELLBUTRIN XL) 150 MG 24 hr tablet TAKE 1 TABLET BY MOUTH ONCE DAILY 90 tablet 0  . chlorpheniramine (CHLOR-TRIMETON) 4 MG tablet 8mg  at night (Patient taking differently: Take 8 mg by mouth at bedtime as needed for allergies. ) 14 tablet 0  . dofetilide (TIKOSYN) 250 MCG capsule Take 1 capsule (250 mcg total) by mouth 2 (two) times daily. 60 capsule 5  . esomeprazole (NEXIUM) 40 MG capsule Take by mouth.    . fluticasone (FLONASE) 50 MCG/ACT nasal spray instill 1 spray into each nostril twice a day (Patient taking differently: Place 1 spray into both nostrils 2 (two) times daily as needed for allergies. ) 16 g 5  . glucose blood test strip Use as instructed 100 each 12  . HYDROcodone-acetaminophen (NORCO) 7.5-325 MG tablet Take 1 tablet by mouth every 6 (six) hours as needed.  0  . HYDROcodone-acetaminophen (NORCO/VICODIN) 5-325 MG tablet TK 1 T PO Q 4 H PRN FOR PAIN  0  . insulin aspart (NOVOLOG FLEXPEN) 100 UNIT/ML FlexPen Inject 5-8 Units into the skin See admin instructions. Inject 5-8 units SQ before supper 15 mL 3  . Insulin Glargine (BASAGLAR KWIKPEN) 100 UNIT/ML SOPN Inject 0.4 mLs (40 Units total) into the skin daily.    . Insulin Pen Needle (B-D ULTRAFINE III SHORT PEN) 31G X 8 MM MISC Use as directed up to 4 times daily to check blood sugar 100 each 9  . levalbuterol (XOPENEX HFA) 45 MCG/ACT inhaler INHALE 1 TO 2 PUFFS BY MOUTH INTO THE LUNGS EVERY 4 HOURS IF NEEDED FOR WHEEZING 15 Inhaler 5  . levalbuterol (XOPENEX) 0.63 MG/3ML nebulizer solution One vial in nebulizer four times daily as  needed 360 mL 5  . liraglutide (VICTOZA) 18 MG/3ML SOPN Inject 0.3 mLs (1.8 mg total) into the skin daily. 3 pen 3  . montelukast (SINGULAIR) 10 MG tablet TAKE 1 TABLET BY MOUTH AT BEDTIME 30 tablet 5  . polyethylene glycol powder (GLYCOLAX/MIRALAX) powder take 17GM (DISSOLVED IN WATER) by mouth once daily THREE TIMES A WEEK if needed as directed (Patient taking differently: Mix 17 g in liquid  and drink 3 times weekly) 500 g 3  . potassium chloride SA (K-DUR,KLOR-CON) 20 MEQ tablet Take 1 tablet (20 mEq total) by mouth daily as directed. (Patient taking differently: Take 20 mEq by mouth daily as needed (with Torsemide). ) 30 tablet 11  . PRODIGY LANCETS 28G MISC 1 Units by Does not apply route 4 (four) times daily. 100 each 4  . ranitidine (ZANTAC) 150 MG tablet TAKE 1 TABLET(150 MG) BY MOUTH DAILY 30 tablet 5  . rosuvastatin (CRESTOR) 10 MG tablet Take 1 tablet (10 mg total) by mouth daily. 90 tablet 2  . torsemide (DEMADEX) 20 MG tablet Take 20 mg by mouth daily.    Marland Kitchen UNABLE TO FIND Inhale into the lungs.    . warfarin (COUMADIN) 5 MG tablet TAKE 1 TO 1 AND 1/2 TABLETS BY MOUTH EVERY DAY AS DIRECTED BY COUMADIN CLINIC 135 tablet 0   No current facility-administered medications on file prior to visit.    Allergies  Allergen Reactions  . Avelox [Moxifloxacin Hcl In Nacl] Other (See Comments)    Cardiac arrest per pt  . Nsaids Other (See Comments)    Cardiac arrest per pt Cardiac arrest per pt  . Simvastatin Other (See Comments)    myalgias myalgias myalgias  . Moxifloxacin Other (See Comments)  . Ace Inhibitors Other (See Comments) and Cough    Dry cough  Dry cough  Dry cough   . Jardiance [Empagliflozin] Other (See Comments)    Felt "crazy", fatigue, sweating, denies hypoglycemia while taking  . Latex Rash    Recent Results (from the past 2160 hour(s))  Basic metabolic panel     Status: Abnormal   Collection Time: 12/08/17  8:55 AM  Result Value Ref Range   Glucose 165 (H) 65 - 99 mg/dL   BUN 20 8 - 27 mg/dL   Creatinine, Ser 9.60 (H) 0.57 - 1.00 mg/dL   GFR calc non Af Amer 44 (L) >59 mL/min/1.73   GFR calc Af Amer 51 (L) >59 mL/min/1.73   BUN/Creatinine Ratio 16 12 - 28   Sodium 138 134 - 144 mmol/L   Potassium 4.3 3.5 - 5.2 mmol/L   Chloride 100 96 - 106 mmol/L   CO2 25 20 - 29 mmol/L   Calcium 8.6 (L) 8.7 - 10.3 mg/dL  Hemoglobin A5W     Status: Abnormal    Collection Time: 12/08/17  8:55 AM  Result Value Ref Range   Hgb A1c MFr Bld 8.1 (H) 4.8 - 5.6 %    Comment:          Prediabetes: 5.7 - 6.4          Diabetes: >6.4          Glycemic control for adults with diabetes: <7.0    Est. average glucose Bld gHb Est-mCnc 186 mg/dL  POCT INR     Status: None   Collection Time: 12/20/17  9:18 AM  Result Value Ref Range   INR 2.2 2.0 - 3.0  CUP PACEART INCLINIC DEVICE CHECK     Status:  Abnormal   Collection Time: 01/05/18  5:03 PM  Result Value Ref Range   Pulse Generator Manufacturer MERM    Date Time Interrogation Session [id number]    Pulse Gen Model [id number] Evera MRI XT DR    Pulse Gen Serial Number [id number]    Clinic Name [facility]    Implantable Pulse Generator Type Implantable Cardiac Defibulator    Implantable Pulse Generator Implant Date [id number]    Implantable Lead Manufacturer [facility]    Implantable Lead Model 5076 CapSureFix Novus    Implantable Lead Serial Number [id number]    Implantable Lead Implant Date [id number]    Implantable Lead Location [id number]    Implantable Lead Manufacturer [facility]    Implantable Lead Model 6935 Sprint Quattro Secure S    Implantable Lead Serial Number [id number]    Implantable Lead Implant Date [id number]    Implantable Lead Location [id number]    Lead Channel Setting Sensing Sensitivity 0.3 mV   Lead Channel Setting Pacing Amplitude 2 V   Lead Channel Setting Pacing Pulse Width 0.4 ms   Lead Channel Setting Pacing Amplitude 2.5 V   Lead Channel Impedance Value 399 ohm   Lead Channel Sensing Intrinsic Amplitude 1.1 mV   Lead Channel Pacing Threshold Amplitude 0.5 V   Lead Channel Pacing Threshold Pulse Width 0.4 ms   Lead Channel Impedance Value 456 ohm   Lead Channel Sensing Intrinsic Amplitude 15.1 mV   Lead Channel Pacing Threshold Amplitude 0.75 V   Lead Channel Pacing Threshold Pulse Width 0.4 ms   HighPow Impedance 71 ohm   Battery Remaining Longevity 109 mo   Brady Statistic AP VP  Percent 0.5 %   Brady Statistic AS VP Percent 0.1 (<) %   Brady Statistic AP VS Percent 96.0 %   Brady Statistic AS VS Percent 3.5 %   Eval Rhythm SB 53   POCT INR     Status: None   Collection Time: 01/31/18  9:13 AM  Result Value Ref Range   INR 2.0 2.0 - 3.0    Objective: General: Patient is awake, alert, and oriented x 3 and in no acute distress.  Integument: Skin is warm, dry and supple bilateral. Nails are tender, long, thickened and  dystrophic with subungual debris, consistent with onychomycosis, 1-5 bilateral. No signs of infection. + callus hallux, sub 3-4, and 5th toes bilateral. Remaining integument unremarkable.  Vasculature:  Dorsalis Pedis pulse 1/4 bilateral. Posterior Tibial pulse  1/4 bilateral.  Capillary fill time <3 sec 1-5 bilateral. Scant hair growth to the level of the digits. Temperature gradient within normal limits. No varicosities present bilateral. No edema present bilateral.   Neurology: The patient has intact sensation measured with a 5.07/10g Semmes Weinstein Monofilament at all pedal sites bilateral . Vibratory sensation diminished bilateral with tuning fork. No Babinski sign present bilateral.   Musculoskeletal:  Asymptomatic pes planus, bunion and hammertoe pedal deformities noted bilateral. Muscular strength 5/5 in all lower extremity muscular groups bilateral without pain on range of motion . No tenderness with calf compression bilateral.  Assessment and Plan: Problem List Items Addressed This Visit      Endocrine   Diabetic neuropathy (HCC) (Chronic)    Other Visit Diagnoses    Pain due to onychomycosis of nail    -  Primary   Corns and callosities       Peripheral arterial disease (HCC)       Type 2 diabetes mellitus with diabetic neuropathy, with long-term current use of insulin (  HCC)         -Examined patient. -Discussed and educated patient on diabetic foot care, especially with  regards to the vascular, neurological and  musculoskeletal systems.  -Mechanically debrided callus >5 using sterile chisel blade and debrided all nails 1-5 bilateral using sterile nail nipper and filed with dremel without incident  -Continue with diabetic shoes and insoles as previous  -Patient to return  in 3 months for at risk foot care -Patient advised to call the office if any problems or questions arise in the meantime.  Asencion Islam, DPM

## 2018-03-04 ENCOUNTER — Other Ambulatory Visit: Payer: Self-pay | Admitting: Cardiovascular Disease

## 2018-03-04 ENCOUNTER — Other Ambulatory Visit: Payer: Self-pay | Admitting: Family Medicine

## 2018-03-04 DIAGNOSIS — E1140 Type 2 diabetes mellitus with diabetic neuropathy, unspecified: Secondary | ICD-10-CM

## 2018-03-04 DIAGNOSIS — E1165 Type 2 diabetes mellitus with hyperglycemia: Principal | ICD-10-CM

## 2018-03-04 DIAGNOSIS — IMO0002 Reserved for concepts with insufficient information to code with codable children: Secondary | ICD-10-CM

## 2018-03-14 ENCOUNTER — Ambulatory Visit (INDEPENDENT_AMBULATORY_CARE_PROVIDER_SITE_OTHER): Payer: Medicare Other | Admitting: Pharmacist Clinician (PhC)/ Clinical Pharmacy Specialist

## 2018-03-14 ENCOUNTER — Ambulatory Visit (INDEPENDENT_AMBULATORY_CARE_PROVIDER_SITE_OTHER): Payer: Medicare Other

## 2018-03-14 ENCOUNTER — Other Ambulatory Visit: Payer: Self-pay | Admitting: Pharmacist Clinician (PhC)/ Clinical Pharmacy Specialist

## 2018-03-14 DIAGNOSIS — I5032 Chronic diastolic (congestive) heart failure: Secondary | ICD-10-CM

## 2018-03-14 DIAGNOSIS — I4891 Unspecified atrial fibrillation: Secondary | ICD-10-CM | POA: Diagnosis not present

## 2018-03-14 DIAGNOSIS — Z7901 Long term (current) use of anticoagulants: Secondary | ICD-10-CM

## 2018-03-14 DIAGNOSIS — Z95810 Presence of automatic (implantable) cardiac defibrillator: Secondary | ICD-10-CM

## 2018-03-14 LAB — POCT INR: INR: 2.3 (ref 2.0–3.0)

## 2018-03-14 MED ORDER — TORSEMIDE 20 MG PO TABS: 20.0000 mg | ORAL_TABLET | Freq: Every day | ORAL | 1 refills | Status: DC

## 2018-03-15 DIAGNOSIS — I5032 Chronic diastolic (congestive) heart failure: Secondary | ICD-10-CM

## 2018-03-15 DIAGNOSIS — Z95810 Presence of automatic (implantable) cardiac defibrillator: Secondary | ICD-10-CM | POA: Diagnosis not present

## 2018-03-15 NOTE — Progress Notes (Signed)
EPIC Encounter for ICM Monitoring  Patient Name: [name] is a 67 y.o. female Date: 03/15/2018 Primary Care Physican: Sela Hilding, MD Primary Cardiologist:Croitoru Electrophysiologist: Croitoru Last Weight:  206lbs Today's Weight: 199 lbs   Clinical Status (07-Feb-2018 to 14-Mar-2018)  Treated VT/VF 0 episodes   AT/AF 101 episodes   Time in AT/AF 0.4 hr/day (1.9%)  Longest AT/AF 102 minutes       Heart Failure questions reviewed, pt asymptomatic.   Thoracic impedance normal.   Prescribed: Torsemide 20 mg 1 tablet twice daily. Potassium 20 mEq 1 tablet daily  LABS: 12/08/2017 Creatinine 1.27. BUN 20, Potassium 4.3, Sodium 138, eGFR 44-51 09/17/2017 Creatinine 0.95, BUN 12, Potassium 4.2, Sodium 140, EGFR 63-72 07/23/2017 Creatinine 1.16, BUN 16, Potassium 3.8, Sodium 136, EGFR 48-56  Recommendations: No changes.  Encouraged to call for fluid symptoms.  Follow-up plan: ICM clinic phone appointment on 04/14/2018.    Copy of ICM check sent to Dr. Sallyanne Kuster.   3 month ICM trend: 03/14/2018    AT/AF    1 Year ICM trend:       [name], RN 03/15/2018 12:39 PM

## 2018-03-16 NOTE — Progress Notes (Signed)
Thank you MCr 

## 2018-03-23 ENCOUNTER — Other Ambulatory Visit: Payer: Self-pay

## 2018-03-23 ENCOUNTER — Encounter: Payer: Self-pay | Admitting: Family Medicine

## 2018-03-23 ENCOUNTER — Ambulatory Visit (INDEPENDENT_AMBULATORY_CARE_PROVIDER_SITE_OTHER): Payer: Medicare Other | Admitting: Family Medicine

## 2018-03-23 VITALS — BP 112/71 | HR 86 | Temp 98.2°F | Wt 202.0 lb

## 2018-03-23 DIAGNOSIS — IMO0002 Reserved for concepts with insufficient information to code with codable children: Secondary | ICD-10-CM

## 2018-03-23 DIAGNOSIS — E1140 Type 2 diabetes mellitus with diabetic neuropathy, unspecified: Secondary | ICD-10-CM | POA: Diagnosis not present

## 2018-03-23 DIAGNOSIS — M1711 Unilateral primary osteoarthritis, right knee: Secondary | ICD-10-CM | POA: Diagnosis not present

## 2018-03-23 DIAGNOSIS — E669 Obesity, unspecified: Secondary | ICD-10-CM | POA: Diagnosis not present

## 2018-03-23 DIAGNOSIS — K219 Gastro-esophageal reflux disease without esophagitis: Secondary | ICD-10-CM | POA: Diagnosis not present

## 2018-03-23 DIAGNOSIS — N182 Chronic kidney disease, stage 2 (mild): Secondary | ICD-10-CM | POA: Diagnosis not present

## 2018-03-23 DIAGNOSIS — E1165 Type 2 diabetes mellitus with hyperglycemia: Secondary | ICD-10-CM | POA: Diagnosis not present

## 2018-03-23 HISTORY — DX: Gastro-esophageal reflux disease without esophagitis: K21.9

## 2018-03-23 LAB — POCT GLYCOSYLATED HEMOGLOBIN (HGB A1C): HbA1c, POC (controlled diabetic range): 8 % — AB (ref 0.0–7.0)

## 2018-03-23 MED ORDER — FAMOTIDINE 20 MG PO TABS: 20.0000 mg | ORAL_TABLET | Freq: Every day | ORAL | 3 refills | Status: DC

## 2018-03-23 NOTE — Assessment & Plan Note (Signed)
Patient was taking Zantac from pulmonology prior to this.  Given multiple recalls for Zantac, will switch to famotidine 20 mg daily.

## 2018-03-23 NOTE — Assessment & Plan Note (Signed)
Recheck BMP today.  

## 2018-03-23 NOTE — Assessment & Plan Note (Signed)
Control slightly improved today, however patient has not been using her short acting appropriately.  Encouraged her to use 5 units of NovoLog with each large meal even if her sugars below 200.  Despite GFR around 45 at last visit, she may be a good candidate for Jardiance at next visit if creatinine is stable.  Recheck 3 months.

## 2018-03-23 NOTE — Assessment & Plan Note (Signed)
Patient is considering total knee replacement in the spring, encouraged her to continue to use Tylenol as needed.  She would not be a good candidate for NSAIDs given CKD and cardiac history.

## 2018-03-23 NOTE — Progress Notes (Signed)
   CC: DM  HPI  DM - using 40U basaglar, 5-8U novolog. No lows at all. CBG this morning was 140. Uses novolog if CBG over 200. Uses 5U if that happens. Has eye exam upcoming.   OA - Dr. Thurston Hole did her other knee, she wants to do the R knee this spring and feels this would help her walking more. Uses APAP PRN for pain.   Obesity - trying to moderate what she eats, has lost 5 pounds and is pleased about this.   ROS: Denies CP, SOB, abdominal pain, dysuria, changes in BMs.   CC, SH/smoking status, and VS noted  Objective: BP 112/71   Pulse 86   Temp 98.2 F (36.8 C) (Oral)   Wt 202 lb (91.6 kg)   LMP 07/26/2011   SpO2 99%   BMI 32.60 kg/m  Gen: NAD, alert, cooperative, and pleasant. HEENT: NCAT, EOMI, PERRL CV: RRR, no murmur Resp: CTAB, no wheezes, non-labored Ext: No edema, warm Neuro: Alert and oriented, Speech clear, No gross deficits  Assessment and plan:  GERD (gastroesophageal reflux disease) Patient was taking Zantac from pulmonology prior to this.  Given multiple recalls for Zantac, will switch to famotidine 20 mg daily.  Obesity (BMI 30-39.9) Patient has lost 5 pounds, she is encouraged by this.  Encouraged her to continue walking.  Osteoarthritis of right knee Patient is considering total knee replacement in the spring, encouraged her to continue to use Tylenol as needed.  She would not be a good candidate for NSAIDs given CKD and cardiac history.  Type 2 diabetes, uncontrolled, with neuropathy (HCC) Control slightly improved today, however patient has not been using her short acting appropriately.  Encouraged her to use 5 units of NovoLog with each large meal even if her sugars below 200.  Despite GFR around 45 at last visit, she may be a good candidate for Jardiance at next visit if creatinine is stable.  Recheck 3 months.  CKD (chronic kidney disease) stage 2, GFR 60-89 ml/min Recheck BMP today.    Orders Placed This Encounter  Procedures  . CBC  .  Basic metabolic panel  . HgB A1c    Meds ordered this encounter  Medications  . famotidine (PEPCID) 20 MG tablet    Sig: Take 1 tablet (20 mg total) by mouth daily.    Dispense:  90 tablet    Refill:  3     Loni Muse, MD, PGY3 03/23/2018 11:14 AM

## 2018-03-23 NOTE — Patient Instructions (Addendum)
It was a pleasure to see you today! Thank you for choosing [facility] for your primary care. [name] was seen for diabetes.   Our plans for today were:  Check your blood sugar before your largest meal of the day. If that blood sugar is >140, give yourself 5 units of the short acting (novolog). I think this will help [place] keep getting your A1c down a little farther.   Our fax number for the eye doctor to send their note: [phone].   Best,  Dr. Chanetta Marshall

## 2018-03-23 NOTE — Progress Notes (Deleted)
   CC: ***  HPI  DM -   ROS: ***Denies CP, SOB, abdominal pain, dysuria, changes in BMs.   CC, SH/smoking status, and VS noted  Objective: BP 112/71   Pulse 86   Temp 98.2 F (36.8 C) (Oral)   Wt 202 lb (91.6 kg)   LMP 07/26/2011   SpO2 99%   BMI 32.60 kg/m  Gen: NAD, alert, cooperative, and pleasant.*** HEENT: NCAT, EOMI, PERRL CV: RRR, no murmur Resp: CTAB, no wheezes, non-labored Abd: SNTND, BS present, no guarding or organomegaly Ext: No edema, warm Neuro: Alert and oriented, Speech clear, No gross deficits  Assessment and plan:  No problem-specific Assessment & Plan notes found for this encounter.   Orders Placed This Encounter  Procedures  . HgB A1c    No orders of the defined types were placed in this encounter.   Health Maintenance reviewed - {health maintenance:315237}.  Loni Muse, MD, PGY3 03/23/2018 10:39 AM

## 2018-03-23 NOTE — Assessment & Plan Note (Signed)
Patient has lost 5 pounds, she is encouraged by this.  Encouraged her to continue walking.

## 2018-03-24 ENCOUNTER — Telehealth: Payer: Self-pay | Admitting: *Deleted

## 2018-03-24 LAB — BASIC METABOLIC PANEL
BUN/Creatinine Ratio: 14 (ref 12–28)
BUN: 14 mg/dL (ref 8–27)
CHLORIDE: 103 mmol/L (ref 96–106)
CO2: 25 mmol/L (ref 20–29)
Calcium: 9 mg/dL (ref 8.7–10.3)
Creatinine, Ser: 0.98 mg/dL (ref 0.57–1.00)
GFR calc Af Amer: 70 mL/min/{1.73_m2} (ref >59)
GFR, EST NON AFRICAN AMERICAN: 60 mL/min/{1.73_m2} (ref >59)
GLUCOSE: 172 mg/dL — AB (ref 65–99)
POTASSIUM: 4.3 mmol/L (ref 3.5–5.2)
Sodium: 141 mmol/L (ref 134–144)

## 2018-03-24 LAB — CBC
Hematocrit: 34.5 % (ref 34.0–46.6)
Hemoglobin: 11.9 g/dL (ref 11.1–15.9)
MCH: 29.3 pg (ref 26.6–33.0)
MCHC: 34.5 g/dL (ref 31.5–35.7)
MCV: 85 fL (ref 79–97)
Platelets: 209 10*3/uL (ref 150–450)
RBC: 4.06 x10E6/uL (ref 3.77–5.28)
RDW: 12.2 % — ABNORMAL LOW (ref 12.3–15.4)
WBC: 5 10*3/uL (ref 3.4–10.8)

## 2018-03-24 NOTE — Telephone Encounter (Signed)
-----   Message from Garth Bigness, MD sent at 03/24/2018  8:16 AM EST ----- [facility] team, please let patient know that her labs look great. No need for changes.

## 2018-03-24 NOTE — Telephone Encounter (Signed)
Pt informed. Zimmerman Rumple, April D, CMA  

## 2018-04-14 ENCOUNTER — Ambulatory Visit (INDEPENDENT_AMBULATORY_CARE_PROVIDER_SITE_OTHER): Payer: Medicare HMO

## 2018-04-14 DIAGNOSIS — I5032 Chronic diastolic (congestive) heart failure: Secondary | ICD-10-CM | POA: Diagnosis not present

## 2018-04-14 DIAGNOSIS — Z95810 Presence of automatic (implantable) cardiac defibrillator: Secondary | ICD-10-CM

## 2018-04-15 NOTE — Progress Notes (Signed)
EPIC Encounter for ICM Monitoring  Patient Name: [name] is a 68 y.o. female Date: 04/15/2018 Primary Care Physican: Sela Hilding, MD Primary Cardiologist:Croitoru Electrophysiologist: Croitoru Last Weight: 199lbs Today's Weight: unknown      Transmission received.   Thoracic impedance normal.  Prescribed dosage: Torsemide 20 mg 1 tablet twice daily. Potassium 20 mEq 1 tablet daily  LABS: 12/08/2017 Creatinine 1.27. BUN 20, Potassium 4.3, Sodium 138, eGFR 44-51 09/17/2017 Creatinine 0.95, BUN 12, Potassium 4.2, Sodium 140, EGFR 63-72 07/23/2017 Creatinine 1.16, BUN 16, Potassium 3.8, Sodium 136, EGFR 48-56  Recommendations: None.  Follow-up plan: ICM clinic phone appointment on 05/16/2018.    Copy of ICM check sent to Dr. Sallyanne Kuster.   3 month ICM trend: 04/14/2018    1 Year ICM trend:       [name], RN 04/15/2018 9:13 AM

## 2018-05-02 ENCOUNTER — Other Ambulatory Visit: Payer: Self-pay

## 2018-05-02 ENCOUNTER — Ambulatory Visit (INDEPENDENT_AMBULATORY_CARE_PROVIDER_SITE_OTHER): Payer: Medicare HMO | Admitting: Pharmacist

## 2018-05-02 ENCOUNTER — Other Ambulatory Visit: Payer: Self-pay | Admitting: Cardiovascular Disease

## 2018-05-02 DIAGNOSIS — I4891 Unspecified atrial fibrillation: Secondary | ICD-10-CM | POA: Diagnosis not present

## 2018-05-02 DIAGNOSIS — Z7901 Long term (current) use of anticoagulants: Secondary | ICD-10-CM

## 2018-05-02 LAB — POCT INR: INR: 2.2 (ref 2.0–3.0)

## 2018-05-02 MED ORDER — WARFARIN SODIUM 5 MG PO TABS: ORAL_TABLET | ORAL | 1 refills | Status: DC

## 2018-05-02 NOTE — Telephone Encounter (Signed)
Rx has been sent to the pharmacy electronically. ° °

## 2018-05-13 DIAGNOSIS — G4733 Obstructive sleep apnea (adult) (pediatric): Secondary | ICD-10-CM | POA: Diagnosis not present

## 2018-05-16 ENCOUNTER — Ambulatory Visit (INDEPENDENT_AMBULATORY_CARE_PROVIDER_SITE_OTHER): Payer: Medicare HMO

## 2018-05-16 DIAGNOSIS — Z95810 Presence of automatic (implantable) cardiac defibrillator: Secondary | ICD-10-CM | POA: Diagnosis not present

## 2018-05-16 DIAGNOSIS — I5032 Chronic diastolic (congestive) heart failure: Secondary | ICD-10-CM

## 2018-05-17 NOTE — Progress Notes (Signed)
Looks great, thanks [employer]

## 2018-05-17 NOTE — Progress Notes (Signed)
EPIC Encounter for ICM Monitoring  Patient Name: [name] is a 68 y.o. female Date: 05/17/2018 Primary Care Physican: Sela Hilding, MD Primary Cardiologist:Croitoru Electrophysiologist: Croitoru Last Weight:199lbs Today's Weight:198 lbs                                                   Clinical Status (14-Apr-2018 to 16-May-2018)  Treated VT/VF 0 episodes   AT/AF 208 episodes   Time in AT/AF 0.7 hr/day (3.1%)   Longest AT/AF 39 minutes   Heart failure questions reviewed.  Patient asymptomatic.   Thoracic impedance normal.  Prescribed dosage: Torsemide 20 mg 1 tablet twice daily. Potassium 20 mEq 1 tablet daily  LABS: 03/23/2018 Creatinine 0.98, BUN 14, Potassium 4.3, Sodium 141, eGFR 60-70 12/08/2017 Creatinine 1.27. BUN 20, Potassium 4.3, Sodium 138, eGFR 44-51 09/17/2017 Creatinine 0.95, BUN 12, Potassium 4.2, Sodium 140, EGFR 63-72 07/23/2017 Creatinine 1.16, BUN 16, Potassium 3.8, Sodium 136, EGFR 48-56  Recommendations:  No changes.  Encouraged to call for fluid symptoms.   Follow-up plan: ICM clinic phone appointment on 06/20/2018.  Office appointment scheduled with Dr Sallyanne Kuster 07/19/2018.  Copy of ICM check sent to Dr. Sallyanne Kuster.  3 month ICM trend: 05/16/2018    1 Year ICM trend:       [name], RN 05/17/2018 3:35 PM

## 2018-05-23 DIAGNOSIS — Z79899 Other long term (current) drug therapy: Secondary | ICD-10-CM | POA: Diagnosis not present

## 2018-05-23 DIAGNOSIS — I472 Ventricular tachycardia: Secondary | ICD-10-CM | POA: Diagnosis not present

## 2018-05-24 ENCOUNTER — Other Ambulatory Visit: Payer: Self-pay | Admitting: Family Medicine

## 2018-05-24 DIAGNOSIS — E1165 Type 2 diabetes mellitus with hyperglycemia: Principal | ICD-10-CM

## 2018-05-24 DIAGNOSIS — E1140 Type 2 diabetes mellitus with diabetic neuropathy, unspecified: Secondary | ICD-10-CM

## 2018-05-24 DIAGNOSIS — IMO0002 Reserved for concepts with insufficient information to code with codable children: Secondary | ICD-10-CM

## 2018-05-24 LAB — BASIC METABOLIC PANEL
BUN/Creatinine Ratio: 15 (ref 12–28)
BUN: 17 mg/dL (ref 8–27)
CALCIUM: 8.7 mg/dL (ref 8.7–10.3)
CO2: 23 mmol/L (ref 20–29)
CREATININE: 1.17 mg/dL — AB (ref 0.57–1.00)
Chloride: 103 mmol/L (ref 96–106)
GFR calc Af Amer: 56 mL/min/{1.73_m2} — ABNORMAL LOW (ref >59)
GFR, EST NON AFRICAN AMERICAN: 48 mL/min/{1.73_m2} — AB (ref >59)
Glucose: 152 mg/dL — ABNORMAL HIGH (ref 65–99)
POTASSIUM: 4.5 mmol/L (ref 3.5–5.2)
Sodium: 141 mmol/L (ref 134–144)

## 2018-05-24 LAB — MAGNESIUM: Magnesium: 2 mg/dL (ref 1.6–2.3)

## 2018-05-30 ENCOUNTER — Other Ambulatory Visit: Payer: Self-pay

## 2018-05-30 MED ORDER — BUPROPION HCL ER (XL) 150 MG PO TB24: 150.0000 mg | ORAL_TABLET | Freq: Every day | ORAL | 0 refills | Status: DC

## 2018-05-31 ENCOUNTER — Encounter: Payer: Self-pay | Admitting: Sports Medicine

## 2018-05-31 ENCOUNTER — Ambulatory Visit (INDEPENDENT_AMBULATORY_CARE_PROVIDER_SITE_OTHER): Payer: Medicare HMO | Admitting: Sports Medicine

## 2018-05-31 DIAGNOSIS — M79676 Pain in unspecified toe(s): Secondary | ICD-10-CM

## 2018-05-31 DIAGNOSIS — B351 Tinea unguium: Secondary | ICD-10-CM | POA: Diagnosis not present

## 2018-05-31 DIAGNOSIS — L84 Corns and callosities: Secondary | ICD-10-CM

## 2018-05-31 DIAGNOSIS — E0842 Diabetes mellitus due to underlying condition with diabetic polyneuropathy: Secondary | ICD-10-CM

## 2018-05-31 DIAGNOSIS — Z794 Long term (current) use of insulin: Secondary | ICD-10-CM

## 2018-05-31 DIAGNOSIS — M79609 Pain in unspecified limb: Principal | ICD-10-CM

## 2018-05-31 DIAGNOSIS — I739 Peripheral vascular disease, unspecified: Secondary | ICD-10-CM

## 2018-05-31 DIAGNOSIS — E1140 Type 2 diabetes mellitus with diabetic neuropathy, unspecified: Secondary | ICD-10-CM

## 2018-05-31 NOTE — Progress Notes (Signed)
Subjective: [name] is a 68 y.o. female patient with history of diabetes who presents to office today complaining of long,mildly painful nails  while ambulating in shoes; unable to trim. Patient states that the glucose reading this morning was 112 still on Victoza. No new issues.   Last, A1c ~8. Next month will see PCP to have A1c checked again  Patient Active Problem List   Diagnosis Date Noted  . GERD (gastroesophageal reflux disease) 03/23/2018  . Chronic diastolic heart failure (HCC) 01/07/2018  . Mild obesity 01/07/2018  . Arthritis of carpometacarpal (CMC) joint of thumb 03/26/2016  . Primary localized osteoarthritis of right knee   . Visit for monitoring Tikosyn therapy 08/21/2015  . Long term current use of anticoagulant therapy 08/21/2015  . Ventricular tachycardia (HCC) 05/22/2015  . Obesity (BMI 30-39.9) 01/04/2014  . ICD (St. Jude Protecta dual-chamber),secondary prevention (VF arrest) January 2012 11/19/2012  . CKD (chronic kidney disease) stage 2, GFR 60-89 ml/min 02/22/2012  . Asthma, moderate persistent 12/10/2011  . Diabetic neuropathy (HCC) 09/18/2011  . Osteoarthritis of right knee 08/28/2008  . Type 2 diabetes, uncontrolled, with neuropathy (HCC) 06/10/2006  . HYPERCHOLESTEROLEMIA 06/10/2006  . Essential hypertension 06/10/2006  . Atrial fibrillation (HCC) 06/10/2006  . OSA on CPAP 06/10/2006   Current Outpatient Medications on File Prior to Visit  Medication Sig Dispense Refill  . bisoprolol (ZEBETA) 5 MG tablet TAKE 2 TABLETS BY MOUTH ONCE DAILY 60 tablet 5  . budesonide-formoterol (SYMBICORT) 160-4.5 MCG/ACT inhaler Inhale 2 puffs into the lungs 2 (two) times daily as needed. 1 Inhaler 3  . buPROPion (WELLBUTRIN XL) 150 MG 24 hr tablet Take 1 tablet (150 mg total) by mouth daily. 90 tablet 0  . dofetilide (TIKOSYN) 250 MCG capsule Take 1 capsule (250 mcg total) by mouth 2 (two) times daily. 60 capsule 5  . esomeprazole (NEXIUM) 40 MG capsule Take by  mouth.    . famotidine (PEPCID) 20 MG tablet Take 1 tablet (20 mg total) by mouth daily. 90 tablet 3  . fluticasone (FLONASE) 50 MCG/ACT nasal spray instill 1 spray into each nostril twice a day (Patient taking differently: Place 1 spray into both nostrils 2 (two) times daily as needed for allergies. ) 16 g 5  . glucose blood test strip Use as instructed 100 each 12  . insulin aspart (NOVOLOG FLEXPEN) 100 UNIT/ML FlexPen Inject 5-8 Units into the skin See admin instructions. Inject 5-8 units SQ before supper 15 mL 3  . Insulin Glargine (BASAGLAR KWIKPEN) 100 UNIT/ML SOPN Inject 0.4 mLs (40 Units total) into the skin daily.    . Insulin Pen Needle (B-D ULTRAFINE III SHORT PEN) 31G X 8 MM MISC Use as directed up to 4 times daily to check blood sugar 100 each 9  . levalbuterol (XOPENEX HFA) 45 MCG/ACT inhaler INHALE 1 TO 2 PUFFS BY MOUTH INTO THE LUNGS EVERY 4 HOURS IF NEEDED FOR WHEEZING 15 Inhaler 5  . levalbuterol (XOPENEX) 0.63 MG/3ML nebulizer solution One vial in nebulizer four times daily as needed 360 mL 5  . montelukast (SINGULAIR) 10 MG tablet TAKE 1 TABLET BY MOUTH AT BEDTIME 30 tablet 5  . polyethylene glycol powder (GLYCOLAX/MIRALAX) powder take 17GM (DISSOLVED IN WATER) by mouth once daily THREE TIMES A WEEK if needed as directed (Patient taking differently: Mix 17 g in liquid and drink 3 times weekly) 500 g 3  . potassium chloride SA (K-DUR,KLOR-CON) 20 MEQ tablet Take 1 tablet (20 mEq total) by mouth daily as directed. (  Patient taking differently: Take 20 mEq by mouth daily as needed (with Torsemide). ) 30 tablet 11  . PRODIGY LANCETS 28G MISC 1 Units by Does not apply route 4 (four) times daily. 100 each 4  . ranitidine (ZANTAC) 150 MG tablet     . rosuvastatin (CRESTOR) 10 MG tablet Take 1 tablet (10 mg total) by mouth daily. 90 tablet 2  . torsemide (DEMADEX) 20 MG tablet TAKE 1 TABLET BY MOUTH TWICE A DAY AS DIRECTED 180 tablet 1  . VICTOZA 18 MG/3ML SOPN ADMINISTER 1.8 MG UNDER THE  SKIN DAILY 9 mL 3  . warfarin (COUMADIN) 5 MG tablet Take 1 to 1 and 1/2 daily as directed by coumadin clinic 135 tablet 1   No current facility-administered medications on file prior to visit.    Allergies  Allergen Reactions  . Avelox [Moxifloxacin Hcl In Nacl] Other (See Comments)    Cardiac arrest per pt  . Nsaids Other (See Comments)    Cardiac arrest per pt Cardiac arrest per pt  . Simvastatin Other (See Comments)    myalgias myalgias myalgias  . Moxifloxacin Other (See Comments)  . Ace Inhibitors Other (See Comments) and Cough    Dry cough  Dry cough  Dry cough   . Jardiance [Empagliflozin] Other (See Comments)    Felt "crazy", fatigue, sweating, denies hypoglycemia while taking  . Latex Rash    Recent Results (from the past 2160 hour(s))  POCT INR     Status: None   Collection Time: 03/14/18  9:11 AM  Result Value Ref Range   INR 2.3 2.0 - 3.0  HgB A1c     Status: Abnormal   Collection Time: 03/23/18 10:25 AM  Result Value Ref Range   Hemoglobin A1C     HbA1c POC (<> result, manual entry)     HbA1c, POC (prediabetic range)     HbA1c, POC (controlled diabetic range) 8.0 (A) 0.0 - 7.0 %  CBC     Status: Abnormal   Collection Time: 03/23/18 11:04 AM  Result Value Ref Range   WBC 5.0 3.4 - 10.8 x10E3/uL   RBC 4.06 3.77 - 5.28 x10E6/uL   Hemoglobin 11.9 11.1 - 15.9 g/dL   Hematocrit 07.1 21.9 - 46.6 %   MCV 85 79 - 97 fL   MCH 29.3 26.6 - 33.0 pg   MCHC 34.5 31.5 - 35.7 g/dL   RDW 75.8 (L) 83.2 - 54.9 %    Comment: **Effective April 18, 2018, the RDW pediatric reference**   interval will be removed and the adult reference interval   will be changing to:                             Female 11.7 - 15.4                                                      Female 11.6 - 15.4    Platelets 209 150 - 450 x10E3/uL  Basic metabolic panel     Status: Abnormal   Collection Time: 03/23/18 11:04 AM  Result Value Ref Range   Glucose 172 (H) 65 - 99 mg/dL   BUN 14 8 - 27  mg/dL   Creatinine, Ser 8.26 0.57 - 1.00 mg/dL   GFR calc  non Af Amer 60 >59 mL/min/1.73   GFR calc Af Amer 70 >59 mL/min/1.73   BUN/Creatinine Ratio 14 12 - 28   Sodium 141 134 - 144 mmol/L   Potassium 4.3 3.5 - 5.2 mmol/L   Chloride 103 96 - 106 mmol/L   CO2 25 20 - 29 mmol/L   Calcium 9.0 8.7 - 10.3 mg/dL  POCT INR     Status: None   Collection Time: 05/02/18  9:05 AM  Result Value Ref Range   INR 2.2 2.0 - 3.0  Basic metabolic panel     Status: Abnormal   Collection Time: 05/23/18 12:06 PM  Result Value Ref Range   Glucose 152 (H) 65 - 99 mg/dL   BUN 17 8 - 27 mg/dL   Creatinine, Ser 8.10 (H) 0.57 - 1.00 mg/dL   GFR calc non Af Amer 48 (L) >59 mL/min/1.73   GFR calc Af Amer 56 (L) >59 mL/min/1.73   BUN/Creatinine Ratio 15 12 - 28   Sodium 141 134 - 144 mmol/L   Potassium 4.5 3.5 - 5.2 mmol/L   Chloride 103 96 - 106 mmol/L   CO2 23 20 - 29 mmol/L   Calcium 8.7 8.7 - 10.3 mg/dL  Magnesium     Status: None   Collection Time: 05/23/18 12:06 PM  Result Value Ref Range   Magnesium 2.0 1.6 - 2.3 mg/dL    Objective: General: Patient is awake, alert, and oriented x 3 and in no acute distress.  Integument: Skin is warm, dry and supple bilateral. Nails are tender, long, thickened and  dystrophic with subungual debris, consistent with onychomycosis, 1-5 bilateral. No signs of infection. + callus hallux, sub 3-4, and 5th toes bilateral. Remaining integument unremarkable.  Vasculature:  Dorsalis Pedis pulse 1/4 bilateral. Posterior Tibial pulse  1/4 bilateral.  Capillary fill time <3 sec 1-5 bilateral. Scant hair growth to the level of the digits. Temperature gradient within normal limits. No varicosities present bilateral. No edema present bilateral.   Neurology: The patient has intact sensation measured with a 5.07/10g Semmes Weinstein Monofilament at all pedal sites bilateral . Vibratory sensation diminished bilateral with tuning fork. No Babinski sign present bilateral.    Musculoskeletal:  Asymptomatic pes planus, bunion and hammertoe pedal deformities noted bilateral. Muscular strength 5/5 in all lower extremity muscular groups bilateral without pain on range of motion . No tenderness with calf compression bilateral.  Assessment and Plan: Problem List Items Addressed This Visit      Endocrine   Diabetic neuropathy (HCC) (Chronic)    Other Visit Diagnoses    Pain due to onychomycosis of nail    -  Primary   Corns and callosities       Peripheral arterial disease (HCC)       Type 2 diabetes mellitus with diabetic neuropathy, with long-term current use of insulin (HCC)         -Examined patient. -Discussed and educated patient on diabetic foot care, especially with  regards to the vascular, neurological and musculoskeletal systems.  -Mechanically debrided callus >5 using sterile chisel blade and debrided all nails 1-5 bilateral using sterile nail nipper and filed with dremel without incident  -Patient to return  in 3 months for at risk foot care -Patient advised to call the office if any problems or questions arise in the meantime.  Asencion Islam, DPM

## 2018-06-07 ENCOUNTER — Other Ambulatory Visit: Payer: Self-pay | Admitting: Cardiovascular Disease

## 2018-06-09 DIAGNOSIS — H35363 Drusen (degenerative) of macula, bilateral: Secondary | ICD-10-CM | POA: Diagnosis not present

## 2018-06-09 DIAGNOSIS — H31093 Other chorioretinal scars, bilateral: Secondary | ICD-10-CM | POA: Diagnosis not present

## 2018-06-09 DIAGNOSIS — H35411 Lattice degeneration of retina, right eye: Secondary | ICD-10-CM | POA: Diagnosis not present

## 2018-06-09 DIAGNOSIS — H43821 Vitreomacular adhesion, right eye: Secondary | ICD-10-CM | POA: Diagnosis not present

## 2018-06-13 ENCOUNTER — Ambulatory Visit (INDEPENDENT_AMBULATORY_CARE_PROVIDER_SITE_OTHER): Payer: Medicare HMO | Admitting: *Deleted

## 2018-06-13 DIAGNOSIS — I4891 Unspecified atrial fibrillation: Secondary | ICD-10-CM | POA: Diagnosis not present

## 2018-06-13 DIAGNOSIS — Z7901 Long term (current) use of anticoagulants: Secondary | ICD-10-CM | POA: Diagnosis not present

## 2018-06-13 LAB — POCT INR: INR: 2 (ref 2.0–3.0)

## 2018-06-13 NOTE — Patient Instructions (Signed)
Description   Today take 1.5 tablets, then Continue taking 1 tablet everyday except 1.5 tablets on Sundays.  Recheck INR in 6 weeks

## 2018-06-20 ENCOUNTER — Ambulatory Visit (INDEPENDENT_AMBULATORY_CARE_PROVIDER_SITE_OTHER): Payer: Medicare HMO

## 2018-06-20 DIAGNOSIS — I5032 Chronic diastolic (congestive) heart failure: Secondary | ICD-10-CM

## 2018-06-20 DIAGNOSIS — Z95810 Presence of automatic (implantable) cardiac defibrillator: Secondary | ICD-10-CM | POA: Diagnosis not present

## 2018-06-22 DIAGNOSIS — E1142 Type 2 diabetes mellitus with diabetic polyneuropathy: Secondary | ICD-10-CM | POA: Diagnosis not present

## 2018-06-22 DIAGNOSIS — E669 Obesity, unspecified: Secondary | ICD-10-CM | POA: Diagnosis not present

## 2018-06-22 DIAGNOSIS — R69 Illness, unspecified: Secondary | ICD-10-CM | POA: Diagnosis not present

## 2018-06-22 DIAGNOSIS — G4730 Sleep apnea, unspecified: Secondary | ICD-10-CM | POA: Diagnosis not present

## 2018-06-22 DIAGNOSIS — G8929 Other chronic pain: Secondary | ICD-10-CM | POA: Diagnosis not present

## 2018-06-22 DIAGNOSIS — Z008 Encounter for other general examination: Secondary | ICD-10-CM | POA: Diagnosis not present

## 2018-06-22 DIAGNOSIS — I4891 Unspecified atrial fibrillation: Secondary | ICD-10-CM | POA: Diagnosis not present

## 2018-06-22 DIAGNOSIS — Z794 Long term (current) use of insulin: Secondary | ICD-10-CM | POA: Diagnosis not present

## 2018-06-22 DIAGNOSIS — I509 Heart failure, unspecified: Secondary | ICD-10-CM | POA: Diagnosis not present

## 2018-06-22 DIAGNOSIS — E1159 Type 2 diabetes mellitus with other circulatory complications: Secondary | ICD-10-CM | POA: Diagnosis not present

## 2018-06-22 DIAGNOSIS — E785 Hyperlipidemia, unspecified: Secondary | ICD-10-CM | POA: Diagnosis not present

## 2018-06-22 NOTE — Progress Notes (Signed)
EPIC Encounter for ICM Monitoring  Patient Name: [name] is a 68 y.o. female Date: 06/22/2018 Primary Care Physican: Sela Hilding, MD Primary Cardiologist:Croitoru Electrophysiologist: Croitoru Last Weight:198lbs Today's Weight:198 lbs  Clinical Status (16-May-2018 to 20-Jun-2018)  Treated VT/VF 0 episodes   AT/AF 101 episodes   Time in AT/AF 0.4 hr/day (1.8%)   Longest AT/AF 116 minutes    Heart failure questions reviewed.  Patient asymptomatic.  Thoracic impedance normal.  Prescribed dosage:Torsemide 20 mg 1 tablet twice daily. Potassium 20 mEq 1 tablet daily  LABS: 03/23/2018 Creatinine 0.98, BUN 14, Potassium 4.3, Sodium 141, eGFR 60-70 12/08/2017 Creatinine 1.27. BUN 20, Potassium 4.3, Sodium 138, eGFR 44-51 09/17/2017 Creatinine 0.95, BUN 12, Potassium 4.2, Sodium 140, EGFR 63-72 07/23/2017 Creatinine 1.16, BUN 16, Potassium 3.8, Sodium 136, EGFR 48-56  Recommendations: No changes.  Encouraged to call for fluid symptoms.   Follow-up plan: ICM clinic phone appointment on5/02/2019. Office appointment scheduled with Dr Sallyanne Kuster 07/19/2018.  Copy of ICM check sent to Dr.Croitoru.  AT/AF   3 month ICM trend: 06/20/2018    1 Year ICM trend:       [name], RN 06/22/2018 9:27 AM

## 2018-06-23 NOTE — Progress Notes (Signed)
ty

## 2018-06-24 ENCOUNTER — Other Ambulatory Visit: Payer: Self-pay

## 2018-06-24 DIAGNOSIS — E1140 Type 2 diabetes mellitus with diabetic neuropathy, unspecified: Secondary | ICD-10-CM

## 2018-06-24 DIAGNOSIS — E1165 Type 2 diabetes mellitus with hyperglycemia: Principal | ICD-10-CM

## 2018-06-24 DIAGNOSIS — IMO0002 Reserved for concepts with insufficient information to code with codable children: Secondary | ICD-10-CM

## 2018-06-27 MED ORDER — BASAGLAR KWIKPEN 100 UNIT/ML ~~LOC~~ SOPN: 40.0000 [IU] | PEN_INJECTOR | Freq: Every day | SUBCUTANEOUS | 1 refills | Status: DC

## 2018-06-28 ENCOUNTER — Ambulatory Visit: Payer: Medicare HMO | Admitting: Primary Care

## 2018-06-28 ENCOUNTER — Encounter: Payer: Self-pay | Admitting: Primary Care

## 2018-06-28 ENCOUNTER — Other Ambulatory Visit: Payer: Self-pay

## 2018-06-28 VITALS — BP 130/82 | HR 85 | Temp 98.6°F | Ht 66.0 in | Wt 200.0 lb

## 2018-06-28 DIAGNOSIS — Z9989 Dependence on other enabling machines and devices: Secondary | ICD-10-CM

## 2018-06-28 DIAGNOSIS — G4733 Obstructive sleep apnea (adult) (pediatric): Secondary | ICD-10-CM | POA: Diagnosis not present

## 2018-06-28 MED ORDER — BUDESONIDE-FORMOTEROL FUMARATE 160-4.5 MCG/ACT IN AERO: 2.0000 | INHALATION_SPRAY | Freq: Two times a day (BID) | RESPIRATORY_TRACT | 3 refills | Status: DC | PRN

## 2018-06-28 MED ORDER — LEVALBUTEROL TARTRATE 45 MCG/ACT IN AERO: INHALATION_SPRAY | RESPIRATORY_TRACT | 5 refills | Status: DC

## 2018-06-28 NOTE — Patient Instructions (Signed)
Great job wearing your cpap!! No changes today  Continue to wear every night for 4-6 hours or more Do not drive if experiencing excessive daytime fatigue or somnolence  Continue Symbicort twice a day as prescribed Albuterol rescue inhaler every 4-6 hours for shortness of breath/wheezing   Follow up:  6 months with Dr. Vassie Loll or if symptoms acutely worsen   If you find yourself using your rescue inahler more frequenrly you may need to come in for an office visit

## 2018-06-28 NOTE — Progress Notes (Signed)
@Patient  ID: [name], female    DOB: 01/10/1951, 68 y.o.   MRN: [id number]  Chief Complaint  Patient presents with  . Follow-up    breathing well    Referring provider: Garth Bigness, MD  HPI: 68 year old female, never smoked. PMH significant for moderate persistent asthma, obstructive sleep apnea, afib, hypertension, CHF, GERD, type 2 diabetes, CKD, OA right knee. Patient of Dr. Vassie Loll, last seen on 12/28/17. Maintained on Cpap at 8cm.   06/28/2018 Patient presents today for a 6 month follow-up. She has been doing well, no exacerbations since last visit. Continues using Symbicort as prescribed. She has not needed to use her rescue inhaler much but always keeps it on her. Has some trouble with stairs d/t knee [name]. She is compliant with CPAP use, cleans tubing every day and parts once a week. No issues with mask, recently got supplies. Denies fever, chills, sweats, wheezing, cough, leg swelling.     Significant test/events reviewed:  Sleep study  02/2014 : severe apnea, corrected with nasal pillows and 8cmh2o  PFT 06/19/16: FVC 2.74 L (95%) FEV1 2.06 L (92%) FEV1/FVC 0.75  negative bronchodilator response DLCO corrected 58%  11/04/15: FVC 2.95 L (102%) FEV1 2.10 L (93%) FEV1/FVC 0.71  negative bronchodilator response TLC 6.02 L (109%) RV 109% ERV 47% DLCO uncorrected 75% (hgb 12.7)   12/06/08: FVC 3.43 L (99%) FEV1 2.46 L (94%) FEV1/FVC 0.72  negative bronchodilator response TLC 6.17 L (112%) RV 131% ERV 52% DLCO uncorrected 74%  Allergies  Allergen Reactions  . Avelox [Moxifloxacin Hcl In Nacl] Other (See Comments)    Cardiac arrest per pt  . Nsaids Other (See Comments)    Cardiac arrest per pt Cardiac arrest per pt  . Simvastatin Other (See Comments)    myalgias myalgias myalgias  . Moxifloxacin Other (See Comments)  . Ace Inhibitors Other (See Comments) and Cough    Dry cough  Dry cough   Dry cough   . Jardiance [Empagliflozin] Other (See Comments)    Felt "crazy", fatigue, sweating, denies hypoglycemia while taking  . Latex Rash    Immunization History  Administered Date(s) Administered  . H1N1 03/23/2008  . Influenza Split 01/06/2011, 01/18/2012  . Influenza Whole 01/09/2008, 01/21/2009, 01/14/2010  . Influenza,inj,Quad PF,6+ Mos 12/21/2012, 01/04/2014, 01/14/2015, 12/06/2015, 12/04/2016, 12/08/2017  . Pneumococcal Conjugate-13 09/25/2013  . Pneumococcal Polysaccharide-23 01/11/2001, 02/22/2012, 04/08/2017  . Td 01/11/2001  . Tdap 02/22/2012    Past Medical History:  Diagnosis Date  . AICD (automatic cardioverter/defibrillator) present    [employer]onic  . Anemia   . Anxiety   . Arthritis   . Asthma    has had multiple hospitalizations for this  . Atrial fibrillation (HCC)    ablation x 2 [facility], 01/2006, 2011.  on warfarin  . Cardiac arrest [facility]) jan 2012   in hospital for pneumonia when this occured- occured at the hospital  . CHF (congestive heart failure) (HCC) 2012   Echo 08/08/10 by [facility]. EF 35-45%. LV systolic function moderately reduced. Moderate global hypokinesis of LV.  RV systolic function moderately reduced. Mild MR. Trace AR.  Marland Kitchen Complication of anesthesia    difficult time waking up after anesthesia  . Depression   . Diabetes mellitus    Type 2  . Dysrhythmia    Atrial fibrillation  . GASTROESOPHAGEAL REFLUX, NO ESOPHAGITIS 06/10/2006   Qualifier: Diagnosis of  By: Abundio Miu    . GERD (gastroesophageal reflux disease)   . History  of hiatal hernia   . Hyperlipidemia   . Hypertension   . [name] 06/04/2008   LLE, Baker's cyst in popliteal fossa, no DVT  . Non-ischemic cardiomyopathy (HCC)    echo 08/08/10 - EF 35-45% LV and RV systolic function mod reduced  . Primary localized osteoarthritis of left knee   . Primary localized osteoarthritis of right knee   . Primary localized osteoarthritis of right knee   . Sleep apnea     wears CPAP nightly  . Vocal cord disease   . Wears glasses     Tobacco History: Social History   Tobacco Use  Smoking Status Never Smoker  Smokeless Tobacco Never Used   Counseling given: Not Answered   Outpatient Medications Prior to Visit  Medication Sig Dispense Refill  . bisoprolol (ZEBETA) 5 MG tablet TAKE 2 TABLETS BY MOUTH ONCE DAILY 60 tablet 5  . buPROPion (WELLBUTRIN XL) 150 MG 24 hr tablet Take 1 tablet (150 mg total) by mouth daily. 90 tablet 0  . dofetilide (TIKOSYN) 250 MCG capsule Take 1 capsule (250 mcg total) by mouth 2 (two) times daily. 60 capsule 5  . famotidine (PEPCID) 20 MG tablet Take 1 tablet (20 mg total) by mouth daily. 90 tablet 3  . fluticasone (FLONASE) 50 MCG/ACT nasal spray instill 1 spray into each nostril twice a day (Patient taking differently: Place 1 spray into both nostrils 2 (two) times daily as needed for allergies. ) 16 g 5  . glucose blood test strip Use as instructed 100 each 12  . insulin aspart (NOVOLOG FLEXPEN) 100 UNIT/ML FlexPen Inject 5-8 Units into the skin See admin instructions. Inject 5-8 units SQ before supper 15 mL 3  . Insulin Glargine (BASAGLAR KWIKPEN) 100 UNIT/ML SOPN Inject 0.4 mLs (40 Units total) into the skin daily. 15 mL 1  . Insulin Pen Needle (B-D ULTRAFINE III SHORT PEN) 31G X 8 MM MISC Use as directed up to 4 times daily to check blood sugar 100 each 9  . levalbuterol (XOPENEX) 0.63 MG/3ML nebulizer solution One vial in nebulizer four times daily as needed 360 mL 5  . montelukast (SINGULAIR) 10 MG tablet TAKE 1 TABLET BY MOUTH AT BEDTIME 30 tablet 5  . polyethylene glycol powder (GLYCOLAX/MIRALAX) powder take 17GM (DISSOLVED IN WATER) by mouth once daily THREE TIMES A WEEK if needed as directed (Patient taking differently: Mix 17 g in liquid and drink 3 times weekly) 500 g 3  . potassium chloride SA (K-DUR,KLOR-CON) 20 MEQ tablet Take 1 tablet (20 mEq total) by mouth daily as directed. (Patient taking differently:  Take 20 mEq by mouth daily as needed (with Torsemide). ) 30 tablet 11  . PRODIGY LANCETS 28G MISC 1 Units by Does not apply route 4 (four) times daily. 100 each 4  . ranitidine (ZANTAC) 150 MG tablet     . rosuvastatin (CRESTOR) 10 MG tablet TAKE 1 TABLET(10 MG) BY MOUTH DAILY 90 tablet 1  . torsemide (DEMADEX) 20 MG tablet TAKE 1 TABLET BY MOUTH TWICE A DAY AS DIRECTED 180 tablet 1  . VICTOZA 18 MG/3ML SOPN ADMINISTER 1.8 MG UNDER THE SKIN DAILY 9 mL 3  . warfarin (COUMADIN) 5 MG tablet Take 1 to 1 and 1/2 daily as directed by coumadin clinic 135 tablet 1  . budesonide-formoterol (SYMBICORT) 160-4.5 MCG/ACT inhaler Inhale 2 puffs into the lungs 2 (two) times daily as needed. 1 Inhaler 3  . esomeprazole (NEXIUM) 40 MG capsule Take by mouth.    Marland Kitchen  levalbuterol (XOPENEX HFA) 45 MCG/ACT inhaler INHALE 1 TO 2 PUFFS BY MOUTH INTO THE LUNGS EVERY 4 HOURS IF NEEDED FOR WHEEZING 15 Inhaler 5   No facility-administered medications prior to visit.     Review of Systems  Review of Systems  Constitutional: Negative.   HENT: Negative.   Respiratory: Negative for cough, shortness of breath and wheezing.   Cardiovascular: Negative.     Physical Exam  BP 130/82 (BP Location: Left Arm, Cuff Size: Normal)   Pulse 85   Temp 98.6 F (37 C)   Ht 5\' 6"  (1.676 m)   Wt 200 lb (90.7 kg)   LMP 07/26/2011   SpO2 99%   BMI 32.28 kg/m  Physical Exam Constitutional:      Appearance: Normal appearance. She is well-developed.  HENT:     Head: Normocephalic and atraumatic.     Mouth/Throat:     Mouth: Mucous membranes are moist.     Pharynx: Oropharynx is clear.     Comments: Mallampati class II Eyes:     Extraocular Movements: Extraocular movements intact.     Conjunctiva/sclera: Conjunctivae normal.     Pupils: Pupils are equal, round, and reactive to light.  Neck:     Musculoskeletal: Normal range of motion and neck supple.  Cardiovascular:     Rate and Rhythm: Normal rate and regular rhythm.      Heart sounds: Normal heart sounds. No murmur.  Pulmonary:     Effort: Pulmonary effort is normal. No respiratory distress.     Breath sounds: Normal breath sounds. No wheezing.  Skin:    General: Skin is warm and dry.     Findings: No erythema or rash.  Neurological:     Mental Status: She is alert and oriented to person, place, and time.  Psychiatric:        Behavior: Behavior normal.        Judgment: Judgment normal.      Lab Results:  CBC    Component Value Date/Time   WBC 5.0 03/23/2018 1104   WBC 6.4 07/23/2017 1531   RBC 4.06 03/23/2018 1104   RBC 4.42 07/23/2017 1531   HGB 11.9 03/23/2018 1104   HCT 34.5 03/23/2018 1104   PLT 209 03/23/2018 1104   MCV 85 03/23/2018 1104   MCH 29.3 03/23/2018 1104   MCH 28.3 07/23/2017 1531   MCHC 34.5 03/23/2018 1104   MCHC 32.6 07/23/2017 1531   RDW 12.2 (L) 03/23/2018 1104   LYMPHSABS 3.3 07/23/2017 1531   MONOABS 0.5 07/23/2017 1531   EOSABS 0.1 07/23/2017 1531   BASOSABS 0.0 07/23/2017 1531    BMET    Component Value Date/Time   NA 141 05/23/2018 1206   K 4.5 05/23/2018 1206   CL 103 05/23/2018 1206   CO2 23 05/23/2018 1206   GLUCOSE 152 (H) 05/23/2018 1206   GLUCOSE 137 (H) 07/23/2017 1531   BUN 17 05/23/2018 1206   CREATININE 1.17 (H) 05/23/2018 1206   CREATININE 1.23 (H) 11/23/2016 0812   CALCIUM 8.7 05/23/2018 1206   GFRNONAA 48 (L) 05/23/2018 1206   GFRNONAA 54 (L) 09/02/2015 0918   GFRAA 56 (L) 05/23/2018 1206   GFRAA 62 09/02/2015 0918    BNP    Component Value Date/Time   BNP 79.5 12/31/2015 1636    ProBNP    Component Value Date/Time   PROBNP 837.0 (H) 05/12/2010 0545    Imaging: No results found.   Assessment & Plan:   OSA on CPAP -  Patient is 100% compliance with CPAP and reports benefit from use - Download showed 90/90 day use, pressure 8cm H20 and AHI 1.8 - No fit issues, using cleaning device regularly - FU in 6 months   Asthma, moderate persistent - Stable, no recent  exacerbations - Continues Symbicort 160 2bid, prn xopenex hfa - FU in 6 months or if symptoms acutely worsen. Monitor for increased wheezing or sob.    Glenford Bayley, NP 06/28/2018

## 2018-06-28 NOTE — Assessment & Plan Note (Signed)
-   Stable, no recent exacerbations - Continues Symbicort 160 2bid, prn xopenex hfa - FU in 6 months or if symptoms acutely worsen. Monitor for increased wheezing or sob.

## 2018-06-28 NOTE — Assessment & Plan Note (Signed)
-   Patient is 100% compliance with CPAP and reports benefit from use - Download showed 90/90 day use, pressure 8cm H20 and AHI 1.8 - No fit issues, using cleaning device regularly - FU in 6 months

## 2018-06-29 ENCOUNTER — Ambulatory Visit: Payer: Medicare HMO

## 2018-07-01 ENCOUNTER — Other Ambulatory Visit: Payer: Self-pay | Admitting: Adult Health

## 2018-07-04 DIAGNOSIS — M1711 Unilateral primary osteoarthritis, right knee: Secondary | ICD-10-CM | POA: Diagnosis not present

## 2018-07-05 ENCOUNTER — Other Ambulatory Visit: Payer: Self-pay | Admitting: Cardiovascular Disease

## 2018-07-05 NOTE — Telephone Encounter (Signed)
New message    *STAT* If patient is at the pharmacy, call can be transferred to refill team.   1. Which medications need to be refilled? (please list name of each medication and dose if known) dofetilide (TIKOSYN) 250 MCG capsule  2. Which pharmacy/location (including street and city if local pharmacy) is medication to be sent to?[facility] [phone] - [place], [place] - [address]  3. Do they need a 30 day or 90 day supply? 90

## 2018-07-06 MED ORDER — DOFETILIDE 250 MCG PO CAPS: 250.0000 ug | ORAL_CAPSULE | Freq: Two times a day (BID) | ORAL | 2 refills | Status: DC

## 2018-07-14 ENCOUNTER — Ambulatory Visit: Payer: Medicare HMO | Admitting: Family Medicine

## 2018-07-15 ENCOUNTER — Telehealth: Payer: Self-pay

## 2018-07-15 NOTE — Telephone Encounter (Signed)
Virtual Visit Pre-Appointment Phone Call  [name] has been deemed a candidate for a follow-up tele-health visit to limit community exposure during the Covid-19 pandemic. I spoke with the patient via phone to ensure availability of phone/video source, confirm preferred email & phone number, and discuss instructions and expectations.  I reminded [name] to be prepared with any vital sign and/or heart rhythm information that could potentially be obtained via home monitoring, at the time of her visit. I reminded [name] to expect a phone call at the time of her visit if her visit.  Did the patient verbally acknowledge consent to treatment? Patient provided verbal consent.  Shatira Dobosz, [place], CMA 07/15/2018 11:20 AM   DOWNLOADING THE WEBEX SOFTWARE TO SMARTPHONE  - If Apple, go to [employer] and type in WebEx in the search bar. Download [employer], the blue/green circle. The app is free but as with any other app downloads, their phone may require them to verify saved payment information or Apple password. The patient does NOT have to create an account.  - If Android, ask patient to go to [employer] and type in WebEx in the search bar. Download [employer], the blue/green circle. The app is free but as with any other app downloads, their phone may require them to verify saved payment information or Android password. The patient does NOT have to create an account.   CONSENT FOR TELE-HEALTH VISIT - PLEASE REVIEW  I hereby voluntarily request, consent and authorize [facility] and its employed or contracted physicians, physician assistants, nurse practitioners or other licensed health care professionals (the Practitioner), to provide me with telemedicine health care services (the "Services") as deemed necessary by the treating Practitioner. I acknowledge and consent to receive the Services by the Practitioner via telemedicine. I understand that the  telemedicine visit will involve communicating with the Practitioner through live audiovisual communication technology and the disclosure of certain medical information by electronic transmission. I acknowledge that I have been given the opportunity to request an in-person assessment or other available alternative prior to the telemedicine visit and am voluntarily participating in the telemedicine visit.  I understand that I have the right to withhold or withdraw my consent to the use of telemedicine in the course of my care at any time, without affecting my right to future care or treatment, and that the Practitioner or I may terminate the telemedicine visit at any time. I understand that I have the right to inspect all information obtained and/or recorded in the course of the telemedicine visit and may receive copies of available information for a reasonable fee.  I understand that some of the potential risks of receiving the Services via telemedicine include:  Marland Kitchen Delay or interruption in medical evaluation due to technological equipment failure or disruption; . Information transmitted may not be sufficient (e.g. poor resolution of images) to allow for appropriate medical decision making by the Practitioner; and/or  . In rare instances, security protocols could fail, causing a breach of personal health information.  Furthermore, I acknowledge that it is my responsibility to provide information about my medical history, conditions and care that is complete and accurate to the best of my ability. I acknowledge that Practitioner's advice, recommendations, and/or decision may be based on factors not within their control, such as incomplete or inaccurate data provided by me or distortions of diagnostic images or specimens that may result from electronic transmissions. I understand that the practice  of medicine is not an Chief Strategy Officer and that Practitioner makes no warranties or guarantees regarding treatment  outcomes. I acknowledge that I will receive a copy of this consent concurrently upon execution via email to the email address I last provided but may also request a printed copy by calling the office of [place].    I understand that my insurance will be billed for this visit.   I have read or had this consent read to me. . I understand the contents of this consent, which adequately explains the benefits and risks of the Services being provided via telemedicine.  . I have been provided ample opportunity to ask questions regarding this consent and the Services and have had my questions answered to my satisfaction. . I give my informed consent for the services to be provided through the use of telemedicine in my medical care  By participating in this telemedicine visit I agree to the above.

## 2018-07-18 ENCOUNTER — Telehealth: Payer: Self-pay

## 2018-07-18 ENCOUNTER — Telehealth (INDEPENDENT_AMBULATORY_CARE_PROVIDER_SITE_OTHER): Payer: Medicare HMO | Admitting: Cardiovascular Disease

## 2018-07-18 ENCOUNTER — Encounter: Payer: Self-pay | Admitting: Cardiovascular Disease

## 2018-07-18 VITALS — BP 104/64 | HR 70 | Ht 66.0 in | Wt 196.4 lb

## 2018-07-18 DIAGNOSIS — IMO0002 Reserved for concepts with insufficient information to code with codable children: Secondary | ICD-10-CM

## 2018-07-18 DIAGNOSIS — E0842 Diabetes mellitus due to underlying condition with diabetic polyneuropathy: Secondary | ICD-10-CM

## 2018-07-18 DIAGNOSIS — E1122 Type 2 diabetes mellitus with diabetic chronic kidney disease: Secondary | ICD-10-CM

## 2018-07-18 DIAGNOSIS — E669 Obesity, unspecified: Secondary | ICD-10-CM | POA: Diagnosis not present

## 2018-07-18 DIAGNOSIS — I509 Heart failure, unspecified: Secondary | ICD-10-CM

## 2018-07-18 DIAGNOSIS — G4733 Obstructive sleep apnea (adult) (pediatric): Secondary | ICD-10-CM

## 2018-07-18 DIAGNOSIS — I130 Hypertensive heart and chronic kidney disease with heart failure and stage 1 through stage 4 chronic kidney disease, or unspecified chronic kidney disease: Secondary | ICD-10-CM

## 2018-07-18 DIAGNOSIS — Z95810 Presence of automatic (implantable) cardiac defibrillator: Secondary | ICD-10-CM

## 2018-07-18 DIAGNOSIS — J45909 Unspecified asthma, uncomplicated: Secondary | ICD-10-CM

## 2018-07-18 DIAGNOSIS — E785 Hyperlipidemia, unspecified: Secondary | ICD-10-CM | POA: Diagnosis not present

## 2018-07-18 DIAGNOSIS — N189 Chronic kidney disease, unspecified: Secondary | ICD-10-CM | POA: Diagnosis not present

## 2018-07-18 DIAGNOSIS — I480 Paroxysmal atrial fibrillation: Secondary | ICD-10-CM | POA: Diagnosis not present

## 2018-07-18 DIAGNOSIS — I472 Ventricular tachycardia, unspecified: Secondary | ICD-10-CM

## 2018-07-18 DIAGNOSIS — E119 Type 2 diabetes mellitus without complications: Secondary | ICD-10-CM | POA: Diagnosis not present

## 2018-07-18 DIAGNOSIS — N182 Chronic kidney disease, stage 2 (mild): Secondary | ICD-10-CM

## 2018-07-18 DIAGNOSIS — E7800 Pure hypercholesterolemia, unspecified: Secondary | ICD-10-CM

## 2018-07-18 DIAGNOSIS — I10 Essential (primary) hypertension: Secondary | ICD-10-CM

## 2018-07-18 DIAGNOSIS — Z6831 Body mass index (BMI) 31.0-31.9, adult: Secondary | ICD-10-CM

## 2018-07-18 DIAGNOSIS — E1165 Type 2 diabetes mellitus with hyperglycemia: Principal | ICD-10-CM

## 2018-07-18 DIAGNOSIS — I5032 Chronic diastolic (congestive) heart failure: Secondary | ICD-10-CM

## 2018-07-18 DIAGNOSIS — Z9989 Dependence on other enabling machines and devices: Secondary | ICD-10-CM

## 2018-07-18 DIAGNOSIS — M1711 Unilateral primary osteoarthritis, right knee: Secondary | ICD-10-CM

## 2018-07-18 DIAGNOSIS — Z7901 Long term (current) use of anticoagulants: Secondary | ICD-10-CM

## 2018-07-18 DIAGNOSIS — Z794 Long term (current) use of insulin: Secondary | ICD-10-CM

## 2018-07-18 DIAGNOSIS — I4891 Unspecified atrial fibrillation: Secondary | ICD-10-CM

## 2018-07-18 DIAGNOSIS — Z79899 Other long term (current) drug therapy: Secondary | ICD-10-CM

## 2018-07-18 DIAGNOSIS — E1140 Type 2 diabetes mellitus with diabetic neuropathy, unspecified: Secondary | ICD-10-CM

## 2018-07-18 NOTE — Patient Instructions (Addendum)
Medication Instructions:  Your physician recommends that you continue on your current medications as directed. Please refer to the Current Medication list given to you today.  If you need a refill on your cardiac medications before your next appointment, please call your pharmacy.   Lab work: Your physician recommends that you return for lab work: BMET and Hemoglobin A1C  You can go to our office to have blood work done. Please have done this week. Our office lab is open from 8:00 AM-4:30 PM. You do not need an appointment for this, it is walk-in.   If you have labs (blood work) drawn today and your tests are completely normal, you will receive your results only by: Marland Kitchen MyChart Message (if you have MyChart) OR . A paper copy in the mail If you have any lab test that is abnormal or we need to change your treatment, we will call you to review the results.  Follow-Up: At [facility], you and your health needs are our priority.  As part of our continuing mission to provide you with exceptional heart care, we have created designated Provider Care Teams.  These Care Teams include your primary Cardiologist (physician) and Advanced Practice Providers (APPs -  Physician Assistants and Nurse Practitioners) who all work together to provide you with the care you need, when you need it. You will need a follow up appointment in 6 months.  Please call our office 2 months in advance to schedule this appointment.  You may see Thurmon Fair, MD or one of the following Advanced Practice Providers on your designated Care Team: [place], [place] . Micah Flesher, PA-C  Any Other Special Instructions Will Be Listed Below (If Applicable). Please keep your appointment with [facility] tomorrow scheduled at our [address] office drive-thru.

## 2018-07-18 NOTE — Telephone Encounter (Signed)

## 2018-07-18 NOTE — Progress Notes (Signed)
Virtual Visit via Telephone Note   This visit type was conducted due to national recommendations for restrictions regarding the COVID-19 Pandemic (e.g. social distancing) in an effort to limit this patient's exposure and mitigate transmission in our community.  Due to her co-morbid illnesses, this patient is at least at moderate risk for complications without adequate follow up.  This format is felt to be most appropriate for this patient at this time.  The patient did not have access to video technology/had technical difficulties with video requiring transitioning to audio format only (telephone).  All issues noted in this document were discussed and addressed.  No physical exam could be performed with this format.  Please refer to the patient's chart for her  consent to telehealth for [facility].   Evaluation Performed:  Follow-up visit  Date:  07/18/2018   ID:  [name], DOB 04-06-1951, MRN [id number]  Patient Location: Home  Provider Location: Home  PCP:  Diane Bigness, MD  Cardiologist:  [name] Electrophysiologist:  None   Chief Complaint:  CHF, AFIb, VT follow up  History of Present Illness:    [name] is a 68 y.o. female who presents via audio/video conferencing for a telehealth visit today.    She has been feeling well.  She denies any cardiac problems. The patient specifically denies any chest pain at rest exertion, dyspnea at rest or with exertion, orthopnea, paroxysmal nocturnal dyspnea, syncope, palpitations, focal neurological deficits, intermittent claudication, lower extremity edema, unexplained weight gain, cough, hemoptysis or wheezing.    She reports 100% compliance with CPAP for sleep apnea.  She has been working hard on improving her health and has lost about 20 pounds in last 10 months.  She believes her glucose control has improved and is due to have a repeat hemoglobin A1c.  Normal electrolytes and stable renal function by labs in February 2020.   However, she had a lot of pain in the right knee.  She went to see [name] and he drew blood out of the joint, with improvement in her symptoms.  She has been trying to schedule knee replacement for some time, delayed because of poor diabetes control.  Review of her defibrillator download from March 9 shows normal device function.  Estimated battery longevity is 8.8 years.  Lead parameters are all excellent.  Activity level (up to the instructions to shelter at home), was roughly 3 is a day.  OptiVol is stable.  She has roughly 94% atrial pacing virtually no ventricular pacing but overall burden paroxysmal atrial fibrillation is unchanged at about 1.8%, most episodes lasting only for a few hours.  She had one episode of nonsustained VT that was a little long (222 bpm, roughly 25 beats, 6 seconds).  This occurred at 5:00 in the morning and was asymptomatic.  Prior to device changeout in 2019 she did have antitachycardia pace for ventricular tachycardia in December 2016.  No sustained events since, although she does have occasional nonsustained VT.  The patient does not have symptoms concerning for COVID-19 infection (fever, chills, cough, or new shortness of breath).    Past Medical History:  Diagnosis Date  . AICD (automatic cardioverter/defibrillator) present    Medtronic  . Anemia   . Anxiety   . Arthritis   . Asthma    has had multiple hospitalizations for this  . Atrial fibrillation (HCC)    ablation x 2 [facility], 01/2006, 2011.  on warfarin  . Cardiac arrest [facility]) jan 2012  in hospital for pneumonia when this occured- occured at the hospital  . CHF (congestive heart failure) (HCC) 2012   Echo 08/08/10 by [facility]. EF 35-45%. LV systolic function moderately reduced. Moderate global hypokinesis of LV.  RV systolic function moderately reduced. Mild MR. Trace AR.  Marland Kitchen Complication of anesthesia    difficult time waking up after anesthesia  . Depression   . Diabetes mellitus     Type 2  . Dysrhythmia    Atrial fibrillation  . GASTROESOPHAGEAL REFLUX, NO ESOPHAGITIS 06/10/2006   Qualifier: Diagnosis of  By: Abundio Miu    . GERD (gastroesophageal reflux disease)   . History of hiatal hernia   . Hyperlipidemia   . Hypertension   . Limb pain 06/04/2008   LLE, Baker's cyst in popliteal fossa, no DVT  . Non-ischemic cardiomyopathy (HCC)    echo 08/08/10 - EF 35-45% LV and RV systolic function mod reduced  . Primary localized osteoarthritis of left knee   . Primary localized osteoarthritis of right knee   . Primary localized osteoarthritis of right knee   . Sleep apnea    wears CPAP nightly  . Vocal cord disease   . Wears glasses    Past Surgical History:  Procedure Laterality Date  . BREAST EXCISIONAL BIOPSY Right 1999  . BREAST LUMPECTOMY Right   . CARDIAC DEFIBRILLATOR PLACEMENT  05/02/10   Medtronic Protecta XT-DR for CHF-VT, last download 04/12/12  . CHOLECYSTECTOMY    . COLONOSCOPY    . HAMMER TOE SURGERY Right   . ICD GENERATOR CHANGEOUT N/A 09/28/2017   Procedure: ICD GENERATOR CHANGEOUT;  Surgeon: Diane Fair, MD;  Location: [facility];  Service: Cardiovascular;  Laterality: N/A;  . KNEE ARTHROSCOPY Right   . PAF Ablation     By Dr [name]. Now sees Dr [name] [facility]  . TOTAL KNEE ARTHROPLASTY Left 09/17/2014  . TOTAL KNEE ARTHROPLASTY Left 09/17/2014   Procedure: TOTAL KNEE ARTHROPLASTY;  Surgeon: Diane Marvel, MD;  Location: [facility] OR;  Service: Orthopedics;  Laterality: Left;  pt has ICD  . TUBAL LIGATION       Current Meds  Medication Sig  . bisoprolol (ZEBETA) 5 MG tablet TAKE 2 TABLETS BY MOUTH ONCE DAILY  . budesonide-formoterol (SYMBICORT) 160-4.5 MCG/ACT inhaler Inhale 2 puffs into the lungs 2 (two) times daily as needed.  Marland Kitchen buPROPion (WELLBUTRIN XL) 150 MG 24 hr tablet Take 1 tablet (150 mg total) by mouth daily.  Marland Kitchen dofetilide (TIKOSYN) 250 MCG capsule Take 1 capsule (250 mcg total) by mouth 2 (two) times daily.  .  famotidine (PEPCID) 20 MG tablet Take 20 mg by mouth daily.  . fluticasone (FLONASE) 50 MCG/ACT nasal spray instill 1 spray into each nostril twice a day (Patient taking differently: Place 1 spray into both nostrils 2 (two) times daily as needed for allergies. )  . glucose blood test strip Use as instructed  . insulin aspart (NOVOLOG FLEXPEN) 100 UNIT/ML FlexPen Inject 5-8 Units into the skin See admin instructions. Inject 5-8 units SQ before supper  . Insulin Glargine (BASAGLAR KWIKPEN) 100 UNIT/ML SOPN Inject 0.4 mLs (40 Units total) into the skin daily.  . Insulin Pen Needle (B-D ULTRAFINE III SHORT PEN) 31G X 8 MM MISC Use as directed up to 4 times daily to check blood sugar  . levalbuterol (XOPENEX HFA) 45 MCG/ACT inhaler INHALE 1 TO 2 PUFFS BY MOUTH INTO THE LUNGS EVERY 4 HOURS IF NEEDED FOR WHEEZING  . levalbuterol (XOPENEX)  0.63 MG/3ML nebulizer solution One vial in nebulizer four times daily as needed  . montelukast (SINGULAIR) 10 MG tablet TAKE 1 TABLET BY MOUTH AT BEDTIME  . polyethylene glycol powder (GLYCOLAX/MIRALAX) powder take 17GM (DISSOLVED IN WATER) by mouth once daily THREE TIMES A WEEK if needed as directed (Patient taking differently: Mix 17 g in liquid and drink 3 times weekly)  . potassium chloride SA (K-DUR,KLOR-CON) 20 MEQ tablet Take 1 tablet (20 mEq total) by mouth daily as directed. (Patient taking differently: Take 20 mEq by mouth daily as needed (with Torsemide). )  . PRODIGY LANCETS 28G MISC 1 Units by Does not apply route 4 (four) times daily.  . rosuvastatin (CRESTOR) 10 MG tablet TAKE 1 TABLET(10 MG) BY MOUTH DAILY  . torsemide (DEMADEX) 20 MG tablet TAKE 1 TABLET BY MOUTH TWICE A DAY AS DIRECTED (Patient taking differently: Take 20 mg by mouth daily. )  . VICTOZA 18 MG/3ML SOPN ADMINISTER 1.8 MG UNDER THE SKIN DAILY  . warfarin (COUMADIN) 5 MG tablet Take 1 to 1 and 1/2 daily as directed by coumadin clinic     Allergies:   Avelox [moxifloxacin hcl in nacl];  Nsaids; Simvastatin; Moxifloxacin; Ace inhibitors; Jardiance [empagliflozin]; and Latex   Social History   Tobacco Use  . Smoking status: Never Smoker  . Smokeless tobacco: Never Used  Substance Use Topics  . Alcohol use: No  . Drug use: No     Family Hx: The patient's family history includes Diabetes in her brother and father; Hypertension in her brother and father. There is no history of Lung disease, Cancer, or Rheumatologic disease.  ROS:   Please see the history of present illness.     All other systems reviewed and are negative.   Prior CV studies:   The following studies were reviewed today:  Comprehensive defibrillator download  Labs/Other Tests and Data Reviewed:    EKG: Reviewed ECG from September 28, 2017 Shows atrial paced, ventricular sensed rhythm with prolonged QTC 489 ms, on dofetilide).  Recent Labs: 03/23/2018: Hemoglobin 11.9; Platelets 209 05/23/2018: BUN 17; Creatinine, Ser 1.17; Magnesium 2.0; Potassium 4.5; Sodium 141   Recent Lipid Panel Lab Results  Component Value Date/Time   CHOL 106 04/21/2016 02:38 PM   TRIG 73 04/21/2016 02:38 PM   HDL 44 (L) 04/21/2016 02:38 PM   CHOLHDL 2.4 04/21/2016 02:38 PM   LDLCALC 47 04/21/2016 02:38 PM   LDLDIRECT 94 12/07/2007 08:19 PM    Wt Readings from Last 3 Encounters:  07/18/18 196 lb 6.4 oz (89.1 kg)  06/28/18 200 lb (90.7 kg)  03/23/18 202 lb (91.6 kg)     Objective:    Vital Signs:  BP 104/64   Pulse 70   Ht 5\' 6"  (1.676 m)   Wt 196 lb 6.4 oz (89.1 kg)   LMP 07/26/2011   BMI 31.70 kg/m    Unable to perform physical exam  ASSESSMENT & PLAN:    1. VT: Lengthy, asymptomatic nonsustained event 2 months ago. Labs were normal. Continue to monitor. History of appropriate ATP for VT in the past. 2. PAF: Asymptomatic, low overall burden on dofetilide. (1.8%) .  As before, ventricular rate control is not perfect, but is acceptable, especially since the episodes of arrhythmia or so brief. 3. CHF: Did  not have the benefit of a physical exam, but she has excellent functional status and her OptiVol is stable.  She continues to lose true weight and we will have to reassess her "dry weight" ,  probably now well under 200 pounds. 4. ICD:  Normal device function.  Remote downloads every 3 months. 5. Warfarin anticoagulation:  Recent hemarthrosis which is also related to degenerative joint disease.  Usually has a very well controlled INR 2.0-2.3 range.    To be rechecked tomorrow.  Has not had any other bleeding complications or history of embolic stroke/TIA. CHADVasc 4 (CHF, gender, HTN, DM).   6. HTN:  Excellent control 7. HLP: On statin, all lipid parameters in target range. 8. Obesity:  Congratulated on her weight loss.  She needs to lose best a few pounds and will be only overweight. 9. OSA: 100% compliant with CPAP and has benefit from this therapy by her report 10. Tikosyn: She had a routine EEG acute measures, but due to the current antibiotic we would like to avoid any close personal contact with healthcare workers.  Thankfully defibrillator does offer some protection from torsades.  Recent labs okay. 11. Asthma:  Highly selective beta blockers (bisoprolol) preferred. 12. DM on long-term insulin therapy.    Is due for hemoglobin A1c.  We will try to coordinate with her prothrombin time check to limit trips outside the house. 13. CKD: Recheck labs at least every 6 months while on dofetilide.  Most recent creatinine clearance was improved, around 70 mL/min  COVID-19 Education: The signs and symptoms of COVID-19 were discussed with the patient and how to seek care for testing (follow up with PCP or arrange E-visit).  The importance of social distancing was discussed today.  Time:   Today, I have spent 22 minutes with the patient with telehealth technology discussing the above problems.     Medication Adjustments/Labs and Tests Ordered: Current medicines are reviewed at length with the patient  today.  Concerns regarding medicines are outlined above.  Tests Ordered: No orders of the defined types were placed in this encounter.  Medication Changes: No orders of the defined types were placed in this encounter.   Disposition:  Follow up remote ICD check in 3 months, office visit in 6 months  Signed, Diane Fair, MD  07/18/2018 9:24 AM    [facility]

## 2018-07-19 ENCOUNTER — Ambulatory Visit (INDEPENDENT_AMBULATORY_CARE_PROVIDER_SITE_OTHER): Payer: Medicare HMO | Admitting: Pharmacist

## 2018-07-19 ENCOUNTER — Other Ambulatory Visit: Payer: Self-pay

## 2018-07-19 ENCOUNTER — Encounter: Payer: Medicare HMO | Admitting: Cardiovascular Disease

## 2018-07-19 DIAGNOSIS — I480 Paroxysmal atrial fibrillation: Secondary | ICD-10-CM | POA: Diagnosis not present

## 2018-07-19 DIAGNOSIS — Z7901 Long term (current) use of anticoagulants: Secondary | ICD-10-CM | POA: Diagnosis not present

## 2018-07-19 DIAGNOSIS — I4891 Unspecified atrial fibrillation: Secondary | ICD-10-CM

## 2018-07-19 LAB — POCT INR: INR: 2.7 (ref 2.0–3.0)

## 2018-07-26 DIAGNOSIS — M1711 Unilateral primary osteoarthritis, right knee: Secondary | ICD-10-CM | POA: Diagnosis not present

## 2018-07-28 ENCOUNTER — Telehealth: Payer: Self-pay | Admitting: *Deleted

## 2018-07-28 ENCOUNTER — Telehealth (INDEPENDENT_AMBULATORY_CARE_PROVIDER_SITE_OTHER): Payer: Medicare HMO | Admitting: Family Medicine

## 2018-07-28 ENCOUNTER — Other Ambulatory Visit: Payer: Self-pay

## 2018-07-28 DIAGNOSIS — E1165 Type 2 diabetes mellitus with hyperglycemia: Secondary | ICD-10-CM

## 2018-07-28 DIAGNOSIS — E1140 Type 2 diabetes mellitus with diabetic neuropathy, unspecified: Secondary | ICD-10-CM

## 2018-07-28 DIAGNOSIS — IMO0002 Reserved for concepts with insufficient information to code with codable children: Secondary | ICD-10-CM

## 2018-07-28 NOTE — Telephone Encounter (Signed)
   [facility] Pre-operative Risk Assessment    Request for surgical clearance:  1. What type of surgery is being performed? RIGHT TOTAL KNEE REPLACEMENT   2. When is this surgery scheduled? July OR AUGUST  3. What type of clearance is required (medical clearance vs. Pharmacy clearance to hold med vs. Both)? BOTH  4. Are there any medications that need to be held prior to surgery and how long? COUMADIN  5. Practice name and name of physician performing surgery?  [facility]  6. What is your office phone number [phone]   7.   What is your office fax number [phone]  8.   Anesthesia type (None, local, MAC, general) ? CHOICE   Devra Dopp 07/28/2018, 12:01 PM  _________________________________________________________________   (provider comments below)

## 2018-07-28 NOTE — Telephone Encounter (Signed)
With surgery not planned until July or August, Pt has time to be seen in the office. She is also due for routine follow up anyway. We would not want to provide clearance more than 2 months prior to the surgery.   Please arrange for appt in early July.      Primary Cardiologist:Mihai Croitoru, MD  Chart reviewed as part of pre-operative protocol coverage. Because of [name] past medical history and time since last visit, he/she will require a follow-up visit in order to better assess preoperative cardiovascular risk.  Pre-op covering staff: - Please schedule appointment and call patient to inform them. - Please contact requesting surgeon's office via preferred method (i.e, phone, fax) to inform them of need for appointment prior to surgery.  If applicable, this message will also be routed to pharmacy pool and/or primary cardiologist for input on holding anticoagulant/antiplatelet agent as requested below so that this information is available at time of patient's appointment.   Berton Bon, NP  07/28/2018, 12:13 PM

## 2018-07-28 NOTE — Assessment & Plan Note (Signed)
Controlled per patient home glucose log.  Continue current medication regimen with basal glargine 40 units daily, NovoLog 5 to 8 units with meals, and Victoza 1.8 mg daily.  Patient was counseled at length regarding diabetic dietary modifications and was given handout via my chart to help her with meal planning.  She states that her goal is to eat 3 real meals a day to avoid snacking.  Discussed follow-up with PCP for recheck A1c when need for social distancing is over.

## 2018-07-28 NOTE — Progress Notes (Signed)
[place] [facility] Telemedicine Visit  Patient consented to have virtual visit. Method of visit: Telephone  Encounter participants: Patient: [name]  Provider: Leland Her Others (if applicable): none  Chief Complaint: diabetes   HPI: On basaglar 40U every morning, 5-8U novolog.  Victoza 1.8 mg daily. Checks sugar twice a day. Avg in 130-140s. Has not had any symptomatic lows. Her lowest readings have around 109. She had a few readings in the low 200s which has only happened in the setting of forgetting her novolog.  She states she does overall very well with her medication compliance. She states her biggest issues are around healthy eating.  She has worked on portion control however she knows that she needs to make some improvements and healthy eating.  She usually eats oatmeal for breakfast, sandwich for lunch and a light dinner.  This leaves her hungry sometimes and she will end up having some [employer] crackers. She has not been exercising as her knee has been quite painful.  She voices high motivation to get her A1c down below 7.5 so she can have her knee replacement over the summer.   ROS: per HPI  Pertinent PMHx: Diabetes, hypertension, GERD, hyperlipidemia, obesity   Assessment/Plan:  Type 2 diabetes, uncontrolled, with neuropathy (HCC) Controlled per patient home glucose log.  Continue current medication regimen with basal glargine 40 units daily, NovoLog 5 to 8 units with meals, and Victoza 1.8 mg daily.  Patient was counseled at length regarding diabetic dietary modifications and was given handout via my chart to help her with meal planning.  She states that her goal is to eat 3 real meals a day to avoid snacking.  Discussed follow-up with PCP for recheck A1c when need for social distancing is over.    Time spent during visit with patient: 18 minutes  Billing: yes  Leland Her, DO PGY-3, [facility] 07/28/2018 2:10 PM

## 2018-07-29 NOTE — Telephone Encounter (Signed)
We would prefer to give pharmacy clearance within 2 months of the procedure.

## 2018-08-05 NOTE — Telephone Encounter (Signed)
No ECG since June 2019, so probably in person visit unless the virus situation worsens [facility]

## 2018-08-05 NOTE — Telephone Encounter (Signed)
Request to get appointment scheduled for July is noted.  I was able to make the patient an appointment for July 7 and contacted her.  Generally doing well, is agreeable to coming in on July 7 at 9 AM to see Dr. Royann Shivers.  If a virtual visit is needed, that is okay.  She has an Risk analyst.  No other questions or concerns.  Theodore Demark, PA-C 08/05/2018 9:24 AM Beeper [phone]

## 2018-08-12 ENCOUNTER — Telehealth: Payer: Self-pay | Admitting: Family Medicine

## 2018-08-12 NOTE — Telephone Encounter (Signed)
Received surgical form from [facility]. Patient has known DM, last a1c was 8.0 on 03/23/2018. Her CBGs since have been more well controlled. I am happy to bring her in to get a repeat a1c if they desire. I will reply to Delbert Harness that she is medically optimized, but she will need cardiac clearance from her cardiologist, who is Dr. Royann Shivers. Placed form in fax box.

## 2018-08-22 ENCOUNTER — Telehealth: Payer: Self-pay

## 2018-08-22 ENCOUNTER — Other Ambulatory Visit: Payer: Self-pay

## 2018-08-22 ENCOUNTER — Ambulatory Visit (INDEPENDENT_AMBULATORY_CARE_PROVIDER_SITE_OTHER): Payer: Medicare HMO

## 2018-08-22 DIAGNOSIS — J45909 Unspecified asthma, uncomplicated: Secondary | ICD-10-CM | POA: Diagnosis not present

## 2018-08-22 DIAGNOSIS — I5032 Chronic diastolic (congestive) heart failure: Secondary | ICD-10-CM

## 2018-08-22 DIAGNOSIS — Z95810 Presence of automatic (implantable) cardiac defibrillator: Secondary | ICD-10-CM | POA: Diagnosis not present

## 2018-08-22 DIAGNOSIS — J449 Chronic obstructive pulmonary disease, unspecified: Secondary | ICD-10-CM | POA: Diagnosis not present

## 2018-08-22 NOTE — Telephone Encounter (Signed)
Left message for patient to remind of missed remote transmission.  

## 2018-08-22 NOTE — Telephone Encounter (Signed)
Returned call as requested by voice mail message.  She reported the monitor is not working ans he spoke to [employer] support and they told her the battery needed charging.  She said the battery is charged but still not working.  Advised to call tech service number again and ask if a new monitor is needed.  She said she will call when she has an update.

## 2018-08-23 ENCOUNTER — Ambulatory Visit (INDEPENDENT_AMBULATORY_CARE_PROVIDER_SITE_OTHER): Payer: Medicare HMO | Admitting: *Deleted

## 2018-08-23 ENCOUNTER — Encounter: Payer: Self-pay | Admitting: Nurse Practitioner

## 2018-08-23 ENCOUNTER — Other Ambulatory Visit: Payer: Self-pay

## 2018-08-23 DIAGNOSIS — I480 Paroxysmal atrial fibrillation: Secondary | ICD-10-CM

## 2018-08-23 DIAGNOSIS — I5032 Chronic diastolic (congestive) heart failure: Secondary | ICD-10-CM

## 2018-08-23 LAB — CUP PACEART REMOTE DEVICE CHECK
Battery Remaining Longevity: 104 mo
Battery Voltage: 2.99 V
Brady Statistic AP VP Percent: 0.68 %
Brady Statistic AP VS Percent: 94.88 %
Brady Statistic AS VP Percent: 0.03 %
Brady Statistic AS VS Percent: 4.41 %
Brady Statistic RA Percent Paced: 92.35 %
Brady Statistic RV Percent Paced: 0.78 %
Date Time Interrogation Session: 20200512073327
HighPow Impedance: 75 Ohm
Implantable Lead Implant Date: 20120120
Implantable Lead Implant Date: 20120120
Implantable Lead Location: 753859
Implantable Lead Location: 753860
Implantable Lead Model: 5076
Implantable Lead Model: 6935
Implantable Pulse Generator Implant Date: 20190618
Lead Channel Impedance Value: 380 Ohm
Lead Channel Impedance Value: 399 Ohm
Lead Channel Impedance Value: 437 Ohm
Lead Channel Pacing Threshold Amplitude: 0.5 V
Lead Channel Pacing Threshold Amplitude: 0.75 V
Lead Channel Pacing Threshold Pulse Width: 0.4 ms
Lead Channel Pacing Threshold Pulse Width: 0.4 ms
Lead Channel Sensing Intrinsic Amplitude: 1.875 mV
Lead Channel Sensing Intrinsic Amplitude: 1.875 mV
Lead Channel Sensing Intrinsic Amplitude: 12.625 mV
Lead Channel Sensing Intrinsic Amplitude: 12.625 mV
Lead Channel Setting Pacing Amplitude: 2 V
Lead Channel Setting Pacing Amplitude: 2.5 V
Lead Channel Setting Pacing Pulse Width: 0.4 ms
Lead Channel Setting Sensing Sensitivity: 0.3 mV

## 2018-08-23 MED ORDER — BUPROPION HCL ER (XL) 150 MG PO TB24: 150.0000 mg | ORAL_TABLET | Freq: Every day | ORAL | 0 refills | Status: DC

## 2018-08-23 NOTE — Telephone Encounter (Signed)
This encounter was created in error - please disregard.

## 2018-08-24 NOTE — Progress Notes (Signed)
EPIC Encounter for ICM Monitoring  Patient Name: [name] is a 68 y.o. female Date: 08/24/2018 Primary Care Physican: Sela Hilding, MD Primary Cardiologist:Croitoru Electrophysiologist: Croitoru Last Weight:198lbs 08/24/2018 Weight:196 - 200 lbs  Clinical Status (20-Jun-2018 to 23-Aug-2018)  Treated VT/VF 1 episode occurred 07/01/2018  AT/AF       110 episodes  Time in AT/AF  0.5 hr/day (2.2%)  VT-NS (>4 beats, >207 bpm)   1 episode  High Rate-NS        1 episode  Longest AT/AF      66 minutes  Observations (20-Jun-2018 to 23-Aug-2018)  AT/AF >= 6 hr for 1 days  Patient Activity less than 1 hr/day for 2 weeks.  Switched to ATP Before Charging due to 1 successes of ATP During Charging.       Heart failure questions reviewed. Patient asymptomatic.  She said she is doing fine and hopes to have total knee surgery in July or August.  Treated VT has been addressed by device clinic and sent to Dr Sallyanne Kuster for review.  Thoracic impedance normal.  Prescribed dosage:Torsemide 20 mg 1 tablet twice daily. Potassium 20 mEq 1 tablet daily  LABS: 03/23/2018 Creatinine 0.98, BUN 14, Potassium 4.3, Sodium 141, eGFR 60-70 12/08/2017 Creatinine 1.27. BUN 20, Potassium 4.3, Sodium 138, eGFR 44-51 09/17/2017 Creatinine 0.95, BUN 12, Potassium 4.2, Sodium 140, EGFR 63-72 07/23/2017 Creatinine 1.16, BUN 16, Potassium 3.8, Sodium 136, EGFR 48-56  Recommendations:No changes. Encouraged to call for fluid symptoms.   Follow-up plan: ICM clinic phone appointment on6/15/2020.  Copy of ICM check sent to Dr.Croitoru.   3 month ICM trend: 08/22/2018    1 Year ICM trend:       [name], RN 08/24/2018 1:02 PM

## 2018-08-24 NOTE — Progress Notes (Signed)
Ty!

## 2018-08-25 ENCOUNTER — Encounter: Payer: Self-pay | Admitting: Cardiovascular Disease

## 2018-08-30 ENCOUNTER — Encounter: Payer: Self-pay | Admitting: Sports Medicine

## 2018-08-30 ENCOUNTER — Ambulatory Visit: Payer: Medicare HMO | Admitting: Sports Medicine

## 2018-08-30 ENCOUNTER — Other Ambulatory Visit: Payer: Self-pay

## 2018-08-30 VITALS — Temp 97.5°F

## 2018-08-30 DIAGNOSIS — E1140 Type 2 diabetes mellitus with diabetic neuropathy, unspecified: Secondary | ICD-10-CM

## 2018-08-30 DIAGNOSIS — E0842 Diabetes mellitus due to underlying condition with diabetic polyneuropathy: Secondary | ICD-10-CM

## 2018-08-30 DIAGNOSIS — I739 Peripheral vascular disease, unspecified: Secondary | ICD-10-CM

## 2018-08-30 DIAGNOSIS — B351 Tinea unguium: Secondary | ICD-10-CM

## 2018-08-30 DIAGNOSIS — M79676 Pain in unspecified toe(s): Secondary | ICD-10-CM

## 2018-08-30 DIAGNOSIS — Z794 Long term (current) use of insulin: Secondary | ICD-10-CM

## 2018-08-30 DIAGNOSIS — L84 Corns and callosities: Secondary | ICD-10-CM | POA: Diagnosis not present

## 2018-08-30 NOTE — Progress Notes (Signed)
Subjective: Diane PainMary C Biederman is a 68 y.o. female patient with history of diabetes who presents to office today complaining of long,mildly painful nails  while ambulating in shoes; unable to trim. Patient states that the glucose reading this morning was 110 still on Victoza. No new issues besides consider knee surgery.   Last, A1c ~8. Next weeks will see PCP again.  Patient Active Problem List   Diagnosis Date Noted  . GERD (gastroesophageal reflux disease) 03/23/2018  . Chronic diastolic heart failure (HCC) 01/07/2018  . Mild obesity 01/07/2018  . Arthritis of carpometacarpal (CMC) joint of thumb 03/26/2016  . Primary localized osteoarthritis of right knee   . Visit for monitoring Tikosyn therapy 08/21/2015  . Long term current use of anticoagulant therapy 08/21/2015  . Ventricular tachycardia (HCC) 05/22/2015  . Obesity (BMI 30-39.9) 01/04/2014  . ICD (St. Jude Protecta dual-chamber),secondary prevention (VF arrest) January 2012 11/19/2012  . CKD (chronic kidney disease) stage 2, GFR 60-89 ml/min 02/22/2012  . Asthma, moderate persistent 12/10/2011  . Diabetic neuropathy (HCC) 09/18/2011  . Osteoarthritis of right knee 08/28/2008  . Type 2 diabetes, uncontrolled, with neuropathy (HCC) 06/10/2006  . HYPERCHOLESTEROLEMIA 06/10/2006  . Essential hypertension 06/10/2006  . Atrial fibrillation (HCC) 06/10/2006  . OSA on CPAP 06/10/2006   Current Outpatient Medications on File Prior to Visit  Medication Sig Dispense Refill  . bisoprolol (ZEBETA) 5 MG tablet TAKE 2 TABLETS BY MOUTH ONCE DAILY 60 tablet 5  . budesonide-formoterol (SYMBICORT) 160-4.5 MCG/ACT inhaler Inhale 2 puffs into the lungs 2 (two) times daily as needed. 1 Inhaler 3  . buPROPion (WELLBUTRIN XL) 150 MG 24 hr tablet Take 1 tablet (150 mg total) by mouth daily. 90 tablet 0  . dofetilide (TIKOSYN) 250 MCG capsule Take 1 capsule (250 mcg total) by mouth 2 (two) times daily. 180 capsule 2  . famotidine (PEPCID) 20 MG tablet Take  20 mg by mouth daily.    . fluticasone (FLONASE) 50 MCG/ACT nasal spray instill 1 spray into each nostril twice a day (Patient taking differently: Place 1 spray into both nostrils 2 (two) times daily as needed for allergies. ) 16 g 5  . glucose blood test strip Use as instructed 100 each 12  . insulin aspart (NOVOLOG FLEXPEN) 100 UNIT/ML FlexPen Inject 5-8 Units into the skin See admin instructions. Inject 5-8 units SQ before supper 15 mL 3  . Insulin Glargine (BASAGLAR KWIKPEN) 100 UNIT/ML SOPN Inject 0.4 mLs (40 Units total) into the skin daily. 15 mL 1  . Insulin Pen Needle (B-D ULTRAFINE III SHORT PEN) 31G X 8 MM MISC Use as directed up to 4 times daily to check blood sugar 100 each 9  . levalbuterol (XOPENEX HFA) 45 MCG/ACT inhaler INHALE 1 TO 2 PUFFS BY MOUTH INTO THE LUNGS EVERY 4 HOURS IF NEEDED FOR WHEEZING 15 Inhaler 5  . levalbuterol (XOPENEX) 0.63 MG/3ML nebulizer solution One vial in nebulizer four times daily as needed 360 mL 5  . montelukast (SINGULAIR) 10 MG tablet TAKE 1 TABLET BY MOUTH AT BEDTIME 30 tablet 5  . polyethylene glycol powder (GLYCOLAX/MIRALAX) powder take 17GM (DISSOLVED IN WATER) by mouth once daily THREE TIMES A WEEK if needed as directed (Patient taking differently: Mix 17 g in liquid and drink 3 times weekly) 500 g 3  . potassium chloride SA (K-DUR,KLOR-CON) 20 MEQ tablet Take 1 tablet (20 mEq total) by mouth daily as directed. (Patient taking differently: Take 20 mEq by mouth daily as needed (with Torsemide). ) 30  tablet 11  . PRODIGY LANCETS 28G MISC 1 Units by Does not apply route 4 (four) times daily. 100 each 4  . rosuvastatin (CRESTOR) 10 MG tablet TAKE 1 TABLET(10 MG) BY MOUTH DAILY 90 tablet 1  . torsemide (DEMADEX) 20 MG tablet TAKE 1 TABLET BY MOUTH TWICE A DAY AS DIRECTED (Patient taking differently: Take 20 mg by mouth daily. ) 180 tablet 1  . VICTOZA 18 MG/3ML SOPN ADMINISTER 1.8 MG UNDER THE SKIN DAILY 9 mL 3  . warfarin (COUMADIN) 5 MG tablet Take 1 to  1 and 1/2 daily as directed by coumadin clinic 135 tablet 1   No current facility-administered medications on file prior to visit.    Allergies  Allergen Reactions  . Avelox [Moxifloxacin Hcl In Nacl] Other (See Comments)    Cardiac arrest per pt  . Nsaids Other (See Comments)    Cardiac arrest per pt Cardiac arrest per pt  . Simvastatin Other (See Comments)    myalgias myalgias myalgias  . Moxifloxacin Other (See Comments)  . Ace Inhibitors Other (See Comments) and Cough    Dry cough  Dry cough  Dry cough   . Jardiance [Empagliflozin] Other (See Comments)    Felt "crazy", fatigue, sweating, denies hypoglycemia while taking  . Latex Rash    Recent Results (from the past 2160 hour(s))  POCT INR     Status: None   Collection Time: 06/13/18  9:15 AM  Result Value Ref Range   INR 2.0 2.0 - 3.0  POCT INR     Status: None   Collection Time: 07/19/18  9:54 AM  Result Value Ref Range   INR 2.7 2.0 - 3.0  CUP PACEART REMOTE DEVICE CHECK     Status: None   Collection Time: 08/23/18  7:33 AM  Result Value Ref Range   Date Time Interrogation Session [id number]    Pulse Generator Manufacturer MERM    Pulse Gen Model [id number] Evera MRI XT DR    Pulse Gen Serial Number [id number]    Clinic Name [facility]    Implantable Pulse Generator Type Implantable Cardiac Defibulator    Implantable Pulse Generator Implant Date [id number]    Implantable Lead Manufacturer [facility]    Implantable Lead Model 5076 CapSureFix Novus    Implantable Lead Serial Number [id number]    Implantable Lead Implant Date 66060045    Implantable Lead Location [id number]    Implantable Lead Manufacturer [facility]    Implantable Lead Model 6935 Sprint Quattro Secure S    Implantable Lead Serial Number [id number]    Implantable Lead Implant Date [id number]    Implantable Lead Location [id number]    Lead Channel Setting Sensing Sensitivity 0.3 mV   Lead Channel Setting Pacing Amplitude 2 V   Lead Channel Setting Pacing  Pulse Width 0.4 ms   Lead Channel Setting Pacing Amplitude 2.5 V   Lead Channel Impedance Value 437 ohm   Lead Channel Sensing Intrinsic Amplitude 1.875 mV   Lead Channel Sensing Intrinsic Amplitude 1.875 mV   Lead Channel Pacing Threshold Amplitude 0.5 V   Lead Channel Pacing Threshold Pulse Width 0.4 ms   Lead Channel Impedance Value 399 ohm   Lead Channel Impedance Value 380 ohm   Lead Channel Sensing Intrinsic Amplitude 12.625 mV   Lead Channel Sensing Intrinsic Amplitude 12.625 mV   Lead Channel Pacing Threshold Amplitude 0.75 V   Lead Channel Pacing Threshold Pulse Width 0.4 ms   HighPow Impedance 75 ohm   Battery Status  OK    Battery Remaining Longevity 104 mo   Battery Voltage 2.99 V   Brady Statistic RA Percent Paced 92.35 %   Brady Statistic RV Percent Paced 0.78 %   Brady Statistic AP VP Percent 0.68 %   Brady Statistic AS VP Percent 0.03 %   Brady Statistic AP VS Percent 94.88 %   Brady Statistic AS VS Percent 4.41 %    Objective: General: Patient is awake, alert, and oriented x 3 and in no acute distress.  Integument: Skin is warm, dry and supple bilateral. Nails are tender, long, thickened and dystrophic with subungual debris, consistent with onychomycosis, 1-5 bilateral. No signs of infection. + callus hallux, sub 3-4, and 5th toes bilateral. Remaining integument unremarkable.  Vasculature:  Dorsalis Pedis pulse 1/4 bilateral. Posterior Tibial pulse  1/4 bilateral.  Capillary fill time <3 sec 1-5 bilateral. Scant hair growth to the level of the digits. Temperature gradient within normal limits. No varicosities present bilateral. No edema present bilateral.   Neurology: The patient has intact sensation measured with a 5.07/10g Semmes Weinstein Monofilament at all pedal sites bilateral . Vibratory sensation diminished bilateral with tuning fork. No Babinski sign present bilateral.   Musculoskeletal:  Asymptomatic pes planus, bunion and hammertoe pedal deformities  noted bilateral. Muscular strength 5/5 in all lower extremity muscular groups bilateral without [name] on range of motion . No tenderness with calf compression bilateral.  Assessment and Plan: Problem List Items Addressed This Visit      Nervous and Auditory   Diabetic neuropathy (HCC) (Chronic)    Other Visit Diagnoses    [name] due to onychomycosis of nail    -  Primary   Corns and callosities       Peripheral arterial disease (HCC)       Type 2 diabetes mellitus with diabetic neuropathy, with long-term current use of insulin (HCC)         -Examined patient. -Discussed and educated patient on diabetic foot care, especially with  regards to the vascular, neurological and musculoskeletal systems.  -Mechanically debrided callus >5 using sterile chisel blade and debrided all nails 1-5 bilateral using sterile nail nipper and filed with dremel without incident  -Continue with bag balm skin emollient of help soften callus areas -Patient to return  in 3 months for at risk foot care -Patient advised to call the office if any problems or questions arise in the meantime.  Asencion Islam, DPM

## 2018-09-06 ENCOUNTER — Other Ambulatory Visit: Payer: Self-pay | Admitting: Cardiovascular Disease

## 2018-09-06 NOTE — Telephone Encounter (Signed)
Rx request sent to pharmacy.  

## 2018-09-07 ENCOUNTER — Other Ambulatory Visit: Payer: Self-pay

## 2018-09-07 DIAGNOSIS — E1140 Type 2 diabetes mellitus with diabetic neuropathy, unspecified: Secondary | ICD-10-CM

## 2018-09-07 DIAGNOSIS — IMO0002 Reserved for concepts with insufficient information to code with codable children: Secondary | ICD-10-CM

## 2018-09-08 MED ORDER — BASAGLAR KWIKPEN 100 UNIT/ML ~~LOC~~ SOPN: 40.0000 [IU] | PEN_INJECTOR | Freq: Every day | SUBCUTANEOUS | 1 refills | Status: DC

## 2018-09-09 ENCOUNTER — Other Ambulatory Visit: Payer: Self-pay

## 2018-09-09 ENCOUNTER — Encounter: Payer: Self-pay | Admitting: Nurse Practitioner

## 2018-09-09 ENCOUNTER — Ambulatory Visit: Payer: Medicare HMO | Admitting: Nurse Practitioner

## 2018-09-09 ENCOUNTER — Telehealth: Payer: Self-pay

## 2018-09-09 VITALS — BP 128/86 | HR 77 | Ht 66.0 in | Wt 203.0 lb

## 2018-09-09 DIAGNOSIS — Z9989 Dependence on other enabling machines and devices: Secondary | ICD-10-CM

## 2018-09-09 DIAGNOSIS — N182 Chronic kidney disease, stage 2 (mild): Secondary | ICD-10-CM | POA: Diagnosis not present

## 2018-09-09 DIAGNOSIS — I5022 Chronic systolic (congestive) heart failure: Secondary | ICD-10-CM | POA: Diagnosis not present

## 2018-09-09 DIAGNOSIS — Z79899 Other long term (current) drug therapy: Secondary | ICD-10-CM

## 2018-09-09 DIAGNOSIS — I472 Ventricular tachycardia, unspecified: Secondary | ICD-10-CM

## 2018-09-09 DIAGNOSIS — G4733 Obstructive sleep apnea (adult) (pediatric): Secondary | ICD-10-CM | POA: Diagnosis not present

## 2018-09-09 DIAGNOSIS — I480 Paroxysmal atrial fibrillation: Secondary | ICD-10-CM | POA: Diagnosis not present

## 2018-09-09 LAB — CUP PACEART INCLINIC DEVICE CHECK
Battery Remaining Longevity: 104 mo
Battery Voltage: 3 V
Brady Statistic AP VP Percent: 1.26 %
Brady Statistic AP VS Percent: 94.25 %
Brady Statistic AS VP Percent: 0.05 %
Brady Statistic AS VS Percent: 4.44 %
Brady Statistic RA Percent Paced: 91.88 %
Brady Statistic RV Percent Paced: 1.36 %
Date Time Interrogation Session: 20200529092107
HighPow Impedance: 78 Ohm
Implantable Lead Implant Date: 20120120
Implantable Lead Implant Date: 20120120
Implantable Lead Location: 753859
Implantable Lead Location: 753860
Implantable Lead Model: 5076
Implantable Lead Model: 6935
Implantable Pulse Generator Implant Date: 20190618
Lead Channel Impedance Value: 380 Ohm
Lead Channel Impedance Value: 456 Ohm
Lead Channel Impedance Value: 456 Ohm
Lead Channel Pacing Threshold Amplitude: 0.375 V
Lead Channel Pacing Threshold Amplitude: 0.625 V
Lead Channel Pacing Threshold Pulse Width: 0.4 ms
Lead Channel Pacing Threshold Pulse Width: 0.4 ms
Lead Channel Sensing Intrinsic Amplitude: 1 mV
Lead Channel Sensing Intrinsic Amplitude: 1.25 mV
Lead Channel Sensing Intrinsic Amplitude: 11.125 mV
Lead Channel Sensing Intrinsic Amplitude: 16.25 mV
Lead Channel Setting Pacing Amplitude: 2 V
Lead Channel Setting Pacing Amplitude: 2.5 V
Lead Channel Setting Pacing Pulse Width: 0.4 ms
Lead Channel Setting Sensing Sensitivity: 0.3 mV

## 2018-09-09 LAB — BASIC METABOLIC PANEL
BUN/Creatinine Ratio: 12 (ref 12–28)
BUN: 13 mg/dL (ref 8–27)
CO2: 25 mmol/L (ref 20–29)
Calcium: 8.8 mg/dL (ref 8.7–10.3)
Chloride: 101 mmol/L (ref 96–106)
Creatinine, Ser: 1.08 mg/dL — ABNORMAL HIGH (ref 0.57–1.00)
GFR calc Af Amer: 61 mL/min/{1.73_m2} (ref >59)
GFR calc non Af Amer: 53 mL/min/{1.73_m2} — ABNORMAL LOW (ref >59)
Glucose: 168 mg/dL — ABNORMAL HIGH (ref 65–99)
Potassium: 4.2 mmol/L (ref 3.5–5.2)
Sodium: 140 mmol/L (ref 134–144)

## 2018-09-09 LAB — CBC
Hematocrit: 39.3 % (ref 34.0–46.6)
Hemoglobin: 12.8 g/dL (ref 11.1–15.9)
MCH: 28.4 pg (ref 26.6–33.0)
MCHC: 32.6 g/dL (ref 31.5–35.7)
MCV: 87 fL (ref 79–97)
Platelets: 211 10*3/uL (ref 150–450)
RBC: 4.51 x10E6/uL (ref 3.77–5.28)
RDW: 13.1 % (ref 11.7–15.4)
WBC: 5.9 10*3/uL (ref 3.4–10.8)

## 2018-09-09 LAB — MAGNESIUM: Magnesium: 2.2 mg/dL (ref 1.6–2.3)

## 2018-09-09 NOTE — Progress Notes (Signed)
Remote ICD transmission.   

## 2018-09-09 NOTE — Telephone Encounter (Signed)

## 2018-09-09 NOTE — Progress Notes (Signed)
Electrophysiology Office Note Date: 09/09/2018  ID:  Allix, Blomquist March 23, 1951, MRN [id number]  PCP: Garth Bigness, MD Primary Cardiologist: Croitoru Electrophysiologist: Allred  CC: Routine ICD follow-up  [name] is a 68 y.o. female seen today for Dr Johney Frame and Dr Royann Shivers.  She presents today for routine electrophysiology followup.  Since last being seen in our clinic, the patient reports doing reasonably well. She denies chest pain, palpitations, dyspnea, PND, orthopnea, nausea, vomiting, syncope, edema, weight gain, or early satiety. She has periods of fatigue and dizziness that last for about 10 minutes at a time. She is occasionally pre-syncopal with these. She feels that she is in AF with these episodes.   She has not had ICD shocks. She did have a recent episode of VT that was ATP terminated.   Device History: MDT dual chamber ICD implanted 2012 for NICM, VF arrest History of appropriate therapy: Yes  (ATP) History of AAD therapy: yes - Tikosyn for AF   Past Medical History:  Diagnosis Date  . Anemia   . Anxiety   . Arthritis   . Asthma    has had multiple hospitalizations for this  . Atrial fibrillation (HCC)    ablation x 2 [facility], 01/2006, 2011.  on warfarin  . Cardiac arrest [facility]) jan 2012   in hospital for pneumonia when this occured- occured at the hospital  . CHF (congestive heart failure) (HCC) 2012   Echo 08/08/10 by [facility]. EF 35-45%. LV systolic function moderately reduced. Moderate global hypokinesis of LV.  RV systolic function moderately reduced. Mild MR. Trace AR.  Marland Kitchen Complication of anesthesia    difficult time waking up after anesthesia  . Depression   . Diabetes mellitus    Type 2  . GASTROESOPHAGEAL REFLUX, NO ESOPHAGITIS 06/10/2006   Qualifier: Diagnosis of  By: Abundio Miu    . History of hiatal hernia   . Hyperlipidemia   . Hypertension   . Limb pain 06/04/2008   LLE, Baker's cyst in popliteal fossa, no DVT  .  Non-ischemic cardiomyopathy (HCC)    echo 08/08/10 - EF 35-45% LV and RV systolic function mod reduced  . Primary localized osteoarthritis of right knee   . Sleep apnea    wears CPAP nightly  . Vocal cord disease    Past Surgical History:  Procedure Laterality Date  . BREAST EXCISIONAL BIOPSY Right 1999  . BREAST LUMPECTOMY Right   . CARDIAC DEFIBRILLATOR PLACEMENT  05/02/10   Medtronic Protecta XT-DR for CHF-VT, last download 04/12/12  . CHOLECYSTECTOMY    . COLONOSCOPY    . HAMMER TOE SURGERY Right   . ICD GENERATOR CHANGEOUT N/A 09/28/2017   Procedure: ICD GENERATOR CHANGEOUT;  Surgeon: Thurmon Fair, MD;  Location: [facility];  Service: Cardiovascular;  Laterality: N/A;  . KNEE ARTHROSCOPY Right   . PAF Ablation     By Dr Sampson Goon. Now sees Dr Steele Berg at [facility]  . TOTAL KNEE ARTHROPLASTY Left 09/17/2014   Procedure: TOTAL KNEE ARTHROPLASTY;  Surgeon: Salvatore Marvel, MD;  Location: [facility] OR;  Service: Orthopedics;  Laterality: Left;  pt has ICD  . TUBAL LIGATION      Current Outpatient Medications  Medication Sig Dispense Refill  . bisoprolol (ZEBETA) 5 MG tablet TAKE 2 TABLETS BY MOUTH ONCE DAILY 60 tablet 5  . budesonide-formoterol (SYMBICORT) 160-4.5 MCG/ACT inhaler Inhale 2 puffs into the lungs 2 (two) times daily as needed. 1 Inhaler 3  . buPROPion [facility]  XL) 150 MG 24 hr tablet Take 1 tablet (150 mg total) by mouth daily. 90 tablet 0  . dofetilide (TIKOSYN) 250 MCG capsule Take 1 capsule (250 mcg total) by mouth 2 (two) times daily. 180 capsule 2  . famotidine (PEPCID) 20 MG tablet Take 20 mg by mouth daily.    . fluticasone (FLONASE) 50 MCG/ACT nasal spray instill 1 spray into each nostril twice a day 16 g 5  . glucose blood test strip Use as instructed 100 each 12  . insulin aspart (NOVOLOG FLEXPEN) 100 UNIT/ML FlexPen Inject 5-8 Units into the skin See admin instructions. Inject 5-8 units SQ before supper 15 mL 3  . Insulin Glargine (BASAGLAR KWIKPEN) 100  UNIT/ML SOPN Inject 0.4 mLs (40 Units total) into the skin daily. 15 mL 1  . Insulin Pen Needle (B-D ULTRAFINE III SHORT PEN) 31G X 8 MM MISC Use as directed up to 4 times daily to check blood sugar 100 each 9  . levalbuterol (XOPENEX HFA) 45 MCG/ACT inhaler INHALE 1 TO 2 PUFFS BY MOUTH INTO THE LUNGS EVERY 4 HOURS IF NEEDED FOR WHEEZING 15 Inhaler 5  . levalbuterol (XOPENEX) 0.63 MG/3ML nebulizer solution One vial in nebulizer four times daily as needed 360 mL 5  . montelukast (SINGULAIR) 10 MG tablet TAKE 1 TABLET BY MOUTH AT BEDTIME 30 tablet 5  . polyethylene glycol powder (GLYCOLAX/MIRALAX) powder take 17GM (DISSOLVED IN WATER) by mouth once daily THREE TIMES A WEEK if needed as directed (Patient taking differently: Mix 17 g in liquid and drink 3 times weekly) 500 g 3  . potassium chloride SA (K-DUR,KLOR-CON) 20 MEQ tablet Take 1 tablet (20 mEq total) by mouth daily as directed. 30 tablet 11  . PRODIGY LANCETS 28G MISC 1 Units by Does not apply route 4 (four) times daily. 100 each 4  . rosuvastatin (CRESTOR) 10 MG tablet TAKE 1 TABLET(10 MG) BY MOUTH DAILY 90 tablet 1  . torsemide (DEMADEX) 20 MG tablet TAKE 1 TABLET BY MOUTH TWICE A DAY AS DIRECTED 180 tablet 1  . VICTOZA 18 MG/3ML SOPN ADMINISTER 1.8 MG UNDER THE SKIN DAILY 9 mL 3  . warfarin (COUMADIN) 5 MG tablet Take 1 to 1 and 1/2 daily as directed by coumadin clinic 135 tablet 1   No current facility-administered medications for this visit.     Allergies:   Avelox [moxifloxacin hcl in nacl]; Nsaids; Simvastatin; Moxifloxacin; Ace inhibitors; Jardiance [empagliflozin]; and Latex   Social History: Social History   Socioeconomic History  . Marital status: Widowed    Spouse name: Not on file  . Number of children: Not on file  . Years of education: Not on file  . Highest education level: Not on file  Occupational History  . Not on file  Social Needs  . Financial resource strain: Not on file  . Food insecurity:    Worry: Not on  file    Inability: Not on file  . Transportation needs:    Medical: Not on file    Non-medical: Not on file  Tobacco Use  . Smoking status: Never Smoker  . Smokeless tobacco: Never Used  Substance and Sexual Activity  . Alcohol use: No  . Drug use: No  . Sexual activity: Never  Lifestyle  . Physical activity:    Days per week: Not on file    Minutes per session: Not on file  . Stress: Not on file  Relationships  . Social connections:    Talks on phone:  Not on file    Gets together: Not on file    Attends religious service: Not on file    Active member of club or organization: Not on file    Attends meetings of clubs or organizations: Not on file    Relationship status: Not on file  . Intimate partner violence:    Fear of current or ex partner: Not on file    Emotionally abused: Not on file    Physically abused: Not on file    Forced sexual activity: Not on file  Other Topics Concern  . Not on file  Social History Narrative   Not employed   Exercise- walking 1 hour daily.    Diet- eating healthy diet.       [facility]:   She is originally from [facility]. Has always lived in [place]. 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 Currently has a small dog. No bird or mold exposure.       Current Social History 04/08/2017        Patient lives with daughter, Lafonda Mosses, in two level home 04/08/2017   Transportation: Patient has own vehicle and drives herself 04/08/2017   Important Relationships Daughter, Lafonda Mosses 04/08/2017    Pets: Shiatzu Evaristo Bury)  04/08/2017   Education / Work:  10 th grade/ None 04/08/2017   Interests / Fun: Read, do puzzles 04/08/2017   Current Stressors: None because she prays to God 04/08/2017   Religious / Personal Beliefs: Non-Denominational 04/08/2017   L. Ducatte, RN, BSN                                                                                                   Family History: Family History  Problem Relation Age of Onset  . Diabetes Father   . Hypertension Father   . Diabetes Brother   . Hypertension Brother   . Lung disease Neg Hx   . Cancer Neg Hx   . Rheumatologic disease Neg Hx     Review of Systems: All other systems reviewed and are otherwise negative except as noted above.   Physical Exam: VS:  BP 128/86   Pulse 77   Ht 5\' 6"  (1.676 m)   Wt 203 lb (92.1 kg)   LMP 07/26/2011   SpO2 99%   BMI 32.77 kg/m  , BMI Body mass index is 32.77 kg/m.  GEN- The patient is well appearing, alert and oriented x 3 today.   HEENT: normocephalic, atraumatic; sclera clear, conjunctiva pink; hearing intact; oropharynx clear; neck supple Lungs- Clear to ausculation bilaterally, normal work of breathing.  No wheezes, rales, rhonchi Heart- Regular rate and rhythm GI- soft, non-tender, non-distended, bowel sounds present Extremities- no clubbing, cyanosis, or edema; DP/PT/radial pulses 2+ bilaterally MS- no significant deformity or atrophy Skin- warm and dry, no rash or lesion; ICD pocket well healed Psych- euthymic mood, full affect Neuro- strength and sensation are intact  ICD interrogation- reviewed in detail today,  See PACEART report  EKG:  EKG is  ordered today. The ekg ordered today shows atrial pacing with intrinsic conduction, rate 77, QTc  Recent Labs: 03/23/2018: Hemoglobin 11.9; Platelets 209 05/23/2018: BUN 17; Creatinine, Ser 1.17; Magnesium 2.0; Potassium 4.5; Sodium 141   Wt Readings from Last 3 Encounters:  09/09/18 203 lb (92.1 kg)  07/18/18 196 lb 6.4 oz (89.1 kg)  06/28/18 200 lb (90.7 kg)     Other studies Reviewed: Additional studies/ records that were reviewed today include: Dr Renaye Rakers office notes   Assessment and Plan:  1.  Chronic systolic dysfunction euvolemic today Stable on an appropriate medical regimen Normal ICD function See Pace Art report No changes today Will update echo    2.  VT Recent episode of ATP terminated VT Episode was monomorphic Will update labs today EKG stable Continue Tikosyn for AF management No driving x6 months - pt notified  3.  Paroxysmal atrial fibrillation Burden by device interrogation 2.6% V rates elevated in AF - I think this is causing her symptomatic episodes. Will increase Bisoprolol to 10mg  qam and 5mg  qpm (discussed with Dr Johney Frame today) Continue Tikosyn BMET, Mg today QTc stable Continue Warfarin for CHADS2VASC of 4 She has had prior PVI's at [facility]. I am not sure that repeat PVI would be beneficial. Will see how she does with increased Bisoprolol.  Follow up with Dr Johney Frame in 6 weeks.   4.  CKD BMET today   5.  HTN Stable No change required today  6.  OSA Compliant with CPAP    Current medicines are reviewed at length with the patient today.   The patient does not have concerns regarding her medicines.  The following changes were made today:  Increase Bisoprolol to 10mg  qam and 5mg  qpm  Labs/ tests ordered today include: CBC, BMET, Mg Orders Placed This Encounter  Procedures  . CBC  . Basic Metabolic Panel (BMET)  . Magnesium  . CUP PACEART INCLINIC DEVICE CHECK  . EKG 12-Lead  . ECHOCARDIOGRAM COMPLETE     Disposition:   Follow up with Dr Johney Frame in 6 weeks, [facility], ICM clinic     Signed, Gypsy Balsam, NP 09/09/2018 9:30 AM  [facility] [address] [place] [address] [phone] (office) [phone] (fax)

## 2018-09-09 NOTE — Patient Instructions (Addendum)
Medication Instructions:  BISOPROLOL (zebeta) 10mg  IN MORNING;  5mg  IN EVENING If you need a refill on your cardiac medications before your next appointment, please call your pharmacy.   Lab work:TODAY CBC BMET MAGNESIUM If you have labs (blood work) drawn today and your tests are completely normal, you will receive your results only by: Marland Kitchen MyChart Message (if you have MyChart) OR . A paper copy in the mail If you have any lab test that is abnormal or we need to change your treatment, we will call you to review the results.  Testing/Procedures:Please schedule prior to Dr Johney Frame appointment   Your physician has requested that you have an echocardiogram. Echocardiography is a painless test that uses sound waves to create images of your heart. It provides your doctor with information about the size and shape of your heart and how well your heart's chambers and valves are working. This procedure takes approximately one hour. There are no restrictions for this procedure.    Follow-Up: 6 WEEKS with Dr Johney Frame (please schedule) At [facility], you and your health needs are our priority.  As part of our continuing mission to provide you with exceptional heart care, we have created designated Provider Care Teams.  These Care Teams include your primary Cardiologist (physician) and Advanced Practice Providers (APPs -  Physician Assistants and Nurse Practitioners) who all work together to provide you with the care you need, when you need it. .   Any Other Special Instructions Will Be Listed Below (If Applicable). Remote monitoring is used to monitor your ICD from home. This monitoring reduces the number of office visits required to check your device to one time per year. It allows [place] to keep an eye on the functioning of your device to ensure it is working properly. You are scheduled for a device check from home on 11/23/18. You may send your transmission at any time that day. If you have a wireless  device, the transmission will be sent automatically. After your physician reviews your transmission, you will receive a postcard with your next transmission date.

## 2018-09-12 ENCOUNTER — Telehealth: Payer: Self-pay

## 2018-09-12 NOTE — Telephone Encounter (Signed)
Notes recorded by Sigurd Sos, RN on 09/12/2018 at 12:55 PM EDT The patient has been notified of the result and verbalized understanding. All questions (if any) were answered. Sigurd Sos, RN 09/12/2018 12:55 PM   ------

## 2018-09-12 NOTE — Telephone Encounter (Signed)
-----   Message from Marily Lente, NP sent at 09/11/2018 10:24 AM EDT ----- Please notify patient of stable labs. Thanks!

## 2018-09-13 ENCOUNTER — Other Ambulatory Visit: Payer: Self-pay

## 2018-09-13 ENCOUNTER — Ambulatory Visit (INDEPENDENT_AMBULATORY_CARE_PROVIDER_SITE_OTHER): Payer: Medicare HMO | Admitting: Pharmacist

## 2018-09-13 DIAGNOSIS — Z7901 Long term (current) use of anticoagulants: Secondary | ICD-10-CM | POA: Diagnosis not present

## 2018-09-13 DIAGNOSIS — I480 Paroxysmal atrial fibrillation: Secondary | ICD-10-CM

## 2018-09-13 DIAGNOSIS — I4891 Unspecified atrial fibrillation: Secondary | ICD-10-CM | POA: Diagnosis not present

## 2018-09-13 LAB — POCT INR: INR: 3.1 — AB (ref 2.0–3.0)

## 2018-09-16 ENCOUNTER — Telehealth (HOSPITAL_COMMUNITY): Payer: Self-pay | Admitting: *Deleted

## 2018-09-16 NOTE — Telephone Encounter (Signed)
COVID-19 Pre-Screening Questions:  . Do you currently have a fever? NO (yes = cancel and refer to pcp for e-visit) . Have you recently travelled on a cruise, internationally, or to [place], [place], [place], [place], [place], or [place], [place] ([place]) ? NO (yes = cancel, stay home, monitor symptoms, and contact pcp or initiate e-visit if symptoms develop) . Have you been in contact with someone that is currently pending confirmation of Covid19 testing or has been confirmed to have the Covid19 virus?  NO (yes = cancel, stay home, away from tested individual, monitor symptoms, and contact pcp or initiate e-visit if symptoms develop) . Are you currently experiencing fatigue or cough? NO (yes = pt should be prepared to have a mask placed at the time of their visit).   . Reiterated no additional visitors. . Arrive no earlier than 15 minutes before appointment time. . Please bring own mask.  Audris Speaker 

## 2018-09-19 ENCOUNTER — Other Ambulatory Visit: Payer: Self-pay

## 2018-09-19 ENCOUNTER — Ambulatory Visit (HOSPITAL_COMMUNITY): Payer: Medicare HMO | Attending: Cardiology

## 2018-09-19 DIAGNOSIS — Z79899 Other long term (current) drug therapy: Secondary | ICD-10-CM

## 2018-09-19 DIAGNOSIS — I480 Paroxysmal atrial fibrillation: Secondary | ICD-10-CM | POA: Diagnosis not present

## 2018-09-21 ENCOUNTER — Telehealth: Payer: Self-pay

## 2018-09-21 NOTE — Telephone Encounter (Signed)
-----   Message from Diane Berthold, NP sent at 09/20/2018  9:49 AM EDT ----- Please notify patient of stable echo. Thanks!

## 2018-09-21 NOTE — Telephone Encounter (Signed)
Notes recorded by Frederik Schmidt, RN on 09/21/2018 at 9:01 AM EDT The patient has been notified of the Echo result and verbalized understanding. All questions (if any) were answered. Frederik Schmidt, RN 09/21/2018 9:01 AM   ------

## 2018-09-26 ENCOUNTER — Ambulatory Visit (INDEPENDENT_AMBULATORY_CARE_PROVIDER_SITE_OTHER): Payer: Medicare HMO

## 2018-09-26 DIAGNOSIS — Z95810 Presence of automatic (implantable) cardiac defibrillator: Secondary | ICD-10-CM

## 2018-09-26 DIAGNOSIS — I5022 Chronic systolic (congestive) heart failure: Secondary | ICD-10-CM | POA: Diagnosis not present

## 2018-09-26 NOTE — Progress Notes (Signed)
EPIC Encounter for ICM Monitoring  Patient Name: [name] is a 68 y.o. female Date: 09/26/2018 Primary Care Physican: Sela Hilding, MD Primary Cardiologist:Croitoru Electrophysiologist: Croitoru Last Weight:198lbs 08/24/2018 Weight:196 - 200 lbs 09/26/2018 Weight: 201 lbs  Clinical Status (09-Sep-2018 to 26-Sep-2018) AT/AF    225 episodes Time in AT/AF   1.6 hr/day (6.7%) Longest AT/AF    40 minutes  Observations (2) (09-Sep-2018 to 26-Sep-2018) AT/AF >= 6 hr for 1 days. Avg. Ventricular Rate >= 100 bpm during AT/AF (>= 6 hr) for 1 days        Heart failure questions reviewed. Patient has not experienced fluid symptoms but can tell when she is Afib because she has dizziness and fatigue.    Optivol thoracic impedance suggesting possible fluid accumulation since 09/19/2018.  Chanetta Marshall, NP noted at 09/09/2018 OV that burden by device interrogation 2.6%.  Prescribed dosage:Torsemide 20 mg 1 tablet twice daily. Potassium 20 mEq 1 tablet daily  LABS: 03/23/2018 Creatinine 0.98, BUN 14, Potassium 4.3, Sodium 141, GFR 60-70 12/08/2017 Creatinine 1.27. BUN 20, Potassium 4.3, Sodium 138, GFR 44-51 09/17/2017 Creatinine 0.95, BUN 12, Potassium 4.2, Sodium 140, GFR 63-72 07/23/2017 Creatinine 1.16, BUN 16, Potassium 3.8, Sodium 136, GFR 48-56  Recommendations:Advised to review foods for salt amounts and limit to 2000 mg daily and fluid intake to 64 oz daily.  Follow-up plan: ICM clinic phone appointment on6/22/2020 to recheck fluid levels.  Virtual visit scheduled with Dr Rayann Heman 10/19/2018.  Copy of ICM check sent to Dr.Croitoru and Chanetta Marshall, NP.    AT/AF   3 month ICM trend: 09/26/2018    1 Year ICM trend:       [name], RN 09/26/2018 4:08 PM

## 2018-10-03 ENCOUNTER — Ambulatory Visit (INDEPENDENT_AMBULATORY_CARE_PROVIDER_SITE_OTHER): Payer: Medicare HMO

## 2018-10-03 DIAGNOSIS — Z95810 Presence of automatic (implantable) cardiac defibrillator: Secondary | ICD-10-CM

## 2018-10-03 DIAGNOSIS — I5022 Chronic systolic (congestive) heart failure: Secondary | ICD-10-CM

## 2018-10-05 NOTE — Progress Notes (Signed)
EPIC Encounter for ICM Monitoring  Patient Name: [name] is a 68 y.o. female Date: 10/05/2018 Primary Care Physican: Sela Hilding, MD Primary Cardiologist:Croitoru Electrophysiologist: Croitoru Last Weight:198lbs 5/13/2020Weight:196 - 200lbs 09/26/2018 Weight: 201 lbs 10/05/2018 Weight: 199 lbs  Clinical Status Since 26-Sep-2018 Time in AT/AF0.0 hr/day (0.0%)    Heart failure questions reviewed. Patient has not experienced fluid symptoms but can tell when she is Afib because she has dizziness and fatigue.   Optivol thoracic impedance returned to normal.   Prescribed dosage:Torsemide 20 mg 1 tablet twice daily. Potassium 20 mEq 1 tablet daily  LABS: 03/23/2018 Creatinine 0.98, BUN 14, Potassium 4.3, Sodium 141, GFR 60-70 12/08/2017 Creatinine 1.27. BUN 20, Potassium 4.3, Sodium 138, GFR 44-51 09/17/2017 Creatinine 0.95, BUN 12, Potassium 4.2, Sodium 140, GFR 63-72 07/23/2017 Creatinine 1.16, BUN 16, Potassium 3.8, Sodium 136, GFR 48-56  Recommendations:Advised to review foods for salt amounts and limit to 2000 mg daily and fluid intake to 64 oz daily.  Follow-up plan: ICM clinic phone appointment on7/13/2020.  Virtual visit scheduled with Dr Rayann Heman 10/19/2018.  Copy of ICM check sent to Dr.Croitoru.  3 month ICM trend: 10/03/2018    1 Year ICM trend:       [name], RN 10/05/2018 5:08 PM

## 2018-10-12 ENCOUNTER — Telehealth: Payer: Self-pay

## 2018-10-18 ENCOUNTER — Ambulatory Visit: Payer: Medicare HMO | Admitting: Cardiovascular Disease

## 2018-10-18 NOTE — Telephone Encounter (Signed)
Spoke with pt about appt on 10/19/18. Pt stated she will check her vitals prior to her appt and did not have any questions at this time.

## 2018-10-19 ENCOUNTER — Encounter: Payer: Self-pay | Admitting: Internal Medicine

## 2018-10-19 ENCOUNTER — Telehealth (INDEPENDENT_AMBULATORY_CARE_PROVIDER_SITE_OTHER): Payer: Medicare HMO | Admitting: Internal Medicine

## 2018-10-19 VITALS — BP 106/72 | HR 84 | Ht 66.0 in | Wt 202.0 lb

## 2018-10-19 DIAGNOSIS — I472 Ventricular tachycardia, unspecified: Secondary | ICD-10-CM

## 2018-10-19 DIAGNOSIS — Z9989 Dependence on other enabling machines and devices: Secondary | ICD-10-CM

## 2018-10-19 DIAGNOSIS — I480 Paroxysmal atrial fibrillation: Secondary | ICD-10-CM | POA: Diagnosis not present

## 2018-10-19 DIAGNOSIS — I110 Hypertensive heart disease with heart failure: Secondary | ICD-10-CM | POA: Diagnosis not present

## 2018-10-19 DIAGNOSIS — Z7901 Long term (current) use of anticoagulants: Secondary | ICD-10-CM | POA: Diagnosis not present

## 2018-10-19 DIAGNOSIS — G4733 Obstructive sleep apnea (adult) (pediatric): Secondary | ICD-10-CM

## 2018-10-19 DIAGNOSIS — I5032 Chronic diastolic (congestive) heart failure: Secondary | ICD-10-CM | POA: Diagnosis not present

## 2018-10-19 DIAGNOSIS — I5022 Chronic systolic (congestive) heart failure: Secondary | ICD-10-CM

## 2018-10-19 NOTE — Progress Notes (Signed)
Electrophysiology TeleHealth Note   Due to national recommendations of social distancing due to COVID 19, an audio/video telehealth visit is felt to be most appropriate for this patient at this time.  See MyChart message from today for the patient's consent to telehealth for [facility].  Date:  10/19/2018   ID:  Diane PainMary C [name], DOB 03/24/1951, MRN [id number]  Location: patient's home  [facility]  Evaluation Performed: Follow-up visit  PCP:  Unknown JimMeccariello, Bailey J, DO   Electrophysiologist:  Dr Johney FrameAllred  Chief Complaint:  palpitations  History of Present Illness:    [name] is a 68 y.o. female who presents via telehealth conferencing today.  Since last being seen in our clinic, the patient reports doing very well.  Today, she denies symptoms of palpitations, chest pain, shortness of breath,  lower extremity edema, dizziness, presyncope, or syncope.  The patient is otherwise without complaint today.  The patient denies symptoms of fevers, chills, cough, or new SOB worrisome for COVID 19.  Past Medical History:  Diagnosis Date  . Anemia   . Anxiety   . Arthritis   . Asthma    has had multiple hospitalizations for this  . Atrial fibrillation (HCC)    ablation x 2 [facility], 01/2006, 2011.  on warfarin  . Cardiac arrest [facility](HCC) jan 2012   in hospital for pneumonia when this occured- occured at the hospital  . CHF (congestive heart failure) (HCC) 2012   Echo 08/08/10 by [facility]. EF 35-45%. LV systolic function moderately reduced. Moderate global hypokinesis of LV.  RV systolic function moderately reduced. Mild MR. Trace AR.  Marland Kitchen. Complication of anesthesia    difficult time waking up after anesthesia  . Depression   . Diabetes mellitus    Type 2  . GASTROESOPHAGEAL REFLUX, NO ESOPHAGITIS 06/10/2006   Qualifier: Diagnosis of  By: Abundio MiuMcGregor, Barbara    . History of hiatal hernia   . Hyperlipidemia   . Hypertension   . Limb pain 06/04/2008   LLE,  Baker's cyst in popliteal fossa, no DVT  . Non-ischemic cardiomyopathy (HCC)    echo 08/08/10 - EF 35-45% LV and RV systolic function mod reduced  . Primary localized osteoarthritis of right knee   . Sleep apnea    wears CPAP nightly  . Vocal cord disease     Past Surgical History:  Procedure Laterality Date  . BREAST EXCISIONAL BIOPSY Right 1999  . BREAST LUMPECTOMY Right   . CARDIAC DEFIBRILLATOR PLACEMENT  05/02/10   Medtronic Protecta XT-DR for CHF-VT, last download 04/12/12  . CHOLECYSTECTOMY    . COLONOSCOPY    . HAMMER TOE SURGERY Right   . ICD GENERATOR CHANGEOUT N/A 09/28/2017   Procedure: ICD GENERATOR CHANGEOUT;  Surgeon: Thurmon Fairroitoru, Mihai, MD;  Location: [facility];  Service: Cardiovascular;  Laterality: N/A;  . KNEE ARTHROSCOPY Right   . PAF Ablation     By Dr Sampson GoonFitzgerald. [facility]  . TOTAL KNEE ARTHROPLASTY Left 09/17/2014   Procedure: TOTAL KNEE ARTHROPLASTY;  Surgeon: Salvatore Marvelobert Wainer, MD;  [facility];  Service: Orthopedics;  Laterality: Left;  pt has ICD  . TUBAL LIGATION      Current Outpatient Medications  Medication Sig Dispense Refill  . bisoprolol (ZEBETA) 5 MG tablet TAKE 2 TABLETS BY MOUTH ONCE DAILY (Patient taking differently: Take 5 mg by mouth daily. 2 tablets in th morning and 1 tablet at night) 60 tablet  5  . budesonide-formoterol (SYMBICORT) 160-4.5 MCG/ACT inhaler Inhale 2 puffs into the lungs 2 (two) times daily as needed. 1 Inhaler 3  . buPROPion (WELLBUTRIN XL) 150 MG 24 hr tablet Take 1 tablet (150 mg total) by mouth daily. 90 tablet 0  . dofetilide (TIKOSYN) 250 MCG capsule Take 1 capsule (250 mcg total) by mouth 2 (two) times daily. 180 capsule 2  . famotidine (PEPCID) 20 MG tablet Take 20 mg by mouth daily.    . fluticasone (FLONASE) 50 MCG/ACT nasal spray instill 1 spray into each nostril twice a day 16 g 5  . glucose blood test strip Use as instructed 100 each 12  . insulin aspart (NOVOLOG FLEXPEN) 100 UNIT/ML  FlexPen Inject 5-8 Units into the skin See admin instructions. Inject 5-8 units SQ before supper 15 mL 3  . Insulin Glargine (BASAGLAR KWIKPEN) 100 UNIT/ML SOPN Inject 0.4 mLs (40 Units total) into the skin daily. 15 mL 1  . Insulin Pen Needle (B-D ULTRAFINE III SHORT PEN) 31G X 8 MM MISC Use as directed up to 4 times daily to check blood sugar 100 each 9  . levalbuterol (XOPENEX HFA) 45 MCG/ACT inhaler INHALE 1 TO 2 PUFFS BY MOUTH INTO THE LUNGS EVERY 4 HOURS IF NEEDED FOR WHEEZING 15 Inhaler 5  . levalbuterol (XOPENEX) 0.63 MG/3ML nebulizer solution One vial in nebulizer four times daily as needed 360 mL 5  . montelukast (SINGULAIR) 10 MG tablet TAKE 1 TABLET BY MOUTH AT BEDTIME 30 tablet 5  . polyethylene glycol powder (GLYCOLAX/MIRALAX) powder take 17GM (DISSOLVED IN WATER) by mouth once daily THREE TIMES A WEEK if needed as directed (Patient taking differently: Mix 17 g in liquid and drink 3 times weekly) 500 g 3  . potassium chloride SA (K-DUR,KLOR-CON) 20 MEQ tablet Take 1 tablet (20 mEq total) by mouth daily as directed. 30 tablet 11  . PRODIGY LANCETS 28G MISC 1 Units by Does not apply route 4 (four) times daily. 100 each 4  . rosuvastatin (CRESTOR) 10 MG tablet TAKE 1 TABLET(10 MG) BY MOUTH DAILY 90 tablet 1  . torsemide (DEMADEX) 20 MG tablet Take 20 mg by mouth daily.    Marland Kitchen VICTOZA 18 MG/3ML SOPN ADMINISTER 1.8 MG UNDER THE SKIN DAILY 9 mL 3  . warfarin (COUMADIN) 5 MG tablet Take 1 to 1 and 1/2 daily as directed by coumadin clinic 135 tablet 1   No current facility-administered medications for this visit.     Allergies:   Avelox [moxifloxacin hcl in nacl], Nsaids, Simvastatin, Moxifloxacin, Ace inhibitors, Jardiance [empagliflozin], and Latex   Social History:  The patient  reports that she has never smoked. She has never used smokeless tobacco. She reports that she does not drink alcohol or use drugs.   Family History:  The patient's  family history includes Diabetes in her brother  and father; Hypertension in her brother and father.   ROS:  Please see the history of present illness.   All other systems are personally reviewed and negative.    Exam:    Vital Signs:  BP 106/72   Pulse 84   Ht 5\' 6"  (1.676 m)   Wt 202 lb (91.6 kg)   LMP 07/26/2011   BMI 32.60 kg/m   Well appearing today, NAD, normal WOB   Labs/Other Tests and Data Reviewed:    Recent Labs: 09/09/2018: BUN 13; Creatinine, Ser 1.08; Hemoglobin 12.8; Magnesium 2.2; Platelets 211; Potassium 4.2; Sodium 140   Wt Readings from Last 3  Encounters:  10/19/18 202 lb (91.6 kg)  09/09/18 203 lb (92.1 kg)  07/18/18 196 lb 6.4 oz (89.1 kg)    In office device interrogation from 09/09/2018 reviewed   ASSESSMENT & PLAN:    1.  Chronic systolic dysfunction Doing well Echo reviewed from 09/19/2018- EF has normalized  2. VT Recently had ATP terminated VT (see 09/09/2018 note from [place]).  None since. Continue current medical therapy  3. Paroxysmal atrial fibrillation Controlled with tikosyn ekg 09/09/2018 reveals stable qt,  Labs also reviewed from 09/09/2018  4. HTN Stable No change required today  5. OSA Compliant with CPAP  Follow-up:  Follow-up with EP NP in 6 months   Patient Risk:  after full review of this patients clinical status, I feel that they are at moderate risk at this time.  Today, I have spent 15 minutes with the patient with telehealth technology discussing arrhythmia management .    Army Fossa, MD  10/19/2018 9:59 AM     [facility] [address] [place] [address] [phone] (office) [phone] (fax)

## 2018-10-24 ENCOUNTER — Ambulatory Visit (INDEPENDENT_AMBULATORY_CARE_PROVIDER_SITE_OTHER): Payer: Medicare HMO

## 2018-10-24 ENCOUNTER — Telehealth: Payer: Self-pay

## 2018-10-24 DIAGNOSIS — I5022 Chronic systolic (congestive) heart failure: Secondary | ICD-10-CM | POA: Diagnosis not present

## 2018-10-24 DIAGNOSIS — Z95810 Presence of automatic (implantable) cardiac defibrillator: Secondary | ICD-10-CM | POA: Diagnosis not present

## 2018-10-24 NOTE — Telephone Encounter (Signed)
Remote ICM transmission received.  Attempted call to patient regarding ICM remote transmission and left detailed message, per DPR, to return call.    

## 2018-10-24 NOTE — Progress Notes (Signed)
EPIC Encounter for ICM Monitoring  Patient Name: SHERRIE MARSAN is a 68 y.o. female Date: 10/24/2018 Primary Care Physican: Cleophas Dunker, DO Primary Cardiologist:Croitoru Electrophysiologist: Croitoru 09/26/2018 Weight: 201 lbs 10/05/2018 Weight: 199 lbs  Clinical Status (03-Oct-2018 to 24-Oct-2018)  AT/AF                   27 episodes  Time in AT/AF       0.4 hr/day (1.7%)  Longest AT/AF       99 minutes    Attempted call to patient and unable to reach.  Left message to return call. Transmission reviewed.   Optivol thoracic impedance suggestive of possible fluid accumulation since 10/16/2018.   Prescribed dosage:Torsemide 20 mg 1 tablet daily. Potassium 20 mEq 1 tablet by mouth as directed.  Labs: 09/09/2018 Creatinine 1.08, BUN 13, Potassium 4.2, Sodium 140, GFR 53-61 05/23/2018 Creatinine 1.17, BUN 17, Potassium 4.5, Sodium 141, GFR 48-56  A complete set of results can be found in Results Review.  Recommendations:Unable to reach.  Left message to return call.  Follow-up plan: ICM clinic phone appointment on7/20/2020 (manual send) to recheck fluid levels.  Copy of ICM check sent to Dr.Croitoru.  3 month ICM trend: 10/24/2018    AT/AF   1 Year ICM trend:       [name], RN 10/24/2018 4:41 PM

## 2018-10-25 NOTE — Progress Notes (Signed)
Spoke with patient.  She said she is doing fine and denies any fluid symptoms.  Transmission reviewed.  Advised report suggests possible fluid accumulation for the last couple of days.  She will take extra Torsemide x 1 day.   She is having thigh pain the leg that does not need surgery.  Advised to call PCP or her Ortho physician to be evaluated if it does not improve.  Will recheck fluid levels 10/31/2018.

## 2018-10-31 ENCOUNTER — Ambulatory Visit (INDEPENDENT_AMBULATORY_CARE_PROVIDER_SITE_OTHER): Payer: Medicare HMO

## 2018-10-31 DIAGNOSIS — I5022 Chronic systolic (congestive) heart failure: Secondary | ICD-10-CM

## 2018-10-31 DIAGNOSIS — Z95810 Presence of automatic (implantable) cardiac defibrillator: Secondary | ICD-10-CM

## 2018-11-01 ENCOUNTER — Other Ambulatory Visit: Payer: Self-pay

## 2018-11-01 ENCOUNTER — Telehealth: Payer: Self-pay

## 2018-11-01 ENCOUNTER — Telehealth: Payer: Self-pay | Admitting: Cardiovascular Disease

## 2018-11-01 ENCOUNTER — Ambulatory Visit (INDEPENDENT_AMBULATORY_CARE_PROVIDER_SITE_OTHER): Payer: Medicare HMO | Admitting: Family Medicine

## 2018-11-01 ENCOUNTER — Encounter: Payer: Self-pay | Admitting: Family Medicine

## 2018-11-01 VITALS — BP 126/82 | HR 80

## 2018-11-01 DIAGNOSIS — E1140 Type 2 diabetes mellitus with diabetic neuropathy, unspecified: Secondary | ICD-10-CM

## 2018-11-01 DIAGNOSIS — M7632 Iliotibial band syndrome, left leg: Secondary | ICD-10-CM

## 2018-11-01 DIAGNOSIS — E1165 Type 2 diabetes mellitus with hyperglycemia: Secondary | ICD-10-CM | POA: Diagnosis not present

## 2018-11-01 DIAGNOSIS — IMO0002 Reserved for concepts with insufficient information to code with codable children: Secondary | ICD-10-CM

## 2018-11-01 DIAGNOSIS — E669 Obesity, unspecified: Secondary | ICD-10-CM

## 2018-11-01 HISTORY — DX: Iliotibial band syndrome, left leg: M76.32

## 2018-11-01 LAB — POCT GLYCOSYLATED HEMOGLOBIN (HGB A1C): HbA1c, POC (controlled diabetic range): 8.2 % — AB (ref 0.0–7.0)

## 2018-11-01 MED ORDER — BD PEN NEEDLE SHORT U/F 31G X 8 MM MISC: 9 refills | Status: DC

## 2018-11-01 MED ORDER — BISOPROLOL FUMARATE 5 MG PO TABS: ORAL_TABLET | ORAL | 3 refills | Status: DC

## 2018-11-01 MED ORDER — BASAGLAR KWIKPEN 100 UNIT/ML ~~LOC~~ SOPN: 40.0000 [IU] | PEN_INJECTOR | Freq: Every day | SUBCUTANEOUS | 1 refills | Status: DC

## 2018-11-01 MED ORDER — VICTOZA 18 MG/3ML ~~LOC~~ SOPN: PEN_INJECTOR | SUBCUTANEOUS | 3 refills | Status: DC

## 2018-11-01 NOTE — Patient Instructions (Addendum)
Thank you for coming to see me today. It was a pleasure. Today we talked about:   Your diabetes: We will check your kidney function and I will call you about starting a new medication for your diabetes.  Your leg pain: get a foam roller and roll it up and down the side of your leg before and after you exercise.  Get voltaren gel from the pharmacy and use it on your leg for pain.  If you have any questions or concerns, please do not hesitate to call the office at [phone].  Best,   Luis Abed, DO   Iliotibial Band Syndrome Rehab Ask your health care provider which exercises are safe for you. Do exercises exactly as told by your health care provider and adjust them as directed. It is normal to feel mild stretching, pulling, tightness, or discomfort as you do these exercises. Stop right away if you feel sudden pain or your pain gets significantly worse. Do not begin these exercises until told by your health care provider. Stretching and range-of-motion exercises These exercises warm up your muscles and joints and improve the movement and flexibility of your hip and pelvis. Quadriceps stretch, prone  1. Lie on your abdomen on a firm surface, such as a bed or padded floor (prone position). 2. Bend your left / right knee and reach back to hold your ankle or pant leg. If you cannot reach your ankle or pant leg, loop a belt around your foot and grab the belt instead. 3. Gently pull your heel toward your buttocks. Your knee should not slide out to the side. You should feel a stretch in the front of your thigh and knee (quadriceps). 4. Hold this position for _____30_____ seconds. Repeat _____3_____ times. Complete this exercise ____2______ times a day. Iliotibial band stretch An iliotibial band is a strong band of muscle tissue that runs from the outer side of your hip to the outer side of your thigh and knee. 1. Lie on your side with your left / right leg in the top position. 2. Bend  both of your knees and grab your left / right ankle. Stretch out your bottom arm to help you balance. 3. Slowly bring your top knee back so your thigh goes behind your trunk. 4. Slowly lower your top leg toward the floor until you feel a gentle stretch on the outside of your left / right hip and thigh. If you do not feel a stretch and your knee will not fall farther, place the heel of your other foot on top of your knee and pull your knee down toward the floor with your foot. 5. Hold this position for ____30______ seconds. Repeat ____3______ times. Complete this exercise _____2_____ times a day. Strengthening exercises These exercises build strength and endurance in your hip and pelvis. Endurance is the ability to use your muscles for a long time, even after they get tired. Straight leg raises, side-lying This exercise strengthens the muscles that rotate the leg at the hip and move it away from your body (hip abductors). 1. Lie on your side with your left / right leg in the top position. Lie so your head, shoulder, hip, and knee line up. You may bend your bottom knee to help you balance. 2. Roll your hips slightly forward so your hips are stacked directly over each other and your left / right knee is facing forward. 3. Tense the muscles in your outer thigh and lift your top leg 4-6 inches (  10-15 cm). 4. Hold this position for _____10_____ seconds. 5. Slowly return to the starting position. Let your muscles relax completely before doing another repetition. Repeat ____3______ times. Complete this exercise ___2_______ times a day. Leg raises, prone This exercise strengthens the muscles that move the hips (hip extensors). 1. Lie on your abdomen on your bed or a firm surface. You can put a pillow under your hips if that is more comfortable for your lower back. 2. Bend your left / right knee so your foot is straight up in the air. 3. Squeeze your buttocks muscles and lift your left / right thigh off the  bed. Do not let your back arch. 4. Tense your thigh muscle as hard as you can without increasing any knee pain. 5. Hold this position for ____10______ seconds. 6. Slowly lower your leg to the starting position and allow it to relax completely. Repeat ____3______ times. Complete this exercise ____2______ times a day. Hip hike 1. Stand sideways on a bottom step. Stand on your left / right leg with your other foot unsupported next to the step. You can hold on to the railing or wall for balance if needed. 2. Keep your knees straight and your torso square. Then lift your left / right hip up toward the ceiling. 3. Slowly let your left / right hip lower toward the floor, past the starting position. Your foot should get closer to the floor. Do not lean or bend your knees. Repeat ____3______ times. Complete this exercise ____2______ times a day. This information is not intended to replace advice given to you by your health care provider. Make sure you discuss any questions you have with your health care provider. Document Released: 03/30/2005 Document Revised: 07/21/2018 Document Reviewed: 01/19/2018 Elsevier Patient Education  2020 [employer].

## 2018-11-01 NOTE — Telephone Encounter (Signed)
New Message      *STAT* If patient is at the pharmacy, call can be transferred to refill team.   1. Which medications need to be refilled? (please list name of each medication and dose if known) Bisoprolol 5mg  2 tablets daily  2. Which pharmacy/location (including street and city if local pharmacy) is medication to be sent to? [facility] on [address]  3. Do they need a 30 day or 90 day supply? 90 day supply

## 2018-11-01 NOTE — Telephone Encounter (Signed)
Bisoprolol was indeed increased to 10 mg a.m. +5 mg p.m. by Chanetta Marshall, NP in May 2020. Please refill at that dose, #135, RF3

## 2018-11-01 NOTE — Assessment & Plan Note (Signed)
A1c up to 8.2 from 8.0.  Working to decrease to 7.5 so that patient can have right knee replacement.  Given her hypoglycemia during the evenings, will not increase Basaglar.  Continue Victoza, at max dose for DM.  Will check BMP and consider starting canagliflozin.  Pt has HFpEF, therefore farxiga would not add benefit.  Will need repeat BMP 2 weeks after starting this.  Plan to call when results in and will adjust regimen at that time.

## 2018-11-01 NOTE — Telephone Encounter (Signed)
Spoke with pt to clarify Bisoprolol dose. Pt report she takes 2 tablets (10 mg) in the morning and 1 tablet at night. Pt state she was instructed by Dr. Loletha Grayer to increase dose. Per chart review, it shows pt should be taking 2 tablets PO daily. Will route to MD for clarifications.

## 2018-11-01 NOTE — Assessment & Plan Note (Signed)
Exam consistent with IT band syndrome and has worsened with increased exercise.  Distal pain, therefore doubt bursitis and would hold off on injection.  Patient advised to obtain a foam roller and instructed on stretching technique to perform before and after exercising.  Also given exercises and stretching to do for IT band.  She was offered PT referral, but declined at this time.  Also instructed to obtain voltaren gel OTC at the pharmacy.

## 2018-11-01 NOTE — Progress Notes (Signed)
EPIC Encounter for ICM Monitoring  Patient Name: ANWEN CANNEDY is a 68 y.o. female Date: 11/01/2018 Primary Care Physican: Cleophas Dunker, DO Primary Cardiologist:Croitoru Electrophysiologist: Croitoru 09/26/2018 Weight: 201 lbs 10/05/2018 Weight: 199 lbs   Clinical Status (24-Oct-2018 to 01-Nov-2018)  AT/AF                     4 episodes  Time in AT/AF       0.2 hr/day (0.8%)  Longest AT/AF       49 minutes    Transmission reviewed and spoke with patient. She said her A1C is still 8% which is still too high for knee replacement surgery.  A1C needs to be 7.5%.         She has new PCP that is going to do some labs and makes some diabetes medication changes.      Optivol thoracic impedance returned to normal after taking extra dose of Torsemide x 1 day.  Prescribed dosage:Torsemide 20 mg 1 tablet daily. Potassium 20 mEq 1 tablet by mouth as directed.  Labs: 09/09/2018 Creatinine 1.08, BUN 13, Potassium 4.2, Sodium 140, GFR 53-61 05/23/2018 Creatinine 1.17, BUN 17, Potassium 4.5, Sodium 141, GFR 48-56  A complete set of results can be found in Results Review.  Recommendations:No changes and encouraged to call if experiencing any fluid symptoms.  Follow-up plan: ICM clinic phone appointment on8/17/2020.  Copy of ICM check sent to Dr.Croitoru.  3 month ICM trend: 11/01/2018    1 Year ICM trend:       [name], RN 11/01/2018 3:57 PM

## 2018-11-01 NOTE — Telephone Encounter (Signed)
Left message for patient to remind of missed remote transmission.  

## 2018-11-01 NOTE — Telephone Encounter (Signed)
Pt updated and new Rx sent to pharmacy.

## 2018-11-01 NOTE — Progress Notes (Signed)
Subjective: Chief Complaint  Patient presents with  . Diabetes     HPI: [name] is a 68 y.o. presenting to clinic today to discuss the following:  1 Diabetes Basaglar 40U QAM, 5-8U novolog, uses only when over 200. Victoza 1.8mg  QD. Reports lows once every month or even less frequent.  Lowest is 87.  Usually happens if she doesn't eat enough during the day and occurs in the evenings.  Checks sugar twice a day.  Avg 120-160.   Has been compliant with her medications.   Reports that she thinks that she is making healthier choices with her diet and doing well.  Eating bread 1-2x a week.  Decreasing her carb intake.   Has been told that A1c needs to be <7.5 for her to have her right knee replaced.  2 Obesity  Has been using stationary bike daily for 2 months, has worked up to 30 minutes a day.  Reports that her knee is okay with that, uses a knee brace.    3 Left Leg Pain Patient notes that for the last month, she has been having some pain in the left thigh, especially on the lateral side, about 2 inches up from knee.  Notes that pain is worse at night.  Does not think it worses with activity.  Occasionally has pain in left anterior shin as well.  Does not ache, but notes that it is tender to palpation "just hurts."  She notes that she is getting to the point when she has difficulty walking.  Health Maintenance: A1c today, has eye appointment on 8/3 scheduled, goes to podiatrist in August as well      ROS noted in HPI.   Past Medical, Surgical, Social, and Family History Reviewed & Updated per EMR.   Pertinent Historical Findings include:   Social History   Tobacco Use  Smoking Status Never Smoker  Smokeless Tobacco Never Used      Objective: BP 126/82   Pulse 80   LMP 07/26/2011   SpO2 99%  Vitals and nursing notes reviewed  Physical Exam:  General: 68 y.o. female in NAD Cardio: RRR no m/r/g Lungs: CTAB, no wheezing, no rhonchi, no crackles, no IWOB on RA  Abdomen: Soft, non-tender to palpation, non-distended, positive bowel sounds Skin: warm and dry Extremities: No edema  Left Lower extremity: no visual abnormalities, TTP lateral upper left leg 2 cm proximal to knee, TTP along lateral aspect of left upper leg, full hip and knee ROM, no TTP left femoral head, sensation intact throughout, 2+ dorsalis pedis pulses   Results for orders placed or performed in visit on 11/01/18 (from the past 72 hour(s))  HgB A1c     Status: Abnormal   Collection Time: 11/01/18 11:00 AM  Result Value Ref Range   Hemoglobin A1C     HbA1c POC (<> result, manual entry)     HbA1c, POC (prediabetic range)     HbA1c, POC (controlled diabetic range) 8.2 (A) 0.0 - 7.0 %   *Note: Due to a large number of results and/or encounters for the requested time period, some results have not been displayed. A complete set of results can be found in Results Review.    Assessment/Plan:  Type 2 diabetes, uncontrolled, with neuropathy (HCC) A1c up to 8.2 from 8.0.  Working to decrease to 7.5 so that patient can have right knee replacement.  Given her hypoglycemia during the evenings, will not increase Basaglar.  Continue Victoza, at max  dose for DM.  Will check BMP and consider starting canagliflozin.  Pt has HFpEF, therefore farxiga would not add benefit.  Will need repeat BMP 2 weeks after starting this.  Plan to call when results in and will adjust regimen at that time.  Obesity (BMI 30-39.9) Patient is making improvements in her diet and exercising regularly.  She was congratulated on this.  Iliotibial band syndrome, left Exam consistent with IT band syndrome and has worsened with increased exercise.  Distal pain, therefore doubt bursitis and would hold off on injection.  Patient advised to obtain a foam roller and instructed on stretching technique to perform before and after exercising.  Also given exercises and stretching to do for IT band.  She was offered PT referral, but  declined at this time.  Also instructed to obtain voltaren gel OTC at the pharmacy.     PATIENT EDUCATION PROVIDED: See AVS    Diagnosis and plan along with any newly prescribed medication(s) were discussed in detail with this patient today. The patient verbalized understanding and agreed with the plan. Patient advised if symptoms worsen return to clinic or ER.   Health Maintainance: has upcoming apts for foot exam and eye exam   Orders Placed This Encounter  Procedures  . Basic Metabolic Panel  . HgB A1c    No orders of the defined types were placed in this encounter.    [name], DO 11/01/2018, 12:11 PM PGY-2 [place]

## 2018-11-01 NOTE — Assessment & Plan Note (Signed)
Patient is making improvements in her diet and exercising regularly.  She was congratulated on this.

## 2018-11-02 ENCOUNTER — Other Ambulatory Visit: Payer: Self-pay | Admitting: Family Medicine

## 2018-11-02 DIAGNOSIS — E1140 Type 2 diabetes mellitus with diabetic neuropathy, unspecified: Secondary | ICD-10-CM

## 2018-11-02 DIAGNOSIS — IMO0002 Reserved for concepts with insufficient information to code with codable children: Secondary | ICD-10-CM

## 2018-11-02 LAB — BASIC METABOLIC PANEL
BUN/Creatinine Ratio: 14 (ref 12–28)
BUN: 19 mg/dL (ref 8–27)
CO2: 24 mmol/L (ref 20–29)
Calcium: 8.6 mg/dL — ABNORMAL LOW (ref 8.7–10.3)
Chloride: 103 mmol/L (ref 96–106)
Creatinine, Ser: 1.34 mg/dL — ABNORMAL HIGH (ref 0.57–1.00)
GFR calc Af Amer: 47 mL/min/{1.73_m2} — ABNORMAL LOW (ref >59)
GFR calc non Af Amer: 41 mL/min/{1.73_m2} — ABNORMAL LOW (ref >59)
Glucose: 113 mg/dL — ABNORMAL HIGH (ref 65–99)
Potassium: 4.2 mmol/L (ref 3.5–5.2)
Sodium: 142 mmol/L (ref 134–144)

## 2018-11-02 MED ORDER — CANAGLIFLOZIN 100 MG PO TABS: 100.0000 mg | ORAL_TABLET | Freq: Every day | ORAL | 3 refills | Status: DC

## 2018-11-02 NOTE — Progress Notes (Signed)
Serum creatinine: 1.34 mg/dL (H) 11/01/18 1223 Estimated creatinine clearance: 46.4 mL/min (A)   Given CrCl and need for better A1c, will add canagliflozin 100mg  QD. Will need repeat BMP in 2 weeks.  Called patient and informed of this. Also informed of increased risk of foot ulcers and amputations.  She voiced understanding.  Appointment made for follow up on 8/5 at 9:20am.  [name], D.O.  PGY-2 Family Medicine  11/02/2018 3:21 PM

## 2018-11-03 ENCOUNTER — Telehealth: Payer: Self-pay | Admitting: *Deleted

## 2018-11-03 NOTE — Telephone Encounter (Signed)
Received fax from pharmacy, PA needed on [place].  Clinical questions submitted via Cover My Meds.  Med Approved   Cover My Meds info: Key: [id number]  Christen Bame, CMA

## 2018-11-04 ENCOUNTER — Telehealth: Payer: Self-pay

## 2018-11-04 NOTE — Telephone Encounter (Signed)
° ° °  COVID-19 Pre-Screening Questions: ° °• In the past 7 to 10 days have you had a cough,  shortness of breath, headache, congestion, fever (100 or greater) body aches, chills, sore throat, or sudden loss of taste or sense of smell? no °• Have you been around anyone with known Covid 19. No  °• Have you been around anyone who is awaiting Covid 19 test results in the past 7 to 10 days? No  °• Have you been around anyone who has been exposed to Covid 19, or has mentioned symptoms of Covid 19 within the past 7 to 10 days? No  ° °If you have any concerns/questions about symptoms patients report during screening (either on the phone or at threshold). Contact the provider seeing the patient or DOD for further guidance.  If neither are available contact a member of the leadership team. ° ° °

## 2018-11-08 ENCOUNTER — Ambulatory Visit (INDEPENDENT_AMBULATORY_CARE_PROVIDER_SITE_OTHER): Payer: Medicare HMO | Admitting: Pharmacist Clinician (PhC)/ Clinical Pharmacy Specialist

## 2018-11-08 ENCOUNTER — Other Ambulatory Visit: Payer: Self-pay

## 2018-11-08 DIAGNOSIS — Z7901 Long term (current) use of anticoagulants: Secondary | ICD-10-CM | POA: Diagnosis not present

## 2018-11-08 DIAGNOSIS — I480 Paroxysmal atrial fibrillation: Secondary | ICD-10-CM

## 2018-11-08 DIAGNOSIS — I4891 Unspecified atrial fibrillation: Secondary | ICD-10-CM

## 2018-11-08 LAB — POCT INR: INR: 2.5 (ref 2.0–3.0)

## 2018-11-14 DIAGNOSIS — H5200 Hypermetropia, unspecified eye: Secondary | ICD-10-CM | POA: Diagnosis not present

## 2018-11-14 DIAGNOSIS — Z135 Encounter for screening for eye and ear disorders: Secondary | ICD-10-CM | POA: Diagnosis not present

## 2018-11-16 ENCOUNTER — Telehealth: Payer: Self-pay

## 2018-11-16 ENCOUNTER — Other Ambulatory Visit: Payer: Self-pay

## 2018-11-16 ENCOUNTER — Ambulatory Visit (INDEPENDENT_AMBULATORY_CARE_PROVIDER_SITE_OTHER): Payer: Medicare HMO | Admitting: Family Medicine

## 2018-11-16 VITALS — BP 125/80 | HR 89

## 2018-11-16 DIAGNOSIS — IMO0002 Reserved for concepts with insufficient information to code with codable children: Secondary | ICD-10-CM

## 2018-11-16 DIAGNOSIS — E1140 Type 2 diabetes mellitus with diabetic neuropathy, unspecified: Secondary | ICD-10-CM | POA: Diagnosis not present

## 2018-11-16 DIAGNOSIS — E1165 Type 2 diabetes mellitus with hyperglycemia: Secondary | ICD-10-CM | POA: Diagnosis not present

## 2018-11-16 NOTE — Patient Instructions (Signed)
Thank you for coming to see me today. It was a pleasure. Today we talked about:   Your diabetes: We will check your blood again today, if it is abnormal or we need to make changes I will give you a call, otherwise I will send you a letter in the mail.  Continue to take all of your medications the same, make sure that you are keeping track of your blood sugars at least twice a day and writing them down.  Use this sheet that I have given you to do this.  Please follow-up with me in 1-2 months or sooner as needed.  If you have any questions or concerns, please do not hesitate to call the office at [phone].  Best,   [name], DO

## 2018-11-16 NOTE — Assessment & Plan Note (Signed)
Patient tolerating addition of Invokana to her diabetes regimen well.  She does not have any adverse effects.  Will check BMP today to ensure that renal function has remained stable.  Patient given blood sugar log printout to keep track of her sugars.  She was advised to follow-up in 1 to 2 months, and bring her blood sugar log at that time to make appropriate changes to her insulin regimen.

## 2018-11-16 NOTE — Telephone Encounter (Signed)
Patient with diagnosis of afbi on warfarin for anticoagulation.    Procedure: Right Total Knee Replacement  Date of procedure: TBD  CHADS2-VASc score of  5 (CHF, HTN, AGE, DM2, female)  Per office protocol, patient can hold warfarin for 5 days prior to procedure.    Patient will NOT need bridging with Lovenox (enoxaparin) around procedure.

## 2018-11-16 NOTE — Telephone Encounter (Signed)
   [facility] Pre-operative Risk Assessment    Request for surgical clearance:  1. What type of surgery is being performed? Right Total Knee Replacement   2. When is this surgery scheduled? TBD  3. What type of clearance is required (medical clearance vs. Pharmacy clearance to hold med vs. Both)? Both  4. Are there any medications that need to be held prior to surgery and how long? Coumadin  5. Practice name and name of physician performing surgery? [facility]   6. What is your office phone number [phone]   7.   What is your office fax number [phone]  8.   Anesthesia type  Not listed   Kathyrn Lass 11/16/2018, 1:45 PM  _________________________________________________________________   (provider comments below)

## 2018-11-16 NOTE — Progress Notes (Signed)
     Subjective: Chief Complaint  Patient presents with  . Diabetes    Follow up     HPI: [name] is a 68 y.o. presenting to clinic today to discuss the following:  1 Diabetes Patient presents for a 2-week follow-up of diabetes after recent addition of Invokana to her regimen.  She notes that she has been tolerating this well, has not noticed any new wounds on her feet, has no episodes of hypoglycemia.  She states she continues to check her blood sugars once in the morning and once at night.  She states that they vary quite a lot, this morning she thinks her sugar was in the low 200s, but states she ate graham crackers and peanut butter before going to bed last night.  Notes that her nighttime sugars range from 130-200.  She does not have a log of her blood sugars.      ROS noted in HPI.   Past Medical, Surgical, Social, and Family History Reviewed & Updated per EMR.   Pertinent Historical Findings include:   Social History   Tobacco Use  Smoking Status Never Smoker  Smokeless Tobacco Never Used      Objective: BP 125/80   Pulse 89   LMP 07/26/2011   SpO2 98%  Vitals and nursing notes reviewed  Physical Exam:  General: 68 y.o. female in NAD Cardio: RRR no m/r/g Lungs: CTAB, no wheezing, no rhonchi, no crackles, no IWOB on RA Skin: warm and dry Extremities: No edema, no wounds noted on feet   No results found. However, due to the size of the patient record, not all encounters were searched. Please check Results Review for a complete set of results.  Assessment/Plan:  Type 2 diabetes, uncontrolled, with neuropathy ([place]) Patient tolerating addition of Invokana to her diabetes regimen well.  She does not have any adverse effects.  Will check BMP today to ensure that renal function has remained stable.  Patient given blood sugar log printout to keep track of her sugars.  She was advised to follow-up in 1 to 2 months, and bring her blood sugar log at that time to  make appropriate changes to her insulin regimen.     PATIENT EDUCATION PROVIDED: See AVS    Diagnosis and plan along with any newly prescribed medication(s) were discussed in detail with this patient today. The patient verbalized understanding and agreed with the plan. Patient advised if symptoms worsen return to clinic or ER.      Orders Placed This Encounter  Procedures  . Basic Metabolic Panel    No orders of the defined types were placed in this encounter.    [name], DO 11/16/2018, 1:47 PM PGY-2 [name]

## 2018-11-17 ENCOUNTER — Encounter (HOSPITAL_BASED_OUTPATIENT_CLINIC_OR_DEPARTMENT_OTHER): Payer: Self-pay | Admitting: Family Medicine

## 2018-11-17 ENCOUNTER — Encounter: Payer: Self-pay | Admitting: Family Medicine

## 2018-11-17 ENCOUNTER — Other Ambulatory Visit: Payer: Self-pay | Admitting: *Deleted

## 2018-11-17 LAB — BASIC METABOLIC PANEL
BUN/Creatinine Ratio: 16 (ref 12–28)
BUN: 19 mg/dL (ref 8–27)
CO2: 25 mmol/L (ref 20–29)
Calcium: 8.8 mg/dL (ref 8.7–10.3)
Chloride: 101 mmol/L (ref 96–106)
Creatinine, Ser: 1.18 mg/dL — ABNORMAL HIGH (ref 0.57–1.00)
GFR calc Af Amer: 55 mL/min/{1.73_m2} — ABNORMAL LOW (ref >59)
GFR calc non Af Amer: 48 mL/min/{1.73_m2} — ABNORMAL LOW (ref >59)
Glucose: 189 mg/dL — ABNORMAL HIGH (ref 65–99)
Potassium: 4.2 mmol/L (ref 3.5–5.2)
Sodium: 141 mmol/L (ref 134–144)

## 2018-11-17 MED ORDER — BUPROPION HCL ER (XL) 150 MG PO TB24: 150.0000 mg | ORAL_TABLET | Freq: Every day | ORAL | 3 refills | Status: DC

## 2018-11-17 NOTE — Telephone Encounter (Signed)
   Primary Cardiologist: Sanda Klein, MD  Chart reviewed as part of pre-operative protocol coverage. Patient was contacted 11/17/2018 in reference to pre-operative risk assessment for pending surgery as outlined below.  SHAYNE DEERMAN was last seen on 10/19/2018 by Dr. Rayann Heman via a telemedicine visit.  Since that day, [name] has done well from a cardiac standpoint. She has a history of asthma with chronic DOE which is unchanged in recent months. She can easily complete 4 METs without anginal complaints.  Therefore, based on ACC/AHA guidelines, the patient would be at acceptable risk for the planned procedure without further cardiovascular testing.   Per pharmacy recommendations, patient can hold coumadin 5 days prior to her upcoming knee replacement. She should restart coumadin when cleared to do so by her orthopedist.   I will route this recommendation to the requesting party via [place] fax function and remove from pre-op pool.  Please call with questions.  Abigail Butts, PA-C 11/17/2018, 9:15 AM

## 2018-11-18 MED ORDER — BUPROPION HCL ER (XL) 150 MG PO TB24: 150.0000 mg | ORAL_TABLET | Freq: Every day | ORAL | 3 refills | Status: DC

## 2018-11-18 NOTE — Telephone Encounter (Signed)
Med sent to wrong pharmacy, will resend.  Pt will cancel order @ [facility]. Christen Bame, CMA

## 2018-11-18 NOTE — Addendum Note (Signed)
Addended by: Christen Bame D on: 11/18/2018 09:27 AM   Modules accepted: Orders

## 2018-11-21 DIAGNOSIS — J449 Chronic obstructive pulmonary disease, unspecified: Secondary | ICD-10-CM | POA: Diagnosis not present

## 2018-11-21 DIAGNOSIS — J45909 Unspecified asthma, uncomplicated: Secondary | ICD-10-CM | POA: Diagnosis not present

## 2018-11-23 ENCOUNTER — Ambulatory Visit (INDEPENDENT_AMBULATORY_CARE_PROVIDER_SITE_OTHER): Payer: Medicare HMO | Admitting: *Deleted

## 2018-11-23 DIAGNOSIS — I472 Ventricular tachycardia, unspecified: Secondary | ICD-10-CM

## 2018-11-23 LAB — CUP PACEART REMOTE DEVICE CHECK
Battery Remaining Longevity: 100 mo
Battery Voltage: 3 V
Brady Statistic AP VP Percent: 0.59 %
Brady Statistic AP VS Percent: 95.44 %
Brady Statistic AS VP Percent: 0.03 %
Brady Statistic AS VS Percent: 3.95 %
Brady Statistic RA Percent Paced: 93.91 %
Brady Statistic RV Percent Paced: 0.66 %
Date Time Interrogation Session: 20200812062604
HighPow Impedance: 73 Ohm
Implantable Lead Implant Date: 20120120
Implantable Lead Implant Date: 20120120
Implantable Lead Location: 753859
Implantable Lead Location: 753860
Implantable Lead Model: 5076
Implantable Lead Model: 6935
Implantable Pulse Generator Implant Date: 20190618
Lead Channel Impedance Value: 342 Ohm
Lead Channel Impedance Value: 437 Ohm
Lead Channel Impedance Value: 456 Ohm
Lead Channel Pacing Threshold Amplitude: 0.5 V
Lead Channel Pacing Threshold Amplitude: 0.625 V
Lead Channel Pacing Threshold Pulse Width: 0.4 ms
Lead Channel Pacing Threshold Pulse Width: 0.4 ms
Lead Channel Sensing Intrinsic Amplitude: 12.125 mV
Lead Channel Sensing Intrinsic Amplitude: 12.125 mV
Lead Channel Sensing Intrinsic Amplitude: 2.5 mV
Lead Channel Sensing Intrinsic Amplitude: 2.5 mV
Lead Channel Setting Pacing Amplitude: 2 V
Lead Channel Setting Pacing Amplitude: 2.5 V
Lead Channel Setting Pacing Pulse Width: 0.4 ms
Lead Channel Setting Sensing Sensitivity: 0.3 mV

## 2018-11-28 ENCOUNTER — Ambulatory Visit (INDEPENDENT_AMBULATORY_CARE_PROVIDER_SITE_OTHER): Payer: Medicare HMO

## 2018-11-28 ENCOUNTER — Other Ambulatory Visit: Payer: Self-pay | Admitting: Pulmonary Disease

## 2018-11-28 DIAGNOSIS — Z95810 Presence of automatic (implantable) cardiac defibrillator: Secondary | ICD-10-CM

## 2018-11-28 DIAGNOSIS — I5022 Chronic systolic (congestive) heart failure: Secondary | ICD-10-CM

## 2018-11-30 NOTE — Progress Notes (Signed)
EPIC Encounter for ICM Monitoring  Patient Name: [name] is a 68 y.o. female Date: 11/30/2018 Primary Care Physican: Cleophas Dunker, DO Primary Cardiologist:Croitoru Electrophysiologist:Croitoru 11/30/2018 Weight: 198 lbs  Clinical Status (23-Nov-2018 to 28-Nov-2018)  AT/AF               25 episodes  Time in AT/AF  0.5 hr/day (2.2%)  Longest AT/AF  52 minutes    Transmission reviewed and spoke with patient. She said she is having pain in her knee and is opposite side of the one that needs total replacement.    Optivol thoracic impedancenormal.  Prescribed dosage:Torsemide 20 mg 1 tablet daily. Potassium 20 mEq 1 tabletby mouth as directed.  Labs: 09/09/2018 Creatinine1.08, BUN13, Potassium4.2, Sodium140, [id number] 05/23/2018 Creatinine1.17, BUN17, Potassium4.5, Sodium141, [id number] A complete set of results can be found in Results Review.  Recommendations:No changes and encouraged to call if experiencing any fluid symptoms.  Follow-up plan: ICM clinic phone appointment on10/08/2018.  OV with Dr Sallyanne Kuster on 02/10/2019.  Copy of ICM check sent to Dr.Croitoru.  3 month ICM trend: 11/28/2018    1 Year ICM trend:       [name], RN 11/30/2018 4:55 PM

## 2018-12-01 DIAGNOSIS — Z96652 Presence of left artificial knee joint: Secondary | ICD-10-CM | POA: Diagnosis not present

## 2018-12-01 DIAGNOSIS — M545 Low back pain: Secondary | ICD-10-CM | POA: Diagnosis not present

## 2018-12-01 DIAGNOSIS — M1711 Unilateral primary osteoarthritis, right knee: Secondary | ICD-10-CM | POA: Diagnosis not present

## 2018-12-02 ENCOUNTER — Encounter: Payer: Self-pay | Admitting: Cardiology

## 2018-12-02 NOTE — Progress Notes (Signed)
Received voice mail message stating the orthopedic dx her with pinched nerve and she was calling to let me know.  Attempted to call back and no answer.

## 2018-12-02 NOTE — Progress Notes (Signed)
Remote ICD transmission.   

## 2018-12-06 ENCOUNTER — Ambulatory Visit: Payer: Medicare HMO | Admitting: Sports Medicine

## 2018-12-06 ENCOUNTER — Other Ambulatory Visit: Payer: Self-pay

## 2018-12-06 ENCOUNTER — Encounter: Payer: Self-pay | Admitting: Sports Medicine

## 2018-12-06 VITALS — Temp 98.4°F

## 2018-12-06 DIAGNOSIS — B351 Tinea unguium: Secondary | ICD-10-CM | POA: Diagnosis not present

## 2018-12-06 DIAGNOSIS — Z794 Long term (current) use of insulin: Secondary | ICD-10-CM

## 2018-12-06 DIAGNOSIS — I739 Peripheral vascular disease, unspecified: Secondary | ICD-10-CM

## 2018-12-06 DIAGNOSIS — M79676 Pain in unspecified toe(s): Secondary | ICD-10-CM | POA: Diagnosis not present

## 2018-12-06 DIAGNOSIS — E0842 Diabetes mellitus due to underlying condition with diabetic polyneuropathy: Secondary | ICD-10-CM | POA: Diagnosis not present

## 2018-12-06 DIAGNOSIS — E1140 Type 2 diabetes mellitus with diabetic neuropathy, unspecified: Secondary | ICD-10-CM

## 2018-12-06 DIAGNOSIS — L84 Corns and callosities: Secondary | ICD-10-CM | POA: Diagnosis not present

## 2018-12-06 NOTE — Progress Notes (Signed)
Subjective: [name] is a 68 y.o. female patient with history of diabetes who presents to office today complaining of long,mildly painful nails  while ambulating in shoes; unable to trim. Patient states that the glucose reading this morning was 147 still on Victoza. No new issues besides issues with a pinched nerve.   Last, A1c ~8 like before.   Patient Active Problem List   Diagnosis Date Noted  . Iliotibial band syndrome, left 11/01/2018  . GERD (gastroesophageal reflux disease) 03/23/2018  . Chronic diastolic heart failure (HCC) 01/07/2018  . Mild obesity 01/07/2018  . Arthritis of carpometacarpal (CMC) joint of thumb 03/26/2016  . Primary localized osteoarthritis of right knee   . Visit for monitoring Tikosyn therapy 08/21/2015  . Long term current use of anticoagulant therapy 08/21/2015  . Ventricular tachycardia (HCC) 05/22/2015  . Obesity (BMI 30-39.9) 01/04/2014  . ICD (St. Jude Protecta dual-chamber),secondary prevention (VF arrest) January 2012 11/19/2012  . CKD (chronic kidney disease) stage 2, GFR 60-89 ml/min 02/22/2012  . Asthma, moderate persistent 12/10/2011  . Diabetic neuropathy (HCC) 09/18/2011  . Osteoarthritis of right knee 08/28/2008  . Type 2 diabetes, uncontrolled, with neuropathy (HCC) 06/10/2006  . HYPERCHOLESTEROLEMIA 06/10/2006  . Essential hypertension 06/10/2006  . Atrial fibrillation (HCC) 06/10/2006  . OSA on CPAP 06/10/2006   Current Outpatient Medications on File Prior to Visit  Medication Sig Dispense Refill  . bisoprolol (ZEBETA) 5 MG tablet Take 2 tablets (10 mg total) by mouth every morning AND 1 tablet (5 mg total) at bedtime. 135 tablet 3  . budesonide-formoterol (SYMBICORT) 160-4.5 MCG/ACT inhaler Inhale 2 puffs into the lungs 2 (two) times daily as needed. 1 Inhaler 3  . buPROPion (WELLBUTRIN XL) 150 MG 24 hr tablet Take 1 tablet (150 mg total) by mouth daily. 90 tablet 3  . canagliflozin (INVOKANA) 100 MG TABS tablet Take 1 tablet (100  mg total) by mouth daily. 90 tablet 3  . dofetilide (TIKOSYN) 250 MCG capsule Take 1 capsule (250 mcg total) by mouth 2 (two) times daily. 180 capsule 2  . famotidine (PEPCID) 20 MG tablet Take 20 mg by mouth daily.    . fluticasone (FLONASE) 50 MCG/ACT nasal spray instill 1 spray into each nostril twice a day 16 g 5  . glucose blood test strip Use as instructed 100 each 12  . insulin aspart (NOVOLOG FLEXPEN) 100 UNIT/ML FlexPen Inject 5-8 Units into the skin See admin instructions. Inject 5-8 units SQ before supper 15 mL 3  . Insulin Glargine (BASAGLAR KWIKPEN) 100 UNIT/ML SOPN Inject 0.4 mLs (40 Units total) into the skin daily. 15 mL 1  . Insulin Pen Needle (B-D ULTRAFINE III SHORT PEN) 31G X 8 MM MISC Use as directed up to 4 times daily to check blood sugar 100 each 9  . levalbuterol (XOPENEX HFA) 45 MCG/ACT inhaler INHALE 1 TO 2 PUFFS BY MOUTH INTO THE LUNGS EVERY 4 HOURS IF NEEDED FOR WHEEZING 15 Inhaler 5  . levalbuterol (XOPENEX) 0.63 MG/3ML nebulizer solution One vial in nebulizer four times daily as needed 360 mL 5  . liraglutide (VICTOZA) 18 MG/3ML SOPN ADMINISTER 1.8 MG UNDER THE SKIN DAILY 9 mL 3  . montelukast (SINGULAIR) 10 MG tablet TAKE 1 TABLET BY MOUTH AT BEDTIME 30 tablet 1  . polyethylene glycol powder (GLYCOLAX/MIRALAX) powder take 17GM (DISSOLVED IN WATER) by mouth once daily THREE TIMES A WEEK if needed as directed (Patient taking differently: Mix 17 g in liquid and drink 3 times weekly) 500 g  3  . potassium chloride SA (K-DUR,KLOR-CON) 20 MEQ tablet Take 1 tablet (20 mEq total) by mouth daily as directed. 30 tablet 11  . PRODIGY LANCETS 28G MISC 1 Units by Does not apply route 4 (four) times daily. 100 each 4  . rosuvastatin (CRESTOR) 10 MG tablet TAKE 1 TABLET(10 MG) BY MOUTH DAILY 90 tablet 1  . torsemide (DEMADEX) 20 MG tablet Take 20 mg by mouth daily.    Marland Kitchen. warfarin (COUMADIN) 5 MG tablet Take 1 to 1 and 1/2 daily as directed by coumadin clinic 135 tablet 1   No  current facility-administered medications on file prior to visit.    Allergies  Allergen Reactions  . Avelox [Moxifloxacin Hcl In Nacl] Other (See Comments)    Cardiac arrest per pt  . Nsaids Other (See Comments)    Cardiac arrest per pt Cardiac arrest per pt  . Simvastatin Other (See Comments)    myalgias myalgias myalgias  . Moxifloxacin Other (See Comments)  . Ace Inhibitors Other (See Comments) and Cough    Dry cough  Dry cough  Dry cough   . Jardiance [Empagliflozin] Other (See Comments)    Felt "crazy", fatigue, sweating, denies hypoglycemia while taking  . Latex Rash    Recent Results (from the past 2160 hour(s))  CUP PACEART INCLINIC DEVICE CHECK     Status: None   Collection Time: 09/09/18  9:21 AM  Result Value Ref Range   Date Time Interrogation Session [id number]    Pulse Generator Manufacturer MERM    Pulse Gen Model [id number] Evera MRI XT DR    Pulse Gen Serial Number [id number]    [facility]    Implantable Pulse Generator Type Implantable Cardiac Defibulator    Implantable Pulse Generator Implant Date [id number]    Implantable Lead [facility]urer [facility]    Implantable Lead Model 5076 CapSureFix Novus    Implantable Lead Serial Number [id number]    Implantable Lead Implant Date [id number]    Implantable Lead Location [id number]    Implantable Lead [facility]urer [facility]    Implantable Lead Model 6935 Sprint Quattro Secure S    Implantable Lead Serial Number [id number]    Implantable Lead Implant Date [id number]    Implantable Lead Location [id number]    Lead Channel Setting Sensing Sensitivity 0.3 mV   Lead Channel Setting Pacing Amplitude 2 V   Lead Channel Setting Pacing Pulse Width 0.4 ms   Lead Channel Setting Pacing Amplitude 2.5 V   Lead Channel Impedance Value 456 ohm   Lead Channel Sensing Intrinsic Amplitude 1.25 mV   Lead Channel Sensing Intrinsic Amplitude 1 mV   Lead Channel Pacing Threshold Amplitude 0.375 V   Lead Channel Pacing Threshold  Pulse Width 0.4 ms   Lead Channel Impedance Value 456 ohm   Lead Channel Impedance Value 380 ohm   Lead Channel Sensing Intrinsic Amplitude 16.25 mV   Lead Channel Sensing Intrinsic Amplitude 11.125 mV   Lead Channel Pacing Threshold Amplitude 0.625 V   Lead Channel Pacing Threshold Pulse Width 0.4 ms   HighPow Impedance 78 ohm   Battery Status OK    Battery Remaining Longevity 104 mo   Battery Voltage 3 V   Brady Statistic RA Percent Paced 91.88 %   Brady Statistic RV Percent Paced 1.36 %   Brady Statistic AP VP Percent 1.26 %   Brady Statistic AS VP Percent 0.05 %   Brady Statistic AP VS Percent 94.25 %   Brady Statistic AS VS Percent  4.44 %   Eval Rhythm NSR   CBC     Status: None   Collection Time: 09/09/18  9:37 AM  Result Value Ref Range   WBC 5.9 3.4 - 10.8 x10E3/uL   RBC 4.51 3.77 - 5.28 x10E6/uL   Hemoglobin 12.8 11.1 - 15.9 g/dL   Hematocrit 78.239.3 95.634.0 - 46.6 %   MCV 87 79 - 97 fL   MCH 28.4 26.6 - 33.0 pg   MCHC 32.6 31.5 - 35.7 g/dL   RDW 21.313.1 08.611.7 - 57.815.4 %   Platelets 211 150 - 450 x10E3/uL  Basic Metabolic Panel (BMET)     Status: Abnormal   Collection Time: 09/09/18  9:37 AM  Result Value Ref Range   Glucose 168 (H) 65 - 99 mg/dL   BUN 13 8 - 27 mg/dL   Creatinine, Ser 4.691.08 (H) 0.57 - 1.00 mg/dL   GFR calc non Af Amer 53 (L) >59 mL/min/1.73   GFR calc Af Amer 61 >59 mL/min/1.73   BUN/Creatinine Ratio 12 12 - 28   Sodium 140 134 - 144 mmol/L   Potassium 4.2 3.5 - 5.2 mmol/L   Chloride 101 96 - 106 mmol/L   CO2 25 20 - 29 mmol/L   Calcium 8.8 8.7 - 10.3 mg/dL  Magnesium     Status: None   Collection Time: 09/09/18  9:37 AM  Result Value Ref Range   Magnesium 2.2 1.6 - 2.3 mg/dL  POCT INR     Status: Abnormal   Collection Time: 09/13/18  8:58 AM  Result Value Ref Range   INR 3.1 (A) 2.0 - 3.0  HgB A1c     Status: Abnormal   Collection Time: 11/01/18 11:00 AM  Result Value Ref Range   Hemoglobin A1C     HbA1c POC (<> result, manual entry)     HbA1c,  POC (prediabetic range)     HbA1c, POC (controlled diabetic range) 8.2 (A) 0.0 - 7.0 %  Basic Metabolic Panel     Status: Abnormal   Collection Time: 11/01/18 12:23 PM  Result Value Ref Range   Glucose 113 (H) 65 - 99 mg/dL   BUN 19 8 - 27 mg/dL   Creatinine, Ser 6.291.34 (H) 0.57 - 1.00 mg/dL   GFR calc non Af Amer 41 (L) >59 mL/min/1.73   GFR calc Af Amer 47 (L) >59 mL/min/1.73   BUN/Creatinine Ratio 14 12 - 28   Sodium 142 134 - 144 mmol/L   Potassium 4.2 3.5 - 5.2 mmol/L   Chloride 103 96 - 106 mmol/L   CO2 24 20 - 29 mmol/L   Calcium 8.6 (L) 8.7 - 10.3 mg/dL  POCT INR     Status: None   Collection Time: 11/08/18  8:35 AM  Result Value Ref Range   INR 2.5 2.0 - 3.0  Basic Metabolic Panel     Status: Abnormal   Collection Time: 11/16/18 11:01 AM  Result Value Ref Range   Glucose 189 (H) 65 - 99 mg/dL   BUN 19 8 - 27 mg/dL   Creatinine, Ser 5.281.18 (H) 0.57 - 1.00 mg/dL   GFR calc non Af Amer 48 (L) >59 mL/min/1.73   GFR calc Af Amer 55 (L) >59 mL/min/1.73   BUN/Creatinine Ratio 16 12 - 28   Sodium 141 134 - 144 mmol/L   Potassium 4.2 3.5 - 5.2 mmol/L   Chloride 101 96 - 106 mmol/L   CO2 25 20 - 29 mmol/L   Calcium 8.8  8.7 - 10.3 mg/dL  CUP PACEART REMOTE DEVICE CHECK     Status: None   Collection Time: 11/23/18  6:26 AM  Result Value Ref Range   Date Time Interrogation Session [id number]    Pulse Generator Manufacturer MERM    Pulse Gen Model DDMB1D1 Evera MRI XT DR    Pulse Gen Serial Number [id number]    Clinic Name [facility]    Implantable Pulse Generator Type Implantable Cardiac Defibulator    Implantable Pulse Generator Implant Date [id number]    Implantable Lead Manufacturer [employer]    Implantable Lead Model 5076 CapSureFix Novus    Implantable Lead Serial Number [id number]    Implantable Lead Implant Date [id number]    Implantable Lead Location [id number]    Implantable Lead Manufacturer [facility]    Implantable Lead Model 6935 Sprint Quattro Secure S    Implantable  Lead Serial Number [id number]    Implantable Lead Implant Date [id number]    Implantable Lead Location [id number]    Lead Channel Setting Sensing Sensitivity 0.3 mV   Lead Channel Setting Pacing Amplitude 2 V   Lead Channel Setting Pacing Pulse Width 0.4 ms   Lead Channel Setting Pacing Amplitude 2.5 V   Lead Channel Impedance Value 437 ohm   Lead Channel Sensing Intrinsic Amplitude 2.5 mV   Lead Channel Sensing Intrinsic Amplitude 2.5 mV   Lead Channel Pacing Threshold Amplitude 0.5 V   Lead Channel Pacing Threshold Pulse Width 0.4 ms   Lead Channel Impedance Value 456 ohm   Lead Channel Impedance Value 342 ohm   Lead Channel Sensing Intrinsic Amplitude 12.125 mV   Lead Channel Sensing Intrinsic Amplitude 12.125 mV   Lead Channel Pacing Threshold Amplitude 0.625 V   Lead Channel Pacing Threshold Pulse Width 0.4 ms   HighPow Impedance 73 ohm   Battery Status OK    Battery Remaining Longevity 100 mo   Battery Voltage 3 V   Brady Statistic RA Percent Paced 93.91 %   Brady Statistic RV Percent Paced 0.66 %   Brady Statistic AP VP Percent 0.59 %   Brady Statistic AS VP Percent 0.03 %   Brady Statistic AP VS Percent 95.44 %   Brady Statistic AS VS Percent 3.95 %    Objective: General: Patient is awake, alert, and oriented x 3 and in no acute distress.  Integument: Skin is warm, dry and supple bilateral. Nails are tender, long, thickened and dystrophic with subungual debris, consistent with onychomycosis, 1-5 bilateral. No signs of infection. + callus hallux, sub 3-4, and 5th toes bilateral. Remaining integument unremarkable.  Vasculature:  Dorsalis Pedis pulse 1/4 bilateral. Posterior Tibial pulse  1/4 bilateral.  Capillary fill time <3 sec 1-5 bilateral. Scant hair growth to the level of the digits. Temperature gradient within normal limits. No varicosities present bilateral. No edema present bilateral.   Neurology: The patient has intact sensation measured with a 5.07/10g Semmes  Weinstein Monofilament at all pedal sites bilateral . Vibratory sensation diminished bilateral with tuning fork. No Babinski sign present bilateral.   Musculoskeletal:  Asymptomatic pes planus, bunion and hammertoe pedal deformities noted bilateral. Muscular strength 5/5 in all lower extremity muscular groups bilateral without pain on range of motion . No tenderness with calf compression bilateral.  Assessment and Plan: Problem List Items Addressed This Visit      Nervous and Auditory   Diabetic neuropathy (HCC) (Chronic)    Other Visit Diagnoses    Pain due to onychomycosis of nail    -  Primary   Corns and callosities       Peripheral arterial disease ([place])       Type 2 diabetes mellitus with diabetic neuropathy, with long-term current use of insulin ([place])         -Examined patient. -Discussed and educated patient on diabetic foot care, especially with  regards to the vascular, neurological and musculoskeletal systems.  -Mechanically debrided callus >5 using sterile chisel blade and debrided all nails 1-5 bilateral using sterile nail nipper and filed with dremel without incident  -Dispensed toe caps bilateral hallux to use as instructed  -Continue with bag balm skin emollient of help soften callus areas -Patient to return  in 3 months for at risk foot care -Patient advised to call the office if any problems or questions arise in the meantime.  Landis Martins, DPM

## 2018-12-07 ENCOUNTER — Other Ambulatory Visit: Payer: Self-pay | Admitting: Orthopedic Surgery

## 2018-12-07 DIAGNOSIS — M545 Low back pain, unspecified: Secondary | ICD-10-CM

## 2018-12-13 ENCOUNTER — Other Ambulatory Visit: Payer: Medicare HMO

## 2018-12-14 ENCOUNTER — Other Ambulatory Visit: Payer: Medicare HMO

## 2018-12-14 ENCOUNTER — Telehealth: Payer: Self-pay | Admitting: Pulmonary Disease

## 2018-12-14 NOTE — Telephone Encounter (Signed)
Received a fax from Raliegh Ip for a surgical clearance for patient. Patient is awaiting surgery for a right total knee replacement. Surgery date is pending. Patient was last seen in our office on 06/28/2018 by [facility] for OSA. She was instructed to return to in September for her 6 month follow up.   Spoke with patient. She stated that she was waiting to get her A1C down and will get tested again next month. She wants her to have appt to be scheduled around that time as well.   She has been scheduled for 01/17/2019 with Beth. Verbalized understanding. Will keep clearance paper in my purple follow up folder.

## 2018-12-22 DIAGNOSIS — J45909 Unspecified asthma, uncomplicated: Secondary | ICD-10-CM | POA: Diagnosis not present

## 2018-12-22 DIAGNOSIS — J449 Chronic obstructive pulmonary disease, unspecified: Secondary | ICD-10-CM | POA: Diagnosis not present

## 2019-01-02 ENCOUNTER — Other Ambulatory Visit: Payer: Self-pay | Admitting: Family Medicine

## 2019-01-02 DIAGNOSIS — E1140 Type 2 diabetes mellitus with diabetic neuropathy, unspecified: Secondary | ICD-10-CM

## 2019-01-02 DIAGNOSIS — IMO0002 Reserved for concepts with insufficient information to code with codable children: Secondary | ICD-10-CM

## 2019-01-05 ENCOUNTER — Other Ambulatory Visit: Payer: Self-pay

## 2019-01-05 ENCOUNTER — Ambulatory Visit (INDEPENDENT_AMBULATORY_CARE_PROVIDER_SITE_OTHER): Payer: Medicare HMO | Admitting: Pharmacist

## 2019-01-05 DIAGNOSIS — I480 Paroxysmal atrial fibrillation: Secondary | ICD-10-CM | POA: Diagnosis not present

## 2019-01-05 DIAGNOSIS — Z7901 Long term (current) use of anticoagulants: Secondary | ICD-10-CM | POA: Diagnosis not present

## 2019-01-05 DIAGNOSIS — I4891 Unspecified atrial fibrillation: Secondary | ICD-10-CM

## 2019-01-05 LAB — POCT INR: INR: 2.4 (ref 2.0–3.0)

## 2019-01-09 ENCOUNTER — Ambulatory Visit
Admission: RE | Admit: 2019-01-09 | Discharge: 2019-01-09 | Disposition: A | Discharge status: Home / Self Care | Payer: Medicare HMO | Source: Ambulatory Visit | Attending: Orthopedic Surgery | Admitting: Orthopedic Surgery

## 2019-01-09 DIAGNOSIS — M545 Low back pain, unspecified: Secondary | ICD-10-CM

## 2019-01-12 ENCOUNTER — Other Ambulatory Visit: Payer: Self-pay

## 2019-01-12 MED ORDER — WARFARIN SODIUM 5 MG PO TABS: ORAL_TABLET | ORAL | 1 refills | Status: DC

## 2019-01-16 ENCOUNTER — Ambulatory Visit (INDEPENDENT_AMBULATORY_CARE_PROVIDER_SITE_OTHER): Payer: Medicare HMO

## 2019-01-16 DIAGNOSIS — Z95810 Presence of automatic (implantable) cardiac defibrillator: Secondary | ICD-10-CM

## 2019-01-16 DIAGNOSIS — I5022 Chronic systolic (congestive) heart failure: Secondary | ICD-10-CM | POA: Diagnosis not present

## 2019-01-17 ENCOUNTER — Other Ambulatory Visit: Payer: Self-pay

## 2019-01-17 ENCOUNTER — Ambulatory Visit: Payer: Medicare HMO | Admitting: Primary Care

## 2019-01-17 ENCOUNTER — Telehealth: Payer: Self-pay

## 2019-01-17 ENCOUNTER — Encounter: Payer: Self-pay | Admitting: Primary Care

## 2019-01-17 VITALS — BP 142/88 | HR 89 | Temp 97.3°F | Ht 66.0 in | Wt 202.2 lb

## 2019-01-17 DIAGNOSIS — Z9989 Dependence on other enabling machines and devices: Secondary | ICD-10-CM | POA: Diagnosis not present

## 2019-01-17 DIAGNOSIS — M5416 Radiculopathy, lumbar region: Secondary | ICD-10-CM | POA: Diagnosis not present

## 2019-01-17 DIAGNOSIS — J4540 Moderate persistent asthma, uncomplicated: Secondary | ICD-10-CM

## 2019-01-17 DIAGNOSIS — G4733 Obstructive sleep apnea (adult) (pediatric): Secondary | ICD-10-CM

## 2019-01-17 DIAGNOSIS — Z23 Encounter for immunization: Secondary | ICD-10-CM | POA: Diagnosis not present

## 2019-01-17 DIAGNOSIS — M545 Low back pain: Secondary | ICD-10-CM | POA: Diagnosis not present

## 2019-01-17 NOTE — Telephone Encounter (Signed)
Pt takes warfarin for afib with CHADS2VASc score of 5 (age, sex, CHF, HTN, DM). Ok to hold warfarin for 5 days prior to procedure.

## 2019-01-17 NOTE — Telephone Encounter (Signed)
Spoke with pt who already has an appt with Dr. Sallyanne Kuster on 10/30 at 9 am. Patient states she would like to keep that appt because she sees coumadin that day as well. Pt thanked me for the call.

## 2019-01-17 NOTE — Patient Instructions (Addendum)
Pleasure seeing you today, glad you are doing well   COPD: Continue Symbicort two puffs twice daily Use xopenex nebulizer/rescue inhaler every 6 hours as needed  Sleep apnea: Great work wearing CPAP every night Continue to wear 4-6 hours or more  Do not drive if experiencing excessive daytime fatigue or somnolence No changes  Office treatment: High dose flu shot today  Follow-up 6-8 months with Dr. Elsworth Soho OR sooner if you need         Diabetes Mellitus and Nutrition, Adult When you have diabetes (diabetes mellitus), it is very important to have healthy eating habits because your blood sugar (glucose) levels are greatly affected by what you eat and drink. Eating healthy foods in the appropriate amounts, at about the same times every day, can help you:  Control your blood glucose.  Lower your risk of heart disease.  Improve your blood pressure.  Reach or maintain a healthy weight. Every person with diabetes is different, and each person has different needs for a meal plan. Your health care provider may recommend that you work with a diet and nutrition specialist (dietitian) to make a meal plan that is best for you. Your meal plan may vary depending on factors such as:  The calories you need.  The medicines you take.  Your weight.  Your blood glucose, blood pressure, and cholesterol levels.  Your activity level.  Other health conditions you have, such as heart or kidney disease. How do carbohydrates affect me? Carbohydrates, also called carbs, affect your blood glucose level more than any other type of food. Eating carbs naturally raises the amount of glucose in your blood. Carb counting is a method for keeping track of how many carbs you eat. Counting carbs is important to keep your blood glucose at a healthy level, especially if you use insulin or take certain oral diabetes medicines. It is important to know how many carbs you can safely have in each meal. This is  different for every person. Your dietitian can help you calculate how many carbs you should have at each meal and for each snack. Foods that contain carbs include:  Bread, cereal, rice, pasta, and crackers.  Potatoes and corn.  Peas, beans, and lentils.  Milk and yogurt.  Fruit and juice.  Desserts, such as cakes, cookies, ice cream, and candy. How does alcohol affect me? Alcohol can cause a sudden decrease in blood glucose (hypoglycemia), especially if you use insulin or take certain oral diabetes medicines. Hypoglycemia can be a life-threatening condition. Symptoms of hypoglycemia (sleepiness, dizziness, and confusion) are similar to symptoms of having too much alcohol. If your health care provider says that alcohol is safe for you, follow these guidelines:  Limit alcohol intake to no more than 1 drink per day for nonpregnant women and 2 drinks per day for men. One drink equals 12 oz of beer, 5 oz of wine, or 1 oz of hard liquor.  Do not drink on an empty stomach.  Keep yourself hydrated with water, diet soda, or unsweetened iced tea.  Keep in mind that regular soda, juice, and other mixers may contain a lot of sugar and must be counted as carbs. What are tips for following this plan?  Reading food labels  Start by checking the serving size on the "Nutrition Facts" label of packaged foods and drinks. The amount of calories, carbs, fats, and other nutrients listed on the label is based on one serving of the item. Many items contain more than one  serving per package.  Check the total grams (g) of carbs in one serving. You can calculate the number of servings of carbs in one serving by dividing the total carbs by 15. For example, if a food has 30 g of total carbs, it would be equal to 2 servings of carbs.  Check the number of grams (g) of saturated and trans fats in one serving. Choose foods that have low or no amount of these fats.  Check the number of milligrams (mg) of salt  (sodium) in one serving. Most people should limit total sodium intake to less than 2,300 mg per day.  Always check the nutrition information of foods labeled as "low-fat" or "nonfat". These foods may be higher in added sugar or refined carbs and should be avoided.  Talk to your dietitian to identify your daily goals for nutrients listed on the label. Shopping  Avoid buying canned, premade, or processed foods. These foods tend to be high in fat, sodium, and added sugar.  Shop around the outside edge of the grocery store. This includes fresh fruits and vegetables, bulk grains, fresh meats, and fresh dairy. Cooking  Use low-heat cooking methods, such as baking, instead of high-heat cooking methods like deep frying.  Cook using healthy oils, such as olive, canola, or sunflower oil.  Avoid cooking with butter, cream, or high-fat meats. Meal planning  Eat meals and snacks regularly, preferably at the same times every day. Avoid going long periods of time without eating.  Eat foods high in fiber, such as fresh fruits, vegetables, beans, and whole grains. Talk to your dietitian about how many servings of carbs you can eat at each meal.  Eat 4-6 ounces (oz) of lean protein each day, such as lean meat, chicken, fish, eggs, or tofu. One oz of lean protein is equal to: ? 1 oz of meat, chicken, or fish. ? 1 egg. ?  cup of tofu.  Eat some foods each day that contain healthy fats, such as avocado, nuts, seeds, and fish. Lifestyle  Check your blood glucose regularly.  Exercise regularly as told by your health care provider. This may include: ? 150 minutes of moderate-intensity or vigorous-intensity exercise each week. This could be brisk walking, biking, or water aerobics. ? Stretching and doing strength exercises, such as yoga or weightlifting, at least 2 times a week.  Take medicines as told by your health care provider.  Do not use any products that contain nicotine or tobacco, such as  cigarettes and e-cigarettes. If you need help quitting, ask your health care provider.  Work with a Veterinary surgeon or diabetes educator to identify strategies to manage stress and any emotional and social challenges. Questions to ask a health care provider  Do I need to meet with a diabetes educator?  Do I need to meet with a dietitian?  What number can I call if I have questions?  When are the best times to check my blood glucose? Where to find more information:  American Diabetes Association: diabetes.org  Academy of Nutrition and Dietetics: www.eatright.[employer] of Diabetes and Digestive and Kidney Diseases (NIH): CarFlippers.tn Summary  A healthy meal plan will help you control your blood glucose and maintain a healthy lifestyle.  Working with a diet and nutrition specialist (dietitian) can help you make a meal plan that is best for you.  Keep in mind that carbohydrates (carbs) and alcohol have immediate effects on your blood glucose levels. It is important to count carbs and to  use alcohol carefully. This information is not intended to replace advice given to you by your health care provider. Make sure you discuss any questions you have with your health care provider. Document Released: 12/25/2004 Document Revised: 03/12/2017 Document Reviewed: 05/04/2016 Elsevier Patient Education  2020 [employer].

## 2019-01-17 NOTE — Telephone Encounter (Signed)
Request for surgical clearance:  1. What type of surgery is being performed? L4 L4-L5 & L5 S1 TF ESI   2. When is this surgery scheduled? TBD   3. What type of clearance is required (medical clearance vs. Pharmacy clearance to hold med vs. Both)? BOTH  4. Are there any medications that need to be held prior to surgery and how long? COUMADIN   5. Practice name and name of physician performing surgery? [facility]   6. What is your office phone number [phone]    7.   What is your office fax number  [phone]  8.   Anesthesia type (None, local, MAC, general) ? NOT LISTED

## 2019-01-17 NOTE — Assessment & Plan Note (Signed)
-   100% compliant with CPAP and reports benefit from use - Pressure 8cm H20; AHI 3.0 - No changes - Continue wearing mask every night 4-6 hours or more - Do not drive if experiencing excessive daytime fatigue - FU in 6-8 months

## 2019-01-17 NOTE — Progress Notes (Signed)
 @Patient  ID: [name], female    DOB: 01/14/1951, 68 y.o.   MRN: [id number]  Chief Complaint  Patient presents with   Follow-up    Referring provider: Unknown JimMeccariello, Bailey J, *  HPI: 68 year old female, never smoked. PMH significant for moderate persistent asthma, obstructive sleep apnea, afib, hypertension, CHF, GERD, type 2 diabetes, CKD, OA right knee. Patient of Dr. Vassie LollAlva. Maintained on Symbicort 160, Xopenex hfa/neb prn and CPAP at 8cm.   Previous [facility] pulmonary encounters: 06/28/2018 Patient presents today for a 6 month follow-up. She has been doing well, no exacerbations since last visit. Continues using Symbicort as prescribed. She has not needed to use her rescue inhaler much but always keeps it on her. Has some trouble with stairs d/t knee pain. She is compliant with CPAP use, cleans tubing every day and parts once a week. No issues with mask, recently got supplies. Denies fever, chills, sweats, wheezing, cough, leg swelling.   01/17/2019 Patient presents today for 6 month follow-up visit. She is doing well, no acute complaints. Breathing is fine. States that she stays in her house mostly. She is very serious about staying healthy during this pandemic. Continues Symbicort 160 twice daily as prescribed, she needed to use her rescue inhaler 1-2 times last month. Wearing CPAP every night, reports benefit from use. No issues with mask fit or pressure setting. Daughter lives with her. She had left knee replacement and needs her right done, moved her bed downstairs to make it easier. Denies active cough or wheezing.  Airview download: 30/30 days (100%); 30 days > 4 hours Average usage 7 hours 40 mins Pressure 8 cm H20 AHI 3.0  PFTs 06/20/18- FVC 1.45 (65%), FEV1 1.33 (78%), ratio 72, no BD response   Allergies  Allergen Reactions   Avelox [Moxifloxacin Hcl In Nacl] Other (See Comments)    Cardiac arrest per pt   Nsaids Other (See Comments)    Cardiac arrest per pt Cardiac  arrest per pt   Simvastatin Other (See Comments)    myalgias myalgias myalgias   Moxifloxacin Other (See Comments)   Ace Inhibitors Other (See Comments) and Cough    Dry cough  Dry cough  Dry cough    Jardiance [Empagliflozin] Other (See Comments)    Felt "crazy", fatigue, sweating, denies hypoglycemia while taking   Latex Rash    Immunization History  Administered Date(s) Administered   H1N1 03/23/2008   Influenza Split 01/06/2011, 01/18/2012   Influenza Whole 01/09/2008, 01/21/2009, 01/14/2010   Influenza,inj,Quad PF,6+ Mos 12/21/2012, 01/04/2014, 01/14/2015, 12/06/2015, 12/04/2016, 12/08/2017   Pneumococcal Conjugate-13 09/25/2013   Pneumococcal Polysaccharide-23 01/11/2001, 02/22/2012, 04/08/2017   Td 01/11/2001   Tdap 02/22/2012    Past Medical History:  Diagnosis Date   Anemia    Anxiety    Arthritis    Asthma    has had multiple hospitalizations for this   Atrial fibrillation (HCC)    ablation x 2 [facility], 01/2006, 2011.  on warfarin   Cardiac arrest [facility](HCC) jan 2012   in hospital for pneumonia when this occured- occured at the hospital   CHF (congestive heart failure) (HCC) 2012   Echo 08/08/10 by [facility]. EF 35-45%. LV systolic function moderately reduced. Moderate global hypokinesis of LV.  RV systolic function moderately reduced. Mild MR. Trace AR.   Complication of anesthesia    difficult time waking up after anesthesia   Depression    Diabetes mellitus    Type 2   GASTROESOPHAGEAL REFLUX,  NO ESOPHAGITIS 06/10/2006   Qualifier: Diagnosis of  By: Abundio Miu     History of hiatal hernia    Hyperlipidemia    Hypertension    Limb pain 06/04/2008   LLE, Baker's cyst in popliteal fossa, no DVT   Non-ischemic cardiomyopathy (HCC)    echo 08/08/10 - EF 35-45% LV and RV systolic function mod reduced   Primary localized osteoarthritis of right knee    Sleep apnea    wears CPAP nightly   Vocal cord disease      Tobacco History: Social History   Tobacco Use  Smoking Status Never Smoker  Smokeless Tobacco Never Used   Counseling given: Not Answered   Outpatient Medications Prior to Visit  Medication Sig Dispense Refill   bisoprolol (ZEBETA) 5 MG tablet Take 2 tablets (10 mg total) by mouth every morning AND 1 tablet (5 mg total) at bedtime. 135 tablet 3   budesonide-formoterol (SYMBICORT) 160-4.5 MCG/ACT inhaler Inhale 2 puffs into the lungs 2 (two) times daily as needed. 1 Inhaler 3   buPROPion (WELLBUTRIN XL) 150 MG 24 hr tablet Take 1 tablet (150 mg total) by mouth daily. 90 tablet 3   canagliflozin (INVOKANA) 100 MG TABS tablet Take 1 tablet (100 mg total) by mouth daily. 90 tablet 3   dofetilide (TIKOSYN) 250 MCG capsule Take 1 capsule (250 mcg total) by mouth 2 (two) times daily. 180 capsule 2   famotidine (PEPCID) 20 MG tablet Take 20 mg by mouth daily.     fluticasone (FLONASE) 50 MCG/ACT nasal spray instill 1 spray into each nostril twice a day 16 g 5   glucose blood test strip Use as instructed 100 each 12   insulin aspart (NOVOLOG FLEXPEN) 100 UNIT/ML FlexPen Inject 5-8 Units into the skin See admin instructions. Inject 5-8 units SQ before supper 15 mL 3   Insulin Glargine (BASAGLAR KWIKPEN) 100 UNIT/ML SOPN INJECT 40 UNITS UNDER THE SKIN DAILY 15 mL 1   Insulin Pen Needle (B-D ULTRAFINE III SHORT PEN) 31G X 8 MM MISC Use as directed up to 4 times daily to check blood sugar 100 each 9   levalbuterol (XOPENEX HFA) 45 MCG/ACT inhaler INHALE 1 TO 2 PUFFS BY MOUTH INTO THE LUNGS EVERY 4 HOURS IF NEEDED FOR WHEEZING 15 Inhaler 5   levalbuterol (XOPENEX) 0.63 MG/3ML nebulizer solution One vial in nebulizer four times daily as needed 360 mL 5   liraglutide (VICTOZA) 18 MG/3ML SOPN ADMINISTER 1.8 MG UNDER THE SKIN DAILY 9 mL 3   montelukast (SINGULAIR) 10 MG tablet TAKE 1 TABLET BY MOUTH AT BEDTIME 30 tablet 1   polyethylene glycol powder (GLYCOLAX/MIRALAX) powder take 17GM  (DISSOLVED IN WATER) by mouth once daily THREE TIMES A WEEK if needed as directed (Patient taking differently: Mix 17 g in liquid and drink 3 times weekly) 500 g 3   potassium chloride SA (K-DUR,KLOR-CON) 20 MEQ tablet Take 1 tablet (20 mEq total) by mouth daily as directed. 30 tablet 11   PRODIGY LANCETS 28G MISC 1 Units by Does not apply route 4 (four) times daily. 100 each 4   rosuvastatin (CRESTOR) 10 MG tablet TAKE 1 TABLET(10 MG) BY MOUTH DAILY 90 tablet 1   tiZANidine (ZANAFLEX) 2 MG tablet TK 1 T PO  QHS FOR MUSCLE SPASMS     torsemide (DEMADEX) 20 MG tablet Take 20 mg by mouth daily.     warfarin (COUMADIN) 5 MG tablet Take 1 to 1 and 1/2 daily as directed by coumadin  clinic 135 tablet 1   No facility-administered medications prior to visit.     Review of Systems  Review of Systems  Constitutional: Negative.   Respiratory: Negative.   Cardiovascular: Negative.    Physical Exam  BP (!) 142/88 (BP Location: Right Arm, Cuff Size: Large)    Pulse 89    Temp (!) 97.3 F (36.3 C) (Oral)    Ht 5\' 6"  (1.676 m)    Wt 202 lb 3.2 oz (91.7 kg)    LMP 07/26/2011    SpO2 99%    BMI 32.64 kg/m  Physical Exam Constitutional:      Appearance: Normal appearance. She is well-developed.  HENT:     Head: Normocephalic and atraumatic.     Mouth/Throat:     Mouth: Mucous membranes are moist.     Pharynx: Oropharynx is clear.  Eyes:     Pupils: Pupils are equal, round, and reactive to light.  Neck:     Musculoskeletal: Normal range of motion and neck supple.  Cardiovascular:     Rate and Rhythm: Normal rate and regular rhythm.     Heart sounds: Normal heart sounds. No murmur.  Pulmonary:     Effort: Pulmonary effort is normal. No respiratory distress.     Breath sounds: Normal breath sounds. No wheezing.  Skin:    General: Skin is warm and dry.     Findings: No erythema or rash.  Neurological:     General: No focal deficit present.     Mental Status: She is alert and oriented to  person, place, and time. Mental status is at baseline.  Psychiatric:        Mood and Affect: Mood normal.        Behavior: Behavior normal.        Thought Content: Thought content normal.        Judgment: Judgment normal.      Lab Results:  CBC    Component Value Date/Time   WBC 5.9 09/09/2018 0937   WBC 6.4 07/23/2017 1531   RBC 4.51 09/09/2018 0937   RBC 4.42 07/23/2017 1531   HGB 12.8 09/09/2018 0937   HCT 39.3 09/09/2018 0937   PLT 211 09/09/2018 0937   MCV 87 09/09/2018 0937   MCH 28.4 09/09/2018 0937   MCH 28.3 07/23/2017 1531   MCHC 32.6 09/09/2018 0937   MCHC 32.6 07/23/2017 1531   RDW 13.1 09/09/2018 0937   LYMPHSABS 3.3 07/23/2017 1531   MONOABS 0.5 07/23/2017 1531   EOSABS 0.1 07/23/2017 1531   BASOSABS 0.0 07/23/2017 1531    BMET    Component Value Date/Time   NA 141 11/16/2018 1101   K 4.2 11/16/2018 1101   CL 101 11/16/2018 1101   CO2 25 11/16/2018 1101   GLUCOSE 189 (H) 11/16/2018 1101   GLUCOSE 137 (H) 07/23/2017 1531   BUN 19 11/16/2018 1101   CREATININE 1.18 (H) 11/16/2018 1101   CREATININE 1.23 (H) 11/23/2016 0812   CALCIUM 8.8 11/16/2018 1101   GFRNONAA 48 (L) 11/16/2018 1101   GFRNONAA 54 (L) 09/02/2015 0918   GFRAA 55 (L) 11/16/2018 1101   GFRAA 62 09/02/2015 0918    BNP    Component Value Date/Time   BNP 79.5 12/31/2015 1636    ProBNP    Component Value Date/Time   PROBNP 837.0 (H) 05/12/2010 0545    Imaging: Ct Lumbar Spine Wo Contrast  Result Date: 01/09/2019 CLINICAL DATA:  Low back pain with left leg pain radiating down  the leg. EXAM: CT LUMBAR SPINE WITHOUT CONTRAST TECHNIQUE: Multidetector CT imaging of the lumbar spine was performed without intravenous contrast administration. Multiplanar CT image reconstructions were also generated. COMPARISON:  None. FINDINGS: Segmentation: 5 lumbar type vertebrae. Alignment: Normal. Vertebrae: No acute fracture or focal pathologic process. Mild osteoarthritis of bilateral SI joints.  Paraspinal and other soft tissues: No acute paraspinal abnormality. Abdominal aortic atherosclerosis. Disc levels: Degenerative disease with disc height loss at L2-3, L3-4, L4-5 and L5-S1. T12-L1: No disc protrusion, foraminal stenosis or central canal stenosis. L1-L2: No disc protrusion, foraminal stenosis or central canal stenosis. Mild bilateral facet arthropathy. L2-L3: Mild broad-based disc bulge. Mild bilateral facet arthropathy. No foraminal or central canal stenosis. L3-L4: Broad-based disc bulge with a left paracentral disc protrusion. Moderate bilateral facet arthropathy. Left lateral recess stenosis. Mild spinal stenosis. No foraminal stenosis. L4-L5: Broad-based disc bulge flattening the ventral thecal sac. Moderate bilateral facet arthropathy. No foraminal narrowing. L5-S1: Broad-based disc osteophyte complex. Right laminectomy. Moderate bilateral facet arthropathy. IMPRESSION: 1. Lumbar spine spondylosis as described above. Recommend further evaluation with a CT myelogram for better characterization of disc disease. 2.  Aortic Atherosclerosis (ICD10-I70.0). Electronically Signed   By: Elige Ko   On: 01/09/2019 15:00     Assessment & Plan:   OSA on CPAP - 100% compliant with CPAP and reports benefit from use - Pressure 8cm H20; AHI 3.0 - No changes - Continue wearing mask every night 4-6 hours or more - Do not drive if experiencing excessive daytime fatigue - FU in 6-8 months   Asthma, moderate persistent - Stable; no recent exacerbations - Rare SABA use - Continue Symbicort 160 two puffs twice daily; prn xopenex - Received high dose influenza vaccine today    Glenford Bayley, NP 01/17/2019

## 2019-01-17 NOTE — Telephone Encounter (Signed)
   Primary Cardiologist:Mihai Croitoru, MD  Chart reviewed as part of pre-operative protocol coverage. Because of TEIGAN MANNER past medical history and time since last visit, he/she will require a follow-up visit in order to better assess preoperative cardiovascular risk. Pt has had several virtual visits over the last several months, however per chart review and due to high risk medications, would prefer that the patient be seen for in-person visit. Needs EKG.   Pre-op covering staff: - Please schedule appointment and call patient to inform them. - Please contact requesting surgeon's office via preferred method (i.e, phone, fax) to inform them of need for appointment prior to surgery.  If applicable, this message will also be routed to pharmacy pool and/or primary cardiologist for input on holding anticoagulant/antiplatelet agent as requested below so that this information is available at time of patient's appointment.   Kathyrn Drown, NP  01/17/2019, 3:52 PM

## 2019-01-17 NOTE — Assessment & Plan Note (Signed)
-   Stable; no recent exacerbations - Rare SABA use - Continue Symbicort 160 two puffs twice daily; prn xopenex - Received high dose influenza vaccine today

## 2019-01-18 NOTE — Progress Notes (Signed)
Thank you :)

## 2019-01-18 NOTE — Progress Notes (Signed)
EPIC Encounter for ICM Monitoring  Patient Name: [name] is a 68 y.o. female Date: 01/18/2019 Primary Care Physican: Cleophas Dunker, DO Primary Cardiologist:Croitoru Electrophysiologist:Croitoru 01/18/2019 Weight: 197 lbs  Since 28-Nov-2018  AT/AF                  82 Time in AT/AF     0.4 hr/day (1.5%) Longest AT/AF    2 hours      Transmission reviewedand spoke with patient. She denies any fluid symptoms. She reports pinched nerve from bulging disc in back and will be getting back injections.  Optivol thoracic impedanceslightly below baseline normal.  Prescribed dosage:Torsemide 20 mg 1 tablet daily. Potassium 20 mEq 1 tabletby mouth as directed.  Labs: 11/16/2018 Creatinine 1.18, BUN 19, Potassium 4.2, Sodium 141, GFR 48-55 11/01/2018 Creatinine 1.34, BUN 19, Potassium 4.2, Sodium 142, GFR 41-47 09/09/2018 Creatinine1.08, BUN13, Potassium4.2, Sodium140, GFR53-61 05/23/2018 Creatinine1.17, BUN17, Potassium4.5, Sodium141, [id number] A complete set of results can be found in Results Review.  Recommendations: No changes and encouraged to call if experiencing any fluid symptoms.  Follow-up plan: ICM clinic phone appointment on 02/23/2019.   91 day device clinic remote transmission 02/22/2019.  Office appt 02/10/2019 with Dr. Sallyanne Kuster.    Copy of ICM check sent to Dr. Sallyanne Kuster.   3 month ICM trend: 01/16/2019    1 Year ICM trend:       [name], RN 01/18/2019 8:51 AM

## 2019-01-19 DIAGNOSIS — E119 Type 2 diabetes mellitus without complications: Secondary | ICD-10-CM | POA: Diagnosis not present

## 2019-01-21 DIAGNOSIS — J45909 Unspecified asthma, uncomplicated: Secondary | ICD-10-CM | POA: Diagnosis not present

## 2019-01-21 DIAGNOSIS — J449 Chronic obstructive pulmonary disease, unspecified: Secondary | ICD-10-CM | POA: Diagnosis not present

## 2019-01-23 ENCOUNTER — Telehealth: Payer: Self-pay | Admitting: Pharmacist Clinician (PhC)/ Clinical Pharmacy Specialist

## 2019-01-23 NOTE — Telephone Encounter (Signed)
Patient left VM, asking about holding warfarin for dental extractions.    Returned call, explained that for 1-2 extractions she need not hold warfarin dose.  Pt states will be 2-3 teeth, not sure yet.   Suggested that she try to arrange dental work to be done 2-3 days before back procedure, that way she will be off warfarin for 2-3 days already.    Patient voiced understanding.

## 2019-01-30 ENCOUNTER — Other Ambulatory Visit: Payer: Self-pay

## 2019-01-30 MED ORDER — TORSEMIDE 20 MG PO TABS: 20.0000 mg | ORAL_TABLET | Freq: Every day | ORAL | 1 refills | Status: DC

## 2019-02-07 ENCOUNTER — Ambulatory Visit: Payer: Medicare HMO | Admitting: Family Medicine

## 2019-02-10 ENCOUNTER — Ambulatory Visit (INDEPENDENT_AMBULATORY_CARE_PROVIDER_SITE_OTHER): Payer: Medicare HMO | Admitting: Pharmacist Clinician (PhC)/ Clinical Pharmacy Specialist

## 2019-02-10 ENCOUNTER — Ambulatory Visit (INDEPENDENT_AMBULATORY_CARE_PROVIDER_SITE_OTHER): Payer: Medicare HMO | Admitting: Cardiovascular Disease

## 2019-02-10 ENCOUNTER — Other Ambulatory Visit: Payer: Self-pay

## 2019-02-10 ENCOUNTER — Encounter: Payer: Self-pay | Admitting: Cardiovascular Disease

## 2019-02-10 VITALS — BP 135/84 | HR 85 | Resp 16 | Ht 66.0 in | Wt 200.2 lb

## 2019-02-10 DIAGNOSIS — G4733 Obstructive sleep apnea (adult) (pediatric): Secondary | ICD-10-CM | POA: Diagnosis not present

## 2019-02-10 DIAGNOSIS — I480 Paroxysmal atrial fibrillation: Secondary | ICD-10-CM

## 2019-02-10 DIAGNOSIS — I472 Ventricular tachycardia, unspecified: Secondary | ICD-10-CM

## 2019-02-10 DIAGNOSIS — E669 Obesity, unspecified: Secondary | ICD-10-CM

## 2019-02-10 DIAGNOSIS — I10 Essential (primary) hypertension: Secondary | ICD-10-CM

## 2019-02-10 DIAGNOSIS — E782 Mixed hyperlipidemia: Secondary | ICD-10-CM

## 2019-02-10 DIAGNOSIS — Z7901 Long term (current) use of anticoagulants: Secondary | ICD-10-CM | POA: Diagnosis not present

## 2019-02-10 DIAGNOSIS — Z79899 Other long term (current) drug therapy: Secondary | ICD-10-CM

## 2019-02-10 DIAGNOSIS — I5032 Chronic diastolic (congestive) heart failure: Secondary | ICD-10-CM | POA: Diagnosis not present

## 2019-02-10 DIAGNOSIS — I4891 Unspecified atrial fibrillation: Secondary | ICD-10-CM

## 2019-02-10 DIAGNOSIS — Z5181 Encounter for therapeutic drug level monitoring: Secondary | ICD-10-CM

## 2019-02-10 DIAGNOSIS — Z95810 Presence of automatic (implantable) cardiac defibrillator: Secondary | ICD-10-CM | POA: Diagnosis not present

## 2019-02-10 DIAGNOSIS — N1831 Chronic kidney disease, stage 3a: Secondary | ICD-10-CM

## 2019-02-10 LAB — POCT INR: INR: 1.9 — AB (ref 2.0–3.0)

## 2019-02-10 LAB — LIPID PANEL
Chol/HDL Ratio: 2.4 ratio (ref 0.0–4.4)
Cholesterol, Total: 123 mg/dL (ref 100–199)
HDL: 51 mg/dL (ref >39)
LDL Chol Calc (NIH): 54 mg/dL (ref 0–99)
Triglycerides: 92 mg/dL (ref 0–149)
VLDL Cholesterol Cal: 18 mg/dL (ref 5–40)

## 2019-02-10 LAB — COMPREHENSIVE METABOLIC PANEL
ALT: 63 IU/L — ABNORMAL HIGH (ref 0–32)
AST: 43 IU/L — ABNORMAL HIGH (ref 0–40)
Albumin/Globulin Ratio: 1.1 — ABNORMAL LOW (ref 1.2–2.2)
Albumin: 4 g/dL (ref 3.8–4.8)
Alkaline Phosphatase: 109 IU/L (ref 39–117)
BUN/Creatinine Ratio: 13 (ref 12–28)
BUN: 15 mg/dL (ref 8–27)
Bilirubin Total: 0.4 mg/dL (ref 0.0–1.2)
CO2: 23 mmol/L (ref 20–29)
Calcium: 8.8 mg/dL (ref 8.7–10.3)
Chloride: 98 mmol/L (ref 96–106)
Creatinine, Ser: 1.16 mg/dL — ABNORMAL HIGH (ref 0.57–1.00)
GFR calc Af Amer: 56 mL/min/{1.73_m2} — ABNORMAL LOW (ref >59)
GFR calc non Af Amer: 49 mL/min/{1.73_m2} — ABNORMAL LOW (ref >59)
Globulin, Total: 3.6 g/dL (ref 1.5–4.5)
Glucose: 133 mg/dL — ABNORMAL HIGH (ref 65–99)
Potassium: 4.1 mmol/L (ref 3.5–5.2)
Sodium: 140 mmol/L (ref 134–144)
Total Protein: 7.6 g/dL (ref 6.0–8.5)

## 2019-02-10 MED ORDER — APIXABAN 5 MG PO TABS: 5.0000 mg | ORAL_TABLET | Freq: Two times a day (BID) | ORAL | 11 refills | Status: DC

## 2019-02-10 MED ORDER — BISOPROLOL FUMARATE 10 MG PO TABS: 10.0000 mg | ORAL_TABLET | Freq: Two times a day (BID) | ORAL | 1 refills | Status: DC

## 2019-02-10 MED ORDER — APIXABAN 5 MG PO TABS: 5.0000 mg | ORAL_TABLET | Freq: Two times a day (BID) | ORAL | 1 refills | Status: DC

## 2019-02-10 NOTE — Progress Notes (Signed)
Patient ID: [name] Belinsky, female   DOB: 09/20/1950, [age] y.o.   MRN: [id number] Patient ID: [name] Jeziorski, female   DOB: 12/11/1950, 68 y.o.   MRN: [id number]    Cardiology Office Note    Date:  02/10/2019   ID:  [name] Mapel, DOB 06/13/1950, MRN [id number]  PCP:  Unknown JimMeccariello, Bailey J, DO  Cardiologist:   Thurmon FairMihai Rise Traeger, MD   Chief Complaint  Patient presents with  . Congestive Heart Failure  . Atrial Fibrillation  . Pacemaker Check    ICD    History of Present Illness:  [name] Eblin is a 68 y.o. female with a long-standing history of nonischemic cardiomyopathy, chronic systolic heart failure and paroxysmal atrial fibrillation.    She feels well and is proud of successful attempts at weight loss.  The patient specifically denies any chest pain at rest exertion, dyspnea at rest or with exertion, orthopnea, paroxysmal nocturnal dyspnea, syncope, palpitations, focal neurological deficits, intermittent claudication, lower extremity edema, unexplained weight gain, cough, hemoptysis or wheezing.  She has back pain and needs a lumbar spine injection. She also needs three teeth extracted. Still planning her left knee surgery.  Her defibrillator shows a stable prevalence of atrial fibrillation of 1.9%, episodes usually a few minutes/few hours.   Ventricular rate control is suboptimal.   She has not had ventricular tachycardia.    She has 96 % atrial pacing and <0.7 % ventricular pacing.  The heart rate histogram distribution is appropriate on current sensor settings.  Her Optivol is stable.  Activity level is unchanged.  She had antitachycardia pacing for ventricular tachycardia in December 2016. Since then a handful of episodes of nonsustained VT have been recorded, none meeting criteria for treatment. She is compliant with CPAP for obstructive sleep apnea.  She has a history of moderate nonischemic cardiomyopathy (no coronary disease by cardiac catheterization January 2012), recurrent paroxysmal  atrial fibrillation status post 2 ablation procedures (Dr. Orson AloeHenderson), with history of ventricular fibrillation arrest while on treatment with diltiazem and dofetilide and a prolonged QT interval, probably related to simultaneous quinolone therapy. She has a dual-chamber Medtronic Protecta defibrillator implanted in January 2012. In December 2016 her defibrillator recorded an episode of sustained monomorphic ventricular tachycardia. Antitachycardia pacing was unsuccessful at converting the arrhythmia, but she had a "dirty break" before defibrillator shock was delivered. The shock was aborted. Polymorphic VT lasting 2 seconds was recorded in the fall of 2017 during asthma exacerbation, but did not require intervention..  Most recent echo June 2020 showed normal LVEF 55-60%, but reduced GLS -14%. At LV angiography in 2012, EF was 15-20%.  Past Medical History:  Diagnosis Date  . Anemia   . Anxiety   . Arthritis   . Asthma    has had multiple hospitalizations for this  . Atrial fibrillation (HCC)    ablation x 2 [facility], 01/2006, 2011.  on warfarin  . Cardiac arrest [facility](HCC) jan 2012   in hospital for pneumonia when this occured- occured at the hospital  . CHF (congestive heart failure) (HCC) 2012   Echo 08/08/10 by [facility]. EF 35-45%. LV systolic function moderately reduced. Moderate global hypokinesis of LV.  RV systolic function moderately reduced. Mild MR. Trace AR.  Marland Kitchen. Complication of anesthesia    difficult time waking up after anesthesia  . Depression   . Diabetes mellitus    Type 2  . GASTROESOPHAGEAL REFLUX, NO ESOPHAGITIS 06/10/2006   Qualifier: Diagnosis of  By: Abundio MiuMcGregor, Barbara    .  History of hiatal hernia   . Hyperlipidemia   . Hypertension   . Limb pain 06/04/2008   LLE, Baker's cyst in popliteal fossa, no DVT  . Non-ischemic cardiomyopathy (HCC)    echo 08/08/10 - EF 35-45% LV and RV systolic function mod reduced  . Primary localized osteoarthritis of right knee   .  Sleep apnea    wears CPAP nightly  . Vocal cord disease     Past Surgical History:  Procedure Laterality Date  . BREAST EXCISIONAL BIOPSY Right 1999  . BREAST LUMPECTOMY Right   . CARDIAC DEFIBRILLATOR PLACEMENT  05/02/10   Medtronic Protecta XT-DR for CHF-VT, last download 04/12/12  . CHOLECYSTECTOMY    . COLONOSCOPY    . HAMMER TOE SURGERY Right   . ICD GENERATOR CHANGEOUT N/A 09/28/2017   Procedure: ICD GENERATOR CHANGEOUT;  Surgeon: Thurmon Fair, MD;  Location: [facility];  Service: Cardiovascular;  Laterality: N/A;  . KNEE ARTHROSCOPY Right   . PAF Ablation     By Dr Sampson Goon. Now sees Dr Steele Berg at [facility]  . TOTAL KNEE ARTHROPLASTY Left 09/17/2014   Procedure: TOTAL KNEE ARTHROPLASTY;  Surgeon: Salvatore Marvel, MD;  Location: [facility] OR;  Service: Orthopedics;  Laterality: Left;  pt has ICD  . TUBAL LIGATION      Outpatient Medications Prior to Visit  Medication Sig Dispense Refill  . budesonide-formoterol (SYMBICORT) 160-4.5 MCG/ACT inhaler Inhale 2 puffs into the lungs 2 (two) times daily as needed. 1 Inhaler 3  . buPROPion (WELLBUTRIN XL) 150 MG 24 hr tablet Take 1 tablet (150 mg total) by mouth daily. 90 tablet 3  . canagliflozin (INVOKANA) 100 MG TABS tablet Take 1 tablet (100 mg total) by mouth daily. 90 tablet 3  . dofetilide (TIKOSYN) 250 MCG capsule Take 1 capsule (250 mcg total) by mouth 2 (two) times daily. 180 capsule 2  . famotidine (PEPCID) 20 MG tablet Take 20 mg by mouth daily.    . fluticasone (FLONASE) 50 MCG/ACT nasal spray instill 1 spray into each nostril twice a day 16 g 5  . glucose blood test strip Use as instructed 100 each 12  . insulin aspart (NOVOLOG FLEXPEN) 100 UNIT/ML FlexPen Inject 5-8 Units into the skin See admin instructions. Inject 5-8 units SQ before supper 15 mL 3  . Insulin Glargine (BASAGLAR KWIKPEN) 100 UNIT/ML SOPN INJECT 40 UNITS UNDER THE SKIN DAILY 15 mL 1  . Insulin Pen Needle (B-D ULTRAFINE III SHORT PEN) 31G X 8 MM MISC Use as  directed up to 4 times daily to check blood sugar 100 each 9  . levalbuterol (XOPENEX HFA) 45 MCG/ACT inhaler INHALE 1 TO 2 PUFFS BY MOUTH INTO THE LUNGS EVERY 4 HOURS IF NEEDED FOR WHEEZING 15 Inhaler 5  . levalbuterol (XOPENEX) 0.63 MG/3ML nebulizer solution One vial in nebulizer four times daily as needed 360 mL 5  . liraglutide (VICTOZA) 18 MG/3ML SOPN ADMINISTER 1.8 MG UNDER THE SKIN DAILY 9 mL 3  . montelukast (SINGULAIR) 10 MG tablet TAKE 1 TABLET BY MOUTH AT BEDTIME 30 tablet 1  . polyethylene glycol powder (GLYCOLAX/MIRALAX) powder take 17GM (DISSOLVED IN WATER) by mouth once daily THREE TIMES A WEEK if needed as directed (Patient taking differently: Mix 17 g in liquid and drink 3 times weekly) 500 g 3  . potassium chloride SA (K-DUR,KLOR-CON) 20 MEQ tablet Take 1 tablet (20 mEq total) by mouth daily as directed. 30 tablet 11  . PRODIGY LANCETS 28G MISC 1 Units by Does  not apply route 4 (four) times daily. 100 each 4  . rosuvastatin (CRESTOR) 10 MG tablet TAKE 1 TABLET(10 MG) BY MOUTH DAILY 90 tablet 1  . tiZANidine (ZANAFLEX) 2 MG tablet TK 1 T PO  QHS FOR MUSCLE SPASMS    . torsemide (DEMADEX) 20 MG tablet Take 1 tablet (20 mg total) by mouth daily. 180 tablet 1  . bisoprolol (ZEBETA) 5 MG tablet Take 2 tablets (10 mg total) by mouth every morning AND 1 tablet (5 mg total) at bedtime. 135 tablet 3  . warfarin (COUMADIN) 5 MG tablet Take 1 to 1 and 1/2 daily as directed by coumadin clinic 135 tablet 1   No facility-administered medications prior to visit.      Allergies:   Avelox [moxifloxacin hcl in nacl], Nsaids, Simvastatin, Moxifloxacin, Ace inhibitors, Jardiance [empagliflozin], and Latex   Social History   Socioeconomic History  . Marital status: Widowed    Spouse name: Not on file  . Number of children: Not on file  . Years of education: Not on file  . Highest education level: Not on file  Occupational History  . Not on file  Social Needs  . Financial resource strain:  Not on file  . Food insecurity    Worry: Not on file    Inability: Not on file  . Transportation needs    Medical: Not on file    Non-medical: Not on file  Tobacco Use  . Smoking status: Never Smoker  . Smokeless tobacco: Never Used  Substance and Sexual Activity  . Alcohol use: No  . Drug use: No  . Sexual activity: Never  Lifestyle  . Physical activity    Days per week: Not on file    Minutes per session: Not on file  . Stress: Not on file  Relationships  . Social Musician on phone: Not on file    Gets together: Not on file    Attends religious service: Not on file    Active member of club or organization: Not on file    Attends meetings of clubs or organizations: Not on file    Relationship status: Not on file  Other Topics Concern  . Not on file  Social History Narrative   Not employed   Exercise- walking 1 hour daily.    Diet- eating healthy diet.       [facility]:   She is originally from [facility] [facility]. Has always lived in [place]. 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 Currently has a small dog. No bird or mold exposure.       Current Social History 04/08/2017        Patient lives with daughter, Lafonda Mosses, in two level home 04/08/2017   Transportation: Patient has own vehicle and drives herself 04/08/2017   Important Relationships Daughter, Lafonda Mosses 04/08/2017    Pets: Shiatzu Evaristo Bury)  04/08/2017   Education / Work:  10 th grade/ None 04/08/2017   Interests / Fun: Read, do puzzles 04/08/2017   Current Stressors: None because she prays to God 04/08/2017   Religious / Personal Beliefs: Non-Denominational 04/08/2017   L. Leward Quan, RN, BSN  Family History:  The patient's family history includes Diabetes in her brother and father; Hypertension in her brother and  father.   ROS:   Please see the history of present illness.    ROS All other systems are reviewed and are negative  PHYSICAL EXAM:   VS:  BP 135/84   Pulse 85   Resp 16   Ht 5\' 6"  (1.676 m)   Wt 200 lb 3.2 oz (90.8 kg)   LMP 07/26/2011   BMI 32.31 kg/m      General: Alert, oriented x3, no distress, mildly obese Head: no evidence of trauma, PERRL, EOMI, no exophtalmos or lid lag, no myxedema, no xanthelasma; normal ears, nose and oropharynx Neck: normal jugular venous pulsations and no hepatojugular reflux; brisk carotid pulses without delay and no carotid bruits Chest: clear to auscultation, no signs of consolidation by percussion or palpation, normal fremitus, symmetrical and full respiratory excursions Cardiovascular: normal position and quality of the apical impulse, regular rhythm, normal first and second heart sounds, no murmurs, rubs or gallops Abdomen: no tenderness or distention, no masses by palpation, no abnormal pulsatility or arterial bruits, normal bowel sounds, no hepatosplenomegaly Extremities: no clubbing, cyanosis or edema; 2+ radial, ulnar and brachial pulses bilaterally; 2+ right femoral, posterior tibial and dorsalis pedis pulses; 2+ left femoral, posterior tibial and dorsalis pedis pulses; no subclavian or femoral bruits Neurological: grossly nonfocal Psych: Normal mood and affect    Wt Readings from Last 3 Encounters:  02/10/19 200 lb 3.2 oz (90.8 kg)  01/17/19 202 lb 3.2 oz (91.7 kg)  10/19/18 202 lb (91.6 kg)      Studies/Labs Reviewed:   ECHO 09/19/2018  1. The left ventricle has normal systolic function, with an ejection fraction of 55-60%. The cavity size was normal. There is mild asymmetric left ventricular hypertrophy. Left ventricular diastolic Doppler parameters are consistent with impaired  relaxation. No evidence of left ventricular regional wall motion abnormalities.  2. The average left ventricular global longitudinal strain is 14.9 %.   3. The right ventricle has normal systolic function. The cavity was normal. There is no increase in right ventricular wall thickness.  4. Left atrial size was mildly dilated.  5. The aortic valve is tricuspid. Mild sclerosis of the aortic valve. Aortic valve regurgitation is trivial by color flow Doppler.   EKG:  EKG is ordered today. It shows A pced, V sensed rhythm, low voltage otherwise normal except appropriately prolonged QTc 498 ms (on dofetilide) Recent Labs: 09/09/2018: Hemoglobin 12.8; Magnesium 2.2; Platelets 211 02/10/2019: ALT 63; BUN 15; Creatinine, Ser 1.16; Potassium 4.1; Sodium 140   Lipid Panel    Component Value Date/Time   CHOL 123 02/10/2019 1002   TRIG 92 02/10/2019 1002   HDL 51 02/10/2019 1002   CHOLHDL 2.4 02/10/2019 1002   CHOLHDL 2.4 04/21/2016 1438   VLDL 15 04/21/2016 1438   LDLCALC 54 02/10/2019 1002   LDLDIRECT 94 12/07/2007 2019     ASSESSMENT:    1. VT (ventricular tachycardia) (HCC)   2. Paroxysmal atrial fibrillation (HCC)   3. Chronic diastolic heart failure (HCC)   4. ICD (implantable cardioverter-defibrillator) in place   5. Long term current use of anticoagulant therapy   6. Essential hypertension   7. Mixed hyperlipidemia   8. Mild obesity   9. OSA (obstructive sleep apnea)   10. Encounter for monitoring dofetilide therapy   11. Stage 3a chronic kidney disease    1. VT: Despite marked improvement in left ventricular systolic  function she has had ventricular tachycardia that required device intervention.  She is also at risk of developing torsades while in treatment with dofetilide.  ICD is appropriate. 2. PAF: At least from a symptomatic point of view her arrhythmia is well controlled with dofetilide.  Overall burden has recently is at the lowest level in years of only 1.6% .  As before, ventricular rate control is imperfect, will increase the bisoprolol dose further. 3. CHF:  euvolemic clinically and by OptiVol.  NYHA functional class  I-II .  She has lost some true weight and we will have to continually reassess her "dry weight" which is not clearly at most 200 pounds.   4. ICD: normal device function. 5. Anticoagulation: Without bleeding complications or history of embolic stroke/TIA. CHADVasc 4 (CHF, gender, HTN, DM). Recommended transition to [place], which will make management easier for her upcoming surgical procedures. 6. HTN: Well-controlled 7. HLP: On statin, all parameters at target 8. Obesity: Congratulated on success with weight loss, remains mildly obese 9. OSA: 100% compliant with CPAP and has benefit from this therapy by her report 10. Tikosyn: QTc is OK. Labs and ECG q6 months, reminded her of drug-drug interaction risks.. 11. Asthma: Has been better on bisoprolol and less selective beta-blockers in the past. No recent wheezing 12. DM: losing weight and improved control on Victoza  13. CKD: Recheck labs at least every 6 months while on dofetilide.  Most recent creatinine clearance was around 50 mL/min   For some reason her insurance charges more for 10 mg bisoprolol tabs (nonformulary) than it does for 25 mg.  PLAN:  In order of problems listed above:  Fascinating. Have not seen one there before. Medication Adjustments/Labs and Tests Ordered: Current medicines are reviewed at length with the patient today.  Concerns regarding medicines are outlined above.  Medication changes, Labs and Tests ordered today are listed in the Patient Instructions below. Patient Instructions  Medication Instructions:  STOP the warfarin START Eliquis 5 mg twice daily INCREASE the Bisoprolol to 10 mg twice daily  *If you need a refill on your cardiac medications before your next appointment, please call your pharmacy*  Lab Work: Your provider would like for you to have the following labs today: Lipid and CMET  If you have labs (blood work) drawn today and your tests are completely normal, you will receive your results only  by: Marland Kitchen MyChart Message (if you have MyChart) OR . A paper copy in the mail If you have any lab test that is abnormal or we need to change your treatment, we will call you to review the results.  Testing/Procedures: None ordered  Follow-Up: At [facility], you and your health needs are our priority.  As part of our continuing mission to provide you with exceptional heart care, we have created designated Provider Care Teams.  These Care Teams include your primary Cardiologist (physician) and Advanced Practice Providers (APPs -  Physician Assistants and Nurse Practitioners) who all work together to provide you with the care you need, when you need it.  Your next appointment:   6 months  The format for your next appointment:   In Person  Provider:   Sanda Klein, MD        Signed, Sanda Klein, MD  02/10/2019 7:19 PM    [address], [place], [place]  [address] Phone: [phone]; Fax: [phone]

## 2019-02-10 NOTE — Patient Instructions (Signed)
Medication Instructions:  STOP the warfarin START Eliquis 5 mg twice daily INCREASE the Bisoprolol to 10 mg twice daily  *If you need a refill on your cardiac medications before your next appointment, please call your pharmacy*  Lab Work: Your provider would like for you to have the following labs today: Lipid and CMET  If you have labs (blood work) drawn today and your tests are completely normal, you will receive your results only by: Marland Kitchen MyChart Message (if you have MyChart) OR . A paper copy in the mail If you have any lab test that is abnormal or we need to change your treatment, we will call you to review the results.  Testing/Procedures: None ordered  Follow-Up: At [facility], you and your health needs are our priority.  As part of our continuing mission to provide you with exceptional heart care, we have created designated Provider Care Teams.  These Care Teams include your primary Cardiologist (physician) and Advanced Practice Providers (APPs -  Physician Assistants and Nurse Practitioners) who all work together to provide you with the care you need, when you need it.  Your next appointment:   6 months  The format for your next appointment:   In Person  Provider:   Sanda Klein, MD

## 2019-02-10 NOTE — Telephone Encounter (Signed)
68 F 90.8 kg, SCr 1.18 (8/20), LOV [facility] today

## 2019-02-10 NOTE — Telephone Encounter (Signed)
Patient was seen today, RX for Eliquis was sent to the wrong pharmacy, I have removed this pharmacy. Please send to [facility] for a 90 day supply.

## 2019-02-13 MED ORDER — BUPROPION HCL ER (XL) 150 MG PO TB24: 150.0000 mg | ORAL_TABLET | Freq: Every day | ORAL | 3 refills | Status: DC

## 2019-02-14 DIAGNOSIS — R69 Illness, unspecified: Secondary | ICD-10-CM | POA: Diagnosis not present

## 2019-02-17 ENCOUNTER — Ambulatory Visit (INDEPENDENT_AMBULATORY_CARE_PROVIDER_SITE_OTHER): Payer: Medicare HMO | Admitting: Family Medicine

## 2019-02-17 ENCOUNTER — Telehealth: Payer: Self-pay

## 2019-02-17 ENCOUNTER — Other Ambulatory Visit: Payer: Self-pay

## 2019-02-17 ENCOUNTER — Encounter: Payer: Self-pay | Admitting: Family Medicine

## 2019-02-17 VITALS — BP 122/72 | HR 88 | Wt 199.2 lb

## 2019-02-17 DIAGNOSIS — I10 Essential (primary) hypertension: Secondary | ICD-10-CM | POA: Diagnosis not present

## 2019-02-17 DIAGNOSIS — E1165 Type 2 diabetes mellitus with hyperglycemia: Secondary | ICD-10-CM

## 2019-02-17 DIAGNOSIS — E669 Obesity, unspecified: Secondary | ICD-10-CM

## 2019-02-17 DIAGNOSIS — E1140 Type 2 diabetes mellitus with diabetic neuropathy, unspecified: Secondary | ICD-10-CM | POA: Diagnosis not present

## 2019-02-17 DIAGNOSIS — IMO0002 Reserved for concepts with insufficient information to code with codable children: Secondary | ICD-10-CM

## 2019-02-17 LAB — POCT GLYCOSYLATED HEMOGLOBIN (HGB A1C): HbA1c, POC (controlled diabetic range): 7.5 % — AB (ref 0.0–7.0)

## 2019-02-17 NOTE — Progress Notes (Signed)
Subjective: Chief Complaint  Patient presents with  . Diabetes     HPI: [name] is a 68 y.o. presenting to clinic today to discuss the following:  1 DM F/U  Current regimen: invokana, victoza, basaglar 40u QD, has not been using sliding scale as she states her sugars have not needed them.  Blood sugar log with her, 90s-140s Goes to podiatrist every 3 months Went to eye doctor 2 months ago - [facility]  Denies polyuria, polydipsia. States that she is tolerating her regimen well. Has been working to get A1c to 7.5 that she can have her knee replaced.  2 Obesity Patient has been working on proper diet and trying to stay as active as possible during the pandemic.  She has lost 3 pounds since May and has been maintaining her weight well.  States that she has been trying to watch what she eats.  3 HTN  On bisprolol, torsemide No CP Saw cardiology 10/30 Has not tolerated ACE inhibitor's in the past.  Health Maintenance: Reports that she sees podiatrist every 3 months and was seen by an eye doctor 2 months ago.     ROS noted in HPI. Chief complaint noted.  Other Pertinent PMH: Chronic diastolic heart failure, HLD, PAF on Eliquis now, OSA Past Medical, Surgical, Social, and Family History Reviewed & Updated per EMR.      Social History   Tobacco Use  Smoking Status Never Smoker  Smokeless Tobacco Never Used   Smoking status noted.    Objective: BP 122/72   Pulse 88   Wt 199 lb 3.2 oz (90.4 kg)   LMP 07/26/2011   SpO2 99%   BMI 32.15 kg/m  Vitals and nursing notes reviewed  Physical Exam:  General: 68 y.o. female in NAD Cardio: RRR no m/r/g Lungs: CTAB, no wheezing, no rhonchi, no crackles, no IWOB on RA Extremities: Moves all 4 extremities equally, ambulating well   Results for orders placed or performed in visit on 02/17/19 (from the past 72 hour(s))  HgB A1c     Status: Abnormal   Collection Time: 02/17/19 10:06 AM  Result Value Ref  Range   Hemoglobin A1C     HbA1c POC (<> result, manual entry)     HbA1c, POC (prediabetic range)     HbA1c, POC (controlled diabetic range) 7.5 (A) 0.0 - 7.0 %   *Note: Due to a large number of results and/or encounters for the requested time period, some results have not been displayed. A complete set of results can be found in Results Review.    Assessment/Plan:  Type 2 diabetes, uncontrolled, with neuropathy ([place]) Well-controlled.  A1c down from 8 to 7.5 today.  Patient now at goal for knee replacement.  Advised her to follow-up with [facility] and can contact office as needed for assistance.  Should continue to check blood sugars twice a day and continue with Invokana, Victoza, Basaglar 40 units daily.  She is up-to-date on eye exam and foot exam per her report.  She is also currently on Crestor 10 mg.  Return in 6 months or sooner as needed.  Obesity (BMI 30-39.9) Continue with diet and exercise.  Patient congratulated on her decrease in A1c and commitment to healthy lifestyle.  Continue with current plan.  Essential hypertension Blood pressure at goal.  Continue on bisoprolol and torsemide per cardiology.     PATIENT EDUCATION PROVIDED: See AVS    Diagnosis and plan along with any  newly prescribed medication(s) were discussed in detail with this patient today. The patient verbalized understanding and agreed with the plan. Patient advised if symptoms worsen return to clinic or ER.     Orders Placed This Encounter  Procedures  . HgB A1c    No orders of the defined types were placed in this encounter.    Luis Abed, DO 02/17/2019, 10:35 AM PGY-2 [facility]

## 2019-02-17 NOTE — Patient Instructions (Signed)
Thank you for coming to see me today. It was a pleasure. Today we talked about:   Your diabetes: Congratulations!!!! Your A1c is 7.5.  Keep up the great work!  Please follow-up with me in 6 months or sooner as needed.  If you have any questions or concerns, please do not hesitate to call the office at [phone].  Best,   [name], DO

## 2019-02-17 NOTE — Assessment & Plan Note (Signed)
Blood pressure at goal.  Continue on bisoprolol and torsemide per cardiology.

## 2019-02-17 NOTE — Telephone Encounter (Signed)
Returned call to patient as requested by voice mail message.  She wanted to share good news that she reached her A1C goal of 7.5%.  She can now schedule the knee replacement since A1C is <8%.  She is very excited to be able to get the knee surgery done hopefully in the next couple of months.  She is feeling good and denies any fluid symptoms at this time.  She appreciates all the support and encouraged her to continue with the lifestyle changes she has made.

## 2019-02-17 NOTE — Assessment & Plan Note (Addendum)
Well-controlled.  A1c down from 8 to 7.5 today.  Patient now at goal for knee replacement.  Advised her to follow-up with [facility] and can contact office as needed for assistance.  Should continue to check blood sugars twice a day and continue with Invokana, Victoza, Basaglar 40 units daily.  She is up-to-date on eye exam and foot exam per her report.  She is also currently on Crestor 10 mg.  Return in 6 months or sooner as needed.

## 2019-02-17 NOTE — Assessment & Plan Note (Signed)
Continue with diet and exercise.  Patient congratulated on her decrease in A1c and commitment to healthy lifestyle.  Continue with current plan.

## 2019-02-20 DIAGNOSIS — J45909 Unspecified asthma, uncomplicated: Secondary | ICD-10-CM | POA: Diagnosis not present

## 2019-02-20 DIAGNOSIS — J449 Chronic obstructive pulmonary disease, unspecified: Secondary | ICD-10-CM | POA: Diagnosis not present

## 2019-02-21 ENCOUNTER — Other Ambulatory Visit: Payer: Self-pay

## 2019-02-22 ENCOUNTER — Ambulatory Visit (INDEPENDENT_AMBULATORY_CARE_PROVIDER_SITE_OTHER): Payer: Medicare HMO | Admitting: *Deleted

## 2019-02-22 DIAGNOSIS — I472 Ventricular tachycardia, unspecified: Secondary | ICD-10-CM

## 2019-02-22 DIAGNOSIS — I5032 Chronic diastolic (congestive) heart failure: Secondary | ICD-10-CM

## 2019-02-22 LAB — CUP PACEART REMOTE DEVICE CHECK
Battery Remaining Longevity: 97 mo
Battery Voltage: 2.99 V
Brady Statistic AP VP Percent: 1.14 %
Brady Statistic AP VS Percent: 92.97 %
Brady Statistic AS VP Percent: 0.03 %
Brady Statistic AS VS Percent: 5.86 %
Brady Statistic RA Percent Paced: 89.73 %
Brady Statistic RV Percent Paced: 1.2 %
Date Time Interrogation Session: 20201111052529
HighPow Impedance: 77 Ohm
Implantable Lead Implant Date: 20120120
Implantable Lead Implant Date: 20120120
Implantable Lead Location: 753859
Implantable Lead Location: 753860
Implantable Lead Model: 5076
Implantable Lead Model: 6935
Implantable Pulse Generator Implant Date: 20190618
Lead Channel Impedance Value: 342 Ohm
Lead Channel Impedance Value: 437 Ohm
Lead Channel Impedance Value: 437 Ohm
Lead Channel Pacing Threshold Amplitude: 0.5 V
Lead Channel Pacing Threshold Amplitude: 0.625 V
Lead Channel Pacing Threshold Pulse Width: 0.4 ms
Lead Channel Pacing Threshold Pulse Width: 0.4 ms
Lead Channel Sensing Intrinsic Amplitude: 1 mV
Lead Channel Sensing Intrinsic Amplitude: 1 mV
Lead Channel Sensing Intrinsic Amplitude: 13.125 mV
Lead Channel Sensing Intrinsic Amplitude: 13.125 mV
Lead Channel Setting Pacing Amplitude: 2 V
Lead Channel Setting Pacing Amplitude: 2.5 V
Lead Channel Setting Pacing Pulse Width: 0.4 ms
Lead Channel Setting Sensing Sensitivity: 0.3 mV

## 2019-02-23 ENCOUNTER — Ambulatory Visit (INDEPENDENT_AMBULATORY_CARE_PROVIDER_SITE_OTHER): Payer: Medicare HMO

## 2019-02-23 DIAGNOSIS — I5032 Chronic diastolic (congestive) heart failure: Secondary | ICD-10-CM | POA: Diagnosis not present

## 2019-02-23 DIAGNOSIS — Z95810 Presence of automatic (implantable) cardiac defibrillator: Secondary | ICD-10-CM

## 2019-02-24 NOTE — Progress Notes (Signed)
EPIC Encounter for ICM Monitoring  Patient Name: [name] is a 68 y.o. female Date: 02/24/2019 Primary Care Physican: Cleophas Dunker, DO Primary Cardiologist:Croitoru Electrophysiologist:Croitoru 02/24/2019 Weight: 194 lbs             Transmission reviewedand spoke with patient. She denies any fluid symptoms. Her last A1C is <8% and is scheduling her total knee surgery.  Optivol thoracic impedancenormal.  Prescribed dosage:Torsemide 20 mg 1 tablet daily. Potassium 20 mEq 1 tabletby mouth as directed.  Labs: 11/16/2018 Creatinine 1.18, BUN 19, Potassium 4.2, Sodium 141, GFR 48-55 11/01/2018 Creatinine 1.34, BUN 19, Potassium 4.2, Sodium 142, GFR 41-47 09/09/2018 Creatinine1.08, BUN13, Potassium4.2, Sodium140, GFR53-61 05/23/2018 Creatinine1.17, BUN17, Potassium4.5, Sodium141, BMW41-32 A complete set of results can be found in Results Review.  Recommendations: No changes and encouraged to call if experiencing any fluid symptoms.  Follow-up plan: ICM clinic phone appointment on 03/27/2019.   91 day device clinic remote transmission 05/23/2018.    Copy of ICM check sent to Dr. Sallyanne Kuster.   3 month ICM trend: 02/22/2019    1 Year ICM trend:       [name], RN 02/24/2019 2:26 PM

## 2019-03-01 ENCOUNTER — Telehealth: Payer: Self-pay

## 2019-03-01 NOTE — Telephone Encounter (Signed)
Returned call to patient as requested by voice mail message.  She said her total knee surgery has been scheduled for 04/17/2019.  She has completed all her dental work as of today and doing well.  She voice no complaints today.

## 2019-03-07 ENCOUNTER — Encounter: Payer: Self-pay | Admitting: Sports Medicine

## 2019-03-07 ENCOUNTER — Ambulatory Visit: Payer: Medicare HMO | Admitting: Sports Medicine

## 2019-03-07 ENCOUNTER — Other Ambulatory Visit: Payer: Self-pay

## 2019-03-07 ENCOUNTER — Other Ambulatory Visit: Payer: Self-pay | Admitting: Family Medicine

## 2019-03-07 DIAGNOSIS — I739 Peripheral vascular disease, unspecified: Secondary | ICD-10-CM | POA: Diagnosis not present

## 2019-03-07 DIAGNOSIS — M79676 Pain in unspecified toe(s): Secondary | ICD-10-CM | POA: Diagnosis not present

## 2019-03-07 DIAGNOSIS — Z794 Long term (current) use of insulin: Secondary | ICD-10-CM

## 2019-03-07 DIAGNOSIS — E1140 Type 2 diabetes mellitus with diabetic neuropathy, unspecified: Secondary | ICD-10-CM

## 2019-03-07 DIAGNOSIS — L84 Corns and callosities: Secondary | ICD-10-CM | POA: Diagnosis not present

## 2019-03-07 DIAGNOSIS — M79609 Pain in unspecified limb: Secondary | ICD-10-CM

## 2019-03-07 DIAGNOSIS — B351 Tinea unguium: Secondary | ICD-10-CM | POA: Diagnosis not present

## 2019-03-07 DIAGNOSIS — E0842 Diabetes mellitus due to underlying condition with diabetic polyneuropathy: Secondary | ICD-10-CM

## 2019-03-07 DIAGNOSIS — IMO0002 Reserved for concepts with insufficient information to code with codable children: Secondary | ICD-10-CM

## 2019-03-07 NOTE — Progress Notes (Signed)
Subjective: [name] is a 68 y.o. female patient with history of diabetes who presents to office today complaining of long,mildly painful nails  while ambulating in shoes; unable to trim. Patient states that the glucose reading this morning was 145 still on Victoza. No new issues besides will be getting knee surgery on right.  Last, A1c ~7.5 like before.   Patient Active Problem List   Diagnosis Date Noted  . Iliotibial band syndrome, left 11/01/2018  . GERD (gastroesophageal reflux disease) 03/23/2018  . Chronic diastolic heart failure (HCC) 01/07/2018  . Mild obesity 01/07/2018  . Arthritis of carpometacarpal (CMC) joint of thumb 03/26/2016  . Primary localized osteoarthritis of right knee   . Visit for monitoring Tikosyn therapy 08/21/2015  . Long term current use of anticoagulant therapy 08/21/2015  . Ventricular tachycardia (HCC) 05/22/2015  . Obesity (BMI 30-39.9) 01/04/2014  . ICD (St. Jude Protecta dual-chamber),secondary prevention (VF arrest) January 2012 11/19/2012  . CKD (chronic kidney disease) stage 2, GFR 60-89 ml/min 02/22/2012  . Asthma, moderate persistent 12/10/2011  . Diabetic neuropathy (HCC) 09/18/2011  . Osteoarthritis of right knee 08/28/2008  . Type 2 diabetes, uncontrolled, with neuropathy (HCC) 06/10/2006  . HYPERCHOLESTEROLEMIA 06/10/2006  . Essential hypertension 06/10/2006  . OSA on CPAP 06/10/2006   Current Outpatient Medications on File Prior to Visit  Medication Sig Dispense Refill  . apixaban (ELIQUIS) 5 MG TABS tablet Take 1 tablet (5 mg total) by mouth 2 (two) times daily. 180 tablet 1  . bisoprolol (ZEBETA) 10 MG tablet Take 1 tablet (10 mg total) by mouth 2 (two) times daily. 180 tablet 1  . budesonide-formoterol (SYMBICORT) 160-4.5 MCG/ACT inhaler Inhale 2 puffs into the lungs 2 (two) times daily as needed. 1 Inhaler 3  . buPROPion (WELLBUTRIN XL) 150 MG 24 hr tablet Take 1 tablet (150 mg total) by mouth daily. 90 tablet 3  . canagliflozin  (INVOKANA) 100 MG TABS tablet Take 1 tablet (100 mg total) by mouth daily. 90 tablet 3  . dofetilide (TIKOSYN) 250 MCG capsule Take 1 capsule (250 mcg total) by mouth 2 (two) times daily. 180 capsule 2  . famotidine (PEPCID) 20 MG tablet Take 20 mg by mouth daily.    . fluticasone (FLONASE) 50 MCG/ACT nasal spray instill 1 spray into each nostril twice a day 16 g 5  . glucose blood test strip Use as instructed 100 each 12  . insulin aspart (NOVOLOG FLEXPEN) 100 UNIT/ML FlexPen Inject 5-8 Units into the skin See admin instructions. Inject 5-8 units SQ before supper 15 mL 3  . Insulin Glargine (BASAGLAR KWIKPEN) 100 UNIT/ML SOPN INJECT 40 UNITS UNDER THE SKIN DAILY 15 mL 1  . Insulin Pen Needle (B-D ULTRAFINE III SHORT PEN) 31G X 8 MM MISC Use as directed up to 4 times daily to check blood sugar 100 each 9  . levalbuterol (XOPENEX HFA) 45 MCG/ACT inhaler INHALE 1 TO 2 PUFFS BY MOUTH INTO THE LUNGS EVERY 4 HOURS IF NEEDED FOR WHEEZING 15 Inhaler 5  . levalbuterol (XOPENEX) 0.63 MG/3ML nebulizer solution One vial in nebulizer four times daily as needed 360 mL 5  . liraglutide (VICTOZA) 18 MG/3ML SOPN ADMINISTER 1.8 MG UNDER THE SKIN DAILY 9 mL 3  . montelukast (SINGULAIR) 10 MG tablet TAKE 1 TABLET BY MOUTH AT BEDTIME 30 tablet 1  . polyethylene glycol powder (GLYCOLAX/MIRALAX) powder take 17GM (DISSOLVED IN WATER) by mouth once daily THREE TIMES A WEEK if needed as directed (Patient taking differently: Mix 17 g  in liquid and drink 3 times weekly) 500 g 3  . potassium chloride SA (K-DUR,KLOR-CON) 20 MEQ tablet Take 1 tablet (20 mEq total) by mouth daily as directed. 30 tablet 11  . PRODIGY LANCETS 28G MISC 1 Units by Does not apply route 4 (four) times daily. 100 each 4  . rosuvastatin (CRESTOR) 10 MG tablet TAKE 1 TABLET(10 MG) BY MOUTH DAILY 90 tablet 1  . tiZANidine (ZANAFLEX) 2 MG tablet TK 1 T PO  QHS FOR MUSCLE SPASMS    . torsemide (DEMADEX) 20 MG tablet Take 1 tablet (20 mg total) by mouth  daily. 180 tablet 1   No current facility-administered medications on file prior to visit.    Allergies  Allergen Reactions  . Avelox [Moxifloxacin Hcl In Nacl] Other (See Comments)    Cardiac arrest per pt  . Nsaids Other (See Comments)    Cardiac arrest per pt Cardiac arrest per pt  . Simvastatin Other (See Comments)    myalgias myalgias myalgias  . Moxifloxacin Other (See Comments)  . Ace Inhibitors Other (See Comments) and Cough    Dry cough  Dry cough  Dry cough   . Jardiance [Empagliflozin] Other (See Comments)    Felt "crazy", fatigue, sweating, denies hypoglycemia while taking  . Latex Rash    Recent Results (from the past 2160 hour(s))  POCT INR     Status: None   Collection Time: 01/05/19  9:30 AM  Result Value Ref Range   INR 2.4 2.0 - 3.0  POCT INR     Status: Abnormal   Collection Time: 02/10/19  9:43 AM  Result Value Ref Range   INR 1.9 (A) 2.0 - 3.0  Lipid panel     Status: None   Collection Time: 02/10/19 10:02 AM  Result Value Ref Range   Cholesterol, Total 123 100 - 199 mg/dL   Triglycerides 92 0 - 149 mg/dL   HDL 51 >31 mg/dL   VLDL Cholesterol Cal 18 5 - 40 mg/dL   LDL Chol Calc (NIH) 54 0 - 99 mg/dL   Chol/HDL Ratio 2.4 0.0 - 4.4 ratio    Comment:                                   T. Chol/HDL Ratio                                             Men  Women                               1/2 Avg.Risk  3.4    3.3                                   Avg.Risk  5.0    4.4                                2X Avg.Risk  9.6    7.1  3X Avg.Risk 23.4   11.0   Comprehensive metabolic panel     Status: Abnormal   Collection Time: 02/10/19 10:02 AM  Result Value Ref Range   Glucose 133 (H) 65 - 99 mg/dL   BUN 15 8 - 27 mg/dL   Creatinine, Ser 1.16 (H) 0.57 - 1.00 mg/dL   GFR calc non Af Amer 49 (L) >59 mL/min/1.73   GFR calc Af Amer 56 (L) >59 mL/min/1.73   BUN/Creatinine Ratio 13 12 - 28   Sodium 140 134 - 144 mmol/L    Potassium 4.1 3.5 - 5.2 mmol/L   Chloride 98 96 - 106 mmol/L   CO2 23 20 - 29 mmol/L   Calcium 8.8 8.7 - 10.3 mg/dL   Total Protein 7.6 6.0 - 8.5 g/dL   Albumin 4.0 3.8 - 4.8 g/dL   Globulin, Total 3.6 1.5 - 4.5 g/dL   Albumin/Globulin Ratio 1.1 (L) 1.2 - 2.2   Bilirubin Total 0.4 0.0 - 1.2 mg/dL   Alkaline Phosphatase 109 39 - 117 IU/L   AST 43 (H) 0 - 40 IU/L   ALT 63 (H) 0 - 32 IU/L  HgB A1c     Status: Abnormal   Collection Time: 02/17/19 10:06 AM  Result Value Ref Range   Hemoglobin A1C     HbA1c POC (<> result, manual entry)     HbA1c, POC (prediabetic range)     HbA1c, POC (controlled diabetic range) 7.5 (A) 0.0 - 7.0 %  CUP PACEART REMOTE DEVICE CHECK     Status: None   Collection Time: 02/22/19  8:35 AM  Result Value Ref Range   Date Time Interrogation Session [id number]    Pulse Generator Manufacturer MERM    Pulse Gen Model [id number] Evera MRI XT DR    Pulse Gen Serial Number [id number]    Clinic Name [facility]    Implantable Pulse Generator Type Implantable Cardiac Defibulator    Implantable Pulse Generator Implant Date 88416606    Implantable Lead Manufacturer [facility]    Implantable Lead Model 5076 CapSureFix Novus    Implantable Lead Serial Number [id number]    Implantable Lead Implant Date 30160109    Implantable Lead Location [id number]    Implantable Lead Manufacturer [facility]    Implantable Lead Model 6935 Sprint Quattro Secure S    Implantable Lead Serial Number [id number]    Implantable Lead Implant Date [id number]    Implantable Lead Location [id number]    Lead Channel Setting Sensing Sensitivity 0.3 mV   Lead Channel Setting Pacing Amplitude 2 V   Lead Channel Setting Pacing Pulse Width 0.4 ms   Lead Channel Setting Pacing Amplitude 2.5 V   Lead Channel Impedance Value 437 ohm   Lead Channel Sensing Intrinsic Amplitude 1 mV   Lead Channel Sensing Intrinsic Amplitude 1 mV   Lead Channel Pacing Threshold Amplitude 0.5 V   Lead Channel Pacing Threshold Pulse  Width 0.4 ms   Lead Channel Impedance Value 437 ohm   Lead Channel Impedance Value 342 ohm   Lead Channel Sensing Intrinsic Amplitude 13.125 mV   Lead Channel Sensing Intrinsic Amplitude 13.125 mV   Lead Channel Pacing Threshold Amplitude 0.625 V   Lead Channel Pacing Threshold Pulse Width 0.4 ms   HighPow Impedance 77 ohm   Battery Status OK    Battery Remaining Longevity 97 mo   Battery Voltage 2.99 V   Brady Statistic RA Percent Paced 89.73 %   Brady Statistic RV Percent Paced 1.2 %  Brady Statistic AP VP Percent 1.14 %   Huston Foley Statistic AS VP Percent 0.03 %   Brady Statistic AP VS Percent 92.97 %   Brady Statistic AS VS Percent 5.86 %    Objective: General: Patient is awake, alert, and oriented x 3 and in no acute distress.  Integument: Skin is warm, dry and supple bilateral. Nails are tender, long, thickened and dystrophic with subungual debris, consistent with onychomycosis, 1-5 bilateral. No signs of infection. + callus hallux, sub 3-4, and 5th toes bilateral. Remaining integument unremarkable.  Vasculature:  Dorsalis Pedis pulse 1/4 bilateral. Posterior Tibial pulse  1/4 bilateral. Capillary fill time <3 sec 1-5 bilateral. Scant hair growth to the level of the digits.Temperature gradient within normal limits. No varicosities present bilateral. No edema present bilateral.   Neurology: The patient has intact sensation measured with a 5.07/10g Semmes Weinstein Monofilament at all pedal sites bilateral . Vibratory sensation diminished bilateral with tuning fork. No Babinski sign present bilateral.   Musculoskeletal:  Asymptomatic pes planus, bunion and hammertoe pedal deformities noted bilateral. Muscular strength 5/5 in all lower extremity muscular groups bilateral without pain on range of motion . No tenderness with calf compression bilateral.  Assessment and Plan: Problem List Items Addressed This Visit      Endocrine   Diabetic neuropathy (HCC) (Chronic)    Other Visit  Diagnoses    Pain due to onychomycosis of nail    -  Primary   Corns and callosities       Peripheral arterial disease (HCC)       Type 2 diabetes mellitus with diabetic neuropathy, with long-term current use of insulin (HCC)         -Examined patient. -Re-discussed and educated patient on diabetic foot care, especially with regards to the vascular, neurological and musculoskeletal systems.  -Mechanically debrided callus >5 using sterile chisel blade and debrided all nails 1-5 bilateral using sterile nail nipper and filed with dremel without incident  -Continue with toe caps bilateral hallux to use as instructed  -Continue with bag balm skin emollient of help soften callus areas like before or sample of foot miracle cream as dispensed -Completed Handicap paperwork -Patient to return  in 3 months for at risk foot care -Patient advised to call the office if any problems or questions arise in the meantime.  Asencion Islam, DPM

## 2019-03-13 ENCOUNTER — Other Ambulatory Visit: Payer: Self-pay | Admitting: Family Medicine

## 2019-03-13 DIAGNOSIS — IMO0002 Reserved for concepts with insufficient information to code with codable children: Secondary | ICD-10-CM

## 2019-03-13 DIAGNOSIS — E1140 Type 2 diabetes mellitus with diabetic neuropathy, unspecified: Secondary | ICD-10-CM

## 2019-03-16 ENCOUNTER — Other Ambulatory Visit: Payer: Self-pay

## 2019-03-16 NOTE — Progress Notes (Signed)
Remote ICD transmission.   

## 2019-03-17 NOTE — Telephone Encounter (Signed)
Pt calling to check status. [name], CMA  

## 2019-03-19 MED ORDER — FAMOTIDINE 20 MG PO TABS: 20.0000 mg | ORAL_TABLET | Freq: Every day | ORAL | 3 refills | Status: DC

## 2019-03-20 ENCOUNTER — Other Ambulatory Visit: Payer: Self-pay

## 2019-03-20 ENCOUNTER — Other Ambulatory Visit: Payer: Self-pay | Admitting: Cardiovascular Disease

## 2019-03-20 MED ORDER — DOFETILIDE 250 MCG PO CAPS: 250.0000 ug | ORAL_CAPSULE | Freq: Two times a day (BID) | ORAL | 3 refills | Status: DC

## 2019-03-20 MED ORDER — DOFETILIDE 250 MCG PO CAPS: 250.0000 ug | ORAL_CAPSULE | Freq: Two times a day (BID) | ORAL | 1 refills | Status: DC

## 2019-03-20 NOTE — Telephone Encounter (Signed)
New Message   *STAT* If patient is at the pharmacy, call can be transferred to refill team.   1. Which medications need to be refilled? (please list name of each medication and dose if known)dofetilide (TIKOSYN) 250 MCG capsule  2. Which pharmacy/location (including street and city if local pharmacy) is medication to be sent to? [facility] [place], [place]  3. Do they need a 30 day or 90 day supply? 90 day

## 2019-03-20 NOTE — Telephone Encounter (Signed)
Refill sent.

## 2019-03-22 DIAGNOSIS — J45909 Unspecified asthma, uncomplicated: Secondary | ICD-10-CM | POA: Diagnosis not present

## 2019-03-22 DIAGNOSIS — J449 Chronic obstructive pulmonary disease, unspecified: Secondary | ICD-10-CM | POA: Diagnosis not present

## 2019-03-27 ENCOUNTER — Ambulatory Visit (INDEPENDENT_AMBULATORY_CARE_PROVIDER_SITE_OTHER): Payer: Medicare HMO

## 2019-03-27 DIAGNOSIS — I5032 Chronic diastolic (congestive) heart failure: Secondary | ICD-10-CM

## 2019-03-27 DIAGNOSIS — Z95810 Presence of automatic (implantable) cardiac defibrillator: Secondary | ICD-10-CM

## 2019-03-29 ENCOUNTER — Telehealth: Payer: Self-pay

## 2019-03-29 NOTE — Telephone Encounter (Signed)
Unable to speak with patient to remind of missed remote transmission 12/15 

## 2019-03-31 ENCOUNTER — Telehealth: Payer: Self-pay

## 2019-03-31 NOTE — Telephone Encounter (Signed)
Returned patient call as requested by voice mail message.  Her knee replacement surgery has been canceled for January to due to COVID issues.  The orthopedic surgeon is only scheduling health patients that are able to go home same day of surgery.  The surgeon is worried about COVID and will reschedule her knee surgery for March or April.  Her current weight is 190 lbs.  She does not feel like she has any fluid accumulation and doing well.  Requested she send remote transmission today for ICM review.  Advised if the report is normal then I will not call her back today. Encouraged to call for any fluid accumulation symptoms.

## 2019-03-31 NOTE — Progress Notes (Signed)
EPIC Encounter for ICM Monitoring  Patient Name: [name] is a 68 y.o. female Date: 03/31/2019 Primary Care Physican: Cleophas Dunker, DO Primary Cardiologist:Croitoru Electrophysiologist:Croitoru 03/31/2019 Weight: 191lbs  Clinical Status (22-Feb-2019 to 31-Mar-2019)  AT/AF                          62 episodes  Time in AT/AF  0.2 hr/day (0.8%) Longest AT/AF  74 minutes                                                                  Transmission reviewedand spoke with patient. She denies any fluid symptoms.Knee surgery has been postponed until March or April by Orthopedic surgeon due to increase in [place] cases.   Optivol thoracic impedancenormal.  Prescribed dosage:Torsemide 20 mg 1 tablet daily. Potassium 20 mEq 1 tabletby mouth as directed.  Labs: 11/16/2018 Creatinine 1.18, BUN 19, Potassium 4.2, Sodium 141, GFR 48-55 11/01/2018 Creatinine 1.34, BUN 19, Potassium 4.2, Sodium 142, GFR 41-47 09/09/2018 Creatinine1.08, BUN13, Potassium4.2, Sodium140, GFR53-61 05/23/2018 Creatinine1.17, BUN17, Potassium4.5, Sodium141, GGY69-48 A complete set of results can be found in Results Review.  Recommendations: No changes and encouraged to call if experiencing any fluid symptoms.  Follow-up plan: ICM clinic phone appointment on 05/25/2019.   91 day device clinic remote transmission 05/23/2018.  Office visit with Chanetta Marshall, NP 05/04/2019.  Copy of ICM check sent to Dr. Sallyanne Kuster.   3 month ICM trend: 03/31/2019    1 Year ICM trend:       [name], RN 03/31/2019 5:14 PM

## 2019-04-06 ENCOUNTER — Other Ambulatory Visit: Payer: Self-pay | Admitting: Pulmonary Disease

## 2019-04-10 ENCOUNTER — Encounter (HOSPITAL_COMMUNITY): Admission: RE | Admit: 2019-04-10 | Payer: Medicare HMO | Source: Ambulatory Visit

## 2019-04-17 ENCOUNTER — Inpatient Hospital Stay: Admit: 2019-04-17 | Payer: Medicare HMO | Admitting: Orthopedic Surgery

## 2019-04-17 DIAGNOSIS — R2689 Other abnormalities of gait and mobility: Secondary | ICD-10-CM | POA: Diagnosis not present

## 2019-04-17 DIAGNOSIS — M6281 Muscle weakness (generalized): Secondary | ICD-10-CM | POA: Diagnosis not present

## 2019-04-17 DIAGNOSIS — M1711 Unilateral primary osteoarthritis, right knee: Secondary | ICD-10-CM | POA: Diagnosis not present

## 2019-04-17 DIAGNOSIS — M5136 Other intervertebral disc degeneration, lumbar region: Secondary | ICD-10-CM | POA: Diagnosis not present

## 2019-04-17 SURGERY — ARTHROPLASTY, KNEE, TOTAL
Anesthesia: Spinal | Site: Knee | Laterality: Right

## 2019-04-22 DIAGNOSIS — J45909 Unspecified asthma, uncomplicated: Secondary | ICD-10-CM | POA: Diagnosis not present

## 2019-04-22 DIAGNOSIS — J449 Chronic obstructive pulmonary disease, unspecified: Secondary | ICD-10-CM | POA: Diagnosis not present

## 2019-04-25 ENCOUNTER — Telehealth: Payer: Self-pay | Admitting: *Deleted

## 2019-04-25 NOTE — Telephone Encounter (Signed)
Received fax from pharmacy, PA needed on invokana.  Clinical questions submitted via Cover My Meds.  Waiting on response, could take up to 72 hours.  Cover My Meds info: Key: Cathlyn Parsons, CMA

## 2019-04-25 NOTE — Telephone Encounter (Signed)
Med approved  04/14/2019 - 04/12/2020, lmovm of pharmacy. Jone Baseman, CMA

## 2019-04-28 DIAGNOSIS — M1711 Unilateral primary osteoarthritis, right knee: Secondary | ICD-10-CM | POA: Diagnosis not present

## 2019-04-28 DIAGNOSIS — M2689 Other dentofacial anomalies: Secondary | ICD-10-CM | POA: Diagnosis not present

## 2019-04-28 DIAGNOSIS — M5136 Other intervertebral disc degeneration, lumbar region: Secondary | ICD-10-CM | POA: Diagnosis not present

## 2019-04-28 DIAGNOSIS — M6281 Muscle weakness (generalized): Secondary | ICD-10-CM | POA: Diagnosis not present

## 2019-05-01 ENCOUNTER — Telehealth: Payer: Self-pay | Admitting: Cardiovascular Disease

## 2019-05-01 ENCOUNTER — Other Ambulatory Visit: Payer: Self-pay

## 2019-05-01 MED ORDER — BISOPROLOL FUMARATE 10 MG PO TABS: 10.0000 mg | ORAL_TABLET | Freq: Two times a day (BID) | ORAL | 2 refills | Status: DC

## 2019-05-01 NOTE — Telephone Encounter (Signed)
New Message        *STAT* If patient is at the pharmacy, call can be transferred to refill team.   1. Which medications need to be refilled? (please list name of each medication and dose if known) bisoprolol (ZEBETA) 10 MG tablet  2. Which pharmacy/location (including street and city if local pharmacy) is medication to be sent to? [id number] - [place], [place] - [address] AT [facility] [address]  3. Do they need a 30 day or 90 day supply? 90

## 2019-05-03 NOTE — Progress Notes (Signed)
Electrophysiology Office Note Date: 05/04/2019  ID:  ARNIKA [name], DOB [birth year], MRN [id number]  PCP: Cleophas Dunker, DO Primary Cardiologist: Croitoru Electrophysiologist: Allred  CC: Routine ICD follow-up  [name] is a 69 y.o. female seen today for Dr Rayann Heman.  She presents today for routine electrophysiology followup.  Since last being seen in our clinic, the patient reports doing relatively well.  She has some days where she is more fatigued than others.  She denies chest pain, palpitations, dyspnea, PND, orthopnea, nausea, vomiting, dizziness, syncope, edema, weight gain, or early satiety.  She has not had ICD shocks.   Device History: MDT dual chamber ICD implanted 2012 for NICM, VF arrest History of appropriate therapy: Yes  (ATP) History of AAD therapy: yes - Tikosyn for AF  Past Medical History:  Diagnosis Date  . Anemia   . Anxiety   . Arthritis   . Asthma    has had multiple hospitalizations for this  . Atrial fibrillation ([place])    ablation x 2 [facility], 01/2006, 2011.  on warfarin  . Cardiac arrest [facility]) jan 2012   in hospital for pneumonia when this occured- occured at the hospital  . CHF (congestive heart failure) ([place]) 2012   Echo 08/08/10 by [facility]. EF 35-45%. LV systolic function moderately reduced. Moderate global hypokinesis of LV.  RV systolic function moderately reduced. Mild MR. Trace AR.  Marland Kitchen Complication of anesthesia    difficult time waking up after anesthesia  . Depression   . Diabetes mellitus    Type 2  . GASTROESOPHAGEAL REFLUX, NO ESOPHAGITIS 06/10/2006   Qualifier: Diagnosis of  By: Eusebio Friendly    . History of hiatal hernia   . Hyperlipidemia   . Hypertension   . Limb pain 06/04/2008   LLE, Baker's cyst in popliteal fossa, no DVT  . Non-ischemic cardiomyopathy ([place])    echo 08/08/10 - EF 32-99% LV and RV systolic function mod reduced  . Primary localized osteoarthritis of right knee   . Sleep apnea    wears CPAP  nightly  . Vocal cord disease    Past Surgical History:  Procedure Laterality Date  . BREAST EXCISIONAL BIOPSY Right 1999  . BREAST LUMPECTOMY Right   . CARDIAC DEFIBRILLATOR PLACEMENT  05/02/10   Medtronic Protecta XT-DR for CHF-VT, last download 04/12/12  . CHOLECYSTECTOMY    . COLONOSCOPY    . HAMMER TOE SURGERY Right   . ICD GENERATOR CHANGEOUT N/A 09/28/2017   Procedure: ICD GENERATOR CHANGEOUT;  Surgeon: Sanda Klein, MD;  Location: [facility];  Service: Cardiovascular;  Laterality: N/A;  . KNEE ARTHROSCOPY Right   . PAF Ablation     By Dr Ola Spurr. Now sees Dr Tilden Dome at [facility] Left 09/17/2014   Procedure: TOTAL KNEE ARTHROPLASTY;  Surgeon: Elsie Saas, MD;  Location: [place];  Service: Orthopedics;  Laterality: Left;  pt has ICD  . TUBAL LIGATION      Current Outpatient Medications  Medication Sig Dispense Refill  . apixaban (ELIQUIS) 5 MG TABS tablet Take 1 tablet (5 mg total) by mouth 2 (two) times daily. 180 tablet 1  . bisoprolol (ZEBETA) 10 MG tablet Take 1 tablet (10 mg total) by mouth 2 (two) times daily. 180 tablet 2  . budesonide-formoterol (SYMBICORT) 160-4.5 MCG/ACT inhaler Inhale 2 puffs into the lungs 2 (two) times daily as needed. 1 Inhaler 3  . buPROPion (WELLBUTRIN XL) 150 MG 24 hr tablet  Take 1 tablet (150 mg total) by mouth daily. 90 tablet 3  . canagliflozin (INVOKANA) 100 MG TABS tablet Take 1 tablet (100 mg total) by mouth daily. 90 tablet 3  . dofetilide (TIKOSYN) 250 MCG capsule Take 1 capsule (250 mcg total) by mouth 2 (two) times daily. 180 capsule 3  . famotidine (PEPCID) 20 MG tablet Take 1 tablet (20 mg total) by mouth daily. 90 tablet 3  . fluticasone (FLONASE) 50 MCG/ACT nasal spray instill 1 spray into each nostril twice a day 16 g 5  . glucose blood test strip Use as instructed 100 each 12  . insulin aspart (NOVOLOG FLEXPEN) 100 UNIT/ML FlexPen Inject 5-8 Units into the skin See admin instructions. Inject 5-8  units SQ before supper 15 mL 3  . Insulin Glargine (BASAGLAR KWIKPEN) 100 UNIT/ML SOPN ADMINISTER 40 UNITS UNDER THE SKIN DAILY 15 mL 1  . Insulin Pen Needle (B-D ULTRAFINE III SHORT PEN) 31G X 8 MM MISC Use as directed up to 4 times daily to check blood sugar 100 each 9  . levalbuterol (XOPENEX HFA) 45 MCG/ACT inhaler INHALE 1 TO 2 PUFFS BY MOUTH INTO THE LUNGS EVERY 4 HOURS IF NEEDED FOR WHEEZING 15 Inhaler 5  . levalbuterol (XOPENEX) 0.63 MG/3ML nebulizer solution One vial in nebulizer four times daily as needed 360 mL 5  . montelukast (SINGULAIR) 10 MG tablet TAKE 1 TABLET BY MOUTH AT BEDTIME 30 tablet 1  . polyethylene glycol powder (GLYCOLAX/MIRALAX) powder take 17GM (DISSOLVED IN WATER) by mouth once daily THREE TIMES A WEEK if needed as directed 500 g 3  . potassium chloride SA (K-DUR,KLOR-CON) 20 MEQ tablet Take 1 tablet (20 mEq total) by mouth daily as directed. 30 tablet 11  . PRODIGY LANCETS 28G MISC 1 Units by Does not apply route 4 (four) times daily. 100 each 4  . rosuvastatin (CRESTOR) 10 MG tablet TAKE 1 TABLET(10 MG) BY MOUTH DAILY 90 tablet 1  . torsemide (DEMADEX) 20 MG tablet Take 1 tablet (20 mg total) by mouth daily. 180 tablet 1  . VICTOZA 18 MG/3ML SOPN ADMINISTER 1.8 MG UNDER THE SKIN DAILY 9 mL 3   No current facility-administered medications for this visit.    Allergies:   Avelox [moxifloxacin hcl in nacl], Nsaids, Simvastatin, Moxifloxacin, Ace inhibitors, Jardiance [empagliflozin], and Latex   Social History: Social History   Socioeconomic History  . Marital status: Widowed    Spouse name: Not on file  . Number of children: Not on file  . Years of education: Not on file  . Highest education level: Not on file  Occupational History  . Not on file  Tobacco Use  . Smoking status: Never Smoker  . Smokeless tobacco: Never Used  Substance and Sexual Activity  . Alcohol use: No  . Drug use: No  . Sexual activity: Never  Other Topics Concern  . Not on file    Social History Narrative   Not employed   Exercise- walking 1 hour daily.    Diet- eating healthy diet.       [facility]:   She is originally from [facility]. Has always lived in [place]. 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 Currently has a small dog. No bird or mold exposure.       Current Social History 04/08/2017        Patient lives with daughter, Lafonda Mosses, in two level  home 04/08/2017   Transportation: Patient has own vehicle and drives herself 04/08/2017   Important Relationships Daughter, Lafonda Mosses 04/08/2017    Pets: Shiatzu Evaristo Bury)  04/08/2017   Education / Work:  10 th grade/ None 04/08/2017   Interests / Fun: Read, do puzzles 04/08/2017   Current Stressors: None because she prays to God 04/08/2017   Religious / Personal Beliefs: Non-Denominational 04/08/2017   L. [place]atte, RN, BSN                                                                                                 Social Determinants of Health   Financial Resource Strain:   . Difficulty of Paying Living Expenses: Not on file  Food Insecurity:   . Worried About Programme researcher, broadcasting/film/video in the Last Year: Not on file  . Ran Out of Food in the Last Year: Not on file  Transportation Needs:   . Lack of Transportation (Medical): Not on file  . Lack of Transportation (Non-Medical): Not on file  Physical Activity:   . Days of Exercise per Week: Not on file  . Minutes of Exercise per Session: Not on file  Stress:   . Feeling of Stress : Not on file  Social Connections:   . Frequency of Communication with Friends and Family: Not on file  . Frequency of Social Gatherings with Friends and Family: Not on file  . Attends Religious Services: Not on file  . Active Member of Clubs or Organizations: Not on file  . Attends Banker Meetings: Not on file  . Marital Status: Not on file  Intimate Partner Violence:   .  Fear of Current or Ex-Partner: Not on file  . Emotionally Abused: Not on file  . Physically Abused: Not on file  . Sexually Abused: Not on file    Family History: Family History  Problem Relation Age of Onset  . Diabetes Father   . Hypertension Father   . Diabetes Brother   . Hypertension Brother   . Lung disease Neg Hx   . Cancer Neg Hx   . Rheumatologic disease Neg Hx     Review of Systems: All other systems reviewed and are otherwise negative except as noted above.   Physical Exam: VS:  BP 110/78   Pulse 83   Ht 5\' 6"  (1.676 m)   Wt 191 lb 3.2 oz (86.7 kg)   LMP 07/26/2011   SpO2 99%   BMI 30.86 kg/m  , BMI Body mass index is 30.86 kg/m.  GEN- The patient is well appearing, alert and oriented x 3 today.   HEENT: normocephalic, atraumatic; sclera clear, conjunctiva pink; hearing intact; oropharynx clear; neck supple  Lungs- Clear to ausculation bilaterally, normal work of breathing.  No wheezes, rales, rhonchi Heart- Regular rate and rhythm  GI- soft, non-tender, non-distended, bowel sounds present  Extremities- no clubbing, cyanosis, or edema  MS- no significant deformity or atrophy Skin- warm and dry, no rash or lesion; ICD pocket well healed Psych- euthymic mood, full affect Neuro- strength and sensation are intact  ICD interrogation- reviewed in  detail today,  See PACEART report  EKG:  EKG is ordered today. The ekg ordered today shows atrial pacing, rate 83, QTc  Recent Labs: 09/09/2018: Hemoglobin 12.8; Magnesium 2.2; Platelets 211 02/10/2019: ALT 63; BUN 15; Creatinine, Ser 1.16; Potassium 4.1; Sodium 140   Wt Readings from Last 3 Encounters:  05/04/19 191 lb 3.2 oz (86.7 kg)  02/17/19 199 lb 3.2 oz (90.4 kg)  02/10/19 200 lb 3.2 oz (90.8 kg)     Other studies Reviewed: Additional studies/ records that were reviewed today include: Dr Renaye Rakers office notes   Assessment and Plan:  1.  Chronic diastolic dysfunction euvolemic today Stable on an  appropriate medical regimen Normal ICD function See Pace Art report No changes today  2.  VT No recurrence since last office visit  3.  Paroxysmal atrial fibrillation Burden by device interrogation 2.6% She has had prior PVI at [facility] BMET, Mg, CBC today. QTc stable Continue Eliquis for CHADS2VASC of 4  4.  CKD BMET today  5.  HTN Stable No change required today   Current medicines are reviewed at length with the patient today.   The patient does not have concerns regarding her medicines.  The following changes were made today:  none  Labs/ tests ordered today include: BMET, Mg, CBC No orders of the defined types were placed in this encounter.    Disposition:   Follow up with remotes, me in 6 months      Signed, Gypsy Balsam, NP 05/04/2019 8:54 AM  [facility] [address] [place] [address] [phone] (office) [phone] (fax)

## 2019-05-04 ENCOUNTER — Ambulatory Visit: Payer: Medicare HMO | Admitting: Nurse Practitioner

## 2019-05-04 ENCOUNTER — Encounter: Payer: Self-pay | Admitting: Nurse Practitioner

## 2019-05-04 ENCOUNTER — Other Ambulatory Visit: Payer: Self-pay

## 2019-05-04 VITALS — BP 110/78 | HR 83 | Ht 66.0 in | Wt 191.2 lb

## 2019-05-04 DIAGNOSIS — I10 Essential (primary) hypertension: Secondary | ICD-10-CM | POA: Diagnosis not present

## 2019-05-04 DIAGNOSIS — G4733 Obstructive sleep apnea (adult) (pediatric): Secondary | ICD-10-CM

## 2019-05-04 DIAGNOSIS — I5032 Chronic diastolic (congestive) heart failure: Secondary | ICD-10-CM | POA: Diagnosis not present

## 2019-05-04 DIAGNOSIS — I480 Paroxysmal atrial fibrillation: Secondary | ICD-10-CM | POA: Diagnosis not present

## 2019-05-04 DIAGNOSIS — I472 Ventricular tachycardia, unspecified: Secondary | ICD-10-CM

## 2019-05-04 LAB — BASIC METABOLIC PANEL
BUN/Creatinine Ratio: 10 — ABNORMAL LOW (ref 12–28)
BUN: 12 mg/dL (ref 8–27)
CO2: 25 mmol/L (ref 20–29)
Calcium: 9.4 mg/dL (ref 8.7–10.3)
Chloride: 100 mmol/L (ref 96–106)
Creatinine, Ser: 1.23 mg/dL — ABNORMAL HIGH (ref 0.57–1.00)
GFR calc Af Amer: 52 mL/min/{1.73_m2} — ABNORMAL LOW (ref >59)
GFR calc non Af Amer: 45 mL/min/{1.73_m2} — ABNORMAL LOW (ref >59)
Glucose: 149 mg/dL — ABNORMAL HIGH (ref 65–99)
Potassium: 3.7 mmol/L (ref 3.5–5.2)
Sodium: 141 mmol/L (ref 134–144)

## 2019-05-04 LAB — CBC
Hematocrit: 40 % (ref 34.0–46.6)
Hemoglobin: 13.5 g/dL (ref 11.1–15.9)
MCH: 29.4 pg (ref 26.6–33.0)
MCHC: 33.8 g/dL (ref 31.5–35.7)
MCV: 87 fL (ref 79–97)
Platelets: 240 10*3/uL (ref 150–450)
RBC: 4.59 x10E6/uL (ref 3.77–5.28)
RDW: 12.6 % (ref 11.7–15.4)
WBC: 6.1 10*3/uL (ref 3.4–10.8)

## 2019-05-04 LAB — MAGNESIUM: Magnesium: 2.1 mg/dL (ref 1.6–2.3)

## 2019-05-04 NOTE — Patient Instructions (Addendum)
Medication Instructions:  none *If you need a refill on your cardiac medications before your next appointment, please call your pharmacy*  Lab Work:TODAY CBC BMET MAGNESIUM If you have labs (blood work) drawn today and your tests are completely normal, you will receive your results only by: Marland Kitchen MyChart Message (if you have MyChart) OR . A paper copy in the mail If you have any lab test that is abnormal or we need to change your treatment, we will call you to review the results.  Testing/Procedures: none  Follow-Up: At [facility], you and your health needs are our priority.  As part of our continuing mission to provide you with exceptional heart care, we have created designated Provider Care Teams.  These Care Teams include your primary Cardiologist (physician) and Advanced Practice Providers (APPs -  Physician Assistants and Nurse Practitioners) who all work together to provide you with the care you need, when you need it.  Your next appointment:   6 MONTHS  The format for your next appointment:   In Person  Provider:   Gypsy Balsam, NP   Other Instructions Remote monitoring is used to monitor your  ICD from home. This monitoring reduces the number of office visits required to check your device to one time per year. It allows [place] to keep an eye on the functioning of your device to ensure it is working properly. You are scheduled for a device check from home on 05/24/19. You may send your transmission at any time that day. If you have a wireless device, the transmission will be sent automatically. After your physician reviews your transmission, you will receive a postcard with your next transmission date.

## 2019-05-05 ENCOUNTER — Other Ambulatory Visit: Payer: Self-pay | Admitting: Pulmonary Disease

## 2019-05-05 ENCOUNTER — Telehealth: Payer: Self-pay

## 2019-05-05 LAB — CUP PACEART INCLINIC DEVICE CHECK
Date Time Interrogation Session: 20210121133316
Implantable Lead Implant Date: 20120120
Implantable Lead Implant Date: 20120120
Implantable Lead Location: 753859
Implantable Lead Location: 753860
Implantable Lead Model: 5076
Implantable Lead Model: 6935
Implantable Pulse Generator Implant Date: 20190618

## 2019-05-05 NOTE — Telephone Encounter (Signed)
The patient has been notified of the lab result and verbalized understanding.  All questions (if any) were answered. Sigurd Sos, RN 05/05/2019 8:07 AM

## 2019-05-05 NOTE — Telephone Encounter (Signed)
-----   Message from Marily Lente, NP sent at 05/05/2019  6:24 AM EST ----- Please notify patient of stable labs. Thanks!

## 2019-05-08 ENCOUNTER — Other Ambulatory Visit: Payer: Self-pay

## 2019-05-09 ENCOUNTER — Other Ambulatory Visit: Payer: Self-pay

## 2019-05-09 MED ORDER — ROSUVASTATIN CALCIUM 10 MG PO TABS: ORAL_TABLET | ORAL | 1 refills | Status: DC

## 2019-05-16 ENCOUNTER — Other Ambulatory Visit: Payer: Self-pay | Admitting: Family Medicine

## 2019-05-16 DIAGNOSIS — IMO0002 Reserved for concepts with insufficient information to code with codable children: Secondary | ICD-10-CM

## 2019-05-16 DIAGNOSIS — E1140 Type 2 diabetes mellitus with diabetic neuropathy, unspecified: Secondary | ICD-10-CM

## 2019-05-22 DIAGNOSIS — J449 Chronic obstructive pulmonary disease, unspecified: Secondary | ICD-10-CM | POA: Diagnosis not present

## 2019-05-22 DIAGNOSIS — J45909 Unspecified asthma, uncomplicated: Secondary | ICD-10-CM | POA: Diagnosis not present

## 2019-05-24 ENCOUNTER — Ambulatory Visit (INDEPENDENT_AMBULATORY_CARE_PROVIDER_SITE_OTHER): Payer: Medicare HMO | Admitting: *Deleted

## 2019-05-24 DIAGNOSIS — I5032 Chronic diastolic (congestive) heart failure: Secondary | ICD-10-CM

## 2019-05-24 LAB — CUP PACEART REMOTE DEVICE CHECK
Battery Remaining Longevity: 96 mo
Battery Voltage: 2.99 V
Brady Statistic AP VP Percent: 1.65 %
Brady Statistic AP VS Percent: 93.96 %
Brady Statistic AS VP Percent: 0.04 %
Brady Statistic AS VS Percent: 4.34 %
Brady Statistic RA Percent Paced: 93.15 %
Brady Statistic RV Percent Paced: 1.72 %
Date Time Interrogation Session: 20210210022603
HighPow Impedance: 83 Ohm
Implantable Lead Implant Date: 20120120
Implantable Lead Implant Date: 20120120
Implantable Lead Location: 753859
Implantable Lead Location: 753860
Implantable Lead Model: 5076
Implantable Lead Model: 6935
Implantable Pulse Generator Implant Date: 20190618
Lead Channel Impedance Value: 380 Ohm
Lead Channel Impedance Value: 456 Ohm
Lead Channel Impedance Value: 494 Ohm
Lead Channel Pacing Threshold Amplitude: 0.5 V
Lead Channel Pacing Threshold Amplitude: 0.75 V
Lead Channel Pacing Threshold Pulse Width: 0.4 ms
Lead Channel Pacing Threshold Pulse Width: 0.4 ms
Lead Channel Sensing Intrinsic Amplitude: 0.25 mV
Lead Channel Sensing Intrinsic Amplitude: 0.25 mV
Lead Channel Sensing Intrinsic Amplitude: 17.125 mV
Lead Channel Sensing Intrinsic Amplitude: 17.125 mV
Lead Channel Setting Pacing Amplitude: 2 V
Lead Channel Setting Pacing Amplitude: 2.5 V
Lead Channel Setting Pacing Pulse Width: 0.4 ms
Lead Channel Setting Sensing Sensitivity: 0.3 mV

## 2019-05-24 NOTE — Progress Notes (Signed)
ICD Remote  

## 2019-05-25 ENCOUNTER — Ambulatory Visit (INDEPENDENT_AMBULATORY_CARE_PROVIDER_SITE_OTHER): Payer: Medicare HMO

## 2019-05-25 DIAGNOSIS — Z95810 Presence of automatic (implantable) cardiac defibrillator: Secondary | ICD-10-CM

## 2019-05-25 DIAGNOSIS — E261 Secondary hyperaldosteronism: Secondary | ICD-10-CM | POA: Diagnosis not present

## 2019-05-25 DIAGNOSIS — E669 Obesity, unspecified: Secondary | ICD-10-CM | POA: Diagnosis not present

## 2019-05-25 DIAGNOSIS — E785 Hyperlipidemia, unspecified: Secondary | ICD-10-CM | POA: Diagnosis not present

## 2019-05-25 DIAGNOSIS — E1165 Type 2 diabetes mellitus with hyperglycemia: Secondary | ICD-10-CM | POA: Diagnosis not present

## 2019-05-25 DIAGNOSIS — I509 Heart failure, unspecified: Secondary | ICD-10-CM | POA: Diagnosis not present

## 2019-05-25 DIAGNOSIS — I5032 Chronic diastolic (congestive) heart failure: Secondary | ICD-10-CM

## 2019-05-25 DIAGNOSIS — Z794 Long term (current) use of insulin: Secondary | ICD-10-CM | POA: Diagnosis not present

## 2019-05-25 DIAGNOSIS — I4891 Unspecified atrial fibrillation: Secondary | ICD-10-CM | POA: Diagnosis not present

## 2019-05-25 DIAGNOSIS — G8929 Other chronic pain: Secondary | ICD-10-CM | POA: Diagnosis not present

## 2019-05-25 DIAGNOSIS — R69 Illness, unspecified: Secondary | ICD-10-CM | POA: Diagnosis not present

## 2019-05-26 NOTE — Progress Notes (Signed)
EPIC Encounter for ICM Monitoring  Patient Name: [name] is a 69 y.o. female Date: 05/26/2019 Primary Care Physican: Unknown [name], DO Primary Cardiologist:Croitoru Electrophysiologist:Croitoru 05/26/2019 Weight: 188lbs  Clinical Status (04-May-2019 to 24-May-2019)  AT/AF  185 episodes   Time in AT/AF  0.6 hr/day (2.5%)    Transmission reviewedand spoke with patient. She denies any fluid symptoms.  Optivol thoracic impedancenormal.  Prescribed dosage:Torsemide 20 mg 1 tablet daily. Potassium 20 mEq 1 tabletby mouth as directed.  Labs: 11/16/2018 Creatinine 1.18, BUN 19, Potassium 4.2, Sodium 141, GFR 48-55 11/01/2018 Creatinine 1.34, BUN 19, Potassium 4.2, Sodium 142, GFR 41-47 09/09/2018 Creatinine1.08, BUN13, Potassium4.2, Sodium140, GFR53-61 05/23/2018 Creatinine1.17, BUN17, Potassium4.5, Sodium141, ZRA07-62 A complete set of results can be found in Results Review.  Recommendations: No changes and encouraged to call if experiencing any fluid symptoms.  Follow-up plan: ICM clinic phone appointment on 06/26/2019.   91 day device clinic remote transmission 5//03/2020.      Copy of ICM check sent to Dr. Royann Shivers.   3 month ICM trend: 05/25/2019    1 Year ICM trend:       [name], RN 05/26/2019 2:05 PM

## 2019-05-26 NOTE — Progress Notes (Signed)
Great, thanks

## 2019-06-05 DIAGNOSIS — E119 Type 2 diabetes mellitus without complications: Secondary | ICD-10-CM | POA: Diagnosis not present

## 2019-06-13 ENCOUNTER — Ambulatory Visit: Payer: Medicare HMO | Admitting: Sports Medicine

## 2019-06-13 ENCOUNTER — Other Ambulatory Visit: Payer: Self-pay

## 2019-06-13 ENCOUNTER — Encounter: Payer: Self-pay | Admitting: Sports Medicine

## 2019-06-13 DIAGNOSIS — E0842 Diabetes mellitus due to underlying condition with diabetic polyneuropathy: Secondary | ICD-10-CM

## 2019-06-13 DIAGNOSIS — M79676 Pain in unspecified toe(s): Secondary | ICD-10-CM | POA: Diagnosis not present

## 2019-06-13 DIAGNOSIS — L84 Corns and callosities: Secondary | ICD-10-CM | POA: Diagnosis not present

## 2019-06-13 DIAGNOSIS — B351 Tinea unguium: Secondary | ICD-10-CM | POA: Diagnosis not present

## 2019-06-13 NOTE — Progress Notes (Signed)
Subjective: [name] is a 69 y.o. female patient with history of diabetes who presents to office today complaining of long,mildly painful nails  while ambulating in shoes; unable to trim. Patient states that the glucose reading this morning was 106 still on Victoza. No new issues besides will be getting knee surgery on right which has now been postponed to next month.   Patient Active Problem List   Diagnosis Date Noted  . Iliotibial band syndrome, left 11/01/2018  . GERD (gastroesophageal reflux disease) 03/23/2018  . Chronic diastolic heart failure (HCC) 01/07/2018  . Mild obesity 01/07/2018  . Arthritis of carpometacarpal (CMC) joint of thumb 03/26/2016  . Primary localized osteoarthritis of right knee   . Visit for monitoring Tikosyn therapy 08/21/2015  . Long term current use of anticoagulant therapy 08/21/2015  . Ventricular tachycardia (HCC) 05/22/2015  . Obesity (BMI 30-39.9) 01/04/2014  . ICD (St. Jude Protecta dual-chamber),secondary prevention (VF arrest) January 2012 11/19/2012  . CKD (chronic kidney disease) stage 2, GFR 60-89 ml/min 02/22/2012  . Asthma, moderate persistent 12/10/2011  . Diabetic neuropathy (HCC) 09/18/2011  . Osteoarthritis of right knee 08/28/2008  . Type 2 diabetes, uncontrolled, with neuropathy (HCC) 06/10/2006  . HYPERCHOLESTEROLEMIA 06/10/2006  . Essential hypertension 06/10/2006  . OSA on CPAP 06/10/2006   Current Outpatient Medications on File Prior to Visit  Medication Sig Dispense Refill  . apixaban (ELIQUIS) 5 MG TABS tablet Take 1 tablet (5 mg total) by mouth 2 (two) times daily. 180 tablet 1  . bisoprolol (ZEBETA) 10 MG tablet Take 1 tablet (10 mg total) by mouth 2 (two) times daily. 180 tablet 2  . budesonide-formoterol (SYMBICORT) 160-4.5 MCG/ACT inhaler Inhale 2 puffs into the lungs 2 (two) times daily as needed. 1 Inhaler 3  . buPROPion (WELLBUTRIN XL) 150 MG 24 hr tablet Take 1 tablet (150 mg total) by mouth daily. 90 tablet 3  .  canagliflozin (INVOKANA) 100 MG TABS tablet Take 1 tablet (100 mg total) by mouth daily. 90 tablet 3  . dofetilide (TIKOSYN) 250 MCG capsule Take 1 capsule (250 mcg total) by mouth 2 (two) times daily. 180 capsule 3  . famotidine (PEPCID) 20 MG tablet Take 1 tablet (20 mg total) by mouth daily. 90 tablet 3  . fluticasone (FLONASE) 50 MCG/ACT nasal spray instill 1 spray into each nostril twice a day 16 g 5  . glucose blood test strip Use as instructed 100 each 12  . insulin aspart (NOVOLOG FLEXPEN) 100 UNIT/ML FlexPen Inject 5-8 Units into the skin See admin instructions. Inject 5-8 units SQ before supper 15 mL 3  . Insulin Glargine (BASAGLAR KWIKPEN) 100 UNIT/ML SOPN ADMINISTER 40 UNITS UNDER THE SKIN DAILY 15 mL 1  . Insulin Pen Needle (B-D ULTRAFINE III SHORT PEN) 31G X 8 MM MISC Use as directed up to 4 times daily to check blood sugar 100 each 9  . levalbuterol (XOPENEX HFA) 45 MCG/ACT inhaler INHALE 1 TO 2 PUFFS BY MOUTH INTO THE LUNGS EVERY 4 HOURS IF NEEDED FOR WHEEZING 15 Inhaler 5  . levalbuterol (XOPENEX) 0.63 MG/3ML nebulizer solution One vial in nebulizer four times daily as needed 360 mL 5  . montelukast (SINGULAIR) 10 MG tablet TAKE 1 TABLET BY MOUTH AT BEDTIME 90 tablet 0  . polyethylene glycol powder (GLYCOLAX/MIRALAX) powder take 17GM (DISSOLVED IN WATER) by mouth once daily THREE TIMES A WEEK if needed as directed 500 g 3  . potassium chloride SA (K-DUR,KLOR-CON) 20 MEQ tablet Take 1 tablet (20 mEq  total) by mouth daily as directed. 30 tablet 11  . PRODIGY LANCETS 28G MISC 1 Units by Does not apply route 4 (four) times daily. 100 each 4  . rosuvastatin (CRESTOR) 10 MG tablet TAKE 1 TABLET(10 MG) BY MOUTH DAILY 90 tablet 1  . torsemide (DEMADEX) 20 MG tablet Take 1 tablet (20 mg total) by mouth daily. 180 tablet 1  . VICTOZA 18 MG/3ML SOPN ADMINISTER 1.8 MG UNDER THE SKIN DAILY 9 mL 3   No current facility-administered medications on file prior to visit.   Allergies  Allergen  Reactions  . Avelox [Moxifloxacin Hcl In Nacl] Other (See Comments)    Cardiac arrest per pt  . Nsaids Other (See Comments)    Cardiac arrest per pt Cardiac arrest per pt  . Simvastatin Other (See Comments)    myalgias myalgias myalgias  . Moxifloxacin Other (See Comments)  . Ace Inhibitors Other (See Comments) and Cough    Dry cough  Dry cough  Dry cough   . Jardiance [Empagliflozin] Other (See Comments)    Felt "crazy", fatigue, sweating, denies hypoglycemia while taking  . Latex Rash    Recent Results (from the past 2160 hour(s))  Basic Metabolic Panel (BMET)     Status: Abnormal   Collection Time: 05/04/19  9:09 AM  Result Value Ref Range   Glucose 149 (H) 65 - 99 mg/dL   BUN 12 8 - 27 mg/dL   Creatinine, Ser 2.72 (H) 0.57 - 1.00 mg/dL   GFR calc non Af Amer 45 (L) >59 mL/min/1.73   GFR calc Af Amer 52 (L) >59 mL/min/1.73   BUN/Creatinine Ratio 10 (L) 12 - 28   Sodium 141 134 - 144 mmol/L   Potassium 3.7 3.5 - 5.2 mmol/L   Chloride 100 96 - 106 mmol/L   CO2 25 20 - 29 mmol/L   Calcium 9.4 8.7 - 10.3 mg/dL  Magnesium     Status: None   Collection Time: 05/04/19  9:09 AM  Result Value Ref Range   Magnesium 2.1 1.6 - 2.3 mg/dL  CBC     Status: None   Collection Time: 05/04/19  9:09 AM  Result Value Ref Range   WBC 6.1 3.4 - 10.8 x10E3/uL   RBC 4.59 3.77 - 5.28 x10E6/uL   Hemoglobin 13.5 11.1 - 15.9 g/dL   Hematocrit 53.6 64.4 - 46.6 %   MCV 87 79 - 97 fL   MCH 29.4 26.6 - 33.0 pg   MCHC 33.8 31.5 - 35.7 g/dL   RDW 03.4 74.2 - 59.5 %   Platelets 240 150 - 450 x10E3/uL  CUP PACEART INCLINIC DEVICE CHECK     Status: None   Collection Time: 05/04/19  1:33 PM  Result Value Ref Range   Pulse Generator Manufacturer MERM    Date Time Interrogation Session [phone]    Pulse Gen Model DDMB1D1 Evera MRI XT DR    Pulse Gen Serial Number [id number]    Clinic Name [facility]    Implantable Pulse Generator Type Implantable Cardiac Defibulator    Implantable  Pulse Generator Implant Date [id number]    Implantable Lead Manufacturer [facility]    Implantable Lead Model 5076 CapSureFix Novus    Implantable Lead Serial Number [id number]    Implantable Lead Implant Date 35573220    Implantable Lead Location [id number]    Implantable Lead Manufacturer [facility]    Implantable Lead Model 6935 Sprint Quattro Secure S    Implantable Lead Serial Number [id number]  Implantable Lead Implant Date 28003491    Implantable Lead Location [id number]   CUP PACEART REMOTE DEVICE CHECK     Status: None   Collection Time: 05/24/19  2:26 AM  Result Value Ref Range   Date Time Interrogation Session [id number]    Pulse Generator Manufacturer MERM    Pulse Gen Model DDMB1D1 Evera MRI XT DR    Pulse Gen Serial Number [id number]    Clinic Name [facility]    Implantable Pulse Generator Type Implantable Cardiac Defibulator    Implantable Pulse Generator Implant Date [id number]    Implantable Lead Manufacturer [facility]    Implantable Lead Model 5076 CapSureFix Novus    Implantable Lead Serial Number [id number]    Implantable Lead Implant Date 07867544    Implantable Lead Location [id number]    Implantable Lead Manufacturer [facility]    Implantable Lead Model 6935 Sprint Quattro Secure S    Implantable Lead Serial Number [id number]    Implantable Lead Implant Date 92010071    Implantable Lead Location [id number]    Lead Channel Setting Sensing Sensitivity 0.3 mV   Lead Channel Setting Pacing Amplitude 2 V   Lead Channel Setting Pacing Pulse Width 0.4 ms   Lead Channel Setting Pacing Amplitude 2.5 V   Lead Channel Impedance Value 494 ohm   Lead Channel Sensing Intrinsic Amplitude 0.25 mV   Lead Channel Sensing Intrinsic Amplitude 0.25 mV   Lead Channel Pacing Threshold Amplitude 0.5 V   Lead Channel Pacing Threshold Pulse Width 0.4 ms   Lead Channel Impedance Value 456 ohm   Lead Channel Impedance Value 380 ohm   Lead Channel Sensing Intrinsic Amplitude 17.125 mV   Lead Channel Sensing  Intrinsic Amplitude 17.125 mV   Lead Channel Pacing Threshold Amplitude 0.75 V   Lead Channel Pacing Threshold Pulse Width 0.4 ms   HighPow Impedance 83 ohm   Battery Status OK    Battery Remaining Longevity 96 mo   Battery Voltage 2.99 V   Brady Statistic RA Percent Paced 93.15 %   Brady Statistic RV Percent Paced 1.72 %   Brady Statistic AP VP Percent 1.65 %   Brady Statistic AS VP Percent 0.04 %   Brady Statistic AP VS Percent 93.96 %   Brady Statistic AS VS Percent 4.34 %    Objective: General: Patient is awake, alert, and oriented x 3 and in no acute distress.  Integument: Skin is warm, dry and supple bilateral. Nails are tender, long, thickened and dystrophic with subungual debris, consistent with onychomycosis, 1-5 bilateral. No signs of infection. + callus hallux, sub 3-4, and 5th toes bilateral. Remaining integument unremarkable.  Vasculature:  Dorsalis Pedis pulse 1/4 bilateral. Posterior Tibial pulse  1/4 bilateral. Capillary fill time <3 sec 1-5 bilateral. Scant hair growth to the level of the digits.Temperature gradient within normal limits. No varicosities present bilateral. No edema present bilateral.   Neurology: The patient has intact sensation measured with a 5.07/10g Semmes Weinstein Monofilament at all pedal sites bilateral . Vibratory sensation diminished bilateral with tuning fork. No Babinski sign present bilateral.   Musculoskeletal:  Asymptomatic pes planus, bunion and hammertoe pedal deformities noted bilateral. Muscular strength 5/5 in all lower extremity muscular groups bilateral without pain on range of motion . No tenderness with calf compression bilateral.  Assessment and Plan: Problem List Items Addressed This Visit      Endocrine   Diabetic neuropathy (HCC) (Chronic)    Other Visit Diagnoses    Pain due to onychomycosis of nail    -  Primary   Corns and callosities         -Examined patient. -Re-discussed and educated patient on diabetic foot  care, especially with regards to the vascular, neurological and musculoskeletal systems.  -Mechanically debrided callus >5 using sterile chisel blade and debrided all nails 1-5 bilateral using sterile nail nipper and filed with dremel without incident  -Continue with daily skin emollients for callus areas -Patient to return  in 3 months for at risk foot care -Patient advised to call the office if any problems or questions arise in the meantime.  Landis Martins, DPM

## 2019-06-19 DIAGNOSIS — J449 Chronic obstructive pulmonary disease, unspecified: Secondary | ICD-10-CM | POA: Diagnosis not present

## 2019-06-19 DIAGNOSIS — J45909 Unspecified asthma, uncomplicated: Secondary | ICD-10-CM | POA: Diagnosis not present

## 2019-06-26 DIAGNOSIS — R69 Illness, unspecified: Secondary | ICD-10-CM | POA: Diagnosis not present

## 2019-06-29 ENCOUNTER — Encounter: Payer: Medicare HMO | Admitting: Nurse Practitioner

## 2019-06-30 NOTE — Progress Notes (Signed)
No ICM remote transmission received for 06/26/2019 and next ICM transmission scheduled for 07/24/2019.   

## 2019-07-12 ENCOUNTER — Other Ambulatory Visit: Payer: Self-pay | Admitting: *Deleted

## 2019-07-12 DIAGNOSIS — IMO0002 Reserved for concepts with insufficient information to code with codable children: Secondary | ICD-10-CM

## 2019-07-12 DIAGNOSIS — E1140 Type 2 diabetes mellitus with diabetic neuropathy, unspecified: Secondary | ICD-10-CM

## 2019-07-12 MED ORDER — VICTOZA 18 MG/3ML ~~LOC~~ SOPN: 1.8000 mg | PEN_INJECTOR | Freq: Every day | SUBCUTANEOUS | 3 refills | Status: DC

## 2019-07-14 ENCOUNTER — Telehealth: Payer: Self-pay | Admitting: Cardiovascular Disease

## 2019-07-14 MED ORDER — DOFETILIDE 250 MCG PO CAPS: 250.0000 ug | ORAL_CAPSULE | Freq: Two times a day (BID) | ORAL | 1 refills | Status: DC

## 2019-07-14 NOTE — Telephone Encounter (Signed)
New message      *STAT* If patient is at the pharmacy, call can be transferred to refill team.   1. Which medications need to be refilled? (please list name of each medication and dose if known)  Dofetilide 250mg   2. Which pharmacy/location (including street and city if local pharmacy) is medication to be sent to? [facility] on [address]  3. Do they need a 30 day or 90 day supply?  90 day supply please

## 2019-07-20 DIAGNOSIS — J45909 Unspecified asthma, uncomplicated: Secondary | ICD-10-CM | POA: Diagnosis not present

## 2019-07-20 DIAGNOSIS — J449 Chronic obstructive pulmonary disease, unspecified: Secondary | ICD-10-CM | POA: Diagnosis not present

## 2019-07-24 ENCOUNTER — Ambulatory Visit (INDEPENDENT_AMBULATORY_CARE_PROVIDER_SITE_OTHER): Payer: Medicare HMO

## 2019-07-24 DIAGNOSIS — I5032 Chronic diastolic (congestive) heart failure: Secondary | ICD-10-CM | POA: Diagnosis not present

## 2019-07-24 DIAGNOSIS — Z95810 Presence of automatic (implantable) cardiac defibrillator: Secondary | ICD-10-CM | POA: Diagnosis not present

## 2019-07-25 ENCOUNTER — Telehealth: Payer: Self-pay

## 2019-07-25 NOTE — Telephone Encounter (Signed)
Remote ICM transmission received.  Attempted call to patient regarding ICM remote transmission and left detailed message per DPR.  Advised to return call for any fluid symptoms or questions. Next ICM remote transmission scheduled 08/28/2019.     

## 2019-07-25 NOTE — Progress Notes (Signed)
EPIC Encounter for ICM Monitoring  Patient Name: [name] is a 69 y.o. female Date: 07/25/2019 Primary Care Physican: Unknown [name], DO Primary Cardiologist:Croitoru Electrophysiologist:Croitoru 05/26/2019 Weight: 188lbs  Clinical Status (25-May-2019 to 24-Jul-2019)  AT/AF             123 episodes   Time in AT/AF 0.2 hr/day (0.9%)  Longest AT/AF 2 hours   Attempted call to patient and unable to reach.  Left detailed message per DPR regarding transmission. Transmission reviewed.   Optivol thoracic impedancenormal.  Prescribed dosage:Torsemide 20 mg 1 tablet daily. Potassium 20 mEq 1 tabletby mouth as directed.  Labs: 05/04/2019 Creatinine 1.23, BUN 12, Potassium 3.7, Sodium 141, GFR 45-52 A complete set of results can be found in Results Review.  Recommendations: Left voice mail with ICM number and encouraged to call if experiencing any fluid symptoms.  Follow-up plan: ICM clinic phone appointment on 08/28/2019.   91 day device clinic remote transmission 08/23/2019.      Copy of ICM check sent to Dr. Royann Shivers.  3 month ICM trend: 07/24/2019    1 Year ICM trend:       [name], RN 07/25/2019 9:45 AM

## 2019-07-26 ENCOUNTER — Other Ambulatory Visit: Payer: Self-pay

## 2019-07-26 DIAGNOSIS — E1140 Type 2 diabetes mellitus with diabetic neuropathy, unspecified: Secondary | ICD-10-CM

## 2019-07-26 DIAGNOSIS — IMO0002 Reserved for concepts with insufficient information to code with codable children: Secondary | ICD-10-CM

## 2019-07-26 MED ORDER — BASAGLAR KWIKPEN 100 UNIT/ML ~~LOC~~ SOPN: PEN_INJECTOR | SUBCUTANEOUS | 1 refills | Status: DC

## 2019-08-01 ENCOUNTER — Encounter: Payer: Self-pay | Admitting: Family Medicine

## 2019-08-01 ENCOUNTER — Ambulatory Visit (INDEPENDENT_AMBULATORY_CARE_PROVIDER_SITE_OTHER): Payer: Medicare HMO | Admitting: Family Medicine

## 2019-08-01 ENCOUNTER — Other Ambulatory Visit: Payer: Self-pay

## 2019-08-01 VITALS — BP 104/68 | HR 89 | Ht 66.0 in | Wt 192.0 lb

## 2019-08-01 DIAGNOSIS — E1165 Type 2 diabetes mellitus with hyperglycemia: Secondary | ICD-10-CM | POA: Diagnosis not present

## 2019-08-01 DIAGNOSIS — E1140 Type 2 diabetes mellitus with diabetic neuropathy, unspecified: Secondary | ICD-10-CM

## 2019-08-01 DIAGNOSIS — I10 Essential (primary) hypertension: Secondary | ICD-10-CM

## 2019-08-01 DIAGNOSIS — IMO0002 Reserved for concepts with insufficient information to code with codable children: Secondary | ICD-10-CM

## 2019-08-01 LAB — POCT GLYCOSYLATED HEMOGLOBIN (HGB A1C): HbA1c, POC (controlled diabetic range): 7.9 % — AB (ref 0.0–7.0)

## 2019-08-01 MED ORDER — BASAGLAR KWIKPEN 100 UNIT/ML ~~LOC~~ SOPN: PEN_INJECTOR | SUBCUTANEOUS | 1 refills | Status: DC

## 2019-08-01 NOTE — Assessment & Plan Note (Signed)
Very well controlled on her current regimen.  Last labs were in January and within normal limits.  Continue with current regimen.

## 2019-08-01 NOTE — Progress Notes (Signed)
    SUBJECTIVE:   CHIEF COMPLAINT / HPI:   T2DM Currently on Invokana, Victoza, Basaglar 40 units QAM, SSI when needed Has been tolerating regimen well CBG 135 this AM, usually lower than that in the AM At night, CBGs are usually in the 200s Last seen by podiatry in March 2021 No hypoglycemia, polyuria, polydipsia Eating well and trynig to stay active Working to decrease A1c to 7.5 that she can proceed with her knee replacement, this was previously canceled for [employer] with eye doctor and podiatrist, reports being up-to-date with both  HTN Currently on bisoprolol and torsemide Last seen by cardiology on 05/25/2019 Denies any chest pain or shortness of breath, no other complaints.  PERTINENT  PMH / PSH: Chronic diastolic heart failure, follows with cardiology, ICD in place  OBJECTIVE:   BP 104/68   Pulse 89   Ht 5\' 6"  (1.676 m)   Wt 192 lb (87.1 kg)   LMP 07/26/2011   SpO2 99%   BMI 30.99 kg/m    Physical Exam:  General: 69 y.o. female in NAD Cardio: RRR no m/r/g Lungs: CTAB, no wheezing, no rhonchi, no crackles, no IWOB on RA Skin: warm and dry Extremities: No edema   ASSESSMENT/PLAN:   Type 2 diabetes, uncontrolled, with neuropathy (HCC) Patient's A1c increased from 7.5-7.9.  7.9 would be acceptable, but patient's goal is to be 7.5 or less given her need for a right knee replacement.  This also significantly limits her ability to exercise, which would overall improve her health.  In order to reach this goal, encourage patient to continue with her exercise as able as well as diet.  We will increase her Basaglar to 44 units daily, patient advised if she has low sugars, to decrease back to 40 units and to call the office.  Increase to 44 units should help improve control given that her sugars at the end of the day are usually in the 200s and she takes her Basaglar in the morning.  We will have her come back in 1 month to reassess.  She is on a statin, has been  intolerant to ACE/ARB in the past.  Essential hypertension Very well controlled on her current regimen.  Last labs were in January and within normal limits.  Continue with current regimen.     [place], DO [facility]

## 2019-08-01 NOTE — Patient Instructions (Signed)
Thank you for coming to see me today. It was a pleasure. Today we talked about:   Your A1c was 7.9 today.  Since you need an A1c of 7.5 for surgery, we will increase your basaglar to 44 units daily.  If your sugars get too low, go back to 40 units.  Please follow-up with me in 6 weeks.  If you have any questions or concerns, please do not hesitate to call the office at [phone].  Best,   Luis Abed, DO

## 2019-08-01 NOTE — Assessment & Plan Note (Addendum)
Patient's A1c increased from 7.5-7.9.  7.9 would be acceptable, but patient's goal is to be 7.5 or less given her need for a right knee replacement.  This also significantly limits her ability to exercise, which would overall improve her health.  In order to reach this goal, encourage patient to continue with her exercise as able as well as diet.  We will increase her Basaglar to 44 units daily, patient advised if she has low sugars, to decrease back to 40 units and to call the office.  Increase to 44 units should help improve control given that her sugars at the end of the day are usually in the 200s and she takes her Basaglar in the morning.  We will have her come back in 1 month to reassess.  She is on a statin, has been intolerant to ACE/ARB in the past.

## 2019-08-02 ENCOUNTER — Other Ambulatory Visit: Payer: Self-pay | Admitting: Pulmonary Disease

## 2019-08-02 ENCOUNTER — Other Ambulatory Visit: Payer: Self-pay | Admitting: Cardiovascular Disease

## 2019-08-02 NOTE — Telephone Encounter (Signed)
New Message ° ° ° °*STAT* If patient is at the pharmacy, call can be transferred to refill team. ° ° °1. Which medications need to be refilled? (please list name of each medication and dose if known) apixaban (ELIQUIS) 5 MG TABS tablet  ° ° °2. Which pharmacy/location (including street and city if local pharmacy) is medication to be sent to?[id number] - [place], [place] - [address] AT [address] ° °3. Do they need a 30 day or 90 day supply? 90 ° °

## 2019-08-03 MED ORDER — APIXABAN 5 MG PO TABS: 5.0000 mg | ORAL_TABLET | Freq: Two times a day (BID) | ORAL | 1 refills | Status: DC

## 2019-08-03 NOTE — Telephone Encounter (Signed)
*  STAT* If patient is at the pharmacy, call can be transferred to refill team.   1. Which medications need to be refilled? (please list name of each medication and dose if known) apixaban (ELIQUIS) 5 MG TABS tablet  2. Which pharmacy/location (including street and city if local pharmacy) is medication to be sent to? [id number] - [place], [place] - [address] AT [facility] OF [address]  3. Do they need a 30 day or 90 day supply? 90  Patient is almost out of medication.

## 2019-08-14 DIAGNOSIS — J45909 Unspecified asthma, uncomplicated: Secondary | ICD-10-CM | POA: Diagnosis not present

## 2019-08-14 DIAGNOSIS — J449 Chronic obstructive pulmonary disease, unspecified: Secondary | ICD-10-CM | POA: Diagnosis not present

## 2019-08-23 ENCOUNTER — Ambulatory Visit (INDEPENDENT_AMBULATORY_CARE_PROVIDER_SITE_OTHER): Payer: Medicare HMO | Admitting: *Deleted

## 2019-08-23 DIAGNOSIS — I472 Ventricular tachycardia, unspecified: Secondary | ICD-10-CM

## 2019-08-23 DIAGNOSIS — I5032 Chronic diastolic (congestive) heart failure: Secondary | ICD-10-CM

## 2019-08-23 LAB — CUP PACEART REMOTE DEVICE CHECK
Battery Remaining Longevity: 91 mo
Battery Voltage: 2.99 V
Brady Statistic AP VP Percent: 0.33 %
Brady Statistic AP VS Percent: 96.37 %
Brady Statistic AS VP Percent: 0.03 %
Brady Statistic AS VS Percent: 3.27 %
Brady Statistic RA Percent Paced: 95.59 %
Brady Statistic RV Percent Paced: 0.42 %
Date Time Interrogation Session: 20210512012202
HighPow Impedance: 75 Ohm
Implantable Lead Implant Date: 20120120
Implantable Lead Implant Date: 20120120
Implantable Lead Location: 753859
Implantable Lead Location: 753860
Implantable Lead Model: 5076
Implantable Lead Model: 6935
Implantable Pulse Generator Implant Date: 20190618
Lead Channel Impedance Value: 342 Ohm
Lead Channel Impedance Value: 437 Ohm
Lead Channel Impedance Value: 456 Ohm
Lead Channel Pacing Threshold Amplitude: 0.5 V
Lead Channel Pacing Threshold Amplitude: 0.75 V
Lead Channel Pacing Threshold Pulse Width: 0.4 ms
Lead Channel Pacing Threshold Pulse Width: 0.4 ms
Lead Channel Sensing Intrinsic Amplitude: 1.125 mV
Lead Channel Sensing Intrinsic Amplitude: 1.125 mV
Lead Channel Sensing Intrinsic Amplitude: 13.5 mV
Lead Channel Sensing Intrinsic Amplitude: 13.5 mV
Lead Channel Setting Pacing Amplitude: 2 V
Lead Channel Setting Pacing Amplitude: 2.5 V
Lead Channel Setting Pacing Pulse Width: 0.4 ms
Lead Channel Setting Sensing Sensitivity: 0.3 mV

## 2019-08-24 NOTE — Progress Notes (Signed)
Remote ICD transmission.   

## 2019-08-28 ENCOUNTER — Ambulatory Visit: Payer: Medicare HMO

## 2019-08-28 DIAGNOSIS — I5032 Chronic diastolic (congestive) heart failure: Secondary | ICD-10-CM

## 2019-08-28 DIAGNOSIS — Z95810 Presence of automatic (implantable) cardiac defibrillator: Secondary | ICD-10-CM

## 2019-08-29 ENCOUNTER — Ambulatory Visit: Payer: Medicare HMO | Admitting: Pulmonary Disease

## 2019-08-29 ENCOUNTER — Other Ambulatory Visit: Payer: Self-pay

## 2019-08-29 ENCOUNTER — Encounter: Payer: Self-pay | Admitting: Pulmonary Disease

## 2019-08-29 DIAGNOSIS — G4733 Obstructive sleep apnea (adult) (pediatric): Secondary | ICD-10-CM | POA: Diagnosis not present

## 2019-08-29 DIAGNOSIS — Z9989 Dependence on other enabling machines and devices: Secondary | ICD-10-CM

## 2019-08-29 DIAGNOSIS — J4540 Moderate persistent asthma, uncomplicated: Secondary | ICD-10-CM | POA: Diagnosis not present

## 2019-08-29 MED ORDER — LEVALBUTEROL TARTRATE 45 MCG/ACT IN AERO: INHALATION_SPRAY | RESPIRATORY_TRACT | 1 refills | Status: DC

## 2019-08-29 MED ORDER — BUDESONIDE-FORMOTEROL FUMARATE 80-4.5 MCG/ACT IN AERO: 2.0000 | INHALATION_SPRAY | Freq: Two times a day (BID) | RESPIRATORY_TRACT | 5 refills | Status: DC

## 2019-08-29 NOTE — Progress Notes (Signed)
   Subjective:    Patient ID: [name], female    DOB: June 24, 1950, 69 y.o.   MRN: [id number]  HPI  69 yo for FU of for moderate persistent asthma and obstructive sleep apnea CPAP 8 cm  PMH -Atrial fibrillation on Tikosyn  Has done well over the past year, last office visit reviewed, no asthma exacerbations, no nocturnal symptoms, uses Xopenex for rescue about once a week Compliant with Symbicort  Compliant with CPAP and nasal pillows, no problems with mask or pressure, download shows excellent compliance more than 7.5 hours every night with no residual events and minimal leak   Significant tests/ events reviewed Sleep study 02/2014 : severe apnea, corrected with nasal pillows and 8cmh2o  PFT 06/19/16: FVC 2.74 L (95%) FEV1 2.06 L (92%) FEV1/FVC 0.75  negative bronchodilator response DLCO corrected 58%  11/04/15: FVC 2.95 L (102%) FEV1 2.10 L (93%) ratio.71  negative bronchodilator response TLC 6.02 L (109%) RV 109% ERV 47% DLCO uncorrected 75% (hgb 12.7)   12/06/08: FVC 3.43 L (99%) FEV1 2.46 L (94%) ratio 72  negative bronchodilator response TLC 6.17 L (112%) RV 131% ERV 52% DLCO uncorrected 74%  Review of Systems Patient denies significant dyspnea,cough, hemoptysis,  chest pain, palpitations, pedal edema, orthopnea, paroxysmal nocturnal dyspnea, lightheadedness, nausea, vomiting, abdominal or  leg pains      Objective:   Physical Exam  Gen. Pleasant, well-nourished, in no distress ENT - no thrush, no pallor/icterus,no post nasal drip Neck: No JVD, no thyromegaly, no carotid bruits Lungs: no use of accessory muscles, no dullness to percussion, clear without rales or rhonchi  Cardiovascular: Rhythm regular, heart sounds  normal, no murmurs or gallops, no peripheral edema Musculoskeletal: No deformities, no cyanosis or clubbing        Assessment & Plan:

## 2019-08-29 NOTE — Assessment & Plan Note (Signed)
Current setting of 8 cm is working well, she is compliant and CPAP is certainly helped improve her daytime somnolence and fatigue  Weight loss encouraged, compliance with goal of at least 4-6 hrs every night is the expectation. Advised against medications with sedative side effects Cautioned against driving when sleepy - understanding that sleepiness will vary on a day to day basis

## 2019-08-29 NOTE — Assessment & Plan Note (Signed)
Refill on Xopenex. Decrease Symbicort to 80/4.5 -2 puffs twice daily  If does well, then will consider further stepdown to inhaled steroid only

## 2019-08-29 NOTE — Progress Notes (Signed)
EPIC Encounter for ICM Monitoring  Patient Name: [name] is a 69 y.o. female Date: 08/29/2019 Primary Care Physican: Unknown [name], DO Primary Cardiologist:Croitoru Electrophysiologist:Croitoru 5/18/2021Weight: 192lbs  Clinical Status (23-Aug-2019 to 28-Aug-2019)  AT/AF              16 episodes    Time in AT/AF  0.6 hr/day (2.5%)    Spoke with patient and she is doing well.    Optivol thoracic impedancenormal.  Prescribed dosage:Torsemide 20 mg 1 tablet daily. Potassium 20 mEq 1 tabletby mouth as directed.  Labs: 05/04/2019 Creatinine 1.23, BUN 12, Potassium 3.7, Sodium 141, GFR 45-52 A complete set of results can be found in Results Review.  Recommendations: No changes and encouraged to call if experiencing any fluid symptoms.  Follow-up plan: ICM clinic phone appointment on6/21/2021. 91 day device clinic remote transmission 11/22/2019.   Copy of ICM check sent to Dr.Croitoru.  3 month ICM trend: 08/28/2019    1 Year ICM trend:       [name], RN 08/29/2019 4:54 PM

## 2019-08-29 NOTE — Patient Instructions (Signed)
Refill on Xopenex. Decrease Symbicort to 80/4.5 -2 puffs twice daily  CPAP is working well on 8 cm

## 2019-09-09 NOTE — Progress Notes (Signed)
    SUBJECTIVE:   CHIEF COMPLAINT / HPI:   T2DM A1c in April increased from 7.5-7.9 At that time increase Basaglar to 44 units Current regimen: Basaglar 44 units QAM, Invokana, Victoza CBGs 120-140 in the AM, in the evening in the last month, highest number she has seen was 180 Denies polyuria, polydipsia, hypoglycemia  Had an eye exam around August, reports getting them yearly States that she is doing well eating  Asthma Follows with Dr. Marchelle Gearing, last seen on 5/18 Reports that breathing has been doing well She is to follow up in 1 year  OSA Follows with pulm Compliant with CPAP nightly   PERTINENT  PMH / PSH: T2DM, HTN, Chronic Diastolic HF with ICD, A fib on tikosyn, Asthma, OSA on CPAP  OBJECTIVE:   BP 110/62   Ht 5\' 6"  (1.676 m)   Wt 192 lb 6 oz (87.3 kg)   LMP 07/26/2011   BMI 31.05 kg/m    Physical Exam:  General: 69 y.o. female in NAD Cardio: RRR Lungs: CTAB, no wheezing, no rhonchi, no crackles, no IWOB on RA Skin: warm and dry Extremities: No edema   ASSESSMENT/PLAN:   Type 2 diabetes mellitus with diabetic neuropathy, with long-term current use of insulin (HCC) Patient reports improved CBGs with increase in Basaglar to 44 units.  Continue with 73, Invokana, Victoza.  Has been intolerant of Metformin in the past.  Is also been intolerant of ACE/ARB in the past.  Currently on statin.  Plan for to come back in 2 months when she can have a repeat A1c.  Her goal is less than 7.5 so that she can have any surgery.  Advised her to call if her sugars become more elevated, especially if they are over 200 so that adjustments can be made.  Asthma, moderate persistent Lungs clear today.  Doing well.  Follows with pulmonology.  They recently decreased Symbicort and refilled her Xopenex.  Seems that they are also considering stepdown to inhaled steroid only.  She plans to follow-up in 1 year.  OSA on CPAP Doing well.  Reports compliance with CPAP.  Follows with  pulmonology.  Plans to follow-up with him in 1 year.     [place], DO [facility]

## 2019-09-12 ENCOUNTER — Other Ambulatory Visit: Payer: Self-pay

## 2019-09-12 ENCOUNTER — Ambulatory Visit (INDEPENDENT_AMBULATORY_CARE_PROVIDER_SITE_OTHER): Payer: Medicare HMO | Admitting: Family Medicine

## 2019-09-12 ENCOUNTER — Encounter: Payer: Self-pay | Admitting: Family Medicine

## 2019-09-12 VITALS — BP 110/62 | Ht 66.0 in | Wt 192.4 lb

## 2019-09-12 DIAGNOSIS — J4540 Moderate persistent asthma, uncomplicated: Secondary | ICD-10-CM

## 2019-09-12 DIAGNOSIS — G4733 Obstructive sleep apnea (adult) (pediatric): Secondary | ICD-10-CM | POA: Diagnosis not present

## 2019-09-12 DIAGNOSIS — Z794 Long term (current) use of insulin: Secondary | ICD-10-CM | POA: Diagnosis not present

## 2019-09-12 DIAGNOSIS — E1140 Type 2 diabetes mellitus with diabetic neuropathy, unspecified: Secondary | ICD-10-CM | POA: Diagnosis not present

## 2019-09-12 DIAGNOSIS — Z9989 Dependence on other enabling machines and devices: Secondary | ICD-10-CM

## 2019-09-12 NOTE — Assessment & Plan Note (Signed)
Lungs clear today.  Doing well.  Follows with pulmonology.  They recently decreased Symbicort and refilled her Xopenex.  Seems that they are also considering stepdown to inhaled steroid only.  She plans to follow-up in 1 year.

## 2019-09-12 NOTE — Assessment & Plan Note (Signed)
Doing well.  Reports compliance with CPAP.  Follows with pulmonology.  Plans to follow-up with him in 1 year.

## 2019-09-12 NOTE — Patient Instructions (Signed)
Thank you for coming to see me today. It was a pleasure. Today we talked about:   Check your sugars 1-2 hrs after eating in the evenings.  Keep up the great work!  Your appointment will need to be after 7/20 for your A1c to be checked.  Please follow-up with me on or after 7/20.  If you have any questions or concerns, please do not hesitate to call the office at [phone].  Best,   Luis Abed, DO

## 2019-09-12 NOTE — Assessment & Plan Note (Signed)
Patient reports improved CBGs with increase in Basaglar to 44 units.  Continue with [place], Invokana, Victoza.  Has been intolerant of Metformin in the past.  Is also been intolerant of ACE/ARB in the past.  Currently on statin.  Plan for to come back in 2 months when she can have a repeat A1c.  Her goal is less than 7.5 so that she can have any surgery.  Advised her to call if her sugars become more elevated, especially if they are over 200 so that adjustments can be made.

## 2019-09-14 DIAGNOSIS — J449 Chronic obstructive pulmonary disease, unspecified: Secondary | ICD-10-CM | POA: Diagnosis not present

## 2019-09-14 DIAGNOSIS — J45909 Unspecified asthma, uncomplicated: Secondary | ICD-10-CM | POA: Diagnosis not present

## 2019-09-19 ENCOUNTER — Other Ambulatory Visit: Payer: Self-pay

## 2019-09-19 ENCOUNTER — Ambulatory Visit: Payer: Medicare HMO | Admitting: Sports Medicine

## 2019-09-19 ENCOUNTER — Encounter: Payer: Self-pay | Admitting: Sports Medicine

## 2019-09-19 VITALS — Temp 97.3°F

## 2019-09-19 DIAGNOSIS — M79676 Pain in unspecified toe(s): Secondary | ICD-10-CM

## 2019-09-19 DIAGNOSIS — M2042 Other hammer toe(s) (acquired), left foot: Secondary | ICD-10-CM

## 2019-09-19 DIAGNOSIS — Z794 Long term (current) use of insulin: Secondary | ICD-10-CM

## 2019-09-19 DIAGNOSIS — L84 Corns and callosities: Secondary | ICD-10-CM | POA: Diagnosis not present

## 2019-09-19 DIAGNOSIS — I739 Peripheral vascular disease, unspecified: Secondary | ICD-10-CM

## 2019-09-19 DIAGNOSIS — M2041 Other hammer toe(s) (acquired), right foot: Secondary | ICD-10-CM

## 2019-09-19 DIAGNOSIS — B351 Tinea unguium: Secondary | ICD-10-CM | POA: Diagnosis not present

## 2019-09-19 DIAGNOSIS — M2142 Flat foot [pes planus] (acquired), left foot: Secondary | ICD-10-CM

## 2019-09-19 DIAGNOSIS — E0842 Diabetes mellitus due to underlying condition with diabetic polyneuropathy: Secondary | ICD-10-CM | POA: Diagnosis not present

## 2019-09-19 DIAGNOSIS — M21619 Bunion of unspecified foot: Secondary | ICD-10-CM

## 2019-09-19 DIAGNOSIS — E1140 Type 2 diabetes mellitus with diabetic neuropathy, unspecified: Secondary | ICD-10-CM

## 2019-09-19 DIAGNOSIS — M2141 Flat foot [pes planus] (acquired), right foot: Secondary | ICD-10-CM

## 2019-09-19 NOTE — Progress Notes (Signed)
Subjective: [name] is a 69 y.o. female patient with history of diabetes who presents to office today complaining of long,mildly painful nails and callus while ambulating in shoes; unable to trim. Patient states that the glucose reading this morning was 145 still on Victoza. No new issues besides her A1c increasing a little bit therefore they did not do her knee surgery.  No other issues noted.   Patient Active Problem List   Diagnosis Date Noted  . Iliotibial band syndrome, left 11/01/2018  . GERD (gastroesophageal reflux disease) 03/23/2018  . Chronic diastolic heart failure (HCC) 01/07/2018  . Mild obesity 01/07/2018  . Arthritis of carpometacarpal (CMC) joint of thumb 03/26/2016  . Primary localized osteoarthritis of right knee   . Visit for monitoring Tikosyn therapy 08/21/2015  . Long term current use of anticoagulant therapy 08/21/2015  . Ventricular tachycardia (HCC) 05/22/2015  . Obesity (BMI 30-39.9) 01/04/2014  . ICD (St. Jude Protecta dual-chamber),secondary prevention (VF arrest) January 2012 11/19/2012  . CKD (chronic kidney disease) stage 2, GFR 60-89 ml/min 02/22/2012  . Asthma, moderate persistent 12/10/2011  . Diabetic neuropathy (HCC) 09/18/2011  . Osteoarthritis of right knee 08/28/2008  . Type 2 diabetes mellitus with diabetic neuropathy, with long-term current use of insulin (HCC) 06/10/2006  . HYPERCHOLESTEROLEMIA 06/10/2006  . Essential hypertension 06/10/2006  . OSA on CPAP 06/10/2006   Current Outpatient Medications on File Prior to Visit  Medication Sig Dispense Refill  . apixaban (ELIQUIS) 5 MG TABS tablet Take 1 tablet (5 mg total) by mouth 2 (two) times daily. 180 tablet 1  . bisoprolol (ZEBETA) 10 MG tablet Take 1 tablet (10 mg total) by mouth 2 (two) times daily. 180 tablet 2  . budesonide-formoterol (SYMBICORT) 80-4.5 MCG/ACT inhaler Inhale 2 puffs into the lungs in the morning and at bedtime. 1 Inhaler 5  . buPROPion (WELLBUTRIN XL) 150 MG 24 hr  tablet Take 1 tablet (150 mg total) by mouth daily. 90 tablet 3  . canagliflozin (INVOKANA) 100 MG TABS tablet Take 1 tablet (100 mg total) by mouth daily. 90 tablet 3  . dofetilide (TIKOSYN) 250 MCG capsule Take 1 capsule (250 mcg total) by mouth 2 (two) times daily. 180 capsule 1  . famotidine (PEPCID) 20 MG tablet Take 1 tablet (20 mg total) by mouth daily. 90 tablet 3  . fluticasone (FLONASE) 50 MCG/ACT nasal spray instill 1 spray into each nostril twice a day 16 g 5  . glucose blood test strip Use as instructed 100 each 12  . insulin aspart (NOVOLOG FLEXPEN) 100 UNIT/ML FlexPen Inject 5-8 Units into the skin See admin instructions. Inject 5-8 units SQ before supper 15 mL 3  . Insulin Glargine (BASAGLAR KWIKPEN) 100 UNIT/ML ADMINISTER 44 UNITS UNDER THE SKIN DAILY 15 mL 1  . Insulin Pen Needle (B-D ULTRAFINE III SHORT PEN) 31G X 8 MM MISC Use as directed up to 4 times daily to check blood sugar 100 each 9  . levalbuterol (XOPENEX HFA) 45 MCG/ACT inhaler INHALE 1 TO 2 PUFFS BY MOUTH INTO THE LUNGS EVERY 4 HOURS IF NEEDED FOR WHEEZING 1 Inhaler 1  . levalbuterol (XOPENEX) 0.63 MG/3ML nebulizer solution One vial in nebulizer four times daily as needed 360 mL 5  . liraglutide (VICTOZA) 18 MG/3ML SOPN Inject 0.3 mLs (1.8 mg total) into the skin daily. 9 mL 3  . montelukast (SINGULAIR) 10 MG tablet TAKE 1 TABLET BY MOUTH AT BEDTIME 90 tablet 1  . polyethylene glycol powder (GLYCOLAX/MIRALAX) powder take 17GM (DISSOLVED  IN WATER) by mouth once daily THREE TIMES A WEEK if needed as directed 500 g 3  . potassium chloride SA (K-DUR,KLOR-CON) 20 MEQ tablet Take 1 tablet (20 mEq total) by mouth daily as directed. 30 tablet 11  . PRODIGY LANCETS 28G MISC 1 Units by Does not apply route 4 (four) times daily. 100 each 4  . rosuvastatin (CRESTOR) 10 MG tablet TAKE 1 TABLET(10 MG) BY MOUTH DAILY 90 tablet 1  . torsemide (DEMADEX) 20 MG tablet Take 1 tablet (20 mg total) by mouth daily. 180 tablet 1   No  current facility-administered medications on file prior to visit.   Allergies  Allergen Reactions  . Avelox [Moxifloxacin Hcl In Nacl] Other (See Comments)    Cardiac arrest per pt  . Nsaids Other (See Comments)    Cardiac arrest per pt Cardiac arrest per pt  . Simvastatin Other (See Comments)    myalgias myalgias myalgias  . Moxifloxacin Other (See Comments)  . Ace Inhibitors Other (See Comments) and Cough    Dry cough  Dry cough  Dry cough   . Jardiance [Empagliflozin] Other (See Comments)    Felt "crazy", fatigue, sweating, denies hypoglycemia while taking  . Latex Rash    Recent Results (from the past 2160 hour(s))  HgB A1c     Status: Abnormal   Collection Time: 08/01/19  8:32 AM  Result Value Ref Range   Hemoglobin A1C     HbA1c POC (<> result, manual entry)     HbA1c, POC (prediabetic range)     HbA1c, POC (controlled diabetic range) 7.9 (A) 0.0 - 7.0 %  CUP PACEART REMOTE DEVICE CHECK     Status: None   Collection Time: 08/23/19  1:22 AM  Result Value Ref Range   Date Time Interrogation Session [id number]    Pulse Generator Manufacturer MERM    Pulse Gen Model [id number] Evera MRI XT DR    Pulse Gen Serial Number [id number]    [facility]    Implantable Pulse Generator Type Implantable Cardiac Defibulator    Implantable Pulse Generator Implant Date [id number]    Implantable Lead Manufacturer [facility]    Implantable Lead Model 5076 CapSureFix Novus    Implantable Lead Serial Number [id number]    Implantable Lead Implant Date [id number]    Implantable Lead Location [id number]    Implantable Lead Manufacturer [employer]    Implantable Lead Model 6935 Sprint Quattro Secure S    Implantable Lead Serial Number [id number]    Implantable Lead Implant Date 25053976    Implantable Lead Location [id number]    Lead Channel Setting Sensing Sensitivity 0.3 mV   Lead Channel Setting Pacing Amplitude 2 V   Lead Channel Setting Pacing Pulse Width 0.4 ms   Lead Channel Setting  Pacing Amplitude 2.5 V   Lead Channel Impedance Value 437 ohm   Lead Channel Sensing Intrinsic Amplitude 1.125 mV   Lead Channel Sensing Intrinsic Amplitude 1.125 mV   Lead Channel Pacing Threshold Amplitude 0.5 V   Lead Channel Pacing Threshold Pulse Width 0.4 ms   Lead Channel Impedance Value 456 ohm   Lead Channel Impedance Value 342 ohm   Lead Channel Sensing Intrinsic Amplitude 13.5 mV   Lead Channel Sensing Intrinsic Amplitude 13.5 mV   Lead Channel Pacing Threshold Amplitude 0.75 V   Lead Channel Pacing Threshold Pulse Width 0.4 ms   HighPow Impedance 75 ohm   Battery Status OK    Battery Remaining Longevity 91 mo  Battery Voltage 2.99 V   Brady Statistic RA Percent Paced 95.59 %   Brady Statistic RV Percent Paced 0.42 %   Brady Statistic AP VP Percent 0.33 %   Brady Statistic AS VP Percent 0.03 %   Brady Statistic AP VS Percent 96.37 %   Brady Statistic AS VS Percent 3.27 %    Objective: General: Patient is awake, alert, and oriented x 3 and in no acute distress.  Integument: Skin is warm, dry and supple bilateral. Nails are tender, long, thickened and dystrophic with subungual debris, consistent with onychomycosis, 1-5 bilateral. No signs of infection. + callus hallux, sub 3-4, and 5th toes bilateral. Remaining integument unremarkable.  Vasculature:  Dorsalis Pedis pulse 1/4 bilateral. Posterior Tibial pulse  1/4 bilateral. Capillary fill time <3 sec 1-5 bilateral. Scant hair growth to the level of the digits.Temperature gradient within normal limits. No varicosities present bilateral. No edema present bilateral.   Neurology: The patient has intact sensation measured with a 5.07/10g Semmes Weinstein Monofilament at all pedal sites bilateral . Vibratory sensation diminished bilateral with tuning fork. No Babinski sign present bilateral.   Musculoskeletal:  Asymptomatic pes planus, bunion and hammertoe pedal deformities noted bilateral. Muscular strength 5/5 in all lower  extremity muscular groups bilateral without pain on range of motion . No tenderness with calf compression bilateral.  Assessment and Plan: Problem List Items Addressed This Visit      Endocrine   Diabetic neuropathy ([place]) (Chronic)   Type 2 diabetes mellitus with diabetic neuropathy, with long-term current use of insulin ([place])    Other Visit Diagnoses    Pain due to onychomycosis of nail    -  Primary   Corns and callosities       Peripheral arterial disease (HCC)       Hammer toes of both feet       Bunion       Pes planus of both feet         -Examined patient. -Educated patient on diabetic foot care, especially with regards to the vascular, neurological and musculoskeletal systems.  Discussed with patient A1c control. -Mechanically debrided callus >5 using sterile chisel blade and debrided all nails 1-5 bilateral using sterile nail nipper and filed with dremel without incident  -Patient to see Liliane Channel for diabetic shoe measurements -Continue with daily skin emollients for callus areas like previous -Patient to return  in 3 months for at risk foot care -Patient advised to call the office if any problems or questions arise in the meantime.  Landis Martins, DPM

## 2019-09-25 ENCOUNTER — Other Ambulatory Visit: Payer: Self-pay | Admitting: Family Medicine

## 2019-09-25 DIAGNOSIS — Z1231 Encounter for screening mammogram for malignant neoplasm of breast: Secondary | ICD-10-CM

## 2019-10-02 ENCOUNTER — Ambulatory Visit (INDEPENDENT_AMBULATORY_CARE_PROVIDER_SITE_OTHER): Payer: Medicare HMO

## 2019-10-02 DIAGNOSIS — I5032 Chronic diastolic (congestive) heart failure: Secondary | ICD-10-CM

## 2019-10-02 DIAGNOSIS — Z95810 Presence of automatic (implantable) cardiac defibrillator: Secondary | ICD-10-CM | POA: Diagnosis not present

## 2019-10-03 ENCOUNTER — Ambulatory Visit: Payer: Medicare HMO | Admitting: Orthotics

## 2019-10-03 ENCOUNTER — Other Ambulatory Visit: Payer: Self-pay

## 2019-10-03 DIAGNOSIS — E0842 Diabetes mellitus due to underlying condition with diabetic polyneuropathy: Secondary | ICD-10-CM

## 2019-10-03 DIAGNOSIS — L84 Corns and callosities: Secondary | ICD-10-CM

## 2019-10-03 NOTE — Progress Notes (Signed)
TY! Good to see you in person once a year.

## 2019-10-03 NOTE — Progress Notes (Signed)
EPIC Encounter for ICM Monitoring  Patient Name: [name] is a 69 y.o. female Date: 10/03/2019 Primary Care Physican: Unknown [name], DO Primary Cardiologist:Croitoru Electrophysiologist:Croitoru 6/22/2021Weight: 192lbs  Clinical Status (28-Aug-2019 to 02-Oct-2019)  AT/AF                           12 episodes         Time in AT/AF              0.2 hr/day (0.8%)  Longest AT/AF        97 minutes   Spoke with patient and she is doing well.    Optivol thoracic impedancenormal.  Prescribed dosage:Torsemide 20 mg 1 tablet daily. Potassium 20 mEq 1 tabletby mouth as directed.  Labs: 05/04/2019 Creatinine 1.23, BUN 12, Potassium 3.7, Sodium 141, GFR 45-52 A complete set of results can be found in Results Review.  Recommendations: No changes and encouraged to call if experiencing any fluid symptoms.  Follow-up plan: ICM clinic phone appointment on7/26/2021. 91 day device clinic remote transmission 11/22/2019.   Next office visit scheduled: 10/09/2019 with Dr Royann Shivers  Copy of ICM check sent to Dr.Croitoru.  3 month ICM trend: 10/02/2019    1 Year ICM trend:       [name], RN 10/03/2019 5:03 PM

## 2019-10-03 NOTE — Progress Notes (Signed)

## 2019-10-04 ENCOUNTER — Other Ambulatory Visit: Payer: Self-pay | Admitting: *Deleted

## 2019-10-04 DIAGNOSIS — IMO0002 Reserved for concepts with insufficient information to code with codable children: Secondary | ICD-10-CM

## 2019-10-04 DIAGNOSIS — E1140 Type 2 diabetes mellitus with diabetic neuropathy, unspecified: Secondary | ICD-10-CM

## 2019-10-04 MED ORDER — VICTOZA 18 MG/3ML ~~LOC~~ SOPN: 1.8000 mg | PEN_INJECTOR | Freq: Every day | SUBCUTANEOUS | 3 refills | Status: DC

## 2019-10-04 MED ORDER — BASAGLAR KWIKPEN 100 UNIT/ML ~~LOC~~ SOPN: PEN_INJECTOR | SUBCUTANEOUS | 1 refills | Status: DC

## 2019-10-09 ENCOUNTER — Encounter: Payer: Self-pay | Admitting: Cardiovascular Disease

## 2019-10-09 ENCOUNTER — Telehealth: Payer: Self-pay

## 2019-10-09 ENCOUNTER — Other Ambulatory Visit: Payer: Self-pay

## 2019-10-09 ENCOUNTER — Ambulatory Visit (INDEPENDENT_AMBULATORY_CARE_PROVIDER_SITE_OTHER): Payer: Medicare HMO | Admitting: Cardiovascular Disease

## 2019-10-09 VITALS — BP 138/58 | HR 89 | Ht 62.0 in | Wt 192.8 lb

## 2019-10-09 DIAGNOSIS — J4540 Moderate persistent asthma, uncomplicated: Secondary | ICD-10-CM | POA: Diagnosis not present

## 2019-10-09 DIAGNOSIS — I472 Ventricular tachycardia, unspecified: Secondary | ICD-10-CM

## 2019-10-09 DIAGNOSIS — I5032 Chronic diastolic (congestive) heart failure: Secondary | ICD-10-CM | POA: Diagnosis not present

## 2019-10-09 DIAGNOSIS — I10 Essential (primary) hypertension: Secondary | ICD-10-CM | POA: Diagnosis not present

## 2019-10-09 DIAGNOSIS — E668 Other obesity: Secondary | ICD-10-CM | POA: Diagnosis not present

## 2019-10-09 DIAGNOSIS — Z79899 Other long term (current) drug therapy: Secondary | ICD-10-CM

## 2019-10-09 DIAGNOSIS — N1831 Chronic kidney disease, stage 3a: Secondary | ICD-10-CM

## 2019-10-09 DIAGNOSIS — Z7901 Long term (current) use of anticoagulants: Secondary | ICD-10-CM

## 2019-10-09 DIAGNOSIS — Z5181 Encounter for therapeutic drug level monitoring: Secondary | ICD-10-CM

## 2019-10-09 DIAGNOSIS — E669 Obesity, unspecified: Secondary | ICD-10-CM

## 2019-10-09 DIAGNOSIS — Z95810 Presence of automatic (implantable) cardiac defibrillator: Secondary | ICD-10-CM

## 2019-10-09 DIAGNOSIS — E7800 Pure hypercholesterolemia, unspecified: Secondary | ICD-10-CM

## 2019-10-09 DIAGNOSIS — E1169 Type 2 diabetes mellitus with other specified complication: Secondary | ICD-10-CM

## 2019-10-09 DIAGNOSIS — I480 Paroxysmal atrial fibrillation: Secondary | ICD-10-CM | POA: Diagnosis not present

## 2019-10-09 LAB — BASIC METABOLIC PANEL
BUN/Creatinine Ratio: 18 (ref 12–28)
BUN: 20 mg/dL (ref 8–27)
CO2: 24 mmol/L (ref 20–29)
Calcium: 9 mg/dL (ref 8.7–10.3)
Chloride: 101 mmol/L (ref 96–106)
Creatinine, Ser: 1.12 mg/dL — ABNORMAL HIGH (ref 0.57–1.00)
GFR calc Af Amer: 58 mL/min/{1.73_m2} — ABNORMAL LOW (ref >59)
GFR calc non Af Amer: 51 mL/min/{1.73_m2} — ABNORMAL LOW (ref >59)
Glucose: 119 mg/dL — ABNORMAL HIGH (ref 65–99)
Potassium: 4.4 mmol/L (ref 3.5–5.2)
Sodium: 142 mmol/L (ref 134–144)

## 2019-10-09 LAB — MAGNESIUM: Magnesium: 2.1 mg/dL (ref 1.6–2.3)

## 2019-10-09 NOTE — Telephone Encounter (Signed)
Received: Today [name], Diane Hora, MD  [name], Diane Igo, RN No, this is not a new problem. Occurring off and on for years.

## 2019-10-09 NOTE — Patient Instructions (Signed)
Medication Instructions:  No changes *If you need a refill on your cardiac medications before your next appointment, please call your pharmacy*   Lab Work: Your provider would like for you to have the following labs today: BMET and Magnesium  If you have labs (blood work) drawn today and your tests are completely normal, you will receive your results only by: Marland Kitchen MyChart Message (if you have MyChart) OR . A paper copy in the mail If you have any lab test that is abnormal or we need to change your treatment, we will call you to review the results.   Testing/Procedures: None ordered   Follow-Up: At [facility], you and your health needs are our priority.  As part of our continuing mission to provide you with exceptional heart care, we have created designated Provider Care Teams.  These Care Teams include your primary Cardiologist (physician) and Advanced Practice Providers (APPs -  Physician Assistants and Nurse Practitioners) who all work together to provide you with the care you need, when you need it.  We recommend signing up for the patient portal called "MyChart".  Sign up information is provided on this After Visit Summary.  MyChart is used to connect with patients for Virtual Visits (Telemedicine).  Patients are able to view lab/test results, encounter notes, upcoming appointments, etc.  Non-urgent messages can be sent to your provider as well.   To learn more about what you can do with MyChart, go to [email or web address].    Your next appointment:   6 month(s) on a pacer day  The format for your next appointment:   In Person  Provider:   Thurmon Fair, MD

## 2019-10-09 NOTE — Telephone Encounter (Signed)
Returned patient call.  She asked a follow question after today's office visit.  She would like to know if the 2nd COVID vaccine she received on 06/14/19 could have caused the fast heart rate that was discussed at today's office visit with Dr Royann Shivers.  Advised would send Dr Royann Shivers a message regarding her question and call her back. Message sent to Dr Royann Shivers.

## 2019-10-09 NOTE — Telephone Encounter (Signed)
Spoke with patient and advised of Dr Erin Hearing response and does not think there is any correlation between vaccine and fast heart rate that occurred in March.  She appreciated the follow up.  Advised to call back if she has any further questions.

## 2019-10-09 NOTE — Progress Notes (Signed)
Patient ID: [name], female   DOB: July 28, 1950, 69 y.o.   MRN: [id number] Patient ID: [name], female   DOB: May 30, 1950, 69 y.o.   MRN: [id number]    Cardiology Office Note    Date:  10/09/2019   ID:  [name], DOB 1950-05-01, MRN [id number]  PCP:  Unknown [name], DO  Cardiologist:   Thurmon Fair, MD   No chief complaint on file.   History of Present Illness:  [name] is a 69 y.o. female with a long-standing history of nonischemic cardiomyopathy, chronic systolic heart failure and paroxysmal atrial fibrillation.    She feels great.  She has no cardiac complaints.  She is still limited by pain in her left knee and knows that she will eventually require surgery.  She is planning a week of vacation with her daughter.  Glycemic control has been good recently, but her most recent A1c was high at 7.9% last April.  Her most recent lipid profile was excellent.  The patient specifically denies any chest pain at rest exertion, dyspnea at rest or with exertion, orthopnea, paroxysmal nocturnal dyspnea, syncope, palpitations, focal neurological deficits, intermittent claudication, lower extremity edema, unexplained weight gain, cough, hemoptysis or wheezing.  Comprehensive device check in the office today shows normal ICD function.  Current generator was implanted in 2019.  She has 96% atrial pacing with good heart rate histogram distribution.  There is only 0.8% ventricular pacing.  The burden of atrial fibrillation is stable at 1.1%.  All episodes are well rate controlled, less than 2 hours in duration and asymptomatic.  Thoracic impedance (OptiVol) has been within normal range.  There have been no episodes of high ventricular rates.  She had antitachycardia pacing for ventricular tachycardia in December 2016. Since then a handful of episodes of nonsustained VT have been recorded, none meeting criteria for treatment. She is compliant with CPAP for obstructive sleep apnea.  She  has a history of moderate nonischemic cardiomyopathy (no coronary disease by cardiac catheterization January 2012), recurrent paroxysmal atrial fibrillation status post 2 ablation procedures (Dr. Orson Aloe), with history of ventricular fibrillation arrest while on treatment with diltiazem and dofetilide and a prolonged QT interval, probably related to simultaneous quinolone therapy. She has a dual-chamber Medtronic Protecta defibrillator implanted in January 2012. In December 2016 her defibrillator recorded an episode of sustained monomorphic ventricular tachycardia. Antitachycardia pacing was unsuccessful at converting the arrhythmia, but she had a "dirty break" before defibrillator shock was delivered. The shock was aborted. Polymorphic VT lasting 2 seconds was recorded in the fall of 2017 during asthma exacerbation, but did not require intervention..  Most recent echo June 2020 showed normal LVEF 55-60%, but reduced GLS -14%. At LV angiography in 2012, EF was 15-20%.  Past Medical History:  Diagnosis Date  . Anemia   . Anxiety   . Arthritis   . Arthritis of carpometacarpal (CMC) joint of thumb 03/26/2016  . Asthma    has had multiple hospitalizations for this  . Asthma, moderate persistent 12/10/2011  . Atrial fibrillation (HCC)    ablation x 2 [facility], 01/2006, 2011.  on warfarin  . Cardiac arrest [facility]) jan 2012   in hospital for pneumonia when this occured- occured at the hospital  . CHF (congestive heart failure) (HCC) 2012   Echo 08/08/10 by [facility]. EF 35-45%. LV systolic function moderately reduced. Moderate global hypokinesis of LV.  RV systolic function moderately reduced. Mild MR. Trace AR.  Marland Kitchen Chronic diastolic  heart failure (HCC) 01/07/2018  . CKD (chronic kidney disease) stage 2, GFR 60-89 ml/min 02/22/2012   Her cr range 1.2-1.5 since Jan 2012 after hospitalization. 10/13 Estimated Creatinine Clearance: 69.3 ml/min (by C-G formula based on Cr of 1).    . Complication of  anesthesia    difficult time waking up after anesthesia  . Depression   . Diabetes mellitus    Type 2  . Diabetic neuropathy (HCC) 09/18/2011  . GASTROESOPHAGEAL REFLUX, NO ESOPHAGITIS 06/10/2006   Qualifier: Diagnosis of  By: Abundio Miu    . GERD (gastroesophageal reflux disease) 03/23/2018  . History of hiatal hernia   . HYPERCHOLESTEROLEMIA 06/10/2006   LDL 93 at 01/2011 check-  Continue pravastatin 20mg  po daily.  --discuss health modifications and possibly increaseing dose at next appt.  Cardiology- Dr little- stopped pravachol on 05/21/11- 2/2 allergies?    07/19/11 Hyperlipidemia   . Hypertension   . ICD (St. Jude Protecta dual-chamber),secondary prevention (VF arrest) January 2012 11/19/2012  . Iliotibial band syndrome, left 11/01/2018  . Limb pain 06/04/2008   LLE, Baker's cyst in popliteal fossa, no DVT  . Long term current use of anticoagulant therapy 08/21/2015  . Non-ischemic cardiomyopathy (HCC)    echo 08/08/10 - EF 35-45% LV and RV systolic function mod reduced  . Obesity (BMI 30-39.9) 01/04/2014  . OSA on CPAP 06/10/2006   Sleep study 02/2014 : severe apnea, corrected with nasal pillows and 8cmh2o    . Osteoarthritis of right knee 08/28/2008   Qualifier: Diagnosis of  By: 08/30/2008 MD, Pearletha Forge    . Primary localized osteoarthritis of right knee   . Sleep apnea    wears CPAP nightly  . Type 2 diabetes mellitus with diabetic neuropathy, with long-term current use of insulin (HCC) 06/10/2006             . Ventricular tachycardia (HCC) 05/22/2015  . Visit for monitoring Tikosyn therapy 08/21/2015  . Vocal cord disease     Past Surgical History:  Procedure Laterality Date  . BREAST EXCISIONAL BIOPSY Right 1999  . BREAST LUMPECTOMY Right   . CARDIAC DEFIBRILLATOR PLACEMENT  05/02/10   Medtronic Protecta XT-DR for CHF-VT, last download 04/12/12  . CHOLECYSTECTOMY    . COLONOSCOPY    . HAMMER TOE SURGERY Right   . ICD GENERATOR CHANGEOUT N/A 09/28/2017   Procedure: ICD GENERATOR  CHANGEOUT;  Surgeon: 09/30/2017, MD;  Location: [facility];  Service: Cardiovascular;  Laterality: N/A;  . KNEE ARTHROSCOPY Right   . PAF Ablation     By Dr Thurmon Fair. Now sees Dr Sampson Goon at [facility]  . TOTAL KNEE ARTHROPLASTY Left 09/17/2014   Procedure: TOTAL KNEE ARTHROPLASTY;  Surgeon: 11/17/2014, MD;  [facility];  Service: Orthopedics;  Laterality: Left;  pt has ICD  . TUBAL LIGATION      Outpatient Medications Prior to Visit  Medication Sig Dispense Refill  . apixaban (ELIQUIS) 5 MG TABS tablet Take 1 tablet (5 mg total) by mouth 2 (two) times daily. 180 tablet 1  . bisoprolol (ZEBETA) 10 MG tablet Take 1 tablet (10 mg total) by mouth 2 (two) times daily. 180 tablet 2  . budesonide-formoterol (SYMBICORT) 80-4.5 MCG/ACT inhaler Inhale 2 puffs into the lungs in the morning and at bedtime. 1 Inhaler 5  . buPROPion (WELLBUTRIN XL) 150 MG 24 hr tablet Take 1 tablet (150 mg total) by mouth daily. 90 tablet 3  . canagliflozin (INVOKANA) 100 MG TABS tablet Take 1 tablet (100 mg  total) by mouth daily. 90 tablet 3  . dofetilide (TIKOSYN) 250 MCG capsule Take 1 capsule (250 mcg total) by mouth 2 (two) times daily. 180 capsule 1  . famotidine (PEPCID) 20 MG tablet Take 1 tablet (20 mg total) by mouth daily. 90 tablet 3  . fluticasone (FLONASE) 50 MCG/ACT nasal spray instill 1 spray into each nostril twice a day 16 g 5  . glucose blood test strip Use as instructed 100 each 12  . insulin aspart (NOVOLOG FLEXPEN) 100 UNIT/ML FlexPen Inject 5-8 Units into the skin See admin instructions. Inject 5-8 units SQ before supper 15 mL 3  . Insulin Glargine (BASAGLAR KWIKPEN) 100 UNIT/ML ADMINISTER 44 UNITS UNDER THE SKIN DAILY 15 mL 1  . Insulin Pen Needle (B-D ULTRAFINE III SHORT PEN) 31G X 8 MM MISC Use as directed up to 4 times daily to check blood sugar 100 each 9  . levalbuterol (XOPENEX HFA) 45 MCG/ACT inhaler INHALE 1 TO 2 PUFFS BY MOUTH INTO THE LUNGS EVERY 4 HOURS IF NEEDED FOR  WHEEZING 1 Inhaler 1  . levalbuterol (XOPENEX) 0.63 MG/3ML nebulizer solution One vial in nebulizer four times daily as needed 360 mL 5  . liraglutide (VICTOZA) 18 MG/3ML SOPN Inject 0.3 mLs (1.8 mg total) into the skin daily. 9 mL 3  . montelukast (SINGULAIR) 10 MG tablet TAKE 1 TABLET BY MOUTH AT BEDTIME 90 tablet 1  . polyethylene glycol powder (GLYCOLAX/MIRALAX) powder take 17GM (DISSOLVED IN WATER) by mouth once daily THREE TIMES A WEEK if needed as directed 500 g 3  . potassium chloride SA (K-DUR,KLOR-CON) 20 MEQ tablet Take 1 tablet (20 mEq total) by mouth daily as directed. 30 tablet 11  . PRODIGY LANCETS 28G MISC 1 Units by Does not apply route 4 (four) times daily. 100 each 4  . rosuvastatin (CRESTOR) 10 MG tablet TAKE 1 TABLET(10 MG) BY MOUTH DAILY 90 tablet 1  . torsemide (DEMADEX) 20 MG tablet Take 1 tablet (20 mg total) by mouth daily. 180 tablet 1   No facility-administered medications prior to visit.     Allergies:   Avelox [moxifloxacin hcl in nacl], Nsaids, Simvastatin, Moxifloxacin, Ace inhibitors, Jardiance [empagliflozin], and Latex   Social History   Socioeconomic History  . Marital status: Widowed    Spouse name: Not on file  . Number of children: Not on file  . Years of education: Not on file  . Highest education level: Not on file  Occupational History  . Not on file  Tobacco Use  . Smoking status: Never Smoker  . Smokeless tobacco: Never Used  Vaping Use  . Vaping Use: Never used  Substance and Sexual Activity  . Alcohol use: No  . Drug use: No  . Sexual activity: Never  Other Topics Concern  . Not on file  Social History Narrative   Not employed   Exercise- walking 1 hour daily.    Diet- eating healthy diet.       [facility]:   She is originally from [place]. Has always lived in [place]. 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 Currently has a  small dog. No bird or mold exposure.       Current Social History 04/08/2017        Patient lives with daughter, Lafonda Mosses, in two level home 04/08/2017   Transportation: Patient has own vehicle and drives herself 04/08/2017   Important  Relationships Daughter, Lafonda Mosses 04/08/2017    Pets: Shiatzu Evaristo Bury)  04/08/2017   Education / Work:  10 th grade/ None 04/08/2017   Interests / Fun: Read, do puzzles 04/08/2017   Current Stressors: None because she prays to God 04/08/2017   Religious / Personal Beliefs: Non-Denominational 04/08/2017   L. Ducatte, RN, BSN                                                                                                 Social Determinants of Health   Financial Resource Strain:   . Difficulty of Paying Living Expenses:   Food Insecurity:   . Worried About Programme researcher, broadcasting/film/video in the Last Year:   . Barista in the Last Year:   Transportation Needs:   . Freight forwarder (Medical):   Marland Kitchen Lack of Transportation (Non-Medical):   Physical Activity:   . Days of Exercise per Week:   . Minutes of Exercise per Session:   Stress:   . Feeling of Stress :   Social Connections:   . Frequency of Communication with Friends and Family:   . Frequency of Social Gatherings with Friends and Family:   . Attends Religious Services:   . Active Member of Clubs or Organizations:   . Attends Banker Meetings:   Marland Kitchen Marital Status:      Family History:  The patient's family history includes Diabetes in her brother and father; Hypertension in her brother and father.   ROS:   Please see the history of present illness.    ROS All other systems are reviewed and are negative  PHYSICAL EXAM:   VS:  BP (!) 138/58   Pulse 89   Ht 5\' 2"  (1.575 m)   Wt 192 lb 12.8 oz (87.5 kg)   LMP 07/26/2011   SpO2 100%   BMI 35.26 kg/m       General: Alert, oriented x3, no distress, moderately obese.  Healthy with subclavian defibrillator site Head: no evidence of  trauma, PERRL, EOMI, no exophtalmos or lid lag, no myxedema, no xanthelasma; normal ears, nose and oropharynx Neck: normal jugular venous pulsations and no hepatojugular reflux; brisk carotid pulses without delay and no carotid bruits Chest: clear to auscultation, no signs of consolidation by percussion or palpation, normal fremitus, symmetrical and full respiratory excursions Cardiovascular: normal position and quality of the apical impulse, regular rhythm, normal first and second heart sounds, no murmurs, rubs or gallops Abdomen: no tenderness or distention, no masses by palpation, no abnormal pulsatility or arterial bruits, normal bowel sounds, no hepatosplenomegaly Extremities: no clubbing, cyanosis or edema; 2+ radial, ulnar and brachial pulses bilaterally; 2+ right femoral, posterior tibial and dorsalis pedis pulses; 2+ left femoral, posterior tibial and dorsalis pedis pulses; no subclavian or femoral bruits Neurological: grossly nonfocal Psych: Normal mood and affect    Wt Readings from Last 3 Encounters:  10/09/19 192 lb 12.8 oz (87.5 kg)  09/12/19 192 lb 6 oz (87.3 kg)  08/29/19 192 lb 3.2 oz (87.2 kg)      Studies/Labs Reviewed:   ECHO 09/19/2018  1. The left ventricle has normal systolic function, with an ejection fraction of 55-60%. The cavity size was normal. There is mild asymmetric left ventricular hypertrophy. Left ventricular diastolic Doppler parameters are consistent with impaired  relaxation. No evidence of left ventricular regional wall motion abnormalities.  2. The average left ventricular global longitudinal strain is 14.9 %.  3. The right ventricle has normal systolic function. The cavity was normal. There is no increase in right ventricular wall thickness.  4. Left atrial size was mildly dilated.  5. The aortic valve is tricuspid. Mild sclerosis of the aortic valve. Aortic valve regurgitation is trivial by color flow Doppler.   EKG:  EKG is ordered today.  It  shows atrial paced, ventricular sensed rhythm and an occasional PVC.  Appropriately prolonged QTC 481 ms on dofetilide.  Recent Labs: 02/10/2019: ALT 63 05/04/2019: BUN 12; Creatinine, Ser 1.23; Hemoglobin 13.5; Magnesium 2.1; Platelets 240; Potassium 3.7; Sodium 141   Lipid Panel    Component Value Date/Time   CHOL 123 02/10/2019 1002   TRIG 92 02/10/2019 1002   HDL 51 02/10/2019 1002   CHOLHDL 2.4 02/10/2019 1002   CHOLHDL 2.4 04/21/2016 1438   VLDL 15 04/21/2016 1438   LDLCALC 54 02/10/2019 1002   LDLDIRECT 94 12/07/2007 2019     ASSESSMENT:    No diagnosis found. 1. VT: None has been recorded recently.  Despite marked improvement in left ventricular systolic function she has had ventricular tachycardia that required device intervention.  She is also at risk of developing torsades while in treatment with dofetilide.  ICD is appropriate. 2. PAF: Overall burden of atrial fibrillation remains low at 1.1%.  Asymptomatic.  On appropriate anticoagulation.  CHA2DS2-VASc 4 (CHF, gender, age, hypertension, diabetes mellitus 3. CHF:  euvolemic clinically and by OptiVol.  NYHA functional class I-II .  She has lost some true weight and we will have to continually reassess her "dry weight" which is not clearly at most 200 pounds.   4. ICD: Normal device function 5. Anticoagulation: No history of embolic TIA or stroke.  No bleeding complications. 6. HTN: Adequate control.  Systolic blood pressure 138, but her diastolic blood pressure is low. 7. HLP: All parameters in target range on statin. 8. Obesity: Continue efforts to lose more weight. 9. OSA: Reports 100% compliance with CPAP and denies daytime hypersomnolence. 10. Tikosyn: QTc interval in desirable range.  Reminded her about the need for ECG and lab monitoring every 6 months and reminded her of the risk of drug-drug interactions.. 11. Asthma: Tolerates bisoprolol well although she had wheezing with other beta-blockers in the past. 12. DM:  Trying to optimize glycemic control.  Working hard on diet. 13. CKD: Stable renal function, GFR 50-60.   For some reason her insurance charges more for 10 mg bisoprolol tabs (nonformulary) than it does for 25 mg.  PLAN:  In order of problems listed above:  Fascinating. Have not seen one there before. Medication Adjustments/Labs and Tests Ordered: Current medicines are reviewed at length with the patient today.  Concerns regarding medicines are outlined above.  Medication changes, Labs and Tests ordered today are listed in the Patient Instructions below. There are no Patient Instructions on file for this visit.     Signed, Thurmon Fair, MD  10/09/2019 9:14 AM    [facility] [address] [place], [place], [place]  [address] Phone: [phone]; Fax: [phone]

## 2019-10-14 DIAGNOSIS — J45909 Unspecified asthma, uncomplicated: Secondary | ICD-10-CM | POA: Diagnosis not present

## 2019-10-14 DIAGNOSIS — J449 Chronic obstructive pulmonary disease, unspecified: Secondary | ICD-10-CM | POA: Diagnosis not present

## 2019-10-23 ENCOUNTER — Telehealth: Payer: Self-pay

## 2019-10-23 NOTE — Telephone Encounter (Signed)
Spoke with pt, advised of Dr. Erin Hearing recommednation for f/u with Dr. Johney Frame.  Pt agreeable, sending to scheduling.

## 2019-10-23 NOTE — Telephone Encounter (Signed)
Looks like ATP accelerate the arrhythmia and changed from monomorphic to polymorphic VT. Just 2 weeks ago had ECG with QTc 481 (dofetilide), K 4.0 and Mg 2.1. Please schedule appt w Dr. Johney Frame to discuss whether we should continue dofetilide and whether we should turn off ATP please.

## 2019-10-23 NOTE — Telephone Encounter (Signed)
Carelink alert received- [facility]F event 10/23/19 0210 EGM shows VT 230bpm terminated with 36j shock. Presenting: APVS 90's.   Patient meds include: Bisoprolol 10mg  BID, Tikosyn 250 mcg BID, Potassium Chloride 20 meq daily.   Spoke with pt, she was sleeping at time of event, she was awakened by it but not aware that she had a shock.  She denies any cardiac symptoms at the time or currently.  She confirms compliance with meds as ordered.  Educated pt on DMV regulation restricting operating a motor vehicle for 6 months following event.  Also educated on shock plan, if another shock is received in 24 hour period report to ED.    Pt LOV on 6/28 with Dr. 7/28.  Advised I would forward information over to MD for review, if any changes we will contact her.

## 2019-10-24 ENCOUNTER — Other Ambulatory Visit: Payer: Self-pay

## 2019-10-24 DIAGNOSIS — E1140 Type 2 diabetes mellitus with diabetic neuropathy, unspecified: Secondary | ICD-10-CM

## 2019-10-24 DIAGNOSIS — IMO0002 Reserved for concepts with insufficient information to code with codable children: Secondary | ICD-10-CM

## 2019-10-24 MED ORDER — CANAGLIFLOZIN 100 MG PO TABS: 100.0000 mg | ORAL_TABLET | Freq: Every day | ORAL | 3 refills | Status: DC

## 2019-10-26 ENCOUNTER — Other Ambulatory Visit: Payer: Self-pay

## 2019-10-26 ENCOUNTER — Ambulatory Visit
Admission: RE | Admit: 2019-10-26 | Discharge: 2019-10-26 | Disposition: A | Discharge status: Home / Self Care | Payer: Medicare HMO | Source: Ambulatory Visit | Attending: Family Medicine | Admitting: Family Medicine

## 2019-10-26 DIAGNOSIS — Z1231 Encounter for screening mammogram for malignant neoplasm of breast: Secondary | ICD-10-CM | POA: Diagnosis not present

## 2019-10-28 ENCOUNTER — Other Ambulatory Visit: Payer: Self-pay | Admitting: Cardiovascular Disease

## 2019-10-30 ENCOUNTER — Other Ambulatory Visit: Payer: Self-pay

## 2019-10-31 ENCOUNTER — Telehealth: Payer: Self-pay | Admitting: Cardiovascular Disease

## 2019-10-31 NOTE — Telephone Encounter (Signed)
   *  STAT* If patient is at the pharmacy, call can be transferred to refill team.   1. Which medications need to be refilled? (please list name of each medication and dose if known)   rosuvastatin (CRESTOR) 10 MG tablet    2. Which pharmacy/location (including street and city if local pharmacy) is medication to be sent to? [id number] - [place], [place] - [address] AT [facility] [address]  3. Do they need a 30 day or 90 day supply? 90 days   Spoke to [place] from [facility] and they have to receive the refill. Please resent prescription

## 2019-11-01 ENCOUNTER — Telehealth: Payer: Self-pay

## 2019-11-01 MED ORDER — ROSUVASTATIN CALCIUM 10 MG PO TABS: ORAL_TABLET | ORAL | 3 refills | Status: DC

## 2019-11-01 NOTE — Telephone Encounter (Signed)
Received a call from patient requesting crestor refill.Refill sent to pharmacy.

## 2019-11-02 DIAGNOSIS — E1165 Type 2 diabetes mellitus with hyperglycemia: Secondary | ICD-10-CM | POA: Diagnosis not present

## 2019-11-02 DIAGNOSIS — E119 Type 2 diabetes mellitus without complications: Secondary | ICD-10-CM | POA: Diagnosis not present

## 2019-11-06 ENCOUNTER — Ambulatory Visit (INDEPENDENT_AMBULATORY_CARE_PROVIDER_SITE_OTHER): Payer: Medicare HMO

## 2019-11-06 DIAGNOSIS — Z95810 Presence of automatic (implantable) cardiac defibrillator: Secondary | ICD-10-CM | POA: Diagnosis not present

## 2019-11-06 DIAGNOSIS — I5032 Chronic diastolic (congestive) heart failure: Secondary | ICD-10-CM | POA: Diagnosis not present

## 2019-11-08 NOTE — Progress Notes (Signed)
EPIC Encounter for ICM Monitoring  Patient Name: [name] is a 69 y.o. female Date: 11/08/2019 Primary Care Physican: Unknown [name], DO Primary Cardiologist:Croitoru Electrophysiologist:Croitoru 7/28/2021Weight: 192lbs  Clinical Status (23-Oct-2019 to 06-Nov-2019)  AT/AF 32 episodes   Time in AT/AF 0.8 hr/day (3.4%)  Longest AT/AF             109 minutes    Spoke with patient and reports feeling well at this time.  Denies fluid symptoms.    Optivol thoracic impedancenormal.  Prescribed dosage:  Torsemide 20 mg 1 tablet daily.   Potassium 20 mEq 1 tabletby mouth as directed.  Labs: 10/09/2019 Creatinine 1.12, BUN 20, Potassium 4.4, Sodium 142, GFR 51-58 05/04/2019 Creatinine 1.23, BUN 12, Potassium 3.7, Sodium 141, GFR 45-52 A complete set of results can be found in Results Review.  Recommendations:No changes and encouraged to call if experiencing any fluid symptoms.  Follow-up plan: ICM clinic phone appointment on8/30/2021. 91 day device clinic remote transmission8/02/2020.   Next office visit scheduled: 11/27/2019 with Dr Johney Frame for ATP accelerated VT per appt note.  Recall for 04/06/2020 for Dr Royann Shivers.  Copy of ICM check sent to Dr.Croitoru.  3 month ICM trend: 11/06/2019    1 Year ICM trend:       [name], RN 11/08/2019 11:19 AM

## 2019-11-08 NOTE — Progress Notes (Signed)
Looks great. TY

## 2019-11-10 DIAGNOSIS — J45909 Unspecified asthma, uncomplicated: Secondary | ICD-10-CM | POA: Diagnosis not present

## 2019-11-10 DIAGNOSIS — J449 Chronic obstructive pulmonary disease, unspecified: Secondary | ICD-10-CM | POA: Diagnosis not present

## 2019-11-13 ENCOUNTER — Other Ambulatory Visit: Payer: Self-pay

## 2019-11-13 ENCOUNTER — Ambulatory Visit (INDEPENDENT_AMBULATORY_CARE_PROVIDER_SITE_OTHER): Payer: Medicare HMO | Admitting: Orthotics

## 2019-11-13 DIAGNOSIS — M2142 Flat foot [pes planus] (acquired), left foot: Secondary | ICD-10-CM | POA: Diagnosis not present

## 2019-11-13 DIAGNOSIS — E1140 Type 2 diabetes mellitus with diabetic neuropathy, unspecified: Secondary | ICD-10-CM

## 2019-11-13 DIAGNOSIS — E0842 Diabetes mellitus due to underlying condition with diabetic polyneuropathy: Secondary | ICD-10-CM

## 2019-11-13 DIAGNOSIS — M2141 Flat foot [pes planus] (acquired), right foot: Secondary | ICD-10-CM

## 2019-11-13 DIAGNOSIS — L84 Corns and callosities: Secondary | ICD-10-CM

## 2019-11-13 DIAGNOSIS — Z794 Long term (current) use of insulin: Secondary | ICD-10-CM

## 2019-11-13 DIAGNOSIS — M2041 Other hammer toe(s) (acquired), right foot: Secondary | ICD-10-CM | POA: Diagnosis not present

## 2019-11-13 DIAGNOSIS — M2042 Other hammer toe(s) (acquired), left foot: Secondary | ICD-10-CM | POA: Diagnosis not present

## 2019-11-13 NOTE — Progress Notes (Signed)

## 2019-11-22 ENCOUNTER — Ambulatory Visit (INDEPENDENT_AMBULATORY_CARE_PROVIDER_SITE_OTHER): Payer: Medicare HMO | Admitting: *Deleted

## 2019-11-22 DIAGNOSIS — I472 Ventricular tachycardia, unspecified: Secondary | ICD-10-CM

## 2019-11-22 LAB — CUP PACEART REMOTE DEVICE CHECK
Battery Remaining Longevity: 80 mo
Battery Voltage: 2.99 V
Brady Statistic AP VP Percent: 0.42 %
Brady Statistic AP VS Percent: 95.79 %
Brady Statistic AS VP Percent: 0.03 %
Brady Statistic AS VS Percent: 3.76 %
Brady Statistic RA Percent Paced: 94.61 %
Brady Statistic RV Percent Paced: 0.49 %
Date Time Interrogation Session: 20210811052605
HighPow Impedance: 73 Ohm
Implantable Lead Implant Date: 20120120
Implantable Lead Implant Date: 20120120
Implantable Lead Location: 753859
Implantable Lead Location: 753860
Implantable Lead Model: 5076
Implantable Lead Model: 6935
Implantable Pulse Generator Implant Date: 20190618
Lead Channel Impedance Value: 342 Ohm
Lead Channel Impedance Value: 437 Ohm
Lead Channel Impedance Value: 437 Ohm
Lead Channel Pacing Threshold Amplitude: 0.375 V
Lead Channel Pacing Threshold Amplitude: 0.625 V
Lead Channel Pacing Threshold Pulse Width: 0.4 ms
Lead Channel Pacing Threshold Pulse Width: 0.4 ms
Lead Channel Sensing Intrinsic Amplitude: 1.125 mV
Lead Channel Sensing Intrinsic Amplitude: 1.125 mV
Lead Channel Sensing Intrinsic Amplitude: 12.75 mV
Lead Channel Sensing Intrinsic Amplitude: 12.75 mV
Lead Channel Setting Pacing Amplitude: 2 V
Lead Channel Setting Pacing Amplitude: 2.5 V
Lead Channel Setting Pacing Pulse Width: 0.4 ms
Lead Channel Setting Sensing Sensitivity: 0.3 mV

## 2019-11-27 ENCOUNTER — Encounter: Payer: Self-pay | Admitting: Internal Medicine

## 2019-11-27 ENCOUNTER — Ambulatory Visit: Payer: Medicare HMO | Admitting: Internal Medicine

## 2019-11-27 ENCOUNTER — Other Ambulatory Visit: Payer: Self-pay

## 2019-11-27 VITALS — BP 140/86 | HR 86 | Ht 62.0 in | Wt 192.0 lb

## 2019-11-27 DIAGNOSIS — I480 Paroxysmal atrial fibrillation: Secondary | ICD-10-CM | POA: Diagnosis not present

## 2019-11-27 DIAGNOSIS — I472 Ventricular tachycardia, unspecified: Secondary | ICD-10-CM

## 2019-11-27 DIAGNOSIS — I10 Essential (primary) hypertension: Secondary | ICD-10-CM

## 2019-11-27 NOTE — Patient Instructions (Addendum)
Medication Instructions:  Your physician recommends that you continue on your current medications as directed. Please refer to the Current Medication list given to you today.  *If you need a refill on your cardiac medications before your next appointment, please call your pharmacy*  Lab Work: None ordered.  If you have labs (blood work) drawn today and your tests are completely normal, you will receive your results only by: Marland Kitchen MyChart Message (if you have MyChart) OR . A paper copy in the mail If you have any lab test that is abnormal or we need to change your treatment, we will call you to review the results.  Testing/Procedures: None ordered.  Follow-Up: At [facility], you and your health needs are our priority.  As part of our continuing mission to provide you with exceptional heart care, we have created designated Provider Care Teams.  These Care Teams include your primary Cardiologist (physician) and Advanced Practice Providers (APPs -  Physician Assistants and Nurse Practitioners) who all work together to provide you with the care you need, when you need it.  We recommend signing up for the patient portal called "MyChart".  Sign up information is provided on this After Visit Summary.  MyChart is used to connect with patients for Virtual Visits (Telemedicine).  Patients are able to view lab/test results, encounter notes, upcoming appointments, etc.  Non-urgent messages can be sent to your provider as well.   To learn more about what you can do with MyChart, go to [email or web address].    Your next appointment:   Your physician wants you to follow-up in: 6 months with Dr. Royann Shivers & 1 year with Dr. Johney Frame. You will receive a reminder letter in the mail two months in advance. If you don't receive a letter, please call our office to schedule the follow-up appointment.  Remote monitoring is used to monitor your ICD from home. This monitoring reduces the number of office visits  required to check your device to one time per year. It allows [place] to keep an eye on the functioning of your device to ensure it is working properly. You are scheduled for a device check from home on 02/21/20. You may send your transmission at any time that day. If you have a wireless device, the transmission will be sent automatically. After your physician reviews your transmission, you will receive a postcard with your next transmission date.  Other Instructions:

## 2019-11-27 NOTE — Progress Notes (Signed)
Remote ICD transmission.   

## 2019-11-27 NOTE — Progress Notes (Signed)
PCP: Unknown [name], DO Primary Cardiologist: Dr Royann Shivers Primary EP: Dr Mirian Capuchin is a 69 y.o. female who presents today for routine electrophysiology followup.  Since last being seen in our clinic, the patient reports doing very well.  She did have prior VT which accelerated to VF with ATP.  She was successfully defibrillated.   She was asleep at the time and woke with a startle.  She has otherwise been feeling well. Today, she denies symptoms of palpitations, chest pain, shortness of breath,  lower extremity edema, or dizziness.  The patient is otherwise without complaint today.   Past Medical History:  Diagnosis Date  . Anemia   . Anxiety   . Arthritis   . Arthritis of carpometacarpal (CMC) joint of thumb 03/26/2016  . Asthma    has had multiple hospitalizations for this  . Asthma, moderate persistent 12/10/2011  . Atrial fibrillation (HCC)    ablation x 2 [facility], 01/2006, 2011.  on warfarin  . Cardiac arrest [facility]) jan 2012   in hospital for pneumonia when this occured- occured at the hospital  . CHF (congestive heart failure) (HCC) 2012   Echo 08/08/10 by [facility]. EF 35-45%. LV systolic function moderately reduced. Moderate global hypokinesis of LV.  RV systolic function moderately reduced. Mild MR. Trace AR.  Marland Kitchen Chronic diastolic heart failure (HCC) 01/07/2018  . CKD (chronic kidney disease) stage 2, GFR 60-89 ml/min 02/22/2012   Her cr range 1.2-1.5 since Jan 2012 after hospitalization. 10/13 Estimated Creatinine Clearance: 69.3 ml/min (by C-G formula based on Cr of 1).    . Complication of anesthesia    difficult time waking up after anesthesia  . Depression   . Diabetes mellitus    Type 2  . Diabetic neuropathy (HCC) 09/18/2011  . GASTROESOPHAGEAL REFLUX, NO ESOPHAGITIS 06/10/2006   Qualifier: Diagnosis of  By: Abundio Miu    . GERD (gastroesophageal reflux disease) 03/23/2018  . History of hiatal hernia   . HYPERCHOLESTEROLEMIA 06/10/2006    LDL 93 at 01/2011 check-  Continue pravastatin 20mg  po daily.  --discuss health modifications and possibly increaseing dose at next appt.  Cardiology- Dr little- stopped pravachol on 05/21/11- 2/2 allergies?    07/19/11 Hyperlipidemia   . Hypertension   . ICD (St. Jude Protecta dual-chamber),secondary prevention (VF arrest) January 2012 11/19/2012  . Iliotibial band syndrome, left 11/01/2018  . Limb pain 06/04/2008   LLE, Baker's cyst in popliteal fossa, no DVT  . Long term current use of anticoagulant therapy 08/21/2015  . Non-ischemic cardiomyopathy (HCC)    echo 08/08/10 - EF 35-45% LV and RV systolic function mod reduced  . Obesity (BMI 30-39.9) 01/04/2014  . OSA on CPAP 06/10/2006   Sleep study 02/2014 : severe apnea, corrected with nasal pillows and 8cmh2o    . Osteoarthritis of right knee 08/28/2008   Qualifier: Diagnosis of  By: 08/30/2008 MD, Pearletha Forge    . Primary localized osteoarthritis of right knee   . Sleep apnea    wears CPAP nightly  . Type 2 diabetes mellitus with diabetic neuropathy, with long-term current use of insulin (HCC) 06/10/2006             . Ventricular tachycardia (HCC) 05/22/2015  . Visit for monitoring Tikosyn therapy 08/21/2015  . Vocal cord disease    Past Surgical History:  Procedure Laterality Date  . BREAST EXCISIONAL BIOPSY Right 1999  . BREAST LUMPECTOMY Right   . CARDIAC DEFIBRILLATOR PLACEMENT  05/02/10  Medtronic Protecta XT-DR for CHF-VT, last download 04/12/12  . CHOLECYSTECTOMY    . COLONOSCOPY    . HAMMER TOE SURGERY Right   . ICD GENERATOR CHANGEOUT N/A 09/28/2017   Procedure: ICD GENERATOR CHANGEOUT;  Surgeon: Thurmon Fair, MD;  Location: [facility] INVAS[facility];  Service: Cardiovascular;  Laterality: N/A;  . KNEE ARTHROSCOPY Right   . PAF Ablation     By Dr Sampson Goon. Now sees Dr Steele Berg at [facility]  . TOTAL KNEE ARTHROPLASTY Left 09/17/2014   Procedure: TOTAL KNEE ARTHROPLASTY;  Surgeon: Salvatore Marvel, MD;  [facility];  Service: Orthopedics;  Laterality:  Left;  pt has ICD  . TUBAL LIGATION      ROS- all systems are reviewed and negative except as per HPI above  Current Outpatient Medications  Medication Sig Dispense Refill  . apixaban (ELIQUIS) 5 MG TABS tablet Take 1 tablet (5 mg total) by mouth 2 (two) times daily. 180 tablet 1  . bisoprolol (ZEBETA) 10 MG tablet Take 1 tablet (10 mg total) by mouth 2 (two) times daily. 180 tablet 2  . budesonide-formoterol (SYMBICORT) 80-4.5 MCG/ACT inhaler Inhale 2 puffs into the lungs in the morning and at bedtime. 1 Inhaler 5  . buPROPion (WELLBUTRIN XL) 150 MG 24 hr tablet Take 1 tablet (150 mg total) by mouth daily. 90 tablet 3  . canagliflozin (INVOKANA) 100 MG TABS tablet Take 1 tablet (100 mg total) by mouth daily. 90 tablet 3  . dofetilide (TIKOSYN) 250 MCG capsule Take 1 capsule (250 mcg total) by mouth 2 (two) times daily. 180 capsule 1  . famotidine (PEPCID) 20 MG tablet Take 1 tablet (20 mg total) by mouth daily. 90 tablet 3  . fluticasone (FLONASE) 50 MCG/ACT nasal spray instill 1 spray into each nostril twice a day 16 g 5  . glucose blood test strip Use as instructed 100 each 12  . insulin aspart (NOVOLOG FLEXPEN) 100 UNIT/ML FlexPen Inject 5-8 Units into the skin See admin instructions. Inject 5-8 units SQ before supper 15 mL 3  . Insulin Glargine (BASAGLAR KWIKPEN) 100 UNIT/ML ADMINISTER 44 UNITS UNDER THE SKIN DAILY 15 mL 1  . Insulin Pen Needle (B-D ULTRAFINE III SHORT PEN) 31G X 8 MM MISC Use as directed up to 4 times daily to check blood sugar 100 each 9  . levalbuterol (XOPENEX HFA) 45 MCG/ACT inhaler INHALE 1 TO 2 PUFFS BY MOUTH INTO THE LUNGS EVERY 4 HOURS IF NEEDED FOR WHEEZING 1 Inhaler 1  . levalbuterol (XOPENEX) 0.63 MG/3ML nebulizer solution One vial in nebulizer four times daily as needed 360 mL 5  . liraglutide (VICTOZA) 18 MG/3ML SOPN Inject 0.3 mLs (1.8 mg total) into the skin daily. 9 mL 3  . montelukast (SINGULAIR) 10 MG tablet TAKE 1 TABLET BY MOUTH AT BEDTIME 90 tablet 1   . polyethylene glycol powder (GLYCOLAX/MIRALAX) powder take 17GM (DISSOLVED IN WATER) by mouth once daily THREE TIMES A WEEK if needed as directed 500 g 3  . potassium chloride SA (K-DUR,KLOR-CON) 20 MEQ tablet Take 1 tablet (20 mEq total) by mouth daily as directed. 30 tablet 11  . PRODIGY LANCETS 28G MISC 1 Units by Does not apply route 4 (four) times daily. 100 each 4  . rosuvastatin (CRESTOR) 10 MG tablet Take 10 mg daily 90 tablet 3  . torsemide (DEMADEX) 20 MG tablet Take 1 tablet (20 mg total) by mouth daily. 180 tablet 1   No current facility-administered medications for this visit.    Physical  Exam: Vitals:   11/27/19 1456  BP: 140/86  Pulse: 86  SpO2: 99%  Weight: 192 lb (87.1 kg)  Height: 5\' 2"  (1.575 m)    GEN- The patient is well appearing, alert and oriented x 3 today.   Head- normocephalic, atraumatic Eyes-  Sclera clear, conjunctiva pink Ears- hearing intact Oropharynx- clear Lungs-  , normal work of breathing Chest- ICD pocket is well healed Heart- Regular rate and rhythm  GI- soft  Extremities- no clubbing, cyanosis, or edema  ICD interrogation- reviewed in detail today,  See PACEART report  ekg tracing ordered today is personally reviewed and shows sinus rhythm 86 bpm, PR 210 msec, QRS 90, Qtc 476 msec,   Wt Readings from Last 3 Encounters:  11/27/19 192 lb (87.1 kg)  10/09/19 192 lb 12.8 oz (87.5 kg)  09/12/19 192 lb 6 oz (87.3 kg)    Assessment and Plan:  1.  Chronic systolic dysfunction/ nonischemic CM euvolemic today EF has recovered with sinus Stable on an appropriate medical regimen Normal ICD function See [facility] report No changes today she is not device dependant today  followed in ICM device clinic  2. Paroxysmal atrial fibrillation Well controlled with tikosyn Burden is 1.2% QT is stable Labs from 6/21 are reviewed No changes Continue anticoagulation with eliquis  3. VT She has had prior appropriate therapy for VT with ATP  accelerating the rhythm to VF followed by an ICD shock This was initially monomorphic VT.  I do not feel that this is secondary to tikosyn No changes No driving x 6 months (pt aware). I do not think that adjustment to ATP would be beneficial currently.  Previously ATP, was successful in terminating VT.  We could make less aggressive if this happens again.  4. HTN Stable No change required today  5. OSA Compliant with CPAP    Risks, benefits and potential toxicities for medications prescribed and/or refilled reviewed with patient today.   Return in a year to see me Follow-up with Dr 7/21 in 6 months  Royann Shivers MD, [facility] 11/27/2019 3:10 PM

## 2019-11-30 NOTE — Addendum Note (Signed)
Addended by: Solon Augusta on: 11/30/2019 08:47 AM   Modules accepted: Orders

## 2019-12-04 ENCOUNTER — Other Ambulatory Visit: Payer: Self-pay | Admitting: *Deleted

## 2019-12-04 DIAGNOSIS — E1140 Type 2 diabetes mellitus with diabetic neuropathy, unspecified: Secondary | ICD-10-CM

## 2019-12-04 DIAGNOSIS — IMO0002 Reserved for concepts with insufficient information to code with codable children: Secondary | ICD-10-CM

## 2019-12-04 MED ORDER — BASAGLAR KWIKPEN 100 UNIT/ML ~~LOC~~ SOPN: PEN_INJECTOR | SUBCUTANEOUS | 1 refills | Status: DC

## 2019-12-05 LAB — CUP PACEART INCLINIC DEVICE CHECK
Battery Remaining Longevity: 80 mo
Battery Voltage: 2.98 V
Brady Statistic AP VP Percent: 0.36 %
Brady Statistic AP VS Percent: 95.51 %
Brady Statistic AS VP Percent: 0.05 %
Brady Statistic AS VS Percent: 4.08 %
Brady Statistic RA Percent Paced: 94.41 %
Brady Statistic RV Percent Paced: 0.45 %
Date Time Interrogation Session: 20210816171252
HighPow Impedance: 77 Ohm
Implantable Lead Implant Date: 20120120
Implantable Lead Implant Date: 20120120
Implantable Lead Location: 753859
Implantable Lead Location: 753860
Implantable Lead Model: 5076
Implantable Lead Model: 6935
Implantable Pulse Generator Implant Date: 20190618
Lead Channel Impedance Value: 437 Ohm
Lead Channel Impedance Value: 456 Ohm
Lead Channel Impedance Value: 456 Ohm
Lead Channel Pacing Threshold Amplitude: 0.5 V
Lead Channel Pacing Threshold Amplitude: 0.75 V
Lead Channel Pacing Threshold Pulse Width: 0.4 ms
Lead Channel Pacing Threshold Pulse Width: 0.4 ms
Lead Channel Sensing Intrinsic Amplitude: 1.625 mV
Lead Channel Sensing Intrinsic Amplitude: 14.625 mV
Lead Channel Setting Pacing Amplitude: 2 V
Lead Channel Setting Pacing Amplitude: 2.5 V
Lead Channel Setting Pacing Pulse Width: 0.4 ms
Lead Channel Setting Sensing Sensitivity: 0.3 mV

## 2019-12-07 NOTE — Progress Notes (Signed)
    SUBJECTIVE:   CHIEF COMPLAINT / HPI:   T2DM Last A1c in April 7.9, today 7.9 Current regimen: Basaglar 44 units, Invokana 100mg , Victoza 1.8mg  History of intolerance to Metformin, ACE/ARB Currently on Crestor 10 mg daily Last BMP 6/28, CKD 3a Last lipid panel 02/10/2019, LDL 54 CBGs 109-145 in the AM, at night 130-160 Denies hypoglycemic episodes, polyuria, polydipsia Eye Exam, has one tomorrow  Ventricular tachycardia Patient reports that she had recent shocks from defibrillator, was seen by cardiology afterwards on 8/16 No changes were made, everything looked okay Has not had any shocks since then States that she is not able to drive for 6 months  PERTINENT  PMH / PSH: HTN, T2DM, OSA, asthma, chronic diastolic heart failure, CKD, ventricular tachycardia  OBJECTIVE:   BP 122/72   Pulse 80   Ht 5\' 2"  (1.575 m)   Wt 193 lb 9.6 oz (87.8 kg)   LMP 07/26/2011   SpO2 96%   BMI 35.41 kg/m    Physical Exam:  General: 69 y.o. female in NAD Cardio: RRR  Lungs: CTAB, no wheezing, no rhonchi, no crackles, no IWOB on RA Skin: warm and dry Extremities: No edema   ASSESSMENT/PLAN:   Type 2 diabetes mellitus with diabetic neuropathy, with long-term current use of insulin (HCC) Given patient's age, her goal for A1c would be less than 8.  She is hoping to get a knee replacement however and her orthopedist has given her a goal of less than 7.5.  Given this, we will continue to titrate her regimen.  She has been intolerant of Metformin in the past.  Already on Invokana and Victoza at maximum doses.  Will increase Basaglar to 46 units from 44 units.  Patient to keep a log of her sugars and bring them in in 1 month to see if further adjustment is needed.  She has been intolerant of ACE/ARB's in the past.  She is currently on a statin.  No labs needed today.  Ventricular tachycardia (HCC) Follows with Dr. 07/28/2011, doing well.  Did have defibrillation, but has been seen since then, no  changes were needed per cardiology.  Cannot drive for 6 months.     74, DO [facility]

## 2019-12-08 ENCOUNTER — Other Ambulatory Visit: Payer: Self-pay

## 2019-12-08 ENCOUNTER — Ambulatory Visit (INDEPENDENT_AMBULATORY_CARE_PROVIDER_SITE_OTHER): Payer: Medicare HMO | Admitting: Family Medicine

## 2019-12-08 ENCOUNTER — Encounter: Payer: Self-pay | Admitting: Family Medicine

## 2019-12-08 VITALS — BP 122/72 | HR 80 | Ht 62.0 in | Wt 193.6 lb

## 2019-12-08 DIAGNOSIS — Z794 Long term (current) use of insulin: Secondary | ICD-10-CM | POA: Diagnosis not present

## 2019-12-08 DIAGNOSIS — I472 Ventricular tachycardia, unspecified: Secondary | ICD-10-CM

## 2019-12-08 DIAGNOSIS — E1140 Type 2 diabetes mellitus with diabetic neuropathy, unspecified: Secondary | ICD-10-CM | POA: Diagnosis not present

## 2019-12-08 LAB — POCT GLYCOSYLATED HEMOGLOBIN (HGB A1C): HbA1c, POC (controlled diabetic range): 7.9 % — AB (ref 0.0–7.0)

## 2019-12-08 MED ORDER — BASAGLAR KWIKPEN 100 UNIT/ML ~~LOC~~ SOPN: PEN_INJECTOR | SUBCUTANEOUS | 1 refills | Status: DC

## 2019-12-08 NOTE — Patient Instructions (Signed)
Thank you for coming to see me today. It was a pleasure. Today we talked about:   Increase your basaglar to 46 units.  Keep track of your sugars.    Please follow-up with me in 1 month.  If you have any questions or concerns, please do not hesitate to call the office at [phone].  Best,   Luis Abed, DO

## 2019-12-08 NOTE — Assessment & Plan Note (Signed)
Follows with Dr. Johney Frame, doing well.  Did have defibrillation, but has been seen since then, no changes were needed per cardiology.  Cannot drive for 6 months.

## 2019-12-08 NOTE — Assessment & Plan Note (Signed)
Given patient's age, her goal for A1c would be less than 8.  She is hoping to get a knee replacement however and her orthopedist has given her a goal of less than 7.5.  Given this, we will continue to titrate her regimen.  She has been intolerant of Metformin in the past.  Already on Invokana and Victoza at maximum doses.  Will increase Basaglar to 46 units from 44 units.  Patient to keep a log of her sugars and bring them in in 1 month to see if further adjustment is needed.  She has been intolerant of ACE/ARB's in the past.  She is currently on a statin.  No labs needed today.

## 2019-12-09 DIAGNOSIS — Z0100 Encounter for examination of eyes and vision without abnormal findings: Secondary | ICD-10-CM | POA: Diagnosis not present

## 2019-12-09 DIAGNOSIS — H5200 Hypermetropia, unspecified eye: Secondary | ICD-10-CM | POA: Diagnosis not present

## 2019-12-11 ENCOUNTER — Ambulatory Visit (INDEPENDENT_AMBULATORY_CARE_PROVIDER_SITE_OTHER): Payer: Medicare HMO

## 2019-12-11 DIAGNOSIS — I5032 Chronic diastolic (congestive) heart failure: Secondary | ICD-10-CM | POA: Diagnosis not present

## 2019-12-11 DIAGNOSIS — Z95810 Presence of automatic (implantable) cardiac defibrillator: Secondary | ICD-10-CM

## 2019-12-13 NOTE — Progress Notes (Signed)
EPIC Encounter for ICM Monitoring  Patient Name: [name] is a 69 y.o. female Date: 12/13/2019 Primary Care Physican: Unknown [name], DO Primary Cardiologist:Croitoru Electrophysiologist:Croitoru 7/28/2021Weight: 192lbs  Clinical Status (27-Nov-2019 to 11-Dec-2019)  XO/VA91 episodes  Time in AT/AF0.7 hr/day (2.9%)  Longest AT/AF 3 hours    Spoke with patient and reports feeling well at this time.  Denies fluid symptoms.    Optivol thoracic impedancesuggesting possible fluid accumulation since 12/10/2019.  Prescribed dosage:  Torsemide 20 mg 1 tablet daily.   Potassium 20 mEq 1 tabletby mouth as directed.  Labs: 10/09/2019 Creatinine 1.12, BUN 20, Potassium 4.4, Sodium 142, GFR 51-58 05/04/2019 Creatinine 1.23, BUN 12, Potassium 3.7, Sodium 141, GFR 45-52 A complete set of results can be found in Results Review.  Recommendations:No changes and encouraged to call if experiencing any fluid symptoms.  Follow-up plan: ICM clinic phone appointment on9/10/2019 to recheck fluid levels. 91 day device clinic remote transmission11/01/2020.   Next office visit scheduled:  Recall for 04/06/2020 for Dr Royann Shivers.  Copy of ICM check sent to Dr.Croitoru.   3 month ICM trend: 12/11/2019    1 Year ICM trend:       [name], RN 12/13/2019 10:03 AM

## 2019-12-13 NOTE — Progress Notes (Signed)
TY

## 2019-12-19 ENCOUNTER — Ambulatory Visit (INDEPENDENT_AMBULATORY_CARE_PROVIDER_SITE_OTHER): Payer: Medicare HMO

## 2019-12-19 DIAGNOSIS — I5032 Chronic diastolic (congestive) heart failure: Secondary | ICD-10-CM

## 2019-12-19 DIAGNOSIS — Z95810 Presence of automatic (implantable) cardiac defibrillator: Secondary | ICD-10-CM

## 2019-12-20 NOTE — Progress Notes (Signed)
EPIC Encounter for ICM Monitoring  Patient Name: [name] is a 69 y.o. female Date: 12/20/2019 Primary Care Physican: Unknown [name], DO Primary Cardiologist:Croitoru Electrophysiologist:Croitoru 9/8/2021Weight: 192lbs  Clinical Status (11-Dec-2019 to 19-Dec-2019) AT/AF               14 episodes  Time in AT/AF   0.6 hr/day (2.5%)   Spoke with patient and reports feeling well at this time.  Denies fluid symptoms.    Optivol thoracic impedancereturned to normal.  Prescribed dosage:  Torsemide 20 mg 1 tablet daily.   Potassium 20 mEq 1 tabletby mouth as directed.  Labs: 10/09/2019 Creatinine 1.12, BUN 20, Potassium 4.4, Sodium 142, GFR 51-58 05/04/2019 Creatinine 1.23, BUN 12, Potassium 3.7, Sodium 141, GFR 45-52 A complete set of results can be found in Results Review.  Recommendations:No changes and encouraged to call if experiencing any fluid symptoms.  Follow-up plan: ICM clinic phone appointment on10/07/2019. 91 day device clinic remote transmission11/01/2020.   Next office visit scheduled:Recall for 04/06/2020 for Dr Royann Shivers.  Copy of ICM check sent to Dr.Croitoru.    3 month ICM trend: 12/19/2019    1 Year ICM trend:       [name], RN 12/20/2019 3:38 PM

## 2019-12-20 NOTE — Progress Notes (Signed)
Everyone OK today. Thank you

## 2019-12-21 ENCOUNTER — Other Ambulatory Visit: Payer: Self-pay

## 2019-12-21 ENCOUNTER — Ambulatory Visit (INDEPENDENT_AMBULATORY_CARE_PROVIDER_SITE_OTHER): Payer: Medicare HMO | Admitting: Sports Medicine

## 2019-12-21 ENCOUNTER — Encounter: Payer: Self-pay | Admitting: Sports Medicine

## 2019-12-21 DIAGNOSIS — M21619 Bunion of unspecified foot: Secondary | ICD-10-CM

## 2019-12-21 DIAGNOSIS — M79676 Pain in unspecified toe(s): Secondary | ICD-10-CM

## 2019-12-21 DIAGNOSIS — L84 Corns and callosities: Secondary | ICD-10-CM

## 2019-12-21 DIAGNOSIS — Z794 Long term (current) use of insulin: Secondary | ICD-10-CM

## 2019-12-21 DIAGNOSIS — M2041 Other hammer toe(s) (acquired), right foot: Secondary | ICD-10-CM

## 2019-12-21 DIAGNOSIS — M2141 Flat foot [pes planus] (acquired), right foot: Secondary | ICD-10-CM

## 2019-12-21 DIAGNOSIS — M2142 Flat foot [pes planus] (acquired), left foot: Secondary | ICD-10-CM

## 2019-12-21 DIAGNOSIS — B351 Tinea unguium: Secondary | ICD-10-CM

## 2019-12-21 DIAGNOSIS — I739 Peripheral vascular disease, unspecified: Secondary | ICD-10-CM

## 2019-12-21 DIAGNOSIS — E1140 Type 2 diabetes mellitus with diabetic neuropathy, unspecified: Secondary | ICD-10-CM

## 2019-12-21 DIAGNOSIS — M2042 Other hammer toe(s) (acquired), left foot: Secondary | ICD-10-CM

## 2019-12-21 DIAGNOSIS — M79609 Pain in unspecified limb: Secondary | ICD-10-CM

## 2019-12-21 NOTE — Progress Notes (Signed)
Subjective: [name] is a 69 y.o. female patient with history of diabetes who presents to office today complaining of long,mildly painful nails and callus while ambulating in shoes; unable to trim. Patient states that the glucose reading this morning was 131 still on Victoza.  Last A1c 7.9.  No other issues noted.   Patient Active Problem List   Diagnosis Date Noted  . Iliotibial band syndrome, left 11/01/2018  . GERD (gastroesophageal reflux disease) 03/23/2018  . Chronic diastolic heart failure ([place]) 01/07/2018  . Mild obesity 01/07/2018  . Arthritis of carpometacarpal (CMC) joint of thumb 03/26/2016  . Primary localized osteoarthritis of right knee   . Visit for monitoring Tikosyn therapy 08/21/2015  . Long term current use of anticoagulant therapy 08/21/2015  . Ventricular tachycardia ([place]) 05/22/2015  . Obesity (BMI 30-39.9) 01/04/2014  . ICD (St. Jude Protecta dual-chamber),secondary prevention (VF arrest) January 2012 11/19/2012  . CKD (chronic kidney disease) stage 2, GFR 60-89 ml/min 02/22/2012  . Asthma, moderate persistent 12/10/2011  . Diabetic neuropathy ([place]) 09/18/2011  . Osteoarthritis of right knee 08/28/2008  . Type 2 diabetes mellitus with diabetic neuropathy, with long-term current use of insulin ([place]) 06/10/2006  . HYPERCHOLESTEROLEMIA 06/10/2006  . Essential hypertension 06/10/2006  . OSA on CPAP 06/10/2006   Current Outpatient Medications on File Prior to Visit  Medication Sig Dispense Refill  . apixaban (ELIQUIS) 5 MG TABS tablet Take 1 tablet (5 mg total) by mouth 2 (two) times daily. 180 tablet 1  . bisoprolol (ZEBETA) 10 MG tablet Take 1 tablet (10 mg total) by mouth 2 (two) times daily. 180 tablet 2  . budesonide-formoterol (SYMBICORT) 80-4.5 MCG/ACT inhaler Inhale 2 puffs into the lungs in the morning and at bedtime. 1 Inhaler 5  . buPROPion (WELLBUTRIN XL) 150 MG 24 hr tablet Take 1 tablet (150 mg total) by mouth daily. 90 tablet 3  . canagliflozin  (INVOKANA) 100 MG TABS tablet Take 1 tablet (100 mg total) by mouth daily. 90 tablet 3  . dofetilide (TIKOSYN) 250 MCG capsule Take 1 capsule (250 mcg total) by mouth 2 (two) times daily. 180 capsule 1  . famotidine (PEPCID) 20 MG tablet Take 1 tablet (20 mg total) by mouth daily. 90 tablet 3  . fluticasone (FLONASE) 50 MCG/ACT nasal spray instill 1 spray into each nostril twice a day 16 g 5  . glucose blood test strip Use as instructed 100 each 12  . insulin aspart (NOVOLOG FLEXPEN) 100 UNIT/ML FlexPen Inject 5-8 Units into the skin See admin instructions. Inject 5-8 units SQ before supper 15 mL 3  . Insulin Glargine (BASAGLAR KWIKPEN) 100 UNIT/ML ADMINISTER 46 UNITS UNDER THE SKIN DAILY 15 mL 1  . Insulin Pen Needle (B-D ULTRAFINE III SHORT PEN) 31G X 8 MM MISC Use as directed up to 4 times daily to check blood sugar 100 each 9  . levalbuterol (XOPENEX HFA) 45 MCG/ACT inhaler INHALE 1 TO 2 PUFFS BY MOUTH INTO THE LUNGS EVERY 4 HOURS IF NEEDED FOR WHEEZING 1 Inhaler 1  . levalbuterol (XOPENEX) 0.63 MG/3ML nebulizer solution One vial in nebulizer four times daily as needed 360 mL 5  . liraglutide (VICTOZA) 18 MG/3ML SOPN Inject 0.3 mLs (1.8 mg total) into the skin daily. 9 mL 3  . montelukast (SINGULAIR) 10 MG tablet TAKE 1 TABLET BY MOUTH AT BEDTIME 90 tablet 1  . polyethylene glycol powder (GLYCOLAX/MIRALAX) powder take 17GM (DISSOLVED IN WATER) by mouth once daily THREE TIMES A WEEK if needed as directed  500 g 3  . potassium chloride SA (K-DUR,KLOR-CON) 20 MEQ tablet Take 1 tablet (20 mEq total) by mouth daily as directed. 30 tablet 11  . PRODIGY LANCETS 28G MISC 1 Units by Does not apply route 4 (four) times daily. 100 each 4  . rosuvastatin (CRESTOR) 10 MG tablet Take 10 mg daily 90 tablet 3  . torsemide (DEMADEX) 20 MG tablet Take 1 tablet (20 mg total) by mouth daily. 180 tablet 1   No current facility-administered medications on file prior to visit.   Allergies  Allergen Reactions  .  Avelox [Moxifloxacin Hcl In Nacl] Other (See Comments)    Cardiac arrest per pt  . Nsaids Other (See Comments)    Cardiac arrest per pt Cardiac arrest per pt  . Simvastatin Other (See Comments)    myalgias myalgias myalgias  . Moxifloxacin Other (See Comments)  . Ace Inhibitors Other (See Comments) and Cough    Dry cough  Dry cough  Dry cough   . Jardiance [Empagliflozin] Other (See Comments)    Felt "crazy", fatigue, sweating, denies hypoglycemia while taking  . Latex Rash    Recent Results (from the past 2160 hour(s))  Basic metabolic panel     Status: Abnormal   Collection Time: 10/09/19  9:46 AM  Result Value Ref Range   Glucose 119 (H) 65 - 99 mg/dL   BUN 20 8 - 27 mg/dL   Creatinine, Ser 1.12 (H) 0.57 - 1.00 mg/dL   GFR calc non Af Amer 51 (L) >59 mL/min/1.73   GFR calc Af Amer 58 (L) >59 mL/min/1.73    Comment: **[facility] currently reports eGFR in compliance with the current**   recommendations of the [employer]. [facility] will   update reporting as new guidelines are published from the NKF-ASN   Task force.    BUN/Creatinine Ratio 18 12 - 28   Sodium 142 134 - 144 mmol/L   Potassium 4.4 3.5 - 5.2 mmol/L   Chloride 101 96 - 106 mmol/L   CO2 24 20 - 29 mmol/L   Calcium 9.0 8.7 - 10.3 mg/dL  Magnesium     Status: None   Collection Time: 10/09/19  9:46 AM  Result Value Ref Range   Magnesium 2.1 1.6 - 2.3 mg/dL  CUP PACEART REMOTE DEVICE CHECK     Status: None   Collection Time: 11/22/19  5:26 AM  Result Value Ref Range   Date Time Interrogation Session [phone]    Pulse Generator Manufacturer MERM    Pulse Gen Model [id number] Evera MRI XT DR    Pulse Gen Serial Number [id number]    Clinic Name [facility]    Implantable Pulse Generator Type Implantable Cardiac Defibulator    Implantable Pulse Generator Implant Date [id number]    Implantable Lead Manufacturer [facility]    Implantable Lead Model 5076 CapSureFix Novus    Implantable Lead Serial  Number [id number]    Implantable Lead Implant Date 58099833    Implantable Lead Location [id number]    Implantable Lead Manufacturer [facility]    Implantable Lead Model 6935 Sprint Quattro Secure S    Implantable Lead Serial Number [id number]    Implantable Lead Implant Date 82505397    Implantable Lead Location [id number]    Lead Channel Setting Sensing Sensitivity 0.3 mV   Lead Channel Setting Pacing Amplitude 2 V   Lead Channel Setting Pacing Pulse Width 0.4 ms   Lead Channel Setting Pacing Amplitude 2.5 V   Lead Channel Impedance Value 437  ohm   Lead Channel Sensing Intrinsic Amplitude 1.125 mV   Lead Channel Sensing Intrinsic Amplitude 1.125 mV   Lead Channel Pacing Threshold Amplitude 0.375 V   Lead Channel Pacing Threshold Pulse Width 0.4 ms   Lead Channel Impedance Value 437 ohm   Lead Channel Impedance Value 342 ohm   Lead Channel Sensing Intrinsic Amplitude 12.75 mV   Lead Channel Sensing Intrinsic Amplitude 12.75 mV   Lead Channel Pacing Threshold Amplitude 0.625 V   Lead Channel Pacing Threshold Pulse Width 0.4 ms   HighPow Impedance 73 ohm   Battery Status OK    Battery Remaining Longevity 80 mo   Battery Voltage 2.99 V   Brady Statistic RA Percent Paced 94.61 %   Brady Statistic RV Percent Paced 0.49 %   Brady Statistic AP VP Percent 0.42 %   Brady Statistic AS VP Percent 0.03 %   Brady Statistic AP VS Percent 95.79 %   Brady Statistic AS VS Percent 3.76 %  CUP PACEART INCLINIC DEVICE CHECK     Status: None   Collection Time: 11/27/19  5:12 PM  Result Value Ref Range   Date Time Interrogation Session [id number]    Pulse Generator Manufacturer MERM    Pulse Gen Model [id number] Evera MRI XT DR    Pulse Gen Serial Number [id number]    Clinic Name [facility]    Implantable Pulse Generator Type Implantable Cardiac Defibulator    Implantable Pulse Generator Implant Date [id number]    Implantable Lead Manufacturer [facility]    Implantable Lead Model 5076 CapSureFix Novus     Implantable Lead Serial Number [id number]    Implantable Lead Implant Date [id number]    Implantable Lead Location [id number]    Implantable Lead Manufacturer [facility]    Implantable Lead Model 6935 Sprint Quattro Secure S    Implantable Lead Serial Number [id number]    Implantable Lead Implant Date [id number]    Implantable Lead Location [id number]    Lead Channel Setting Sensing Sensitivity 0.3 mV   Lead Channel Setting Pacing Amplitude 2 V   Lead Channel Setting Pacing Pulse Width 0.4 ms   Lead Channel Setting Pacing Amplitude 2.5 V   Lead Channel Impedance Value 456 ohm   Lead Channel Sensing Intrinsic Amplitude 1.625 mV   Lead Channel Pacing Threshold Amplitude 0.5 V   Lead Channel Pacing Threshold Pulse Width 0.4 ms   Lead Channel Impedance Value 456 ohm   Lead Channel Impedance Value 437 ohm   Lead Channel Sensing Intrinsic Amplitude 14.625 mV   Lead Channel Pacing Threshold Amplitude 0.75 V   Lead Channel Pacing Threshold Pulse Width 0.4 ms   HighPow Impedance 77 ohm   Battery Status OK    Battery Remaining Longevity 80 mo   Battery Voltage 2.98 V   Brady Statistic RA Percent Paced 94.41 %   Brady Statistic RV Percent Paced 0.45 %   Brady Statistic AP VP Percent 0.36 %   Brady Statistic AS VP Percent 0.05 %   Brady Statistic AP VS Percent 95.51 %   Brady Statistic AS VS Percent 4.08 %   Eval Rhythm SR 45   POCT glycosylated hemoglobin (Hb A1C)     Status: Abnormal   Collection Time: 12/08/19  9:04 AM  Result Value Ref Range   Hemoglobin A1C     HbA1c POC (<> result, manual entry)     HbA1c, POC (prediabetic range)     HbA1c, POC (controlled diabetic range) 7.9 (A) 0.0 - 7.0 %  Objective: General: Patient is awake, alert, and oriented x 3 and in no acute distress.  Integument: Skin is warm, dry and supple bilateral. Nails are tender, long, thickened and dystrophic with subungual debris, consistent with onychomycosis, 1-5 bilateral. No signs of infection. + callus hallux, sub 3-4,  and 5th toes bilateral. Remaining integument unremarkable.  Vasculature:  Dorsalis Pedis pulse 1/4 bilateral. Posterior Tibial pulse  1/4 bilateral. Capillary fill time <3 sec 1-5 bilateral. Scant hair growth to the level of the digits.Temperature gradient within normal limits. No varicosities present bilateral. No edema present bilateral.   Neurology: The patient has intact sensation measured with a 5.07/10g Semmes Weinstein Monofilament at all pedal sites bilateral . Vibratory sensation diminished bilateral with tuning fork. No Babinski sign present bilateral.   Musculoskeletal:  Asymptomatic pes planus, bunion and hammertoe pedal deformities noted bilateral. Muscular strength 5/5 in all lower extremity muscular groups bilateral without pain on range of motion . No tenderness with calf compression bilateral.  Assessment and Plan: Problem List Items Addressed This Visit      Endocrine   Type 2 diabetes mellitus with diabetic neuropathy, with long-term current use of insulin ([place])    Other Visit Diagnoses    Pain due to onychomycosis of nail    -  Primary   Corns and callosities       Hammer toes of both feet       Pes planus of both feet       Peripheral arterial disease ([place])       Bunion         -Examined patient. -Educated patient on diabetic foot care, especially with regards to the vascular, neurological and musculoskeletal systems.  Discussed with patient A1c control. -Mechanically debrided callus >5 using sterile chisel blade and debrided all nails 1-5 bilateral using sterile nail nipper and filed with dremel without incident and no additional charge -Continue with diabetic shoes -Continue with daily skin emollients for callus areas like previous -Patient to return  in 3 months for at risk foot care -Patient advised to call the office if any problems or questions arise in the meantime.  Landis Martins, DPM

## 2020-01-03 ENCOUNTER — Ambulatory Visit: Payer: Medicare HMO | Admitting: Family Medicine

## 2020-01-05 ENCOUNTER — Telehealth: Payer: Self-pay | Admitting: Cardiovascular Disease

## 2020-01-05 NOTE — Telephone Encounter (Signed)
*  STAT* If patient is at the pharmacy, call can be transferred to refill team.   1. Which medications need to be refilled? (please list name of each medication and dose if known)  dofetilide (TIKOSYN) 250 MCG capsule  2. Which pharmacy/location (including street and city if local pharmacy) is medication to be sent to? [id number] - [place], [place] - [address] AT [facility] [address]  3. Do they need a 30 day or 90 day supply? 90 with refills

## 2020-01-07 ENCOUNTER — Other Ambulatory Visit: Payer: Self-pay | Admitting: Cardiovascular Disease

## 2020-01-08 ENCOUNTER — Other Ambulatory Visit: Payer: Self-pay

## 2020-01-15 ENCOUNTER — Ambulatory Visit (INDEPENDENT_AMBULATORY_CARE_PROVIDER_SITE_OTHER): Payer: Medicare HMO

## 2020-01-15 DIAGNOSIS — I5032 Chronic diastolic (congestive) heart failure: Secondary | ICD-10-CM | POA: Diagnosis not present

## 2020-01-15 DIAGNOSIS — Z95810 Presence of automatic (implantable) cardiac defibrillator: Secondary | ICD-10-CM

## 2020-01-18 ENCOUNTER — Other Ambulatory Visit: Payer: Self-pay | Admitting: Cardiovascular Disease

## 2020-01-18 NOTE — Progress Notes (Signed)
EPIC Encounter for ICM Monitoring  Patient Name: [name] is a 69 y.o. female Date: 01/18/2020 Primary Care Physican: Unknown [name], DO Primary Cardiologist:Croitoru Electrophysiologist:Croitoru 10/7/2021Weight: 188lbs  Clinical Status (19-Dec-2019 to 15-Jan-2020) AT/AF 20 Time in AT/AF 0.3 hr/day (1.1%) Longest AT/AF 87 minutes   Spoke with patient and reports feeling well at this time. Denies fluid symptoms.   Optivol thoracic impedance normal.  Prescribed dosage:  Torsemide 20 mg 1 tablet daily.   Potassium 20 mEq 1 tabletby mouth as directed.  Labs: 10/09/2019 Creatinine 1.12, BUN 20, Potassium 4.4, Sodium 142, GFR 51-58 05/04/2019 Creatinine 1.23, BUN 12, Potassium 3.7, Sodium 141, GFR 45-52 A complete set of results can be found in Results Review.  Recommendations:No changes and encouraged to call if experiencing any fluid symptoms.  Follow-up plan: ICM clinic phone appointment on11/02/2020. 91 day device clinic remote transmission11/01/2020.   Next office visit scheduled:04/08/2020 for Dr Royann Shivers.  Copy of ICM check sent to Dr.Croitoru.   3 month ICM trend: 01/15/2020    1 Year ICM trend:       [name], RN 01/18/2020 3:46 PM

## 2020-01-18 NOTE — Progress Notes (Signed)
TY. Everyone so well behaved today!

## 2020-01-22 ENCOUNTER — Other Ambulatory Visit: Payer: Self-pay | Admitting: Cardiovascular Disease

## 2020-01-22 ENCOUNTER — Other Ambulatory Visit: Payer: Self-pay | Admitting: Pulmonary Disease

## 2020-01-25 ENCOUNTER — Other Ambulatory Visit: Payer: Self-pay | Admitting: Pulmonary Disease

## 2020-01-30 NOTE — Progress Notes (Signed)
    SUBJECTIVE:   CHIEF COMPLAINT / HPI:   T2DM  Last seen on 8/27, A1c was 7.9 at that time Current regimen: Invokana 100 mg, Victoza 1.8 mg, Basaglar 46 units nightly CBG AM 90-140, Evening 140-175 Yesterday AM her CBG was in 70s, she felt a little dizzy, drank orange juice, banana and felt better Patient continues to have a goal of A1c of 7.5 for her orthopedic surgeon so that she can have her knee replaced Last BMP on 10/09/2019, creatinine stable at that time Eye exam 12/09/2019, was told everything looked good  Hyperlipidemia She is currently on Crestor 10 mg daily Doing well with that, no complaints Last lipid panel 02/10/2019, LDL 54 at that time  Vaginal Itching She reports that since starting Invokana, she has noticed that she has vaginal itching more Has not noticed any changes in discharge, but thinks that she has a yeast infection and would like treatment for this  PERTINENT  PMH / PSH: HTN, T2DM, OSA, asthma, chronic diastolic heart failure, CKD, ventricular tachycardia  OBJECTIVE:   BP 102/70   Pulse 77   Ht 5\' 2"  (1.575 m)   Wt 194 lb (88 kg)   LMP 07/26/2011   SpO2 99%   BMI 35.48 kg/m    Physical Exam:  General: 69 y.o. female in NAD Lungs: Breathing comfortably on room air Skin: warm and dry Extremities: No edema, ambulating without difficulty   ASSESSMENT/PLAN:   Type 2 diabetes mellitus with diabetic neuropathy, with long-term current use of insulin (HCC) She continues to have a goal of an A1c of 7.5 so that she can have her knees replaced.  Her sugars have been well controlled based on CBGs that she brings with her today.  She did have a low yesterday, therefore would not make any increases at this time.  Continue to encourage proper diet, as she is limited by exercise due to her knee pain.  Continue with her current regimen.  Basaglar refilled.  Will obtain BMP and lipid panel today.  She has been intolerant of ACE/ARB in the past and  Metformin.  HYPERCHOLESTEROLEMIA Currently on Crestor 10 mg daily.  We will continue this for now.  Will check lipid panel today.  LDL was at goal last year.  Yeast vaginitis We will go ahead and treat empirically for a yeast infection given that she is on an SGLT2 inhibitor.  Advised her that if this treatment does not improve her symptoms, that she would need to have a vaginal exam performed here or by gynecology as she could be having atrophy and changes related to being postmenopausal.  She voiced understanding.  She will be treated with clotrimazole cream given that she is on Tikosyn as Diflucan and Tikosyn can both prolong QTC.     74, DO [facility]

## 2020-01-31 ENCOUNTER — Ambulatory Visit (INDEPENDENT_AMBULATORY_CARE_PROVIDER_SITE_OTHER): Payer: Medicare HMO | Admitting: Family Medicine

## 2020-01-31 ENCOUNTER — Other Ambulatory Visit: Payer: Self-pay

## 2020-01-31 ENCOUNTER — Encounter: Payer: Self-pay | Admitting: Family Medicine

## 2020-01-31 VITALS — BP 102/70 | HR 77 | Ht 62.0 in | Wt 194.0 lb

## 2020-01-31 DIAGNOSIS — E7800 Pure hypercholesterolemia, unspecified: Secondary | ICD-10-CM | POA: Diagnosis not present

## 2020-01-31 DIAGNOSIS — B373 Candidiasis of vulva and vagina: Secondary | ICD-10-CM | POA: Diagnosis not present

## 2020-01-31 DIAGNOSIS — Z794 Long term (current) use of insulin: Secondary | ICD-10-CM | POA: Diagnosis not present

## 2020-01-31 DIAGNOSIS — B3731 Acute candidiasis of vulva and vagina: Secondary | ICD-10-CM | POA: Insufficient documentation

## 2020-01-31 DIAGNOSIS — Z23 Encounter for immunization: Secondary | ICD-10-CM

## 2020-01-31 DIAGNOSIS — E1140 Type 2 diabetes mellitus with diabetic neuropathy, unspecified: Secondary | ICD-10-CM

## 2020-01-31 MED ORDER — BASAGLAR KWIKPEN 100 UNIT/ML ~~LOC~~ SOPN: PEN_INJECTOR | SUBCUTANEOUS | 1 refills | Status: DC

## 2020-01-31 MED ORDER — CLOTRIMAZOLE 1 % VA CREA: 1.0000 | TOPICAL_CREAM | Freq: Every day | VAGINAL | 0 refills | Status: AC

## 2020-01-31 NOTE — Assessment & Plan Note (Signed)
Currently on Crestor 10 mg daily.  We will continue this for now.  Will check lipid panel today.  LDL was at goal last year.

## 2020-01-31 NOTE — Assessment & Plan Note (Addendum)
She continues to have a goal of an A1c of 7.5 so that she can have her knees replaced.  Her sugars have been well controlled based on CBGs that she brings with her today.  She did have a low yesterday, therefore would not make any increases at this time.  Continue to encourage proper diet, as she is limited by exercise due to her knee pain.  Continue with her current regimen.  Basaglar refilled.  Will obtain BMP and lipid panel today.  She has been intolerant of ACE/ARB in the past and Metformin.

## 2020-01-31 NOTE — Assessment & Plan Note (Addendum)
We will go ahead and treat empirically for a yeast infection given that she is on an SGLT2 inhibitor.  Advised her that if this treatment does not improve her symptoms, that she would need to have a vaginal exam performed here or by gynecology as she could be having atrophy and changes related to being postmenopausal.  She voiced understanding.  She will be treated with clotrimazole cream given that she is on Tikosyn as Diflucan and Tikosyn can both prolong QTC.

## 2020-01-31 NOTE — Patient Instructions (Signed)
Thank you for coming to see me today. It was a pleasure. Today we talked about:   We will get some labs today.  If they are abnormal or we need to do something about them, I will call you.  If they are normal, I will send you a message on MyChart (if it is active) or a letter in the mail.  If you don't hear from [place] in 2 weeks, please call the office at the number below.  We will treat you for a yeast infection, if it doesn't improve in the next week or two, then come back or see a gynecologist to be evaluated.  Please follow-up with me in 1 month, after 11/27 so we can check your A1c again.  If you have any questions or concerns, please do not hesitate to call the office at [phone].  Best,   Luis Abed, DO

## 2020-02-01 ENCOUNTER — Other Ambulatory Visit: Payer: Self-pay | Admitting: Cardiovascular Disease

## 2020-02-01 LAB — LIPID PANEL
Chol/HDL Ratio: 2.5 ratio (ref 0.0–4.4)
Cholesterol, Total: 113 mg/dL (ref 100–199)
HDL: 46 mg/dL (ref >39)
LDL Chol Calc (NIH): 49 mg/dL (ref 0–99)
Triglycerides: 93 mg/dL (ref 0–149)
VLDL Cholesterol Cal: 18 mg/dL (ref 5–40)

## 2020-02-01 LAB — BASIC METABOLIC PANEL
BUN/Creatinine Ratio: 12 (ref 12–28)
BUN: 15 mg/dL (ref 8–27)
CO2: 27 mmol/L (ref 20–29)
Calcium: 9.1 mg/dL (ref 8.7–10.3)
Chloride: 102 mmol/L (ref 96–106)
Creatinine, Ser: 1.21 mg/dL — ABNORMAL HIGH (ref 0.57–1.00)
GFR calc Af Amer: 53 mL/min/{1.73_m2} — ABNORMAL LOW (ref >59)
GFR calc non Af Amer: 46 mL/min/{1.73_m2} — ABNORMAL LOW (ref >59)
Glucose: 201 mg/dL — ABNORMAL HIGH (ref 65–99)
Potassium: 4.6 mmol/L (ref 3.5–5.2)
Sodium: 139 mmol/L (ref 134–144)

## 2020-02-08 DIAGNOSIS — J449 Chronic obstructive pulmonary disease, unspecified: Secondary | ICD-10-CM | POA: Diagnosis not present

## 2020-02-08 DIAGNOSIS — J45909 Unspecified asthma, uncomplicated: Secondary | ICD-10-CM | POA: Diagnosis not present

## 2020-02-12 ENCOUNTER — Ambulatory Visit: Payer: Medicare HMO | Admitting: Pulmonary Disease

## 2020-02-20 ENCOUNTER — Other Ambulatory Visit: Payer: Self-pay | Admitting: *Deleted

## 2020-02-20 DIAGNOSIS — E1140 Type 2 diabetes mellitus with diabetic neuropathy, unspecified: Secondary | ICD-10-CM

## 2020-02-20 DIAGNOSIS — IMO0002 Reserved for concepts with insufficient information to code with codable children: Secondary | ICD-10-CM

## 2020-02-20 MED ORDER — VICTOZA 18 MG/3ML ~~LOC~~ SOPN: 1.8000 mg | PEN_INJECTOR | Freq: Every day | SUBCUTANEOUS | 3 refills | Status: DC

## 2020-02-21 ENCOUNTER — Ambulatory Visit (INDEPENDENT_AMBULATORY_CARE_PROVIDER_SITE_OTHER): Payer: Medicare HMO

## 2020-02-21 DIAGNOSIS — I472 Ventricular tachycardia, unspecified: Secondary | ICD-10-CM

## 2020-02-21 DIAGNOSIS — I5022 Chronic systolic (congestive) heart failure: Secondary | ICD-10-CM

## 2020-02-21 LAB — CUP PACEART REMOTE DEVICE CHECK
Battery Remaining Longevity: 80 mo
Battery Voltage: 2.99 V
Brady Statistic AP VP Percent: 0.76 %
Brady Statistic AP VS Percent: 94.6 %
Brady Statistic AS VP Percent: 0.05 %
Brady Statistic AS VS Percent: 4.59 %
Brady Statistic RA Percent Paced: 94.15 %
Brady Statistic RV Percent Paced: 1.08 %
Date Time Interrogation Session: 20211110043725
HighPow Impedance: 77 Ohm
Implantable Lead Implant Date: 20120120
Implantable Lead Implant Date: 20120120
Implantable Lead Location: 753859
Implantable Lead Location: 753860
Implantable Lead Model: 5076
Implantable Lead Model: 6935
Implantable Pulse Generator Implant Date: 20190618
Lead Channel Impedance Value: 380 Ohm
Lead Channel Impedance Value: 456 Ohm
Lead Channel Impedance Value: 456 Ohm
Lead Channel Pacing Threshold Amplitude: 0.5 V
Lead Channel Pacing Threshold Amplitude: 0.75 V
Lead Channel Pacing Threshold Pulse Width: 0.4 ms
Lead Channel Pacing Threshold Pulse Width: 0.4 ms
Lead Channel Sensing Intrinsic Amplitude: 0.25 mV
Lead Channel Sensing Intrinsic Amplitude: 0.25 mV
Lead Channel Sensing Intrinsic Amplitude: 14 mV
Lead Channel Sensing Intrinsic Amplitude: 14 mV
Lead Channel Setting Pacing Amplitude: 2 V
Lead Channel Setting Pacing Amplitude: 2.5 V
Lead Channel Setting Pacing Pulse Width: 0.4 ms
Lead Channel Setting Sensing Sensitivity: 0.3 mV

## 2020-02-22 ENCOUNTER — Ambulatory Visit (INDEPENDENT_AMBULATORY_CARE_PROVIDER_SITE_OTHER): Payer: Medicare HMO

## 2020-02-22 DIAGNOSIS — Z95810 Presence of automatic (implantable) cardiac defibrillator: Secondary | ICD-10-CM | POA: Diagnosis not present

## 2020-02-22 DIAGNOSIS — I5022 Chronic systolic (congestive) heart failure: Secondary | ICD-10-CM | POA: Diagnosis not present

## 2020-02-22 NOTE — Progress Notes (Signed)
Remote ICD transmission.   

## 2020-02-23 NOTE — Progress Notes (Signed)
EPIC Encounter for ICM Monitoring  Patient Name: [name] is a 69 y.o. female Date: 02/23/2020 Primary Care Physican: Unknown [name], DO Primary Cardiologist:Croitoru Electrophysiologist:Croitoru 11/12/2021Weight: 190 lbs  Clinical Status (15-Jan-2020 to 21-Feb-2020) AT/AF 32 episodes  Time in AT/AF 0.3 hr/day (1.2%)  Longest AT/AF 93 minutes      Spoke with patient and reports feeling well at this time. Denies fluid symptoms.   Optivol thoracic impedance normal.  Prescribed dosage:  Torsemide 20 mg 1 tablet daily.   Potassium 20 mEq 1 tabletby mouth as directed.  Labs: 10/09/2019 Creatinine 1.12, BUN 20, Potassium 4.4, Sodium 142, GFR 51-58 05/04/2019 Creatinine 1.23, BUN 12, Potassium 3.7, Sodium 141, GFR 45-52 A complete set of results can be found in Results Review.  Recommendations:No changes and encouraged to call if experiencing any fluid symptoms.  Follow-up plan: ICM clinic phone appointment on12/13/2021. 91 day device clinic remote transmission2/12/2020.   Next office visit scheduled:04/08/2020 for Dr Royann Shivers.  Copy of ICM check sent to Dr.Croitoru.  3 month ICM trend: 02/21/2020    1 Year ICM trend:       [name], RN 02/23/2020 3:59 PM

## 2020-03-01 ENCOUNTER — Other Ambulatory Visit: Payer: Self-pay | Admitting: Cardiovascular Disease

## 2020-03-01 DIAGNOSIS — E119 Type 2 diabetes mellitus without complications: Secondary | ICD-10-CM | POA: Diagnosis not present

## 2020-03-01 MED ORDER — TORSEMIDE 20 MG PO TABS
20.0000 mg | ORAL_TABLET | Freq: Every day | ORAL | 1 refills | Status: DC
Start: 2020-03-01 — End: 2021-02-21

## 2020-03-01 NOTE — Telephone Encounter (Signed)
*  STAT* If patient is at the pharmacy, call can be transferred to refill team.   1. Which medications need to be refilled? (please list name of each medication and dose if known)  torsemide (DEMADEX) 20 MG tablet  2. Which pharmacy/location (including street and city if local pharmacy) is medication to be sent to? [facility] #[id number] - [place], [place] - [address] AT [facility] OF [address]  3. Do they need a 30 day or 90 day supply? 90 with refills

## 2020-03-05 ENCOUNTER — Other Ambulatory Visit: Payer: Self-pay

## 2020-03-05 MED ORDER — FAMOTIDINE 20 MG PO TABS
20.0000 mg | ORAL_TABLET | Freq: Every day | ORAL | 3 refills | Status: DC
Start: 2020-03-05 — End: 2021-02-21

## 2020-03-05 NOTE — Progress Notes (Signed)
    SUBJECTIVE:   CHIEF COMPLAINT / HPI:   T2DM Last seen on 01/31/2020 Current regimen: Invokana 100 mg, Victoza 1.8 mg, Basaglar 46 units nightly CBGs 130-140 in the AM Last A1c obtained on 8/27 was 7.9, her goal A1c is less than 7.5 in order to have her knees replaced Last BMP on 10/20 with creatinine at baseline, 1.2 Currently on statin, has been intolerant of Metformin and ACE/ARB in the past next last lipid panel on 10/20, LDL 49  PERTINENT  PMH / PSH: Hypertension, ventricular tachycardia, chronic diastolic heart failure, asthma, OSA on CPAP, GERD, T2DM, diabetic neuropathy, CKD stage II  OBJECTIVE:   BP 128/80   Pulse 83   Ht 5\' 2"  (1.575 m)   Wt 193 lb (87.5 kg)   LMP 07/26/2011   SpO2 98%   BMI 35.30 kg/m    Physical Exam:  General: 69 y.o. female in NAD Lungs: Breathing comfortably on room air Skin: warm and dry Extremities: No edema, ambulating without difficulty  Results for orders placed or performed in visit on 03/06/20 (from the past 24 hour(s))  POCT glycosylated hemoglobin (Hb A1C)     Status: Abnormal   Collection Time: 03/06/20  9:26 AM  Result Value Ref Range   Hemoglobin A1C 8.5 (A) 4.0 - 5.6 %   HbA1c POC (<> result, manual entry)     HbA1c, POC (prediabetic range)     HbA1c, POC (controlled diabetic range)     *Note: Due to a large number of results and/or encounters for the requested time period, some results have not been displayed. A complete set of results can be found in Results Review.     ASSESSMENT/PLAN:   Type 2 diabetes mellitus with diabetic neuropathy, with long-term current use of insulin (HCC) Patient's A1c is not elevated to 8.5.  Her goal is 7.5 or less to have her knees replaced.  She is not sure of any changes that she has made in her diet or activity level.  She is asking if she could discontinue Invokana due to vaginal itching.  States that she has had this consistently since she has been on it, and is wondering if there is  anything else that she can do.  She has previously tried 03/08/20 in the past and had the same situation.  She has been intolerant of Metformin in the past.  Risk-benefit discussion had with patient in regards to benefit that is given by SGLT2 inhibitors as far as heart failure, although hers is diastolic, and kidney protection.  She would like to stay on Invokana if at all possible for this added benefit.  States that she would like to still consider changing in the future, but at this time would hold off on that.  Given her increase in A1c, will go ahead and increase her Basaglar to 50 units.  She is advised that if she has low sugars to call immediately.  She will decide by her next visit if she would like to stay on Invokana or if changes will be made.  Could consider glipizide, as this is okay in her age group, as she wants to stay on oral medication.  She is already on a GLP-1 inhibitor.  Also discussed mealtime insulin, but she declines at this time.  She is on a statin, has been intolerant of ACE/ARB.     [place], DO [facility]

## 2020-03-06 ENCOUNTER — Other Ambulatory Visit: Payer: Self-pay

## 2020-03-06 ENCOUNTER — Ambulatory Visit (INDEPENDENT_AMBULATORY_CARE_PROVIDER_SITE_OTHER): Payer: Medicare HMO | Admitting: Family Medicine

## 2020-03-06 ENCOUNTER — Encounter: Payer: Self-pay | Admitting: Family Medicine

## 2020-03-06 VITALS — BP 128/80 | HR 83 | Ht 62.0 in | Wt 193.0 lb

## 2020-03-06 DIAGNOSIS — E1140 Type 2 diabetes mellitus with diabetic neuropathy, unspecified: Secondary | ICD-10-CM | POA: Diagnosis not present

## 2020-03-06 DIAGNOSIS — Z794 Long term (current) use of insulin: Secondary | ICD-10-CM

## 2020-03-06 LAB — POCT GLYCOSYLATED HEMOGLOBIN (HGB A1C): Hemoglobin A1C: 8.5 % — AB (ref 4.0–5.6)

## 2020-03-06 NOTE — Patient Instructions (Signed)
Thank you for coming to see me today. It was a pleasure. Today we talked about:   Increase your insulin to 50 units.  Think about the invokana and if you want to stop it, let me know.  Continue to check your sugars.    Please follow-up with me in 1 month.  If you have any questions or concerns, please do not hesitate to call the office at [phone].  Best,   Luis Abed, DO

## 2020-03-06 NOTE — Assessment & Plan Note (Signed)
Patient's A1c is not elevated to 8.5.  Her goal is 7.5 or less to have her knees replaced.  She is not sure of any changes that she has made in her diet or activity level.  She is asking if she could discontinue Invokana due to vaginal itching.  States that she has had this consistently since she has been on it, and is wondering if there is anything else that she can do.  She has previously tried [place] in the past and had the same situation.  She has been intolerant of Metformin in the past.  Risk-benefit discussion had with patient in regards to benefit that is given by SGLT2 inhibitors as far as heart failure, although hers is diastolic, and kidney protection.  She would like to stay on Invokana if at all possible for this added benefit.  States that she would like to still consider changing in the future, but at this time would hold off on that.  Given her increase in A1c, will go ahead and increase her Basaglar to 50 units.  She is advised that if she has low sugars to call immediately.  She will decide by her next visit if she would like to stay on Invokana or if changes will be made.  Could consider glipizide, as this is okay in her age group, as she wants to stay on oral medication.  She is already on a GLP-1 inhibitor.  Also discussed mealtime insulin, but she declines at this time.  She is on a statin, has been intolerant of ACE/ARB.

## 2020-03-10 DIAGNOSIS — J449 Chronic obstructive pulmonary disease, unspecified: Secondary | ICD-10-CM | POA: Diagnosis not present

## 2020-03-10 DIAGNOSIS — J45909 Unspecified asthma, uncomplicated: Secondary | ICD-10-CM | POA: Diagnosis not present

## 2020-03-14 ENCOUNTER — Ambulatory Visit: Payer: Medicare HMO | Admitting: Pulmonary Disease

## 2020-03-14 ENCOUNTER — Other Ambulatory Visit: Payer: Self-pay

## 2020-03-14 ENCOUNTER — Encounter: Payer: Self-pay | Admitting: Pulmonary Disease

## 2020-03-14 VITALS — BP 132/82 | HR 84 | Temp 97.1°F | Ht 66.0 in | Wt 193.4 lb

## 2020-03-14 DIAGNOSIS — Z9989 Dependence on other enabling machines and devices: Secondary | ICD-10-CM

## 2020-03-14 DIAGNOSIS — G4733 Obstructive sleep apnea (adult) (pediatric): Secondary | ICD-10-CM | POA: Diagnosis not present

## 2020-03-14 DIAGNOSIS — J4540 Moderate persistent asthma, uncomplicated: Secondary | ICD-10-CM | POA: Diagnosis not present

## 2020-03-14 MED ORDER — BUDESONIDE-FORMOTEROL FUMARATE 80-4.5 MCG/ACT IN AERO: INHALATION_SPRAY | RESPIRATORY_TRACT | 11 refills | Status: AC

## 2020-03-14 NOTE — Progress Notes (Signed)
   Subjective:    Patient ID: [name], female    DOB: 11-04-1950, 69 y.o.   MRN: [id number]  HPI  69 yo for FU offor moderate persistent asthma and obstructive sleep apnea CPAP 8 cm  PMH -Atrial fibrillation on Tikosyn  Breathing is stable, on her last visit 6 months ago we decreased Symbicort from  160- 80 . She has tolerated this well, denies frequent albuterol usage, no nocturnal symptoms Compliant with CPAP no problems with mask or pressure. Her CPAP machine is old and is making some noises and she wonders if she would be able to obtain a new machine   Significant tests/ events reviewed Sleep study 02/2014 : severe apnea, corrected with nasal pillows and 8cmh2o  PFT 06/19/16: FVC 2.74 L (95%) FEV1 2.06 L (92%) FEV1/FVC 0.75 negative bronchodilator response DLCO corrected 58%  11/04/15: FVC 2.95 L (102%) FEV1 2.10 L (93%) ratio.71 negative bronchodilator response TLC 6.02 L (109%) RV 109% ERV 47% DLCO uncorrected 75% (hgb 12.7)   12/06/08: FVC 3.43 L (99%) FEV1 2.46 L (94%) ratio 72 negative bronchodilator response TLC 6.17 L (112%) RV 131% ERV 52% DLCO uncorrected 74%  Review of Systems neg for any significant sore throat, dysphagia, itching, sneezing, nasal congestion or excess/ purulent secretions, fever, chills, sweats, unintended wt loss, pleuritic or exertional cp, hempoptysis, orthopnea pnd or change in chronic leg swelling. Also denies presyncope, palpitations, heartburn, abdominal pain, nausea, vomiting, diarrhea or change in bowel or urinary habits, dysuria,hematuria, rash, arthralgias, visual complaints, headache, numbness weakness or ataxia.     Objective:   Physical Exam  Gen. Pleasant, obese, in no distress ENT - no lesions, no post nasal drip Neck: No JVD, no thyromegaly, no carotid bruits Lungs: no use of accessory muscles, no dullness to percussion, decreased without rales or rhonchi  Cardiovascular: Rhythm regular, heart sounds  normal, no  murmurs or gallops, no peripheral edema Musculoskeletal: No deformities, no cyanosis or clubbing , no tremors        Assessment & Plan:

## 2020-03-14 NOTE — Patient Instructions (Signed)
Rx for new CPAP 8 cm will be sent to DME Refills on symbicort

## 2020-03-15 NOTE — Assessment & Plan Note (Signed)
Continue Symbicort 80/4.5 If she remains stable by next visit we can consider stepdown to inhaled steroid only

## 2020-03-15 NOTE — Assessment & Plan Note (Signed)
CPAP download was reviewed which shows excellent compliance on 8 cm, no residual events, minimal leak. CPAP is certainly helped improve her daytime somnolence and fatigue. She would qualify for a new CPAP machine we will send prescription to DME for CPAP of 8 cm  Weight loss encouraged, compliance with goal of at least 4-6 hrs every night is the expectation. Advised against medications with sedative side effects Cautioned against driving when sleepy - understanding that sleepiness will vary on a day to day basis

## 2020-03-21 ENCOUNTER — Ambulatory Visit: Payer: Medicare HMO | Admitting: Sports Medicine

## 2020-03-21 ENCOUNTER — Other Ambulatory Visit: Payer: Self-pay

## 2020-03-21 ENCOUNTER — Encounter: Payer: Self-pay | Admitting: Sports Medicine

## 2020-03-21 DIAGNOSIS — E1140 Type 2 diabetes mellitus with diabetic neuropathy, unspecified: Secondary | ICD-10-CM

## 2020-03-21 DIAGNOSIS — B351 Tinea unguium: Secondary | ICD-10-CM

## 2020-03-21 DIAGNOSIS — I739 Peripheral vascular disease, unspecified: Secondary | ICD-10-CM | POA: Diagnosis not present

## 2020-03-21 DIAGNOSIS — M2142 Flat foot [pes planus] (acquired), left foot: Secondary | ICD-10-CM

## 2020-03-21 DIAGNOSIS — M2042 Other hammer toe(s) (acquired), left foot: Secondary | ICD-10-CM

## 2020-03-21 DIAGNOSIS — M79609 Pain in unspecified limb: Secondary | ICD-10-CM

## 2020-03-21 DIAGNOSIS — L84 Corns and callosities: Secondary | ICD-10-CM

## 2020-03-21 DIAGNOSIS — M2041 Other hammer toe(s) (acquired), right foot: Secondary | ICD-10-CM

## 2020-03-21 DIAGNOSIS — M2141 Flat foot [pes planus] (acquired), right foot: Secondary | ICD-10-CM

## 2020-03-21 DIAGNOSIS — Z794 Long term (current) use of insulin: Secondary | ICD-10-CM

## 2020-03-21 NOTE — Progress Notes (Signed)
Subjective: [name] is a 69 y.o. female patient with history of diabetes who presents to office today complaining of long,mildly painful nails and callus while ambulating in shoes; unable to trim. Patient states that the glucose reading this morning was 133 still on Victoza was increased at last PCP visit 2 weeks ago due to increase in A1c.  No other issues noted.   Patient Active Problem List   Diagnosis Date Noted  . Yeast vaginitis 01/31/2020  . Iliotibial band syndrome, left 11/01/2018  . GERD (gastroesophageal reflux disease) 03/23/2018  . Chronic diastolic heart failure ([place]) 01/07/2018  . Mild obesity 01/07/2018  . Arthritis of carpometacarpal (CMC) joint of thumb 03/26/2016  . Primary localized osteoarthritis of right knee   . Visit for monitoring Tikosyn therapy 08/21/2015  . Long term current use of anticoagulant therapy 08/21/2015  . Ventricular tachycardia ([place]) 05/22/2015  . Obesity (BMI 30-39.9) 01/04/2014  . ICD (St. Jude Protecta dual-chamber),secondary prevention (VF arrest) January 2012 11/19/2012  . CKD (chronic kidney disease) stage 2, GFR 60-89 ml/min 02/22/2012  . Asthma, moderate persistent 12/10/2011  . Diabetic neuropathy ([place]) 09/18/2011  . Osteoarthritis of right knee 08/28/2008  . Type 2 diabetes mellitus with diabetic neuropathy, with long-term current use of insulin ([place]) 06/10/2006  . HYPERCHOLESTEROLEMIA 06/10/2006  . Essential hypertension 06/10/2006  . OSA on CPAP 06/10/2006   Current Outpatient Medications on File Prior to Visit  Medication Sig Dispense Refill  . bisoprolol (ZEBETA) 10 MG tablet TAKE 1 TABLET(10 MG) BY MOUTH TWICE DAILY 180 tablet 2  . budesonide-formoterol (SYMBICORT) 80-4.5 MCG/ACT inhaler INHALE 2 PUFFS INTO THE LUNGS IN THE MORNING AND AT BEDTIME 10.2 g 11  . buPROPion (WELLBUTRIN XL) 150 MG 24 hr tablet Take 1 tablet (150 mg total) by mouth daily. 90 tablet 3  . canagliflozin (INVOKANA) 100 MG TABS tablet Take 1 tablet  (100 mg total) by mouth daily. 90 tablet 3  . dofetilide (TIKOSYN) 250 MCG capsule TAKE 1 CAPSULE(250 MCG) BY MOUTH TWICE DAILY 180 capsule 1  . ELIQUIS 5 MG TABS tablet TAKE 1 TABLET(5 MG) BY MOUTH TWICE DAILY 180 tablet 1  . famotidine (PEPCID) 20 MG tablet Take 1 tablet (20 mg total) by mouth daily. 90 tablet 3  . fluticasone (FLONASE) 50 MCG/ACT nasal spray instill 1 spray into each nostril twice a day 16 g 5  . glucose blood test strip Use as instructed 100 each 12  . Insulin Glargine (BASAGLAR KWIKPEN) 100 UNIT/ML ADMINISTER 46 UNITS UNDER THE SKIN DAILY 15 mL 1  . Insulin Pen Needle (B-D ULTRAFINE III SHORT PEN) 31G X 8 MM MISC Use as directed up to 4 times daily to check blood sugar 100 each 9  . levalbuterol (XOPENEX HFA) 45 MCG/ACT inhaler INHALE 1 TO 2 PUFFS BY MOUTH INTO THE LUNGS EVERY 4 HOURS IF NEEDED FOR WHEEZING 1 Inhaler 1  . levalbuterol (XOPENEX) 0.63 MG/3ML nebulizer solution One vial in nebulizer four times daily as needed 360 mL 5  . liraglutide (VICTOZA) 18 MG/3ML SOPN Inject 1.8 mg into the skin daily. 9 mL 3  . montelukast (SINGULAIR) 10 MG tablet TAKE 1 TABLET BY MOUTH AT BEDTIME 90 tablet 1  . polyethylene glycol powder (GLYCOLAX/MIRALAX) powder take 17GM (DISSOLVED IN WATER) by mouth once daily THREE TIMES A WEEK if needed as directed 500 g 3  . potassium chloride SA (K-DUR,KLOR-CON) 20 MEQ tablet Take 1 tablet (20 mEq total) by mouth daily as directed. 30 tablet 11  .  PRODIGY LANCETS 28G MISC 1 Units by Does not apply route 4 (four) times daily. 100 each 4  . rosuvastatin (CRESTOR) 10 MG tablet Take 10 mg daily 90 tablet 3  . torsemide (DEMADEX) 20 MG tablet Take 1 tablet (20 mg total) by mouth daily. 180 tablet 1   No current facility-administered medications on file prior to visit.   Allergies  Allergen Reactions  . Avelox [Moxifloxacin Hcl In Nacl] Other (See Comments)    Cardiac arrest per pt  . Nsaids Other (See Comments)    Cardiac arrest per pt Cardiac  arrest per pt  . Simvastatin Other (See Comments)    myalgias myalgias myalgias  . Moxifloxacin Other (See Comments)  . Ace Inhibitors Other (See Comments) and Cough    Dry cough  Dry cough  Dry cough   . Jardiance [Empagliflozin] Other (See Comments)    Felt "crazy", fatigue, sweating, denies hypoglycemia while taking  . Latex Rash    Recent Results (from the past 2160 hour(s))  Lipid Panel     Status: None   Collection Time: 01/31/20  9:16 AM  Result Value Ref Range   Cholesterol, Total 113 100 - 199 mg/dL   Triglycerides 93 0 - 149 mg/dL   HDL 46 >79 mg/dL   VLDL Cholesterol Cal 18 5 - 40 mg/dL   LDL Chol Calc (NIH) 49 0 - 99 mg/dL   Chol/HDL Ratio 2.5 0.0 - 4.4 ratio    Comment:                                   T. Chol/HDL Ratio                                             Men  Women                               1/2 Avg.Risk  3.4    3.3                                   Avg.Risk  5.0    4.4                                2X Avg.Risk  9.6    7.1                                3X Avg.Risk 23.4   11.0   Basic Metabolic Panel     Status: Abnormal   Collection Time: 01/31/20  9:16 AM  Result Value Ref Range   Glucose 201 (H) 65 - 99 mg/dL   BUN 15 8 - 27 mg/dL   Creatinine, Ser 1.50 (H) 0.57 - 1.00 mg/dL   GFR calc non Af Amer 46 (L) >59 mL/min/1.73   GFR calc Af Amer 53 (L) >59 mL/min/1.73    Comment: **In accordance with recommendations from the NKF-ASN Task force,**   [facility] is in the process of updating its eGFR calculation to the   2021 CKD-EPI creatinine equation that estimates kidney  function   without a race variable.    BUN/Creatinine Ratio 12 12 - 28   Sodium 139 134 - 144 mmol/L   Potassium 4.6 3.5 - 5.2 mmol/L   Chloride 102 96 - 106 mmol/L   CO2 27 20 - 29 mmol/L   Calcium 9.1 8.7 - 10.3 mg/dL  CUP PACEART REMOTE DEVICE CHECK     Status: None   Collection Time: 02/21/20  4:37 AM  Result Value Ref Range   Date Time Interrogation Session  [id number]    Pulse Generator Manufacturer MERM    Pulse Gen Model [id number] Evera MRI XT DR    Pulse Gen Serial Number [id number]    Clinic Name [facility]    Implantable Pulse Generator Type Implantable Cardiac Defibulator    Implantable Pulse Generator Implant Date [id number]    Implantable Lead Manufacturer [facility]    Implantable Lead Model 5076 CapSureFix Novus    Implantable Lead Serial Number [id number]    Implantable Lead Implant Date [id number]    Implantable Lead Location [id number]    Implantable Lead Manufacturer [facility]    Implantable Lead Model 6935 Sprint Quattro Secure S    Implantable Lead Serial Number [id number]    Implantable Lead Implant Date [id number]    Implantable Lead Location [id number]    Lead Channel Setting Sensing Sensitivity 0.3 mV   Lead Channel Setting Pacing Amplitude 2 V   Lead Channel Setting Pacing Pulse Width 0.4 ms   Lead Channel Setting Pacing Amplitude 2.5 V   Lead Channel Impedance Value 456 ohm   Lead Channel Sensing Intrinsic Amplitude 0.25 mV   Lead Channel Sensing Intrinsic Amplitude 0.25 mV   Lead Channel Pacing Threshold Amplitude 0.5 V   Lead Channel Pacing Threshold Pulse Width 0.4 ms   Lead Channel Impedance Value 456 ohm   Lead Channel Impedance Value 380 ohm   Lead Channel Sensing Intrinsic Amplitude 14 mV   Lead Channel Sensing Intrinsic Amplitude 14 mV   Lead Channel Pacing Threshold Amplitude 0.75 V   Lead Channel Pacing Threshold Pulse Width 0.4 ms   HighPow Impedance 77 ohm   Battery Status OK    Battery Remaining Longevity 80 mo   Battery Voltage 2.99 V   Brady Statistic RA Percent Paced 94.15 %   Brady Statistic RV Percent Paced 1.08 %   Brady Statistic AP VP Percent 0.76 %   Brady Statistic AS VP Percent 0.05 %   Brady Statistic AP VS Percent 94.6 %   Brady Statistic AS VS Percent 4.59 %  POCT glycosylated hemoglobin (Hb A1C)     Status: Abnormal   Collection Time: 03/06/20  9:26 AM  Result Value Ref Range   Hemoglobin A1C  8.5 (A) 4.0 - 5.6 %   HbA1c POC (<> result, manual entry)     HbA1c, POC (prediabetic range)     HbA1c, POC (controlled diabetic range)      Objective: General: Patient is awake, alert, and oriented x 3 and in no acute distress.  Integument: Skin is warm, dry and supple bilateral. Nails are tender, long, thickened and dystrophic with subungual debris, consistent with onychomycosis, 1-5 bilateral. No signs of infection. + callus hallux, sub 3-4, and 5th toes bilateral. Remaining integument unremarkable.  Vasculature:  Dorsalis Pedis pulse 1/4 bilateral. Posterior Tibial pulse  1/4 bilateral. Capillary fill time <3 sec 1-5 bilateral. Scant hair growth to the level of the digits.Temperature gradient within normal limits. No varicosities present bilateral. No edema present bilateral.  Neurology: The patient has intact sensation measured with a 5.07/10g Semmes Weinstein Monofilament at all pedal sites bilateral . Vibratory sensation diminished bilateral with tuning fork. No Babinski sign present bilateral.   Musculoskeletal:  Asymptomatic pes planus, bunion and hammertoe pedal deformities noted bilateral. Muscular strength 5/5 in all lower extremity muscular groups bilateral without pain on range of motion . No tenderness with calf compression bilateral.  Assessment and Plan: Problem List Items Addressed This Visit      Endocrine   Type 2 diabetes mellitus with diabetic neuropathy, with long-term current use of insulin ([place])    Other Visit Diagnoses    Pain due to onychomycosis of nail    -  Primary   Corns and callosities       Pes planus of both feet       Hammer toes of both feet       Peripheral arterial disease ([place])         -Examined patient. -Re-Educated patient on diabetic foot care, especially with regards to the vascular, neurological and musculoskeletal systems.  -Mechanically debrided callus >5 using sterile chisel blade and debrided all nails 1-5 bilateral using sterile nail  nipper and filed with dremel without incident and no additional charge -Continue with daily skin emollients for callus areas like previous' Ultra cream -Patient to return  in 3 months for at risk foot care -Patient advised to call the office if any problems or questions arise in the meantime.  Landis Martins, DPM

## 2020-03-25 ENCOUNTER — Ambulatory Visit (INDEPENDENT_AMBULATORY_CARE_PROVIDER_SITE_OTHER): Payer: Medicare HMO

## 2020-03-25 ENCOUNTER — Other Ambulatory Visit: Payer: Self-pay | Admitting: *Deleted

## 2020-03-25 DIAGNOSIS — IMO0002 Reserved for concepts with insufficient information to code with codable children: Secondary | ICD-10-CM

## 2020-03-25 DIAGNOSIS — E1140 Type 2 diabetes mellitus with diabetic neuropathy, unspecified: Secondary | ICD-10-CM

## 2020-03-25 DIAGNOSIS — Z95810 Presence of automatic (implantable) cardiac defibrillator: Secondary | ICD-10-CM

## 2020-03-25 DIAGNOSIS — I5022 Chronic systolic (congestive) heart failure: Secondary | ICD-10-CM | POA: Diagnosis not present

## 2020-03-26 MED ORDER — BD PEN NEEDLE SHORT U/F 31G X 8 MM MISC: 9 refills | Status: DC

## 2020-03-27 NOTE — Progress Notes (Signed)
EPIC Encounter for ICM Monitoring  Patient Name: [name] is a 69 y.o. female Date: 03/27/2020 Primary Care Physican: Unknown [name], DO Primary Cardiologist:Croitoru Electrophysiologist:Croitoru 12/15/2021Weight: 190 - 192 lbs  Clinical Status (21-Feb-2020 to 25-Mar-2020) AT/AF                          2 Time in AT/AF             <0.1 hr/day (0.2%) Longest AT/AF   81 minutes                    Spoke with patient and reports feeling well at this time.  Denies fluid symptoms.    Optivol thoracic impedancenormal.  Prescribed dosage:  Torsemide 20 mg 1 tablet daily.   Potassium 20 mEq 1 tabletby mouth as directed.  Labs: 10/09/2019 Creatinine 1.12, BUN 20, Potassium 4.4, Sodium 142, GFR 51-58 05/04/2019 Creatinine 1.23, BUN 12, Potassium 3.7, Sodium 141, GFR 45-52 A complete set of results can be found in Results Review.  Recommendations:No changes and encouraged to call if experiencing any fluid symptoms.  Follow-up plan: ICM clinic phone appointment on1/17/2022 91 day device clinic remote transmission2/12/2020.   Next office visit scheduled:04/08/2020 for Dr Royann Shivers.  Copy of ICM check sent to Dr.Croitoru.   3 month ICM trend: 03/25/2020    1 Year ICM trend:       [name], RN 03/27/2020 10:35 AM

## 2020-03-28 NOTE — Progress Notes (Signed)
Thanks

## 2020-04-08 ENCOUNTER — Ambulatory Visit (INDEPENDENT_AMBULATORY_CARE_PROVIDER_SITE_OTHER): Payer: Medicare HMO | Admitting: Cardiovascular Disease

## 2020-04-08 ENCOUNTER — Encounter: Payer: Self-pay | Admitting: Cardiovascular Disease

## 2020-04-08 ENCOUNTER — Other Ambulatory Visit: Payer: Self-pay

## 2020-04-08 VITALS — BP 130/72 | HR 86 | Ht 66.0 in | Wt 194.2 lb

## 2020-04-08 DIAGNOSIS — I5022 Chronic systolic (congestive) heart failure: Secondary | ICD-10-CM | POA: Diagnosis not present

## 2020-04-08 DIAGNOSIS — Z79899 Other long term (current) drug therapy: Secondary | ICD-10-CM | POA: Diagnosis not present

## 2020-04-08 DIAGNOSIS — Z5181 Encounter for therapeutic drug level monitoring: Secondary | ICD-10-CM

## 2020-04-08 MED ORDER — POTASSIUM CHLORIDE CRYS ER 20 MEQ PO TBCR
EXTENDED_RELEASE_TABLET | ORAL | 11 refills | Status: DC
Start: 2020-04-08 — End: 2021-03-20

## 2020-04-08 NOTE — Progress Notes (Signed)
Patient ID: [name], female   DOB: Jun 28, 1950, 69 y.o.   MRN: [id number] Patient ID: [name], female   DOB: 1950/05/06, 69 y.o.   MRN: [id number]    Cardiology Office Note    Date:  04/08/2020   ID:  [name], DOB 28-Aug-1950, MRN [id number]  PCP:  Unknown [name], DO  Cardiologist:   Thurmon Fair, MD   Chief Complaint  Patient presents with  . Irregular Heart Beat    History of Present Illness:  [name] is a 69 y.o. female with a long-standing history of nonischemic cardiomyopathy, chronic systolic heart failure and paroxysmal atrial fibrillation.    Feels well.  No cardiovascular complaints.The patient specifically denies any chest pain at rest exertion, dyspnea at rest or with exertion, orthopnea, paroxysmal nocturnal dyspnea, syncope, palpitations, focal neurological deficits, intermittent claudication, lower extremity edema, unexplained weight gain, cough, hemoptysis or wheezing.  Her ECG shows frequent PVCs (4 of them on the 10-second strip), but while reviewing her defibrillator PVCs were actually fairly infrequent.  She has not had any episodes of VT since her last device check.  She has had fairly frequent, but usually brief episodes of atrial flutter/atrial fibrillation (typically regular atrial rates around 300 bpm with 3-4: 1 AV conduction).  These often last for just a minute or so, but she has had episodes lasting up to 4 hours.  Ventricular rate control is consistently good.  She is on anticoagulation.  She is unaware of the arrhythmia.  Device function is otherwise normal and she has almost always atrial paced-ventricular sensed rhythm (96% of the time).  Ventricular pacing occurs less than 1%.  Overall burden of atrial fibrillation is 1.3%.  She has not had episodes of high ventricular rate.  Lead parameters are good.  OptiVol had a brief deviation in October but quickly returned to normal and has been normal throughout the Thanksgiving and Christmas  holidays.  Activity has improved to about 3 hours a day.  She had antitachycardia pacing for ventricular tachycardia in December 2016. Since then a handful of episodes of nonsustained VT have been recorded, none meeting criteria for treatment. She is compliant with CPAP for obstructive sleep apnea.  She has a history of moderate nonischemic cardiomyopathy (no coronary disease by cardiac catheterization January 2012), recurrent paroxysmal atrial fibrillation status post 2 ablation procedures (Dr. Orson Aloe), with history of ventricular fibrillation arrest while on treatment with diltiazem and dofetilide and a prolonged QT interval, probably related to simultaneous quinolone therapy. She has a dual-chamber Medtronic Protecta defibrillator implanted in January 2012. In December 2016 her defibrillator recorded an episode of sustained monomorphic ventricular tachycardia. Antitachycardia pacing was unsuccessful at converting the arrhythmia, but she had a "dirty break" before defibrillator shock was delivered. The shock was aborted. Polymorphic VT lasting 2 seconds was recorded in the fall of 2017 during asthma exacerbation, but did not require intervention..  Most recent echo June 2020 showed normal LVEF 55-60%, but reduced GLS -14%. At LV angiography in 2012, EF was 15-20%.  Past Medical History:  Diagnosis Date  . Anemia   . Anxiety   . Arthritis   . Arthritis of carpometacarpal (CMC) joint of thumb 03/26/2016  . Asthma    has had multiple hospitalizations for this  . Asthma, moderate persistent 12/10/2011  . Atrial fibrillation (HCC)    ablation x 2 [facility], 01/2006, 2011.  on warfarin  . Cardiac arrest [facility]) jan 2012   in hospital for pneumonia when this  occured- occured at the hospital  . CHF (congestive heart failure) (HCC) 2012   Echo 08/08/10 by [facility]. EF 35-45%. LV systolic function moderately reduced. Moderate global hypokinesis of LV.  RV systolic function moderately reduced. Mild  MR. Trace AR.  Marland Kitchen Chronic diastolic heart failure (HCC) 01/07/2018  . CKD (chronic kidney disease) stage 2, GFR 60-89 ml/min 02/22/2012   Her cr range 1.2-1.5 since Jan 2012 after hospitalization. 10/13 Estimated Creatinine Clearance: 69.3 ml/min (by C-G formula based on Cr of 1).    . Complication of anesthesia    difficult time waking up after anesthesia  . Depression   . Diabetes mellitus    Type 2  . Diabetic neuropathy (HCC) 09/18/2011  . GASTROESOPHAGEAL REFLUX, NO ESOPHAGITIS 06/10/2006   Qualifier: Diagnosis of  By: Abundio Miu    . GERD (gastroesophageal reflux disease) 03/23/2018  . History of hiatal hernia   . HYPERCHOLESTEROLEMIA 06/10/2006   LDL 93 at 01/2011 check-  Continue pravastatin  po daily.  --discuss health modifications and possibly increaseing dose at next appt.  Cardiology- Dr little- stopped pravachol on 05/21/11- 2/2 allergies?    Marland Kitchen Hyperlipidemia   . Hypertension   . ICD (St. Jude Protecta dual-chamber),secondary prevention (VF arrest) January 2012 11/19/2012  . Iliotibial band syndrome, left 11/01/2018  . Limb pain 06/04/2008   LLE, Baker's cyst in popliteal fossa, no DVT  . Long term current use of anticoagulant therapy 08/21/2015  . Non-ischemic cardiomyopathy (HCC)    echo 08/08/10 - EF 35-45% LV and RV systolic function mod reduced  . Obesity (BMI 30-39.9) 01/04/2014  . OSA on CPAP 06/10/2006   Sleep study 02/2014 : severe apnea, corrected with nasal pillows and 8cmh2o    . Osteoarthritis of right knee 08/28/2008   Qualifier: Diagnosis of  By: Pearletha Forge MD, Vincenza Hews    . Primary localized osteoarthritis of right knee   . Sleep apnea    wears CPAP nightly  . Type 2 diabetes mellitus with diabetic neuropathy, with long-term current use of insulin (HCC) 06/10/2006             . Ventricular tachycardia (HCC) 05/22/2015  . Visit for monitoring Tikosyn therapy 08/21/2015  . Vocal cord disease     Past Surgical History:  Procedure Laterality Date  . BREAST  EXCISIONAL BIOPSY Right 1999  . BREAST LUMPECTOMY Right   . CARDIAC DEFIBRILLATOR PLACEMENT  05/02/10   Medtronic Protecta XT-DR for CHF-VT, last download 04/12/12  . CHOLECYSTECTOMY    . COLONOSCOPY    . HAMMER TOE SURGERY Right   . ICD GENERATOR CHANGEOUT N/A 09/28/2017   Procedure: ICD GENERATOR CHANGEOUT;  Surgeon: Thurmon Fair, MD;  Location: [facility];  Service: Cardiovascular;  Laterality: N/A;  . KNEE ARTHROSCOPY Right   . PAF Ablation     By Dr Sampson Goon. Now sees Dr Steele Berg [facility]  . TOTAL KNEE ARTHROPLASTY Left 09/17/2014   Procedure: TOTAL KNEE ARTHROPLASTY;  Surgeon: Salvatore Marvel, MD;  Location: [facility] OR;  Service: Orthopedics;  Laterality: Left;  pt has ICD  . TUBAL LIGATION      Outpatient Medications Prior to Visit  Medication Sig Dispense Refill  . bisoprolol (ZEBETA) 10 MG tablet TAKE 1 TABLET(10 MG) BY MOUTH TWICE DAILY 180 tablet 2  . budesonide-formoterol (SYMBICORT) 80-4.5 MCG/ACT inhaler INHALE 2 PUFFS INTO THE LUNGS IN THE MORNING AND AT BEDTIME 10.2 g 11  . buPROPion (WELLBUTRIN XL) 150 MG 24 hr tablet Take 1 tablet (150  mg total) by mouth daily. 90 tablet 3  . canagliflozin (INVOKANA) 100 MG TABS tablet Take 1 tablet (100 mg total) by mouth daily. 90 tablet 3  . dofetilide (TIKOSYN) 250 MCG capsule TAKE 1 CAPSULE(250 MCG) BY MOUTH TWICE DAILY 180 capsule 1  . ELIQUIS 5 MG TABS tablet TAKE 1 TABLET(5 MG) BY MOUTH TWICE DAILY 180 tablet 1  . famotidine (PEPCID) 20 MG tablet Take 1 tablet (20 mg total) by mouth daily. 90 tablet 3  . fluticasone (FLONASE) 50 MCG/ACT nasal spray instill 1 spray into each nostril twice a day 16 g 5  . glucose blood test strip Use as instructed 100 each 12  . Insulin Glargine (BASAGLAR KWIKPEN) 100 UNIT/ML ADMINISTER 46 UNITS UNDER THE SKIN DAILY 15 mL 1  . Insulin Pen Needle (B-D ULTRAFINE III SHORT PEN) 31G X 8 MM MISC Use as directed up to 4 times daily to check blood sugar 100 each 9  . levalbuterol (XOPENEX HFA) 45  MCG/ACT inhaler INHALE 1 TO 2 PUFFS BY MOUTH INTO THE LUNGS EVERY 4 HOURS IF NEEDED FOR WHEEZING 1 Inhaler 1  . levalbuterol (XOPENEX) 0.63 MG/3ML nebulizer solution One vial in nebulizer four times daily as needed 360 mL 5  . liraglutide (VICTOZA) 18 MG/3ML SOPN Inject 1.8 mg into the skin daily. 9 mL 3  . montelukast (SINGULAIR) 10 MG tablet TAKE 1 TABLET BY MOUTH AT BEDTIME 90 tablet 1  . polyethylene glycol powder (GLYCOLAX/MIRALAX) powder take 17GM (DISSOLVED IN WATER) by mouth once daily THREE TIMES A WEEK if needed as directed 500 g 3  . PRODIGY LANCETS 28G MISC 1 Units by Does not apply route 4 (four) times daily. 100 each 4  . rosuvastatin (CRESTOR) 10 MG tablet Take 10 mg daily 90 tablet 3  . tiZANidine (ZANAFLEX) 2 MG tablet TAKE 1 TABLET BY MOUTH AT BEDTIME FOR MUSCLE SPASM    . torsemide (DEMADEX) 20 MG tablet Take 1 tablet (20 mg total) by mouth daily. 180 tablet 1  . potassium chloride SA (K-DUR,KLOR-CON) 20 MEQ tablet Take 1 tablet (20 mEq total) by mouth daily as directed. 30 tablet 11   No facility-administered medications prior to visit.     Allergies:   Avelox [moxifloxacin hcl in nacl], Nsaids, Simvastatin, Moxifloxacin, Ace inhibitors, Jardiance [empagliflozin], and Latex   Social History   Socioeconomic History  . Marital status: Widowed    Spouse name: Not on file  . Number of children: Not on file  . Years of education: Not on file  . Highest education level: Not on file  Occupational History  . Not on file  Tobacco Use  . Smoking status: Never Smoker  . Smokeless tobacco: Never Used  Vaping Use  . Vaping Use: Never used  Substance and Sexual Activity  . Alcohol use: No  . Drug use: No  . Sexual activity: Never  Other Topics Concern  . Not on file  Social History Narrative   Not employed   Exercise- walking 1 hour daily.    Diet- eating healthy diet.       [facility]:   She is originally from [facility]. Has always lived in [place]. 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 Currently has a small dog. No bird or mold exposure.       Current Social History 04/08/2017        Patient lives with  daughter, Lafonda Mosses, in two level home 04/08/2017   Transportation: Patient has own vehicle and drives herself 04/08/2017   Important Relationships Daughter, Lafonda Mosses 04/08/2017    Pets: Shiatzu Evaristo Bury)  04/08/2017   Education / Work:  10 th grade/ None 04/08/2017   Interests / Fun: Read, do puzzles 04/08/2017   Current Stressors: None because she prays to God 04/08/2017   Religious / Personal Beliefs: Non-Denominational 04/08/2017   L. Ducatte, RN, BSN                                                                                                 Social Determinants of Health   Financial Resource Strain: Not on file  Food Insecurity: Not on file  Transportation Needs: Not on file  Physical Activity: Not on file  Stress: Not on file  Social Connections: Not on file     Family History:  The patient's family history includes Diabetes in her brother and father; Hypertension in her brother and father.   ROS:   Please see the history of present illness.    The patient specifically denies any chest pain at rest or with exertion, dyspnea at rest or with exertion, orthopnea, paroxysmal nocturnal dyspnea, syncope, palpitations, focal neurological deficits, intermittent claudication, lower extremity edema, unexplained weight gain, cough, hemoptysis or wheezing.  The patient also denies abdominal pain, nausea, vomiting, dysphagia, diarrhea, constipation, polyuria, polydipsia, dysuria, hematuria, frequency, urgency, abnormal bleeding or bruising, fever, chills, unexpected weight changes, mood swings, change in skin or hair texture, change in voice quality, auditory or visual problems, allergic reactions or rashes, new musculoskeletal complaints other than usual  "aches and pains".   PHYSICAL EXAM:   VS:  BP 130/72   Pulse 86   Ht 5\' 6"  (1.676 m)   Wt 194 lb 3.2 oz (88.1 kg)   LMP 07/26/2011   SpO2 99%   BMI 31.34 kg/m     General: Alert, oriented x3, no distress, healthy left subclavian defibrillator site. Head: no evidence of trauma, PERRL, EOMI, no exophtalmos or lid lag, no myxedema, no xanthelasma; normal ears, nose and oropharynx Neck: normal jugular venous pulsations and no hepatojugular reflux; brisk carotid pulses without delay and no carotid bruits Chest: clear to auscultation, no signs of consolidation by percussion or palpation, normal fremitus, symmetrical and full respiratory excursions Cardiovascular: normal position and quality of the apical impulse, regular rhythm, normal first and second heart sounds, no murmurs, rubs or gallops Abdomen: no tenderness or distention, no masses by palpation, no abnormal pulsatility or arterial bruits, normal bowel sounds, no hepatosplenomegaly Extremities: no clubbing, cyanosis or edema; 2+ radial, ulnar and brachial pulses bilaterally; 2+ right femoral, posterior tibial and dorsalis pedis pulses; 2+ left femoral, posterior tibial and dorsalis pedis pulses; no subclavian or femoral bruits Neurological: grossly nonfocal Psych: Normal mood and affect    Wt Readings from Last 3 Encounters:  04/08/20 194 lb 3.2 oz (88.1 kg)  03/14/20 193 lb 6.4 oz (87.7 kg)  03/06/20 193 lb (87.5 kg)      Studies/Labs Reviewed:   ECHO 09/19/2018  1. The left ventricle has  normal systolic function, with an ejection fraction of 55-60%. The cavity size was normal. There is mild asymmetric left ventricular hypertrophy. Left ventricular diastolic Doppler parameters are consistent with impaired  relaxation. No evidence of left ventricular regional wall motion abnormalities.  2. The average left ventricular global longitudinal strain is 14.9 %.  3. The right ventricle has normal systolic function. The cavity was  normal. There is no increase in right ventricular wall thickness.  4. Left atrial size was mildly dilated.  5. The aortic valve is tricuspid. Mild sclerosis of the aortic valve. Aortic valve regurgitation is trivial by color flow Doppler.   EKG:  EKG is ordered today.  It shows atrial paced, ventricular sensed rhythm and an occasional PVC.  Appropriately prolonged QTC 481 ms on dofetilide.  Recent Labs: 05/04/2019: Hemoglobin 13.5; Platelets 240 10/09/2019: Magnesium 2.1 01/31/2020: BUN 15; Creatinine, Ser 1.21; Potassium 4.6; Sodium 139   Lipid Panel    Component Value Date/Time   CHOL 113 01/31/2020 0916   TRIG 93 01/31/2020 0916   HDL 46 01/31/2020 0916   CHOLHDL 2.5 01/31/2020 0916   CHOLHDL 2.4 04/21/2016 1438   VLDL 15 04/21/2016 1438   LDLCALC 49 01/31/2020 0916   LDLDIRECT 94 12/07/2007 2019     ASSESSMENT:    1. Chronic systolic heart failure (HCC)   2. Encounter for monitoring dofetilide therapy    1. VT: None recorded since her last download, but she has frequent PVCs.  These may be related to the use of beta 1 agonist bronchodilators, but also to borderline low potassium; resume her potassium supplement.  Despite marked improvement in left ventricular systolic function she has had ventricular tachycardia that required device intervention.  She is also at risk of developing torsades while in treatment with dofetilide.  ICD is appropriate. 2. PAF: Overall burden of atrial fibrillation remains low at 0.3 %.  Asymptomatic and well rate controlled.  On appropriate anticoagulation.  CHA2DS2-VASc 4 (CHF, gender, age, hypertension, diabetes mellitus 3. CHF: Clinically euvolemic on a very low-dose of loop diuretic.  NYHA functional class I today.  OptiVol has been within desirable range for the last couple of months (after a brief deviation in October).  Dry weight is probably 195 pounds or less. 4. ICD: Normal device function.  Continue remote downloads every 3  months. 5. Anticoagulation: Denies falls, injuries, bleeding complications. 6. HTN: Well-controlled 7. HLP: All lipid parameters in desirable range on statin.  LDL 49. 8. Obesity: She has been successful in efforts to lose weight.  Remains mildly obese. 9. OSA: Uses CPAP 100%.  Denies daytime hypersomnolence. 10. Tikosyn: QTc 457 ms today.  Recheck ECG and labs every 6 months. 11. Asthma: Tolerates bisoprolol well.  Other beta-blockers worsened wheezing in the past. 12. DM: Control still not at target with A1c 8.5%. 13. CKD: Stable renal function, GFR 50-60.  Most recent creatinine 1.21.   For some reason her insurance charges more for 10 mg bisoprolol tabs (nonformulary) than it does for 25 mg.  PLAN:  In order of problems listed above:  Fascinating. Have not seen one there before. Medication Adjustments/Labs and Tests Ordered: Current medicines are reviewed at length with the patient today.  Concerns regarding medicines are outlined above.  Medication changes, Labs and Tests ordered today are listed in the Patient Instructions below. Patient Instructions  Medication Instructions:  No changes *If you need a refill on your cardiac medications before your next appointment, please call your pharmacy*   Lab Work: Your  provider would like for you to return in May 2022 to have the following labs drawn: CBC and BMET. You do not need an appointment for the lab. Once in our office lobby there is a podium where you can sign in and ring the doorbell to alert [place] that you are here. The lab is open from 8:00 am to 4:30 pm; closed for lunch from 12:45pm-1:45pm.  If you have labs (blood work) drawn today and your tests are completely normal, you will receive your results only by: Marland Kitchen MyChart Message (if you have MyChart) OR . A paper copy in the mail If you have any lab test that is abnormal or we need to change your treatment, we will call you to review the results.   Testing/Procedures: None  ordered   Follow-Up: At [facility], you and your health needs are our priority.  As part of our continuing mission to provide you with exceptional heart care, we have created designated Provider Care Teams.  These Care Teams include your primary Cardiologist (physician) and Advanced Practice Providers (APPs -  Physician Assistants and Nurse Practitioners) who all work together to provide you with the care you need, when you need it.  We recommend signing up for the patient portal called "MyChart".  Sign up information is provided on this After Visit Summary.  MyChart is used to connect with patients for Virtual Visits (Telemedicine).  Patients are able to view lab/test results, encounter notes, upcoming appointments, etc.  Non-urgent messages can be sent to your provider as well.   To learn more about what you can do with MyChart, go to [email or web address].    Your next appointment:   6 month(s)  The format for your next appointment:   In Person  Provider:   Thurmon Fair, MD          Signed, Thurmon Fair, MD  04/08/2020 8:46 AM    [facility] [address] [place], [place], [place]  [address] Phone: [phone]; Fax: [phone]

## 2020-04-08 NOTE — Patient Instructions (Signed)
Medication Instructions:  No changes *If you need a refill on your cardiac medications before your next appointment, please call your pharmacy*   Lab Work: Your provider would like for you to return in May 2022 to have the following labs drawn: CBC and BMET. You do not need an appointment for the lab. Once in our office lobby there is a podium where you can sign in and ring the doorbell to alert [place] that you are here. The lab is open from 8:00 am to 4:30 pm; closed for lunch from 12:45pm-1:45pm.  If you have labs (blood work) drawn today and your tests are completely normal, you will receive your results only by: Marland Kitchen MyChart Message (if you have MyChart) OR . A paper copy in the mail If you have any lab test that is abnormal or we need to change your treatment, we will call you to review the results.   Testing/Procedures: None ordered   Follow-Up: At [facility], you and your health needs are our priority.  As part of our continuing mission to provide you with exceptional heart care, we have created designated Provider Care Teams.  These Care Teams include your primary Cardiologist (physician) and Advanced Practice Providers (APPs -  Physician Assistants and Nurse Practitioners) who all work together to provide you with the care you need, when you need it.  We recommend signing up for the patient portal called "MyChart".  Sign up information is provided on this After Visit Summary.  MyChart is used to connect with patients for Virtual Visits (Telemedicine).  Patients are able to view lab/test results, encounter notes, upcoming appointments, etc.  Non-urgent messages can be sent to your provider as well.   To learn more about what you can do with MyChart, go to [email or web address].    Your next appointment:   6 month(s)  The format for your next appointment:   In Person  Provider:   Thurmon Fair, MD

## 2020-04-09 DIAGNOSIS — J45909 Unspecified asthma, uncomplicated: Secondary | ICD-10-CM | POA: Diagnosis not present

## 2020-04-09 DIAGNOSIS — J449 Chronic obstructive pulmonary disease, unspecified: Secondary | ICD-10-CM | POA: Diagnosis not present

## 2020-04-15 DIAGNOSIS — I509 Heart failure, unspecified: Secondary | ICD-10-CM | POA: Diagnosis not present

## 2020-04-15 DIAGNOSIS — G4733 Obstructive sleep apnea (adult) (pediatric): Secondary | ICD-10-CM | POA: Diagnosis not present

## 2020-04-15 DIAGNOSIS — J449 Chronic obstructive pulmonary disease, unspecified: Secondary | ICD-10-CM | POA: Diagnosis not present

## 2020-04-15 DIAGNOSIS — R5381 Other malaise: Secondary | ICD-10-CM | POA: Diagnosis not present

## 2020-04-15 DIAGNOSIS — J45909 Unspecified asthma, uncomplicated: Secondary | ICD-10-CM | POA: Diagnosis not present

## 2020-04-16 DIAGNOSIS — I509 Heart failure, unspecified: Secondary | ICD-10-CM | POA: Diagnosis not present

## 2020-04-16 DIAGNOSIS — Z794 Long term (current) use of insulin: Secondary | ICD-10-CM | POA: Diagnosis not present

## 2020-04-16 DIAGNOSIS — J449 Chronic obstructive pulmonary disease, unspecified: Secondary | ICD-10-CM | POA: Diagnosis not present

## 2020-04-16 DIAGNOSIS — I4891 Unspecified atrial fibrillation: Secondary | ICD-10-CM | POA: Diagnosis not present

## 2020-04-16 DIAGNOSIS — E261 Secondary hyperaldosteronism: Secondary | ICD-10-CM | POA: Diagnosis not present

## 2020-04-16 DIAGNOSIS — D6859 Other primary thrombophilia: Secondary | ICD-10-CM | POA: Diagnosis not present

## 2020-04-16 DIAGNOSIS — R69 Illness, unspecified: Secondary | ICD-10-CM | POA: Diagnosis not present

## 2020-04-16 DIAGNOSIS — I471 Supraventricular tachycardia: Secondary | ICD-10-CM | POA: Diagnosis not present

## 2020-04-16 DIAGNOSIS — D6869 Other thrombophilia: Secondary | ICD-10-CM | POA: Diagnosis not present

## 2020-04-16 DIAGNOSIS — I130 Hypertensive heart and chronic kidney disease with heart failure and stage 1 through stage 4 chronic kidney disease, or unspecified chronic kidney disease: Secondary | ICD-10-CM | POA: Diagnosis not present

## 2020-04-17 ENCOUNTER — Telehealth: Payer: Self-pay | Admitting: Pulmonary Disease

## 2020-04-17 NOTE — Telephone Encounter (Signed)
Montelukast is generic so should not be very expensive. But, if this is not covered and she finds this expensive then she can discontinue this since her asthma is well controlled on Symbicort alone She can use Zyrtec 10 mg for seasonal allergies during spring and fall

## 2020-04-17 NOTE — Telephone Encounter (Signed)
Says Singulair is not covered with her insurance , wants an alternative  Sent to Dr. Vassie Loll  For recs.

## 2020-04-17 NOTE — Telephone Encounter (Signed)
Spoke with pt, aware of recs.  Nothing further needed at this time- will close encounter.   

## 2020-04-18 ENCOUNTER — Other Ambulatory Visit: Payer: Self-pay

## 2020-04-18 ENCOUNTER — Other Ambulatory Visit: Payer: Self-pay | Admitting: Family Medicine

## 2020-04-18 DIAGNOSIS — E1140 Type 2 diabetes mellitus with diabetic neuropathy, unspecified: Secondary | ICD-10-CM

## 2020-04-18 DIAGNOSIS — Z794 Long term (current) use of insulin: Secondary | ICD-10-CM

## 2020-04-18 MED ORDER — BASAGLAR KWIKPEN 100 UNIT/ML ~~LOC~~ SOPN: PEN_INJECTOR | SUBCUTANEOUS | 1 refills | Status: DC

## 2020-04-18 MED ORDER — DAPAGLIFLOZIN PROPANEDIOL 5 MG PO TABS: 5.0000 mg | ORAL_TABLET | Freq: Every day | ORAL | 1 refills | Status: DC

## 2020-04-18 NOTE — Telephone Encounter (Signed)
Rx sent for insulin with 50u and rx sent for farxiga for insurance coverage.  D/c invokana.

## 2020-04-18 NOTE — Progress Notes (Signed)
Change in insurance.  Allergy to jardiance.  Will order farxiga.

## 2020-04-18 NOTE — Telephone Encounter (Signed)
Patient calls nurse line for (2) reasons. Patient reports Theodis Sato is no longer preferred with her insurance and needs an alternative. (2) patient needs a new prescription for Basaglar reflecting 50 units daily. Please advise.

## 2020-04-22 ENCOUNTER — Other Ambulatory Visit: Payer: Self-pay | Admitting: Cardiovascular Disease

## 2020-04-22 ENCOUNTER — Other Ambulatory Visit: Payer: Self-pay | Admitting: *Deleted

## 2020-04-22 DIAGNOSIS — G4733 Obstructive sleep apnea (adult) (pediatric): Secondary | ICD-10-CM | POA: Diagnosis not present

## 2020-04-22 DIAGNOSIS — I509 Heart failure, unspecified: Secondary | ICD-10-CM | POA: Diagnosis not present

## 2020-04-22 DIAGNOSIS — J45909 Unspecified asthma, uncomplicated: Secondary | ICD-10-CM | POA: Diagnosis not present

## 2020-04-22 DIAGNOSIS — J449 Chronic obstructive pulmonary disease, unspecified: Secondary | ICD-10-CM | POA: Diagnosis not present

## 2020-04-22 DIAGNOSIS — R5381 Other malaise: Secondary | ICD-10-CM | POA: Diagnosis not present

## 2020-04-22 MED ORDER — BUPROPION HCL ER (XL) 150 MG PO TB24: 150.0000 mg | ORAL_TABLET | Freq: Every day | ORAL | 3 refills | Status: DC

## 2020-04-29 ENCOUNTER — Ambulatory Visit (INDEPENDENT_AMBULATORY_CARE_PROVIDER_SITE_OTHER): Payer: Medicare HMO

## 2020-04-29 DIAGNOSIS — I5022 Chronic systolic (congestive) heart failure: Secondary | ICD-10-CM

## 2020-04-29 DIAGNOSIS — Z95810 Presence of automatic (implantable) cardiac defibrillator: Secondary | ICD-10-CM

## 2020-04-29 NOTE — Progress Notes (Signed)
TY

## 2020-04-29 NOTE — Progress Notes (Signed)
EPIC Encounter for ICM Monitoring  Patient Name: [name] is a 70 y.o. female Date: 04/29/2020 Primary Care Physican: Unknown [name], DO Primary Cardiologist:Croitoru Electrophysiologist:Croitoru 12/15/2021Weight: 190 - 192lbs  Clinical Status (08-Apr-2020 to 29-Apr-2020) AT/AF 53 episodes Time in AT/AF   0.3 hr/day (1.2%)   Spoke with patient and reports feeling well at this time.  Denies fluid symptoms.  She thinks she drank too much water for the last few days.   Optivol thoracic impedancenormal.  Prescribed dosage:  Torsemide 20 mg 1 tablet daily.   Potassium 20 mEq 1 tabletby mouth as directed.  Labs: 01/30/2021 Creatinine 1.21, BUN 15, Potassium 4.6, Sodium 139, GFR 46-53 10/09/2019 Creatinine 1.12, BUN 20, Potassium 4.4, Sodium 142, GFR 51-58 05/04/2019 Creatinine 1.23, BUN 12, Potassium 3.7, Sodium 141, GFR 45-52 A complete set of results can be found in Results Review.  Recommendations:No changes and encouraged to call if experiencing any fluid symptoms.  Follow-up plan: ICM clinic phone appointment on2/21/2022 91 day device clinic remote transmission2/12/2020.   Next office visit scheduled:Recall 10/05/2020 for Dr Royann Shivers.  Copy of ICM check sent to Dr.Croitoru.    3 month ICM trend: 04/29/2020.    1 Year ICM trend:       [name], RN 04/29/2020 11:06 AM

## 2020-04-30 NOTE — Progress Notes (Unsigned)
    SUBJECTIVE:   CHIEF COMPLAINT / HPI:   T2DM Current regimen:Victoza 1.8 mg, Basaglar 50 units.  Had to change from invokana 100mg  to farxiga on 1/6 due to insurance Doing okay with this change CBGs "perfect in the morning" Last A1c 8.5 on 11/24 At last visit Basaglar was increased from 46 to 50 units nightly Last BMP 01/31/2020, creatinine stable, electrolytes WNL On statin, last lipid panel on 01/31/2020, LDL 49 Has been intolerant of metformin and ACE/ARB in the past Eye exam 12/09/2019 and told everything was fine  Chronic diastolic heart failure Last echo on 09/19/2018, EF 55 to 60% Last seen by Dr. 11/19/2018 on 12/27  Ventricular tachycardia ICD in place, managed by cardiology per above  Paroxysmal atrial fibrillation A. fib burden is low per last cardiology note on 12/27 CHA2DS2-VASc of 4 On Tikosyn and Eliquis  Chronic osteoarthritis of right knee Uses tylenol and voltaren gel as needed for pain She is asking for a prescription for this  COVID booster on 02/09/2020  PERTINENT  PMH / PSH: HTN, ventricular tachycardia, chronic diastolic heart failure, asthma, OSA on CPAP, GERD, T2DM, HLD, ICD in place, PAF on Tikosyn and Eliquis  OBJECTIVE:   BP 110/80   Pulse 90   Ht 5\' 6"  (1.676 m)   Wt 193 lb 8 oz (87.8 kg)   LMP 07/26/2011   SpO2 99%   BMI 31.23 kg/m    Physical Exam:  General: 70 y.o. female in NAD Lungs: Breathing comfortably on RA Skin: warm and dry Extremities: Ambulating without difficulty Psych: mood and affect appropriate for circumstance    ASSESSMENT/PLAN:   Type 2 diabetes mellitus with diabetic neuropathy, with long-term current use of insulin (HCC) Patient reports CBGs have been well controlled.  She just changed to 07/28/2011 from [place] about a week ago.  Given this, will wait 1 month, bring her back, assess CBGs, get A1c, and obtain BMP at that time.  Continue with her current regimen at present.  Intolerant of metformin and ACE/ARB in  the past.  Currently on statin therapy and LDL within goal.  Eye exam not in chart, but up-to-date per patient.  Chronic diastolic heart failure (HCC) Doing well.  Seen by cardiology on 12/27.  Continue to follow with cardiology.  On torsemide 20 mg daily, potassium, bisoprolol.  Ventricular tachycardia (HCC) ICD in place.  Follows with cardiology.  Last seen on 12/27.  Stable.  PAF (paroxysmal atrial fibrillation) (HCC) On Tikosyn and Eliquis.  Followed by cardiology.  Doing well and now with low A. fib burden per their last note.  Pain in both lower extremities Rx provided for Voltaren gel and Tylenol to use as needed for pain.     1/28, DO [facility]

## 2020-05-01 ENCOUNTER — Ambulatory Visit (INDEPENDENT_AMBULATORY_CARE_PROVIDER_SITE_OTHER): Payer: Medicare HMO | Admitting: Family Medicine

## 2020-05-01 ENCOUNTER — Encounter: Payer: Self-pay | Admitting: Family Medicine

## 2020-05-01 ENCOUNTER — Other Ambulatory Visit: Payer: Self-pay

## 2020-05-01 VITALS — BP 110/80 | HR 90 | Ht 66.0 in | Wt 193.5 lb

## 2020-05-01 DIAGNOSIS — M79605 Pain in left leg: Secondary | ICD-10-CM

## 2020-05-01 DIAGNOSIS — Z794 Long term (current) use of insulin: Secondary | ICD-10-CM | POA: Diagnosis not present

## 2020-05-01 DIAGNOSIS — M79604 Pain in right leg: Secondary | ICD-10-CM | POA: Diagnosis not present

## 2020-05-01 DIAGNOSIS — I472 Ventricular tachycardia, unspecified: Secondary | ICD-10-CM

## 2020-05-01 DIAGNOSIS — I480 Paroxysmal atrial fibrillation: Secondary | ICD-10-CM

## 2020-05-01 DIAGNOSIS — E1140 Type 2 diabetes mellitus with diabetic neuropathy, unspecified: Secondary | ICD-10-CM

## 2020-05-01 DIAGNOSIS — I5032 Chronic diastolic (congestive) heart failure: Secondary | ICD-10-CM

## 2020-05-01 MED ORDER — ACETAMINOPHEN ER 650 MG PO TBCR: 650.0000 mg | EXTENDED_RELEASE_TABLET | Freq: Three times a day (TID) | ORAL | 2 refills | Status: AC | PRN

## 2020-05-01 MED ORDER — DICLOFENAC SODIUM 1 % EX GEL: 2.0000 g | Freq: Four times a day (QID) | CUTANEOUS | 1 refills | Status: DC

## 2020-05-01 NOTE — Assessment & Plan Note (Signed)
ICD in place.  Follows with cardiology.  Last seen on 12/27.  Stable.

## 2020-05-01 NOTE — Patient Instructions (Signed)
Thank you for coming to see me today. It was a pleasure. Today we talked about:   Everything looks good today!  Please follow-up with me in 1 month (end of February).  We will get labs then  If you have any questions or concerns, please do not hesitate to call the office at [phone].  Best,   Luis Abed, DO

## 2020-05-01 NOTE — Assessment & Plan Note (Addendum)
Patient reports CBGs have been well controlled.  She just changed to [place] from [place] about a week ago.  Given this, will wait 1 month, bring her back, assess CBGs, get A1c, and obtain BMP at that time.  Continue with her current regimen at present.  Intolerant of metformin and ACE/ARB in the past.  Currently on statin therapy and LDL within goal.  Eye exam not in chart, but up-to-date per patient.

## 2020-05-01 NOTE — Assessment & Plan Note (Signed)
Doing well.  Seen by cardiology on 12/27.  Continue to follow with cardiology.  On torsemide 20 mg daily, potassium, bisoprolol.

## 2020-05-01 NOTE — Assessment & Plan Note (Signed)
On Tikosyn and Eliquis.  Followed by cardiology.  Doing well and now with low A. fib burden per their last note.

## 2020-05-01 NOTE — Assessment & Plan Note (Signed)
Rx provided for Voltaren gel and Tylenol to use as needed for pain.

## 2020-05-20 DIAGNOSIS — G4733 Obstructive sleep apnea (adult) (pediatric): Secondary | ICD-10-CM | POA: Diagnosis not present

## 2020-05-20 DIAGNOSIS — J45909 Unspecified asthma, uncomplicated: Secondary | ICD-10-CM | POA: Diagnosis not present

## 2020-05-20 DIAGNOSIS — J449 Chronic obstructive pulmonary disease, unspecified: Secondary | ICD-10-CM | POA: Diagnosis not present

## 2020-05-22 ENCOUNTER — Ambulatory Visit (INDEPENDENT_AMBULATORY_CARE_PROVIDER_SITE_OTHER): Payer: Medicare HMO

## 2020-05-22 DIAGNOSIS — I472 Ventricular tachycardia, unspecified: Secondary | ICD-10-CM

## 2020-05-22 DIAGNOSIS — I5022 Chronic systolic (congestive) heart failure: Secondary | ICD-10-CM | POA: Diagnosis not present

## 2020-05-23 DIAGNOSIS — J449 Chronic obstructive pulmonary disease, unspecified: Secondary | ICD-10-CM | POA: Diagnosis not present

## 2020-05-23 DIAGNOSIS — G4733 Obstructive sleep apnea (adult) (pediatric): Secondary | ICD-10-CM | POA: Diagnosis not present

## 2020-05-23 DIAGNOSIS — J45909 Unspecified asthma, uncomplicated: Secondary | ICD-10-CM | POA: Diagnosis not present

## 2020-05-23 DIAGNOSIS — I509 Heart failure, unspecified: Secondary | ICD-10-CM | POA: Diagnosis not present

## 2020-05-23 DIAGNOSIS — R5381 Other malaise: Secondary | ICD-10-CM | POA: Diagnosis not present

## 2020-05-23 LAB — CUP PACEART REMOTE DEVICE CHECK
Battery Remaining Longevity: 69 mo
Battery Voltage: 2.99 V
Brady Statistic AP VP Percent: 2.98 %
Brady Statistic AP VS Percent: 89.59 %
Brady Statistic AS VP Percent: 0.25 %
Brady Statistic AS VS Percent: 7.17 %
Brady Statistic RA Percent Paced: 89.58 %
Brady Statistic RV Percent Paced: 3.28 %
Date Time Interrogation Session: 20220209033623
HighPow Impedance: 80 Ohm
Implantable Lead Implant Date: 20120120
Implantable Lead Implant Date: 20120120
Implantable Lead Location: 753859
Implantable Lead Location: 753860
Implantable Lead Model: 5076
Implantable Lead Model: 6935
Implantable Pulse Generator Implant Date: 20190618
Lead Channel Impedance Value: 342 Ohm
Lead Channel Impedance Value: 399 Ohm
Lead Channel Impedance Value: 437 Ohm
Lead Channel Pacing Threshold Amplitude: 0.375 V
Lead Channel Pacing Threshold Amplitude: 0.625 V
Lead Channel Pacing Threshold Pulse Width: 0.4 ms
Lead Channel Pacing Threshold Pulse Width: 0.4 ms
Lead Channel Sensing Intrinsic Amplitude: 1.25 mV
Lead Channel Sensing Intrinsic Amplitude: 1.25 mV
Lead Channel Sensing Intrinsic Amplitude: 13.375 mV
Lead Channel Sensing Intrinsic Amplitude: 13.375 mV
Lead Channel Setting Pacing Amplitude: 2 V
Lead Channel Setting Pacing Amplitude: 2.5 V
Lead Channel Setting Pacing Pulse Width: 0.4 ms
Lead Channel Setting Sensing Sensitivity: 0.3 mV

## 2020-05-27 NOTE — Progress Notes (Signed)
Remote ICD transmission.   

## 2020-05-31 NOTE — Progress Notes (Signed)
    SUBJECTIVE:   CHIEF COMPLAINT / HPI:   T2DM Current regimen: Victoza 1.8 mg, Basaglar 50 units QAM, Farxiga 5mg  CBGs 111 this AM, 89-140 per her report Last A1c 8.5 on 11/24 On statin, LDL at goal on last check on 01/31/2020 History of ACE (cough) and Metformin intolerance Up-to-date on eye exam, 12/09/2019  CKD Stage 3a Last GFR 53 on 01/31/2020 Does not follow with Nephrology  PERTINENT  PMH / PSH: HTN, PAF on Tikosyn and Eliquis, ventricular tachycardia, high 5, asthma, OSA on CPAP, GERD, T2DM, HLD  OBJECTIVE:   BP 120/80   Pulse 89   Wt 195 lb 12.8 oz (88.8 kg)   LMP 07/26/2011   SpO2 97%   BMI 31.60 kg/m    Physical Exam:  General: 70 y.o. female in NAD  Lungs: Breathing comfortably on room air Skin: warm and dry Extremities: No edema  Results for orders placed or performed in visit on 06/04/20 (from the past 24 hour(s))  HgB A1c     Status: Abnormal   Collection Time: 06/04/20  1:55 PM  Result Value Ref Range   Hemoglobin A1C 7.8 (A) 4.0 - 5.6 %   HbA1c POC (<> result, manual entry)     HbA1c, POC (prediabetic range)     HbA1c, POC (controlled diabetic range)     *Note: Due to a large number of results and/or encounters for the requested time period, some results have not been displayed. A complete set of results can be found in Results Review.      ASSESSMENT/PLAN:   Type 2 diabetes mellitus with diabetic neuropathy, with long-term current use of insulin (HCC) A1c today improved to 7.8.  She is on max dose of Victoza.  Her goal is still to have her knee replaced and needs an A1c of 7.5 or less.  Will increase Farxiga to 10 mg daily.  Patient had to change from 06/06/20 to [place] on 1/16 due to her insurance.  We will obtain a BMP today.  Given BP is on lower side, would not attempt ARB therapy at this time, but could consider in the future.  She will need a urine microalbumin at her next visit.  Follow-up in 1 month.  Chronic renal impairment, stage  3a (HCC) No need for nephrology referral at this time.  On farxiga.  Will obtain urine microalbumin at next visit.  BMP today.  Discussed diagnosis with patient.     2/16, DO [facility]

## 2020-06-03 ENCOUNTER — Ambulatory Visit (INDEPENDENT_AMBULATORY_CARE_PROVIDER_SITE_OTHER): Payer: Medicare HMO

## 2020-06-03 DIAGNOSIS — Z95810 Presence of automatic (implantable) cardiac defibrillator: Secondary | ICD-10-CM | POA: Diagnosis not present

## 2020-06-03 DIAGNOSIS — I5022 Chronic systolic (congestive) heart failure: Secondary | ICD-10-CM | POA: Diagnosis not present

## 2020-06-04 ENCOUNTER — Ambulatory Visit (INDEPENDENT_AMBULATORY_CARE_PROVIDER_SITE_OTHER): Payer: Medicare HMO | Admitting: Family Medicine

## 2020-06-04 ENCOUNTER — Other Ambulatory Visit: Payer: Self-pay

## 2020-06-04 ENCOUNTER — Encounter: Payer: Self-pay | Admitting: Family Medicine

## 2020-06-04 VITALS — BP 120/80 | HR 89 | Wt 195.8 lb

## 2020-06-04 DIAGNOSIS — N1831 Chronic kidney disease, stage 3a: Secondary | ICD-10-CM | POA: Diagnosis not present

## 2020-06-04 DIAGNOSIS — E1140 Type 2 diabetes mellitus with diabetic neuropathy, unspecified: Secondary | ICD-10-CM

## 2020-06-04 DIAGNOSIS — Z794 Long term (current) use of insulin: Secondary | ICD-10-CM | POA: Diagnosis not present

## 2020-06-04 LAB — POCT GLYCOSYLATED HEMOGLOBIN (HGB A1C): Hemoglobin A1C: 7.8 % — AB (ref 4.0–5.6)

## 2020-06-04 MED ORDER — VICTOZA 18 MG/3ML ~~LOC~~ SOPN: 1.8000 mg | PEN_INJECTOR | Freq: Every day | SUBCUTANEOUS | 3 refills | Status: DC

## 2020-06-04 MED ORDER — DAPAGLIFLOZIN PROPANEDIOL 10 MG PO TABS: 10.0000 mg | ORAL_TABLET | Freq: Every day | ORAL | 1 refills | Status: DC

## 2020-06-04 MED ORDER — POLYETHYLENE GLYCOL 3350 17 GM/SCOOP PO POWD: ORAL | 3 refills | Status: DC

## 2020-06-04 NOTE — Assessment & Plan Note (Signed)
No need for nephrology referral at this time.  On farxiga.  Will obtain urine microalbumin at next visit.  BMP today.  Discussed diagnosis with patient.

## 2020-06-04 NOTE — Patient Instructions (Signed)
Thank you for coming to see me today. It was a pleasure. Today we talked about:   We will get some labs today.  If they are abnormal or we need to do something about them, I will call you.  If they are normal, I will send you a message on MyChart (if it is active) or a letter in the mail.  If you don't hear from [place] in 2 weeks, please call the office at the number below.  We will increase your farxiga to 10mg .    Please follow-up with me in 1 month.  If you have any questions or concerns, please do not hesitate to call the office at [phone].  Best,   [phone], DO

## 2020-06-04 NOTE — Assessment & Plan Note (Addendum)
A1c today improved to 7.8.  She is on max dose of Victoza.  Her goal is still to have her knee replaced and needs an A1c of 7.5 or less.  Will increase Farxiga to 10 mg daily.  Patient had to change from [place] to [place] on 1/16 due to her insurance.  We will obtain a BMP today.  Given BP is on lower side, would not attempt ARB therapy at this time, but could consider in the future.  She will need a urine microalbumin at her next visit.  Follow-up in 1 month.

## 2020-06-05 LAB — BASIC METABOLIC PANEL
BUN/Creatinine Ratio: 16 (ref 12–28)
BUN: 21 mg/dL (ref 8–27)
CO2: 23 mmol/L (ref 20–29)
Calcium: 8.9 mg/dL (ref 8.7–10.3)
Chloride: 102 mmol/L (ref 96–106)
Creatinine, Ser: 1.28 mg/dL — ABNORMAL HIGH (ref 0.57–1.00)
GFR calc Af Amer: 49 mL/min/{1.73_m2} — ABNORMAL LOW (ref >59)
GFR calc non Af Amer: 43 mL/min/{1.73_m2} — ABNORMAL LOW (ref >59)
Glucose: 109 mg/dL — ABNORMAL HIGH (ref 65–99)
Potassium: 4.3 mmol/L (ref 3.5–5.2)
Sodium: 141 mmol/L (ref 134–144)

## 2020-06-12 NOTE — Progress Notes (Signed)
EPIC Encounter for ICM Monitoring  Patient Name: [name] is a 70 y.o. female Date: 06/12/2020 Primary Care Physican: Unknown [name], DO Primary Cardiologist:Croitoru Electrophysiologist:Croitoru 06/12/2020 Weight: 190- 192lbs  Clinical Status (22-May-2020 to 03-Jun-2020) AT/AF 57 episodes  Time in AT/AF 1.2 hr/day (5.1%)(Increased from 1.2% from 04/08/2020)   Spoke with patient and reports feeling well at this time. Denies fluid symptoms.   Optivol thoracic impedancenormal.  Prescribed dosage:  Torsemide 20 mg 1 tablet daily.   Potassium 20 mEq 1 tabletby mouth as directed.  Labs: 01/30/2021 Creatinine 1.21, BUN 15, Potassium 4.6, Sodium 139, GFR 46-53 10/09/2019 Creatinine 1.12, BUN 20, Potassium 4.4, Sodium 142, GFR 51-58 05/04/2019 Creatinine 1.23, BUN 12, Potassium 3.7, Sodium 141, GFR 45-52 A complete set of results can be found in Results Review.  Recommendations:No changes and encouraged to call if experiencing any fluid symptoms.  Follow-up plan: ICM clinic phone appointment on4/4/202291 day device clinic remote transmission5/02/2021.   Next office visit scheduled:Recall 10/05/2020 for Dr Royann Shivers.  Copy of ICM check sent to Dr.Croitoru.  3 month ICM trend: 06/03/2020.    1 Year ICM trend:       [name], RN 06/12/2020 4:11 PM

## 2020-06-17 DIAGNOSIS — J449 Chronic obstructive pulmonary disease, unspecified: Secondary | ICD-10-CM | POA: Diagnosis not present

## 2020-06-17 DIAGNOSIS — G4733 Obstructive sleep apnea (adult) (pediatric): Secondary | ICD-10-CM | POA: Diagnosis not present

## 2020-06-17 DIAGNOSIS — J45909 Unspecified asthma, uncomplicated: Secondary | ICD-10-CM | POA: Diagnosis not present

## 2020-06-20 ENCOUNTER — Ambulatory Visit (INDEPENDENT_AMBULATORY_CARE_PROVIDER_SITE_OTHER): Payer: Medicare HMO | Admitting: Sports Medicine

## 2020-06-20 ENCOUNTER — Other Ambulatory Visit: Payer: Self-pay | Admitting: Family Medicine

## 2020-06-20 ENCOUNTER — Encounter: Payer: Self-pay | Admitting: Sports Medicine

## 2020-06-20 ENCOUNTER — Other Ambulatory Visit: Payer: Self-pay

## 2020-06-20 DIAGNOSIS — Z794 Long term (current) use of insulin: Secondary | ICD-10-CM

## 2020-06-20 DIAGNOSIS — E1140 Type 2 diabetes mellitus with diabetic neuropathy, unspecified: Secondary | ICD-10-CM

## 2020-06-20 DIAGNOSIS — B351 Tinea unguium: Secondary | ICD-10-CM

## 2020-06-20 DIAGNOSIS — M79609 Pain in unspecified limb: Secondary | ICD-10-CM | POA: Diagnosis not present

## 2020-06-20 DIAGNOSIS — L84 Corns and callosities: Secondary | ICD-10-CM | POA: Diagnosis not present

## 2020-06-20 DIAGNOSIS — J449 Chronic obstructive pulmonary disease, unspecified: Secondary | ICD-10-CM | POA: Diagnosis not present

## 2020-06-20 DIAGNOSIS — J45909 Unspecified asthma, uncomplicated: Secondary | ICD-10-CM | POA: Diagnosis not present

## 2020-06-20 DIAGNOSIS — I509 Heart failure, unspecified: Secondary | ICD-10-CM | POA: Diagnosis not present

## 2020-06-20 DIAGNOSIS — G4733 Obstructive sleep apnea (adult) (pediatric): Secondary | ICD-10-CM | POA: Diagnosis not present

## 2020-06-20 DIAGNOSIS — R5381 Other malaise: Secondary | ICD-10-CM | POA: Diagnosis not present

## 2020-06-20 NOTE — Progress Notes (Signed)
Subjective: [name] is a 70 y.o. female patient with history of diabetes who presents to office today complaining of long,mildly painful nails and callus while ambulating in shoes; unable to trim. Patient states that she is doing good still on Victoza due to previous increase in blood sugar.  No other issues noted.   Patient Active Problem List   Diagnosis Date Noted  . Chronic renal impairment, stage 3a ([place]) 06/04/2020  . Pain in both lower extremities 05/01/2020  . Yeast vaginitis 01/31/2020  . Iliotibial band syndrome, left 11/01/2018  . GERD (gastroesophageal reflux disease) 03/23/2018  . Chronic diastolic heart failure ([place]) 01/07/2018  . Mild obesity 01/07/2018  . Arthritis of carpometacarpal (CMC) joint of thumb 03/26/2016  . Primary localized osteoarthritis of right knee   . Visit for monitoring Tikosyn therapy 08/21/2015  . Long term current use of anticoagulant therapy 08/21/2015  . Ventricular tachycardia ([place]) 05/22/2015  . Obesity (BMI 30-39.9) 01/04/2014  . ICD (St. Jude Protecta dual-chamber),secondary prevention (VF arrest) January 2012 11/19/2012  . Asthma, moderate persistent 12/10/2011  . Diabetic neuropathy ([place]) 09/18/2011  . Osteoarthritis of right knee 08/28/2008  . Type 2 diabetes mellitus with diabetic neuropathy, with long-term current use of insulin ([place]) 06/10/2006  . HYPERCHOLESTEROLEMIA 06/10/2006  . Essential hypertension 06/10/2006  . PAF (paroxysmal atrial fibrillation) ([place]) 06/10/2006  . OSA on CPAP 06/10/2006   Current Outpatient Medications on File Prior to Visit  Medication Sig Dispense Refill  . acetaminophen (TYLENOL 8 HOUR) 650 MG CR tablet Take 1 tablet (650 mg total) by mouth every 8 (eight) hours as needed for pain. 90 tablet 2  . bisoprolol (ZEBETA) 10 MG tablet TAKE 1 TABLET(10 MG) BY MOUTH TWICE DAILY 180 tablet 2  . budesonide-formoterol (SYMBICORT) 80-4.5 MCG/ACT inhaler INHALE 2 PUFFS INTO THE LUNGS IN THE MORNING AND AT  BEDTIME 10.2 g 11  . buPROPion (WELLBUTRIN XL) 150 MG 24 hr tablet Take 1 tablet (150 mg total) by mouth daily. 90 tablet 3  . dapagliflozin propanediol (FARXIGA) 10 MG TABS tablet Take 1 tablet (10 mg total) by mouth daily before breakfast. 90 tablet 1  . diclofenac Sodium (VOLTAREN) 1 % GEL Apply 2 g topically 4 (four) times daily. 100 g 1  . dofetilide (TIKOSYN) 250 MCG capsule TAKE 1 CAPSULE(250 MCG) BY MOUTH TWICE DAILY 180 capsule 1  . ELIQUIS 5 MG TABS tablet TAKE 1 TABLET(5 MG) BY MOUTH TWICE DAILY 180 tablet 1  . famotidine (PEPCID) 20 MG tablet Take 1 tablet (20 mg total) by mouth daily. 90 tablet 3  . fluticasone (FLONASE) 50 MCG/ACT nasal spray instill 1 spray into each nostril twice a day 16 g 5  . glucose blood test strip Use as instructed 100 each 12  . Insulin Glargine (BASAGLAR KWIKPEN) 100 UNIT/ML ADMINISTER 50 UNITS UNDER THE SKIN DAILY 15 mL 1  . Insulin Pen Needle (B-D ULTRAFINE III SHORT PEN) 31G X 8 MM MISC Use as directed up to 4 times daily to check blood sugar 100 each 9  . levalbuterol (XOPENEX HFA) 45 MCG/ACT inhaler INHALE 1 TO 2 PUFFS BY MOUTH INTO THE LUNGS EVERY 4 HOURS IF NEEDED FOR WHEEZING 1 Inhaler 1  . levalbuterol (XOPENEX) 0.63 MG/3ML nebulizer solution One vial in nebulizer four times daily as needed 360 mL 5  . liraglutide (VICTOZA) 18 MG/3ML SOPN Inject 1.8 mg into the skin daily. 9 mL 3  . montelukast (SINGULAIR) 10 MG tablet TAKE 1 TABLET BY MOUTH AT BEDTIME 90  tablet 1  . polyethylene glycol powder (GLYCOLAX/MIRALAX) 17 GM/SCOOP powder take 17GM (DISSOLVED IN WATER) by mouth once daily THREE TIMES A WEEK if needed as directed 500 g 3  . potassium chloride SA (KLOR-CON) 20 MEQ tablet Take 1 tablet (20 mEq total) by mouth daily as directed. 30 tablet 11  . PRODIGY LANCETS 28G MISC 1 Units by Does not apply route 4 (four) times daily. 100 each 4  . rosuvastatin (CRESTOR) 10 MG tablet Take 10 mg daily 90 tablet 3  . tiZANidine (ZANAFLEX) 2 MG tablet TAKE 1  TABLET BY MOUTH AT BEDTIME FOR MUSCLE SPASM    . torsemide (DEMADEX) 20 MG tablet Take 1 tablet (20 mg total) by mouth daily. 180 tablet 1   No current facility-administered medications on file prior to visit.   Allergies  Allergen Reactions  . Avelox [Moxifloxacin Hcl In Nacl] Other (See Comments)    Cardiac arrest per pt  . Nsaids Other (See Comments)    Cardiac arrest per pt Cardiac arrest per pt  . Simvastatin Other (See Comments)    myalgias myalgias myalgias  . Moxifloxacin Other (See Comments)  . Ace Inhibitors Other (See Comments) and Cough    Dry cough  Dry cough  Dry cough   . Jardiance [Empagliflozin] Other (See Comments)    Felt "crazy", fatigue, sweating, denies hypoglycemia while taking  . Latex Rash    Recent Results (from the past 2160 hour(s))  CUP PACEART REMOTE DEVICE CHECK     Status: None   Collection Time: 05/22/20  3:36 AM  Result Value Ref Range   Date Time Interrogation Session [id number]    Pulse Generator Manufacturer MERM    Pulse Gen Model [id number] Evera MRI XT DR    Pulse Gen Serial Number [id number]    Clinic Name [facility]    Implantable Pulse Generator Type Implantable Cardiac Defibulator    Implantable Pulse Generator Implant Date 92119417    Implantable Lead Manufacturer [facility]    Implantable Lead Model 5076 CapSureFix Novus    Implantable Lead Serial Number [id number]    Implantable Lead Implant Date 40814481    Implantable Lead Location [id number]    Implantable Lead Manufacturer [facility]    Implantable Lead Model 6935 Sprint Quattro Secure S    Implantable Lead Serial Number [id number]    Implantable Lead Implant Date 85631497    Implantable Lead Location [id number]    Lead Channel Setting Sensing Sensitivity 0.3 mV   Lead Channel Setting Pacing Amplitude 2 V   Lead Channel Setting Pacing Pulse Width 0.4 ms   Lead Channel Setting Pacing Amplitude 2.5 V   Lead Channel Impedance Value 399 ohm   Lead Channel Sensing Intrinsic Amplitude  1.25 mV   Lead Channel Sensing Intrinsic Amplitude 1.25 mV   Lead Channel Pacing Threshold Amplitude 0.375 V   Lead Channel Pacing Threshold Pulse Width 0.4 ms   Lead Channel Impedance Value 437 ohm   Lead Channel Impedance Value 342 ohm   Lead Channel Sensing Intrinsic Amplitude 13.375 mV   Lead Channel Sensing Intrinsic Amplitude 13.375 mV   Lead Channel Pacing Threshold Amplitude 0.625 V   Lead Channel Pacing Threshold Pulse Width 0.4 ms   HighPow Impedance 80 ohm   Battery Status OK    Battery Remaining Longevity 69 mo   Battery Voltage 2.99 V   Brady Statistic RA Percent Paced 89.58 %   Brady Statistic RV Percent Paced 3.28 %   Brady Statistic AP VP Percent  2.98 %   Brady Statistic AS VP Percent 0.25 %   Brady Statistic AP VS Percent 89.59 %   Brady Statistic AS VS Percent 7.17 %  HgB A1c     Status: Abnormal   Collection Time: 06/04/20  1:55 PM  Result Value Ref Range   Hemoglobin A1C 7.8 (A) 4.0 - 5.6 %   HbA1c POC (<> result, manual entry)     HbA1c, POC (prediabetic range)     HbA1c, POC (controlled diabetic range)    Basic Metabolic Panel     Status: Abnormal   Collection Time: 06/04/20  2:10 PM  Result Value Ref Range   Glucose 109 (H) 65 - 99 mg/dL   BUN 21 8 - 27 mg/dL   Creatinine, Ser 1.28 (H) 0.57 - 1.00 mg/dL    Comment:                **Effective June 10, 2020 [facility] will begin**                  reporting the 2021 CKD-EPI creatinine equation that                  estimates kidney function without a race variable.    GFR calc non Af Amer 43 (L) >59 mL/min/1.73   GFR calc Af Amer 49 (L) >59 mL/min/1.73    Comment: **In accordance with recommendations from the NKF-ASN Task force,**   [facility] is in the process of updating its eGFR calculation to the   2021 CKD-EPI creatinine equation that estimates kidney function   without a race variable.    BUN/Creatinine Ratio 16 12 - 28   Sodium 141 134 - 144 mmol/L   Potassium 4.3 3.5 - 5.2 mmol/L   Chloride  102 96 - 106 mmol/L   CO2 23 20 - 29 mmol/L   Calcium 8.9 8.7 - 10.3 mg/dL    Objective: General: Patient is awake, alert, and oriented x 3 and in no acute distress.  Integument: Skin is warm, dry and supple bilateral. Nails are tender, long, thickened and dystrophic with subungual debris, consistent with onychomycosis, 1-5 bilateral. No signs of infection. + callus hallux, sub 3-4, and 5th toes bilateral. Remaining integument unremarkable.  Vasculature:  Dorsalis Pedis pulse 1/4 bilateral. Posterior Tibial pulse  1/4 bilateral. Capillary fill time <3 sec 1-5 bilateral. Scant hair growth to the level of the digits.Temperature gradient within normal limits. No varicosities present bilateral. No edema present bilateral.   Neurology: The patient has intact sensation measured with a 5.07/10g Semmes Weinstein Monofilament at all pedal sites bilateral . Vibratory sensation diminished bilateral with tuning fork. No Babinski sign present bilateral.   Musculoskeletal:  Asymptomatic pes planus, bunion and hammertoe pedal deformities noted bilateral. Muscular strength 5/5 in all lower extremity muscular groups bilateral without pain on range of motion . No tenderness with calf compression bilateral.  Assessment and Plan: Problem List Items Addressed This Visit      Endocrine   Type 2 diabetes mellitus with diabetic neuropathy, with long-term current use of insulin ([place])    Other Visit Diagnoses    Pain due to onychomycosis of nail    -  Primary   Corns and callosities         -Examined patient. -Re-Educated patient on diabetic foot care, especially with regards to the vascular, neurological and musculoskeletal systems.  -Mechanically debrided callus >5 using sterile chisel blade and debrided all nails 1-5 bilateral using sterile nail nipper  and filed with dremel without incident and no additional charge -Continue with daily skin emollients for callus areas -Patient to return  in 3 months for at  risk foot care -Patient advised to call the office if any problems or questions arise in the meantime.  Landis Martins, DPM

## 2020-06-25 ENCOUNTER — Other Ambulatory Visit: Payer: Self-pay

## 2020-06-25 ENCOUNTER — Ambulatory Visit (INDEPENDENT_AMBULATORY_CARE_PROVIDER_SITE_OTHER): Payer: Medicare HMO | Admitting: Family Medicine

## 2020-06-25 VITALS — BP 120/65 | HR 86 | Ht 66.0 in | Wt 192.2 lb

## 2020-06-25 DIAGNOSIS — H9202 Otalgia, left ear: Secondary | ICD-10-CM

## 2020-06-25 DIAGNOSIS — E119 Type 2 diabetes mellitus without complications: Secondary | ICD-10-CM | POA: Diagnosis not present

## 2020-06-25 NOTE — Patient Instructions (Signed)
Thank you for coming in to see [place] today! Please see below to review our plan for today's visit:  You have a sore in your left ear. This could be due to a small pimple, a bug bite, or even from the mask strap pressing your ear forward. Either way, it does not look infected.  1. Please apply Bacitracin topical to the ear twice daily. 2. Follow up on March 31st with Dr. Obie Dredge. If the wound is not any better or looks worse we may need to do a biopsy.    Please call the clinic at [phone] if your symptoms worsen or you have any concerns. It was our pleasure to serve you!   Dr. Peggyann Shoals [facility]

## 2020-06-25 NOTE — Progress Notes (Signed)
    SUBJECTIVE:   CHIEF COMPLAINT / HPI:   Ear pain, left:  Patient reports she started having Left ear soreness about 2 weeks ago. Feels like there is a sore spot inside of the ear. Tylenol made the pain a little better, also applied neosporin but this was not helpful. Pain is 8/10, the same as when it started. No fevers, body aches, chills, nasal congestion, sneezing, runny nose, or rashes. Her hearing has not changed. Denies any triggers that started the pain, no accidents or traumas.   She does wear glasses and a mask, however says this does not change her pain.  PERTINENT  PMH / PSH:  Patient Active Problem List   Diagnosis Date Noted  . Left ear pain 06/25/2020  . Chronic renal impairment, stage 3a (HCC) 06/04/2020  . Pain in both lower extremities 05/01/2020  . Yeast vaginitis 01/31/2020  . Iliotibial band syndrome, left 11/01/2018  . GERD (gastroesophageal reflux disease) 03/23/2018  . Chronic diastolic heart failure (HCC) 01/07/2018  . Mild obesity 01/07/2018  . Arthritis of carpometacarpal (CMC) joint of thumb 03/26/2016  . Primary localized osteoarthritis of right knee   . Visit for monitoring Tikosyn therapy 08/21/2015  . Long term current use of anticoagulant therapy 08/21/2015  . Ventricular tachycardia (HCC) 05/22/2015  . Obesity (BMI 30-39.9) 01/04/2014  . ICD (St. Jude Protecta dual-chamber),secondary prevention (VF arrest) January 2012 11/19/2012  . Asthma, moderate persistent 12/10/2011  . Diabetic neuropathy (HCC) 09/18/2011  . Osteoarthritis of right knee 08/28/2008  . Type 2 diabetes mellitus with diabetic neuropathy, with long-term current use of insulin (HCC) 06/10/2006  . HYPERCHOLESTEROLEMIA 06/10/2006  . Essential hypertension 06/10/2006  . PAF (paroxysmal atrial fibrillation) (HCC) 06/10/2006  . OSA on CPAP 06/10/2006     OBJECTIVE:   BP 120/65   Pulse 86   Ht 5\' 6"  (1.676 m)   Wt 192 lb 3.2 oz (87.2 kg)   LMP 07/26/2011   SpO2 99%   BMI  31.02 kg/m    Physical exam: General: Well-appearing patient, no apparent distress Ear: 4 x 5 mm lesion appreciated to patient's right ear; normal-appearing tympanic membranes without cerumen impaction, bulging, erythema (see photo below)     ASSESSMENT/PLAN:   Left ear pain Secondary to left ear lesion appreciated on patient's antihelix. Lesion is 4x47mm, appears to be healing. Is white in the photo but not pearly, no telangiectasias or ulceration appreciated.  Is most likely due to comedonal or bug bite that patient scratched.  However, would like to monitor for other neoplastic lesion. -Patient given bacitracin to apply to the ear twice daily -Can continue to take Tylenol 2-3 times daily as needed for pain -Patient asked to follow-up in 2-3 weeks for surveillance of the lesion (patient has follow-up appointment 3/31 with her PCP).  If lesion is not improved or looks worse may need biopsy.     4/31, DO [facility]

## 2020-06-25 NOTE — Assessment & Plan Note (Signed)
Secondary to left ear lesion appreciated on patient's antihelix. Lesion is 4x33mm, appears to be healing. Is white in the photo but not pearly, no telangiectasias or ulceration appreciated.  Is most likely due to comedonal or bug bite that patient scratched.  However, would like to monitor for other neoplastic lesion. -Patient given bacitracin to apply to the ear twice daily -Can continue to take Tylenol 2-3 times daily as needed for pain -Patient asked to follow-up in 2-3 weeks for surveillance of the lesion (patient has follow-up appointment 3/31 with her PCP).  If lesion is not improved or looks worse may need biopsy.

## 2020-07-02 ENCOUNTER — Telehealth: Payer: Self-pay | Admitting: Cardiovascular Disease

## 2020-07-02 NOTE — Telephone Encounter (Signed)
*  STAT* If patient is at the pharmacy, call can be transferred to refill team.   1. Which medications need to be refilled? (please list name of each medication and dose if known)  dofetilide (TIKOSYN) 250 MCG capsule  2. Which pharmacy/location (including street and city if local pharmacy) is medication to be sent to?  [id number] - [place], [place] - [address] AT [facility] [address]  3. Do they need a 30 day or 90 day supply? 90

## 2020-07-03 MED ORDER — DOFETILIDE 250 MCG PO CAPS: ORAL_CAPSULE | ORAL | 3 refills | Status: DC

## 2020-07-10 NOTE — Progress Notes (Signed)
    SUBJECTIVE:   CHIEF COMPLAINT / HPI:   T2DM Current regimen: Victoza 1.8 mg, Basaglar 50 units every morning, Farxiga 10 mg daily, Farxiga increased on 2/22 CBGs 133 this AM, highest has been about 150 in the AM, lowest has been 80 a few weeks ago Last A1c on 2/22 was 7.8 Last BMP on 2/22, creatinine stable at 1.28, GFR 49 Goal of A1c of 7.5 or less for knee replacement History of ACE cough and Metformin intolerance On statin, last LDL at goal on 01/31/2020 She had an eye exam in August  Ear lesion Last seen on 3/15 At that time prescribed bacitracin to use twice daily Has been using, improving Occasionally itches, but not much  PERTINENT  PMH / PSH: PAF on Tikosyn and Eliquis, history of ventricular tachycardia, HFpEF, asthma, OSA on CPAP, T2DM with neuropathy, OA of right knee, CKD 3a, HLD, ICD in place  OBJECTIVE:   BP 132/86   Pulse 85   Ht 5\' 6"  (1.676 m)   Wt 193 lb 9.6 oz (87.8 kg)   LMP 07/26/2011   SpO2 95%   BMI 31.25 kg/m    Physical Exam:  General: 70 y.o. female in NAD HEENT: well healing hyperpigmented lesion on left antihelix superior border, see image below Cardio: RRR no m/r/g Lungs: CTAB, no wheezing, no rhonchi, no crackles, no IWOB on RA Skin: warm and dry Extremities: No edema       ASSESSMENT/PLAN:   Type 2 diabetes mellitus with diabetic neuropathy, with long-term current use of insulin (HCC) CBGs improving.  With increase in 78 and history of CKD3a, will obtain BMP today.  In order to get patient to A1c of 7.5, will need slightly better control and fasting sugars in the AM.  Discussed with patient that if CBGs consistently >120 and not having lows, can increase to 52u, if still meeting these paramenters, can increase up to 54u before her next appointment.  Continue current regimen otherwise.  On statin.  Would not want to drop BP too much, but eventually would like to get her on an ARB.  F/U May for repeat A1c.  Left ear  pain Signficantly improving.  Given this, can continue with bacitracin.  If worsens again or not completely healed by her next visit in May, would consider Derm referral.  For now, she has improved significantly and can continue with plan.     June, DO [facility]

## 2020-07-11 ENCOUNTER — Ambulatory Visit (INDEPENDENT_AMBULATORY_CARE_PROVIDER_SITE_OTHER): Payer: Medicare HMO | Admitting: Family Medicine

## 2020-07-11 ENCOUNTER — Other Ambulatory Visit: Payer: Self-pay

## 2020-07-11 ENCOUNTER — Encounter: Payer: Self-pay | Admitting: Family Medicine

## 2020-07-11 VITALS — BP 132/86 | HR 85 | Ht 66.0 in | Wt 193.6 lb

## 2020-07-11 DIAGNOSIS — Z794 Long term (current) use of insulin: Secondary | ICD-10-CM | POA: Diagnosis not present

## 2020-07-11 DIAGNOSIS — E1140 Type 2 diabetes mellitus with diabetic neuropathy, unspecified: Secondary | ICD-10-CM

## 2020-07-11 DIAGNOSIS — H9202 Otalgia, left ear: Secondary | ICD-10-CM | POA: Diagnosis not present

## 2020-07-11 NOTE — Assessment & Plan Note (Signed)
Signficantly improving.  Given this, can continue with bacitracin.  If worsens again or not completely healed by her next visit in May, would consider Derm referral.  For now, she has improved significantly and can continue with plan.

## 2020-07-11 NOTE — Assessment & Plan Note (Signed)
CBGs improving.  With increase in [place] and history of CKD3a, will obtain BMP today.  In order to get patient to A1c of 7.5, will need slightly better control and fasting sugars in the AM.  Discussed with patient that if CBGs consistently >120 and not having lows, can increase to 52u, if still meeting these paramenters, can increase up to 54u before her next appointment.  Continue current regimen otherwise.  On statin.  Would not want to drop BP too much, but eventually would like to get her on an ARB.  F/U May for repeat A1c.

## 2020-07-11 NOTE — Patient Instructions (Addendum)
Thank you for coming to see me today. It was a pleasure. Today we talked about:   We will get some labs today.  If they are abnormal or we need to do something about them, I will call you.  If they are normal, I will send you a message on MyChart (if it is active) or a letter in the mail.  If you don't hear from [place] in 2 weeks, please call the office at the number below.  Continue to use the ointment on your ear.  If it gets worse again, come back right away.  If you are consistently seeing sugars in the morning >120, go ahead and increase your Basaglar to 52 units. You can do this again up to 54 units if you aren't having lows and are consistently >120.  Please follow-up with me in May.  If you have any questions or concerns, please do not hesitate to call the office at [phone].  Best,   Luis Abed, DO

## 2020-07-12 LAB — BASIC METABOLIC PANEL
BUN/Creatinine Ratio: 12 (ref 12–28)
BUN: 16 mg/dL (ref 8–27)
CO2: 21 mmol/L (ref 20–29)
Calcium: 9.1 mg/dL (ref 8.7–10.3)
Chloride: 101 mmol/L (ref 96–106)
Creatinine, Ser: 1.31 mg/dL — ABNORMAL HIGH (ref 0.57–1.00)
Glucose: 181 mg/dL — ABNORMAL HIGH (ref 65–99)
Potassium: 4.8 mmol/L (ref 3.5–5.2)
Sodium: 139 mmol/L (ref 134–144)
eGFR: 44 mL/min/{1.73_m2} — ABNORMAL LOW (ref >59)

## 2020-07-14 DIAGNOSIS — G4733 Obstructive sleep apnea (adult) (pediatric): Secondary | ICD-10-CM | POA: Diagnosis not present

## 2020-07-14 DIAGNOSIS — J449 Chronic obstructive pulmonary disease, unspecified: Secondary | ICD-10-CM | POA: Diagnosis not present

## 2020-07-14 DIAGNOSIS — R5381 Other malaise: Secondary | ICD-10-CM | POA: Diagnosis not present

## 2020-07-14 DIAGNOSIS — I509 Heart failure, unspecified: Secondary | ICD-10-CM | POA: Diagnosis not present

## 2020-07-14 DIAGNOSIS — J45909 Unspecified asthma, uncomplicated: Secondary | ICD-10-CM | POA: Diagnosis not present

## 2020-07-15 ENCOUNTER — Other Ambulatory Visit: Payer: Self-pay | Admitting: Pulmonary Disease

## 2020-07-16 ENCOUNTER — Encounter: Payer: Self-pay | Admitting: Cardiovascular Disease

## 2020-07-17 ENCOUNTER — Other Ambulatory Visit: Payer: Self-pay | Admitting: Cardiovascular Disease

## 2020-07-17 ENCOUNTER — Other Ambulatory Visit: Payer: Self-pay | Admitting: Family Medicine

## 2020-07-17 DIAGNOSIS — E1140 Type 2 diabetes mellitus with diabetic neuropathy, unspecified: Secondary | ICD-10-CM

## 2020-07-18 ENCOUNTER — Other Ambulatory Visit: Payer: Self-pay | Admitting: Family Medicine

## 2020-07-18 DIAGNOSIS — Z794 Long term (current) use of insulin: Secondary | ICD-10-CM

## 2020-07-18 DIAGNOSIS — E1140 Type 2 diabetes mellitus with diabetic neuropathy, unspecified: Secondary | ICD-10-CM

## 2020-07-22 ENCOUNTER — Ambulatory Visit (INDEPENDENT_AMBULATORY_CARE_PROVIDER_SITE_OTHER): Payer: Medicare HMO

## 2020-07-22 DIAGNOSIS — Z95810 Presence of automatic (implantable) cardiac defibrillator: Secondary | ICD-10-CM | POA: Diagnosis not present

## 2020-07-22 DIAGNOSIS — I5022 Chronic systolic (congestive) heart failure: Secondary | ICD-10-CM | POA: Diagnosis not present

## 2020-07-23 ENCOUNTER — Telehealth: Payer: Self-pay | Admitting: Emergency Medicine

## 2020-07-23 DIAGNOSIS — I10 Essential (primary) hypertension: Secondary | ICD-10-CM

## 2020-07-23 DIAGNOSIS — I5022 Chronic systolic (congestive) heart failure: Secondary | ICD-10-CM | POA: Diagnosis not present

## 2020-07-23 NOTE — Progress Notes (Signed)
EPIC Encounter for ICM Monitoring  Patient Name: [name] is a 70 y.o. female Date: 07/23/2020 Primary Care Physican: Diane [name], DO Primary Cardiologist:Croitoru Electrophysiologist:Croitoru 07/23/2020 Weight: 190- 192lbs  Clinical Status (03-Jun-2020 to 22-Jul-2020)) AT/AF 16 Time in AT/AF 0.1 hr/day (0.4%) Longest AT/AF 86 minutes   Spoke with patient and reports feeling well at this time. Denies fluid symptoms.  Optivol thoracic impedancenormal.  Report shows episode of VT that successfully terminated with ATP x 2 and was addressed by device clinic.   Prescribed dosage:  Torsemide 20 mg 1 tablet daily.   Potassium 20 mEq 1 tabletby mouth as directed.  Labs: 07/23/2020 Creatinine 1.11, BUN 17, Potassium 4.1, Sodium 139, GFR 54 07/11/2020 Creatinine 1.31, BUN 16, Potassium 4.8, Sodium 139, GFR 44  06/04/2020 Creatinine 1.28, BUN 21, Potassium 4.3, Sodium 141  A complete set of results can be found in Results Review.  Recommendations:No changes and encouraged to call if experiencing any fluid symptoms.  Follow-up plan: ICM clinic phone appointment on5/25/202291 day device clinic remote transmission5/02/2021.   Next office visit scheduled:7/1/2022for Dr Royann Shivers.  Copy of ICM check sent to Dr.Croitoru.  3 month ICM trend: 07/22/2020.    1 Year ICM trend:       [name], RN 07/23/2020 4:36 PM

## 2020-07-23 NOTE — Telephone Encounter (Signed)
The patient has been made aware and will come to the office today for lab work.[name]

## 2020-07-23 NOTE — Telephone Encounter (Signed)
Patient transmission reviewed from 07/22/20. 07/19/20 at 0936 she had episode of NSVT at 0936 and at 0937 an episode of VT that successfully terminated with ATP x 2. Patient reports she felt different but reports no symptoms of CP, chest pressure , SOB , dizziness, or syncope. She reports no missed doses of Zebeta 10 mg BID or Tikosyn 250 mg BID. Atrial channel appears to be undersensing with farfield waves noted. Shock plan and [place] DMV driving restrictions reviewed. ED precautions given.

## 2020-07-23 NOTE — Telephone Encounter (Signed)
Please have her check a Bmet, magnesium, BNP today.

## 2020-07-24 LAB — BASIC METABOLIC PANEL
BUN/Creatinine Ratio: 15 (ref 12–28)
BUN: 17 mg/dL (ref 8–27)
CO2: 24 mmol/L (ref 20–29)
Calcium: 8.6 mg/dL — ABNORMAL LOW (ref 8.7–10.3)
Chloride: 102 mmol/L (ref 96–106)
Creatinine, Ser: 1.11 mg/dL — ABNORMAL HIGH (ref 0.57–1.00)
Glucose: 96 mg/dL (ref 65–99)
Potassium: 4.1 mmol/L (ref 3.5–5.2)
Sodium: 139 mmol/L (ref 134–144)
eGFR: 54 mL/min/{1.73_m2} — ABNORMAL LOW (ref >59)

## 2020-07-24 LAB — BRAIN NATRIURETIC PEPTIDE: BNP: 92.1 pg/mL (ref 0.0–100.0)

## 2020-07-24 LAB — MAGNESIUM: Magnesium: 2.2 mg/dL (ref 1.6–2.3)

## 2020-07-24 NOTE — Progress Notes (Signed)
No evidence of volume overload as a cause of her VT events

## 2020-08-13 DIAGNOSIS — J449 Chronic obstructive pulmonary disease, unspecified: Secondary | ICD-10-CM | POA: Diagnosis not present

## 2020-08-13 DIAGNOSIS — R5381 Other malaise: Secondary | ICD-10-CM | POA: Diagnosis not present

## 2020-08-13 DIAGNOSIS — G4733 Obstructive sleep apnea (adult) (pediatric): Secondary | ICD-10-CM | POA: Diagnosis not present

## 2020-08-13 DIAGNOSIS — J45909 Unspecified asthma, uncomplicated: Secondary | ICD-10-CM | POA: Diagnosis not present

## 2020-08-13 DIAGNOSIS — I509 Heart failure, unspecified: Secondary | ICD-10-CM | POA: Diagnosis not present

## 2020-08-15 ENCOUNTER — Encounter: Payer: Self-pay | Admitting: Sports Medicine

## 2020-08-15 ENCOUNTER — Ambulatory Visit: Payer: Medicare HMO | Admitting: Sports Medicine

## 2020-08-15 ENCOUNTER — Other Ambulatory Visit: Payer: Self-pay

## 2020-08-15 DIAGNOSIS — M79609 Pain in unspecified limb: Secondary | ICD-10-CM

## 2020-08-15 DIAGNOSIS — E1140 Type 2 diabetes mellitus with diabetic neuropathy, unspecified: Secondary | ICD-10-CM | POA: Diagnosis not present

## 2020-08-15 DIAGNOSIS — Z794 Long term (current) use of insulin: Secondary | ICD-10-CM

## 2020-08-15 DIAGNOSIS — B351 Tinea unguium: Secondary | ICD-10-CM

## 2020-08-15 DIAGNOSIS — L84 Corns and callosities: Secondary | ICD-10-CM

## 2020-08-15 NOTE — Progress Notes (Signed)
Subjective: [name] is a 70 y.o. female patient with history of diabetes who presents to office today complaining of long,mildly painful nails and callus while ambulating in shoes; unable to trim. Patient reports that she had a recent issue with her heart and can no longer drive again. Patient reports that she on Eliquis.    FBS 111 this am A1C around 7-8  PCP visit 3 months ago   Patient Active Problem List   Diagnosis Date Noted  . Left ear pain 06/25/2020  . Chronic renal impairment, stage 3a ([place]) 06/04/2020  . Pain in both lower extremities 05/01/2020  . Iliotibial band syndrome, left 11/01/2018  . GERD (gastroesophageal reflux disease) 03/23/2018  . Chronic diastolic heart failure ([place]) 01/07/2018  . Mild obesity 01/07/2018  . Arthritis of carpometacarpal (CMC) joint of thumb 03/26/2016  . Primary localized osteoarthritis of right knee   . Visit for monitoring Tikosyn therapy 08/21/2015  . Long term current use of anticoagulant therapy 08/21/2015  . Ventricular tachycardia ([place]) 05/22/2015  . Obesity (BMI 30-39.9) 01/04/2014  . ICD (St. Jude Protecta dual-chamber),secondary prevention (VF arrest) January 2012 11/19/2012  . Asthma, moderate persistent 12/10/2011  . Diabetic neuropathy ([place]) 09/18/2011  . Osteoarthritis of right knee 08/28/2008  . Type 2 diabetes mellitus with diabetic neuropathy, with long-term current use of insulin ([place]) 06/10/2006  . HYPERCHOLESTEROLEMIA 06/10/2006  . Essential hypertension 06/10/2006  . PAF (paroxysmal atrial fibrillation) ([place]) 06/10/2006  . OSA on CPAP 06/10/2006   Current Outpatient Medications on File Prior to Visit  Medication Sig Dispense Refill  . acetaminophen (TYLENOL 8 HOUR) 650 MG CR tablet Take 1 tablet (650 mg total) by mouth every 8 (eight) hours as needed for pain. 90 tablet 2  . bisoprolol (ZEBETA) 10 MG tablet TAKE 1 TABLET(10 MG) BY MOUTH TWICE DAILY 180 tablet 2  . budesonide-formoterol (SYMBICORT) 80-4.5  MCG/ACT inhaler INHALE 2 PUFFS INTO THE LUNGS IN THE MORNING AND AT BEDTIME 10.2 g 11  . buPROPion (WELLBUTRIN XL) 150 MG 24 hr tablet Take 1 tablet (150 mg total) by mouth daily. 90 tablet 3  . dapagliflozin propanediol (FARXIGA) 10 MG TABS tablet Take 1 tablet (10 mg total) by mouth daily before breakfast. 90 tablet 1  . diclofenac Sodium (VOLTAREN) 1 % GEL Apply 2 g topically 4 (four) times daily. 100 g 1  . dofetilide (TIKOSYN) 250 MCG capsule TAKE 1 CAPSULE(250 MCG) BY MOUTH TWICE DAILY 180 capsule 3  . ELIQUIS 5 MG TABS tablet TAKE 1 TABLET(5 MG) BY MOUTH TWICE DAILY 180 tablet 1  . famotidine (PEPCID) 20 MG tablet Take 1 tablet (20 mg total) by mouth daily. 90 tablet 3  . fluticasone (FLONASE) 50 MCG/ACT nasal spray instill 1 spray into each nostril twice a day 16 g 5  . glucose blood test strip Use as instructed 100 each 12  . Insulin Glargine (BASAGLAR KWIKPEN) 100 UNIT/ML ADMINISTER 50 UNITS UNDER THE SKIN DAILY 15 mL 1  . Insulin Pen Needle (B-D ULTRAFINE III SHORT PEN) 31G X 8 MM MISC Use as directed up to 4 times daily to check blood sugar 100 each 9  . levalbuterol (XOPENEX HFA) 45 MCG/ACT inhaler INHALE 1 TO 2 PUFFS BY MOUTH INTO THE LUNGS EVERY 4 HOURS IF NEEDED FOR WHEEZING 1 Inhaler 1  . levalbuterol (XOPENEX) 0.63 MG/3ML nebulizer solution One vial in nebulizer four times daily as needed 360 mL 5  . liraglutide (VICTOZA) 18 MG/3ML SOPN Inject 1.8 mg into the skin daily.  9 mL 3  . montelukast (SINGULAIR) 10 MG tablet TAKE 1 TABLET BY MOUTH AT BEDTIME 90 tablet 1  . polyethylene glycol powder (GLYCOLAX/MIRALAX) 17 GM/SCOOP powder take 17GM (DISSOLVED IN WATER) by mouth once daily THREE TIMES A WEEK if needed as directed 500 g 3  . potassium chloride SA (KLOR-CON) 20 MEQ tablet Take 1 tablet (20 mEq total) by mouth daily as directed. 30 tablet 11  . PRODIGY LANCETS 28G MISC 1 Units by Does not apply route 4 (four) times daily. 100 each 4  . rosuvastatin (CRESTOR) 10 MG tablet TAKE 1  TABLET BY MOUTH DAILY 90 tablet 3  . tiZANidine (ZANAFLEX) 2 MG tablet TAKE 1 TABLET BY MOUTH AT BEDTIME FOR MUSCLE SPASM    . torsemide (DEMADEX) 20 MG tablet Take 1 tablet (20 mg total) by mouth daily. 180 tablet 1   No current facility-administered medications on file prior to visit.   Allergies  Allergen Reactions  . Avelox [Moxifloxacin Hcl In Nacl] Other (See Comments)    Cardiac arrest per pt  . Nsaids Other (See Comments)    Cardiac arrest per pt Cardiac arrest per pt  . Simvastatin Other (See Comments)    myalgias myalgias myalgias  . Moxifloxacin Other (See Comments)  . Ace Inhibitors Other (See Comments) and Cough    Dry cough  Dry cough  Dry cough   . Jardiance [Empagliflozin] Other (See Comments)    Felt "crazy", fatigue, sweating, denies hypoglycemia while taking  . Latex Rash    Recent Results (from the past 2160 hour(s))  CUP PACEART REMOTE DEVICE CHECK     Status: None   Collection Time: 05/22/20  3:36 AM  Result Value Ref Range   Date Time Interrogation Session [id number]    Pulse Generator Manufacturer MERM    Pulse Gen Model [id number] Evera MRI XT DR    Pulse Gen Serial Number [id number]    Clinic Name [facility]    Implantable Pulse Generator Type Implantable Cardiac Defibulator    Implantable Pulse Generator Implant Date 22297989    Implantable Lead Manufacturer [facility]    Implantable Lead Model 5076 CapSureFix Novus    Implantable Lead Serial Number [id number]    Implantable Lead Implant Date 21194174    Implantable Lead Location [id number]    Implantable Lead Manufacturer [facility]    Implantable Lead Model 6935 Sprint Quattro Secure S    Implantable Lead Serial Number [id number]    Implantable Lead Implant Date 08144818    Implantable Lead Location [id number]    Lead Channel Setting Sensing Sensitivity 0.3 mV   Lead Channel Setting Pacing Amplitude 2 V   Lead Channel Setting Pacing Pulse Width 0.4 ms   Lead Channel Setting Pacing Amplitude 2.5 V    Lead Channel Impedance Value 399 ohm   Lead Channel Sensing Intrinsic Amplitude 1.25 mV   Lead Channel Sensing Intrinsic Amplitude 1.25 mV   Lead Channel Pacing Threshold Amplitude 0.375 V   Lead Channel Pacing Threshold Pulse Width 0.4 ms   Lead Channel Impedance Value 437 ohm   Lead Channel Impedance Value 342 ohm   Lead Channel Sensing Intrinsic Amplitude 13.375 mV   Lead Channel Sensing Intrinsic Amplitude 13.375 mV   Lead Channel Pacing Threshold Amplitude 0.625 V   Lead Channel Pacing Threshold Pulse Width 0.4 ms   HighPow Impedance 80 ohm   Battery Status OK    Battery Remaining Longevity 69 mo   Battery Voltage 2.99 V   Brady Statistic RA  Percent Paced 89.58 %   Brady Statistic RV Percent Paced 3.28 %   Brady Statistic AP VP Percent 2.98 %   Brady Statistic AS VP Percent 0.25 %   Brady Statistic AP VS Percent 89.59 %   Brady Statistic AS VS Percent 7.17 %  HgB A1c     Status: Abnormal   Collection Time: 06/04/20  1:55 PM  Result Value Ref Range   Hemoglobin A1C 7.8 (A) 4.0 - 5.6 %   HbA1c POC (<> result, manual entry)     HbA1c, POC (prediabetic range)     HbA1c, POC (controlled diabetic range)    Basic Metabolic Panel     Status: Abnormal   Collection Time: 06/04/20  2:10 PM  Result Value Ref Range   Glucose 109 (H) 65 - 99 mg/dL   BUN 21 8 - 27 mg/dL   Creatinine, Ser 1.28 (H) 0.57 - 1.00 mg/dL    Comment:                **Effective June 10, 2020 [facility] will begin**                  reporting the 2021 CKD-EPI creatinine equation that                  estimates kidney function without a race variable.    GFR calc non Af Amer 43 (L) >59 mL/min/1.73   GFR calc Af Amer 49 (L) >59 mL/min/1.73    Comment: **In accordance with recommendations from the NKF-ASN Task force,**   [facility] is in the process of updating its eGFR calculation to the   2021 CKD-EPI creatinine equation that estimates kidney function   without a race variable.    BUN/Creatinine Ratio 16 12 -  28   Sodium 141 134 - 144 mmol/L   Potassium 4.3 3.5 - 5.2 mmol/L   Chloride 102 96 - 106 mmol/L   CO2 23 20 - 29 mmol/L   Calcium 8.9 8.7 - 10.3 mg/dL  Basic Metabolic Panel     Status: Abnormal   Collection Time: 07/11/20  9:10 AM  Result Value Ref Range   Glucose 181 (H) 65 - 99 mg/dL   BUN 16 8 - 27 mg/dL   Creatinine, Ser 1.31 (H) 0.57 - 1.00 mg/dL   eGFR 44 (L) >59 mL/min/1.73   BUN/Creatinine Ratio 12 12 - 28   Sodium 139 134 - 144 mmol/L   Potassium 4.8 3.5 - 5.2 mmol/L   Chloride 101 96 - 106 mmol/L   CO2 21 20 - 29 mmol/L   Calcium 9.1 8.7 - 10.3 mg/dL  Basic metabolic panel     Status: Abnormal   Collection Time: 07/23/20 10:14 AM  Result Value Ref Range   Glucose 96 65 - 99 mg/dL   BUN 17 8 - 27 mg/dL   Creatinine, Ser 1.11 (H) 0.57 - 1.00 mg/dL   eGFR 54 (L) >59 mL/min/1.73   BUN/Creatinine Ratio 15 12 - 28   Sodium 139 134 - 144 mmol/L   Potassium 4.1 3.5 - 5.2 mmol/L   Chloride 102 96 - 106 mmol/L   CO2 24 20 - 29 mmol/L   Calcium 8.6 (L) 8.7 - 10.3 mg/dL  Magnesium     Status: None   Collection Time: 07/23/20 10:14 AM  Result Value Ref Range   Magnesium 2.2 1.6 - 2.3 mg/dL  Brain natriuretic peptide     Status: None   Collection Time: 07/23/20 10:14  AM  Result Value Ref Range   BNP 92.1 0.0 - 100.0 pg/mL    Objective: General: Patient is awake, alert, and oriented x 3 and in no acute distress.  Integument: Skin is warm, dry and supple bilateral. Nails are tender, long, thickened and dystrophic with subungual debris, consistent with onychomycosis, 1-5 bilateral. No signs of infection. + callus hallux, sub 3-4, and 5th toes bilateral. Remaining integument unremarkable.  Vasculature:  Dorsalis Pedis pulse 1/4 bilateral. Posterior Tibial pulse  1/4 bilateral. Capillary fill time <3 sec 1-5 bilateral. Scant hair growth to the level of the digits.Temperature gradient within normal limits. No varicosities present bilateral. No edema present bilateral.    Neurology: The patient has slighty diminished sensation measured with a 5.07/10g Semmes Weinstein Monofilament at all pedal sites bilateral . Vibratory sensation diminished bilateral with tuning fork. No Babinski sign present bilateral.   Musculoskeletal:  Asymptomatic pes planus, bunion and hammertoe pedal deformities noted bilateral. Muscular strength 5/5 in all lower extremity muscular groups bilateral without pain on range of motion. No tenderness with calf compression bilateral.  Assessment and Plan: Problem List Items Addressed This Visit      Endocrine   Type 2 diabetes mellitus with diabetic neuropathy, with long-term current use of insulin ([place])    Other Visit Diagnoses    Pain due to onychomycosis of nail    -  Primary   Corns and callosities         -Examined patient. -Re-Educated patient on diabetic foot care, especially with regards to the vascular, neurological and musculoskeletal systems.  -Mechanically debrided callus >5 using sterile chisel blade and debrided all nails 1-5 bilateral using sterile nail nipper and filed with dremel without incident and no additional charge -Continue with daily skin emollients for callus areas like previous  -Patient to return  in 3 months for at risk foot care -Patient advised to call the office if any problems or questions arise in the meantime.  Landis Martins, DPM

## 2020-08-21 ENCOUNTER — Ambulatory Visit (INDEPENDENT_AMBULATORY_CARE_PROVIDER_SITE_OTHER): Payer: Medicare HMO

## 2020-08-21 DIAGNOSIS — I5022 Chronic systolic (congestive) heart failure: Secondary | ICD-10-CM | POA: Diagnosis not present

## 2020-08-21 DIAGNOSIS — I472 Ventricular tachycardia, unspecified: Secondary | ICD-10-CM

## 2020-08-22 LAB — CUP PACEART REMOTE DEVICE CHECK
Battery Remaining Longevity: 69 mo
Battery Voltage: 2.99 V
Brady Statistic AP VP Percent: 0.6 %
Brady Statistic AP VS Percent: 96.03 %
Brady Statistic AS VP Percent: 0.01 %
Brady Statistic AS VS Percent: 3.35 %
Brady Statistic RA Percent Paced: 95.65 %
Brady Statistic RV Percent Paced: 0.66 %
Date Time Interrogation Session: 20220511033423
HighPow Impedance: 77 Ohm
Implantable Lead Implant Date: 20120120
Implantable Lead Implant Date: 20120120
Implantable Lead Location: 753859
Implantable Lead Location: 753860
Implantable Lead Model: 5076
Implantable Lead Model: 6935
Implantable Pulse Generator Implant Date: 20190618
Lead Channel Impedance Value: 342 Ohm
Lead Channel Impedance Value: 437 Ohm
Lead Channel Impedance Value: 437 Ohm
Lead Channel Pacing Threshold Amplitude: 0.5 V
Lead Channel Pacing Threshold Amplitude: 0.75 V
Lead Channel Pacing Threshold Pulse Width: 0.4 ms
Lead Channel Pacing Threshold Pulse Width: 0.4 ms
Lead Channel Sensing Intrinsic Amplitude: 1.5 mV
Lead Channel Sensing Intrinsic Amplitude: 1.5 mV
Lead Channel Sensing Intrinsic Amplitude: 12.25 mV
Lead Channel Sensing Intrinsic Amplitude: 12.25 mV
Lead Channel Setting Pacing Amplitude: 2 V
Lead Channel Setting Pacing Amplitude: 2.5 V
Lead Channel Setting Pacing Pulse Width: 0.4 ms
Lead Channel Setting Sensing Sensitivity: 0.3 mV

## 2020-08-29 NOTE — Progress Notes (Signed)
    SUBJECTIVE:   CHIEF COMPLAINT / HPI:   T2DM Current regimen: Basaglar 54 units every morning, Farxiga 10 mg daily, Victoza 1.8 mg CBGs 135 this AM, sometimes low 100s A1c on 2/22 was 7.8, her goal is less than 7.5 in order for her knee replacement On statin, last LDL at goal 01/31/2020 History of ACE cough and metformin intolerance Have not been able to start ARB therapy due to lower blood pressures  Left ear pain Initially seen by Dr. Dareen Piano on 3/15 and was prescribed bacitracin for left ear pain and lesion By 3/31 and this had significantly improved Now resolved  PERTINENT  PMH / PSH: HTN, PAF on Tikosyn and Eliquis, HFpEF, OSA, asthma, T2DM with neuropathy, CKD 3 OA, HLD, history of V. tach with ICD in place  OBJECTIVE:   BP 130/72   Pulse 91   Ht 5\' 6"  (1.676 m)   Wt 194 lb 9.6 oz (88.3 kg)   LMP 07/26/2011   SpO2 97%   BMI 31.41 kg/m    Physical Exam:  General: 70 y.o. female in NAD Lungs: Breathing comfortably on room air Skin: warm and dry Extremities: Ambulating without difficulty   Results for orders placed or performed in visit on 08/30/20 (from the past 24 hour(s))  HgB A1c     Status: Abnormal   Collection Time: 08/30/20  8:47 AM  Result Value Ref Range   Hemoglobin A1C     HbA1c POC (<> result, manual entry)     HbA1c, POC (prediabetic range)     HbA1c, POC (controlled diabetic range) 8.3 (A) 0.0 - 7.0 %   *Note: Due to a large number of results and/or encounters for the requested time period, some results have not been displayed. A complete set of results can be found in Results Review.     ASSESSMENT/PLAN:   Type 2 diabetes mellitus with diabetic neuropathy, with long-term current use of insulin (HCC) A1c now increased from 7.8-8.3.  Patient continues have a goal of 7.5 or less for knee replacement.  We will continue to work at this.  She has titrated herself up to Basaglar 54 units in the morning and her CBGs in the morning are not  terrible, suspect that her sugars are rising throughout the day especially with her largest meal of the day.  She is resistant at this time to starting mealtime insulin.  Did discuss with 09/01/20, pharmacist, who recommends changing from Victoza 1.8 mg daily to Ozempic 0.5 mg weekly.  She can start this tomorrow.  We will obtain a BMP today.  In 1 month, plan to titrate up to the next dose if BMP is stable.  She will also reach out to patient if a freestyle CGM sample becomes available.  Continue with Cyndi Bender.  Left ear pain Now resolved.  No further follow-up needed.     Social research officer, government, DO [facility]

## 2020-08-30 ENCOUNTER — Encounter: Payer: Self-pay | Admitting: Family Medicine

## 2020-08-30 ENCOUNTER — Other Ambulatory Visit: Payer: Self-pay

## 2020-08-30 ENCOUNTER — Ambulatory Visit (INDEPENDENT_AMBULATORY_CARE_PROVIDER_SITE_OTHER): Payer: Medicare HMO | Admitting: Family Medicine

## 2020-08-30 VITALS — BP 130/72 | HR 91 | Ht 66.0 in | Wt 194.6 lb

## 2020-08-30 DIAGNOSIS — Z794 Long term (current) use of insulin: Secondary | ICD-10-CM

## 2020-08-30 DIAGNOSIS — H9202 Otalgia, left ear: Secondary | ICD-10-CM

## 2020-08-30 DIAGNOSIS — E1140 Type 2 diabetes mellitus with diabetic neuropathy, unspecified: Secondary | ICD-10-CM

## 2020-08-30 LAB — POCT GLYCOSYLATED HEMOGLOBIN (HGB A1C): HbA1c, POC (controlled diabetic range): 8.3 % — AB (ref 0.0–7.0)

## 2020-08-30 MED ORDER — OZEMPIC (0.25 OR 0.5 MG/DOSE) 2 MG/1.5ML ~~LOC~~ SOPN: 0.5000 mg | PEN_INJECTOR | SUBCUTANEOUS | 3 refills | Status: DC

## 2020-08-30 NOTE — Patient Instructions (Addendum)
Thank you for coming to see me today. It was a pleasure. Today we talked about:   Stop taking Victoza.  You will take Ozempic 0.5mg  once a week, start tomorrow.  We will get some labs today.  If they are abnormal or we need to do something about them, I will call you.  If they are normal, I will send you a message on MyChart (if it is active) or a letter in the mail.  If you don't hear from [place] in 2 weeks, please call the office at the number below.  Check your sugars in the morning and after your biggest meal of the day (supper).  Please follow-up with me in 4 weeks.  If you have any questions or concerns, please do not hesitate to call the office at [phone].  Best,    Luis Abed, DO

## 2020-08-30 NOTE — Assessment & Plan Note (Signed)
Now resolved.  No further follow-up needed.

## 2020-08-30 NOTE — Assessment & Plan Note (Signed)
A1c now increased from 7.8-8.3.  Patient continues have a goal of 7.5 or less for knee replacement.  We will continue to work at this.  She has titrated herself up to Basaglar 54 units in the morning and her CBGs in the morning are not terrible, suspect that her sugars are rising throughout the day especially with her largest meal of the day.  She is resistant at this time to starting mealtime insulin.  Did discuss with Cyndi Bender, pharmacist, who recommends changing from Victoza 1.8 mg daily to Ozempic 0.5 mg weekly.  She can start this tomorrow.  We will obtain a BMP today.  In 1 month, plan to titrate up to the next dose if BMP is stable.  She will also reach out to patient if a freestyle CGM sample becomes available.  Continue with Social research officer, government.

## 2020-08-31 ENCOUNTER — Other Ambulatory Visit: Payer: Self-pay | Admitting: Family Medicine

## 2020-08-31 DIAGNOSIS — E1140 Type 2 diabetes mellitus with diabetic neuropathy, unspecified: Secondary | ICD-10-CM

## 2020-08-31 DIAGNOSIS — Z794 Long term (current) use of insulin: Secondary | ICD-10-CM

## 2020-08-31 LAB — BASIC METABOLIC PANEL
BUN/Creatinine Ratio: 14 (ref 12–28)
BUN: 18 mg/dL (ref 8–27)
CO2: 24 mmol/L (ref 20–29)
Calcium: 8.7 mg/dL (ref 8.7–10.3)
Chloride: 101 mmol/L (ref 96–106)
Creatinine, Ser: 1.32 mg/dL — ABNORMAL HIGH (ref 0.57–1.00)
Glucose: 152 mg/dL — ABNORMAL HIGH (ref 65–99)
Potassium: 4.1 mmol/L (ref 3.5–5.2)
Sodium: 140 mmol/L (ref 134–144)
eGFR: 44 mL/min/{1.73_m2} — ABNORMAL LOW (ref >59)

## 2020-09-02 ENCOUNTER — Ambulatory Visit (INDEPENDENT_AMBULATORY_CARE_PROVIDER_SITE_OTHER): Payer: Medicare HMO

## 2020-09-02 ENCOUNTER — Other Ambulatory Visit: Payer: Self-pay

## 2020-09-02 ENCOUNTER — Ambulatory Visit (INDEPENDENT_AMBULATORY_CARE_PROVIDER_SITE_OTHER): Payer: Medicare HMO | Admitting: Pharmacist

## 2020-09-02 DIAGNOSIS — E1140 Type 2 diabetes mellitus with diabetic neuropathy, unspecified: Secondary | ICD-10-CM | POA: Diagnosis not present

## 2020-09-02 DIAGNOSIS — Z794 Long term (current) use of insulin: Secondary | ICD-10-CM

## 2020-09-02 DIAGNOSIS — Z95810 Presence of automatic (implantable) cardiac defibrillator: Secondary | ICD-10-CM | POA: Diagnosis not present

## 2020-09-02 DIAGNOSIS — I5022 Chronic systolic (congestive) heart failure: Secondary | ICD-10-CM | POA: Diagnosis not present

## 2020-09-02 NOTE — Patient Instructions (Signed)
Ms. Auten it was a pleasure seeing you today.   . The sensor is small waterproof disc that is placed on the back of the upper arm.  . There is a very thin filament that is inserted under the surface of the skin and measures the amount of glucose in the interstitial fluid.  . This system collects your sugar levels for up to 14 days and it automatically records the glucose level every 15 minutes. This will show your provider any patterns in your glucose levels.  Please remember... 1. Sensor will last 14 days 2. Sensor should be applied to area away from scarring, tattoos, irritation, and bones. 3. Starting the sensor: 1 hour warm up before BG readings available   4. Scan the sensor at least every 8 hours 5. Hold reader within 1.5 inches of sensor to scan 6. When the blood drop and magnifying glass symbol appears, test fingerstick blood glucose prior to making treatment decisions 7. Do a fingerstick blood glucose test if the sensor readings do not match how you feel 8. Remove sensor prior to magnetic resonance imaging (MRI), computed tomography (CT) scan, or high-frequency electrical heat (diathermy) treatment. 9. Freestyle Libre may be worn through a Environmental education officer. It may not be exposed to an advanced Imaging Technology (AIT) body scanner (also called a millimeter wave scanner) or the baggage x-ray machine. Instead, ask for hand-wanding or full-body pat-down and visual inspection.  10. Doses of vitamin C (ascorbic acid) >500 mg every day may cause false high readings. 11. Do not submerge more than 3 feet or keep underwater longer than 30 minutes at a time. Gently pat to dry.  12. Store sensor kit between 39 and 77 degrees Farenheit. Can be refrigerated within this temperature range.  Problems with Freestyle Libre sticking? 1. Order Tegaderm I.V. films to place directly over [facility] sensor on arm. 2. May also order Skin Tac from [facility]. Alcohol swab area you plan to  administer Freestyle Libre then let dry. Once dry, apply Skin Tac in a circular motion (with a spot in the middle for sensor without skin tac) and let dry. Once dry you can apply Freestyle Libre   Problems taking off Freestyle [place]? 1. Remember to try to shower before removing Freestyle Libre 2. Order Tac Away to help remove any extra adhesive left on your skin once you remove Freestyle Libre 3. May also try baby oil to loosen adhesive  [place] Phone number: [phone] Available 7 days a week; excluding holidays 8 AM to 8PM EST  [email or web address]  Hypoglycemia or low blood sugar:   Low blood sugar can happen quickly and may become an emergency if not treated right away.   While this shouldn't happen often, it can be brought upon if you skip a meal or do not eat enough. Also, if your insulin or other diabetes medications are dosed too high, this can cause your blood sugar to go to low.   Warning signs of low blood sugar include: 1. Feeling shaky or dizzy 2. Feeling weak or tired  3. Excessive hunger 4. Feeling anxious or upset  5. Sweating even when you aren't exercising  What to do if I experience low blood sugar? Follow the Rule of 15 1. Check your blood sugar. If lower than 70, proceed to step 2.  2. Treat with 15 grams of fast acting carbs which is found in 3-4 glucose tablets. If none are available you can try hard candy, 1 tablespoon  of sugar or honey,4 ounces of fruit juice, or 6 ounces of REGULAR soda.  3. Re-check your sugar in 15 minutes. If it is still below 70, do what you did in step 2 again. If your blood sugar has come back up, go ahead and eat a snack or small meal made up of complex carbs (ex. Whole grains) and protein at this time to avoid recurrence of low blood sugar.

## 2020-09-02 NOTE — Progress Notes (Signed)
Great.  Thanks

## 2020-09-02 NOTE — Progress Notes (Signed)
EPIC Encounter for ICM Monitoring  Patient Name: [name] is a 70 y.o. female Date: 09/02/2020 Primary Care Physican: Unknown [name], DO Primary Cardiologist:Croitoru Electrophysiologist:Croitoru 5/23/2022Weight: 190- 192lbs  Clinical Status (03-Jun-2020 to 22-Jul-2020)) AT/AF   4 Time in AT/AF 0.3 hr/day (1.4%) but was 0.1 hr/day (0.4%) on 4/11 remote transmission Longest AT/AF 2 hours   Spoke with patient and reports feeling well at this time. Denies fluid symptoms. She thinks she has been drinking more than 64 oz fluid daily which may be contributing to possible fluid accumulation.  Optivol thoracic impedancesuggested possible fluid accumulation since 08/29/2020 but trending back close to baseline normal  Prescribed dosage:  Torsemide 20 mg 1 tablet daily.   Potassium 20 mEq 1 tabletby mouth as directed.  Labs: 08/30/2020 Creatinine 1.32,  BUN 18, Potassium 4.1, Sodium 140, GFR 44 07/23/2020 Creatinine 1.11, BUN 17, Potassium 4.1, Sodium 139, GFR 54 07/11/2020 Creatinine 1.31, BUN 16, Potassium 4.8, Sodium 139, GFR 44  06/04/2020 Creatinine 1.28, BUN 21, Potassium 4.3, Sodium 141  A complete set of results can be found in Results Review.  Recommendations: Recommendation to limit salt intake to 2000 mg daily and fluid intake to 64 oz daily.  Encouraged to call if experiencing any fluid symptoms.   Follow-up plan: ICM clinic phone appointment on6/27/2022.91 day device clinic remote transmission8/01/2021.   Next office visit scheduled:7/1/2022for Dr Royann Shivers.  Copy of ICM check sent to Dr.Croitoru.  3 month ICM trend: 09/02/2020.    1 Year ICM trend:       [name], RN 09/02/2020 1:28 PM

## 2020-09-02 NOTE — Progress Notes (Signed)
Subjective:    Patient ID: [name], female    DOB: [birth year], 70 y.o.   MRN: [id number]  HPI Patient is a 70 y.o. female who presents for [facility] 2.0 application. She is in good spirits and presents without assistance. Patient was referred and last seen by Primary Care Provider on 08/30/20.  Patient needs to have an A1C of 7.5% or less to receive her knee replacement surgery. At last appt with PCP patient was hesitant to initiate mealtime insulin but was agreeable to wear Libre sensor for 14 days to obtain a better picture of her blood glucose trends.   Insurance coverage/medication affordability: Medicare  Current diabetes medications include: farxiga $RemoveBeforeDE'10mg'OOaDxvmWbDnQMUx$ , insulin glargine (Basaglar) 54 units once daily, ozempic 0.$RemoveBeforeDE'5mg'AFcOyvKkeDfzLpV$  once weekly  Freestyle Libre 2.0 patient education Person(s)instructed: patient  Patient taking >500 mg Vitamin C: denies Reminded patient to not take Vitamin C with [employer].  Instruction: [employer] 14-day Freestyle Libre patient education  CGM overview and set-up 1. Button, touch screen, and icons 2. Power supply and recharging 3. Home screen 4. Date and time 5. Set BG target range: 80-250 mg/dL 6. Set alarm/alert tone  7. Interstitial vs. capillary blood glucose readings  8. When to verify sensor reading with fingerstick blood glucose  Sensor application -- sensor placed on right arm 1. Site selection and site prep with alcohol pad/Skin Tac 2. Sensor prep-sensor pack and sensor applicator 3. Starting the sensor: 1 hour warm up before BG readings available     Will ask for fingersticks the first 12 hours   4. Sensor change every 14 days and rotate site 5. Call [employer] customer service if sensor comes off before 14 days  Safety and Troubleshooting 1. Scan the sensor at least every 8 hours 2. When the "test BG" symbol appears, test fingerstick blood glucose prior to    making treatment decisions 3. Do a fingerstick blood glucose test if the sensor  readings do not match how    you feel 4. Remove sensor prior for MRI or CT. Sensor may be damaged by exposure to    airport x-ray screening 5. Vitamin C may cause false high readings and aspirin may cause false low     readings 6. Store sensor kit between 39 and 77 degrees. Can be refrigerated at this temp.  Contact information provided for [employer] customer service and/or trainer.  Objective:   Labs:    Lab Results  Component Value Date   HGBA1C 8.3 (A) 08/30/2020   HGBA1C 7.8 (A) 06/04/2020   HGBA1C 8.5 (A) 03/06/2020   Lipid Panel     Component Value Date/Time   CHOL 113 01/31/2020 0916   TRIG 93 01/31/2020 0916   HDL 46 01/31/2020 0916   CHOLHDL 2.5 01/31/2020 0916   CHOLHDL 2.4 04/21/2016 1438   VLDL 15 04/21/2016 1438   LDLCALC 49 01/31/2020 0916   LDLDIRECT 94 12/07/2007 2019   Assessment/Plan:   T2DM is not controlled likely due to patient's blood glucose readings being elevated throughout the day. Freestyle Libre 2.0 CGM placed on patient's right arm successfully.   1. Continue current medications 2. Continue to use Libre 2.0 CGM 3. Counseled on s/sx of and management of hypoglycemia 4. Next A1C anticipated 11/30/20.   Follow-up appointment in two weeks to review blood glucose readings. Written patient instructions provided.  This appointment required 25 minutes of patient care (this includes precharting, chart review, review of results, and face-to-face care).  Thank you for involving pharmacy to  assist in providing this patient's care.

## 2020-09-12 NOTE — Progress Notes (Signed)
Remote ICD transmission.   

## 2020-09-13 DIAGNOSIS — G4733 Obstructive sleep apnea (adult) (pediatric): Secondary | ICD-10-CM | POA: Diagnosis not present

## 2020-09-13 DIAGNOSIS — J45909 Unspecified asthma, uncomplicated: Secondary | ICD-10-CM | POA: Diagnosis not present

## 2020-09-13 DIAGNOSIS — J449 Chronic obstructive pulmonary disease, unspecified: Secondary | ICD-10-CM | POA: Diagnosis not present

## 2020-09-13 DIAGNOSIS — I509 Heart failure, unspecified: Secondary | ICD-10-CM | POA: Diagnosis not present

## 2020-09-13 DIAGNOSIS — R5381 Other malaise: Secondary | ICD-10-CM | POA: Diagnosis not present

## 2020-09-16 ENCOUNTER — Other Ambulatory Visit: Payer: Self-pay

## 2020-09-16 ENCOUNTER — Ambulatory Visit (INDEPENDENT_AMBULATORY_CARE_PROVIDER_SITE_OTHER): Payer: Medicare HMO | Admitting: Pharmacist

## 2020-09-16 DIAGNOSIS — I10 Essential (primary) hypertension: Secondary | ICD-10-CM | POA: Diagnosis not present

## 2020-09-16 DIAGNOSIS — E1140 Type 2 diabetes mellitus with diabetic neuropathy, unspecified: Secondary | ICD-10-CM | POA: Diagnosis not present

## 2020-09-16 DIAGNOSIS — Z794 Long term (current) use of insulin: Secondary | ICD-10-CM

## 2020-09-16 NOTE — Patient Instructions (Signed)
[name] it was a pleasure seeing you today.   . The sensor is small waterproof disc that is placed on the back of the upper arm.  . There is a very thin filament that is inserted under the surface of the skin and measures the amount of glucose in the interstitial fluid.  . This system collects your sugar levels for up to 14 days and it automatically records the glucose level every 15 minutes. This will show your provider any patterns in your glucose levels.  Please remember... 1. Sensor will last 14 days 2. Sensor should be applied to area away from scarring, tattoos, irritation, and bones. 3. Starting the sensor: 1 hour warm up before BG readings available   4. Scan the sensor at least every 8 hours 5. Hold reader within 1.5 inches of sensor to scan 6. When the blood drop and magnifying glass symbol appears, test fingerstick blood glucose prior to making treatment decisions 7. Do a fingerstick blood glucose test if the sensor readings do not match how you feel 8. Remove sensor prior to magnetic resonance imaging (MRI), computed tomography (CT) scan, or high-frequency electrical heat (diathermy) treatment. 9. Freestyle Libre may be worn through a Environmental education officer. It may not be exposed to an advanced Imaging Technology (AIT) body scanner (also called a millimeter wave scanner) or the baggage x-ray machine. Instead, ask for hand-wanding or full-body pat-down and visual inspection.  10. Doses of vitamin C (ascorbic acid) >500 mg every day may cause false high readings. 11. Do not submerge more than 3 feet or keep underwater longer than 30 minutes at a time. Gently pat to dry.  12. Store sensor kit between 39 and 77 degrees Farenheit. Can be refrigerated within this temperature range.  Problems with Freestyle Libre sticking? 1. Order Tegaderm I.V. films to place directly over [name] sensor on arm. 2. May also order Skin Tac from [facility]. Alcohol swab area you plan to  administer Freestyle Libre then let dry. Once dry, apply Skin Tac in a circular motion (with a spot in the middle for sensor without skin tac) and let dry. Once dry you can apply Freestyle Libre   Problems taking off [name]? 1. Remember to try to shower before removing Freestyle Libre 2. Order Tac Away to help remove any extra adhesive left on your skin once you remove Freestyle Libre 3. May also try baby oil to loosen adhesive  [name] Phone number: [phone] Available 7 days a week; excluding holidays 8 AM to 8PM EST  [email or web address]  Please do the following:  1. Continue medications as directed today during your appointment. If you have any questions or if you believe something has occurred because of this change, call me or your doctor to let one of [place] know.  2. Continue checking blood sugars at home. It's really important that you record these and bring these in to your next doctor's appointment.  3. Continue making the lifestyle changes we've discussed together during our visit. Diet and exercise play a significant role in improving your blood sugars.  4. Follow-up with me op June 27th.   Hypoglycemia or low blood sugar:   Low blood sugar can happen quickly and may become an emergency if not treated right away.   While this shouldn't happen often, it can be brought upon if you skip a meal or do not eat enough. Also, if your insulin or other diabetes medications are dosed too high, this can  cause your blood sugar to go to low.   Warning signs of low blood sugar include: 1. Feeling shaky or dizzy 2. Feeling weak or tired  3. Excessive hunger 4. Feeling anxious or upset  5. Sweating even when you aren't exercising  What to do if I experience low blood sugar? Follow the Rule of 15 1. Check your blood sugar. If lower than 70, proceed to step 2.  2. Treat with 15 grams of fast acting carbs which is found in 3-4 glucose tablets. If none are  available you can try hard candy, 1 tablespoon of sugar or honey,4 ounces of fruit juice, or 6 ounces of REGULAR soda.  3. Re-check your sugar in 15 minutes. If it is still below 70, do what you did in step 2 again. If your blood sugar has come back up, go ahead and eat a snack or small meal made up of complex carbs (ex. Whole grains) and protein at this time to avoid recurrence of low blood sugar.

## 2020-09-16 NOTE — Assessment & Plan Note (Signed)
Hypertension longstanding currently slightly above goal.  Blood pressure goal <130/80 mmHg. Medication adherence appears optimal.    1. Patient has appt with PCP in 3 days and will have blood pressure re-checked  2. Extensively discussed pathophysiology of blood pressure, dietary effects on blood pressure control, and recommended lifestyle interventions.

## 2020-09-16 NOTE — Progress Notes (Signed)
Subjective:    Patient ID: [name], female    DOB: 1950-09-22, 70 y.o.   MRN: [id number]  HPI Patient is a 70 y.o. female who presents for diabetes management and Libre 2.0 review. She is in good spirits and presents without assistance. Patient was referred and last seen by Primary Care Provider on 08/30/20.  Patient needs to have an A1C of 7.5% or less to receive her knee replacement surgery. At last appt with PCP patient was hesitant to initiate mealtime insulin but was agreeable to wear Libre sensor for 14 days to obtain a better picture of her blood glucose trends. Patient reports that wearing the sensor was "different" and that she saw times where her blood glucose was high. She reports this is due to some of her dietary habits but cannot recall the exact foods she consumed that lead to elevated glucose readings.  Insurance coverage/medication affordability: Medicare  Family/Social history: patient currently unable to drive so her neighbor helps bring her to appointments  Current diabetes medications include: farxiga 47m, insulin glargine (Basaglar) 50 units once daily, ozempic 0.5105monce weekly Current hypertension medications include: bisoprolol 1055mtorsemide 64m53mrrent hyperlipidemia medications include: rosuvastatin 10mg29mient states that She is taking her medications as prescribed. Patient reports adherence with medications. Patient states that She does not miss doses of her medications.  Patient reported dietary habits: Patient reports that she does not eat on a consistent schedule and sometimes may eat less than three meals a day. She does report eating fruit regularly.   Freestyle Libre 2.0 patient education Patient taking >500 mg Vitamin C: denies Reminded patient to not take Vitamin C with Freestyle Libre.  Objective:   CGM report  Patient had two hypoglycemic events not reported above where her blood glucose was 67 and 68.   Labs:    Lab Results   Component Value Date   HGBA1C 8.3 (A) 08/30/2020   HGBA1C 7.8 (A) 06/04/2020   HGBA1C 8.5 (A) 03/06/2020    Vitals:   09/16/20 0945  BP: 138/86   Lipid Panel     Component Value Date/Time   CHOL 113 01/31/2020 0916   TRIG 93 01/31/2020 0916   HDL 46 01/31/2020 0916   CHOLHDL 2.5 01/31/2020 0916   CHOLHDL 2.4 04/21/2016 1438   VLDL 15 04/21/2016 1438   LDLCALC 49 01/31/2020 0916   LDLDIRECT 94 12/07/2007 2019    Clinical Atherosclerotic Cardiovascular Disease (ASCVD): No  The ASCVD Risk score (GoffMikey Bussingr., et al., 2013) failed to calculate for the following reasons:   The valid total cholesterol range is 130 to 320 mg/dL    Assessment/Plan:   T2DM is not controlled as patient has not met A1C goal to obtain knee surgery. Medication adherence appears optimal. Additional pharmacotherapy is not appropriate at this time. Patient will work on dietary changes to avoid mealtime insulin as she only demonstrated a few daytime spikes in glucose over the past two weeks. Patient recognizes importance of diet in managing her blood glucose to get her to her goal. Instructed patient to keep log of food that spikes her blood glucose so she can remember and avoid eating. Following instruction patient verbalized understanding of treatment plan.  1. Continued basal insulin glargine (Lantus) 50 units once daily. 2. Continued GLP-1 semaglutide (Ozempic) 0.5mg o27m weekly (Saturdays). Plan is to increase to 1mg at52mxt follow-up visit in 3 weeks. 3. Continue to use Libre 2.0 CGM 4. Extensively discussed pathophysiology of diabetes, dietary  effects on blood sugar control, and recommended lifestyle interventions 5. Patient will adhere to dietary modifications 6. Counseled on s/sx of and management of hypoglycemia 7. Submitted Libre 2.0 information to DME supplier for approval 8. Next A1C anticipated August 2022.  Follow-up appointment in three weeks to review sugar readings. Written patient  instructions provided.  ASCVD risk - primary prevention in patient with diabetes. Last LDL is controlled. ASCVD risk score is not >20%  - moderate intensity statin indicated.   1. Continued rosuvastatin 10 mg.   Hypertension longstanding currently slightly above goal.  Blood pressure goal <130/80 mmHg. Medication adherence appears optimal.    1. Patient has appt with PCP in 3 days and will have blood pressure re-checked  2. Extensively discussed pathophysiology of blood pressure, dietary effects on blood pressure control, and recommended lifestyle interventions.  This appointment required 60 minutes of patient care (this includes precharting, chart review, review of results, and face-to-face care).  Thank you for involving pharmacy to assist in providing this patient's care.  Patient seen with Caren Macadam PharmD Candidate, and Romilda Garret, PharmD - PGY-1 Resident.   The patient is currently using Continuous Glucose Monitoring. The patient is injecting insulin 1 time a day and was previously testing blood glucose 1-4 times a day. The patient is making adjustments to their diabetes regimen based on glucose readings.

## 2020-09-16 NOTE — Assessment & Plan Note (Signed)
T2DM is not controlled as patient has not met A1C goal to obtain knee surgery. Medication adherence appears optimal. Additional pharmacotherapy is not appropriate at this time. Patient will work on dietary changes to avoid mealtime insulin as she only demonstrated a few daytime spikes in glucose over the past two weeks. Patient recognizes importance of diet in managing her blood glucose to get her to her goal. Instructed patient to keep log of food that spikes her blood glucose so she can remember and avoid eating. Following instruction patient verbalized understanding of treatment plan.  1. Continued basal insulin glargine (Lantus) 50 units once daily. 2. Continued GLP-1 semaglutide (Ozempic) 0.38m once weekly (Saturdays). Plan is to increase to 160mat next follow-up visit in 3 weeks. 3. Continue to use Libre 2.0 CGM 4. Extensively discussed pathophysiology of diabetes, dietary effects on blood sugar control, and recommended lifestyle interventions 5. Patient will adhere to dietary modifications 6. Counseled on s/sx of and management of hypoglycemia 7. Submitted Libre 2.0 information to DME supplier for approval 8. Next A1C anticipated August 2022.

## 2020-09-17 NOTE — Progress Notes (Signed)
    SUBJECTIVE:   CHIEF COMPLAINT / HPI:   T2DM Current regimen: Lantus 54 units daily, Ozempic 0.5 mg weekly, Farxiga 10 mg daily CBGs 141 this AM She has been following with pharmacy team as well and currently has CGM, that showed most blood sugars are well controlled aside from certain things that she eats that spike her sugars She was counseled on monitoring this closely and avoiding foods that cause spikes in her sugars while we are trying to obtain an A1c goal at 7.5 Last BMP on 5/20 after switching from Victoza to Ozempic, WNL Due for an eye exam in next few months, gets them yearly Follows with podiatry  Hypertension BP goal less than 130/80 Current regimen: Bisoprolol 10 mg daily, torsemide 20 mg daily Follows with cardiology for PAF and history of ventricular tachycardia, on Tikosyn BP was 130/86 at visit with pharmacy on 6/6, but she said being in there with three people made her nervous  Due again for a colonoscopy  PERTINENT  PMH / PSH: HTN, PAF, ventricular tachycardia on Tikosyn, ICD in place, T2DM with neuropathy, OSA on CPAP, asthma, GERD, CKD a  OBJECTIVE:   BP 110/74 (BP Location: Right Arm, Patient Position: Sitting, Cuff Size: Normal)   Pulse 87   Ht 5\' 6"  (1.676 m)   Wt 193 lb 3.2 oz (87.6 kg)   LMP 07/26/2011   SpO2 99%   BMI 31.18 kg/m    Physical Exam:  General: 70 y.o. female in NAD Lungs: Breathing comfortably on room air Skin: warm and dry Extremities: No edema  Diabetic Foot Check -  Appearance - no lesions, ulcers or calluses Skin - no unusual pallor or redness Monofilament testing - normal bilaterally  Right - Great toe, medial, central, lateral ball and posterior foot intact Left - Great toe, medial, central, lateral ball and posterior foot intact    ASSESSMENT/PLAN:   Type 2 diabetes mellitus with diabetic neuropathy, with long-term current use of insulin (HCC) Doing well with continuous glucose monitoring and changed from Victoza  to Ozempic.  Has been on this for about 2 weeks, will obtain a BMP today.  Hopefully at her 1 month follow-up on 6/27 with pharmacy we can increase her Ozempic to 1 mg weekly.  Continue with current regimen.  She will continue to monitor her sugars with CGM and try to avoid foods that spike her sugars.  Her A1c goal is less than 7.5 given her need for bilateral knee replacement per Ortho recommendations.  Follow-up in 1 to 2 months with her new PCP.  Foot exam performed today.  Essential hypertension BP is well controlled today.  Continue current regimen.  Screen for colon cancer She would not like to repeat a colonoscopy at this time.  She was agreeable to Cologuard.  Advised that if this comes back as inconclusive or slightly concerning, she would need to proceed with colonoscopy, which she is okay with.  Order placed and message sent to lab.   Will get Shingrix at pharmacy  7/27, DO [place] [facility]

## 2020-09-19 ENCOUNTER — Encounter: Payer: Self-pay | Admitting: Family Medicine

## 2020-09-19 ENCOUNTER — Other Ambulatory Visit: Payer: Self-pay

## 2020-09-19 ENCOUNTER — Ambulatory Visit (INDEPENDENT_AMBULATORY_CARE_PROVIDER_SITE_OTHER): Payer: Medicare HMO | Admitting: Family Medicine

## 2020-09-19 VITALS — BP 110/74 | HR 87 | Ht 66.0 in | Wt 193.2 lb

## 2020-09-19 DIAGNOSIS — Z1211 Encounter for screening for malignant neoplasm of colon: Secondary | ICD-10-CM | POA: Diagnosis not present

## 2020-09-19 DIAGNOSIS — I10 Essential (primary) hypertension: Secondary | ICD-10-CM | POA: Diagnosis not present

## 2020-09-19 DIAGNOSIS — Z794 Long term (current) use of insulin: Secondary | ICD-10-CM

## 2020-09-19 DIAGNOSIS — E1140 Type 2 diabetes mellitus with diabetic neuropathy, unspecified: Secondary | ICD-10-CM

## 2020-09-19 NOTE — Assessment & Plan Note (Signed)
She would not like to repeat a colonoscopy at this time.  She was agreeable to Cologuard.  Advised that if this comes back as inconclusive or slightly concerning, she would need to proceed with colonoscopy, which she is okay with.  Order placed and message sent to lab.

## 2020-09-19 NOTE — Assessment & Plan Note (Signed)
Doing well with continuous glucose monitoring and changed from Victoza to Ozempic.  Has been on this for about 2 weeks, will obtain a BMP today.  Hopefully at her 1 month follow-up on 6/27 with pharmacy we can increase her Ozempic to 1 mg weekly.  Continue with current regimen.  She will continue to monitor her sugars with CGM and try to avoid foods that spike her sugars.  Her A1c goal is less than 7.5 given her need for bilateral knee replacement per Ortho recommendations.  Follow-up in 1 to 2 months with her new PCP.  Foot exam performed today.

## 2020-09-19 NOTE — Assessment & Plan Note (Signed)
BP is well controlled today.  Continue current regimen.  

## 2020-09-19 NOTE — Patient Instructions (Signed)
Thank you for coming to see me today. It was a pleasure. Today we talked about:   We will get some labs today.  If they are abnormal or we need to do something about them, I will call you.  If they are normal, I will send you a message on MyChart (if it is active) or a letter in the mail.  If you don't hear from [place] in 2 weeks, please call the office at the number below.   When you come back on 6/27 we will likely increase your ozempic.  Keep watching what you are eating and how it is affecting your sugars.  We will get a cologuard for you and if it is normal, you will need to repeat in 2 years.  Please follow-up with new PCP in 1-2 months.  If you have any questions or concerns, please do not hesitate to call the office at [phone].  Best,   Luis Abed, DO

## 2020-09-20 LAB — BASIC METABOLIC PANEL
BUN/Creatinine Ratio: 18 (ref 12–28)
BUN: 23 mg/dL (ref 8–27)
CO2: 25 mmol/L (ref 20–29)
Calcium: 8.6 mg/dL — ABNORMAL LOW (ref 8.7–10.3)
Chloride: 101 mmol/L (ref 96–106)
Creatinine, Ser: 1.31 mg/dL — ABNORMAL HIGH (ref 0.57–1.00)
Glucose: 136 mg/dL — ABNORMAL HIGH (ref 65–99)
Potassium: 4.4 mmol/L (ref 3.5–5.2)
Sodium: 142 mmol/L (ref 134–144)
eGFR: 44 mL/min/{1.73_m2} — ABNORMAL LOW (ref >59)

## 2020-09-25 ENCOUNTER — Other Ambulatory Visit: Payer: Self-pay | Admitting: Family Medicine

## 2020-09-25 ENCOUNTER — Telehealth: Payer: Self-pay | Admitting: Pharmacist

## 2020-09-25 DIAGNOSIS — Z1231 Encounter for screening mammogram for malignant neoplasm of breast: Secondary | ICD-10-CM

## 2020-09-25 MED ORDER — OZEMPIC (1 MG/DOSE) 4 MG/3ML ~~LOC~~ SOPN: 1.0000 mg | PEN_INJECTOR | SUBCUTANEOUS | 0 refills | Status: DC

## 2020-09-25 NOTE — Telephone Encounter (Signed)
Patient called to tell me that her Josephine Igo ends in 8 days.   She also reports that she finished her pen of Ozempic 0.5mg  and completed 4 doses. She takes her Ozempic on Saturdays. She asks if she should pick up another box or if we are going to increase her. Patient denies nausea and vomiting with Ozempic 0.5mg  dose. Plan is to increase Ozempic to 1mg . Sent in prescription to pharmacy for increased dose. Will also decrease patient's Basaglar dose from 54 units to 50 units once daily due to experiencing two episodes of hypoglycemia. She reports one of them occurring two days ago where her blood glucose was 68 at 11:58 PM. She reports having some orange juice and eating 1/2 bagel with peanut butter. Reports feeling better and her blood sugar increased.   Moved follow-up appt from 6/27 to 6/23 to remove [place] and download results.  Patient verbalized understanding of plan.

## 2020-09-25 NOTE — Telephone Encounter (Signed)
FMTS ATTENDING NOTE Nicolette Bang  I agree with the clinical pharmacist's findings, assessment and care plan.

## 2020-09-26 MED ORDER — OZEMPIC (1 MG/DOSE) 4 MG/3ML ~~LOC~~ SOPN: 1.0000 mg | PEN_INJECTOR | SUBCUTANEOUS | 0 refills | Status: DC

## 2020-09-26 NOTE — Addendum Note (Signed)
Addended by: Cheral Almas on: 09/26/2020 10:30 AM   Modules accepted: Orders

## 2020-10-03 ENCOUNTER — Other Ambulatory Visit: Payer: Self-pay

## 2020-10-03 ENCOUNTER — Ambulatory Visit (INDEPENDENT_AMBULATORY_CARE_PROVIDER_SITE_OTHER): Payer: Medicare HMO | Admitting: Pharmacist

## 2020-10-03 DIAGNOSIS — Z794 Long term (current) use of insulin: Secondary | ICD-10-CM | POA: Diagnosis not present

## 2020-10-03 DIAGNOSIS — E1140 Type 2 diabetes mellitus with diabetic neuropathy, unspecified: Secondary | ICD-10-CM | POA: Diagnosis not present

## 2020-10-03 NOTE — Patient Instructions (Addendum)
[name] it was a pleasure seeing you today.   Please do the following:  Continue medications as directed today during your appointment. If you have any questions or if you believe something has occurred because of this change, call me or your doctor to let one of [place] know.  Continue checking blood sugars at home. It's really important that you record these and bring these in to your next doctor's appointment.  Continue making the lifestyle changes we've discussed together during our visit. Diet and exercise play a significant role in improving your blood sugars.  Follow-up with me in 2 weeks.    Hypoglycemia or low blood sugar:   Low blood sugar can happen quickly and may become an emergency if not treated right away.   While this shouldn't happen often, it can be brought upon if you skip a meal or do not eat enough. Also, if your insulin or other diabetes medications are dosed too high, this can cause your blood sugar to go to low.   Warning signs of low blood sugar include: Feeling shaky or dizzy Feeling weak or tired  Excessive hunger Feeling anxious or upset  Sweating even when you aren't exercising  What to do if I experience low blood sugar? Follow the Rule of 15 Check your blood sugar with your meter. If lower than 70, proceed to step 2.  Treat with 15 grams of fast acting carbs which is found in 3-4 glucose tablets. If none are available you can try hard candy, 1 tablespoon of sugar or honey,4 ounces of fruit juice, or 6 ounces of REGULAR soda.  Re-check your sugar in 15 minutes. If it is still below 70, do what you did in step 2 again. If your blood sugar has come back up, go ahead and eat a snack or small meal made up of complex carbs (ex. Whole grains) and protein at this time to avoid recurrence of low blood sugar.

## 2020-10-03 NOTE — Progress Notes (Signed)
Subjective:    Patient ID: [name], female    DOB: 03-25-1951, 70 y.o.   MRN: [id number]  HPI Patient is a 70 y.o. female who presents for diabetes management and Libre 2.0 review. She is in good spirits and presents without assistance. Patient was last seen by Primary Care Provider on 09/19/20. Last seen in pharmacy clinic 09/16/2020.   Patient needs to have an A1C of 7.5% or less to receive her knee replacement surgery. Patient is hesitant to initiate mealtime insulin but is wearing a Libre sensor to get a better sense of her glucose trends. During last telephone call on 09/25/20 , instructed patient to increase dose of Ozempic on 6/18 to 1 mg once weekly. Patient reports making this change with no issues or side effects.   Insurance coverage/medication affordability: Medicare  Family/Social history: patient currently unable to drive so her neighbor helps bring her to appointments  Current diabetes medications include: farxiga 58m, insulin glargine (Basaglar) 50 units once daily, Ozempic (semaglutide) 1 mg once weekly  Patient reported dietary habits: Patient reports that she does not eat on a consistent schedule and sometimes may eat less than three meals a day. - Breakfast: Currently eating oatmeal with a small amount of sugar and strawberries and nuts  - Dinner: air fried chicken with brown rice and broccoli - Snacks: animal crackers, low sugar canned fruits   Freestyle Libre 2.0 patient education Patient taking >500 mg Vitamin C: denies Reminded patient to not take Vitamin C with [employer]  Objective:   CGM report  Patient had one hypoglycemic events not reported above where her blood glucose was and 68.   Labs:    Lab Results  Component Value Date   HGBA1C 8.3 (A) 08/30/2020   HGBA1C 7.8 (A) 06/04/2020   HGBA1C 8.5 (A) 03/06/2020    Lipid Panel     Component Value Date/Time   CHOL 113 01/31/2020 0916   TRIG 93 01/31/2020 0916   HDL 46 01/31/2020 0916    CHOLHDL 2.5 01/31/2020 0916   CHOLHDL 2.4 04/21/2016 1438   VLDL 15 04/21/2016 1438   LDLCALC 49 01/31/2020 0916   LDLDIRECT 94 12/07/2007 2019    Clinical Atherosclerotic Cardiovascular Disease (ASCVD): No  The ASCVD Risk score (Mikey BussingDC Jr., et al., 2013) failed to calculate for the following reasons:   The valid total cholesterol range is 130 to 320 mg/dL   PHQ-9: 0  Assessment/Plan:   T2DM is not controlled as patient has not met A1C goal to obtain knee surgery. CGM report demonstrates glucose was slightly up from last time. Medication adherence appears optimal. Will hold off on adding mealtime insulin as patient will continue to work on dietary changes to avoid mealtime insulin as she continues to only demonstrate a few daytime spikes in glucose over the past two weeks. Patient recognizes importance of diet in managing her blood glucose to get her to her goal and is motivated to make dietary changes. Patient understands that if she continues to have spikes next step will likely be to add small amount of mealtime insulin to ensure her A1C is at goal. Following instruction patient verbalized understanding of treatment plan.  Continued basal insulin glargine (Lantus) 50 units once daily. Continued GLP-1 semaglutide (Ozempic) 188monce weekly (Saturdays). Consider dose increase to 93m33mnce weekly 10/26/2020. Continue to use Libre 2.0 CGM Extensively discussed pathophysiology of diabetes, dietary effects on blood sugar control, and recommended lifestyle interventions Patient will adhere to dietary  modifications Counseled on s/sx of and management of hypoglycemia Submitted Libre 2.0 information to DME supplier for approval Next A1C anticipated August 2022.  Follow-up appointment in two weeks to review sugar readings if patient does not receive [place] supplies from DME supplier. Written patient instructions provided.  This appointment required 30 minutes of patient care (this includes  precharting, chart review, review of results, and face-to-face care).  Thank you for involving pharmacy to assist in providing this patient's care.  Patient seen with Romilda Garret, PharmD - PGY-1 Resident.   The patient is currently using Continuous Glucose Monitoring. The patient is injecting insulin 1 time a day and was previously testing blood glucose 1-4 times a day. The patient is making adjustments to their diabetes regimen based on glucose readings.

## 2020-10-03 NOTE — Assessment & Plan Note (Signed)
T2DM is not controlled as patient has not met A1C goal to obtain knee surgery. CGM report demonstrates glucose was slightly up from last time. Medication adherence appears optimal. Will hold off on adding mealtime insulin as patient will continue to work on dietary changes to avoid mealtime insulin as she continues to only demonstrate a few daytime spikes in glucose over the past two weeks. Patient recognizes importance of diet in managing her blood glucose to get her to her goal and is motivated to make dietary changes. Patient understands that if she continues to have spikes next step will likely be to add small amount of mealtime insulin to ensure her A1C is at goal. Following instruction patient verbalized understanding of treatment plan.  1. Continued basal insulin glargine (Lantus) 50 units once daily. 2. Continued GLP-1 semaglutide (Ozempic) 1mg  once weekly (Saturdays). Consider dose increase to 2mg  once weekly 10/26/2020. 3. Continue to use Libre 2.0 CGM 4. Extensively discussed pathophysiology of diabetes, dietary effects on blood sugar control, and recommended lifestyle interventions 5. Patient will adhere to dietary modifications 6. Counseled on s/sx of and management of hypoglycemia 7. Submitted Libre 2.0 information to DME supplier for approval 8. Next A1C anticipated August 2022.

## 2020-10-07 ENCOUNTER — Encounter: Payer: Medicare HMO | Admitting: Cardiovascular Disease

## 2020-10-07 ENCOUNTER — Ambulatory Visit (INDEPENDENT_AMBULATORY_CARE_PROVIDER_SITE_OTHER): Payer: Medicare HMO

## 2020-10-07 ENCOUNTER — Ambulatory Visit: Payer: Medicare HMO | Admitting: Pharmacist

## 2020-10-07 ENCOUNTER — Telehealth: Payer: Self-pay | Admitting: Pharmacist

## 2020-10-07 DIAGNOSIS — I5022 Chronic systolic (congestive) heart failure: Secondary | ICD-10-CM

## 2020-10-07 DIAGNOSIS — E1140 Type 2 diabetes mellitus with diabetic neuropathy, unspecified: Secondary | ICD-10-CM

## 2020-10-07 DIAGNOSIS — Z95810 Presence of automatic (implantable) cardiac defibrillator: Secondary | ICD-10-CM | POA: Diagnosis not present

## 2020-10-07 MED ORDER — BASAGLAR KWIKPEN 100 UNIT/ML ~~LOC~~ SOPN: 50.0000 [IU] | PEN_INJECTOR | Freq: Every day | SUBCUTANEOUS | 2 refills | Status: DC

## 2020-10-07 NOTE — Telephone Encounter (Signed)
Patient called to let me know that they are working on processing her information for [employer] through the DME. She is not sure the cost yet and will follow-up with me once she obtains this information. Patient also requests refill on insulin glargine (Basaglar) 50 units once daily. Refill sent to pharmacy.

## 2020-10-08 ENCOUNTER — Telehealth: Payer: Self-pay

## 2020-10-08 NOTE — Telephone Encounter (Signed)
Received phone call on nurse line regarding denial of CGM. Representative provided number for appeal at [phone]. Reference number [id number].   Forwarding to PCP  Veronda Prude, RN

## 2020-10-09 NOTE — Progress Notes (Signed)
EPIC Encounter for ICM Monitoring  Patient Name: [name] is a 70 y.o. female Date: 10/09/2020 Primary Care Physican: Unknown [name], DO Primary Cardiologist: Croitoru Electrophysiologist: Croitoru 09/02/2020 Weight: 190 - 192 lbs   Time in AT/AF  0.0 hr/day (0.0%)                                                  Spoke with patient and reports feeling well at this time.  Denies fluid symptoms.    Optivol thoracic impedance suggested normal fluid levels.   Prescribed dosage:  Torsemide 20 mg 1 tablet daily.   Potassium 20 mEq 1 tablet by mouth as directed.   Labs: 09/19/2020 Creatinine 1.31, BUN 23, Potassium 4.4, Sodium 142, GFR 44 08/30/2020 Creatinine 1.32, BUN 18, Potassium 4.1, Sodium 140, GFR 44 07/23/2020 Creatinine 1.11, BUN 17, Potassium 4.1, Sodium 139, GFR 54 07/11/2020 Creatinine 1.31, BUN 16, Potassium 4.8, Sodium 139, GFR 44 06/04/2020 Creatinine 1.28, BUN 21, Potassium 4.3, Sodium 141  A complete set of results can be found in Results Review.   Recommendations:   Encouraged to call if experiencing any fluid symptoms.   Follow-up plan: ICM clinic phone appointment on 11/11/2020.   91 day device clinic remote transmission 11/20/2020.     Next office visit scheduled:  10/11/2020 for Dr Royann Shivers.     Copy of ICM check sent to Dr. Royann Shivers.   3 month ICM trend: 10/07/2020.    1 Year ICM trend:       [name], RN 10/09/2020 1:11 PM

## 2020-10-10 ENCOUNTER — Telehealth: Payer: Self-pay | Admitting: *Deleted

## 2020-10-10 ENCOUNTER — Encounter: Payer: Self-pay | Admitting: *Deleted

## 2020-10-10 NOTE — Telephone Encounter (Signed)
Left a message for the patient to call back to change her appointment to a MyChart visit for tomorrow or to reschedule.

## 2020-10-11 ENCOUNTER — Encounter: Payer: Medicare HMO | Admitting: Cardiovascular Disease

## 2020-10-11 ENCOUNTER — Telehealth: Payer: Self-pay | Admitting: Cardiovascular Disease

## 2020-10-11 DIAGNOSIS — E1140 Type 2 diabetes mellitus with diabetic neuropathy, unspecified: Secondary | ICD-10-CM | POA: Diagnosis not present

## 2020-10-11 MED ORDER — BISOPROLOL FUMARATE 10 MG PO TABS: 10.0000 mg | ORAL_TABLET | Freq: Two times a day (BID) | ORAL | 1 refills | Status: DC

## 2020-10-11 NOTE — Telephone Encounter (Signed)
*  STAT* If patient is at the pharmacy, call can be transferred to refill team.   1. Which medications need to be refilled? (please list name of each medication and dose if known) bisoprolol (ZEBETA) 10 MG tablet  2. Which pharmacy/location (including street and city if local pharmacy) is medication to be sent to? [id number] - [place], [place] - [address] AT [facility] [address]  3. Do they need a 30 day or 90 day supply? 90   Patient only has a couple left, states the pharmacy has been trying to get in touch with our office about refills. Needs refill before the weekend.

## 2020-10-11 NOTE — Telephone Encounter (Signed)
Appointment has been rescheduled.

## 2020-10-13 DIAGNOSIS — I509 Heart failure, unspecified: Secondary | ICD-10-CM | POA: Diagnosis not present

## 2020-10-13 DIAGNOSIS — J45909 Unspecified asthma, uncomplicated: Secondary | ICD-10-CM | POA: Diagnosis not present

## 2020-10-13 DIAGNOSIS — G4733 Obstructive sleep apnea (adult) (pediatric): Secondary | ICD-10-CM | POA: Diagnosis not present

## 2020-10-13 DIAGNOSIS — R5381 Other malaise: Secondary | ICD-10-CM | POA: Diagnosis not present

## 2020-10-13 DIAGNOSIS — J449 Chronic obstructive pulmonary disease, unspecified: Secondary | ICD-10-CM | POA: Diagnosis not present

## 2020-10-17 ENCOUNTER — Ambulatory Visit: Payer: Medicare HMO | Admitting: Pharmacist

## 2020-10-22 ENCOUNTER — Other Ambulatory Visit: Payer: Self-pay

## 2020-10-22 ENCOUNTER — Encounter: Payer: Self-pay | Admitting: Cardiovascular Disease

## 2020-10-22 ENCOUNTER — Ambulatory Visit (INDEPENDENT_AMBULATORY_CARE_PROVIDER_SITE_OTHER): Payer: Medicare HMO | Admitting: Cardiovascular Disease

## 2020-10-22 VITALS — BP 118/70 | HR 75 | Ht 66.0 in | Wt 194.0 lb

## 2020-10-22 DIAGNOSIS — E669 Obesity, unspecified: Secondary | ICD-10-CM

## 2020-10-22 DIAGNOSIS — N179 Acute kidney failure, unspecified: Secondary | ICD-10-CM

## 2020-10-22 DIAGNOSIS — Z7901 Long term (current) use of anticoagulants: Secondary | ICD-10-CM

## 2020-10-22 DIAGNOSIS — Z5181 Encounter for therapeutic drug level monitoring: Secondary | ICD-10-CM

## 2020-10-22 DIAGNOSIS — Z95810 Presence of automatic (implantable) cardiac defibrillator: Secondary | ICD-10-CM | POA: Diagnosis not present

## 2020-10-22 DIAGNOSIS — I472 Ventricular tachycardia, unspecified: Secondary | ICD-10-CM

## 2020-10-22 DIAGNOSIS — I480 Paroxysmal atrial fibrillation: Secondary | ICD-10-CM

## 2020-10-22 DIAGNOSIS — E7800 Pure hypercholesterolemia, unspecified: Secondary | ICD-10-CM

## 2020-10-22 DIAGNOSIS — I5032 Chronic diastolic (congestive) heart failure: Secondary | ICD-10-CM | POA: Diagnosis not present

## 2020-10-22 DIAGNOSIS — I10 Essential (primary) hypertension: Secondary | ICD-10-CM

## 2020-10-22 DIAGNOSIS — G4733 Obstructive sleep apnea (adult) (pediatric): Secondary | ICD-10-CM

## 2020-10-22 DIAGNOSIS — Z79899 Other long term (current) drug therapy: Secondary | ICD-10-CM

## 2020-10-22 DIAGNOSIS — N1831 Chronic kidney disease, stage 3a: Secondary | ICD-10-CM

## 2020-10-22 DIAGNOSIS — J4540 Moderate persistent asthma, uncomplicated: Secondary | ICD-10-CM

## 2020-10-22 DIAGNOSIS — E1169 Type 2 diabetes mellitus with other specified complication: Secondary | ICD-10-CM

## 2020-10-22 NOTE — Progress Notes (Signed)
Patient ID: [name], female   DOB: 03/12/1951, 70 y.o.   MRN: [id number] Patient ID: [name], female   DOB: [birth year], 70 y.o.   MRN: [id number]    Cardiology Office Note    Date:  10/22/2020   ID:  [name], DOB July 24, 1950, MRN [id number]  PCP:  Maury Dus, MD  Cardiologist:   Thurmon Fair, MD   Chief Complaint  Patient presents with   Follow-up    6 months.    History of Present Illness:  [name] is a 70 y.o. female with a long-standing history of nonischemic cardiomyopathy, chronic systolic heart failure and paroxysmal atrial fibrillation.    Feels well. The patient specifically denies any chest pain at rest exertion, dyspnea at rest or with exertion, orthopnea, paroxysmal nocturnal dyspnea, syncope, palpitations, focal neurological deficits, intermittent claudication, lower extremity edema, unexplained weight gain, cough, hemoptysis or wheezing. Denies bleeding or falls. Activity limited by right knee pain. Compliant w CPAP and denies daytime hypersomnolence.  On July 19, 2020 she had very fast VT (CL 280 ms), mostly monomorphic. ATP corrected it for one beat, but then it recurred. A second burst of ATP stopped the arrhythmia. The episode was mildly symptomatic. No recurrence since. Labs were normal.  Underlying rhythm is sinus bradycardia in low 50s with PVCs and PACs. Has 96% A-paced, V-sensed rhythm due to atrial preference pacing, <1% V pacing. AFib burden is low and stable around 1.3% (on dofetilide, Eliquis). Many of the episodes appear to be fairly regular, probably atypical flutter, well rate controlled. Leads and generator have normal parameters (estimated generator longevity 5.6 years).   She is unaware of the atrial arrhythmia. Occasional mildly elevated V rates.   She had antitachycardia pacing for ventricular tachycardia in December 2016 and again in April 2022. Since then a handful of episodes of nonsustained VT have been recorded, none meeting  criteria for treatment. She is compliant with CPAP for obstructive sleep apnea.  She has a history of moderate nonischemic cardiomyopathy (no coronary disease by cardiac catheterization January 2012), recurrent paroxysmal atrial fibrillation status post 2 ablation procedures (Dr. Orson Aloe), with history of ventricular fibrillation arrest while on treatment with diltiazem and dofetilide and a prolonged QT interval, probably related to simultaneous quinolone therapy. She has a dual-chamber Medtronic Protecta defibrillator implanted in January 2012. In December 2016 her defibrillator recorded an episode of sustained monomorphic ventricular tachycardia. Antitachycardia pacing was unsuccessful at converting the arrhythmia, but she had a "dirty break" before defibrillator shock was delivered. The shock was aborted. Polymorphic VT lasting 2 seconds was recorded in the fall of 2017 during asthma exacerbation, but did not require intervention. Had sustained monomorphic VT on July 19, 2020. ATP successfully terminated the arrhythmia.  Most recent echo June 2020 showed normal LVEF 55-60%, but reduced GLS -14%. At LV angiography in 2012, EF was 15-20%.  Past Medical History:  Diagnosis Date   Anemia    Anxiety    Arthritis    Arthritis of carpometacarpal (CMC) joint of thumb 03/26/2016   Asthma    has had multiple hospitalizations for this   Asthma, moderate persistent 12/10/2011   Atrial fibrillation (HCC)    ablation x 2 [facility], 01/2006, 2011.  on warfarin   Cardiac arrest [facility]) jan 2012   in hospital for pneumonia when this occured- occured at the hospital   CHF (congestive heart failure) (HCC) 2012   Echo 08/08/10 by [facility]. EF 35-45%. LV systolic function moderately  reduced. Moderate global hypokinesis of LV.  RV systolic function moderately reduced. Mild MR. Trace AR.   Chronic diastolic heart failure (HCC) 01/07/2018   CKD (chronic kidney disease) stage 2, GFR 60-89 ml/min 02/22/2012   Her  cr range 1.2-1.5 since Jan 2012 after hospitalization. 10/13 Estimated Creatinine Clearance: 69.3 ml/min (by C-G formula based on Cr of 1).     Complication of anesthesia    difficult time waking up after anesthesia   Depression    Diabetes mellitus    Type 2   Diabetic neuropathy (HCC) 09/18/2011   GASTROESOPHAGEAL REFLUX, NO ESOPHAGITIS 06/10/2006   Qualifier: Diagnosis of  By: Abundio MiuMcGregor, Barbara     GERD (gastroesophageal reflux disease) 03/23/2018   History of hiatal hernia    HYPERCHOLESTEROLEMIA 06/10/2006   LDL 93 at 01/2011 check-  Continue pravastatin 20mg  po daily.  --discuss health modifications and possibly increaseing dose at next appt.  Cardiology- Dr little- stopped pravachol on 05/21/11- 2/2 allergies?     Hyperlipidemia    Hypertension    ICD (St. Jude Protecta dual-chamber),secondary prevention (VF arrest) January 2012 11/19/2012   Iliotibial band syndrome, left 11/01/2018   Limb pain 06/04/2008   LLE, Baker's cyst in popliteal fossa, no DVT   Long term current use of anticoagulant therapy 08/21/2015   Non-ischemic cardiomyopathy (HCC)    echo 08/08/10 - EF 35-45% LV and RV systolic function mod reduced   Obesity (BMI 30-39.9) 01/04/2014   OSA on CPAP 06/10/2006   Sleep study 02/2014 : severe apnea, corrected with nasal pillows and 8cmh2o     Osteoarthritis of right knee 08/28/2008   Qualifier: Diagnosis of  By: Pearletha ForgeHudnall MD, Vincenza HewsShane     Primary localized osteoarthritis of right knee    Sleep apnea    wears CPAP nightly   Type 2 diabetes mellitus with diabetic neuropathy, with long-term current use of insulin (HCC) 06/10/2006              Ventricular tachycardia (HCC) 05/22/2015   Visit for monitoring Tikosyn therapy 08/21/2015   Vocal cord disease     Past Surgical History:  Procedure Laterality Date   BREAST EXCISIONAL BIOPSY Right 1999   BREAST LUMPECTOMY Right    CARDIAC DEFIBRILLATOR PLACEMENT  05/02/10   Medtronic Protecta XT-DR for CHF-VT, last download 04/12/12    CHOLECYSTECTOMY     COLONOSCOPY     HAMMER TOE SURGERY Right    ICD GENERATOR CHANGEOUT N/A 09/28/2017   Procedure: ICD GENERATOR CHANGEOUT;  Surgeon: Thurmon Fairroitoru, Starlene Consuegra, MD;  Location: [facility];  Service: Cardiovascular;  Laterality: N/A;   KNEE ARTHROSCOPY Right    PAF Ablation     By Dr Sampson GoonFitzgerald. [facility]   TOTAL KNEE ARTHROPLASTY Left 09/17/2014   Procedure: TOTAL KNEE ARTHROPLASTY;  Surgeon: Salvatore Marvelobert Wainer, MD;  [facility];  Service: Orthopedics;  Laterality: Left;  pt has ICD   TUBAL LIGATION      Outpatient Medications Prior to Visit  Medication Sig Dispense Refill   acetaminophen (TYLENOL 8 HOUR) 650 MG CR tablet Take 1 tablet (650 mg total) by mouth every 8 (eight) hours as needed for pain. 90 tablet 2   bisoprolol (ZEBETA) 10 MG tablet Take 1 tablet (10 mg total) by mouth in the morning and at bedtime. 180 tablet 1   budesonide-formoterol (SYMBICORT) 80-4.5 MCG/ACT inhaler INHALE 2 PUFFS INTO THE LUNGS IN THE MORNING AND AT BEDTIME 10.2 g 11   buPROPion (WELLBUTRIN XL) 150 MG  24 hr tablet Take 1 tablet (150 mg total) by mouth daily. 90 tablet 3   diclofenac Sodium (VOLTAREN) 1 % GEL Apply 2 g topically 4 (four) times daily. 100 g 1   dofetilide (TIKOSYN) 250 MCG capsule TAKE 1 CAPSULE(250 MCG) BY MOUTH TWICE DAILY 180 capsule 3   ELIQUIS 5 MG TABS tablet TAKE 1 TABLET(5 MG) BY MOUTH TWICE DAILY 180 tablet 1   famotidine (PEPCID) 20 MG tablet Take 1 tablet (20 mg total) by mouth daily. 90 tablet 3   FARXIGA 10 MG TABS tablet TAKE 1 TABLET(10 MG) BY MOUTH DAILY BEFORE AND BREAKFAST 90 tablet 1   fluticasone (FLONASE) 50 MCG/ACT nasal spray instill 1 spray into each nostril twice a day 16 g 5   glucose blood test strip Use as instructed 100 each 12   Insulin Glargine (BASAGLAR KWIKPEN) 100 UNIT/ML Inject 50 Units into the skin daily. 15 mL 2   Insulin Pen Needle (B-D ULTRAFINE III SHORT PEN) 31G X 8 MM MISC Use as directed up to 4 times daily to check  blood sugar 100 each 9   levalbuterol (XOPENEX HFA) 45 MCG/ACT inhaler INHALE 1 TO 2 PUFFS BY MOUTH INTO THE LUNGS EVERY 4 HOURS IF NEEDED FOR WHEEZING 1 Inhaler 1   levalbuterol (XOPENEX) 0.63 MG/3ML nebulizer solution One vial in nebulizer four times daily as needed 360 mL 5   montelukast (SINGULAIR) 10 MG tablet TAKE 1 TABLET BY MOUTH AT BEDTIME 90 tablet 1   polyethylene glycol powder (GLYCOLAX/MIRALAX) 17 GM/SCOOP powder take 17GM (DISSOLVED IN WATER) by mouth once daily THREE TIMES A WEEK if needed as directed 500 g 3   potassium chloride SA (KLOR-CON) 20 MEQ tablet Take 1 tablet (20 mEq total) by mouth daily as directed. (Patient taking differently: Take 1 tablet (20 mEq total) by mouth daily) 30 tablet 11   PRODIGY LANCETS 28G MISC 1 Units by Does not apply route 4 (four) times daily. 100 each 4   rosuvastatin (CRESTOR) 10 MG tablet TAKE 1 TABLET BY MOUTH DAILY 90 tablet 3   Semaglutide, 1 MG/DOSE, (OZEMPIC, 1 MG/DOSE,) 4 MG/3ML SOPN Inject 1 mg into the skin once a week. 3 mL 0   tiZANidine (ZANAFLEX) 2 MG tablet TAKE 1 TABLET BY MOUTH AT BEDTIME FOR MUSCLE SPASM     torsemide (DEMADEX) 20 MG tablet Take 1 tablet (20 mg total) by mouth daily. 180 tablet 1   No facility-administered medications prior to visit.     Allergies:   Avelox [moxifloxacin hcl in nacl], Nsaids, Simvastatin, Ace inhibitors, Jardiance [empagliflozin], and Latex   Social History   Socioeconomic History   Marital status: Widowed    Spouse name: Not on file   Number of children: Not on file   Years of education: Not on file   Highest education level: Not on file  Occupational History   Not on file  Tobacco Use   Smoking status: Never   Smokeless tobacco: Never  Vaping Use   Vaping Use: Never used  Substance and Sexual Activity   Alcohol use: No   Drug use: No   Sexual activity: Never  Other Topics Concern   Not on file  Social History Narrative   Not employed   Exercise- walking 1 hour daily.     Diet- eating healthy diet.       [facility]:   She is originally from [facility]. Has always lived in [place]. 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 Currently has a small dog. No bird or mold exposure.       Current Social History 04/08/2017        Patient lives with daughter, Lafonda Mosses, in two level home 04/08/2017   Transportation: Patient has own vehicle and drives herself 04/08/2017   Important Relationships Daughter, Lafonda Mosses 04/08/2017    Pets: Shiatzu Evaristo Bury)  04/08/2017   Education / Work:  10 th grade/ None 04/08/2017   Interests / Fun: Read, do puzzles 04/08/2017   Current Stressors: None because she prays to God 04/08/2017   Religious / Personal Beliefs: Non-Denominational 04/08/2017   L. Ducatte, RN, BSN                                                                                                 Social Determinants of Health   Financial Resource Strain: Not on file  Food Insecurity: Not on file  Transportation Needs: Not on file  Physical Activity: Not on file  Stress: Not on file  Social Connections: Not on file     Family History:  The patient's family history includes Diabetes in her brother and father; Hypertension in her brother and father.   ROS:   Please see the history of present illness.    Otherwise negative ROS  PHYSICAL EXAM:   VS:  BP 118/70 (BP Location: Left Arm, Patient Position: Sitting, Cuff Size: Large)   Pulse 75   Ht 5\' 6"  (1.676 m)   Wt 194 lb (88 kg)   LMP 07/26/2011   BMI 31.31 kg/m     General: Alert, oriented x3, no distress, Obese, healthy ICD site Head: no evidence of trauma, PERRL, EOMI, no exophtalmos or lid lag, no myxedema, no xanthelasma; normal ears, nose and oropharynx Neck: normal jugular venous pulsations and no hepatojugular reflux; brisk carotid pulses without delay and no carotid bruits Chest: clear to auscultation, no  signs of consolidation by percussion or palpation, normal fremitus, symmetrical and full respiratory excursions Cardiovascular: normal position and quality of the apical impulse, regular rhythm, normal first and second heart sounds, no murmurs, rubs or gallops Abdomen: no tenderness or distention, no masses by palpation, no abnormal pulsatility or arterial bruits, normal bowel sounds, no hepatosplenomegaly Extremities: no clubbing, cyanosis or edema; 2+ radial, ulnar and brachial pulses bilaterally; 2+ right femoral, posterior tibial and dorsalis pedis pulses; 2+ left femoral, posterior tibial and dorsalis pedis pulses; no subclavian or femoral bruits Neurological: grossly nonfocal Psych: Normal mood and affect    Wt Readings from Last 3 Encounters:  10/22/20 194 lb (88 kg)  09/19/20 193 lb 3.2 oz (87.6 kg)  08/30/20 194 lb 9.6 oz (88.3 kg)      Studies/Labs Reviewed:   ECHO 09/19/2018  1. The left ventricle has normal systolic function, with an ejection fraction of 55-60%. The cavity size was normal. There is mild asymmetric left ventricular hypertrophy. Left ventricular diastolic Doppler parameters are consistent with impaired  relaxation. No evidence of left ventricular regional wall motion abnormalities.  2. The average  left ventricular global longitudinal strain is 14.9 %.  3. The right ventricle has normal systolic function. The cavity was normal. There is no increase in right ventricular wall thickness.  4. Left atrial size was mildly dilated.  5. The aortic valve is tricuspid. Mild sclerosis of the aortic valve. Aortic valve regurgitation is trivial by color flow Doppler.    EKG:  EKG is ordered today.  A paced Vsensed rhythm, otherwise normal QTc 448 ms  Recent Labs: 07/23/2020: BNP 92.1; Magnesium 2.2 09/19/2020: BUN 23; Creatinine, Ser 1.31; Potassium 4.4; Sodium 142   Lipid Panel    Component Value Date/Time   CHOL 113 01/31/2020 0916   TRIG 93 01/31/2020 0916   HDL 46  01/31/2020 0916   CHOLHDL 2.5 01/31/2020 0916   CHOLHDL 2.4 04/21/2016 1438   VLDL 15 04/21/2016 1438   LDLCALC 49 01/31/2020 0916   LDLDIRECT 94 12/07/2007 2019     ASSESSMENT:    1. VT (ventricular tachycardia) (HCC)   2. Paroxysmal atrial fibrillation (HCC)   3. Chronic diastolic heart failure (HCC)   4. ICD (implantable cardioverter-defibrillator) in place   5. Long term (current) use of anticoagulants   6. Essential hypertension   7. Hypercholesterolemia   8. Mild obesity   9. OSA (obstructive sleep apnea)   10. Encounter for monitoring dofetilide therapy   11. Moderate persistent asthma without complication   12. Diabetes mellitus type 2 in obese (HCC)   13. Acute renal failure superimposed on stage 3a chronic kidney disease, unspecified acute renal failure type (HCC)     VT: Required ICD intervention twice in 2016 and 2022, both times monomorphic VT resolved with with ATP, despite marked improvement in left ventricular systolic function she has had ventricular tachycardia that required device intervention.  She is also at risk of developing torsades while in treatment with dofetilide, but QT is normal. Ongoing ICD therapy is appropriate despite recovered EF is appropriate. PAF: Asymptomatic. Overall burden of atrial fibrillation remains low at 1.3%, on dofetilide. Fairly well rate controlled on bisoprolol.  On appropriate anticoagulation.  CHA2DS2-VASc 4 (CHF, gender, age, hypertension, diabetes mellitus. CHF: NYHA class I, euvolemic clinically and per Optivol.  OptiVol without deviation since  October, on low dose torsemide.  Dry weight is probably 195 pounds or less. ICD: Normal device function.  Continue remote downloads every 3 months. Anticoagulation: no bleeding issues. HTN: controlled. History of cough w ACEi. HLP: on statin, lipids in target range.  LDL 49. Obesity: She has been successful in efforts to lose weight.  Remains mildly obese. OSA: Uses CPAP 100%.  No  daytime sleepiness. Tikosyn: QTc 448 ms today.  Recheck ECG and labs every 6 months (last checked in June). Asthma: Tolerates bisoprolol well.  Other beta-blockers worsened wheezing in the past. DM: Control still not at target with last A1c 8.3%, but she is confident this is improving. On SGLT2 inh. CKD: Stable renal function, GFR 50-60.  Most recent creatinine 1.3.   For some reason her insurance charges more for 10 mg bisoprolol tabs (nonformulary) than it does for 25 mg.  PLAN:  In order of problems listed above:  Fascinating. Have not seen one there before. Medication Adjustments/Labs and Tests Ordered: Current medicines are reviewed at length with the patient today.  Concerns regarding medicines are outlined above.  Medication changes, Labs and Tests ordered today are listed in the Patient Instructions below. Patient Instructions  Medication Instructions:  No changes *If you need a refill on your cardiac  medications before your next appointment, please call your pharmacy*   Lab Work: None ordered If you have labs (blood work) drawn today and your tests are completely normal, you will receive your results only by: MyChart Message (if you have MyChart) OR A paper copy in the mail If you have any lab test that is abnormal or we need to change your treatment, we will call you to review the results.   Testing/Procedures: None ordered   Follow-Up: At [facility], you and your health needs are our priority.  As part of our continuing mission to provide you with exceptional heart care, we have created designated Provider Care Teams.  These Care Teams include your primary Cardiologist (physician) and Advanced Practice Providers (APPs -  Physician Assistants and Nurse Practitioners) who all work together to provide you with the care you need, when you need it.  We recommend signing up for the patient portal called "MyChart".  Sign up information is provided on this After Visit  Summary.  MyChart is used to connect with patients for Virtual Visits (Telemedicine).  Patients are able to view lab/test results, encounter notes, upcoming appointments, etc.  Non-urgent messages can be sent to your provider as well.   To learn more about what you can do with MyChart, go to [email or web address].    Your next appointment:   6 month(s)  The format for your next appointment:   In Person  Provider:   Thurmon Fair, MD      Signed, Thurmon Fair, MD  10/22/2020 8:54 AM    [facility] [address] [place], [place], [place]  [address] Phone: [phone]; Fax: [phone]

## 2020-10-22 NOTE — Patient Instructions (Signed)

## 2020-10-23 NOTE — Progress Notes (Signed)
Subjective:    Patient ID: [name], female    DOB: [birth year], 70 y.o.   MRN: [id number]  HPI Patient is a 70 y.o. female who presents for diabetes management and Libre 2.0 review. She is in good spirits and presents without assistance. Patient was last seen by Primary Care Provider on 09/19/20. Last seen in pharmacy clinic 10/03/20.  Patient needs to have an A1C of 7.5% or less to receive her knee replacement surgery. Patient states she feels her blood glucose readings have been "pretty good." She reports tolerating Ozempic 35m well.  Insurance coverage/medication affordability: Medicare  Family/Social history: patient currently unable to drive so her neighbor helps bring her to appointments  Current diabetes medications include: farxiga 12m insulin glargine (Basaglar) 50 units once daily, Ozempic (semaglutide) 1 mg once weekly Patient states that She is taking her medications as prescribed. Patient reports adherence with medications.   Do you feel that your medications are working for you?  yes  Have you been experiencing any side effects to the medications prescribed? no  Do you have any problems obtaining medications due to transportation or finances?  no    Patient reported dietary habits: Patient reports that she does not eat on a consistent schedule and sometimes may eat less than three meals a day. - Breakfast: Currently eating oatmeal with a small amount of sugar and cinnamon - Lunch: tuna sandwich - Dinner: air fried chicken with brown rice and broccoli - Snacks: animal crackers, low sugar canned fruits   Patient reports hypoglycemic events. Patient denies polyuria (increased urination).  Patient denies polyphagia (increased appetite).  Patient denies polydipsia (increased thirst).  Patient denies neuropathy (nerve pain). Patient denies visual changes. Patient reports self foot exams.   Freestyle Libre 2.0 patient education Patient taking >500 mg Vitamin C:  denies Reminded patient to not take Vitamin C with Freestyle Libre.  Sensor application -- sensor placed on left arm 1. Site selection and site prep with alcohol pad/Skin Tac 2. Sensor prep-sensor pack and sensor applicator 3. Starting the sensor: 1 hour warm up before BG readings available     Will ask for fingersticks the first 12 hours   4. Sensor change every 14 days and rotate site 5. Call [employer] customer service if sensor comes off before 14 days  Safety and Troubleshooting 1. Scan the sensor at least every 8 hours 2. When the "test BG" symbol appears, test fingerstick blood glucose prior to    making treatment decisions 3. Do a fingerstick blood glucose test if the sensor readings do not match how    you feel 4. Remove sensor prior for MRI or CT. Sensor may be damaged by exposure to    airport x-ray screening 5. Vitamin C may cause false high readings and aspirin may cause false low     readings 6. Store sensor kit between 39 and 77 degrees. Can be refrigerated at this temp.  Contact information provided for [employer] service and/or trainer.  Objective:   CGM report from last 14 days     Labs:   Physical Exam Neurological:     Mental Status: She is alert and oriented to person, place, and time.    Review of Systems  Gastrointestinal:  Negative for nausea and vomiting.   Lab Results  Component Value Date   HGBA1C 8.3 (A) 08/30/2020   HGBA1C 7.8 (A) 06/04/2020   HGBA1C 8.5 (A) 03/06/2020    Lipid Panel     Component  Value Date/Time   CHOL 113 01/31/2020 0916   TRIG 93 01/31/2020 0916   HDL 46 01/31/2020 0916   CHOLHDL 2.5 01/31/2020 0916   CHOLHDL 2.4 04/21/2016 1438   VLDL 15 04/21/2016 1438   LDLCALC 49 01/31/2020 0916   LDLDIRECT 94 12/07/2007 2019    Assessment/Plan:   T2DM is controlled based on Glucose Management Indicator of 7.1% likely due to optimal dietary choices and medication adherence. Will verify control with A1C next month.  Additional pharmacotherapy is not appropriate at this time. Will increase Ozempic from 16m to 2258monce weekly to help cover patient further for mealtime elevations in blood glucose. educated on purpose, proper use and potential adverse effects of Ozempic 58m37m Following instruction patient verbalized understanding of treatment plan. Freestyle Libre 2.0 CGM placed on patient's left arm successfully.  Continued basal insulin glargine (Lantus) 50 units once daily. Increased GLP-1 semaglutide (Ozempic) to 58mg68mce weekly (Saturdays).  Continue to use Libre 2.0 CGM Extensively discussed pathophysiology of diabetes, dietary effects on blood sugar control, and recommended lifestyle interventions. Counseled on s/sx of and management of hypoglycemia Next A1C anticipated August 2022.  Follow-up appointment in two weeks to replace CGM. Written patient instructions provided.  This appointment required 30 minutes of direct patient care.  Thank you for involving pharmacy to assist in providing this patient's care.  The patient is currently using Continuous Glucose Monitoring. The patient is injecting insulin 1 time a day and was previously testing blood glucose 1-4 times a day. The patient is making adjustments to their diabetes regimen based on glucose readings.

## 2020-10-24 ENCOUNTER — Ambulatory Visit: Payer: Medicare HMO | Admitting: Sports Medicine

## 2020-10-24 ENCOUNTER — Other Ambulatory Visit: Payer: Self-pay

## 2020-10-24 ENCOUNTER — Encounter: Payer: Self-pay | Admitting: Sports Medicine

## 2020-10-24 ENCOUNTER — Ambulatory Visit (INDEPENDENT_AMBULATORY_CARE_PROVIDER_SITE_OTHER): Payer: Medicare HMO | Admitting: Pharmacist

## 2020-10-24 DIAGNOSIS — B351 Tinea unguium: Secondary | ICD-10-CM | POA: Diagnosis not present

## 2020-10-24 DIAGNOSIS — M2142 Flat foot [pes planus] (acquired), left foot: Secondary | ICD-10-CM

## 2020-10-24 DIAGNOSIS — Z794 Long term (current) use of insulin: Secondary | ICD-10-CM

## 2020-10-24 DIAGNOSIS — M2042 Other hammer toe(s) (acquired), left foot: Secondary | ICD-10-CM

## 2020-10-24 DIAGNOSIS — M2141 Flat foot [pes planus] (acquired), right foot: Secondary | ICD-10-CM

## 2020-10-24 DIAGNOSIS — M79676 Pain in unspecified toe(s): Secondary | ICD-10-CM

## 2020-10-24 DIAGNOSIS — M79609 Pain in unspecified limb: Secondary | ICD-10-CM

## 2020-10-24 DIAGNOSIS — I739 Peripheral vascular disease, unspecified: Secondary | ICD-10-CM | POA: Diagnosis not present

## 2020-10-24 DIAGNOSIS — E1140 Type 2 diabetes mellitus with diabetic neuropathy, unspecified: Secondary | ICD-10-CM

## 2020-10-24 DIAGNOSIS — M2041 Other hammer toe(s) (acquired), right foot: Secondary | ICD-10-CM

## 2020-10-24 DIAGNOSIS — L84 Corns and callosities: Secondary | ICD-10-CM

## 2020-10-24 MED ORDER — OZEMPIC (2 MG/DOSE) 8 MG/3ML ~~LOC~~ SOPN: 2.0000 mg | PEN_INJECTOR | SUBCUTANEOUS | 2 refills | Status: DC

## 2020-10-24 NOTE — Progress Notes (Signed)
Subjective: [name] is a 70 y.o. female patient with history of diabetes who presents to office today complaining of long,mildly painful nails and callus while ambulating in shoes; unable to trim.   Patient on Eliquis.  FBS 107 doing much better and is hopeful to get knee surgery since her blood sugars have been under control. A1C around 7-8  PCP visit 1 month ago   Patient Active Problem List   Diagnosis Date Noted   Screen for colon cancer 09/19/2020   Left ear pain 06/25/2020   Chronic renal impairment, stage 3a ([place]) 06/04/2020   Pain in both lower extremities 05/01/2020   Iliotibial band syndrome, left 11/01/2018   GERD (gastroesophageal reflux disease) 03/23/2018   Chronic diastolic heart failure ([place]) 01/07/2018   Mild obesity 01/07/2018   Arthritis of carpometacarpal ([place]) joint of thumb 03/26/2016   Primary localized osteoarthritis of right knee    Visit for monitoring Tikosyn therapy 08/21/2015   Long term current use of anticoagulant therapy 08/21/2015   Ventricular tachycardia ([place]) 05/22/2015   Obesity (BMI 30-39.9) 01/04/2014   ICD (St. Jude Protecta dual-chamber),secondary prevention (VF arrest) January 2012 11/19/2012   Asthma, moderate persistent 12/10/2011   Diabetic neuropathy ([place]) 09/18/2011   Osteoarthritis of right knee 08/28/2008   Type 2 diabetes mellitus with diabetic neuropathy, with long-term current use of insulin ([place]) 06/10/2006   HYPERCHOLESTEROLEMIA 06/10/2006   Essential hypertension 06/10/2006   PAF (paroxysmal atrial fibrillation) ([place]) 06/10/2006   OSA on CPAP 06/10/2006   Current Outpatient Medications on File Prior to Visit  Medication Sig Dispense Refill   acetaminophen (TYLENOL 8 HOUR) 650 MG CR tablet Take 1 tablet (650 mg total) by mouth every 8 (eight) hours as needed for pain. 90 tablet 2   bisoprolol (ZEBETA) 10 MG tablet Take 1 tablet (10 mg total) by mouth in the morning and at bedtime. 180 tablet 1    budesonide-formoterol (SYMBICORT) 80-4.5 MCG/ACT inhaler INHALE 2 PUFFS INTO THE LUNGS IN THE MORNING AND AT BEDTIME 10.2 g 11   buPROPion (WELLBUTRIN XL) 150 MG 24 hr tablet Take 1 tablet (150 mg total) by mouth daily. 90 tablet 3   diclofenac Sodium (VOLTAREN) 1 % GEL Apply 2 g topically 4 (four) times daily. 100 g 1   dofetilide (TIKOSYN) 250 MCG capsule TAKE 1 CAPSULE(250 MCG) BY MOUTH TWICE DAILY 180 capsule 3   ELIQUIS 5 MG TABS tablet TAKE 1 TABLET(5 MG) BY MOUTH TWICE DAILY 180 tablet 1   famotidine (PEPCID) 20 MG tablet Take 1 tablet (20 mg total) by mouth daily. 90 tablet 3   FARXIGA 10 MG TABS tablet TAKE 1 TABLET(10 MG) BY MOUTH DAILY BEFORE AND BREAKFAST 90 tablet 1   fluticasone (FLONASE) 50 MCG/ACT nasal spray instill 1 spray into each nostril twice a day 16 g 5   glucose blood test strip Use as instructed 100 each 12   Insulin Glargine (BASAGLAR KWIKPEN) 100 UNIT/ML Inject 50 Units into the skin daily. 15 mL 2   Insulin Pen Needle (B-D ULTRAFINE III SHORT PEN) 31G X 8 MM MISC Use as directed up to 4 times daily to check blood sugar 100 each 9   levalbuterol (XOPENEX HFA) 45 MCG/ACT inhaler INHALE 1 TO 2 PUFFS BY MOUTH INTO THE LUNGS EVERY 4 HOURS IF NEEDED FOR WHEEZING 1 Inhaler 1   levalbuterol (XOPENEX) 0.63 MG/3ML nebulizer solution One vial in nebulizer four times daily as needed 360 mL 5   montelukast (SINGULAIR) 10 MG tablet TAKE  1 TABLET BY MOUTH AT BEDTIME 90 tablet 1   polyethylene glycol powder (GLYCOLAX/MIRALAX) 17 GM/SCOOP powder take 17GM (DISSOLVED IN WATER) by mouth once daily THREE TIMES A WEEK if needed as directed 500 g 3   potassium chloride SA (KLOR-CON) 20 MEQ tablet Take 1 tablet (20 mEq total) by mouth daily as directed. (Patient taking differently: Take 1 tablet (20 mEq total) by mouth daily) 30 tablet 11   PRODIGY LANCETS 28G MISC 1 Units by Does not apply route 4 (four) times daily. 100 each 4   rosuvastatin (CRESTOR) 10 MG tablet TAKE 1 TABLET BY MOUTH  DAILY 90 tablet 3   torsemide (DEMADEX) 20 MG tablet Take 1 tablet (20 mg total) by mouth daily. 180 tablet 1   No current facility-administered medications on file prior to visit.   Allergies  Allergen Reactions   Avelox [Moxifloxacin Hcl In Nacl] Other (See Comments)    Cardiac arrest per pt   Nsaids Other (See Comments)    Cardiac arrest per pt    Simvastatin Other (See Comments)   Ace Inhibitors Other (See Comments) and Cough   Jardiance [Empagliflozin] Other (See Comments)    Felt "crazy", fatigue, sweating, denies hypoglycemia while taking   Latex Rash    Recent Results (from the past 2160 hour(s))  CUP PACEART REMOTE DEVICE CHECK     Status: None   Collection Time: 08/21/20  3:34 AM  Result Value Ref Range   Date Time Interrogation Session [id number]    Pulse Generator Manufacturer MERM    Pulse Gen Model [id number] Evera MRI XT DR    Pulse Gen Serial Number [id number]    Clinic Name [facility]    Implantable Pulse Generator Type Implantable Cardiac Defibulator    Implantable Pulse Generator Implant Date [id number]    Implantable Lead Manufacturer [facility]    Implantable Lead Model 5076 CapSureFix Novus    Implantable Lead Serial Number [id number]    Implantable Lead Implant Date [id number]    Implantable Lead Location [id number]    Implantable Lead Manufacturer [facility]    Implantable Lead Model 6935 Sprint Quattro Secure S    Implantable Lead Serial Number [id number]    Implantable Lead Implant Date [id number]    Implantable Lead Location [id number]    Lead Channel Setting Sensing Sensitivity 0.3 mV   Lead Channel Setting Pacing Amplitude 2 V   Lead Channel Setting Pacing Pulse Width 0.4 ms   Lead Channel Setting Pacing Amplitude 2.5 V   Lead Channel Impedance Value 437 ohm   Lead Channel Sensing Intrinsic Amplitude 1.5 mV   Lead Channel Sensing Intrinsic Amplitude 1.5 mV   Lead Channel Pacing Threshold Amplitude 0.5 V   Lead Channel Pacing Threshold Pulse Width 0.4 ms   Lead  Channel Impedance Value 437 ohm   Lead Channel Impedance Value 342 ohm   Lead Channel Sensing Intrinsic Amplitude 12.25 mV   Lead Channel Sensing Intrinsic Amplitude 12.25 mV   Lead Channel Pacing Threshold Amplitude 0.75 V   Lead Channel Pacing Threshold Pulse Width 0.4 ms   HighPow Impedance 77 ohm   Battery Status OK    Battery Remaining Longevity 69 mo   Battery Voltage 2.99 V   Brady Statistic RA Percent Paced 95.65 %   Brady Statistic RV Percent Paced 0.66 %   Brady Statistic AP VP Percent 0.6 %   Brady Statistic AS VP Percent 0.01 %   Brady Statistic AP VS Percent 96.03 %   [employer] AS  VS Percent 3.35 %  HgB A1c     Status: Abnormal   Collection Time: 08/30/20  8:47 AM  Result Value Ref Range   Hemoglobin A1C     HbA1c POC (<> result, manual entry)     HbA1c, POC (prediabetic range)     HbA1c, POC (controlled diabetic range) 8.3 (A) 0.0 - 7.0 %  Basic Metabolic Panel     Status: Abnormal   Collection Time: 08/30/20  9:30 AM  Result Value Ref Range   Glucose 152 (H) 65 - 99 mg/dL   BUN 18 8 - 27 mg/dL   Creatinine, Ser 1.32 (H) 0.57 - 1.00 mg/dL   eGFR 44 (L) >59 mL/min/1.73   BUN/Creatinine Ratio 14 12 - 28   Sodium 140 134 - 144 mmol/L   Potassium 4.1 3.5 - 5.2 mmol/L   Chloride 101 96 - 106 mmol/L   CO2 24 20 - 29 mmol/L   Calcium 8.7 8.7 - 10.3 mg/dL  Basic Metabolic Panel     Status: Abnormal   Collection Time: 09/19/20  9:44 AM  Result Value Ref Range   Glucose 136 (H) 65 - 99 mg/dL   BUN 23 8 - 27 mg/dL   Creatinine, Ser 1.31 (H) 0.57 - 1.00 mg/dL   eGFR 44 (L) >59 mL/min/1.73   BUN/Creatinine Ratio 18 12 - 28   Sodium 142 134 - 144 mmol/L   Potassium 4.4 3.5 - 5.2 mmol/L   Chloride 101 96 - 106 mmol/L   CO2 25 20 - 29 mmol/L   Calcium 8.6 (L) 8.7 - 10.3 mg/dL    Objective: General: Patient is awake, alert, and oriented x 3 and in no acute distress.  Integument: Skin is warm, dry and supple bilateral. Nails are tender, long, thickened and  dystrophic with subungual debris, consistent with onychomycosis, 1-5 bilateral. No signs of infection. + callus hallux, sub 3-4, and 5th toes bilateral. Remaining integument unremarkable.  Vasculature:  Dorsalis Pedis pulse 1/4 bilateral. Posterior Tibial pulse  1/4 bilateral. Capillary fill time <3 sec 1-5 bilateral. Scant hair growth to the level of the digits.Temperature gradient within normal limits. No varicosities present bilateral. No edema present bilateral.   Neurology: The patient has slighty diminished sensation measured with a 5.07/10g Semmes Weinstein Monofilament at all pedal sites bilateral . Vibratory sensation diminished bilateral with tuning fork. No Babinski sign present bilateral.   Musculoskeletal:  Asymptomatic pes planus, bunion and hammertoe pedal deformities noted bilateral. Muscular strength 5/5 in all lower extremity muscular groups bilateral without pain on range of motion. No tenderness with calf compression bilateral.  Assessment and Plan: Problem List Items Addressed This Visit       Endocrine   Type 2 diabetes mellitus with diabetic neuropathy, with long-term current use of insulin ([place])   Other Visit Diagnoses     Pain due to onychomycosis of nail    -  Primary   Corns and callosities       Pes planus of both feet       Hammer toes of both feet       Peripheral arterial disease ([place])          -Examined patient. -Re-Educated patient on diabetic foot care, especially with regards to the vascular, neurological and musculoskeletal systems.  -Mechanically debrided callus >5 using sterile chisel blade and debrided all nails 1-5 bilateral using sterile nail nipper and filed with dremel without incident and no additional charge like previous -Continue with daily skin emollients for  callus areas like previous  -Patient to return  in 3 months for at risk foot care -Patient advised to call the office if any problems or questions arise in the meantime.  Landis Martins, DPM

## 2020-10-24 NOTE — Patient Instructions (Signed)
[name] it was a pleasure seeing you today.   The sensor is small waterproof disc that is placed on the back of the upper arm.  There is a very thin filament that is inserted under the surface of the skin and measures the amount of glucose in the interstitial fluid.  This system collects your sugar levels for up to 14 days and it automatically records the glucose level every 15 minutes. This will show your provider any patterns in your glucose levels.  Please remember... 1. Sensor will last 14 days 2. Sensor should be applied to area away from scarring, tattoos, irritation, and bones. 3. Starting the sensor: 1 hour warm up before BG readings available   4. Scan the sensor at least every 8 hours 5. Hold reader within 1.5 inches of sensor to scan 6. When the blood drop and magnifying glass symbol appears, test fingerstick blood glucose prior to making treatment decisions 7. Do a fingerstick blood glucose test if the sensor readings do not match how you feel 8. Remove sensor prior to magnetic resonance imaging (MRI), computed tomography (CT) scan, or high-frequency electrical heat (diathermy) treatment. 9. Freestyle Libre may be worn through a Environmental education officer. It may not be exposed to an advanced Imaging Technology (AIT) body scanner (also called a millimeter wave scanner) or the baggage x-ray machine. Instead, ask for hand-wanding or full-body pat-down and visual inspection.  10. Doses of vitamin C (ascorbic acid) >500 mg every day may cause false high readings. 11. Do not submerge more than 3 feet or keep underwater longer than 30 minutes at a time. Gently pat to dry.  12. Store sensor kit between 39 and 77 degrees Farenheit. Can be refrigerated within this temperature range.  Problems with Freestyle Libre sticking? 1. Order Tegaderm I.V. films to place directly over [facility] sensor on arm. 2. May also order Skin Tac from [facility]. Alcohol swab area you plan to administer  Freestyle Libre then let dry. Once dry, apply Skin Tac in a circular motion (with a spot in the middle for sensor without skin tac) and let dry. Once dry you can apply Freestyle Libre   Problems taking off Freestyle [place]? 1. Remember to try to shower before removing Freestyle Libre 2. Order Tac Away to help remove any extra adhesive left on your skin once you remove Freestyle Libre 3. May also try baby oil to loosen adhesive  [place] Phone number: [phone] Available 7 days a week; excluding holidays 8 AM to 8PM EST  [email or web address]  Please do the following:  Increase Ozempic to 69m once weekly on Saturdays as directed today during your appointment. If you have any questions or if you believe something has occurred because of this change, call me or your doctor to let one of [email or web address].  Continue checking blood sugars at home. It's really important that you record these and bring these in to your next doctor's appointment.  Continue making the lifestyle changes we've discussed together during our visit. Diet and exercise play a significant role in improving your blood sugars.  Follow-up with me in two weeks  Hypoglycemia or low blood sugar:   Low blood sugar can happen quickly and may become an emergency if not treated right away.   While this shouldn't happen often, it can be brought upon if you skip a meal or do not eat enough. Also, if your insulin or other diabetes medications are dosed too high, this can cause your  blood sugar to go to low.   Warning signs of low blood sugar include: Feeling shaky or dizzy Feeling weak or tired  Excessive hunger Feeling anxious or upset  Sweating even when you aren't exercising  What to do if I experience low blood sugar? Follow the Rule of 15 Check your blood sugar. If lower than 70, proceed to step 2.  Treat with 15 grams of fast acting carbs which is found in 3-4 glucose tablets. If none are available you can try  hard candy, 1 tablespoon of sugar or honey,4 ounces of fruit juice, or 6 ounces of REGULAR soda.  Re-check your sugar in 15 minutes. If it is still below 70, do what you did in step 2 again. If your blood sugar has come back up, go ahead and eat a snack or small meal made up of complex carbs (ex. Whole grains) and protein at this time to avoid recurrence of low blood sugar.

## 2020-10-24 NOTE — Assessment & Plan Note (Signed)
T2DM is controlled based on Glucose Management Indicator of 7.1% likely due to optimal dietary choices and medication adherence. Will verify control with A1C next month. Additional pharmacotherapy is not appropriate at this time. Will increase Ozempic from 1mg  to 2mg  once weekly to help cover patient further for mealtime elevations in blood glucose. educated on purpose, proper use and potential adverse effects of Ozempic 2mg .  Following instruction patient verbalized understanding of treatment plan. Freestyle Libre 2.0 CGM placed on patient's left arm successfully.  1. Continued basal insulin glargine (Lantus) 50 units once daily. 2. Increased GLP-1 semaglutide (Ozempic) to 2mg  once weekly (Saturdays).  3. Continue to use Libre 2.0 CGM 4. Extensively discussed pathophysiology of diabetes, dietary effects on blood sugar control, and recommended lifestyle interventions. 5. Counseled on s/sx of and management of hypoglycemia 6. Next A1C anticipated August 2022.

## 2020-10-28 DIAGNOSIS — Z1211 Encounter for screening for malignant neoplasm of colon: Secondary | ICD-10-CM | POA: Diagnosis not present

## 2020-10-28 DIAGNOSIS — Z1212 Encounter for screening for malignant neoplasm of rectum: Secondary | ICD-10-CM | POA: Diagnosis not present

## 2020-11-04 LAB — COLOGUARD: Cologuard: NEGATIVE

## 2020-11-05 DIAGNOSIS — G4733 Obstructive sleep apnea (adult) (pediatric): Secondary | ICD-10-CM | POA: Diagnosis not present

## 2020-11-05 DIAGNOSIS — J45909 Unspecified asthma, uncomplicated: Secondary | ICD-10-CM | POA: Diagnosis not present

## 2020-11-05 DIAGNOSIS — J449 Chronic obstructive pulmonary disease, unspecified: Secondary | ICD-10-CM | POA: Diagnosis not present

## 2020-11-07 ENCOUNTER — Other Ambulatory Visit: Payer: Self-pay

## 2020-11-07 ENCOUNTER — Ambulatory Visit (INDEPENDENT_AMBULATORY_CARE_PROVIDER_SITE_OTHER): Payer: Medicare HMO | Admitting: Pharmacist

## 2020-11-07 DIAGNOSIS — E1140 Type 2 diabetes mellitus with diabetic neuropathy, unspecified: Secondary | ICD-10-CM

## 2020-11-07 DIAGNOSIS — Z794 Long term (current) use of insulin: Secondary | ICD-10-CM | POA: Diagnosis not present

## 2020-11-07 NOTE — Assessment & Plan Note (Signed)
T2DM is controlled based on Glucose Management Indicator of 6.8%, which is lowest number patient has had since placing the Combs on her arm. This is likely due to optimal dietary choices and medication adherence. Will verify control with A1C next month. Additional pharmacotherapy is not appropriate at this time. Following instruction patient verbalized understanding of treatment plan. Freestyle Libre 2.0 CGM placed on patient's right arm successfully.  1. Continued basal insulin glargine (Lantus) 50 units once daily. 2. Continued GLP-1 semaglutide (Ozempic) to 2mg  once weekly (Saturdays).  3. Continue to use Libre 2.0 CGM 4. Extensively discussed pathophysiology of diabetes, dietary effects on blood sugar control, and recommended lifestyle interventions. 5. Counseled on s/sx of and management of hypoglycemia 6. Next A1C anticipated August 2022.  Follow-up appointment in two weeks to replace CGM. Written patient instructions provided.  This appointment required 30 minutes of direct patient care.

## 2020-11-07 NOTE — Patient Instructions (Signed)
Ms. Penalver it was a pleasure seeing you today.   Please do the following:  Continue your current medications as directed today during your appointment. If you have any questions or if you believe something has occurred because of this change, call me or your doctor to let one of [place] know.  Continue checking blood sugars at home. It's really important that you record these and bring these in to your next doctor's appointment.  Continue making the lifestyle changes we've discussed together during our visit. Diet and exercise play a significant role in improving your blood sugars.  Follow-up with me on July 9th  Hypoglycemia or low blood sugar:   Low blood sugar can happen quickly and may become an emergency if not treated right away.   While this shouldn't happen often, it can be brought upon if you skip a meal or do not eat enough. Also, if your insulin or other diabetes medications are dosed too high, this can cause your blood sugar to go to low.   Warning signs of low blood sugar include: Feeling shaky or dizzy Feeling weak or tired  Excessive hunger Feeling anxious or upset  Sweating even when you aren't exercising  What to do if I experience low blood sugar? Follow the Rule of 15 Check your blood sugar with your meter. If lower than 70, proceed to step 2.  Treat with 15 grams of fast acting carbs which is found in 3-4 glucose tablets. If none are available you can try hard candy, 1 tablespoon of sugar or honey,4 ounces of fruit juice, or 6 ounces of REGULAR soda.  Re-check your sugar in 15 minutes. If it is still below 70, do what you did in step 2 again. If your blood sugar has come back up, go ahead and eat a snack or small meal made up of complex carbs (ex. Whole grains) and protein at this time to avoid recurrence of low blood sugar.

## 2020-11-07 NOTE — Progress Notes (Signed)
Subjective:    Patient ID: [name], female    DOB: Mar 17, 1951, 70 y.o.   MRN: [id number]  HPI Patient is a 70 y.o. female who presents for diabetes management and Libre 2.0 review. She is in good spirits and presents without assistance. Patient was last seen by Primary Care Provider on 09/19/20. Last seen in pharmacy clinic 10/24/20.  Patient needs to have an A1C of 7.5% or less to receive her knee replacement surgery. Patient states she feels her blood glucose readings have been "pretty good." At last visit Ozempic was increased to 33m and she reports tolerating it well with no side effects.  Insurance coverage/medication affordability: Medicare  Family/Social history: patient currently unable to drive so her neighbor helps bring her to appointments  Current diabetes medications include: farxiga 163m insulin glargine (Basaglar) 50 units once daily, Ozempic (semaglutide) 2 mg once weekly Patient states that She is taking her medications as prescribed. Patient reports adherence with medications.   Do you feel that your medications are working for you?  yes  Have you been experiencing any side effects to the medications prescribed? no  Do you have any problems obtaining medications due to transportation or finances?  no    Patient reported dietary habits: Patient reports that she does not eat on a consistent schedule and sometimes may eat less than three meals a day. - Breakfast: Currently eating oatmeal with a small amount of sugar and cinnamon - Lunch: tuna sandwich - Dinner: air fried chicken with brown rice and broccoli - Snacks: animal crackers, low sugar canned fruits   Patient denies hypoglycemic events. Patient denies polyuria (increased urination).  Patient denies polyphagia (increased appetite).  Patient denies polydipsia (increased thirst).  Patient denies neuropathy (nerve pain). Patient denies visual changes. Patient reports self foot exams.   Freestyle Libre 2.0  patient education Patient taking >500 mg Vitamin C: denies Reminded patient to not take Vitamin C with Freestyle Libre.  Sensor application -- sensor placed on right arm 1. Site selection and site prep with alcohol pad/Skin Tac 2. Sensor prep-sensor pack and sensor applicator 3. Starting the sensor: 1 hour warm up before BG readings available     Will ask for fingersticks the first 12 hours   4. Sensor change every 14 days and rotate site 5. Call [employer] customer service if sensor comes off before 14 days  Safety and Troubleshooting 1. Scan the sensor at least every 8 hours 2. When the "test BG" symbol appears, test fingerstick blood glucose prior to    making treatment decisions 3. Do a fingerstick blood glucose test if the sensor readings do not match how    you feel 4. Remove sensor prior for MRI or CT. Sensor may be damaged by exposure to    airport x-ray screening 5. Vitamin C may cause false high readings and aspirin may cause false low     readings 6. Store sensor kit between 39 and 77 degrees. Can be refrigerated at this temp.  Contact information provided for [employer] service and/or trainer.  Objective:   CGM report from last 14 days      Labs:   Physical Exam Neurological:     Mental Status: She is alert and oriented to person, place, and time.    Review of Systems  Gastrointestinal:  Negative for nausea and vomiting.   Lab Results  Component Value Date   HGBA1C 8.3 (A) 08/30/2020   HGBA1C 7.8 (A) 06/04/2020   HGBA1C  8.5 (A) 03/06/2020    Lipid Panel     Component Value Date/Time   CHOL 113 01/31/2020 0916   TRIG 93 01/31/2020 0916   HDL 46 01/31/2020 0916   CHOLHDL 2.5 01/31/2020 0916   CHOLHDL 2.4 04/21/2016 1438   VLDL 15 04/21/2016 1438   LDLCALC 49 01/31/2020 0916   LDLDIRECT 94 12/07/2007 2019    Assessment/Plan:   T2DM is controlled based on Glucose Management Indicator of 6.8%, which is lowest number patient has had since  placing the [place] on her arm. This is likely due to optimal dietary choices and medication adherence. Will verify control with A1C next month. Additional pharmacotherapy is not appropriate at this time. Following instruction patient verbalized understanding of treatment plan. Freestyle Libre 2.0 CGM placed on patient's right arm successfully.  Continued basal insulin glargine (Lantus) 50 units once daily. Continued GLP-1 semaglutide (Ozempic) to 48m once weekly (Saturdays).  Continue to use Libre 2.0 CGM Extensively discussed pathophysiology of diabetes, dietary effects on blood sugar control, and recommended lifestyle interventions. Counseled on s/sx of and management of hypoglycemia Next A1C anticipated August 2022.  Follow-up appointment in two weeks to replace CGM. Written patient instructions provided.  This appointment required 30 minutes of direct patient care.  Thank you for involving pharmacy to assist in providing this patient's care.  The patient is currently using Continuous Glucose Monitoring. The patient is injecting insulin 1 time a day and was previously testing blood glucose 1-4 times a day. The patient is making adjustments to their diabetes regimen based on glucose readings.

## 2020-11-12 ENCOUNTER — Telehealth: Payer: Self-pay

## 2020-11-12 NOTE — Telephone Encounter (Signed)
Spoke with patient.  Advised to send remote transmission to review monthly fluid levels.  She is getting an error on the reader.  Provided tech support number and encouraged to call for assistance. Rescheduled ICM monthly follow up to 11/18/2020.

## 2020-11-13 DIAGNOSIS — J45909 Unspecified asthma, uncomplicated: Secondary | ICD-10-CM | POA: Diagnosis not present

## 2020-11-13 DIAGNOSIS — J449 Chronic obstructive pulmonary disease, unspecified: Secondary | ICD-10-CM | POA: Diagnosis not present

## 2020-11-13 DIAGNOSIS — G4733 Obstructive sleep apnea (adult) (pediatric): Secondary | ICD-10-CM | POA: Diagnosis not present

## 2020-11-13 DIAGNOSIS — R5381 Other malaise: Secondary | ICD-10-CM | POA: Diagnosis not present

## 2020-11-13 DIAGNOSIS — I509 Heart failure, unspecified: Secondary | ICD-10-CM | POA: Diagnosis not present

## 2020-11-15 ENCOUNTER — Ambulatory Visit (INDEPENDENT_AMBULATORY_CARE_PROVIDER_SITE_OTHER): Payer: Medicare HMO

## 2020-11-15 DIAGNOSIS — I5032 Chronic diastolic (congestive) heart failure: Secondary | ICD-10-CM

## 2020-11-15 DIAGNOSIS — Z95810 Presence of automatic (implantable) cardiac defibrillator: Secondary | ICD-10-CM

## 2020-11-15 NOTE — Progress Notes (Signed)
EPIC Encounter for ICM Monitoring  Patient Name: [name] is a 70 y.o. female Date: 11/15/2020 Primary Care Physican: Maury Dus, MD Primary Cardiologist: Croitoru Electrophysiologist: Croitoru 11/15/2020 Weight: 189-190 lbs   AT/AF 69 Time in AT/AF 0.5 hr/day (2.0%)  (taking Eliquis) Longest AT/AF 29 minutes                                                    Spoke with patient and heart failure questions reviewed.  Pt asymptomatic for fluid accumulation and feeling well.  She has been drinking more fluids lately which may contribute to decreased impedance.   Optivol thoracic impedance suggested possible fluid accumulation starting 11/08/2020 and trending back toward baseline.   Prescribed dosage:  Torsemide 20 mg 1 tablet daily.   Potassium 20 mEq 1 tablet by mouth as directed.   Labs: 09/19/2020 Creatinine 1.31, BUN 23, Potassium 4.4, Sodium 142, GFR 44 08/30/2020 Creatinine 1.32, BUN 18, Potassium 4.1, Sodium 140, GFR 44 07/23/2020 Creatinine 1.11, BUN 17, Potassium 4.1, Sodium 139, GFR 54 07/11/2020 Creatinine 1.31, BUN 16, Potassium 4.8, Sodium 139, GFR 44 06/04/2020 Creatinine 1.28, BUN 21, Potassium 4.3, Sodium 141  A complete set of results can be found in Results Review.   Recommendations:  Recommendation to limit fluid intake to 64 oz daily.  Encouraged to call if experiencing any fluid symptoms.    Follow-up plan: ICM clinic phone appointment on 11/20/2020 to recheck fluid levels.   91 day device clinic remote transmission 11/20/2020.     Next office visit scheduled:  Recall 04/20/2021 for Dr Royann Shivers.     Copy of ICM check sent to Dr. Royann Shivers.   3 month ICM trend: 11/15/2020.    1 Year ICM trend:       [name], RN 11/15/2020 11:52 AM

## 2020-11-19 ENCOUNTER — Other Ambulatory Visit: Payer: Self-pay

## 2020-11-19 ENCOUNTER — Ambulatory Visit
Admission: RE | Admit: 2020-11-19 | Discharge: 2020-11-19 | Disposition: A | Discharge status: Home / Self Care | Payer: Medicare HMO | Source: Ambulatory Visit | Attending: *Deleted | Admitting: *Deleted

## 2020-11-19 ENCOUNTER — Ambulatory Visit (INDEPENDENT_AMBULATORY_CARE_PROVIDER_SITE_OTHER): Payer: Medicare HMO | Admitting: Pharmacist

## 2020-11-19 DIAGNOSIS — E1140 Type 2 diabetes mellitus with diabetic neuropathy, unspecified: Secondary | ICD-10-CM | POA: Diagnosis not present

## 2020-11-19 DIAGNOSIS — Z794 Long term (current) use of insulin: Secondary | ICD-10-CM | POA: Diagnosis not present

## 2020-11-19 DIAGNOSIS — Z1231 Encounter for screening mammogram for malignant neoplasm of breast: Secondary | ICD-10-CM | POA: Diagnosis not present

## 2020-11-19 NOTE — Patient Instructions (Signed)
[name] it was a pleasure seeing you today.   The sensor is small waterproof disc that is placed on the back of the upper arm.  There is a very thin filament that is inserted under the surface of the skin and measures the amount of glucose in the interstitial fluid.  This system collects your sugar levels for up to 14 days and it automatically records the glucose level every 15 minutes. This will show your provider any patterns in your glucose levels.  Please remember... 1. Sensor will last 14 days 2. Sensor should be applied to area away from scarring, tattoos, irritation, and bones. 3. Starting the sensor: 1 hour warm up before BG readings available   4. Scan the sensor at least every 8 hours 5. Hold reader within 1.5 inches of sensor to scan 6. When the blood drop and magnifying glass symbol appears, test fingerstick blood glucose prior to making treatment decisions 7. Do a fingerstick blood glucose test if the sensor readings do not match how you feel 8. Remove sensor prior to magnetic resonance imaging (MRI), computed tomography (CT) scan, or high-frequency electrical heat (diathermy) treatment. 9. Freestyle Libre may be worn through a Environmental education officer. It may not be exposed to an advanced Imaging Technology (AIT) body scanner (also called a millimeter wave scanner) or the baggage x-ray machine. Instead, ask for hand-wanding or full-body pat-down and visual inspection.  10. Doses of vitamin C (ascorbic acid) >500 mg every day may cause false high readings. 11. Do not submerge more than 3 feet or keep underwater longer than 30 minutes at a time. Gently pat to dry.  12. Store sensor kit between 39 and 77 degrees Farenheit. Can be refrigerated within this temperature range.  Problems with Freestyle Libre sticking? 1. Order Tegaderm I.V. films to place directly over [facility] sensor on arm. 2. May also order Skin Tac from [facility]. Alcohol swab area you plan to administer  Freestyle Libre then let dry. Once dry, apply Skin Tac in a circular motion (with a spot in the middle for sensor without skin tac) and let dry. Once dry you can apply Freestyle Libre   Problems taking off Freestyle Coates? 1. Remember to try to shower before removing Freestyle Libre 2. Order Tac Away to help remove any extra adhesive left on your skin once you remove Freestyle Libre 3. May also try baby oil to loosen adhesive  [place] Phone number: [phone] Available 7 days a week; excluding holidays 8 AM to 8PM EST  [email or web address]  Please do the following:  Continue medications as directed today during your appointment. If you have any questions or if you believe something has occurred because of this change, call me or your doctor to let one of [place] know.  Continue checking blood sugars at home. It's really important that you record these and bring these in to your next doctor's appointment.  Continue making the lifestyle changes we've discussed together during our visit. Diet and exercise play a significant role in improving your blood sugars.  Follow-up with me in two weeks.   Hypoglycemia or low blood sugar:   Low blood sugar can happen quickly and may become an emergency if not treated right away.   While this shouldn't happen often, it can be brought upon if you skip a meal or do not eat enough. Also, if your insulin or other diabetes medications are dosed too high, this can cause your blood sugar to go to  low.   Warning signs of low blood sugar include: Feeling shaky or dizzy Feeling weak or tired  Excessive hunger Feeling anxious or upset  Sweating even when you aren't exercising  What to do if I experience low blood sugar? Follow the Rule of 15 Check your blood sugar. If lower than 70, proceed to step 2.  Treat with 15 grams of fast acting carbs which is found in 3-4 glucose tablets. If none are available you can try hard candy, 1 tablespoon  of sugar or honey,4 ounces of fruit juice, or 6 ounces of REGULAR soda.  Re-check your sugar in 15 minutes. If it is still below 70, do what you did in step 2 again. If your blood sugar has come back up, go ahead and eat a snack or small meal made up of complex carbs (ex. Whole grains) and protein at this time to avoid recurrence of low blood sugar.

## 2020-11-19 NOTE — Assessment & Plan Note (Signed)
T2DM is controlled based on Glucose Management Indicator of 6.8%, which is it has been now for the past month. This is likely due to optimal dietary choices and medication adherence. Will verify control with A1C at next visit. Additional pharmacotherapy is not appropriate at this time. Following instruction patient verbalized understanding of treatment plan. Freestyle Libre 2.0 CGM placed on patient's left arm successfully.  1. Continued basal insulin glargine (Lantus) 50 units once daily. 2. Continued GLP-1 semaglutide (Ozempic) to 2mg  once weekly (Saturdays).  3. Continue to use Libre 2.0 CGM 4. Extensively discussed pathophysiology of diabetes, dietary effects on blood sugar control, and recommended lifestyle interventions. 5. Counseled on s/sx of and management of hypoglycemia 6. Next A1C anticipated August 2022 at next appointment.  Follow-up appointment in two weeks to remove CGM. Written patient instructions provided.

## 2020-11-19 NOTE — Progress Notes (Signed)
Subjective:    Patient ID: [name], female    DOB: 1950-09-14, 70 y.o.   MRN: [id number]  HPI Patient is a 70 y.o. female who presents for diabetes management and Libre 2.0 review. She is in good spirits and presents without assistance. Patient was last seen by Primary Care Provider on 09/19/20. Last seen in pharmacy clinic 11/07/20.  Patient needs to have an A1C of 7.5% or less to receive her knee replacement surgery.   Insurance coverage/medication affordability: Medicare  Family/Social history: patient currently unable to drive so her neighbor helps bring her to appointments  Current diabetes medications include: farxiga 69m, insulin glargine (Basaglar) 50 units once daily, Ozempic (semaglutide) 2 mg once weekly on Saturdays Patient states that She is taking her medications as prescribed. Patient reports adherence with medications.   Do you feel that your medications are working for you?  yes  Have you been experiencing any side effects to the medications prescribed? no  Do you have any problems obtaining medications due to transportation or finances?  no    Patient reported dietary habits: Patient reports that she does not eat on a consistent schedule and sometimes may eat less than three meals a day. - Breakfast: Currently eating oatmeal with a small amount of sugar and cinnamon - Lunch: tuna sandwich - Dinner: air fried chicken with brown rice and broccoli - Snacks: animal crackers, low sugar canned fruits   Patient reports hypoglycemic events and attributes likely to not eating enough. Patient denies polyuria (increased urination).  Patient denies polyphagia (increased appetite).  Patient denies polydipsia (increased thirst).  Patient denies neuropathy (nerve pain). Patient denies visual changes. Patient reports self foot exams.   Freestyle Libre 2.0 patient education Patient taking >500 mg Vitamin C: denies Reminded patient to not take Vitamin C with Freestyle  Libre.  Sensor application -- sensor placed on left arm 1. Site selection and site prep with alcohol pad/Skin Tac 2. Sensor prep-sensor pack and sensor applicator 3. Starting the sensor: 1 hour warm up before BG readings available     Will ask for fingersticks the first 12 hours   4. Sensor change every 14 days and rotate site 5. Call [employer] customer service if sensor comes off before 14 days  Safety and Troubleshooting 1. Scan the sensor at least every 8 hours 2. When the "test BG" symbol appears, test fingerstick blood glucose prior to    making treatment decisions 3. Do a fingerstick blood glucose test if the sensor readings do not match how    you feel 4. Remove sensor prior for MRI or CT. Sensor may be damaged by exposure to    airport x-ray screening 5. Vitamin C may cause false high readings and aspirin may cause false low     readings 6. Store sensor kit between 39 and 77 degrees. Can be refrigerated at this temp.  Contact information provided for APraxaircustomer service and/or trainer.  Objective:   CGM report from last 14 days      Labs:   Physical Exam Neurological:     Mental Status: She is alert and oriented to person, place, and time.   Review of Systems  Gastrointestinal:  Negative for nausea and vomiting.   Lab Results  Component Value Date   HGBA1C 8.3 (A) 08/30/2020   HGBA1C 7.8 (A) 06/04/2020   HGBA1C 8.5 (A) 03/06/2020    Lipid Panel     Component Value Date/Time   CHOL 113 01/31/2020 0916  TRIG 93 01/31/2020 0916   HDL 46 01/31/2020 0916   CHOLHDL 2.5 01/31/2020 0916   CHOLHDL 2.4 04/21/2016 1438   VLDL 15 04/21/2016 1438   LDLCALC 49 01/31/2020 0916   LDLDIRECT 94 12/07/2007 2019    Assessment/Plan:   T2DM is controlled based on Glucose Management Indicator of 6.8%, which is it has been now for the past month. This is likely due to optimal dietary choices and medication adherence. Will verify control with A1C at next visit.  Additional pharmacotherapy is not appropriate at this time. Following instruction patient verbalized understanding of treatment plan. Freestyle Libre 2.0 CGM placed on patient's left arm successfully.  Continued basal insulin glargine (Lantus) 50 units once daily. Continued GLP-1 semaglutide (Ozempic) to 59m once weekly (Saturdays).  Continue to use Libre 2.0 CGM Extensively discussed pathophysiology of diabetes, dietary effects on blood sugar control, and recommended lifestyle interventions. Counseled on s/sx of and management of hypoglycemia Next A1C anticipated August 2022 at next appointment.  Follow-up appointment in two weeks to remove CGM. Written patient instructions provided.  This appointment required 20 minutes of direct patient care.  Thank you for involving pharmacy to assist in providing this patient's care.  Patient seen with MMeyer Russel PharmD Candidate, and RJoseph Art PharmD - PGY-1 Resident.   The patient is currently using Continuous Glucose Monitoring. The patient is injecting insulin 1 time a day and was previously testing blood glucose 1-4 times a day. The patient is making adjustments to their diabetes regimen based on glucose readings.

## 2020-11-20 ENCOUNTER — Ambulatory Visit (INDEPENDENT_AMBULATORY_CARE_PROVIDER_SITE_OTHER): Payer: Medicare HMO

## 2020-11-20 DIAGNOSIS — I472 Ventricular tachycardia, unspecified: Secondary | ICD-10-CM

## 2020-11-20 DIAGNOSIS — I5032 Chronic diastolic (congestive) heart failure: Secondary | ICD-10-CM | POA: Diagnosis not present

## 2020-11-20 DIAGNOSIS — Z95810 Presence of automatic (implantable) cardiac defibrillator: Secondary | ICD-10-CM

## 2020-11-20 NOTE — Progress Notes (Signed)
EPIC Encounter for ICM Monitoring  Patient Name: [name] is a 70 y.o. female Date: 11/20/2020 Primary Care Physican: Maury Dus, MD Primary Cardiologist: Croitoru Electrophysiologist: Croitoru 11/15/2020 Weight: 189-190 lbs    Time in AT/AF 0.0 hr/day (0.0%)                                                    Spoke with patient and heart failure questions reviewed.  Pt asymptomatic for fluid accumulation and feeling well.    Optivol thoracic impedance suggesting fluid levels returned normal.   Prescribed dosage:  Torsemide 20 mg 1 tablet daily.   Potassium 20 mEq 1 tablet by mouth as directed.   Labs: 09/19/2020 Creatinine 1.31, BUN 23, Potassium 4.4, Sodium 142, GFR 44 08/30/2020 Creatinine 1.32, BUN 18, Potassium 4.1, Sodium 140, GFR 44 07/23/2020 Creatinine 1.11, BUN 17, Potassium 4.1, Sodium 139, GFR 54 07/11/2020 Creatinine 1.31, BUN 16, Potassium 4.8, Sodium 139, GFR 44 06/04/2020 Creatinine 1.28, BUN 21, Potassium 4.3, Sodium 141  A complete set of results can be found in Results Review.   Recommendations:  Recommendation to limit fluid intake to 64 oz daily.  Encouraged to call if experiencing any fluid symptoms.   Follow-up plan: ICM clinic phone appointment on 12/17/2020.   91 day device clinic remote transmission 02/19/2021.     Next office visit scheduled:  Recall 04/20/2021 for Dr Royann Shivers.     Copy of ICM check sent to Dr. Royann Shivers.    3 month ICM trend: 11/20/2020.    1 Year ICM trend:       Diane Soda, RN 11/20/2020 4:28 PM

## 2020-11-21 LAB — CUP PACEART REMOTE DEVICE CHECK
Battery Remaining Longevity: 64 mo
Battery Voltage: 2.98 V
Brady Statistic AP VP Percent: 0.27 %
Brady Statistic AP VS Percent: 96.58 %
Brady Statistic AS VP Percent: 0.01 %
Brady Statistic AS VS Percent: 3.14 %
Brady Statistic RA Percent Paced: 95.38 %
Brady Statistic RV Percent Paced: 0.32 %
Date Time Interrogation Session: 20220810043625
HighPow Impedance: 83 Ohm
Implantable Lead Implant Date: 20120120
Implantable Lead Implant Date: 20120120
Implantable Lead Location: 753859
Implantable Lead Location: 753860
Implantable Lead Model: 5076
Implantable Lead Model: 6935
Implantable Pulse Generator Implant Date: 20190618
Lead Channel Impedance Value: 380 Ohm
Lead Channel Impedance Value: 437 Ohm
Lead Channel Impedance Value: 437 Ohm
Lead Channel Pacing Threshold Amplitude: 0.5 V
Lead Channel Pacing Threshold Amplitude: 0.75 V
Lead Channel Pacing Threshold Pulse Width: 0.4 ms
Lead Channel Pacing Threshold Pulse Width: 0.4 ms
Lead Channel Sensing Intrinsic Amplitude: 1.75 mV
Lead Channel Sensing Intrinsic Amplitude: 1.75 mV
Lead Channel Sensing Intrinsic Amplitude: 13 mV
Lead Channel Sensing Intrinsic Amplitude: 13 mV
Lead Channel Setting Pacing Amplitude: 2 V
Lead Channel Setting Pacing Amplitude: 2.5 V
Lead Channel Setting Pacing Pulse Width: 0.4 ms
Lead Channel Setting Sensing Sensitivity: 0.3 mV

## 2020-11-22 ENCOUNTER — Telehealth: Payer: Self-pay | Admitting: Pharmacist

## 2020-11-22 MED ORDER — OZEMPIC (2 MG/DOSE) 8 MG/3ML ~~LOC~~ SOPN: 2.0000 mg | PEN_INJECTOR | SUBCUTANEOUS | 2 refills | Status: DC

## 2020-11-22 NOTE — Telephone Encounter (Signed)
Patient called to state Ozempic 2mg  dose was not available at her pharmacy.   There is currently a shortage of Ozempic nationwide. [facility] and they are able to obtain medication. Will send in new script.

## 2020-12-03 ENCOUNTER — Ambulatory Visit (INDEPENDENT_AMBULATORY_CARE_PROVIDER_SITE_OTHER): Payer: Medicare HMO | Admitting: Pharmacist

## 2020-12-03 ENCOUNTER — Other Ambulatory Visit: Payer: Self-pay

## 2020-12-03 DIAGNOSIS — E1140 Type 2 diabetes mellitus with diabetic neuropathy, unspecified: Secondary | ICD-10-CM | POA: Diagnosis not present

## 2020-12-03 DIAGNOSIS — Z794 Long term (current) use of insulin: Secondary | ICD-10-CM

## 2020-12-03 LAB — POCT GLYCOSYLATED HEMOGLOBIN (HGB A1C): HbA1c, POC (controlled diabetic range): 7.7 % — AB (ref 0.0–7.0)

## 2020-12-03 MED ORDER — FREESTYLE LIBRE 2 SENSOR MISC: Status: DC

## 2020-12-03 MED ORDER — FREESTYLE LIBRE 2 READER DEVI: 0 refills | Status: DC

## 2020-12-03 NOTE — Assessment & Plan Note (Signed)
T2DM is not controlled based on in-office A1C today. Patient's CGM data projected A1C to be lower than result today. Slightly elevated A1C likely due to dietary choices. Patient will continue to make dietary improvement. Will add on 3 units of mealtime insulin with morning and evening meals as that is when patient tends to have elevations in blood glucose. Following instruction patient verbalized understanding of treatment plan. Freestyle Libre 2.0 CGM placed on patient's right arm successfully.  1. Continued basal insulin glargine (Lantus) 45 units once daily. 2. Provided sample of bolus insulin aspart (Fiasp) 3 units BID with breakfast and dinner 3. Continued GLP-1 semaglutide (Ozempic) to 2mg  once weekly (Saturdays).  4. Continued SGLT2 farxiga 10mg  daily 5. Continue to use Libre 2.0 CGM 6. Extensively discussed pathophysiology of diabetes, dietary effects on blood sugar control, and recommended lifestyle interventions. 7. Counseled on s/sx of and management of hypoglycemia 8. Next A1C anticipated next month due to requirements for surgery.   Follow-up appointment in two weeks to remove CGM. Written patient instructions provided.

## 2020-12-03 NOTE — Progress Notes (Signed)
Subjective:    Patient ID: [name], female    DOB: 1950/05/13, 70 y.o.   MRN: [id number]  HPI Patient is a 70 y.o. female who presents for diabetes management and Libre 2.0 review. She is in good spirits and presents without assistance. Patient was last seen by Primary Care Provider on 09/19/20. Last seen in pharmacy clinic 11/19/20.  Patient needs to have an A1C of 7.5% or less to receive her knee replacement surgery.   Insurance coverage/medication affordability: Medicare  Family/Social history: patient currently unable to drive so her neighbor helps bring her to appointments  Current diabetes medications include: farxiga 10mg , insulin glargine (Basaglar) 45 units once daily, Ozempic (semaglutide) 2 mg once weekly on Saturdays Patient states that She is taking her medications as prescribed. Patient reports adherence with medications.   Do you feel that your medications are working for you?  yes  Have you been experiencing any side effects to the medications prescribed? no  Do you have any problems obtaining medications due to transportation or finances?  no    Patient reported dietary habits: Patient reports that she does not eat on a consistent schedule and sometimes may eat less than three meals a day. - Breakfast: Currently eating oatmeal with a small amount of sugar and cinnamon - Lunch: tuna sandwich - Dinner: air fried chicken with brown rice and broccoli - Snacks: animal crackers, low sugar canned fruits   Patient denies hypoglycemic event Patient denies polyuria (increased urination).  Patient denies polyphagia (increased appetite).  Patient denies polydipsia (increased thirst).  Patient denies neuropathy (nerve pain). Patient denies visual changes. Patient reports self foot exams.   Freestyle Libre 2.0 patient education Patient taking >500 mg Vitamin C: denies Reminded patient to not take Vitamin C with Freestyle Libre.  Sensor application -- sensor placed on  right arm 1. Site selection and site prep with alcohol pad/Skin Tac 2. Sensor prep-sensor pack and sensor applicator 3. Starting the sensor: 1 hour warm up before BG readings available     Will ask for fingersticks the first 12 hours   4. Sensor change every 14 days and rotate site 5. Call [employer] customer service if sensor comes off before 14 days  Safety and Troubleshooting 1. Scan the sensor at least every 8 hours 2. When the "test BG" symbol appears, test fingerstick blood glucose prior to    making treatment decisions 3. Do a fingerstick blood glucose test if the sensor readings do not match how    you feel 4. Remove sensor prior for MRI or CT. Sensor may be damaged by exposure to    airport x-ray screening 5. Vitamin C may cause false high readings and aspirin may cause false low     readings 6. Store sensor kit between 39 and 77 degrees. Can be refrigerated at this temp.  Contact information provided for 07-09-1990 customer service and/or trainer.  Objective:   CGM report from last 14 days      Labs:   Physical Exam Neurological:     Mental Status: She is alert and oriented to person, place, and time.   Review of Systems  Gastrointestinal:  Negative for nausea and vomiting.   Lab Results  Component Value Date   HGBA1C 7.7 (A) 12/03/2020   HGBA1C 8.3 (A) 08/30/2020   HGBA1C 7.8 (A) 06/04/2020    Lipid Panel     Component Value Date/Time   CHOL 113 01/31/2020 0916   TRIG 93 01/31/2020 0916  HDL 46 01/31/2020 0916   CHOLHDL 2.5 01/31/2020 0916   CHOLHDL 2.4 04/21/2016 1438   VLDL 15 04/21/2016 1438   LDLCALC 49 01/31/2020 0916   LDLDIRECT 94 12/07/2007 2019    Assessment/Plan:   T2DM is not controlled based on in-office A1C today. Patient's CGM data projected A1C to be lower than result today. Slightly elevated A1C likely due to dietary choices. Patient will continue to make dietary improvement. Will add on 3 units of mealtime insulin with morning and  evening meals as that is when patient tends to have elevations in blood glucose. Following instruction patient verbalized understanding of treatment plan. Freestyle Libre 2.0 CGM placed on patient's right arm successfully.  Continued basal insulin glargine (Lantus) 45 units once daily. Provided sample of bolus insulin aspart (Fiasp) 3 units BID with breakfast and dinner Continued GLP-1 semaglutide (Ozempic) to $RemoveBefo'2mg'opmyfGKGWpd$  once weekly (Saturdays).  Continued SGLT2 farxiga $RemoveBefor'10mg'kDiMNazToKft$  daily Continue to use Libre 2.0 CGM Extensively discussed pathophysiology of diabetes, dietary effects on blood sugar control, and recommended lifestyle interventions. Counseled on s/sx of and management of hypoglycemia Next A1C anticipated next month due to requirements for surgery.   Follow-up appointment in two weeks to remove CGM. Written patient instructions provided.  This appointment required 35 minutes of direct patient care.  Thank you for involving pharmacy to assist in providing this patient's care.  Patient seen with Meyer Russel, PharmD Candidate, and Joseph Art, PharmD - PGY-1 Resident.   The patient is currently using Continuous Glucose Monitoring. The patient is injecting insulin 3 times a day and was previously testing blood glucose 1-4 times a day. The patient is making adjustments to their diabetes regimen based on glucose readings.

## 2020-12-03 NOTE — Patient Instructions (Signed)
[name] it was a pleasure seeing you today.   Please do the following:  Start taking Fiasp 3 units with breakfast and dinner as needed as directed today during your appointment. If you have any questions or if you believe something has occurred because of this change, call me or your doctor to let one of [place] know.  Continue checking blood sugars at home. It's really important that you record these and bring these in to your next doctor's appointment.  Continue making the lifestyle changes we've discussed together during our visit. Diet and exercise play a significant role in improving your blood sugars.  Follow-up with me in two weeks.    Hypoglycemia or low blood sugar:   Low blood sugar can happen quickly and may become an emergency if not treated right away.   While this shouldn't happen often, it can be brought upon if you skip a meal or do not eat enough. Also, if your insulin or other diabetes medications are dosed too high, this can cause your blood sugar to go to low.   Warning signs of low blood sugar include: Feeling shaky or dizzy Feeling weak or tired  Excessive hunger Feeling anxious or upset  Sweating even when you aren't exercising  What to do if I experience low blood sugar? Follow the Rule of 15 Check your blood sugar with your meter. If lower than 70, proceed to step 2.  Treat with 15 grams of fast acting carbs which is found in 3-4 glucose tablets. If none are available you can try hard candy, 1 tablespoon of sugar or honey,4 ounces of fruit juice, or 6 ounces of REGULAR soda.  Re-check your sugar in 15 minutes. If it is still below 70, do what you did in step 2 again. If your blood sugar has come back up, go ahead and eat a snack or small meal made up of complex carbs (ex. Whole grains) and protein at this time to avoid recurrence of low blood sugar.

## 2020-12-05 ENCOUNTER — Telehealth: Payer: Self-pay | Admitting: Pharmacist

## 2020-12-05 MED ORDER — PEN NEEDLES 32G X 4 MM MISC: 0 refills | Status: DC

## 2020-12-05 NOTE — Telephone Encounter (Signed)
Patient called to let me know she had overnight hypoglycemic events the last two nights since adding on 3 units of Fiasp with her meals.   Instructed patient to decrease Fiasp to 2 units with meals and to skip breakfast and dinner dose if blood glucose <130 before she eats. Patient sometimes does not eat a large meal so instructed her to only take if she is going to consume a full meal and avoid if she is only going to "snack".  Will follow-up with patient via telephone next week.

## 2020-12-06 NOTE — Telephone Encounter (Signed)
I reviewed Dr Michel Harrow assessment and plan.  I am in agreement.

## 2020-12-12 NOTE — Progress Notes (Signed)
Remote ICD transmission.   

## 2020-12-14 DIAGNOSIS — I509 Heart failure, unspecified: Secondary | ICD-10-CM | POA: Diagnosis not present

## 2020-12-14 DIAGNOSIS — R5381 Other malaise: Secondary | ICD-10-CM | POA: Diagnosis not present

## 2020-12-14 DIAGNOSIS — J449 Chronic obstructive pulmonary disease, unspecified: Secondary | ICD-10-CM | POA: Diagnosis not present

## 2020-12-14 DIAGNOSIS — J45909 Unspecified asthma, uncomplicated: Secondary | ICD-10-CM | POA: Diagnosis not present

## 2020-12-14 DIAGNOSIS — G4733 Obstructive sleep apnea (adult) (pediatric): Secondary | ICD-10-CM | POA: Diagnosis not present

## 2020-12-17 ENCOUNTER — Other Ambulatory Visit: Payer: Self-pay

## 2020-12-17 ENCOUNTER — Ambulatory Visit (INDEPENDENT_AMBULATORY_CARE_PROVIDER_SITE_OTHER): Payer: Medicare HMO

## 2020-12-17 ENCOUNTER — Ambulatory Visit (INDEPENDENT_AMBULATORY_CARE_PROVIDER_SITE_OTHER): Payer: Medicare HMO | Admitting: Pharmacist

## 2020-12-17 DIAGNOSIS — E1140 Type 2 diabetes mellitus with diabetic neuropathy, unspecified: Secondary | ICD-10-CM | POA: Diagnosis not present

## 2020-12-17 DIAGNOSIS — I5032 Chronic diastolic (congestive) heart failure: Secondary | ICD-10-CM | POA: Diagnosis not present

## 2020-12-17 DIAGNOSIS — Z794 Long term (current) use of insulin: Secondary | ICD-10-CM

## 2020-12-17 DIAGNOSIS — Z95810 Presence of automatic (implantable) cardiac defibrillator: Secondary | ICD-10-CM

## 2020-12-17 NOTE — Assessment & Plan Note (Signed)
T2DM is controlled based on Libre CGM data which is reporting A1C to be around 6.5%. Will have patient continue to hold Fiasp for time being due to episodes of mid-day hypolycemia when added to regimen at last visit. Can consider adding in future if needed to obtain furhter glycemic control. Following instruction patient verbalized understanding of treatment plan. Freestyle Libre 2.0 CGM placed on patient's left arm successfully.  1. Continued basal insulin glargine (Lantus) 45 units once daily. 2. Hold (Fiasp) 3 units BID with breakfast and dinner 3. Continued GLP-1 semaglutide (Ozempic) to 2mg  once weekly (Saturdays).  4. Continued SGLT2 farxiga 10mg  daily 5. Continue to use Libre 2.0 CGM 6. Extensively discussed pathophysiology of diabetes, dietary effects on blood sugar control, and recommended lifestyle interventions. 7. Counseled on s/sx of and management of hypoglycemia 8. Next A1C anticipated next visit due to patient meeting criteria for more frequent checks in order to obtain surgery  Follow-up appointment in two weeks to review blood glucose readings. Written patient instructions provided.

## 2020-12-17 NOTE — Patient Instructions (Signed)
[name] it was a pleasure seeing you today.   Please do the following:  Continue your medications as directed today during your appointment. If you have any questions or if you believe something has occurred because of this change, call me or your doctor to let one of [place] know.  Continue checking blood sugars at home. It's really important that you record these and bring these in to your next doctor's appointment.  Continue making the lifestyle changes we've discussed together during our visit. Diet and exercise play a significant role in improving your blood sugars.  Follow-up with me in two weeks.    Hypoglycemia or low blood sugar:   Low blood sugar can happen quickly and may become an emergency if not treated right away.   While this shouldn't happen often, it can be brought upon if you skip a meal or do not eat enough. Also, if your insulin or other diabetes medications are dosed too high, this can cause your blood sugar to go to low.   Warning signs of low blood sugar include: Feeling shaky or dizzy Feeling weak or tired  Excessive hunger Feeling anxious or upset  Sweating even when you aren't exercising  What to do if I experience low blood sugar? Follow the Rule of 15 Check your blood sugar with your meter. If lower than 70, proceed to step 2.  Treat with 15 grams of fast acting carbs which is found in 3-4 glucose tablets. If none are available you can try hard candy, 1 tablespoon of sugar or honey,4 ounces of fruit juice, or 6 ounces of REGULAR soda.  Re-check your sugar in 15 minutes. If it is still below 70, do what you did in step 2 again. If your blood sugar has come back up, go ahead and eat a snack or small meal made up of complex carbs (ex. Whole grains) and protein at this time to avoid recurrence of low blood sugar.

## 2020-12-17 NOTE — Progress Notes (Signed)
Subjective:    Patient ID: [name], female    DOB: 10-Jun-1950, 70 y.o.   MRN: [id number]  HPI Patient is a 70 y.o. female who presents for diabetes management and Libre 2.0 review. She is in good spirits and presents without assistance. Patient was last seen by Primary Care Provider on 09/19/20. Last seen in pharmacy clinic 12/03/20.  Patient needs to have an A1C of 7.5% or less to receive her knee replacement surgery.   Insurance coverage/medication affordability: Medicare  Family/Social history: patient currently unable to drive so her neighbor helps bring her to appointments  Current diabetes medications include: farxiga 10mg , insulin glargine (Basaglar) 45 units once daily, Ozempic (semaglutide) 2 mg once weekly on Saturdays, Fiasp 3 units with meals (not currently taking due to hypoglycemic episodes) Patient states that She is taking her medications as prescribed. Patient reports adherence with medications.   Do you feel that your medications are working for you?  yes  Have you been experiencing any side effects to the medications prescribed? no  Do you have any problems obtaining medications due to transportation or finances?  no    Patient reported dietary habits: Patient reports that she does not eat on a consistent schedule and sometimes may eat less than three meals a day. - Breakfast: Currently eating oatmeal with a small amount of sugar and cinnamon - Lunch: tuna sandwich - Dinner: air fried chicken with brown rice and broccoli - Snacks: animal crackers, low sugar canned fruits   Patient reports hypoglycemic events since adding Fiasp Patient denies polyuria (increased urination).  Patient denies polyphagia (increased appetite).  Patient denies polydipsia (increased thirst).  Patient denies neuropathy (nerve pain). Patient denies visual changes. Patient reports self foot exams.   Freestyle Libre 2.0 patient education Patient taking >500 mg Vitamin C:  denies Reminded patient to not take Vitamin C with Freestyle Libre.  Sensor application -- sensor placed on left arm 1. Site selection and site prep with alcohol pad/Skin Tac 2. Sensor prep-sensor pack and sensor applicator 3. Starting the sensor: 1 hour warm up before BG readings available     Will ask for fingersticks the first 12 hours   4. Sensor change every 14 days and rotate site 5. Call [employer] customer service if sensor comes off before 14 days  Safety and Troubleshooting 1. Scan the sensor at least every 8 hours 2. When the "test BG" symbol appears, test fingerstick blood glucose prior to    making treatment decisions 3. Do a fingerstick blood glucose test if the sensor readings do not match how    you feel 4. Remove sensor prior for MRI or CT. Sensor may be damaged by exposure to    airport x-ray screening 5. Vitamin C may cause false high readings and aspirin may cause false low     readings 6. Store sensor kit between 39 and 77 degrees. Can be refrigerated at this temp.  Contact information provided for 07-09-1990 customer service and/or trainer.  Objective:   CGM report from last 14 days     Labs:   Physical Exam Neurological:     Mental Status: She is alert and oriented to person, place, and time.   Review of Systems  Gastrointestinal:  Negative for nausea and vomiting.   Lab Results  Component Value Date   HGBA1C 7.7 (A) 12/03/2020   HGBA1C 8.3 (A) 08/30/2020   HGBA1C 7.8 (A) 06/04/2020    Lipid Panel     Component Value  Date/Time   CHOL 113 01/31/2020 0916   TRIG 93 01/31/2020 0916   HDL 46 01/31/2020 0916   CHOLHDL 2.5 01/31/2020 0916   CHOLHDL 2.4 04/21/2016 1438   VLDL 15 04/21/2016 1438   LDLCALC 49 01/31/2020 0916   LDLDIRECT 94 12/07/2007 2019    Assessment/Plan:   T2DM is controlled based on Libre CGM data which is reporting A1C to be around 6.5%. Will have patient continue to hold Fiasp for time being due to episodes of mid-day  hypolycemia when added to regimen at last visit. Can consider adding in future if needed to obtain furhter glycemic control. Following instruction patient verbalized understanding of treatment plan. Freestyle Libre 2.0 CGM placed on patient's left arm successfully.  Continued basal insulin glargine (Lantus) 45 units once daily. Hold (Fiasp) 3 units BID with breakfast and dinner Continued GLP-1 semaglutide (Ozempic) to $RemoveBefo'2mg'BOOFsLxDyLM$  once weekly (Saturdays).  Continued SGLT2 farxiga $RemoveBefor'10mg'qARcwNfSgWeT$  daily Continue to use Libre 2.0 CGM Extensively discussed pathophysiology of diabetes, dietary effects on blood sugar control, and recommended lifestyle interventions. Counseled on s/sx of and management of hypoglycemia Next A1C anticipated next visit due to patient meeting criteria for more frequent checks in order to obtain surgery  Follow-up appointment in two weeks to review blood glucose readings. Written patient instructions provided.  This appointment required 35 minutes of direct patient care.  Thank you for involving pharmacy to assist in providing this patient's care.  Patient seen with Meyer Russel, PharmD Candidate  The patient is currently using Continuous Glucose Monitoring. The patient is injecting insulin 1-3 times a day and was previously testing blood glucose 1-4 times a day. The patient is making adjustments to their diabetes regimen based on glucose readings.

## 2020-12-18 ENCOUNTER — Other Ambulatory Visit: Payer: Self-pay | Admitting: Family Medicine

## 2020-12-18 NOTE — Progress Notes (Signed)
EPIC Encounter for ICM Monitoring  Patient Name: [name] is a 70 y.o. female Date: 12/18/2020 Primary Care Physican: Maury Dus, MD Primary Cardiologist: Croitoru Electrophysiologist: Croitoru 12/18/2020 Weight: 188 lbs    Since 20-Nov-2020 AT/AF 31 Time in AT/AF 0.2 hr/day (0.9%) Longest AT/AF 70 minutes                                       Spoke with patient and heart failure questions reviewed.  Pt asymptomatic for fluid accumulation and feeling well.    Optivol thoracic impedance suggesting normal fluid levels.   Prescribed dosage:  Torsemide 20 mg 1 tablet daily.   Potassium 20 mEq 1 tablet by mouth as directed.   Labs: 09/19/2020 Creatinine 1.31, BUN 23, Potassium 4.4, Sodium 142, GFR 44 08/30/2020 Creatinine 1.32, BUN 18, Potassium 4.1, Sodium 140, GFR 44 07/23/2020 Creatinine 1.11, BUN 17, Potassium 4.1, Sodium 139, GFR 54 07/11/2020 Creatinine 1.31, BUN 16, Potassium 4.8, Sodium 139, GFR 44 06/04/2020 Creatinine 1.28, BUN 21, Potassium 4.3, Sodium 141  A complete set of results can be found in Results Review.   Recommendations:  Recommendation to limit fluid intake to 64 oz daily.  Encouraged to call if experiencing any fluid symptoms.   Follow-up plan: ICM clinic phone appointment on 01/20/2021.   91 day device clinic remote transmission 02/19/2021.     Next office visit scheduled:  04/21/2021 for Dr Royann Shivers.     Copy of ICM check sent to Dr. Royann Shivers.   3 month ICM trend: 12/17/2020.    1 Year ICM trend:       [name], RN 12/18/2020 3:49 PM

## 2020-12-19 ENCOUNTER — Telehealth: Payer: Self-pay | Admitting: Pharmacist

## 2020-12-19 ENCOUNTER — Other Ambulatory Visit: Payer: Self-pay | Admitting: Family Medicine

## 2020-12-19 MED ORDER — OZEMPIC (1 MG/DOSE) 4 MG/3ML ~~LOC~~ SOPN: 2.0000 mg | PEN_INJECTOR | SUBCUTANEOUS | 2 refills | Status: DC

## 2020-12-19 NOTE — Telephone Encounter (Signed)
Patient called to let me know that her pharmacy does not have any Ozempic in stock but the [facility] on [address] has the 1mg  pens in stock. Sent in prescription for 1mg  pen with instructions to take two injections to make 2mg  dose.   Patient to call me back if any issues.

## 2020-12-23 NOTE — Telephone Encounter (Signed)
Pharmacy not able to fill prescription for Ozempic 1mg  inject twice due to insurance.   Medication Samples in the sample room fridge to be provided to patient.  Drug name: Ozempic       Strength: 2mg         Qty: 2 pens  LOT:  Exp.Date: 10/10/21  The patient has been instructed regarding the correct time, dose, and frequency of taking this medication, including desired effects and most common side effects.   [id number] 2:50 PM 12/23/2020

## 2020-12-23 NOTE — Addendum Note (Signed)
Addended by: Cheral Almas on: 12/23/2020 02:52 PM   Modules accepted: Orders

## 2020-12-26 ENCOUNTER — Other Ambulatory Visit: Payer: Self-pay

## 2020-12-26 ENCOUNTER — Ambulatory Visit: Payer: Medicare HMO | Admitting: Sports Medicine

## 2020-12-26 ENCOUNTER — Encounter: Payer: Self-pay | Admitting: Sports Medicine

## 2020-12-26 DIAGNOSIS — M79609 Pain in unspecified limb: Secondary | ICD-10-CM

## 2020-12-26 DIAGNOSIS — B351 Tinea unguium: Secondary | ICD-10-CM

## 2020-12-26 DIAGNOSIS — E1140 Type 2 diabetes mellitus with diabetic neuropathy, unspecified: Secondary | ICD-10-CM

## 2020-12-26 DIAGNOSIS — Z794 Long term (current) use of insulin: Secondary | ICD-10-CM

## 2020-12-26 DIAGNOSIS — L84 Corns and callosities: Secondary | ICD-10-CM

## 2020-12-26 NOTE — Progress Notes (Signed)
Subjective: [name] is a 70 y.o. female patient with history of diabetes who presents to office today complaining of long,mildly painful nails and callus while ambulating in shoes; unable to trim.   Patient on Eliquis.  FBS better A1c 7.7 also taking Eliquis  Patient Active Problem List   Diagnosis Date Noted   Screen for colon cancer 09/19/2020   Left ear pain 06/25/2020   Chronic renal impairment, stage 3a (HCC) 06/04/2020   Pain in both lower extremities 05/01/2020   Iliotibial band syndrome, left 11/01/2018   GERD (gastroesophageal reflux disease) 03/23/2018   Chronic diastolic heart failure (HCC) 01/07/2018   Mild obesity 01/07/2018   Arthritis of carpometacarpal (CMC) joint of thumb 03/26/2016   Primary localized osteoarthritis of right knee    Visit for monitoring Tikosyn therapy 08/21/2015   Long term current use of anticoagulant therapy 08/21/2015   Ventricular tachycardia (HCC) 05/22/2015   Obesity (BMI 30-39.9) 01/04/2014   ICD (St. Jude Protecta dual-chamber),secondary prevention (VF arrest) January 2012 11/19/2012   Asthma, moderate persistent 12/10/2011   Diabetic neuropathy (HCC) 09/18/2011   Osteoarthritis of right knee 08/28/2008   Type 2 diabetes mellitus with diabetic neuropathy, with long-term current use of insulin (HCC) 06/10/2006   HYPERCHOLESTEROLEMIA 06/10/2006   Essential hypertension 06/10/2006   PAF (paroxysmal atrial fibrillation) (HCC) 06/10/2006   OSA on CPAP 06/10/2006   Current Outpatient Medications on File Prior to Visit  Medication Sig Dispense Refill   OZEMPIC, 2 MG/DOSE, 8 MG/3ML SOPN INJECT 2MG  UNDER THE SKIN WEEKLY 3 mL 2   acetaminophen (TYLENOL 8 HOUR) 650 MG CR tablet Take 1 tablet (650 mg total) by mouth every 8 (eight) hours as needed for pain. 90 tablet 2   bisoprolol (ZEBETA) 10 MG tablet Take 1 tablet (10 mg total) by mouth in the morning and at bedtime. 180 tablet 1   budesonide-formoterol (SYMBICORT) 80-4.5 MCG/ACT  inhaler INHALE 2 PUFFS INTO THE LUNGS IN THE MORNING AND AT BEDTIME 10.2 g 11   buPROPion (WELLBUTRIN XL) 150 MG 24 hr tablet Take 1 tablet (150 mg total) by mouth daily. 90 tablet 3   Continuous Blood Gluc Receiver (FREESTYLE LIBRE 2 READER) DEVI Use as directed 1 each 0   Continuous Blood Gluc Sensor (FREESTYLE LIBRE 2 SENSOR) MISC Use as directed. Replace every 14 days     diclofenac Sodium (VOLTAREN) 1 % GEL Apply 2 g topically 4 (four) times daily. 100 g 1   dofetilide (TIKOSYN) 250 MCG capsule TAKE 1 CAPSULE(250 MCG) BY MOUTH TWICE DAILY 180 capsule 3   ELIQUIS 5 MG TABS tablet TAKE 1 TABLET(5 MG) BY MOUTH TWICE DAILY 180 tablet 1   famotidine (PEPCID) 20 MG tablet Take 1 tablet (20 mg total) by mouth daily. 90 tablet 3   FARXIGA 10 MG TABS tablet TAKE 1 TABLET(10 MG) BY MOUTH DAILY BEFORE AND BREAKFAST 90 tablet 1   fluticasone (FLONASE) 50 MCG/ACT nasal spray instill 1 spray into each nostril twice a day 16 g 5   glucose blood test strip Use as instructed 100 each 12   Insulin Glargine (BASAGLAR KWIKPEN) 100 UNIT/ML Inject 50 Units into the skin daily. 15 mL 2   Insulin Pen Needle (PEN NEEDLES) 32G X 4 MM MISC Use 3x/day to administer insulin 100 each 0   levalbuterol (XOPENEX HFA) 45 MCG/ACT inhaler INHALE 1 TO 2 PUFFS BY MOUTH INTO THE LUNGS EVERY 4 HOURS IF NEEDED FOR WHEEZING 1 Inhaler 1   levalbuterol (XOPENEX) 0.63 MG/3ML nebulizer  solution One vial in nebulizer four times daily as needed 360 mL 5   montelukast (SINGULAIR) 10 MG tablet TAKE 1 TABLET BY MOUTH AT BEDTIME 90 tablet 1   polyethylene glycol powder (GLYCOLAX/MIRALAX) 17 GM/SCOOP powder take 17GM (DISSOLVED IN WATER) by mouth once daily THREE TIMES A WEEK if needed as directed 500 g 3   potassium chloride SA (KLOR-CON) 20 MEQ tablet Take 1 tablet (20 mEq total) by mouth daily as directed. (Patient taking differently: Take 1 tablet (20 mEq total) by mouth daily) 30 tablet 11   PRODIGY LANCETS 28G MISC 1 Units by Does not  apply route 4 (four) times daily. 100 each 4   rosuvastatin (CRESTOR) 10 MG tablet TAKE 1 TABLET BY MOUTH DAILY 90 tablet 3   torsemide (DEMADEX) 20 MG tablet Take 1 tablet (20 mg total) by mouth daily. 180 tablet 1   No current facility-administered medications on file prior to visit.   Allergies  Allergen Reactions   Avelox [Moxifloxacin Hcl In Nacl] Other (See Comments)    Cardiac arrest per pt   Nsaids Other (See Comments)    Cardiac arrest per pt    Simvastatin Other (See Comments)   Ace Inhibitors Other (See Comments) and Cough   Jardiance [Empagliflozin] Other (See Comments)    Felt "crazy", fatigue, sweating, denies hypoglycemia while taking   Latex Rash    Recent Results (from the past 2160 hour(s))  Cologuard     Status: None   Collection Time: 10/28/20 12:00 AM  Result Value Ref Range   Cologuard Negative Negative  CUP PACEART REMOTE DEVICE CHECK     Status: None   Collection Time: 11/20/20  4:36 AM  Result Value Ref Range   Date Time Interrogation Session [id number]    Pulse Generator Manufacturer [employer]    Pulse Gen Model [id number] Evera MRI XT DR    Pulse Gen Serial Number [id number]    Clinic Name [facility]    Implantable Pulse Generator Type Implantable Cardiac Defibulator    Implantable Pulse Generator Implant Date [id number]    Implantable Lead Manufacturer [place]    Implantable Lead Model 5076 CapSureFix Novus    Implantable Lead Serial Number [id number]    Implantable Lead Implant Date 40102725    Implantable Lead Location [id number]    Implantable Lead Manufacturer [facility]    Implantable Lead Model 6935 Sprint Quattro Secure S    Implantable Lead Serial Number [id number]    Implantable Lead Implant Date 36644034    Implantable Lead Location [id number]    Lead Channel Setting Sensing Sensitivity 0.3 mV   Lead Channel Setting Pacing Amplitude 2 V   Lead Channel Setting Pacing Pulse Width 0.4 ms   Lead Channel Setting Pacing Amplitude 2.5 V   Lead Channel  Impedance Value 437 ohm   Lead Channel Sensing Intrinsic Amplitude 1.75 mV   Lead Channel Sensing Intrinsic Amplitude 1.75 mV   Lead Channel Pacing Threshold Amplitude 0.5 V   Lead Channel Pacing Threshold Pulse Width 0.4 ms   Lead Channel Impedance Value 437 ohm   Lead Channel Impedance Value 380 ohm   Lead Channel Sensing Intrinsic Amplitude 13 mV   Lead Channel Sensing Intrinsic Amplitude 13 mV   Lead Channel Pacing Threshold Amplitude 0.75 V   Lead Channel Pacing Threshold Pulse Width 0.4 ms   HighPow Impedance 83 ohm   Battery Status OK    Battery Remaining Longevity 64 mo   Battery Voltage 2.98 V   [place]  RA Percent Paced 95.38 %   Brady Statistic RV Percent Paced 0.32 %   Brady Statistic AP VP Percent 0.27 %   Brady Statistic AS VP Percent 0.01 %   Brady Statistic AP VS Percent 96.58 %   Brady Statistic AS VS Percent 3.14 %   Eval Rhythm SR at 80 bpm   HgB A1c     Status: Abnormal   Collection Time: 12/03/20  9:09 AM  Result Value Ref Range   Hemoglobin A1C     HbA1c POC (<> result, manual entry)     HbA1c, POC (prediabetic range)     HbA1c, POC (controlled diabetic range) 7.7 (A) 0.0 - 7.0 %    Objective: General: Patient is awake, alert, and oriented x 3 and in no acute distress.  Integument: Skin is warm, dry and supple bilateral. Nails are tender, long, thickened and dystrophic with subungual debris, consistent with onychomycosis, 1-5 bilateral. No signs of infection. + callus hallux, sub 3-4, and 5th toes bilateral. Remaining integument unremarkable.  Vasculature:  Dorsalis Pedis pulse 1/4 bilateral. Posterior Tibial pulse  1/4 bilateral. Capillary fill time <3 sec 1-5 bilateral. Scant hair growth to the level of the digits.Temperature gradient within normal limits. No varicosities present bilateral. No edema present bilateral.   Neurology: The patient has slighty diminished sensation measured with a 5.07/10g Semmes Weinstein Monofilament at all pedal sites  bilateral . Vibratory sensation diminished bilateral with tuning fork. No Babinski sign present bilateral.   Musculoskeletal:  Asymptomatic pes planus, bunion and hammertoe pedal deformities noted bilateral. Muscular strength 5/5 in all lower extremity muscular groups bilateral without pain on range of motion. No tenderness with calf compression bilateral.  Assessment and Plan: Problem List Items Addressed This Visit       Endocrine   Type 2 diabetes mellitus with diabetic neuropathy, with long-term current use of insulin (HCC)   Other Visit Diagnoses     Pain due to onychomycosis of nail    -  Primary   Corns and callosities          -Examined patient. -Re-Educated patient on diabetic foot care, especially with regards to the vascular, neurological and musculoskeletal systems.  -Mechanically debrided callus >5 using sterile chisel blade and debrided all nails 1-5 bilateral using sterile nail nipper and filed with dremel without incident and no additional charge like previous -Continue with daily skin emollients for callus areas like previous  -Recommend good supportive shoes daily for foot type -Patient to return  in 2.5 months for at risk foot care -Patient advised to call the office if any problems or questions arise in the meantime.  Asencion Islam, DPM

## 2020-12-27 ENCOUNTER — Telehealth: Payer: Self-pay

## 2020-12-27 DIAGNOSIS — U071 COVID-19: Secondary | ICD-10-CM

## 2020-12-27 MED ORDER — MOLNUPIRAVIR EUA 200MG CAPSULE: 4.0000 | ORAL_CAPSULE | Freq: Two times a day (BID) | ORAL | 0 refills | Status: AC

## 2020-12-27 MED ORDER — BENZONATATE 200 MG PO CAPS: 200.0000 mg | ORAL_CAPSULE | Freq: Two times a day (BID) | ORAL | 0 refills | Status: DC | PRN

## 2020-12-27 NOTE — Telephone Encounter (Signed)
Returned patient's call. Her symptoms began within the past day.  She is not a candidate for Paxlovid due to several medication interactions (dofetilide, eliquis, etc). Rx sent for molnupiravir (800mg  BID x5 days). Also sent Tessalon perles as patient reports her cough is particularly bothersome.  Counseled on ED precautions if she were to develop shortness of breath, difficulty staying hydrated, etc. Discussed quarantine requirements.  Patient voiced understanding. Fortunately she is vaccinated and boosted x1.

## 2020-12-27 NOTE — Telephone Encounter (Signed)
Patient calls nurse line regarding testing positive for COVID. Patient reports testing positive yesterday with home test. Symptom onset yesterday. Patient reports cough, headache, fatigue, decreased appetite.   Please advise if patient would be a good candidate for oral antiviral therapy.   Veronda Prude, RN

## 2020-12-27 NOTE — Telephone Encounter (Signed)
Received fax from pharmacy regarding Ozempic rx. Called pharmacy. Pharmacist reports that Ozempic is on manufacturer back order and they are unsure when they will have shipments.   Please advise how patient should proceed.   Veronda Prude, RN

## 2020-12-27 NOTE — Telephone Encounter (Signed)
Returned patient call as requested per voice mail.  Pt reports her daughter that lives with her has COVID.  She said last night she tested positive for COVID even though her daughter had been isolating in her room.  She is unsure what today and is having symptoms.  Advised to call her PCP after we hang up and if unable to reach the primary to use ER or Urgent care if needed.  She stated she has been working with a pharmacist regarding her diabetes and may contact her for further advice and assistance.  She appreciated the call back.

## 2020-12-27 NOTE — Telephone Encounter (Signed)
Patient returns call to nurse line x 2. Provided patient with supportive measures.   Patient is requesting medication be sent into pharmacy.   Please advise.   Veronda Prude, RN

## 2021-01-02 ENCOUNTER — Ambulatory Visit (INDEPENDENT_AMBULATORY_CARE_PROVIDER_SITE_OTHER): Payer: Medicare HMO | Admitting: Pharmacist

## 2021-01-02 ENCOUNTER — Other Ambulatory Visit: Payer: Self-pay

## 2021-01-02 DIAGNOSIS — Z794 Long term (current) use of insulin: Secondary | ICD-10-CM | POA: Diagnosis not present

## 2021-01-02 DIAGNOSIS — E1140 Type 2 diabetes mellitus with diabetic neuropathy, unspecified: Secondary | ICD-10-CM | POA: Diagnosis not present

## 2021-01-02 DIAGNOSIS — Z96659 Presence of unspecified artificial knee joint: Secondary | ICD-10-CM | POA: Diagnosis not present

## 2021-01-02 LAB — POCT GLYCOSYLATED HEMOGLOBIN (HGB A1C): HbA1c, POC (controlled diabetic range): 7.7 % — AB (ref 0.0–7.0)

## 2021-01-02 MED ORDER — TRESIBA FLEXTOUCH 200 UNIT/ML ~~LOC~~ SOPN: 44.0000 [IU] | PEN_INJECTOR | Freq: Every day | SUBCUTANEOUS | 0 refills | Status: DC

## 2021-01-02 MED ORDER — ACARBOSE 25 MG PO TABS: 25.0000 mg | ORAL_TABLET | Freq: Three times a day (TID) | ORAL | 0 refills | Status: DC

## 2021-01-02 NOTE — Progress Notes (Addendum)
Subjective:    Patient ID: [name], female    DOB: Aug 30, 1950, 70 y.o.   MRN: [id number]  HPI Patient is a 70 y.o. female who presents for diabetes management and Libre 2.0 review. She is in good spirits and presents without assistance. Patient was last seen by Primary Care Provider on 09/19/20. Last seen in pharmacy clinic 12/17/20.  Patient needs to have an A1C of 7.5% or less to receive her knee replacement surgery.   Insurance coverage/medication affordability: Medicare  Family/Social history: patient currently unable to drive so her neighbor helps bring her to appointments  Current diabetes medications include: farxiga 15m, insulin glargine (Basaglar) 45 units once daily, Ozempic (semaglutide) 2 mg once weekly on Saturdays, Fiasp 3 units with meals (not currently taking due to hypoglycemic episodes when administering) Patient states that She is taking her medications as prescribed. Patient reports adherence with medications.   Do you feel that your medications are working for you?  yes  Have you been experiencing any side effects to the medications prescribed? no  Do you have any problems obtaining medications due to transportation or finances?  no    Patient reported dietary habits: Patient reports that she does not eat on a consistent schedule and sometimes may eat less than three meals a day. - Breakfast: Currently eating oatmeal with a small amount of sugar and cinnamon - Lunch: tuna sandwich - Dinner: air fried chicken with brown rice and broccoli - Snacks: animal crackers, low sugar canned fruits   Patient denies hypoglycemic events Patient denies polyuria (increased urination).  Patient denies polyphagia (increased appetite).  Patient denies polydipsia (increased thirst).  Patient denies neuropathy (nerve pain). Patient denies visual changes. Patient reports self foot exams.   Freestyle Libre 2.0 patient education Patient taking >500 mg Vitamin C:  denies Reminded patient to not take Vitamin C with Freestyle Libre.  Sensor application -- sensor placed on right arm 1. Site selection and site prep with alcohol pad/Skin Tac 2. Sensor prep-sensor pack and sensor applicator 3. Starting the sensor: 1 hour warm up before BG readings available     Will ask for fingersticks the first 12 hours   4. Sensor change every 14 days and rotate site 5. Call [employer] customer service if sensor comes off before 14 days  Safety and Troubleshooting 1. Scan the sensor at least every 8 hours 2. When the "test BG" symbol appears, test fingerstick blood glucose prior to    making treatment decisions 3. Do a fingerstick blood glucose test if the sensor readings do not match how    you feel 4. Remove sensor prior for MRI or CT. Sensor may be damaged by exposure to    airport x-ray screening 5. Vitamin C may cause false high readings and aspirin may cause false low     readings 6. Store sensor kit between 39 and 77 degrees. Can be refrigerated at this temp.  Contact information provided for [employer] service and/or trainer.  Objective:   CGM report from last 14 days     Labs:   Physical Exam Neurological:     Mental Status: She is alert and oriented to person, place, and time.   Review of Systems  Gastrointestinal:  Negative for nausea and vomiting.   Lab Results  Component Value Date   HGBA1C 7.7 (A) 01/02/2021   HGBA1C 7.7 (A) 12/03/2020   HGBA1C 8.3 (A) 08/30/2020    Lipid Panel     Component Value Date/Time  CHOL 113 01/31/2020 0916   TRIG 93 01/31/2020 0916   HDL 46 01/31/2020 0916   CHOLHDL 2.5 01/31/2020 0916   CHOLHDL 2.4 04/21/2016 1438   VLDL 15 04/21/2016 1438   LDLCALC 49 01/31/2020 0916   LDLDIRECT 94 12/07/2007 2019    Assessment/Plan:   T2DM is controlled based on Libre CGM data which is reporting A1C to be around 6.5% but A1C today demonstrated no change. Will continue to hold Fiasp and add on Acarbose to  cover slight post-prandial elevations in blood glucose. Will also switch Lantus to [place] due to slightly longer duration of action. Patient educated on purpose, proper use and potential adverse effects of Acarbose. Following instruction patient verbalized understanding of treatment plan.   Following instruction patient verbalized understanding of treatment plan. Freestyle Libre 2.0 CGM placed on patient's left arm successfully.  Switched Lantus to Tresiba 200 units/mL 44 units once daily. Hold (Fiasp) 3 units BID with breakfast and dinner Continued GLP-1 semaglutide (Ozempic) to 23m once weekly (Saturdays).  Continued SGLT2 farxiga 162mdaily Started acarbose 2543mhree times a day with meals Continue to use Libre 2.0 CGM Extensively discussed pathophysiology of diabetes, dietary effects on blood sugar control, and recommended lifestyle interventions. Counseled on s/sx of and management of hypoglycemia Next A1C anticipated in one month  Follow-up appointment in two weeks to review blood glucose readings. Written patient instructions provided.  This appointment required 35 minutes of direct patient care.  Thank you for involving pharmacy to assist in providing this patient's care.  MelMeyer RusselharmD Candidate, and PamJuluis PitchharmD - PGY-1 Resident.   The patient is currently using Continuous Glucose Monitoring. The patient is injecting insulin 1-3 times a day and was previously testing blood glucose 1-4 times a day. The patient is making adjustments to their diabetes regimen based on glucose readings.   Medication Samples have been provided to the patient.  Drug name: TreTyler Aas    Strength: 200 units/mL        Qty: 3 pens  LOT: [id number].Date: 10/10/21  The patient has been instructed regarding the correct time, dose, and frequency of taking this medication, including desired effects and most common side effects.   RacHughes Better:20 AM 01/02/2021

## 2021-01-02 NOTE — Addendum Note (Signed)
Addended by: Cheral Almas on: 01/02/2021 11:27 AM   Modules accepted: Orders

## 2021-01-02 NOTE — Patient Instructions (Signed)
Ms. Politano it was a pleasure seeing you today.   Please do the following:  Start Tresiba in place of your other insulin and take 44 units as directed today during your appointment. If you have any questions or if you believe something has occurred because of this change, call me or your doctor to let one of [place] know.  We are also starting a new medication called acarbose. You will take this with your meals. Continue checking blood sugars at home. It's really important that you record these and bring these in to your next doctor's appointment.  Continue making the lifestyle changes we've discussed together during our visit. Diet and exercise play a significant role in improving your blood sugars.  Follow-up with me in two weeks.    Hypoglycemia or low blood sugar:   Low blood sugar can happen quickly and may become an emergency if not treated right away.   While this shouldn't happen often, it can be brought upon if you skip a meal or do not eat enough. Also, if your insulin or other diabetes medications are dosed too high, this can cause your blood sugar to go to low.   Warning signs of low blood sugar include: Feeling shaky or dizzy Feeling weak or tired  Excessive hunger Feeling anxious or upset  Sweating even when you aren't exercising  What to do if I experience low blood sugar? Follow the Rule of 15 Check your blood sugar with your meter. If lower than 70, proceed to step 2.  Treat with 15 grams of fast acting carbs which is found in 3-4 glucose tablets. If none are available you can try hard candy, 1 tablespoon of sugar or honey,4 ounces of fruit juice, or 6 ounces of REGULAR soda.  Re-check your sugar in 15 minutes. If it is still below 70, do what you did in step 2 again. If your blood sugar has come back up, go ahead and eat a snack or small meal made up of complex carbs (ex. Whole grains) and protein at this time to avoid recurrence of low blood sugar.

## 2021-01-02 NOTE — Assessment & Plan Note (Signed)
T2DM is controlled based on Libre CGM data which is reporting A1C to be around 6.5% but A1C today demonstrated no change. Will continue to hold Fiasp and add on Acarbose to cover slight post-prandial elevations in blood glucose. Patient educated on purpose, proper use and potential adverse effects of Acarbose. Following instruction patient verbalized understanding of treatment plan.   Following instruction patient verbalized understanding of treatment plan. Freestyle Libre 2.0 CGM placed on patient's left arm successfully.  1. Continued basal insulin glargine (Lantus) 45 units once daily. 2. Hold (Fiasp) 3 units BID with breakfast and dinner 3. Continued GLP-1 semaglutide (Ozempic) to 2mg  once weekly (Saturdays).  4. Continued SGLT2 farxiga 10mg  daily 5. Started acarbose 25mg  three times a day with meals 6. Continue to use Libre 2.0 CGM 7. Extensively discussed pathophysiology of diabetes, dietary effects on blood sugar control, and recommended lifestyle interventions. 8. Counseled on s/sx of and management of hypoglycemia 9. Next A1C anticipated in one month  Follow-up appointment in two weeks to review blood glucose readings. Written patient instructions provided.

## 2021-01-05 ENCOUNTER — Other Ambulatory Visit: Payer: Self-pay | Admitting: Pulmonary Disease

## 2021-01-07 ENCOUNTER — Telehealth: Payer: Self-pay | Admitting: Cardiovascular Disease

## 2021-01-07 ENCOUNTER — Telehealth: Payer: Self-pay | Admitting: *Deleted

## 2021-01-07 MED ORDER — APIXABAN 5 MG PO TABS: ORAL_TABLET | ORAL | 1 refills | Status: DC

## 2021-01-07 NOTE — Telephone Encounter (Signed)
Prescription refill request for Eliquis received. Indication:afib Last office visit:croitoru 10/22/20 Scr:1.31 09/19/20 Age: 45f Weight:88kg

## 2021-01-07 NOTE — Telephone Encounter (Signed)
Lmom pt that refill was sent

## 2021-01-07 NOTE — Telephone Encounter (Signed)
*  STAT* If patient is at the pharmacy, call can be transferred to refill team.   1. Which medications need to be refilled? (please list name of each medication and dose if known)  ELIQUIS 5 MG TABS tablet  2. Which pharmacy/location (including street and city if local pharmacy) is medication to be sent to? [id number] - [place], [place] - [address] AT [facility] OF [address]  3. Do they need a 30 day or 90 day supply? 90 with refills  Patient is scheduled to see Dr. Salena Saner 04/21/21

## 2021-01-07 NOTE — Telephone Encounter (Signed)
Refill request

## 2021-01-09 DIAGNOSIS — E1140 Type 2 diabetes mellitus with diabetic neuropathy, unspecified: Secondary | ICD-10-CM | POA: Diagnosis not present

## 2021-01-13 DIAGNOSIS — J45909 Unspecified asthma, uncomplicated: Secondary | ICD-10-CM | POA: Diagnosis not present

## 2021-01-13 DIAGNOSIS — R5381 Other malaise: Secondary | ICD-10-CM | POA: Diagnosis not present

## 2021-01-13 DIAGNOSIS — I509 Heart failure, unspecified: Secondary | ICD-10-CM | POA: Diagnosis not present

## 2021-01-13 DIAGNOSIS — J449 Chronic obstructive pulmonary disease, unspecified: Secondary | ICD-10-CM | POA: Diagnosis not present

## 2021-01-13 DIAGNOSIS — G4733 Obstructive sleep apnea (adult) (pediatric): Secondary | ICD-10-CM | POA: Diagnosis not present

## 2021-01-16 ENCOUNTER — Ambulatory Visit: Payer: Medicare HMO | Admitting: Pharmacist

## 2021-01-20 ENCOUNTER — Ambulatory Visit (INDEPENDENT_AMBULATORY_CARE_PROVIDER_SITE_OTHER): Payer: Medicare HMO

## 2021-01-20 DIAGNOSIS — I5032 Chronic diastolic (congestive) heart failure: Secondary | ICD-10-CM

## 2021-01-20 DIAGNOSIS — Z95810 Presence of automatic (implantable) cardiac defibrillator: Secondary | ICD-10-CM | POA: Diagnosis not present

## 2021-01-21 NOTE — Progress Notes (Signed)
EPIC Encounter for ICM Monitoring  Patient Name: [name] is a 70 y.o. female Date: 01/21/2021 Primary Care Physican: Maury Dus, MD Primary Cardiologist: Croitoru Electrophysiologist: Croitoru 01/21/2021 Weight: 182-183 lbs    Since 17-Dec-2020 AT/AF 45 Time in AT/AF 0.3 hr/day (1.1%) Longest AT/AF 99 minutes                                       Spoke with patient and heart failure questions reviewed.  Pt asymptomatic for fluid accumulation and feeling well.  She may be drinking more than 64 oz daily.     Optivol thoracic impedance suggesting possible fluid accumulation starting 10/5.   Prescribed dosage:  Torsemide 20 mg 1 tablet daily.   Potassium 20 mEq 1 tablet by mouth as directed.   Labs: 09/19/2020 Creatinine 1.31, BUN 23, Potassium 4.4, Sodium 142, GFR 44 08/30/2020 Creatinine 1.32, BUN 18, Potassium 4.1, Sodium 140, GFR 44 07/23/2020 Creatinine 1.11, BUN 17, Potassium 4.1, Sodium 139, GFR 54 07/11/2020 Creatinine 1.31, BUN 16, Potassium 4.8, Sodium 139, GFR 44 06/04/2020 Creatinine 1.28, BUN 21, Potassium 4.3, Sodium 141  A complete set of results can be found in Results Review.   Recommendations:  Recommendation to limit fluid intake to 64 oz daily.  Encouraged to call if experiencing any fluid symptoms.   Follow-up plan: ICM clinic phone appointment on 01/27/2021 to recheck fluid levels.   91 day device clinic remote transmission 02/19/2021.     Next office visit scheduled:  04/21/2021 for Dr Royann Shivers.     Copy of ICM check sent to Dr. Royann Shivers.    3 month ICM trend: 01/20/2021.    1 Year ICM trend:       [name], RN 01/21/2021 2:59 PM

## 2021-01-23 ENCOUNTER — Other Ambulatory Visit: Payer: Self-pay | Admitting: Family Medicine

## 2021-01-23 ENCOUNTER — Other Ambulatory Visit: Payer: Self-pay

## 2021-01-23 ENCOUNTER — Ambulatory Visit (INDEPENDENT_AMBULATORY_CARE_PROVIDER_SITE_OTHER): Payer: Medicare HMO | Admitting: Pharmacist

## 2021-01-23 DIAGNOSIS — Z794 Long term (current) use of insulin: Secondary | ICD-10-CM | POA: Diagnosis not present

## 2021-01-23 DIAGNOSIS — E1140 Type 2 diabetes mellitus with diabetic neuropathy, unspecified: Secondary | ICD-10-CM

## 2021-01-23 MED ORDER — ACARBOSE 50 MG PO TABS: 50.0000 mg | ORAL_TABLET | Freq: Three times a day (TID) | ORAL | 0 refills | Status: DC

## 2021-01-23 MED ORDER — OZEMPIC (2 MG/DOSE) 8 MG/3ML ~~LOC~~ SOPN
PEN_INJECTOR | SUBCUTANEOUS | 2 refills | Status: DC
Start: 2021-01-23 — End: 2021-02-10

## 2021-01-23 NOTE — Assessment & Plan Note (Signed)
T2DM is controlled based on Libre CGM data, will obtain A1c at next office visit. Will increase acarboseto 50 units TID with meals. Following instruction patient verbalized understanding of treatment plan. Following instruction patient verbalized understanding of treatment plan. Freestyle Libre 2.0 CGM placed on patient's left arm successfully.  1. Continue Tresiba 200 units/mL 44 units once daily. 2. Hold (Fiasp) 3 units BID with breakfast and dinner 3. Continued GLP-1 semaglutide (Ozempic) to 2mg  once weekly (Saturdays).  4. Continued SGLT2 farxiga 10mg  daily 5. Increased acarbose 50mg  three times a day with meals 6. Continue to use Libre 2.0 CGM 7. Extensively discussed pathophysiology of diabetes, dietary effects on blood sugar control, and recommended lifestyle interventions. 8. Counseled on s/sx of and management of hypoglycemia 9. Next A1C anticipated at next visit

## 2021-01-23 NOTE — Patient Instructions (Addendum)
[name] it was a pleasure seeing you today.   Please do the following:  Increase acarbose to 50mg  three times a day with your meals as directed today during your appointment. If you have any questions or if you believe something has occurred because of this change, call me or your doctor to let one of know.  Continue checking blood sugars at home. It's really important that you record these and bring these in to your next doctor's appointment.  Continue making the lifestyle changes we've discussed together during our visit. Diet and exercise play a significant role in improving your blood sugars.  Follow-up with me in two weeks.   Hypoglycemia or low blood sugar:   Low blood sugar can happen quickly and may become an emergency if not treated right away.   While this shouldn't happen often, it can be brought upon if you skip a meal or do not eat enough. Also, if your insulin or other diabetes medications are dosed too high, this can cause your blood sugar to go to low.   Warning signs of low blood sugar include: Feeling shaky or dizzy Feeling weak or tired  Excessive hunger Feeling anxious or upset  Sweating even when you aren't exercising  What to do if I experience low blood sugar? Follow the Rule of 15 Check your blood sugar with your meter. If lower than 70, proceed to step 2.  Treat with 15 grams of fast acting carbs which is found in 3-4 glucose tablets. If none are available you can try hard candy, 1 tablespoon of sugar or honey,4 ounces of fruit juice, or 6 ounces of REGULAR soda.  Re-check your sugar in 15 minutes. If it is still below 70, do what you did in step 2 again. If your blood sugar has come back up, go ahead and eat a snack or small meal made up of complex carbs (ex. Whole grains) and protein at this time to avoid recurrence of low blood sugar.

## 2021-01-23 NOTE — Progress Notes (Signed)
Subjective:    Patient ID: [name], female    DOB: 10-29-1950, 70 y.o.   MRN: [id number]  HPI Patient is a 70 y.o. female who presents for diabetes management and Libre 2.0 review. She is in good spirits and presents without assistance. Patient was last seen by Primary Care Provider on 09/19/20. Last seen in pharmacy clinic 01/02/21.  Patient needs to have an A1C of 7.5% or less to receive her knee replacement surgery.   Insurance coverage/medication affordability: Medicare  Family/Social history: patient currently unable to drive so her neighbor helps bring her to appointments  Current diabetes medications include: farxiga 10mg , insulin glargine (Basaglar) 45 units once daily, Ozempic (semaglutide) 2 mg once weekly on Saturdays, Fiasp 3 units with meals (not currently taking due to hypoglycemic episodes when administering), Acarbose 25mg  TID with meals Patient states that She is taking her medications as prescribed. Patient reports adherence with medications.   Do you feel that your medications are working for you?  yes  Have you been experiencing any side effects to the medications prescribed? no  Do you have any problems obtaining medications due to transportation or finances?  no    Patient reported dietary habits: Patient reports that she does not eat on a consistent schedule and sometimes may eat less than three meals a day. - Breakfast: Currently eating oatmeal with a small amount of sugar and cinnamon - Lunch: tuna sandwich - Dinner: air fried chicken with brown rice and broccoli - Snacks: animal crackers, low sugar canned fruits   Patient denies hypoglycemic events Patient denies polyuria (increased urination).  Patient denies polyphagia (increased appetite).  Patient denies polydipsia (increased thirst).  Patient denies neuropathy (nerve pain). Patient denies visual changes. Patient reports self foot exams.   Freestyle Libre 2.0 patient education Patient taking  >500 mg Vitamin C: denies Reminded patient to not take Vitamin C with Freestyle Libre.  Sensor application -- sensor placed on right arm 1. Site selection and site prep with alcohol pad/Skin Tac 2. Sensor prep-sensor pack and sensor applicator 3. Starting the sensor: 1 hour warm up before BG readings available     Will ask for fingersticks the first 12 hours   4. Sensor change every 14 days and rotate site 5. Call [employer] customer service if sensor comes off before 14 days  Safety and Troubleshooting 1. Scan the sensor at least every 8 hours 2. When the "test BG" symbol appears, test fingerstick blood glucose prior to    making treatment decisions 3. Do a fingerstick blood glucose test if the sensor readings do not match how    you feel 4. Remove sensor prior for MRI or CT. Sensor may be damaged by exposure to    airport x-ray screening 5. Vitamin C may cause false high readings and aspirin may cause false low     readings 6. Store sensor kit between 39 and 77 degrees. Can be refrigerated at this temp.  Contact information provided for [employer] customer service and/or trainer.  Objective:   CGM report from last 14 days     Labs:   Physical Exam Neurological:     Mental Status: She is alert and oriented to person, place, and time.   Review of Systems  Gastrointestinal:  Negative for nausea and vomiting.   Lab Results  Component Value Date   HGBA1C 7.7 (A) 01/02/2021   HGBA1C 7.7 (A) 12/03/2020   HGBA1C 8.3 (A) 08/30/2020    Lipid Panel  Component Value Date/Time   CHOL 113 01/31/2020 0916   TRIG 93 01/31/2020 0916   HDL 46 01/31/2020 0916   CHOLHDL 2.5 01/31/2020 0916   CHOLHDL 2.4 04/21/2016 1438   VLDL 15 04/21/2016 1438   LDLCALC 49 01/31/2020 0916   LDLDIRECT 94 12/07/2007 2019    Assessment/Plan:   T2DM is controlled based on Libre CGM data, will obtain A1c at next office visit. Will increase acarbose to 50 units TID with meals to help further reduce  post-prandial rise in blood glucose. Following instruction patient verbalized understanding of treatment plan. Following instruction patient verbalized understanding of treatment plan. Freestyle Libre 2.0 CGM placed on patient's left arm successfully.  Continue Tresiba 200 units/mL 44 units once daily. Hold (Fiasp) 3 units BID with breakfast and dinner Continued GLP-1 semaglutide (Ozempic) to 2mg  once weekly (Saturdays).  Continued SGLT2 farxiga 10mg  daily Increased acarbose 50mg  three times a day with meals Continue to use Libre 2.0 CGM Extensively discussed pathophysiology of diabetes, dietary effects on blood sugar control, and recommended lifestyle interventions. Counseled on s/sx of and management of hypoglycemia Next A1C anticipated at next visit  Follow-up appointment in two weeks to review blood glucose readings. Written patient instructions provided.  This appointment required 30 minutes of direct patient care.  Thank you for involving pharmacy to assist in providing this patient's care.  Patient seen with Elyse Jarvis, PharmD Candidate.  The patient is currently using Continuous Glucose Monitoring. The patient is injecting insulin 1-3 times a day and was previously testing blood glucose 1-4 times a day. The patient is making adjustments to their diabetes regimen based on glucose readings.

## 2021-01-24 NOTE — Progress Notes (Signed)
  Received: 2 days ago Croitoru, Rachelle Hora, MD  Dorwin Fitzhenry, Josephine Igo, RN Looks good, thanks

## 2021-01-27 ENCOUNTER — Ambulatory Visit (INDEPENDENT_AMBULATORY_CARE_PROVIDER_SITE_OTHER): Payer: Medicare HMO

## 2021-01-27 DIAGNOSIS — I5032 Chronic diastolic (congestive) heart failure: Secondary | ICD-10-CM

## 2021-01-27 DIAGNOSIS — Z95810 Presence of automatic (implantable) cardiac defibrillator: Secondary | ICD-10-CM

## 2021-01-28 DIAGNOSIS — J449 Chronic obstructive pulmonary disease, unspecified: Secondary | ICD-10-CM | POA: Diagnosis not present

## 2021-01-28 DIAGNOSIS — G4733 Obstructive sleep apnea (adult) (pediatric): Secondary | ICD-10-CM | POA: Diagnosis not present

## 2021-01-28 DIAGNOSIS — J45909 Unspecified asthma, uncomplicated: Secondary | ICD-10-CM | POA: Diagnosis not present

## 2021-01-28 NOTE — Progress Notes (Signed)
EPIC Encounter for ICM Monitoring  Patient Name: [name] is a 70 y.o. female Date: 01/28/2021 Primary Care Physican: Maury Dus, MD Primary Cardiologist: Croitoru Electrophysiologist: Croitoru 01/21/2021 Weight: 182-183 lbs    Clinical Status (20-Jan-2021 to 27-Jan-2021) Time in AT/AF 0.0 hr/day (0.0%)                                       Spoke with patient and heart failure questions reviewed.  Pt asymptomatic for fluid accumulation and feeling well.  She may be drinking more than 64 oz daily.     Optivol thoracic impedance suggesting fluid levels improved and trending closer to baseline   Prescribed dosage:  Torsemide 20 mg 1 tablet daily.   Potassium 20 mEq 1 tablet by mouth as directed.   Labs: 09/19/2020 Creatinine 1.31, BUN 23, Potassium 4.4, Sodium 142, GFR 44 08/30/2020 Creatinine 1.32, BUN 18, Potassium 4.1, Sodium 140, GFR 44 07/23/2020 Creatinine 1.11, BUN 17, Potassium 4.1, Sodium 139, GFR 54 07/11/2020 Creatinine 1.31, BUN 16, Potassium 4.8, Sodium 139, GFR 44 06/04/2020 Creatinine 1.28, BUN 21, Potassium 4.3, Sodium 141  A complete set of results can be found in Results Review.   Recommendations:  Recommendation to limit fluid intake to 64 oz daily.  Encouraged to call if experiencing any fluid symptoms.   Follow-up plan: ICM clinic phone appointment on 02/24/2021.   91 day device clinic remote transmission 02/19/2021.     Next office visit scheduled:  04/21/2021 for Dr Royann Shivers.     Copy of ICM check sent to Dr. Royann Shivers.     3 month ICM trend: 01/27/2021.    1 Year ICM trend:       [name], RN 01/28/2021 1:53 PM

## 2021-02-06 ENCOUNTER — Ambulatory Visit (INDEPENDENT_AMBULATORY_CARE_PROVIDER_SITE_OTHER): Payer: Medicare HMO | Admitting: Pharmacist

## 2021-02-06 ENCOUNTER — Other Ambulatory Visit: Payer: Self-pay

## 2021-02-06 DIAGNOSIS — E1140 Type 2 diabetes mellitus with diabetic neuropathy, unspecified: Secondary | ICD-10-CM

## 2021-02-06 DIAGNOSIS — Z794 Long term (current) use of insulin: Secondary | ICD-10-CM

## 2021-02-06 LAB — POCT GLYCOSYLATED HEMOGLOBIN (HGB A1C): HbA1c, POC (controlled diabetic range): 6.9 % (ref 0.0–7.0)

## 2021-02-06 NOTE — Assessment & Plan Note (Signed)
T2DM is controlled based on A1C, and patient now meets criteria for knee replacement surgery based on A1C <7.5%. Will decrease patient's Tresiba to 40 units once daily due to hypoglycemic events. Following instruction patient verbalized understanding of treatment plan. Following instruction patient verbalized understanding of treatment plan.   1. Decreased Tresiba to 40 units once daily and provided sample. 2. Hold (Fiasp) 3 units BID with breakfast and dinner 3. Continued GLP-1 semaglutide (Ozempic) to 2mg  once weekly (Saturdays).  4. Continued SGLT2 farxiga 10mg  daily 5. Continued acarbose 50mg  three times a day with meals 6. Return to fingersticks first thing in AM and then 2-3x/week random 7. Extensively discussed pathophysiology of diabetes, dietary effects on blood sugar control, and recommended lifestyle interventions. 8. Counseled on s/sx of and management of hypoglycemia 9. Next A1C anticipated in January 2023  Follow-up appointment in 8 weeks to review blood glucose readings. Written patient instructions provided.

## 2021-02-06 NOTE — Patient Instructions (Addendum)
[name] it was a pleasure seeing you today.   Please do the following:  Decrease your [name] to 40 units once daily as directed today during your appointment. If you have any questions or if you believe something has occurred because of this change, call me or your doctor to let one of [place] know.  Continue checking blood sugars at home. It's really important that you record these and bring these in to your next doctor's appointment.  Continue making the lifestyle changes we've discussed together during our visit. Diet and exercise play a significant role in improving your blood sugars.  Follow-up with me in on December 15th at 8:30 AM   Hypoglycemia or low blood sugar:   Low blood sugar can happen quickly and may become an emergency if not treated right away.   While this shouldn't happen often, it can be brought upon if you skip a meal or do not eat enough. Also, if your insulin or other diabetes medications are dosed too high, this can cause your blood sugar to go to low.   Warning signs of low blood sugar include: Feeling shaky or dizzy Feeling weak or tired  Excessive hunger Feeling anxious or upset  Sweating even when you aren't exercising  What to do if I experience low blood sugar? Follow the Rule of 15 Check your blood sugar with your meter. If lower than 70, proceed to step 2.  Treat with 15 grams of fast acting carbs which is found in 3-4 glucose tablets. If none are available you can try hard candy, 1 tablespoon of sugar or honey,4 ounces of fruit juice, or 6 ounces of REGULAR soda.  Re-check your sugar in 15 minutes. If it is still below 70, do what you did in step 2 again. If your blood sugar has come back up, go ahead and eat a snack or small meal made up of complex carbs (ex. Whole grains) and protein at this time to avoid recurrence of low blood sugar.

## 2021-02-06 NOTE — Progress Notes (Signed)
Subjective:    Patient ID: [name], female    DOB: 1950/12/30, 70 y.o.   MRN: [id number]  HPI Patient is a 70 y.o. female who presents for diabetes management and Libre 2.0 review. She is in good spirits and presents without assistance. Patient was last seen by Primary Care Provider on 09/19/20. Last seen in pharmacy clinic on 01/23/21.  Patient needs to have an A1C of 7.5% or less to receive her knee replacement surgery.   Insurance coverage/medication affordability: Medicare  Current diabetes medications include: farxiga 10mg , Tresiba 44 units once daily, Ozempic (semaglutide) 2 mg once weekly on Saturdays, Fiasp 3 units with meals (not currently taking due to hypoglycemic episodes when administering), Acarbose 50mg  TID with meals Patient states that She is taking her medications as prescribed. Patient reports adherence with medications.   Do you feel that your medications are working for you?  yes  Have you been experiencing any side effects to the medications prescribed? no  Do you have any problems obtaining medications due to transportation or finances?  no    Patient reported dietary habits: Patient reports that she does not eat on a consistent schedule and sometimes may eat less than three meals a day. - Breakfast: Currently eating oatmeal with a small amount of sugar and cinnamon - Lunch: tuna sandwich - Dinner: air fried chicken with brown rice and broccoli - Snacks: animal crackers, low sugar canned fruits   Patient reports hypoglycemic events without an identifiable cause Patient denies polyuria (increased urination).  Patient denies polyphagia (increased appetite).  Patient denies polydipsia (increased thirst).  Patient denies neuropathy (nerve pain). Patient denies visual changes. Patient reports self foot exams.   Objective:   CGM report from last 14 days     Labs:   Physical Exam Neurological:     Mental Status: She is alert and oriented to person,  place, and time.   Review of Systems  Gastrointestinal:  Negative for nausea and vomiting.   Lab Results  Component Value Date   HGBA1C 6.9 02/06/2021   HGBA1C 7.7 (A) 01/02/2021   HGBA1C 7.7 (A) 12/03/2020    Lipid Panel     Component Value Date/Time   CHOL 113 01/31/2020 0916   TRIG 93 01/31/2020 0916   HDL 46 01/31/2020 0916   CHOLHDL 2.5 01/31/2020 0916   CHOLHDL 2.4 04/21/2016 1438   VLDL 15 04/21/2016 1438   LDLCALC 49 01/31/2020 0916   LDLDIRECT 94 12/07/2007 2019    Assessment/Plan:   T2DM is controlled based on A1C, and patient now meets criteria for knee replacement surgery based on A1C <7.5%. Will decrease patient's Tresiba to 40 units once daily due to hypoglycemic events. Following instruction patient verbalized understanding of treatment plan. Following instruction patient verbalized understanding of treatment plan.   Decreased Tresiba to 40 units once daily and provided sample. Hold (Fiasp) 3 units BID with breakfast and dinner Continued GLP-1 semaglutide (Ozempic) to 2mg  once weekly (Saturdays).  Continued SGLT2 farxiga 10mg  daily Continued acarbose 50mg  three times a day with meals Return to fingersticks first thing in AM and then 2-3x/week random Extensively discussed pathophysiology of diabetes, dietary effects on blood sugar control, and recommended lifestyle interventions. Counseled on s/sx of and management of hypoglycemia Next A1C anticipated in January 2023  Follow-up appointment in 8 weeks to review blood glucose readings. Written patient instructions provided.  This appointment required 30 minutes of direct patient care.  Thank you for involving pharmacy to assist in providing  this patient's care.  Medication Samples have been provided to the patient.  Drug name: Evaristo Bury       Strength: 100 units/mL        Qty: 2 pens  LOT: [id number]  Exp.Date: 07/11/21  The patient has been instructed regarding the correct time, dose, and frequency of taking  this medication, including desired effects and most common side effects.   Cheral Almas 10:03 AM 02/06/2021

## 2021-02-10 ENCOUNTER — Other Ambulatory Visit: Payer: Self-pay | Admitting: Family Medicine

## 2021-02-10 ENCOUNTER — Telehealth: Payer: Self-pay

## 2021-02-10 ENCOUNTER — Other Ambulatory Visit (HOSPITAL_COMMUNITY): Payer: Self-pay

## 2021-02-10 MED ORDER — OZEMPIC (2 MG/DOSE) 8 MG/3ML ~~LOC~~ SOPN
PEN_INJECTOR | SUBCUTANEOUS | 2 refills | Status: DC
  Filled 2021-02-10: qty 3, 28d supply, fill #0
  Filled 2021-03-12: qty 3, 28d supply, fill #1
  Filled 2021-04-07: qty 3, 28d supply, fill #2

## 2021-02-10 NOTE — Telephone Encounter (Signed)
Returned call to patient as requested by voice mail message.  She reports her A1C is 6.9% and now she can have her total knee surgery since A1C is below 7%.  She has been working very hard to lower A1C and achieved her goal.  Congratulated her on the success of lowering A1C so she can get her knee replacement surgery scheduled.

## 2021-02-10 NOTE — Addendum Note (Signed)
Addended by: Cheral Almas on: 02/10/2021 04:13 PM   Modules accepted: Orders

## 2021-02-14 NOTE — Telephone Encounter (Signed)
Received two follow up faxes from pharmacy regarding request. Patient last saw Rachelle on 02/06/21.   Please advise.   Veronda Prude, RN

## 2021-02-14 NOTE — Telephone Encounter (Signed)
Based on chart review, did not appear she was still using these needles. However, will defer to Dr. Nicholaus Bloom, as she has been following this patient closely for her diabetes. Thanks

## 2021-02-18 DIAGNOSIS — M1711 Unilateral primary osteoarthritis, right knee: Secondary | ICD-10-CM | POA: Diagnosis not present

## 2021-02-19 ENCOUNTER — Ambulatory Visit (INDEPENDENT_AMBULATORY_CARE_PROVIDER_SITE_OTHER): Payer: Medicare HMO

## 2021-02-19 ENCOUNTER — Telehealth: Payer: Self-pay | Admitting: Pulmonary Disease

## 2021-02-19 ENCOUNTER — Telehealth: Payer: Self-pay

## 2021-02-19 DIAGNOSIS — I700 Atherosclerosis of aorta: Secondary | ICD-10-CM

## 2021-02-19 DIAGNOSIS — I472 Ventricular tachycardia, unspecified: Secondary | ICD-10-CM

## 2021-02-19 LAB — CUP PACEART REMOTE DEVICE CHECK
Battery Remaining Longevity: 60 mo
Battery Voltage: 2.98 V
Brady Statistic AP VP Percent: 0.65 %
Brady Statistic AP VS Percent: 96.43 %
Brady Statistic AS VP Percent: 0.01 %
Brady Statistic AS VS Percent: 2.91 %
Brady Statistic RA Percent Paced: 94.07 %
Brady Statistic RV Percent Paced: 0.71 %
Date Time Interrogation Session: 20221109022702
HighPow Impedance: 94 Ohm
Implantable Lead Implant Date: 20120120
Implantable Lead Implant Date: 20120120
Implantable Lead Location: 753859
Implantable Lead Location: 753860
Implantable Lead Model: 5076
Implantable Lead Model: 6935
Implantable Pulse Generator Implant Date: 20190618
Lead Channel Impedance Value: 380 Ohm
Lead Channel Impedance Value: 437 Ohm
Lead Channel Impedance Value: 456 Ohm
Lead Channel Pacing Threshold Amplitude: 0.375 V
Lead Channel Pacing Threshold Amplitude: 0.75 V
Lead Channel Pacing Threshold Pulse Width: 0.4 ms
Lead Channel Pacing Threshold Pulse Width: 0.4 ms
Lead Channel Sensing Intrinsic Amplitude: 0.25 mV
Lead Channel Sensing Intrinsic Amplitude: 0.25 mV
Lead Channel Sensing Intrinsic Amplitude: 12.5 mV
Lead Channel Sensing Intrinsic Amplitude: 12.5 mV
Lead Channel Setting Pacing Amplitude: 2 V
Lead Channel Setting Pacing Amplitude: 2.5 V
Lead Channel Setting Pacing Pulse Width: 0.4 ms
Lead Channel Setting Sensing Sensitivity: 0.3 mV

## 2021-02-19 NOTE — Telephone Encounter (Signed)
Fax received from Dr. Weber Cooks with [facility] to perform a Rt total knee replacement on patient.  Patient needs surgery clearance. Patient was seen on 03/14/2020 and has now been scheduled for an OV on 02/28/2021 with Katie Cobb. Office protocol is a risk assessment can be sent to surgeon if patient has been seen in 60 days or less.   Sending to [employer] for risk assessment

## 2021-02-19 NOTE — Telephone Encounter (Signed)
Patient with diagnosis of afib on Eliquis for anticoagulation.    Procedure: right knee replacement Date of procedure: TBD  CHA2DS2-VASc Score = 6  This indicates a 9.7% annual risk of stroke. The patient's score is based upon: CHF History: 1 HTN History: 1 Diabetes History: 1 Stroke History: 0 Vascular Disease History: 1 Age Score: 1 Gender Score: 1   CrCl 2mL/min Platelet count 240K  Per office protocol, patient can hold Eliquis for 3 days prior to procedure.

## 2021-02-19 NOTE — Telephone Encounter (Signed)
   [facility] Pre-operative Risk Assessment    Patient Name: ILIANI VEJAR  DOB: 1951/03/19 MRN: [id number]  HEARTCARE STAFF:  - IMPORTANT!!!!!! Under Visit Info/Reason for Call, type in Other and utilize the format Clearance MM/DD/YY or Clearance TBD. Do not use dashes or single digits. - Please review there is not already an duplicate clearance open for this procedure. - If request is for dental extraction, please clarify the # of teeth to be extracted. - If the patient is currently at the dentist's office, call Pre-Op Callback Staff (MA/nurse) to input urgent request.  - If the patient is not currently in the dentist office, please route to the Pre-Op pool.  Request for surgical clearance:  What type of surgery is being performed? Right knee Replacement  When is this surgery scheduled? TBD  What type of clearance is required (medical clearance vs. Pharmacy clearance to hold med vs. Both)? Both  Are there any medications that need to be held prior to surgery and how long? Eliquis  Practice name and name of physician performing surgery? [facility]; Charlies Constable, MD  What is the office phone number? [place] [phone])   7.   What is the office fax number? [phone]  8.   Anesthesia type (None, local, MAC, general) ? N/A   Orvan July 02/19/2021, 7:39 AM  _________________________________________________________________   (provider comments below)

## 2021-02-19 NOTE — Telephone Encounter (Signed)
Pharmacy, can you please comment on how long Eliquis can be held for upcoming knee procedure?  Thank you!

## 2021-02-20 NOTE — Telephone Encounter (Signed)
   Name: [name]  DOB: Apr 10, 1951  MRN: [id number]   Primary Cardiologist: Thurmon Fair, MD  Chart reviewed as part of pre-operative protocol coverage. Patient was contacted 02/20/2021 in reference to pre-operative risk assessment for pending surgery as outlined below.  [name] was last seen on 10/22/20 by Dr. Royann Shivers.  Since that day, [name] has done well.  Therefore, based on ACC/AHA guidelines, the patient would be at acceptable risk for the planned procedure without further cardiovascular testing.   The patient was advised that if she develops new symptoms prior to surgery to contact our office to arrange for a follow-up visit, and she verbalized understanding.  I will route this recommendation to the requesting party via Epic fax function and remove from pre-op pool. Please call with questions.  [place], [place] 02/20/2021, 8:19 AM

## 2021-02-21 ENCOUNTER — Telehealth: Payer: Self-pay | Admitting: Cardiovascular Disease

## 2021-02-21 ENCOUNTER — Telehealth: Payer: Self-pay | Admitting: Pharmacist

## 2021-02-21 ENCOUNTER — Other Ambulatory Visit (HOSPITAL_COMMUNITY): Payer: Self-pay

## 2021-02-21 DIAGNOSIS — E1140 Type 2 diabetes mellitus with diabetic neuropathy, unspecified: Secondary | ICD-10-CM

## 2021-02-21 DIAGNOSIS — Z794 Long term (current) use of insulin: Secondary | ICD-10-CM

## 2021-02-21 MED ORDER — TORSEMIDE 20 MG PO TABS
20.0000 mg | ORAL_TABLET | Freq: Every day | ORAL | 2 refills | Status: DC
  Filled 2021-02-21: qty 90, 90d supply, fill #0
  Filled 2021-05-22: qty 90, 90d supply, fill #1
  Filled 2021-08-15: qty 90, 90d supply, fill #2

## 2021-02-21 MED ORDER — FAMOTIDINE 20 MG PO TABS
20.0000 mg | ORAL_TABLET | Freq: Every day | ORAL | 0 refills | Status: DC
  Filled 2021-02-21: qty 90, 90d supply, fill #0

## 2021-02-21 MED ORDER — DAPAGLIFLOZIN PROPANEDIOL 10 MG PO TABS
10.0000 mg | ORAL_TABLET | Freq: Every day | ORAL | 0 refills | Status: DC
  Filled 2021-02-21: qty 90, 90d supply, fill #0

## 2021-02-21 NOTE — Telephone Encounter (Signed)
Reviewed and agree.

## 2021-02-21 NOTE — Telephone Encounter (Signed)
*  STAT* If patient is at the pharmacy, call can be transferred to refill team.   1. Which medications need to be refilled? (please list name of each medication and dose if known) torsemide (DEMADEX) 20 MG tablet   2. Which pharmacy/location (including street and city if local pharmacy) is medication to be sent to?  [facility] Phone:  [phone]  Fax:  [phone]      3. Do they need a 30 day or 90 day supply? 90 ds

## 2021-02-21 NOTE — Telephone Encounter (Signed)
Patient called to request refills of her medication. We had been working to switch her medications to [facility]. Sent in 90 day supply of famotidine and farxiga to [facility] with 0 refills. PCP can refill as needed.

## 2021-02-23 ENCOUNTER — Other Ambulatory Visit: Payer: Self-pay | Admitting: Family Medicine

## 2021-02-24 ENCOUNTER — Other Ambulatory Visit (HOSPITAL_COMMUNITY): Payer: Self-pay

## 2021-02-26 ENCOUNTER — Other Ambulatory Visit (HOSPITAL_COMMUNITY): Payer: Self-pay

## 2021-02-26 MED ORDER — INSULIN DEGLUDEC 100 UNIT/ML ~~LOC~~ SOPN
40.0000 [IU] | PEN_INJECTOR | Freq: Every day | SUBCUTANEOUS | 2 refills | Status: DC
Start: 2021-02-26 — End: 2021-06-23
  Filled 2021-02-26: qty 15, 37d supply, fill #0
  Filled 2021-04-03: qty 15, 37d supply, fill #1
  Filled 2021-05-13: qty 15, 37d supply, fill #2

## 2021-02-26 NOTE — Addendum Note (Signed)
Addended by: Cheral Almas on: 02/26/2021 08:49 AM   Modules accepted: Orders

## 2021-02-26 NOTE — Progress Notes (Signed)
No ICM remote transmission received for 02/24/2021 and next ICM transmission scheduled for 03/12/2021.

## 2021-02-26 NOTE — Telephone Encounter (Signed)
Patient called back requesting more [place]. Patient currently injecting 40 units. Patient previously given samples. Will send to pharmacy to see if covered through insurance and if not patient informed we will switch back to [employer].

## 2021-02-27 ENCOUNTER — Ambulatory Visit: Payer: Medicare HMO | Admitting: Podiatrist

## 2021-02-27 ENCOUNTER — Encounter: Payer: Self-pay | Admitting: Podiatrist

## 2021-02-27 ENCOUNTER — Other Ambulatory Visit: Payer: Self-pay

## 2021-02-27 DIAGNOSIS — M2042 Other hammer toe(s) (acquired), left foot: Secondary | ICD-10-CM

## 2021-02-27 DIAGNOSIS — M79676 Pain in unspecified toe(s): Secondary | ICD-10-CM

## 2021-02-27 DIAGNOSIS — Z794 Long term (current) use of insulin: Secondary | ICD-10-CM

## 2021-02-27 DIAGNOSIS — B351 Tinea unguium: Secondary | ICD-10-CM

## 2021-02-27 DIAGNOSIS — L84 Corns and callosities: Secondary | ICD-10-CM

## 2021-02-27 DIAGNOSIS — E1140 Type 2 diabetes mellitus with diabetic neuropathy, unspecified: Secondary | ICD-10-CM

## 2021-02-27 DIAGNOSIS — M2011 Hallux valgus (acquired), right foot: Secondary | ICD-10-CM

## 2021-02-27 DIAGNOSIS — M2041 Other hammer toe(s) (acquired), right foot: Secondary | ICD-10-CM

## 2021-02-27 DIAGNOSIS — M2012 Hallux valgus (acquired), left foot: Secondary | ICD-10-CM

## 2021-02-27 NOTE — Patient Instructions (Signed)

## 2021-02-27 NOTE — Progress Notes (Addendum)
@Patient  ID: [name], female    DOB: [birth year], 70 y.o.   MRN: [id number]  Chief Complaint  Patient presents with   surgical clearance    Referring provider: Alcus Dad, MD  HPI: 70 year old female, never smoker followed for severe obstructive sleep apnea on CPAP 8 cmH2O and moderate persistent asthma. She is a patient of Dr. Bari Mantis and last seen in office 03/14/2020. Past medical history significant for atrial fib, HTN, chronic diastolic HF, GERD, DM II, and obesity.    TEST/EVENTS:  02/2014: severe apnea, corrected with nasal pillows and 8cmH2O 11/04/2015: FVC 2.95 L (102%), FEV1 2.10 L (93%), ratio .71, negative bronchodilator response. TLC 6.02 (109%), RV 109%, ERV 47%, DLCO uncorrected 75%  06/19/2016: FVC 2.74L (95%), FEV1 2.06L (92%), FEV1/FVC 0.75, negative bronchodilator response. DLCO corrected 58%  03/14/2020: OV with Dr. Elsworth Soho for follow up. CPAP with excellent compliance with no residual events, minimal leak. Improved daytime somnolence and fatigue. Rx sent for new CPAP machine. Symbicort 80/4.5, decreased from previous 160 at prev appt. Consider stepdown to ICS next visit if doing well.   02/28/2021: Today - surgical clearance Patient presents today for surgical clearance prior to right total knee replacement with Dr. Noemi Chapel. She is independent at home, ambulates without difficulty, and requires no oxygen use. She will remain inpatient for observation overnight following her procedure. Her breathing is stable without any recent exacerbations. She denies shortness of breath, wheezing, orthopnea, PND, chest pain, or swelling in her legs. She rarely has to use her rescue inhaler and reports only uses it "maybe of once a month". She is compliant with CPAP usage and denies narcolepsy, fatigue, or drowsy driving. She does report that she has to refill the water tank in the middle of the night. Her CPAP machine is relatively new from [employer]. She reports she changes her mask  frequently, as directed, and does not have any increased awakenings or difficulties. Overall, she feels well and is excited for her surgery.    Allergies  Allergen Reactions   Avelox [Moxifloxacin Hcl In Nacl] Other (See Comments)    Cardiac arrest per pt   Nsaids Other (See Comments)    Cardiac arrest per pt    Simvastatin Other (See Comments)   Ace Inhibitors Other (See Comments) and Cough   Jardiance [Empagliflozin] Other (See Comments)    Felt "crazy", fatigue, sweating, denies hypoglycemia while taking   Latex Rash    Immunization History  Administered Date(s) Administered   Fluad Quad(high Dose 65+) 01/17/2019, 01/31/2020, 12/11/2020   H1N1 03/23/2008   Influenza Split 01/06/2011, 01/18/2012   Influenza Whole 01/09/2008, 01/21/2009, 01/14/2010   Influenza,inj,Quad PF,6+ Mos 12/21/2012, 01/04/2014, 01/14/2015, 12/06/2015, 12/04/2016, 12/08/2017   Moderna Sars-Covid-2 Vaccination 05/17/2019, 06/14/2019, 02/09/2020   Pneumococcal Conjugate-13 09/25/2013   Pneumococcal Polysaccharide-23 01/11/2001, 02/22/2012, 04/08/2017   Td 01/11/2001   Tdap 02/22/2012    Past Medical History:  Diagnosis Date   Anemia    Anxiety    Arthritis    Arthritis of carpometacarpal ([place]) joint of thumb 03/26/2016   Asthma    has had multiple hospitalizations for this   Asthma, moderate persistent 12/10/2011   Atrial fibrillation ([place])    ablation x 2 [facility], 01/2006, 2011.  on warfarin   Cardiac arrest [facility]) jan 2012   in hospital for pneumonia when this occured- occured at the hospital   CHF (congestive heart failure) ([place]) 2012   Echo 08/08/10 by [facility]. EF 35-45%. LV systolic function moderately reduced.  Moderate global hypokinesis of LV.  RV systolic function moderately reduced. Mild MR. Trace AR.   Chronic diastolic heart failure ([place]) 01/07/2018   CKD (chronic kidney disease) stage 2, GFR 60-89 ml/min 02/22/2012   Her cr range 1.2-1.5 since Jan 2012 after hospitalization. 10/13  Estimated Creatinine Clearance: 69.3 ml/min (by C-G formula based on Cr of 1).     Complication of anesthesia    difficult time waking up after anesthesia   Depression    Diabetes mellitus    Type 2   Diabetic neuropathy ([place]) 09/18/2011   GASTROESOPHAGEAL REFLUX, NO ESOPHAGITIS 06/10/2006   Qualifier: Diagnosis of  By: Eusebio Friendly     GERD (gastroesophageal reflux disease) 03/23/2018   History of hiatal hernia    HYPERCHOLESTEROLEMIA 06/10/2006   LDL 93 at 01/2011 check-  Continue pravastatin 20mg  po daily.  --discuss health modifications and possibly increaseing dose at next appt.  Cardiology- Dr little- stopped pravachol on 05/21/11- 2/2 allergies?     Hyperlipidemia    Hypertension    ICD (St. Jude Protecta dual-chamber),secondary prevention (VF arrest) January 2012 11/19/2012   Iliotibial band syndrome, left 11/01/2018   Limb pain 06/04/2008   LLE, Baker's cyst in popliteal fossa, no DVT   Long term current use of anticoagulant therapy 08/21/2015   Non-ischemic cardiomyopathy ([place])    echo 08/08/10 - EF [id number] LV and RV systolic function mod reduced   Obesity (BMI 30-39.9) 01/04/2014   OSA on CPAP 06/10/2006   Sleep study 02/2014 : severe apnea, corrected with nasal pillows and 8cmh2o     Osteoarthritis of right knee 08/28/2008   Qualifier: Diagnosis of  By: Barbaraann Barthel MD, Audelia Acton     Primary localized osteoarthritis of right knee    Sleep apnea    wears CPAP nightly   Type 2 diabetes mellitus with diabetic neuropathy, with long-term current use of insulin ([place]) 06/10/2006              Ventricular tachycardia 05/22/2015   Visit for monitoring Tikosyn therapy 08/21/2015   Vocal cord disease     Tobacco History: Social History   Tobacco Use  Smoking Status Never  Smokeless Tobacco Never   Counseling given: Not Answered   Outpatient Medications Prior to Visit  Medication Sig Dispense Refill   acarbose (PRECOSE) 25 MG tablet      acarbose (PRECOSE) 50 MG tablet TAKE 1 TABLET(50 MG)  BY MOUTH THREE TIMES DAILY WITH MEALS 270 tablet 0   acetaminophen (TYLENOL 8 HOUR) 650 MG CR tablet Take 1 tablet (650 mg total) by mouth every 8 (eight) hours as needed for pain. 90 tablet 2   apixaban (ELIQUIS) 5 MG TABS tablet TAKE 1 TABLET(5 MG) BY MOUTH TWICE DAILY 180 tablet 1   bisoprolol (ZEBETA) 10 MG tablet Take 1 tablet (10 mg total) by mouth in the morning and at bedtime. 180 tablet 1   budesonide-formoterol (SYMBICORT) 80-4.5 MCG/ACT inhaler INHALE 2 PUFFS INTO THE LUNGS IN THE MORNING AND AT BEDTIME 10.2 g 11   buPROPion (WELLBUTRIN XL) 150 MG 24 hr tablet Take 1 tablet (150 mg total) by mouth daily. 90 tablet 3   Continuous Blood Gluc Receiver (FREESTYLE LIBRE 2 READER) DEVI Use as directed 1 each 0   Continuous Blood Gluc Sensor (FREESTYLE LIBRE 2 SENSOR) MISC Use as directed. Replace every 14 days     dapagliflozin propanediol (FARXIGA) 10 MG TABS tablet Take 1 tablet (10 mg total) by mouth daily. 90 tablet 0   diclofenac  Sodium (VOLTAREN) 1 % GEL Apply 2 g topically 4 (four) times daily. 100 g 1   dofetilide (TIKOSYN) 250 MCG capsule TAKE 1 CAPSULE(250 MCG) BY MOUTH TWICE DAILY 180 capsule 3   famotidine (PEPCID) 20 MG tablet Take 1 tablet (20 mg total) by mouth daily. 90 tablet 0   fluticasone (FLONASE) 50 MCG/ACT nasal spray instill 1 spray into each nostril twice a day 16 g 5   FLUZONE HIGH-DOSE QUADRIVALENT 0.7 ML SUSY      glucose blood test strip Use as instructed 100 each 12   insulin degludec (TRESIBA) 100 UNIT/ML FlexTouch Pen Inject 40 Units into the skin daily. 15 mL 2   Insulin Pen Needle (PEN NEEDLES) 32G X 4 MM MISC Use 3x/day to administer insulin 100 each 0   levalbuterol (XOPENEX HFA) 45 MCG/ACT inhaler INHALE 1 TO 2 PUFFS BY MOUTH INTO THE LUNGS EVERY 4 HOURS IF NEEDED FOR WHEEZING 1 Inhaler 1   levalbuterol (XOPENEX) 0.63 MG/3ML nebulizer solution One vial in nebulizer four times daily as needed 360 mL 5   montelukast (SINGULAIR) 10 MG tablet TAKE 1 TABLET BY  MOUTH AT BEDTIME 90 tablet 0   polyethylene glycol powder (GLYCOLAX/MIRALAX) 17 GM/SCOOP powder take 17GM (DISSOLVED IN WATER) by mouth once daily THREE TIMES A WEEK if needed as directed 500 g 3   potassium chloride SA (KLOR-CON) 20 MEQ tablet Take 1 tablet (20 mEq total) by mouth daily as directed. (Patient taking differently: Take 1 tablet (20 mEq total) by mouth daily) 30 tablet 11   PRODIGY LANCETS 28G MISC 1 Units by Does not apply route 4 (four) times daily. 100 each 4   rosuvastatin (CRESTOR) 10 MG tablet TAKE 1 TABLET BY MOUTH DAILY 90 tablet 3   Semaglutide, 2 MG/DOSE, (OZEMPIC, 2 MG/DOSE,) 8 MG/3ML SOPN INJECT 2MG  UNDER THE SKIN WEEKLY 3 mL 2   SHINGRIX injection      torsemide (DEMADEX) 20 MG tablet Take 1 tablet (20 mg total) by mouth daily. 90 tablet 2   benzonatate (TESSALON) 200 MG capsule Take 1 capsule (200 mg total) by mouth 2 (two) times daily as needed for cough. (Patient not taking: Reported on 02/06/2021) 28 capsule 0   No facility-administered medications prior to visit.     Review of Systems:   Constitutional: No weight loss or gain, night sweats, fevers, chills, fatigue, or lassitude. HEENT: No headaches, difficulty swallowing, tooth/dental problems, or sore throat. No sneezing, itching, ear ache, nasal congestion, or post nasal drip CV:  No chest pain, orthopnea, PND, swelling in lower extremities, anasarca, dizziness, palpitations, syncope Resp: No shortness of breath with exertion or at rest. No excess mucus or change in color of mucus. No productive or non-productive. No hemoptysis. No wheezing.  No chest wall deformity GI:  No heartburn, indigestion, abdominal pain, nausea, vomiting, diarrhea, change in bowel habits, loss of appetite, bloody stools.  GU: No dysuria, change in color of urine, urgency or frequency.  No flank pain, no hematuria  Skin: No rash, lesions, ulcerations MSK:  No joint pain or swelling.  No decreased range of motion.  No back pain. Neuro:  No dizziness or lightheadedness.  Psych: No depression or anxiety. Mood stable.     Physical Exam:  BP 140/90 (BP Location: Left Arm, Patient Position: Sitting, Cuff Size: Large)   Pulse 100   Temp 98.2 F (36.8 C) (Oral)   Ht 5\' 6"  (1.676 m)   Wt 186 lb 6.4 oz (84.6 kg)  LMP 07/26/2011   SpO2 100%   BMI 30.09 kg/m   GEN: Pleasant, interactive, obese; in no acute distress. HEENT:  Normocephalic and atraumatic. EACs patent bilaterally. TM pearly gray with present light reflex bilaterally. PERRLA. Sclera white. Nasal turbinates pink, moist and patent bilaterally. No rhinorrhea present. Oropharynx pink and moist, without exudate or edema. No lesions, ulcerations, or postnasal drip.  NECK:  Supple w/ fair ROM. No JVD present. Normal carotid impulses w/o bruits. Thyroid symmetrical with no goiter or nodules palpated. No lymphadenopathy.   CV: RRR, no m/r/g, no peripheral edema. Pulses intact, +2 bilaterally. No cyanosis, pallor or clubbing. PULMONARY:  Unlabored, regular breathing. Clear bilaterally A&P w/o wheezes/rales/rhonchi. No accessory muscle use. No dullness to percussion. GI: BS present and normoactive. Soft, non-tender to palpation. No organomegaly or masses detected. No CVA tenderness. MSK: No erythema, warmth or tenderness. Cap refil <2 sec all extrem. No deformities or joint swelling noted.  Neuro: A/Ox3. No focal deficits noted.   Skin: Warm, no lesions or rashe Psych: Normal affect and behavior. Judgement and thought content appropriate.     Lab Results:  CBC    Component Value Date/Time   WBC 6.1 05/04/2019 0909   WBC 6.4 07/23/2017 1531   RBC 4.59 05/04/2019 0909   RBC 4.42 07/23/2017 1531   HGB 13.5 05/04/2019 0909   HCT 40.0 05/04/2019 0909   PLT 240 05/04/2019 0909   MCV 87 05/04/2019 0909   MCH 29.4 05/04/2019 0909   MCH 28.3 07/23/2017 1531   MCHC 33.8 05/04/2019 0909   MCHC 32.6 07/23/2017 1531   RDW 12.6 05/04/2019 0909   LYMPHSABS 3.3 07/23/2017  1531   MONOABS 0.5 07/23/2017 1531   EOSABS 0.1 07/23/2017 1531   BASOSABS 0.0 07/23/2017 1531    BMET    Component Value Date/Time   NA 142 09/19/2020 0944   K 4.4 09/19/2020 0944   CL 101 09/19/2020 0944   CO2 25 09/19/2020 0944   GLUCOSE 136 (H) 09/19/2020 0944   GLUCOSE 137 (H) 07/23/2017 1531   BUN 23 09/19/2020 0944   CREATININE 1.31 (H) 09/19/2020 0944   CREATININE 1.23 (H) 11/23/2016 0812   CALCIUM 8.6 (L) 09/19/2020 0944   GFRNONAA 43 (L) 06/04/2020 1410   GFRNONAA 54 (L) 09/02/2015 0918   GFRAA 49 (L) 06/04/2020 1410   GFRAA 62 09/02/2015 0918    BNP    Component Value Date/Time   BNP 92.1 07/23/2020 1014   BNP 79.5 12/31/2015 1636     Imaging:  CUP PACEART REMOTE DEVICE CHECK  Result Date: 02/19/2021 Scheduled remote reviewed. Normal device function.  Known PAF, on OAC according to previous reports, AF burden is 0.5% of the time, longest episode was 6 minutes Next remote 91 days. Hassell Halim, RN, CCDS, [employer]     PFT Results Latest Ref Rng & Units 06/19/2016 01/01/2016 11/04/2015  FVC-Pre L 2.74 2.93 2.95  FVC-Predicted Pre % 95 101 102  FVC-Post L 2.58 - 2.95  FVC-Predicted Post % 90 - 102  Pre FEV1/FVC % % 75 78 71  Post FEV1/FCV % % 73 - 77  FEV1-Pre L 2.06 2.30 2.10  FEV1-Predicted Pre % 92 102 93  FEV1-Post L 1.88 - 2.26  DLCO uncorrected ml/min/mmHg 16.56 - 20.89  DLCO UNC% % 58 - 73  DLCO corrected ml/min/mmHg 16.46 - 21.36  DLCO COR %Predicted % 58 - 75  DLVA Predicted % 71 - 82  TLC L - - 6.02  TLC %  Predicted % - - 109  RV % Predicted % - - 109    Lab Results  Component Value Date   NITRICOXIDE 47 12/26/2015    Spirometry: FVC 2.5 L (97%), FEV1 2 L (99%), FEV1 FVC ratio 80%    Assessment & Plan:   Encounter for pre-operative respiratory clearance CXR today for pre-operative evaluation. Spirometry today to assess lung function prior to surgery and improved from prior exam. Patient moderate surgical risk given  history of OSA, asthma, obesity, and age, although morbidities well-controlled. Ensure aggressive pulmonary toileting post-operatively.    Risk ameliorating factors are OSA, asthma, and obesity.   Major Pulmonary risks identified in the multifactorial risk analysis are but not limited to a) pneumonia; b) recurrent intubation risk; c) prolonged or recurrent acute respiratory failure needing mechanical ventilation; d) prolonged hospitalization; e) DVT/Pulmonary embolism; f) Acute Pulmonary edema  Recommend 1. Short duration of surgery as much as possible and avoid paralytic if possible 2. Recovery in step down or ICU with Pulmonary consultation 3. DVT prophylaxis 4. Aggressive pulmonary toilet with o2, bronchodilatation, and incentive spirometry and early ambulation   1) RISK FOR PROLONGED MECHANICAL VENTILAION - > 48h  1A) Arozullah - Prolonged mech ventilation risk Arozullah Postperative Pulmonary Risk Score - for mech ventilation dependence >48h [employer], [facility]urg 2000, major non-cardiac surgery) Comment Score  Type of surgery - abd ao aneurysm (27), thoracic (21), neurosurgery / upper abdominal / vascular (21), neck (11) LTKA 0  Emergency Surgery - (11)  0  ALbumin < 3 or poor nutritional state - (9)  0  BUN > 30 -  (8) 23 in June 2022 0  Partial or completely dependent functional status - (7)  0  COPD -  (6) Asthma 6  Age - 25 to 15 (4), > 70  (6) 70 yo 4  TOTAL  10  Risk Stratifcation scores  - < 10 (0.5%), 11-19 (1.8%), 20-27 (4.2%), 28-40 (10.1%), >40 (26.6%)     Patient Instructions  Continue Symbicort 80 2 puffs Twice daily, rinse after Continue levalbuterol (Xopenex) inhaler 1-2 puffs every 6 hours as needed for shortness of breath or wheezing Continue Flonase nasal spray 1 spray Twice daily Continue Singulair At bedtime Continue use of CPAP at night. Continue utilizing for the entire night, like you have been. Follow up with [facility] for trouble shooting of machine  regarding running out of water in the middle of the night. Take your CPAP machine and your inhalers with you to the hospital  Maintain clean equipment, as directed by home health agency.  Change your nasal mask every 30 days, as directed. Be aware of reduced alertness and do not drive or operate heavy machinery if experiencing this or drowsiness.  Exercise encouraged, as tolerated. Healthy weight management discussed. Great job with your weight loss! Avoid or decrease alcohol consumption and medications that make you more sleepy, if possible. Notify if persistent daytime sleepiness occurs even with consistent use of CPAP.   Asthma Action Plan in place and understood by patient. Avoid triggers, when able.  Notify and seek help if symptoms unrelieved by rescue inhaler.   Risk ameliorating factors are asthma and OSA. Major Pulmonary risks identified in the multifactorial risk analysis are but not limited to a) pneumonia; b) recurrent intubation risk; c) prolonged or recurrent acute respiratory failure needing mechanical ventilation; d) prolonged hospitalization; e) DVT/Pulmonary embolism; f) Acute Pulmonary edema  Discussed warning signs post-operatively and ER precautions such as increasing shortness of breath, chest  pain, lightheadedness or dizziness. Ensure you are staying active, as tolerated. Use incentive spirometer every hour, while awake.   Follow up in 3 months with Dr. Elsworth Soho or APP to discuss possible stepdown of Symbicort. If symptoms do not improve or worsen, please contact office for sooner follow up or seek emergency care.      Asthma, moderate persistent Stable and well-controlled. No recent exacerbations or hospitalizations. Consider step down to ICS from Symbicort at next visit, if respiratory status continues. See above plan.   OSA on CPAP CPAP set pressure 8cmH2O. Usage 30/30 days >4hrs 100%. Average usage 7 hr 21 min. AHI 4.2. See above plan.   Obesity (BMI  30-39.9) Continued healthy weight management. See above plan.    Clayton Bibles, NP 02/28/2021  Pt aware and understands NP's role.

## 2021-02-27 NOTE — Progress Notes (Signed)
Remote ICD transmission.   

## 2021-02-28 ENCOUNTER — Ambulatory Visit (INDEPENDENT_AMBULATORY_CARE_PROVIDER_SITE_OTHER): Payer: Medicare HMO

## 2021-02-28 ENCOUNTER — Encounter: Payer: Self-pay | Admitting: Nurse Practitioner

## 2021-02-28 ENCOUNTER — Ambulatory Visit: Payer: Medicare HMO | Admitting: Nurse Practitioner

## 2021-02-28 VITALS — BP 140/90 | HR 100 | Temp 98.2°F | Ht 66.0 in | Wt 186.4 lb

## 2021-02-28 DIAGNOSIS — Z01811 Encounter for preprocedural respiratory examination: Secondary | ICD-10-CM | POA: Diagnosis not present

## 2021-02-28 DIAGNOSIS — J45909 Unspecified asthma, uncomplicated: Secondary | ICD-10-CM | POA: Diagnosis not present

## 2021-02-28 DIAGNOSIS — E669 Obesity, unspecified: Secondary | ICD-10-CM | POA: Diagnosis not present

## 2021-02-28 DIAGNOSIS — Z9989 Dependence on other enabling machines and devices: Secondary | ICD-10-CM

## 2021-02-28 DIAGNOSIS — G4733 Obstructive sleep apnea (adult) (pediatric): Secondary | ICD-10-CM | POA: Diagnosis not present

## 2021-02-28 DIAGNOSIS — J4540 Moderate persistent asthma, uncomplicated: Secondary | ICD-10-CM

## 2021-02-28 DIAGNOSIS — Z01818 Encounter for other preprocedural examination: Secondary | ICD-10-CM | POA: Diagnosis not present

## 2021-02-28 DIAGNOSIS — J449 Chronic obstructive pulmonary disease, unspecified: Secondary | ICD-10-CM | POA: Diagnosis not present

## 2021-02-28 NOTE — Assessment & Plan Note (Addendum)
CXR today for pre-operative evaluation. Spirometry today to assess lung function prior to surgery and improved from prior exam. Patient moderate surgical risk given history of OSA, asthma, obesity, and age, although morbidities well-controlled. Ensure aggressive pulmonary toileting post-operatively.    Risk ameliorating factors are OSA, asthma, and obesity.   Major Pulmonary risks identified in the multifactorial risk analysis are but not limited to a) pneumonia; b) recurrent intubation risk; c) prolonged or recurrent acute respiratory failure needing mechanical ventilation; d) prolonged hospitalization; e) DVT/Pulmonary embolism; f) Acute Pulmonary edema  Recommend 1. Short duration of surgery as much as possible and avoid paralytic if possible 2. Recovery in step down or ICU with Pulmonary consultation 3. DVT prophylaxis 4. Aggressive pulmonary toilet with o2, bronchodilatation, and incentive spirometry and early ambulation   1) RISK FOR PROLONGED MECHANICAL VENTILAION - > 48h  1A) Arozullah - Prolonged mech ventilation risk Arozullah Postperative Pulmonary Risk Score - for mech ventilation dependence >48h [employer], Ann Surg 2000, major non-cardiac surgery) Comment Score  Type of surgery - abd ao aneurysm (27), thoracic (21), neurosurgery / upper abdominal / vascular (21), neck (11) LTKA 0  Emergency Surgery - (11)  0  ALbumin < 3 or poor nutritional state - (9)  0  BUN > 30 -  (8) 23 in June 2022 0  Partial or completely dependent functional status - (7)  0  COPD -  (6) Asthma 6  Age - 35 to 77 (4), > 70  (6) 70 yo 4  TOTAL  10  Risk Stratifcation scores  - < 10 (0.5%), 11-19 (1.8%), 20-27 (4.2%), 28-40 (10.1%), >40 (26.6%)     Patient Instructions  Continue Symbicort 80 2 puffs Twice daily, rinse after Continue levalbuterol (Xopenex) inhaler 1-2 puffs every 6 hours as needed for shortness of breath or wheezing Continue Flonase nasal spray 1 spray Twice daily Continue  Singulair At bedtime Continue use of CPAP at night. Continue utilizing for the entire night, like you have been. Follow up with [facility] for trouble shooting of machine regarding running out of water in the middle of the night. Take your CPAP machine and your inhalers with you to the hospital  Maintain clean equipment, as directed by home health agency.  Change your nasal mask every 30 days, as directed. Be aware of reduced alertness and do not drive or operate heavy machinery if experiencing this or drowsiness.  Exercise encouraged, as tolerated. Healthy weight management discussed. Great job with your weight loss! Avoid or decrease alcohol consumption and medications that make you more sleepy, if possible. Notify if persistent daytime sleepiness occurs even with consistent use of CPAP.   Asthma Action Plan in place and understood by patient. Avoid triggers, when able.  Notify and seek help if symptoms unrelieved by rescue inhaler.   Risk ameliorating factors are asthma and OSA. Major Pulmonary risks identified in the multifactorial risk analysis are but not limited to a) pneumonia; b) recurrent intubation risk; c) prolonged or recurrent acute respiratory failure needing mechanical ventilation; d) prolonged hospitalization; e) DVT/Pulmonary embolism; f) Acute Pulmonary edema  Discussed warning signs post-operatively and ER precautions such as increasing shortness of breath, chest pain, lightheadedness or dizziness. Ensure you are staying active, as tolerated. Use incentive spirometer every hour, while awake.   Follow up in 3 months with Dr. Vassie Loll or APP to discuss possible stepdown of Symbicort. If symptoms do not improve or worsen, please contact office for sooner follow up or seek emergency care.

## 2021-02-28 NOTE — Assessment & Plan Note (Signed)
CPAP set pressure 8cmH2O. Usage 30/30 days >4hrs 100%. Average usage 7 hr 21 min. AHI 4.2. See above plan.

## 2021-02-28 NOTE — Assessment & Plan Note (Signed)
Stable and well-controlled. No recent exacerbations or hospitalizations. Consider step down to ICS from Symbicort at next visit, if respiratory status continues. See above plan.

## 2021-02-28 NOTE — Assessment & Plan Note (Signed)
Continued healthy weight management. See above plan.

## 2021-02-28 NOTE — Patient Instructions (Addendum)
Continue Symbicort 80 2 puffs Twice daily, rinse after Continue levalbuterol (Xopenex) inhaler 1-2 puffs every 6 hours as needed for shortness of breath or wheezing Continue Flonase nasal spray 1 spray Twice daily Continue Singulair At bedtime Continue use of CPAP at night. Continue utilizing for the entire night, like you have been. Follow up with [facility] for trouble shooting of machine regarding running out of water in the middle of the night. Take your CPAP machine and your inhalers with you to the hospital  Maintain clean equipment, as directed by home health agency.  Change your nasal mask every 30 days, as directed. Be aware of reduced alertness and do not drive or operate heavy machinery if experiencing this or drowsiness.  Exercise encouraged, as tolerated. Healthy weight management discussed. Great job with your weight loss! Avoid or decrease alcohol consumption and medications that make you more sleepy, if possible. Notify if persistent daytime sleepiness occurs even with consistent use of CPAP.   Asthma Action Plan in place and understood by patient. Avoid triggers, when able.  Notify and seek help if symptoms unrelieved by rescue inhaler.   Risk ameliorating factors are asthma and OSA. Major Pulmonary risks identified in the multifactorial risk analysis are but not limited to a) pneumonia; b) recurrent intubation risk; c) prolonged or recurrent acute respiratory failure needing mechanical ventilation; d) prolonged hospitalization; e) DVT/Pulmonary embolism; f) Acute Pulmonary edema  Discussed warning signs post-operatively and ER precautions such as increasing shortness of breath, chest pain, lightheadedness or dizziness. Ensure you are staying active, as tolerated. Use incentive spirometer every hour, while awake.   Follow up in 3 months with Dr. Vassie Loll or APP to discuss possible stepdown of Symbicort. If symptoms do not improve or worsen, please contact office for sooner follow up  or seek emergency care.

## 2021-03-02 NOTE — Progress Notes (Signed)
Chief Complaint  Patient presents with   Nail Problem    Diabetic nail care     SUBJECTIVE:  This patient returns to the office for at risk foot care.  This patient requires this care by a professional since this patient will be at risk due to having Diabetes with Neuropathy.  Her calluses and nails are painful with ambulation and shoe gear. She states she is doing well with her hemoglobin A1C and she has gotten it down low enough that she should be eligible to get her knee replaced.  She is hoping to have the surgery soon.    OBJECTIVE: General Appearance  Alert, conversant and in no acute stress.  Vascular  Dorsalis pedis and posterior tibial  pulses are palpable at 1/4 bilaterally.  Capillary return is within normal limits  bilaterally. Temperature is within normal limits  bilaterally.  Neurologic  Senn-Weinstein monofilament wire test is diminished at all 5 test sited bilateral.  Vibratory sensation is also dimished.  Muscle power within normal limits bilaterally.  Nails Thick disfigured discolored nails with subungual debris  from hallux to fifth toes bilaterally. No evidence of bacterial infection or drainage bilaterally.  Orthopedic  pes planus with acquired bunion and hammertoes noted bilateral.   Skin  normotropic skin with large calluses present submet 3/4 bilateral  No signs of infections or ulcers noted.     ASSESSMENT:    ICD-10-CM   1. Type 2 diabetes mellitus with diabetic neuropathy, with long-term current use of insulin (HCC)  E11.40    Z79.4     2. Pain due to onychomycosis of nail  B35.1    M79.609     3. Corns and callosities  L84      PLAN:   Mechanical debridement of nails 1-5  bilaterally performed with a nail nipper.  Filed with dremel without incident.  - Pared the calluses to a safe level with a 15 blade without bleeding or complication. - discussed she might need some new diabetic shoes in 2023 as her current insoles appear worn and her calluses are  forming quickly.  She will let us know if interested.   - she will return for preventive at risk foot care in 3 months and will call sooner if any concerns arise.,

## 2021-03-03 NOTE — Telephone Encounter (Signed)
OV notes and clearance form have been faxed for clearance. Nothing further needed at this time.  

## 2021-03-12 ENCOUNTER — Ambulatory Visit (INDEPENDENT_AMBULATORY_CARE_PROVIDER_SITE_OTHER): Payer: Medicare HMO

## 2021-03-12 ENCOUNTER — Other Ambulatory Visit (HOSPITAL_COMMUNITY): Payer: Self-pay

## 2021-03-12 DIAGNOSIS — Z95810 Presence of automatic (implantable) cardiac defibrillator: Secondary | ICD-10-CM

## 2021-03-12 DIAGNOSIS — I5032 Chronic diastolic (congestive) heart failure: Secondary | ICD-10-CM | POA: Diagnosis not present

## 2021-03-12 NOTE — Progress Notes (Signed)
EPIC Encounter for ICM Monitoring  Patient Name: [name] is a 70 y.o. female Date: 03/12/2021 Primary Care Physican: Maury Dus, MD Primary Cardiologist: Croitoru Electrophysiologist: Croitoru 03/12/2021 Weight: 179-180 lbs   Clinical Status (19-Feb-2021 to 12-Mar-2021) AT/AF 19  Time in AT/AF 0.3 hr/day (1.3%) Longest AT/AF 82 minutes                                       Spoke with patient and heart failure questions reviewed.  Pt asymptomatic for fluid accumulation and feeling well.      Optivol thoracic impedance suggesting normal fluid levels.   Prescribed dosage:  Torsemide 20 mg 1 tablet daily.   Potassium 20 mEq 1 tablet by mouth as directed.   Labs: 09/19/2020 Creatinine 1.31, BUN 23, Potassium 4.4, Sodium 142, GFR 44 08/30/2020 Creatinine 1.32, BUN 18, Potassium 4.1, Sodium 140, GFR 44 07/23/2020 Creatinine 1.11, BUN 17, Potassium 4.1, Sodium 139, GFR 54 07/11/2020 Creatinine 1.31, BUN 16, Potassium 4.8, Sodium 139, GFR 44 06/04/2020 Creatinine 1.28, BUN 21, Potassium 4.3, Sodium 141  A complete set of results can be found in Results Review.   Recommendations:  Recommendation to limit fluid intake to 64 oz daily.  Encouraged to call if experiencing any fluid symptoms.   Follow-up plan: ICM clinic phone appointment on 04/15/2021.   91 day device clinic remote transmission 05/21/2021.     Next office visit scheduled:  04/21/2021 for Dr Royann Shivers.     Copy of ICM check sent to Dr. Royann Shivers.      3 month ICM trend: 03/12/2021.    12-14 Month ICM trend:       [name], RN 03/12/2021 1:42 PM

## 2021-03-20 ENCOUNTER — Other Ambulatory Visit: Payer: Self-pay

## 2021-03-20 ENCOUNTER — Telehealth: Payer: Self-pay | Admitting: Cardiovascular Disease

## 2021-03-20 MED ORDER — POTASSIUM CHLORIDE CRYS ER 20 MEQ PO TBCR
EXTENDED_RELEASE_TABLET | ORAL | 10 refills | Status: DC
  Filled 2021-04-08 – 2021-05-07 (×2): qty 30, 30d supply, fill #0
  Filled 2021-06-16: qty 30, 30d supply, fill #1
  Filled 2021-07-15: qty 30, 30d supply, fill #2
  Filled 2021-08-15: qty 30, 30d supply, fill #3
  Filled 2021-09-23: qty 30, 30d supply, fill #4
  Filled 2021-10-20: qty 30, 30d supply, fill #5

## 2021-03-20 MED ORDER — DOFETILIDE 250 MCG PO CAPS
ORAL_CAPSULE | ORAL | 3 refills | Status: DC
  Filled 2021-05-22: qty 180, 90d supply, fill #0
  Filled 2021-08-15: qty 180, 90d supply, fill #1

## 2021-03-20 NOTE — Telephone Encounter (Signed)
*  STAT* If patient is at the pharmacy, call can be transferred to refill team.   1. Which medications need to be refilled? (please list name of each medication and dose if known)  dofetilide (TIKOSYN) 250 MCG capsule potassium chloride SA (KLOR-CON) 20 MEQ tabl  2. Which pharmacy/location (including street and city if local pharmacy) is medication to be sent to? [facility]  3. Do they need a 30 day or 90 day supply? 90 day

## 2021-03-27 ENCOUNTER — Other Ambulatory Visit (HOSPITAL_COMMUNITY): Payer: Self-pay

## 2021-03-27 ENCOUNTER — Ambulatory Visit (INDEPENDENT_AMBULATORY_CARE_PROVIDER_SITE_OTHER): Payer: Medicare HMO | Admitting: Pharmacist

## 2021-03-27 ENCOUNTER — Other Ambulatory Visit: Payer: Self-pay | Admitting: Family Medicine

## 2021-03-27 ENCOUNTER — Other Ambulatory Visit: Payer: Self-pay | Admitting: Cardiovascular Disease

## 2021-03-27 ENCOUNTER — Other Ambulatory Visit: Payer: Self-pay

## 2021-03-27 ENCOUNTER — Other Ambulatory Visit: Payer: Self-pay | Admitting: Pulmonary Disease

## 2021-03-27 DIAGNOSIS — E1140 Type 2 diabetes mellitus with diabetic neuropathy, unspecified: Secondary | ICD-10-CM | POA: Diagnosis not present

## 2021-03-27 DIAGNOSIS — Z794 Long term (current) use of insulin: Secondary | ICD-10-CM | POA: Diagnosis not present

## 2021-03-27 MED ORDER — ROSUVASTATIN CALCIUM 10 MG PO TABS
ORAL_TABLET | ORAL | 2 refills | Status: DC
  Filled 2021-03-27: qty 90, 90d supply, fill #0
  Filled 2021-06-20: qty 90, 90d supply, fill #1
  Filled 2021-09-17: qty 90, 90d supply, fill #2

## 2021-03-27 MED ORDER — BUPROPION HCL ER (XL) 150 MG PO TB24
150.0000 mg | ORAL_TABLET | Freq: Every day | ORAL | 3 refills | Status: DC
  Filled 2021-03-27: qty 90, 90d supply, fill #0
  Filled 2021-06-20: qty 90, 90d supply, fill #1
  Filled 2021-09-17: qty 90, 90d supply, fill #2
  Filled 2021-12-16: qty 90, 90d supply, fill #3

## 2021-03-27 MED ORDER — APIXABAN 5 MG PO TABS
ORAL_TABLET | ORAL | 1 refills | Status: DC
  Filled 2021-03-27: qty 180, 90d supply, fill #0

## 2021-03-27 MED ORDER — BISOPROLOL FUMARATE 10 MG PO TABS
10.0000 mg | ORAL_TABLET | Freq: Two times a day (BID) | ORAL | 1 refills | Status: DC
  Filled 2021-03-27: qty 180, 90d supply, fill #0
  Filled 2021-06-20: qty 180, 90d supply, fill #1

## 2021-03-27 MED ORDER — INSULIN PEN NEEDLE 32G X 4 MM MISC
0 refills | Status: DC
  Filled 2021-03-27: qty 100, 90d supply, fill #0

## 2021-03-27 NOTE — Patient Instructions (Addendum)
Miss Dierolf it was a pleasure seeing you today.   Please do the following:  Continue your medications as directed today during your appointment. If you have any questions or if you believe something has occurred because of this change, call me or your doctor to let one of [place] know.  Continue checking blood sugars at home. It's really important that you record these and bring these in to your next doctor's appointment.  Continue making the lifestyle changes we've discussed together during our visit. Diet and exercise play a significant role in improving your blood sugars.  Follow-up with me in two months Call [facility] [phone]  Hypoglycemia or low blood sugar:   Low blood sugar can happen quickly and may become an emergency if not treated right away.   While this shouldn't happen often, it can be brought upon if you skip a meal or do not eat enough. Also, if your insulin or other diabetes medications are dosed too high, this can cause your blood sugar to go to low.   Warning signs of low blood sugar include: Feeling shaky or dizzy Feeling weak or tired  Excessive hunger Feeling anxious or upset  Sweating even when you aren't exercising  What to do if I experience low blood sugar? Follow the Rule of 15 Check your blood sugar with your meter. If lower than 70, proceed to step 2.  Treat with 15 grams of fast acting carbs which is found in 3-4 glucose tablets. If none are available you can try hard candy, 1 tablespoon of sugar or honey,4 ounces of fruit juice, or 6 ounces of REGULAR soda.  Re-check your sugar in 15 minutes. If it is still below 70, do what you did in step 2 again. If your blood sugar has come back up, go ahead and eat a snack or small meal made up of complex carbs (ex. Whole grains) and protein at this time to avoid recurrence of low blood sugar.

## 2021-03-27 NOTE — Assessment & Plan Note (Signed)
T2DM is controlled based on A1C, and patient now meets criteria for knee replacement surgery based on A1C <7.5%. In the future can consider decreasing patient's insulin further, but will continue current regimen for now as patient no longer having any hypoglycemia. Following instruction patient verbalized understanding of treatment plan. Following instruction patient verbalized understanding of treatment plan.   1. Continued Tresiba to 40 units once daily and provided sample. 2. Continued GLP-1 semaglutide (Ozempic) to 2mg  once weekly (Saturdays).  3. Continued SGLT2 farxiga 10mg  daily 4. Continued acarbose 50mg  three times a day with meals 5. Return to fingersticks first thing in AM and then 2-3x/week random 6. Extensively discussed pathophysiology of diabetes, dietary effects on blood sugar control, and recommended lifestyle interventions. 7. Counseled on s/sx of and management of hypoglycemia 8. Next A1C anticipated in January 2023  Follow-up appointment in 8 weeks to review blood glucose readings

## 2021-03-27 NOTE — Progress Notes (Signed)
Subjective:    Patient ID: MARYANNE HUNEYCUTT, female    DOB: February 20, 1951, 70 y.o.   MRN: [id number]  HPI Patient is a 70 y.o. female who presents for diabetes management. She is in good spirits and presents without assistance. Patient was last seen by Primary Care Provider on 09/19/20. Last seen in pharmacy clinic on 02/06/21.  Patient needs to have an A1C of 7.5% or less to receive her knee replacement surgery.   Insurance coverage/medication affordability: Medicare  Current diabetes medications include: farxiga 10mg , Tresiba 44 units once daily, Ozempic (semaglutide) 2 mg once weekly on Saturdays, Acarbose 50mg  TID with meals Patient states that She is taking her medications as prescribed. Patient reports adherence with medications.   Do you feel that your medications are working for you?  yes  Have you been experiencing any side effects to the medications prescribed? no  Do you have any problems obtaining medications due to transportation or finances?  no    Patient reported dietary habits: Patient reports that she does not eat on a consistent schedule and sometimes may eat less than three meals a day. - Breakfast: Currently eating oatmeal with a small amount of sugar and cinnamon - Lunch: tuna sandwich - Dinner: air fried chicken with brown rice and broccoli - Snacks: animal crackers, low sugar canned fruits   Patient denies hypoglycemic events without an identifiable cause Patient denies polyuria (increased urination).  Patient denies polyphagia (increased appetite).  Patient denies polydipsia (increased thirst).  Patient denies neuropathy (nerve pain). Patient denies visual changes. Patient reports self foot exams.   Self-reported fasting blood glucose readings:, 119, 90; highest 125-130  Objective:   Labs:   Physical Exam Neurological:     Mental Status: She is alert and oriented to person, place, and time.   Review of Systems  Gastrointestinal:  Negative for nausea  and vomiting.   Lab Results  Component Value Date   HGBA1C 6.9 02/06/2021   HGBA1C 7.7 (A) 01/02/2021   HGBA1C 7.7 (A) 12/03/2020   Vitals:   03/27/21 0838  Weight: 184 lb 12.8 oz (83.8 kg)     Lipid Panel     Component Value Date/Time   CHOL 113 01/31/2020 0916   TRIG 93 01/31/2020 0916   HDL 46 01/31/2020 0916   CHOLHDL 2.5 01/31/2020 0916   CHOLHDL 2.4 04/21/2016 1438   VLDL 15 04/21/2016 1438   LDLCALC 49 01/31/2020 0916   LDLDIRECT 94 12/07/2007 2019    Assessment/Plan:   T2DM is controlled based on A1C, and patient now meets criteria for knee replacement surgery based on A1C <7.5%. In the future can consider decreasing patient's insulin further, but will continue current regimen for now as patient no longer having any hypoglycemia. Following instruction patient verbalized understanding of treatment plan. Following instruction patient verbalized understanding of treatment plan.   Continued Tresiba to 40 units once daily and provided sample. Continued GLP-1 semaglutide (Ozempic) to 2mg  once weekly (Saturdays).  Continued SGLT2 farxiga 10mg  daily Continued acarbose 50mg  three times a day with meals Return to fingersticks first thing in AM and then 2-3x/week random Extensively discussed pathophysiology of diabetes, dietary effects on blood sugar control, and recommended lifestyle interventions. Counseled on s/sx of and management of hypoglycemia Next A1C anticipated in January 2023  Follow-up appointment in 8 weeks to review blood glucose readings. Written patient instructions provided.  This appointment required 30 minutes of direct patient care.  Thank you for involving pharmacy to assist in providing this  patient's care.

## 2021-03-27 NOTE — Telephone Encounter (Signed)
Prescription refill request for Eliquis received. Indication:A fib Last office visit:7/22 Scr:1.3 Age: 70 Weight:83.8 kg  Prescription refilled

## 2021-03-28 ENCOUNTER — Other Ambulatory Visit (HOSPITAL_COMMUNITY): Payer: Self-pay

## 2021-03-30 DIAGNOSIS — G4733 Obstructive sleep apnea (adult) (pediatric): Secondary | ICD-10-CM | POA: Diagnosis not present

## 2021-03-30 DIAGNOSIS — J45909 Unspecified asthma, uncomplicated: Secondary | ICD-10-CM | POA: Diagnosis not present

## 2021-03-30 DIAGNOSIS — J449 Chronic obstructive pulmonary disease, unspecified: Secondary | ICD-10-CM | POA: Diagnosis not present

## 2021-03-31 ENCOUNTER — Other Ambulatory Visit (HOSPITAL_COMMUNITY): Payer: Self-pay

## 2021-03-31 MED ORDER — MONTELUKAST SODIUM 10 MG PO TABS
10.0000 mg | ORAL_TABLET | Freq: Every day | ORAL | 0 refills | Status: DC
  Filled 2021-03-31: qty 90, 90d supply, fill #0

## 2021-04-03 ENCOUNTER — Other Ambulatory Visit (HOSPITAL_COMMUNITY): Payer: Self-pay

## 2021-04-03 NOTE — Progress Notes (Signed)
Surgery orders requested via Epic inbox. °

## 2021-04-08 ENCOUNTER — Other Ambulatory Visit (HOSPITAL_COMMUNITY): Payer: Self-pay

## 2021-04-08 DIAGNOSIS — H35342 Macular cyst, hole, or pseudohole, left eye: Secondary | ICD-10-CM | POA: Diagnosis not present

## 2021-04-08 DIAGNOSIS — E119 Type 2 diabetes mellitus without complications: Secondary | ICD-10-CM | POA: Diagnosis not present

## 2021-04-08 DIAGNOSIS — Z0100 Encounter for examination of eyes and vision without abnormal findings: Secondary | ICD-10-CM | POA: Diagnosis not present

## 2021-04-08 DIAGNOSIS — H5200 Hypermetropia, unspecified eye: Secondary | ICD-10-CM | POA: Diagnosis not present

## 2021-04-10 ENCOUNTER — Ambulatory Visit: Payer: Self-pay | Admitting: Physician Assistant

## 2021-04-10 DIAGNOSIS — M1711 Unilateral primary osteoarthritis, right knee: Secondary | ICD-10-CM | POA: Diagnosis not present

## 2021-04-10 DIAGNOSIS — G8929 Other chronic pain: Secondary | ICD-10-CM

## 2021-04-10 NOTE — H&P (Signed)
TOTAL KNEE ADMISSION H&P  Patient is being admitted for right total knee arthroplasty.  Subjective:  Chief Complaint:right knee pain.  HPI: [name], 70 y.o. female, has a history of pain and functional disability in the right knee due to arthritis and has failed non-surgical conservative treatments for greater than 12 weeks to includecorticosteriod injections, use of assistive devices, and activity modification.  Onset of symptoms was gradual, starting 7 years ago with gradually worsening course since that time. The patient noted no past surgery on the right knee(s).  Patient currently rates pain in the right knee(s) at 8 out of 10 with activity. Patient has night pain, worsening of pain with activity and weight bearing, pain that interferes with activities of daily living, pain with passive range of motion, crepitus, and joint swelling.  Patient has evidence of periarticular osteophytes, joint subluxation, and joint space narrowing by imaging studies. There is no active infection.  Patient Active Problem List   Diagnosis Date Noted   Encounter for pre-operative respiratory clearance 02/28/2021   Aortic atherosclerosis ([place]) 02/19/2021   Screen for colon cancer 09/19/2020   Left ear pain 06/25/2020   Chronic renal impairment, stage 3a ([place]) 06/04/2020   Pain in both lower extremities 05/01/2020   Iliotibial band syndrome, left 11/01/2018   GERD (gastroesophageal reflux disease) 03/23/2018   Chronic diastolic heart failure ([place]) 01/07/2018   Mild obesity 01/07/2018   Arthritis of carpometacarpal ([place]) joint of thumb 03/26/2016   Primary localized osteoarthritis of right knee    Visit for monitoring Tikosyn therapy 08/21/2015   Long term current use of anticoagulant therapy 08/21/2015   Ventricular tachycardia 05/22/2015   Obesity (BMI 30-39.9) 01/04/2014   ICD (St. Jude Protecta dual-chamber),secondary prevention (VF arrest) January 2012 11/19/2012   Asthma, moderate persistent  12/10/2011   Diabetic neuropathy ([place]) 09/18/2011   Osteoarthritis of right knee 08/28/2008   Type 2 diabetes mellitus with diabetic neuropathy, with long-term current use of insulin ([place]) 06/10/2006   HYPERCHOLESTEROLEMIA 06/10/2006   Essential hypertension 06/10/2006   PAF (paroxysmal atrial fibrillation) ([place]) 06/10/2006   OSA on CPAP 06/10/2006   Past Medical History:  Diagnosis Date   Anemia    Anxiety    Arthritis    Arthritis of carpometacarpal ([facility]) joint of thumb 03/26/2016   Asthma    has had multiple hospitalizations for this   Asthma, moderate persistent 12/10/2011   Atrial fibrillation ([place])    ablation x 2 [facility], 01/2006, 2011.  on warfarin   Cardiac arrest [facility]) jan 2012   in hospital for pneumonia when this occured- occured at the hospital   CHF (congestive heart failure) ([place]) 2012   Echo 08/08/10 by [facility]. EF 35-45%. LV systolic function moderately reduced. Moderate global hypokinesis of LV.  RV systolic function moderately reduced. Mild MR. Trace AR.   Chronic diastolic heart failure ([place]) 01/07/2018   CKD (chronic kidney disease) stage 2, GFR 60-89 ml/min 02/22/2012   Her cr range 1.2-1.5 since Jan 2012 after hospitalization. 10/13 Estimated Creatinine Clearance: 69.3 ml/min (by C-G formula based on Cr of 1).     Complication of anesthesia    difficult time waking up after anesthesia   Depression    Diabetes mellitus    Type 2   Diabetic neuropathy ([place]) 09/18/2011   GASTROESOPHAGEAL REFLUX, NO ESOPHAGITIS 06/10/2006   Qualifier: Diagnosis of  By: Eusebio Friendly     GERD (gastroesophageal reflux disease) 03/23/2018   History of hiatal hernia    HYPERCHOLESTEROLEMIA  06/10/2006   LDL 93 at 01/2011 check-  Continue pravastatin 20mg  po daily.  --discuss health modifications and possibly increaseing dose at next appt.  Cardiology- Dr little- stopped pravachol on 05/21/11- 2/2 allergies?     Hyperlipidemia    Hypertension    ICD (St. Jude Protecta  dual-chamber),secondary prevention (VF arrest) January 2012 11/19/2012   Iliotibial band syndrome, left 11/01/2018   Limb pain 06/04/2008   LLE, Baker's cyst in popliteal fossa, no DVT   Long term current use of anticoagulant therapy 08/21/2015   Non-ischemic cardiomyopathy (HCC)    echo 08/08/10 - EF 35-45% LV and RV systolic function mod reduced   Obesity (BMI 30-39.9) 01/04/2014   OSA on CPAP 06/10/2006   Sleep study 02/2014 : severe apnea, corrected with nasal pillows and 8cmh2o     Osteoarthritis of right knee 08/28/2008   Qualifier: Diagnosis of  By: 08/30/2008 MD, Pearletha Forge     Primary localized osteoarthritis of right knee    Sleep apnea    wears CPAP nightly   Type 2 diabetes mellitus with diabetic neuropathy, with long-term current use of insulin (HCC) 06/10/2006              Ventricular tachycardia 05/22/2015   Visit for monitoring Tikosyn therapy 08/21/2015   Vocal cord disease     Past Surgical History:  Procedure Laterality Date   BREAST EXCISIONAL BIOPSY Right 1999   BREAST LUMPECTOMY Right    CARDIAC DEFIBRILLATOR PLACEMENT  05/02/10   Medtronic Protecta XT-DR for CHF-VT, last download 04/12/12   CHOLECYSTECTOMY     COLONOSCOPY     HAMMER TOE SURGERY Right    ICD GENERATOR CHANGEOUT N/A 09/28/2017   Procedure: ICD GENERATOR CHANGEOUT;  Surgeon: 09/30/2017, MD;  Location: [facility] INVASIVE [facility];  Service: Cardiovascular;  Laterality: N/A;   KNEE ARTHROSCOPY Right    PAF Ablation     By Dr Thurmon Fair. Now sees Dr Sampson Goon at [facility]   TOTAL KNEE ARTHROPLASTY Left 09/17/2014   Procedure: TOTAL KNEE ARTHROPLASTY;  Surgeon: 11/17/2014, MD;  Location: [facility] OR;  Service: Orthopedics;  Laterality: Left;  pt has ICD   TUBAL LIGATION      Current Outpatient Medications  Medication Sig Dispense Refill Last Dose   acarbose (PRECOSE) 50 MG tablet TAKE 1 TABLET(50 MG) BY MOUTH THREE TIMES DAILY WITH MEALS 270 tablet 0    acetaminophen (TYLENOL 8 HOUR) 650 MG CR tablet Take 1 tablet (650 mg total)  by mouth every 8 (eight) hours as needed for pain. 90 tablet 2    apixaban (ELIQUIS) 5 MG TABS tablet TAKE 1 TABLET(5 MG) BY MOUTH TWICE DAILY 180 tablet 1    bisoprolol (ZEBETA) 10 MG tablet Take 1 tablet (10 mg total) by mouth in the morning and at bedtime. 180 tablet 1    budesonide-formoterol (SYMBICORT) 80-4.5 MCG/ACT inhaler INHALE 2 PUFFS INTO THE LUNGS IN THE MORNING AND AT BEDTIME 10.2 g 11    buPROPion (WELLBUTRIN XL) 150 MG 24 hr tablet Take 1 tablet (150 mg total) by mouth daily. 90 tablet 3    Continuous Blood Gluc Receiver (FREESTYLE LIBRE 2 READER) DEVI Use as directed 1 each 0    Continuous Blood Gluc Sensor (FREESTYLE LIBRE 2 SENSOR) MISC Use as directed. Replace every 14 days      dapagliflozin propanediol (FARXIGA) 10 MG TABS tablet Take 1 tablet (10 mg total) by mouth daily. 90 tablet 0    diclofenac Sodium (VOLTAREN) 1 % GEL Apply 2  g topically 4 (four) times daily. (Patient taking differently: Apply 2 g topically daily as needed (pain).) 100 g 1    dofetilide (TIKOSYN) 250 MCG capsule TAKE 1 CAPSULE(250 MCG) BY MOUTH TWICE DAILY 180 capsule 3    famotidine (PEPCID) 20 MG tablet Take 1 tablet (20 mg total) by mouth daily. 90 tablet 0    fluticasone (FLONASE) 50 MCG/ACT nasal spray instill 1 spray into each nostril twice a day (Patient not taking: Reported on 04/03/2021) 16 g 5    FLUZONE HIGH-DOSE QUADRIVALENT 0.7 ML SUSY  (Patient not taking: Reported on 03/27/2021)      glucose blood test strip Use as instructed 100 each 12    insulin degludec (TRESIBA) 100 UNIT/ML FlexTouch Pen Inject 40 Units into the skin daily. 15 mL 2    Insulin Pen Needle 32G X 4 MM MISC Use as directed with insulin once daily 100 each 0    levalbuterol (XOPENEX HFA) 45 MCG/ACT inhaler INHALE 1 TO 2 PUFFS BY MOUTH INTO THE LUNGS EVERY 4 HOURS IF NEEDED FOR WHEEZING 1 Inhaler 1    levalbuterol (XOPENEX) 0.63 MG/3ML nebulizer solution One vial in nebulizer four times daily as needed 360 mL 5     montelukast (SINGULAIR) 10 MG tablet TAKE 1 TABLET BY MOUTH AT BEDTIME 90 tablet 0    polyethylene glycol powder (GLYCOLAX/MIRALAX) 17 GM/SCOOP powder take 17GM (DISSOLVED IN WATER) by mouth once daily THREE TIMES A WEEK if needed as directed (Patient not taking: Reported on 04/03/2021) 500 g 3    potassium chloride SA (KLOR-CON M) 20 MEQ tablet Take 1 tablet by mouth daily as directed. 30 tablet 10    PRODIGY LANCETS 28G MISC 1 Units by Does not apply route 4 (four) times daily. 100 each 4    rosuvastatin (CRESTOR) 10 MG tablet TAKE 1 TABLET BY MOUTH DAILY 90 tablet 2    Semaglutide, 2 MG/DOSE, (OZEMPIC, 2 MG/DOSE,) 8 MG/3ML SOPN INJECT 2MG  UNDER THE SKIN WEEKLY 3 mL 2    SHINGRIX injection  (Patient not taking: Reported on 03/27/2021)      torsemide (DEMADEX) 20 MG tablet Take 1 tablet (20 mg total) by mouth daily. 90 tablet 2    No current facility-administered medications for this visit.   Allergies  Allergen Reactions   Avelox [Moxifloxacin Hcl In Nacl] Other (See Comments)    Cardiac arrest per pt   Nsaids Other (See Comments)    Cardiac arrest per pt    Simvastatin Other (See Comments)    Muscle pain   Ace Inhibitors Other (See Comments) and Cough   Jardiance [Empagliflozin] Other (See Comments)    Felt "crazy", fatigue, sweating, denies hypoglycemia while taking   Latex Rash    Social History   Tobacco Use   Smoking status: Never   Smokeless tobacco: Never  Substance Use Topics   Alcohol use: No    Family History  Problem Relation Age of Onset   Diabetes Father    Hypertension Father    Diabetes Brother    Hypertension Brother    Lung disease Neg Hx    Cancer Neg Hx    Rheumatologic disease Neg Hx      Review of Systems  Musculoskeletal:  Positive for arthralgias.  All other systems reviewed and are negative.  Objective:  Physical Exam Constitutional:      General: She is not in acute distress.    Appearance: Normal appearance.  HENT:     Head:  Normocephalic  and atraumatic.  Eyes:     Extraocular Movements: Extraocular movements intact.     Pupils: Pupils are equal, round, and reactive to light.  Cardiovascular:     Rate and Rhythm: Normal rate. Rhythm irregular.     Pulses: Normal pulses.     Heart sounds: No murmur heard. Pulmonary:     Effort: Pulmonary effort is normal.     Breath sounds: Normal breath sounds.  Abdominal:     General: Abdomen is flat. Bowel sounds are normal. There is no distension.     Palpations: Abdomen is soft.     Tenderness: There is no abdominal tenderness.  Musculoskeletal:     Cervical back: Normal range of motion and neck supple.     Comments: Height: 5?6?Marland Kitchen  Weight: 182 pounds.  BMI: 29.4.  Her right knee range of motion is approximately 5-110 degrees.  She is tender at the medial and lateral joint lines.  She has a mild knee effusion.  She is stable to varus and valgus stress.  She has a varus deformity of the right knee that is more noticeable with ambulation.  She ambulates with a moderately antalgic gait.  She has a negative anterior and posterior drawer of the right knee.  She has intact active knee extension.  Left knee has a minimal knee effusion.  Range of motion 0-120 degrees.  Well healed anterior knee incision.  Non-tender to palpation about the knee.  She is stable to varus and valgus stress.  Motor: 5/5 strength throughout bilateral lower extremities.  Sensation is normal to light touch below both knees.  She has a 2+ dorsalis pedis pulse in both feet.  Skin: She has no ulcerations or lesions of the bilateral lower extremities.    Lymphadenopathy:     Cervical: No cervical adenopathy.  Skin:    General: Skin is warm and dry.     Findings: No erythema or rash.  Neurological:     General: No focal deficit present.     Mental Status: She is alert and oriented to person, place, and time.  Psychiatric:        Mood and Affect: Mood normal.        Behavior: Behavior normal.    Vital signs  in last 24 hours: @VSRANGES @  Labs:   Estimated body mass index is 29.83 kg/m as calculated from the following:   Height as of 02/28/21: 5\' 6"  (1.676 m).   Weight as of 03/27/21: 83.8 kg.   Imaging Review Plain radiographs demonstrate severe degenerative joint disease of the right knee(s). The overall alignment ismild varus. The bone quality appears to be good for age and reported activity level.      Assessment/Plan:  End stage arthritis, right knee   The patient history, physical examination, clinical judgment of the provider and imaging studies are consistent with end stage degenerative joint disease of the right knee(s) and total knee arthroplasty is deemed medically necessary. The treatment options including medical management, injection therapy arthroscopy and arthroplasty were discussed at length. The risks and benefits of total knee arthroplasty were presented and reviewed. The risks due to aseptic loosening, infection, stiffness, patella tracking problems, thromboembolic complications and other imponderables were discussed. The patient acknowledged the explanation, agreed to proceed with the plan and consent was signed. Patient is being admitted for inpatient treatment for surgery, pain control, PT, OT, prophylactic antibiotics, VTE prophylaxis, progressive ambulation and ADL's and discharge planning. The patient is planning to be discharged  home  with outpt PT.    Anticipated LOS equal to or greater than 2 midnights due to - Age 19 and older with one or more of the following:  - Obesity  - Expected need for hospital services (PT, OT, Nursing) required for safe  discharge  - Anticipated need for postoperative skilled nursing care or inpatient rehab  - Active co-morbidities: Diabetes, Heart Failure, Cardiac Arrhythmia, and Anemia OR   - Unanticipated findings during/Post Surgery: None  - Patient is a high risk of re-admission due to: None

## 2021-04-11 ENCOUNTER — Encounter: Payer: Self-pay | Admitting: Cardiovascular Disease

## 2021-04-11 NOTE — Progress Notes (Signed)
PERIOPERATIVE PRESCRIPTION FOR IMPLANTED CARDIAC DEVICE PROGRAMMING  Patient Information: Name:  [name]  DOB:  09/03/1950  MRN:  [id number]    Planned Procedure:  Right total knee arthroplasty  Surgeon:  Dr. Blanchie Dessert  Date of Procedure:  04/28/21  Cautery will be used.  Position during surgery:  unknown   Please send documentation back to:  [facility] (Fax # [phone])   Device Information:  Clinic EP Physician:  Dr. Royann Shivers  Device Type:  Defibrillator Manufacturer and Phone #:  Medtronic: [id number] Pacemaker Dependent?:  No. Date of Last Device Check:  03/12/21 Normal Device Function?:  Yes.    Electrophysiologist's Recommendations:  Have magnet available. Provide continuous ECG monitoring when magnet is used or reprogramming is to be performed.  Procedure should not interfere with device function.  No device programming or magnet placement needed.  Per Device Clinic Standing Orders, Dorathy Daft, RN  3:16 PM 04/11/2021

## 2021-04-11 NOTE — Progress Notes (Addendum)
COVID swab appointment: 04/24/21 @ 0945  COVID Vaccine Completed: yes  x2 Date COVID Vaccine completed: 05/17/19, 06/14/19 Has received booster: 02/09/20 COVID vaccine manufacturer: Pfizer    Moderna   [employer]   Date of COVID positive in last 90 days: no  PCP - Maury Dus, MD Cardiologist - Thurmon Fair, MD  Cardiac clearance by Manson Passey 02/20/21 in Epic Clearance Cheral Almas 03/27/21 in Epic Respiratory clearance 02/28/21 by Micheline Maze in Epic  Chest x-ray - 02/28/21 Epic EKG - 10/23/20 Epic Stress Test - 01/02/09 care ECHO - 09/19/18 Epic Cardiac Cath - 2012 Epic Pacemaker/ICD device last checked: 02/19/21 Epic Spinal Cord Stimulator: n/a  Sleep Study - yes, positive CPAP - yes, every night. Pt will bring mask and tubing DOS  Fasting Blood Sugar - 98-140s Checks Blood Sugar _2_ times a day  Blood Thinner Instructions: Eliquis, hold 3 days Aspirin Instructions: Last Dose:  Activity level: Can perform activities of daily living without stopping and without symptoms of chest pain or shortness of breath. No stairs due to knee pain     Anesthesia review: DM 2, HTN, PAF, v tach, CHF, asthma, OSA, cardiomyopathy, ICD  Patient denies shortness of breath, fever, cough and chest pain at PAT appointment   Patient verbalized understanding of instructions that were given to them at the PAT appointment. Patient was also instructed that they will need to review over the PAT instructions again at home before surgery.

## 2021-04-11 NOTE — Patient Instructions (Addendum)
DUE TO COVID-19 ONLY ONE VISITOR IS ALLOWED TO COME WITH YOU AND STAY IN THE WAITING ROOM ONLY DURING PRE OP AND PROCEDURE.   **NO VISITORS ARE ALLOWED IN THE SHORT STAY AREA OR RECOVERY ROOM!!**  IF YOU WILL BE ADMITTED INTO THE HOSPITAL YOU ARE ALLOWED ONLY TWO SUPPORT PEOPLE DURING VISITATION HOURS ONLY (7AM -8PM)   The support person(s) may change daily. The support person(s) must pass our screening, gel in and out, and wear a mask at all times, including in the patients room. Patients must also wear a mask when staff or their support person are in the room.  No visitors under the age of 22. Any visitor under the age of 50 must be accompanied by an adult.    COVID SWAB TESTING MUST BE COMPLETED ON:  04/24/21 @ 9:45 AM    Come in [facility] Entrance. Have a seat in lobby to the right. Call [phone]. Tell them your name ans that you are here for a COVID test. You are not required to quarantine, however you are required to wear a well-fitted mask when you are out and around people not in your household.  Hand Hygiene often Do NOT share personal items Notify your provider if you are in close contact with someone who has COVID or you develop fever 100.4 or greater, new onset of sneezing, cough, sore throat, shortness of breath or body aches.       Your procedure is scheduled on: 04/28/21   Report to [facility] Main Entrance    Report to admitting at 5:15 AM   Call this number if you have problems the morning of surgery [phone]   Do not eat food :After Midnight.   May have liquids until 4:30 AM day of surgery  CLEAR LIQUID DIET  Foods Allowed                                                                     Foods Excluded  Water, Black Coffee and tea (no milk or creamer)           liquids that you cannot  Plain Jell-O in any flavor  (No red)                                    see through such as: Fruit ices (not with fruit pulp)                                             milk, soups, orange juice              Iced Popsicles (No red)                                                All solid food  Apple juices Sports drinks like Gatorade (No red) Lightly seasoned clear broth or consume(fat free) Sugar     The day of surgery:  Drink ONE (1) Pre-Surgery G2 by 4:30 am the morning of surgery. Drink in one sitting. Do not sip.  This drink was given to you during your hospital  pre-op appointment visit. Nothing else to drink after completing the  Pre-Surgery G2.          If you have questions, please contact your surgeons office.     Oral Hygiene is also important to reduce your risk of infection.                                    Remember - BRUSH YOUR TEETH THE MORNING OF SURGERY WITH YOUR REGULAR TOOTHPASTE   Take these medicines the morning of surgery with A SIP OF WATER: Tylenol, Bisoprolol, Inhalers, Wellbutrin, Dofetilide, Pepcid, Crestor  DO NOT TAKE ANY ORAL DIABETIC MEDICATIONS DAY OF YOUR SURGERY  How to Manage Your Diabetes Before and After Surgery  Why is it important to control my blood sugar before and after surgery? Improving blood sugar levels before and after surgery helps healing and can limit problems. A way of improving blood sugar control is eating a healthy diet by:  Eating less sugar and carbohydrates  Increasing activity/exercise  Talking with your doctor about reaching your blood sugar goals High blood sugars (greater than 180 mg/dL) can raise your risk of infections and slow your recovery, so you will need to focus on controlling your diabetes during the weeks before surgery. Make sure that the doctor who takes care of your diabetes knows about your planned surgery including the date and location.  How do I manage my blood sugar before surgery? Check your blood sugar at least 4 times a day, starting 2 days before surgery, to make sure that the level is not too high or  low. Check your blood sugar the morning of your surgery when you wake up and every 2 hours until you get to the Short Stay unit. If your blood sugar is less than 70 mg/dL, you will need to treat for low blood sugar: Do not take insulin. Treat a low blood sugar (less than 70 mg/dL) with  cup of clear juice (cranberry or apple), 4 glucose tablets, OR glucose gel. Recheck blood sugar in 15 minutes after treatment (to make sure it is greater than 70 mg/dL). If your blood sugar is not greater than 70 mg/dL on recheck, call [phone] for further instructions. Report your blood sugar to the short stay nurse when you get to Short Stay.  If you are admitted to the hospital after surgery: Your blood sugar will be checked by the staff and you will probably be given insulin after surgery (instead of oral diabetes medicines) to make sure you have good blood sugar levels. The goal for blood sugar control after surgery is 80-180 mg/dL.   WHAT DO I DO ABOUT MY DIABETES MEDICATION?  Do not take oral diabetes medicines (pills) the morning of surgery.  THE DAY BEFORE SURGERY, do not take [place]. Take Acarbose as prescribed day before surgery. Take 40 units of Tresiba.    THE MORNING OF SURGERY, do not take Fargixa or Acarbose. Take 20 units of Tyler Aas  Reviewed and Endorsed by [facility] Patient Education Committee, August 2015  You may not have any metal on your body including hair pins, jewelry, and body piercing             Do not wear make-up, lotions, powders, perfumes, or deodorant  Do not wear nail polish including gel and S&S, artificial/acrylic nails, or any other type of covering on natural nails including finger and toenails. If you have artificial nails, gel coating, etc. that needs to be removed by a nail salon please have this removed prior to surgery or surgery may need to be canceled/ delayed if the surgeon/ anesthesia feels like they are unable to be  safely monitored.   Do not shave  48 hours prior to surgery.    Do not bring valuables to the hospital. [facility] IS NOT             RESPONSIBLE   FOR VALUABLES.   Contacts, dentures or bridgework may not be worn into surgery.   Bring small overnight bag day of surgery              Please read over the following fact sheets you were given: IF YOU HAVE QUESTIONS ABOUT YOUR PRE-OP INSTRUCTIONS PLEASE CALL [phone]- [facility] - Preparing for Surgery Before surgery, you can play an important role.  Because skin is not sterile, your skin needs to be as free of germs as possible.  You can reduce the number of germs on your skin by washing with CHG (chlorahexidine gluconate) soap before surgery.  CHG is an antiseptic cleaner which kills germs and bonds with the skin to continue killing germs even after washing. Please DO NOT use if you have an allergy to CHG or antibacterial soaps.  If your skin becomes reddened/irritated stop using the CHG and inform your nurse when you arrive at Short Stay. Do not shave (including legs and underarms) for at least 48 hours prior to the first CHG shower.  You may shave your face/neck.  Please follow these instructions carefully:  1.  Shower with CHG Soap the night before surgery and the  morning of surgery.  2.  If you choose to wash your hair, wash your hair first as usual with your normal  shampoo.  3.  After you shampoo, rinse your hair and body thoroughly to remove the shampoo.                             4.  Use CHG as you would any other liquid soap.  You can apply chg directly to the skin and wash.  Gently with a scrungie or clean washcloth.  5.  Apply the CHG Soap to your body ONLY FROM THE NECK DOWN.   Do   not use on face/ open                           Wound or open sores. Avoid contact with eyes, ears mouth and   genitals (private parts).                       Wash face,  Genitals (private parts) with your normal soap.             6.   Wash thoroughly, paying special attention to the area where your    surgery  will be performed.  7.  Thoroughly rinse your body with warm water from the  neck down.  8.  DO NOT shower/wash with your normal soap after using and rinsing off the CHG Soap.                9.  Pat yourself dry with a clean towel.            10.  Wear clean pajamas.            11.  Place clean sheets on your bed the night of your first shower and do not  sleep with pets. Day of Surgery : Do not apply any lotions/deodorants the morning of surgery.  Please wear clean clothes to the hospital/surgery center.  FAILURE TO FOLLOW THESE INSTRUCTIONS MAY RESULT IN THE CANCELLATION OF YOUR SURGERY  PATIENT SIGNATURE_________________________________  NURSE SIGNATURE__________________________________  ________________________________________________________________________   [name]  An incentive spirometer is a tool that can help keep your lungs clear and active. This tool measures how well you are filling your lungs with each breath. Taking long deep breaths may help reverse or decrease the chance of developing breathing (pulmonary) problems (especially infection) following: A long period of time when you are unable to move or be active. BEFORE THE PROCEDURE  If the spirometer includes an indicator to show your best effort, your nurse or respiratory therapist will set it to a desired goal. If possible, sit up straight or lean slightly forward. Try not to slouch. Hold the incentive spirometer in an upright position. INSTRUCTIONS FOR USE  Sit on the edge of your bed if possible, or sit up as far as you can in bed or on a chair. Hold the incentive spirometer in an upright position. Breathe out normally. Place the mouthpiece in your mouth and seal your lips tightly around it. Breathe in slowly and as deeply as possible, raising the piston or the ball toward the top of the column. Hold your breath for 3-5  seconds or for as long as possible. Allow the piston or ball to fall to the bottom of the column. Remove the mouthpiece from your mouth and breathe out normally. Rest for a few seconds and repeat Steps 1 through 7 at least 10 times every 1-2 hours when you are awake. Take your time and take a few normal breaths between deep breaths. The spirometer may include an indicator to show your best effort. Use the indicator as a goal to work toward during each repetition. After each set of 10 deep breaths, practice coughing to be sure your lungs are clear. If you have an incision (the cut made at the time of surgery), support your incision when coughing by placing a pillow or rolled up towels firmly against it. Once you are able to get out of bed, walk around indoors and cough well. You may stop using the incentive spirometer when instructed by your caregiver.  RISKS AND COMPLICATIONS Take your time so you do not get dizzy or light-headed. If you are in pain, you may need to take or ask for pain medication before doing incentive spirometry. It is harder to take a deep breath if you are having pain. AFTER USE Rest and breathe slowly and easily. It can be helpful to keep track of a log of your progress. Your caregiver can provide you with a simple table to help with this. If you are using the spirometer at home, follow these instructions: [place] IF:  You are having difficultly using the spirometer. You have trouble using the spirometer as often as instructed.  Your pain medication is not giving enough relief while using the spirometer. You develop fever of 100.5 F (38.1 C) or higher. SEEK IMMEDIATE MEDICAL CARE IF:  You cough up bloody sputum that had not been present before. You develop fever of 102 F (38.9 C) or greater. You develop worsening pain at or near the incision site. MAKE SURE YOU:  Understand these instructions. Will watch your condition. Will get help right away if you are  not doing well or get worse. Document Released: 08/10/2006 Document Revised: 06/22/2011 Document Reviewed: 10/11/2006 Exit[employer] Patient Information 2014 [employer], [place].   ________________________________________________________________________  WHAT IS A BLOOD TRANSFUSION? Blood Transfusion Information  A transfusion is the replacement of blood or some of its parts. Blood is made up of multiple cells which provide different functions. Red blood cells carry oxygen and are used for blood loss replacement. White blood cells fight against infection. Platelets control bleeding. Plasma helps clot blood. Other blood products are available for specialized needs, such as hemophilia or other clotting disorders. BEFORE THE TRANSFUSION  Who gives blood for transfusions?  Healthy volunteers who are fully evaluated to make sure their blood is safe. This is blood bank blood. Transfusion therapy is the safest it has ever been in the practice of medicine. Before blood is taken from a donor, a complete history is taken to make sure that person has no history of diseases nor engages in risky social behavior (examples are intravenous drug use or sexual activity with multiple partners). The donor's travel history is screened to minimize risk of transmitting infections, such as malaria. The donated blood is tested for signs of infectious diseases, such as HIV and hepatitis. The blood is then tested to be sure it is compatible with you in order to minimize the chance of a transfusion reaction. If you or a relative donates blood, this is often done in anticipation of surgery and is not appropriate for emergency situations. It takes many days to process the donated blood. RISKS AND COMPLICATIONS Although transfusion therapy is very safe and saves many lives, the main dangers of transfusion include:  Getting an infectious disease. Developing a transfusion reaction. This is an allergic reaction to something in the blood  you were given. Every precaution is taken to prevent this. The decision to have a blood transfusion has been considered carefully by your caregiver before blood is given. Blood is not given unless the benefits outweigh the risks. AFTER THE TRANSFUSION Right after receiving a blood transfusion, you will usually feel much better and more energetic. This is especially true if your red blood cells have gotten low (anemic). The transfusion raises the level of the red blood cells which carry oxygen, and this usually causes an energy increase. The nurse administering the transfusion will monitor you carefully for complications. HOME CARE INSTRUCTIONS  No special instructions are needed after a transfusion. You may find your energy is better. Speak with your caregiver about any limitations on activity for underlying diseases you may have. SEEK MEDICAL CARE IF:  Your condition is not improving after your transfusion. You develop redness or irritation at the intravenous (IV) site. SEEK IMMEDIATE MEDICAL CARE IF:  Any of the following symptoms occur over the next 12 hours: Shaking chills. You have a temperature by mouth above 102 F (38.9 C), not controlled by medicine. Chest, back, or muscle pain. People around you feel you are not acting correctly or are confused. Shortness of breath or difficulty breathing. Dizziness and fainting. You get a  rash or develop hives. You have a decrease in urine output. Your urine turns a dark color or changes to pink, red, or brown. Any of the following symptoms occur over the next 10 days: You have a temperature by mouth above 102 F (38.9 C), not controlled by medicine. Shortness of breath. Weakness after normal activity. The white part of the eye turns yellow (jaundice). You have a decrease in the amount of urine or are urinating less often. Your urine turns a dark color or changes to pink, red, or brown. Document Released: 03/27/2000 Document Revised:  06/22/2011 Document Reviewed: 11/14/2007 [facility] Patient Information 2014 [place], [place].  _______________________________________________________________________

## 2021-04-14 ENCOUNTER — Other Ambulatory Visit: Payer: Self-pay | Admitting: Pulmonary Disease

## 2021-04-15 ENCOUNTER — Other Ambulatory Visit: Payer: Self-pay

## 2021-04-15 ENCOUNTER — Encounter (HOSPITAL_COMMUNITY): Payer: Self-pay

## 2021-04-15 ENCOUNTER — Encounter (HOSPITAL_COMMUNITY)
Admission: RE | Admit: 2021-04-15 | Discharge: 2021-04-15 | Disposition: A | Discharge status: Home / Self Care | Payer: Medicare HMO | Source: Ambulatory Visit | Attending: Orthopedic Surgery | Admitting: Orthopedic Surgery

## 2021-04-15 ENCOUNTER — Ambulatory Visit (INDEPENDENT_AMBULATORY_CARE_PROVIDER_SITE_OTHER): Payer: Medicare HMO

## 2021-04-15 VITALS — BP 138/66 | HR 60 | Temp 98.0°F | Resp 18 | Ht 66.0 in | Wt 182.0 lb

## 2021-04-15 DIAGNOSIS — Z01812 Encounter for preprocedural laboratory examination: Secondary | ICD-10-CM | POA: Insufficient documentation

## 2021-04-15 DIAGNOSIS — G4733 Obstructive sleep apnea (adult) (pediatric): Secondary | ICD-10-CM | POA: Insufficient documentation

## 2021-04-15 DIAGNOSIS — Z95810 Presence of automatic (implantable) cardiac defibrillator: Secondary | ICD-10-CM

## 2021-04-15 DIAGNOSIS — I5032 Chronic diastolic (congestive) heart failure: Secondary | ICD-10-CM

## 2021-04-15 DIAGNOSIS — Z7901 Long term (current) use of anticoagulants: Secondary | ICD-10-CM | POA: Diagnosis not present

## 2021-04-15 DIAGNOSIS — M1711 Unilateral primary osteoarthritis, right knee: Secondary | ICD-10-CM | POA: Diagnosis not present

## 2021-04-15 DIAGNOSIS — N182 Chronic kidney disease, stage 2 (mild): Secondary | ICD-10-CM | POA: Diagnosis not present

## 2021-04-15 DIAGNOSIS — E669 Obesity, unspecified: Secondary | ICD-10-CM | POA: Insufficient documentation

## 2021-04-15 DIAGNOSIS — I480 Paroxysmal atrial fibrillation: Secondary | ICD-10-CM | POA: Diagnosis not present

## 2021-04-15 DIAGNOSIS — I130 Hypertensive heart and chronic kidney disease with heart failure and stage 1 through stage 4 chronic kidney disease, or unspecified chronic kidney disease: Secondary | ICD-10-CM | POA: Diagnosis not present

## 2021-04-15 DIAGNOSIS — G8929 Other chronic pain: Secondary | ICD-10-CM | POA: Diagnosis not present

## 2021-04-15 DIAGNOSIS — M25561 Pain in right knee: Secondary | ICD-10-CM | POA: Insufficient documentation

## 2021-04-15 DIAGNOSIS — Z01818 Encounter for other preprocedural examination: Secondary | ICD-10-CM

## 2021-04-15 DIAGNOSIS — J4540 Moderate persistent asthma, uncomplicated: Secondary | ICD-10-CM | POA: Diagnosis not present

## 2021-04-15 DIAGNOSIS — E1122 Type 2 diabetes mellitus with diabetic chronic kidney disease: Secondary | ICD-10-CM | POA: Diagnosis not present

## 2021-04-15 DIAGNOSIS — Z794 Long term (current) use of insulin: Secondary | ICD-10-CM

## 2021-04-15 HISTORY — DX: Personal history of urinary calculi: Z87.442

## 2021-04-15 LAB — COMPREHENSIVE METABOLIC PANEL
ALT: 59 U/L — ABNORMAL HIGH (ref 0–44)
AST: 37 U/L (ref 15–41)
Albumin: 3.5 g/dL (ref 3.5–5.0)
Alkaline Phosphatase: 86 U/L (ref 38–126)
Anion gap: 6 (ref 5–15)
BUN: 21 mg/dL (ref 8–23)
CO2: 26 mmol/L (ref 22–32)
Calcium: 8.7 mg/dL — ABNORMAL LOW (ref 8.9–10.3)
Chloride: 106 mmol/L (ref 98–111)
Creatinine, Ser: 1.26 mg/dL — ABNORMAL HIGH (ref 0.44–1.00)
GFR, Estimated: 46 mL/min — ABNORMAL LOW (ref >60)
Glucose, Bld: 122 mg/dL — ABNORMAL HIGH (ref 70–99)
Potassium: 4.6 mmol/L (ref 3.5–5.1)
Sodium: 138 mmol/L (ref 135–145)
Total Bilirubin: 0.8 mg/dL (ref 0.3–1.2)
Total Protein: 7.5 g/dL (ref 6.5–8.1)

## 2021-04-15 LAB — HEMOGLOBIN A1C
Hgb A1c MFr Bld: 7.4 % — ABNORMAL HIGH (ref 4.8–5.6)
Mean Plasma Glucose: 165.68 mg/dL

## 2021-04-15 LAB — TYPE AND SCREEN
ABO/RH(D): B POS
Antibody Screen: NEGATIVE

## 2021-04-15 LAB — GLUCOSE, CAPILLARY: Glucose-Capillary: 138 mg/dL — ABNORMAL HIGH (ref 70–99)

## 2021-04-15 LAB — CBC WITH DIFFERENTIAL/PLATELET
Abs Immature Granulocytes: 0.01 10*3/uL (ref 0.00–0.07)
Basophils Absolute: 0 10*3/uL (ref 0.0–0.1)
Basophils Relative: 0 %
Eosinophils Absolute: 0.2 10*3/uL (ref 0.0–0.5)
Eosinophils Relative: 5 %
HCT: 43 % (ref 36.0–46.0)
Hemoglobin: 13.6 g/dL (ref 12.0–15.0)
Immature Granulocytes: 0 %
Lymphocytes Relative: 40 %
Lymphs Abs: 1.9 10*3/uL (ref 0.7–4.0)
MCH: 29.3 pg (ref 26.0–34.0)
MCHC: 31.6 g/dL (ref 30.0–36.0)
MCV: 92.7 fL (ref 80.0–100.0)
Monocytes Absolute: 0.5 10*3/uL (ref 0.1–1.0)
Monocytes Relative: 11 %
Neutro Abs: 2.1 10*3/uL (ref 1.7–7.7)
Neutrophils Relative %: 44 %
Platelets: 203 10*3/uL (ref 150–400)
RBC: 4.64 MIL/uL (ref 3.87–5.11)
RDW: 13.9 % (ref 11.5–15.5)
WBC: 4.8 10*3/uL (ref 4.0–10.5)
nRBC: 0 % (ref 0.0–0.2)

## 2021-04-15 LAB — SURGICAL PCR SCREEN
MRSA, PCR: NEGATIVE
Staphylococcus aureus: NEGATIVE

## 2021-04-15 LAB — PROTIME-INR
INR: 1.4 — ABNORMAL HIGH (ref 0.8–1.2)
Prothrombin Time: 16.9 seconds — ABNORMAL HIGH (ref 11.4–15.2)

## 2021-04-15 LAB — APTT: aPTT: 25 seconds (ref 24–36)

## 2021-04-16 NOTE — Anesthesia Preprocedure Evaluation (Addendum)
Anesthesia Evaluation  Patient identified by MRN, date of birth, ID band Patient awake    Reviewed: Allergy & Precautions, NPO status , Patient's Chart, lab work & pertinent test results, reviewed documented beta blocker date and time   History of Anesthesia Complications (+) PROLONGED EMERGENCE and history of anesthetic complications  Airway Mallampati: II  TM Distance: >3 FB Neck ROM: Full    Dental  (+) Dental Advisory Given, Partial Lower   Pulmonary asthma , sleep apnea and Continuous Positive Airway Pressure Ventilation ,    Pulmonary exam normal        Cardiovascular hypertension, Pt. on medications and Pt. on home beta blockers +CHF  Normal cardiovascular exam+ dysrhythmias Atrial Fibrillation + pacemaker + Cardiac Defibrillator    '20 TTE - EF 55-60%. Mild asymmetric left ventricular hypertrophy. Left ventricular diastolic Doppler parameters are consistent with impaired relaxation. Left atrial size was mildly dilated. Aortic valve regurgitation is trivial by color flow Doppler.     Neuro/Psych PSYCHIATRIC DISORDERS Anxiety Depression negative neurological ROS     GI/Hepatic Neg liver ROS, hiatal hernia, GERD  Medicated and Controlled,  Endo/Other  diabetes, Insulin Dependent  Renal/GU CRFRenal disease     Musculoskeletal  (+) Arthritis , Osteoarthritis,    Abdominal   Peds  Hematology  On elqiuis    Anesthesia Other Findings   Reproductive/Obstetrics                           Anesthesia Physical Anesthesia Plan  ASA: 3  Anesthesia Plan: Spinal   Post-op Pain Management: Tylenol PO (pre-op) and Regional block   Induction:   PONV Risk Score and Plan: 2 and Treatment may vary due to age or medical condition and Propofol infusion  Airway Management Planned: Natural Airway and Simple Face Mask  Additional Equipment: None  Intra-op Plan:   Post-operative Plan:   Informed  Consent: I have reviewed the patients History and Physical, chart, labs and discussed the procedure including the risks, benefits and alternatives for the proposed anesthesia with the patient or authorized representative who has indicated his/her understanding and acceptance.       Plan Discussed with: CRNA and Anesthesiologist  Anesthesia Plan Comments:       Anesthesia Quick Evaluation

## 2021-04-16 NOTE — Progress Notes (Signed)
Anesthesia Chart Review   Case: [id number] Date/Time: 04/28/21 0715   Procedure: TOTAL KNEE ARTHROPLASTY (Right: Knee)   Anesthesia type: Spinal   Pre-op diagnosis: OA RIGHT KNEE   Location: [facility] ROOM 07 / [facility]   Surgeons: Willaim Sheng, MD       DISCUSSION:70 y.o. never smoker with h/o HTN, moderate persistent asthma, atrial fibrillation (on Eliquis), ICD in place (device orders in 04/11/2021 progress note), CHF, OSA on CPAP, DM II, CKD Stage II, right knee OA scheduled for above procedure 04/28/2021 with Dr. Charlies Constable.   Pt seen by pulmonology 02/28/2021 for preoperative evaluation.  Per OV note, "Encounter for pre-operative respiratory clearance CXR today for pre-operative evaluation. Spirometry today to assess lung function prior to surgery and improved from prior exam. Patient moderate surgical risk given history of OSA, asthma, obesity, and age, although morbidities well-controlled. Ensure aggressive pulmonary toileting post-operatively.    Risk ameliorating factors are OSA, asthma, and obesity.  Major Pulmonary risks identified in the multifactorial risk analysis are but not limited to a) pneumonia; b) recurrent intubation risk; c) prolonged or recurrent acute respiratory failure needing mechanical ventilation; d) prolonged hospitalization; e) DVT/Pulmonary embolism; f) Acute Pulmonary edema   Recommend 1. Short duration of surgery as much as possible and avoid paralytic if possible 2. Recovery in step down or ICU with Pulmonary consultation 3. DVT prophylaxis 4. Aggressive pulmonary toilet with o2, bronchodilatation, and incentive spirometry and early ambulation"  Per cardiology preoperative evaluation 02/20/21, "Chart reviewed as part of pre-operative protocol coverage. Patient was contacted 02/20/2021 in reference to pre-operative risk assessment for pending surgery as outlined below.  [name] was last seen on 10/22/20 by Dr. Sallyanne Kuster.  Since that day, [name] has done well.   Therefore, based on ACC/AHA guidelines, the patient would be at acceptable risk for the planned procedure without further cardiovascular testing."  Pt advised to hold Eliquis 3 days prior to surgery.   Anticipate pt can proceed with planned procedure barring acute status change.   VS: BP 138/66    Pulse 60    Temp 36.7 C (Oral)    Resp 18    Ht 5\' 6"  (1.676 m)    Wt 82.6 kg    LMP 07/26/2011    SpO2 100%    BMI 29.38 kg/m   PROVIDERS: Alcus Dad, MD is PCP   Cardiologist - Sanda Klein, MD LABS: Labs reviewed: Acceptable for surgery. (all labs ordered are listed, but only abnormal results are displayed)  Labs Reviewed  COMPREHENSIVE METABOLIC PANEL - Abnormal; Notable for the following components:      Result Value   Glucose, Bld 122 (*)    Creatinine, Ser 1.26 (*)    Calcium 8.7 (*)    ALT 59 (*)    GFR, Estimated 46 (*)    All other components within normal limits  PROTIME-INR - Abnormal; Notable for the following components:   Prothrombin Time 16.9 (*)    INR 1.4 (*)    All other components within normal limits  HEMOGLOBIN A1C - Abnormal; Notable for the following components:   Hgb A1c MFr Bld 7.4 (*)    All other components within normal limits  GLUCOSE, CAPILLARY - Abnormal; Notable for the following components:   Glucose-Capillary 138 (*)    All other components within normal limits  SURGICAL PCR SCREEN  CBC WITH DIFFERENTIAL/PLATELET  APTT  TYPE AND SCREEN     IMAGES:   EKG: 10/22/2020 Rate 75  bpm  Atrial-paced rhythm with prolonged AV conduction   CV: Echo 09/19/2018 1. The left ventricle has normal systolic function, with an ejection  fraction of 55-60%. The cavity size was normal. There is mild asymmetric  left ventricular hypertrophy. Left ventricular diastolic Doppler  parameters are consistent with impaired  relaxation. No evidence of left ventricular regional wall motion  abnormalities.   2. The average left  ventricular global longitudinal strain is 14.9 %.   3. The right ventricle has normal systolic function. The cavity was  normal. There is no increase in right ventricular wall thickness.   4. Left atrial size was mildly dilated.   5. The aortic valve is tricuspid. Mild sclerosis of the aortic valve.  Aortic valve regurgitation is trivial by color flow Doppler.  Past Medical History:  Diagnosis Date   AICD (automatic cardioverter/defibrillator) present    Anemia    Anxiety    Arthritis    Arthritis of carpometacarpal (CMC) joint of thumb 03/26/2016   Asthma    has had multiple hospitalizations for this   Asthma, moderate persistent 12/10/2011   Atrial fibrillation ([place])    ablation x 2 [facility], 01/2006, 2011.  on warfarin   Cardiac arrest ([place]) 04/2010   in hospital for pneumonia when this occured- occured at the hospital   CHF (congestive heart failure) ([place]) 2012   Echo 08/08/10 by [facility]. EF 35-45%. LV systolic function moderately reduced. Moderate global hypokinesis of LV.  RV systolic function moderately reduced. Mild MR. Trace AR.   Chronic diastolic heart failure ([place]) 01/07/2018   CKD (chronic kidney disease) stage 2, GFR 60-89 ml/min 02/22/2012   Her cr range 1.2-1.5 since Jan 2012 after hospitalization. 10/13 Estimated Creatinine Clearance: 69.3 ml/min (by C-G formula based on Cr of 1).     Complication of anesthesia    difficult time waking up after anesthesia   Depression    Diabetes mellitus    Type 2   Diabetic neuropathy ([place]) 09/18/2011   GASTROESOPHAGEAL REFLUX, NO ESOPHAGITIS 06/10/2006   Qualifier: Diagnosis of  By: Eusebio Friendly     GERD (gastroesophageal reflux disease) 03/23/2018   History of hiatal hernia    History of kidney stones    HYPERCHOLESTEROLEMIA 06/10/2006   LDL 93 at 01/2011 check-  Continue pravastatin 20mg  po daily.  --discuss health modifications and possibly increaseing dose at next appt.  Cardiology- Dr little- stopped pravachol  on 05/21/11- 2/2 allergies?     Hyperlipidemia    Hypertension    ICD (St. Jude Protecta dual-chamber),secondary prevention (VF arrest) January 2012 11/19/2012   Iliotibial band syndrome, left 11/01/2018   Limb pain 06/04/2008   LLE, Baker's cyst in popliteal fossa, no DVT   Long term current use of anticoagulant therapy 08/21/2015   Non-ischemic cardiomyopathy ([place])    echo 08/08/10 - EF [id number] LV and RV systolic function mod reduced   Obesity (BMI 30-39.9) 01/04/2014   OSA on CPAP 06/10/2006   Sleep study 02/2014 : severe apnea, corrected with nasal pillows and 8cmh2o     Osteoarthritis of right knee 08/28/2008   Qualifier: Diagnosis of  By: Barbaraann Barthel MD, Audelia Acton     Primary localized osteoarthritis of right knee    Sleep apnea    wears CPAP nightly   Type 2 diabetes mellitus with diabetic neuropathy, with long-term current use of insulin ([place]) 06/10/2006              Ventricular tachycardia 05/22/2015   Visit for monitoring  Tikosyn therapy 08/21/2015   Vocal cord disease     Past Surgical History:  Procedure Laterality Date   BREAST EXCISIONAL BIOPSY Right 1999   BREAST LUMPECTOMY Right    CARDIAC DEFIBRILLATOR PLACEMENT  05/02/10   Medtronic Protecta XT-DR for CHF-VT, last download 04/12/12   CHOLECYSTECTOMY     COLONOSCOPY     HAMMER TOE SURGERY Right    ICD GENERATOR CHANGEOUT N/A 09/28/2017   Procedure: ICD GENERATOR CHANGEOUT;  Surgeon: Sanda Klein, MD;  Location: [facility];  Service: Cardiovascular;  Laterality: N/A;   KNEE ARTHROSCOPY Right    PAF Ablation     By Dr Ola Spurr. Now sees Dr Tilden Dome at [place] Left 09/17/2014   Procedure: TOTAL KNEE ARTHROPLASTY;  Surgeon: Elsie Saas, MD;  Location: [facility];  Service: Orthopedics;  Laterality: Left;  pt has ICD   TUBAL LIGATION      MEDICATIONS:  acarbose (PRECOSE) 50 MG tablet   acetaminophen (TYLENOL 8 HOUR) 650 MG CR tablet   apixaban (ELIQUIS) 5 MG TABS tablet   bisoprolol  (ZEBETA) 10 MG tablet   budesonide-formoterol (SYMBICORT) 80-4.5 MCG/ACT inhaler   buPROPion (WELLBUTRIN XL) 150 MG 24 hr tablet   Continuous Blood Gluc Receiver (FREESTYLE LIBRE 2 READER) DEVI   Continuous Blood Gluc Sensor (FREESTYLE LIBRE 2 SENSOR) MISC   dapagliflozin propanediol (FARXIGA) 10 MG TABS tablet   diclofenac Sodium (VOLTAREN) 1 % GEL   dofetilide (TIKOSYN) 250 MCG capsule   famotidine (PEPCID) 20 MG tablet   fluticasone (FLONASE) 50 MCG/ACT nasal spray   FLUZONE HIGH-DOSE QUADRIVALENT 0.7 ML SUSY   glucose blood test strip   insulin degludec (TRESIBA) 100 UNIT/ML FlexTouch Pen   Insulin Pen Needle 32G X 4 MM MISC   levalbuterol (XOPENEX HFA) 45 MCG/ACT inhaler   levalbuterol (XOPENEX) 0.63 MG/3ML nebulizer solution   montelukast (SINGULAIR) 10 MG tablet   polyethylene glycol powder (GLYCOLAX/MIRALAX) 17 GM/SCOOP powder   potassium chloride SA (KLOR-CON M) 20 MEQ tablet   PRODIGY LANCETS 28G MISC   rosuvastatin (CRESTOR) 10 MG tablet   Semaglutide, 2 MG/DOSE, (OZEMPIC, 2 MG/DOSE,) 8 MG/3ML SOPN   SHINGRIX injection   torsemide (DEMADEX) 20 MG tablet   No current facility-administered medications for this encounter.    Konrad Felix Ward, PA-C [facility] [phone]

## 2021-04-16 NOTE — Progress Notes (Signed)
EPIC Encounter for ICM Monitoring  Patient Name: CHANIE EGLE is a 71 y.o. female Date: 04/16/2021 Primary Care Physican: Alcus Dad, MD Primary Cardiologist: Cro[place]u Electrophysiologist: Croitoru 03/12/2021 Weight: 179-180 lbs 03/26/2021 Office Weight: 184 lbs   Clinical Status (12-Mar-2021 to 15-Apr-2021) AT/AF 9 Time in AT/AF 0.2 hr/day (0.7%) Longest AT/AF 113 minutes                                       Spoke with patient and heart failure questions reviewed.  Pt asymptomatic for fluid accumulation and feeling well.   She is scheduled for TKR 1/16.   Optivol thoracic impedance suggesting normal fluid levels.   Prescribed dosage:  Torsemide 20 mg 1 tablet daily.   Potassium 20 mEq 1 tablet by mouth as directed.   Labs: 04/15/2021 Creatinine 1.26, BUN 21, Potassium 4.6, Sodium 138, GFR 46 09/19/2020 Creatinine 1.31, BUN 23, Potassium 4.4, Sodium 142, GFR 44 08/30/2020 Creatinine 1.32, BUN 18, Potassium 4.1, Sodium 140, GFR 44 07/23/2020 Creatinine 1.11, BUN 17, Potassium 4.1, Sodium 139, GFR 54 07/11/2020 Creatinine 1.31, BUN 16, Potassium 4.8, Sodium 139, GFR 44 06/04/2020 Creatinine 1.28, BUN 21, Potassium 4.3, Sodium 141  A complete set of results can be found in Results Review.   Recommendations:  Recommendation to limit fluid intake to 64 oz daily.  Encouraged to call if experiencing any fluid symptoms.   Follow-up plan: ICM clinic phone appointment on 05/19/2021.   91 day device clinic remote transmission 05/21/2021.     Next office visit scheduled:  04/21/2021 for Dr Sallyanne Kuster.     Copy of ICM check sent to Dr. Sallyanne Kuster.      3 month ICM trend: 04/15/2021.    12-14 Month ICM trend:     [name], RN 04/16/2021 4:31 PM

## 2021-04-21 ENCOUNTER — Ambulatory Visit (INDEPENDENT_AMBULATORY_CARE_PROVIDER_SITE_OTHER): Payer: Medicare HMO | Admitting: Cardiovascular Disease

## 2021-04-21 ENCOUNTER — Other Ambulatory Visit: Payer: Self-pay

## 2021-04-21 ENCOUNTER — Encounter: Payer: Self-pay | Admitting: Cardiovascular Disease

## 2021-04-21 VITALS — BP 110/62 | Ht 66.0 in | Wt 184.0 lb

## 2021-04-21 DIAGNOSIS — G4733 Obstructive sleep apnea (adult) (pediatric): Secondary | ICD-10-CM | POA: Diagnosis not present

## 2021-04-21 DIAGNOSIS — E7800 Pure hypercholesterolemia, unspecified: Secondary | ICD-10-CM | POA: Diagnosis not present

## 2021-04-21 DIAGNOSIS — E1140 Type 2 diabetes mellitus with diabetic neuropathy, unspecified: Secondary | ICD-10-CM

## 2021-04-21 DIAGNOSIS — Z794 Long term (current) use of insulin: Secondary | ICD-10-CM

## 2021-04-21 DIAGNOSIS — J4540 Moderate persistent asthma, uncomplicated: Secondary | ICD-10-CM

## 2021-04-21 DIAGNOSIS — I10 Essential (primary) hypertension: Secondary | ICD-10-CM

## 2021-04-21 DIAGNOSIS — E663 Overweight: Secondary | ICD-10-CM

## 2021-04-21 DIAGNOSIS — I472 Ventricular tachycardia, unspecified: Secondary | ICD-10-CM | POA: Diagnosis not present

## 2021-04-21 DIAGNOSIS — D6869 Other thrombophilia: Secondary | ICD-10-CM

## 2021-04-21 DIAGNOSIS — I480 Paroxysmal atrial fibrillation: Secondary | ICD-10-CM

## 2021-04-21 DIAGNOSIS — I5032 Chronic diastolic (congestive) heart failure: Secondary | ICD-10-CM

## 2021-04-21 DIAGNOSIS — N1831 Chronic kidney disease, stage 3a: Secondary | ICD-10-CM

## 2021-04-21 DIAGNOSIS — Z95810 Presence of automatic (implantable) cardiac defibrillator: Secondary | ICD-10-CM

## 2021-04-21 DIAGNOSIS — Z5181 Encounter for therapeutic drug level monitoring: Secondary | ICD-10-CM

## 2021-04-21 DIAGNOSIS — Z79899 Other long term (current) drug therapy: Secondary | ICD-10-CM

## 2021-04-21 NOTE — Progress Notes (Signed)
Patient ID: [name], female   DOB: [birth year], 72 y.o.   MRN: [id number] Patient ID: [name], female   DOB: 01-Oct-1950, 71 y.o.   MRN: [id number]    Cardiology Office Note    Date:  04/24/2021   ID:  [name], DOB [birth year], MRN [id number]  PCP:  Alcus Dad, MD  Cardiologist:   Sanda Klein, MD   Chief Complaint  Patient presents with   Congestive Heart Failure   Atrial Fibrillation    History of Present Illness:  [name] is a 71 y.o. female with a long-standing history of nonischemic cardiomyopathy, chronic systolic heart failure and paroxysmal atrial fibrillation, s/p ICD (Medtronic, gen change 2019).    The patient specifically denies any chest pain at rest exertion, dyspnea at rest or with exertion, orthopnea, paroxysmal nocturnal dyspnea, syncope, palpitations, focal neurological deficits, intermittent claudication, lower extremity edema, unexplained weight gain, cough, hemoptysis or wheezing.  Continues to have a lot of problems with right knee pain and is scheduled for knee surgery on January 16.  She has not had any falls or bleeding problems on Eliquis.  She reports compliance with CPAP.  She denies daytime hypersomnolence.  She has very determined [place] she has been very focused in efforts to improve glycemic control and lose weight.  She is no longer obese and has slipped down into overweight range.  Her most recent hemoglobin A1c was 6.9% (this was a prerequisite for her knee surgery).  She also has a very good lipid profile with LDL cholesterol 49 and HDL of 46.  Her creatinine is mildly elevated at 126-1.32 (baseline).  Presenting rhythm today is atrial paced, ventricular sensed and she has 96% atrial pacing (with good heart rate histogram distribution) and only 0.6% ventricular pacing.  The overall burden of atrial fibrillation is about 1%.  She is never aware of the atrial arrhythmia.  For the most part ventricular rates are well controlled with rare  RVR.  The longest episode was only 3 hours in duration.  Lead and generator parameters remain good.  Her current generator was implanted in 2019 and has approximately 4.8 years of remaining longevity.  She has not had any recent VT.  She had antitachycardia pacing for ventricular tachycardia in December 2016 and again in April 2022. Since then a handful of episodes of nonsustained VT have been recorded, none meeting criteria for treatment. On July 19, 2020 she had very fast VT (CL 280 ms), mostly monomorphic. ATP corrected it for one beat, but then it recurred. A second burst of ATP stopped the arrhythmia. The episode was mildly symptomatic. No recurrence since. Labs were normal.  She is compliant with CPAP for obstructive sleep apnea.  She has a history of moderate nonischemic cardiomyopathy (no coronary disease by cardiac catheterization January 2012), recurrent paroxysmal atrial fibrillation status post 2 ablation procedures (Dr. Koleen Nimrod), with history of ventricular fibrillation arrest while on treatment with diltiazem and dofetilide and a prolonged QT interval, probably related to simultaneous quinolone therapy. She has a dual-chamber Medtronic Protecta defibrillator implanted in January 2012. In December 2016 her defibrillator recorded an episode of sustained monomorphic ventricular tachycardia. Antitachycardia pacing was unsuccessful at converting the arrhythmia, but she had a "dirty break" before defibrillator shock was delivered. The shock was aborted. Polymorphic VT lasting 2 seconds was recorded in the fall of 2017 during asthma exacerbation, but did not require intervention. Had sustained monomorphic VT on July 19, 2020. ATP successfully terminated the arrhythmia.  Most recent echo June 2020 showed normal LVEF 55-60%, but reduced GLS -14%. At LV angiography in 2012, EF was 15-20%.  Past Medical History:  Diagnosis Date   AICD (automatic cardioverter/defibrillator) present    Anemia     Anxiety    Arthritis    Arthritis of carpometacarpal (CMC) joint of thumb 03/26/2016   Asthma    has had multiple hospitalizations for this   Asthma, moderate persistent 12/10/2011   Atrial fibrillation ([place])    ablation x 2 [facility], 01/2006, 2011.  on warfarin   Cardiac arrest ([place]) 04/2010   in hospital for pneumonia when this occured- occured at the hospital   CHF (congestive heart failure) ([place]) 2012   Echo 08/08/10 by [facility]. EF 35-45%. LV systolic function moderately reduced. Moderate global hypokinesis of LV.  RV systolic function moderately reduced. Mild MR. Trace AR.   Chronic diastolic heart failure ([place]) 01/07/2018   CKD (chronic kidney disease) stage 2, GFR 60-89 ml/min 02/22/2012   Her cr range 1.2-1.5 since Jan 2012 after hospitalization. 10/13 Estimated Creatinine Clearance: 69.3 ml/min (by C-G formula based on Cr of 1).     Complication of anesthesia    difficult time waking up after anesthesia   Depression    Diabetes mellitus    Type 2   Diabetic neuropathy ([place]) 09/18/2011   GASTROESOPHAGEAL REFLUX, NO ESOPHAGITIS 06/10/2006   Qualifier: Diagnosis of  By: Eusebio Friendly     GERD (gastroesophageal reflux disease) 03/23/2018   History of hiatal hernia    History of kidney stones    HYPERCHOLESTEROLEMIA 06/10/2006   LDL 93 at 01/2011 check-  Continue pravastatin 20mg  po daily.  --discuss health modifications and possibly increaseing dose at next appt.  Cardiology- Dr little- stopped pravachol on 05/21/11- 2/2 allergies?     Hyperlipidemia    Hypertension    ICD (St. Jude Protecta dual-chamber),secondary prevention (VF arrest) January 2012 11/19/2012   Iliotibial band syndrome, left 11/01/2018   Limb pain 06/04/2008   LLE, Baker's cyst in popliteal fossa, no DVT   Long term current use of anticoagulant therapy 08/21/2015   Non-ischemic cardiomyopathy ([place])    echo 08/08/10 - EF [id number] LV and RV systolic function mod reduced   Obesity (BMI 30-39.9)  01/04/2014   OSA on CPAP 06/10/2006   Sleep study 02/2014 : severe apnea, corrected with nasal pillows and 8cmh2o     Osteoarthritis of right knee 08/28/2008   Qualifier: Diagnosis of  By: Barbaraann Barthel MD, Audelia Acton     Primary localized osteoarthritis of right knee    Sleep apnea    wears CPAP nightly   Type 2 diabetes mellitus with diabetic neuropathy, with long-term current use of insulin ([place]) 06/10/2006              Ventricular tachycardia 05/22/2015   Visit for monitoring Tikosyn therapy 08/21/2015   Vocal cord disease     Past Surgical History:  Procedure Laterality Date   BREAST EXCISIONAL BIOPSY Right 1999   BREAST LUMPECTOMY Right    CARDIAC DEFIBRILLATOR PLACEMENT  05/02/10   Medtronic Protecta XT-DR for CHF-VT, last download 04/12/12   CHOLECYSTECTOMY     COLONOSCOPY     HAMMER TOE SURGERY Right    ICD GENERATOR CHANGEOUT N/A 09/28/2017   Procedure: ICD [place];  Surgeon: Sanda Klein, MD;  Location: [facility];  Service: Cardiovascular;  Laterality: N/A;   KNEE ARTHROSCOPY Right    PAF Ablation     By Dr  Fitzgerald. Now sees Dr Tilden Dome at [facility] Left 09/17/2014   Procedure: TOTAL KNEE ARTHROPLASTY;  Surgeon: Elsie Saas, MD;  Location: [facility];  Service: Orthopedics;  Laterality: Left;  pt has ICD   TUBAL LIGATION      Outpatient Medications Prior to Visit  Medication Sig Dispense Refill   acarbose (PRECOSE) 50 MG tablet TAKE 1 TABLET(50 MG) BY MOUTH THREE TIMES DAILY WITH MEALS 270 tablet 0   acetaminophen (TYLENOL 8 HOUR) 650 MG CR tablet Take 1 tablet (650 mg total) by mouth every 8 (eight) hours as needed for pain. 90 tablet 2   apixaban (ELIQUIS) 5 MG TABS tablet TAKE 1 TABLET(5 MG) BY MOUTH TWICE DAILY 180 tablet 1   bisoprolol (ZEBETA) 10 MG tablet Take 1 tablet (10 mg total) by mouth in the morning and at bedtime. 180 tablet 1   budesonide-formoterol (SYMBICORT) 80-4.5 MCG/ACT inhaler INHALE 2 PUFFS INTO THE LUNGS IN THE  MORNING AND AT BEDTIME 10.2 g 11   buPROPion (WELLBUTRIN XL) 150 MG 24 hr tablet Take 1 tablet (150 mg total) by mouth daily. 90 tablet 3   dapagliflozin propanediol (FARXIGA) 10 MG TABS tablet Take 1 tablet (10 mg total) by mouth daily. 90 tablet 0   diclofenac Sodium (VOLTAREN) 1 % GEL Apply 2 g topically 4 (four) times daily. (Patient taking differently: Apply 2 g topically daily as needed (pain).) 100 g 1   dofetilide (TIKOSYN) 250 MCG capsule TAKE 1 CAPSULE(250 MCG) BY MOUTH TWICE DAILY 180 capsule 3   famotidine (PEPCID) 20 MG tablet Take 1 tablet (20 mg total) by mouth daily. 90 tablet 0   glucose blood test strip Use as instructed 100 each 12   insulin degludec (TRESIBA) 100 UNIT/ML FlexTouch Pen Inject 40 Units into the skin daily. 15 mL 2   Insulin Pen Needle 32G X 4 MM MISC Use as directed with insulin once daily 100 each 0   montelukast (SINGULAIR) 10 MG tablet TAKE 1 TABLET BY MOUTH AT BEDTIME 90 tablet 0   potassium chloride SA (KLOR-CON M) 20 MEQ tablet Take 1 tablet by mouth daily as directed. 30 tablet 10   PRODIGY LANCETS 28G MISC 1 Units by Does not apply route 4 (four) times daily. 100 each 4   rosuvastatin (CRESTOR) 10 MG tablet TAKE 1 TABLET BY MOUTH DAILY 90 tablet 2   Semaglutide, 2 MG/DOSE, (OZEMPIC, 2 MG/DOSE,) 8 MG/3ML SOPN INJECT 2MG  UNDER THE SKIN WEEKLY 3 mL 2   torsemide (DEMADEX) 20 MG tablet Take 1 tablet (20 mg total) by mouth daily. 90 tablet 2   Continuous Blood Gluc Receiver (FREESTYLE LIBRE 2 READER) DEVI Use as directed (Patient not taking: Reported on 04/21/2021) 1 each 0   Continuous Blood Gluc Sensor (FREESTYLE LIBRE 2 SENSOR) MISC Use as directed. Replace every 14 days (Patient not taking: Reported on 04/21/2021)     fluticasone (FLONASE) 50 MCG/ACT nasal spray instill 1 spray into each nostril twice a day (Patient not taking: Reported on 04/21/2021) 16 g 5   FLUZONE HIGH-DOSE QUADRIVALENT 0.7 ML SUSY  (Patient not taking: Reported on 03/27/2021)      levalbuterol (XOPENEX HFA) 45 MCG/ACT inhaler INHALE 1 TO 2 PUFFS BY MOUTH INTO THE LUNGS EVERY 4 HOURS IF NEEDED FOR WHEEZING (Patient not taking: Reported on 04/21/2021) 1 Inhaler 1   levalbuterol (XOPENEX) 0.63 MG/3ML nebulizer solution One vial in nebulizer four times daily as needed (Patient not taking: Reported on 04/21/2021) 360 mL  5   polyethylene glycol powder (GLYCOLAX/MIRALAX) 17 GM/SCOOP powder take 17GM (DISSOLVED IN WATER) by mouth once daily THREE TIMES A WEEK if needed as directed (Patient not taking: Reported on 04/03/2021) 500 g 3   SHINGRIX injection  (Patient not taking: Reported on 03/27/2021)     No facility-administered medications prior to visit.     Allergies:   Avelox [moxifloxacin hcl in nacl], Nsaids, Simvastatin, Ace inhibitors, Jardiance [empagliflozin], and Latex   Social History   Socioeconomic History   Marital status: Widowed    Spouse name: Not on file   Number of children: Not on file   Years of education: Not on file   Highest education level: Not on file  Occupational History   Not on file  Tobacco Use   Smoking status: Never   Smokeless tobacco: Never  Vaping Use   Vaping Use: Never used  Substance and Sexual Activity   Alcohol use: No   Drug use: No   Sexual activity: Never  Other Topics Concern   Not on file  Social History Narrative   Not employed   Exercise- walking 1 hour daily.    Diet- eating healthy diet.       [facility]:   She is originally from [facility]. Has always lived in [place]. 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 Currently has a small dog. No bird or mold exposure.       Current Social History 04/08/2017        Patient lives with daughter, Beverlee Nims, in two level home 04/08/2017   Transportation: Patient has own vehicle and drives herself [id number]   Important Relationships Daughter, Beverlee Nims 04/08/2017    Pets: Shiatzu  Tempie Donning)  04/08/2017   Education / Work:  10 th grade/ None 04/08/2017   Interests / Fun: Read, do puzzles 04/08/2017   Current Stressors: None because she prays to God 04/08/2017   Religious / Personal Beliefs: Non-Denominational 04/08/2017   L. [place]atte, RN, BSN                                                                                                 Social Determinants of Health   Financial Resource Strain: Not on file  Food Insecurity: Not on file  Transportation Needs: Not on file  Physical Activity: Not on file  Stress: Not on file  Social Connections: Not on file     Family History:  The patient's family history includes Diabetes in her brother and father; Hypertension in her brother and father.   ROS:   Please see the history of present illness.    Otherwise negative ROS  PHYSICAL EXAM:   VS:  BP 110/62 (BP Location: Left Arm, Patient Position: Sitting, Cuff Size: Large)    Ht 5\' 6"  (1.676 m)    Wt 184 lb (83.5 kg)    LMP 07/26/2011    BMI 29.70 kg/m      General: Alert, oriented x3, no distress, overweight.  Healthy subclavian ICD site. Head: no  evidence of trauma, PERRL, EOMI, no exophtalmos or lid lag, no myxedema, no xanthelasma; normal ears, nose and oropharynx Neck: normal jugular venous pulsations and no hepatojugular reflux; brisk carotid pulses without delay and no carotid bruits Chest: clear to auscultation, no signs of consolidation by percussion or palpation, normal fremitus, symmetrical and full respiratory excursions Cardiovascular: normal position and quality of the apical impulse, regular rhythm, normal first and second heart sounds, no murmurs, rubs or gallops Abdomen: no tenderness or distention, no masses by palpation, no abnormal pulsatility or arterial bruits, normal bowel sounds, no hepatosplenomegaly Extremities: no clubbing, cyanosis or edema; 2+ radial, ulnar and brachial pulses bilaterally; 2+ right femoral, posterior tibial and dorsalis pedis  pulses; 2+ left femoral, posterior tibial and dorsalis pedis pulses; no subclavian or femoral bruits Neurological: grossly nonfocal Psych: Normal mood and affect     Wt Readings from Last 3 Encounters:  04/21/21 184 lb (83.5 kg)  04/15/21 182 lb (82.6 kg)  03/27/21 184 lb 12.8 oz (83.8 kg)      Studies/Labs Reviewed:   ECHO 09/19/2018  1. The left ventricle has normal systolic function, with an ejection fraction of 55-60%. The cavity size was normal. There is mild asymmetric left ventricular hypertrophy. Left ventricular diastolic Doppler parameters are consistent with impaired  relaxation. No evidence of left ventricular regional wall motion abnormalities.  2. The average left ventricular global longitudinal strain is 14.9 %.  3. The right ventricle has normal systolic function. The cavity was normal. There is no increase in right ventricular wall thickness.  4. Left atrial size was mildly dilated.  5. The aortic valve is tricuspid. Mild sclerosis of the aortic valve. Aortic valve regurgitation is trivial by color flow Doppler.    EKG:  EKG is ordered today.  It shows atrial paced, ventricular sensed rhythm with a narrow QRS of 90 ms, QTC 465 ms.  Otherwise normal tracing.  Recent Labs: 07/23/2020: BNP 92.1; Magnesium 2.2 04/15/2021: ALT 59; BUN 21; Creatinine, Ser 1.26; Hemoglobin 13.6; Platelets 203; Potassium 4.6; Sodium 138   Lipid Panel    Component Value Date/Time   CHOL 113 01/31/2020 0916   TRIG 93 01/31/2020 0916   HDL 46 01/31/2020 0916   CHOLHDL 2.5 01/31/2020 0916   CHOLHDL 2.4 04/21/2016 1438   VLDL 15 04/21/2016 1438   LDLCALC 49 01/31/2020 0916   LDLDIRECT 94 12/07/2007 2019     ASSESSMENT:    1. VT (ventricular tachycardia)   2. Paroxysmal atrial fibrillation (HCC)   3. Chronic diastolic heart failure ([place])   4. ICD (implantable cardioverter-defibrillator) in place   5. Acquired thrombophilia ([place])   6. Essential hypertension   7.  Hypercholesterolemia   8. Overweight   9. OSA (obstructive sleep apnea)   10. Encounter for monitoring dofetilide therapy   11. Moderate persistent asthma without complication   12. Type 2 diabetes mellitus with diabetic neuropathy, with long-term current use of insulin ([place])   13. Stage 3a chronic kidney disease (HCC)      VT: None has been recorded recently.  Required ICD intervention twice in 2016 and 2022, both times monomorphic VT resolved with with ATP, despite marked improvement in left ventricular systolic function she has had ventricular tachycardia that required device intervention.  She is also at risk of developing torsades while in treatment with dofetilide, but QT is normal. Ongoing ICD therapy is appropriate despite recovered EF. PAF: The burden was low at about 1% and the episodes have never been symptomatic.  On  dofetilide and bisoprolol.  On appropriate anticoagulation.  CHA2DS2-VASc 4 (CHF, gender, age, hypertension, diabetes mellitus. CHF: She is euvolemic both clinically and by OptiVol.  NYHA functional class I, on a very low dose of daily loop diuretic.  It looks like she's lost about 10 pounds of real weight.  We will reassess her dry weight at about 185 pounds or less.  On maximum tolerated doses of beta-blocker and on SGLT2 inhibitor.  Echo shows recovered EF at 55 to 60% so she is not on Entresto.  NYHA functional class I, not on aldosterone antagonist. ICD: Normal device function.  Remote downloads every 3 months. Anticoagulation: no bleeding issues.  Okay to hold the Eliquis for her knee surgery. HTN: Well-controlled. History of cough w ACEi. HLP: Lipid parameters in target range on statin.  Continue. Overweight: Excellent job with weight loss, no longer obese. OSA: Reports compliance with CPAP 100%.  No daytime sleepiness. Tikosyn: QTC, renal function and potassium all stable in normal range.  Monitor every 6 months. Asthma: Tolerates bisoprolol well.  Other  beta-blockers worsened wheezing in the past. DM: Markedly improved control with most recent hemoglobin A1c down to 6.9%.  On SGLT2 inhibitor and GLP-1 agonist. CKD: Stable renal function, GFR 50-60.  Most recent creatinine 1.3.   For some reason her insurance charges more for 10 mg bisoprolol tabs (nonformulary) than it does for 25 mg.  PLAN:  In order of problems listed above:  Fascinating. Have not seen one there before. Medication Adjustments/Labs and Tests Ordered: Current medicines are reviewed at length with the patient today.  Concerns regarding medicines are outlined above.  Medication changes, Labs and Tests ordered today are listed in the Patient Instructions below. Patient Instructions  Medication Instructions:  No changes *If you need a refill on your cardiac medications before your next appointment, please call your pharmacy*   Lab Work: None ordered If you have labs (blood work) drawn today and your tests are completely normal, you will receive your results only by: [place] (if you have MyChart) OR A paper copy in the mail If you have any lab test that is abnormal or we need to change your treatment, we will call you to review the results.   Testing/Procedures: None ordered   Follow-Up: At [facility], you and your health needs are our priority.  As part of our continuing mission to provide you with exceptional heart care, we have created designated Provider Care Teams.  These Care Teams include your primary Cardiologist (physician) and Advanced Practice Providers (APPs -  Physician Assistants and Nurse Practitioners) who all work together to provide you with the care you need, when you need it.  We recommend signing up for the patient portal called "MyChart".  Sign up information is provided on this After Visit Summary.  MyChart is used to connect with patients for Virtual Visits (Telemedicine).  Patients are able to view lab/test results, encounter notes,  upcoming appointments, etc.  Non-urgent messages can be sent to your provider as well.   To learn more about what you can do with MyChart, go to [email or web address].    Your next appointment:   6 month(s)  The format for your next appointment:   In Person  Provider:   Sanda Klein, MD         Signed, Sanda Klein, MD  04/24/2021 3:21 PM    [facility] [place], [place], [place]  [address] Phone: [phone]; Fax: [phone]

## 2021-04-21 NOTE — Patient Instructions (Signed)

## 2021-04-22 DIAGNOSIS — J45909 Unspecified asthma, uncomplicated: Secondary | ICD-10-CM | POA: Diagnosis not present

## 2021-04-22 DIAGNOSIS — G4733 Obstructive sleep apnea (adult) (pediatric): Secondary | ICD-10-CM | POA: Diagnosis not present

## 2021-04-22 DIAGNOSIS — J449 Chronic obstructive pulmonary disease, unspecified: Secondary | ICD-10-CM | POA: Diagnosis not present

## 2021-04-22 NOTE — Care Plan (Signed)
Ortho Bundle Case Management Note  Patient Details  Name: [name] MRN: [id number] Date of Birth: 05/02/1950     Spoke with patient prior to surgery. She will discharge to home with family to assist. Has rolling walker. CPM ordered. OPPT set up with [facility] Patient and MD in agreement with plan. Choice offered                 DME Arranged:  CPM DME Agency:  [facility]  HH Arranged:    HH Agency:     Additional Comments: Please contact me with any questions of if this plan should need to change.  Shauna Hugh,  RN,BSN,MHA,CCM  [facility]opaedic Specialist  [phone] 04/22/2021, 9:27 AM

## 2021-04-24 ENCOUNTER — Encounter (HOSPITAL_COMMUNITY): Payer: Medicare HMO

## 2021-04-24 ENCOUNTER — Other Ambulatory Visit: Payer: Self-pay

## 2021-04-24 ENCOUNTER — Encounter: Payer: Self-pay | Admitting: Cardiovascular Disease

## 2021-04-24 ENCOUNTER — Encounter (HOSPITAL_COMMUNITY)
Admission: RE | Admit: 2021-04-24 | Discharge: 2021-04-24 | Disposition: A | Discharge status: Home / Self Care | Payer: Medicare HMO | Source: Ambulatory Visit | Attending: Orthopedic Surgery | Admitting: Orthopedic Surgery

## 2021-04-24 DIAGNOSIS — Z01812 Encounter for preprocedural laboratory examination: Secondary | ICD-10-CM | POA: Diagnosis present

## 2021-04-24 DIAGNOSIS — Z20822 Contact with and (suspected) exposure to covid-19: Secondary | ICD-10-CM | POA: Insufficient documentation

## 2021-04-24 DIAGNOSIS — Z01818 Encounter for other preprocedural examination: Secondary | ICD-10-CM

## 2021-04-24 LAB — SARS CORONAVIRUS 2 (TAT 6-24 HRS): SARS Coronavirus 2: NEGATIVE

## 2021-04-26 NOTE — H&P (Addendum)
Chief complaint:  Follow up right knee pain.  HPI: The patient is a 71 year old female.  She is known to the office.  She had previous knee replacement on the left with Dr. Noemi Chapel in 2016.  She has continued to struggle with right knee complaints, progressively worsening, and failing other conservative treatments including multiple injections without significant relief and activity modification.  She is at her wits end with the pain affecting her quality of life and activities of daily living.  She is here to discuss definitive treatment options.   Review of systems:  10 point review of systems obtained.  Positive for glasses/contacts, dentures, heart disease, asthma, diabetes, and sleep apnea. Past medical history:   1.  Afib, on Eliquis. 2.  Diabetes, last A1C 6.9. 3.  Obstructive sleep apnea on CPAP. 4.  Congestive heart failure. 5.  GERD. 6.  Persistent asthma. 7.  Hyperlipidemia. 8.  s/p ICD (Medtr[employer], gen change 2019)  Current medications:  Two page list of medicines were reviewed and scanned into the chart today.  She reports drug allergies or intolerances to Avelox, cough with ACE inhibitors, rash with latex, unable to take NSAIDS, simvastatin, and Jardiance.   Family history:  Father and brother both with diabetes. Social history:  Nonsmoker, nondrinker.  She is widowed and lives in a two story residence.  EXAMINATION: Patient is seated in exam room no acute distress. Alert and oriented. Appears appropriate age.  HT: 5 feet, 6 inches; WT: 182 lb; BMI: Calculated at 29.4. Vital signs:  Temp: 98.5, Resp: 18; BP: 130/78; Pulse: 67.  HEENT:  Pupils are equal, round, and reactive to light.  Extraocular muscles are intact. Neck:  Supple with good range of motion.  No cervical adenopathy. Chest and lungs:  Clear to auscultation bilaterally.  Normal breath sounds; normal effort. Cardiac:  Irregular rate and rhythm consistent with her afib.   Abdomen:  Soft, nontender, nondistended.   Active bowel sounds in all four quadrants. Cranial nerves:  Grossly intact.   Skin:  No rash or lesions.   GU/Rectal/Breast:  Not indicated for surgery. Musculoskeletal:  Examination of the right lower extremity again shows neurovascularly intact.  She has decent range of motion on the right knee from 5 to 110 degrees.  She does have medial and lateral joint line tenderness.  Stable to varus/valgus stress.  Slight varus deformity.  Mild swelling.  No significant effusion.  No warmth or erythema.  Intact dorsiflexion and plantar flexion of the ankle.  There is no calf tenderness to palpation.    IMAGING: 1.  No new images today. 2.  X-rays reviewed from 02-18-2021 showing advanced tricompartmental arthritis, slight varus deformity of the right knee with slight medial subluxation.     ASSESSMENT: End-stage osteoarthritis of the right knee - failed conservative treatments. Co-morbidities: 1.  Afib, on Eliquis. 2.  Diabetes, last A1C 6.9. 3.  Obstructive sleep apnea on CPAP. 4.  Congestive heart failure. 5.  GERD. 6.  Persistent asthma. 7.  Hyperlipidemia. 8.  s/p ICD ([employer]tr[employer], gen change 2019)  PLAN: I had a long discussion with the patient today.  The risks and benefits of a right total knee arthroplasty done with Dr. Zachery Dakins who she had met with earlier in November.  She wishes to proceed.  This will be planned for observation overnight status at [facility] on 04-28-2021 with preadmission labs 04-15-2021.  We have obtained medical clearance from Dr. Alcus Dad, Cardiology, Couture, and Pulmonology Dr. Elsworth Soho.  Pulmonologist mentioned  moderate risk with her sleep apnea and asthma, recommending possible recovery in a step-down ICU with pulmonary consultation, aggressive pulmonary toileting with O2 bronchodilation and incentive spirometer and early ambulation.  We will plan on discharge home.  Daughter will be available as well as a sister and a friend.  Outpatient PT to start on  04-30-2021.  DME needs:  CPM.     Will plan on anticoagulation.  She will go back on her Eliquis which she plans on holding three days prior; pain control likely with oxycodone and Tylenol.  She is a candidate for tranexamic acid perioperatively, Ancef for antibiotic.  She was given information, perioperative care, and contact information for Renee, nurse case manager here.

## 2021-04-28 ENCOUNTER — Encounter (HOSPITAL_COMMUNITY)
Admission: RE | Disposition: A | Discharge status: Home / Self Care | Payer: Self-pay | Source: Home / Self Care | Attending: Orthopedic Surgery

## 2021-04-28 ENCOUNTER — Inpatient Hospital Stay (HOSPITAL_COMMUNITY): Payer: Medicare HMO

## 2021-04-28 ENCOUNTER — Observation Stay (HOSPITAL_COMMUNITY)
Admission: RE | Admit: 2021-04-28 | Discharge: 2021-04-30 | Disposition: A | Discharge status: Home / Self Care | Payer: Medicare HMO | Attending: Orthopedic Surgery | Admitting: Orthopedic Surgery

## 2021-04-28 ENCOUNTER — Ambulatory Visit (HOSPITAL_COMMUNITY): Payer: Medicare HMO | Admitting: Physician Assistant

## 2021-04-28 ENCOUNTER — Ambulatory Visit (HOSPITAL_COMMUNITY): Payer: Medicare HMO | Admitting: Anesthesiology

## 2021-04-28 ENCOUNTER — Encounter (HOSPITAL_COMMUNITY): Payer: Self-pay | Admitting: Orthopedic Surgery

## 2021-04-28 DIAGNOSIS — E1122 Type 2 diabetes mellitus with diabetic chronic kidney disease: Secondary | ICD-10-CM | POA: Diagnosis present

## 2021-04-28 DIAGNOSIS — Z79899 Other long term (current) drug therapy: Secondary | ICD-10-CM | POA: Insufficient documentation

## 2021-04-28 DIAGNOSIS — Z794 Long term (current) use of insulin: Secondary | ICD-10-CM

## 2021-04-28 DIAGNOSIS — G4733 Obstructive sleep apnea (adult) (pediatric): Secondary | ICD-10-CM | POA: Diagnosis not present

## 2021-04-28 DIAGNOSIS — E7800 Pure hypercholesterolemia, unspecified: Secondary | ICD-10-CM | POA: Diagnosis present

## 2021-04-28 DIAGNOSIS — I130 Hypertensive heart and chronic kidney disease with heart failure and stage 1 through stage 4 chronic kidney disease, or unspecified chronic kidney disease: Secondary | ICD-10-CM | POA: Diagnosis present

## 2021-04-28 DIAGNOSIS — I509 Heart failure, unspecified: Secondary | ICD-10-CM | POA: Insufficient documentation

## 2021-04-28 DIAGNOSIS — Z6829 Body mass index (BMI) 29.0-29.9, adult: Secondary | ICD-10-CM

## 2021-04-28 DIAGNOSIS — M1711 Unilateral primary osteoarthritis, right knee: Secondary | ICD-10-CM | POA: Diagnosis not present

## 2021-04-28 DIAGNOSIS — K219 Gastro-esophageal reflux disease without esophagitis: Secondary | ICD-10-CM | POA: Diagnosis present

## 2021-04-28 DIAGNOSIS — I5042 Chronic combined systolic (congestive) and diastolic (congestive) heart failure: Secondary | ICD-10-CM | POA: Diagnosis present

## 2021-04-28 DIAGNOSIS — I4891 Unspecified atrial fibrillation: Secondary | ICD-10-CM | POA: Diagnosis not present

## 2021-04-28 DIAGNOSIS — Z833 Family history of diabetes mellitus: Secondary | ICD-10-CM

## 2021-04-28 DIAGNOSIS — Z7901 Long term (current) use of anticoagulants: Secondary | ICD-10-CM

## 2021-04-28 DIAGNOSIS — J4540 Moderate persistent asthma, uncomplicated: Secondary | ICD-10-CM | POA: Diagnosis present

## 2021-04-28 DIAGNOSIS — I480 Paroxysmal atrial fibrillation: Secondary | ICD-10-CM | POA: Diagnosis present

## 2021-04-28 DIAGNOSIS — G8918 Other acute postprocedural pain: Secondary | ICD-10-CM | POA: Diagnosis not present

## 2021-04-28 DIAGNOSIS — N1831 Chronic kidney disease, stage 3a: Secondary | ICD-10-CM | POA: Diagnosis present

## 2021-04-28 DIAGNOSIS — Z96651 Presence of right artificial knee joint: Secondary | ICD-10-CM | POA: Diagnosis not present

## 2021-04-28 DIAGNOSIS — Z95810 Presence of automatic (implantable) cardiac defibrillator: Secondary | ICD-10-CM | POA: Diagnosis present

## 2021-04-28 DIAGNOSIS — E669 Obesity, unspecified: Secondary | ICD-10-CM | POA: Diagnosis present

## 2021-04-28 DIAGNOSIS — Z9989 Dependence on other enabling machines and devices: Secondary | ICD-10-CM | POA: Insufficient documentation

## 2021-04-28 DIAGNOSIS — Z9889 Other specified postprocedural states: Secondary | ICD-10-CM

## 2021-04-28 DIAGNOSIS — E1140 Type 2 diabetes mellitus with diabetic neuropathy, unspecified: Secondary | ICD-10-CM | POA: Diagnosis present

## 2021-04-28 DIAGNOSIS — I739 Peripheral vascular disease, unspecified: Secondary | ICD-10-CM

## 2021-04-28 DIAGNOSIS — I5032 Chronic diastolic (congestive) heart failure: Secondary | ICD-10-CM | POA: Diagnosis present

## 2021-04-28 DIAGNOSIS — I10 Essential (primary) hypertension: Secondary | ICD-10-CM | POA: Diagnosis present

## 2021-04-28 DIAGNOSIS — E119 Type 2 diabetes mellitus without complications: Secondary | ICD-10-CM | POA: Insufficient documentation

## 2021-04-28 DIAGNOSIS — N182 Chronic kidney disease, stage 2 (mild): Secondary | ICD-10-CM | POA: Diagnosis present

## 2021-04-28 DIAGNOSIS — K449 Diaphragmatic hernia without obstruction or gangrene: Secondary | ICD-10-CM | POA: Diagnosis present

## 2021-04-28 HISTORY — PX: TOTAL KNEE ARTHROPLASTY: SHX125

## 2021-04-28 LAB — GLUCOSE, CAPILLARY
Glucose-Capillary: 129 mg/dL — ABNORMAL HIGH (ref 70–99)
Glucose-Capillary: 103 mg/dL — ABNORMAL HIGH (ref 70–99)
Glucose-Capillary: 224 mg/dL — ABNORMAL HIGH (ref 70–99)
Glucose-Capillary: 307 mg/dL — ABNORMAL HIGH (ref 70–99)

## 2021-04-28 LAB — PROTIME-INR
INR: 1.1 (ref 0.8–1.2)
Prothrombin Time: 14.5 seconds (ref 11.4–15.2)

## 2021-04-28 LAB — MRSA NEXT GEN BY PCR, NASAL: MRSA by PCR Next Gen: NOT DETECTED

## 2021-04-28 LAB — APTT: aPTT: 20 seconds — ABNORMAL LOW (ref 24–36)

## 2021-04-28 SURGERY — ARTHROPLASTY, KNEE, TOTAL
Anesthesia: Spinal | Site: Knee | Laterality: Right

## 2021-04-28 MED ORDER — CEFAZOLIN SODIUM-DEXTROSE 2-4 GM/100ML-% IV SOLN
2.0000 g | INTRAVENOUS | Status: AC
  Administered 2021-04-28: 2 g via INTRAVENOUS
  Filled 2021-04-28: qty 100

## 2021-04-28 MED ORDER — PHENYLEPHRINE HCL (PRESSORS) 10 MG/ML IV SOLN
INTRAVENOUS | Status: AC
  Filled 2021-04-28: qty 1

## 2021-04-28 MED ORDER — ZOLPIDEM TARTRATE 5 MG PO TABS: 5.0000 mg | ORAL_TABLET | Freq: Every evening | ORAL | Status: DC | PRN

## 2021-04-28 MED ORDER — ACARBOSE 25 MG PO TABS
50.0000 mg | ORAL_TABLET | Freq: Three times a day (TID) | ORAL | Status: DC
  Administered 2021-04-28 – 2021-04-30 (×6): 50 mg via ORAL
  Filled 2021-04-28 (×9): qty 2

## 2021-04-28 MED ORDER — FENTANYL CITRATE (PF) 100 MCG/2ML IJ SOLN
INTRAMUSCULAR | Status: DC | PRN
  Administered 2021-04-28 (×2): 50 ug via INTRAVENOUS

## 2021-04-28 MED ORDER — MONTELUKAST SODIUM 10 MG PO TABS
10.0000 mg | ORAL_TABLET | Freq: Every day | ORAL | Status: DC
  Administered 2021-04-28 – 2021-04-30 (×2): 10 mg via ORAL
  Filled 2021-04-28: qty 1

## 2021-04-28 MED ORDER — BUPIVACAINE LIPOSOME 1.3 % IJ SUSP
INTRAMUSCULAR | Status: DC | PRN
  Administered 2021-04-28: 20 mL

## 2021-04-28 MED ORDER — APIXABAN 2.5 MG PO TABS
2.5000 mg | ORAL_TABLET | Freq: Two times a day (BID) | ORAL | Status: DC
  Administered 2021-04-29 – 2021-04-30 (×3): 2.5 mg via ORAL
  Filled 2021-04-28 (×3): qty 1

## 2021-04-28 MED ORDER — PANTOPRAZOLE SODIUM 40 MG PO TBEC
40.0000 mg | DELAYED_RELEASE_TABLET | Freq: Every day | ORAL | Status: DC
  Administered 2021-04-29 – 2021-04-30 (×2): 40 mg via ORAL
  Filled 2021-04-28 (×2): qty 1

## 2021-04-28 MED ORDER — CHLORHEXIDINE GLUCONATE 0.12 % MT SOLN
15.0000 mL | Freq: Once | OROMUCOSAL | Status: AC
  Administered 2021-04-28: 15 mL via OROMUCOSAL

## 2021-04-28 MED ORDER — DIPHENHYDRAMINE HCL 12.5 MG/5ML PO ELIX: 12.5000 mg | ORAL_SOLUTION | ORAL | Status: DC | PRN

## 2021-04-28 MED ORDER — TORSEMIDE 20 MG PO TABS
20.0000 mg | ORAL_TABLET | Freq: Every day | ORAL | Status: DC
  Administered 2021-04-29 – 2021-04-30 (×2): 20 mg via ORAL
  Filled 2021-04-28 (×2): qty 1

## 2021-04-28 MED ORDER — HYDROMORPHONE HCL 1 MG/ML IJ SOLN
0.5000 mg | INTRAMUSCULAR | Status: DC | PRN
  Administered 2021-04-28: 1 mg via INTRAVENOUS
  Administered 2021-04-28: 0.5 mg via INTRAVENOUS
  Administered 2021-04-29 (×3): 1 mg via INTRAVENOUS
  Filled 2021-04-28 (×5): qty 1

## 2021-04-28 MED ORDER — INSULIN ASPART 100 UNIT/ML IJ SOLN
0.0000 [IU] | Freq: Three times a day (TID) | INTRAMUSCULAR | Status: DC
  Administered 2021-04-28 – 2021-04-30 (×6): 5 [IU] via SUBCUTANEOUS

## 2021-04-28 MED ORDER — ROSUVASTATIN CALCIUM 10 MG PO TABS
10.0000 mg | ORAL_TABLET | Freq: Every day | ORAL | Status: DC
Start: 2021-04-29 — End: 2021-04-30
  Administered 2021-04-29 – 2021-04-30 (×2): 10 mg via ORAL
  Filled 2021-04-28 (×2): qty 1

## 2021-04-28 MED ORDER — MIDAZOLAM HCL 2 MG/2ML IJ SOLN
INTRAMUSCULAR | Status: AC
  Filled 2021-04-28: qty 2

## 2021-04-28 MED ORDER — POVIDONE-IODINE 10 % EX SWAB
2.0000 "application " | Freq: Once | CUTANEOUS | Status: AC
  Administered 2021-04-28: 2 via TOPICAL

## 2021-04-28 MED ORDER — FAMOTIDINE 20 MG PO TABS
20.0000 mg | ORAL_TABLET | Freq: Every day | ORAL | Status: DC
  Administered 2021-04-29 – 2021-04-30 (×2): 20 mg via ORAL
  Filled 2021-04-28 (×2): qty 1

## 2021-04-28 MED ORDER — LACTATED RINGERS IV SOLN: INTRAVENOUS | Status: DC

## 2021-04-28 MED ORDER — DOCUSATE SODIUM 100 MG PO CAPS
100.0000 mg | ORAL_CAPSULE | Freq: Two times a day (BID) | ORAL | Status: DC
  Administered 2021-04-28 – 2021-04-30 (×4): 100 mg via ORAL
  Filled 2021-04-28 (×4): qty 1

## 2021-04-28 MED ORDER — ACETAMINOPHEN 325 MG PO TABS
325.0000 mg | ORAL_TABLET | Freq: Four times a day (QID) | ORAL | Status: DC | PRN
  Administered 2021-04-29: 650 mg via ORAL
  Filled 2021-04-28: qty 2

## 2021-04-28 MED ORDER — ACETAMINOPHEN 500 MG PO TABS: 1000.0000 mg | ORAL_TABLET | Freq: Once | ORAL | Status: DC

## 2021-04-28 MED ORDER — MENTHOL 3 MG MT LOZG: 1.0000 | LOZENGE | OROMUCOSAL | Status: DC | PRN

## 2021-04-28 MED ORDER — BUPIVACAINE LIPOSOME 1.3 % IJ SUSP
INTRAMUSCULAR | Status: AC
  Filled 2021-04-28: qty 20

## 2021-04-28 MED ORDER — ROPIVACAINE HCL 7.5 MG/ML IJ SOLN
INTRAMUSCULAR | Status: DC | PRN
  Administered 2021-04-28: 20 mL via PERINEURAL

## 2021-04-28 MED ORDER — ONDANSETRON HCL 4 MG PO TABS: 4.0000 mg | ORAL_TABLET | Freq: Four times a day (QID) | ORAL | Status: DC | PRN

## 2021-04-28 MED ORDER — ONDANSETRON HCL 4 MG/2ML IJ SOLN
INTRAMUSCULAR | Status: AC
  Filled 2021-04-28: qty 2

## 2021-04-28 MED ORDER — PROPOFOL 500 MG/50ML IV EMUL
INTRAVENOUS | Status: DC | PRN
  Administered 2021-04-28: 50 ug/kg/min via INTRAVENOUS

## 2021-04-28 MED ORDER — OXYCODONE HCL 5 MG PO TABS
5.0000 mg | ORAL_TABLET | ORAL | Status: DC | PRN
  Administered 2021-04-28: 10 mg via ORAL
  Administered 2021-04-28: 5 mg via ORAL
  Administered 2021-04-29 – 2021-04-30 (×6): 10 mg via ORAL
  Filled 2021-04-28 (×7): qty 2
  Filled 2021-04-28: qty 1
  Filled 2021-04-28: qty 2

## 2021-04-28 MED ORDER — BISOPROLOL FUMARATE 5 MG PO TABS
10.0000 mg | ORAL_TABLET | Freq: Two times a day (BID) | ORAL | Status: DC
  Administered 2021-04-28 – 2021-04-30 (×4): 10 mg via ORAL
  Filled 2021-04-28 (×4): qty 2

## 2021-04-28 MED ORDER — FENTANYL CITRATE PF 50 MCG/ML IJ SOSY: 25.0000 ug | PREFILLED_SYRINGE | INTRAMUSCULAR | Status: DC | PRN

## 2021-04-28 MED ORDER — PROPOFOL 500 MG/50ML IV EMUL
INTRAVENOUS | Status: AC
  Filled 2021-04-28: qty 50

## 2021-04-28 MED ORDER — TRANEXAMIC ACID-NACL 1000-0.7 MG/100ML-% IV SOLN
1000.0000 mg | INTRAVENOUS | Status: AC
  Administered 2021-04-28: 1000 mg via INTRAVENOUS
  Filled 2021-04-28: qty 100

## 2021-04-28 MED ORDER — DEXAMETHASONE SODIUM PHOSPHATE 10 MG/ML IJ SOLN
INTRAMUSCULAR | Status: DC | PRN
  Administered 2021-04-28: 8 mg via INTRAVENOUS

## 2021-04-28 MED ORDER — APIXABAN 5 MG PO TABS
5.0000 mg | ORAL_TABLET | Freq: Two times a day (BID) | ORAL | Status: DC
  Filled 2021-04-28: qty 1

## 2021-04-28 MED ORDER — 0.9 % SODIUM CHLORIDE (POUR BTL) OPTIME
TOPICAL | Status: DC | PRN
  Administered 2021-04-28: 3000 mL
  Administered 2021-04-28: 1000 mL

## 2021-04-28 MED ORDER — FENTANYL CITRATE (PF) 100 MCG/2ML IJ SOLN
INTRAMUSCULAR | Status: AC
  Filled 2021-04-28: qty 2

## 2021-04-28 MED ORDER — SODIUM CHLORIDE 0.9% FLUSH
INTRAVENOUS | Status: DC | PRN
  Administered 2021-04-28: 60 mL

## 2021-04-28 MED ORDER — OXYCODONE HCL 5 MG/5ML PO SOLN: 5.0000 mg | Freq: Once | ORAL | Status: DC | PRN

## 2021-04-28 MED ORDER — BUPIVACAINE HCL (PF) 0.5 % IJ SOLN
INTRAMUSCULAR | Status: DC | PRN
  Administered 2021-04-28: 2.8 mL via INTRATHECAL

## 2021-04-28 MED ORDER — BUPIVACAINE LIPOSOME 1.3 % IJ SUSP: 20.0000 mL | Freq: Once | INTRAMUSCULAR | Status: DC

## 2021-04-28 MED ORDER — POTASSIUM CHLORIDE CRYS ER 20 MEQ PO TBCR
20.0000 meq | EXTENDED_RELEASE_TABLET | Freq: Every day | ORAL | Status: DC
  Administered 2021-04-29 – 2021-04-30 (×2): 20 meq via ORAL
  Filled 2021-04-28 (×2): qty 1

## 2021-04-28 MED ORDER — ORAL CARE MOUTH RINSE: 15.0000 mL | Freq: Once | OROMUCOSAL | Status: AC

## 2021-04-28 MED ORDER — ONDANSETRON HCL 4 MG/2ML IJ SOLN
4.0000 mg | Freq: Four times a day (QID) | INTRAMUSCULAR | Status: DC | PRN
  Administered 2021-04-29: 4 mg via INTRAVENOUS
  Filled 2021-04-28: qty 2

## 2021-04-28 MED ORDER — PHENOL 1.4 % MT LIQD: 1.0000 | OROMUCOSAL | Status: DC | PRN

## 2021-04-28 MED ORDER — FLUTICASONE FUROATE-VILANTEROL 100-25 MCG/ACT IN AEPB
1.0000 | INHALATION_SPRAY | Freq: Every day | RESPIRATORY_TRACT | Status: DC
  Administered 2021-04-29 – 2021-04-30 (×2): 1 via RESPIRATORY_TRACT
  Filled 2021-04-28: qty 28

## 2021-04-28 MED ORDER — MIDAZOLAM HCL 5 MG/5ML IJ SOLN
INTRAMUSCULAR | Status: DC | PRN
  Administered 2021-04-28 (×2): 1 mg via INTRAVENOUS

## 2021-04-28 MED ORDER — ACETAMINOPHEN 500 MG PO TABS
1000.0000 mg | ORAL_TABLET | Freq: Once | ORAL | Status: AC
  Filled 2021-04-28: qty 2

## 2021-04-28 MED ORDER — ONDANSETRON HCL 4 MG/2ML IJ SOLN: 4.0000 mg | Freq: Once | INTRAMUSCULAR | Status: DC | PRN

## 2021-04-28 MED ORDER — DOFETILIDE 250 MCG PO CAPS
250.0000 ug | ORAL_CAPSULE | Freq: Two times a day (BID) | ORAL | Status: DC
  Administered 2021-04-28 – 2021-04-30 (×4): 250 ug via ORAL
  Filled 2021-04-28 (×5): qty 1

## 2021-04-28 MED ORDER — SODIUM CHLORIDE (PF) 0.9 % IJ SOLN
INTRAMUSCULAR | Status: AC
  Filled 2021-04-28: qty 60

## 2021-04-28 MED ORDER — BUPROPION HCL ER (XL) 150 MG PO TB24
150.0000 mg | ORAL_TABLET | Freq: Every day | ORAL | Status: DC
  Administered 2021-04-29 – 2021-04-30 (×2): 150 mg via ORAL
  Filled 2021-04-28 (×2): qty 1

## 2021-04-28 MED ORDER — SODIUM CHLORIDE 0.9 % IR SOLN
Status: DC | PRN
  Administered 2021-04-28: 1000 mL

## 2021-04-28 MED ORDER — INSULIN ASPART 100 UNIT/ML IJ SOLN
0.0000 [IU] | Freq: Every day | INTRAMUSCULAR | Status: DC
  Administered 2021-04-28: 4 [IU] via SUBCUTANEOUS
  Administered 2021-04-30: 2 [IU] via SUBCUTANEOUS

## 2021-04-28 MED ORDER — PROPOFOL 10 MG/ML IV BOLUS
INTRAVENOUS | Status: AC
  Filled 2021-04-28: qty 20

## 2021-04-28 MED ORDER — ACETAMINOPHEN 500 MG PO TABS
1000.0000 mg | ORAL_TABLET | Freq: Four times a day (QID) | ORAL | Status: AC
  Administered 2021-04-28 (×2): 1000 mg via ORAL
  Filled 2021-04-28 (×4): qty 2

## 2021-04-28 MED ORDER — SODIUM CHLORIDE 0.9 % IV SOLN: INTRAVENOUS | Status: DC

## 2021-04-28 MED ORDER — OXYCODONE HCL 5 MG PO TABS: 5.0000 mg | ORAL_TABLET | Freq: Once | ORAL | Status: DC | PRN

## 2021-04-28 MED ORDER — DEXAMETHASONE SODIUM PHOSPHATE 10 MG/ML IJ SOLN
INTRAMUSCULAR | Status: AC
  Filled 2021-04-28: qty 1

## 2021-04-28 MED ORDER — CHLORHEXIDINE GLUCONATE CLOTH 2 % EX PADS
6.0000 | MEDICATED_PAD | Freq: Every day | CUTANEOUS | Status: DC
  Administered 2021-04-28 – 2021-04-30 (×2): 6 via TOPICAL

## 2021-04-28 MED ORDER — PHENYLEPHRINE HCL-NACL 20-0.9 MG/250ML-% IV SOLN
INTRAVENOUS | Status: DC | PRN
  Administered 2021-04-28: 50 ug/min via INTRAVENOUS

## 2021-04-28 MED ORDER — WATER FOR IRRIGATION, STERILE IR SOLN
Status: DC | PRN
  Administered 2021-04-28: 2000 mL

## 2021-04-28 SURGICAL SUPPLY — 63 items
ADH SKN CLS APL DERMABOND .7 (GAUZE/BANDAGES/DRESSINGS) ×1
APL PRP STRL LF DISP 70% ISPRP (MISCELLANEOUS) ×2
BAG COUNTER SPONGE SURGICOUNT (BAG) IMPLANT
BAG SPNG CNTER NS LX DISP (BAG)
BLADE HEX COATED 2.75 (ELECTRODE) ×3 IMPLANT
BLADE SAG 18X100X1.27 (BLADE) ×3 IMPLANT
BLADE SAW SAG 35X64 .89 (BLADE) ×3 IMPLANT
BNDG CMPR MED 10X6 ELC LF (GAUZE/BANDAGES/DRESSINGS) ×1
BNDG CMPR MED 15X6 ELC VLCR LF (GAUZE/BANDAGES/DRESSINGS) ×1
BNDG ELASTIC 6X10 VLCR STRL LF (GAUZE/BANDAGES/DRESSINGS) ×3 IMPLANT
BNDG ELASTIC 6X15 VLCR STRL LF (GAUZE/BANDAGES/DRESSINGS) ×1 IMPLANT
BOWL SMART MIX CTS (DISPOSABLE) IMPLANT
BSPLAT TIB 5D E CMNT KN RT (Knees) ×1 IMPLANT
CEMENT BONE R 1X40 (Cement) ×2 IMPLANT
CHLORAPREP W/TINT 26 (MISCELLANEOUS) ×6 IMPLANT
COMP FEMUR CMT NRW 10 RT (Joint) ×2 IMPLANT
COMPONENT FEMUR CMT NRW 10 RT (Joint) IMPLANT
COVER SURGICAL LIGHT HANDLE (MISCELLANEOUS) ×3 IMPLANT
CUFF TOURN SGL QUICK 34 (TOURNIQUET CUFF) ×2
CUFF TRNQT CYL 34X4.125X (TOURNIQUET CUFF) ×2 IMPLANT
DECANTER SPIKE VIAL GLASS SM (MISCELLANEOUS) ×3 IMPLANT
DERMABOND ADVANCED (GAUZE/BANDAGES/DRESSINGS) ×1
DERMABOND ADVANCED .7 DNX12 (GAUZE/BANDAGES/DRESSINGS) ×2 IMPLANT
DRAPE INCISE IOBAN 66X45 STRL (DRAPES) IMPLANT
DRAPE INCISE IOBAN 85X60 (DRAPES) ×3 IMPLANT
DRAPE SHEET LG 3/4 BI-LAMINATE (DRAPES) ×3 IMPLANT
DRAPE U-SHAPE 47X51 STRL (DRAPES) ×3 IMPLANT
DRESSING AQUACEL AG SP 3.5X10 (GAUZE/BANDAGES/DRESSINGS) ×2 IMPLANT
DRSG AQUACEL AG SP 3.5X10 (GAUZE/BANDAGES/DRESSINGS) ×2
DRSG PAD ABDOMINAL 8X10 ST (GAUZE/BANDAGES/DRESSINGS) ×2 IMPLANT
GLOVE SRG 8 PF TXTR STRL LF DI (GLOVE) ×2 IMPLANT
GLOVE SURG ENC MOIS LTX SZ8 (GLOVE) ×6 IMPLANT
GLOVE SURG UNDER POLY LF SZ8 (GLOVE) ×2
GOWN STRL REUS W/TWL XL LVL3 (GOWN DISPOSABLE) ×3 IMPLANT
HANDPIECE INTERPULSE COAX TIP (DISPOSABLE) ×2
HOOD PEEL AWAY FLYTE STAYCOOL (MISCELLANEOUS) ×9 IMPLANT
INSERT TIBIA KNEE RIGHT 10 (Joint) ×1 IMPLANT
IRRIGATION SURGIPHOR STRL (IV SOLUTION) ×3 IMPLANT
MANIFOLD NEPTUNE II (INSTRUMENTS) ×3 IMPLANT
MARKER SKIN DUAL TIP RULER LAB (MISCELLANEOUS) ×6 IMPLANT
NS IRRIG 1000ML POUR BTL (IV SOLUTION) ×3 IMPLANT
PACK TOTAL KNEE CUSTOM (KITS) ×3 IMPLANT
PROTECTOR NERVE ULNAR (MISCELLANEOUS) ×3 IMPLANT
SCREW HEADED 33MM KNEE (MISCELLANEOUS) ×2 IMPLANT
SCREW HEADED 48MM KNEE (MISCELLANEOUS) ×2 IMPLANT
SET HNDPC FAN SPRY TIP SCT (DISPOSABLE) ×2 IMPLANT
SPONGE T-LAP 18X18 ~~LOC~~+RFID (SPONGE) ×9 IMPLANT
STEM POLY PAT PLY 35M KNEE (Knees) ×1 IMPLANT
STEM TIB ST PERS 14+30 (Stem) ×1 IMPLANT
STEM TIBIA 5 DEG SZ E R KNEE (Knees) IMPLANT
SUT MNCRL AB 3-0 PS2 18 (SUTURE) ×3 IMPLANT
SUT STRATAFIX 0 PDS 27 VIOLET (SUTURE) ×2
SUT STRATAFIX PDO 1 14 VIOLET (SUTURE) ×2
SUT STRATFX PDO 1 14 VIOLET (SUTURE) ×1
SUT VIC AB 2-0 CT2 27 (SUTURE) ×6 IMPLANT
SUTURE STRATFX 0 PDS 27 VIOLET (SUTURE) ×2 IMPLANT
SUTURE STRATFX PDO 1 14 VIOLET (SUTURE) ×2 IMPLANT
SYR 50ML LL SCALE MARK (SYRINGE) ×3 IMPLANT
TIBIA STEM 5 DEG SZ E R KNEE (Knees) ×2 IMPLANT
TRAY FOLEY MTR SLVR 14FR STAT (SET/KITS/TRAYS/PACK) IMPLANT
TRAY FOLEY MTR SLVR 16FR STAT (SET/KITS/TRAYS/PACK) IMPLANT
TUBE SUCTION HIGH CAP CLEAR NV (SUCTIONS) ×3 IMPLANT
WRAP KNEE MAXI GEL POST OP (GAUZE/BANDAGES/DRESSINGS) IMPLANT

## 2021-04-28 NOTE — Evaluation (Signed)
Physical Therapy Evaluation Patient Details Name: [name] MRN: [id number] DOB: Apr 13, 1951 Today's Date: 04/28/2021  History of Present Illness  70 yo female s/p  R TKA  preop pulmonary recs admit pt to step down unit after surgery (no complications).  PMH: L TKA, obesity, CKD, DM, neuropathy, CHF, HTN, afib, cardiac arrest.  Clinical Impression  Pt is s/p TKA resulting in the deficits listed below (see PT Problem List).  PT performed sit<>stand x2 with mod assist of 2. Sensation intact in LEs and able to SLR in bed, however knees buckling in standing likely  d/t residual effects of anesthesia. Returned pt to bed for safety.  Anticipate steady progress   Pt will benefit from skilled PT to increase their independence and safety with mobility to allow discharge to the venue listed below.         Recommendations for follow up therapy are one component of a multi-disciplinary discharge planning process, led by the attending physician.  Recommendations may be updated based on patient status, additional functional criteria and insurance authorization.  Follow Up Recommendations Follow physician's recommendations for discharge plan and follow up therapies    Assistance Recommended at Discharge Intermittent Supervision/Assistance  Patient can return home with the following  Help with stairs or ramp for entrance;Assist for transportation    Equipment Recommendations None recommended by PT  Recommendations for Other Services       Functional Status Assessment Patient has had a recent decline in their functional status and demonstrates the ability to make significant improvements in function in a reasonable and predictable amount of time.     Precautions / Restrictions Precautions Precautions: Fall;Knee Restrictions Weight Bearing Restrictions: No Other Position/Activity Restrictions: WBAT      Mobility  Bed Mobility Overal bed mobility: Needs Assistance Bed Mobility: Supine to  Sit;Sit to Supine     Supine to sit: Min assist Sit to supine: Mod assist   General bed mobility comments: incr time to come to sit, cues to self assist; assist to lift LEs on to bed    Transfers Overall transfer level: Needs assistance Equipment used: Rolling walker (2 wheels) Transfers: Sit to/from Stand Sit to Stand: Mod assist;+2 safety/equipment;+2 physical assistance           General transfer comment: STS x2. bil knees buckling with incr standing time or attempt to wt shift. (likely d/t residual effects of anesthesia)    Ambulation/Gait               General Gait Details: deferred for pt safety  Stairs            Wheelchair Mobility    Modified Rankin (Stroke Patients Only)       Balance                                             Pertinent Vitals/Pain Pain Assessment: 0-10 Pain Location: right knee Pain Descriptors / Indicators: Aching;Sore Pain Intervention(s): Limited activity within patient's tolerance;Monitored during session;Premedicated before session;Repositioned    Home Living Family/patient expects to be discharged to:: Private residence Living Arrangements: Children Available Help at Discharge: Family;Available 24 hours/day Type of Home: House Home Access: Stairs to enter   [employer] of Steps: 1   Home Layout: Two level;Able to live on main level with bedroom/bathroom Home Equipment: BSC/3in1;Adaptive equipment;Shower Counsellor (2 wheels)  Prior Function Prior Level of Function : Independent/Modified Independent                     Hand Dominance        Extremity/Trunk Assessment   Upper Extremity Assessment Upper Extremity Assessment: Overall WFL for tasks assessed    Lower Extremity Assessment Lower Extremity Assessment: RLE deficits/detail RLE Deficits / Details: ankle WFL, knee extension and hip flexion 2+/5, able to SLR with ~ 5 to 10 degree quad lag        Communication      Cognition Arousal/Alertness: Awake/alert Behavior During Therapy: WFL for tasks assessed/performed Overall Cognitive Status: Within Functional Limits for tasks assessed                                          General Comments      Exercises Total Joint Exercises Ankle Circles/Pumps: AROM;Both;10 reps Quad Sets: AROM;5 reps   Assessment/Plan    PT Assessment Patient needs continued PT services  PT Problem List Decreased strength;Decreased range of motion;Decreased activity tolerance;Decreased balance;Decreased mobility;Decreased knowledge of use of DME       PT Treatment Interventions DME instruction;Therapeutic activities;Gait training;Therapeutic exercise;Patient/family education;Functional mobility training;Stair training    PT Goals (Current goals can be found in the Care Plan section)  Acute Rehab PT Goals Patient Stated Goal: have less pain with moving around PT Goal Formulation: With patient Time For Goal Achievement: 05/05/21 Potential to Achieve Goals: Good    Frequency 7X/week     Co-evaluation               AM-PAC PT "6 Clicks" Mobility  Outcome Measure Help needed turning from your back to your side while in a flat bed without using bedrails?: A Little Help needed moving from lying on your back to sitting on the side of a flat bed without using bedrails?: A Little Help needed moving to and from a bed to a chair (including a wheelchair)?: Total Help needed standing up from a chair using your arms (e.g., wheelchair or bedside chair)?: Total Help needed to walk in hospital room?: Total Help needed climbing 3-5 steps with a railing? : Total 6 Click Score: 10    End of Session Equipment Utilized During Treatment: Gait belt Activity Tolerance: Patient tolerated treatment well;Other (comment) (limited by effects of spinal) Patient left: in bed;with family/visitor present;with call bell/phone within reach;with bed  alarm set   PT Visit Diagnosis: Other abnormalities of gait and mobility (R26.89);Difficulty in walking, not elsewhere classified (R26.2)    Time: 3295-1884 PT Time Calculation (min) (ACUTE ONLY): 36 min   Charges:   PT Evaluation $PT Eval Low Complexity: 1 Low PT Treatments $Therapeutic Activity: 8-22 mins        Delice Bison, PT  Acute Rehab Dept ([facility]) [phone] Pager [phone]  04/28/2021   [facility] 04/28/2021, 4:12 PM

## 2021-04-28 NOTE — Anesthesia Procedure Notes (Signed)
Date/Time: 04/28/2021 7:29 AM Performed by: Florene Route, CRNA Oxygen Delivery Method: Simple face mask

## 2021-04-28 NOTE — Care Management CC44 (Signed)
Condition Code 44 Documentation Completed  Patient Details  Name: [name] MRN: [id number] Date of Birth: February 14, 1951   Condition Code 44 given:    Patient signature on Condition Code 44 notice:    Documentation of 2 MD's agreement:    Code 44 added to claim:       Golda Acre, RN 04/28/2021, 1:46 PM

## 2021-04-28 NOTE — Anesthesia Procedure Notes (Signed)
Spinal  Patient location during procedure: OR Start time: 04/28/2021 7:32 AM End time: 04/28/2021 7:35 AM Reason for block: surgical anesthesia Staffing Performed: anesthesiologist  Anesthesiologist: Audry Pili, MD Preanesthetic Checklist Completed: patient identified, IV checked, risks and benefits discussed, surgical consent, monitors and equipment checked, pre-op evaluation and timeout performed Spinal Block Patient position: sitting Prep: DuraPrep Patient monitoring: heart rate, cardiac monitor, continuous pulse ox and blood pressure Approach: midline Location: L3-4 Injection technique: single-shot Needle Needle type: Pencan  Needle gauge: 24 G Additional Notes Consent was obtained prior to the procedure with all questions answered and concerns addressed. Risks including, but not limited to, bleeding, infection, nerve damage, paralysis, failed block, inadequate analgesia, allergic reaction, high spinal, itching, and headache were discussed and the patient wished to proceed. Functioning IV was confirmed and monitors were applied. Sterile prep and drape, including hand hygiene, mask, and sterile gloves were used. The patient was positioned and the spine was prepped. The skin was anesthetized with lidocaine. Free flow of clear CSF was obtained prior to injecting local anesthetic into the CSF. The spinal needle aspirated freely following injection. The needle was carefully withdrawn. The patient tolerated the procedure well.   Renold Don, MD

## 2021-04-28 NOTE — Op Note (Addendum)
DATE OF SURGERY:  04/28/2021 TIME: 9:57 AM  PATIENT NAME:  [name]   AGE: 71 y.o.    PRE-OPERATIVE DIAGNOSIS: End-stage right knee osteoarthritis  POST-OPERATIVE DIAGNOSIS:  Same  PROCEDURE: Right total Knee Arthroplasty  SURGEON:  Samael Blades A Emilyann Banka, MD   ASSISTANT: Luna Glasgow, PA-C, present and scrubbed throughout the case, critical for assistance with exposure, retraction, instrumentation, and closure.   OPERATIVE IMPLANTS:  Zimmer persona cemented femur 10 narrow, E tibia, 30 mm short stem, 35 mm patella, 10 MC poly     PREOPERATIVE INDICATIONS:  [name] is a 71 y.o. year old female with end stage bone on bone degenerative arthritis of the knee who failed conservative treatment, including injections, antiinflammatories, activity modification, and assistive devices, and had significant impairment of their activities of daily living, and elected for Total Knee Arthroplasty.   The risks, benefits, and alternatives were discussed at length including but not limited to the risks of infection, bleeding, nerve injury, stiffness, blood clots, the need for revision surgery, cardiopulmonary complications, among others, and they were willing to proceed.  ESTIMATED BLOOD LOSS: 50cc  OPERATIVE DESCRIPTION:   Once adequate anesthesia, preoperative antibiotics, 2 gm of ancef,1 gm of Tranexamic Acid, the patient was positioned supine with a Right thigh tourniquet placed.  The right lower extremity was prepped and draped in sterile fashion.  A time-  out was performed identifying the patient, planned procedure, and the appropriate extremity.     The leg was  exsanguinated, tourniquet elevated to 250 mmHg.  A midline incision was   made followed by median parapatellar arthrotomy. Anterior horn of the medial meniscus was released and resected. A medial release was performed, the infrapatellar fat pad was resected with care taken to protect the patellar tendon. The  suprapatellar fat was removed to exposed the distal anterior femur. The anterior horn of the lateral meniscus and ACL were released.    Following initial  exposure, attention was first to the femur.  The femoral   canal was opened with a drill, irrigated to try to prevent fat emboli.  An   intramedullary rod was passed set at 5 degrees valgus, 10 mm. The distal femur was resected.  Following this resection, the tibia was   subluxated anteriorly.  Using the extramedullary guide, 10 mm of bone was resected off   the proximal lateral tibia.  Retractors were removed and spacer block 10 mm was placed the patient still had a mild flexion contracture following this cut.  The femoral cut appeared deficient in the notch and additional 2 mm was resected from the distal femur.  We confirmed the gap would be   stable medially and laterally with a size 65mm spacer block as well as confirmed that the tibial cut was perpendicular in the coronal plane, checking with an alignment rod.    Once this was done, the posterior femoral referencing femoral sizer was placed under to the posterior condyles with 3 degrees of external rotational. The femur was sized to be a size 10 in the anterior-  posterior dimension. The   anterior, posterior, and  chamfer cuts were made without difficulty nor   notching making certain that I was along the anterior cortex to help   with flexion gap stability.  Next a laminar spreader was placed with the knee in flexion and the medial lateral menisci were resected.  5 cc of the Exparel mixture was injected in the medial side of the back of the  knee and 3 cc in the lateral side.  1/2 inch curved osteotome was used to resect posterior osteophyte that was then removed with a pituitary rongeur.       At this point, the tibia was sized to be a size E.  The size E tray was   then pinned in position. Trial reduction was now carried with a 10 femur,  E tibia, a 10 mm MC insert.  Attention was next  directed to the patella.  Precut  measurement was noted to be 23 mm.  I resected down to 13 mm and used a  35 patellar button to restore patellar height as well as cover the cut surface.     The patella lug holes were drilled and a 35 mm patella poly trial was placed.    The knee was brought to full extension with good flexion stability with the patella   tracking through the trochlea without application of pressure.     Next the femoral component was again assessed and determined to be seated and appropriately lateralized.  The femoral lug holes were drilled.  The femoral component was then removed.Tibial component was again assessed and felt to be seated and appropriately rotated with the medial third of the tubercle. The tibia was then drilled, and keel punched.  Given patient's soft bone and obesity, elected to use a short tibial stem to provide additional tibial fixation.   Final components were  opened and regular cement was mixed.      Final implants were then  cemented onto cleaned and dried cut surfaces of bone with the knee brought to extension with a 10 mm MC poly.  The knee was irrigated with sterile Betadine diluted in saline as well as pulse lavage normal saline.  The synovial lining was  then injected a dilute Exparel.      Once the cement had fully cured, excess cement was removed   throughout the knee.  I confirmed that I was satisfied with the range of   motion and stability, and the final 10 mm MC poly insert was chosen.  It was   placed into the knee.         The tourniquet had been let down at 90 minutes.  No significant   hemostasis was required.  The medial parapatellar arthrotomy was then reapproximated using #1 Stratafix sutures with the knee  in flexion.  The   remaining wound was closed with 0 stratafix, 2-0 Vicryl, and running 3-0 Monocryl.   The knee was cleaned, dried, dressed sterilely using Dermabond and   Aquacel dressing.  The patient was then  brought to  recovery room in stable condition, tolerating the procedure  well. There were no complications.   Post op recs: Per preop pulmonary recs: admit to step down unit, c/s pulmonology WB: WBAT Abx: ancef x23 hours post op Imaging: PACU xrays DVT prophylaxis: Eliquis 2.5mg  BID POD1-2 then resume full dose POD3 Follow up: 2 weeks after surgery for a wound check with Dr. Zachery Dakins at [facility].  Address: [address], [place], [place] [address]  Office Phone: [phone]  Charlies Constable, MD Orthopaedic Surgery

## 2021-04-28 NOTE — Anesthesia Postprocedure Evaluation (Signed)
Anesthesia Post Note  Patient: [name]  Procedure(s) Performed: TOTAL KNEE ARTHROPLASTY (Right: Knee)     Patient location during evaluation: PACU Anesthesia Type: Spinal Level of consciousness: awake and alert [name] management: [name] level controlled Vital Signs Assessment: post-procedure vital signs reviewed and stable Respiratory status: spontaneous breathing and respiratory function stable Cardiovascular status: blood pressure returned to baseline and stable Postop Assessment: spinal receding and no apparent nausea or vomiting Anesthetic complications: no   No notable events documented.  Last Vitals:  Vitals:   04/28/21 1130 04/28/21 1145  BP: 114/75 123/78  Pulse: 67 71  Resp: 18 15  Temp:    SpO2: 100% 100%    Last [name]:  Vitals:   04/28/21 1130  TempSrc:   PainSc: 0-No [name]                 Beryle Lathe

## 2021-04-28 NOTE — Progress Notes (Signed)
Have seen patient, doing very well post op. On room air.  Should use IS and wear CPAP if she is wearing at home. Do not see any pulmonary needs. Available PRN. No charge from my end.  Myrla Halsted MD PCCM

## 2021-04-28 NOTE — Transfer of Care (Signed)
Immediate Anesthesia Transfer of Care Note  Patient: [name]  Procedure(s) Performed: TOTAL KNEE ARTHROPLASTY (Right: Knee)  Patient Location: PACU  Anesthesia Type:Spinal  Level of Consciousness: awake and alert   Airway & Oxygen Therapy: Patient Spontanous Breathing and Patient connected to face mask oxygen  Post-op Assessment: Report given to RN and Post -op Vital signs reviewed and stable  Post vital signs: Reviewed and stable  Last Vitals:  Vitals Value Taken Time  BP 110/67 04/28/21 1017  Temp    Pulse 62 04/28/21 1017  Resp    SpO2 100 % 04/28/21 1017  Vitals shown include unvalidated device data.  Last [name]:  Vitals:   04/28/21 0617  TempSrc: Oral         Complications: No notable events documented.

## 2021-04-28 NOTE — Progress Notes (Signed)
Orthopedic Tech Progress Note Patient Details:  KUMARI SCULLEY 1950-10-18 [id number]  Ortho Devices Type of Ortho Device: Bone foam zero knee Ortho Device/Splint Location: RLE Ortho Device/Splint Interventions: Ordered, Application, Adjustment   Post Interventions Patient Tolerated: Well Instructions Provided: Adjustment of device, Care of device Pt placed in bone foam. Darleen Crocker 04/28/2021, 3:13 PM

## 2021-04-28 NOTE — Care Management Obs Status (Signed)
[place] NOTIFICATION   Patient Details  Name: [name] MRN: [id number] Date of Birth: 10/11/1950   Medicare Observation Status Notification Given:       Leeroy Cha, RN 04/28/2021, 1:45 PM

## 2021-04-28 NOTE — Anesthesia Procedure Notes (Signed)
Anesthesia Regional Block: Adductor canal block   Pre-Anesthetic Checklist: , timeout performed,  Correct Patient, Correct Site, Correct Laterality,  Correct Procedure, Correct Position, site marked,  Risks and benefits discussed,  Surgical consent,  Pre-op evaluation,  At surgeon's request and post-op pain management  Laterality: Right  Prep: chloraprep       Needles:  Injection technique: Single-shot  Needle Type: Echogenic Needle     Needle Length: 10cm  Needle Gauge: 21     Additional Needles:   Narrative:  Start time: 04/28/2021 7:01 AM End time: 04/28/2021 7:05 AM Injection made incrementally with aspirations every 5 mL.  Performed by: Personally  Anesthesiologist: Audry Pili, MD  Additional Notes: No pain on injection. No increased resistance to injection. Injection made in 5cc increments. Good needle visualization. Patient tolerated the procedure well.

## 2021-04-28 NOTE — Interval H&P Note (Signed)
The patient has been re-examined, and the chart reviewed, and there have been no interval changes to the documented history and physical.    The operative side was examined and the patient was confirmed to have. Sens DPN, SPN, TN intact, Motor EHL, ext, flex 5/5, and DP 2+, PT 2+, No significant edema.   The risks benefits and alternatives were discussed with the patient including but not limited to the risks of nonoperative treatment, versus surgical intervention including infection, bleeding, stiffness, leg length discrepancy, nerve injury, the need for revision surgery, hardware prominence, hardware failure, the need for hardware removal, blood clots, cardiopulmonary complications, morbidity, mortality, among others, and they were willing to proceed.  Consent was signed by myself and the patient.  Right knee was marked.

## 2021-04-29 ENCOUNTER — Other Ambulatory Visit (HOSPITAL_COMMUNITY): Payer: Self-pay

## 2021-04-29 DIAGNOSIS — G4733 Obstructive sleep apnea (adult) (pediatric): Secondary | ICD-10-CM | POA: Diagnosis not present

## 2021-04-29 DIAGNOSIS — E119 Type 2 diabetes mellitus without complications: Secondary | ICD-10-CM | POA: Diagnosis not present

## 2021-04-29 DIAGNOSIS — I509 Heart failure, unspecified: Secondary | ICD-10-CM | POA: Diagnosis not present

## 2021-04-29 DIAGNOSIS — Z79899 Other long term (current) drug therapy: Secondary | ICD-10-CM | POA: Diagnosis not present

## 2021-04-29 DIAGNOSIS — I4891 Unspecified atrial fibrillation: Secondary | ICD-10-CM | POA: Diagnosis not present

## 2021-04-29 DIAGNOSIS — M1711 Unilateral primary osteoarthritis, right knee: Secondary | ICD-10-CM | POA: Diagnosis not present

## 2021-04-29 DIAGNOSIS — Z9989 Dependence on other enabling machines and devices: Secondary | ICD-10-CM | POA: Diagnosis not present

## 2021-04-29 LAB — GLUCOSE, CAPILLARY
Glucose-Capillary: 220 mg/dL — ABNORMAL HIGH (ref 70–99)
Glucose-Capillary: 214 mg/dL — ABNORMAL HIGH (ref 70–99)
Glucose-Capillary: 227 mg/dL — ABNORMAL HIGH (ref 70–99)
Glucose-Capillary: 222 mg/dL — ABNORMAL HIGH (ref 70–99)

## 2021-04-29 LAB — BASIC METABOLIC PANEL
Anion gap: 5 (ref 5–15)
BUN: 22 mg/dL (ref 8–23)
CO2: 27 mmol/L (ref 22–32)
Calcium: 8.6 mg/dL — ABNORMAL LOW (ref 8.9–10.3)
Chloride: 101 mmol/L (ref 98–111)
Creatinine, Ser: 1.14 mg/dL — ABNORMAL HIGH (ref 0.44–1.00)
GFR, Estimated: 52 mL/min — ABNORMAL LOW (ref >60)
Glucose, Bld: 222 mg/dL — ABNORMAL HIGH (ref 70–99)
Potassium: 4 mmol/L (ref 3.5–5.1)
Sodium: 133 mmol/L — ABNORMAL LOW (ref 135–145)

## 2021-04-29 LAB — CBC
HCT: 37.4 % (ref 36.0–46.0)
Hemoglobin: 11.8 g/dL — ABNORMAL LOW (ref 12.0–15.0)
MCH: 28.9 pg (ref 26.0–34.0)
MCHC: 31.6 g/dL (ref 30.0–36.0)
MCV: 91.4 fL (ref 80.0–100.0)
Platelets: 183 10*3/uL (ref 150–400)
RBC: 4.09 MIL/uL (ref 3.87–5.11)
RDW: 13.6 % (ref 11.5–15.5)
WBC: 9.6 10*3/uL (ref 4.0–10.5)
nRBC: 0 % (ref 0.0–0.2)

## 2021-04-29 MED ORDER — OXYCODONE HCL 5 MG PO TABS
5.0000 mg | ORAL_TABLET | ORAL | 0 refills | Status: AC | PRN
  Filled 2021-04-29: qty 40, 7d supply, fill #0

## 2021-04-29 MED ORDER — APIXABAN 2.5 MG PO TABS
2.5000 mg | ORAL_TABLET | Freq: Two times a day (BID) | ORAL | 0 refills | Status: DC
  Filled 2021-04-29: qty 3, 2d supply, fill #0

## 2021-04-29 MED ORDER — METHOCARBAMOL 500 MG PO TABS
500.0000 mg | ORAL_TABLET | Freq: Three times a day (TID) | ORAL | 0 refills | Status: AC | PRN
  Filled 2021-04-29: qty 30, 10d supply, fill #0

## 2021-04-29 MED ORDER — APIXABAN 5 MG PO TABS: ORAL_TABLET | ORAL | 1 refills | Status: DC

## 2021-04-29 MED ORDER — ONDANSETRON HCL 4 MG PO TABS
4.0000 mg | ORAL_TABLET | Freq: Three times a day (TID) | ORAL | 0 refills | Status: AC | PRN
  Filled 2021-04-29: qty 14, 5d supply, fill #0

## 2021-04-29 NOTE — Progress Notes (Signed)
Physical Therapy Treatment Patient Details Name: [name] MRN: [id number] DOB: January 30, 1951 Today's Date: 04/29/2021   History of Present Illness 71 yo female s/p  R TKA  preop pulmonary recs admit pt to step down unit after surgery (no complications).  PMH: L TKA, obesity, CKD, DM, neuropathy, CHF, HTN, afib, cardiac arrest.    PT Comments    POD # 1 am session pr seen in ICU room 1224 Pt AxO x 3 very pleasant with Daughter who is a CNA also in room.  Daughter is helping till she goes back to work on Monday.  Assisted OOB to amb required increased time. General bed mobility comments: demonstarted and instructed on how to use a belt to self assit LE off bed.  Also hand Daughter "hands on" assist pt OOB. General transfer comment: 25% VC's on proper hand placement assisted OOB with daughter "hands on" with Therapist Instruction.  General Gait Details: 75% VC's on proper sequencing with increased time nearly 8 min to complete amb 12 feet.  Had Daughhter "hand on" assist pt with instruction on safe handling. Returned to room in recliner and applied ICE.  Treat ment session limited by increased pain, increased fatigue and mild nausea. Pt will need another PT session to increase mobility ability, practice one step she has to enter home and review HEP.    Recommendations for follow up therapy are one component of a multi-disciplinary discharge planning process, led by the attending physician.  Recommendations may be updated based on patient status, additional functional criteria and insurance authorization.  Follow Up Recommendations  Outpatient PT     Assistance Recommended at Discharge Intermittent Supervision/Assistance  Patient can return home with the following Help with stairs or ramp for entrance;Assist for transportation;Assistance with cooking/housework;A little help with walking and/or transfers;A little help with bathing/dressing/bathroom   Equipment Recommendations  Rolling walker (2  wheels)    Recommendations for Other Services       Precautions / Restrictions Precautions Precautions: Fall;Knee Precaution Comments: instructed no pillow under knee Restrictions Weight Bearing Restrictions: No Other Position/Activity Restrictions: WBAT     Mobility  Bed Mobility Overal bed mobility: Needs Assistance Bed Mobility: Supine to Sit, Sit to Supine     Supine to sit: Min assist Sit to supine: Mod assist   General bed mobility comments: demonstarted and instructed on how to use a belt to self assit LE off bed.  Also hand Daughter "hands on" assist pt OOB.    Transfers Overall transfer level: Needs assistance Equipment used: Rolling walker (2 wheels) Transfers: Sit to/from Stand Sit to Stand: Min assist           General transfer comment: 25% VC's on proper hand placement assisted OOB with daughter "hands on" with Therapist Instruction    Ambulation/Gait Ambulation/Gait assistance: Min guard, Min assist Gait Distance (Feet): 12 Feet Assistive device: Rolling walker (2 wheels) Gait Pattern/deviations: Step-to pattern, Decreased step length - right, Decreased step length - left, Decreased stance time - left Gait velocity: decreased     General Gait Details: 75% VC's on proper sequencing with increased time nearly 8 min to complete amb 12 feet.  Had Daughhter "hand on" assist pt with instruction on safe handling.   Stairs             Wheelchair Mobility    Modified Rankin (Stroke Patients Only)       Balance  Cognition Arousal/Alertness: Awake/alert Behavior During Therapy: WFL for tasks assessed/performed Overall Cognitive Status: Within Functional Limits for tasks assessed                                 General Comments: AxO x 3 very pleasant        Exercises      General Comments        Pertinent Vitals/Pain Pain Assessment Pain Assessment:  0-10 Pain Score: 10-Worst pain ever Pain Location: right knee Pain Descriptors / Indicators: Operative site guarding, Tender, Throbbing, Sharp Pain Intervention(s): Monitored during session, Premedicated before session, Repositioned, Ice applied    Home Living                          Prior Function            PT Goals (current goals can now be found in the care plan section) Progress towards PT goals: Progressing toward goals    Frequency    7X/week      PT Plan Current plan remains appropriate    Co-evaluation              AM-PAC PT "6 Clicks" Mobility   Outcome Measure  Help needed turning from your back to your side while in a flat bed without using bedrails?: A Little Help needed moving from lying on your back to sitting on the side of a flat bed without using bedrails?: A Little Help needed moving to and from a bed to a chair (including a wheelchair)?: A Little Help needed standing up from a chair using your arms (e.g., wheelchair or bedside chair)?: A Little Help needed to walk in hospital room?: A Little Help needed climbing 3-5 steps with a railing? : A Lot 6 Click Score: 17    End of Session Equipment Utilized During Treatment: Gait belt Activity Tolerance: No increased pain;Patient limited by fatigue;Other (comment) (mild nausea) Patient left: in chair;with family/visitor present;with call bell/phone within reach Nurse Communication: Mobility status (pt will need another PT session this afternoon) PT Visit Diagnosis: Other abnormalities of gait and mobility (R26.89);Difficulty in walking, not elsewhere classified (R26.2)     Time: [id number] PT Time Calculation (min) (ACUTE ONLY): 36 min  Charges:  $Gait Training: 8-22 mins $Therapeutic Activity: 8-22 mins                     Rica Koyanagi  PTA Acute  Rehabilitation Services Pager      [phone] Office      [phone]

## 2021-04-29 NOTE — Discharge Instructions (Signed)
INSTRUCTIONS AFTER JOINT REPLACEMENT  ° °Remove items at home which could result in a fall. This includes throw rugs or furniture in walking pathways °ICE to the affected joint every three hours while awake for 30 minutes at a time, for at least the first 3-5 days, and then as needed for pain and swelling.  Continue to use ice for pain and swelling. You may notice swelling that will progress down to the foot and ankle.  This is normal after surgery.  Elevate your leg when you are not up walking on it.   °Continue to use the breathing machine you got in the hospital (incentive spirometer) which will help keep your temperature down.  It is common for your temperature to cycle up and down following surgery, especially at night when you are not up moving around and exerting yourself.  The breathing machine keeps your lungs expanded and your temperature down. ° ° °DIET:  As you were doing prior to hospitalization, we recommend a well-balanced diet. ° °DRESSING / WOUND CARE / SHOWERING ° °Keep the surgical dressing until follow up.  The dressing is water proof, so you can shower without any extra covering.  IF THE DRESSING FALLS OFF or the wound gets wet inside, change the dressing with sterile gauze.  Please use good hand washing techniques before changing the dressing.  Do not use any lotions or creams on the incision until instructed by your surgeon.   ° °ACTIVITY ° °Increase activity slowly as tolerated, but follow the weight bearing instructions below.   °No driving for 6 weeks or until further direction given by your physician.  You cannot drive while taking narcotics.  °No lifting or carrying greater than 10 lbs. until further directed by your surgeon. °Avoid periods of inactivity such as sitting longer than an hour when not asleep. This helps prevent blood clots.  °You may return to work once you are authorized by your doctor.  ° ° ° °WEIGHT BEARING  ° °Weight bearing as tolerated with assist device (walker, cane,  etc) as directed, use it as long as suggested by your surgeon or therapist, typically at least 4-6 weeks. ° ° °EXERCISES ° °Results after joint replacement surgery are often greatly improved when you follow the exercise, range of motion and muscle strengthening exercises prescribed by your doctor. Safety measures are also important to protect the joint from further injury. Any time any of these exercises cause you to have increased pain or swelling, decrease what you are doing until you are comfortable again and then slowly increase them. If you have problems or questions, call your caregiver or physical therapist for advice.  ° °Rehabilitation is important following a joint replacement. After just a few days of immobilization, the muscles of the leg can become weakened and shrink (atrophy).  These exercises are designed to build up the tone and strength of the thigh and leg muscles and to improve motion. Often times heat used for twenty to thirty minutes before working out will loosen up your tissues and help with improving the range of motion but do not use heat for the first two weeks following surgery (sometimes heat can increase post-operative swelling).  ° °These exercises can be done on a training (exercise) mat, on the floor, on a table or on a bed. Use whatever works the best and is most comfortable for you.    Use music or television while you are exercising so that the exercises are a pleasant break in your   day. This will make your life better with the exercises acting as a break in your routine that you can look forward to.   Perform all exercises about fifteen times, three times per day or as directed.  You should exercise both the operative leg and the other leg as well.  Exercises include:   Quad Sets - Tighten up the muscle on the front of the thigh (Quad) and hold for 5-10 seconds.   Straight Leg Raises - With your knee straight (if you were given a brace, keep it on), lift the leg to 60  degrees, hold for 3 seconds, and slowly lower the leg.  Perform this exercise against resistance later as your leg gets stronger.  Leg Slides: Lying on your back, slowly slide your foot toward your buttocks, bending your knee up off the floor (only go as far as is comfortable). Then slowly slide your foot back down until your leg is flat on the floor again.  Angel Wings: Lying on your back spread your legs to the side as far apart as you can without causing discomfort.  Hamstring Strength:  Lying on your back, push your heel against the floor with your leg straight by tightening up the muscles of your buttocks.  Repeat, but this time bend your knee to a comfortable angle, and push your heel against the floor.  You may put a pillow under the heel to make it more comfortable if necessary.   A rehabilitation program following joint replacement surgery can speed recovery and prevent re-injury in the future due to weakened muscles. Contact your doctor or a physical therapist for more information on knee rehabilitation.    CONSTIPATION  Constipation is defined medically as fewer than three stools per week and severe constipation as less than one stool per week.  Even if you have a regular bowel pattern at home, your normal regimen is likely to be disrupted due to multiple reasons following surgery.  Combination of anesthesia, postoperative narcotics, change in appetite and fluid intake all can affect your bowels.   YOU MUST use at least one of the following options; they are listed in order of increasing strength to get the job done.  They are all available over the counter, and you may need to use some, POSSIBLY even all of these options:    Drink plenty of fluids (prune juice may be helpful) and high fiber foods Colace 100 mg by mouth twice a day  Senokot for constipation as directed and as needed Dulcolax (bisacodyl), take with full glass of water  Miralax (polyethylene glycol) once or twice a day as  needed.  If you have tried all these things and are unable to have a bowel movement in the first 3-4 days after surgery call either your surgeon or your primary doctor.    If you experience loose stools or diarrhea, hold the medications until you stool forms back up.  If your symptoms do not get better within 1 week or if they get worse, check with your doctor.  If you experience "the worst abdominal pain ever" or develop nausea or vomiting, please contact the office immediately for further recommendations for treatment.   ITCHING:  If you experience itching with your medications, try taking only a single pain pill, or even half a pain pill at a time.  You can also use Benadryl over the counter for itching or also to help with sleep.   TED HOSE STOCKINGS:  Use stockings on both  legs until for at least 2 weeks or as directed by physician office. They may be removed at night for sleeping.  MEDICATIONS:  See your medication summary on the After Visit Summary that nursing will review with you.  You may have some home medications which will be placed on hold until you complete the course of blood thinner medication.  It is important for you to complete the blood thinner medication as prescribed.   Blood clot prevention (DVT Prophylaxis): After surgery you are at an increased risk for a blood clot. you were prescribed a blood thinner, Eliquis, take 2.5mg  for the first and second day after surgery, then take your full 5 mg starting the third day after surgery (1/19) to help reduce your risk of getting a blood clot. This will help prevent a blood clot. Signs of a pulmonary embolus (blood clot in the lungs) include sudden short of breath, feeling lightheaded or dizzy, chest pain with a deep breath, rapid pulse rapid breathing. Signs of a blood clot in your arms or legs include new unexplained swelling and cramping, warm, red or darkened skin around the painful area. Please call the office or 911 right away if  these signs or symptoms develop.  PRECAUTIONS:  If you experience chest pain or shortness of breath - call 911 immediately for transfer to the hospital emergency department.   If you develop a fever greater that 101 F, purulent drainage from wound, increased redness or drainage from wound, foul odor from the wound/dressing, or calf pain - CONTACT YOUR SURGEON.                                                   FOLLOW-UP APPOINTMENTS:  If you do not already have a post-op appointment, please call the office for an appointment to be seen by your surgeon.  Guidelines for how soon to be seen are listed in your After Visit Summary, but are typically between 2-3 weeks after surgery.  OTHER INSTRUCTIONS:   Knee Replacement:  Do not place pillow under knee, focus on keeping the knee straight while resting. CPM instructions: 0-90 degrees, 2 hours in the morning, 2 hours in the afternoon, and 2 hours in the evening. Place foam block, curve side up under heel at all times except when in CPM or when walking.  DO NOT modify, tear, cut, or change the foam block in any way.  POST-OPERATIVE OPIOID TAPER INSTRUCTIONS: It is important to wean off of your opioid medication as soon as possible. If you do not need pain medication after your surgery it is ok to stop day one. Opioids include: Codeine, Hydrocodone(Norco, Vicodin), Oxycodone(Percocet, oxycontin) and hydromorphone amongst others.  Long term and even short term use of opiods can cause: Increased pain response Dependence Constipation Depression Respiratory depression And more.  Withdrawal symptoms can include Flu like symptoms Nausea, vomiting And more Techniques to manage these symptoms Hydrate well Eat regular healthy meals Stay active Use relaxation techniques(deep breathing, meditating, yoga) Do Not substitute Alcohol to help with tapering If you have been on opioids for less than two weeks and do not have pain than it is ok to stop all  together.  Plan to wean off of opioids This plan should start within one week post op of your joint replacement. Maintain the same interval or time between taking each  dose and first decrease the dose.  Cut the total daily intake of opioids by one tablet each day Next start to increase the time between doses. The last dose that should be eliminated is the evening dose.   MAKE SURE YOU:  Understand these instructions.  Get help right away if you are not doing well or get worse.    Thank you for letting us be a part of your medical care team.  It is a privilege we respect greatly.  We hope these instructions will help you stay on track for a fast and full recovery!

## 2021-04-29 NOTE — Progress Notes (Signed)
Physical Therapy Treatment Patient Details Name: PIERSON HUNNICUTT MRN: [id number] DOB: 02-02-1951 Today's Date: 04/29/2021   History of Present Illness 71 yo female s/p  R TKA  preop pulmonary recs admit pt to step down unit after surgery (no complications).  PMH: L TKA, obesity, CKD, DM, neuropathy, CHF, HTN, afib, cardiac arrest.    PT Comments    POD # 1 pm session Pt did NOT meet her mobility goal to safely D/C to home today.  Pt was only able to barely amb 8 feet this afternoon limited by pain level and fatigue.  Pt also present with a little impaired alertness/inability to retain and recall instructions on safe amb with walker. Pt will need another day to address her mobility and practice the one step/curb/porch she has to enter home. Pt plans to have daughter who is a CNA assist her at home.  Walker was delivered and in room.     Recommendations for follow up therapy are one component of a multi-disciplinary discharge planning process, led by the attending physician.  Recommendations may be updated based on patient status, additional functional criteria and insurance authorization.  Follow Up Recommendations  Outpatient PT     Assistance Recommended at Discharge Intermittent Supervision/Assistance  Patient can return home with the following Help with stairs or ramp for entrance;Assist for transportation;Assistance with cooking/housework;A little help with walking and/or transfers;A little help with bathing/dressing/bathroom   Equipment Recommendations  Rolling walker (2 wheels)    Recommendations for Other Services       Precautions / Restrictions Precautions Precautions: Fall;Knee Precaution Comments: instructed no pillow under knee Restrictions Weight Bearing Restrictions: No Other Position/Activity Restrictions: WBAT     Mobility  Bed Mobility Overal bed mobility: Needs Assistance Bed Mobility: Sit to Supine     Supine to sit: Min assist Sit to supine: Max assist    General bed mobility comments: MaxAssist back to bed due to increased pain level and fatigue    Transfers Overall transfer level: Needs assistance Equipment used: Rolling walker (2 wheels) Transfers: Sit to/from Stand Sit to Stand: Min assist, +2 safety/equipment           General transfer comment: 25% VC's on proper hand placement assisted OOB with daughter "hands on" with Therapist Instruction    Ambulation/Gait Ambulation/Gait assistance: Min guard, Min assist Gait Distance (Feet): 8 Feet Assistive device: Rolling walker (2 wheels) Gait Pattern/deviations: Step-to pattern, Decreased step length - right, Decreased step length - left, Decreased stance time - left Gait velocity: decreased     General Gait Details: 75% VC's on proper sequencing with increased time nearly 8 min to complete amb 8 feet.  Decreased amb distance due to 8/10 pain level and MAX c/o fatigue.   Stairs             Wheelchair Mobility    Modified Rankin (Stroke Patients Only)       Balance                                            Cognition Arousal/Alertness: Awake/alert Behavior During Therapy: WFL for tasks assessed/performed Overall Cognitive Status: Within Functional Limits for tasks assessed                                 General Comments: AxO x 3  very pleasant but session limited by pain and fatigue        Exercises      General Comments        Pertinent Vitals/Pain Pain Assessment Pain Assessment: 0-10 Pain Score: 8  Pain Location: right knee Pain Descriptors / Indicators: Operative site guarding, Tender, Throbbing, Sharp Pain Intervention(s): Monitored during session, Patient requesting pain meds-RN notified, Ice applied, RN gave pain meds during session    Home Living                          Prior Function            PT Goals (current goals can now be found in the care plan section) Progress towards PT goals:  Progressing toward goals    Frequency    7X/week      PT Plan Current plan remains appropriate    Co-evaluation              AM-PAC PT "6 Clicks" Mobility   Outcome Measure  Help needed turning from your back to your side while in a flat bed without using bedrails?: A Little Help needed moving from lying on your back to sitting on the side of a flat bed without using bedrails?: A Little Help needed moving to and from a bed to a chair (including a wheelchair)?: A Little Help needed standing up from a chair using your arms (e.g., wheelchair or bedside chair)?: A Little Help needed to walk in hospital room?: A Little Help needed climbing 3-5 steps with a railing? : A Lot 6 Click Score: 17    End of Session Equipment Utilized During Treatment: Gait belt Activity Tolerance: No increased pain;Patient limited by fatigue Patient left: in bed;with bed alarm set Nurse Communication: Mobility status (pt did NOT meet her mobility goal to safely D/C to home today) PT Visit Diagnosis: Other abnormalities of gait and mobility (R26.89);Difficulty in walking, not elsewhere classified (R26.2)     Time: [id number] PT Time Calculation (min) (ACUTE ONLY): 26 min  Charges:  $Gait Training: 8-22 mins $Therapeutic Activity: 8-22 mins                     {Javone Ybanez  PTA Acute  Rehabilitation Services Pager      [phone] Office      [phone]

## 2021-04-29 NOTE — TOC Transition Note (Signed)
Transition of Care [facility]) - CM/SW Discharge Note   Patient Details  Name: [name] MRN: [id number] Date of Birth: 1951/01/25  [facility] CM/SW Contact:  Darleene Cleaver, LCSW Phone Number: 04/29/2021, 9:15 AM   Clinical Narrative:     Patient's outpatient therapy was prearranged with physician.  Patient discharging back home and will go to outpatient PT at [facility].  Patient already has equipment at home.   Final next level of care: OP Rehab Barriers to Discharge: Barriers Resolved   Patient Goals and CMS Choice Patient states their goals for this hospitalization and ongoing recovery are:: To return home and go to outpatient rehab.      Discharge Placement                       Discharge Plan and Services                DME Arranged: CPM DME Agency: [employer]                  Social Determinants of Health (SDOH) Interventions     Readmission Risk Interventions No flowsheet data found.

## 2021-04-29 NOTE — Progress Notes (Signed)
° ° ° °  Subjective:  Patient reports pain as well controlled.  Daughter at bedside.  Denies distal numbness and tingling.  Worked with physical therapy yesterday but spinal took a while to wear off and still had some buckling.  Seen by pulmonary yesterday evening who had no concerns.  Discussed with overnight nurse who also had no respiratory concerns.  Patient used her CPAP overnight.  Discussed plan for discharge home later today if able to clear physical therapy.  Objective:   VITALS:   Vitals:   04/29/21 0400 04/29/21 0500 04/29/21 0600 04/29/21 0700  BP: (!) 121/57 (!) 114/59 (!) 119/58 100/80  Pulse: 67 71 72 65  Resp: 17 15 14 14   Temp: 98 F (36.7 C)     TempSrc: Oral     SpO2: 97% 99% 98% 98%  Weight:      Height:        Sensation intact distally Intact pulses distally Dorsiflexion/Plantar flexion intact Incision: dressing C/D/I No cellulitis present Compartment soft   Lab Results  Component Value Date   WBC 4.8 04/15/2021   HGB 13.6 04/15/2021   HCT 43.0 04/15/2021   MCV 92.7 04/15/2021   PLT 203 04/15/2021   BMET    Component Value Date/Time   NA 138 04/15/2021 0845   NA 142 09/19/2020 0944   K 4.6 04/15/2021 0845   CL 106 04/15/2021 0845   CO2 26 04/15/2021 0845   GLUCOSE 122 (H) 04/15/2021 0845   BUN 21 04/15/2021 0845   BUN 23 09/19/2020 0944   CREATININE 1.26 (H) 04/15/2021 0845   CREATININE 1.23 (H) 11/23/2016 0812   CALCIUM 8.7 (L) 04/15/2021 0845   EGFR 44 (L) 09/19/2020 0944   GFRNONAA 46 (L) 04/15/2021 0845   GFRNONAA 54 (L) 09/02/2015 0918     Assessment/Plan: 1 Day Post-Op   Principal Problem:   Primary localized osteoarthritis of right knee Active Problems:   Type 2 diabetes mellitus with diabetic neuropathy, with long-term current use of insulin (HCC)   Essential hypertension   PAF (paroxysmal atrial fibrillation) (HCC)   OSA on CPAP   Diabetic neuropathy (HCC)   Asthma, moderate persistent   ICD (St. Jude Protecta  dual-chamber),secondary prevention (VF arrest) January 2012   Obesity (BMI 30-39.9)   Chronic diastolic heart failure (HCC)   GERD (gastroesophageal reflux disease)   Chronic renal impairment, stage 3a (HCC)   S/P TKR (total knee replacement), right   Localized osteoarthritis of right knee  Right TKA 1/16   Post op recs: Per preop pulmonary recs: admit to step down unit, c/s pulmonology WB: WBAT Abx: ancef x23 hours post op Imaging: PACU xrays DVT prophylaxis: Eliquis 2.5mg  BID POD1-2 then resume full dose POD3 Follow up: 2 weeks after surgery for a wound check with Dr. Zachery Dakins at [facility].  Address: [address] [place], [place], [place] [address]  Office Phone: [phone]    Willaim Sheng 04/29/2021, 7:25 AM   Charlies Constable, MD  Contact information:   [phone] 7am-5pm epic message Dr. Zachery Dakins, or call office for patient follow up: [phone] After hours and holidays please check [email or web address] for group call information for [facility]

## 2021-04-30 ENCOUNTER — Encounter (HOSPITAL_COMMUNITY): Payer: Self-pay | Admitting: Orthopedic Surgery

## 2021-04-30 DIAGNOSIS — E119 Type 2 diabetes mellitus without complications: Secondary | ICD-10-CM | POA: Diagnosis not present

## 2021-04-30 DIAGNOSIS — M1711 Unilateral primary osteoarthritis, right knee: Secondary | ICD-10-CM | POA: Diagnosis not present

## 2021-04-30 DIAGNOSIS — G4733 Obstructive sleep apnea (adult) (pediatric): Secondary | ICD-10-CM | POA: Diagnosis not present

## 2021-04-30 DIAGNOSIS — Z96651 Presence of right artificial knee joint: Secondary | ICD-10-CM | POA: Diagnosis not present

## 2021-04-30 DIAGNOSIS — J45909 Unspecified asthma, uncomplicated: Secondary | ICD-10-CM | POA: Diagnosis not present

## 2021-04-30 DIAGNOSIS — J449 Chronic obstructive pulmonary disease, unspecified: Secondary | ICD-10-CM | POA: Diagnosis not present

## 2021-04-30 DIAGNOSIS — Z79899 Other long term (current) drug therapy: Secondary | ICD-10-CM | POA: Diagnosis not present

## 2021-04-30 DIAGNOSIS — I4891 Unspecified atrial fibrillation: Secondary | ICD-10-CM | POA: Diagnosis not present

## 2021-04-30 DIAGNOSIS — I509 Heart failure, unspecified: Secondary | ICD-10-CM | POA: Diagnosis not present

## 2021-04-30 DIAGNOSIS — Z9989 Dependence on other enabling machines and devices: Secondary | ICD-10-CM | POA: Diagnosis not present

## 2021-04-30 LAB — CBC
HCT: 35.3 % — ABNORMAL LOW (ref 36.0–46.0)
Hemoglobin: 11.2 g/dL — ABNORMAL LOW (ref 12.0–15.0)
MCH: 29.2 pg (ref 26.0–34.0)
MCHC: 31.7 g/dL (ref 30.0–36.0)
MCV: 91.9 fL (ref 80.0–100.0)
Platelets: 174 10*3/uL (ref 150–400)
RBC: 3.84 MIL/uL — ABNORMAL LOW (ref 3.87–5.11)
RDW: 14 % (ref 11.5–15.5)
WBC: 10.4 10*3/uL (ref 4.0–10.5)
nRBC: 0 % (ref 0.0–0.2)

## 2021-04-30 LAB — GLUCOSE, CAPILLARY
Glucose-Capillary: 209 mg/dL — ABNORMAL HIGH (ref 70–99)
Glucose-Capillary: 208 mg/dL — ABNORMAL HIGH (ref 70–99)

## 2021-04-30 NOTE — Progress Notes (Signed)
Physical Therapy Treatment Patient Details Name: [name] MRN: [id number] DOB: November 20, 1950 Today's Date: 04/30/2021   History of Present Illness 71 yo female s/p  R TKA  preop pulmonary recs admit pt to step down unit after surgery (no complications).  PMH: L TKA, obesity, CKD, DM, neuropathy, CHF, HTN, afib, cardiac arrest.    PT Comments    Pt progressing slowly. Dtr feels pt is overmedicated, discussed with RN. Will see again in pm. Encouraged pt to sit up in chair and to eat (dtr reports she is not eating). Has 24 hour care at home, will have caregiver after dtr returns to work; since pt is overall min assist, if she is able to ascend one step this pm, she should be able to d/c home    Recommendations for follow up therapy are one component of a multi-disciplinary discharge planning process, led by the attending physician.  Recommendations may be updated based on patient status, additional functional criteria and insurance authorization.  Follow Up Recommendations  Follow physician's recommendations for discharge plan and follow up therapies (OPPT)     Assistance Recommended at Discharge Frequent or constant Supervision/Assistance  Patient can return home with the following Assist for transportation;Help with stairs or ramp for entrance;Assistance with cooking/housework;A lot of help with walking and/or transfers;A lot of help with bathing/dressing/bathroom   Equipment Recommendations  Rolling walker (2 wheels)    Recommendations for Other Services       Precautions / Restrictions Precautions Precautions: Fall;Knee Restrictions Weight Bearing Restrictions: No     Mobility  Bed Mobility   Bed Mobility: Supine to Sit     Supine to sit: Min assist     General bed mobility comments: incr time, step by step  verbal cues for sequencing. assist to bring RLE off bed, pt attempting to use gait belt as leg lifter    Transfers Overall transfer level: Needs  assistance Equipment used: Rolling walker (2 wheels) Transfers: Sit to/from Stand, Bed to chair/wheelchair/BSC Sit to Stand: Min assist, +2 physical assistance, +2 safety/equipment   Step pivot transfers: Min assist, +2 physical assistance, +2 safety/equipment       General transfer comment: incr time, multi-modal cues for sequencing, hand placement, RW position and foot position    Ambulation/Gait Ambulation/Gait assistance: Min guard, Min assist Gait Distance (Feet): 16 Feet Assistive device: Rolling walker (2 wheels) Gait Pattern/deviations: Step-to pattern, Decreased step length - right, Decreased step length - left, Decreased stance time - left       General Gait Details: constant cues for seqeunce, RW position, participation; requires excessive time to amb short distance   Stairs             Wheelchair Mobility    Modified Rankin (Stroke Patients Only)       Balance                                            Cognition Arousal/Alertness: Awake/alert Behavior During Therapy: WFL for tasks assessed/performed Overall Cognitive Status: Within Functional Limits for tasks assessed                                          Exercises Total Joint Exercises Ankle Circles/Pumps: AROM, Both, 10 reps Quad Sets: AROM, 5 reps Straight  Leg Raises: 5 reps, AAROM, Right    General Comments        Pertinent Vitals/Pain Pain Assessment Pain Assessment: 0-10 Pain Score: 4  Pain Location: right knee Pain Descriptors / Indicators: Guarding, Grimacing, Sore Pain Intervention(s): Limited activity within patient's tolerance, Monitored during session, Premedicated before session, Ice applied    Home Living                          Prior Function            PT Goals (current goals can now be found in the care plan section) Acute Rehab PT Goals Patient Stated Goal: have less pain with moving around PT Goal Formulation:  With patient Time For Goal Achievement: 05/05/21 Potential to Achieve Goals: Good Progress towards PT goals: Progressing toward goals    Frequency    7X/week      PT Plan Current plan remains appropriate    Co-evaluation              AM-PAC PT "6 Clicks" Mobility   Outcome Measure  Help needed turning from your back to your side while in a flat bed without using bedrails?: A Little Help needed moving from lying on your back to sitting on the side of a flat bed without using bedrails?: A Little Help needed moving to and from a bed to a chair (including a wheelchair)?: A Lot Help needed standing up from a chair using your arms (e.g., wheelchair or bedside chair)?: A Lot Help needed to walk in hospital room?: A Lot Help needed climbing 3-5 steps with a railing? : Total 6 Click Score: 13    End of Session Equipment Utilized During Treatment: Gait belt Activity Tolerance: Patient limited by fatigue Patient left: in chair;with call bell/phone within reach;with chair alarm set Nurse Communication: Mobility status PT Visit Diagnosis: Other abnormalities of gait and mobility (R26.89);Difficulty in walking, not elsewhere classified (R26.2)     Time: 4098-1191 PT Time Calculation (min) (ACUTE ONLY): 40 min  Charges:  $Gait Training: 8-22 mins $Therapeutic Activity: 23-37 mins                     Delice Bison, PT  Acute Rehab Dept ([facility]) [phone] Pager [phone]  04/30/2021    [facility] 04/30/2021, 12:48 PM

## 2021-04-30 NOTE — Progress Notes (Signed)
04/30/21 1400  PT Visit Information  Last PT Received On 04/30/21  Assistance Needed +1  Pt continues to make steady progress. Did well with stair practice. Ready to d/c home from PT standpoint with family assist.   History of Present Illness 71 yo female s/p  R TKA  preop pulmonary recs admit pt to step down unit after surgery (no complications).  PMH: L TKA, obesity, CKD, DM, neuropathy, CHF, HTN, afib, cardiac arrest.  Subjective Data  Patient Stated Goal have less pain with moving around  Precautions  Precautions Fall;Knee  Restrictions  Weight Bearing Restrictions No  Other Position/Activity Restrictions WBAT  Pain Assessment  Pain Assessment 0-10  Pain Score 4  Pain Location right knee  Pain Descriptors / Indicators Guarding;Grimacing;Sore  Pain Intervention(s) Limited activity within patient's tolerance;Monitored during session;Premedicated before session;Repositioned;Ice applied  Cognition  Arousal/Alertness Awake/alert  Behavior During Therapy WFL for tasks assessed/performed  Overall Cognitive Status Within Functional Limits for tasks assessed  General Comments more alert and converse this pm  Bed Mobility  Bed Mobility Supine to Sit  Supine to sit Min assist  General bed mobility comments incr time, step by step  verbal cues for sequencing. assist to bring RLE off bed, pt attempting to use gait belt as leg lifter  Transfers  Overall transfer level Needs assistance  Equipment used Rolling walker (2 wheels)  Transfers Sit to/from Stand;Bed to chair/wheelchair/BSC  Sit to Stand Min guard;Min assist  Bed to/from chair/wheelchair/BSC transfer type: Step pivot  Step pivot transfers Min assist;Min guard  General transfer comment incr time, multi-modal cues for sequencing, hand placement, RW position and foot position  Ambulation/Gait  Ambulation/Gait assistance Min guard;Min assist  Gait Distance (Feet) 15 Feet  Assistive device Rolling walker (2 wheels)  Gait  Pattern/deviations Step-to pattern;Decreased step length - right;Decreased step length - left;Decreased stance time - left  General Gait Details verbal cues for seqeunce, RW position, participation; requires incr  time to amb short distance  Stairs Yes  Stairs assistance Min guard  Stair Management No rails;Step to pattern;Backwards;With walker  Number of Stairs 1  General stair comments multi-modal cues for sequence. no LOB pt steady, min/guard for balance and safety, assist to brign RW up on to step. dtr present for stair training  PT - End of Session  Equipment Utilized During Treatment Gait belt  Activity Tolerance Patient limited by fatigue  Patient left in chair;with call bell/phone within reach;with chair alarm set;with family/visitor present  Nurse Communication Mobility status   PT - Assessment/Plan  PT Plan Current plan remains appropriate  PT Visit Diagnosis Other abnormalities of gait and mobility (R26.89);Difficulty in walking, not elsewhere classified (R26.2)  PT Frequency (ACUTE ONLY) 7X/week  Follow Up Recommendations Follow physician's recommendations for discharge plan and follow up therapies (OPPT)  Assistance recommended at discharge Frequent or constant Supervision/Assistance  Patient can return home with the following Assist for transportation;Help with stairs or ramp for entrance;Assistance with cooking/housework;A lot of help with walking and/or transfers;A lot of help with bathing/dressing/bathroom  PT equipment Rolling walker (2 wheels)  AM-PAC PT "6 Clicks" Mobility Outcome Measure (Version 2)  Help needed turning from your back to your side while in a flat bed without using bedrails? 3  Help needed moving from lying on your back to sitting on the side of a flat bed without using bedrails? 3  Help needed moving to and from a bed to a chair (including a wheelchair)? 3  Help needed standing up from  a chair using your arms (e.g., wheelchair or bedside chair)? 3  Help  needed to walk in hospital room? 3  Help needed climbing 3-5 steps with a railing?  1  6 Click Score 16  Consider Recommendation of Discharge To: Home with [facility]  PT Goal Progression  Progress towards PT goals Progressing toward goals  Acute Rehab PT Goals  PT Goal Formulation With patient  Time For Goal Achievement 05/05/21  Potential to Achieve Goals Good  PT Time Calculation  PT Start Time (ACUTE ONLY) 1342  PT Stop Time (ACUTE ONLY) 1402  PT Time Calculation (min) (ACUTE ONLY) 20 min  PT General Charges  $$ ACUTE PT VISIT 1 Visit  PT Treatments  $Gait Training 8-22 mins

## 2021-04-30 NOTE — Plan of Care (Signed)
?  Problem: Activity: ?Goal: Risk for activity intolerance will decrease ?Outcome: Progressing ?  ?Problem: Safety: ?Goal: Ability to remain free from injury will improve ?Outcome: Progressing ?  ?Problem: Pain Managment: ?Goal: General experience of comfort will improve ?Outcome: Progressing ?  ?

## 2021-04-30 NOTE — Progress Notes (Signed)
Discharge package printed and instructions given to patient and daughter. Verbalize understanding. 

## 2021-04-30 NOTE — Plan of Care (Signed)
  Problem: Activity: Goal: Risk for activity intolerance will decrease Outcome: Progressing   Problem: Nutrition: Goal: Adequate nutrition will be maintained Outcome: Progressing   

## 2021-04-30 NOTE — Progress Notes (Signed)
° ° ° °  Subjective:  Patient worked with physical therapy twice yesterday.  She was able to walk but limited by pain after the perioperative nerve block had worn off.  She feels like the pain is better controlled this morning.  She denies distal numbness and tingling.  She did well on the floor overnight.  No shortness of breath or chest pain.  Anticipate discharge home later today once cleared by physical therapy.   Objective:   VITALS:   Vitals:   04/29/21 2055 04/29/21 2157 04/30/21 0547 04/30/21 0825  BP: (!) 146/86 137/72 (!) 121/59   Pulse: 91 83 (!) 104   Resp: 20 16 16    Temp:  99.5 F (37.5 C) 98.5 F (36.9 C)   TempSrc:  Oral Oral   SpO2: 100% 92% 95% 96%  Weight:      Height:        Sensation intact distally Intact pulses distally Dorsiflexion/Plantar flexion intact Incision: dressing C/D/I No cellulitis present Compartment soft   Lab Results  Component Value Date   WBC 10.4 04/30/2021   HGB 11.2 (L) 04/30/2021   HCT 35.3 (L) 04/30/2021   MCV 91.9 04/30/2021   PLT 174 04/30/2021   BMET    Component Value Date/Time   NA 133 (L) 04/29/2021 0752   NA 142 09/19/2020 0944   K 4.0 04/29/2021 0752   CL 101 04/29/2021 0752   CO2 27 04/29/2021 0752   GLUCOSE 222 (H) 04/29/2021 0752   BUN 22 04/29/2021 0752   BUN 23 09/19/2020 0944   CREATININE 1.14 (H) 04/29/2021 0752   CREATININE 1.23 (H) 11/23/2016 0812   CALCIUM 8.6 (L) 04/29/2021 0752   EGFR 44 (L) 09/19/2020 0944   GFRNONAA 52 (L) 04/29/2021 0752   GFRNONAA 54 (L) 09/02/2015 0918     Assessment/Plan: 2 Days Post-Op   Principal Problem:   Primary localized osteoarthritis of right knee Active Problems:   Type 2 diabetes mellitus with diabetic neuropathy, with long-term current use of insulin (HCC)   Essential hypertension   PAF (paroxysmal atrial fibrillation) (HCC)   OSA on CPAP   Diabetic neuropathy (HCC)   Asthma, moderate persistent   ICD (St. Jude Protecta dual-chamber),secondary  prevention (VF arrest) January 2012   Obesity (BMI 30-39.9)   Chronic diastolic heart failure (HCC)   GERD (gastroesophageal reflux disease)   Chronic renal impairment, stage 3a (HCC)   S/P TKR (total knee replacement), right   Localized osteoarthritis of right knee  Right TKA 1/16   Post op recs: Per preop pulmonary recs: admit to step down unit, c/s pulmonology WB: WBAT Abx: ancef x23 hours post op Imaging: PACU xrays DVT prophylaxis: Eliquis 2.5mg  BID POD1-2 then resume full dose POD3 Follow up: 2 weeks after surgery for a wound check with Dr. Zachery Dakins at [facility].  Address: [address] [place], [place], [place] [address]  Office Phone: [phone]    Willaim Sheng 04/30/2021, 8:49 AM   Charlies Constable, MD  Contact information:   [phone] 7am-5pm epic message Dr. Zachery Dakins, or call office for patient follow up: [phone] After hours and holidays please check [email or web address] for group call information for [facility]

## 2021-04-30 NOTE — Discharge Summary (Signed)
Physician Discharge Summary  Patient ID: [name] MRN: [id number] DOB/AGE: [birth year] 70 y.o.  Admit date: 04/28/2021 Discharge date: 04/30/2021  Admission Diagnoses:  Primary localized osteoarthritis of right knee  Discharge Diagnoses:  Principal Problem:   Primary localized osteoarthritis of right knee Active Problems:   Type 2 diabetes mellitus with diabetic neuropathy, with long-term current use of insulin (HCC)   Essential hypertension   PAF (paroxysmal atrial fibrillation) (HCC)   OSA on CPAP   Diabetic neuropathy (HCC)   Asthma, moderate persistent   ICD (St. Jude Protecta dual-chamber),secondary prevention (VF arrest) January 2012   Obesity (BMI 30-39.9)   Chronic diastolic heart failure (HCC)   GERD (gastroesophageal reflux disease)   Chronic renal impairment, stage 3a (HCC)   S/P TKR (total knee replacement), right   Localized osteoarthritis of right knee   Past Medical History:  Diagnosis Date   AICD (automatic cardioverter/defibrillator) present    Anemia    Anxiety    Arthritis    Arthritis of carpometacarpal (CMC) joint of thumb 03/26/2016   Asthma    has had multiple hospitalizations for this   Asthma, moderate persistent 12/10/2011   Atrial fibrillation ([place])    ablation x 2 [facility], 01/2006, 2011.  on warfarin   Cardiac arrest ([place]) 04/2010   in hospital for pneumonia when this occured- occured at the hospital   CHF (congestive heart failure) ([place]) 2012   Echo 08/08/10 by [facility]. EF 35-45%. LV systolic function moderately reduced. Moderate global hypokinesis of LV.  RV systolic function moderately reduced. Mild MR. Trace AR.   Chronic diastolic heart failure ([place]) 01/07/2018   CKD (chronic kidney disease) stage 2, GFR 60-89 ml/min 02/22/2012   Her cr range 1.2-1.5 since Jan 2012 after hospitalization. 10/13 Estimated Creatinine Clearance: 69.3 ml/min (by C-G formula based on Cr of 1).     Complication of anesthesia    difficult time waking  up after anesthesia   Depression    Diabetes mellitus    Type 2   Diabetic neuropathy ([place]) 09/18/2011   GASTROESOPHAGEAL REFLUX, NO ESOPHAGITIS 06/10/2006   Qualifier: Diagnosis of  By: Eusebio Friendly     GERD (gastroesophageal reflux disease) 03/23/2018   History of hiatal hernia    History of kidney stones    HYPERCHOLESTEROLEMIA 06/10/2006   LDL 93 at 01/2011 check-  Continue pravastatin 20mg  po daily.  --discuss health modifications and possibly increaseing dose at next appt.  Cardiology- Dr little- stopped pravachol on 05/21/11- 2/2 allergies?     Hyperlipidemia    Hypertension    ICD (St. Jude Protecta dual-chamber),secondary prevention (VF arrest) January 2012 11/19/2012   Iliotibial band syndrome, left 11/01/2018   Limb pain 06/04/2008   LLE, Baker's cyst in popliteal fossa, no DVT   Long term current use of anticoagulant therapy 08/21/2015   Non-ischemic cardiomyopathy ([place])    echo 08/08/10 - EF [id number] LV and RV systolic function mod reduced   Obesity (BMI 30-39.9) 01/04/2014   OSA on CPAP 06/10/2006   Sleep study 02/2014 : severe apnea, corrected with nasal pillows and 8cmh2o     Osteoarthritis of right knee 08/28/2008   Qualifier: Diagnosis of  By: Barbaraann Barthel MD, Audelia Acton     Primary localized osteoarthritis of right knee    Sleep apnea    wears CPAP nightly   Type 2 diabetes mellitus with diabetic neuropathy, with long-term current use of insulin ([place]) 06/10/2006  Ventricular tachycardia 05/22/2015   Visit for monitoring Tikosyn therapy 08/21/2015   Vocal cord disease     Surgeries: Procedure(s): TOTAL KNEE ARTHROPLASTY on 04/28/2021   Consultants (if any):   Discharged Condition: Improved  Hospital Course: [name] is an 71 y.o. female who was admitted 04/28/2021 with a diagnosis of Primary localized osteoarthritis of right knee and went to the operating room on 04/28/2021 and underwent the above named procedures.    She was given perioperative  antibiotics:  Anti-infectives (From admission, onward)    Start     Dose/Rate Route Frequency Ordered Stop   04/28/21 0600  ceFAZolin (ANCEF) IVPB 2g/100 mL premix        2 g 200 mL/hr over 30 Minutes Intravenous On call to O.R. 04/28/21 0518 04/28/21 0742     .  She was given sequential compression devices, early ambulation, and Eliquis for DVT prophylaxis.  She benefited maximally from the hospital stay and there were no complications.    Recent vital signs:  Vitals:   04/30/21 0547 04/30/21 0825  BP: (!) 121/59   Pulse: (!) 104   Resp: 16   Temp: 98.5 F (36.9 C)   SpO2: 95% 96%    Recent laboratory studies:  Lab Results  Component Value Date   HGB 11.2 (L) 04/30/2021   HGB 11.8 (L) 04/29/2021   HGB 13.6 04/15/2021   Lab Results  Component Value Date   WBC 10.4 04/30/2021   PLT 174 04/30/2021   Lab Results  Component Value Date   INR 1.1 04/28/2021   Lab Results  Component Value Date   NA 133 (L) 04/29/2021   K 4.0 04/29/2021   CL 101 04/29/2021   CO2 27 04/29/2021   BUN 22 04/29/2021   CREATININE 1.14 (H) 04/29/2021   GLUCOSE 222 (H) 04/29/2021    Discharge Medications:   Allergies as of 04/30/2021       Reactions   Avelox [moxifloxacin Hcl In Nacl] Other (See Comments)   Cardiac arrest per pt   Nsaids Other (See Comments)   Cardiac arrest per pt   Simvastatin Other (See Comments)   Muscle pain   Ace Inhibitors Other (See Comments), Cough   Jardiance [empagliflozin] Other (See Comments)   Felt "crazy", fatigue, sweating, denies hypoglycemia while taking   Latex Rash        Medication List     TAKE these medications    acarbose 50 MG tablet Commonly known as: PRECOSE TAKE 1 TABLET(50 MG) BY MOUTH THREE TIMES DAILY WITH MEALS   acetaminophen 650 MG CR tablet Commonly known as: Tylenol 8 Hour Take 1 tablet (650 mg total) by mouth every 8 (eight) hours as needed for pain.   Eliquis 2.5 MG Tabs tablet Generic drug: apixaban Take 1  tablet by mouth 2  times daily for 3 doses. What changed: You were already taking a medication with the same name, and this prescription was added. Make sure you understand how and when to take each.   apixaban 5 MG Tabs tablet Commonly known as: ELIQUIS TAKE 1 TABLET(5 MG) BY MOUTH TWICE DAILY Start taking on: May 01, 2021 What changed: Another medication with the same name was added. Make sure you understand how and when to take each.   BD Pen Needle Nano 2nd Gen 32G X 4 MM Misc Generic drug: Insulin Pen Needle Use as directed with insulin once daily   bisoprolol 10 MG tablet Commonly known as: ZEBETA Take 1 tablet (  10 mg total) by mouth in the morning and at bedtime.   budesonide-formoterol 80-4.5 MCG/ACT inhaler Commonly known as: SYMBICORT INHALE 2 PUFFS INTO THE LUNGS IN THE MORNING AND AT BEDTIME   buPROPion 150 MG 24 hr tablet Commonly known as: WELLBUTRIN XL Take 1 tablet (150 mg total) by mouth daily.   diclofenac Sodium 1 % Gel Commonly known as: Voltaren Apply 2 g topically 4 (four) times daily. What changed:  when to take this reasons to take this   dofetilide 250 MCG capsule Commonly known as: TIKOSYN TAKE 1 CAPSULE(250 MCG) BY MOUTH TWICE DAILY   famotidine 20 MG tablet Commonly known as: PEPCID Take 1 tablet (20 mg total) by mouth daily.   Farxiga 10 MG Tabs tablet Generic drug: dapagliflozin propanediol Take 1 tablet (10 mg total) by mouth daily.   fluticasone 50 MCG/ACT nasal spray Commonly known as: FLONASE instill 1 spray into each nostril twice a day   Fluzone High-Dose Quadrivalent 0.7 ML Susy Generic drug: Influenza Vac High-Dose Quad   FreeStyle [place] 2 [employer] as directed   [employer] 2 [employer] Use as directed. Replace every 14 days   glucose blood test strip Use as instructed   levalbuterol 0.63 MG/3ML nebulizer solution Commonly known as: Xopenex One vial in nebulizer four times daily as needed    levalbuterol 45 MCG/ACT inhaler Commonly known as: Xopenex HFA INHALE 1 TO 2 PUFFS BY MOUTH INTO THE LUNGS EVERY 4 HOURS IF NEEDED FOR WHEEZING   methocarbamol 500 MG tablet Commonly known as: Robaxin Take 1 tablet by mouth every 8  hours as needed for up to 10 days for muscle spasms.   montelukast 10 MG tablet Commonly known as: SINGULAIR TAKE 1 TABLET BY MOUTH AT BEDTIME   ondansetron 4 MG tablet Commonly known as: Zofran Take 1 tablet  by mouth every 8 hours as needed for up to 14 days for nausea or vomiting.   oxyCODONE 5 MG immediate release tablet Commonly known as: Roxicodone Take 1 tablet by mouth every 4 hours as needed for up to 7 days for severe pain or moderate pain.   Ozempic (2 MG/DOSE) 8 MG/3ML Sopn Generic drug: Semaglutide (2 MG/DOSE) INJECT 2MG  UNDER THE SKIN WEEKLY   polyethylene glycol powder 17 GM/SCOOP powder Commonly known as: GLYCOLAX/MIRALAX take 17GM (DISSOLVED IN WATER) by mouth once daily THREE TIMES A WEEK if needed as directed   potassium chloride SA 20 MEQ tablet Commonly known as: KLOR-CON M Take 1 tablet by mouth daily as directed.   Prodigy Lancets 28G Misc 1 Units by Does not apply route 4 (four) times daily.   rosuvastatin 10 MG tablet Commonly known as: CRESTOR TAKE 1 TABLET BY MOUTH DAILY   Shingrix injection Generic drug: Zoster Vaccine Adjuvanted   torsemide 20 MG tablet Commonly known as: DEMADEX Take 1 tablet (20 mg total) by mouth daily.   Tyler Aas FlexTouch 100 UNIT/ML FlexTouch Pen Generic drug: insulin degludec Inject 40 Units into the skin daily.        Diagnostic Studies: DG Knee Right Port  Result Date: 04/28/2021 CLINICAL DATA:  Status post right knee arthroplasty EXAM: PORTABLE RIGHT KNEE - 1-2 VIEW COMPARISON:  None. FINDINGS: Status post right knee total arthroplasty with expected overlying postoperative change. No perihardware fracture or component malpositioning. IMPRESSION: Status post right knee total  arthroplasty with expected overlying postoperative change. No perihardware fracture or component malpositioning. Electronically Signed   By: Delanna Ahmadi M.D.   On:  04/28/2021 10:56    Disposition: Discharge disposition: 01-Home or Self Care       Discharge Instructions     Call MD / Call 911   Complete by: As directed    If you experience chest pain or shortness of breath, CALL 911 and be transported to the hospital emergency room.  If you develope a fever above 101 F, pus (white drainage) or increased drainage or redness at the wound, or calf pain, call your surgeon's office.   Constipation Prevention   Complete by: As directed    Drink plenty of fluids.  Prune juice may be helpful.  You may use a stool softener, such as Colace (over the counter) 100 mg twice a day.  Use MiraLax (over the counter) for constipation as needed.   Diet - low sodium heart healthy   Complete by: As directed    Do not put a pillow under the knee. Place it under the heel.   Complete by: As directed    Increase activity slowly as tolerated   Complete by: As directed    Post-operative opioid taper instructions:   Complete by: As directed    POST-OPERATIVE OPIOID TAPER INSTRUCTIONS: It is important to wean off of your opioid medication as soon as possible. If you do not need pain medication after your surgery it is ok to stop day one. Opioids include: Codeine, Hydrocodone(Norco, Vicodin), Oxycodone(Percocet, oxycontin) and hydromorphone amongst others.  Long term and even short term use of opiods can cause: Increased pain response Dependence Constipation Depression Respiratory depression And more.  Withdrawal symptoms can include Flu like symptoms Nausea, vomiting And more Techniques to manage these symptoms Hydrate well Eat regular healthy meals Stay active Use relaxation techniques(deep breathing, meditating, yoga) Do Not substitute Alcohol to help with tapering If you have been on opioids for  less than two weeks and do not have pain than it is ok to stop all together.  Plan to wean off of opioids This plan should start within one week post op of your joint replacement. Maintain the same interval or time between taking each dose and first decrease the dose.  Cut the total daily intake of opioids by one tablet each day Next start to increase the time between doses. The last dose that should be eliminated is the evening dose.      TED hose   Complete by: As directed    Use stockings (TED hose) for 4 weeks on your right leg(s).  You may remove them at night for sleeping.        Follow-up Information     Willaim Sheng, MD. Go on 05/15/2021.   Specialty: Orthopedic Surgery Why: Your appointment is at 4:15. Contact information: [address] [place] [address] [phone]         [facility], [place]. Go on 04/30/2021.   Why: Your outpatient physical therapy is scheduled and they will call you with the time Contact information: [facility] [place] [address] [phone]                  Signed: [place] 04/30/2021, 8:52 AM

## 2021-05-01 ENCOUNTER — Ambulatory Visit: Payer: Medicare HMO | Admitting: Sports Medicine

## 2021-05-05 DIAGNOSIS — Z96651 Presence of right artificial knee joint: Secondary | ICD-10-CM | POA: Diagnosis not present

## 2021-05-05 DIAGNOSIS — M6281 Muscle weakness (generalized): Secondary | ICD-10-CM | POA: Diagnosis not present

## 2021-05-05 DIAGNOSIS — R2689 Other abnormalities of gait and mobility: Secondary | ICD-10-CM | POA: Diagnosis not present

## 2021-05-07 ENCOUNTER — Other Ambulatory Visit: Payer: Self-pay

## 2021-05-07 ENCOUNTER — Other Ambulatory Visit: Payer: Self-pay | Admitting: Pharmacist

## 2021-05-07 ENCOUNTER — Other Ambulatory Visit (HOSPITAL_COMMUNITY): Payer: Self-pay

## 2021-05-07 MED ORDER — OZEMPIC (2 MG/DOSE) 8 MG/3ML ~~LOC~~ SOPN
PEN_INJECTOR | SUBCUTANEOUS | 2 refills | Status: DC
  Filled 2021-05-07: qty 3, 30d supply, fill #0
  Filled 2021-06-03 (×2): qty 3, 30d supply, fill #1
  Filled 2021-07-01: qty 3, 30d supply, fill #2

## 2021-05-08 DIAGNOSIS — M6281 Muscle weakness (generalized): Secondary | ICD-10-CM | POA: Diagnosis not present

## 2021-05-08 DIAGNOSIS — R2689 Other abnormalities of gait and mobility: Secondary | ICD-10-CM | POA: Diagnosis not present

## 2021-05-08 DIAGNOSIS — Z96651 Presence of right artificial knee joint: Secondary | ICD-10-CM | POA: Diagnosis not present

## 2021-05-09 ENCOUNTER — Other Ambulatory Visit (HOSPITAL_COMMUNITY): Payer: Self-pay

## 2021-05-12 DIAGNOSIS — R2689 Other abnormalities of gait and mobility: Secondary | ICD-10-CM | POA: Diagnosis not present

## 2021-05-12 DIAGNOSIS — M6281 Muscle weakness (generalized): Secondary | ICD-10-CM | POA: Diagnosis not present

## 2021-05-12 DIAGNOSIS — Z96651 Presence of right artificial knee joint: Secondary | ICD-10-CM | POA: Diagnosis not present

## 2021-05-13 ENCOUNTER — Other Ambulatory Visit (HOSPITAL_COMMUNITY): Payer: Self-pay

## 2021-05-15 DIAGNOSIS — M1711 Unilateral primary osteoarthritis, right knee: Secondary | ICD-10-CM | POA: Diagnosis not present

## 2021-05-15 DIAGNOSIS — Z96651 Presence of right artificial knee joint: Secondary | ICD-10-CM | POA: Diagnosis not present

## 2021-05-15 DIAGNOSIS — R2689 Other abnormalities of gait and mobility: Secondary | ICD-10-CM | POA: Diagnosis not present

## 2021-05-15 DIAGNOSIS — M6281 Muscle weakness (generalized): Secondary | ICD-10-CM | POA: Diagnosis not present

## 2021-05-19 ENCOUNTER — Other Ambulatory Visit: Payer: Self-pay | Admitting: Family Medicine

## 2021-05-19 ENCOUNTER — Other Ambulatory Visit (HOSPITAL_COMMUNITY): Payer: Self-pay

## 2021-05-19 ENCOUNTER — Ambulatory Visit (INDEPENDENT_AMBULATORY_CARE_PROVIDER_SITE_OTHER): Payer: Medicare HMO

## 2021-05-19 DIAGNOSIS — I5032 Chronic diastolic (congestive) heart failure: Secondary | ICD-10-CM

## 2021-05-19 DIAGNOSIS — R2689 Other abnormalities of gait and mobility: Secondary | ICD-10-CM | POA: Diagnosis not present

## 2021-05-19 DIAGNOSIS — Z95810 Presence of automatic (implantable) cardiac defibrillator: Secondary | ICD-10-CM

## 2021-05-19 DIAGNOSIS — M6281 Muscle weakness (generalized): Secondary | ICD-10-CM | POA: Diagnosis not present

## 2021-05-19 DIAGNOSIS — Z96651 Presence of right artificial knee joint: Secondary | ICD-10-CM | POA: Diagnosis not present

## 2021-05-19 MED ORDER — ACARBOSE 50 MG PO TABS
ORAL_TABLET | ORAL | 0 refills | Status: DC
  Filled 2021-05-19: qty 270, 90d supply, fill #0

## 2021-05-19 NOTE — Progress Notes (Signed)
EPIC Encounter for ICM Monitoring  Patient Name: [name] is a 71 y.o. female Date: 05/19/2021 Primary Care Physican: Alcus Dad, MD Primary Cardiologist: Croitoru Electrophysiologist: Croitoru 05/19/2021 Weight: 177 lbs   Clinical Status (21-Apr-2021 to 19-May-2021)  AT/AF 472 episodes  Time in AT/AF 1.3 hr/day (5.2%) (increased from 0.7% on 04/15/2021 report)  35 hours in AT/AF Since Last Session    Observations (5) (21-Apr-2021 to 19-May-2021) TriageHF (HF Risk) Alert: High (New) AT/AF >= 6 hr for 2 days Avg. Ventricular Rate >= 100 bpm during AT/AF (>= 6 hr) for 2 days. Possible OptiVol fluid accumulation: 28-Apr-2021 -- ongoing. Patient Activity less than 1 hr/day for 3 weeks.                                       Spoke with patient and heart failure questions reviewed.  Pt asymptomatic for fluid accumulation.   Pt had TKR surgery on 1/16 which correlates with decreased impedance.  She is doing well since Total knee surgery and rehab is going well.   Optivol thoracic impedance suggesting possible fluid accumulation starting 04/28/2021 but trending back close to baseline.   Prescribed dosage:  Torsemide 20 mg 1 tablet daily.   Potassium 20 mEq 1 tablet by mouth as directed.   Labs: 04/29/2021 Creatinine 1.14, BUN 22, Potassium 4.0, Sodium 133, GFR 52 04/15/2021 Creatinine 1.26, BUN 21, Potassium 4.6, Sodium 138, GFR 46 09/19/2020 Creatinine 1.31, BUN 23, Potassium 4.4, Sodium 142, GFR 44 08/30/2020 Creatinine 1.32, BUN 18, Potassium 4.1, Sodium 140, GFR 44 07/23/2020 Creatinine 1.11, BUN 17, Potassium 4.1, Sodium 139, GFR 54 07/11/2020 Creatinine 1.31, BUN 16, Potassium 4.8, Sodium 139, GFR 44 06/04/2020 Creatinine 1.28, BUN 21, Potassium 4.3, Sodium 141  A complete set of results can be found in Results Review.   Recommendations:  Recommendation to limit salt and fluid intake. Encouraged to call if experiencing any fluid symptoms.  Copy sent to Dr Sallyanne Kuster for review  and recommendations if needed.   Follow-up plan: ICM clinic phone appointment on 05/21/2021 to recheck fluid levels.   91 day device clinic remote transmission 05/21/2021.     Next office visit scheduled:  Recall 10/18/2021 for Dr Sallyanne Kuster.     Copy of ICM check sent to Dr. Sallyanne Kuster.       3 month ICM trend: 05/19/2021.    12-14 Month ICM trend:     [name], RN 05/19/2021 11:05 AM

## 2021-05-21 ENCOUNTER — Ambulatory Visit (INDEPENDENT_AMBULATORY_CARE_PROVIDER_SITE_OTHER): Payer: Medicare HMO

## 2021-05-21 ENCOUNTER — Other Ambulatory Visit (HOSPITAL_COMMUNITY): Payer: Self-pay

## 2021-05-21 DIAGNOSIS — I472 Ventricular tachycardia, unspecified: Secondary | ICD-10-CM | POA: Diagnosis not present

## 2021-05-21 DIAGNOSIS — I5032 Chronic diastolic (congestive) heart failure: Secondary | ICD-10-CM

## 2021-05-21 DIAGNOSIS — Z95810 Presence of automatic (implantable) cardiac defibrillator: Secondary | ICD-10-CM

## 2021-05-21 LAB — CUP PACEART REMOTE DEVICE CHECK
Battery Remaining Longevity: 51 mo
Battery Voltage: 2.97 V
Brady Statistic AP VP Percent: 0.36 %
Brady Statistic AP VS Percent: 97.13 %
Brady Statistic AS VP Percent: 0 %
Brady Statistic AS VS Percent: 2.51 %
Brady Statistic RA Percent Paced: 95.59 %
Brady Statistic RV Percent Paced: 0.45 %
Date Time Interrogation Session: 20230208033523
HighPow Impedance: 72 Ohm
Implantable Lead Implant Date: 20120120
Implantable Lead Implant Date: 20120120
Implantable Lead Location: 753859
Implantable Lead Location: 753860
Implantable Lead Model: 5076
Implantable Lead Model: 6935
Implantable Pulse Generator Implant Date: 20190618
Lead Channel Impedance Value: 323 Ohm
Lead Channel Impedance Value: 399 Ohm
Lead Channel Impedance Value: 437 Ohm
Lead Channel Pacing Threshold Amplitude: 0.5 V
Lead Channel Pacing Threshold Amplitude: 0.75 V
Lead Channel Pacing Threshold Pulse Width: 0.4 ms
Lead Channel Pacing Threshold Pulse Width: 0.4 ms
Lead Channel Sensing Intrinsic Amplitude: 1.125 mV
Lead Channel Sensing Intrinsic Amplitude: 1.125 mV
Lead Channel Sensing Intrinsic Amplitude: 12.125 mV
Lead Channel Sensing Intrinsic Amplitude: 12.125 mV
Lead Channel Setting Pacing Amplitude: 2 V
Lead Channel Setting Pacing Amplitude: 2.5 V
Lead Channel Setting Pacing Pulse Width: 0.4 ms
Lead Channel Setting Sensing Sensitivity: 0.3 mV

## 2021-05-21 NOTE — Progress Notes (Signed)
EPIC Encounter for ICM Monitoring  Patient Name: [name] is a 71 y.o. female Date: 05/21/2021 Primary Care Physican: Maury Dus, MD Primary Cardiologist: Croitoru Electrophysiologist: Croitoru 05/19/2021 Weight: 177 lbs                                       Spoke with patient and heart failure questions reviewed.  Pt asymptomatic for fluid accumulation.      Optivol thoracic impedance suggesting fluid levels returned close to normal.   Prescribed dosage:  Torsemide 20 mg 1 tablet daily.   Potassium 20 mEq 1 tablet by mouth as directed.   Labs: 04/29/2021 Creatinine 1.14, BUN 22, Potassium 4.0, Sodium 133, GFR 52 04/15/2021 Creatinine 1.26, BUN 21, Potassium 4.6, Sodium 138, GFR 46 09/19/2020 Creatinine 1.31, BUN 23, Potassium 4.4, Sodium 142, GFR 44 08/30/2020 Creatinine 1.32, BUN 18, Potassium 4.1, Sodium 140, GFR 44 07/23/2020 Creatinine 1.11, BUN 17, Potassium 4.1, Sodium 139, GFR 54 07/11/2020 Creatinine 1.31, BUN 16, Potassium 4.8, Sodium 139, GFR 44 06/04/2020 Creatinine 1.28, BUN 21, Potassium 4.3, Sodium 141  A complete set of results can be found in Results Review.   Recommendations: Recommendation to limit salt intake.  Encouraged to call if experiencing any fluid symptoms.    Follow-up plan: ICM clinic phone appointment on 06/23/2021.   91 day device clinic remote transmission 08/20/2021.     Next office visit scheduled:  Recall 10/18/2021 for Dr Royann Shivers.     Copy of ICM check sent to Dr. Royann Shivers.       3 month ICM trend: 05/21/2021.    12-14 Month ICM trend:     [name], RN 05/21/2021 9:49 AM

## 2021-05-22 ENCOUNTER — Ambulatory Visit: Payer: Medicare HMO | Admitting: Sports Medicine

## 2021-05-22 ENCOUNTER — Other Ambulatory Visit: Payer: Self-pay | Admitting: Family Medicine

## 2021-05-22 ENCOUNTER — Other Ambulatory Visit: Payer: Self-pay

## 2021-05-22 ENCOUNTER — Other Ambulatory Visit (HOSPITAL_COMMUNITY): Payer: Self-pay

## 2021-05-22 ENCOUNTER — Encounter: Payer: Self-pay | Admitting: Sports Medicine

## 2021-05-22 DIAGNOSIS — E1140 Type 2 diabetes mellitus with diabetic neuropathy, unspecified: Secondary | ICD-10-CM

## 2021-05-22 DIAGNOSIS — L84 Corns and callosities: Secondary | ICD-10-CM

## 2021-05-22 DIAGNOSIS — B351 Tinea unguium: Secondary | ICD-10-CM

## 2021-05-22 DIAGNOSIS — R2689 Other abnormalities of gait and mobility: Secondary | ICD-10-CM | POA: Diagnosis not present

## 2021-05-22 DIAGNOSIS — Z794 Long term (current) use of insulin: Secondary | ICD-10-CM | POA: Diagnosis not present

## 2021-05-22 DIAGNOSIS — M79676 Pain in unspecified toe(s): Secondary | ICD-10-CM | POA: Diagnosis not present

## 2021-05-22 DIAGNOSIS — M79609 Pain in unspecified limb: Secondary | ICD-10-CM

## 2021-05-22 DIAGNOSIS — M6281 Muscle weakness (generalized): Secondary | ICD-10-CM | POA: Diagnosis not present

## 2021-05-22 DIAGNOSIS — Z96651 Presence of right artificial knee joint: Secondary | ICD-10-CM | POA: Diagnosis not present

## 2021-05-22 MED ORDER — FAMOTIDINE 20 MG PO TABS
20.0000 mg | ORAL_TABLET | Freq: Every day | ORAL | 0 refills | Status: DC
  Filled 2021-05-22: qty 90, 90d supply, fill #0

## 2021-05-22 MED ORDER — DAPAGLIFLOZIN PROPANEDIOL 10 MG PO TABS
10.0000 mg | ORAL_TABLET | Freq: Every day | ORAL | 3 refills | Status: DC
  Filled 2021-05-22: qty 90, 90d supply, fill #0
  Filled 2021-08-15: qty 90, 90d supply, fill #1
  Filled 2021-11-11: qty 90, 90d supply, fill #2
  Filled 2022-02-09: qty 90, 90d supply, fill #3

## 2021-05-22 NOTE — Progress Notes (Signed)
Subjective: [name] is a 71 y.o. female patient with history of diabetes who presents to office today complaining of long,mildly painful nails and callus while ambulating in shoes; unable to trim.  Reports that she finally got her knee replacement on last month.  Patient on Eliquis.  Fasting blood sugar not recorded at this visit.  Patient Active Problem List   Diagnosis Date Noted   S/P TKR (total knee replacement), right 04/28/2021   Localized osteoarthritis of right knee 04/28/2021   Encounter for pre-operative respiratory clearance 02/28/2021   Aortic atherosclerosis (HCC) 02/19/2021   Screen for colon cancer 09/19/2020   Left ear pain 06/25/2020   Chronic renal impairment, stage 3a (HCC) 06/04/2020   Pain in both lower extremities 05/01/2020   Iliotibial band syndrome, left 11/01/2018   GERD (gastroesophageal reflux disease) 03/23/2018   Chronic diastolic heart failure (HCC) 01/07/2018   Mild obesity 01/07/2018   Arthritis of carpometacarpal (CMC) joint of thumb 03/26/2016   Primary localized osteoarthritis of right knee    Visit for monitoring Tikosyn therapy 08/21/2015   Long term current use of anticoagulant therapy 08/21/2015   Ventricular tachycardia 05/22/2015   Obesity (BMI 30-39.9) 01/04/2014   ICD (St. Jude Protecta dual-chamber),secondary prevention (VF arrest) January 2012 11/19/2012   Asthma, moderate persistent 12/10/2011   Diabetic neuropathy (HCC) 09/18/2011   Osteoarthritis of right knee 08/28/2008   Type 2 diabetes mellitus with diabetic neuropathy, with long-term current use of insulin (HCC) 06/10/2006   HYPERCHOLESTEROLEMIA 06/10/2006   Essential hypertension 06/10/2006   PAF (paroxysmal atrial fibrillation) (HCC) 06/10/2006   OSA on CPAP 06/10/2006   Current Outpatient Medications on File Prior to Visit  Medication Sig Dispense Refill   acarbose (PRECOSE) 50 MG tablet TAKE 1 TABLET BY MOUTH 3 TIMES DAILY WITH MEALS 270 tablet 0   acetaminophen  (TYLENOL 8 HOUR) 650 MG CR tablet Take 1 tablet (650 mg total) by mouth every 8 (eight) hours as needed for pain. 90 tablet 2   apixaban (ELIQUIS) 2.5 MG TABS tablet Take 1 tablet by mouth 2  times daily for 3 doses. 3 tablet 0   apixaban (ELIQUIS) 5 MG TABS tablet TAKE 1 TABLET(5 MG) BY MOUTH TWICE DAILY 180 tablet 1   bisoprolol (ZEBETA) 10 MG tablet Take 1 tablet (10 mg total) by mouth in the morning and at bedtime. 180 tablet 1   budesonide-formoterol (SYMBICORT) 80-4.5 MCG/ACT inhaler INHALE 2 PUFFS INTO THE LUNGS IN THE MORNING AND AT BEDTIME 10.2 g 11   buPROPion (WELLBUTRIN XL) 150 MG 24 hr tablet Take 1 tablet (150 mg total) by mouth daily. 90 tablet 3   Continuous Blood Gluc Receiver (FREESTYLE LIBRE 2 READER) DEVI Use as directed (Patient not taking: Reported on 04/21/2021) 1 each 0   Continuous Blood Gluc Sensor (FREESTYLE LIBRE 2 SENSOR) MISC Use as directed. Replace every 14 days (Patient not taking: Reported on 04/21/2021)     diclofenac Sodium (VOLTAREN) 1 % GEL Apply 2 g topically 4 (four) times daily. (Patient taking differently: Apply 2 g topically daily as needed (pain).) 100 g 1   dofetilide (TIKOSYN) 250 MCG capsule TAKE 1 CAPSULE  BY MOUTH TWICE DAILY 180 capsule 3   fluticasone (FLONASE) 50 MCG/ACT nasal spray instill 1 spray into each nostril twice a day (Patient not taking: Reported on 04/21/2021) 16 g 5   FLUZONE HIGH-DOSE QUADRIVALENT 0.7 ML SUSY  (Patient not taking: Reported on 03/27/2021)     glucose blood test strip Use as instructed  100 each 12   insulin degludec (TRESIBA) 100 UNIT/ML FlexTouch Pen Inject 40 Units into the skin daily. 15 mL 2   Insulin Pen Needle 32G X 4 MM MISC Use as directed with insulin once daily 100 each 0   levalbuterol (XOPENEX HFA) 45 MCG/ACT inhaler INHALE 1 TO 2 PUFFS BY MOUTH INTO THE LUNGS EVERY 4 HOURS IF NEEDED FOR WHEEZING (Patient not taking: Reported on 04/21/2021) 1 Inhaler 1   levalbuterol (XOPENEX) 0.63 MG/3ML nebulizer solution One vial  in nebulizer four times daily as needed (Patient not taking: Reported on 04/21/2021) 360 mL 5   montelukast (SINGULAIR) 10 MG tablet TAKE 1 TABLET BY MOUTH AT BEDTIME 90 tablet 0   polyethylene glycol powder (GLYCOLAX/MIRALAX) 17 GM/SCOOP powder take 17GM (DISSOLVED IN WATER) by mouth once daily THREE TIMES A WEEK if needed as directed (Patient not taking: Reported on 04/03/2021) 500 g 3   potassium chloride SA (KLOR-CON M) 20 MEQ tablet Take 1 tablet by mouth daily as directed. 30 tablet 10   PRODIGY LANCETS 28G MISC 1 Units by Does not apply route 4 (four) times daily. 100 each 4   rosuvastatin (CRESTOR) 10 MG tablet TAKE 1 TABLET BY MOUTH DAILY 90 tablet 2   Semaglutide, 2 MG/DOSE, (OZEMPIC, 2 MG/DOSE,) 8 MG/3ML SOPN INJECT 2MG  UNDER THE SKIN WEEKLY 3 mL 2   SHINGRIX injection  (Patient not taking: Reported on 03/27/2021)     torsemide (DEMADEX) 20 MG tablet Take 1 tablet (20 mg total) by mouth daily. 90 tablet 2   No current facility-administered medications on file prior to visit.   Allergies  Allergen Reactions   Avelox [Moxifloxacin Hcl In Nacl] Other (See Comments)    Cardiac arrest per pt   Nsaids Other (See Comments)    Cardiac arrest per pt    Simvastatin Other (See Comments)    Muscle pain   Ace Inhibitors Other (See Comments) and Cough   Jardiance [Empagliflozin] Other (See Comments)    Felt "crazy", fatigue, sweating, denies hypoglycemia while taking   Latex Rash    Recent Results (from the past 2160 hour(s))  Glucose, capillary     Status: Abnormal   Collection Time: 04/15/21  8:42 AM  Result Value Ref Range   Glucose-Capillary 138 (H) 70 - 99 mg/dL    Comment: Glucose reference range applies only to samples taken after fasting for at least 8 hours.  Surgical pcr screen     Status: None   Collection Time: 04/15/21  8:45 AM   Specimen: Nasal Mucosa; Nasal Swab  Result Value Ref Range   MRSA, PCR NEGATIVE NEGATIVE   Staphylococcus aureus NEGATIVE NEGATIVE    Comment:  (NOTE) The Xpert SA Assay (FDA approved for NASAL specimens in patients 87 years of age and older), is one component of a comprehensive surveillance program. It is not intended to diagnose infection nor to guide or monitor treatment. Performed at [facility], [place] [address], [place], [place] [address]   CBC WITH DIFFERENTIAL     Status: None   Collection Time: 04/15/21  8:45 AM  Result Value Ref Range   WBC 4.8 4.0 - 10.5 K/uL   RBC 4.64 3.87 - 5.11 MIL/uL   Hemoglobin 13.6 12.0 - 15.0 g/dL   HCT 43.0 36.0 - 46.0 %   MCV 92.7 80.0 - 100.0 fL   MCH 29.3 26.0 - 34.0 pg   MCHC 31.6 30.0 - 36.0 g/dL   RDW 13.9 11.5 - 15.5 %  Platelets 203 150 - 400 K/uL   nRBC 0.0 0.0 - 0.2 %   Neutrophils Relative % 44 %   Neutro Abs 2.1 1.7 - 7.7 K/uL   Lymphocytes Relative 40 %   Lymphs Abs 1.9 0.7 - 4.0 K/uL   Monocytes Relative 11 %   Monocytes Absolute 0.5 0.1 - 1.0 K/uL   Eosinophils Relative 5 %   Eosinophils Absolute 0.2 0.0 - 0.5 K/uL   Basophils Relative 0 %   Basophils Absolute 0.0 0.0 - 0.1 K/uL   Immature Granulocytes 0 %   Abs Immature Granulocytes 0.01 0.00 - 0.07 K/uL    Comment: Performed at [facility], [place] [address] [place], [place] [address]  Comprehensive metabolic panel     Status: Abnormal   Collection Time: 04/15/21  8:45 AM  Result Value Ref Range   Sodium 138 135 - 145 mmol/L   Potassium 4.6 3.5 - 5.1 mmol/L   Chloride 106 98 - 111 mmol/L   CO2 26 22 - 32 mmol/L   Glucose, Bld 122 (H) 70 - 99 mg/dL    Comment: Glucose reference range applies only to samples taken after fasting for at least 8 hours.   BUN 21 8 - 23 mg/dL   Creatinine, Ser 1.26 (H) 0.44 - 1.00 mg/dL   Calcium 8.7 (L) 8.9 - 10.3 mg/dL   Total Protein 7.5 6.5 - 8.1 g/dL   Albumin 3.5 3.5 - 5.0 g/dL   AST 37 15 - 41 U/L   ALT 59 (H) 0 - 44 U/L   Alkaline Phosphatase 86 38 - 126 U/L   Total Bilirubin 0.8 0.3 - 1.2 mg/dL   GFR, Estimated 46 (L) >60 mL/min     Comment: (NOTE) Calculated using the CKD-EPI Creatinine Equation (2021)    Anion gap 6 5 - 15    Comment: Performed at [facility], [place] [address] [place], [place] [address]  Protime-INR     Status: Abnormal   Collection Time: 04/15/21  8:45 AM  Result Value Ref Range   Prothrombin Time 16.9 (H) 11.4 - 15.2 seconds   INR 1.4 (H) 0.8 - 1.2    Comment: (NOTE) INR goal varies based on device and disease states. Performed at [facility], [place] [address], [place], [place] [address]   APTT     Status: None   Collection Time: 04/15/21  8:45 AM  Result Value Ref Range   aPTT 25 24 - 36 seconds    Comment: Performed at [facility], [place] [address], [place], [place] [address]  Type and screen Order type and screen if day of surgery is less than 15 days from draw of preadmission visit or order morning of surgery if day of surgery is greater than 6 days from preadmission visit.     Status: None   Collection Time: 04/15/21  8:45 AM  Result Value Ref Range   ABO/RH(D) B POS    Antibody Screen NEG    Sample Expiration 04/29/2021,2359    Extend sample reason      NO TRANSFUSIONS OR PREGNANCY IN THE PAST 3 MONTHS Performed at [facility], [place] [address] [place], [place] [address]   Hemoglobin A1c per protocol     Status: Abnormal   Collection Time: 04/15/21  8:45 AM  Result Value Ref Range   Hgb A1c MFr Bld 7.4 (H) 4.8 - 5.6 %    Comment: (NOTE) Pre diabetes:  5.7%-6.4%  Diabetes:              >6.4%  Glycemic control for   <7.0% adults with diabetes    Mean Plasma Glucose 165.68 mg/dL    Comment: Performed at [facility] [address] [place], [place] [address]  SARS CORONAVIRUS 2 (TAT 6-24 HRS) Nasopharyngeal Nasopharyngeal Swab     Status: None   Collection Time: 04/23/21  9:39 AM   Specimen: Nasopharyngeal Swab  Result Value Ref Range   SARS Coronavirus 2 NEGATIVE NEGATIVE     Comment: (NOTE) SARS-CoV-2 target nucleic acids are NOT DETECTED.  The SARS-CoV-2 RNA is generally detectable in upper and lower respiratory specimens during the acute phase of infection. Negative results do not preclude SARS-CoV-2 infection, do not rule out co-infections with other pathogens, and should not be used as the sole basis for treatment or other patient management decisions. Negative results must be combined with clinical observations, patient history, and epidemiological information. The expected result is Negative.  Fact Sheet for Patients: SugarRoll.be  Fact Sheet for Healthcare Providers: https://www.woods-mathews.com/  This test is not yet approved or cleared by the [place] FDA and  has been authorized for detection and/or diagnosis of SARS-CoV-2 by FDA under an Emergency Use Authorization (EUA). This EUA will remain  in effect (meaning this test can be used) for the duration of the COVID-19 declaration under Se ction 564(b)(1) of the Act, 21 U.S.C. section 360bbb-3(b)(1), unless the authorization is terminated or revoked sooner.  Performed at [facility], [place] [address], [place], [place] [address]   PT- INR Day of Surgery per protocol     Status: None   Collection Time: 04/28/21  6:00 AM  Result Value Ref Range   Prothrombin Time 14.5 11.4 - 15.2 seconds   INR 1.1 0.8 - 1.2    Comment: (NOTE) INR goal varies based on device and disease states. Performed at [facility], [place] [address] [place], [place] [address]   PTT Day of Surgery per protocol     Status: Abnormal   Collection Time: 04/28/21  6:00 AM  Result Value Ref Range   aPTT 20 (L) 24 - 36 seconds    Comment: Performed at [facility], [place] [address], [place], [place] [address]  Glucose, capillary     Status: Abnormal   Collection Time: 04/28/21  6:05 AM  Result Value Ref Range   Glucose-Capillary 129 (H)  70 - 99 mg/dL    Comment: Glucose reference range applies only to samples taken after fasting for at least 8 hours.  Glucose, capillary     Status: Abnormal   Collection Time: 04/28/21 10:29 AM  Result Value Ref Range   Glucose-Capillary 103 (H) 70 - 99 mg/dL    Comment: Glucose reference range applies only to samples taken after fasting for at least 8 hours.  MRSA Next Gen by PCR, Nasal     Status: None   Collection Time: 04/28/21  1:54 PM   Specimen: Nasal Mucosa; Nasal Swab  Result Value Ref Range   MRSA by PCR Next Gen NOT DETECTED NOT DETECTED    Comment: (NOTE) The GeneXpert MRSA Assay (FDA approved for NASAL specimens only), is one component of a comprehensive MRSA colonization surveillance program. It is not intended to diagnose MRSA infection nor to guide or monitor treatment for MRSA infections. Test performance is not FDA approved in patients less than 64 years old. Performed at [facility], [place] [address] [place], [place] [address]   Glucose, capillary     Status: Abnormal   Collection Time: 04/28/21  4:12 PM  Result Value Ref Range   Glucose-Capillary 224 (H) 70 - 99 mg/dL    Comment: Glucose reference range applies only to samples taken after fasting for at least 8 hours.   Comment 1 Notify RN    Comment 2 Document in Chart   Glucose, capillary     Status: Abnormal   Collection Time: 04/28/21 10:51 PM  Result Value Ref Range   Glucose-Capillary 307 (H) 70 - 99 mg/dL    Comment: Glucose reference range applies only to samples taken after fasting for at least 8 hours.  Glucose, capillary     Status: Abnormal   Collection Time: 04/29/21  7:45 AM  Result Value Ref Range   Glucose-Capillary 220 (H) 70 - 99 mg/dL    Comment: Glucose reference range applies only to samples taken after fasting for at least 8 hours.   Comment 1 Notify RN    Comment 2 Document in Chart   CBC     Status: Abnormal   Collection Time: 04/29/21  7:52 AM  Result Value Ref  Range   WBC 9.6 4.0 - 10.5 K/uL   RBC 4.09 3.87 - 5.11 MIL/uL   Hemoglobin 11.8 (L) 12.0 - 15.0 g/dL   HCT 37.4 36.0 - 46.0 %   MCV 91.4 80.0 - 100.0 fL   MCH 28.9 26.0 - 34.0 pg   MCHC 31.6 30.0 - 36.0 g/dL   RDW 13.6 11.5 - 15.5 %   Platelets 183 150 - 400 K/uL   nRBC 0.0 0.0 - 0.2 %    Comment: Performed at [facility], [place] [address] [place], [place] [id number]  Basic metabolic panel     Status: Abnormal   Collection Time: 04/29/21  7:52 AM  Result Value Ref Range   Sodium 133 (L) 135 - 145 mmol/L   Potassium 4.0 3.5 - 5.1 mmol/L   Chloride 101 98 - 111 mmol/L   CO2 27 22 - 32 mmol/L   Glucose, Bld 222 (H) 70 - 99 mg/dL    Comment: Glucose reference range applies only to samples taken after fasting for at least 8 hours.   BUN 22 8 - 23 mg/dL   Creatinine, Ser 1.14 (H) 0.44 - 1.00 mg/dL   Calcium 8.6 (L) 8.9 - 10.3 mg/dL   GFR, Estimated 52 (L) >60 mL/min    Comment: (NOTE) Calculated using the CKD-EPI Creatinine Equation (2021)    Anion gap 5 5 - 15    Comment: Performed at [facility], [place] [address], [place], [place] [address]  Glucose, capillary     Status: Abnormal   Collection Time: 04/29/21 12:51 PM  Result Value Ref Range   Glucose-Capillary 214 (H) 70 - 99 mg/dL    Comment: Glucose reference range applies only to samples taken after fasting for at least 8 hours.   Comment 1 Notify RN    Comment 2 Document in Chart   Glucose, capillary     Status: Abnormal   Collection Time: 04/29/21  5:04 PM  Result Value Ref Range   Glucose-Capillary 227 (H) 70 - 99 mg/dL    Comment: Glucose reference range applies only to samples taken after fasting for at least 8 hours.   Comment 1 Notify RN    Comment 2 Document in Chart   Glucose, capillary  Status: Abnormal   Collection Time: 04/29/21 11:20 PM  Result Value Ref Range   Glucose-Capillary 222 (H) 70 - 99 mg/dL    Comment: Glucose reference range applies only to samples taken  after fasting for at least 8 hours.  CBC     Status: Abnormal   Collection Time: 04/30/21  3:04 AM  Result Value Ref Range   WBC 10.4 4.0 - 10.5 K/uL   RBC 3.84 (L) 3.87 - 5.11 MIL/uL   Hemoglobin 11.2 (L) 12.0 - 15.0 g/dL   HCT 35.3 (L) 36.0 - 46.0 %   MCV 91.9 80.0 - 100.0 fL   MCH 29.2 26.0 - 34.0 pg   MCHC 31.7 30.0 - 36.0 g/dL   RDW 14.0 11.5 - 15.5 %   Platelets 174 150 - 400 K/uL   nRBC 0.0 0.0 - 0.2 %    Comment: Performed at [facility], [place] [address], [place], [place] [address]  Glucose, capillary     Status: Abnormal   Collection Time: 04/30/21  8:08 AM  Result Value Ref Range   Glucose-Capillary 209 (H) 70 - 99 mg/dL    Comment: Glucose reference range applies only to samples taken after fasting for at least 8 hours.  Glucose, capillary     Status: Abnormal   Collection Time: 04/30/21 11:49 AM  Result Value Ref Range   Glucose-Capillary 208 (H) 70 - 99 mg/dL    Comment: Glucose reference range applies only to samples taken after fasting for at least 8 hours.  CUP PACEART REMOTE DEVICE CHECK     Status: None   Collection Time: 05/21/21  3:35 AM  Result Value Ref Range   Date Time Interrogation Session [id number]    Pulse Generator Manufacturer MERM    Pulse Gen Model [id number] Evera MRI XT DR    Pulse Gen Serial Number [id number]    Clinic Name [facility]    Implantable Pulse Generator Type Implantable Cardiac Defibulator    Implantable Pulse Generator Implant Date [id number]    Implantable Lead Manufacturer [facility]    Implantable Lead Model 5076 CapSureFix Novus    Implantable Lead Serial Number [id number]    Implantable Lead Implant Date [id number]    Implantable Lead Location [id number]    Implantable Lead Manufacturer [facility]    Implantable Lead Model 6935 Sprint Quattro Secure S    Implantable Lead Serial Number [id number]    Implantable Lead Implant Date [id number]    Implantable Lead Location [id number]    Lead Channel Setting Sensing Sensitivity  0.3 mV   Lead Channel Setting Pacing Amplitude 2 V   Lead Channel Setting Pacing Pulse Width 0.4 ms   Lead Channel Setting Pacing Amplitude 2.5 V   Lead Channel Impedance Value 399 ohm   Lead Channel Sensing Intrinsic Amplitude 1.125 mV   Lead Channel Sensing Intrinsic Amplitude 1.125 mV   Lead Channel Pacing Threshold Amplitude 0.5 V   Lead Channel Pacing Threshold Pulse Width 0.4 ms   Lead Channel Impedance Value 437 ohm   Lead Channel Impedance Value 323 ohm   Lead Channel Sensing Intrinsic Amplitude 12.125 mV   Lead Channel Sensing Intrinsic Amplitude 12.125 mV   Lead Channel Pacing Threshold Amplitude 0.75 V   Lead Channel Pacing Threshold Pulse Width 0.4 ms   HighPow Impedance 72 ohm   Battery Status OK    Battery Remaining Longevity 51 mo   Battery Voltage 2.97 V   Brady Statistic RA Percent Paced 95.59 %  Brady Statistic RV Percent Paced 0.45 %   Brady Statistic AP VP Percent 0.36 %   Brady Statistic AS VP Percent 0 %   Brady Statistic AP VS Percent 97.13 %   Brady Statistic AS VS Percent 2.51 %    Objective: General: Patient is awake, alert, and oriented x 3 and in no acute distress.  Integument: Skin is warm, dry and supple bilateral. Nails are tender, long, thickened and dystrophic with subungual debris, consistent with onychomycosis, 1-5 bilateral. No signs of infection. + callus hallux, sub 3-4, and 5th toes bilateral. Remaining integument unremarkable.  Vasculature:  Dorsalis Pedis pulse 1/4 bilateral. Posterior Tibial pulse  1/4 bilateral. Capillary fill time <3 sec 1-5 bilateral. Scant hair growth to the level of the digits.Temperature gradient within normal limits. No varicosities present bilateral. No edema present bilateral.   Neurology: The patient has slighty diminished sensation measured with a 5.07/10g Semmes Weinstein Monofilament at all pedal sites bilateral . Vibratory sensation diminished bilateral with tuning fork. No Babinski sign present bilateral.    Musculoskeletal:  Asymptomatic pes planus, bunion and hammertoe pedal deformities noted bilateral. Muscular strength 5/5 in all lower extremity muscular groups bilateral without pain on range of motion. No tenderness with calf compression bilateral.  Assessment and Plan: Problem List Items Addressed This Visit       Endocrine   Type 2 diabetes mellitus with diabetic neuropathy, with long-term current use of insulin ([place])   Other Visit Diagnoses     Pain due to onychomycosis of nail    -  Primary   Corns and callosities          -Examined patient.  -Mechanically debrided callus >5 using sterile chisel blade and debrided all nails 1-5 bilateral using sterile nail nipper and filed with dremel without incident and no additional charge like previous -Continue with good supportive shoes daily for foot type and physical therapy after knee replacement -Patient to return  in 2.5 months for at risk foot care -Patient advised to call the office if any problems or questions arise in the meantime.  Landis Martins, DPM

## 2021-05-23 ENCOUNTER — Other Ambulatory Visit (HOSPITAL_COMMUNITY): Payer: Self-pay

## 2021-05-23 DIAGNOSIS — J449 Chronic obstructive pulmonary disease, unspecified: Secondary | ICD-10-CM | POA: Diagnosis not present

## 2021-05-23 DIAGNOSIS — G4733 Obstructive sleep apnea (adult) (pediatric): Secondary | ICD-10-CM | POA: Diagnosis not present

## 2021-05-23 DIAGNOSIS — J45909 Unspecified asthma, uncomplicated: Secondary | ICD-10-CM | POA: Diagnosis not present

## 2021-05-26 DIAGNOSIS — Z96651 Presence of right artificial knee joint: Secondary | ICD-10-CM | POA: Diagnosis not present

## 2021-05-26 DIAGNOSIS — M6281 Muscle weakness (generalized): Secondary | ICD-10-CM | POA: Diagnosis not present

## 2021-05-26 DIAGNOSIS — R2689 Other abnormalities of gait and mobility: Secondary | ICD-10-CM | POA: Diagnosis not present

## 2021-05-26 NOTE — Progress Notes (Signed)
Remote ICD transmission.   

## 2021-05-28 ENCOUNTER — Other Ambulatory Visit: Payer: Self-pay | Admitting: Family Medicine

## 2021-05-29 ENCOUNTER — Other Ambulatory Visit: Payer: Self-pay

## 2021-05-29 ENCOUNTER — Ambulatory Visit (INDEPENDENT_AMBULATORY_CARE_PROVIDER_SITE_OTHER): Payer: Medicare HMO | Admitting: Pharmacist

## 2021-05-29 DIAGNOSIS — E1140 Type 2 diabetes mellitus with diabetic neuropathy, unspecified: Secondary | ICD-10-CM

## 2021-05-29 DIAGNOSIS — Z96651 Presence of right artificial knee joint: Secondary | ICD-10-CM | POA: Diagnosis not present

## 2021-05-29 DIAGNOSIS — M6281 Muscle weakness (generalized): Secondary | ICD-10-CM | POA: Diagnosis not present

## 2021-05-29 DIAGNOSIS — Z794 Long term (current) use of insulin: Secondary | ICD-10-CM | POA: Diagnosis not present

## 2021-05-29 DIAGNOSIS — R2689 Other abnormalities of gait and mobility: Secondary | ICD-10-CM | POA: Diagnosis not present

## 2021-05-29 NOTE — Assessment & Plan Note (Signed)
T2DM is controlled based on A1C and home blood glucose readings likely due to optimal medication adherence. Patient on max dose GLP1 and SGLT2. Will keep regimen as is. Following instruction patient verbalized understanding of treatment plan. Following instruction patient verbalized understanding of treatment plan.   1. Continued Tresiba to 40 units once daily 2. Continued GLP-1 semaglutide (Ozempic) to 2mg  once weekly (Saturdays).  3. Continued SGLT2 farxiga 10mg  daily 4. Continued acarbose 50mg  three times a day with meals 5. Continue fingersticks first thing in AM and then 2-3x/week random 6. Extensively discussed pathophysiology of diabetes, dietary effects on blood sugar control, and recommended lifestyle interventions. 7. Counseled on s/sx of and management of hypoglycemia 8. Next A1C anticipated April 2023  Follow-up appointment in 8 weeks to review blood glucose readings.

## 2021-05-29 NOTE — Patient Instructions (Signed)
Miss [place]a it was a pleasure seeing you today.   Please do the following:  Continue your current medications as directed today during your appointment. If you have any questions or if you believe something has occurred because of this change, call me or your doctor to let one of [place] know.  Continue checking blood sugars at home. It's really important that you record these and bring these in to your next doctor's appointment.  Continue making the lifestyle changes we've discussed together during our visit. Diet and exercise play a significant role in improving your blood sugars.  Follow-up with Dr. Rock Nephew in two months   Hypoglycemia or low blood sugar:   Low blood sugar can happen quickly and may become an emergency if not treated right away.   While this shouldn't happen often, it can be brought upon if you skip a meal or do not eat enough. Also, if your insulin or other diabetes medications are dosed too high, this can cause your blood sugar to go to low.   Warning signs of low blood sugar include: Feeling shaky or dizzy Feeling weak or tired  Excessive hunger Feeling anxious or upset  Sweating even when you aren't exercising  What to do if I experience low blood sugar? Follow the Rule of 15 Check your blood sugar with your meter. If lower than 70, proceed to step 2.  Treat with 15 grams of fast acting carbs which is found in 3-4 glucose tablets. If none are available you can try hard candy, 1 tablespoon of sugar or honey,4 ounces of fruit juice, or 6 ounces of REGULAR soda.  Re-check your sugar in 15 minutes. If it is still below 70, do what you did in step 2 again. If your blood sugar has come back up, go ahead and eat a snack or small meal made up of complex carbs (ex. Whole grains) and protein at this time to avoid recurrence of low blood sugar.

## 2021-05-29 NOTE — Progress Notes (Signed)
° ° °  Subjective:    Patient ID: [name], female    DOB: 05/08/1950, 71 y.o.   MRN: [id number]  HPI Patient is a 70 y.o. female who presents for diabetes management. She is in good spirits and presents without assistance. Patient was last seen by Primary Care Provider on 09/19/20. Last seen in pharmacy clinic on 03/27/21.  Patient presents for diabetes follow s/p right knee replacement.   Insurance coverage/medication affordability: Medicare  Current diabetes medications include: farxiga 10mg , Tresiba 40 units once daily, Ozempic (semaglutide) 2 mg once weekly on Saturdays, Acarbose 50mg  TID with meals Patient states that She is taking her medications as prescribed. Patient reports adherence with medications.   Do you feel that your medications are working for you?  yes  Have you been experiencing any side effects to the medications prescribed? no  Do you have any problems obtaining medications due to transportation or finances?  no    Patient reported dietary habits: Patient reports that she does not eat on a consistent schedule and sometimes may eat less than three meals a day. - Breakfast: Currently eating oatmeal with a small amount of sugar and cinnamon - Lunch: tuna sandwich - Dinner: air fried chicken with brown rice and broccoli - Snacks: animal crackers, low sugar canned fruits   Patient denies hypoglycemic events Patient denies polyuria (increased urination).  Patient denies polyphagia (increased appetite).  Patient denies polydipsia (increased thirst).  Patient denies neuropathy (nerve pain). Patient denies visual changes. Patient reports self foot exams.   Self-reported fasting blood glucose readings:, 119; reports it has not been higher than 130  Objective:   Labs:   Physical Exam Neurological:     Mental Status: She is alert and oriented to person, place, and time.   Review of Systems  Gastrointestinal:  Negative for nausea and vomiting.   Lab Results   Component Value Date   HGBA1C 7.4 (H) 04/15/2021   HGBA1C 6.9 02/06/2021   HGBA1C 7.7 (A) 01/02/2021   Vitals:   05/29/21 0912  Weight: 180 lb 4.8 oz (81.8 kg)     Lipid Panel     Component Value Date/Time   CHOL 113 01/31/2020 0916   TRIG 93 01/31/2020 0916   HDL 46 01/31/2020 0916   CHOLHDL 2.5 01/31/2020 0916   CHOLHDL 2.4 04/21/2016 1438   VLDL 15 04/21/2016 1438   LDLCALC 49 01/31/2020 0916   LDLDIRECT 94 12/07/2007 2019    Assessment/Plan:   T2DM is controlled based on A1C and home blood glucose readings likely due to optimal medication adherence. Patient on max dose GLP1 and SGLT2. Will keep regimen as is. Following instruction patient verbalized understanding of treatment plan. Following instruction patient verbalized understanding of treatment plan.   Continued Tresiba to 40 units once daily Continued GLP-1 semaglutide (Ozempic) to 2mg  once weekly (Saturdays).  Continued SGLT2 farxiga 10mg  daily Continued acarbose 50mg  three times a day with meals Continue fingersticks first thing in AM and then 2-3x/week random Extensively discussed pathophysiology of diabetes, dietary effects on blood sugar control, and recommended lifestyle interventions. Counseled on s/sx of and management of hypoglycemia Next A1C anticipated April 2023  Follow-up appointment in 8 weeks to review blood glucose readings. Written patient instructions provided.  This appointment required 30 minutes of direct patient care.  Thank you for involving pharmacy to assist in providing this patient's care.

## 2021-06-03 ENCOUNTER — Other Ambulatory Visit (HOSPITAL_COMMUNITY): Payer: Self-pay

## 2021-06-04 ENCOUNTER — Other Ambulatory Visit (HOSPITAL_COMMUNITY): Payer: Self-pay

## 2021-06-05 ENCOUNTER — Ambulatory Visit: Payer: Medicare HMO | Admitting: Pharmacist

## 2021-06-10 DIAGNOSIS — M1711 Unilateral primary osteoarthritis, right knee: Secondary | ICD-10-CM | POA: Diagnosis not present

## 2021-06-16 ENCOUNTER — Other Ambulatory Visit (HOSPITAL_COMMUNITY): Payer: Self-pay

## 2021-06-16 ENCOUNTER — Other Ambulatory Visit: Payer: Self-pay

## 2021-06-19 ENCOUNTER — Other Ambulatory Visit (HOSPITAL_COMMUNITY): Payer: Self-pay

## 2021-06-20 ENCOUNTER — Other Ambulatory Visit: Payer: Self-pay | Admitting: Family Medicine

## 2021-06-20 ENCOUNTER — Other Ambulatory Visit: Payer: Self-pay | Admitting: Cardiovascular Disease

## 2021-06-20 ENCOUNTER — Other Ambulatory Visit (HOSPITAL_COMMUNITY): Payer: Self-pay

## 2021-06-20 MED ORDER — BD PEN NEEDLE NANO 2ND GEN 32G X 4 MM MISC
2 refills | Status: DC
  Filled 2021-06-20: qty 100, 90d supply, fill #0
  Filled 2021-09-23: qty 100, 90d supply, fill #1
  Filled 2021-12-16: qty 100, 90d supply, fill #2

## 2021-06-21 ENCOUNTER — Other Ambulatory Visit (HOSPITAL_COMMUNITY): Payer: Self-pay

## 2021-06-23 ENCOUNTER — Other Ambulatory Visit (HOSPITAL_COMMUNITY): Payer: Self-pay

## 2021-06-23 ENCOUNTER — Other Ambulatory Visit: Payer: Self-pay | Admitting: Family Medicine

## 2021-06-23 ENCOUNTER — Ambulatory Visit (INDEPENDENT_AMBULATORY_CARE_PROVIDER_SITE_OTHER): Payer: Medicare HMO

## 2021-06-23 DIAGNOSIS — I5032 Chronic diastolic (congestive) heart failure: Secondary | ICD-10-CM

## 2021-06-23 DIAGNOSIS — Z95810 Presence of automatic (implantable) cardiac defibrillator: Secondary | ICD-10-CM

## 2021-06-23 MED ORDER — TRESIBA FLEXTOUCH 100 UNIT/ML ~~LOC~~ SOPN
40.0000 [IU] | PEN_INJECTOR | Freq: Every day | SUBCUTANEOUS | 2 refills | Status: DC
  Filled 2021-06-23: qty 15, 37d supply, fill #0
  Filled 2021-07-29: qty 15, 37d supply, fill #1
  Filled 2021-09-09: qty 15, 37d supply, fill #2

## 2021-06-24 NOTE — Progress Notes (Signed)
EPIC Encounter for ICM Monitoring ? ?Patient Name: [name] is a 71 y.o. female ?Date: 06/24/2021 ?Primary Care Physican: Alcus Dad, MD ?Primary Cardiologist: Croitoru ?Electrophysiologist: Croitoru ?05/19/2021 Weight: 177 lbs ?06/24/2021 Weight: 172-175 lbs ? ?Clinical Status Since 03-Jun-2021 ?VT-NS (>4 beats, >207 bpm) 1 ?AT/AF 64 ?Time in AT/AF 0.5 hr/day (1.9%) ?Longest AT/AF 69 minutes ?                             ?  ?       Spoke with patient and heart failure questions reviewed.  Pt asymptomatic for fluid accumulation.   She reports she has been limited in preparing her own food due to recent knee surgery and may be eating differently than normal.  She also maybe drinking more than usual due to dry mouth.  Compliant with taking Torsemide.  ?  ?Optivol thoracic impedance suggesting intermittent days of possible fluid accumulation since 2/8. ?  ?Prescribed dosage:  ?Torsemide 20 mg 1 tablet daily.   ?Potassium 20 mEq 1 tablet by mouth as directed. ?  ?Labs: ?04/29/2021 Creatinine 1.14, BUN 22, Potassium 4.0, Sodium 133, GFR 52 ?04/15/2021 Creatinine 1.26, BUN 21, Potassium 4.6, Sodium 138, GFR 46 ?09/19/2020 Creatinine 1.31, BUN 23, Potassium 4.4, Sodium 142, GFR 44 ?08/30/2020 Creatinine 1.32, BUN 18, Potassium 4.1, Sodium 140, GFR 44 ?07/23/2020 Creatinine 1.11, BUN 17, Potassium 4.1, Sodium 139, GFR 54 ?07/11/2020 Creatinine 1.31, BUN 16, Potassium 4.8, Sodium 139, GFR 44 ?06/04/2020 Creatinine 1.28, BUN 21, Potassium 4.3, Sodium 141  ?A complete set of results can be found in Results Review. ?  ?Recommendations: Recommendation to limit salt and fluid intake.  Encouraged to call if experiencing any fluid symptoms.  ?  ?Follow-up plan: ICM clinic phone appointment on 07/01/2021 to recheck fluid levels.   91 day device clinic remote transmission 08/20/2021.   ?  ?Next office visit scheduled:  Recall 10/18/2021 for Dr Sallyanne Kuster.   ?  ?Copy of ICM check sent to Dr. Sallyanne Kuster.       ? ?3 month ICM trend:  06/23/2021. ? ? ? ?12-14 Month ICM trend:  ? ? ? ?[name], RN ?06/24/2021 ?12:29 PM ? ?

## 2021-06-25 ENCOUNTER — Telehealth: Payer: Self-pay | Admitting: Cardiovascular Disease

## 2021-06-25 ENCOUNTER — Other Ambulatory Visit: Payer: Self-pay | Admitting: Cardiovascular Disease

## 2021-06-25 ENCOUNTER — Other Ambulatory Visit (HOSPITAL_COMMUNITY): Payer: Self-pay

## 2021-06-25 ENCOUNTER — Other Ambulatory Visit: Payer: Self-pay | Admitting: Pulmonary Disease

## 2021-06-25 NOTE — Telephone Encounter (Signed)
?*  STAT* If patient is at the pharmacy, call can be transferred to refill team. ? ? ?1. Which medications need to be refilled? (please list name of each medication and dose if known) apixaban (ELIQUIS) 5 MG TABS tablet ? ?2. Which pharmacy/location (including street and city if local pharmacy) is medication to be sent to? [facility] ? ?3. Do they need a 30 day or 90 day supply? 90 day ? ? ? ?

## 2021-06-26 ENCOUNTER — Other Ambulatory Visit: Payer: Self-pay

## 2021-06-26 ENCOUNTER — Other Ambulatory Visit (HOSPITAL_COMMUNITY): Payer: Self-pay

## 2021-06-26 MED ORDER — APIXABAN 5 MG PO TABS
ORAL_TABLET | ORAL | 1 refills | Status: DC
  Filled 2021-06-26: qty 180, 90d supply, fill #0
  Filled 2021-09-17: qty 180, 90d supply, fill #1

## 2021-06-26 NOTE — Telephone Encounter (Signed)
? ?  Pt is almost out of meds, needs refill today ?

## 2021-06-26 NOTE — Telephone Encounter (Signed)
Prescription refill request for Eliquis received. ?Indication:Afib ?Last office visit:1/23 ?Scr:1.1 ?Age: 71 ?Weight:81.8 kg ? ?Prescription refilled ? ?

## 2021-06-27 ENCOUNTER — Other Ambulatory Visit (HOSPITAL_COMMUNITY): Payer: Self-pay

## 2021-07-01 ENCOUNTER — Telehealth: Payer: Self-pay | Admitting: Pulmonary Disease

## 2021-07-01 ENCOUNTER — Telehealth: Payer: Self-pay

## 2021-07-01 ENCOUNTER — Ambulatory Visit (INDEPENDENT_AMBULATORY_CARE_PROVIDER_SITE_OTHER): Payer: Medicare HMO

## 2021-07-01 ENCOUNTER — Other Ambulatory Visit (HOSPITAL_COMMUNITY): Payer: Self-pay

## 2021-07-01 DIAGNOSIS — I5032 Chronic diastolic (congestive) heart failure: Secondary | ICD-10-CM

## 2021-07-01 DIAGNOSIS — Z95810 Presence of automatic (implantable) cardiac defibrillator: Secondary | ICD-10-CM

## 2021-07-01 NOTE — Telephone Encounter (Signed)
Remote ICM transmission received.  Attempted call to patient regarding ICM remote transmission and left detailed message per DPR.  Advised to return call for any fluid symptoms or questions. Next ICM remote transmission scheduled 07/28/2021.   ? ?

## 2021-07-01 NOTE — Progress Notes (Signed)
EPIC Encounter for ICM Monitoring ? ?Patient Name: [name] is a 71 y.o. female ?Date: 07/01/2021 ?Primary Care Physican: Maury Dus, MD ?Primary Cardiologist: Croitoru ?Electrophysiologist: Croitoru ?05/19/2021 Weight: 177 lbs ?06/24/2021 Weight: 172-175 lbs ?  ?Clinical Status   Since 23-Jun-2021 ?Time in AT/AF <0.1 hr/day (<0.1%) ?                             ?  ?       Attempted call to patient and unable to reach.  Left detailed message per DPR regarding transmission. Transmission reviewed.  ?  ?Optivol thoracic impedance suggesting intermittent days of possible fluid accumulation since 2/8 but trending close to baseline. ?  ?Prescribed dosage:  ?Torsemide 20 mg 1 tablet daily.   ?Potassium 20 mEq 1 tablet by mouth as directed. ?  ?Labs: ?04/29/2021 Creatinine 1.14, BUN 22, Potassium 4.0, Sodium 133, GFR 52 ?04/15/2021 Creatinine 1.26, BUN 21, Potassium 4.6, Sodium 138, GFR 46 ?09/19/2020 Creatinine 1.31, BUN 23, Potassium 4.4, Sodium 142, GFR 44 ?08/30/2020 Creatinine 1.32, BUN 18, Potassium 4.1, Sodium 140, GFR 44 ?07/23/2020 Creatinine 1.11, BUN 17, Potassium 4.1, Sodium 139, GFR 54 ?07/11/2020 Creatinine 1.31, BUN 16, Potassium 4.8, Sodium 139, GFR 44 ?06/04/2020 Creatinine 1.28, BUN 21, Potassium 4.3, Sodium 141  ?A complete set of results can be found in Results Review. ?  ?Recommendations: Left voice mail with ICM number and encouraged to call if experiencing any fluid symptoms and to limit salt intake. ?  ?Follow-up plan: ICM clinic phone appointment on 07/28/2021.   91 day device clinic remote transmission 08/20/2021.   ?  ?Next office visit scheduled:  Recall 10/18/2021 for Dr Royann Shivers.   ?  ?Copy of ICM check sent to Dr. Royann Shivers.       ? ?3 month ICM trend: 07/01/2021. ? ? ? ?12-14 Month ICM trend:  ? ? ? ?[name], RN ?07/01/2021 ?10:27 AM ? ?

## 2021-07-02 ENCOUNTER — Other Ambulatory Visit (HOSPITAL_COMMUNITY): Payer: Self-pay

## 2021-07-02 MED ORDER — MONTELUKAST SODIUM 10 MG PO TABS
10.0000 mg | ORAL_TABLET | Freq: Every day | ORAL | 0 refills | Status: DC
Start: 2021-07-02 — End: 2021-09-23
  Filled 2021-07-02: qty 90, 90d supply, fill #0

## 2021-07-02 NOTE — Telephone Encounter (Signed)
Sent Singulair into patients preferred pharmacy. Nothing further needed.  ?

## 2021-07-15 ENCOUNTER — Other Ambulatory Visit (HOSPITAL_COMMUNITY): Payer: Self-pay

## 2021-07-21 DIAGNOSIS — J45909 Unspecified asthma, uncomplicated: Secondary | ICD-10-CM | POA: Diagnosis not present

## 2021-07-21 DIAGNOSIS — J449 Chronic obstructive pulmonary disease, unspecified: Secondary | ICD-10-CM | POA: Diagnosis not present

## 2021-07-21 DIAGNOSIS — G4733 Obstructive sleep apnea (adult) (pediatric): Secondary | ICD-10-CM | POA: Diagnosis not present

## 2021-07-22 DIAGNOSIS — M1711 Unilateral primary osteoarthritis, right knee: Secondary | ICD-10-CM | POA: Diagnosis not present

## 2021-07-24 ENCOUNTER — Ambulatory Visit: Payer: Medicare HMO | Admitting: Sports Medicine

## 2021-07-24 ENCOUNTER — Encounter: Payer: Self-pay | Admitting: Sports Medicine

## 2021-07-24 DIAGNOSIS — M2012 Hallux valgus (acquired), left foot: Secondary | ICD-10-CM

## 2021-07-24 DIAGNOSIS — M2011 Hallux valgus (acquired), right foot: Secondary | ICD-10-CM

## 2021-07-24 DIAGNOSIS — E1142 Type 2 diabetes mellitus with diabetic polyneuropathy: Secondary | ICD-10-CM

## 2021-07-24 DIAGNOSIS — E1140 Type 2 diabetes mellitus with diabetic neuropathy, unspecified: Secondary | ICD-10-CM

## 2021-07-24 DIAGNOSIS — M2141 Flat foot [pes planus] (acquired), right foot: Secondary | ICD-10-CM

## 2021-07-24 DIAGNOSIS — M2041 Other hammer toe(s) (acquired), right foot: Secondary | ICD-10-CM

## 2021-07-24 DIAGNOSIS — M2142 Flat foot [pes planus] (acquired), left foot: Secondary | ICD-10-CM

## 2021-07-24 DIAGNOSIS — M2042 Other hammer toe(s) (acquired), left foot: Secondary | ICD-10-CM

## 2021-07-24 DIAGNOSIS — M79609 Pain in unspecified limb: Secondary | ICD-10-CM | POA: Diagnosis not present

## 2021-07-24 DIAGNOSIS — Z794 Long term (current) use of insulin: Secondary | ICD-10-CM

## 2021-07-24 DIAGNOSIS — B351 Tinea unguium: Secondary | ICD-10-CM | POA: Diagnosis not present

## 2021-07-24 DIAGNOSIS — L84 Corns and callosities: Secondary | ICD-10-CM

## 2021-07-24 NOTE — Progress Notes (Signed)
?Subjective: ?[name] is a 71 y.o. female patient with history of diabetes who presents to office today complaining of long,mildly painful nails and callus while ambulating in shoes; unable to trim.  Reports that she is doing much better. ? ?Patient on Eliquis. ? ?Fasting blood sugar 97 last A1c 6.9 last visit to PCP was February of this year ? ?Patient Active Problem List  ? Diagnosis Date Noted  ? S/P TKR (total knee replacement), right 04/28/2021  ? Localized osteoarthritis of right knee 04/28/2021  ? Encounter for pre-operative respiratory clearance 02/28/2021  ? Aortic atherosclerosis ([place]) 02/19/2021  ? Screen for colon cancer 09/19/2020  ? Left ear pain 06/25/2020  ? Chronic renal impairment, stage 3a ([place]) 06/04/2020  ? Pain in both lower extremities 05/01/2020  ? Iliotibial band syndrome, left 11/01/2018  ? GERD (gastroesophageal reflux disease) 03/23/2018  ? Chronic diastolic heart failure ([place]) 01/07/2018  ? Mild obesity 01/07/2018  ? Arthritis of carpometacarpal [facility]) joint of thumb 03/26/2016  ? Primary localized osteoarthritis of right knee   ? Visit for monitoring Tikosyn therapy 08/21/2015  ? Long term current use of anticoagulant therapy 08/21/2015  ? Ventricular tachycardia ([place]) 05/22/2015  ? Obesity (BMI 30-39.9) 01/04/2014  ? ICD (St. Jude Protecta dual-chamber),secondary prevention (VF arrest) January 2012 11/19/2012  ? Asthma, moderate persistent 12/10/2011  ? Diabetic neuropathy ([place]) 09/18/2011  ? Osteoarthritis of right knee 08/28/2008  ? Type 2 diabetes mellitus with diabetic neuropathy, with long-term current use of insulin ([place]) 06/10/2006  ? HYPERCHOLESTEROLEMIA 06/10/2006  ? Essential hypertension 06/10/2006  ? PAF (paroxysmal atrial fibrillation) ([place]) 06/10/2006  ? OSA on CPAP 06/10/2006  ? ?Current Outpatient Medications on File Prior to Visit  ?Medication Sig Dispense Refill  ? acarbose (PRECOSE) 50 MG tablet TAKE 1 TABLET BY MOUTH 3 TIMES DAILY WITH MEALS 270 tablet 0  ?  acetaminophen (TYLENOL 8 HOUR) 650 MG CR tablet Take 1 tablet (650 mg total) by mouth every 8 (eight) hours as needed for pain. (Patient not taking: Reported on 05/29/2021) 90 tablet 2  ? apixaban (ELIQUIS) 2.5 MG TABS tablet Take 1 tablet by mouth 2  times daily for 3 doses. 3 tablet 0  ? apixaban (ELIQUIS) 5 MG TABS tablet TAKE 1 TABLET (5 MG) BY MOUTH TWICE DAILY 180 tablet 1  ? bisoprolol (ZEBETA) 10 MG tablet Take 1 tablet (10 mg total) by mouth in the morning and at bedtime. 180 tablet 1  ? budesonide-formoterol (SYMBICORT) 80-4.5 MCG/ACT inhaler INHALE 2 PUFFS INTO THE LUNGS IN THE MORNING AND AT BEDTIME 10.2 g 11  ? buPROPion (WELLBUTRIN XL) 150 MG 24 hr tablet Take 1 tablet (150 mg total) by mouth daily. 90 tablet 3  ? Continuous Blood Gluc Receiver (FREESTYLE LIBRE 2 READER) DEVI Use as directed (Patient not taking: Reported on 04/21/2021) 1 each 0  ? Continuous Blood Gluc Sensor (FREESTYLE LIBRE 2 SENSOR) MISC Use as directed. Replace every 14 days (Patient not taking: Reported on 04/21/2021)    ? dapagliflozin propanediol (FARXIGA) 10 MG TABS tablet Take 1 tablet (10 mg total) by mouth daily. 90 tablet 3  ? diclofenac Sodium (VOLTAREN) 1 % GEL Apply 2 g topically 4 (four) times daily. (Patient taking differently: Apply 2 g topically daily as needed (pain).) 100 g 1  ? dofetilide (TIKOSYN) 250 MCG capsule TAKE 1 CAPSULE  BY MOUTH TWICE DAILY 180 capsule 3  ? famotidine (PEPCID) 20 MG tablet Take 1 tablet (20 mg total) by mouth daily. 90 tablet 0  ?  fluticasone (FLONASE) 50 MCG/ACT nasal spray instill 1 spray into each nostril twice a day (Patient not taking: Reported on 04/21/2021) 16 g 5  ? FLUZONE HIGH-DOSE QUADRIVALENT 0.7 ML SUSY  (Patient not taking: Reported on 03/27/2021)    ? glucose blood test strip Use as instructed 100 each 12  ? insulin degludec (TRESIBA FLEXTOUCH) 100 UNIT/ML FlexTouch Pen Inject 40 Units into the skin daily. 15 mL 2  ? Insulin Pen Needle (BD PEN NEEDLE NANO 2ND GEN) 32G X 4 MM MISC  Use as directed with insulin once daily 100 each 2  ? levalbuterol (XOPENEX HFA) 45 MCG/ACT inhaler INHALE 1 TO 2 PUFFS BY MOUTH INTO THE LUNGS EVERY 4 HOURS IF NEEDED FOR WHEEZING (Patient not taking: Reported on 04/21/2021) 1 Inhaler 1  ? levalbuterol (XOPENEX) 0.63 MG/3ML nebulizer solution One vial in nebulizer four times daily as needed (Patient not taking: Reported on 04/21/2021) 360 mL 5  ? montelukast (SINGULAIR) 10 MG tablet TAKE 1 TABLET BY MOUTH AT BEDTIME 90 tablet 0  ? oxyCODONE (OXY IR/ROXICODONE) 5 MG immediate release tablet Take 5 mg by mouth every 4 (four) hours as needed.    ? polyethylene glycol powder (GLYCOLAX/MIRALAX) 17 GM/SCOOP powder take 17GM (DISSOLVED IN WATER) by mouth once daily THREE TIMES A WEEK if needed as directed (Patient not taking: Reported on 04/03/2021) 500 g 3  ? potassium chloride SA (KLOR-CON M) 20 MEQ tablet Take 1 tablet by mouth daily as directed. 30 tablet 10  ? PRODIGY LANCETS 28G MISC 1 Units by Does not apply route 4 (four) times daily. 100 each 4  ? rosuvastatin (CRESTOR) 10 MG tablet TAKE 1 TABLET BY MOUTH DAILY 90 tablet 2  ? Semaglutide, 2 MG/DOSE, (OZEMPIC, 2 MG/DOSE,) 8 MG/3ML SOPN INJECT 2MG  UNDER THE SKIN WEEKLY 3 mL 2  ? SHINGRIX injection  (Patient not taking: Reported on 03/27/2021)    ? torsemide (DEMADEX) 20 MG tablet Take 1 tablet (20 mg total) by mouth daily. 90 tablet 2  ? ?No current facility-administered medications on file prior to visit.  ? ?Allergies  ?Allergen Reactions  ? Avelox [Moxifloxacin Hcl In Nacl] Other (See Comments)  ?  Cardiac arrest per pt  ? Nsaids Other (See Comments)  ?  Cardiac arrest per pt ?  ? Simvastatin Other (See Comments)  ?  Muscle pain  ? Ace Inhibitors Other (See Comments) and Cough  ? Jardiance [Empagliflozin] Other (See Comments)  ?  Felt "crazy", fatigue, sweating, denies hypoglycemia while taking  ? Latex Rash  ? ? ?Recent Results (from the past 2160 hour(s))  ?PT- INR Day of Surgery per protocol     Status: None  ?  Collection Time: 04/28/21  6:00 AM  ?Result Value Ref Range  ? Prothrombin Time 14.5 11.4 - 15.2 seconds  ? INR 1.1 0.8 - 1.2  ?  Comment: (NOTE) ?INR goal varies based on device and disease states. ?Performed at [facility], [address] ?[place], [place] [address] ?  ?PTT Day of Surgery per protocol     Status: Abnormal  ? Collection Time: 04/28/21  6:00 AM  ?Result Value Ref Range  ? aPTT 20 (L) 24 - 36 seconds  ?  Comment: Performed at [facility], [place] [address] [place], [place] [address]  ?Glucose, capillary     Status: Abnormal  ? Collection Time: 04/28/21  6:05 AM  ?Result Value Ref Range  ? Glucose-Capillary 129 (H) 70 - 99 mg/dL  ?  Comment: Glucose reference range applies only to samples taken after fasting for at least 8 hours.  ?Glucose, capillary     Status: Abnormal  ? Collection Time: 04/28/21 10:29 AM  ?Result Value Ref Range  ? Glucose-Capillary 103 (H) 70 - 99 mg/dL  ?  Comment: Glucose reference range applies only to samples taken after fasting for at least 8 hours.  ?MRSA Next Gen by PCR, Nasal     Status: None  ? Collection Time: 04/28/21  1:54 PM  ? Specimen: Nasal Mucosa; Nasal Swab  ?Result Value Ref Range  ? MRSA by PCR Next Gen NOT DETECTED NOT DETECTED  ?  Comment: (NOTE) ?The GeneXpert MRSA Assay (FDA approved for NASAL specimens only), ?is one component of a comprehensive MRSA colonization surveillance ?program. It is not intended to diagnose MRSA infection nor to guide ?or monitor treatment for MRSA infections. ?Test performance is not FDA approved in patients less than 2 years ?old. ?Performed at [facility], [address] ?[place], [place] [address] ?  ?Glucose, capillary     Status: Abnormal  ? Collection Time: 04/28/21  4:12 PM  ?Result Value Ref Range  ? Glucose-Capillary 224 (H) 70 - 99 mg/dL  ?  Comment: Glucose reference range applies only to samples taken after fasting for at least 8 hours.  ? Comment 1 Notify  RN   ? Comment 2 Document in Chart   ?Glucose, capillary     Status: Abnormal  ? Collection Time: 04/28/21 10:51 PM  ?Result Value Ref Range  ? Glucose-Capillary 307 (H) 70 - 99 mg/dL  ?  Comment: Gluco

## 2021-07-28 ENCOUNTER — Ambulatory Visit (INDEPENDENT_AMBULATORY_CARE_PROVIDER_SITE_OTHER): Payer: Medicare HMO

## 2021-07-28 DIAGNOSIS — I5032 Chronic diastolic (congestive) heart failure: Secondary | ICD-10-CM | POA: Diagnosis not present

## 2021-07-28 DIAGNOSIS — Z95810 Presence of automatic (implantable) cardiac defibrillator: Secondary | ICD-10-CM | POA: Diagnosis not present

## 2021-07-29 ENCOUNTER — Other Ambulatory Visit (HOSPITAL_COMMUNITY): Payer: Self-pay

## 2021-07-29 ENCOUNTER — Other Ambulatory Visit: Payer: Self-pay | Admitting: Family Medicine

## 2021-07-29 DIAGNOSIS — E1140 Type 2 diabetes mellitus with diabetic neuropathy, unspecified: Secondary | ICD-10-CM | POA: Diagnosis not present

## 2021-07-30 ENCOUNTER — Other Ambulatory Visit (HOSPITAL_COMMUNITY): Payer: Self-pay

## 2021-07-30 MED ORDER — OZEMPIC (2 MG/DOSE) 8 MG/3ML ~~LOC~~ SOPN
PEN_INJECTOR | SUBCUTANEOUS | 2 refills | Status: DC
  Filled 2021-07-30: qty 3, 28d supply, fill #0
  Filled 2021-08-27: qty 3, 28d supply, fill #1
  Filled 2021-09-23: qty 3, 28d supply, fill #2

## 2021-07-30 NOTE — Progress Notes (Signed)
EPIC Encounter for ICM Monitoring ? ?Patient Name: [name] is a 71 y.o. female ?Date: 07/30/2021 ?Primary Care Physican: Maury Dus, MD ?Primary Cardiologist: Croitoru ?Electrophysiologist: Croitoru ?06/24/2021 Weight: 172-175 lbs ?07/30/2021 Weight: 174 lbs ?  ?Clinical Status   Since 01-Jul-2021 ?AT/AF 12 ?Time in AT/AF <0.1 hr/day (0.1%) ?Longest AT/AF 31 minutes ?                             ?  ?       Spoke with patient and heart failure questions reviewed.  Pt asymptomatic for fluid accumulation.  Reports feeling well at this time and voices no complaints.  Continues to strengthen knee post total knee replacement.  ?  ?Optivol thoracic impedance suggesting normal fluid levels since 4/6. ?  ?Prescribed dosage:  ?Torsemide 20 mg 1 tablet daily.   ?Potassium 20 mEq 1 tablet by mouth as directed. ?  ?Labs: ?04/29/2021 Creatinine 1.14, BUN 22, Potassium 4.0, Sodium 133, GFR 52 ?04/15/2021 Creatinine 1.26, BUN 21, Potassium 4.6, Sodium 138, GFR 46 ?09/19/2020 Creatinine 1.31, BUN 23, Potassium 4.4, Sodium 142, GFR 44 ?08/30/2020 Creatinine 1.32, BUN 18, Potassium 4.1, Sodium 140, GFR 44 ?07/23/2020 Creatinine 1.11, BUN 17, Potassium 4.1, Sodium 139, GFR 54 ?07/11/2020 Creatinine 1.31, BUN 16, Potassium 4.8, Sodium 139, GFR 44 ?06/04/2020 Creatinine 1.28, BUN 21, Potassium 4.3, Sodium 141  ?A complete set of results can be found in Results Review. ?  ?Recommendations:  No changes and encouraged to call if experiencing any fluid symptoms. ?  ?Follow-up plan: ICM clinic phone appointment on 09/01/2021.   91 day device clinic remote transmission 08/20/2021.   ?  ?Next office visit scheduled:  Recall 10/18/2021 for Dr Royann Shivers.   ?  ?Copy of ICM check sent to Dr. Royann Shivers.      ? ?3 month ICM trend: 07/28/2021. ? ? ? ?12-14 Month ICM trend:  ? ? ? ?[name], RN ?07/30/2021 ?3:02 PM ? ?

## 2021-08-02 NOTE — Progress Notes (Signed)
? ? ?  SUBJECTIVE:  ? ?CHIEF COMPLAINT / HPI:  ? ?Type 2 Diabetes ?Current meds: Farxiga 10mg  daily, Tresiba 40u daily, Ozempic 2mg  weekly, Acarbose 50mg  TID ?Excellent medication compliance-- never misses doses ?Last A1c: 7.4% on 04/16/2021 ?Checks sugars at home twice daily-- 90s-100s fasting, 140s-150s postprandial ?No hypoglycemic symptoms/episodes ?On statin ?UTD on foot exam ?Reports she had eye exam in the past 6 months ?Last BMP 04/29/2021 ? ?PERTINENT  PMH / PSH: CKD stage IIIa, HTN, a-fib, s/p ICD ? ?OBJECTIVE:  ? ?BP 116/75   Pulse 64   Ht 5\' 6"  (1.676 m)   Wt 178 lb 6.4 oz (80.9 kg)   LMP 07/26/2011   SpO2 100%   BMI 28.79 kg/m?   ?General: NAD, pleasant, able to participate in exam ?Cardiac: RRR, S1 S2 present. normal heart sounds, no murmurs. ?Respiratory: CTAB, normal effort, No wheezes, rales or rhonchi ?Extremities: well healed linear scar on right anterior knee, mild diffuse edema of right knee (normal post-op), no pretibial edema ?Skin: warm and dry ?Neuro: alert, no obvious focal deficits ?Psych: Normal affect and mood ? ? ?ASSESSMENT/PLAN:  ? ?Type 2 diabetes mellitus with diabetic neuropathy, with long-term current use of insulin ([place]) ?Well-controlled. A1c 6.9% today. ?-Discontinue Acarbose ?-Continue Farxiga 10mg  daily, Tresiba 40u daily, and Ozempic 2mg  weekly ?-Patient will have eye doctor fax notes from eye exam to our office ?-obtain updated lipid panel today ?-f/u in 3 months ?  ? ?Alcus Dad, MD ?[place]  ?

## 2021-08-05 ENCOUNTER — Ambulatory Visit (INDEPENDENT_AMBULATORY_CARE_PROVIDER_SITE_OTHER): Payer: Medicare HMO | Admitting: Family Medicine

## 2021-08-05 ENCOUNTER — Encounter: Payer: Self-pay | Admitting: Family Medicine

## 2021-08-05 VITALS — BP 116/75 | HR 64 | Ht 66.0 in | Wt 178.4 lb

## 2021-08-05 DIAGNOSIS — E1140 Type 2 diabetes mellitus with diabetic neuropathy, unspecified: Secondary | ICD-10-CM

## 2021-08-05 DIAGNOSIS — I10 Essential (primary) hypertension: Secondary | ICD-10-CM | POA: Diagnosis not present

## 2021-08-05 DIAGNOSIS — Z794 Long term (current) use of insulin: Secondary | ICD-10-CM | POA: Diagnosis not present

## 2021-08-05 LAB — POCT GLYCOSYLATED HEMOGLOBIN (HGB A1C): HbA1c, POC (controlled diabetic range): 6.9 % (ref 0.0–7.0)

## 2021-08-05 NOTE — Assessment & Plan Note (Signed)
Well-controlled. A1c 6.9% today. ?-Discontinue Acarbose ?-Continue [place] 10mg  daily, Tresiba 40u daily, and Ozempic 2mg  weekly ?-Patient will have eye doctor fax notes from eye exam to our office ?-obtain updated lipid panel today ?-f/u in 3 months ?

## 2021-08-05 NOTE — Patient Instructions (Signed)
It was great to see you! ? ?Your A1c was 6.9% today, which means your diabetes is very well controlled. ? ?You should stop taking your acarbose. Continue your other medications as prescribed. ? ?We are checking your cholesterol levels. We will send you a MyChart message with the results or call if they are abnormal.  ? ?Please have your eye doctor fax the notes from your diabetic eye exam to our office at [phone] ? ?Take care and seek immediate care sooner if you develop any concerns. ? ?Dr. Rock Nephew ?[facility]  ?

## 2021-08-06 LAB — LIPID PANEL
Chol/HDL Ratio: 2 ratio (ref 0.0–4.4)
Cholesterol, Total: 110 mg/dL (ref 100–199)
HDL: 55 mg/dL (ref >39)
LDL Chol Calc (NIH): 42 mg/dL (ref 0–99)
Triglycerides: 57 mg/dL (ref 0–149)
VLDL Cholesterol Cal: 13 mg/dL (ref 5–40)

## 2021-08-13 ENCOUNTER — Other Ambulatory Visit (HOSPITAL_COMMUNITY): Payer: Self-pay

## 2021-08-15 ENCOUNTER — Other Ambulatory Visit (HOSPITAL_COMMUNITY): Payer: Self-pay

## 2021-08-15 ENCOUNTER — Other Ambulatory Visit: Payer: Self-pay | Admitting: Family Medicine

## 2021-08-15 MED ORDER — FAMOTIDINE 20 MG PO TABS
20.0000 mg | ORAL_TABLET | Freq: Every day | ORAL | 0 refills | Status: DC
  Filled 2021-08-15: qty 90, 90d supply, fill #0

## 2021-08-18 ENCOUNTER — Other Ambulatory Visit (HOSPITAL_COMMUNITY): Payer: Self-pay

## 2021-08-20 ENCOUNTER — Ambulatory Visit (INDEPENDENT_AMBULATORY_CARE_PROVIDER_SITE_OTHER): Payer: Medicare HMO

## 2021-08-20 DIAGNOSIS — G4733 Obstructive sleep apnea (adult) (pediatric): Secondary | ICD-10-CM | POA: Diagnosis not present

## 2021-08-20 DIAGNOSIS — J449 Chronic obstructive pulmonary disease, unspecified: Secondary | ICD-10-CM | POA: Diagnosis not present

## 2021-08-20 DIAGNOSIS — I472 Ventricular tachycardia, unspecified: Secondary | ICD-10-CM

## 2021-08-20 DIAGNOSIS — J45909 Unspecified asthma, uncomplicated: Secondary | ICD-10-CM | POA: Diagnosis not present

## 2021-08-21 LAB — CUP PACEART REMOTE DEVICE CHECK
Battery Remaining Longevity: 49 mo
Battery Voltage: 2.97 V
Brady Statistic AP VP Percent: 3.11 %
Brady Statistic AP VS Percent: 94.1 %
Brady Statistic AS VP Percent: 0.01 %
Brady Statistic AS VS Percent: 2.78 %
Brady Statistic RA Percent Paced: 94.48 %
Brady Statistic RV Percent Paced: 3.18 %
Date Time Interrogation Session: 20230510022823
HighPow Impedance: 68 Ohm
Implantable Lead Implant Date: 20120120
Implantable Lead Implant Date: 20120120
Implantable Lead Location: 753859
Implantable Lead Location: 753860
Implantable Lead Model: 5076
Implantable Lead Model: 6935
Implantable Pulse Generator Implant Date: 20190618
Lead Channel Impedance Value: 342 Ohm
Lead Channel Impedance Value: 399 Ohm
Lead Channel Impedance Value: 437 Ohm
Lead Channel Pacing Threshold Amplitude: 0.375 V
Lead Channel Pacing Threshold Amplitude: 0.625 V
Lead Channel Pacing Threshold Pulse Width: 0.4 ms
Lead Channel Pacing Threshold Pulse Width: 0.4 ms
Lead Channel Sensing Intrinsic Amplitude: 1.625 mV
Lead Channel Sensing Intrinsic Amplitude: 1.625 mV
Lead Channel Sensing Intrinsic Amplitude: 10.25 mV
Lead Channel Sensing Intrinsic Amplitude: 10.25 mV
Lead Channel Setting Pacing Amplitude: 2 V
Lead Channel Setting Pacing Amplitude: 2.5 V
Lead Channel Setting Pacing Pulse Width: 0.4 ms
Lead Channel Setting Sensing Sensitivity: 0.3 mV

## 2021-08-27 ENCOUNTER — Other Ambulatory Visit (HOSPITAL_COMMUNITY): Payer: Self-pay

## 2021-09-01 ENCOUNTER — Ambulatory Visit (INDEPENDENT_AMBULATORY_CARE_PROVIDER_SITE_OTHER): Payer: Medicare HMO

## 2021-09-01 DIAGNOSIS — I5032 Chronic diastolic (congestive) heart failure: Secondary | ICD-10-CM | POA: Diagnosis not present

## 2021-09-01 DIAGNOSIS — Z95810 Presence of automatic (implantable) cardiac defibrillator: Secondary | ICD-10-CM | POA: Diagnosis not present

## 2021-09-01 NOTE — Progress Notes (Signed)
Remote ICD transmission.   

## 2021-09-04 NOTE — Progress Notes (Signed)
EPIC Encounter for ICM Monitoring  Patient Name: [name] is a 70 y.o. female Date: 09/04/2021 Primary Care Physican: Maury Dus, MD Primary Cardiologist: Croitoru Electrophysiologist: Croitoru 06/24/2021 Weight: 172-175 lbs 07/30/2021 Weight: 174 lbs 09/04/2021 Weight: 180 lbs   Clinical Status   Since 20-Aug-2021 AT/AF              21 Time in AT/AF 0.6 hr/day (2.7%) Longest AT/AF 88 minutes                                       Spoke with patient and heart failure questions reviewed.  Pt asymptomatic for fluid accumulation.  Reports feeling well at this time and voices no complaints.  Riding exercise bike for knee strengthening.   Optivol thoracic impedance suggesting normal fluid levels.   Prescribed dosage:  Torsemide 20 mg 1 tablet daily.   Potassium 20 mEq 1 tablet by mouth as directed.   Labs: 04/29/2021 Creatinine 1.14, BUN 22, Potassium 4.0, Sodium 133, GFR 52 04/15/2021 Creatinine 1.26, BUN 21, Potassium 4.6, Sodium 138, GFR 46 09/19/2020 Creatinine 1.31, BUN 23, Potassium 4.4, Sodium 142, GFR 44 08/30/2020 Creatinine 1.32, BUN 18, Potassium 4.1, Sodium 140, GFR 44 07/23/2020 Creatinine 1.11, BUN 17, Potassium 4.1, Sodium 139, GFR 54 07/11/2020 Creatinine 1.31, BUN 16, Potassium 4.8, Sodium 139, GFR 44 06/04/2020 Creatinine 1.28, BUN 21, Potassium 4.3, Sodium 141  A complete set of results can be found in Results Review.   Recommendations:  No changes and encouraged to call if experiencing any fluid symptoms.   Follow-up plan: ICM clinic phone appointment on 10/06/2021.   91 day device clinic remote transmission 11/19/2021.     Next office visit scheduled:  Recall 10/18/2021 for Dr Royann Shivers.     Copy of ICM check sent to Dr. Royann Shivers.    3 month ICM trend: 09/01/2021.    12-14 Month ICM trend:     [name], RN 09/04/2021 8:55 AM

## 2021-09-09 ENCOUNTER — Other Ambulatory Visit (HOSPITAL_COMMUNITY): Payer: Self-pay

## 2021-09-16 ENCOUNTER — Encounter: Payer: Self-pay | Admitting: *Deleted

## 2021-09-17 ENCOUNTER — Other Ambulatory Visit (HOSPITAL_COMMUNITY): Payer: Self-pay

## 2021-09-17 ENCOUNTER — Other Ambulatory Visit: Payer: Self-pay | Admitting: Cardiovascular Disease

## 2021-09-18 ENCOUNTER — Other Ambulatory Visit (HOSPITAL_COMMUNITY): Payer: Self-pay

## 2021-09-18 MED ORDER — BISOPROLOL FUMARATE 10 MG PO TABS
10.0000 mg | ORAL_TABLET | Freq: Two times a day (BID) | ORAL | 1 refills | Status: DC
  Filled 2021-09-18: qty 180, 90d supply, fill #0
  Filled 2021-12-16: qty 180, 90d supply, fill #1

## 2021-09-19 ENCOUNTER — Other Ambulatory Visit (HOSPITAL_COMMUNITY): Payer: Self-pay

## 2021-09-23 ENCOUNTER — Other Ambulatory Visit: Payer: Self-pay | Admitting: Pulmonary Disease

## 2021-09-23 ENCOUNTER — Other Ambulatory Visit (HOSPITAL_COMMUNITY): Payer: Self-pay

## 2021-09-23 MED ORDER — MONTELUKAST SODIUM 10 MG PO TABS
10.0000 mg | ORAL_TABLET | Freq: Every day | ORAL | 0 refills | Status: DC
  Filled 2021-09-23: qty 90, 90d supply, fill #0

## 2021-10-06 ENCOUNTER — Ambulatory Visit (INDEPENDENT_AMBULATORY_CARE_PROVIDER_SITE_OTHER): Payer: Medicare HMO

## 2021-10-06 DIAGNOSIS — I5032 Chronic diastolic (congestive) heart failure: Secondary | ICD-10-CM | POA: Diagnosis not present

## 2021-10-06 DIAGNOSIS — Z95810 Presence of automatic (implantable) cardiac defibrillator: Secondary | ICD-10-CM

## 2021-10-10 NOTE — Progress Notes (Signed)
EPIC Encounter for ICM Monitoring  Patient Name: [name] is a 71 y.o. female Date: 10/10/2021 Primary Care Physican: Maury Dus, MD Primary Cardiologist: Croitoru Electrophysiologist: Croitoru 06/24/2021 Weight: 172-175 lbs 07/30/2021 Weight: 174 lbs 09/04/2021 Weight: 180 lbs   Since 01-Sep-2021 AT/AF 53 Time in AT/AF 0.2 hr/day (0.9%) Longest AT/AF 55 minutes                                       Spoke with patient and heart failure questions reviewed.  Pt asymptomatic for fluid accumulation.  Reports feeling well at this time and voices no complaints.    Optivol thoracic impedance suggesting normal fluid levels.   Prescribed dosage:  Torsemide 20 mg 1 tablet daily.   Potassium 20 mEq 1 tablet by mouth as directed.   Labs: 04/29/2021 Creatinine 1.14, BUN 22, Potassium 4.0, Sodium 133, GFR 52 04/15/2021 Creatinine 1.26, BUN 21, Potassium 4.6, Sodium 138, GFR 46 09/19/2020 Creatinine 1.31, BUN 23, Potassium 4.4, Sodium 142, GFR 44 08/30/2020 Creatinine 1.32, BUN 18, Potassium 4.1, Sodium 140, GFR 44 07/23/2020 Creatinine 1.11, BUN 17, Potassium 4.1, Sodium 139, GFR 54 07/11/2020 Creatinine 1.31, BUN 16, Potassium 4.8, Sodium 139, GFR 44 06/04/2020 Creatinine 1.28, BUN 21, Potassium 4.3, Sodium 141  A complete set of results can be found in Results Review.   Recommendations:  No changes and encouraged to call if experiencing any fluid symptoms.   Follow-up plan: ICM clinic phone appointment on 11/10/2021.   91 day device clinic remote transmission 11/19/2021.     Next office visit scheduled:  10/20/2021 for Dr Royann Shivers.     Copy of ICM check sent to Dr. Royann Shivers.    3 month ICM trend: 10/06/2021.    12-14 Month ICM trend:     [name], RN 10/10/2021 9:18 AM

## 2021-10-20 ENCOUNTER — Other Ambulatory Visit: Payer: Self-pay | Admitting: Family Medicine

## 2021-10-20 ENCOUNTER — Ambulatory Visit (INDEPENDENT_AMBULATORY_CARE_PROVIDER_SITE_OTHER): Payer: Medicare HMO | Admitting: Cardiovascular Disease

## 2021-10-20 ENCOUNTER — Other Ambulatory Visit (HOSPITAL_COMMUNITY): Payer: Self-pay

## 2021-10-20 ENCOUNTER — Encounter: Payer: Self-pay | Admitting: Cardiovascular Disease

## 2021-10-20 VITALS — BP 114/66 | HR 81 | Ht 66.0 in | Wt 181.4 lb

## 2021-10-20 DIAGNOSIS — G4733 Obstructive sleep apnea (adult) (pediatric): Secondary | ICD-10-CM

## 2021-10-20 DIAGNOSIS — D6869 Other thrombophilia: Secondary | ICD-10-CM | POA: Diagnosis not present

## 2021-10-20 DIAGNOSIS — Z5181 Encounter for therapeutic drug level monitoring: Secondary | ICD-10-CM

## 2021-10-20 DIAGNOSIS — E1122 Type 2 diabetes mellitus with diabetic chronic kidney disease: Secondary | ICD-10-CM | POA: Diagnosis not present

## 2021-10-20 DIAGNOSIS — I10 Essential (primary) hypertension: Secondary | ICD-10-CM

## 2021-10-20 DIAGNOSIS — Z9989 Dependence on other enabling machines and devices: Secondary | ICD-10-CM

## 2021-10-20 DIAGNOSIS — Z79899 Other long term (current) drug therapy: Secondary | ICD-10-CM

## 2021-10-20 DIAGNOSIS — J449 Chronic obstructive pulmonary disease, unspecified: Secondary | ICD-10-CM | POA: Diagnosis not present

## 2021-10-20 DIAGNOSIS — E663 Overweight: Secondary | ICD-10-CM

## 2021-10-20 DIAGNOSIS — Z794 Long term (current) use of insulin: Secondary | ICD-10-CM

## 2021-10-20 DIAGNOSIS — I480 Paroxysmal atrial fibrillation: Secondary | ICD-10-CM | POA: Diagnosis not present

## 2021-10-20 DIAGNOSIS — E7800 Pure hypercholesterolemia, unspecified: Secondary | ICD-10-CM | POA: Diagnosis not present

## 2021-10-20 DIAGNOSIS — N1831 Chronic kidney disease, stage 3a: Secondary | ICD-10-CM

## 2021-10-20 DIAGNOSIS — I5032 Chronic diastolic (congestive) heart failure: Secondary | ICD-10-CM

## 2021-10-20 DIAGNOSIS — Z95810 Presence of automatic (implantable) cardiac defibrillator: Secondary | ICD-10-CM | POA: Diagnosis not present

## 2021-10-20 DIAGNOSIS — J4540 Moderate persistent asthma, uncomplicated: Secondary | ICD-10-CM | POA: Diagnosis not present

## 2021-10-20 DIAGNOSIS — I472 Ventricular tachycardia, unspecified: Secondary | ICD-10-CM

## 2021-10-20 DIAGNOSIS — J45909 Unspecified asthma, uncomplicated: Secondary | ICD-10-CM | POA: Diagnosis not present

## 2021-10-20 MED ORDER — OZEMPIC (2 MG/DOSE) 8 MG/3ML ~~LOC~~ SOPN
PEN_INJECTOR | SUBCUTANEOUS | 2 refills | Status: DC
  Filled 2021-10-20: qty 3, 28d supply, fill #0
  Filled 2021-11-13: qty 3, 28d supply, fill #1
  Filled 2021-12-12: qty 3, 28d supply, fill #2

## 2021-10-20 MED ORDER — TRESIBA FLEXTOUCH 100 UNIT/ML ~~LOC~~ SOPN
40.0000 [IU] | PEN_INJECTOR | Freq: Every day | SUBCUTANEOUS | 2 refills | Status: DC
  Filled 2021-10-20: qty 15, 37d supply, fill #0
  Filled 2021-11-22: qty 15, 37d supply, fill #1
  Filled 2021-12-29: qty 15, 37d supply, fill #2

## 2021-10-20 NOTE — Patient Instructions (Signed)

## 2021-10-21 ENCOUNTER — Other Ambulatory Visit (HOSPITAL_COMMUNITY): Payer: Self-pay

## 2021-10-21 ENCOUNTER — Other Ambulatory Visit: Payer: Self-pay | Admitting: *Deleted

## 2021-10-21 ENCOUNTER — Encounter: Payer: Self-pay | Admitting: Cardiovascular Disease

## 2021-10-21 DIAGNOSIS — I480 Paroxysmal atrial fibrillation: Secondary | ICD-10-CM

## 2021-10-21 DIAGNOSIS — I5032 Chronic diastolic (congestive) heart failure: Secondary | ICD-10-CM

## 2021-10-21 NOTE — Progress Notes (Signed)
Patient ID: [name], female   DOB: 09/05/1950, 71 y.o.   MRN: [id number] Patient ID: [name], female   DOB: 1950-11-05, 71 y.o.   MRN: [id number]    Cardiology Office Note    Date:  10/21/2021   ID:  [name], DOB November 11, 1950, MRN [id number]  PCP:  Maury Dus, MD  Cardiologist:   Thurmon Fair, MD   No chief complaint on file.   History of Present Illness:  [name] is a 71 y.o. female with a long-standing history of nonischemic cardiomyopathy, chronic systolic heart failure and paroxysmal atrial fibrillation, s/p ICD (Medtronic, gen change 2019).    She has had a good year without any episodes of heart failure exacerbation or hospitalizations.  Doing much better after recovering from her knee surgery in January.  She is working hard on losing weight.  She is down 63 pounds from her maximum weight.  She started going to the gym 2 days a week and has a combination of stationary bicycle and arm weights.  The patient specifically denies any chest pain at rest exertion, dyspnea at rest or with exertion, orthopnea, paroxysmal nocturnal dyspnea, syncope, palpitations, focal neurological deficits, intermittent claudication, lower extremity edema, unexplained weight gain, cough, hemoptysis or wheezing.  Reports compliance with CPAP and denies daytime hypersomnolence.  On dofetilide, she has had a very low burden of atrial fibrillation (1.6%) but most of this was concentrated around the time of her postop period.  During that same time she had a slight increase in the burden of nonsustained ventricular tachycardia and her OptiVol was high consistent with volume overload, probably from IV fluids.  All of these abnormalities have since corrected.  Activity is now up to 2.6 hours a day, which was her baseline before surgery.  ICD function is otherwise normal.  All lead parameters are good.  Estimated generator longevity is just under 4 years.  There is 95% atrial pacing with good heart  rate histogram and is only 1.8% ventricular pacing.  The atrial fibrillation burden is 1.6%.  A lipid profile performed a couple of months ago showed excellent values with LDL 42 and HDL 55.  Her hemoglobin A1c was also great at 6.9%.  She is on SGLT2 inhibitors, GLP-1 agonist and metformin.   She had antitachycardia pacing for ventricular tachycardia in December 2016 and again in April 2022. Since then a handful of episodes of nonsustained VT have been recorded, none meeting criteria for treatment. On July 19, 2020 she had very fast VT (CL 280 ms), mostly monomorphic. ATP corrected it for one beat, but then it recurred. A second burst of ATP stopped the arrhythmia. The episode was mildly symptomatic. No recurrence since. Labs were normal.  She is compliant with CPAP for obstructive sleep apnea.  She has a history of moderate nonischemic cardiomyopathy (no coronary disease by cardiac catheterization January 2012), recurrent paroxysmal atrial fibrillation status post 2 ablation procedures (Dr. Orson Aloe), with history of ventricular fibrillation arrest while on treatment with diltiazem and dofetilide and a prolonged QT interval, probably related to simultaneous quinolone therapy. She has a dual-chamber Medtronic Protecta defibrillator implanted in January 2012. In December 2016 her defibrillator recorded an episode of sustained monomorphic ventricular tachycardia. Antitachycardia pacing was unsuccessful at converting the arrhythmia, but she had a "dirty break" before defibrillator shock was delivered. The shock was aborted. Polymorphic VT lasting 2 seconds was recorded in the fall of 2017 during asthma exacerbation, but did not require intervention. Had sustained  monomorphic VT on July 19, 2020. ATP successfully terminated the arrhythmia.  Most recent echo June 2020 showed normal LVEF 55-60%, but reduced GLS -14%. At LV angiography in 2012, EF was 15-20%.  Past Medical History:  Diagnosis Date   AICD  (automatic cardioverter/defibrillator) present    Anemia    Anxiety    Arthritis    Arthritis of carpometacarpal (CMC) joint of thumb 03/26/2016   Asthma    has had multiple hospitalizations for this   Asthma, moderate persistent 12/10/2011   Atrial fibrillation (HCC)    ablation x 2 [facility], 01/2006, 2011.  on warfarin   Cardiac arrest (HCC) 04/2010   in hospital for pneumonia when this occured- occured at the hospital   CHF (congestive heart failure) (HCC) 2012   Echo 08/08/10 by [facility]. EF 35-45%. LV systolic function moderately reduced. Moderate global hypokinesis of LV.  RV systolic function moderately reduced. Mild MR. Trace AR.   Chronic diastolic heart failure (HCC) 01/07/2018   CKD (chronic kidney disease) stage 2, GFR 60-89 ml/min 02/22/2012   Her cr range 1.2-1.5 since Jan 2012 after hospitalization. 10/13 Estimated Creatinine Clearance: 69.3 ml/min (by C-G formula based on Cr of 1).     Complication of anesthesia    difficult time waking up after anesthesia   Depression    Diabetes mellitus    Type 2   Diabetic neuropathy (HCC) 09/18/2011   GASTROESOPHAGEAL REFLUX, NO ESOPHAGITIS 06/10/2006   Qualifier: Diagnosis of  By: Abundio Miu     GERD (gastroesophageal reflux disease) 03/23/2018   History of hiatal hernia    History of kidney stones    HYPERCHOLESTEROLEMIA 06/10/2006   LDL 93 at 01/2011 check-  Continue pravastatin 20mg  po daily.  --discuss health modifications and possibly increaseing dose at next appt.  Cardiology- Dr little- stopped pravachol on 05/21/11- 2/2 allergies?     Hyperlipidemia    Hypertension    ICD (St. Jude Protecta dual-chamber),secondary prevention (VF arrest) January 2012 11/19/2012   Iliotibial band syndrome, left 11/01/2018   Limb pain 06/04/2008   LLE, Baker's cyst in popliteal fossa, no DVT   Long term current use of anticoagulant therapy 08/21/2015   Non-ischemic cardiomyopathy (HCC)    echo 08/08/10 - EF 35-45% LV and RV  systolic function mod reduced   Obesity (BMI 30-39.9) 01/04/2014   OSA on CPAP 06/10/2006   Sleep study 02/2014 : severe apnea, corrected with nasal pillows and 8cmh2o     Osteoarthritis of right knee 08/28/2008   Qualifier: Diagnosis of  By: 08/30/2008 MD, Pearletha Forge     Primary localized osteoarthritis of right knee    Sleep apnea    wears CPAP nightly   Type 2 diabetes mellitus with diabetic neuropathy, with long-term current use of insulin (HCC) 06/10/2006              Ventricular tachycardia (HCC) 05/22/2015   Visit for monitoring Tikosyn therapy 08/21/2015   Vocal cord disease     Past Surgical History:  Procedure Laterality Date   BREAST EXCISIONAL BIOPSY Right 1999   BREAST LUMPECTOMY Right    CARDIAC DEFIBRILLATOR PLACEMENT  05/02/10   Medtronic Protecta XT-DR for CHF-VT, last download 04/12/12   CHOLECYSTECTOMY     COLONOSCOPY     HAMMER TOE SURGERY Right    ICD GENERATOR CHANGEOUT N/A 09/28/2017   Procedure: ICD GENERATOR CHANGEOUT;  Surgeon: 09/30/2017, MD;  Location: [facility];  Service: Cardiovascular;  Laterality: N/A;   KNEE  ARTHROSCOPY Right    PAF Ablation     By Dr Ola Spurr. Now sees Dr Tilden Dome at [place] Left 09/17/2014   Procedure: TOTAL KNEE ARTHROPLASTY;  Surgeon: Elsie Saas, MD;  Location: [facility];  Service: Orthopedics;  Laterality: Left;  pt has ICD   TOTAL KNEE ARTHROPLASTY Right 04/28/2021   Procedure: TOTAL KNEE ARTHROPLASTY;  Surgeon: Willaim Sheng, MD;  Location: [facility];  Service: Orthopedics;  Laterality: Right;   TUBAL LIGATION      Outpatient Medications Prior to Visit  Medication Sig Dispense Refill   acetaminophen (TYLENOL 8 HOUR) 650 MG CR tablet Take 1 tablet (650 mg total) by mouth every 8 (eight) hours as needed for pain. 90 tablet 2   apixaban (ELIQUIS) 5 MG TABS tablet TAKE 1 TABLET (5 MG) BY MOUTH TWICE DAILY 180 tablet 1   bisoprolol (ZEBETA) 10 MG tablet Take 1 tablet (10 mg total) by mouth in  the morning and at bedtime. 180 tablet 1   budesonide-formoterol (SYMBICORT) 80-4.5 MCG/ACT inhaler INHALE 2 PUFFS INTO THE LUNGS IN THE MORNING AND AT BEDTIME 10.2 g 11   buPROPion (WELLBUTRIN XL) 150 MG 24 hr tablet Take 1 tablet (150 mg total) by mouth daily. 90 tablet 3   dapagliflozin propanediol (FARXIGA) 10 MG TABS tablet Take 1 tablet (10 mg total) by mouth daily. 90 tablet 3   diclofenac Sodium (VOLTAREN) 1 % GEL Apply 2 g topically 4 (four) times daily. 100 g 1   dofetilide (TIKOSYN) 250 MCG capsule TAKE 1 CAPSULE  BY MOUTH TWICE DAILY 180 capsule 3   famotidine (PEPCID) 20 MG tablet Take 1 tablet (20 mg total) by mouth daily. 90 tablet 0   fluticasone (FLONASE) 50 MCG/ACT nasal spray instill 1 spray into each nostril twice a day 16 g 5   glucose blood test strip Use as instructed 100 each 12   Insulin Pen Needle (BD PEN NEEDLE NANO 2ND GEN) 32G X 4 MM MISC Use as directed with insulin once daily 100 each 2   levalbuterol (XOPENEX HFA) 45 MCG/ACT inhaler INHALE 1 TO 2 PUFFS BY MOUTH INTO THE LUNGS EVERY 4 HOURS IF NEEDED FOR WHEEZING 1 Inhaler 1   levalbuterol (XOPENEX) 0.63 MG/3ML nebulizer solution One vial in nebulizer four times daily as needed 360 mL 5   montelukast (SINGULAIR) 10 MG tablet TAKE 1 TABLET BY MOUTH AT BEDTIME 90 tablet 0   potassium chloride SA (KLOR-CON M) 20 MEQ tablet Take 1 tablet by mouth daily as directed. 30 tablet 10   PRODIGY LANCETS 28G MISC 1 Units by Does not apply route 4 (four) times daily. 100 each 4   rosuvastatin (CRESTOR) 10 MG tablet TAKE 1 TABLET BY MOUTH DAILY 90 tablet 2   torsemide (DEMADEX) 20 MG tablet Take 1 tablet (20 mg total) by mouth daily. 90 tablet 2   insulin degludec (TRESIBA FLEXTOUCH) 100 UNIT/ML FlexTouch Pen Inject 40 Units into the skin daily. 15 mL 2   Semaglutide, 2 MG/DOSE, (OZEMPIC, 2 MG/DOSE,) 8 MG/3ML SOPN INJECT 2MG  UNDER THE SKIN WEEKLY 3 mL 2   No facility-administered medications prior to visit.     Allergies:    Avelox [moxifloxacin hcl in nacl], Nsaids, Simvastatin, Ace inhibitors, Jardiance [empagliflozin], and Latex   Social History   Socioeconomic History   Marital status: Widowed    Spouse name: Not on file   Number of children: Not on file   Years of education: Not on file  Highest education level: Not on file  Occupational History   Not on file  Tobacco Use   Smoking status: Never   Smokeless tobacco: Never  Vaping Use   Vaping Use: Never used  Substance and Sexual Activity   Alcohol use: No   Drug use: No   Sexual activity: Never  Other Topics Concern   Not on file  Social History Narrative   Not employed   Exercise- walking 1 hour daily.    Diet- eating healthy diet.        Pulmonary:   She is originally from [facility]. Has always lived in [place]. 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 Currently has a small dog. No bird or mold exposure.       Current Social History 04/08/2017        Patient lives with daughter, Beverlee Nims, in two level home 04/08/2017   Transportation: Patient has own vehicle and drives herself [id number]   Important Relationships Daughter, Beverlee Nims 04/08/2017    Pets: Shiatzu Tempie Donning)  04/08/2017   Education / Work:  10 th grade/ None 04/08/2017   Interests / Fun: Read, do puzzles 04/08/2017   Current Stressors: None because she prays to God 04/08/2017   Religious / Personal Beliefs: Non-Denominational 04/08/2017   L. Ducatte, RN, BSN                                                                                                 Social Determinants of Health   Financial Resource Strain: Not on file  Food Insecurity: Not on file  Transportation Needs: Not on file  Physical Activity: Not on file  Stress: Not on file  Social Connections: Not on file     Family History:  The patient's family history includes Diabetes in her brother and father;  Hypertension in her brother and father.   ROS:   Please see the history of present illness.    Otherwise negative ROS  PHYSICAL EXAM:   VS:  BP 114/66 (BP Location: Left Arm, Patient Position: Sitting, Cuff Size: Large)   Pulse 81   Ht 5\' 6"  (1.676 m)   Wt 181 lb 6.4 oz (82.3 kg)   LMP 07/26/2011   SpO2 96%   BMI 29.28 kg/m      General: Alert, oriented x3, no distress, healthy subclavian ICD site.  Overweight. Head: no evidence of trauma, PERRL, EOMI, no exophtalmos or lid lag, no myxedema, no xanthelasma; normal ears, nose and oropharynx Neck: normal jugular venous pulsations and no hepatojugular reflux; brisk carotid pulses without delay and no carotid bruits Chest: clear to auscultation, no signs of consolidation by percussion or palpation, normal fremitus, symmetrical and full respiratory excursions Cardiovascular: normal position and quality of the apical impulse, regular rhythm, normal first and second heart sounds, no murmurs, rubs or gallops Abdomen: no tenderness or distention, no masses by palpation, no abnormal pulsatility or arterial bruits, normal bowel sounds, no hepatosplenomegaly Extremities: no clubbing, cyanosis or edema; 2+ radial, ulnar and brachial pulses  bilaterally; 2+ right femoral, posterior tibial and dorsalis pedis pulses; 2+ left femoral, posterior tibial and dorsalis pedis pulses; no subclavian or femoral bruits Neurological: grossly nonfocal Psych: Normal mood and affect    Wt Readings from Last 3 Encounters:  10/20/21 181 lb 6.4 oz (82.3 kg)  08/05/21 178 lb 6.4 oz (80.9 kg)  05/29/21 180 lb 4.8 oz (81.8 kg)      Studies/Labs Reviewed:   ECHO 09/19/2018  1. The left ventricle has normal systolic function, with an ejection fraction of 55-60%. The cavity size was normal. There is mild asymmetric left ventricular hypertrophy. Left ventricular diastolic Doppler parameters are consistent with impaired  relaxation. No evidence of left ventricular  regional wall motion abnormalities.  2. The average left ventricular global longitudinal strain is 14.9 %.  3. The right ventricle has normal systolic function. The cavity was normal. There is no increase in right ventricular wall thickness.  4. Left atrial size was mildly dilated.  5. The aortic valve is tricuspid. Mild sclerosis of the aortic valve. Aortic valve regurgitation is trivial by color flow Doppler.    EKG:  EKG is ordered today.  It shows atrial paced, ventricular sensed rhythm with a slightly prolonged AV delay of 210 ms but is otherwise normal.  QTc is 492 ms (on dofetilide).  Recent Labs: 04/15/2021: ALT 59 04/29/2021: BUN 22; Creatinine, Ser 1.14; Potassium 4.0; Sodium 133 04/30/2021: Hemoglobin 11.2; Platelets 174   Lipid Panel    Component Value Date/Time   CHOL 110 08/05/2021 1118   TRIG 57 08/05/2021 1118   HDL 55 08/05/2021 1118   CHOLHDL 2.0 08/05/2021 1118   CHOLHDL 2.4 04/21/2016 1438   VLDL 15 04/21/2016 1438   LDLCALC 42 08/05/2021 1118   LDLDIRECT 94 12/07/2007 2019     ASSESSMENT:    1. Ventricular tachycardia (HCC)   2. Paroxysmal atrial fibrillation ([place])   3. Chronic diastolic heart failure ([place])   4. ICD (implantable cardioverter-defibrillator) in place   5. Acquired thrombophilia ([place])   6. Essential hypertension   7. Hypercholesterolemia   8. Overweight   9. OSA on CPAP   10. Encounter for monitoring dofetilide therapy   11. Moderate persistent asthma without complication   12. Type 2 diabetes mellitus with stage 3a chronic kidney disease, with long-term current use of insulin ([place])   13. Stage 3a chronic kidney disease (HCC)      VT: Had a few episodes of nonsustained VT in the immediate postoperative period after knee replacement when she also had evidence of fluid overload and a high burden of atrial fibrillation.  Otherwise has very infrequent and brief episodes of nonsustained VT.   Required ICD intervention twice in 2016 and 2022,  both times monomorphic VT resolved with with ATP, despite marked improvement in left ventricular systolic function.  She is also at risk of developing torsades while in treatment with dofetilide, but QT is in appropriate range. Ongoing ICD therapy is appropriate despite recovered EF. PAF: Similarly had a slight increase in burden of atrial fibrillation just after knee surgery, but the burden is now back down to her usual baseline which is 1% or less.  She has never been aware of this arrhythmia..  On dofetilide and bisoprolol.  On appropriate anticoagulation.  CHA2DS2-VASc 5 (CHF, gender, age, hypertension, diabetes mellitus). CHF: Transient hypervolemia after knee surgery, has recovered to normal range.  Clinically also appears euvolemic.  NYHA functional class I, on a very low dose of daily loop diuretic.  She continues to lose a lot of true weight, more than 60 pounds total from her peak weight.  We will have to keep adjusting her "dry weight" estimates.  On maximum tolerated doses of beta-blocker and on SGLT2 inhibitor.  Echo shows recovered EF at 55 to 60% so she is not on Entresto.  NYHA functional class I, not on aldosterone antagonist. ICD: Normal device function.  Continue remote downloads every 3 months. Anticoagulation: No bleeding complications. HTN: Excellent control.  History of cough w ACEi. HLP: All lipid parameters in target range.  Continue current statin prescription. Overweight: Excellent job with weight loss, no longer obese. OSA: Compliant with CPAP and denies daytime hypersomnolence. Tikosyn: TC interval was prolonged but in desirable range.  Normal potassium and renal function parameters (digits need to be rechecked every 6 months). Asthma: Tolerates bisoprolol well.  Other beta-blockers worsened wheezing in the past. DM: Markedly improved control with most recent hemoglobin A1c down to 6.9%.  On SGLT2 inhibitor and GLP-1 agonist. CKD: Stable renal function, GFR 50-60.  Most  recent creatinine 1.14, improved.   For some reason her insurance charges more for 10 mg bisoprolol tabs (nonformulary) than it does for 25 mg.  PLAN:  In order of problems listed above:  Fascinating. Have not seen one there before. Medication Adjustments/Labs and Tests Ordered: Current medicines are reviewed at length with the patient today.  Concerns regarding medicines are outlined above.  Medication changes, Labs and Tests ordered today are listed in the Patient Instructions below. Patient Instructions  Medication Instructions:  No changes *If you need a refill on your cardiac medications before your next appointment, please call your pharmacy*   Lab Work: None ordered If you have labs (blood work) drawn today and your tests are completely normal, you will receive your results only by: [place] (if you have MyChart) OR A paper copy in the mail If you have any lab test that is abnormal or we need to change your treatment, we will call you to review the results.   Testing/Procedures: None ordered   Follow-Up: At [facility], you and your health needs are our priority.  As part of our continuing mission to provide you with exceptional heart care, we have created designated Provider Care Teams.  These Care Teams include your primary Cardiologist (physician) and Advanced Practice Providers (APPs -  Physician Assistants and Nurse Practitioners) who all work together to provide you with the care you need, when you need it.  We recommend signing up for the patient portal called "MyChart".  Sign up information is provided on this After Visit Summary.  MyChart is used to connect with patients for Virtual Visits (Telemedicine).  Patients are able to view lab/test results, encounter notes, upcoming appointments, etc.  Non-urgent messages can be sent to your provider as well.   To learn more about what you can do with MyChart, go to [email or web address].    Your next  appointment:   12 month(s)  The format for your next appointment:   In Person  Provider:   Sanda Klein, MD {    Important Information About Sugar           Signed, Sanda Klein, MD  10/21/2021 11:39 AM    [address], [place], [place]  [address] Phone: [phone]; Fax: [phone]

## 2021-10-22 ENCOUNTER — Other Ambulatory Visit: Payer: Self-pay | Admitting: Family Medicine

## 2021-10-22 DIAGNOSIS — Z1231 Encounter for screening mammogram for malignant neoplasm of breast: Secondary | ICD-10-CM

## 2021-10-27 ENCOUNTER — Other Ambulatory Visit: Payer: Medicare HMO

## 2021-10-27 ENCOUNTER — Ambulatory Visit: Payer: Medicare HMO | Admitting: Podiatry

## 2021-10-27 ENCOUNTER — Encounter: Payer: Self-pay | Admitting: Podiatry

## 2021-10-27 DIAGNOSIS — E1140 Type 2 diabetes mellitus with diabetic neuropathy, unspecified: Secondary | ICD-10-CM

## 2021-10-27 DIAGNOSIS — M2042 Other hammer toe(s) (acquired), left foot: Secondary | ICD-10-CM

## 2021-10-27 DIAGNOSIS — M79674 Pain in right toe(s): Secondary | ICD-10-CM | POA: Diagnosis not present

## 2021-10-27 DIAGNOSIS — M2041 Other hammer toe(s) (acquired), right foot: Secondary | ICD-10-CM | POA: Diagnosis not present

## 2021-10-27 DIAGNOSIS — Z794 Long term (current) use of insulin: Secondary | ICD-10-CM

## 2021-10-27 DIAGNOSIS — E119 Type 2 diabetes mellitus without complications: Secondary | ICD-10-CM

## 2021-10-27 DIAGNOSIS — M79675 Pain in left toe(s): Secondary | ICD-10-CM | POA: Diagnosis not present

## 2021-10-27 DIAGNOSIS — L84 Corns and callosities: Secondary | ICD-10-CM

## 2021-10-27 DIAGNOSIS — B351 Tinea unguium: Secondary | ICD-10-CM

## 2021-11-01 NOTE — Progress Notes (Signed)
ANNUAL DIABETIC FOOT EXAM  Subjective: Diane [name] presents today for annual diabetic foot examination.  Patient confirms h/o diabetes.  Patient denies any h/o foot wounds.  Patient's blood sugar was 112 mg/dl today. Last known  HgA1c was 6.9%   Risk factors: diabetes, diabetic neuropathy, HTN, CHF, CKD, hyperlipidemia, hypercholesterolemia.  Diane Dus, MD is patient's PCP. Last visit was August 05, 2021.  Past Medical History:  Diagnosis Date   AICD (automatic cardioverter/defibrillator) present    Anemia    Anxiety    Arthritis    Arthritis of carpometacarpal (CMC) joint of thumb 03/26/2016   Asthma    has had multiple hospitalizations for this   Asthma, moderate persistent 12/10/2011   Atrial fibrillation (HCC)    ablation x 2 [facility], 01/2006, 2011.  on warfarin   Cardiac arrest (HCC) 04/2010   in hospital for pneumonia when this occured- occured at the hospital   CHF (congestive heart failure) (HCC) 2012   Echo 08/08/10 by [facility]. EF 35-45%. LV systolic function moderately reduced. Moderate global hypokinesis of LV.  RV systolic function moderately reduced. Mild MR. Trace AR.   Chronic diastolic heart failure (HCC) 01/07/2018   CKD (chronic kidney disease) stage 2, GFR 60-89 ml/min 02/22/2012   Her cr range 1.2-1.5 since Jan 2012 after hospitalization. 10/13 Estimated Creatinine Clearance: 69.3 ml/min (by C-G formula based on Cr of 1).     Complication of anesthesia    difficult time waking up after anesthesia   Depression    Diabetes mellitus    Type 2   Diabetic neuropathy (HCC) 09/18/2011   GASTROESOPHAGEAL REFLUX, NO ESOPHAGITIS 06/10/2006   Qualifier: Diagnosis of  By: Abundio Miu     GERD (gastroesophageal reflux disease) 03/23/2018   History of hiatal hernia    History of kidney stones    HYPERCHOLESTEROLEMIA 06/10/2006   LDL 93 at 01/2011 check-  Continue pravastatin 20mg  po daily.  --discuss health modifications and possibly increaseing  dose at next appt.  Cardiology- Dr little- stopped pravachol on 05/21/11- 2/2 allergies?     Hyperlipidemia    Hypertension    ICD (St. Jude Protecta dual-chamber),secondary prevention (VF arrest) January 2012 11/19/2012   Iliotibial band syndrome, left 11/01/2018   Diane [name] 06/04/2008   LLE, Baker's cyst in popliteal fossa, no DVT   Long term current use of anticoagulant therapy 08/21/2015   Non-ischemic cardiomyopathy (HCC)    echo 08/08/10 - EF 35-45% LV and RV systolic function mod reduced   Obesity (BMI 30-39.9) 01/04/2014   OSA on CPAP 06/10/2006   Sleep study 02/2014 : severe apnea, corrected with nasal pillows and 8cmh2o     Osteoarthritis of right knee 08/28/2008   Qualifier: Diagnosis of  By: 08/30/2008 MD, Pearletha Forge     Primary localized osteoarthritis of right knee    Sleep apnea    wears CPAP nightly   Type 2 diabetes mellitus with diabetic neuropathy, with long-term current use of insulin (HCC) 06/10/2006              Ventricular tachycardia (HCC) 05/22/2015   Visit for monitoring Tikosyn therapy 08/21/2015   Vocal cord disease    Patient Active Problem List   Diagnosis Date Noted   S/P TKR (total knee replacement), right 04/28/2021   Aortic atherosclerosis (HCC) 02/19/2021   Screen for colon cancer 09/19/2020   Chronic renal impairment, stage 3a (HCC) 06/04/2020   GERD (gastroesophageal reflux disease) 03/23/2018   Chronic diastolic heart failure (HCC) 01/07/2018  Arthritis of carpometacarpal (CMC) joint of thumb 03/26/2016   Primary localized osteoarthritis of right knee    Long term current use of anticoagulant therapy 08/21/2015   Ventricular tachycardia (HCC) 05/22/2015   ICD (St. Jude Protecta dual-chamber),secondary prevention (VF arrest) January 2012 11/19/2012   Asthma, moderate persistent 12/10/2011   Diabetic neuropathy (HCC) 09/18/2011   Osteoarthritis of right knee 08/28/2008   Type 2 diabetes mellitus with diabetic neuropathy, with long-term current use of  insulin (HCC) 06/10/2006   HYPERCHOLESTEROLEMIA 06/10/2006   Essential hypertension 06/10/2006   PAF (paroxysmal atrial fibrillation) (HCC) 06/10/2006   OSA on CPAP 06/10/2006   Past Surgical History:  Procedure Laterality Date   BREAST EXCISIONAL BIOPSY Right 1999   BREAST LUMPECTOMY Right    CARDIAC DEFIBRILLATOR PLACEMENT  05/02/10   Medtronic Protecta XT-DR for CHF-VT, last download 04/12/12   CHOLECYSTECTOMY     COLONOSCOPY     HAMMER TOE SURGERY Right    ICD GENERATOR CHANGEOUT N/A 09/28/2017   Procedure: ICD GENERATOR CHANGEOUT;  Surgeon: Diane Fair, MD;  Location: [facility];  Service: Cardiovascular;  Laterality: N/A;   KNEE ARTHROSCOPY Right    PAF Ablation     By Dr Diane [name]. Now sees Dr Diane [name] at [facility]   TOTAL KNEE ARTHROPLASTY Left 09/17/2014   Procedure: TOTAL KNEE ARTHROPLASTY;  Surgeon: Diane Marvel, MD;  Location: [facility];  Service: Orthopedics;  Laterality: Left;  pt has ICD   TOTAL KNEE ARTHROPLASTY Right 04/28/2021   Procedure: TOTAL KNEE ARTHROPLASTY;  Surgeon: Diane Laura, MD;  Location: [facility] ORS;  Service: Orthopedics;  Laterality: Right;   TUBAL LIGATION     Current Outpatient Medications on File Prior to Visit  Medication Sig Dispense Refill   acetaminophen (TYLENOL 8 HOUR) 650 MG CR tablet Take 1 tablet (650 mg total) by mouth every 8 (eight) hours as needed for [name]. 90 tablet 2   apixaban (ELIQUIS) 5 MG TABS tablet TAKE 1 TABLET (5 MG) BY MOUTH TWICE DAILY 180 tablet 1   bisoprolol (ZEBETA) 10 MG tablet Take 1 tablet (10 mg total) by mouth in the morning and at bedtime. 180 tablet 1   budesonide-formoterol (SYMBICORT) 80-4.5 MCG/ACT inhaler INHALE 2 PUFFS INTO THE LUNGS IN THE MORNING AND AT BEDTIME 10.2 g 11   buPROPion (WELLBUTRIN XL) 150 MG 24 hr tablet Take 1 tablet (150 mg total) by mouth daily. 90 tablet 3   dapagliflozin propanediol (FARXIGA) 10 MG TABS tablet Take 1 tablet (10 mg total) by mouth daily. 90 tablet 3   diclofenac  Sodium (VOLTAREN) 1 % GEL Apply 2 g topically 4 (four) times daily. 100 g 1   dofetilide (TIKOSYN) 250 MCG capsule TAKE 1 CAPSULE  BY MOUTH TWICE DAILY 180 capsule 3   famotidine (PEPCID) 20 MG tablet Take 1 tablet (20 mg total) by mouth daily. 90 tablet 0   fluticasone (FLONASE) 50 MCG/ACT nasal spray instill 1 spray into each nostril twice a day 16 g 5   glucose blood test strip Use as instructed 100 each 12   insulin degludec (TRESIBA FLEXTOUCH) 100 UNIT/ML FlexTouch Pen Inject 40 Units into the skin daily. 15 mL 2   Insulin Pen Needle (BD PEN NEEDLE NANO 2ND GEN) 32G X 4 MM MISC Use as directed with insulin once daily 100 each 2   levalbuterol (XOPENEX HFA) 45 MCG/ACT inhaler INHALE 1 TO 2 PUFFS BY MOUTH INTO THE LUNGS EVERY 4 HOURS IF NEEDED FOR WHEEZING 1 Inhaler 1  levalbuterol (XOPENEX) 0.63 MG/3ML nebulizer solution One vial in nebulizer four times daily as needed 360 mL 5   montelukast (SINGULAIR) 10 MG tablet TAKE 1 TABLET BY MOUTH AT BEDTIME 90 tablet 0   potassium chloride SA (KLOR-CON M) 20 MEQ tablet Take 1 tablet by mouth daily as directed. 30 tablet 10   PRODIGY LANCETS 28G MISC 1 Units by Does not apply route 4 (four) times daily. 100 each 4   rosuvastatin (CRESTOR) 10 MG tablet TAKE 1 TABLET BY MOUTH DAILY 90 tablet 2   Semaglutide, 2 MG/DOSE, (OZEMPIC, 2 MG/DOSE,) 8 MG/3ML SOPN INJECT 2MG  UNDER THE SKIN WEEKLY 3 mL 2   torsemide (DEMADEX) 20 MG tablet Take 1 tablet (20 mg total) by mouth daily. 90 tablet 2   No current facility-administered medications on file prior to visit.    Allergies  Allergen Reactions   Avelox [Moxifloxacin Hcl In Nacl] Other (See Comments)    Cardiac arrest per pt   Nsaids Other (See Comments)    Cardiac arrest per pt    Simvastatin Other (See Comments)    Muscle [name]   Ace Inhibitors Other (See Comments) and Cough   Jardiance [Empagliflozin] Other (See Comments)    Felt "crazy", fatigue, sweating, denies hypoglycemia while taking   Latex  Rash   Social History   Occupational History   Not on file  Tobacco Use   Smoking status: Never   Smokeless tobacco: Never  Vaping Use   Vaping Use: Never used  Substance and Sexual Activity   Alcohol use: No   Drug use: No   Sexual activity: Never   Family History  Problem Relation Age of Onset   Diabetes Father    Hypertension Father    Diabetes Brother    Hypertension Brother    Lung disease Neg Hx    Cancer Neg Hx    Rheumatologic disease Neg Hx    Immunization History  Administered Date(s) Administered   Fluad Quad(high Dose 65+) 01/17/2019, 01/31/2020, 12/11/2020   H1N1 03/23/2008   Influenza Split 01/06/2011, 01/18/2012   Influenza Whole 01/09/2008, 01/21/2009, 01/14/2010   Influenza,inj,Quad PF,6+ Mos 12/21/2012, 01/04/2014, 01/14/2015, 12/06/2015, 12/04/2016, 12/08/2017   Moderna Sars-Covid-2 Vaccination 05/17/2019, 06/14/2019, 02/09/2020   Pneumococcal Conjugate-13 09/25/2013   Pneumococcal Polysaccharide-23 01/11/2001, 02/22/2012, 04/08/2017   Td 01/11/2001   Tdap 02/22/2012     Review of Systems: Negative except as noted in the HPI.   Objective: There were no vitals filed for this visit.  Diane [name] is a pleasant 71 y.o. female in NAD. AAO X 3.  Vascular Examination: CFT <3 seconds b/l. DP/PT pulses faintly palpable b/l. Skin temperature gradient warm to warm b/l. No ischemia or gangrene. No cyanosis or clubbing noted b/l. Pedal hair sparse. No [name] with calf compression b/l.   Neurological Examination: Protective sensation decreased with 10 gram monofilament b/l. Vibratory sensation diminished b/l.  Dermatological Examination: Toenails 1-5 b/l thick, discolored, elongated with subungual debris and [name] on dorsal palpation.  Pedal skin is warm and supple b/l LE. No open wounds b/l LE. No interdigital macerations noted b/l LE. Hyperkeratotic lesion(s) plantar heel pad of left foot, distal tip of left 2nd toe, distal tip of right 2nd toe, distal tip  of left 3rd toe, distal tip of right 4th toe, plantar IPJ of left great toe, plantar IPJ of right great toe, submet head 2 right foot, submet head 3 left foot, submet head 3 right foot, submet head 5 right foot, and 1st metatarsal  head b/l lower extremities.  No erythema, no edema, no drainage, no fluctuance.  Musculoskeletal Examination: Muscle strength 5/5 to b/l LE. Hammertoe deformity noted 2-5 b/l.  Radiographs: None  Footwear Assessment: Does the patient wear appropriate shoes? Yes. Does the patient need inserts/orthotics? Yes.     Latest Ref Rng & Units 08/05/2021   10:24 AM 04/15/2021    8:45 AM 02/06/2021    8:50 AM 01/02/2021    9:05 AM 12/03/2020    9:09 AM  Hemoglobin A1C  Hemoglobin-A1c 0.0 - 7.0 % 6.9  7.4  6.9  7.7  7.7    Assessment: 1. [name] due to onychomycosis of nail   2. Corns and callosities   3. Type 2 diabetes mellitus with diabetic neuropathy, with long-term current use of insulin ([place])   4. Acquired hammertoes of both feet   5. Encounter for diabetic foot exam ([place])      ADA Risk Categorization: High Risk  Patient has one or more of the following: Loss of protective sensation Absent pedal pulses Severe Foot deformity History of foot ulcer  Plan: -Patient was evaluated and treated. All patient's and/or POA's questions/concerns answered on today's visit. -Diabetic foot examination performed today. -Patient to continue soft, supportive shoe gear daily. -Toenails 1-5 b/l were debrided in length and girth with sterile nail nippers and dremel without iatrogenic bleeding.  -Corn(s) distal tip of left 2nd toe, distal tip of right 2nd toe, distal tip of left 3rd toe, and distal tip of right 4th toe and callus(es) plantar heel pad of left foot, plantar IPJ of left great toe, plantar IPJ of right great toe, submet head 2 right foot, submet head 3 b/l, submet head 5 right foot, and 1st metatarsal head b/l lower extremities were pared utilizing sterile scalpel blade  without incident. Total number debrided =13. -Patient/POA to call should there be question/concern in the interim. Return in about 9 weeks (around 12/29/2021).  Marzetta Board, DPM

## 2021-11-04 ENCOUNTER — Other Ambulatory Visit: Payer: Self-pay | Admitting: *Deleted

## 2021-11-04 ENCOUNTER — Encounter: Payer: Self-pay | Admitting: *Deleted

## 2021-11-04 DIAGNOSIS — I480 Paroxysmal atrial fibrillation: Secondary | ICD-10-CM

## 2021-11-04 DIAGNOSIS — Z1231 Encounter for screening mammogram for malignant neoplasm of breast: Secondary | ICD-10-CM

## 2021-11-04 DIAGNOSIS — I5032 Chronic diastolic (congestive) heart failure: Secondary | ICD-10-CM

## 2021-11-06 DIAGNOSIS — I480 Paroxysmal atrial fibrillation: Secondary | ICD-10-CM | POA: Diagnosis not present

## 2021-11-06 DIAGNOSIS — I5032 Chronic diastolic (congestive) heart failure: Secondary | ICD-10-CM | POA: Diagnosis not present

## 2021-11-06 LAB — BASIC METABOLIC PANEL
BUN/Creatinine Ratio: 13 (ref 12–28)
BUN: 18 mg/dL (ref 8–27)
CO2: 25 mmol/L (ref 20–29)
Calcium: 9.1 mg/dL (ref 8.7–10.3)
Chloride: 101 mmol/L (ref 96–106)
Creatinine, Ser: 1.43 mg/dL — ABNORMAL HIGH (ref 0.57–1.00)
Glucose: 272 mg/dL — ABNORMAL HIGH (ref 70–99)
Potassium: 5.4 mmol/L — ABNORMAL HIGH (ref 3.5–5.2)
Sodium: 138 mmol/L (ref 134–144)
eGFR: 39 mL/min/{1.73_m2} — ABNORMAL LOW (ref >59)

## 2021-11-06 LAB — MAGNESIUM: Magnesium: 2.5 mg/dL — ABNORMAL HIGH (ref 1.6–2.3)

## 2021-11-06 MED ORDER — POTASSIUM CHLORIDE CRYS ER 10 MEQ PO TBCR: EXTENDED_RELEASE_TABLET | ORAL | Status: DC

## 2021-11-10 ENCOUNTER — Ambulatory Visit (INDEPENDENT_AMBULATORY_CARE_PROVIDER_SITE_OTHER): Payer: Medicare HMO

## 2021-11-10 DIAGNOSIS — I5032 Chronic diastolic (congestive) heart failure: Secondary | ICD-10-CM

## 2021-11-10 DIAGNOSIS — Z95810 Presence of automatic (implantable) cardiac defibrillator: Secondary | ICD-10-CM | POA: Diagnosis not present

## 2021-11-11 ENCOUNTER — Other Ambulatory Visit: Payer: Self-pay | Admitting: Cardiovascular Disease

## 2021-11-11 ENCOUNTER — Other Ambulatory Visit (HOSPITAL_COMMUNITY): Payer: Self-pay

## 2021-11-11 ENCOUNTER — Other Ambulatory Visit: Payer: Self-pay | Admitting: Family Medicine

## 2021-11-11 MED ORDER — DOFETILIDE 250 MCG PO CAPS
ORAL_CAPSULE | ORAL | 3 refills | Status: DC
Start: 2021-11-11 — End: 2022-11-06
  Filled 2021-11-11: qty 180, 90d supply, fill #0
  Filled 2022-02-12: qty 180, 90d supply, fill #1
  Filled 2022-05-11: qty 180, 90d supply, fill #2
  Filled 2022-08-11: qty 180, 90d supply, fill #3

## 2021-11-11 MED ORDER — FAMOTIDINE 20 MG PO TABS
20.0000 mg | ORAL_TABLET | Freq: Every day | ORAL | 0 refills | Status: DC
  Filled 2021-11-11: qty 90, 90d supply, fill #0

## 2021-11-11 MED ORDER — TORSEMIDE 20 MG PO TABS
20.0000 mg | ORAL_TABLET | Freq: Every day | ORAL | 2 refills | Status: DC
  Filled 2021-11-11: qty 90, 90d supply, fill #0
  Filled 2022-02-09: qty 90, 90d supply, fill #1
  Filled 2022-05-06: qty 90, 90d supply, fill #2

## 2021-11-12 ENCOUNTER — Other Ambulatory Visit (HOSPITAL_COMMUNITY): Payer: Self-pay

## 2021-11-13 ENCOUNTER — Other Ambulatory Visit (HOSPITAL_COMMUNITY): Payer: Self-pay

## 2021-11-13 NOTE — Progress Notes (Signed)
EPIC Encounter for ICM Monitoring  Patient Name: [name] is a 71 y.o. female Date: 11/13/2021 Primary Care Physican: Maury Dus, MD Primary Cardiologist: Croitoru Electrophysiologist: Croitoru 06/24/2021 Weight: 172-175 lbs 07/30/2021 Weight: 174 lbs 09/04/2021 Weight: 180 lbs 11/13/2021 Weight: 179 lbs   Time in AT/AF 0.0 hr/day (0.0%)                                  Spoke with patient and heart failure questions reviewed.  Pt asymptomatic for fluid accumulation.  Reports feeling well at this time and voices no complaints.    Optivol thoracic impedance suggesting normal fluid levels.   Prescribed dosage:  Torsemide 20 mg 1 tablet by mouth daily.   Potassium 10 mEq 1 tablet by mouth by mouth daily.   Labs: 11/06/2021 Creatinine 1.43, BUN 18, Potassium 5.4, Sodium 138, GFR 39 04/29/2021 Creatinine 1.14, BUN 22, Potassium 4.0, Sodium 133, GFR 52 04/15/2021 Creatinine 1.26, BUN 21, Potassium 4.6, Sodium 138, GFR 46 A complete set of results can be found in Results Review.   Recommendations:  No changes and encouraged to call if experiencing any fluid symptoms.   Follow-up plan: ICM clinic phone appointment on 12/16/2021.   91 day device clinic remote transmission 02/18/2022.     Next office visit scheduled:  Recall 10/15/2022 for Dr Royann Shivers.     Copy of ICM check sent to Dr. Royann Shivers.    3 month ICM trend: 11/10/2021.    12-14 Month ICM trend:     [name], RN 11/13/2021 1:30 PM

## 2021-11-14 DIAGNOSIS — E119 Type 2 diabetes mellitus without complications: Secondary | ICD-10-CM | POA: Diagnosis not present

## 2021-11-18 ENCOUNTER — Ambulatory Visit (INDEPENDENT_AMBULATORY_CARE_PROVIDER_SITE_OTHER): Payer: Medicare HMO

## 2021-11-18 ENCOUNTER — Other Ambulatory Visit (HOSPITAL_COMMUNITY): Payer: Self-pay

## 2021-11-18 VITALS — Ht 66.0 in | Wt 179.0 lb

## 2021-11-18 DIAGNOSIS — Z0000 Encounter for general adult medical examination without abnormal findings: Secondary | ICD-10-CM

## 2021-11-18 NOTE — Progress Notes (Unsigned)
Subjective:   [name] is a 71 y.o. female who presents for Medicare Annual (Subsequent) preventive examination.  Patient consented to have virtual visit and was identified by name and date of birth. Method of visit: {TELEPHONE VS RJJOA:41660}  Encounter participants: Patient: [name] - located at *** Nurse/Provider: Steva Colder - located at *** Others (if applicable): ***    Review of Systems:  *** Cardiac Risk Factors include: advanced age (>64men, >23 women);diabetes mellitus;dyslipidemia;family history of premature cardiovascular disease     Objective:     Vitals: Ht 5\' 6"  (1.676 m)   Wt 179 lb (81.2 kg)   LMP 07/26/2011   BMI 28.89 kg/m   Body mass index is 28.89 kg/m.     11/18/2021    1:48 PM 04/15/2021    9:38 AM 09/19/2020    8:42 AM 06/25/2020   10:12 AM 06/04/2020    1:35 PM 05/01/2020    9:10 AM 03/06/2020    9:29 AM  Advanced Directives  Does Patient Have a Medical Advance Directive? Yes Yes No No No No No  Type of 03/08/2020 of [place];Living will Living will       Does patient want to make changes to medical advance directive? No - Patient declined        Copy of Healthcare Power of Attorney in Chart? Yes - validated most recent copy scanned in chart (See row information)        Would patient like information on creating a medical advance directive?   No - Patient declined No - Patient declined No - Patient declined No - Patient declined No - Patient declined    Tobacco Social History   Tobacco Use  Smoking Status Never   Passive exposure: Never  Smokeless Tobacco Never     Counseling given: Not Answered   Clinical Intake:  Pre-visit preparation completed: Yes  [name] Score: 0-No [name]     Diabetes: Yes  How often do you need to have someone help you when you read instructions, pamphlets, or other written materials from your doctor or pharmacy?: 3 - Sometimes What is the last grade level you completed in  school?: ~10th grade  Interpreter Needed?: No     Past Medical History:  Diagnosis Date   AICD (automatic cardioverter/defibrillator) present    Anemia    Anxiety    Arthritis    Arthritis of carpometacarpal (CMC) joint of thumb 03/26/2016   Asthma    has had multiple hospitalizations for this   Asthma, moderate persistent 12/10/2011   Atrial fibrillation (HCC)    ablation x 2 [facility], 01/2006, 2011.  on warfarin   Cardiac arrest (HCC) 04/2010   in hospital for pneumonia when this occured- occured at the hospital   CHF (congestive heart failure) (HCC) 2012   Echo 08/08/10 by [facility]. EF 35-45%. LV systolic function moderately reduced. Moderate global hypokinesis of LV.  RV systolic function moderately reduced. Mild MR. Trace AR.   Chronic diastolic heart failure (HCC) 01/07/2018   CKD (chronic kidney disease) stage 2, GFR 60-89 ml/min 02/22/2012   Her cr range 1.2-1.5 since Jan 2012 after hospitalization. 10/13 Estimated Creatinine Clearance: 69.3 ml/min (by C-G formula based on Cr of 1).     Complication of anesthesia    difficult time waking up after anesthesia   Depression    Diabetes mellitus    Type 2   Diabetic neuropathy (HCC) 09/18/2011   GASTROESOPHAGEAL REFLUX,  NO ESOPHAGITIS 06/10/2006   Qualifier: Diagnosis of  By: Eusebio Friendly     GERD (gastroesophageal reflux disease) 03/23/2018   History of hiatal hernia    History of kidney stones    HYPERCHOLESTEROLEMIA 06/10/2006   LDL 93 at 01/2011 check-  Continue pravastatin 20mg  po daily.  --discuss health modifications and possibly increaseing dose at next appt.  Cardiology- Dr little- stopped pravachol on 05/21/11- 2/2 allergies?     Hyperlipidemia    Hypertension    ICD (St. Jude Protecta dual-chamber),secondary prevention (VF arrest) January 2012 11/19/2012   Iliotibial band syndrome, left 11/01/2018   [name] 06/04/2008   LLE, Baker's cyst in popliteal fossa, no DVT   Long term current use of  anticoagulant therapy 08/21/2015   Non-ischemic cardiomyopathy ([place])    echo 08/08/10 - EF [id number] LV and RV systolic function mod reduced   Obesity (BMI 30-39.9) 01/04/2014   OSA on CPAP 06/10/2006   Sleep study 02/2014 : severe apnea, corrected with nasal pillows and 8cmh2o     Osteoarthritis of right knee 08/28/2008   Qualifier: Diagnosis of  By: Barbaraann Barthel MD, Audelia Acton     Primary localized osteoarthritis of right knee    Sleep apnea    wears CPAP nightly   Type 2 diabetes mellitus with diabetic neuropathy, with long-term current use of insulin ([place]) 06/10/2006              Ventricular tachycardia ([place]) 05/22/2015   Visit for monitoring Tikosyn therapy 08/21/2015   Vocal cord disease    Past Surgical History:  Procedure Laterality Date   BREAST EXCISIONAL BIOPSY Right 1999   BREAST LUMPECTOMY Right    CARDIAC DEFIBRILLATOR PLACEMENT  05/02/10   Medtronic Protecta XT-DR for CHF-VT, last download 04/12/12   CHOLECYSTECTOMY     COLONOSCOPY     HAMMER TOE SURGERY Right    ICD GENERATOR CHANGEOUT N/A 09/28/2017   Procedure: [place];  Surgeon: Sanda Klein, MD;  Location: [facility];  Service: Cardiovascular;  Laterality: N/A;   KNEE ARTHROSCOPY Right    PAF Ablation     By Dr Ola Spurr. Now sees Dr Tilden Dome at [facility] Left 09/17/2014   Procedure: TOTAL KNEE ARTHROPLASTY;  Surgeon: Elsie Saas, MD;  Location: [facility];  Service: Orthopedics;  Laterality: Left;  pt has ICD   TOTAL KNEE ARTHROPLASTY Right 04/28/2021   Procedure: TOTAL KNEE ARTHROPLASTY;  Surgeon: Willaim Sheng, MD;  Location: [facility];  Service: Orthopedics;  Laterality: Right;   TUBAL LIGATION     Family History  Problem Relation Age of Onset   Diabetes Father    Hypertension Father    Diabetes Brother    Hypertension Brother    Lung disease Neg Hx    Cancer Neg Hx    Rheumatologic disease Neg Hx    Social History   Socioeconomic History   Marital status:  Widowed    Spouse name: Not on file   Number of children: 3   Years of education: 10   Highest education level: 10th grade  Occupational History   Occupation: Retired  Tobacco Use   Smoking status: Never    Passive exposure: Never   Smokeless tobacco: Never  Vaping Use   Vaping Use: Never used  Substance and Sexual Activity   Alcohol use: No   Drug use: No   Sexual activity: Not Currently  Other Topics Concern   Not on file  Social History Narrative  Not employed   Exercise- walking 1 hour daily.    Diet- eating healthy diet.       [facility]:   She is originally from [facility] [place] [facility]. Has always lived in [place]. 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 Currently has a small dog. No bird or mold exposure.       Current Social History 04/08/2017        Patient lives with daughter, Lafonda Mosses, in two level home 04/08/2017   Transportation: Patient has own vehicle and drives herself 04/08/2017   Important Relationships Daughter, Lafonda Mosses 04/08/2017    Pets: Shiatzu Evaristo Bury)  04/08/2017   Education / Work:  10 th grade/ None 04/08/2017   Interests / Fun: Read, do puzzles 04/08/2017   Current Stressors: None because she prays to God 04/08/2017   Religious / Personal Beliefs: Non-Denominational 04/08/2017   L. Leward Quan, RN, BSN                                                                                                 Social Determinants of Health   Financial Resource Strain: Low Risk  (11/18/2021)   Overall Financial Resource Strain (CARDIA)    Difficulty of Paying Living Expenses: Not hard at all  Food Insecurity: No Food Insecurity (11/18/2021)   Hunger Vital Sign    Worried About Running Out of Food in the Last Year: Never true    Ran Out of Food in the Last Year: Never true  Transportation Needs: No Transportation Needs (11/18/2021)   PRAPARE - Scientist, research (physical sciences) (Medical): No    Lack of Transportation (Non-Medical): No  Physical Activity: Insufficiently Active (11/18/2021)   Exercise Vital Sign    Days of Exercise per Week: 2 days    Minutes of Exercise per Session: 50 min  Stress: No Stress Concern Present (11/18/2021)   [employer] of Occupational Health - Occupational Stress Questionnaire    Feeling of Stress : Not at all  Social Connections: Moderately Isolated (11/18/2021)   Social Connection and Isolation Panel [NHANES]    Frequency of Communication with Friends and Family: More than three times a week    Frequency of Social Gatherings with Friends and Family: More than three times a week    Attends Religious Services: More than 4 times per year    Active Member of [employer] or Organizations: No    Attends Banker Meetings: Never    Marital Status: Widowed    Outpatient Encounter Medications as of 11/18/2021  Medication Sig   acetaminophen (TYLENOL 8 HOUR) 650 MG CR tablet Take 1 tablet (650 mg total) by mouth every 8 (eight) hours as needed for [name].   apixaban (ELIQUIS) 5 MG TABS tablet TAKE 1 TABLET (5 MG) BY MOUTH TWICE DAILY   bisoprolol (ZEBETA) 10 MG tablet Take 1 tablet (10 mg total) by mouth in the morning and at bedtime.   budesonide-formoterol (SYMBICORT) 80-4.5 MCG/ACT inhaler INHALE 2 PUFFS INTO THE LUNGS IN THE MORNING  AND AT BEDTIME   buPROPion (WELLBUTRIN XL) 150 MG 24 hr tablet Take 1 tablet (150 mg total) by mouth daily.   dapagliflozin propanediol (FARXIGA) 10 MG TABS tablet Take 1 tablet (10 mg total) by mouth daily.   diclofenac Sodium (VOLTAREN) 1 % GEL Apply 2 g topically 4 (four) times daily.   dofetilide (TIKOSYN) 250 MCG capsule TAKE 1 CAPSULE  BY MOUTH TWICE DAILY   famotidine (PEPCID) 20 MG tablet Take 1 tablet (20 mg total) by mouth daily.   fluticasone (FLONASE) 50 MCG/ACT nasal spray instill 1 spray into each nostril twice a day   glucose blood test strip Use as instructed   insulin  degludec (TRESIBA FLEXTOUCH) 100 UNIT/ML FlexTouch Pen Inject 40 Units into the skin daily.   Insulin Pen Needle (BD PEN NEEDLE NANO 2ND GEN) 32G X 4 MM MISC Use as directed with insulin once daily   levalbuterol (XOPENEX HFA) 45 MCG/ACT inhaler INHALE 1 TO 2 PUFFS BY MOUTH INTO THE LUNGS EVERY 4 HOURS IF NEEDED FOR WHEEZING   levalbuterol (XOPENEX) 0.63 MG/3ML nebulizer solution One vial in nebulizer four times daily as needed   montelukast (SINGULAIR) 10 MG tablet TAKE 1 TABLET BY MOUTH AT BEDTIME   potassium chloride SA (KLOR-CON M) 10 MEQ tablet Take 1 tablet by mouth daily as directed.   PRODIGY LANCETS 28G MISC 1 Units by Does not apply route 4 (four) times daily.   rosuvastatin (CRESTOR) 10 MG tablet TAKE 1 TABLET BY MOUTH DAILY   Semaglutide, 2 MG/DOSE, (OZEMPIC, 2 MG/DOSE,) 8 MG/3ML SOPN INJECT 2MG  UNDER THE SKIN WEEKLY   torsemide (DEMADEX) 20 MG tablet Take 1 tablet (20 mg total) by mouth daily.   No facility-administered encounter medications on file as of 11/18/2021.    Activities of Daily Living    11/18/2021    1:49 PM 04/15/2021    9:40 AM  In your present state of health, do you have any difficulty performing the following activities:  Hearing? 0   Vision? 0   Difficulty concentrating or making decisions? 0   Walking or climbing stairs? 1   Comment recent knee surgery   Dressing or bathing? 0   Doing errands, shopping? 0 0  Preparing Food and eating ? N   Using the Toilet? N   In the past six months, have you accidently leaked urine? N   Do you have problems with loss of bowel control? N   Managing your Medications? N   Managing your Finances? N   Housekeeping or managing your Housekeeping? N     Patient Care Team: Alcus Dad, MD as PCP - General (Family Medicine) Sanda Klein, MD as PCP - Cardiology (Cardiology) Elsie Stain, MD as Attending Physician (Pulmonary Disease) Gean Birchwood, DPM (Inactive) as Consulting Physician (Podiatry) Desma Maxim, MD as Referring Physician (Ophthalmology) Sanda Klein, MD as Consulting Physician (Cardiology)    Assessment:   This is a routine wellness examination for [facility].  Exercise Activities and Dietary recommendations Current Exercise Habits: Home exercise routine, Time (Minutes): 45, Frequency (Times/Week): 2, Weekly Exercise (Minutes/Week): 90, Intensity: Mild, Exercise limited by: cardiac condition(s);respiratory conditions(s);orthopedic condition(s)   Goals       HEMOGLOBIN A1C < 8 (pt-stated)        Fall Risk    11/18/2021    1:48 PM 06/25/2020   10:12 AM 06/04/2020    1:35 PM 03/06/2020    9:29 AM 01/31/2020    8:40 AM  Fall Risk  Falls in the past year? 0 0 0 0 0  Number falls in past yr: 0 0 0 0 0  Injury with Fall? 0 0 0 0 0  Risk for fall due to : No Fall Risks      Follow up Falls prevention discussed       Is the patient's home free of loose throw rugs in walkways, pet beds, electrical cords, etc?   {Blank single:19197::"yes","no"}      Grab bars in the bathroom? {Blank single:19197::"yes","no"}      Handrails on the stairs?   {Blank single:19197::"yes","no"}      Adequate lighting?   {Blank single:19197::"yes","no"}  Patient rating of health (0-10) scale: 6   Depression Screen    11/18/2021    1:45 PM 08/05/2021   10:18 AM 09/19/2020    8:42 AM 08/30/2020    8:51 AM  PHQ 2/9 Scores  PHQ - 2 Score 0 0 0 0  PHQ- 9 Score  0 0 0     Cognitive Function        11/18/2021    1:51 PM  6CIT Screen  What Year? 0 points  What month? 0 points  What time? 0 points  Count back from 20 0 points  Months in reverse 0 points  Repeat phrase 0 points  Total Score 0 points    Immunization History  Administered Date(s) Administered   Fluad Quad(high Dose 65+) 01/17/2019, 01/31/2020, 12/11/2020   H1N1 03/23/2008   Influenza Split 01/06/2011, 01/18/2012   Influenza Whole 01/09/2008, 01/21/2009, 01/14/2010   Influenza,inj,Quad PF,6+ Mos 12/21/2012, 01/04/2014,  01/14/2015, 12/06/2015, 12/04/2016, 12/08/2017   Moderna Sars-Covid-2 Vaccination 05/17/2019, 06/14/2019, 02/09/2020   Pneumococcal Conjugate-13 09/25/2013   Pneumococcal Polysaccharide-23 01/11/2001, 02/22/2012, 04/08/2017   Td 01/11/2001   Tdap 02/22/2012     Screening Tests Health Maintenance  Topic Date Due   URINE MICROALBUMIN  Never done   Zoster Vaccines- Shingrix (1 of 2) Never done   OPHTHALMOLOGY EXAM  03/11/2018   COVID-19 Vaccine (4 - Booster for Moderna series) 04/05/2020   COLONOSCOPY (Pts 45-61yrs Insurance coverage will need to be confirmed)  09/16/2020   INFLUENZA VACCINE  11/11/2021   HEMOGLOBIN A1C  02/04/2022   TETANUS/TDAP  02/21/2022   FOOT EXAM  10/28/2022   MAMMOGRAM  11/20/2022   Pneumonia Vaccine 41+ Years old  Completed   DEXA SCAN  Completed   Hepatitis C Screening  Completed   HPV VACCINES  Aged Out    Cancer Screenings: Lung: Low Dose CT Chest recommended if Age 96-80 years, 20 pack-year currently smoking OR have quit w/in 15years. Patient {DOES NOT does:27190::"does not"} qualify. Breast:  Up to date on Mammogram? {Yes/No:30480221}   Up to date of Bone Density/Dexa? {Yes/No:30480221} Colorectal: ***  Additional Screenings: ***: Hepatitis C Screening:      Plan:   ***   I have personally reviewed and noted the following in the patient's chart:   Medical and social history Use of alcohol, tobacco or illicit drugs  Current medications and supplements Functional ability and status Nutritional status Physical activity Advanced directives List of other physicians Hospitalizations, surgeries, and ER visits in previous 12 months Vitals Screenings to include cognitive, depression, and falls Referrals and appointments  In addition, I have reviewed and discussed with patient certain preventive protocols, quality metrics, and best practice recommendations. A written personalized care plan for preventive services as well as general preventive  health recommendations were provided to patient.    This visit was conducted  virtually in the setting of the [place] pandemic.    Dorna Bloom, [place]  11/18/2021

## 2021-11-19 ENCOUNTER — Ambulatory Visit (INDEPENDENT_AMBULATORY_CARE_PROVIDER_SITE_OTHER): Payer: Medicare HMO

## 2021-11-19 DIAGNOSIS — I472 Ventricular tachycardia, unspecified: Secondary | ICD-10-CM | POA: Diagnosis not present

## 2021-11-19 LAB — CUP PACEART REMOTE DEVICE CHECK
Battery Remaining Longevity: 39 mo
Battery Voltage: 2.97 V
Brady Statistic AP VP Percent: 1.49 %
Brady Statistic AP VS Percent: 94.63 %
Brady Statistic AS VP Percent: 0.05 %
Brady Statistic AS VS Percent: 3.83 %
Brady Statistic RA Percent Paced: 92.18 %
Brady Statistic RV Percent Paced: 1.63 %
Date Time Interrogation Session: 20230809012202
HighPow Impedance: 72 Ohm
Implantable Lead Implant Date: 20120120
Implantable Lead Implant Date: 20120120
Implantable Lead Location: 753859
Implantable Lead Location: 753860
Implantable Lead Model: 5076
Implantable Lead Model: 6935
Implantable Pulse Generator Implant Date: 20190618
Lead Channel Impedance Value: 342 Ohm
Lead Channel Impedance Value: 437 Ohm
Lead Channel Impedance Value: 437 Ohm
Lead Channel Pacing Threshold Amplitude: 0.5 V
Lead Channel Pacing Threshold Amplitude: 0.75 V
Lead Channel Pacing Threshold Pulse Width: 0.4 ms
Lead Channel Pacing Threshold Pulse Width: 0.4 ms
Lead Channel Sensing Intrinsic Amplitude: 0.25 mV
Lead Channel Sensing Intrinsic Amplitude: 0.25 mV
Lead Channel Sensing Intrinsic Amplitude: 10.625 mV
Lead Channel Sensing Intrinsic Amplitude: 10.625 mV
Lead Channel Setting Pacing Amplitude: 2 V
Lead Channel Setting Pacing Amplitude: 2.5 V
Lead Channel Setting Pacing Pulse Width: 0.4 ms
Lead Channel Setting Sensing Sensitivity: 0.3 mV

## 2021-11-19 NOTE — Patient Instructions (Signed)
[name] Thank you for taking time to come for your Medicare Wellness Visit. I appreciate your ongoing commitment to your health goals. Please review the following plan we discussed and let me know if I can assist you in the future.    These are the goals we discussed:   Goals      HEMOGLOBIN A1C < 7     Last A1C- 6.20 July 2021       You spoke to Dorna Bloom, [place] over the phone for your annual wellness visit.  We discussed goals:   Goals      HEMOGLOBIN A1C < 7     Last A1C- 6.20 July 2021        We also discussed recommended health maintenance. As discussed, you are due for: Health Maintenance  Topic Date Due   URINE MICROALBUMIN  Never done   OPHTHALMOLOGY EXAM  03/11/2018   COVID-19 Vaccine (4 - Booster for Moderna series) 04/05/2020   INFLUENZA VACCINE  11/11/2021   HEMOGLOBIN A1C  02/04/2022   TETANUS/TDAP  02/21/2022   FOOT EXAM  10/28/2022   MAMMOGRAM  11/20/2022   COLONOSCOPY (Pts 45-42yrs Insurance coverage will need to be confirmed)  10/29/2030   Pneumonia Vaccine 32+ Years old  Completed   DEXA SCAN  Completed   Hepatitis C Screening  Completed   Zoster Vaccines- Shingrix  Completed   HPV VACCINES  Aged Out   Mammogram apt scheduled for 11/24/2021 PCP apt scheduled for 01/01/2022 [facility] should have flu vaccines next month  Preventive Care 65 Years and Older, Female Preventive care refers to lifestyle choices and visits with your health care provider that can promote health and wellness. Preventive care visits are also called wellness exams. What can I expect for my preventive care visit? Counseling Your health care provider may ask you questions about your: Medical history, including: Past medical problems. Family medical history. Pregnancy and menstrual history. History of falls. Current health, including: Memory and ability to understand (cognition). Emotional well-being. Home life and relationship well-being. Sexual activity and sexual  health. Lifestyle, including: Alcohol, nicotine or tobacco, and drug use. Access to firearms. Diet, exercise, and sleep habits. Work and work Statistician. Sunscreen use. Safety issues such as seatbelt and bike helmet use. Physical exam Your health care provider will check your: Height and weight. These may be used to calculate your BMI (body mass index). BMI is a measurement that tells if you are at a healthy weight. Waist circumference. This measures the distance around your waistline. This measurement also tells if you are at a healthy weight and may help predict your risk of certain diseases, such as type 2 diabetes and high blood pressure. Heart rate and blood pressure. Body temperature. Skin for abnormal spots. What immunizations do I need?  Vaccines are usually given at various ages, according to a schedule. Your health care provider will recommend vaccines for you based on your age, medical history, and lifestyle or other factors, such as travel or where you work. What tests do I need? Screening Your health care provider may recommend screening tests for certain conditions. This may include: Lipid and cholesterol levels. Hepatitis C test. Hepatitis B test. HIV (human immunodeficiency virus) test. STI (sexually transmitted infection) testing, if you are at risk. Lung cancer screening. Colorectal cancer screening. Diabetes screening. This is done by checking your blood sugar (glucose) after you have not eaten for a while (fasting). Mammogram. Talk with your health care provider about how often you should  have regular mammograms. BRCA-related cancer screening. This may be done if you have a family history of breast, ovarian, tubal, or peritoneal cancers. Bone density scan. This is done to screen for osteoporosis. Talk with your health care provider about your test results, treatment options, and if necessary, the need for more tests. Follow these instructions at home: Eating and  drinking  Eat a diet that includes fresh fruits and vegetables, whole grains, lean protein, and low-fat dairy products. Limit your intake of foods with high amounts of sugar, saturated fats, and salt. Take vitamin and mineral supplements as recommended by your health care provider. Do not drink alcohol if your health care provider tells you not to drink. If you drink alcohol: Limit how much you have to 0-1 drink a day. Know how much alcohol is in your drink. In the [place] one drink equals one 12 oz bottle of beer (355 mL), one 5 oz glass of wine (148 mL), or one 1 oz glass of hard liquor (44 mL). Lifestyle Brush your teeth every morning and night with fluoride toothpaste. Floss one time each day. Exercise for at least 30 minutes 5 or more days each week. Do not use any products that contain nicotine or tobacco. These products include cigarettes, chewing tobacco, and vaping devices, such as e-cigarettes. If you need help quitting, ask your health care provider. Do not use drugs. If you are sexually active, practice safe sex. Use a condom or other form of protection in order to prevent STIs. Take aspirin only as told by your health care provider. Make sure that you understand how much to take and what form to take. Work with your health care provider to find out whether it is safe and beneficial for you to take aspirin daily. Ask your health care provider if you need to take a cholesterol-lowering medicine (statin). Find healthy ways to manage stress, such as: Meditation, yoga, or listening to music. Journaling. Talking to a trusted person. Spending time with friends and family. Minimize exposure to UV radiation to reduce your risk of skin cancer. Safety Always wear your seat belt while driving or riding in a vehicle. Do not drive: If you have been drinking alcohol. Do not ride with someone who has been drinking. When you are tired or distracted. While texting. If you have been using any  mind-altering substances or drugs. Wear a helmet and other protective equipment during sports activities. If you have firearms in your house, make sure you follow all gun safety procedures. What's next? Visit your health care provider once a year for an annual wellness visit. Ask your health care provider how often you should have your eyes and teeth checked. Stay up to date on all vaccines. This information is not intended to replace advice given to you by your health care provider. Make sure you discuss any questions you have with your health care provider. Document Revised: 09/25/2020 Document Reviewed: 09/25/2020 Elsevier Patient Education  [place] [facility]'s number is [phone]. Please call with questions or concerns about what we discussed today.

## 2021-11-22 ENCOUNTER — Other Ambulatory Visit: Payer: Self-pay | Admitting: Cardiovascular Disease

## 2021-11-22 ENCOUNTER — Other Ambulatory Visit (HOSPITAL_COMMUNITY): Payer: Self-pay

## 2021-11-24 ENCOUNTER — Ambulatory Visit
Admission: RE | Admit: 2021-11-24 | Discharge: 2021-11-24 | Disposition: A | Discharge status: Home / Self Care | Payer: Medicare HMO | Source: Ambulatory Visit | Attending: Podiatry | Admitting: Podiatry

## 2021-11-24 ENCOUNTER — Other Ambulatory Visit (HOSPITAL_COMMUNITY): Payer: Self-pay

## 2021-11-24 DIAGNOSIS — Z1231 Encounter for screening mammogram for malignant neoplasm of breast: Secondary | ICD-10-CM | POA: Diagnosis not present

## 2021-11-25 ENCOUNTER — Ambulatory Visit: Payer: Self-pay | Admitting: Licensed Clinical Social Worker

## 2021-11-25 NOTE — Patient Outreach (Signed)
  Care Coordination   Initial Visit Note   11/25/2021 Name: [name] MRN: [id number] DOB: [birth year]  [name] is a 71 y.o. year old female who sees Maury Dus, MD for primary care. I spoke with  [name] by phone today  What matters to the patients health and wellness today?  Patient has no needs on today.    Goals Addressed               This Visit's Progress     MSW Care Coordination (pt-stated)        Patient advise she is doing well and SW assistance isn't needed at this time.  Patient understands to contact PCP if additional services is needed.        SDOH assessments and interventions completed:  Yes     Care Coordination Interventions Activated:  Yes  Care Coordination Interventions:  Yes, provided   Follow up plan: No further intervention required.   Encounter Outcome:  Pt. Visit Completed   [name] , MSW Social Worker [facility]  [phone]

## 2021-11-25 NOTE — Patient Instructions (Signed)
Visit Information  Instructions:   Patient was given the following information about care management and care coordination services today, agreed to services, and gave verbal consent: 1.care management/care coordination services include personalized support from designated clinical staff supervised by their physician, including individualized plan of care and coordination with other care providers 2. 24/7 contact phone numbers for assistance for urgent and routine care needs. 3. The patient may stop care management/care coordination services at any time by phone call to the office staff.  Patient verbalizes understanding of instructions and care plan provided today and agrees to view in MyChart. Active MyChart status and patient understanding of how to access instructions and care plan via MyChart confirmed with patient.     No further follow up required: .  Durenda Pechacek, BSW , MSW Social Worker [facility]  [phone]      

## 2021-11-26 ENCOUNTER — Other Ambulatory Visit (HOSPITAL_COMMUNITY): Payer: Self-pay

## 2021-11-27 ENCOUNTER — Other Ambulatory Visit: Payer: Self-pay

## 2021-11-27 DIAGNOSIS — I480 Paroxysmal atrial fibrillation: Secondary | ICD-10-CM | POA: Diagnosis not present

## 2021-11-27 DIAGNOSIS — I5032 Chronic diastolic (congestive) heart failure: Secondary | ICD-10-CM | POA: Diagnosis not present

## 2021-11-27 LAB — BASIC METABOLIC PANEL
BUN/Creatinine Ratio: 14 (ref 12–28)
BUN: 19 mg/dL (ref 8–27)
CO2: 25 mmol/L (ref 20–29)
Calcium: 9.1 mg/dL (ref 8.7–10.3)
Chloride: 100 mmol/L (ref 96–106)
Creatinine, Ser: 1.34 mg/dL — ABNORMAL HIGH (ref 0.57–1.00)
Glucose: 205 mg/dL — ABNORMAL HIGH (ref 70–99)
Potassium: 4.8 mmol/L (ref 3.5–5.2)
Sodium: 141 mmol/L (ref 134–144)
eGFR: 43 mL/min/{1.73_m2} — ABNORMAL LOW (ref >59)

## 2021-11-27 LAB — MAGNESIUM: Magnesium: 2.4 mg/dL — ABNORMAL HIGH (ref 1.6–2.3)

## 2021-12-12 ENCOUNTER — Other Ambulatory Visit (HOSPITAL_COMMUNITY): Payer: Self-pay

## 2021-12-16 ENCOUNTER — Other Ambulatory Visit (HOSPITAL_COMMUNITY): Payer: Self-pay

## 2021-12-16 ENCOUNTER — Other Ambulatory Visit: Payer: Self-pay | Admitting: Cardiovascular Disease

## 2021-12-16 ENCOUNTER — Ambulatory Visit (INDEPENDENT_AMBULATORY_CARE_PROVIDER_SITE_OTHER): Payer: Medicare HMO

## 2021-12-16 DIAGNOSIS — I5032 Chronic diastolic (congestive) heart failure: Secondary | ICD-10-CM

## 2021-12-16 DIAGNOSIS — Z95810 Presence of automatic (implantable) cardiac defibrillator: Secondary | ICD-10-CM | POA: Diagnosis not present

## 2021-12-17 ENCOUNTER — Telehealth: Payer: Self-pay | Admitting: Cardiovascular Disease

## 2021-12-17 NOTE — Telephone Encounter (Signed)
*  STAT* If patient is at the pharmacy, call can be transferred to refill team.   1. Which medications need to be refilled? (please list name of each medication and dose if known) potassium chloride SA (KLOR-CON M) 10 MEQ tablet  2. Which pharmacy/location (including street and city if local pharmacy) is medication to be sent to? [facility]  3. Do they need a 30 day or 90 day supply? 90

## 2021-12-18 ENCOUNTER — Other Ambulatory Visit (HOSPITAL_COMMUNITY): Payer: Self-pay

## 2021-12-18 MED ORDER — APIXABAN 5 MG PO TABS
5.0000 mg | ORAL_TABLET | Freq: Two times a day (BID) | ORAL | 1 refills | Status: DC
  Filled 2021-12-18: qty 180, 90d supply, fill #0
  Filled 2022-03-13: qty 180, 90d supply, fill #1

## 2021-12-18 MED ORDER — POTASSIUM CHLORIDE CRYS ER 10 MEQ PO TBCR: EXTENDED_RELEASE_TABLET | ORAL | Status: DC

## 2021-12-18 MED ORDER — ROSUVASTATIN CALCIUM 10 MG PO TABS
ORAL_TABLET | ORAL | 2 refills | Status: DC
  Filled 2021-12-18: qty 90, 90d supply, fill #0
  Filled 2022-03-13: qty 90, 90d supply, fill #1
  Filled 2022-06-08: qty 90, 90d supply, fill #2

## 2021-12-18 NOTE — Telephone Encounter (Signed)
Prescription refill request for Eliquis received. Indication:Afib Last office visit:7/23 Scr:1.3 Age: 71 Weight:81.2 kg  Prescription refilled

## 2021-12-19 ENCOUNTER — Other Ambulatory Visit (HOSPITAL_COMMUNITY): Payer: Self-pay

## 2021-12-19 ENCOUNTER — Telehealth: Payer: Self-pay

## 2021-12-19 NOTE — Progress Notes (Signed)
EPIC Encounter for ICM Monitoring  Patient Name: [name] is a 71 y.o. female Date: 12/19/2021 Primary Care Physican: Maury Dus, MD Primary Cardiologist: Croitoru Electrophysiologist: Croitoru 06/24/2021 Weight: 172-175 lbs 07/30/2021 Weight: 174 lbs 09/04/2021 Weight: 180 lbs 11/13/2021 Weight: 179 lbs   Since 19-Nov-2021 AT/AF 67 Time in AT/AF 0.4 hr/day (1.7%) Longest AT/AF 50 minutes                                  Attempted call to patient and unable to reach.  Left detailed message per DPR regarding transmission. Transmission reviewed.     Optivol thoracic impedance suggesting normal fluid levels.   Prescribed dosage:  Torsemide 20 mg 1 tablet by mouth daily.   Potassium 10 mEq 1 tablet by mouth by mouth daily.   Labs: 11/27/2021 Creatinine 1.34, BUN 19, Potassium 4.8, Sodium 141, GFR 43 11/06/2021 Creatinine 1.43, BUN 18, Potassium 5.4, Sodium 138, GFR 39 04/29/2021 Creatinine 1.14, BUN 22, Potassium 4.0, Sodium 133, GFR 52 04/15/2021 Creatinine 1.26, BUN 21, Potassium 4.6, Sodium 138, GFR 46 A complete set of results can be found in Results Review.   Recommendations:  Left voice mail with ICM number and encouraged to call if experiencing any fluid symptoms..   Follow-up plan: ICM clinic phone appointment on 01/26/2022.   91 day device clinic remote transmission 02/18/2022.     Next office visit scheduled:  Recall 10/15/2022 for Dr Royann Shivers.     Copy of ICM check sent to Dr. Royann Shivers.     3 month ICM trend: 12/16/2021.    12-14 Month ICM trend:     [name], RN 12/19/2021 10:38 AM

## 2021-12-19 NOTE — Telephone Encounter (Signed)
Remote ICM transmission received.  Attempted call to patient regarding ICM remote transmission and left detailed message per DPR.  Advised to return call for any fluid symptoms or questions. Next ICM remote transmission scheduled 01/26/2022.    

## 2021-12-22 ENCOUNTER — Other Ambulatory Visit: Payer: Self-pay | Admitting: Cardiovascular Disease

## 2021-12-22 ENCOUNTER — Other Ambulatory Visit (HOSPITAL_COMMUNITY): Payer: Self-pay

## 2021-12-22 ENCOUNTER — Other Ambulatory Visit: Payer: Self-pay | Admitting: Pulmonary Disease

## 2021-12-22 NOTE — Progress Notes (Signed)
Remote ICD transmission.   

## 2021-12-25 ENCOUNTER — Other Ambulatory Visit (HOSPITAL_COMMUNITY): Payer: Self-pay

## 2021-12-25 NOTE — Telephone Encounter (Signed)
Patient calling back for refill. She is now out of the medication.

## 2021-12-29 ENCOUNTER — Other Ambulatory Visit: Payer: Self-pay | Admitting: Pulmonary Disease

## 2021-12-29 ENCOUNTER — Other Ambulatory Visit (HOSPITAL_COMMUNITY): Payer: Self-pay

## 2021-12-29 ENCOUNTER — Other Ambulatory Visit: Payer: Self-pay | Admitting: Cardiovascular Disease

## 2021-12-29 MED ORDER — POTASSIUM CHLORIDE CRYS ER 10 MEQ PO TBCR
EXTENDED_RELEASE_TABLET | ORAL | 3 refills | Status: DC
  Filled 2021-12-29: qty 30, 30d supply, fill #0
  Filled 2022-01-22: qty 30, 30d supply, fill #1
  Filled 2022-02-22: qty 30, 30d supply, fill #2
  Filled 2022-03-24: qty 30, 30d supply, fill #3

## 2021-12-30 ENCOUNTER — Other Ambulatory Visit (HOSPITAL_COMMUNITY): Payer: Self-pay

## 2021-12-30 MED ORDER — MONTELUKAST SODIUM 10 MG PO TABS
10.0000 mg | ORAL_TABLET | Freq: Every day | ORAL | 0 refills | Status: DC
  Filled 2021-12-30: qty 90, 90d supply, fill #0

## 2021-12-31 ENCOUNTER — Other Ambulatory Visit (HOSPITAL_COMMUNITY): Payer: Self-pay

## 2022-01-01 ENCOUNTER — Ambulatory Visit (INDEPENDENT_AMBULATORY_CARE_PROVIDER_SITE_OTHER): Payer: Medicare HMO | Admitting: Family Medicine

## 2022-01-01 ENCOUNTER — Other Ambulatory Visit (HOSPITAL_COMMUNITY): Payer: Self-pay

## 2022-01-01 ENCOUNTER — Other Ambulatory Visit: Payer: Self-pay

## 2022-01-01 ENCOUNTER — Ambulatory Visit: Payer: Medicare HMO

## 2022-01-01 VITALS — BP 134/87 | HR 89 | Wt 181.2 lb

## 2022-01-01 DIAGNOSIS — E7800 Pure hypercholesterolemia, unspecified: Secondary | ICD-10-CM

## 2022-01-01 DIAGNOSIS — Z794 Long term (current) use of insulin: Secondary | ICD-10-CM | POA: Diagnosis not present

## 2022-01-01 DIAGNOSIS — E1140 Type 2 diabetes mellitus with diabetic neuropathy, unspecified: Secondary | ICD-10-CM | POA: Diagnosis not present

## 2022-01-01 DIAGNOSIS — Z23 Encounter for immunization: Secondary | ICD-10-CM | POA: Diagnosis not present

## 2022-01-01 LAB — POCT GLYCOSYLATED HEMOGLOBIN (HGB A1C): HbA1c, POC (controlled diabetic range): 7.4 % — AB (ref 0.0–7.0)

## 2022-01-01 MED ORDER — LOSARTAN POTASSIUM 25 MG PO TABS
25.0000 mg | ORAL_TABLET | Freq: Every day | ORAL | 0 refills | Status: DC
  Filled 2022-01-01: qty 90, 90d supply, fill #0

## 2022-01-01 NOTE — Assessment & Plan Note (Addendum)
Well-controlled. A1c 7.4% today. Goal A1c 7-7.5%.  -Continue current medications (Farxiga 10mg , Tresiba 40 units, Ozempic 2mg ) -Will start Losartan 25mg  daily for additional renal protection (already on Farxiga) -Lab visit in 2 weeks for BMP. Future orders placed. -Continue statin -Patient to have eye doctor fax notes to our office -UTD on foot exam

## 2022-01-01 NOTE — Progress Notes (Signed)
    SUBJECTIVE:   CHIEF COMPLAINT / HPI:   Type 2 Diabetes Patient is a 71 y.o. female who presents today for diabetes follow-up.  Home medications include: farxiga 10mg  daily, tresiba 40 units daily, ozempic 2mg  weekly Patient reports excellent medication compliance with no missed doses. Patient checks sugar at home. Typically range 110-130 fasting, ~170s postprandial No hypoglycemic episodes/symptoms  Most recent A1Cs:  Lab Results  Component Value Date   HGBA1C 7.4 (A) 01/01/2022   HGBA1C 6.9 08/05/2021   HGBA1C 7.4 (H) 04/15/2021   Last Microalbumin, LDL, Creatinine: Lab Results  Component Value Date   LDLCALC 42 08/05/2021   CREATININE 1.34 (H) 11/27/2021    Patient states she is up to date on diabetic eye. Sees [facility] on [address]. She will ask them to send visit notes to our office. Patient is up to date on diabetic foot exam.  Right Heel Pain Noticed it 3-4 weeks ago. Mild. Onset coincided with increased activity (going to the gym 3x/week). Has not tried anything for relief. Wonders if it's related to a callus.  PERTINENT  PMH / PSH: asthma, pAF, vtach, s/p ICD, HTN, HLD, CKD 3a  OBJECTIVE:   BP 134/87   Pulse 89   Wt 181 lb 3.2 oz (82.2 kg)   LMP 07/26/2011   SpO2 99%   BMI 29.25 kg/m   Gen: NAD, pleasant, able to participate in exam HEENT: [place]/AT, PERRLA, nares patent bilaterally, TM normal bilaterally (cerumen removed from left ear with curette), oropharynx unremarkable Neck: supple, no cervical or supraclavicular lymphadenopathy CV: RRR, normal S1/S2, no murmur Resp: Normal effort, lungs CTAB Extremities: no edema or cyanosis. Mild tenderness over medial calcaneal tubercle on right heel. Skin: warm and dry, no rashes noted Neuro: alert, no obvious focal deficits Psych: Normal affect and mood Diabetic foot exam was performed with the following findings:   No deformities, ulcerations, or other skin breakdown Intact posterior tibialis and dorsalis  pedis pulses Sensation intact to touch bilaterally. Decreased sensation to monofilament testing bilaterally.     ASSESSMENT/PLAN:   Type 2 diabetes mellitus with diabetic neuropathy, with long-term current use of insulin (HCC) Well-controlled. A1c 7.4% today. Goal A1c 7-7.5%.  -Continue current medications (Farxiga 10mg , Tresiba 40 units, Ozempic 2mg ) -Will start Losartan 25mg  daily for additional renal protection (already on Farxiga) -Lab visit in 2 weeks for BMP. Future orders placed. -Continue statin -Patient to have eye doctor fax notes to our office -UTD on foot exam  Right Heel Pain Likely early plantar fasciitis. Recommended ice, [place] sock. She will discuss with her podiatrist at upcoming appointment as well.  Health Maintenance -flu shot administered today  Alcus Dad, MD [place]

## 2022-01-01 NOTE — Patient Instructions (Addendum)
It was great to see you!  Your A1c was 7.4% today, which means your diabetes is well-controlled overall. Your goal A1c is around 7-7.5%. Continue your current medications.  We are going to start a medication called Losartan. You have been on this before. It helps protect your kidneys and also lowers blood pressure. Take it once daily. I sent a prescription to your pharmacy.  We will check labs in 2 weeks after starting this medication.  Please contact your eye doctor to send the results of your diabetic eye exam to our office (our fax # is [phone]).  For your heel: this may be the beginning of plantar fasciitis. You can roll your foot on a frozen water bottle after exercise or when it's bothering you. You can also use a [place] sock (on [employer]) at night time. Ask your podiatrist about this when you see them.  Take care and seek immediate care sooner if you develop any concerns.  Dr. [facility]

## 2022-01-02 DIAGNOSIS — J45909 Unspecified asthma, uncomplicated: Secondary | ICD-10-CM | POA: Diagnosis not present

## 2022-01-02 DIAGNOSIS — K219 Gastro-esophageal reflux disease without esophagitis: Secondary | ICD-10-CM | POA: Diagnosis not present

## 2022-01-02 DIAGNOSIS — F3342 Major depressive disorder, recurrent, in full remission: Secondary | ICD-10-CM | POA: Diagnosis not present

## 2022-01-02 DIAGNOSIS — E1122 Type 2 diabetes mellitus with diabetic chronic kidney disease: Secondary | ICD-10-CM | POA: Diagnosis not present

## 2022-01-02 DIAGNOSIS — D6869 Other thrombophilia: Secondary | ICD-10-CM | POA: Diagnosis not present

## 2022-01-02 DIAGNOSIS — R69 Illness, unspecified: Secondary | ICD-10-CM | POA: Diagnosis not present

## 2022-01-02 DIAGNOSIS — I110 Hypertensive heart disease with heart failure: Secondary | ICD-10-CM | POA: Diagnosis not present

## 2022-01-02 DIAGNOSIS — I252 Old myocardial infarction: Secondary | ICD-10-CM | POA: Diagnosis not present

## 2022-01-02 DIAGNOSIS — I509 Heart failure, unspecified: Secondary | ICD-10-CM | POA: Diagnosis not present

## 2022-01-02 DIAGNOSIS — E261 Secondary hyperaldosteronism: Secondary | ICD-10-CM | POA: Diagnosis not present

## 2022-01-02 DIAGNOSIS — I4891 Unspecified atrial fibrillation: Secondary | ICD-10-CM | POA: Diagnosis not present

## 2022-01-02 DIAGNOSIS — Z008 Encounter for other general examination: Secondary | ICD-10-CM | POA: Diagnosis not present

## 2022-01-02 DIAGNOSIS — E785 Hyperlipidemia, unspecified: Secondary | ICD-10-CM | POA: Diagnosis not present

## 2022-01-02 DIAGNOSIS — Z794 Long term (current) use of insulin: Secondary | ICD-10-CM | POA: Diagnosis not present

## 2022-01-12 ENCOUNTER — Other Ambulatory Visit (HOSPITAL_COMMUNITY): Payer: Self-pay

## 2022-01-12 ENCOUNTER — Other Ambulatory Visit: Payer: Self-pay | Admitting: Family Medicine

## 2022-01-12 MED ORDER — OZEMPIC (2 MG/DOSE) 8 MG/3ML ~~LOC~~ SOPN
PEN_INJECTOR | SUBCUTANEOUS | 2 refills | Status: DC
  Filled 2022-01-12: qty 3, 28d supply, fill #0
  Filled 2022-02-06: qty 3, 28d supply, fill #1
  Filled 2022-03-07: qty 3, 28d supply, fill #2

## 2022-01-15 ENCOUNTER — Other Ambulatory Visit: Payer: Medicare HMO

## 2022-01-15 DIAGNOSIS — E1140 Type 2 diabetes mellitus with diabetic neuropathy, unspecified: Secondary | ICD-10-CM | POA: Diagnosis not present

## 2022-01-15 DIAGNOSIS — Z794 Long term (current) use of insulin: Secondary | ICD-10-CM | POA: Diagnosis not present

## 2022-01-16 LAB — BASIC METABOLIC PANEL
BUN/Creatinine Ratio: 13 (ref 12–28)
BUN: 16 mg/dL (ref 8–27)
CO2: 23 mmol/L (ref 20–29)
Calcium: 8.3 mg/dL — ABNORMAL LOW (ref 8.7–10.3)
Chloride: 99 mmol/L (ref 96–106)
Creatinine, Ser: 1.28 mg/dL — ABNORMAL HIGH (ref 0.57–1.00)
Glucose: 243 mg/dL — ABNORMAL HIGH (ref 70–99)
Potassium: 4.1 mmol/L (ref 3.5–5.2)
Sodium: 138 mmol/L (ref 134–144)
eGFR: 45 mL/min/{1.73_m2} — ABNORMAL LOW (ref >59)

## 2022-01-18 DIAGNOSIS — J449 Chronic obstructive pulmonary disease, unspecified: Secondary | ICD-10-CM | POA: Diagnosis not present

## 2022-01-18 DIAGNOSIS — J45909 Unspecified asthma, uncomplicated: Secondary | ICD-10-CM | POA: Diagnosis not present

## 2022-01-18 DIAGNOSIS — G4733 Obstructive sleep apnea (adult) (pediatric): Secondary | ICD-10-CM | POA: Diagnosis not present

## 2022-01-22 ENCOUNTER — Other Ambulatory Visit (HOSPITAL_COMMUNITY): Payer: Self-pay

## 2022-01-23 ENCOUNTER — Telehealth: Payer: Self-pay | Admitting: Podiatry

## 2022-01-23 ENCOUNTER — Encounter: Payer: Self-pay | Admitting: Podiatry

## 2022-01-23 NOTE — Telephone Encounter (Signed)
Pt calling checking on her diabetic shoes she was measured for a while back and has not heard anything. Please call pt ans advise.Marland KitchenMarland Kitchen

## 2022-01-26 ENCOUNTER — Ambulatory Visit (INDEPENDENT_AMBULATORY_CARE_PROVIDER_SITE_OTHER): Payer: Medicare HMO | Admitting: Podiatry

## 2022-01-26 ENCOUNTER — Ambulatory Visit (INDEPENDENT_AMBULATORY_CARE_PROVIDER_SITE_OTHER): Payer: Medicare HMO

## 2022-01-26 DIAGNOSIS — I5032 Chronic diastolic (congestive) heart failure: Secondary | ICD-10-CM

## 2022-01-26 DIAGNOSIS — M2041 Other hammer toe(s) (acquired), right foot: Secondary | ICD-10-CM

## 2022-01-26 DIAGNOSIS — Z95810 Presence of automatic (implantable) cardiac defibrillator: Secondary | ICD-10-CM

## 2022-01-26 DIAGNOSIS — E1140 Type 2 diabetes mellitus with diabetic neuropathy, unspecified: Secondary | ICD-10-CM

## 2022-01-26 DIAGNOSIS — Z794 Long term (current) use of insulin: Secondary | ICD-10-CM

## 2022-01-26 DIAGNOSIS — M2042 Other hammer toe(s) (acquired), left foot: Secondary | ICD-10-CM

## 2022-01-26 DIAGNOSIS — E1142 Type 2 diabetes mellitus with diabetic polyneuropathy: Secondary | ICD-10-CM

## 2022-01-27 NOTE — Progress Notes (Signed)
EPIC Encounter for ICM Monitoring  Patient Name: [name] is a 71 y.o. female Date: 01/27/2022 Primary Care Physican: Alcus Dad, MD Primary Cardiologist: Croitoru Electrophysiologist: Croitoru 09/04/2021 Weight: 180 lbs 11/13/2021 Weight: 179 lbs 01/27/2022 Weight: 179-180 lbs   Since 16-Dec-2021 AT/AF 24 Time in AT/AF 0.1 hr/day (0.5%) Longest AT/AF 65 minutes                                  Spoke with patient and heart failure questions reviewed.  Transmission results reviewed.  Pt asymptomatic for fluid accumulation.  Reports feeling well at this time and voices no complaints.     Optivol thoracic impedance suggesting normal fluid levels.   Prescribed dosage:  Torsemide 20 mg 1 tablet by mouth daily.   Potassium 10 mEq 1 tablet by mouth by mouth daily.   Labs: 01/15/2022 Creatinine 1.28, BUN 16, Potassium 4.1, Sodium 138, GFR 45 11/27/2021 Creatinine 1.34, BUN 19, Potassium 4.8, Sodium 141, GFR 43 11/06/2021 Creatinine 1.43, BUN 18, Potassium 5.4, Sodium 138, GFR 39 04/29/2021 Creatinine 1.14, BUN 22, Potassium 4.0, Sodium 133, GFR 52 04/15/2021 Creatinine 1.26, BUN 21, Potassium 4.6, Sodium 138, GFR 46 A complete set of results can be found in Results Review.   Recommendations:  No changes and encouraged to call if experiencing any fluid symptoms.   Follow-up plan: ICM clinic phone appointment on 03/02/2022.   91 day device clinic remote transmission 02/18/2022.     Next office visit scheduled:  Recall 10/15/2022 for Dr Sallyanne Kuster.     Copy of ICM check sent to Dr. Sallyanne Kuster.      3 month ICM trend: 01/26/2022.    12-14 Month ICM trend:     [name], RN 01/27/2022 12:46 PM

## 2022-02-02 ENCOUNTER — Ambulatory Visit: Payer: Medicare HMO | Admitting: Podiatry

## 2022-02-06 ENCOUNTER — Other Ambulatory Visit (HOSPITAL_COMMUNITY): Payer: Self-pay

## 2022-02-09 ENCOUNTER — Ambulatory Visit: Payer: Medicare HMO | Admitting: Podiatry

## 2022-02-09 ENCOUNTER — Other Ambulatory Visit: Payer: Self-pay | Admitting: Family Medicine

## 2022-02-09 ENCOUNTER — Other Ambulatory Visit (HOSPITAL_COMMUNITY): Payer: Self-pay

## 2022-02-09 ENCOUNTER — Encounter: Payer: Self-pay | Admitting: Podiatry

## 2022-02-09 DIAGNOSIS — Z794 Long term (current) use of insulin: Secondary | ICD-10-CM

## 2022-02-09 DIAGNOSIS — E1140 Type 2 diabetes mellitus with diabetic neuropathy, unspecified: Secondary | ICD-10-CM | POA: Diagnosis not present

## 2022-02-09 DIAGNOSIS — L84 Corns and callosities: Secondary | ICD-10-CM

## 2022-02-09 DIAGNOSIS — M79609 Pain in unspecified limb: Secondary | ICD-10-CM | POA: Diagnosis not present

## 2022-02-09 DIAGNOSIS — B351 Tinea unguium: Secondary | ICD-10-CM

## 2022-02-09 NOTE — Progress Notes (Unsigned)
  Subjective:  Patient ID: [name], female    DOB: 1950/10/27,  MRN: [id number]  [name] presents to clinic today for diabetic foot care with h/o diabetic neuropathy. She is seen for multiple corns/calluses and painful mycotic toenails. Chief Complaint  Patient presents with   Nail Problem    Dfc BG - 122 , this morning  A1C - 7.4 , July 2023 PCP - Dr Rock Nephew , last OV July 2023   New problem(s): None.   PCP is Alcus Dad, MD.  Allergies  Allergen Reactions   Avelox [Moxifloxacin Hcl In Nacl] Other (See Comments)    Cardiac arrest per pt   Nsaids Other (See Comments)    Cardiac arrest per pt    Simvastatin Other (See Comments)    Muscle pain   Ace Inhibitors Other (See Comments) and Cough   Jardiance [Empagliflozin] Other (See Comments)    Felt "crazy", fatigue, sweating, denies hypoglycemia while taking   Latex Rash    Review of Systems: Negative except as noted in the HPI.  Objective: No changes noted in today's physical examination.  [name] is a pleasant 71 y.o. female WD, WN in NAD. AAO x 3.  Vascular Examination: CFT <3 seconds b/l. DP/PT pulses faintly palpable b/l. Skin temperature gradient warm to warm b/l. No ischemia or gangrene. No cyanosis or clubbing noted b/l. Pedal hair sparse. No pain with calf compression b/l.   Neurological Examination: Protective sensation decreased with 10 gram monofilament b/l. Vibratory sensation diminished b/l.  Dermatological Examination: Toenails 1-5 b/l thick, discolored, elongated with subungual debris and pain on dorsal palpation.  Pedal skin is warm and supple b/l LE. No open wounds b/l LE. No interdigital macerations noted b/l LE.   Hyperkeratotic lesion(s) with noo erythema, no edema, no drainage, no fluctuance: distal tip of left 2nd toe Distal tip of left 5th toe distal tip of right 2nd toe distal tip of left 3rd toe distal tip of right 4th toe plantar IPJ of left great toe plantar IPJ of right  great toe submet head 2 right foot submet head 3 left foot submet head 3 right foot 1st metatarsal head LLE 1st metatarsal head RLE  Musculoskeletal Examination: Muscle strength 5/5 to b/l LE. Hammertoe deformity noted 2-5 b/l.  Radiographs: None  Assessment/Plan: 1. Pain due to onychomycosis of nail   2. Corns and callosities   3. Type 2 diabetes mellitus with diabetic neuropathy, with long-term current use of insulin ([place])     No orders of the defined types were placed in this encounter.   -Consent given for treatment as described below: -Examined patient. -Continue diabetic foot care principles: inspect feet daily, monitor glucose as recommended by PCP and/or Endocrinologist, and follow prescribed diet per PCP, Endocrinologist and/or dietician. -Mycotic toenails 1-5 bilaterally were debrided in length and girth with sterile nail nippers and dremel without incident. -Corn(s) distal tip of L 5th toe, distal tip of left 2nd toe, distal tip of right 2nd toe, distal tip of left 3rd toe, and distal tip of right 4th toe and callus(es) medial IPJ of right great toe, plantar IPJ of left great toe, submet head 2 right foot, submet head 3 b/l, and 1st metatarsal head b/l lower extremities were pared utilizing sterile scalpel blade without incident. Total number debrided =12. -Patient/POA to call should there be question/concern in the interim.   Return in about 3 months (around 05/12/2022).  Marzetta Board, DPM

## 2022-02-10 ENCOUNTER — Other Ambulatory Visit (HOSPITAL_COMMUNITY): Payer: Self-pay

## 2022-02-10 MED ORDER — FAMOTIDINE 20 MG PO TABS
20.0000 mg | ORAL_TABLET | Freq: Every day | ORAL | 0 refills | Status: DC
  Filled 2022-02-10: qty 90, 90d supply, fill #0

## 2022-02-12 ENCOUNTER — Other Ambulatory Visit (HOSPITAL_COMMUNITY): Payer: Self-pay

## 2022-02-13 ENCOUNTER — Other Ambulatory Visit (HOSPITAL_COMMUNITY): Payer: Self-pay

## 2022-02-14 ENCOUNTER — Other Ambulatory Visit (HOSPITAL_COMMUNITY): Payer: Self-pay

## 2022-02-18 ENCOUNTER — Ambulatory Visit (INDEPENDENT_AMBULATORY_CARE_PROVIDER_SITE_OTHER): Payer: Medicare HMO

## 2022-02-18 DIAGNOSIS — I472 Ventricular tachycardia, unspecified: Secondary | ICD-10-CM | POA: Diagnosis not present

## 2022-02-19 LAB — CUP PACEART REMOTE DEVICE CHECK
Battery Remaining Longevity: 37 mo
Battery Voltage: 2.97 V
Brady Statistic AP VP Percent: 0.76 %
Brady Statistic AP VS Percent: 96.23 %
Brady Statistic AS VP Percent: 0.01 %
Brady Statistic AS VS Percent: 3 %
Brady Statistic RA Percent Paced: 95.01 %
Brady Statistic RV Percent Paced: 0.9 %
Date Time Interrogation Session: 20231108012204
HighPow Impedance: 77 Ohm
Implantable Lead Connection Status: 753985
Implantable Lead Connection Status: 753985
Implantable Lead Implant Date: 20120120
Implantable Lead Implant Date: 20120120
Implantable Lead Location: 753859
Implantable Lead Location: 753860
Implantable Lead Model: 5076
Implantable Lead Model: 6935
Implantable Pulse Generator Implant Date: 20190618
Lead Channel Impedance Value: 342 Ohm
Lead Channel Impedance Value: 437 Ohm
Lead Channel Impedance Value: 437 Ohm
Lead Channel Pacing Threshold Amplitude: 0.5 V
Lead Channel Pacing Threshold Amplitude: 0.75 V
Lead Channel Pacing Threshold Pulse Width: 0.4 ms
Lead Channel Pacing Threshold Pulse Width: 0.4 ms
Lead Channel Sensing Intrinsic Amplitude: 0.375 mV
Lead Channel Sensing Intrinsic Amplitude: 0.375 mV
Lead Channel Sensing Intrinsic Amplitude: 11.5 mV
Lead Channel Sensing Intrinsic Amplitude: 11.5 mV
Lead Channel Setting Pacing Amplitude: 2 V
Lead Channel Setting Pacing Amplitude: 2.5 V
Lead Channel Setting Pacing Pulse Width: 0.4 ms
Lead Channel Setting Sensing Sensitivity: 0.3 mV
Zone Setting Status: 755011
Zone Setting Status: 755011

## 2022-02-20 NOTE — Telephone Encounter (Signed)
Lvm for pt to call back. New paperwork has been printed to be signed

## 2022-02-22 ENCOUNTER — Other Ambulatory Visit: Payer: Self-pay | Admitting: Family Medicine

## 2022-02-23 ENCOUNTER — Other Ambulatory Visit (HOSPITAL_COMMUNITY): Payer: Self-pay

## 2022-02-23 MED ORDER — TRESIBA FLEXTOUCH 100 UNIT/ML ~~LOC~~ SOPN
40.0000 [IU] | PEN_INJECTOR | Freq: Every day | SUBCUTANEOUS | 2 refills | Status: DC
  Filled 2022-02-23: qty 15, 38d supply, fill #0
  Filled 2022-03-26 – 2022-03-30 (×2): qty 15, 38d supply, fill #1
  Filled 2022-05-06: qty 15, 38d supply, fill #2

## 2022-02-24 ENCOUNTER — Other Ambulatory Visit (HOSPITAL_COMMUNITY): Payer: Self-pay

## 2022-03-02 ENCOUNTER — Ambulatory Visit (INDEPENDENT_AMBULATORY_CARE_PROVIDER_SITE_OTHER): Payer: Medicare HMO

## 2022-03-02 DIAGNOSIS — Z95810 Presence of automatic (implantable) cardiac defibrillator: Secondary | ICD-10-CM

## 2022-03-02 DIAGNOSIS — I5032 Chronic diastolic (congestive) heart failure: Secondary | ICD-10-CM

## 2022-03-04 DIAGNOSIS — E119 Type 2 diabetes mellitus without complications: Secondary | ICD-10-CM | POA: Diagnosis not present

## 2022-03-04 NOTE — Progress Notes (Signed)
EPIC Encounter for ICM Monitoring  Patient Name: [name] is a 71 y.o. female Date: 03/04/2022 Primary Care Physican: Maury Dus, MD Primary Cardiologist: Croitoru Electrophysiologist: Croitoru 09/04/2021 Weight: 180 lbs 11/13/2021 Weight: 179 lbs 01/27/2022 Weight: 179-180 lbs  Since 18-Feb-2022 0.0 hr/day (0.0%)                                  Spoke with patient and heart failure questions reviewed.  Transmission results reviewed.  Pt asymptomatic for fluid accumulation.  Reports feeling well at this time and voices no complaints.     Optivol thoracic impedance suggesting normal fluid levels.   Prescribed dosage:  Torsemide 20 mg 1 tablet by mouth daily.   Potassium 10 mEq 1 tablet by mouth by mouth daily.   Labs: 01/15/2022 Creatinine 1.28, BUN 16, Potassium 4.1, Sodium 138, GFR 45 11/27/2021 Creatinine 1.34, BUN 19, Potassium 4.8, Sodium 141, GFR 43 11/06/2021 Creatinine 1.43, BUN 18, Potassium 5.4, Sodium 138, GFR 39 04/29/2021 Creatinine 1.14, BUN 22, Potassium 4.0, Sodium 133, GFR 52 04/15/2021 Creatinine 1.26, BUN 21, Potassium 4.6, Sodium 138, GFR 46 A complete set of results can be found in Results Review.   Recommendations:  No changes and encouraged to call if experiencing any fluid symptoms.   Follow-up plan: ICM clinic phone appointment on 04/07/2022.   91 day device clinic remote transmission 05/19/2022.     Next office visit scheduled:  Recall 10/15/2022 for Dr Royann Shivers.     Copy of ICM check sent to Dr. Royann Shivers.      3 month ICM trend: 03/02/2022.    12-14 Month ICM trend:     [name], RN 03/04/2022 2:50 PM

## 2022-03-07 ENCOUNTER — Other Ambulatory Visit (HOSPITAL_COMMUNITY): Payer: Self-pay

## 2022-03-09 ENCOUNTER — Other Ambulatory Visit (HOSPITAL_COMMUNITY): Payer: Self-pay

## 2022-03-10 ENCOUNTER — Other Ambulatory Visit: Payer: Self-pay | Admitting: Family Medicine

## 2022-03-10 ENCOUNTER — Other Ambulatory Visit: Payer: Self-pay | Admitting: Cardiovascular Disease

## 2022-03-11 ENCOUNTER — Other Ambulatory Visit (HOSPITAL_COMMUNITY): Payer: Self-pay

## 2022-03-11 MED ORDER — BISOPROLOL FUMARATE 10 MG PO TABS
10.0000 mg | ORAL_TABLET | Freq: Two times a day (BID) | ORAL | 3 refills | Status: DC
  Filled 2022-03-11: qty 180, 90d supply, fill #0
  Filled 2022-06-08: qty 180, 90d supply, fill #1
  Filled 2022-09-05: qty 180, 90d supply, fill #2
  Filled 2022-12-02: qty 180, 90d supply, fill #3

## 2022-03-11 MED ORDER — BUPROPION HCL ER (XL) 150 MG PO TB24
150.0000 mg | ORAL_TABLET | Freq: Every day | ORAL | 3 refills | Status: DC
  Filled 2022-03-11: qty 90, 90d supply, fill #0
  Filled 2022-06-08: qty 90, 90d supply, fill #1
  Filled 2022-09-05: qty 90, 90d supply, fill #2
  Filled 2022-12-02: qty 90, 90d supply, fill #3

## 2022-03-12 ENCOUNTER — Other Ambulatory Visit (HOSPITAL_COMMUNITY): Payer: Self-pay

## 2022-03-13 ENCOUNTER — Other Ambulatory Visit (HOSPITAL_COMMUNITY): Payer: Self-pay

## 2022-03-13 NOTE — Progress Notes (Signed)
Remote ICD transmission.   

## 2022-03-15 ENCOUNTER — Other Ambulatory Visit: Payer: Self-pay | Admitting: Family Medicine

## 2022-03-16 ENCOUNTER — Other Ambulatory Visit (HOSPITAL_COMMUNITY): Payer: Self-pay

## 2022-03-16 MED ORDER — TECHLITE PEN NEEDLES 32G X 4 MM MISC
2 refills | Status: DC
  Filled 2022-03-16: qty 100, 100d supply, fill #0
  Filled 2022-06-29 – 2022-07-09 (×2): qty 100, 100d supply, fill #1
  Filled 2022-10-13 (×2): qty 100, 100d supply, fill #2

## 2022-03-24 ENCOUNTER — Other Ambulatory Visit (HOSPITAL_COMMUNITY): Payer: Self-pay

## 2022-03-24 ENCOUNTER — Other Ambulatory Visit: Payer: Self-pay | Admitting: Family Medicine

## 2022-03-24 ENCOUNTER — Other Ambulatory Visit: Payer: Self-pay | Admitting: Pulmonary Disease

## 2022-03-24 DIAGNOSIS — E1140 Type 2 diabetes mellitus with diabetic neuropathy, unspecified: Secondary | ICD-10-CM

## 2022-03-25 ENCOUNTER — Other Ambulatory Visit: Payer: Self-pay

## 2022-03-25 MED ORDER — LOSARTAN POTASSIUM 25 MG PO TABS
25.0000 mg | ORAL_TABLET | Freq: Every day | ORAL | 0 refills | Status: DC
  Filled 2022-03-25: qty 90, 90d supply, fill #0

## 2022-03-26 ENCOUNTER — Other Ambulatory Visit (HOSPITAL_COMMUNITY): Payer: Self-pay

## 2022-03-26 ENCOUNTER — Other Ambulatory Visit: Payer: Self-pay | Admitting: Pulmonary Disease

## 2022-03-30 ENCOUNTER — Other Ambulatory Visit (HOSPITAL_COMMUNITY): Payer: Self-pay

## 2022-04-01 ENCOUNTER — Telehealth: Payer: Self-pay | Admitting: Pulmonary Disease

## 2022-04-01 ENCOUNTER — Other Ambulatory Visit: Payer: Self-pay

## 2022-04-01 MED ORDER — MONTELUKAST SODIUM 10 MG PO TABS
10.0000 mg | ORAL_TABLET | Freq: Every day | ORAL | 0 refills | Status: DC
  Filled 2022-04-01: qty 90, 90d supply, fill #0

## 2022-04-01 NOTE — Telephone Encounter (Signed)
Sent refill in for singular. Called and advised patient. Nothing further needed at this time

## 2022-04-06 ENCOUNTER — Other Ambulatory Visit: Payer: Self-pay | Admitting: Family Medicine

## 2022-04-07 ENCOUNTER — Ambulatory Visit (INDEPENDENT_AMBULATORY_CARE_PROVIDER_SITE_OTHER): Payer: Medicare HMO

## 2022-04-07 DIAGNOSIS — Z95810 Presence of automatic (implantable) cardiac defibrillator: Secondary | ICD-10-CM | POA: Diagnosis not present

## 2022-04-07 DIAGNOSIS — I5032 Chronic diastolic (congestive) heart failure: Secondary | ICD-10-CM | POA: Diagnosis not present

## 2022-04-08 ENCOUNTER — Other Ambulatory Visit: Payer: Self-pay

## 2022-04-08 ENCOUNTER — Other Ambulatory Visit (HOSPITAL_COMMUNITY): Payer: Self-pay

## 2022-04-08 MED ORDER — OZEMPIC (2 MG/DOSE) 8 MG/3ML ~~LOC~~ SOPN
2.0000 mg | PEN_INJECTOR | SUBCUTANEOUS | 2 refills | Status: DC
  Filled 2022-04-08 (×2): qty 3, 28d supply, fill #0
  Filled 2022-05-04: qty 3, 28d supply, fill #1
  Filled 2022-05-29: qty 3, 28d supply, fill #2

## 2022-04-10 DIAGNOSIS — H35342 Macular cyst, hole, or pseudohole, left eye: Secondary | ICD-10-CM | POA: Diagnosis not present

## 2022-04-10 DIAGNOSIS — H524 Presbyopia: Secondary | ICD-10-CM | POA: Diagnosis not present

## 2022-04-10 DIAGNOSIS — Z961 Presence of intraocular lens: Secondary | ICD-10-CM | POA: Diagnosis not present

## 2022-04-10 DIAGNOSIS — H5203 Hypermetropia, bilateral: Secondary | ICD-10-CM | POA: Diagnosis not present

## 2022-04-10 DIAGNOSIS — H2513 Age-related nuclear cataract, bilateral: Secondary | ICD-10-CM | POA: Diagnosis not present

## 2022-04-10 DIAGNOSIS — H52223 Regular astigmatism, bilateral: Secondary | ICD-10-CM | POA: Diagnosis not present

## 2022-04-10 DIAGNOSIS — E119 Type 2 diabetes mellitus without complications: Secondary | ICD-10-CM | POA: Diagnosis not present

## 2022-04-10 NOTE — Progress Notes (Signed)
EPIC Encounter for ICM Monitoring  Patient Name: [name] is a 71 y.o. female Date: 04/10/2022 Primary Care Physican: Maury Dus, MD Primary Cardiologist: Croitoru Electrophysiologist: Croitoru 09/04/2021 Weight: 180 lbs 11/13/2021 Weight: 179 lbs 01/27/2022 Weight: 179-180 lbs 04/10/2022 Weight: 181 lbs   Since 02-Mar-2022 AT/AF 29 Time in AT/AF 0.3 hr/day (1.2%) Longest AT/AF 2 hours                                  Spoke with patient and heart failure questions reviewed.  Transmission results reviewed.  Pt asymptomatic for fluid accumulation.  Reports feeling well at this time and voices no complaints.     Optivol thoracic impedance suggesting normal fluid levels.   Prescribed dosage:  Torsemide 20 mg 1 tablet by mouth daily.   Potassium 10 mEq 1 tablet by mouth by mouth daily.   Labs: 01/15/2022 Creatinine 1.28, BUN 16, Potassium 4.1, Sodium 138, GFR 45 11/27/2021 Creatinine 1.34, BUN 19, Potassium 4.8, Sodium 141, GFR 43 11/06/2021 Creatinine 1.43, BUN 18, Potassium 5.4, Sodium 138, GFR 39 04/29/2021 Creatinine 1.14, BUN 22, Potassium 4.0, Sodium 133, GFR 52 04/15/2021 Creatinine 1.26, BUN 21, Potassium 4.6, Sodium 138, GFR 46 A complete set of results can be found in Results Review.   Recommendations:  No changes and encouraged to call if experiencing any fluid symptoms.   Follow-up plan: ICM clinic phone appointment on 05/11/2022.   91 day device clinic remote transmission 05/19/2022.     Next office visit scheduled:  Recall 10/15/2022 for Dr Royann Shivers.     Copy of ICM check sent to Dr. Royann Shivers.      3 month ICM trend: 04/07/2022.    12-14 Month ICM trend:     [name], RN 04/10/2022 3:04 PM

## 2022-04-18 ENCOUNTER — Other Ambulatory Visit: Payer: Self-pay | Admitting: Cardiovascular Disease

## 2022-04-18 DIAGNOSIS — J449 Chronic obstructive pulmonary disease, unspecified: Secondary | ICD-10-CM | POA: Diagnosis not present

## 2022-04-18 DIAGNOSIS — G4733 Obstructive sleep apnea (adult) (pediatric): Secondary | ICD-10-CM | POA: Diagnosis not present

## 2022-04-18 DIAGNOSIS — J45909 Unspecified asthma, uncomplicated: Secondary | ICD-10-CM | POA: Diagnosis not present

## 2022-04-20 ENCOUNTER — Other Ambulatory Visit (HOSPITAL_COMMUNITY): Payer: Self-pay

## 2022-04-20 MED ORDER — POTASSIUM CHLORIDE CRYS ER 10 MEQ PO TBCR
EXTENDED_RELEASE_TABLET | ORAL | 3 refills | Status: DC
  Filled 2022-04-20 – 2022-04-21 (×2): qty 30, 30d supply, fill #0
  Filled 2022-05-21: qty 30, 30d supply, fill #1
  Filled 2022-06-18: qty 30, 30d supply, fill #2
  Filled 2022-07-15: qty 30, 30d supply, fill #3

## 2022-04-21 ENCOUNTER — Other Ambulatory Visit: Payer: Self-pay

## 2022-04-21 ENCOUNTER — Other Ambulatory Visit (HOSPITAL_COMMUNITY): Payer: Self-pay

## 2022-05-04 ENCOUNTER — Other Ambulatory Visit (HOSPITAL_COMMUNITY): Payer: Self-pay

## 2022-05-05 ENCOUNTER — Ambulatory Visit (HOSPITAL_COMMUNITY)
Admission: RE | Admit: 2022-05-05 | Discharge: 2022-05-05 | Disposition: A | Discharge status: Home / Self Care | Payer: Medicare HMO | Source: Ambulatory Visit | Attending: Nurse Practitioner | Admitting: Nurse Practitioner

## 2022-05-05 ENCOUNTER — Encounter (HOSPITAL_COMMUNITY): Payer: Self-pay | Admitting: Nurse Practitioner

## 2022-05-05 VITALS — BP 140/72 | HR 83 | Ht 66.0 in | Wt 190.0 lb

## 2022-05-05 DIAGNOSIS — I509 Heart failure, unspecified: Secondary | ICD-10-CM | POA: Insufficient documentation

## 2022-05-05 DIAGNOSIS — Z5181 Encounter for therapeutic drug level monitoring: Secondary | ICD-10-CM | POA: Diagnosis not present

## 2022-05-05 DIAGNOSIS — D6869 Other thrombophilia: Secondary | ICD-10-CM

## 2022-05-05 DIAGNOSIS — I110 Hypertensive heart disease with heart failure: Secondary | ICD-10-CM | POA: Diagnosis not present

## 2022-05-05 DIAGNOSIS — E119 Type 2 diabetes mellitus without complications: Secondary | ICD-10-CM | POA: Insufficient documentation

## 2022-05-05 DIAGNOSIS — Z794 Long term (current) use of insulin: Secondary | ICD-10-CM | POA: Diagnosis not present

## 2022-05-05 DIAGNOSIS — I480 Paroxysmal atrial fibrillation: Secondary | ICD-10-CM | POA: Diagnosis not present

## 2022-05-05 DIAGNOSIS — Z95810 Presence of automatic (implantable) cardiac defibrillator: Secondary | ICD-10-CM | POA: Diagnosis not present

## 2022-05-05 DIAGNOSIS — Z7901 Long term (current) use of anticoagulants: Secondary | ICD-10-CM | POA: Diagnosis not present

## 2022-05-05 LAB — CBC
HCT: 40.8 % (ref 36.0–46.0)
Hemoglobin: 12.8 g/dL (ref 12.0–15.0)
MCH: 29.2 pg (ref 26.0–34.0)
MCHC: 31.4 g/dL (ref 30.0–36.0)
MCV: 93.2 fL (ref 80.0–100.0)
Platelets: 183 10*3/uL (ref 150–400)
RBC: 4.38 MIL/uL (ref 3.87–5.11)
RDW: 14 % (ref 11.5–15.5)
WBC: 5.2 10*3/uL (ref 4.0–10.5)
nRBC: 0 % (ref 0.0–0.2)

## 2022-05-05 LAB — BASIC METABOLIC PANEL
Anion gap: 7 (ref 5–15)
BUN: 15 mg/dL (ref 8–23)
CO2: 26 mmol/L (ref 22–32)
Calcium: 8.2 mg/dL — ABNORMAL LOW (ref 8.9–10.3)
Chloride: 103 mmol/L (ref 98–111)
Creatinine, Ser: 1.12 mg/dL — ABNORMAL HIGH (ref 0.44–1.00)
GFR, Estimated: 53 mL/min — ABNORMAL LOW (ref >60)
Glucose, Bld: 142 mg/dL — ABNORMAL HIGH (ref 70–99)
Potassium: 4.5 mmol/L (ref 3.5–5.1)
Sodium: 136 mmol/L (ref 135–145)

## 2022-05-05 LAB — MAGNESIUM: Magnesium: 2.2 mg/dL (ref 1.7–2.4)

## 2022-05-05 NOTE — Progress Notes (Signed)
Primary Care Physician: Alcus Dad, MD Referring Physician:Dr. Croitoru Cardiologist: Dr. Smitty Pluck is a 72 y.o. female with a h/o ICD, VT, HF, DMT2, afib on dofetilide that is in the afib clinic for Tikosyn surveillance. Her last paceart report 11/23 showed very low burden of afib. She states compliance with her Tikosyn and anticoagulation. She has no complaints today.   Today, she denies symptoms of palpitations, chest pain, shortness of breath, orthopnea, PND, lower extremity edema, dizziness, presyncope, syncope, or neurologic sequela. The patient is tolerating medications without difficulties and is otherwise without complaint today.   Past Medical History:  Diagnosis Date   AICD (automatic cardioverter/defibrillator) present    Anemia    Anxiety    Arthritis    Arthritis of carpometacarpal (CMC) joint of thumb 03/26/2016   Asthma    has had multiple hospitalizations for this   Asthma, moderate persistent 12/10/2011   Atrial fibrillation ([place])    ablation x 2 [facility], 01/2006, 2011.  on warfarin   Cardiac arrest ([place]) 04/2010   in hospital for pneumonia when this occured- occured at the hospital   CHF (congestive heart failure) ([place]) 2012   Echo 08/08/10 by [facility]. EF 35-45%. LV systolic function moderately reduced. Moderate global hypokinesis of LV.  RV systolic function moderately reduced. Mild MR. Trace AR.   Chronic diastolic heart failure ([place]) 01/07/2018   CKD (chronic kidney disease) stage 2, GFR 60-89 ml/min 02/22/2012   Her cr range 1.2-1.5 since Jan 2012 after hospitalization. 10/13 Estimated Creatinine Clearance: 69.3 ml/min (by C-G formula based on Cr of 1).     Complication of anesthesia    difficult time waking up after anesthesia   Depression    Diabetes mellitus    Type 2   Diabetic neuropathy ([place]) 09/18/2011   GASTROESOPHAGEAL REFLUX, NO ESOPHAGITIS 06/10/2006   Qualifier: Diagnosis of  By: Eusebio Friendly     GERD  (gastroesophageal reflux disease) 03/23/2018   History of hiatal hernia    History of kidney stones    HYPERCHOLESTEROLEMIA 06/10/2006   LDL 93 at 01/2011 check-  Continue pravastatin 20mg  po daily.  --discuss health modifications and possibly increaseing dose at next appt.  Cardiology- Dr little- stopped pravachol on 05/21/11- 2/2 allergies?     Hyperlipidemia    Hypertension    ICD (St. Jude Protecta dual-chamber),secondary prevention (VF arrest) January 2012 11/19/2012   Iliotibial band syndrome, left 11/01/2018   Limb pain 06/04/2008   LLE, Baker's cyst in popliteal fossa, no DVT   Long term current use of anticoagulant therapy 08/21/2015   Non-ischemic cardiomyopathy ([place])    echo 08/08/10 - EF 97-98% LV and RV systolic function mod reduced   Obesity (BMI 30-39.9) 01/04/2014   OSA on CPAP 06/10/2006   Sleep study 02/2014 : severe apnea, corrected with nasal pillows and 8cmh2o     Osteoarthritis of right knee 08/28/2008   Qualifier: Diagnosis of  By: Barbaraann Barthel MD, Audelia Acton     Primary localized osteoarthritis of right knee    Sleep apnea    wears CPAP nightly   Type 2 diabetes mellitus with diabetic neuropathy, with long-term current use of insulin ([place]) 06/10/2006              Ventricular tachycardia ([place]) 05/22/2015   Visit for monitoring Tikosyn therapy 08/21/2015   Vocal cord disease    Past Surgical History:  Procedure Laterality Date   BREAST EXCISIONAL BIOPSY Right 1999   BREAST  LUMPECTOMY Right    CARDIAC DEFIBRILLATOR PLACEMENT  05/02/10   Medtronic Protecta XT-DR for CHF-VT, last download 04/12/12   CHOLECYSTECTOMY     COLONOSCOPY     HAMMER TOE SURGERY Right    ICD GENERATOR CHANGEOUT N/A 09/28/2017   Procedure: ICD GENERATOR CHANGEOUT;  Surgeon: Sanda Klein, MD;  Location: [facility];  Service: Cardiovascular;  Laterality: N/A;   KNEE ARTHROSCOPY Right    PAF Ablation     By Dr Ola Spurr. Now sees Dr Tilden Dome at [facility] Left  09/17/2014   Procedure: TOTAL KNEE ARTHROPLASTY;  Surgeon: Elsie Saas, MD;  Location: [facility];  Service: Orthopedics;  Laterality: Left;  pt has ICD   TOTAL KNEE ARTHROPLASTY Right 04/28/2021   Procedure: TOTAL KNEE ARTHROPLASTY;  Surgeon: Willaim Sheng, MD;  Location: [facility];  Service: Orthopedics;  Laterality: Right;   TUBAL LIGATION      Current Outpatient Medications  Medication Sig Dispense Refill   acetaminophen (TYLENOL 8 HOUR) 650 MG CR tablet Take 1 tablet (650 mg total) by mouth every 8 (eight) hours as needed for pain. 90 tablet 2   apixaban (ELIQUIS) 5 MG TABS tablet Take 1 tablet (5 mg total) by mouth 2 (two) times daily. 180 tablet 1   bisoprolol (ZEBETA) 10 MG tablet Take 1 tablet (10 mg total) by mouth in the morning and at bedtime. 180 tablet 3   budesonide-formoterol (SYMBICORT) 80-4.5 MCG/ACT inhaler INHALE 2 PUFFS INTO THE LUNGS IN THE MORNING AND AT BEDTIME 10.2 g 11   buPROPion (WELLBUTRIN XL) 150 MG 24 hr tablet Take 1 tablet (150 mg total) by mouth daily. 90 tablet 3   dapagliflozin propanediol (FARXIGA) 10 MG TABS tablet Take 1 tablet (10 mg total) by mouth daily. 90 tablet 3   diclofenac Sodium (VOLTAREN) 1 % GEL Apply 2 g topically 4 (four) times daily. 100 g 1   dofetilide (TIKOSYN) 250 MCG capsule TAKE 1 CAPSULE  BY MOUTH TWICE DAILY 180 capsule 3   famotidine (PEPCID) 20 MG tablet Take 1 tablet (20 mg total) by mouth daily. 90 tablet 0   fluticasone (FLONASE) 50 MCG/ACT nasal spray instill 1 spray into each nostril twice a day 16 g 5   glucose blood test strip Use as instructed 100 each 12   insulin degludec (TRESIBA FLEXTOUCH) 100 UNIT/ML FlexTouch Pen Inject 40 Units into the skin daily. 15 mL 2   Insulin Pen Needle (TECHLITE PEN NEEDLES) 32G X 4 MM MISC Use as directed with insulin once daily 100 each 2   levalbuterol (XOPENEX HFA) 45 MCG/ACT inhaler INHALE 1 TO 2 PUFFS BY MOUTH INTO THE LUNGS EVERY 4 HOURS IF NEEDED FOR WHEEZING 1 Inhaler 1   levalbuterol  (XOPENEX) 0.63 MG/3ML nebulizer solution One vial in nebulizer four times daily as needed 360 mL 5   losartan (COZAAR) 25 MG tablet Take 1 tablet (25 mg total) by mouth at bedtime. 90 tablet 0   montelukast (SINGULAIR) 10 MG tablet Take 1 tablet (10 mg total) by mouth at bedtime. 90 tablet 0   potassium chloride (KLOR-CON M) 10 MEQ tablet Take 1 tablet by mouth daily as directed. 30 tablet 3   PRODIGY LANCETS 28G MISC 1 Units by Does not apply route 4 (four) times daily. 100 each 4   rosuvastatin (CRESTOR) 10 MG tablet TAKE 1 TABLET BY MOUTH DAILY 90 tablet 2   Semaglutide, 2 MG/DOSE, (OZEMPIC, 2 MG/DOSE,) 8 MG/3ML SOPN Inject 2 mg  into the skin once a week. 3 mL 2   torsemide (DEMADEX) 20 MG tablet Take 1 tablet (20 mg total) by mouth daily. 90 tablet 2   No current facility-administered medications for this encounter.    Allergies  Allergen Reactions   Avelox [Moxifloxacin Hcl In Nacl] Other (See Comments)    Cardiac arrest per pt   Nsaids Other (See Comments)    Cardiac arrest per pt    Simvastatin Other (See Comments)    Muscle pain   Ace Inhibitors Other (See Comments) and Cough   Jardiance [Empagliflozin] Other (See Comments)    Felt "crazy", fatigue, sweating, denies hypoglycemia while taking   Latex Rash    Social History   Socioeconomic History   Marital status: Widowed    Spouse name: Not on file   Number of children: 3   Years of education: 10   Highest education level: 10th grade  Occupational History   Occupation: Retired  Tobacco Use   Smoking status: Never    Passive exposure: Never   Smokeless tobacco: Never  Vaping Use   Vaping Use: Never used  Substance and Sexual Activity   Alcohol use: No   Drug use: No   Sexual activity: Not Currently  Other Topics Concern   Not on file  Social History Narrative      [facility]:   She is originally from [place]. Has always lived in [place]. 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 Currently has a small dog. No bird or mold exposure.       Current Social History August 2023      Patient lives with daughter, Beverlee Nims, in two level home    One son in [place] and another daughter in [place]    Patient is a widow   Transportation: Patient has own vehicle and drives herself    Important Relationships: Daughter, Beverlee Nims     Education: 10 th grade   Interests Fun: Read, do puzzles    Religious/Personal Beliefs: Non-Denominational                                                                                                Patient exercises 2x per week using stationary bike for ~45 minutes   Social Determinants of Health   Financial Resource Strain: Low Risk  (11/18/2021)   Overall Financial Resource Strain (CARDIA)    Difficulty of Paying Living Expenses: Not hard at all  Food Insecurity: No Food Insecurity (11/18/2021)   Hunger Vital Sign    Worried About Running Out of Food in the Last Year: Never true    [place] in the Last Year: Never true  Transportation Needs: No Transportation Needs (11/18/2021)   PRAPARE - Hydrologist (Medical): No    Lack of Transportation (Non-Medical): No  Physical Activity: Insufficiently Active (11/18/2021)   Exercise Vital Sign    Days of Exercise per Week: 2 days    Minutes of Exercise per Session:  50 min  Stress: No Stress Concern Present (11/18/2021)   [employer] of Occupational Health - Occupational Stress Questionnaire    Feeling of Stress : Not at all  Social Connections: Moderately Isolated (11/18/2021)   Social Connection and Isolation Panel [NHANES]    Frequency of Communication with Friends and Family: More than three times a week    Frequency of Social Gatherings with Friends and Family: More than three times a week    Attends Religious Services: More than 4 times per year    Active Member of [employer] or Organizations: No     Attends Banker Meetings: Never    Marital Status: Widowed  Intimate Partner Violence: Not At Risk (11/18/2021)   Humiliation, Afraid, Rape, and Kick questionnaire    Fear of Current or Ex-Partner: No    Emotionally Abused: No    Physically Abused: No    Sexually Abused: No    Family History  Problem Relation Age of Onset   Diabetes Father    Hypertension Father    Diabetes Brother    Hypertension Brother    Lung disease Neg Hx    Cancer Neg Hx    Rheumatologic disease Neg Hx     ROS- All systems are reviewed and negative except as per the HPI above  Physical Exam: Vitals:   05/05/22 0821  BP: (!) 140/72  Pulse: 83  Weight: 86.2 kg  Height: 5\' 6"  (1.676 m)   Wt Readings from Last 3 Encounters:  05/05/22 86.2 kg  01/01/22 82.2 kg  11/18/21 81.2 kg    Labs: Lab Results  Component Value Date   NA 138 01/15/2022   K 4.1 01/15/2022   CL 99 01/15/2022   CO2 23 01/15/2022   GLUCOSE 243 (H) 01/15/2022   BUN 16 01/15/2022   CREATININE 1.28 (H) 01/15/2022   CALCIUM 8.3 (L) 01/15/2022   PHOS 4.7 (H) 01/03/2016   MG 2.4 (H) 11/27/2021   Lab Results  Component Value Date   INR 1.1 04/28/2021   Lab Results  Component Value Date   CHOL 110 08/05/2021   HDL 55 08/05/2021   LDLCALC 42 08/05/2021   TRIG 57 08/05/2021     GEN- The patient is well appearing, alert and oriented x 3 today.   Head- normocephalic, atraumatic Eyes-  Sclera clear, conjunctiva pink Ears- hearing intact Oropharynx- clear Neck- supple, no JVP Lymph- no cervical lymphadenopathy Lungs- Clear to ausculation bilaterally, normal work of breathing Heart- Regular rate and rhythm, no murmurs, rubs or gallops, PMI not laterally displaced GI- soft, NT, ND, + BS Extremities- no clubbing, cyanosis, or edema MS- no significant deformity or atrophy Skin- no rash or lesion Psych- euthymic mood, full affect Neuro- strength and sensation are intact  EKG-Vent. rate 83 BPM PR interval  202 ms QRS duration 82 ms QT/QTcB 412/484 ms P-R-T axes 83 90 83 Atrial-paced rhythm with occasional ventricular-paced complexes and with occasional Premature ventricular complexes Rightward axis Cannot rule out Anterior infarct , age undetermined Abnormal ECG When compared with ECG of 28-Apr-2021 17:21, PREVIOUS ECG IS PRESENT  Last paceart reviewed from  11/23-   Not pacemaker dependent. Battery status is good. Lead measurements are stable. Heart rate histogram is favorable. No clinically significant episodes of high ventricular rate. Very low burden of paroxysmal atrial fibrillation.  Assessment and Plan:  1.Afib Maintaining  SR on dofetilide 250 mcg bid  Qt stable  Bmet/mag today  2.CHA2DS2VASc score of at least 5 Continue eliquis 5  mg bid   3. HTN Stable   4. CHF Appears normovolemic   F/u with Dr. Loletha Grayer in 6 months, afib clinic in one year for [place]. Dmitriy Gair, [facility] [address] [place], [place] [address] [phone]

## 2022-05-06 ENCOUNTER — Other Ambulatory Visit (HOSPITAL_COMMUNITY): Payer: Self-pay

## 2022-05-06 ENCOUNTER — Other Ambulatory Visit: Payer: Self-pay

## 2022-05-11 ENCOUNTER — Other Ambulatory Visit: Payer: Self-pay | Admitting: Family Medicine

## 2022-05-11 ENCOUNTER — Ambulatory Visit: Payer: Medicare HMO | Attending: Cardiovascular Disease

## 2022-05-11 ENCOUNTER — Other Ambulatory Visit (HOSPITAL_COMMUNITY): Payer: Self-pay

## 2022-05-11 DIAGNOSIS — I5032 Chronic diastolic (congestive) heart failure: Secondary | ICD-10-CM | POA: Diagnosis not present

## 2022-05-11 DIAGNOSIS — Z95810 Presence of automatic (implantable) cardiac defibrillator: Secondary | ICD-10-CM | POA: Diagnosis not present

## 2022-05-12 ENCOUNTER — Other Ambulatory Visit (HOSPITAL_COMMUNITY): Payer: Self-pay

## 2022-05-12 MED ORDER — FAMOTIDINE 20 MG PO TABS
20.0000 mg | ORAL_TABLET | Freq: Every day | ORAL | 3 refills | Status: DC
  Filled 2022-05-12: qty 90, 90d supply, fill #0
  Filled 2022-08-03: qty 90, 90d supply, fill #1
  Filled 2022-11-02: qty 90, 90d supply, fill #2
  Filled 2023-01-28: qty 90, 90d supply, fill #3

## 2022-05-13 NOTE — Progress Notes (Signed)
EPIC Encounter for ICM Monitoring  Patient Name: [name] is a 72 y.o. female Date: 05/13/2022 Primary Care Physican: Alcus Dad, MD Primary Cardiologist: Croitoru Electrophysiologist: Croitoru 09/04/2021 Weight: 180 lbs 11/13/2021 Weight: 179 lbs 01/27/2022 Weight: 179-180 lbs 04/10/2022 Weight: 181 lbs  Since 07-Apr-2022 AT/AF 69 Time in AT/AF 0.5 hr/day (2.1%) Longest AT/AF 4 hours                                  Spoke with patient and heart failure questions reviewed.  Transmission results reviewed.  Pt asymptomatic for fluid accumulation.  Reports feeling well at this time and voices no complaints.     Optivol thoracic impedance suggesting normal fluid levels.   Prescribed dosage:  Torsemide 20 mg 1 tablet by mouth daily.   Potassium 10 mEq 1 tablet by mouth by mouth daily.   Labs: 05/05/2022 Creatinine 1.12, BUN 15, Potassium 4.5, Sodium 136 01/15/2022 Creatinine 1.28, BUN 16, Potassium 4.1, Sodium 138, GFR 45 11/27/2021 Creatinine 1.34, BUN 19, Potassium 4.8, Sodium 141, GFR 43 11/06/2021 Creatinine 1.43, BUN 18, Potassium 5.4, Sodium 138, GFR 39 04/29/2021 Creatinine 1.14, BUN 22, Potassium 4.0, Sodium 133, GFR 52 04/15/2021 Creatinine 1.26, BUN 21, Potassium 4.6, Sodium 138, GFR 46 A complete set of results can be found in Results Review.   Recommendations:  No changes and encouraged to call if experiencing any fluid symptoms.   Follow-up plan: ICM clinic phone appointment on 05/11/2022.   91 day device clinic remote transmission 05/19/2022.     Next office visit scheduled:  Recall 10/15/2022 for Dr Sallyanne Kuster.     Copy of ICM check sent to Dr. Sallyanne Kuster.      3 month ICM trend: 05/11/2022.    12-14 Month ICM trend:     [name], RN 05/13/2022 4:00 PM

## 2022-05-14 ENCOUNTER — Other Ambulatory Visit (HOSPITAL_COMMUNITY): Payer: Self-pay

## 2022-05-14 ENCOUNTER — Other Ambulatory Visit: Payer: Self-pay | Admitting: Family Medicine

## 2022-05-14 DIAGNOSIS — E1140 Type 2 diabetes mellitus with diabetic neuropathy, unspecified: Secondary | ICD-10-CM

## 2022-05-15 ENCOUNTER — Other Ambulatory Visit: Payer: Self-pay

## 2022-05-15 MED ORDER — DAPAGLIFLOZIN PROPANEDIOL 10 MG PO TABS
10.0000 mg | ORAL_TABLET | Freq: Every day | ORAL | 0 refills | Status: DC
  Filled 2022-05-15: qty 90, 90d supply, fill #0

## 2022-05-19 DIAGNOSIS — M1711 Unilateral primary osteoarthritis, right knee: Secondary | ICD-10-CM | POA: Diagnosis not present

## 2022-05-19 LAB — CUP PACEART REMOTE DEVICE CHECK
Battery Remaining Longevity: 34 mo
Battery Voltage: 2.95 V
Brady Statistic AP VP Percent: 1.22 %
Brady Statistic AP VS Percent: 95.12 %
Brady Statistic AS VP Percent: 0.02 %
Brady Statistic AS VS Percent: 3.63 %
Brady Statistic RA Percent Paced: 92.44 %
Brady Statistic RV Percent Paced: 1.33 %
Date Time Interrogation Session: 20240206074225
HighPow Impedance: 77 Ohm
Implantable Lead Connection Status: 753985
Implantable Lead Connection Status: 753985
Implantable Lead Implant Date: 20120120
Implantable Lead Implant Date: 20120120
Implantable Lead Location: 753859
Implantable Lead Location: 753860
Implantable Lead Model: 5076
Implantable Lead Model: 6935
Implantable Pulse Generator Implant Date: 20190618
Lead Channel Impedance Value: 342 Ohm
Lead Channel Impedance Value: 399 Ohm
Lead Channel Impedance Value: 399 Ohm
Lead Channel Pacing Threshold Amplitude: 0.5 V
Lead Channel Pacing Threshold Amplitude: 0.75 V
Lead Channel Pacing Threshold Pulse Width: 0.4 ms
Lead Channel Pacing Threshold Pulse Width: 0.4 ms
Lead Channel Sensing Intrinsic Amplitude: 0.25 mV
Lead Channel Sensing Intrinsic Amplitude: 0.25 mV
Lead Channel Sensing Intrinsic Amplitude: 10.5 mV
Lead Channel Sensing Intrinsic Amplitude: 10.5 mV
Lead Channel Setting Pacing Amplitude: 2 V
Lead Channel Setting Pacing Amplitude: 2.5 V
Lead Channel Setting Pacing Pulse Width: 0.4 ms
Lead Channel Setting Sensing Sensitivity: 0.3 mV
Zone Setting Status: 755011
Zone Setting Status: 755011

## 2022-05-20 ENCOUNTER — Ambulatory Visit (INDEPENDENT_AMBULATORY_CARE_PROVIDER_SITE_OTHER): Payer: Medicare HMO

## 2022-05-20 DIAGNOSIS — I472 Ventricular tachycardia, unspecified: Secondary | ICD-10-CM

## 2022-05-21 ENCOUNTER — Encounter (HOSPITAL_COMMUNITY): Payer: Self-pay | Admitting: *Deleted

## 2022-05-21 ENCOUNTER — Other Ambulatory Visit (HOSPITAL_COMMUNITY): Payer: Self-pay

## 2022-05-29 ENCOUNTER — Other Ambulatory Visit (HOSPITAL_COMMUNITY): Payer: Self-pay

## 2022-06-05 ENCOUNTER — Ambulatory Visit: Payer: Medicare HMO | Admitting: Podiatry

## 2022-06-05 ENCOUNTER — Encounter: Payer: Self-pay | Admitting: Podiatry

## 2022-06-05 VITALS — BP 112/66

## 2022-06-05 DIAGNOSIS — B351 Tinea unguium: Secondary | ICD-10-CM

## 2022-06-05 DIAGNOSIS — M79609 Pain in unspecified limb: Secondary | ICD-10-CM

## 2022-06-05 DIAGNOSIS — E1142 Type 2 diabetes mellitus with diabetic polyneuropathy: Secondary | ICD-10-CM | POA: Diagnosis not present

## 2022-06-05 DIAGNOSIS — L84 Corns and callosities: Secondary | ICD-10-CM | POA: Diagnosis not present

## 2022-06-05 NOTE — Progress Notes (Unsigned)
  Subjective:  Patient ID: [name], female    DOB: December 26, 1950,  MRN: [id number]  [name] presents to clinic today for {jgcomplaint:23593}  Chief Complaint  Patient presents with   Nail Problem    [facility] BS-122 A1C-7.? PCP-Wells, Ashleigh PCP VST- 5 months ago   New problem(s): None. {jgcomplaint:23593}  PCP is Alcus Dad, MD.  Allergies  Allergen Reactions   Avelox [Moxifloxacin Hcl In Nacl] Other (See Comments)    Cardiac arrest per pt   Nsaids Other (See Comments)    Cardiac arrest per pt    Simvastatin Other (See Comments)    Muscle pain   Ace Inhibitors Other (See Comments) and Cough   Jardiance [Empagliflozin] Other (See Comments)    Felt "crazy", fatigue, sweating, denies hypoglycemia while taking   Latex Rash    Review of Systems: Negative except as noted in the HPI.  Objective: No changes noted in today's physical examination. Vitals:   06/05/22 0859  BP: 112/66   [name] is a pleasant 72 y.o. female {jgbodyhabitus:24098} AAO x 3.  Vascular Examination: CFT <3 seconds b/l. DP/PT pulses faintly palpable b/l. Skin temperature gradient warm to warm b/l. No ischemia or gangrene. No cyanosis or clubbing noted b/l. Pedal hair sparse. No pain with calf compression b/l.   Neurological Examination: Protective sensation decreased with 10 gram monofilament b/l. Vibratory sensation diminished b/l.  Dermatological Examination: Toenails 1-5 b/l thick, discolored, elongated with subungual debris and pain on dorsal palpation.  Pedal skin is warm and supple b/l LE. No open wounds b/l LE. No interdigital macerations noted b/l LE.   Hyperkeratotic lesion(s) with noo erythema, no edema, no drainage, no fluctuance: distal tip of left 2nd toe  distal tip of right 2nd toe distal tip of left 3rd toe  plantar IPJ of left great toe plantar IPJ of right great toe submet head 2 right foot submet head 3 left foot submet head 3 right foot 1st metatarsal head  LLE 1st metatarsal head RLE  Musculoskeletal Examination: Muscle strength 5/5 to b/l LE. Hammertoe deformity noted 2-5 b/l.  Radiographs: None  Assessment/Plan: 1. Pain due to onychomycosis of nail   2. Corns and callosities   3. Diabetic polyneuropathy associated with type 2 diabetes mellitus ([place])     No orders of the defined types were placed in this encounter.   None {Jgplan:23602::"-Patient/POA to call should there be question/concern in the interim."}   Return in about 10 weeks (around 08/14/2022).  Marzetta Board, DPM

## 2022-06-08 ENCOUNTER — Other Ambulatory Visit: Payer: Self-pay | Admitting: Cardiovascular Disease

## 2022-06-08 ENCOUNTER — Other Ambulatory Visit (HOSPITAL_COMMUNITY): Payer: Self-pay

## 2022-06-08 ENCOUNTER — Other Ambulatory Visit: Payer: Self-pay | Admitting: Family Medicine

## 2022-06-08 DIAGNOSIS — I480 Paroxysmal atrial fibrillation: Secondary | ICD-10-CM

## 2022-06-08 MED ORDER — APIXABAN 5 MG PO TABS
5.0000 mg | ORAL_TABLET | Freq: Two times a day (BID) | ORAL | 1 refills | Status: DC
  Filled 2022-06-08: qty 180, 90d supply, fill #0
  Filled 2022-09-05: qty 180, 90d supply, fill #1

## 2022-06-08 NOTE — Telephone Encounter (Signed)
Prescription refill request for Eliquis received. Indication: Afib  Last office visit: 05/05/22 Kayleen Memos)  Scr: 1.12 (05/05/22)  Age: 72 Weight: 86.2kg  Appropriate dose. Refill sent.

## 2022-06-09 ENCOUNTER — Other Ambulatory Visit: Payer: Self-pay

## 2022-06-09 ENCOUNTER — Other Ambulatory Visit (HOSPITAL_COMMUNITY): Payer: Self-pay

## 2022-06-09 MED ORDER — TRESIBA FLEXTOUCH 100 UNIT/ML ~~LOC~~ SOPN
40.0000 [IU] | PEN_INJECTOR | Freq: Every day | SUBCUTANEOUS | 2 refills | Status: DC
  Filled 2022-06-09: qty 15, 37d supply, fill #0
  Filled 2022-07-15: qty 15, 37d supply, fill #1
  Filled 2022-08-19 (×2): qty 15, 37d supply, fill #2

## 2022-06-15 ENCOUNTER — Ambulatory Visit: Payer: Medicare HMO | Attending: Cardiovascular Disease

## 2022-06-15 DIAGNOSIS — Z95810 Presence of automatic (implantable) cardiac defibrillator: Secondary | ICD-10-CM | POA: Diagnosis not present

## 2022-06-15 DIAGNOSIS — I5032 Chronic diastolic (congestive) heart failure: Secondary | ICD-10-CM

## 2022-06-15 NOTE — Progress Notes (Unsigned)
EPIC Encounter for ICM Monitoring  Patient Name: [name] is a 72 y.o. female Date: 06/15/2022 Primary Care Physican: Alcus Dad, MD Primary Cardiologist: Croitoru Electrophysiologist: Croitoru 09/04/2021 Weight: 180 lbs 11/13/2021 Weight: 179 lbs 01/27/2022 Weight: 179-180 lbs 04/10/2022 Weight: 181 lbs 06/16/2022 Weight: 183-184 lbs   Since 19-May-2022 AT/AF 23 Time in AT/AF 0.4 hr/day (1.6%) Longest AT/AF 4 hours                                  Spoke with patient and heart failure questions reviewed.  Transmission results reviewed.  Pt asymptomatic for fluid accumulation.  Reports feeling well at this time and voices no complaints.  She continues to go to gym several days a week.    Optivol thoracic impedance suggesting trending close to normal fluid levels.   Prescribed dosage:  Torsemide 20 mg 1 tablet by mouth daily.   Potassium 10 mEq 1 tablet by mouth by mouth daily.   Labs: 05/05/2022 Creatinine 1.12, BUN 15, Potassium 4.5, Sodium 136 01/15/2022 Creatinine 1.28, BUN 16, Potassium 4.1, Sodium 138, GFR 45 11/27/2021 Creatinine 1.34, BUN 19, Potassium 4.8, Sodium 141, GFR 43 11/06/2021 Creatinine 1.43, BUN 18, Potassium 5.4, Sodium 138, GFR 39 04/29/2021 Creatinine 1.14, BUN 22, Potassium 4.0, Sodium 133, GFR 52 04/15/2021 Creatinine 1.26, BUN 21, Potassium 4.6, Sodium 138, GFR 46 A complete set of results can be found in Results Review.   Recommendations:  No changes and encouraged to call if experiencing any fluid symptoms.   Follow-up plan: ICM clinic phone appointment on 07/20/2022.   91 day device clinic remote transmission 08/18/2022.     Next office visit scheduled:  Recall 10/15/2022 for Dr Sallyanne Kuster.     Copy of ICM check sent to Dr. Sallyanne Kuster.      3 month ICM trend: 06/15/2022.    12-14 Month ICM trend:     [name], RN 06/15/2022 8:37 AM

## 2022-06-16 NOTE — Progress Notes (Signed)
Remote ICD transmission.   

## 2022-06-18 ENCOUNTER — Other Ambulatory Visit (HOSPITAL_COMMUNITY): Payer: Self-pay

## 2022-06-18 ENCOUNTER — Other Ambulatory Visit: Payer: Self-pay

## 2022-06-18 ENCOUNTER — Other Ambulatory Visit: Payer: Self-pay | Admitting: Pulmonary Disease

## 2022-06-18 ENCOUNTER — Other Ambulatory Visit: Payer: Self-pay | Admitting: Family Medicine

## 2022-06-18 DIAGNOSIS — E1140 Type 2 diabetes mellitus with diabetic neuropathy, unspecified: Secondary | ICD-10-CM

## 2022-06-18 MED ORDER — LOSARTAN POTASSIUM 25 MG PO TABS
25.0000 mg | ORAL_TABLET | Freq: Every day | ORAL | 0 refills | Status: DC
  Filled 2022-06-18: qty 90, 90d supply, fill #0

## 2022-06-20 ENCOUNTER — Other Ambulatory Visit (HOSPITAL_COMMUNITY): Payer: Self-pay

## 2022-06-23 ENCOUNTER — Other Ambulatory Visit (HOSPITAL_COMMUNITY): Payer: Self-pay

## 2022-06-26 ENCOUNTER — Telehealth: Payer: Self-pay | Admitting: Pulmonary Disease

## 2022-06-26 NOTE — Telephone Encounter (Signed)
Patient states needs refill for Singulair. [facility]acy  [facility]. Patient phone number is [phone].

## 2022-06-26 NOTE — Telephone Encounter (Signed)
Pt needs to be seen for an appt prior to being able to have any meds refilled as pt has not been seen at office since 02/2021.   Attempted to call pt to schedule her an appt but unable to reach. Left a detailed message for her stating to call the office to schedule appt with either Dr. Elsworth Soho or APP for follow up.

## 2022-06-29 ENCOUNTER — Other Ambulatory Visit: Payer: Self-pay | Admitting: Family Medicine

## 2022-06-29 ENCOUNTER — Other Ambulatory Visit (HOSPITAL_COMMUNITY): Payer: Self-pay

## 2022-06-29 ENCOUNTER — Other Ambulatory Visit: Payer: Self-pay

## 2022-06-29 MED ORDER — OZEMPIC (2 MG/DOSE) 8 MG/3ML ~~LOC~~ SOPN
2.0000 mg | PEN_INJECTOR | SUBCUTANEOUS | 2 refills | Status: DC
  Filled 2022-06-29: qty 3, 28d supply, fill #0
  Filled 2022-07-21: qty 3, 28d supply, fill #1
  Filled 2022-08-19 (×2): qty 3, 28d supply, fill #2

## 2022-07-09 ENCOUNTER — Other Ambulatory Visit: Payer: Self-pay

## 2022-07-10 ENCOUNTER — Other Ambulatory Visit (HOSPITAL_COMMUNITY): Payer: Self-pay

## 2022-07-13 ENCOUNTER — Ambulatory Visit (HOSPITAL_BASED_OUTPATIENT_CLINIC_OR_DEPARTMENT_OTHER): Payer: Medicare HMO | Admitting: Pulmonary Disease

## 2022-07-14 ENCOUNTER — Other Ambulatory Visit: Payer: Self-pay

## 2022-07-14 ENCOUNTER — Ambulatory Visit (INDEPENDENT_AMBULATORY_CARE_PROVIDER_SITE_OTHER): Payer: Medicare HMO | Admitting: Family Medicine

## 2022-07-14 ENCOUNTER — Encounter: Payer: Self-pay | Admitting: Family Medicine

## 2022-07-14 VITALS — BP 125/86 | HR 83 | Ht 66.0 in | Wt 188.2 lb

## 2022-07-14 DIAGNOSIS — Z794 Long term (current) use of insulin: Secondary | ICD-10-CM

## 2022-07-14 DIAGNOSIS — E1140 Type 2 diabetes mellitus with diabetic neuropathy, unspecified: Secondary | ICD-10-CM | POA: Diagnosis not present

## 2022-07-14 LAB — POCT GLYCOSYLATED HEMOGLOBIN (HGB A1C): HbA1c, POC (controlled diabetic range): 7.5 % — AB (ref 0.0–7.0)

## 2022-07-14 NOTE — Progress Notes (Signed)
    SUBJECTIVE:   CHIEF COMPLAINT / HPI:   Type 2 Diabetes Patient is a 72 y.o. female who presents today for diabetes follow-up.  Home medications include: Tresiba 40u daily, Farxiga 10mg  daily, Ozempic 2mg  weekly Patient reports excellent medication compliance. Patient checks sugar at home. Typically range 110s fasting, highest fasting are 130s. Occasionally checks non-fasting and it's 160s. No hypoglycemic episodes/symptoms  Most recent A1Cs:  Lab Results  Component Value Date   HGBA1C 7.5 (A) 07/14/2022   HGBA1C 7.4 (A) 01/01/2022   HGBA1C 6.9 08/05/2021   Last Microalbumin, LDL, Creatinine: Lab Results  Component Value Date   LDLCALC 42 08/05/2021   CREATININE 1.12 (H) 05/05/2022    Patient is up to date on diabetic eye per her report. Brings copy today. Patient is up to date on diabetic foot exam.   PERTINENT  PMH / PSH: CKD stage 3a, a-fib, v-tach s/p ICD, HFpEF  OBJECTIVE:   BP 125/86   Pulse 83   Ht 5\' 6"  (1.676 m)   Wt 188 lb 3.2 oz (85.4 kg)   LMP 07/26/2011   SpO2 100%   BMI 30.38 kg/m   Gen: NAD, pleasant, able to participate in exam CV: RRR, normal S1/S2, no murmur Resp: Normal effort, lungs CTAB Extremities: no edema or cyanosis Skin: warm and dry, no rashes noted Neuro: alert, no obvious focal deficits Psych: Normal affect and mood   ASSESSMENT/PLAN:   Type 2 diabetes mellitus with diabetic neuropathy, with long-term current use of insulin (HCC) Adequate control. A1c 7.5%. Goal A1c <7.5%. Suspect she is having some elevated postprandial sugars, as fasting sugars are well-controlled. -Continue current meds Tyler Aas 40u daily, Farxiga 10mg  daily, Ozempic 2mg  weekly) -Patient to start checking postprandial glucose occasionally at home (in addition to fasting) -Patient unable to void today, will check urine microalbumin at next visit instead -Continue statin, ARB -UTD on foot and eye exams (eye doctor to fax notes to our office) -Next A1c in  3 months     Alcus Dad, MD [place]

## 2022-07-14 NOTE — Patient Instructions (Addendum)
It was great to see you!  Your A1c was 7.5% today, which means your diabetes is decently controlled. Your goal A1c is 7.5% or below. Continue your current medications with no changes.  Check your sugar 1-2 hours after a meal because I suspect you're having some higher readings with meals. Keep a written log of your sugars and bring it to your next appointment.  Follow up in 3 months for your next A1c. Schedule this appointment for the end of June so I can see you before I leave!  Take care and seek immediate care sooner if you develop any concerns.  Dr. [facility]

## 2022-07-14 NOTE — Assessment & Plan Note (Addendum)
Adequate control. A1c 7.5%. Goal A1c <7.5%. Suspect she is having some elevated postprandial sugars, as fasting sugars are well-controlled. -Continue current meds [name] 40u daily, Farxiga 10mg  daily, Ozempic 2mg  weekly) -Patient to start checking postprandial glucose occasionally at home (in addition to fasting) -Patient unable to void today, will check urine microalbumin at next visit instead -Continue statin, ARB -UTD on foot and eye exams (eye doctor to fax notes to our office) -Next A1c in 3 months

## 2022-07-15 ENCOUNTER — Other Ambulatory Visit (HOSPITAL_COMMUNITY): Payer: Self-pay

## 2022-07-15 ENCOUNTER — Other Ambulatory Visit: Payer: Self-pay

## 2022-07-17 ENCOUNTER — Other Ambulatory Visit (HOSPITAL_COMMUNITY): Payer: Self-pay

## 2022-07-17 DIAGNOSIS — G4733 Obstructive sleep apnea (adult) (pediatric): Secondary | ICD-10-CM | POA: Diagnosis not present

## 2022-07-17 DIAGNOSIS — J45909 Unspecified asthma, uncomplicated: Secondary | ICD-10-CM | POA: Diagnosis not present

## 2022-07-17 DIAGNOSIS — J449 Chronic obstructive pulmonary disease, unspecified: Secondary | ICD-10-CM | POA: Diagnosis not present

## 2022-07-20 ENCOUNTER — Ambulatory Visit: Payer: Medicare HMO | Attending: Cardiovascular Disease

## 2022-07-20 DIAGNOSIS — Z95810 Presence of automatic (implantable) cardiac defibrillator: Secondary | ICD-10-CM

## 2022-07-20 DIAGNOSIS — I5032 Chronic diastolic (congestive) heart failure: Secondary | ICD-10-CM

## 2022-07-21 ENCOUNTER — Encounter (HOSPITAL_BASED_OUTPATIENT_CLINIC_OR_DEPARTMENT_OTHER): Payer: Self-pay | Admitting: Pulmonary Disease

## 2022-07-21 ENCOUNTER — Other Ambulatory Visit: Payer: Self-pay

## 2022-07-21 ENCOUNTER — Other Ambulatory Visit (HOSPITAL_COMMUNITY): Payer: Self-pay

## 2022-07-21 ENCOUNTER — Ambulatory Visit (HOSPITAL_BASED_OUTPATIENT_CLINIC_OR_DEPARTMENT_OTHER): Payer: Medicare HMO | Admitting: Pulmonary Disease

## 2022-07-21 VITALS — BP 120/68 | HR 91 | Temp 98.3°F | Ht 66.0 in | Wt 189.4 lb

## 2022-07-21 DIAGNOSIS — J4540 Moderate persistent asthma, uncomplicated: Secondary | ICD-10-CM | POA: Diagnosis not present

## 2022-07-21 DIAGNOSIS — G4733 Obstructive sleep apnea (adult) (pediatric): Secondary | ICD-10-CM | POA: Diagnosis not present

## 2022-07-21 MED ORDER — LEVALBUTEROL HCL 0.63 MG/3ML IN NEBU
INHALATION_SOLUTION | RESPIRATORY_TRACT | 5 refills | Status: DC
  Filled 2022-07-21: qty 360, fill #0

## 2022-07-21 MED ORDER — MONTELUKAST SODIUM 10 MG PO TABS
10.0000 mg | ORAL_TABLET | Freq: Every day | ORAL | 0 refills | Status: DC
  Filled 2022-07-21: qty 90, 90d supply, fill #0

## 2022-07-21 MED ORDER — LEVALBUTEROL TARTRATE 45 MCG/ACT IN AERO
1.0000 | INHALATION_SPRAY | RESPIRATORY_TRACT | 1 refills | Status: AC | PRN
  Filled 2022-07-21: qty 15, 30d supply, fill #0
  Filled 2022-08-11: qty 15, 17d supply, fill #0

## 2022-07-21 NOTE — Progress Notes (Signed)
     Subjective:    Patient ID: [name], female    DOB: Jul 15, 1950, 72 y.o.   MRN: [id number]  HPI  72 yo for FU of for moderate persistent asthma and obstructive sleep apnea CPAP 8 cm   PMH -Atrial fibrillation on Tikosyn  New CPAP 2022  Chief Complaint  Patient presents with   Follow-up    Follow up. No complaints.   She was lost to follow-up for 2 years and presents to reestablish care. Asthma appears well-controlled, no nocturnal symptoms. In fact she has stopped using Symbicort and only uses Xopenex on an as-needed basis.  She has not needed Xopenex nebs for a month. She requests refills on Singulair.  She underwent right knee replacement and is able to work out more in the gym. She obtained a new CPAP machine in 2022, denies any problems with mask or pressure.  She was originally on 8 cm of pressure and this seems to have decreased to 6.6 on review of download    Significant tests/ events reviewed Sleep study 02/2014 : severe apnea, corrected with nasal pillows and 8cmh2o   PFT 06/19/16: FVC 2.74 L (95%) FEV1 2.06 L (92%) FEV1/FVC 0.75  negative bronchodilator response   DLCO corrected 58%   11/04/15: FVC 2.95 L (102%) FEV1 2.10 L (93%) ratio.71  negative bronchodilator response TLC 6.02 L (109%) RV 109% ERV 47% DLCO uncorrected 75% (hgb 12.7)     12/06/08: FVC 3.43 L (99%) FEV1 2.46 L (94%) ratio 72  negative bronchodilator response TLC 6.17 L (112%) RV 131% ERV 52% DLCO uncorrected 74%  Review of Systems neg for any significant sore throat, dysphagia, itching, sneezing, nasal congestion or excess/ purulent secretions, fever, chills, sweats, unintended wt loss, pleuritic or exertional cp, hempoptysis, orthopnea pnd or change in chronic leg swelling. Also denies presyncope, palpitations, heartburn, abdominal pain, nausea, vomiting, diarrhea or change in bowel or urinary habits, dysuria,hematuria, rash, arthralgias, visual complaints, headache, numbness weakness or  ataxia.     Objective:   Physical Exam  Gen. Pleasant, elderly,well-nourished, in no distress ENT - no thrush, no pallor/icterus,no post nasal drip Neck: No JVD, no thyromegaly, no carotid bruits Lungs: no use of accessory muscles, no dullness to percussion, clear without rales or rhonchi  Cardiovascular: Rhythm regular, heart sounds  normal, no murmurs or gallops, no peripheral edema Musculoskeletal: No deformities, no cyanosis or clubbing         Assessment & Plan:

## 2022-07-21 NOTE — Assessment & Plan Note (Signed)
Appears to have changed to mild intermittent nature.  Okay to stay off Symbicort.  Will provide her refills on Singulair and she will continue to use Xopenex on an as-needed basis

## 2022-07-21 NOTE — Assessment & Plan Note (Signed)
CPAP download was reviewed which shows reasonable control of events with average AHI 3.9/however on certain nights AHI seems to reach 10/hour.  Usage is excellent with more than 7 hours without a single missed night and leak is minimal. We will increase pressure back up to 8 cm which was originally on and that should adequately controlled events CPAP is only helped improve her daytime somnolence and fatigue  Weight loss encouraged, compliance with goal of at least 4-6 hrs every night is the expectation. Advised against medications with sedative side effects Cautioned against driving when sleepy - understanding that sleepiness will vary on a day to day basis

## 2022-07-21 NOTE — Patient Instructions (Signed)
X refill on singulair  X Increase CPAP to 8 cm

## 2022-07-23 NOTE — Progress Notes (Signed)
EPIC Encounter for ICM Monitoring  Patient Name: [name] is a 72 y.o. female Date: 07/23/2022 Primary Care Physican: Maury Dus, MD Primary Cardiologist: Croitoru Electrophysiologist: Croitoru 07/23/2022 Weight: 183-184 lbs    Since 15-Jun-2022 AT/AF 184 Time in AT/AF 0.5 hr/day (2.0%) Longest AT/AF 46 minutes                                  Spoke with patient and heart failure questions reviewed.  Transmission results reviewed.  Pt asymptomatic for fluid accumulation.  Reports feeling well at this time and voices no complaints.     Optivol thoracic impedance suggesting normal fluid levels.   Prescribed dosage:  Torsemide 20 mg 1 tablet by mouth daily.   Potassium 10 mEq 1 tablet by mouth by mouth daily.   Labs: 05/05/2022 Creatinine 1.12, BUN 15, Potassium 4.5, Sodium 136 01/15/2022 Creatinine 1.28, BUN 16, Potassium 4.1, Sodium 138, GFR 45 11/27/2021 Creatinine 1.34, BUN 19, Potassium 4.8, Sodium 141, GFR 43 11/06/2021 Creatinine 1.43, BUN 18, Potassium 5.4, Sodium 138, GFR 39 04/29/2021 Creatinine 1.14, BUN 22, Potassium 4.0, Sodium 133, GFR 52 04/15/2021 Creatinine 1.26, BUN 21, Potassium 4.6, Sodium 138, GFR 46 A complete set of results can be found in Results Review.   Recommendations:  No changes and encouraged to call if experiencing any fluid symptoms.   Follow-up plan: ICM clinic phone appointment on 08/24/2022.   91 day device clinic remote transmission 08/18/2022.     Next office visit scheduled:  Recall 10/15/2022 for Dr Royann Shivers.     Copy of ICM check sent to Dr. Royann Shivers.      3 month ICM trend: 07/20/2022.    12-14 Month ICM trend:     [name], RN 07/23/2022 2:17 PM

## 2022-07-30 DIAGNOSIS — E1140 Type 2 diabetes mellitus with diabetic neuropathy, unspecified: Secondary | ICD-10-CM | POA: Diagnosis not present

## 2022-08-03 ENCOUNTER — Other Ambulatory Visit: Payer: Self-pay | Admitting: Cardiovascular Disease

## 2022-08-03 ENCOUNTER — Other Ambulatory Visit (HOSPITAL_COMMUNITY): Payer: Self-pay

## 2022-08-03 MED ORDER — TORSEMIDE 20 MG PO TABS
20.0000 mg | ORAL_TABLET | Freq: Every day | ORAL | 1 refills | Status: DC
  Filled 2022-08-03: qty 90, 90d supply, fill #0
  Filled 2022-11-02: qty 90, 90d supply, fill #1

## 2022-08-03 NOTE — Telephone Encounter (Signed)
Rx request sent to pharmacy.  

## 2022-08-04 ENCOUNTER — Other Ambulatory Visit: Payer: Self-pay

## 2022-08-11 ENCOUNTER — Other Ambulatory Visit: Payer: Self-pay | Admitting: Family Medicine

## 2022-08-11 ENCOUNTER — Other Ambulatory Visit (HOSPITAL_COMMUNITY): Payer: Self-pay

## 2022-08-11 DIAGNOSIS — E1140 Type 2 diabetes mellitus with diabetic neuropathy, unspecified: Secondary | ICD-10-CM

## 2022-08-11 MED ORDER — DAPAGLIFLOZIN PROPANEDIOL 10 MG PO TABS
10.0000 mg | ORAL_TABLET | Freq: Every day | ORAL | 0 refills | Status: DC
  Filled 2022-08-11: qty 90, 90d supply, fill #0

## 2022-08-12 ENCOUNTER — Other Ambulatory Visit: Payer: Self-pay

## 2022-08-12 ENCOUNTER — Other Ambulatory Visit (HOSPITAL_COMMUNITY): Payer: Self-pay

## 2022-08-17 ENCOUNTER — Other Ambulatory Visit (HOSPITAL_COMMUNITY): Payer: Self-pay

## 2022-08-17 ENCOUNTER — Other Ambulatory Visit: Payer: Self-pay | Admitting: Cardiovascular Disease

## 2022-08-17 MED ORDER — POTASSIUM CHLORIDE CRYS ER 10 MEQ PO TBCR
10.0000 meq | EXTENDED_RELEASE_TABLET | Freq: Every day | ORAL | 1 refills | Status: DC
  Filled 2022-08-17: qty 30, 30d supply, fill #0
  Filled 2022-09-05: qty 30, 30d supply, fill #1

## 2022-08-18 ENCOUNTER — Other Ambulatory Visit: Payer: Self-pay

## 2022-08-19 ENCOUNTER — Other Ambulatory Visit (HOSPITAL_COMMUNITY): Payer: Self-pay

## 2022-08-19 ENCOUNTER — Ambulatory Visit (INDEPENDENT_AMBULATORY_CARE_PROVIDER_SITE_OTHER): Payer: Medicare HMO

## 2022-08-19 DIAGNOSIS — I472 Ventricular tachycardia, unspecified: Secondary | ICD-10-CM

## 2022-08-19 LAB — CUP PACEART REMOTE DEVICE CHECK
Battery Remaining Longevity: 29 mo
Battery Voltage: 2.96 V
Brady Statistic AP VP Percent: 1.01 %
Brady Statistic AP VS Percent: 95.8 %
Brady Statistic AS VP Percent: 0.02 %
Brady Statistic AS VS Percent: 3.18 %
Brady Statistic RA Percent Paced: 94.4 %
Brady Statistic RV Percent Paced: 1.12 %
Date Time Interrogation Session: 20240508012404
HighPow Impedance: 73 Ohm
Implantable Lead Connection Status: 753985
Implantable Lead Connection Status: 753985
Implantable Lead Implant Date: 20120120
Implantable Lead Implant Date: 20120120
Implantable Lead Location: 753859
Implantable Lead Location: 753860
Implantable Lead Model: 5076
Implantable Lead Model: 6935
Implantable Pulse Generator Implant Date: 20190618
Lead Channel Impedance Value: 342 Ohm
Lead Channel Impedance Value: 437 Ohm
Lead Channel Impedance Value: 437 Ohm
Lead Channel Pacing Threshold Amplitude: 0.5 V
Lead Channel Pacing Threshold Amplitude: 0.75 V
Lead Channel Pacing Threshold Pulse Width: 0.4 ms
Lead Channel Pacing Threshold Pulse Width: 0.4 ms
Lead Channel Sensing Intrinsic Amplitude: 0.25 mV
Lead Channel Sensing Intrinsic Amplitude: 0.25 mV
Lead Channel Sensing Intrinsic Amplitude: 11.625 mV
Lead Channel Sensing Intrinsic Amplitude: 11.625 mV
Lead Channel Setting Pacing Amplitude: 2 V
Lead Channel Setting Pacing Amplitude: 2.5 V
Lead Channel Setting Pacing Pulse Width: 0.4 ms
Lead Channel Setting Sensing Sensitivity: 0.3 mV
Zone Setting Status: 755011
Zone Setting Status: 755011

## 2022-08-20 ENCOUNTER — Telehealth: Payer: Self-pay

## 2022-08-20 DIAGNOSIS — I5032 Chronic diastolic (congestive) heart failure: Secondary | ICD-10-CM

## 2022-08-20 DIAGNOSIS — Z95810 Presence of automatic (implantable) cardiac defibrillator: Secondary | ICD-10-CM

## 2022-08-20 NOTE — Telephone Encounter (Signed)
Following alert received from [employer] received for 1 VF detection on 07/27/22 at 16:01 of 10 sec duration.  EGM is c/w VT at 222 bpm successfully treated with ATP x 1 burst.   Attempted to contact pt. No answer, LMTCB.

## 2022-08-20 NOTE — Telephone Encounter (Signed)
Patient called, does not recall any symptoms and compliant with meds. Advised of  DMV no driving x6 months starting 07/27/22. Shock plan reviewed.

## 2022-08-20 NOTE — Telephone Encounter (Signed)
Pt returned call

## 2022-08-21 ENCOUNTER — Other Ambulatory Visit: Payer: Self-pay

## 2022-08-21 NOTE — Telephone Encounter (Signed)
Agree with recommendations. Also please check labs: BMET and Magnesium. Thanks

## 2022-08-21 NOTE — Telephone Encounter (Signed)
Notified pt that Dr Royann Shivers would like her to get some lab work. Pt states that she will come to [facility] office on Monday to get lab work.   Order for BMP and Mag placed. Made pt's f/u appt.

## 2022-08-24 ENCOUNTER — Other Ambulatory Visit: Payer: Self-pay

## 2022-08-24 ENCOUNTER — Ambulatory Visit: Payer: Medicare HMO | Attending: Cardiovascular Disease

## 2022-08-24 DIAGNOSIS — Z95810 Presence of automatic (implantable) cardiac defibrillator: Secondary | ICD-10-CM

## 2022-08-24 DIAGNOSIS — I5032 Chronic diastolic (congestive) heart failure: Secondary | ICD-10-CM

## 2022-08-25 ENCOUNTER — Ambulatory Visit: Payer: Medicare HMO | Admitting: Podiatry

## 2022-08-25 ENCOUNTER — Encounter: Payer: Self-pay | Admitting: Podiatry

## 2022-08-25 VITALS — BP 112/71

## 2022-08-25 DIAGNOSIS — L84 Corns and callosities: Secondary | ICD-10-CM

## 2022-08-25 DIAGNOSIS — M79609 Pain in unspecified limb: Secondary | ICD-10-CM

## 2022-08-25 DIAGNOSIS — B351 Tinea unguium: Secondary | ICD-10-CM | POA: Diagnosis not present

## 2022-08-25 DIAGNOSIS — E1142 Type 2 diabetes mellitus with diabetic polyneuropathy: Secondary | ICD-10-CM | POA: Diagnosis not present

## 2022-08-25 LAB — BASIC METABOLIC PANEL
BUN/Creatinine Ratio: 14 (ref 12–28)
BUN: 19 mg/dL (ref 8–27)
CO2: 24 mmol/L (ref 20–29)
Calcium: 8.4 mg/dL — ABNORMAL LOW (ref 8.7–10.3)
Chloride: 101 mmol/L (ref 96–106)
Creatinine, Ser: 1.34 mg/dL — ABNORMAL HIGH (ref 0.57–1.00)
Glucose: 140 mg/dL — ABNORMAL HIGH (ref 70–99)
Potassium: 4.8 mmol/L (ref 3.5–5.2)
Sodium: 139 mmol/L (ref 134–144)
eGFR: 42 mL/min/{1.73_m2} — ABNORMAL LOW (ref >59)

## 2022-08-25 LAB — MAGNESIUM: Magnesium: 2.4 mg/dL — ABNORMAL HIGH (ref 1.6–2.3)

## 2022-08-25 NOTE — Progress Notes (Unsigned)
  Subjective:  Patient ID: [name], female    DOB: 05/19/1950,  MRN: [id number]  [name] presents to clinic today for {jgcomplaint:23593}  Chief Complaint  Patient presents with   Nail Problem    [facility] BS-119 A1C-7.5 PCP-[facility] PCP VST-2 months ago   New problem(s): None. {jgcomplaint:23593}  PCP is Maury Dus, MD.  Allergies  Allergen Reactions   Avelox [Moxifloxacin Hcl In Nacl] Other (See Comments)    Cardiac arrest per pt   Nsaids Other (See Comments)    Cardiac arrest per pt    Simvastatin Other (See Comments)    Muscle [name]   Ace Inhibitors Other (See Comments) and Cough   Jardiance [Empagliflozin] Other (See Comments)    Felt "crazy", fatigue, sweating, denies hypoglycemia while taking   Latex Rash    Review of Systems: Negative except as noted in the HPI.  Objective: No changes noted in today's physical examination. Vitals:   08/25/22 0859  BP: 112/71   [name] is a pleasant 72 y.o. female {jgbodyhabitus:24098} AAO x 3.  Vascular Examination: CFT <3 seconds b/l. DP/PT pulses faintly palpable b/l. Skin temperature gradient warm to warm b/l. No ischemia or gangrene. No cyanosis or clubbing noted b/l. Pedal hair sparse. No [name] with calf compression b/l.   Neurological Examination: Protective sensation decreased with 10 gram monofilament b/l. Vibratory sensation diminished b/l.  Dermatological Examination: Toenails 1-5 b/l thick, discolored, elongated with subungual debris and [name] on dorsal palpation.  Pedal skin is warm and supple b/l LE. No open wounds b/l LE. No interdigital macerations noted b/l LE.   Hyperkeratotic lesion(s) with no erythema, no edema, no drainage, no fluctuance: distal tip of left 2nd toe distal tip of right 2nd toe distal tip of left 3rd toe plantar IPJ of left great toe plantar IPJ of right great toe submet head 2 right foot submet head 3 left foot submet head 3 right foot 1st metatarsal head LLE 1st  metatarsal head RLE  Musculoskeletal Examination: Muscle strength 5/5 to b/l LE. Hammertoe deformity noted 2-5 b/l.  Radiographs: None  Assessment/Plan: No diagnosis found.  No orders of the defined types were placed in this encounter.   None {Jgplan:23602::"-Patient/POA to call should there be question/concern in the interim."}   Return in about 3 months (around 11/25/2022).  Freddie Breech, DPM

## 2022-08-26 NOTE — Progress Notes (Signed)
EPIC Encounter for ICM Monitoring  Patient Name: [name] is a 72 y.o. female Date: 08/26/2022 Primary Care Physican: Maury Dus, MD Primary Cardiologist: Croitoru Electrophysiologist: Croitoru 07/23/2022 Weight: 183-184 lbs                 Since 19-Aug-2022 Time in AT/AF <0.1 hr/day (<0.1%)                                  Spoke with patient and heart failure questions reviewed.  Transmission results reviewed.  Pt asymptomatic for fluid accumulation.  Reports feeling well at this time and voices no complaints.     Optivol thoracic impedance suggesting normal fluid levels.   ATP on 4/15 and 6 month driving restriction.   Prescribed dosage:  Torsemide 20 mg 1 tablet by mouth daily.   Potassium 10 mEq 1 tablet by mouth by mouth daily.   Labs: 08/24/2022 Creatinine 1.34, BUN 19, Potassium 4.8, Sodium 139, GFR 42 05/05/2022 Creatinine 1.12, BUN 15, Potassium 4.5, Sodium 136 01/15/2022 Creatinine 1.28, BUN 16, Potassium 4.1, Sodium 138, GFR 45 11/27/2021 Creatinine 1.34, BUN 19, Potassium 4.8, Sodium 141, GFR 43 11/06/2021 Creatinine 1.43, BUN 18, Potassium 5.4, Sodium 138, GFR 39 04/29/2021 Creatinine 1.14, BUN 22, Potassium 4.0, Sodium 133, GFR 52 04/15/2021 Creatinine 1.26, BUN 21, Potassium 4.6, Sodium 138, GFR 46 A complete set of results can be found in Results Review.   Recommendations:  No changes and encouraged to call if experiencing any fluid symptoms.   Follow-up plan: ICM clinic phone appointment on 09/28/2022.   91 day device clinic remote transmission 11/18/2022.     Next office visit scheduled:  11/19/2022 for Dr Royann Shivers.     Copy of ICM check sent to Dr. Royann Shivers.       3 month ICM trend: 08/24/2022.    12-14 Month ICM trend:     [name], RN 08/26/2022 1:30 PM

## 2022-09-05 ENCOUNTER — Other Ambulatory Visit: Payer: Self-pay | Admitting: Family Medicine

## 2022-09-05 ENCOUNTER — Other Ambulatory Visit (HOSPITAL_COMMUNITY): Payer: Self-pay

## 2022-09-05 ENCOUNTER — Other Ambulatory Visit: Payer: Self-pay | Admitting: Cardiovascular Disease

## 2022-09-05 DIAGNOSIS — E1140 Type 2 diabetes mellitus with diabetic neuropathy, unspecified: Secondary | ICD-10-CM

## 2022-09-08 ENCOUNTER — Other Ambulatory Visit (HOSPITAL_COMMUNITY): Payer: Self-pay

## 2022-09-08 ENCOUNTER — Other Ambulatory Visit: Payer: Self-pay

## 2022-09-08 MED ORDER — ROSUVASTATIN CALCIUM 10 MG PO TABS
10.0000 mg | ORAL_TABLET | Freq: Every day | ORAL | 2 refills | Status: DC
  Filled 2022-09-08: qty 90, 90d supply, fill #0
  Filled 2022-12-02: qty 90, 90d supply, fill #1
  Filled 2023-03-01: qty 90, 90d supply, fill #2

## 2022-09-08 MED ORDER — LOSARTAN POTASSIUM 25 MG PO TABS
25.0000 mg | ORAL_TABLET | Freq: Every day | ORAL | 0 refills | Status: DC
  Filled 2022-09-08: qty 90, 90d supply, fill #0

## 2022-09-09 ENCOUNTER — Other Ambulatory Visit (HOSPITAL_COMMUNITY): Payer: Self-pay

## 2022-09-09 NOTE — Progress Notes (Signed)
Remote ICD transmission.   

## 2022-09-19 ENCOUNTER — Other Ambulatory Visit (HOSPITAL_COMMUNITY): Payer: Self-pay

## 2022-09-19 ENCOUNTER — Other Ambulatory Visit: Payer: Self-pay | Admitting: Family Medicine

## 2022-09-20 NOTE — Progress Notes (Deleted)
Subjective:   [name] is a 72 y.o. female who presents for Medicare Annual (Subsequent) preventive examination.  Review of Systems    ***       Objective:    There were no vitals filed for this visit. There is no height or weight on file to calculate BMI.     07/14/2022    8:48 AM 11/18/2021    1:48 PM 04/15/2021    9:38 AM 09/19/2020    8:42 AM 06/25/2020   10:12 AM 06/04/2020    1:35 PM 05/01/2020    9:10 AM  Advanced Directives  Does Patient Have a Medical Advance Directive? Yes Yes Yes No No No No  Type of Special educational needs teacher of [place];Living will Living will      Does patient want to make changes to medical advance directive?  No - Patient declined       Copy of Healthcare Power of Attorney in Chart?  Yes - validated most recent copy scanned in chart (See row information)       Would patient like information on creating a medical advance directive?    No - Patient declined No - Patient declined No - Patient declined No - Patient declined    Current Medications (verified) Outpatient Encounter Medications as of 09/21/2022  Medication Sig   acetaminophen (TYLENOL 8 HOUR) 650 MG CR tablet Take 1 tablet (650 mg total) by mouth every 8 (eight) hours as needed for pain.   apixaban (ELIQUIS) 5 MG TABS tablet Take 1 tablet (5 mg total) by mouth 2 (two) times daily.   bisoprolol (ZEBETA) 10 MG tablet Take 1 tablet (10 mg total) by mouth in the morning and at bedtime.   budesonide-formoterol (SYMBICORT) 80-4.5 MCG/ACT inhaler INHALE 2 PUFFS INTO THE LUNGS IN THE MORNING AND AT BEDTIME   buPROPion (WELLBUTRIN XL) 150 MG 24 hr tablet Take 1 tablet (150 mg total) by mouth daily.   dapagliflozin propanediol (FARXIGA) 10 MG TABS tablet Take 1 tablet (10 mg total) by mouth daily.   diclofenac Sodium (VOLTAREN) 1 % GEL Apply 2 g topically 4 (four) times daily.   dofetilide (TIKOSYN) 250 MCG capsule TAKE 1 CAPSULE  BY MOUTH TWICE DAILY   famotidine (PEPCID) 20 MG tablet  Take 1 tablet (20 mg total) by mouth daily.   fluticasone (FLONASE) 50 MCG/ACT nasal spray instill 1 spray into each nostril twice a day   glucose blood test strip Use as instructed   insulin degludec (TRESIBA FLEXTOUCH) 100 UNIT/ML FlexTouch Pen Inject 40 Units into the skin daily.   Insulin Pen Needle (TECHLITE PEN NEEDLES) 32G X 4 MM MISC Use as directed with insulin once daily   levalbuterol (XOPENEX HFA) 45 MCG/ACT inhaler Inhale 1-2 puffs into the lungs every 4 (four) hours as needed for wheezing   losartan (COZAAR) 25 MG tablet Take 1 tablet (25 mg total) by mouth at bedtime.   montelukast (SINGULAIR) 10 MG tablet Take 1 tablet (10 mg total) by mouth at bedtime.   potassium chloride (KLOR-CON M) 10 MEQ tablet Take 1 tablet (10 mEq total) by mouth daily as directed   PRODIGY LANCETS 28G MISC 1 Units by Does not apply route 4 (four) times daily.   rosuvastatin (CRESTOR) 10 MG tablet Take 1 tablet (10 mg total) by mouth daily.   Semaglutide, 2 MG/DOSE, (OZEMPIC, 2 MG/DOSE,) 8 MG/3ML SOPN Inject 2 mg into the skin once a week.   torsemide (DEMADEX) 20 MG tablet  Take 1 tablet (20 mg total) by mouth daily.   No facility-administered encounter medications on file as of 09/21/2022.    Allergies (verified) Avelox [moxifloxacin hcl in nacl], Nsaids, Simvastatin, Ace inhibitors, Jardiance [empagliflozin], and Latex   History: Past Medical History:  Diagnosis Date   AICD (automatic cardioverter/defibrillator) present    Anemia    Anxiety    Arthritis    Arthritis of carpometacarpal (CMC) joint of thumb 03/26/2016   Asthma    has had multiple hospitalizations for this   Asthma, moderate persistent 12/10/2011   Atrial fibrillation (HCC)    ablation x 2 [facility], 01/2006, 2011.  on warfarin   Cardiac arrest (HCC) 04/2010   in hospital for pneumonia when this occured- occured at the hospital   CHF (congestive heart failure) (HCC) 2012   Echo 08/08/10 by [facility]. EF 35-45%. LV systolic  function moderately reduced. Moderate global hypokinesis of LV.  RV systolic function moderately reduced. Mild MR. Trace AR.   Chronic diastolic heart failure (HCC) 01/07/2018   CKD (chronic kidney disease) stage 2, GFR 60-89 ml/min 02/22/2012   Her cr range 1.2-1.5 since Jan 2012 after hospitalization. 10/13 Estimated Creatinine Clearance: 69.3 ml/min (by C-G formula based on Cr of 1).     Complication of anesthesia    difficult time waking up after anesthesia   Depression    Diabetes mellitus    Type 2   Diabetic neuropathy (HCC) 09/18/2011   GASTROESOPHAGEAL REFLUX, NO ESOPHAGITIS 06/10/2006   Qualifier: Diagnosis of  By: Abundio Miu     GERD (gastroesophageal reflux disease) 03/23/2018   History of hiatal hernia    History of kidney stones    HYPERCHOLESTEROLEMIA 06/10/2006   LDL 93 at 01/2011 check-  Continue pravastatin 20mg  po daily.  --discuss health modifications and possibly increaseing dose at next appt.  Cardiology- Dr little- stopped pravachol on 05/21/11- 2/2 allergies?     Hyperlipidemia    Hypertension    ICD (St. Jude Protecta dual-chamber),secondary prevention (VF arrest) January 2012 11/19/2012   Iliotibial band syndrome, left 11/01/2018   Limb pain 06/04/2008   LLE, Baker's cyst in popliteal fossa, no DVT   Long term current use of anticoagulant therapy 08/21/2015   Non-ischemic cardiomyopathy (HCC)    echo 08/08/10 - EF 35-45% LV and RV systolic function mod reduced   Obesity (BMI 30-39.9) 01/04/2014   OSA on CPAP 06/10/2006   Sleep study 02/2014 : severe apnea, corrected with nasal pillows and 8cmh2o     Osteoarthritis of right knee 08/28/2008   Qualifier: Diagnosis of  By: Pearletha Forge MD, Vincenza Hews     Primary localized osteoarthritis of right knee    Sleep apnea    wears CPAP nightly   Type 2 diabetes mellitus with diabetic neuropathy, with long-term current use of insulin (HCC) 06/10/2006              Ventricular tachycardia (HCC) 05/22/2015   Visit for  monitoring Tikosyn therapy 08/21/2015   Vocal cord disease    Past Surgical History:  Procedure Laterality Date   BREAST EXCISIONAL BIOPSY Right 1999   BREAST LUMPECTOMY Right    CARDIAC DEFIBRILLATOR PLACEMENT  05/02/10   Medtronic Protecta XT-DR for CHF-VT, last download 04/12/12   CHOLECYSTECTOMY     COLONOSCOPY     HAMMER TOE SURGERY Right    ICD GENERATOR CHANGEOUT N/A 09/28/2017   Procedure: ICD GENERATOR CHANGEOUT;  Surgeon: Thurmon Fair, MD;  Location: [facility];  Service: Cardiovascular;  Laterality: N/A;   KNEE ARTHROSCOPY Right    PAF Ablation     By Dr Sampson Goon. Now sees Dr Steele Berg at [facility]   TOTAL KNEE ARTHROPLASTY Left 09/17/2014   Procedure: TOTAL KNEE ARTHROPLASTY;  Surgeon: Salvatore Marvel, MD;  Location: [facility] OR;  Service: Orthopedics;  Laterality: Left;  pt has ICD   TOTAL KNEE ARTHROPLASTY Right 04/28/2021   Procedure: TOTAL KNEE ARTHROPLASTY;  Surgeon: Joen Laura, MD;  Location: [facility] ORS;  Service: Orthopedics;  Laterality: Right;   TUBAL LIGATION     Family History  Problem Relation Age of Onset   Diabetes Father    Hypertension Father    Diabetes Brother    Hypertension Brother    Lung disease Neg Hx    Cancer Neg Hx    Rheumatologic disease Neg Hx    Social History   Socioeconomic History   Marital status: Widowed    Spouse name: Not on file   Number of children: 3   Years of education: 10   Highest education level: 10th grade  Occupational History   Occupation: Retired  Tobacco Use   Smoking status: Never    Passive exposure: Never   Smokeless tobacco: Never  Vaping Use   Vaping Use: Never used  Substance and Sexual Activity   Alcohol use: No   Drug use: No   Sexual activity: Not Currently  Other Topics Concern   Not on file  Social History Narrative      [facility]:   She is originally from [place]. Has always lived in [place]. 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 Currently has a small dog. No bird or mold exposure.       Current Social History August 2023      Patient lives with daughter, Lafonda Mosses, in two level home    One son in [place] and another daughter in [place]    Patient is a widow   Transportation: Patient has own vehicle and drives herself    Important Relationships: Daughter, Lafonda Mosses     Education: 10 th grade   Interests Fun: Read, do puzzles    Religious/Personal Beliefs: Non-Denominational                                                                                                Patient exercises 2x per week using stationary bike for ~45 minutes   Social Determinants of Health   Financial Resource Strain: Low Risk  (11/18/2021)   Overall Financial Resource Strain (CARDIA)    Difficulty of Paying Living Expenses: Not hard at all  Food Insecurity: No Food Insecurity (11/18/2021)   Hunger Vital Sign    Worried About Running Out of Food in the Last Year: Never true    Ran Out of Food in the Last Year: Never true  Transportation Needs: No Transportation Needs (11/18/2021)   PRAPARE - Administrator, Civil Service (Medical): No    Lack of Transportation (Non-Medical): No  Physical Activity: Insufficiently Active (  11/18/2021)   Exercise Vital Sign    Days of Exercise per Week: 2 days    Minutes of Exercise per Session: 50 min  Stress: No Stress Concern Present (11/18/2021)   [employer] of Occupational Health - Occupational Stress Questionnaire    Feeling of Stress : Not at all  Social Connections: Moderately Isolated (11/18/2021)   Social Connection and Isolation Panel [NHANES]    Frequency of Communication with Friends and Family: More than three times a week    Frequency of Social Gatherings with Friends and Family: More than three times a week    Attends Religious Services: More than 4 times per year    Active Member of [employer] or Organizations: No    Attends Tax inspector Meetings: Never    Marital Status: Widowed    Tobacco Counseling Counseling given: Not Answered   Clinical Intake:                 Diabetic?Yes   Nutrition Risk Assessment:  Has the patient had any N/V/D within the last 2 months?  {YES/NO:21197} Does the patient have any non-healing wounds?  {YES/NO:21197} Has the patient had any unintentional weight loss or weight gain?  {YES/NO:21197}  Diabetes:  Is the patient diabetic?  {YES/NO:21197} If diabetic, was a CBG obtained today?  {YES/NO:21197} Did the patient bring in their glucometer from home?  {YES/NO:21197} How often do you monitor your CBG's? ***.   Financial Strains and Diabetes Management:  Are you having any financial strains with the device, your supplies or your medication? {YES/NO:21197}.  Does the patient want to be seen by Chronic Care Management for management of their diabetes?  {YES/NO:21197} Would the patient like to be referred to a Nutritionist or for Diabetic Management?  {YES/NO:21197}  Diabetic Exams:  {Diabetic Eye Exam:2101801} Diabetic Foot Exam: Completed 01/01/22          Activities of Daily Living    11/18/2021    1:49 PM  In your present state of health, do you have any difficulty performing the following activities:  Hearing? 0  Vision? 0  Difficulty concentrating or making decisions? 0  Walking or climbing stairs? 1  Comment recent knee surgery  Dressing or bathing? 0  Doing errands, shopping? 0  Preparing Food and eating ? N  Using the Toilet? N  In the past six months, have you accidently leaked urine? N  Do you have problems with loss of bowel control? N  Managing your Medications? N  Managing your Finances? N  Housekeeping or managing your Housekeeping? N    Patient Care Team: Maury Dus, MD as PCP - General (Family Medicine) Croitoru, Rachelle Hora, MD as PCP - Cardiology (Cardiology) Storm Frisk, MD as Attending Physician (Pulmonary  Disease) Carrington Clamp, DPM (Inactive) as Consulting Physician (Podiatry) Lucille Passy, MD as Referring Physician (Ophthalmology) Croitoru, Rachelle Hora, MD as Consulting Physician (Cardiology)  Indicate any recent Medical Services you may have received from other than [facility] providers in the past year (date may be approximate).     Assessment:   This is a routine wellness examination for [facility].  Hearing/Vision screen No results found.  Dietary issues and exercise activities discussed:     Goals Addressed   None    Depression Screen    07/14/2022    9:43 AM 11/18/2021    1:45 PM 08/05/2021   10:18 AM 09/19/2020    8:42 AM 08/30/2020    8:51 AM 07/11/2020    8:53  AM 06/25/2020   10:12 AM  PHQ 2/9 Scores  PHQ - 2 Score 0 0 0 0 0 0 0  PHQ- 9 Score 0  0 0 0 0 0    Fall Risk    07/14/2022    9:43 AM 01/01/2022    9:04 AM 11/18/2021    1:48 PM 06/25/2020   10:12 AM 06/04/2020    1:35 PM  Fall Risk   Falls in the past year? 0 0 0 0 0  Number falls in past yr: 0 0 0 0 0  Injury with Fall? 0 0 0 0 0  Risk for fall due to :   No Fall Risks    Follow up   Falls prevention discussed      FALL RISK PREVENTION PERTAINING TO THE HOME:  Any stairs in or around the home? {YES/NO:21197} If so, are there any without handrails? {YES/NO:21197} Home free of loose throw rugs in walkways, pet beds, electrical cords, etc? {YES/NO:21197} Adequate lighting in your home to reduce risk of falls? {YES/NO:21197}  ASSISTIVE DEVICES UTILIZED TO PREVENT FALLS:  Life alert? {YES/NO:21197} Use of a cane, walker or w/c? {YES/NO:21197} Grab bars in the bathroom? {YES/NO:21197} Shower chair or bench in shower? {YES/NO:21197} Elevated toilet seat or a handicapped toilet? {YES/NO:21197}  TIMED UP AND GO:  Was the test performed? No . Telephonic visit   Cognitive Function:        11/18/2021    1:51 PM  6CIT Screen  What Year? 0 points  What month? 0 points  What time? 0 points  Count back from 20 0  points  Months in reverse 0 points  Repeat phrase 0 points  Total Score 0 points    Immunizations Immunization History  Administered Date(s) Administered   Fluad Quad(high Dose 65+) 01/17/2019, 01/31/2020, 12/11/2020, 01/01/2022   H1N1 03/23/2008   Influenza Split 01/06/2011, 01/18/2012   Influenza Whole 01/09/2008, 01/21/2009, 01/14/2010   Influenza,inj,Quad PF,6+ Mos 12/21/2012, 01/04/2014, 01/14/2015, 12/06/2015, 12/04/2016, 12/08/2017   Moderna Sars-Covid-2 Vaccination 05/17/2019, 06/14/2019, 02/09/2020   Pneumococcal Conjugate-13 09/25/2013   Pneumococcal Polysaccharide-23 01/11/2001, 02/22/2012, 04/08/2017   Td 01/11/2001   Tdap 02/22/2012   Zoster Recombinat (Shingrix) 10/12/2020, 12/12/2020    TDAP status: Due, Education has been provided regarding the importance of this vaccine. Advised may receive this vaccine at local pharmacy or Health Dept. Aware to provide a copy of the vaccination record if obtained from local pharmacy or Health Dept. Verbalized acceptance and understanding.  Pneumococcal vaccine status: Up to date  Covid-19 vaccine status: Information provided on how to obtain vaccines.   Qualifies for Shingles Vaccine? Yes   Zostavax completed No   Shingrix Completed?: Yes  Screening Tests Health Maintenance  Topic Date Due   Diabetic kidney evaluation - Urine ACR  09/01/2016   OPHTHALMOLOGY EXAM  03/11/2018   COVID-19 Vaccine (4 - 2023-24 season) 12/12/2021   DTaP/Tdap/Td (3 - Td or Tdap) 02/21/2022   Medicare Annual Wellness (AWV)  11/19/2022   HEMOGLOBIN A1C  10/13/2022   INFLUENZA VACCINE  11/12/2022   FOOT EXAM  01/02/2023   Diabetic kidney evaluation - eGFR measurement  08/24/2023   MAMMOGRAM  11/25/2023   Colonoscopy  10/29/2030   Pneumonia Vaccine 54+ Years old  Completed   DEXA SCAN  Completed   Hepatitis C Screening  Completed   Zoster Vaccines- Shingrix  Completed   HPV VACCINES  Aged Out    Health Maintenance  Health Maintenance Due   Topic Date Due  Diabetic kidney evaluation - Urine ACR  09/01/2016   OPHTHALMOLOGY EXAM  03/11/2018   COVID-19 Vaccine (4 - 2023-24 season) 12/12/2021   DTaP/Tdap/Td (3 - Td or Tdap) 02/21/2022   Medicare Annual Wellness (AWV)  11/19/2022    Colorectal cancer screening: Type of screening: Colonoscopy. Completed 10/28/20. Repeat every 10 years  Mammogram status: Completed 11/24/21. Repeat every year  Bone Density status: Completed 12/31/16. Results reflect: {Bone density results:[id number]}  Lung Cancer Screening: (Low Dose CT Chest recommended if Age 20-80 years, 30 pack-year currently smoking OR have quit w/in 15years.) does not qualify.   Lung Cancer Screening Referral: n/a  Additional Screening:  Hepatitis C Screening: does qualify; Completed 09/02/15  Vision Screening: Recommended annual ophthalmology exams for early detection of glaucoma and other disorders of the eye. Is the patient up to date with their annual eye exam?  {YES/NO:21197} Who is the provider or what is the name of the office in which the patient attends annual eye exams? *** If pt is not established with a provider, would they like to be referred to a provider to establish care? {YES/NO:21197}.   Dental Screening: Recommended annual dental exams for proper oral hygiene  Community Resource Referral / Chronic Care Management: CRR required this visit?  {YES/NO:21197}  CCM required this visit?  {YES/NO:21197}     Plan:     I have personally reviewed and noted the following in the patient's chart:   Medical and social history Use of alcohol, tobacco or illicit drugs  Current medications and supplements including opioid prescriptions. {Opioid Prescriptions:[phone]} Functional ability and status Nutritional status Physical activity Advanced directives List of other physicians Hospitalizations, surgeries, and ER visits in previous 12 months Vitals Screenings to include cognitive, depression, and  falls Referrals and appointments  In addition, I have reviewed and discussed with patient certain preventive protocols, quality metrics, and best practice recommendations. A written personalized care plan for preventive services as well as general preventive health recommendations were provided to patient.     Durwin Nora, [place]   4/0/9811   Due to this being a virtual visit, the after visit summary with patients personalized plan was offered to patient via mail or my-chart. ***Patient declined at this time./ Patient would like to access on my-chart/ per request, patient was mailed a copy of AVS./ Patient preferred to pick up at office at next visit  Nurse Notes: ***

## 2022-09-21 ENCOUNTER — Other Ambulatory Visit: Payer: Self-pay

## 2022-09-21 ENCOUNTER — Other Ambulatory Visit (HOSPITAL_COMMUNITY): Payer: Self-pay

## 2022-09-21 MED ORDER — OZEMPIC (2 MG/DOSE) 8 MG/3ML ~~LOC~~ SOPN
2.0000 mg | PEN_INJECTOR | SUBCUTANEOUS | 2 refills | Status: DC
  Filled 2022-09-21: qty 3, 28d supply, fill #0
  Filled 2022-10-13: qty 3, 28d supply, fill #1
  Filled 2022-11-06: qty 3, 28d supply, fill #2

## 2022-09-25 ENCOUNTER — Ambulatory Visit (INDEPENDENT_AMBULATORY_CARE_PROVIDER_SITE_OTHER): Payer: Medicare HMO

## 2022-09-25 VITALS — Ht 66.0 in | Wt 189.0 lb

## 2022-09-25 DIAGNOSIS — Z1231 Encounter for screening mammogram for malignant neoplasm of breast: Secondary | ICD-10-CM

## 2022-09-25 DIAGNOSIS — Z0000 Encounter for general adult medical examination without abnormal findings: Secondary | ICD-10-CM | POA: Diagnosis not present

## 2022-09-25 DIAGNOSIS — M8580 Other specified disorders of bone density and structure, unspecified site: Secondary | ICD-10-CM

## 2022-09-25 DIAGNOSIS — Z780 Asymptomatic menopausal state: Secondary | ICD-10-CM

## 2022-09-25 NOTE — Patient Instructions (Signed)
[name] , Thank you for taking time to come for your Medicare Wellness Visit. I appreciate your ongoing commitment to your health goals. Please review the following plan we discussed and let me know if I can assist you in the future.   These are the goals we discussed:  Goals       HEMOGLOBIN A1C < 7      Last A1C- 6.20 July 2021      MSW Care Coordination (pt-stated)      Patient advise she is doing well and SW assistance isn't needed at this time.  Patient understands to contact PCP if additional services is needed.        This is a list of the screening recommended for you and due dates:  Health Maintenance  Topic Date Due   Yearly kidney health urinalysis for diabetes  09/01/2016   Eye exam for diabetics  03/11/2018   COVID-19 Vaccine (4 - 2023-24 season) 12/12/2021   DTaP/Tdap/Td vaccine (3 - Td or Tdap) 02/21/2022   Hemoglobin A1C  10/13/2022   Flu Shot  11/12/2022   Complete foot exam   01/02/2023   Yearly kidney function blood test for diabetes  08/24/2023   Medicare Annual Wellness Visit  09/25/2023   Mammogram  11/25/2023   Colon Cancer Screening  10/29/2030   Pneumonia Vaccine  Completed   DEXA scan (bone density measurement)  Completed   Hepatitis C Screening  Completed   Zoster (Shingles) Vaccine  Completed   HPV Vaccine  Aged Out    Advanced directives: Information on Advanced Care Planning can be found at [facility] Advance Health Care Directives Advance Health Care Directives ([email or web address] Please bring a copy of your health care power of attorney and living will to the office to be added to your chart at your convenience.  Conditions/risks identified: Aim for 30 minutes of exercise or brisk walking, 6-8 glasses of water, and 5 servings of fruits and vegetables each day.  Next appointment: Follow up in one year for your annual wellness visit   The number to schedule your mammogram and bone density at [facility] is  [phone]    Preventive Care 65 Years and Older, Female Preventive care refers to lifestyle choices and visits with your health care provider that can promote health and wellness. What does preventive care include? A yearly physical exam. This is also called an annual well check. Dental exams once or twice a year. Routine eye exams. Ask your health care provider how often you should have your eyes checked. Personal lifestyle choices, including: Daily care of your teeth and gums. Regular physical activity. Eating a healthy diet. Avoiding tobacco and drug use. Limiting alcohol use. Practicing safe sex. Taking low-dose aspirin every day. Taking vitamin and mineral supplements as recommended by your health care provider. What happens during an annual well check? The services and screenings done by your health care provider during your annual well check will depend on your age, overall health, lifestyle risk factors, and family history of disease. Counseling  Your health care provider may ask you questions about your: Alcohol use. Tobacco use. Drug use. Emotional well-being. Home and relationship well-being. Sexual activity. Eating habits. History of falls. Memory and ability to understand (cognition). Work and work Astronomer. Reproductive health. Screening  You may have the following tests or measurements: Height, weight, and BMI. Blood pressure. Lipid and cholesterol levels. These may be checked every 5 years, or more frequently if you  are over 53 years old. Skin check. Lung cancer screening. You may have this screening every year starting at age 76 if you have a 30-pack-year history of smoking and currently smoke or have quit within the past 15 years. Fecal occult blood test (FOBT) of the stool. You may have this test every year starting at age 31. Flexible sigmoidoscopy or colonoscopy. You may have a sigmoidoscopy every 5 years or a colonoscopy every 10 years starting at  age 59. Hepatitis C blood test. Hepatitis B blood test. Sexually transmitted disease (STD) testing. Diabetes screening. This is done by checking your blood sugar (glucose) after you have not eaten for a while (fasting). You may have this done every 1-3 years. Bone density scan. This is done to screen for osteoporosis. You may have this done starting at age 2. Mammogram. This may be done every 1-2 years. Talk to your health care provider about how often you should have regular mammograms. Talk with your health care provider about your test results, treatment options, and if necessary, the need for more tests. Vaccines  Your health care provider may recommend certain vaccines, such as: Influenza vaccine. This is recommended every year. Tetanus, diphtheria, and acellular pertussis (Tdap, Td) vaccine. You may need a Td booster every 10 years. Zoster vaccine. You may need this after age 49. Pneumococcal 13-valent conjugate (PCV13) vaccine. One dose is recommended after age 56. Pneumococcal polysaccharide (PPSV23) vaccine. One dose is recommended after age 63. Talk to your health care provider about which screenings and vaccines you need and how often you need them. This information is not intended to replace advice given to you by your health care provider. Make sure you discuss any questions you have with your health care provider. Document Released: 04/26/2015 Document Revised: 12/18/2015 Document Reviewed: 01/29/2015 Elsevier Interactive Patient Education  2017 [employer].  Fall Prevention in the Home Falls can cause injuries. They can happen to people of all ages. There are many things you can do to make your home safe and to help prevent falls. What can I do on the outside of my home? Regularly fix the edges of walkways and driveways and fix any cracks. Remove anything that might make you trip as you walk through a door, such as a raised step or threshold. Trim any bushes or trees on the  path to your home. Use bright outdoor lighting. Clear any walking paths of anything that might make someone trip, such as rocks or tools. Regularly check to see if handrails are loose or broken. Make sure that both sides of any steps have handrails. Any raised decks and porches should have guardrails on the edges. Have any leaves, snow, or ice cleared regularly. Use sand or salt on walking paths during winter. Clean up any spills in your garage right away. This includes oil or grease spills. What can I do in the bathroom? Use night lights. Install grab bars by the toilet and in the tub and shower. Do not use towel bars as grab bars. Use non-skid mats or decals in the tub or shower. If you need to sit down in the shower, use a plastic, non-slip stool. Keep the floor dry. Clean up any water that spills on the floor as soon as it happens. Remove soap buildup in the tub or shower regularly. Attach bath mats securely with double-sided non-slip rug tape. Do not have throw rugs and other things on the floor that can make you trip. What can I do in  the bedroom? Use night lights. Make sure that you have a light by your bed that is easy to reach. Do not use any sheets or blankets that are too big for your bed. They should not hang down onto the floor. Have a firm chair that has side arms. You can use this for support while you get dressed. Do not have throw rugs and other things on the floor that can make you trip. What can I do in the kitchen? Clean up any spills right away. Avoid walking on wet floors. Keep items that you use a lot in easy-to-reach places. If you need to reach something above you, use a strong step stool that has a grab bar. Keep electrical cords out of the way. Do not use floor polish or wax that makes floors slippery. If you must use wax, use non-skid floor wax. Do not have throw rugs and other things on the floor that can make you trip. What can I do with my stairs? Do not  leave any items on the stairs. Make sure that there are handrails on both sides of the stairs and use them. Fix handrails that are broken or loose. Make sure that handrails are as long as the stairways. Check any carpeting to make sure that it is firmly attached to the stairs. Fix any carpet that is loose or worn. Avoid having throw rugs at the top or bottom of the stairs. If you do have throw rugs, attach them to the floor with carpet tape. Make sure that you have a light switch at the top of the stairs and the bottom of the stairs. If you do not have them, ask someone to add them for you. What else can I do to help prevent falls? Wear shoes that: Do not have high heels. Have rubber bottoms. Are comfortable and fit you well. Are closed at the toe. Do not wear sandals. If you use a stepladder: Make sure that it is fully opened. Do not climb a closed stepladder. Make sure that both sides of the stepladder are locked into place. Ask someone to hold it for you, if possible. Clearly mark and make sure that you can see: Any grab bars or handrails. First and last steps. Where the edge of each step is. Use tools that help you move around (mobility aids) if they are needed. These include: Canes. Walkers. Scooters. Crutches. Turn on the lights when you go into a dark area. Replace any light bulbs as soon as they burn out. Set up your furniture so you have a clear path. Avoid moving your furniture around. If any of your floors are uneven, fix them. If there are any pets around you, be aware of where they are. Review your medicines with your doctor. Some medicines can make you feel dizzy. This can increase your chance of falling. Ask your doctor what other things that you can do to help prevent falls. This information is not intended to replace advice given to you by your health care provider. Make sure you discuss any questions you have with your health care provider. Document Released:  01/24/2009 Document Revised: 09/05/2015 Document Reviewed: 05/04/2014 Elsevier Interactive Patient Education  2017 [employer].

## 2022-09-25 NOTE — Progress Notes (Addendum)
Subjective:   [name] is a 72 y.o. female who presents for Medicare Annual (Subsequent) preventive examination.  I connected with  [name] on 09/25/22 by a audio enabled telemedicine application and verified that I am speaking with the correct person using two identifiers.  Patient Location: Home  Provider Location: Home Office  I discussed the limitations of evaluation and management by telemedicine. The patient expressed understanding and agreed to proceed.  Review of Systems     Cardiac Risk Factors include: advanced age (>46men, >39 women);diabetes mellitus;dyslipidemia     Objective:    Today's Vitals   09/25/22 1304  Weight: 189 lb (85.7 kg)  Height: 5\' 6"  (1.676 m)   Body mass index is 30.51 kg/m.     09/25/2022    2:20 PM 07/14/2022    8:48 AM 11/18/2021    1:48 PM 04/15/2021    9:38 AM 09/19/2020    8:42 AM 06/25/2020   10:12 AM 06/04/2020    1:35 PM  Advanced Directives  Does Patient Have a Medical Advance Directive? No Yes Yes Yes No No No  Type of Best boy of [place];Living will Living will     Does patient want to make changes to medical advance directive?   No - Patient declined      Copy of Healthcare Power of Attorney in Chart?   Yes - validated most recent copy scanned in chart (See row information)      Would patient like information on creating a medical advance directive? Yes (MAU/Ambulatory/Procedural Areas - Information given)    No - Patient declined No - Patient declined No - Patient declined    Current Medications (verified) Outpatient Encounter Medications as of 09/25/2022  Medication Sig   acetaminophen (TYLENOL 8 HOUR) 650 MG CR tablet Take 1 tablet (650 mg total) by mouth every 8 (eight) hours as needed for pain.   apixaban (ELIQUIS) 5 MG TABS tablet Take 1 tablet (5 mg total) by mouth 2 (two) times daily.   bisoprolol (ZEBETA) 10 MG tablet Take 1 tablet (10 mg total) by mouth in the morning and at bedtime.    budesonide-formoterol (SYMBICORT) 80-4.5 MCG/ACT inhaler INHALE 2 PUFFS INTO THE LUNGS IN THE MORNING AND AT BEDTIME   buPROPion (WELLBUTRIN XL) 150 MG 24 hr tablet Take 1 tablet (150 mg total) by mouth daily.   dapagliflozin propanediol (FARXIGA) 10 MG TABS tablet Take 1 tablet (10 mg total) by mouth daily.   diclofenac Sodium (VOLTAREN) 1 % GEL Apply 2 g topically 4 (four) times daily.   dofetilide (TIKOSYN) 250 MCG capsule TAKE 1 CAPSULE  BY MOUTH TWICE DAILY   famotidine (PEPCID) 20 MG tablet Take 1 tablet (20 mg total) by mouth daily.   fluticasone (FLONASE) 50 MCG/ACT nasal spray instill 1 spray into each nostril twice a day   glucose blood test strip Use as instructed   insulin degludec (TRESIBA FLEXTOUCH) 100 UNIT/ML FlexTouch Pen Inject 40 Units into the skin daily.   Insulin Pen Needle (TECHLITE PEN NEEDLES) 32G X 4 MM MISC Use as directed with insulin once daily   levalbuterol (XOPENEX HFA) 45 MCG/ACT inhaler Inhale 1-2 puffs into the lungs every 4 (four) hours as needed for wheezing   losartan (COZAAR) 25 MG tablet Take 1 tablet (25 mg total) by mouth at bedtime.   montelukast (SINGULAIR) 10 MG tablet Take 1 tablet (10 mg total) by mouth at bedtime.   potassium chloride (KLOR-CON M)  10 MEQ tablet Take 1 tablet (10 mEq total) by mouth daily as directed   PRODIGY LANCETS 28G MISC 1 Units by Does not apply route 4 (four) times daily.   rosuvastatin (CRESTOR) 10 MG tablet Take 1 tablet (10 mg total) by mouth daily.   Semaglutide, 2 MG/DOSE, (OZEMPIC, 2 MG/DOSE,) 8 MG/3ML SOPN Inject 2 mg into the skin once a week.   torsemide (DEMADEX) 20 MG tablet Take 1 tablet (20 mg total) by mouth daily.   No facility-administered encounter medications on file as of 09/25/2022.    Allergies (verified) Avelox [moxifloxacin hcl in nacl], Nsaids, Simvastatin, Ace inhibitors, Jardiance [empagliflozin], and Latex   History: Past Medical History:  Diagnosis Date   AICD (automatic  cardioverter/defibrillator) present    Anemia    Anxiety    Arthritis    Arthritis of carpometacarpal (CMC) joint of thumb 03/26/2016   Asthma    has had multiple hospitalizations for this   Asthma, moderate persistent 12/10/2011   Atrial fibrillation (HCC)    ablation x 2 [facility], 01/2006, 2011.  on warfarin   Cardiac arrest (HCC) 04/2010   in hospital for pneumonia when this occured- occured at the hospital   CHF (congestive heart failure) (HCC) 2012   Echo 08/08/10 by [facility]. EF 35-45%. LV systolic function moderately reduced. Moderate global hypokinesis of LV.  RV systolic function moderately reduced. Mild MR. Trace AR.   Chronic diastolic heart failure (HCC) 01/07/2018   CKD (chronic kidney disease) stage 2, GFR 60-89 ml/min 02/22/2012   Her cr range 1.2-1.5 since Jan 2012 after hospitalization. 10/13 Estimated Creatinine Clearance: 69.3 ml/min (by C-G formula based on Cr of 1).     Complication of anesthesia    difficult time waking up after anesthesia   Depression    Diabetes mellitus    Type 2   Diabetic neuropathy (HCC) 09/18/2011   GASTROESOPHAGEAL REFLUX, NO ESOPHAGITIS 06/10/2006   Qualifier: Diagnosis of  By: Abundio Miu     GERD (gastroesophageal reflux disease) 03/23/2018   History of hiatal hernia    History of kidney stones    HYPERCHOLESTEROLEMIA 06/10/2006   LDL 93 at 01/2011 check-  Continue pravastatin 20mg  po daily.  --discuss health modifications and possibly increaseing dose at next appt.  Cardiology- Dr little- stopped pravachol on 05/21/11- 2/2 allergies?     Hyperlipidemia    Hypertension    ICD (St. Jude Protecta dual-chamber),secondary prevention (VF arrest) January 2012 11/19/2012   Iliotibial band syndrome, left 11/01/2018   Limb pain 06/04/2008   LLE, Baker's cyst in popliteal fossa, no DVT   Long term current use of anticoagulant therapy 08/21/2015   Non-ischemic cardiomyopathy (HCC)    echo 08/08/10 - EF 35-45% LV and RV systolic  function mod reduced   Obesity (BMI 30-39.9) 01/04/2014   OSA on CPAP 06/10/2006   Sleep study 02/2014 : severe apnea, corrected with nasal pillows and 8cmh2o     Osteoarthritis of right knee 08/28/2008   Qualifier: Diagnosis of  By: Pearletha Forge MD, Vincenza Hews     Primary localized osteoarthritis of right knee    Sleep apnea    wears CPAP nightly   Type 2 diabetes mellitus with diabetic neuropathy, with long-term current use of insulin (HCC) 06/10/2006              Ventricular tachycardia (HCC) 05/22/2015   Visit for monitoring Tikosyn therapy 08/21/2015   Vocal cord disease    Past Surgical History:  Procedure Laterality Date  BREAST EXCISIONAL BIOPSY Right 1999   BREAST LUMPECTOMY Right    CARDIAC DEFIBRILLATOR PLACEMENT  05/02/10   Medtronic Protecta XT-DR for CHF-VT, last download 04/12/12   CHOLECYSTECTOMY     COLONOSCOPY     HAMMER TOE SURGERY Right    ICD GENERATOR CHANGEOUT N/A 09/28/2017   Procedure: ICD GENERATOR CHANGEOUT;  Surgeon: Thurmon Fair, MD;  Location: [facility];  Service: Cardiovascular;  Laterality: N/A;   KNEE ARTHROSCOPY Right    PAF Ablation     By Dr Sampson Goon. Now sees Dr Steele Berg at [facility]   TOTAL KNEE ARTHROPLASTY Left 09/17/2014   Procedure: TOTAL KNEE ARTHROPLASTY;  Surgeon: Salvatore Marvel, MD;  Location: [facility] OR;  Service: Orthopedics;  Laterality: Left;  pt has ICD   TOTAL KNEE ARTHROPLASTY Right 04/28/2021   Procedure: TOTAL KNEE ARTHROPLASTY;  Surgeon: Joen Laura, MD;  Location: [facility] ORS;  Service: Orthopedics;  Laterality: Right;   TUBAL LIGATION     Family History  Problem Relation Age of Onset   Diabetes Father    Hypertension Father    Diabetes Brother    Hypertension Brother    Lung disease Neg Hx    Cancer Neg Hx    Rheumatologic disease Neg Hx    Social History   Socioeconomic History   Marital status: Widowed    Spouse name: Not on file   Number of children: 3   Years of education: 10   Highest education level: 10th  grade  Occupational History   Occupation: Retired  Tobacco Use   Smoking status: Never    Passive exposure: Never   Smokeless tobacco: Never  Vaping Use   Vaping Use: Never used  Substance and Sexual Activity   Alcohol use: No   Drug use: No   Sexual activity: Not Currently  Other Topics Concern   Not on file  Social History Narrative      [facility]:   She is originally from [place]. Has always lived in [place]. 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 Currently has a small dog. No bird or mold exposure.       Current Social History August 2023      Patient lives with daughter, Lafonda Mosses, in two level home    One son in [place] and another daughter in [place]    Patient is a widow   Transportation: Patient has own vehicle and drives herself    Important Relationships: Daughter, Lafonda Mosses     Education: 10 th grade   Interests Fun: Read, do puzzles    Religious/Personal Beliefs: Non-Denominational                                                                                                Patient exercises 2x per week using stationary bike for ~45 minutes   Social Determinants of Health   Financial Resource Strain: Low Risk  (09/25/2022)   Overall Financial Resource Strain (CARDIA)    Difficulty of Paying Living Expenses: Not hard at all  Food Insecurity: No Food Insecurity (09/25/2022)   Hunger Vital Sign    Worried About Running Out of Food in the Last Year: Never true    Ran Out of Food in the Last Year: Never true  Transportation Needs: No Transportation Needs (09/25/2022)   PRAPARE - Administrator, Civil Service (Medical): No    Lack of Transportation (Non-Medical): No  Physical Activity: Insufficiently Active (09/25/2022)   Exercise Vital Sign    Days of Exercise per Week: 2 days    Minutes of Exercise per Session: 60 min  Stress: No Stress Concern Present  (09/25/2022)   [employer] of Occupational Health - Occupational Stress Questionnaire    Feeling of Stress : Not at all  Social Connections: Moderately Isolated (09/25/2022)   Social Connection and Isolation Panel [NHANES]    Frequency of Communication with Friends and Family: More than three times a week    Frequency of Social Gatherings with Friends and Family: Three times a week    Attends Religious Services: More than 4 times per year    Active Member of Clubs or Organizations: No    Attends Banker Meetings: Never    Marital Status: Widowed    Tobacco Counseling Counseling given: Not Answered   Clinical Intake:  Pre-visit preparation completed: Yes  Pain : No/denies pain     Diabetes: Yes CBG done?: No Did pt. bring in CBG monitor from home?: No  How often do you need to have someone help you when you read instructions, pamphlets, or other written materials from your doctor or pharmacy?: 1 - Never  Diabetic?Yes   Nutrition Risk Assessment:  Has the patient had any N/V/D within the last 2 months?  No  Does the patient have any non-healing wounds?  No  Has the patient had any unintentional weight loss or weight gain?  No   Diabetes:  Is the patient diabetic?  Yes  If diabetic, was a CBG obtained today?  No  Did the patient bring in their glucometer from home?  No  How often do you monitor your CBG's? Daily and as needed.   Financial Strains and Diabetes Management:  Are you having any financial strains with the device, your supplies or your medication? No .  Does the patient want to be seen by Chronic Care Management for management of their diabetes?  No  Would the patient like to be referred to a Nutritionist or for Diabetic Management?  No   Diabetic Exams:  Diabetic Eye Exam: Completed records requested  Diabetic Foot Exam: Completed 01/01/22   Interpreter Needed?: No  Information entered by :: Kandis Fantasia LPN   Activities of  Daily Living    09/25/2022    2:20 PM 11/18/2021    1:49 PM  In your present state of health, do you have any difficulty performing the following activities:  Hearing? 0 0  Vision? 0 0  Difficulty concentrating or making decisions? 0 0  Walking or climbing stairs? 0 1  Comment  recent knee surgery  Dressing or bathing? 0 0  Doing errands, shopping? 0 0  Preparing Food and eating ? N N  Using the Toilet? N N  In the past six months, have you accidently leaked urine? N N  Do you have problems with loss of bowel control? N N  Managing your Medications? N N  Managing your Finances? N N  Housekeeping or managing your Housekeeping? N N    Patient Care  Team: Maury Dus, MD as PCP - General (Family Medicine) Croitoru, Rachelle Hora, MD as PCP - Cardiology (Cardiology) Storm Frisk, MD as Attending Physician (Pulmonary Disease) Carrington Clamp, DPM (Inactive) as Consulting Physician (Podiatry) Lucille Passy, MD as Referring Physician (Ophthalmology) Thurmon Fair, MD as Consulting Physician (Cardiology) Freddie Breech, DPM as Consulting Physician (Podiatry) [facility]  Indicate any recent Medical Services you may have received from other than [facility] providers in the past year (date may be approximate).     Assessment:   This is a routine wellness examination for [facility].  Hearing/Vision screen Hearing Screening - Comments:: Denies hearing difficulties   Vision Screening - Comments:: Wears rx glasses - up to date with routine eye exams with [facility]    Dietary issues and exercise activities discussed: Current Exercise Habits: Home exercise routine, Type of exercise: Other - see comments (stationary bike), Time (Minutes): 60, Frequency (Times/Week): 2, Weekly Exercise (Minutes/Week): 120, Intensity: Moderate   Goals Addressed               This Visit's Progress     COMPLETED: MSW Care Coordination (pt-stated)         Patient advise she is doing well and SW assistance isn't needed at this time.  Patient understands to contact PCP if additional services is needed.      Remain active and independent        Depression Screen    09/25/2022    1:14 PM 07/14/2022    9:43 AM 11/18/2021    1:45 PM 08/05/2021   10:18 AM 09/19/2020    8:42 AM 08/30/2020    8:51 AM 07/11/2020    8:53 AM  PHQ 2/9 Scores  PHQ - 2 Score 0 0 0 0 0 0 0  PHQ- 9 Score  0  0 0 0 0    Fall Risk    09/25/2022    2:20 PM 07/14/2022    9:43 AM 01/01/2022    9:04 AM 11/18/2021    1:48 PM 06/25/2020   10:12 AM  Fall Risk   Falls in the past year? 0 0 0 0 0  Number falls in past yr: 0 0 0 0 0  Injury with Fall? 0 0 0 0 0  Risk for fall due to : No Fall Risks   No Fall Risks   Follow up Falls prevention discussed;Education provided;Falls evaluation completed   Falls prevention discussed     FALL RISK PREVENTION PERTAINING TO THE HOME:  Any stairs in or around the home? Yes  If so, are there any without handrails? No  Home free of loose throw rugs in walkways, pet beds, electrical cords, etc? Yes  Adequate lighting in your home to reduce risk of falls? Yes   ASSISTIVE DEVICES UTILIZED TO PREVENT FALLS:  Life alert? No  Use of a cane, walker or w/c? No  Grab bars in the bathroom? Yes  Shower chair or bench in shower? No  Elevated toilet seat or a handicapped toilet? Yes   TIMED UP AND GO:  Was the test performed? No . Telephonic visit   Cognitive Function:        09/25/2022    2:20 PM 11/18/2021    1:51 PM  6CIT Screen  What Year? 0 points 0 points  What month? 0 points 0 points  What time? 0 points 0 points  Count back from 20 0 points 0 points  Months in reverse  0 points 0 points  Repeat phrase 0 points 0 points  Total Score 0 points 0 points    Immunizations Immunization History  Administered Date(s) Administered   Fluad Quad(high Dose 65+) 01/17/2019, 01/31/2020, 12/11/2020, 01/01/2022   H1N1 03/23/2008   Influenza  Split 01/06/2011, 01/18/2012   Influenza Whole 01/09/2008, 01/21/2009, 01/14/2010   Influenza,inj,Quad PF,6+ Mos 12/21/2012, 01/04/2014, 01/14/2015, 12/06/2015, 12/04/2016, 12/08/2017   Moderna Sars-Covid-2 Vaccination 05/17/2019, 06/14/2019, 02/09/2020   Pneumococcal Conjugate-13 09/25/2013   Pneumococcal Polysaccharide-23 01/11/2001, 02/22/2012, 04/08/2017   Td 01/11/2001   Tdap 02/22/2012   Zoster Recombinat (Shingrix) 10/12/2020, 12/12/2020    TDAP status: Due, Education has been provided regarding the importance of this vaccine. Advised may receive this vaccine at local pharmacy or Health Dept. Aware to provide a copy of the vaccination record if obtained from local pharmacy or Health Dept. Verbalized acceptance and understanding.  Pneumococcal vaccine status: Up to date  Covid-19 vaccine status: Information provided on how to obtain vaccines.   Qualifies for Shingles Vaccine? Yes   Zostavax completed No   Shingrix Completed?: Yes  Screening Tests Health Maintenance  Topic Date Due   Diabetic kidney evaluation - Urine ACR  09/01/2016   OPHTHALMOLOGY EXAM  03/11/2018   COVID-19 Vaccine (4 - 2023-24 season) 12/12/2021   DTaP/Tdap/Td (3 - Td or Tdap) 02/21/2022   HEMOGLOBIN A1C  10/13/2022   INFLUENZA VACCINE  11/12/2022   FOOT EXAM  01/02/2023   Diabetic kidney evaluation - eGFR measurement  08/24/2023   Medicare Annual Wellness (AWV)  09/25/2023   MAMMOGRAM  11/25/2023   Colonoscopy  10/29/2030   Pneumonia Vaccine 39+ Years old  Completed   DEXA SCAN  Completed   Hepatitis C Screening  Completed   Zoster Vaccines- Shingrix  Completed   HPV VACCINES  Aged Out    Health Maintenance  Health Maintenance Due  Topic Date Due   Diabetic kidney evaluation - Urine ACR  09/01/2016   OPHTHALMOLOGY EXAM  03/11/2018   COVID-19 Vaccine (4 - 2023-24 season) 12/12/2021   DTaP/Tdap/Td (3 - Td or Tdap) 02/21/2022    Colorectal cancer screening: Type of screening: Colonoscopy.  Completed 10/28/20. Repeat every 10 years  Mammogram status: Completed 11/24/21. Repeat every year (ordered today)  Bone Density status: Completed 12/31/16. Results reflect: Bone density results: OSTEOPENIA. Repeat every 2 years. (Ordered today)  Lung Cancer Screening: (Low Dose CT Chest recommended if Age 61-80 years, 30 pack-year currently smoking OR have quit w/in 15years.) does not qualify.   Lung Cancer Screening Referral: n/a  Additional Screening:  Hepatitis C Screening: does qualify; Completed 09/02/15  Vision Screening: Recommended annual ophthalmology exams for early detection of glaucoma and other disorders of the eye. Is the patient up to date with their annual eye exam?  Yes  Who is the provider or what is the name of the office in which the patient attends annual eye exams? [facility]  If pt is not established with a provider, would they like to be referred to a provider to establish care? No .   Dental Screening: Recommended annual dental exams for proper oral hygiene  Community Resource Referral / Chronic Care Management: CRR required this visit?  No   CCM required this visit?  No      Plan:     I have personally reviewed and noted the following in the patient's chart:   Medical and social history Use of alcohol, tobacco or illicit drugs  Current medications and supplements including opioid prescriptions. Patient  is not currently taking opioid prescriptions. Functional ability and status Nutritional status Physical activity Advanced directives List of other physicians Hospitalizations, surgeries, and ER visits in previous 12 months Vitals Screenings to include cognitive, depression, and falls Referrals and appointments  In addition, I have reviewed and discussed with patient certain preventive protocols, quality metrics, and best practice recommendations. A written personalized care plan for preventive services as well as general preventive health  recommendations were provided to patient.     Durwin Nora, [place]   09/08/4130   Due to this being a virtual visit, the after visit summary with patients personalized plan was offered to patient via mail or my-chart.  Patient would like to access on my-chart  Nurse Notes: No concerns      I have reviewed this visit and agree with the documentation.  Marshall L Chambliss

## 2022-09-25 NOTE — Addendum Note (Signed)
Addended by: Kandis Fantasia B on: 09/25/2022 01:55 PM   Modules accepted: Orders

## 2022-09-28 ENCOUNTER — Ambulatory Visit: Payer: Medicare HMO | Attending: Cardiovascular Disease

## 2022-09-28 DIAGNOSIS — I5032 Chronic diastolic (congestive) heart failure: Secondary | ICD-10-CM | POA: Diagnosis not present

## 2022-09-28 DIAGNOSIS — Z95810 Presence of automatic (implantable) cardiac defibrillator: Secondary | ICD-10-CM

## 2022-09-29 ENCOUNTER — Encounter: Payer: Self-pay | Admitting: Family Medicine

## 2022-09-29 ENCOUNTER — Ambulatory Visit: Payer: Medicare HMO | Admitting: Family Medicine

## 2022-09-29 VITALS — BP 123/82 | HR 84 | Ht 66.0 in | Wt 182.8 lb

## 2022-09-29 DIAGNOSIS — E1140 Type 2 diabetes mellitus with diabetic neuropathy, unspecified: Secondary | ICD-10-CM

## 2022-09-29 DIAGNOSIS — M8580 Other specified disorders of bone density and structure, unspecified site: Secondary | ICD-10-CM | POA: Diagnosis not present

## 2022-09-29 NOTE — Progress Notes (Signed)
    SUBJECTIVE:   CHIEF COMPLAINT / HPI:   Osteopenia Had medicare annual wellness visit few days ago and provider ordered DEXA scan. Hasn't gotten this scheduled yet. Last DEXA in 2018 showed osteopenia. Not currently taking calcium or vitamin D supplement. Patient unsure how much she gets in her diet. Goes to gym several days per week. Rides stationary bike. Walks. No falls or fractures.  Patient also wanted to say bye to me as PCP before graduation  PERTINENT  PMH / PSH: T2DM, HTN, pAF, s/p ICD  OBJECTIVE:   BP 123/82   Pulse 84   Ht 5\' 6"  (1.676 m)   Wt 182 lb 12.8 oz (82.9 kg)   LMP 07/26/2011   SpO2 100%   BMI 29.50 kg/m   Gen: NAD, pleasant, able to participate in exam HEENT: [place]/AT, PERRL, nares patent, oropharynx unremarkable Neck: supple, no lymphadenopathy CV: RRR, normal S1/S2, no murmur Resp: Normal effort, lungs CTAB Extremities: no edema or cyanosis Skin: warm and dry, no rashes noted Neuro: alert, no obvious focal deficits Psych: Normal affect and mood  ASSESSMENT/PLAN:   Osteopenia Diagnosed in 2018 based on DEXA with T-score of -1.5. FRAX calculator 4.8% risk of major osteoporotic fracture, 1% risk of hip fracture. -Advised 1200mg  calcium and 800IU vit D daily either through diet or supplement -Continue resistance training -F/u results of repeat DEXA   Diabetes Was going to obtain urine microalbumin today but patient unable to void. Recommend at next visit.  Maury Dus, MD [facility]

## 2022-09-29 NOTE — Patient Instructions (Addendum)
It was great to see you!!  We are checking some urine studies today. I will send you a MyChart message with the results.  You are due for the following vaccine: Tdap (tetanus booster). You can have this done at your pharmacy.  Because of your osteopenia (low bone density), be sure you're getting 1200mg  calcium daily and 800 international units of vitamin D. If you're not able to get this through your diet, I'd recommend a supplement. Continue doing resistance training at the gym as well. We will follow-up on your bone density test results after this is completed.  Follow up in ~1 month for diabetes visit with A1c check.  It was an absolute pleasure taking care of you! Best of luck!!  You will love Dr Barb Merino   -Dr Anner Crete

## 2022-09-30 NOTE — Progress Notes (Signed)
EPIC Encounter for ICM Monitoring  Patient Name: [name] is a 72 y.o. female Date: 09/30/2022 Primary Care Physican: Maury Dus, MD Primary Cardiologist: Croitoru Electrophysiologist: Croitoru 07/23/2022 Weight: 183-184 lbs 09/30/2022 Weight: 182 lbs                 Since 24-Aug-2022 AT/AF   103 Time in AT/AF   0.9 hr/day (3.8%) Longest AT/AF    5 hours                                  Spoke with patient and heart failure questions reviewed.  Transmission results reviewed.  Pt asymptomatic for fluid accumulation.  She reports feeling weak and generally not feeling well since Friday last week.  Advised of possible dryness and the report suggests she was out of rhythm off & on Saturday.     Optivol thoracic impedance suggesting normal fluid levels with the exception of possible dryness starting 6/12.   ATP on 07/27/22 and 6 month driving restriction.   Prescribed dosage:  Torsemide 20 mg 1 tablet by mouth daily.   Potassium 10 mEq 1 tablet by mouth by mouth daily.   Labs: 08/24/2022 Creatinine 1.34, BUN 19, Potassium 4.8, Sodium 139, GFR 42 05/05/2022 Creatinine 1.12, BUN 15, Potassium 4.5, Sodium 136 01/15/2022 Creatinine 1.28, BUN 16, Potassium 4.1, Sodium 138, GFR 45 11/27/2021 Creatinine 1.34, BUN 19, Potassium 4.8, Sodium 141, GFR 43 11/06/2021 Creatinine 1.43, BUN 18, Potassium 5.4, Sodium 138, GFR 39 04/29/2021 Creatinine 1.14, BUN 22, Potassium 4.0, Sodium 133, GFR 52 04/15/2021 Creatinine 1.26, BUN 21, Potassium 4.6, Sodium 138, GFR 46 A complete set of results can be found in Results Review.   Recommendations: Advised to drink 64 oz fluid to help with hydration and call back if she does not feel better.   No changes and encouraged to call if experiencing any fluid symptoms.   Follow-up plan: ICM clinic phone appointment on 11/02/2022.   91 day device clinic remote transmission 11/18/2022.     Next office visit scheduled:  11/19/2022 for Dr Royann Shivers.     Copy of ICM  check sent to Dr. Royann Shivers.    3 month ICM trend: 09/28/2022.    12-14 Month ICM trend:     [name], RN 09/30/2022 3:49 PM

## 2022-09-30 NOTE — Assessment & Plan Note (Addendum)
Diagnosed in 2018 based on DEXA with T-score of -1.5. FRAX calculator 4.8% risk of major osteoporotic fracture, 1% risk of hip fracture. -Advised 1200mg  calcium and 800IU vit D daily either through diet or supplement -Continue resistance training -F/u results of repeat DEXA

## 2022-10-05 ENCOUNTER — Other Ambulatory Visit: Payer: Self-pay | Admitting: Family Medicine

## 2022-10-05 ENCOUNTER — Other Ambulatory Visit: Payer: Self-pay

## 2022-10-05 ENCOUNTER — Other Ambulatory Visit (HOSPITAL_COMMUNITY): Payer: Self-pay

## 2022-10-05 MED ORDER — TRESIBA FLEXTOUCH 100 UNIT/ML ~~LOC~~ SOPN
40.0000 [IU] | PEN_INJECTOR | Freq: Every day | SUBCUTANEOUS | 2 refills | Status: DC
  Filled 2022-10-05: qty 15, 37d supply, fill #0
  Filled 2022-11-06: qty 15, 37d supply, fill #1
  Filled 2022-12-11: qty 15, 37d supply, fill #2

## 2022-10-12 ENCOUNTER — Other Ambulatory Visit (HOSPITAL_COMMUNITY): Payer: Self-pay

## 2022-10-12 ENCOUNTER — Other Ambulatory Visit: Payer: Self-pay | Admitting: Cardiovascular Disease

## 2022-10-12 ENCOUNTER — Other Ambulatory Visit (HOSPITAL_BASED_OUTPATIENT_CLINIC_OR_DEPARTMENT_OTHER): Payer: Self-pay | Admitting: Pulmonary Disease

## 2022-10-12 MED ORDER — POTASSIUM CHLORIDE CRYS ER 10 MEQ PO TBCR
10.0000 meq | EXTENDED_RELEASE_TABLET | Freq: Every day | ORAL | 0 refills | Status: DC
  Filled 2022-10-12: qty 30, 30d supply, fill #0

## 2022-10-13 ENCOUNTER — Other Ambulatory Visit: Payer: Self-pay

## 2022-10-13 ENCOUNTER — Other Ambulatory Visit (HOSPITAL_COMMUNITY): Payer: Self-pay

## 2022-10-14 ENCOUNTER — Other Ambulatory Visit (HOSPITAL_COMMUNITY): Payer: Self-pay

## 2022-10-16 DIAGNOSIS — G4733 Obstructive sleep apnea (adult) (pediatric): Secondary | ICD-10-CM | POA: Diagnosis not present

## 2022-10-16 DIAGNOSIS — J45909 Unspecified asthma, uncomplicated: Secondary | ICD-10-CM | POA: Diagnosis not present

## 2022-10-16 DIAGNOSIS — J449 Chronic obstructive pulmonary disease, unspecified: Secondary | ICD-10-CM | POA: Diagnosis not present

## 2022-10-16 MED ORDER — MONTELUKAST SODIUM 10 MG PO TABS
10.0000 mg | ORAL_TABLET | Freq: Every day | ORAL | 0 refills | Status: DC
  Filled 2022-10-16: qty 90, 90d supply, fill #0

## 2022-10-17 ENCOUNTER — Other Ambulatory Visit (HOSPITAL_COMMUNITY): Payer: Self-pay

## 2022-10-19 ENCOUNTER — Other Ambulatory Visit (HOSPITAL_COMMUNITY): Payer: Self-pay

## 2022-10-21 LAB — HM DIABETES EYE EXAM

## 2022-10-26 ENCOUNTER — Other Ambulatory Visit: Payer: Self-pay | Admitting: Psychology

## 2022-10-26 ENCOUNTER — Other Ambulatory Visit: Payer: Self-pay | Admitting: Family Medicine

## 2022-10-26 DIAGNOSIS — Z0000 Encounter for general adult medical examination without abnormal findings: Secondary | ICD-10-CM

## 2022-10-26 DIAGNOSIS — Z780 Asymptomatic menopausal state: Secondary | ICD-10-CM

## 2022-10-26 DIAGNOSIS — Z1231 Encounter for screening mammogram for malignant neoplasm of breast: Secondary | ICD-10-CM

## 2022-10-29 NOTE — Progress Notes (Signed)
SUBJECTIVE:   CHIEF COMPLAINT / HPI:   T2DM follow-up -Current medications include Farxiga 10 mg daily, Tresiba 40 units daily, Ozempic 2 mg weekly -Last A1c 7.5 in April 2024.  BMP in May showed creatinine 1.34 and GFR 42 in the setting of her CKD 3a -UACR and repeat A1c recommended for today's visit  -Reports medication compliance  -Home CBGs 90s-120s -On Losartan, Crestor without issue -Denies any med side effects, symptoms of hypoglycemia, CP/SOB, abd pain -Has lost some weight on ozempic, denies side effects, normal eating/drinking/voiding/stooling   PERTINENT  PMH / PSH:   Past Medical History:  Diagnosis Date   AICD (automatic cardioverter/defibrillator) present    Anemia    Anxiety    Arthritis    Arthritis of carpometacarpal (CMC) joint of thumb 03/26/2016   Asthma    has had multiple hospitalizations for this   Asthma, moderate persistent 12/10/2011   Atrial fibrillation (HCC)    ablation x 2 [facility], 01/2006, 2011.  on warfarin   Cardiac arrest (HCC) 04/2010   in hospital for pneumonia when this occured- occured at the hospital   CHF (congestive heart failure) (HCC) 2012   Echo 08/08/10 by [facility]. EF 35-45%. LV systolic function moderately reduced. Moderate global hypokinesis of LV.  RV systolic function moderately reduced. Mild MR. Trace AR.   Chronic diastolic heart failure (HCC) 01/07/2018   CKD (chronic kidney disease) stage 2, GFR 60-89 ml/min 02/22/2012   Her cr range 1.2-1.5 since Jan 2012 after hospitalization. 10/13 Estimated Creatinine Clearance: 69.3 ml/min (by C-G formula based on Cr of 1).     Complication of anesthesia    difficult time waking up after anesthesia   Depression    Diabetes mellitus    Type 2   Diabetic neuropathy (HCC) 09/18/2011   GASTROESOPHAGEAL REFLUX, NO ESOPHAGITIS 06/10/2006   Qualifier: Diagnosis of  By: Abundio Miu     GERD (gastroesophageal reflux disease) 03/23/2018   History of hiatal hernia    History  of kidney stones    HYPERCHOLESTEROLEMIA 06/10/2006   LDL 93 at 01/2011 check-  Continue pravastatin 20mg  po daily.  --discuss health modifications and possibly increaseing dose at next appt.  Cardiology- Dr little- stopped pravachol on 05/21/11- 2/2 allergies?     Hyperlipidemia    Hypertension    ICD (St. Jude Protecta dual-chamber),secondary prevention (VF arrest) January 2012 11/19/2012   Iliotibial band syndrome, left 11/01/2018   Limb pain 06/04/2008   LLE, Baker's cyst in popliteal fossa, no DVT   Long term current use of anticoagulant therapy 08/21/2015   Non-ischemic cardiomyopathy (HCC)    echo 08/08/10 - EF 35-45% LV and RV systolic function mod reduced   Obesity (BMI 30-39.9) 01/04/2014   OSA on CPAP 06/10/2006   Sleep study 02/2014 : severe apnea, corrected with nasal pillows and 8cmh2o     Osteoarthritis of right knee 08/28/2008   Qualifier: Diagnosis of  By: Pearletha Forge MD, Vincenza Hews     Primary localized osteoarthritis of right knee    Sleep apnea    wears CPAP nightly   Type 2 diabetes mellitus with diabetic neuropathy, with long-term current use of insulin (HCC) 06/10/2006              Ventricular tachycardia (HCC) 05/22/2015   Visit for monitoring Tikosyn therapy 08/21/2015   Vocal cord disease     OBJECTIVE:  BP 110/70   Pulse 89   Ht 5\' 6"  (1.676 m)   Wt 186 lb (84.4  kg)   LMP 07/26/2011   SpO2 99%   BMI 30.02 kg/m   General: NAD, pleasant, able to participate in exam Cardiac: RRR, no murmurs auscultated Respiratory: CTAB, normal WOB Abdomen: soft, non-tender, non-distended, normoactive bowel sounds Extremities: warm and well perfused, no edema or cyanosis Skin: warm and dry, no rashes noted Neuro: alert, no obvious focal deficits, speech normal Psych: Normal affect and mood  ASSESSMENT/PLAN:   Type 2 diabetes mellitus with diabetic neuropathy, with long-term current use of insulin (HCC) Assessment & Plan: Stable on current regimen - Farxiga 10 mg daily,  Tresiba 40 units daily, Ozempic 2 mg weekly. A1c 7.5, same as last visit. Home CBGs in appropriate range. Check UACR today. Continue current plan of care, f/u 71mo  Orders: -     Microalbumin / creatinine urine ratio -     POCT glycosylated hemoglobin (Hb A1C)   No orders of the defined types were placed in this encounter.  Return in about 6 months (around 05/02/2023).  [name], MD [facility]

## 2022-10-30 ENCOUNTER — Ambulatory Visit (INDEPENDENT_AMBULATORY_CARE_PROVIDER_SITE_OTHER): Payer: Medicare HMO | Admitting: Family Medicine

## 2022-10-30 ENCOUNTER — Encounter: Payer: Self-pay | Admitting: Family Medicine

## 2022-10-30 VITALS — BP 110/70 | HR 89 | Ht 66.0 in | Wt 186.0 lb

## 2022-10-30 DIAGNOSIS — Z794 Long term (current) use of insulin: Secondary | ICD-10-CM | POA: Diagnosis not present

## 2022-10-30 DIAGNOSIS — E1140 Type 2 diabetes mellitus with diabetic neuropathy, unspecified: Secondary | ICD-10-CM | POA: Diagnosis not present

## 2022-10-30 LAB — POCT GLYCOSYLATED HEMOGLOBIN (HGB A1C): HbA1c, POC (controlled diabetic range): 7.5 % — AB (ref 0.0–7.0)

## 2022-10-30 NOTE — Assessment & Plan Note (Signed)
Stable on current regimen - Farxiga 10 mg daily, Tresiba 40 units daily, Ozempic 2 mg weekly. A1c 7.5, same as last visit. Home CBGs in appropriate range. Check UACR today. Continue current plan of care, f/u 35mo

## 2022-10-30 NOTE — Patient Instructions (Signed)
It was wonderful to see you today.  Please bring ALL of your medications with you to every visit.   Updates from today's visit:  Continue doing what you're doing! Your A1c is 7.5 which is stable from the last visit. We will continue to check in periodically. Please let us know if you have any concerns in the meantime.  Please follow up in 6 months   Thank you for choosing [facility].   Please call [phone] with any questions about today's appointment.  Please be sure to schedule follow up at the front  desk before you leave today.   [name], MD  Family Medicine

## 2022-10-31 LAB — MICROALBUMIN / CREATININE URINE RATIO
Creatinine, Urine: 116.2 mg/dL
Microalb/Creat Ratio: 3 mg/g creat (ref 0–29)
Microalbumin, Urine: 3.3 ug/mL

## 2022-11-02 ENCOUNTER — Other Ambulatory Visit (HOSPITAL_COMMUNITY): Payer: Self-pay

## 2022-11-02 ENCOUNTER — Ambulatory Visit: Payer: Medicare HMO | Attending: Cardiovascular Disease

## 2022-11-02 DIAGNOSIS — Z95810 Presence of automatic (implantable) cardiac defibrillator: Secondary | ICD-10-CM

## 2022-11-02 DIAGNOSIS — I5032 Chronic diastolic (congestive) heart failure: Secondary | ICD-10-CM

## 2022-11-03 ENCOUNTER — Ambulatory Visit: Payer: Medicare HMO | Admitting: Podiatry

## 2022-11-04 NOTE — Progress Notes (Signed)
EPIC Encounter for ICM Monitoring  Patient Name: ASHELYNN MARKS is a 72 y.o. female Date: 11/04/2022 Primary Care Physican: [name], MD Primary Cardiologist: Croitoru Electrophysiologist: Croitoru 07/23/2022 Weight: 183-184 lbs 09/30/2022 Weight: 182 lbs 11/04/2022 Weight: 182-184 lbs                 Since 28-Sep-2022 AT/AF  80 Time in AT/AF  0.5 hr/day (2.1%) Longest AT/AF   88 minutes                                  Spoke with patient and heart failure questions reviewed.  Transmission results reviewed.  Pt asymptomatic for fluid accumulation.  Reports feeling well at this time and voices no complaints.     Optivol thoracic impedance suggesting normal fluid levels with the exception of possible dryness starting 10/26/2022-7/22.     Prescribed dosage:  Torsemide 20 mg 1 tablet by mouth daily.   Potassium 10 mEq 1 tablet by mouth by mouth daily.   Labs: 08/24/2022 Creatinine 1.34, BUN 19, Potassium 4.8, Sodium 139, GFR 42 05/05/2022 Creatinine 1.12, BUN 15, Potassium 4.5, Sodium 136 01/15/2022 Creatinine 1.28, BUN 16, Potassium 4.1, Sodium 138, GFR 45 11/27/2021 Creatinine 1.34, BUN 19, Potassium 4.8, Sodium 141, GFR 43 11/06/2021 Creatinine 1.43, BUN 18, Potassium 5.4, Sodium 138, GFR 39 04/29/2021 Creatinine 1.14, BUN 22, Potassium 4.0, Sodium 133, GFR 52 04/15/2021 Creatinine 1.26, BUN 21, Potassium 4.6, Sodium 138, GFR 46 A complete set of results can be found in Results Review.   Recommendations:   No changes and encouraged to call if experiencing any fluid symptoms.   Follow-up plan: ICM clinic phone appointment on 12/07/2022.   91 day device clinic remote transmission 11/18/2022.     Next office visit scheduled:  11/19/2022 for Dr Royann Shivers.     Copy of ICM check sent to Dr. Royann Shivers.    3 month ICM trend: 11/02/2022.    12-14 Month ICM trend:     [name], RN 11/04/2022 2:00 PM

## 2022-11-06 ENCOUNTER — Other Ambulatory Visit: Payer: Self-pay | Admitting: Cardiovascular Disease

## 2022-11-06 ENCOUNTER — Other Ambulatory Visit (HOSPITAL_COMMUNITY): Payer: Self-pay

## 2022-11-06 MED ORDER — DOFETILIDE 250 MCG PO CAPS
250.0000 ug | ORAL_CAPSULE | Freq: Two times a day (BID) | ORAL | 2 refills | Status: DC
  Filled 2022-11-06: qty 180, 90d supply, fill #0
  Filled 2023-02-02: qty 180, 90d supply, fill #1
  Filled 2023-05-05: qty 180, 90d supply, fill #2

## 2022-11-06 MED ORDER — POTASSIUM CHLORIDE CRYS ER 10 MEQ PO TBCR
10.0000 meq | EXTENDED_RELEASE_TABLET | Freq: Every day | ORAL | 2 refills | Status: DC
  Filled 2022-11-06: qty 90, 90d supply, fill #0
  Filled 2023-02-02: qty 90, 90d supply, fill #1
  Filled 2023-05-11: qty 90, 90d supply, fill #2

## 2022-11-06 NOTE — Telephone Encounter (Signed)
This is a A-Fib clinic pt. Pt was seen last by [facility] clinic

## 2022-11-07 ENCOUNTER — Other Ambulatory Visit (HOSPITAL_COMMUNITY): Payer: Self-pay

## 2022-11-09 ENCOUNTER — Ambulatory Visit: Payer: Medicare HMO | Admitting: Podiatry

## 2022-11-09 ENCOUNTER — Encounter: Payer: Self-pay | Admitting: Podiatry

## 2022-11-09 DIAGNOSIS — E1142 Type 2 diabetes mellitus with diabetic polyneuropathy: Secondary | ICD-10-CM | POA: Diagnosis not present

## 2022-11-09 DIAGNOSIS — M79609 Pain in unspecified limb: Secondary | ICD-10-CM

## 2022-11-09 DIAGNOSIS — B351 Tinea unguium: Secondary | ICD-10-CM | POA: Diagnosis not present

## 2022-11-09 DIAGNOSIS — L84 Corns and callosities: Secondary | ICD-10-CM | POA: Diagnosis not present

## 2022-11-09 NOTE — Progress Notes (Signed)
Subjective:  Patient ID: [name], female    DOB: June 26, 1950,   MRN: [id number]  No chief complaint on file.   72 y.o. female presents for concern of thickened elongated and painful nails that are difficult to trim. Requesting to have them trimmed today. Relates burning and tingling in their feet. Patient is diabetic and last A1c was  Lab Results  Component Value Date   HGBA1C 7.5 (A) 10/30/2022   .   PCP:  [name], MD    . Denies any other pedal complaints. Denies n/v/f/c.   Past Medical History:  Diagnosis Date   AICD (automatic cardioverter/defibrillator) present    Anemia    Anxiety    Arthritis    Arthritis of carpometacarpal (CMC) joint of thumb 03/26/2016   Asthma    has had multiple hospitalizations for this   Asthma, moderate persistent 12/10/2011   Atrial fibrillation (HCC)    ablation x 2 [facility], 01/2006, 2011.  on warfarin   Cardiac arrest (HCC) 04/2010   in hospital for pneumonia when this occured- occured at the hospital   CHF (congestive heart failure) (HCC) 2012   Echo 08/08/10 by [facility]. EF 35-45%. LV systolic function moderately reduced. Moderate global hypokinesis of LV.  RV systolic function moderately reduced. Mild MR. Trace AR.   Chronic diastolic heart failure (HCC) 01/07/2018   CKD (chronic kidney disease) stage 2, GFR 60-89 ml/min 02/22/2012   Her cr range 1.2-1.5 since Jan 2012 after hospitalization. 10/13 Estimated Creatinine Clearance: 69.3 ml/min (by C-G formula based on Cr of 1).     Complication of anesthesia    difficult time waking up after anesthesia   Depression    Diabetes mellitus    Type 2   Diabetic neuropathy (HCC) 09/18/2011   GASTROESOPHAGEAL REFLUX, NO ESOPHAGITIS 06/10/2006   Qualifier: Diagnosis of  By: Abundio Miu     GERD (gastroesophageal reflux disease) 03/23/2018   History of hiatal hernia    History of kidney stones    HYPERCHOLESTEROLEMIA 06/10/2006   LDL 93 at 01/2011 check-  Continue  pravastatin 20mg  po daily.  --discuss health modifications and possibly increaseing dose at next appt.  Cardiology- Dr little- stopped pravachol on 05/21/11- 2/2 allergies?     Hyperlipidemia    Hypertension    ICD (St. Jude Protecta dual-chamber),secondary prevention (VF arrest) January 2012 11/19/2012   Iliotibial band syndrome, left 11/01/2018   Limb pain 06/04/2008   LLE, Baker's cyst in popliteal fossa, no DVT   Long term current use of anticoagulant therapy 08/21/2015   Non-ischemic cardiomyopathy (HCC)    echo 08/08/10 - EF 35-45% LV and RV systolic function mod reduced   Obesity (BMI 30-39.9) 01/04/2014   OSA on CPAP 06/10/2006   Sleep study 02/2014 : severe apnea, corrected with nasal pillows and 8cmh2o     Osteoarthritis of right knee 08/28/2008   Qualifier: Diagnosis of  By: Pearletha Forge MD, Vincenza Hews     Primary localized osteoarthritis of right knee    Sleep apnea    wears CPAP nightly   Type 2 diabetes mellitus with diabetic neuropathy, with long-term current use of insulin (HCC) 06/10/2006              Ventricular tachycardia (HCC) 05/22/2015   Visit for monitoring Tikosyn therapy 08/21/2015   Vocal cord disease     Objective:  Physical Exam: Vascular: DP/PT pulses 2/4 bilateral. CFT <3 seconds. Absent hair growth on digits. Edema noted to bilateral lower extremities. Xerosis  noted bilaterally.  Skin. No lacerations or abrasions bilateral feet. Nails 1-5 bilateral  are thickened discolored and elongated with subungual debris. Hyperkeratotic lesions noted bilateral heels and bilateral first metatarasal second and third metatarsal. Hyperkeratosis noted to distal tips of second and third digits as well.  Musculoskeletal: MMT 5/5 bilateral lower extremities in DF, PF, Inversion and Eversion. Deceased ROM in DF of ankle joint.  Neurological: Sensation intact to light touch. Protective sensation diminished bilateral.    Assessment:   1. Pain due to onychomycosis of nail   2. Corns  and callosities   3. Diabetic polyneuropathy associated with type 2 diabetes mellitus (HCC)      Plan:  Patient was evaluated and treated and all questions answered. -Discussed and educated patient on diabetic foot care, especially with  regards to the vascular, neurological and musculoskeletal systems.  -Stressed the importance of good glycemic control and the detriment of not  controlling glucose levels in relation to the foot. -Discussed supportive shoes at all times and checking feet regularly.  -Mechanically debrided all nails 1-5 bilateral using sterile nail nipper and filed with dremel without incident  -hyperkeratotic tissue debrided with chisel without incident x 8 -Answered all patient questions -Patient to return  in 3 months for at risk foot care -Patient advised to call the office if any problems or questions arise in the meantime.   Louann Sjogren, DPM

## 2022-11-10 ENCOUNTER — Other Ambulatory Visit (HOSPITAL_COMMUNITY): Payer: Self-pay

## 2022-11-10 ENCOUNTER — Other Ambulatory Visit: Payer: Self-pay | Admitting: Family Medicine

## 2022-11-10 DIAGNOSIS — E1140 Type 2 diabetes mellitus with diabetic neuropathy, unspecified: Secondary | ICD-10-CM

## 2022-11-10 MED ORDER — DAPAGLIFLOZIN PROPANEDIOL 10 MG PO TABS
10.0000 mg | ORAL_TABLET | Freq: Every day | ORAL | 0 refills | Status: DC
  Filled 2022-11-10: qty 90, 90d supply, fill #0

## 2022-11-18 ENCOUNTER — Ambulatory Visit (INDEPENDENT_AMBULATORY_CARE_PROVIDER_SITE_OTHER): Payer: Medicare HMO

## 2022-11-18 DIAGNOSIS — I472 Ventricular tachycardia, unspecified: Secondary | ICD-10-CM

## 2022-11-18 LAB — CUP PACEART REMOTE DEVICE CHECK
Battery Remaining Longevity: 36 mo
Battery Voltage: 2.95 V
Brady Statistic AP VP Percent: 1.52 %
Brady Statistic AP VS Percent: 93.68 %
Brady Statistic AS VP Percent: 0.07 %
Brady Statistic AS VS Percent: 4.72 %
Brady Statistic RA Percent Paced: 90.07 %
Brady Statistic RV Percent Paced: 1.73 %
Date Time Interrogation Session: 20240807033323
HighPow Impedance: 76 Ohm
Implantable Lead Connection Status: 753985
Implantable Lead Connection Status: 753985
Implantable Lead Implant Date: 20120120
Implantable Lead Implant Date: 20120120
Implantable Lead Location: 753859
Implantable Lead Location: 753860
Implantable Lead Model: 5076
Implantable Lead Model: 6935
Implantable Pulse Generator Implant Date: 20190618
Lead Channel Impedance Value: 342 Ohm
Lead Channel Impedance Value: 437 Ohm
Lead Channel Impedance Value: 437 Ohm
Lead Channel Pacing Threshold Amplitude: 0.5 V
Lead Channel Pacing Threshold Amplitude: 0.75 V
Lead Channel Pacing Threshold Pulse Width: 0.4 ms
Lead Channel Pacing Threshold Pulse Width: 0.4 ms
Lead Channel Sensing Intrinsic Amplitude: 0.625 mV
Lead Channel Sensing Intrinsic Amplitude: 0.625 mV
Lead Channel Sensing Intrinsic Amplitude: 11 mV
Lead Channel Sensing Intrinsic Amplitude: 11 mV
Lead Channel Setting Pacing Amplitude: 2 V
Lead Channel Setting Pacing Amplitude: 2.5 V
Lead Channel Setting Pacing Pulse Width: 0.4 ms
Lead Channel Setting Sensing Sensitivity: 0.3 mV
Zone Setting Status: 755011
Zone Setting Status: 755011

## 2022-11-19 ENCOUNTER — Encounter: Payer: Self-pay | Admitting: Cardiovascular Disease

## 2022-11-19 ENCOUNTER — Ambulatory Visit: Payer: Medicare HMO | Attending: Cardiovascular Disease | Admitting: Cardiovascular Disease

## 2022-11-19 VITALS — BP 96/62 | HR 87 | Ht 66.0 in | Wt 185.0 lb

## 2022-11-19 DIAGNOSIS — I480 Paroxysmal atrial fibrillation: Secondary | ICD-10-CM

## 2022-11-19 DIAGNOSIS — N1831 Chronic kidney disease, stage 3a: Secondary | ICD-10-CM

## 2022-11-19 DIAGNOSIS — G4733 Obstructive sleep apnea (adult) (pediatric): Secondary | ICD-10-CM | POA: Diagnosis not present

## 2022-11-19 DIAGNOSIS — D6869 Other thrombophilia: Secondary | ICD-10-CM

## 2022-11-19 DIAGNOSIS — E663 Overweight: Secondary | ICD-10-CM | POA: Diagnosis not present

## 2022-11-19 DIAGNOSIS — Z95810 Presence of automatic (implantable) cardiac defibrillator: Secondary | ICD-10-CM

## 2022-11-19 DIAGNOSIS — Z794 Long term (current) use of insulin: Secondary | ICD-10-CM

## 2022-11-19 DIAGNOSIS — I472 Ventricular tachycardia, unspecified: Secondary | ICD-10-CM

## 2022-11-19 DIAGNOSIS — Z5181 Encounter for therapeutic drug level monitoring: Secondary | ICD-10-CM | POA: Diagnosis not present

## 2022-11-19 DIAGNOSIS — E7800 Pure hypercholesterolemia, unspecified: Secondary | ICD-10-CM

## 2022-11-19 DIAGNOSIS — J4540 Moderate persistent asthma, uncomplicated: Secondary | ICD-10-CM

## 2022-11-19 DIAGNOSIS — I5032 Chronic diastolic (congestive) heart failure: Secondary | ICD-10-CM

## 2022-11-19 DIAGNOSIS — I10 Essential (primary) hypertension: Secondary | ICD-10-CM

## 2022-11-19 DIAGNOSIS — Z79899 Other long term (current) drug therapy: Secondary | ICD-10-CM

## 2022-11-19 DIAGNOSIS — E1140 Type 2 diabetes mellitus with diabetic neuropathy, unspecified: Secondary | ICD-10-CM | POA: Diagnosis not present

## 2022-11-19 NOTE — Patient Instructions (Signed)
Medication Instructions:   Your physician recommends that you continue on your current medications as directed. Please refer to the Current Medication list given to you today.   *If you need a refill on your cardiac medications before your next appointment, please call your pharmacy*   Lab Work:  BMET  IN [facility]   If you have labs (blood work) drawn today and your tests are completely normal, you will receive your results only by: MyChart Message (if you have MyChart) OR A paper copy in the mail If you have any lab test that is abnormal or we need to change your treatment, we will call you to review the results.   Testing/Procedures: NONE ORDERED  TODAY     Follow-Up: At [facility], you and your health needs are our priority.  As part of our continuing mission to provide you with exceptional heart care, we have created designated Provider Care Teams.  These Care Teams include your primary Cardiologist (physician) and Advanced Practice Providers (APPs -  Physician Assistants and Nurse Practitioners) who all work together to provide you with the care you need, when you need it.  We recommend signing up for the patient portal called "MyChart".  Sign up information is provided on this After Visit Summary.  MyChart is used to connect with patients for Virtual Visits (Telemedicine).  Patients are able to view lab/test results, encounter notes, upcoming appointments, etc.  Non-urgent messages can be sent to your provider as well.   To learn more about what you can do with MyChart, go to [email or web address].    Your next appointment:   6 month(s)  Provider:   Thurmon Fair, MD  Other Instructions

## 2022-11-19 NOTE — Progress Notes (Signed)
Patient ID: [name], female   DOB: 12-08-1950, 72 y.o.   MRN: [id number] Patient ID: [name], female   DOB: [birth year], 72 y.o.   MRN: [id number]    Cardiology Office Note    Date:  11/19/2022   ID:  [name], DOB 11-Dec-1950, MRN [id number]  PCP:  [name], MD  Cardiologist:   Thurmon Fair, MD   Chief Complaint  Patient presents with   Congestive Heart Failure   ICD check    History of Present Illness:  [name] is a 72 y.o. female with a long-standing history of nonischemic cardiomyopathy, chronic heart failure with recovered LV systolic function and paroxysmal atrial fibrillation, s/p ICD (Medtronic, gen change 2019), with appropriate previous treatment for rapid monomorphic ventricular tachycardia.    She had appropriate intervention from her defibrillator on April 15 when she had a sustained episode of very rapid monomorphic VT at about 222 bpm, successfully terminated with ATP during charge.  She continues to have occasional episodes of brief nonsustained ventricular tachycardia, consistently with very rapid rates (cycle length usually around 270-290 ms).  These have been asymptomatic.  Labs checked after the arrhythmia was detected were within normal range.  She is on chronic treatment with dofetilide.  QTc is around 500 ms and stable.  She is otherwise doing quite well.  She is recovering strength in her legs after undergoing successful knee replacement surgery in January.  She goes to the gym 3 to 4 days a week and this has helped her lose weight.  She does both upper body weight exercises and rides a stationary bicycle.  She does not have dyspnea on exertion, orthopnea, PND, palpitations, dizziness or syncope.  Reports compliance with CPAP.  Denies hypersomnolence during the daytime.  Device recorded activity is steady at around 2.5-3 hours a day.  She did have an episode of GI upset in June.  Her ICD shows that she had a brief sudden increase in thoracic  impedance consistent with the fact that she was getting "dry" and around the same time she had a 5-hour episode of paroxysmal atrial fibrillation, the longest episode she has had in a while.  Otherwise her atrial fibrillation burden is extremely low at only 1-2%.  She has 96% atrial pacing with heart rate histogram distribution and only 1.5% ventricular pacing.  Estimated generator longevity is about 3 years, all lead parameters are normal.  ECG today shows atrial paced, ventricular sensed rhythm with a couple of PVCs and QTc that is borderline at 508 ms on dofetilide.  Metabolic control was good.  Her hemoglobin A1c was recently 7.5% (was a little better last time at 6.9%).  She is on metformin, GLP-1 agonist and SGLT2 inhibitor.  Her lipid parameters are all good (HDL 55, LDL 42, triglycerides 57).  She had antitachycardia pacing for ventricular tachycardia in December 2016 and again in April 2022 and April 2024.  Otherwise she occasional brief bursts of nonsustained VT.. On July 19, 2020 and July 27, 2022 she had very fast VT (CL 280 ms), mostly monomorphic.  Each time the arrhythmia was terminated with ATP.    She has a history of moderate nonischemic cardiomyopathy (no coronary disease by cardiac catheterization January 2012), recurrent paroxysmal atrial fibrillation status post 2 ablation procedures (Dr. Orson Aloe), with history of ventricular fibrillation arrest while on treatment with diltiazem and dofetilide and a prolonged QT interval, probably related to simultaneous quinolone therapy. She has a dual-chamber Medtronic Protecta defibrillator implanted  in January 2012. In December 2016 her defibrillator recorded an episode of sustained monomorphic ventricular tachycardia. Antitachycardia pacing was unsuccessful at converting the arrhythmia, but she had a "dirty break" before defibrillator shock was delivered. The shock was aborted. Polymorphic VT lasting 2 seconds was recorded in the fall of  2017 during asthma exacerbation, but did not require intervention. Had sustained monomorphic VT on July 19, 2020. ATP successfully terminated the arrhythmia.  Most recent echo June 2020 showed normal LVEF 55-60%, but reduced GLS -14%. At LV angiography in 2012, EF was 15-20%.  Past Medical History:  Diagnosis Date   AICD (automatic cardioverter/defibrillator) present    Anemia    Anxiety    Arthritis    Arthritis of carpometacarpal (CMC) joint of thumb 03/26/2016   Asthma    has had multiple hospitalizations for this   Asthma, moderate persistent 12/10/2011   Atrial fibrillation (HCC)    ablation x 2 [facility], 01/2006, 2011.  on warfarin   Cardiac arrest (HCC) 04/2010   in hospital for pneumonia when this occured- occured at the hospital   CHF (congestive heart failure) (HCC) 2012   Echo 08/08/10 by [facility]. EF 35-45%. LV systolic function moderately reduced. Moderate global hypokinesis of LV.  RV systolic function moderately reduced. Mild MR. Trace AR.   Chronic diastolic heart failure (HCC) 01/07/2018   CKD (chronic kidney disease) stage 2, GFR 60-89 ml/min 02/22/2012   Her cr range 1.2-1.5 since Jan 2012 after hospitalization. 10/13 Estimated Creatinine Clearance: 69.3 ml/min (by C-G formula based on Cr of 1).     Complication of anesthesia    difficult time waking up after anesthesia   Depression    Diabetes mellitus    Type 2   Diabetic neuropathy (HCC) 09/18/2011   GASTROESOPHAGEAL REFLUX, NO ESOPHAGITIS 06/10/2006   Qualifier: Diagnosis of  By: Abundio Miu     GERD (gastroesophageal reflux disease) 03/23/2018   History of hiatal hernia    History of kidney stones    HYPERCHOLESTEROLEMIA 06/10/2006   LDL 93 at 01/2011 check-  Continue pravastatin 20mg  po daily.  --discuss health modifications and possibly increaseing dose at next appt.  Cardiology- Dr little- stopped pravachol on 05/21/11- 2/2 allergies?     Hyperlipidemia    Hypertension    ICD (St. Jude  Protecta dual-chamber),secondary prevention (VF arrest) January 2012 11/19/2012   Iliotibial band syndrome, left 11/01/2018   Limb pain 06/04/2008   LLE, Baker's cyst in popliteal fossa, no DVT   Long term current use of anticoagulant therapy 08/21/2015   Non-ischemic cardiomyopathy (HCC)    echo 08/08/10 - EF 35-45% LV and RV systolic function mod reduced   Obesity (BMI 30-39.9) 01/04/2014   OSA on CPAP 06/10/2006   Sleep study 02/2014 : severe apnea, corrected with nasal pillows and 8cmh2o     Osteoarthritis of right knee 08/28/2008   Qualifier: Diagnosis of  By: Pearletha Forge MD, Vincenza Hews     Primary localized osteoarthritis of right knee    Sleep apnea    wears CPAP nightly   Type 2 diabetes mellitus with diabetic neuropathy, with long-term current use of insulin (HCC) 06/10/2006              Ventricular tachycardia (HCC) 05/22/2015   Visit for monitoring Tikosyn therapy 08/21/2015   Vocal cord disease     Past Surgical History:  Procedure Laterality Date   BREAST EXCISIONAL BIOPSY Right 1999   BREAST LUMPECTOMY Right    CARDIAC DEFIBRILLATOR PLACEMENT  05/02/10   Medtronic Protecta XT-DR for CHF-VT, last download 04/12/12   CHOLECYSTECTOMY     COLONOSCOPY     HAMMER TOE SURGERY Right    ICD GENERATOR CHANGEOUT N/A 09/28/2017   Procedure: ICD GENERATOR CHANGEOUT;  Surgeon: Thurmon Fair, MD;  Location: [facility];  Service: Cardiovascular;  Laterality: N/A;   KNEE ARTHROSCOPY Right    PAF Ablation     By Dr Sampson Goon. Now sees Dr Steele [facility] at [facility]   TOTAL KNEE ARTHROPLASTY Left 09/17/2014   Procedure: TOTAL KNEE ARTHROPLASTY;  Surgeon: Salvatore Marvel, MD;  Location: [facility] OR;  Service: Orthopedics;  Laterality: Left;  pt has ICD   TOTAL KNEE ARTHROPLASTY Right 04/28/2021   Procedure: TOTAL KNEE ARTHROPLASTY;  Surgeon: Joen Laura, MD;  Location: [facility] ORS;  Service: Orthopedics;  Laterality: Right;   TUBAL LIGATION      Outpatient Medications Prior to Visit  Medication  Sig Dispense Refill   apixaban (ELIQUIS) 5 MG TABS tablet Take 1 tablet (5 mg total) by mouth 2 (two) times daily. 180 tablet 1   bisoprolol (ZEBETA) 10 MG tablet Take 1 tablet (10 mg total) by mouth in the morning and at bedtime. 180 tablet 3   budesonide-formoterol (SYMBICORT) 80-4.5 MCG/ACT inhaler INHALE 2 PUFFS INTO THE LUNGS IN THE MORNING AND AT BEDTIME 10.2 g 11   buPROPion (WELLBUTRIN XL) 150 MG 24 hr tablet Take 1 tablet (150 mg total) by mouth daily. 90 tablet 3   dapagliflozin propanediol (FARXIGA) 10 MG TABS tablet Take 1 tablet (10 mg total) by mouth daily. 90 tablet 0   diclofenac Sodium (VOLTAREN) 1 % GEL Apply 2 g topically 4 (four) times daily. 100 g 1   dofetilide (TIKOSYN) 250 MCG capsule Take 1 capsule (250 mcg total) by mouth 2 (two) times daily. 180 capsule 2   famotidine (PEPCID) 20 MG tablet Take 1 tablet (20 mg total) by mouth daily. 90 tablet 3   fluticasone (FLONASE) 50 MCG/ACT nasal spray instill 1 spray into each nostril twice a day 16 g 5   glucose blood test strip Use as instructed 100 each 12   insulin degludec (TRESIBA FLEXTOUCH) 100 UNIT/ML FlexTouch Pen Inject 40 Units into the skin daily. 15 mL 2   Insulin Pen Needle (TECHLITE PEN NEEDLES) 32G X 4 MM MISC Use as directed with insulin once daily 100 each 2   levalbuterol (XOPENEX HFA) 45 MCG/ACT inhaler Inhale 1-2 puffs into the lungs every 4 (four) hours as needed for wheezing 15 g 1   losartan (COZAAR) 25 MG tablet Take 1 tablet (25 mg total) by mouth at bedtime. 90 tablet 0   montelukast (SINGULAIR) 10 MG tablet Take 1 tablet (10 mg) by mouth at bedtime. 90 tablet 0   potassium chloride (KLOR-CON M) 10 MEQ tablet Take 1 tablet (10 mEq total) by mouth daily as directed 90 tablet 2   PRODIGY LANCETS 28G MISC 1 Units by Does not apply route 4 (four) times daily. 100 each 4   rosuvastatin (CRESTOR) 10 MG tablet Take 1 tablet (10 mg total) by mouth daily. 90 tablet 2   Semaglutide, 2 MG/DOSE, (OZEMPIC, 2 MG/DOSE,)  8 MG/3ML SOPN Inject 2 mg into the skin once a week. 3 mL 2   torsemide (DEMADEX) 20 MG tablet Take 1 tablet (20 mg total) by mouth daily. 90 tablet 1   acetaminophen (TYLENOL 8 HOUR) 650 MG CR tablet Take 1 tablet (650 mg total) by mouth every 8 (eight) hours  as needed for pain. (Patient not taking: Reported on 11/19/2022) 90 tablet 2   No facility-administered medications prior to visit.     Allergies:   Avelox [moxifloxacin hcl in nacl], Nsaids, Simvastatin, Ace inhibitors, Jardiance [empagliflozin], and Latex   Social History   Socioeconomic History   Marital status: Widowed    Spouse name: Not on file   Number of children: 3   Years of education: 10   Highest education level: 10th grade  Occupational History   Occupation: Retired  Tobacco Use   Smoking status: Never    Passive exposure: Never   Smokeless tobacco: Never  Vaping Use   Vaping status: Never Used  Substance and Sexual Activity   Alcohol use: No   Drug use: No   Sexual activity: Not Currently  Other Topics Concern   Not on file  Social History Narrative      [facility]:   She is originally from [place]. Has always lived in [place]. 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 Currently has a small dog. No bird or mold exposure.       Current Social History August 2023      Patient lives with daughter, Lafonda Mosses, in two level home    One son in [place] and another daughter in [place]    Patient is a widow   Transportation: Patient has own vehicle and drives herself    Important Relationships: Daughter, Lafonda Mosses     Education: 10 th grade   Interests Fun: Read, do puzzles    Religious/Personal Beliefs: Non-Denominational                                                                                                Patient exercises 2x per week using stationary bike for ~45 minutes   Social Determinants of Health    Financial Resource Strain: Low Risk  (09/25/2022)   Overall Financial Resource Strain (CARDIA)    Difficulty of Paying Living Expenses: Not hard at all  Food Insecurity: No Food Insecurity (09/25/2022)   Hunger Vital Sign    Worried About Running Out of Food in the Last Year: Never true    Ran Out of Food in the Last Year: Never true  Transportation Needs: No Transportation Needs (09/25/2022)   PRAPARE - Administrator, Civil Service (Medical): No    Lack of Transportation (Non-Medical): No  Physical Activity: Insufficiently Active (09/25/2022)   Exercise Vital Sign    Days of Exercise per Week: 2 days    Minutes of Exercise per Session: 60 min  Stress: No Stress Concern Present (09/25/2022)   [employer] of Occupational Health - Occupational Stress Questionnaire    Feeling of Stress : Not at all  Social Connections: Moderately Isolated (09/25/2022)   Social Connection and Isolation Panel [NHANES]    Frequency of Communication with Friends and Family: More than three times a week    Frequency of Social Gatherings with Friends and Family: Three times a week  Attends Religious Services: More than 4 times per year    Active Member of Clubs or Organizations: No    Attends Banker Meetings: Never    Marital Status: Widowed     Family History:  The patient's family history includes Diabetes in her brother and father; Hypertension in her brother and father.   ROS:   Please see the history of present illness.    Otherwise negative ROS  PHYSICAL EXAM:   VS:  BP 96/62 (BP Location: Left Arm, Patient Position: Sitting, Cuff Size: Large)   Pulse 87   Ht 5\' 6"  (1.676 m)   Wt 185 lb (83.9 kg)   LMP 07/26/2011   SpO2 97%   BMI 29.86 kg/m      General: Alert, oriented x3, no distress, healthy left subclavian defibrillator site. Head: no evidence of trauma, PERRL, EOMI, no exophtalmos or lid lag, no myxedema, no xanthelasma; normal ears, nose and  oropharynx Neck: normal jugular venous pulsations and no hepatojugular reflux; brisk carotid pulses without delay and no carotid bruits Chest: clear to auscultation, no signs of consolidation by percussion or palpation, normal fremitus, symmetrical and full respiratory excursions Cardiovascular: normal position and quality of the apical impulse, regular rhythm, normal first and second heart sounds, no murmurs, rubs or gallops Abdomen: no tenderness or distention, no masses by palpation, no abnormal pulsatility or arterial bruits, normal bowel sounds, no hepatosplenomegaly Extremities: no clubbing, cyanosis or edema; 2+ radial, ulnar and brachial pulses bilaterally; 2+ right femoral, posterior tibial and dorsalis pedis pulses; 2+ left femoral, posterior tibial and dorsalis pedis pulses; no subclavian or femoral bruits Neurological: grossly nonfocal Psych: Normal mood and affect     Wt Readings from Last 3 Encounters:  11/19/22 185 lb (83.9 kg)  10/30/22 186 lb (84.4 kg)  09/29/22 182 lb 12.8 oz (82.9 kg)      Studies/Labs Reviewed:   ECHO 09/19/2018  1. The left ventricle has normal systolic function, with an ejection fraction of 55-60%. The cavity size was normal. There is mild asymmetric left ventricular hypertrophy. Left ventricular diastolic Doppler parameters are consistent with impaired  relaxation. No evidence of left ventricular regional wall motion abnormalities.  2. The average left ventricular global longitudinal strain is 14.9 %.  3. The right ventricle has normal systolic function. The cavity was normal. There is no increase in right ventricular wall thickness.  4. Left atrial size was mildly dilated.  5. The aortic valve is tricuspid. Mild sclerosis of the aortic valve. Aortic valve regurgitation is trivial by color flow Doppler.    EKG:  EKG is ordered today.  It shows atrial paced, ventricular sensed rhythm, a couple of monomorphic PVCs, QTc 508 ms  Recent  Labs: 05/05/2022: Hemoglobin 12.8; Platelets 183 08/24/2022: BUN 19; Creatinine, Ser 1.34; Magnesium 2.4; Potassium 4.8; Sodium 139   Lipid Panel    Component Value Date/Time   CHOL 110 08/05/2021 1118   TRIG 57 08/05/2021 1118   HDL 55 08/05/2021 1118   CHOLHDL 2.0 08/05/2021 1118   CHOLHDL 2.4 04/21/2016 1438   VLDL 15 04/21/2016 1438   LDLCALC 42 08/05/2021 1118   LDLDIRECT 94 12/07/2007 2019     ASSESSMENT:    1. Ventricular tachycardia (HCC)   2. Paroxysmal atrial fibrillation (HCC)   3. Chronic diastolic heart failure (HCC)   4. ICD (implantable cardioverter-defibrillator) in place   5. Acquired thrombophilia (HCC)   6. Essential hypertension   7. Hypercholesterolemia   8. Overweight   9.  OSA (obstructive sleep apnea)   10. Encounter for monitoring dofetilide therapy   11. Moderate persistent asthma without complication   12. Type 2 diabetes mellitus with diabetic neuropathy, with long-term current use of insulin (HCC)   13. Stage 3a chronic kidney disease (HCC)   14. Medication management       VT: Despite recovered left ventricular ejection fraction she continues to have episodes of ventricular tachycardia that have required ICD intervention albuterol the average only every 2 years or so.  Continues to also have occasional brief bursts of nonsustained VT.  On dofetilide and beta-blockers. PAF: Arrhythmia unaware, low prevalence of atrial fibrillation at around 1% on dofetilide and bisoprolol.  On appropriate anticoagulation.  CHA2DS2-VASc 5 (CHF, gender, age, hypertension, diabetes mellitus). CHF: NYHA functional class I, appears clinically euvolemic.  She has lost a moderate weight after starting treatment with semaglutide, but now her weight seems to have stabilized around 280-185 pounds which I think is a good approximation of her "dry weight".  On maximum tolerated doses of beta-blocker and on SGLT2 inhibitor.  Echo shows recovered EF and NYHA functional class I, BP  low, so she is not on Entresto or  aldosterone antagonist. ICD: Normal device function.  Continue remote downloads every 3 months.  Has had at least 3 appropriate interventions for very fast VT. Anticoagulation: Denies bleeding complications. HTN: BP is borderline low today.  She has a history of cough with ACE inhibitor's.  There is probably no room for Entresto or spironolactone. HLP: All parameters in target range.  Continue statin. Overweight: Potential weight loss after setting semaglutide, over 60 pounds, now stabilized at a BMI of about 29/weight 180-185 pounds. OSA: Compliant with CPAP and denies daytime hypersomnolence. Tikosyn: QTc slightly longer than desirable today at 500 ms.  Normal renal function parameters and potassium level.  No changes made to medications today.  Recheck labs before the end of the year.  Her ventricular arrhythmia has always been monomorphic, rather than torsades. Asthma: Tolerates bisoprolol well.  Other beta-blockers worsened wheezing in the past. DM: fair control CKD: Stable renal function, GFR 50-60.  Most recent creatinine 1.34.   For some reason her insurance charges more for 10 mg bisoprolol tabs (nonformulary) than it does for 25 mg.  PLAN:  In order of problems listed above:  Medication Adjustments/Labs and Tests Ordered: Current medicines are reviewed at length with the patient today.  Concerns regarding medicines are outlined above.  Medication changes, Labs and Tests ordered today are listed in the Patient Instructions below. Patient Instructions  Medication Instructions:   Your physician recommends that you continue on your current medications as directed. Please refer to the Current Medication list given to you today.   *If you need a refill on your cardiac medications before your next appointment, please call your pharmacy*   Lab Work:  BMET  IN [facility]   If you have labs (blood work) drawn today and your tests are completely normal, you  will receive your results only by: MyChart Message (if you have MyChart) OR A paper copy in the mail If you have any lab test that is abnormal or we need to change your treatment, we will call you to review the results.   Testing/Procedures: NONE ORDERED  TODAY     Follow-Up: At [facility], you and your health needs are our priority.  As part of our continuing mission to provide you with exceptional heart care, we have created designated Provider Care Teams.  These Care Teams include your primary Cardiologist (physician) and Advanced Practice Providers (APPs -  Physician Assistants and Nurse Practitioners) who all work together to provide you with the care you need, when you need it.  We recommend signing up for the patient portal called "MyChart".  Sign up information is provided on this After Visit Summary.  MyChart is used to connect with patients for Virtual Visits (Telemedicine).  Patients are able to view lab/test results, encounter notes, upcoming appointments, etc.  Non-urgent messages can be sent to your provider as well.   To learn more about what you can do with MyChart, go to [email or web address].    Your next appointment:   6 month(s)  Provider:   Thurmon Fair, MD  Other Instructions       Signed, Thurmon Fair, MD  11/19/2022 10:06 AM    [facility] [address] [place], [place], [place]  [address] Phone: [phone]; Fax: [phone]

## 2022-11-26 ENCOUNTER — Ambulatory Visit
Admission: RE | Admit: 2022-11-26 | Discharge: 2022-11-26 | Disposition: A | Discharge status: Home / Self Care | Payer: Medicare HMO | Source: Ambulatory Visit | Attending: Family Medicine | Admitting: Family Medicine

## 2022-11-26 DIAGNOSIS — Z1231 Encounter for screening mammogram for malignant neoplasm of breast: Secondary | ICD-10-CM

## 2022-12-02 ENCOUNTER — Other Ambulatory Visit (HOSPITAL_COMMUNITY): Payer: Self-pay

## 2022-12-02 ENCOUNTER — Other Ambulatory Visit: Payer: Self-pay | Admitting: Cardiovascular Disease

## 2022-12-02 ENCOUNTER — Other Ambulatory Visit: Payer: Self-pay | Admitting: Family Medicine

## 2022-12-02 ENCOUNTER — Other Ambulatory Visit: Payer: Self-pay

## 2022-12-02 DIAGNOSIS — E1140 Type 2 diabetes mellitus with diabetic neuropathy, unspecified: Secondary | ICD-10-CM

## 2022-12-02 DIAGNOSIS — I480 Paroxysmal atrial fibrillation: Secondary | ICD-10-CM

## 2022-12-02 MED ORDER — APIXABAN 5 MG PO TABS
5.0000 mg | ORAL_TABLET | Freq: Two times a day (BID) | ORAL | 1 refills | Status: DC
  Filled 2022-12-02: qty 180, 90d supply, fill #0
  Filled 2023-03-01: qty 180, 90d supply, fill #1

## 2022-12-02 MED ORDER — LOSARTAN POTASSIUM 25 MG PO TABS
25.0000 mg | ORAL_TABLET | Freq: Every day | ORAL | 0 refills | Status: DC
  Filled 2022-12-02: qty 90, 90d supply, fill #0

## 2022-12-02 NOTE — Telephone Encounter (Signed)
Prescription refill request for Eliquis received. Indication:afib Last office visit:8/24 Scr:1.34  5/24 Age: 72 Weight:83.9  kg  Prescription refilled

## 2022-12-04 NOTE — Progress Notes (Signed)
Remote ICD transmission.   

## 2022-12-07 ENCOUNTER — Ambulatory Visit: Payer: Medicare HMO | Attending: Cardiovascular Disease

## 2022-12-07 DIAGNOSIS — I5032 Chronic diastolic (congestive) heart failure: Secondary | ICD-10-CM | POA: Diagnosis not present

## 2022-12-07 DIAGNOSIS — Z95810 Presence of automatic (implantable) cardiac defibrillator: Secondary | ICD-10-CM | POA: Diagnosis not present

## 2022-12-10 NOTE — Progress Notes (Signed)
EPIC Encounter for ICM Monitoring  Patient Name: [name] is a 72 y.o. female Date: 12/10/2022 Primary Care Physican: [name], MD Primary Cardiologist: Croitoru Electrophysiologist: Croitoru 07/23/2022 Weight: 183-184 lbs 09/30/2022 Weight: 182 lbs 11/04/2022 Weight: 182-184 lbs                 Since 19-Nov-2022 AT/AF  96 Time in AT/AF  0.6 hr/day (2.5%) Longest AT/AF  36 minutes                                  Spoke with patient and heart failure questions reviewed.  Transmission results reviewed.  Pt asymptomatic for fluid accumulation.  Re ports feeling well at this time and voices no complaints.     Optivol thoracic impedance suggesting normal fluid levels within the last month.     Prescribed dosage:  Torsemide 20 mg 1 tablet by mouth daily.   Potassium 10 mEq 1 tablet by mouth by mouth daily.   Labs: 08/24/2022 Creatinine 1.34, BUN 19, Potassium 4.8, Sodium 139, GFR 42 05/05/2022 Creatinine 1.12, BUN 15, Potassium 4.5, Sodium 136 A complete set of results can be found in Results Review.   Recommendations:   No changes and encouraged to call if experiencing any fluid symptoms.   Follow-up plan: ICM clinic phone appointment on 01/18/2023.   91 day device clinic remote transmission 02/17/2023.     Next office visit scheduled:  Due for 6 month follow with Dr Royann Shivers after 11/19/2022 visit.     Copy of ICM check sent to Dr. Royann Shivers.    3 month ICM trend: 12/07/2022.    12-14 Month ICM trend:     [name], RN 12/10/2022 3:17 PM

## 2022-12-11 ENCOUNTER — Other Ambulatory Visit: Payer: Self-pay | Admitting: Family Medicine

## 2022-12-11 ENCOUNTER — Other Ambulatory Visit: Payer: Self-pay

## 2022-12-11 ENCOUNTER — Other Ambulatory Visit (HOSPITAL_COMMUNITY): Payer: Self-pay

## 2022-12-11 MED ORDER — OZEMPIC (2 MG/DOSE) 8 MG/3ML ~~LOC~~ SOPN
2.0000 mg | PEN_INJECTOR | SUBCUTANEOUS | 2 refills | Status: DC
  Filled 2022-12-11: qty 3, 28d supply, fill #0
  Filled 2023-01-07: qty 3, 28d supply, fill #1
  Filled 2023-02-02: qty 3, 28d supply, fill #2

## 2023-01-07 ENCOUNTER — Other Ambulatory Visit: Payer: Self-pay | Admitting: Family Medicine

## 2023-01-07 ENCOUNTER — Other Ambulatory Visit (HOSPITAL_COMMUNITY): Payer: Self-pay

## 2023-01-07 ENCOUNTER — Other Ambulatory Visit (HOSPITAL_BASED_OUTPATIENT_CLINIC_OR_DEPARTMENT_OTHER): Payer: Self-pay | Admitting: Pulmonary Disease

## 2023-01-07 ENCOUNTER — Other Ambulatory Visit: Payer: Self-pay | Admitting: Cardiovascular Disease

## 2023-01-07 ENCOUNTER — Other Ambulatory Visit: Payer: Self-pay

## 2023-01-07 DIAGNOSIS — E1140 Type 2 diabetes mellitus with diabetic neuropathy, unspecified: Secondary | ICD-10-CM

## 2023-01-07 MED ORDER — MONTELUKAST SODIUM 10 MG PO TABS
10.0000 mg | ORAL_TABLET | Freq: Every day | ORAL | 0 refills | Status: DC
  Filled 2023-01-07: qty 90, 90d supply, fill #0

## 2023-01-07 MED ORDER — BISOPROLOL FUMARATE 10 MG PO TABS
10.0000 mg | ORAL_TABLET | Freq: Two times a day (BID) | ORAL | 3 refills | Status: DC
  Filled 2023-01-07 – 2023-03-01 (×2): qty 180, 90d supply, fill #0
  Filled 2023-05-31: qty 180, 90d supply, fill #1
  Filled 2023-08-25: qty 180, 90d supply, fill #2
  Filled 2023-11-19: qty 180, 90d supply, fill #3

## 2023-01-07 MED ORDER — TRESIBA FLEXTOUCH 100 UNIT/ML ~~LOC~~ SOPN
40.0000 [IU] | PEN_INJECTOR | Freq: Every day | SUBCUTANEOUS | 2 refills | Status: DC
  Filled 2023-01-07 – 2023-01-21 (×2): qty 15, 37d supply, fill #0
  Filled 2023-02-24: qty 15, 37d supply, fill #1
  Filled 2023-04-05: qty 15, 37d supply, fill #2

## 2023-01-07 MED ORDER — DAPAGLIFLOZIN PROPANEDIOL 10 MG PO TABS
10.0000 mg | ORAL_TABLET | Freq: Every day | ORAL | 0 refills | Status: DC
  Filled 2023-01-07 – 2023-02-02 (×2): qty 90, 90d supply, fill #0

## 2023-01-07 MED ORDER — LOSARTAN POTASSIUM 25 MG PO TABS
25.0000 mg | ORAL_TABLET | Freq: Every day | ORAL | 0 refills | Status: DC
Start: 2023-01-07 — End: 2023-05-31
  Filled 2023-01-07 – 2023-03-10 (×2): qty 90, 90d supply, fill #0

## 2023-01-07 MED ORDER — TORSEMIDE 20 MG PO TABS
20.0000 mg | ORAL_TABLET | Freq: Every day | ORAL | 1 refills | Status: DC
  Filled 2023-01-07 – 2023-01-28 (×2): qty 90, 90d supply, fill #0
  Filled 2023-04-26: qty 90, 90d supply, fill #1

## 2023-01-07 MED ORDER — BD PEN NEEDLE NANO U/F 32G X 4 MM MISC
2 refills | Status: DC
Start: 2023-01-07 — End: 2023-10-18
  Filled 2023-01-07: qty 100, 100d supply, fill #0
  Filled 2023-04-26: qty 100, 100d supply, fill #1
  Filled 2023-07-29: qty 100, 100d supply, fill #2

## 2023-01-07 MED ORDER — BUPROPION HCL ER (XL) 150 MG PO TB24
150.0000 mg | ORAL_TABLET | Freq: Every day | ORAL | 3 refills | Status: DC
  Filled 2023-01-07 – 2023-03-01 (×2): qty 90, 90d supply, fill #0
  Filled 2023-05-31: qty 90, 90d supply, fill #1
  Filled 2023-08-25: qty 90, 90d supply, fill #2
  Filled 2023-11-19: qty 90, 90d supply, fill #3

## 2023-01-08 ENCOUNTER — Other Ambulatory Visit: Payer: Self-pay

## 2023-01-08 ENCOUNTER — Other Ambulatory Visit (HOSPITAL_COMMUNITY): Payer: Self-pay

## 2023-01-09 NOTE — Progress Notes (Signed)
Seen by casting department Dr. Eloy End patient

## 2023-01-11 ENCOUNTER — Other Ambulatory Visit: Payer: Self-pay

## 2023-01-11 ENCOUNTER — Ambulatory Visit (INDEPENDENT_AMBULATORY_CARE_PROVIDER_SITE_OTHER): Payer: Medicare HMO | Admitting: Family Medicine

## 2023-01-11 ENCOUNTER — Other Ambulatory Visit (HOSPITAL_COMMUNITY): Payer: Self-pay

## 2023-01-11 VITALS — BP 122/80 | HR 78 | Ht 66.0 in | Wt 186.0 lb

## 2023-01-11 DIAGNOSIS — R52 Pain, unspecified: Secondary | ICD-10-CM

## 2023-01-11 DIAGNOSIS — T8089XA Other complications following infusion, transfusion and therapeutic injection, initial encounter: Secondary | ICD-10-CM

## 2023-01-11 MED ORDER — GABAPENTIN 100 MG PO CAPS
100.0000 mg | ORAL_CAPSULE | Freq: Every day | ORAL | 0 refills | Status: DC
Start: 2023-01-11 — End: 2023-07-25
  Filled 2023-01-11: qty 90, 30d supply, fill #0

## 2023-01-11 NOTE — Patient Instructions (Addendum)
Please try taking gabapentin 100mg  (1 capsule) nightly. After 3-4 days, if this dose is not helping, you can take 200mg  (2 capsules) nightly. If that dose is not working after 3-4 days, you can increase to 300mg  (3 capsules) nightly.  Keep taking tylenol regularly. You can take up to 4000mg  (4g) daily.

## 2023-01-11 NOTE — Progress Notes (Signed)
    SUBJECTIVE:   CHIEF COMPLAINT / HPI:   Flu shot 8/26 at [facility], since then has been having unrelenting pain at vaccine site (left shoulder) Has been taking tylenol about twice daily, provides very minimal benefit Has not taken NSAIDs (on eliquis) Denies fevers, N/V, abd pain, CP, SOB Denies numbness/tingling in extremities Denies any falls or injuries   PERTINENT  PMH / PSH: A-fib on Eliquis  OBJECTIVE:   BP 122/80   Pulse 78   Ht 5\' 6"  (1.676 m)   Wt 186 lb (84.4 kg)   LMP 07/26/2011   SpO2 98%   BMI 30.02 kg/m    General: NAD, pleasant, able to participate in exam Respiratory: No respiratory distress Skin: warm and dry, no rashes noted Psych: Normal affect and mood  LUE: No gross deformity, ecchymosis, swelling.  Slightly tender to palpation of left deltoid at her injection site.  The area is not erythematous or discolored, skin is intact without any wounds or bleeding/drainage, and there is no underlying induration or fluctuance.  Full range of motion.  Strength 5/5.  Bilateral radial pulses 2+.  Sensation intact in bilateral hands and fingers.  ASSESSMENT/PLAN:   Assessment & Plan Pain at injection site, initial encounter No systemic symptoms, no evidence of infection or neurovascular compromise.  Advised to increase dose of Tylenol at home as she is unable to take NSAIDs due to being on Eliquis.  Will also send in gabapentin 100 mg nightly to assist with evening pain and any neuropathic component given that this has been prolonged for over 1 month.  Advised to self titrate up to 300 mg nightly if needed.  Provided return precautions.  Follow-up in 2 weeks   [name], MD [facility]

## 2023-01-12 ENCOUNTER — Encounter: Payer: Self-pay | Admitting: Podiatry

## 2023-01-12 ENCOUNTER — Ambulatory Visit: Payer: Medicare HMO | Admitting: Podiatry

## 2023-01-12 DIAGNOSIS — B351 Tinea unguium: Secondary | ICD-10-CM | POA: Diagnosis not present

## 2023-01-12 DIAGNOSIS — M2042 Other hammer toe(s) (acquired), left foot: Secondary | ICD-10-CM | POA: Diagnosis not present

## 2023-01-12 DIAGNOSIS — E119 Type 2 diabetes mellitus without complications: Secondary | ICD-10-CM | POA: Diagnosis not present

## 2023-01-12 DIAGNOSIS — M2012 Hallux valgus (acquired), left foot: Secondary | ICD-10-CM

## 2023-01-12 DIAGNOSIS — M2011 Hallux valgus (acquired), right foot: Secondary | ICD-10-CM

## 2023-01-12 DIAGNOSIS — M2041 Other hammer toe(s) (acquired), right foot: Secondary | ICD-10-CM

## 2023-01-12 DIAGNOSIS — L84 Corns and callosities: Secondary | ICD-10-CM

## 2023-01-12 DIAGNOSIS — E1142 Type 2 diabetes mellitus with diabetic polyneuropathy: Secondary | ICD-10-CM | POA: Diagnosis not present

## 2023-01-12 DIAGNOSIS — M79609 Pain in unspecified limb: Secondary | ICD-10-CM | POA: Diagnosis not present

## 2023-01-12 NOTE — Progress Notes (Signed)
ANNUAL DIABETIC FOOT EXAM  Subjective: [name] presents today annual diabetic foot exam.  Chief Complaint  Patient presents with   Diabetes    DFC [id number] A1C- 7.5   Patient confirms h/o diabetes.  Patient denies any h/o foot wounds.  Patient has been diagnosed with neuropathy.  Risk factors: diabetes, diabetic neuropathy, foot deformity, HTN, CHF, CKD, hypercholesterolemia.  Diane Drafts, [name] is patient's PCP.  Past Medical History:  Diagnosis Date   AICD (automatic cardioverter/defibrillator) present    Anemia    Anxiety    Arthritis    Arthritis of carpometacarpal (CMC) joint of thumb 03/26/2016   Asthma    has had multiple hospitalizations for this   Asthma, moderate persistent 12/10/2011   Atrial fibrillation (HCC)    ablation x 2 [facility], 01/2006, 2011.  on warfarin   Cardiac arrest (HCC) 04/2010   in hospital for pneumonia when this occured- occured at the hospital   CHF (congestive heart failure) (HCC) 2012   Echo 08/08/10 by [facility]. EF 35-45%. LV systolic function moderately reduced. Moderate global hypokinesis of LV.  RV systolic function moderately reduced. Mild MR. Trace AR.   Chronic diastolic heart failure (HCC) 01/07/2018   CKD (chronic kidney disease) stage 2, GFR 60-89 ml/min 02/22/2012   Her cr range 1.2-1.5 since Jan 2012 after hospitalization. 10/13 Estimated Creatinine Clearance: 69.3 ml/min (by C-G formula based on Cr of 1).     Complication of anesthesia    difficult time waking up after anesthesia   Depression    Diabetes mellitus    Type 2   Diabetic neuropathy (HCC) 09/18/2011   GASTROESOPHAGEAL REFLUX, NO ESOPHAGITIS 06/10/2006   Qualifier: Diagnosis of  By: [name]     GERD (gastroesophageal reflux disease) 03/23/2018   History of hiatal hernia    History of kidney stones    HYPERCHOLESTEROLEMIA 06/10/2006   LDL 93 at 01/2011 check-  Continue pravastatin 20mg  po daily.  --discuss health modifications and possibly  increaseing dose at next appt.  Cardiology- Diane little- stopped pravachol on 05/21/11- 2/2 allergies?     Hyperlipidemia    Hypertension    ICD (St. Jude Protecta dual-chamber),secondary prevention (VF arrest) January 2012 11/19/2012   Iliotibial band syndrome, left 11/01/2018   Limb pain 06/04/2008   LLE, Baker's cyst in popliteal fossa, no DVT   Long term current use of anticoagulant therapy 08/21/2015   Non-ischemic cardiomyopathy (HCC)    echo 08/08/10 - EF 35-45% LV and RV systolic function mod reduced   Obesity (BMI 30-39.9) 01/04/2014   OSA on CPAP 06/10/2006   Sleep study 02/2014 : severe apnea, corrected with nasal pillows and 8cmh2o     Osteoarthritis of right knee 08/28/2008   Qualifier: Diagnosis of  By: [name], [name]     Primary localized osteoarthritis of right knee    Sleep apnea    wears CPAP nightly   Type 2 diabetes mellitus with diabetic neuropathy, with long-term current use of insulin (HCC) 06/10/2006              Ventricular tachycardia (HCC) 05/22/2015   Visit for monitoring Tikosyn therapy 08/21/2015   Vocal cord disease    Patient Active Problem List   Diagnosis Date Noted   Osteopenia 09/29/2022   S/P TKR (total knee replacement), right 04/28/2021   Aortic atherosclerosis (HCC) 02/19/2021   Chronic renal impairment, stage 3a (HCC) 06/04/2020   GERD (gastroesophageal reflux disease) 03/23/2018   Chronic diastolic heart failure (HCC)  01/07/2018   Arthritis of carpometacarpal (CMC) joint of thumb 03/26/2016   Long term current use of anticoagulant therapy 08/21/2015   Ventricular tachycardia (HCC) 05/22/2015   ICD (St. Jude Protecta dual-chamber),secondary prevention (VF arrest) January 2012 11/19/2012   Asthma, moderate persistent 12/10/2011   Diabetic neuropathy (HCC) 09/18/2011   Type 2 diabetes mellitus with diabetic neuropathy, with long-term current use of insulin (HCC) 06/10/2006   HYPERCHOLESTEROLEMIA 06/10/2006   Essential hypertension  06/10/2006   PAF (paroxysmal atrial fibrillation) (HCC) 06/10/2006   OSA on CPAP 06/10/2006   Past Surgical History:  Procedure Laterality Date   BREAST EXCISIONAL BIOPSY Right 1999   BREAST LUMPECTOMY Right    CARDIAC DEFIBRILLATOR PLACEMENT  05/02/10   Medtronic Protecta XT-Diane for CHF-VT, last download 04/12/12   CHOLECYSTECTOMY     COLONOSCOPY     HAMMER TOE SURGERY Right    ICD GENERATOR CHANGEOUT N/A 09/28/2017   Procedure: ICD GENERATOR CHANGEOUT;  Surgeon: Thurmon Fair, [name];  Location: [facility] IN[facility];  Service: Cardiovascular;  Laterality: N/A;   KNEE ARTHROSCOPY Right    PAF Ablation     By [name]. Now sees [name] at [facility]   TOTAL KNEE ARTHROPLASTY Left 09/17/2014   Procedure: TOTAL KNEE ARTHROPLASTY;  Surgeon: Diane Marvel, [name];  Location: [facility] OR;  Service: Orthopedics;  Laterality: Left;  pt has ICD   TOTAL KNEE ARTHROPLASTY Right 04/28/2021   Procedure: TOTAL KNEE ARTHROPLASTY;  Surgeon: Diane Laura, [name];  Location: [facility] ORS;  Service: Orthopedics;  Laterality: Right;   TUBAL LIGATION     Current Outpatient Medications on File Prior to Visit  Medication Sig Dispense Refill   BOOSTRIX 5-2.5-18.5 LF-MCG/0.5 injection      FLUZONE HIGH-DOSE 0.5 ML injection      acetaminophen (TYLENOL 8 HOUR) 650 MG CR tablet Take 1 tablet (650 mg total) by mouth every 8 (eight) hours as needed for pain. (Patient not taking: Reported on 11/19/2022) 90 tablet 2   apixaban (ELIQUIS) 5 MG TABS tablet Take 1 tablet (5 mg total) by mouth 2 (two) times daily. 180 tablet 1   bisoprolol (ZEBETA) 10 MG tablet Take 1 tablet (10 mg total) by mouth in the morning and at bedtime. 180 tablet 3   budesonide-formoterol (SYMBICORT) 80-4.5 MCG/ACT inhaler INHALE 2 PUFFS INTO THE LUNGS IN THE MORNING AND AT BEDTIME 10.2 g 11   buPROPion (WELLBUTRIN XL) 150 MG 24 hr tablet Take 1 tablet (150 mg total) by mouth daily. 90 tablet 3   dapagliflozin propanediol (FARXIGA) 10 MG TABS tablet Take 1  tablet (10 mg total) by mouth daily. 90 tablet 0   diclofenac Sodium (VOLTAREN) 1 % GEL Apply 2 g topically 4 (four) times daily. 100 g 1   dofetilide (TIKOSYN) 250 MCG capsule Take 1 capsule (250 mcg total) by mouth 2 (two) times daily. 180 capsule 2   famotidine (PEPCID) 20 MG tablet Take 1 tablet (20 mg total) by mouth daily. 90 tablet 3   fluticasone (FLONASE) 50 MCG/ACT nasal spray instill 1 spray into each nostril twice a day 16 g 5   gabapentin (NEURONTIN) 100 MG capsule Take 1 capsule (100 mg total) by mouth at bedtime. After 3-4 days, you can increase to 200mg  nightly if needed. After 3-4 days, you can increase again to 300mg  nightly if needed. 90 capsule 0   glucose blood test strip Use as instructed 100 each 12   insulin degludec (TRESIBA FLEXTOUCH) 100 UNIT/ML FlexTouch Pen Inject 40 Units into  the skin daily. 15 mL 2   Insulin Pen Needle (BD PEN NEEDLE NANO U/F) 32G X 4 MM MISC Use as directed with insulin once daily 100 each 2   levalbuterol (XOPENEX HFA) 45 MCG/ACT inhaler Inhale 1-2 puffs into the lungs every 4 (four) hours as needed for wheezing 15 g 1   losartan (COZAAR) 25 MG tablet Take 1 tablet (25 mg total) by mouth at bedtime. 90 tablet 0   montelukast (SINGULAIR) 10 MG tablet Take 1 tablet (10 mg) by mouth at bedtime. 90 tablet 0   potassium chloride (KLOR-CON M) 10 MEQ tablet Take 1 tablet (10 mEq total) by mouth daily as directed 90 tablet 2   PRODIGY LANCETS 28G MISC 1 Units by Does not apply route 4 (four) times daily. 100 each 4   rosuvastatin (CRESTOR) 10 MG tablet Take 1 tablet (10 mg total) by mouth daily. 90 tablet 2   Semaglutide, 2 MG/DOSE, (OZEMPIC, 2 MG/DOSE,) 8 MG/3ML SOPN Inject 2 mg into the skin once a week. 3 mL 2   torsemide (DEMADEX) 20 MG tablet Take 1 tablet (20 mg total) by mouth daily. 90 tablet 1   No current facility-administered medications on file prior to visit.    Allergies  Allergen Reactions   Avelox [Moxifloxacin Hcl In Nacl] Other (See  Comments)    Cardiac arrest per pt   Nsaids Other (See Comments)    Cardiac arrest per pt    Simvastatin Other (See Comments)    Muscle pain   Ace Inhibitors Other (See Comments) and Cough   Jardiance [Empagliflozin] Other (See Comments)    Felt "crazy", fatigue, sweating, denies hypoglycemia while taking   Latex Rash   Social History   Occupational History   Occupation: Retired  Tobacco Use   Smoking status: Never    Passive exposure: Never   Smokeless tobacco: Never  Vaping Use   Vaping status: Never Used  Substance and Sexual Activity   Alcohol use: No   Drug use: No   Sexual activity: Not Currently   Family History  Problem Relation Age of Onset   Diabetes Father    Hypertension Father    Diabetes Brother    Hypertension Brother    Lung disease Neg Hx    Cancer Neg Hx    Rheumatologic disease Neg Hx    Immunization History  Administered Date(s) Administered   Fluad Quad(high Dose 65+) 01/17/2019, 01/31/2020, 12/11/2020, 01/01/2022   H1N1 03/23/2008   Influenza Split 01/06/2011, 01/18/2012   Influenza Whole 01/09/2008, 01/21/2009, 01/14/2010   Influenza,inj,Quad PF,6+ Mos 12/21/2012, 01/04/2014, 01/14/2015, 12/06/2015, 12/04/2016, 12/08/2017   Moderna Sars-Covid-2 Vaccination 05/17/2019, 06/14/2019, 02/09/2020   Pneumococcal Conjugate-13 09/25/2013   Pneumococcal Polysaccharide-23 01/11/2001, 02/22/2012, 04/08/2017   Td 01/11/2001   Tdap 02/22/2012   Zoster Recombinant(Shingrix) 10/12/2020, 12/12/2020     Review of Systems: Negative except as noted in the HPI.   Objective: There were no vitals filed for this visit.  [name] is a pleasant 72 y.o. female in NAD. AAO X 3.  Vascular Examination: CFT <3 seconds b/l. DP/PT pulses faintly palpable b/l. Skin temperature gradient warm to warm b/l. No ischemia or gangrene. No cyanosis or clubbing noted b/l. Pedal hair sparse. No pain with calf compression b/l.   Neurological Examination: Sensation grossly  intact b/l with 10 gram monofilament. Vibratory sensation intact b/l. Pt has subjective symptoms of neuropathy.  Dermatological Examination: Pedal skin with normal turgor, texture and tone b/l.  No open wounds. No  interdigital macerations.   Toenails 1-5 b/l thick, discolored, elongated with subungual debris and pain on dorsal palpation.   Hyperkeratotic lesion(s) with no erythema, no edema, no drainage, no fluctuance: plantar IPJ of left great toe plantar IPJ of right great toe submet head 2 right foot submet head 3 left foot submet head 3 right foot 1st metatarsal head LLE 1st metatarsal head RLE  Musculoskeletal Examination: Muscle strength 5/5 to all lower extremity muscle groups bilaterally. HAV with bunion deformity noted b/l LE. Hammertoe deformity noted 2-5 b/l.  Radiographs: None  Last A1c:      Latest Ref Rng & Units 10/30/2022    8:40 AM 07/14/2022    8:51 AM  Hemoglobin A1C  Hemoglobin-A1c 0.0 - 7.0 % 7.5  7.5    ADA Risk Categorization: Low Risk :  Patient has all of the following: Intact protective sensation No prior foot ulcer  No severe deformity Pedal pulses present  Assessment: 1. Pain due to onychomycosis of nail   2. Corns and callosities   3. Valgus deformity of both great toes   4. Hammer toes of both feet   5. Diabetic polyneuropathy associated with type 2 diabetes mellitus (HCC)   6. Encounter for diabetic foot exam [facility])     Plan: -Patient was evaluated and treated. All patient's and/or POA's questions/concerns answered on today's visit. -Diabetic foot examination performed today. -Stressed the importance of good glycemic control and the detriment of not  controlling glucose levels in relation to the foot. -Continue diabetic foot care principles: inspect feet daily, monitor glucose as recommended by PCP and/or Endocrinologist, and follow prescribed diet per PCP, Endocrinologist and/or dietician. -Patient to continue soft, supportive shoe gear  daily. -Toenails 1-5 b/l were debrided in length and girth with sterile nail nippers and dremel without iatrogenic bleeding.  -Porokeratotic lesion(s) bilateral great toes, submet head 2 b/l, submet head 3 b/l, and 1st metatarsal head of both feet pared and enucleated with sterile currette without incident. Total number of lesions debrided=8. -Patient/POA to call should there be question/concern in the interim. Return in about 3 months (around 04/14/2023).  Freddie Breech, DPM

## 2023-01-14 DIAGNOSIS — J45909 Unspecified asthma, uncomplicated: Secondary | ICD-10-CM | POA: Diagnosis not present

## 2023-01-14 DIAGNOSIS — G4733 Obstructive sleep apnea (adult) (pediatric): Secondary | ICD-10-CM | POA: Diagnosis not present

## 2023-01-14 DIAGNOSIS — J449 Chronic obstructive pulmonary disease, unspecified: Secondary | ICD-10-CM | POA: Diagnosis not present

## 2023-01-18 ENCOUNTER — Ambulatory Visit: Payer: Medicare HMO

## 2023-01-18 DIAGNOSIS — Z95810 Presence of automatic (implantable) cardiac defibrillator: Secondary | ICD-10-CM

## 2023-01-18 DIAGNOSIS — I5032 Chronic diastolic (congestive) heart failure: Secondary | ICD-10-CM | POA: Diagnosis not present

## 2023-01-21 ENCOUNTER — Other Ambulatory Visit (HOSPITAL_COMMUNITY): Payer: Self-pay

## 2023-01-22 NOTE — Progress Notes (Signed)
EPIC Encounter for ICM Monitoring  Patient Name: [name] is a 72 y.o. female Date: 01/22/2023 Primary Care Physican: [name], MD Primary Cardiologist: Croitoru Electrophysiologist: Croitoru 07/23/2022 Weight: 183-184 lbs 09/30/2022 Weight: 182 lbs 11/04/2022 Weight: 182-184 lbs                 Since 07-Dec-2022 AT/AF  29 Time in AT/AF 0.1 hr/day (0.6%) Longest AT/AF 2 hours VT-NS (>4 beats, >207 bpm)  2                                  Spoke with patient and heart failure questions reviewed.  Transmission results reviewed.  Pt asymptomatic for fluid accumulation.  Re ports feeling well at this time and voices no complaints.     Optivol thoracic impedance suggesting normal fluid levels within the last month.     Prescribed dosage:  Torsemide 20 mg 1 tablet by mouth daily.   Potassium 10 mEq 1 tablet by mouth by mouth daily.   Labs: 08/24/2022 Creatinine 1.34, BUN 19, Potassium 4.8, Sodium 139, GFR 42 05/05/2022 Creatinine 1.12, BUN 15, Potassium 4.5, Sodium 136 A complete set of results can be found in Results Review.   Recommendations:   No changes and encouraged to call if experiencing any fluid symptoms.   Follow-up plan: ICM clinic phone appointment on 02/22/2023.   91 day device clinic remote transmission 02/17/2023.     Next office visit scheduled:  Due for 6 month follow with Dr Royann Shivers after 11/19/2022 visit.     Copy of ICM check sent to Dr. Royann Shivers.   3 month ICM trend: 01/18/2023.    12-14 Month ICM trend:     [name], RN 01/22/2023 4:38 PM

## 2023-01-28 ENCOUNTER — Other Ambulatory Visit (HOSPITAL_COMMUNITY): Payer: Self-pay

## 2023-02-02 ENCOUNTER — Other Ambulatory Visit (HOSPITAL_COMMUNITY): Payer: Self-pay

## 2023-02-02 ENCOUNTER — Other Ambulatory Visit: Payer: Self-pay

## 2023-02-16 DIAGNOSIS — Z79899 Other long term (current) drug therapy: Secondary | ICD-10-CM | POA: Diagnosis not present

## 2023-02-17 ENCOUNTER — Ambulatory Visit (INDEPENDENT_AMBULATORY_CARE_PROVIDER_SITE_OTHER): Payer: Medicare HMO

## 2023-02-17 DIAGNOSIS — I472 Ventricular tachycardia, unspecified: Secondary | ICD-10-CM

## 2023-02-17 LAB — BASIC METABOLIC PANEL
BUN/Creatinine Ratio: 11 — ABNORMAL LOW (ref 12–28)
BUN: 15 mg/dL (ref 8–27)
CO2: 24 mmol/L (ref 20–29)
Calcium: 9 mg/dL (ref 8.7–10.3)
Chloride: 101 mmol/L (ref 96–106)
Creatinine, Ser: 1.36 mg/dL — ABNORMAL HIGH (ref 0.57–1.00)
Glucose: 139 mg/dL — ABNORMAL HIGH (ref 70–99)
Potassium: 4.7 mmol/L (ref 3.5–5.2)
Sodium: 139 mmol/L (ref 134–144)
eGFR: 42 mL/min/{1.73_m2} — ABNORMAL LOW (ref >59)

## 2023-02-17 LAB — CUP PACEART REMOTE DEVICE CHECK
Battery Remaining Longevity: 31 mo
Battery Voltage: 2.95 V
Brady Statistic AP VP Percent: 0.89 %
Brady Statistic AP VS Percent: 94.19 %
Brady Statistic AS VP Percent: 0.05 %
Brady Statistic AS VS Percent: 4.87 %
Brady Statistic RA Percent Paced: 90.96 %
Brady Statistic RV Percent Paced: 1.02 %
Date Time Interrogation Session: 20241106043723
HighPow Impedance: 74 Ohm
Implantable Lead Connection Status: 753985
Implantable Lead Connection Status: 753985
Implantable Lead Implant Date: 20120120
Implantable Lead Implant Date: 20120120
Implantable Lead Location: 753859
Implantable Lead Location: 753860
Implantable Lead Model: 5076
Implantable Lead Model: 6935
Implantable Pulse Generator Implant Date: 20190618
Lead Channel Impedance Value: 323 Ohm
Lead Channel Impedance Value: 399 Ohm
Lead Channel Impedance Value: 399 Ohm
Lead Channel Pacing Threshold Amplitude: 0.5 V
Lead Channel Pacing Threshold Amplitude: 0.75 V
Lead Channel Pacing Threshold Pulse Width: 0.4 ms
Lead Channel Pacing Threshold Pulse Width: 0.4 ms
Lead Channel Sensing Intrinsic Amplitude: 0.75 mV
Lead Channel Sensing Intrinsic Amplitude: 0.75 mV
Lead Channel Sensing Intrinsic Amplitude: 11.125 mV
Lead Channel Sensing Intrinsic Amplitude: 11.125 mV
Lead Channel Setting Pacing Amplitude: 2 V
Lead Channel Setting Pacing Amplitude: 2.5 V
Lead Channel Setting Pacing Pulse Width: 0.4 ms
Lead Channel Setting Sensing Sensitivity: 0.3 mV
Zone Setting Status: 755011
Zone Setting Status: 755011

## 2023-02-22 ENCOUNTER — Ambulatory Visit: Payer: Medicare HMO | Attending: Cardiovascular Disease

## 2023-02-22 DIAGNOSIS — I5032 Chronic diastolic (congestive) heart failure: Secondary | ICD-10-CM

## 2023-02-22 DIAGNOSIS — Z95810 Presence of automatic (implantable) cardiac defibrillator: Secondary | ICD-10-CM | POA: Diagnosis not present

## 2023-02-24 ENCOUNTER — Other Ambulatory Visit (HOSPITAL_COMMUNITY): Payer: Self-pay

## 2023-02-26 NOTE — Progress Notes (Signed)
EPIC Encounter for ICM Monitoring  Patient Name: [name] is a 72 y.o. female Date: 02/26/2023 Primary Care Physican: [name], MD Primary Cardiologist: Croitoru Electrophysiologist: Croitoru 07/23/2022 Weight: 183-184 lbs 09/30/2022 Weight: 182 lbs 11/04/2022 Weight: 182-184 lbs 02/26/2023 Weight: 182 lbs                 Since 17-Feb-2023 AT/AF  3 Time in AT/AF 0.3 hr/day (1.4%) Longest AT/AF  85 minutes                                  Spoke with patient and heart failure questions reviewed.  Transmission results reviewed.  Pt asymptomatic for fluid accumulation.  Re ports feeling well at this time and voices no complaints.     Optivol thoracic impedance suggesting normal fluid levels within the last month.     Prescribed dosage:  Torsemide 20 mg 1 tablet by mouth daily.   Potassium 10 mEq 1 tablet by mouth by mouth daily.   Labs: 08/24/2022 Creatinine 1.34, BUN 19, Potassium 4.8, Sodium 139, GFR 42 05/05/2022 Creatinine 1.12, BUN 15, Potassium 4.5, Sodium 136 A complete set of results can be found in Results Review.   Recommendations:   No changes and encouraged to call if experiencing any fluid symptoms.   Follow-up plan: ICM clinic phone appointment on 04/12/2023.   91 day device clinic remote transmission 05/19/2023.     Next office visit scheduled:  Due for 6 month follow with Dr Royann Shivers after 11/19/2022 visit.     Copy of ICM check sent to Dr. Royann Shivers.   3 month ICM trend: 02/22/2023.    12-14 Month ICM trend:     [name], RN 02/26/2023 4:01 PM

## 2023-03-01 ENCOUNTER — Other Ambulatory Visit: Payer: Self-pay

## 2023-03-01 ENCOUNTER — Other Ambulatory Visit (HOSPITAL_COMMUNITY): Payer: Self-pay

## 2023-03-02 DIAGNOSIS — I252 Old myocardial infarction: Secondary | ICD-10-CM | POA: Diagnosis not present

## 2023-03-02 DIAGNOSIS — D6869 Other thrombophilia: Secondary | ICD-10-CM | POA: Diagnosis not present

## 2023-03-02 DIAGNOSIS — R32 Unspecified urinary incontinence: Secondary | ICD-10-CM | POA: Diagnosis not present

## 2023-03-02 DIAGNOSIS — M1990 Unspecified osteoarthritis, unspecified site: Secondary | ICD-10-CM | POA: Diagnosis not present

## 2023-03-02 DIAGNOSIS — I2510 Atherosclerotic heart disease of native coronary artery without angina pectoris: Secondary | ICD-10-CM | POA: Diagnosis not present

## 2023-03-02 DIAGNOSIS — N1831 Chronic kidney disease, stage 3a: Secondary | ICD-10-CM | POA: Diagnosis not present

## 2023-03-02 DIAGNOSIS — I471 Supraventricular tachycardia, unspecified: Secondary | ICD-10-CM | POA: Diagnosis not present

## 2023-03-02 DIAGNOSIS — I4891 Unspecified atrial fibrillation: Secondary | ICD-10-CM | POA: Diagnosis not present

## 2023-03-02 DIAGNOSIS — G4733 Obstructive sleep apnea (adult) (pediatric): Secondary | ICD-10-CM | POA: Diagnosis not present

## 2023-03-02 DIAGNOSIS — E785 Hyperlipidemia, unspecified: Secondary | ICD-10-CM | POA: Diagnosis not present

## 2023-03-02 DIAGNOSIS — K219 Gastro-esophageal reflux disease without esophagitis: Secondary | ICD-10-CM | POA: Diagnosis not present

## 2023-03-02 DIAGNOSIS — J45909 Unspecified asthma, uncomplicated: Secondary | ICD-10-CM | POA: Diagnosis not present

## 2023-03-10 ENCOUNTER — Other Ambulatory Visit: Payer: Self-pay

## 2023-03-10 ENCOUNTER — Other Ambulatory Visit (HOSPITAL_COMMUNITY): Payer: Self-pay

## 2023-03-10 NOTE — Progress Notes (Signed)
Remote ICD transmission.   

## 2023-03-15 ENCOUNTER — Other Ambulatory Visit (HOSPITAL_COMMUNITY): Payer: Self-pay

## 2023-03-15 ENCOUNTER — Other Ambulatory Visit: Payer: Self-pay | Admitting: Family Medicine

## 2023-03-15 ENCOUNTER — Other Ambulatory Visit: Payer: Self-pay

## 2023-03-15 MED ORDER — OZEMPIC (2 MG/DOSE) 8 MG/3ML ~~LOC~~ SOPN
2.0000 mg | PEN_INJECTOR | SUBCUTANEOUS | 2 refills | Status: DC
  Filled 2023-03-15: qty 3, 28d supply, fill #0
  Filled 2023-04-09: qty 3, 28d supply, fill #1
  Filled 2023-05-11: qty 3, 28d supply, fill #2

## 2023-04-05 ENCOUNTER — Other Ambulatory Visit: Payer: Self-pay

## 2023-04-05 ENCOUNTER — Other Ambulatory Visit (HOSPITAL_COMMUNITY): Payer: Self-pay

## 2023-04-05 ENCOUNTER — Other Ambulatory Visit (HOSPITAL_BASED_OUTPATIENT_CLINIC_OR_DEPARTMENT_OTHER): Payer: Self-pay | Admitting: Pulmonary Disease

## 2023-04-05 MED ORDER — MONTELUKAST SODIUM 10 MG PO TABS
10.0000 mg | ORAL_TABLET | Freq: Every day | ORAL | 1 refills | Status: DC
  Filled 2023-04-05: qty 90, 90d supply, fill #0
  Filled 2023-07-05: qty 90, 90d supply, fill #1

## 2023-04-08 ENCOUNTER — Encounter: Payer: Self-pay | Admitting: Family Medicine

## 2023-04-08 ENCOUNTER — Ambulatory Visit: Payer: Medicare HMO | Admitting: Family Medicine

## 2023-04-08 VITALS — BP 114/68 | HR 80 | Ht 66.0 in | Wt 187.2 lb

## 2023-04-08 DIAGNOSIS — E1140 Type 2 diabetes mellitus with diabetic neuropathy, unspecified: Secondary | ICD-10-CM

## 2023-04-08 DIAGNOSIS — Z794 Long term (current) use of insulin: Secondary | ICD-10-CM

## 2023-04-08 DIAGNOSIS — R197 Diarrhea, unspecified: Secondary | ICD-10-CM

## 2023-04-08 LAB — POCT GLYCOSYLATED HEMOGLOBIN (HGB A1C): HbA1c, POC (controlled diabetic range): 7.9 % — AB (ref 0.0–7.0)

## 2023-04-08 NOTE — Patient Instructions (Signed)
Good to see you today - Thank you for coming in  Things we discussed today:  1) For your diarrhea - Start to take a psyllium husk fiber supplement such as Metamucil. Take one serving with each meal.  - If your diarrhea is not improving after 1 week, you can increase to taking 2 servings with each meal  Come back to see me in 1 month.

## 2023-04-08 NOTE — Progress Notes (Signed)
    SUBJECTIVE:   CHIEF COMPLAINT: Ozempic issues HPI:   [name] is a 72 y.o.  with history notable for type 2 DM, HFpEF with ICD< and atrial fibrillation  presenting for "Ozempic issues".   - For about , has been having diarrhea off and on. It has been more progressing to be more frequent.  - Eating certain foods causes her to have urgency of stool.  Unclear if there is are any specific foods or patterns that consistently cause urgency. - Having some diarrhea incontinence. - Diarrhea is "mushy", but not watery.  - Denies abm pain, blood, N/V. - Also has occasional normal formed stools. - UTD on colonoscopy - Denies hx of stomach ulcers.  - Denies change in diet or new meds  - Has been on ozempic for a while, multiple years   PERTINENT  PMH / PSH/Family/Social History : Type 2 DM on sinuslin   OBJECTIVE:   BP 114/68   Pulse 80   Ht 5\' 6"  (1.676 m)   Wt 187 lb 4 oz (84.9 kg)   LMP 07/26/2011   SpO2 100%   BMI 30.22 kg/m   Today's weight:  Last Weight  Most recent update: 04/08/2023 11:04 AM    Weight  84.9 kg (187 lb 4 oz)            Review of prior weights: Filed Weights   04/08/23 1104  Weight: 187 lb 4 oz (84.9 kg)   General: Alert, pleasant woman. NAD. HEENT: NCAT. MMM. CV: RRR, no murmurs.  Resp: CTAB, no wheezing or crackles. Normal WOB on RA.  Abm: Soft, nontender, nondistended. BS present. Ext: Moves all ext spontaneously Skin: Warm, well perfused   ASSESSMENT/PLAN:   Assessment & Plan Type 2 diabetes mellitus with diabetic neuropathy, with long-term current use of insulin (HCC) A1c 7.9 today, stable.  Diarrhea, unspecified type Differential includes age related incontinence, IBS, diet related (just lactose intolerance).  Also considered malignancy, IBD, celiac, however these are less likely given normal UTD colonoscopy, lack of blood in stool, stable weight.  Reassuringly, abdominal exam benign and tolerating p.o. well.   - Advised to  start a psyllium husk fiber supplement such as Metamucil, and to take 1 serving with each meal.  If after 1 week her diarrhea is persisting, advised to take 2 servings of fiber with each meal. -Advised to keep a food diary.  Advised to know any patterns of foods or triggers that precede her diarrhea. -Follow-up in 1 month.  Monitor weight and assess for blood in stool.  Lincoln Brigham MD

## 2023-04-08 NOTE — Assessment & Plan Note (Signed)
A1c 7.9 today, stable.

## 2023-04-08 NOTE — Progress Notes (Deleted)
    SUBJECTIVE:   CHIEF COMPLAINT: Ozempic issues HPI:   [name] is a 72 y.o.  with history notable for type 2 DM, HFpEF with ICD< and atrial fibrillation  presenting for Ozempic issues.   Last A1C well controlled in mid-7s. She is on Farxiga, Tresiba 40, and Ozempic 2 mg. She reports ***.    PERTINENT  PMH / PSH/Family/Social History : Type 2 DM on sinuslin   OBJECTIVE:   LMP 07/26/2011   Today's weight:  Review of prior weights: There were no vitals filed for this visit.  ***  ASSESSMENT/PLAN:   Assessment & Plan    Terisa Starr, MD  Family Medicine Teaching Service  [facility]

## 2023-04-09 ENCOUNTER — Other Ambulatory Visit (HOSPITAL_COMMUNITY): Payer: Self-pay

## 2023-04-10 DIAGNOSIS — H5203 Hypermetropia, bilateral: Secondary | ICD-10-CM | POA: Diagnosis not present

## 2023-04-10 DIAGNOSIS — Z961 Presence of intraocular lens: Secondary | ICD-10-CM | POA: Diagnosis not present

## 2023-04-10 DIAGNOSIS — H5010 Unspecified exotropia: Secondary | ICD-10-CM | POA: Diagnosis not present

## 2023-04-10 DIAGNOSIS — H35342 Macular cyst, hole, or pseudohole, left eye: Secondary | ICD-10-CM | POA: Diagnosis not present

## 2023-04-10 DIAGNOSIS — H2513 Age-related nuclear cataract, bilateral: Secondary | ICD-10-CM | POA: Diagnosis not present

## 2023-04-10 DIAGNOSIS — H52223 Regular astigmatism, bilateral: Secondary | ICD-10-CM | POA: Diagnosis not present

## 2023-04-10 DIAGNOSIS — E119 Type 2 diabetes mellitus without complications: Secondary | ICD-10-CM | POA: Diagnosis not present

## 2023-04-10 DIAGNOSIS — H524 Presbyopia: Secondary | ICD-10-CM | POA: Diagnosis not present

## 2023-04-12 ENCOUNTER — Ambulatory Visit: Payer: Medicare HMO | Attending: Cardiovascular Disease

## 2023-04-12 DIAGNOSIS — I5032 Chronic diastolic (congestive) heart failure: Secondary | ICD-10-CM

## 2023-04-12 DIAGNOSIS — Z95810 Presence of automatic (implantable) cardiac defibrillator: Secondary | ICD-10-CM | POA: Diagnosis not present

## 2023-04-20 DIAGNOSIS — J449 Chronic obstructive pulmonary disease, unspecified: Secondary | ICD-10-CM | POA: Diagnosis not present

## 2023-04-20 DIAGNOSIS — J45909 Unspecified asthma, uncomplicated: Secondary | ICD-10-CM | POA: Diagnosis not present

## 2023-04-20 DIAGNOSIS — G4733 Obstructive sleep apnea (adult) (pediatric): Secondary | ICD-10-CM | POA: Diagnosis not present

## 2023-04-26 ENCOUNTER — Other Ambulatory Visit: Payer: Self-pay | Admitting: Family Medicine

## 2023-04-26 ENCOUNTER — Other Ambulatory Visit (HOSPITAL_COMMUNITY): Payer: Self-pay

## 2023-04-26 ENCOUNTER — Other Ambulatory Visit: Payer: Self-pay

## 2023-04-26 ENCOUNTER — Other Ambulatory Visit: Payer: Self-pay | Admitting: Pulmonary Disease

## 2023-04-26 MED ORDER — FAMOTIDINE 20 MG PO TABS
20.0000 mg | ORAL_TABLET | Freq: Every day | ORAL | 3 refills | Status: DC
  Filled 2023-04-26: qty 90, 90d supply, fill #0
  Filled 2023-07-21: qty 90, 90d supply, fill #1
  Filled 2023-10-21: qty 90, 90d supply, fill #2
  Filled 2024-01-17: qty 90, 90d supply, fill #3

## 2023-04-27 ENCOUNTER — Ambulatory Visit: Payer: Medicare HMO | Admitting: Podiatry

## 2023-04-27 ENCOUNTER — Other Ambulatory Visit: Payer: Self-pay

## 2023-04-27 ENCOUNTER — Encounter: Payer: Self-pay | Admitting: Podiatry

## 2023-04-27 DIAGNOSIS — Q828 Other specified congenital malformations of skin: Secondary | ICD-10-CM

## 2023-04-27 DIAGNOSIS — E1142 Type 2 diabetes mellitus with diabetic polyneuropathy: Secondary | ICD-10-CM

## 2023-04-27 DIAGNOSIS — B351 Tinea unguium: Secondary | ICD-10-CM | POA: Diagnosis not present

## 2023-04-27 DIAGNOSIS — M79609 Pain in unspecified limb: Secondary | ICD-10-CM

## 2023-04-28 ENCOUNTER — Ambulatory Visit: Payer: Self-pay | Admitting: Family Medicine

## 2023-04-29 ENCOUNTER — Ambulatory Visit
Admission: RE | Admit: 2023-04-29 | Discharge: 2023-04-29 | Disposition: A | Discharge status: Home / Self Care | Payer: Medicare HMO | Source: Ambulatory Visit | Attending: Family Medicine | Admitting: Family Medicine

## 2023-04-29 DIAGNOSIS — M8580 Other specified disorders of bone density and structure, unspecified site: Secondary | ICD-10-CM

## 2023-04-29 DIAGNOSIS — Z780 Asymptomatic menopausal state: Secondary | ICD-10-CM

## 2023-04-29 DIAGNOSIS — E2839 Other primary ovarian failure: Secondary | ICD-10-CM | POA: Diagnosis not present

## 2023-04-29 DIAGNOSIS — N958 Other specified menopausal and perimenopausal disorders: Secondary | ICD-10-CM | POA: Diagnosis not present

## 2023-04-29 DIAGNOSIS — M8588 Other specified disorders of bone density and structure, other site: Secondary | ICD-10-CM | POA: Diagnosis not present

## 2023-05-03 NOTE — Progress Notes (Signed)
 Subjective:  Patient ID: [name], female    DOB: Dec 10, 1950,  MRN: [id number]  73 y.o. female presents with at risk foot care with history of diabetic neuropathy and painful porokeratotic lesion(s) of both feet and painful mycotic toenails that limit ambulation. Painful toenails interfere with ambulation. Aggravating factors include wearing enclosed shoe gear. Pain is relieved with periodic professional debridement. Painful porokeratotic lesions are aggravated when weightbearing with and without shoegear. Pain is relieved with periodic professional debridement. Chief Complaint  Patient presents with   Diabetes    Patient knows her PCP name and also last seen him 3 weeks ago she states . Patient A1c is 7.9     PCP: Romelle Booty, MD.  New problem(s): None.   Review of Systems: Negative except as noted in the HPI.   Allergies  Allergen Reactions   Avelox [Moxifloxacin Hcl In Nacl] Other (See Comments)    Cardiac arrest per pt   Nsaids Other (See Comments)    Cardiac arrest per pt    Simvastatin Other (See Comments)    Muscle pain   Ace Inhibitors Other (See Comments) and Cough   Jardiance  [Empagliflozin ] Other (See Comments)    Felt crazy, fatigue, sweating, denies hypoglycemia while taking   Latex Rash    Objective:  There were no vitals filed for this visit. Constitutional Patient is a pleasant 73 y.o. female WD, WN in NAD. AAO x 3.  Vascular Capillary fill time to digits <3 seconds.  DP/PT pulse(s) are faintly palpable b/l lower extremities. Pedal hair absent b/l. Lower extremity skin temperature gradient warm to cool b/l. No pain with calf compression b/l. No cyanosis or clubbing noted. No ischemia nor gangrene noted b/l.   Neurologic Protective sensation intact 5/5 intact bilaterally with 10g monofilament b/l. Vibratory sensation intact b/l. No clonus b/l. Pt has subjective symptoms of neuropathy.  Dermatologic Pedal skin is thin, shiny and atrophic b/l.  No open wounds  b/l lower extremities. No interdigital macerations b/l lower extremities. Toenails 1-5 b/l elongated, discolored, dystrophic, thickened, crumbly with subungual debris and tenderness to dorsal palpation. Porokeratotic lesion(s) bilateral great toes, submet head 2 right foot, submet head 3 b/l, and 1st metatarsal head of both feet. No erythema, no edema, no drainage, no fluctuance.  Orthopedic: Normal muscle strength 5/5 to all lower extremity muscle groups bilaterally. Hammertoe deformity noted 2-5 b/l.   Last HgA1c:     Latest Ref Rng & Units 04/08/2023   10:57 AM 10/30/2022    8:40 AM 07/14/2022    8:51 AM  Hemoglobin A1C  Hemoglobin-A1c 0.0 - 7.0 % 7.9  7.5  7.5      Assessment:   1. Pain due to onychomycosis of nail   2. Porokeratosis   3. Diabetic polyneuropathy associated with type 2 diabetes mellitus (HCC)    Plan:  -Consent given for treatment as described below: -Examined patient. -Patient to continue soft, supportive shoe gear daily. -Mycotic toenails 1-5 bilaterally were debrided in length and girth with sterile nail nippers and dremel without incident. -Porokeratotic lesion(s) bilateral great toes, submet head 2 right foot, submet head 3 b/l, and 1st metatarsal head b/l feet pared and enucleated with sterile currette without incident. Total number of lesions debrided=7. -Patient/POA to call should there be question/concern in the interim.  Return in about 3 months (around 07/26/2023).  Delon LITTIE Merlin, DPM      [place] LOCATION: [address]  [place], [place] [address]                   Office [phone]   [facility] LOCATION: [address] [place], [place] [address] Office [phone]

## 2023-05-05 ENCOUNTER — Other Ambulatory Visit (HOSPITAL_COMMUNITY): Payer: Self-pay

## 2023-05-05 ENCOUNTER — Other Ambulatory Visit: Payer: Self-pay | Admitting: Family Medicine

## 2023-05-05 DIAGNOSIS — E1140 Type 2 diabetes mellitus with diabetic neuropathy, unspecified: Secondary | ICD-10-CM

## 2023-05-06 ENCOUNTER — Other Ambulatory Visit: Payer: Self-pay

## 2023-05-06 MED ORDER — DAPAGLIFLOZIN PROPANEDIOL 10 MG PO TABS
10.0000 mg | ORAL_TABLET | Freq: Every day | ORAL | 0 refills | Status: DC
  Filled 2023-05-06: qty 90, 90d supply, fill #0

## 2023-05-06 MED ORDER — TRESIBA FLEXTOUCH 100 UNIT/ML ~~LOC~~ SOPN
40.0000 [IU] | PEN_INJECTOR | Freq: Every day | SUBCUTANEOUS | 2 refills | Status: DC
  Filled 2023-05-06: qty 15, 37d supply, fill #0
  Filled 2023-06-14: qty 15, 37d supply, fill #1
  Filled 2023-07-21: qty 15, 37d supply, fill #2

## 2023-05-07 ENCOUNTER — Ambulatory Visit (HOSPITAL_COMMUNITY)
Admission: RE | Admit: 2023-05-07 | Discharge: 2023-05-07 | Disposition: A | Discharge status: Home / Self Care | Payer: Medicare HMO | Source: Ambulatory Visit | Attending: Physician Assistant | Admitting: Physician Assistant

## 2023-05-07 ENCOUNTER — Encounter (HOSPITAL_COMMUNITY): Payer: Self-pay | Admitting: Physician Assistant

## 2023-05-07 VITALS — BP 112/70 | HR 86 | Ht 66.0 in | Wt 190.0 lb

## 2023-05-07 DIAGNOSIS — Z7985 Long-term (current) use of injectable non-insulin antidiabetic drugs: Secondary | ICD-10-CM | POA: Diagnosis not present

## 2023-05-07 DIAGNOSIS — Z7901 Long term (current) use of anticoagulants: Secondary | ICD-10-CM | POA: Insufficient documentation

## 2023-05-07 DIAGNOSIS — I480 Paroxysmal atrial fibrillation: Secondary | ICD-10-CM | POA: Insufficient documentation

## 2023-05-07 DIAGNOSIS — Z5181 Encounter for therapeutic drug level monitoring: Secondary | ICD-10-CM | POA: Insufficient documentation

## 2023-05-07 DIAGNOSIS — I5032 Chronic diastolic (congestive) heart failure: Secondary | ICD-10-CM | POA: Insufficient documentation

## 2023-05-07 DIAGNOSIS — Z79899 Other long term (current) drug therapy: Secondary | ICD-10-CM | POA: Insufficient documentation

## 2023-05-07 DIAGNOSIS — N182 Chronic kidney disease, stage 2 (mild): Secondary | ICD-10-CM | POA: Insufficient documentation

## 2023-05-07 DIAGNOSIS — D6869 Other thrombophilia: Secondary | ICD-10-CM | POA: Diagnosis not present

## 2023-05-07 DIAGNOSIS — E1122 Type 2 diabetes mellitus with diabetic chronic kidney disease: Secondary | ICD-10-CM | POA: Insufficient documentation

## 2023-05-07 DIAGNOSIS — G4733 Obstructive sleep apnea (adult) (pediatric): Secondary | ICD-10-CM | POA: Insufficient documentation

## 2023-05-07 DIAGNOSIS — Z95810 Presence of automatic (implantable) cardiac defibrillator: Secondary | ICD-10-CM | POA: Diagnosis not present

## 2023-05-07 DIAGNOSIS — I130 Hypertensive heart and chronic kidney disease with heart failure and stage 1 through stage 4 chronic kidney disease, or unspecified chronic kidney disease: Secondary | ICD-10-CM | POA: Insufficient documentation

## 2023-05-07 LAB — BASIC METABOLIC PANEL
Anion gap: 10 (ref 5–15)
BUN: 19 mg/dL (ref 8–23)
CO2: 23 mmol/L (ref 22–32)
Calcium: 8.5 mg/dL — ABNORMAL LOW (ref 8.9–10.3)
Chloride: 103 mmol/L (ref 98–111)
Creatinine, Ser: 1.27 mg/dL — ABNORMAL HIGH (ref 0.44–1.00)
GFR, Estimated: 45 mL/min — ABNORMAL LOW (ref >60)
Glucose, Bld: 165 mg/dL — ABNORMAL HIGH (ref 70–99)
Potassium: 3.9 mmol/L (ref 3.5–5.1)
Sodium: 136 mmol/L (ref 135–145)

## 2023-05-07 LAB — MAGNESIUM: Magnesium: 2.2 mg/dL (ref 1.7–2.4)

## 2023-05-07 NOTE — Progress Notes (Signed)
Primary Care Physician: [name], MD Primary Cardiologist: Thurmon Fair, MD Electrophysiologist: None  Referring Physician: Dr Regan Rakers is a 73 y.o. female with a history of VT s/p ICD, HFimpEF, OSA, DM, CKD, HLD, HTN, atrial fibrillation who presents for follow up in the [facility]. She has been maintained on dofetilide for her afib and has done well with the medication. Her last remote device check on 04/12/23 showed 1.6% afib burden. She denies any bleeding issues on anticoagulation. She remains active and goes to the gym regularly.   Today, she denies symptoms of palpitations, chest pain, shortness of breath, orthopnea, PND, lower extremity edema, dizziness, presyncope, syncope, snoring, daytime somnolence, bleeding, or neurologic sequela. The patient is tolerating medications without difficulties and is otherwise without complaint today.    Atrial Fibrillation Risk Factors:  she does have symptoms or diagnosis of sleep apnea. she does not have a history of rheumatic fever.   Atrial Fibrillation Management history:  Previous antiarrhythmic drugs: dofetilide  Previous cardioversions: none Previous ablations: none Anticoagulation history: Eliquis  ROS- All systems are reviewed and negative except as per the HPI above.  Past Medical History:  Diagnosis Date   AICD (automatic cardioverter/defibrillator) present    Anemia    Anxiety    Arthritis    Arthritis of carpometacarpal (CMC) joint of thumb 03/26/2016   Asthma    has had multiple hospitalizations for this   Asthma, moderate persistent 12/10/2011   Atrial fibrillation (HCC)    ablation x 2 [facility], 01/2006, 2011.  on warfarin   Cardiac arrest (HCC) 04/2010   in hospital for pneumonia when this occured- occured at the hospital   CHF (congestive heart failure) (HCC) 2012   Echo 08/08/10 by [facility]. EF 35-45%. LV systolic function moderately reduced. Moderate  global hypokinesis of LV.  RV systolic function moderately reduced. Mild MR. Trace AR.   Chronic diastolic heart failure (HCC) 01/07/2018   CKD (chronic kidney disease) stage 2, GFR 60-89 ml/min 02/22/2012   Her cr range 1.2-1.5 since Jan 2012 after hospitalization. 10/13 Estimated Creatinine Clearance: 69.3 ml/min (by C-G formula based on Cr of 1).     Complication of anesthesia    difficult time waking up after anesthesia   Depression    Diabetes mellitus    Type 2   Diabetic neuropathy (HCC) 09/18/2011   GASTROESOPHAGEAL REFLUX, NO ESOPHAGITIS 06/10/2006   Qualifier: Diagnosis of  By: Abundio Miu     GERD (gastroesophageal reflux disease) 03/23/2018   History of hiatal hernia    History of kidney stones    HYPERCHOLESTEROLEMIA 06/10/2006   LDL 93 at 01/2011 check-  Continue pravastatin 20mg  po daily.  --discuss health modifications and possibly increaseing dose at next appt.  Cardiology- Dr little- stopped pravachol on 05/21/11- 2/2 allergies?     Hyperlipidemia    Hypertension    ICD (St. Jude Protecta dual-chamber),secondary prevention (VF arrest) January 2012 11/19/2012   Iliotibial band syndrome, left 11/01/2018   Limb pain 06/04/2008   LLE, Baker's cyst in popliteal fossa, no DVT   Long term current use of anticoagulant therapy 08/21/2015   Non-ischemic cardiomyopathy (HCC)    echo 08/08/10 - EF 35-45% LV and RV systolic function mod reduced   Obesity (BMI 30-39.9) 01/04/2014   OSA on CPAP 06/10/2006   Sleep study 02/2014 : severe apnea, corrected with nasal pillows and 8cmh2o     Osteoarthritis of right knee 08/28/2008  Qualifier: Diagnosis of  By: Pearletha Forge MD, Vincenza Hews     Primary localized osteoarthritis of right knee    Sleep apnea    wears CPAP nightly   Type 2 diabetes mellitus with diabetic neuropathy, with long-term current use of insulin (HCC) 06/10/2006              Ventricular tachycardia (HCC) 05/22/2015   Visit for monitoring Tikosyn therapy 08/21/2015    Vocal cord disease     Current Outpatient Medications  Medication Sig Dispense Refill   acetaminophen (TYLENOL 8 HOUR) 650 MG CR tablet Take 1 tablet (650 mg total) by mouth every 8 (eight) hours as needed for pain. 90 tablet 2   apixaban (ELIQUIS) 5 MG TABS tablet Take 1 tablet (5 mg total) by mouth 2 (two) times daily. 180 tablet 1   bisoprolol (ZEBETA) 10 MG tablet Take 1 tablet (10 mg total) by mouth in the morning and at bedtime. 180 tablet 3   BOOSTRIX 5-2.5-18.5 LF-MCG/0.5 injection      budesonide-formoterol (SYMBICORT) 80-4.5 MCG/ACT inhaler INHALE 2 PUFFS INTO THE LUNGS IN THE MORNING AND AT BEDTIME 10.2 g 11   buPROPion (WELLBUTRIN XL) 150 MG 24 hr tablet Take 1 tablet (150 mg total) by mouth daily. 90 tablet 3   dapagliflozin propanediol (FARXIGA) 10 MG TABS tablet Take 1 tablet (10 mg total) by mouth daily. 90 tablet 0   diclofenac Sodium (VOLTAREN) 1 % GEL Apply 2 g topically 4 (four) times daily. 100 g 1   dofetilide (TIKOSYN) 250 MCG capsule Take 1 capsule (250 mcg total) by mouth 2 (two) times daily. 180 capsule 2   famotidine (PEPCID) 20 MG tablet Take 1 tablet (20 mg total) by mouth daily. 90 tablet 3   fluticasone (FLONASE) 50 MCG/ACT nasal spray instill 1 spray into each nostril twice a day 16 g 5   FLUZONE HIGH-DOSE 0.5 ML injection      gabapentin (NEURONTIN) 100 MG capsule Take 1 capsule (100 mg total) by mouth at bedtime. After 3-4 days, you can increase to 200mg  nightly if needed. After 3-4 days, you can increase again to 300mg  nightly if needed. 90 capsule 0   glucose blood test strip Use as instructed 100 each 12   insulin degludec (TRESIBA FLEXTOUCH) 100 UNIT/ML FlexTouch Pen Inject 40 Units into the skin daily. 15 mL 2   Insulin Pen Needle (BD PEN NEEDLE NANO U/F) 32G X 4 MM MISC Use as directed with insulin once daily 100 each 2   levalbuterol (XOPENEX HFA) 45 MCG/ACT inhaler Inhale 1-2 puffs into the lungs every 4 (four) hours as needed for wheezing 15 g 1    losartan (COZAAR) 25 MG tablet Take 1 tablet (25 mg total) by mouth at bedtime. 90 tablet 0   montelukast (SINGULAIR) 10 MG tablet Take 1 tablet (10 mg) by mouth at bedtime. 90 tablet 1   potassium chloride (KLOR-CON M) 10 MEQ tablet Take 1 tablet (10 mEq total) by mouth daily as directed 90 tablet 2   PRODIGY LANCETS 28G MISC 1 Units by Does not apply route 4 (four) times daily. 100 each 4   rosuvastatin (CRESTOR) 10 MG tablet Take 1 tablet (10 mg total) by mouth daily. 90 tablet 2   Semaglutide, 2 MG/DOSE, (OZEMPIC, 2 MG/DOSE,) 8 MG/3ML SOPN Inject 2 mg into the skin once a week. 3 mL 2   torsemide (DEMADEX) 20 MG tablet Take 1 tablet (20 mg total) by mouth daily. 90 tablet 1  No current facility-administered medications for this encounter.    Physical Exam: BP 112/70   Pulse 86   Ht 5\' 6"  (1.676 m)   Wt 86.2 kg   LMP 07/26/2011   BMI 30.67 kg/m   GEN: Well nourished, well developed in no acute distress CARDIAC: Regular rate and rhythm, no murmurs, rubs, gallops RESPIRATORY:  Clear to auscultation without rales, wheezing or rhonchi  ABDOMEN: Soft, non-tender, non-distended EXTREMITIES:  No edema; No deformity   Wt Readings from Last 3 Encounters:  05/07/23 86.2 kg  04/08/23 84.9 kg  01/11/23 84.4 kg     EKG today demonstrates  A paced rhythm Vent. rate 86 BPM PR interval 200 ms QRS duration 84 ms QT/QTcB 398/476 ms  Echo 09/19/18 demonstrated   1. The left ventricle has normal systolic function, with an ejection  fraction of 55-60%. The cavity size was normal. There is mild asymmetric  left ventricular hypertrophy. Left ventricular diastolic Doppler  parameters are consistent with impaired relaxation. No evidence of left ventricular regional wall motion abnormalities.   2. The average left ventricular global longitudinal strain is 14.9 %.   3. The right ventricle has normal systolic function. The cavity was  normal. There is no increase in right ventricular wall  thickness.   4. Left atrial size was mildly dilated.   5. The aortic valve is tricuspid. Mild sclerosis of the aortic valve.  Aortic valve regurgitation is trivial by color flow Doppler.    CHA2DS2-VASc Score = 5  The patient's score is based upon: CHF History: 1 HTN History: 1 Diabetes History: 1 Stroke History: 0 Vascular Disease History: 0 Age Score: 1 Gender Score: 1       ASSESSMENT AND PLAN: Paroxysmal Atrial Fibrillation (ICD10:  I48.0) The patient's CHA2DS2-VASc score is 5, indicating a 7.2% annual risk of stroke.   Patient appears to be maintaining SR Continue dofetilide 250 mcg BID Continue Eliquis 5 mg BID Continue bisoprolol 10 mg BID  Secondary Hypercoagulable State (ICD10:  D68.69) The patient is at significant risk for stroke/thromboembolism based upon her CHA2DS2-VASc Score of 5.  Continue Apixaban (Eliquis).   High Risk Medication Monitoring (ICD 10: Z79.899) QT interval on ECG appropriate for dofetilide Check bmet/mag today  OSA  Encouraged nightly CPAP  HTN Stable on current regimen  HFimpEF EF 55-60% Followed by Dr Royann Shivers  Fluid status appears stable today  VT S/p ICD, followed by Dr Royann Shivers Continue bisoprolol 10 mg BID   Follow up with Dr Royann Shivers as scheduled. AF clinic in one year.        Jorja Loa PA-C Afib [facility] [address] [place], [place] [address] [phone]

## 2023-05-11 ENCOUNTER — Other Ambulatory Visit: Payer: Self-pay

## 2023-05-17 ENCOUNTER — Ambulatory Visit: Payer: Medicare HMO | Attending: Cardiovascular Disease

## 2023-05-17 DIAGNOSIS — I5032 Chronic diastolic (congestive) heart failure: Secondary | ICD-10-CM

## 2023-05-17 DIAGNOSIS — Z95810 Presence of automatic (implantable) cardiac defibrillator: Secondary | ICD-10-CM | POA: Diagnosis not present

## 2023-05-19 ENCOUNTER — Ambulatory Visit: Payer: Medicare HMO

## 2023-05-19 DIAGNOSIS — I480 Paroxysmal atrial fibrillation: Secondary | ICD-10-CM

## 2023-05-19 LAB — CUP PACEART REMOTE DEVICE CHECK
Battery Remaining Longevity: 33 mo
Battery Voltage: 2.95 V
Brady Statistic AP VP Percent: 0.52 %
Brady Statistic AP VS Percent: 96.62 %
Brady Statistic AS VP Percent: 0.12 %
Brady Statistic AS VS Percent: 2.74 %
Brady Statistic RA Percent Paced: 95.42 %
Brady Statistic RV Percent Paced: 0.73 %
Date Time Interrogation Session: 20250205022822
HighPow Impedance: 70 Ohm
Implantable Lead Connection Status: 753985
Implantable Lead Connection Status: 753985
Implantable Lead Implant Date: 20120120
Implantable Lead Implant Date: 20120120
Implantable Lead Location: 753859
Implantable Lead Location: 753860
Implantable Lead Model: 5076
Implantable Lead Model: 6935
Implantable Pulse Generator Implant Date: 20190618
Lead Channel Impedance Value: 323 Ohm
Lead Channel Impedance Value: 380 Ohm
Lead Channel Impedance Value: 399 Ohm
Lead Channel Pacing Threshold Amplitude: 0.375 V
Lead Channel Pacing Threshold Amplitude: 0.875 V
Lead Channel Pacing Threshold Pulse Width: 0.4 ms
Lead Channel Pacing Threshold Pulse Width: 0.4 ms
Lead Channel Sensing Intrinsic Amplitude: 0.875 mV
Lead Channel Sensing Intrinsic Amplitude: 0.875 mV
Lead Channel Sensing Intrinsic Amplitude: 9.875 mV
Lead Channel Sensing Intrinsic Amplitude: 9.875 mV
Lead Channel Setting Pacing Amplitude: 2 V
Lead Channel Setting Pacing Amplitude: 2.5 V
Lead Channel Setting Pacing Pulse Width: 0.4 ms
Lead Channel Setting Sensing Sensitivity: 0.3 mV
Zone Setting Status: 755011
Zone Setting Status: 755011

## 2023-05-22 ENCOUNTER — Encounter: Payer: Self-pay | Admitting: Cardiovascular Disease

## 2023-05-24 ENCOUNTER — Ambulatory Visit: Payer: Medicare HMO

## 2023-05-24 DIAGNOSIS — M2141 Flat foot [pes planus] (acquired), right foot: Secondary | ICD-10-CM

## 2023-05-24 DIAGNOSIS — L84 Corns and callosities: Secondary | ICD-10-CM

## 2023-05-24 DIAGNOSIS — Z794 Long term (current) use of insulin: Secondary | ICD-10-CM

## 2023-05-24 DIAGNOSIS — M2041 Other hammer toe(s) (acquired), right foot: Secondary | ICD-10-CM

## 2023-05-24 NOTE — Progress Notes (Signed)
 Patient presents to the office today for diabetic shoe and insole measuring.  Patient was measured with brannock device to determine size and width for 1 pair of extra depth shoes and foam casted for 3 pair of insoles.   Documentation of medical necessity will be sent to patient's treating diabetic doctor to verify and sign.   Patient's diabetic provider: Edison Gore MD / Dr Denece Finger 04/27/23  Shoes and insoles will be ordered at that time and patient will be notified for an appointment for fitting when they arrive.   Brannock measurement: 10.40M Shoe choice:   \ [phone] / [employer] Brooks Neurosurgeon size ordered: 64M

## 2023-05-31 ENCOUNTER — Other Ambulatory Visit: Payer: Self-pay | Admitting: Cardiovascular Disease

## 2023-05-31 ENCOUNTER — Other Ambulatory Visit: Payer: Self-pay | Admitting: Family Medicine

## 2023-05-31 ENCOUNTER — Other Ambulatory Visit: Payer: Self-pay

## 2023-05-31 ENCOUNTER — Other Ambulatory Visit (HOSPITAL_COMMUNITY): Payer: Self-pay

## 2023-05-31 DIAGNOSIS — I480 Paroxysmal atrial fibrillation: Secondary | ICD-10-CM

## 2023-05-31 DIAGNOSIS — E1140 Type 2 diabetes mellitus with diabetic neuropathy, unspecified: Secondary | ICD-10-CM

## 2023-05-31 MED ORDER — LOSARTAN POTASSIUM 25 MG PO TABS
25.0000 mg | ORAL_TABLET | Freq: Every day | ORAL | 0 refills | Status: DC
  Filled 2023-05-31: qty 90, 90d supply, fill #0

## 2023-06-02 ENCOUNTER — Other Ambulatory Visit: Payer: Self-pay

## 2023-06-02 ENCOUNTER — Other Ambulatory Visit (HOSPITAL_COMMUNITY): Payer: Self-pay

## 2023-06-02 MED ORDER — APIXABAN 5 MG PO TABS
5.0000 mg | ORAL_TABLET | Freq: Two times a day (BID) | ORAL | 1 refills | Status: DC
  Filled 2023-06-02: qty 180, 90d supply, fill #0
  Filled 2023-08-25: qty 180, 90d supply, fill #1

## 2023-06-02 MED ORDER — ROSUVASTATIN CALCIUM 10 MG PO TABS
10.0000 mg | ORAL_TABLET | Freq: Every day | ORAL | 2 refills | Status: DC
  Filled 2023-06-02: qty 90, 90d supply, fill #0
  Filled 2023-08-25: qty 90, 90d supply, fill #1
  Filled 2023-11-19: qty 90, 90d supply, fill #2

## 2023-06-02 NOTE — Telephone Encounter (Signed)
Prescription refill request for Eliquis received. Indication:afib Last office visit:1/25 Scr:1.27  1/25 Age: 73 Weight:86.2  kg  Prescription rerfilled

## 2023-06-03 ENCOUNTER — Ambulatory Visit: Payer: Medicare HMO | Admitting: Cardiovascular Disease

## 2023-06-07 ENCOUNTER — Other Ambulatory Visit: Payer: Self-pay

## 2023-06-07 ENCOUNTER — Other Ambulatory Visit: Payer: Self-pay | Admitting: Family Medicine

## 2023-06-07 ENCOUNTER — Other Ambulatory Visit (HOSPITAL_COMMUNITY): Payer: Self-pay

## 2023-06-07 MED ORDER — OZEMPIC (2 MG/DOSE) 8 MG/3ML ~~LOC~~ SOPN
2.0000 mg | PEN_INJECTOR | SUBCUTANEOUS | 2 refills | Status: DC
  Filled 2023-06-07: qty 3, 28d supply, fill #0
  Filled 2023-07-02: qty 3, 28d supply, fill #1
  Filled 2023-07-29: qty 3, 28d supply, fill #2

## 2023-06-14 ENCOUNTER — Other Ambulatory Visit (HOSPITAL_COMMUNITY): Payer: Self-pay

## 2023-06-21 ENCOUNTER — Ambulatory Visit: Payer: Medicare HMO | Attending: Cardiovascular Disease

## 2023-06-21 DIAGNOSIS — I5032 Chronic diastolic (congestive) heart failure: Secondary | ICD-10-CM

## 2023-06-21 DIAGNOSIS — Z95810 Presence of automatic (implantable) cardiac defibrillator: Secondary | ICD-10-CM | POA: Diagnosis not present

## 2023-06-23 NOTE — Progress Notes (Signed)
 EPIC Encounter for ICM Monitoring  Patient Name: [name] is a 73 y.o. female Date: 06/23/2023 Primary Care Physican: [name], MD Primary Cardiologist: Croitoru Electrophysiologist: Croitoru 02/26/2023 Weight: 182 lbs 05/24/2023 Weight: 184-185 lbs 06/23/2023 Weight: 184 lbs                 Since 19-May-2023 AT/AF  27 Time in AT/AF  0.3 hr/day (1.3%) Longest AT/AF  97 minutes                                  Spoke with patient and heart failure questions reviewed.  Transmission results reviewed.  Pt asymptomatic for fluid accumulation.  Re ports feeling well at this time and voices no complaints.     Optivol thoracic impedance suggesting normal fluid levels within the last month.     Prescribed dosage:  Torsemide 20 mg 1 tablet by mouth daily.   Potassium 10 mEq 1 tablet by mouth by mouth daily.   Labs: 08/24/2022 Creatinine 1.34, BUN 19, Potassium 4.8, Sodium 139, GFR 42 05/05/2022 Creatinine 1.12, BUN 15, Potassium 4.5, Sodium 136 A complete set of results can be found in Results Review.   Recommendations:   No changes and encouraged to call if experiencing any fluid symptoms.   Follow-up plan: ICM clinic phone appointment on 07/26/2023.   91 day device clinic remote transmission 08/18/2023.     Next office visit scheduled: 07/21/2023 with Dr Royann Shivers.     Copy of ICM check sent to Dr. Royann Shivers.   3 month ICM trend: 06/21/2023.    12-14 Month ICM trend:     [name], RN 06/23/2023 2:05 PM

## 2023-06-25 NOTE — Progress Notes (Signed)
 Remote ICD transmission.

## 2023-07-02 ENCOUNTER — Other Ambulatory Visit (HOSPITAL_COMMUNITY): Payer: Self-pay

## 2023-07-05 ENCOUNTER — Other Ambulatory Visit (HOSPITAL_COMMUNITY): Payer: Self-pay

## 2023-07-19 DIAGNOSIS — G4733 Obstructive sleep apnea (adult) (pediatric): Secondary | ICD-10-CM | POA: Diagnosis not present

## 2023-07-19 DIAGNOSIS — J45909 Unspecified asthma, uncomplicated: Secondary | ICD-10-CM | POA: Diagnosis not present

## 2023-07-19 DIAGNOSIS — J449 Chronic obstructive pulmonary disease, unspecified: Secondary | ICD-10-CM | POA: Diagnosis not present

## 2023-07-21 ENCOUNTER — Other Ambulatory Visit: Payer: Self-pay | Admitting: Cardiovascular Disease

## 2023-07-21 ENCOUNTER — Encounter: Payer: Self-pay | Admitting: Cardiovascular Disease

## 2023-07-21 ENCOUNTER — Other Ambulatory Visit (HOSPITAL_COMMUNITY): Payer: Self-pay

## 2023-07-21 ENCOUNTER — Ambulatory Visit: Payer: Medicare HMO | Attending: Cardiovascular Disease | Admitting: Cardiovascular Disease

## 2023-07-21 VITALS — BP 114/80 | HR 92 | Ht 66.0 in | Wt 183.8 lb

## 2023-07-21 DIAGNOSIS — N1831 Chronic kidney disease, stage 3a: Secondary | ICD-10-CM | POA: Diagnosis not present

## 2023-07-21 DIAGNOSIS — Z5181 Encounter for therapeutic drug level monitoring: Secondary | ICD-10-CM

## 2023-07-21 DIAGNOSIS — I472 Ventricular tachycardia, unspecified: Secondary | ICD-10-CM

## 2023-07-21 DIAGNOSIS — E663 Overweight: Secondary | ICD-10-CM | POA: Diagnosis not present

## 2023-07-21 DIAGNOSIS — Z79899 Other long term (current) drug therapy: Secondary | ICD-10-CM

## 2023-07-21 DIAGNOSIS — I5032 Chronic diastolic (congestive) heart failure: Secondary | ICD-10-CM | POA: Diagnosis not present

## 2023-07-21 DIAGNOSIS — E1140 Type 2 diabetes mellitus with diabetic neuropathy, unspecified: Secondary | ICD-10-CM

## 2023-07-21 DIAGNOSIS — J4540 Moderate persistent asthma, uncomplicated: Secondary | ICD-10-CM | POA: Diagnosis not present

## 2023-07-21 DIAGNOSIS — G4733 Obstructive sleep apnea (adult) (pediatric): Secondary | ICD-10-CM

## 2023-07-21 DIAGNOSIS — D6869 Other thrombophilia: Secondary | ICD-10-CM

## 2023-07-21 DIAGNOSIS — I480 Paroxysmal atrial fibrillation: Secondary | ICD-10-CM | POA: Diagnosis not present

## 2023-07-21 DIAGNOSIS — Z95810 Presence of automatic (implantable) cardiac defibrillator: Secondary | ICD-10-CM

## 2023-07-21 DIAGNOSIS — Z794 Long term (current) use of insulin: Secondary | ICD-10-CM

## 2023-07-21 DIAGNOSIS — I10 Essential (primary) hypertension: Secondary | ICD-10-CM | POA: Diagnosis not present

## 2023-07-21 NOTE — Patient Instructions (Signed)
 Medication Instructions:  No changes *If you need a refill on your cardiac medications before your next appointment, please call your pharmacy*  Follow-Up: At [facility], you and your health needs are our priority.  As part of our continuing mission to provide you with exceptional heart care, our providers are all part of one team.  This team includes your primary Cardiologist (physician) and Advanced Practice Providers or APPs (Physician Assistants and Nurse Practitioners) who all work together to provide you with the care you need, when you need it.  Your next appointment:    Afib Clinic- 6 months- they will call to schedule  Dr Royann Shivers in January 2026  We recommend signing up for the patient portal called "MyChart".  Sign up information is provided on this After Visit Summary.  MyChart is used to connect with patients for Virtual Visits (Telemedicine).  Patients are able to view lab/test results, encounter notes, upcoming appointments, etc.  Non-urgent messages can be sent to your provider as well.   To learn more about what you can do with MyChart, go to [email or web address].        1st Floor: - Lobby - Registration  - Pharmacy  - Lab - Cafe  2nd Floor: - [facility] Lab - Diagnostic Testing (echo, CT, nuclear med)  3rd Floor: - Vacant  4th Floor: - TCTS (cardiothoracic surgery) - AFib Clinic - Structural Heart Clinic - Vascular Surgery  - Vascular Ultrasound  5th Floor: - HeartCare Cardiology (general and EP) - Clinical Pharmacy for coumadin, hypertension, lipid, weight-loss medications, and med management appointments    Valet parking services will be available as well.

## 2023-07-21 NOTE — Progress Notes (Signed)
 Patient ID: [name], female   DOB: 03-Feb-1951, 73 y.o.   MRN: [id number] Patient ID: [name], female   DOB: 06-06-1950, 73 y.o.   MRN: [id number]    Cardiology Office Note    Date:  07/25/2023   ID:  [name], DOB [birth year], MRN [id number]  PCP:  Edison Gore, MD  Cardiologist:   Luana Rumple, MD   Chief Complaint  Patient presents with   ICD Check    History of Present Illness:  [name] is a 73 y.o. female with a long-standing history of nonischemic cardiomyopathy, chronic heart failure with recovered LV systolic function and paroxysmal atrial fibrillation, s/p ICD (Medtronic, gen change 2019), with appropriate previous treatment for rapid monomorphic ventricular tachycardia (July 27, 2022) successfully terminated with ATP during charge.    She feels well. Goes to gym regularly. Denies any chest pain at rest or with exertion, dyspnea at rest or with exertion, orthopnea, paroxysmal nocturnal dyspnea, syncope, palpitations, focal neurological deficits, intermittent claudication, lower extremity edema, unexplained weight gain, cough, hemoptysis or wheezing.   She is on chronic treatment with dofetilide.  QTc is 476 ms today.  Normal ICD function, presenting rhythm is AsVp (96%Ap with good histogram, 0.8% Vp). Afib burden is stable at 1.9%, occasionally w RVR, rare brief NSVT. Excellent lead parameters. Generator longevity about 2.5 years.  Reports compliance with CPAP.  Denies hypersomnolence during the daytime.   Metabolic control is not as good (A1c up to 7.9%) on metformin, GLP-1 agonist and SGLT2 inhibitor. Need to update her lipid profile, but when last checked, all values were great.  She had antitachycardia pacing for ventricular tachycardia in December 2016 and again in April 2022 and April 2024.  Otherwise she occasional brief bursts of nonsustained VT.. On July 19, 2020 and July 27, 2022 she had very fast VT (CL 280 ms), mostly monomorphic.  Each time the  arrhythmia was terminated with ATP.  She has a history of moderate nonischemic cardiomyopathy (no coronary disease by cardiac catheterization January 2012), recurrent paroxysmal atrial fibrillation status post 2 ablation procedures (Dr. Teofilo Fellers), with history of ventricular fibrillation arrest while on treatment with diltiazem and dofetilide and a prolonged QT interval, probably related to simultaneous quinolone therapy. She has a dual-chamber Medtronic Protecta defibrillator implanted in January 2012. In December 2016 her defibrillator recorded an episode of sustained monomorphic ventricular tachycardia. Antitachycardia pacing was unsuccessful at converting the arrhythmia, but she had a "dirty break" before defibrillator shock was delivered. The shock was aborted. Polymorphic VT lasting 2 seconds was recorded in the fall of 2017 during asthma exacerbation, but did not require intervention. Had sustained monomorphic VT on July 19, 2020. ATP successfully terminated the arrhythmia.  Most recent echo June 2020 showed normal LVEF 55-60%, but reduced GLS -14%. At LV angiography in 2012, EF was 15-20%.  Past Medical History:  Diagnosis Date   AICD (automatic cardioverter/defibrillator) present    Anemia    Anxiety    Arthritis    Arthritis of carpometacarpal (CMC) joint of thumb 03/26/2016   Asthma    has had multiple hospitalizations for this   Asthma, moderate persistent 12/10/2011   Atrial fibrillation (HCC)    ablation x 2 [facility], 01/2006, 2011.  on warfarin   Cardiac arrest (HCC) 04/2010   in hospital for pneumonia when this occured- occured at the hospital   CHF (congestive heart failure) (HCC) 2012   Echo 08/08/10 by [facility]. EF 35-45%. LV systolic function  moderately reduced. Moderate global hypokinesis of LV.  RV systolic function moderately reduced. Mild MR. Trace AR.   Chronic diastolic heart failure (HCC) 01/07/2018   CKD (chronic kidney disease) stage 2, GFR 60-89 ml/min  02/22/2012   Her cr range 1.2-1.5 since Jan 2012 after hospitalization. 10/13 Estimated Creatinine Clearance: 69.3 ml/min (by C-G formula based on Cr of 1).     Complication of anesthesia    difficult time waking up after anesthesia   Depression    Diabetes mellitus    Type 2   Diabetic neuropathy (HCC) 09/18/2011   GASTROESOPHAGEAL REFLUX, NO ESOPHAGITIS 06/10/2006   Qualifier: Diagnosis of  By: Gae Jointer     GERD (gastroesophageal reflux disease) 03/23/2018   History of hiatal hernia    History of kidney stones    HYPERCHOLESTEROLEMIA 06/10/2006   LDL 93 at 01/2011 check-  Continue pravastatin 20mg  po daily.  --discuss health modifications and possibly increaseing dose at next appt.  Cardiology- Dr little- stopped pravachol on 05/21/11- 2/2 allergies?     Hyperlipidemia    Hypertension    ICD (St. Jude Protecta dual-chamber),secondary prevention (VF arrest) January 2012 11/19/2012   Iliotibial band syndrome, left 11/01/2018   Limb pain 06/04/2008   LLE, Baker's cyst in popliteal fossa, no DVT   Long term current use of anticoagulant therapy 08/21/2015   Non-ischemic cardiomyopathy (HCC)    echo 08/08/10 - EF 35-45% LV and RV systolic function mod reduced   Obesity (BMI 30-39.9) 01/04/2014   OSA on CPAP 06/10/2006   Sleep study 02/2014 : severe apnea, corrected with nasal pillows and 8cmh2o     Osteoarthritis of right knee 08/28/2008   Qualifier: Diagnosis of  By: Peggy Bowens MD, Jimmye Moulds     Primary localized osteoarthritis of right knee    Sleep apnea    wears CPAP nightly   Type 2 diabetes mellitus with diabetic neuropathy, with long-term current use of insulin (HCC) 06/10/2006              Ventricular tachycardia (HCC) 05/22/2015   Visit for monitoring Tikosyn therapy 08/21/2015   Vocal cord disease     Past Surgical History:  Procedure Laterality Date   BREAST EXCISIONAL BIOPSY Right 1999   BREAST LUMPECTOMY Right    CARDIAC DEFIBRILLATOR PLACEMENT  05/02/10   Medtronic  Protecta XT-DR for CHF-VT, last download 04/12/12   CHOLECYSTECTOMY     COLONOSCOPY     HAMMER TOE SURGERY Right    ICD GENERATOR CHANGEOUT N/A 09/28/2017   Procedure: ICD GENERATOR CHANGEOUT;  Surgeon: Luana Rumple, MD;  Location: [facility];  Service: Cardiovascular;  Laterality: N/A;   KNEE ARTHROSCOPY Right    PAF Ablation     By Dr Harwood Lingo. Now sees Dr Darren Em at [facility]   TOTAL KNEE ARTHROPLASTY Left 09/17/2014   Procedure: TOTAL KNEE ARTHROPLASTY;  Surgeon: Elly Habermann, MD;  Location: [facility];  Service: Orthopedics;  Laterality: Left;  pt has ICD   TOTAL KNEE ARTHROPLASTY Right 04/28/2021   Procedure: TOTAL KNEE ARTHROPLASTY;  Surgeon: Murleen Arms, MD;  Location: [facility] ORS;  Service: Orthopedics;  Laterality: Right;   TUBAL LIGATION      Outpatient Medications Prior to Visit  Medication Sig Dispense Refill   acetaminophen (TYLENOL 8 HOUR) 650 MG CR tablet Take 1 tablet (650 mg total) by mouth every 8 (eight) hours as needed for pain. 90 tablet 2   apixaban (ELIQUIS) 5 MG TABS tablet Take 1 tablet (5 mg total) by mouth  2 (two) times daily. 180 tablet 1   bisoprolol (ZEBETA) 10 MG tablet Take 1 tablet (10 mg total) by mouth in the morning and at bedtime. 180 tablet 3   budesonide-formoterol (SYMBICORT) 80-4.5 MCG/ACT inhaler INHALE 2 PUFFS INTO THE LUNGS IN THE MORNING AND AT BEDTIME 10.2 g 11   buPROPion (WELLBUTRIN XL) 150 MG 24 hr tablet Take 1 tablet (150 mg total) by mouth daily. 90 tablet 3   dapagliflozin propanediol (FARXIGA) 10 MG TABS tablet Take 1 tablet (10 mg total) by mouth daily. 90 tablet 0   diclofenac Sodium (VOLTAREN) 1 % GEL Apply 2 g topically 4 (four) times daily. 100 g 1   dofetilide (TIKOSYN) 250 MCG capsule Take 1 capsule (250 mcg total) by mouth 2 (two) times daily. 180 capsule 2   famotidine (PEPCID) 20 MG tablet Take 1 tablet (20 mg total) by mouth daily. 90 tablet 3   fluticasone (FLONASE) 50 MCG/ACT nasal spray instill 1 spray into each  nostril twice a day 16 g 5   glucose blood test strip Use as instructed 100 each 12   insulin degludec (TRESIBA FLEXTOUCH) 100 UNIT/ML FlexTouch Pen Inject 40 Units into the skin daily. 15 mL 2   Insulin Pen Needle (BD PEN NEEDLE NANO U/F) 32G X 4 MM MISC Use as directed with insulin once daily 100 each 2   levalbuterol (XOPENEX HFA) 45 MCG/ACT inhaler Inhale 1-2 puffs into the lungs every 4 (four) hours as needed for wheezing 15 g 1   losartan (COZAAR) 25 MG tablet Take 1 tablet (25 mg total) by mouth at bedtime. 90 tablet 0   montelukast (SINGULAIR) 10 MG tablet Take 1 tablet (10 mg) by mouth at bedtime. 90 tablet 1   potassium chloride (KLOR-CON M) 10 MEQ tablet Take 1 tablet (10 mEq total) by mouth daily as directed 90 tablet 2   PRODIGY LANCETS 28G MISC 1 Units by Does not apply route 4 (four) times daily. 100 each 4   rosuvastatin (CRESTOR) 10 MG tablet Take 1 tablet (10 mg total) by mouth daily. 90 tablet 2   Semaglutide, 2 MG/DOSE, (OZEMPIC, 2 MG/DOSE,) 8 MG/3ML SOPN Inject 2 mg into the skin once a week. 3 mL 2   torsemide (DEMADEX) 20 MG tablet Take 1 tablet (20 mg total) by mouth daily. 90 tablet 1   BOOSTRIX 5-2.5-18.5 LF-MCG/0.5 injection      FLUZONE HIGH-DOSE 0.5 ML injection      gabapentin (NEURONTIN) 100 MG capsule Take 1 capsule (100 mg total) by mouth at bedtime. After 3-4 days, you can increase to 200mg  nightly if needed. After 3-4 days, you can increase again to 300mg  nightly if needed. 90 capsule 0   No facility-administered medications prior to visit.     Allergies:   Avelox [moxifloxacin hcl in nacl], Nsaids, Simvastatin, Ace inhibitors, Jardiance [empagliflozin], and Latex   Family History:  The patient's family history includes Diabetes in her brother and father; Hypertension in her brother and father.     PHYSICAL EXAM:   VS:  BP 114/80 (BP Location: Left Arm, Patient Position: Sitting, Cuff Size: Normal)   Pulse 92   Ht 5\' 6"  (1.676 m)   Wt 183 lb 12.8 oz  (83.4 kg)   LMP 07/26/2011   SpO2 98%   BMI 29.67 kg/m      General: Alert, oriented x3, no distress, overweight, looks younger than stated age. Healthy subclavian ICD site. Head: no evidence of trauma, PERRL, EOMI, no exophtalmos  or lid lag, no myxedema, no xanthelasma; normal ears, nose and oropharynx Neck: normal jugular venous pulsations and no hepatojugular reflux; brisk carotid pulses without delay and no carotid bruits Chest: clear to auscultation, no signs of consolidation by percussion or palpation, normal fremitus, symmetrical and full respiratory excursions Cardiovascular: normal position and quality of the apical impulse, regular rhythm, normal first and second heart sounds, no murmurs, rubs or gallops Abdomen: no tenderness or distention, no masses by palpation, no abnormal pulsatility or arterial bruits, normal bowel sounds, no hepatosplenomegaly Extremities: no clubbing, cyanosis or edema; 2+ radial, ulnar and brachial pulses bilaterally; 2+ right femoral, posterior tibial and dorsalis pedis pulses; 2+ left femoral, posterior tibial and dorsalis pedis pulses; no subclavian or femoral bruits Neurological: grossly nonfocal Psych: Normal mood and affect   Wt Readings from Last 3 Encounters:  07/22/23 185 lb 1.6 oz (84 kg)  07/21/23 183 lb 12.8 oz (83.4 kg)  05/07/23 190 lb (86.2 kg)      Studies/Labs Reviewed:   ECHO 09/19/2018  1. The left ventricle has normal systolic function, with an ejection fraction of 55-60%. The cavity size was normal. There is mild asymmetric left ventricular hypertrophy. Left ventricular diastolic Doppler parameters are consistent with impaired  relaxation. No evidence of left ventricular regional wall motion abnormalities.  2. The average left ventricular global longitudinal strain is 14.9 %.  3. The right ventricle has normal systolic function. The cavity was normal. There is no increase in right ventricular wall thickness.  4. Left atrial  size was mildly dilated.  5. The aortic valve is tricuspid. Mild sclerosis of the aortic valve. Aortic valve regurgitation is trivial by color flow Doppler.    EKG:  Personally reviewed the 05/07/2023 tracing, shows NSR, QTc 476 ms  EKG Interpretation Date/Time:    Ventricular Rate:    PR Interval:    QRS Duration:    QT Interval:    QTC Calculation:   R Axis:      Text Interpretation:           Recent Labs: 05/07/2023: BUN 19; Creatinine, Ser 1.27; Magnesium 2.2; Potassium 3.9; Sodium 136   Lipid Panel    Component Value Date/Time   CHOL 110 08/05/2021 1118   TRIG 57 08/05/2021 1118   HDL 55 08/05/2021 1118   CHOLHDL 2.0 08/05/2021 1118   CHOLHDL 2.4 04/21/2016 1438   VLDL 15 04/21/2016 1438   LDLCALC 42 08/05/2021 1118   LDLDIRECT 94 12/07/2007 2019     ASSESSMENT:    1. Ventricular tachycardia (HCC)   2. Paroxysmal atrial fibrillation (HCC)   3. Chronic diastolic heart failure (HCC)   4. ICD (implantable cardioverter-defibrillator) in place   5. Acquired thrombophilia (HCC)   6. Essential hypertension   7. Overweight   8. OSA on CPAP   9. Encounter for monitoring dofetilide therapy   10. Moderate persistent asthma without complication   11. Type 2 diabetes mellitus with diabetic neuropathy, with long-term current use of insulin (HCC)   12. Stage 3a chronic kidney disease (HCC)        VT: Despite recovered left ventricular ejection fraction she has recurrent sustained VT every couple of years or so.  Continues to also have occasional brief bursts of nonsustained VT.  On dofetilide and beta-blockers. PAF: asymptomatic, low prevalence (<2%), low prevalence of atrial fibrillation at around 1% on dofetilide and bisoprolol.  On appropriate anticoagulation.  CHA2DS2-VASc 5 (CHF, gender, age, hypertension, diabetes mellitus). CHF: NYHA class I, euvolemic at  our estimated dry weight of 180-185 lb.  On maximum tolerated doses of beta-blocker and on SGLT2 inhibitor.   Echo shows recovered EF and NYHA functional class I, BP low, so she is not on Entresto or  aldosterone antagonist. ICD: Normal device function.  Continue remote downloads every 3 months.  Has had at least 3 appropriate interventions for very fast VT. Anticoagulation: No bleeding issues. HTN: BP is borderline low today.  She has a history of cough with ACE inhibitor's.  There is probably no room for Entresto or spironolactone. HLP: On statin. Due a lipid profile update at her next physical in a month. Overweight: Substantial weight loss with Ozempic, about 60 lb, now stabilized OSA: Compliant with CPAP and denies daytime hypersomnolence. Tikosyn: QTc 476 ms. Recent labs OK.  Her ventricular arrhythmia has always been monomorphic, rather than torsades. Asthma: Tolerates bisoprolol well.  Other beta-blockers worsened wheezing in the past. DM: control has deteriorated recently, borderline acceptable A1c. CKD: Stable renal function, GFR 50-60.  Most recent creatinine 1.34.   For some reason her insurance charges more for 10 mg bisoprolol tabs (nonformulary) than it does for 25 mg.  PLAN:  In order of problems listed above:  Medication Adjustments/Labs and Tests Ordered: Current medicines are reviewed at length with the patient today.  Concerns regarding medicines are outlined above.  Medication changes, Labs and Tests ordered today are listed in the Patient Instructions below. Patient Instructions  Medication Instructions:  No changes *If you need a refill on your cardiac medications before your next appointment, please call your pharmacy*  Follow-Up: At [facility], you and your health needs are our priority.  As part of our continuing mission to provide you with exceptional heart care, our providers are all part of one team.  This team includes your primary Cardiologist (physician) and Advanced Practice Providers or APPs (Physician Assistants and Nurse Practitioners) who all work  together to provide you with the care you need, when you need it.  Your next appointment:    Afib Clinic- 6 months- they will call to schedule  Dr Alvis Ba in January 2026  We recommend signing up for the patient portal called "MyChart".  Sign up information is provided on this After Visit Summary.  MyChart is used to connect with patients for Virtual Visits (Telemedicine).  Patients are able to view lab/test results, encounter notes, upcoming appointments, etc.  Non-urgent messages can be sent to your provider as well.   To learn more about what you can do with MyChart, go to [email or web address].        1st Floor: - Lobby - Registration  - Pharmacy  - Lab - Cafe  2nd Floor: - PV Lab - Diagnostic Testing (echo, CT, nuclear med)  3rd Floor: - Vacant  4th Floor: - TCTS (cardiothoracic surgery) - AFib Clinic - Structural Heart Clinic - Vascular Surgery  - Vascular Ultrasound  5th Floor: - HeartCare Cardiology (general and EP) - Clinical Pharmacy for coumadin, hypertension, lipid, weight-loss medications, and med management appointments    Valet parking services will be available as well.        Signed, Luana Rumple, MD  07/25/2023 7:27 PM    [facility] [address] [place], [place], [place]  [address] Phone: [phone]; Fax: [phone]

## 2023-07-22 ENCOUNTER — Other Ambulatory Visit: Payer: Self-pay

## 2023-07-22 ENCOUNTER — Ambulatory Visit (HOSPITAL_BASED_OUTPATIENT_CLINIC_OR_DEPARTMENT_OTHER): Payer: Medicare HMO | Admitting: Pulmonary Disease

## 2023-07-22 ENCOUNTER — Encounter (HOSPITAL_BASED_OUTPATIENT_CLINIC_OR_DEPARTMENT_OTHER): Payer: Self-pay | Admitting: Pulmonary Disease

## 2023-07-22 ENCOUNTER — Ambulatory Visit (HOSPITAL_BASED_OUTPATIENT_CLINIC_OR_DEPARTMENT_OTHER): Admitting: Pulmonary Disease

## 2023-07-22 ENCOUNTER — Other Ambulatory Visit (HOSPITAL_COMMUNITY): Payer: Self-pay

## 2023-07-22 VITALS — BP 120/78 | HR 95 | Ht 66.0 in | Wt 185.1 lb

## 2023-07-22 DIAGNOSIS — G4733 Obstructive sleep apnea (adult) (pediatric): Secondary | ICD-10-CM

## 2023-07-22 MED ORDER — TORSEMIDE 20 MG PO TABS
20.0000 mg | ORAL_TABLET | Freq: Every day | ORAL | 3 refills | Status: AC
  Filled 2023-07-22: qty 90, 90d supply, fill #0
  Filled 2023-10-21: qty 90, 90d supply, fill #1
  Filled 2024-01-17: qty 90, 90d supply, fill #2
  Filled 2024-04-14: qty 90, 90d supply, fill #3

## 2023-07-22 NOTE — Patient Instructions (Signed)
 x change to auto settings 5 to 10 cm  Asked DME to decrease temperature setting on humidity  Appears well-controlled

## 2023-07-22 NOTE — Progress Notes (Signed)
 Subjective:    Patient ID: [name], female    DOB: [birth year], 73 y.o.   MRN: [id number]  HPI  73 yo for FU of for moderate persistent asthma and obstructive sleep apnea CPAP 8 cm   PMH -Atrial fibrillation on Tikosyn   New CPAP 2022  The patient, with a history of persistent asthma and obstructive sleep apnea, presents for a follow-up visit. She reports that she has been doing well on Symbicort and has been adhering to her CPAP therapy. However, she has been experiencing issues with her CPAP machine, including hot water and needing to refill the water in the middle of the night. She also reports a leak in her nasal pillow mask. Despite these issues, she has not adjusted the settings on her machine.  Regarding her asthma, the patient reports no recent flare-ups, even during the current pollen season. She has been taking Symbicort twice daily and Singulair, but no longer uses Flonase or any over-the-counter allergy medications. She has not needed her rescue inhaler frequently.  The patient also mentions that she has been going to the gym frequently and has lost weight since her last visit. She recently saw her cardiologist, who reported that her AF is controlled  CPAP download shows residual AHI of 8/hour on 8 cm.  On some nights residual AHI is as high as 20/hour.  She is very compliant more than 7 hours per night.  She uses nasal pillows and has a moderate leak  AIRQ score 1   Significant tests/ events reviewed  Sleep study 02/2014 : severe apnea, corrected with nasal pillows and 8cmh2o   PFT 06/19/16: FVC 2.74 L (95%) FEV1 2.06 L (92%) FEV1/FVC 0.75  negative bronchodilator response   DLCO corrected 58%   11/04/15: FVC 2.95 L (102%) FEV1 2.10 L (93%) ratio.71  negative bronchodilator response TLC 6.02 L (109%) RV 109% ERV 47% DLCO uncorrected 75% (hgb 12.7)     12/06/08: FVC 3.43 L (99%) FEV1 2.46 L (94%) ratio 72  negative bronchodilator response TLC 6.17 L (112%) RV 131%  ERV 52% DLCO uncorrected 74%  Review of Systems neg for any significant sore throat, dysphagia, itching, sneezing, nasal congestion or excess/ purulent secretions, fever, chills, sweats, unintended wt loss, pleuritic or exertional cp, hempoptysis, orthopnea pnd or change in chronic leg swelling. Also denies presyncope, palpitations, heartburn, abdominal pain, nausea, vomiting, diarrhea or change in bowel or urinary habits, dysuria,hematuria, rash, arthralgias, visual complaints, headache, numbness weakness or ataxia.     Objective:   Physical Exam  Gen. Pleasant, well-nourished, in no distress ENT - no thrush, no pallor/icterus,no post nasal drip Neck: No JVD, no thyromegaly, no carotid bruits Lungs: no use of accessory muscles, no dullness to percussion, clear without rales or rhonchi  Cardiovascular: Rhythm regular, heart sounds  normal, no murmurs or gallops, no peripheral edema Musculoskeletal: No deformities, no cyanosis or clubbing        Assessment & Plan:   Obstructive Sleep Apnea Reports issues with CPAP machine, specifically that the water in the humidifier is too hot and evaporates quickly, requiring refilling during the night. There is a mild leak in the nasal pillows, and events are slightly high. Compliance with CPAP therapy is excellent. Adjusting the pressure settings and temperature of the humidifier is expected to improve comfort and reduce water evaporation. - Adjust CPAP pressure settings 5-10 cm auto - Instruct to contact CPAP provider to adjust humidifier temperature - Renew necessary supplies and prescriptions for a year  Persistent Asthma Asthma is well-controlled with no flare-ups in the past year, even during high pollen seasons. She is on Symbicort twice daily and Singulair. Does not use Flonase or other allergy medications and has not needed her rescue inhaler frequently. - Continue Symbicort twice daily - Continue Singulair - Provide asthma questionnaire  for completion  Atrial Fibrillation Recently visited cardiologist, who reported her heart condition is well-managed. An EKG showed no atrial fibrillation, and her heart rhythm is regular. - Continue tikosyn/apixaban  as prescribed by cardiologist

## 2023-07-25 ENCOUNTER — Encounter: Payer: Self-pay | Admitting: Cardiovascular Disease

## 2023-07-26 ENCOUNTER — Ambulatory Visit: Attending: Cardiovascular Disease

## 2023-07-26 DIAGNOSIS — Z95810 Presence of automatic (implantable) cardiac defibrillator: Secondary | ICD-10-CM | POA: Diagnosis not present

## 2023-07-26 DIAGNOSIS — I5032 Chronic diastolic (congestive) heart failure: Secondary | ICD-10-CM | POA: Diagnosis not present

## 2023-07-29 ENCOUNTER — Other Ambulatory Visit (HOSPITAL_COMMUNITY): Payer: Self-pay

## 2023-07-29 ENCOUNTER — Other Ambulatory Visit: Payer: Self-pay | Admitting: Physician Assistant

## 2023-07-29 ENCOUNTER — Other Ambulatory Visit: Payer: Self-pay | Admitting: Family Medicine

## 2023-07-29 ENCOUNTER — Other Ambulatory Visit: Payer: Self-pay

## 2023-07-29 DIAGNOSIS — E1140 Type 2 diabetes mellitus with diabetic neuropathy, unspecified: Secondary | ICD-10-CM

## 2023-07-29 MED ORDER — DAPAGLIFLOZIN PROPANEDIOL 10 MG PO TABS
10.0000 mg | ORAL_TABLET | Freq: Every day | ORAL | 0 refills | Status: DC
Start: 2023-07-29 — End: 2023-10-21
  Filled 2023-07-29: qty 90, 90d supply, fill #0

## 2023-07-29 MED ORDER — POTASSIUM CHLORIDE CRYS ER 10 MEQ PO TBCR
10.0000 meq | EXTENDED_RELEASE_TABLET | Freq: Every day | ORAL | 3 refills | Status: AC
  Filled 2023-07-29: qty 90, 90d supply, fill #0
  Filled 2023-10-21: qty 90, 90d supply, fill #1
  Filled 2024-01-17: qty 90, 90d supply, fill #2
  Filled 2024-04-14: qty 90, 90d supply, fill #3

## 2023-07-29 MED ORDER — DOFETILIDE 250 MCG PO CAPS
250.0000 ug | ORAL_CAPSULE | Freq: Two times a day (BID) | ORAL | 3 refills | Status: DC
  Filled 2023-07-29: qty 180, 90d supply, fill #0
  Filled 2023-10-21: qty 180, 90d supply, fill #1

## 2023-07-30 ENCOUNTER — Other Ambulatory Visit (HOSPITAL_COMMUNITY): Payer: Self-pay

## 2023-07-30 ENCOUNTER — Other Ambulatory Visit: Payer: Self-pay

## 2023-07-30 NOTE — Progress Notes (Signed)
 EPIC Encounter for ICM Monitoring  Patient Name: [name] is a 73 y.o. female Date: 07/30/2023 Primary Care Physican: Edison Gore, MD Primary Cardiologist: Croitoru Electrophysiologist: Croitoru 02/26/2023 Weight: 182 lbs 05/24/2023 Weight: 184-185 lbs 06/23/2023 Weight: 184 lbs 07/30/2023 Weight: 182-184 lbs                 Clinical Status (21-Jul-2023 to 26-Jul-2023) AT/AF  22 Time in AT/AF  0.1 hr/day (0.4%) Longest AT/AF  27 minutes                                 Spoke with patient and heart failure questions reviewed.  Transmission results reviewed.  Pt asymptomatic for fluid accumulation.  Re ports feeling well at this time and voices no complaints.     Optivol thoracic impedance suggesting normal fluid levels within the last month.     Prescribed dosage:  Torsemide  20 mg 1 tablet by mouth daily.   Potassium 10 mEq 1 tablet by mouth by mouth daily.   Labs: 05/07/2023 Creatinine 1.27, BUN 19, Potassium 3.9, Sodium 136, GFR 45 A complete set of results can be found in Results Review.   Recommendations:   No changes and encouraged to call if experiencing any fluid symptoms.   Follow-up plan: ICM clinic phone appointment on 08/30/2023.   91 day device clinic remote transmission 08/18/2023.     Next office visit scheduled:  04/16/2024 with Dr Alvis Ba.     Copy of ICM check sent to Dr. Alvis Ba.   3 month ICM trend: 07/26/2023.    12-14 Month ICM trend:     Almyra Jain, RN 07/30/2023 12:57 PM

## 2023-08-17 ENCOUNTER — Encounter: Payer: Self-pay | Admitting: Podiatry

## 2023-08-17 ENCOUNTER — Ambulatory Visit: Admitting: Family Medicine

## 2023-08-17 ENCOUNTER — Ambulatory Visit: Payer: Medicare HMO | Admitting: Podiatry

## 2023-08-17 ENCOUNTER — Encounter: Payer: Self-pay | Admitting: Family Medicine

## 2023-08-17 VITALS — BP 129/83 | HR 89 | Ht 66.0 in | Wt 184.1 lb

## 2023-08-17 VITALS — Ht 66.0 in | Wt 185.1 lb

## 2023-08-17 DIAGNOSIS — E1140 Type 2 diabetes mellitus with diabetic neuropathy, unspecified: Secondary | ICD-10-CM

## 2023-08-17 DIAGNOSIS — M65331 Trigger finger, right middle finger: Secondary | ICD-10-CM

## 2023-08-17 DIAGNOSIS — E1142 Type 2 diabetes mellitus with diabetic polyneuropathy: Secondary | ICD-10-CM | POA: Diagnosis not present

## 2023-08-17 DIAGNOSIS — Q828 Other specified congenital malformations of skin: Secondary | ICD-10-CM | POA: Diagnosis not present

## 2023-08-17 DIAGNOSIS — Z794 Long term (current) use of insulin: Secondary | ICD-10-CM | POA: Diagnosis not present

## 2023-08-17 DIAGNOSIS — M79609 Pain in unspecified limb: Secondary | ICD-10-CM

## 2023-08-17 DIAGNOSIS — B351 Tinea unguium: Secondary | ICD-10-CM | POA: Diagnosis not present

## 2023-08-17 LAB — POCT GLYCOSYLATED HEMOGLOBIN (HGB A1C): HbA1c, POC (controlled diabetic range): 8 % — AB (ref 0.0–7.0)

## 2023-08-17 MED ORDER — METHYLPREDNISOLONE ACETATE 80 MG/ML IJ SUSP
80.0000 mg | Freq: Once | INTRAMUSCULAR | Status: AC
  Administered 2023-08-17: 80 mg via INTRAMUSCULAR

## 2023-08-17 MED ORDER — LIDOCAINE HCL (PF) 0.5 % IJ SOLN: 3.0000 mL | Freq: Once | INTRAMUSCULAR | Status: DC

## 2023-08-17 MED ORDER — LIDOCAINE HCL 1 % IJ SOLN: 3.0000 mL | Freq: Once | INTRAMUSCULAR | Status: AC

## 2023-08-17 NOTE — Patient Instructions (Signed)
 Continue your current diabetes medications  Hopefully you will have some improvement after the injection. If you don't notice any change in 2 weeks I would recommend scheduling an appointment with your orthopedist   Keep the area clean and monitor for any bleeding or swelling/redness or infection

## 2023-08-17 NOTE — Progress Notes (Signed)
    SUBJECTIVE:   CHIEF COMPLAINT / HPI:   T2DM -Current medication regimen: Farxiga  10mg  daily, Insulin  degludec 40u daily, ozempic  2mg  weekly -Home CBGs: 90-124. Usually runs 120s -Denies polyuria, polydipsia, abdominal pain, chest pain, shortness of breath -Foot exam: UTD -Eye exam: UTD  Diet: states she does drink ginger ale fairly often which might be contributing. Doesn't have too many sweets or snacks  Lab Results  Component Value Date   HGBA1C 8.0 (A) 08/17/2023   HGBA1C 7.9 (A) 04/08/2023   HGBA1C 7.5 (A) 10/30/2022    Trigger finger of R middle finger Ongoing x 1 mo Not causing significant pain, just locking with movement No injury   PERTINENT  PMH / PSH: Diabetes, PAF, HTN  OBJECTIVE:   Pulse 89   Ht 5\' 6"  (1.676 m)   Wt 184 lb 2 oz (83.5 kg)   LMP 07/26/2011   SpO2 100%   BMI 29.72 kg/m   General: NAD, pleasant, able to participate in exam Respiratory: No respiratory distress Skin: warm and dry, no rashes noted Psych: Normal affect and mood  MSK:  R 2nd finger locking with flexion/extension and releasing consistent w trigger finger Small nodule palpated around A2 pulley with mild tenderness  ASSESSMENT/PLAN:   Assessment & Plan Type 2 diabetes mellitus with diabetic neuropathy, with long-term current use of insulin  (HCC) A1c 8.0 today from 7.9. Home CBGs in appropriate range. At this time will continue current regimen and f/u in 3 months. Pt to attempt some lifestyle changes as well.  Trigger middle finger of right hand Nodule palpated around A2 pulley A steroid injection was performed at R 2nd finger at PIP using 1% plain Lidocaine  and 80 mg of depomedrol. The area was prepped with alcohol swab and betadine  and cold spray was used for numbing. This was well tolerated. Minimal bleeding controlled with gauze and the area was dressed as appropriate.    Edison Gore, MD [facility]

## 2023-08-17 NOTE — Progress Notes (Signed)
 Subjective:  Patient ID: [name], female    DOB: [birth year],  MRN: [id number]  73 y.o. female presents with at risk foot care with history of diabetic neuropathy and painful porokeratotic lesion(s) both feet and painful mycotic toenails that limit ambulation. Painful toenails interfere with ambulation. Aggravating factors include wearing enclosed shoe gear. Pain is relieved with periodic professional debridement. Painful porokeratotic lesions are aggravated when weightbearing with and without shoegear. Pain is relieved with periodic professional debridement. Chief Complaint  Patient presents with   Nail Problem    Pt is here for [facility] last A1C was 7.1 PCP is Dr Baby Bolt and LOV was in September.    PCP: Edison Gore, MD.  New problem(s): None.   Review of Systems: Negative except as noted in the HPI.   Allergies  Allergen Reactions   Avelox [Moxifloxacin Hcl In Nacl] Other (See Comments)    Cardiac arrest per pt   Nsaids Other (See Comments)    Cardiac arrest per pt    Simvastatin Other (See Comments)    Muscle pain   Ace Inhibitors Other (See Comments) and Cough   Jardiance  [Empagliflozin ] Other (See Comments)    Felt "crazy", fatigue, sweating, denies hypoglycemia while taking   Latex Rash    Objective:  There were no vitals filed for this visit. Constitutional Patient is a pleasant 73 y.o. female overweight, in NAD. AAO x 3.  Vascular Capillary fill time to digits <3 seconds.  DP/PT pulse(s) are faintly palpable b/l lower extremities. Pedal hair absent b/l. Lower extremity skin temperature gradient warm to cool b/l. No pain with calf compression b/l. No cyanosis or clubbing noted. No ischemia nor gangrene noted b/l.   Neurologic Protective sensation intact 5/5 intact bilaterally with 10g monofilament b/l. Vibratory sensation intact b/l. No clonus b/l. Pt has subjective symptoms of neuropathy.  Dermatologic Pedal skin is thin, shiny and atrophic b/l.  No open wounds b/l lower  extremities. No interdigital macerations b/l lower extremities. Toenails 1-5 b/l elongated, discolored, dystrophic, thickened, crumbly with subungual debris and tenderness to dorsal palpation. Porokeratotic lesion(s) bilateral great toes, submet head 2 right foot, submet head 3 b/l, and 1st metatarsal head of both feet. No erythema, no edema, no drainage, no fluctuance.  Orthopedic: Normal muscle strength 5/5 to all lower extremity muscle groups bilaterally. Hammertoe(s) 2-5 bilaterally.   Last HgA1c:     Latest Ref Rng & Units 08/17/2023    9:37 AM 04/08/2023   10:57 AM 10/30/2022    8:40 AM  Hemoglobin A1C  Hemoglobin-A1c 0.0 - 7.0 % 8.0  7.9  7.5      Assessment:   1. Pain due to onychomycosis of nail   2. Porokeratosis   3. Diabetic polyneuropathy associated with type 2 diabetes mellitus (HCC)    Plan:  Consent given for treatment. Patient examined. All patient's and/or POA's questions/concerns addressed on today's visit. Toenails 1-5 debrided in length and girth without incident. Porokeratotic lesion(s) left great toe, right great toe, submet head 2 right foot, submet head 3 left foot, submet head 3 right foot, and 1st metatarsal head b/l lower extremities pared and enucleated with sharp debridement without incident. Continue soft, supportive shoe gear daily. Report any pedal injuries to medical professional. Call office if there are any questions/concerns. -Patient/POA to call should there be question/concern in the interim.  Return in about 3 months (around 11/17/2023).  Luella Sager, DPM      [place] LOCATION: [address]  [place], [place] [address]                   Office [phone]   [facility] LOCATION: [address] [place], [place] [address] Office [phone]

## 2023-08-17 NOTE — Assessment & Plan Note (Signed)
 A1c 8.0 today from 7.9. Home CBGs in appropriate range. At this time will continue current regimen and f/u in 3 months. Pt to attempt some lifestyle changes as well.

## 2023-08-18 ENCOUNTER — Ambulatory Visit (INDEPENDENT_AMBULATORY_CARE_PROVIDER_SITE_OTHER)

## 2023-08-18 ENCOUNTER — Ambulatory Visit (INDEPENDENT_AMBULATORY_CARE_PROVIDER_SITE_OTHER): Payer: Medicare HMO

## 2023-08-18 DIAGNOSIS — I472 Ventricular tachycardia, unspecified: Secondary | ICD-10-CM

## 2023-08-18 DIAGNOSIS — M2042 Other hammer toe(s) (acquired), left foot: Secondary | ICD-10-CM | POA: Diagnosis not present

## 2023-08-18 DIAGNOSIS — E1140 Type 2 diabetes mellitus with diabetic neuropathy, unspecified: Secondary | ICD-10-CM

## 2023-08-18 DIAGNOSIS — Z794 Long term (current) use of insulin: Secondary | ICD-10-CM

## 2023-08-18 DIAGNOSIS — M2011 Hallux valgus (acquired), right foot: Secondary | ICD-10-CM | POA: Diagnosis not present

## 2023-08-18 DIAGNOSIS — M2141 Flat foot [pes planus] (acquired), right foot: Secondary | ICD-10-CM | POA: Diagnosis not present

## 2023-08-18 DIAGNOSIS — M2142 Flat foot [pes planus] (acquired), left foot: Secondary | ICD-10-CM | POA: Diagnosis not present

## 2023-08-18 DIAGNOSIS — L84 Corns and callosities: Secondary | ICD-10-CM

## 2023-08-18 DIAGNOSIS — M2041 Other hammer toe(s) (acquired), right foot: Secondary | ICD-10-CM | POA: Diagnosis not present

## 2023-08-18 DIAGNOSIS — M2012 Hallux valgus (acquired), left foot: Secondary | ICD-10-CM | POA: Diagnosis not present

## 2023-08-18 LAB — CUP PACEART REMOTE DEVICE CHECK
Battery Remaining Longevity: 29 mo
Battery Voltage: 2.94 V
Brady Statistic AP VP Percent: 0.5 %
Brady Statistic AP VS Percent: 95.37 %
Brady Statistic AS VP Percent: 0.04 %
Brady Statistic AS VS Percent: 4.09 %
Brady Statistic RA Percent Paced: 93.08 %
Brady Statistic RV Percent Paced: 0.59 %
Date Time Interrogation Session: 20250507084344
HighPow Impedance: 81 Ohm
Implantable Lead Connection Status: 753985
Implantable Lead Connection Status: 753985
Implantable Lead Implant Date: 20120120
Implantable Lead Implant Date: 20120120
Implantable Lead Location: 753859
Implantable Lead Location: 753860
Implantable Lead Model: 5076
Implantable Lead Model: 6935
Implantable Pulse Generator Implant Date: 20190618
Lead Channel Impedance Value: 323 Ohm
Lead Channel Impedance Value: 399 Ohm
Lead Channel Impedance Value: 399 Ohm
Lead Channel Pacing Threshold Amplitude: 0.5 V
Lead Channel Pacing Threshold Amplitude: 0.875 V
Lead Channel Pacing Threshold Pulse Width: 0.4 ms
Lead Channel Pacing Threshold Pulse Width: 0.4 ms
Lead Channel Sensing Intrinsic Amplitude: 1 mV
Lead Channel Sensing Intrinsic Amplitude: 1 mV
Lead Channel Sensing Intrinsic Amplitude: 10 mV
Lead Channel Sensing Intrinsic Amplitude: 10 mV
Lead Channel Setting Pacing Amplitude: 2 V
Lead Channel Setting Pacing Amplitude: 2.5 V
Lead Channel Setting Pacing Pulse Width: 0.4 ms
Lead Channel Setting Sensing Sensitivity: 0.3 mV
Zone Setting Status: 755011
Zone Setting Status: 755011

## 2023-08-19 NOTE — Progress Notes (Signed)

## 2023-08-21 ENCOUNTER — Encounter: Payer: Self-pay | Admitting: Podiatry

## 2023-08-22 ENCOUNTER — Encounter: Payer: Self-pay | Admitting: Cardiovascular Disease

## 2023-08-25 ENCOUNTER — Other Ambulatory Visit: Payer: Self-pay | Admitting: Family Medicine

## 2023-08-25 ENCOUNTER — Other Ambulatory Visit: Payer: Self-pay

## 2023-08-25 ENCOUNTER — Other Ambulatory Visit (HOSPITAL_COMMUNITY): Payer: Self-pay

## 2023-08-25 DIAGNOSIS — E1140 Type 2 diabetes mellitus with diabetic neuropathy, unspecified: Secondary | ICD-10-CM

## 2023-08-25 MED ORDER — OZEMPIC (2 MG/DOSE) 8 MG/3ML ~~LOC~~ SOPN
2.0000 mg | PEN_INJECTOR | SUBCUTANEOUS | 2 refills | Status: DC
  Filled 2023-08-25: qty 3, 28d supply, fill #0
  Filled 2023-09-22: qty 3, 28d supply, fill #1
  Filled 2023-10-18: qty 3, 28d supply, fill #2

## 2023-08-25 MED ORDER — LOSARTAN POTASSIUM 25 MG PO TABS
25.0000 mg | ORAL_TABLET | Freq: Every day | ORAL | 0 refills | Status: DC
  Filled 2023-08-25: qty 90, 90d supply, fill #0

## 2023-08-25 MED ORDER — TRESIBA FLEXTOUCH 100 UNIT/ML ~~LOC~~ SOPN
40.0000 [IU] | PEN_INJECTOR | Freq: Every day | SUBCUTANEOUS | 2 refills | Status: DC
  Filled 2023-08-25: qty 15, 37d supply, fill #0
  Filled 2023-10-04: qty 15, 37d supply, fill #1
  Filled 2023-11-04: qty 15, 37d supply, fill #2

## 2023-08-30 ENCOUNTER — Ambulatory Visit: Attending: Cardiovascular Disease

## 2023-08-30 DIAGNOSIS — Z95810 Presence of automatic (implantable) cardiac defibrillator: Secondary | ICD-10-CM | POA: Diagnosis not present

## 2023-08-30 DIAGNOSIS — I5032 Chronic diastolic (congestive) heart failure: Secondary | ICD-10-CM | POA: Diagnosis not present

## 2023-09-01 NOTE — Progress Notes (Signed)
 EPIC Encounter for ICM Monitoring  Patient Name: [name] is a 73 y.o. female Date: 09/01/2023 Primary Care Physican: Edison Gore, MD Primary Cardiologist: Croitoru Electrophysiologist: Croitoru 02/26/2023 Weight: 182 lbs 05/24/2023 Weight: 184-185 lbs 06/23/2023 Weight: 184 lbs 07/30/2023 Weight: 182-184 lbs                 Since 18-Aug-2023 AT/AF  46 Time in AT/AF  0.1 hr/day (0.4%)                             Spoke with patient and heart failure questions reviewed.  Transmission results reviewed.  Pt asymptomatic for fluid accumulation.  Re ports feeling well at this time and voices no complaints.     Optivol thoracic impedance suggesting normal fluid levels within the last month.     Prescribed dosage:  Torsemide  20 mg 1 tablet by mouth daily.   Potassium 10 mEq 1 tablet by mouth by mouth daily.   Labs: 05/07/2023 Creatinine 1.27, BUN 19, Potassium 3.9, Sodium 136, GFR 45 A complete set of results can be found in Results Review.   Recommendations:   No changes and encouraged to call if experiencing any fluid symptoms.   Follow-up plan: ICM clinic phone appointment on 10/25/2023.   91 day device clinic remote transmission 11/17/2023.     Next office visit scheduled:  Recall 04/16/2024 with Dr Alvis Ba.     Copy of ICM check sent to Dr. Alvis Ba  3 month ICM trend: 08/30/2023.    12-14 Month ICM trend:     Almyra Jain, RN 09/01/2023 4:28 PM

## 2023-09-20 ENCOUNTER — Telehealth: Payer: Self-pay

## 2023-09-20 NOTE — Telephone Encounter (Signed)
 Reviewed w/ EP MD. Appointment made w/ EP APP tomorrow @ 8AM.

## 2023-09-20 NOTE — Telephone Encounter (Signed)
 Alert remote transmission: Number of shocks delivered in an episodes Shock alert was reset on [facility]. Events occurred 6/6 @ 21:47, duration 29sec, HR 250, EGM c/w sustained VT, slowed with 1 burst of ATP Second event 6/6 @ 21:48, duration 21sec, HR 188, EGM c/w sustained VT, 2 bursts of ATP followed by 35J of HV therapy slowing rate, 3 NSVT @ 21:47 and 21:48, 1-6sec in duration per device,  HR's 210-243 Hx of PAF, not always good rate control, burden 2.4%, Eliquis  per EPIC Presenting rhythm AP/VS Route to triage high alert per protocol Follow up as scheduled. LA, CVRS   Two episodes met criteria for VF zone treatment. Pt was laying in bed fatigued. No other symptoms. Says the shock came out of nowhere. Compliant with medications. Advised of driving restrictions x 6 months. ED precautions given. Advised patient I would send to MD and call her back with any changes/recs.   After realizing that the episodes spanned the length of the day I called the patient back and she stated that she did not feel good for days with an overall feeling of unwell and just stayed in bed. She doesn't recount anything resembling any other symptoms.   Of note, Post shock pacing is disabled and 2/6 shock vectors programmed AX>B.

## 2023-09-21 ENCOUNTER — Ambulatory Visit: Attending: Physician Assistant | Admitting: Physician Assistant

## 2023-09-21 VITALS — BP 112/64 | HR 87 | Ht 66.0 in | Wt 183.8 lb

## 2023-09-21 DIAGNOSIS — I480 Paroxysmal atrial fibrillation: Secondary | ICD-10-CM | POA: Diagnosis not present

## 2023-09-21 DIAGNOSIS — I428 Other cardiomyopathies: Secondary | ICD-10-CM | POA: Diagnosis not present

## 2023-09-21 DIAGNOSIS — I472 Ventricular tachycardia, unspecified: Secondary | ICD-10-CM | POA: Diagnosis not present

## 2023-09-21 DIAGNOSIS — Z79899 Other long term (current) drug therapy: Secondary | ICD-10-CM

## 2023-09-21 DIAGNOSIS — Z5181 Encounter for therapeutic drug level monitoring: Secondary | ICD-10-CM | POA: Diagnosis not present

## 2023-09-21 DIAGNOSIS — D6869 Other thrombophilia: Secondary | ICD-10-CM

## 2023-09-21 LAB — CUP PACEART INCLINIC DEVICE CHECK
Battery Remaining Longevity: 27 mo
Battery Voltage: 2.88 V
Brady Statistic AP VP Percent: 0.47 %
Brady Statistic AP VS Percent: 95.36 %
Brady Statistic AS VP Percent: 0.05 %
Brady Statistic AS VS Percent: 4.12 %
Brady Statistic RA Percent Paced: 93.19 %
Brady Statistic RV Percent Paced: 0.57 %
Date Time Interrogation Session: 20250610085532
HighPow Impedance: 80 Ohm
Implantable Lead Connection Status: 753985
Implantable Lead Connection Status: 753985
Implantable Lead Implant Date: 20120120
Implantable Lead Implant Date: 20120120
Implantable Lead Location: 753859
Implantable Lead Location: 753860
Implantable Lead Model: 5076
Implantable Lead Model: 6935
Implantable Pulse Generator Implant Date: 20190618
Lead Channel Impedance Value: 342 Ohm
Lead Channel Impedance Value: 437 Ohm
Lead Channel Impedance Value: 456 Ohm
Lead Channel Pacing Threshold Amplitude: 0.5 V
Lead Channel Pacing Threshold Amplitude: 0.875 V
Lead Channel Pacing Threshold Pulse Width: 0.4 ms
Lead Channel Pacing Threshold Pulse Width: 0.4 ms
Lead Channel Sensing Intrinsic Amplitude: 0.875 mV
Lead Channel Sensing Intrinsic Amplitude: 1.375 mV
Lead Channel Sensing Intrinsic Amplitude: 12.875 mV
Lead Channel Sensing Intrinsic Amplitude: 13.5 mV
Lead Channel Setting Pacing Amplitude: 2 V
Lead Channel Setting Pacing Amplitude: 2.5 V
Lead Channel Setting Pacing Pulse Width: 0.4 ms
Lead Channel Setting Sensing Sensitivity: 0.3 mV
Zone Setting Status: 755011
Zone Setting Status: 755011

## 2023-09-21 LAB — CBC

## 2023-09-21 NOTE — Progress Notes (Signed)
 Cardiology Office Note:  .   Date:  09/21/2023  ID:  [name], DOB May 22, 1950, MRN [id number] PCP: Edison Gore, MD  [facility] Providers Cardiologist:  Luana Rumple, MD {  History of Present Illness: [name]   [name] is a 73 y.o. female w/PMHx of  OSA w/CPAP, HTN, DM NICM, chronic CHF (systolic >> recovered LVEF by echo 2020) (No CAD by cath 2012) AFib ICD VT  She last saw Dr. Alvis Ba 07/21/23, note that she has hx of NSVTs as well as sustained/treated VT described as mostly monomorphic ATP has been successful She was doing well, going to the gym regularly Reported stable QTc  Today's visit is scheduled as a work in 2/2 ICD shock ROS:   She is accompanied by her daughter She reports of late feeling really well Going to the gym with good exertional capacity No CP, SOB, DOE She does know when she is in AFib, makes her very tired/no energy.  She had generally felt unwell/fatigued, knew she was having AFib that day, but nothing unusual for her She was in bed for the evening but awake when she got shocked, she had no pre-shock symptoms, not lightheaded, no near syncope or syncope, felt the shock  She reports excellent medication complaince    Device information MDT dual chamber ICD implanted 05/02/2010, gen change 09/28/2017  + appropriate therapy (VT) 2016, 2022,April 2024 > June 2025 Despite recovered LVEF  Arrhythmia/AAD hx VT > no AAD AFib/AFlutter also described historically Dr. Tita Form reports: recurrent paroxysmal atrial fibrillation status post 2 ablation procedures (Dr. Teofilo Fellers), with history of ventricular fibrillation arrest while on treatment with diltiazem and dofetilide  and a prolonged QT interval, probably related to simultaneous quinolone therapy  ON TIKOSYN  currently (seems to go back to 2009, ? Off and back on perhaps 2012)  Studies Reviewed: [name]    EKG done today and reviewed by myself:  APaced, V sensed, PVC QTC No acute  changes   DEVICE interrogation done today and reviewed by myself Battery and lead measurements are good AF burden 2.7%  Treated VT 6/6, none since #1: NSVT > MMVT> ATP > changed morphology > self terminated #2: MMVT, in/out initially > ATP > NSVTs > VT > ATP > changed to another MMVT different morphology > HV tx w/dirty break AX. B changed to B>AX where needed No other programming changes made   ECHO 09/19/2018  1. The left ventricle has normal systolic function, with an ejection fraction of 55-60%. The cavity size was normal. There is mild asymmetric left ventricular hypertrophy. Left ventricular diastolic Doppler parameters are consistent with impaired  relaxation. No evidence of left ventricular regional wall motion abnormalities.  2. The average left ventricular global longitudinal strain is 14.9 %.  3. The right ventricle has normal systolic function. The cavity was normal. There is no increase in right ventricular wall thickness.  4. Left atrial size was mildly dilated.  5. The aortic valve is tricuspid. Mild sclerosis of the aortic valve. Aortic valve regurgitation is trivial by color flow Doppler.   Risk Assessment/Calculations:    Physical Exam:   VS:  LMP 07/26/2011    Wt Readings from Last 3 Encounters:  08/17/23 184 lb 2 oz (83.5 kg)  08/17/23 185 lb 1.6 oz (84 kg)  07/22/23 185 lb 1.6 oz (84 kg)    GEN: Well nourished, well developed in no acute distress NECK: No JVD; No carotid bruits CARDIAC: RRR, no murmurs, rubs, gallops RESPIRATORY:  CTA b/l without rales, wheezing or rhonchi  ABDOMEN: Soft, non-tender, non-distended EXTREMITIES: No edema; No deformity   ICD site: is stable, no thinning, fluctuation, tethering  ASSESSMENT AND PLAN: .    VT Known for her MMVT No symptoms to suggest clinic change Given not new for her, no symptoms of angina Will get labs/lytes, update her echo  Discussed no driving 6 mo Will reach out to Dr. Tita Form who knows her very well  for any other recommendations he may have   Paroxysmal AFib CHA2DS2Vasc is 3, on Eliquis , appropriately dosed Chronic Tikosyn  w/stable QTc 2.7 % burden  NICM Recvered lLVEF by her echo 2020 Given VT will update Volume status is good  Secondary hypercoagulable state 2/2 AFib       Dispo: back in 2-14mo, sooner if needed  Signed, Debbie Fails, PA-C

## 2023-09-21 NOTE — Patient Instructions (Addendum)
 Medication Instructions:   Your physician recommends that you continue on your current medications as directed. Please refer to the Current Medication list given to you today.   *If you need a refill on your cardiac medications before your next appointment, please call your pharmacy*   Lab Work:  PLEASE GO DOWN STAIRS  [facility]  FIRST FLOOR   ( GET OFF ELEVATORS WALK TOWARDS WAITING AREA LAB LOCATED BY PHARMACY):  BMET MAG AND CBC TODAY     If you have labs (blood work) drawn today and your tests are completely normal, you will receive your results only by: MyChart Message (if you have MyChart) OR A paper copy in the mail If you have any lab test that is abnormal or we need to change your treatment, we will call you to review the results.   Testing/Procedures: Your physician has requested that you have an echocardiogram. Echocardiography is a painless test that uses sound waves to create images of your heart. It provides your doctor with information about the size and shape of your heart and how well your heart's chambers and valves are working. This procedure takes approximately one hour. There are no restrictions for this procedure. Please do NOT wear cologne, perfume, aftershave, or lotions (deodorant is allowed). Please arrive 15 minutes prior to your appointment time.  Please note: We ask at that you not bring children with you during ultrasound (echo/ vascular) testing. Due to room size and safety concerns, children are not allowed in the ultrasound rooms during exams. Our front office staff cannot provide observation of children in our lobby area while testing is being conducted. An adult accompanying a patient to their appointment will only be allowed in the ultrasound room at the discretion of the ultrasound technician under special circumstances. We apologize for any inconvenience.    Follow-Up: At [facility], you and your health needs are our priority.  As part of our  continuing mission to provide you with exceptional heart care, our providers are all part of one team.  This team includes your primary Cardiologist (physician) and Advanced Practice Providers or APPs (Physician Assistants and Nurse Practitioners) who all work together to provide you with the care you need, when you need it.  Your next appointment:    3 month(s)    Provider:    Luana Rumple, MD ONLY!!    We recommend signing up for the patient portal called "MyChart".  Sign up information is provided on this After Visit Summary.  MyChart is used to connect with patients for Virtual Visits (Telemedicine).  Patients are able to view lab/test results, encounter notes, upcoming appointments, etc.  Non-urgent messages can be sent to your provider as well.   To learn more about what you can do with MyChart, go to [email or web address].   Other Instructions

## 2023-09-22 ENCOUNTER — Other Ambulatory Visit (HOSPITAL_BASED_OUTPATIENT_CLINIC_OR_DEPARTMENT_OTHER): Payer: Self-pay | Admitting: Pulmonary Disease

## 2023-09-22 ENCOUNTER — Other Ambulatory Visit (HOSPITAL_COMMUNITY): Payer: Self-pay

## 2023-09-22 LAB — CBC
Hematocrit: 42.3 % (ref 34.0–46.6)
Hemoglobin: 13.7 g/dL (ref 11.1–15.9)
MCH: 29.3 pg (ref 26.6–33.0)
MCHC: 32.4 g/dL (ref 31.5–35.7)
MCV: 91 fL (ref 79–97)
Platelets: 239 10*3/uL (ref 150–450)
RBC: 4.67 x10E6/uL (ref 3.77–5.28)
RDW: 13 % (ref 11.7–15.4)
WBC: 4.4 10*3/uL (ref 3.4–10.8)

## 2023-09-22 LAB — BASIC METABOLIC PANEL WITH GFR
BUN/Creatinine Ratio: 16 (ref 12–28)
BUN: 23 mg/dL (ref 8–27)
CO2: 22 mmol/L (ref 20–29)
Calcium: 9.4 mg/dL (ref 8.7–10.3)
Chloride: 100 mmol/L (ref 96–106)
Creatinine, Ser: 1.41 mg/dL — ABNORMAL HIGH (ref 0.57–1.00)
Glucose: 127 mg/dL — ABNORMAL HIGH (ref 70–99)
Potassium: 5.1 mmol/L (ref 3.5–5.2)
Sodium: 138 mmol/L (ref 134–144)
eGFR: 40 mL/min/{1.73_m2} — ABNORMAL LOW (ref >59)

## 2023-09-22 LAB — MAGNESIUM: Magnesium: 2.3 mg/dL (ref 1.6–2.3)

## 2023-09-24 DIAGNOSIS — E785 Hyperlipidemia, unspecified: Secondary | ICD-10-CM | POA: Diagnosis not present

## 2023-09-24 DIAGNOSIS — I130 Hypertensive heart and chronic kidney disease with heart failure and stage 1 through stage 4 chronic kidney disease, or unspecified chronic kidney disease: Secondary | ICD-10-CM | POA: Diagnosis not present

## 2023-09-24 DIAGNOSIS — E1122 Type 2 diabetes mellitus with diabetic chronic kidney disease: Secondary | ICD-10-CM | POA: Diagnosis not present

## 2023-09-24 DIAGNOSIS — I472 Ventricular tachycardia, unspecified: Secondary | ICD-10-CM | POA: Diagnosis not present

## 2023-09-24 DIAGNOSIS — N1832 Chronic kidney disease, stage 3b: Secondary | ICD-10-CM | POA: Diagnosis not present

## 2023-09-24 DIAGNOSIS — J4489 Other specified chronic obstructive pulmonary disease: Secondary | ICD-10-CM | POA: Diagnosis not present

## 2023-09-24 DIAGNOSIS — I4891 Unspecified atrial fibrillation: Secondary | ICD-10-CM | POA: Diagnosis not present

## 2023-09-24 DIAGNOSIS — I471 Supraventricular tachycardia, unspecified: Secondary | ICD-10-CM | POA: Diagnosis not present

## 2023-09-24 DIAGNOSIS — I509 Heart failure, unspecified: Secondary | ICD-10-CM | POA: Diagnosis not present

## 2023-09-24 DIAGNOSIS — Z794 Long term (current) use of insulin: Secondary | ICD-10-CM | POA: Diagnosis not present

## 2023-09-24 DIAGNOSIS — E1151 Type 2 diabetes mellitus with diabetic peripheral angiopathy without gangrene: Secondary | ICD-10-CM | POA: Diagnosis not present

## 2023-09-24 DIAGNOSIS — I429 Cardiomyopathy, unspecified: Secondary | ICD-10-CM | POA: Diagnosis not present

## 2023-09-26 ENCOUNTER — Ambulatory Visit: Payer: Self-pay | Admitting: Cardiology

## 2023-09-27 ENCOUNTER — Ambulatory Visit: Payer: Self-pay | Admitting: Family Medicine

## 2023-09-27 ENCOUNTER — Other Ambulatory Visit (HOSPITAL_COMMUNITY): Payer: Self-pay

## 2023-09-27 ENCOUNTER — Other Ambulatory Visit (HOSPITAL_BASED_OUTPATIENT_CLINIC_OR_DEPARTMENT_OTHER): Payer: Self-pay

## 2023-09-27 MED ORDER — MONTELUKAST SODIUM 10 MG PO TABS
10.0000 mg | ORAL_TABLET | Freq: Every day | ORAL | 1 refills | Status: DC
  Filled 2023-09-27: qty 90, 90d supply, fill #0
  Filled 2023-12-27: qty 90, 90d supply, fill #1

## 2023-09-27 NOTE — Telephone Encounter (Signed)
 Rx sent to pharmacy and pt notified on VM

## 2023-09-27 NOTE — Telephone Encounter (Signed)
 This RN contacted [facility] and was told to send a CRM to clinical staff in regards to this.    Copied from CRM [id number]. Topic: Clinical - Prescription Issue >> Sep 27, 2023  3:15 PM [name] wrote: Reason for CRM: patient called pharmacy for prescription for montelukast  (SINGULAIR ), was told it was denied because patient need to have appointment with dr but patient last appointment was 07/2023, please review.

## 2023-09-28 ENCOUNTER — Other Ambulatory Visit: Payer: Self-pay

## 2023-09-29 NOTE — Progress Notes (Signed)
 Remote ICD transmission.

## 2023-10-18 ENCOUNTER — Other Ambulatory Visit (HOSPITAL_COMMUNITY): Payer: Self-pay

## 2023-10-18 ENCOUNTER — Other Ambulatory Visit: Payer: Self-pay | Admitting: Family Medicine

## 2023-10-18 DIAGNOSIS — J45909 Unspecified asthma, uncomplicated: Secondary | ICD-10-CM | POA: Diagnosis not present

## 2023-10-18 DIAGNOSIS — J449 Chronic obstructive pulmonary disease, unspecified: Secondary | ICD-10-CM | POA: Diagnosis not present

## 2023-10-18 DIAGNOSIS — G4733 Obstructive sleep apnea (adult) (pediatric): Secondary | ICD-10-CM | POA: Diagnosis not present

## 2023-10-18 MED ORDER — BD PEN NEEDLE NANO U/F 32G X 4 MM MISC
2 refills | Status: DC
  Filled 2023-10-18: qty 100, 100d supply, fill #0

## 2023-10-21 ENCOUNTER — Other Ambulatory Visit (HOSPITAL_COMMUNITY): Payer: Self-pay

## 2023-10-21 ENCOUNTER — Other Ambulatory Visit: Payer: Self-pay | Admitting: Family Medicine

## 2023-10-21 DIAGNOSIS — E1140 Type 2 diabetes mellitus with diabetic neuropathy, unspecified: Secondary | ICD-10-CM

## 2023-10-21 MED ORDER — DAPAGLIFLOZIN PROPANEDIOL 10 MG PO TABS
10.0000 mg | ORAL_TABLET | Freq: Every day | ORAL | 3 refills | Status: AC
  Filled 2023-10-21: qty 90, 90d supply, fill #0
  Filled 2024-01-17: qty 90, 90d supply, fill #1
  Filled 2024-04-14: qty 90, 90d supply, fill #2

## 2023-10-22 ENCOUNTER — Other Ambulatory Visit: Payer: Self-pay

## 2023-10-25 ENCOUNTER — Ambulatory Visit: Attending: Cardiovascular Disease

## 2023-10-25 ENCOUNTER — Telehealth: Payer: Self-pay

## 2023-10-25 DIAGNOSIS — Z95810 Presence of automatic (implantable) cardiac defibrillator: Secondary | ICD-10-CM

## 2023-10-25 DIAGNOSIS — I5032 Chronic diastolic (congestive) heart failure: Secondary | ICD-10-CM

## 2023-10-25 DIAGNOSIS — I472 Ventricular tachycardia, unspecified: Secondary | ICD-10-CM

## 2023-10-25 NOTE — Telephone Encounter (Addendum)
 Patient experienced 17 NSVT episodes between 806pm -809pm on 10/01/23.  Appear all apart of the same event with 7 rounds of ATP with multiple dirty breaks.  Final 2 bursts of ATP on 6/20 at 8:09pm were effective in breaking the rhythm with return to sinus rhythm.  No recorded VT since 6/20 episodes.   Also periodic AF events with uncontrolled v rates during AF.  (Longest AF event was 2 hours in duration, overall burden 2.4%).   Meds include: Dofetilide , Bisoprolol ,Eliquis   LM for patient to call back and assess symptoms, driving restrictions and forward to Dr. Francyne with update.

## 2023-10-25 NOTE — Telephone Encounter (Signed)
 Patient states that on 6/20 event - she remembers feeling some chest pressure and discomfort down in her arm.  It resolved quickly but she does remember this.  Overall states she has been noticing feelings of SOB at times on exertion and increased fatigue.  No missed doses of any of her medications.   Forwarding to Dr. Francyne for review and then will follow up with patient.

## 2023-10-25 NOTE — Telephone Encounter (Signed)
 Can we please bring in to have labs checked (c-Met, magnesium , BNP) and add her onto the end of my schedule 11/01/2023 at 10:20 AM?

## 2023-10-25 NOTE — Telephone Encounter (Signed)
 Returned call to Pt.    Advised per Dr. Hercules like Pt to get lab work (CMET, BNP and mag) and follow up with him next Monday 11/01/2023 at 10:20 am.  Pt will get lab work this week.  Orders entered.  Pt scheduled for follow up.    Pt indicates understanding of above.

## 2023-10-26 ENCOUNTER — Other Ambulatory Visit: Payer: Self-pay | Admitting: Family Medicine

## 2023-10-26 DIAGNOSIS — Z1231 Encounter for screening mammogram for malignant neoplasm of breast: Secondary | ICD-10-CM

## 2023-10-26 DIAGNOSIS — I472 Ventricular tachycardia, unspecified: Secondary | ICD-10-CM | POA: Diagnosis not present

## 2023-10-27 ENCOUNTER — Ambulatory Visit: Payer: Self-pay | Admitting: Emergency Medicine

## 2023-10-27 ENCOUNTER — Telehealth: Payer: Self-pay | Admitting: Emergency Medicine

## 2023-10-27 LAB — MAGNESIUM: Magnesium: 2.3 mg/dL (ref 1.6–2.3)

## 2023-10-27 LAB — COMPREHENSIVE METABOLIC PANEL WITH GFR
ALT: 63 IU/L — ABNORMAL HIGH (ref 0–32)
AST: 42 IU/L — ABNORMAL HIGH (ref 0–40)
Albumin: 3.9 g/dL (ref 3.8–4.8)
Alkaline Phosphatase: 105 IU/L (ref 44–121)
BUN/Creatinine Ratio: 13 (ref 12–28)
BUN: 19 mg/dL (ref 8–27)
Bilirubin Total: 0.5 mg/dL (ref 0.0–1.2)
CO2: 22 mmol/L (ref 20–29)
Calcium: 9.2 mg/dL (ref 8.7–10.3)
Chloride: 99 mmol/L (ref 96–106)
Creatinine, Ser: 1.44 mg/dL — ABNORMAL HIGH (ref 0.57–1.00)
Globulin, Total: 3.4 g/dL (ref 1.5–4.5)
Glucose: 176 mg/dL — ABNORMAL HIGH (ref 70–99)
Potassium: 4.4 mmol/L (ref 3.5–5.2)
Sodium: 140 mmol/L (ref 134–144)
Total Protein: 7.3 g/dL (ref 6.0–8.5)
eGFR: 39 mL/min/1.73 — ABNORMAL LOW (ref >59)

## 2023-10-27 LAB — PRO B NATRIURETIC PEPTIDE: NT-Pro BNP: 327 pg/mL — ABNORMAL HIGH (ref 0–301)

## 2023-10-27 NOTE — Telephone Encounter (Signed)
 Croitoru, Mihai, MD to Me (Selected Message)     10/27/23  4:57 PM Kind of weird that BNP is back, but the BMET and Mg are not. Usually the other way around. Could you please check on these labs? Claudene Delon LABOR, RN    10/25/23 12:55 PM Note Returned call to Pt.     Advised per Dr. Hercules like Pt to get lab work (CMET, BNP and mag) and follow up with him next Monday 11/01/2023 at 10:20 am.   Pt will get lab work this week.  Orders entered.   Pt scheduled for follow up.     Pt indicates understanding of above.        Spoke with [employer]- said that the Mag and the CMP are in progress she will send an email to the lab to follow up; she is hoping they result over night. She will follow up with the lab tomorrow to make sure that the labs were drawn and sent. She will fax over results to our same day fax when they have resulted.

## 2023-10-29 NOTE — Progress Notes (Signed)
 EPIC Encounter for ICM Monitoring  Patient Name: [name] is a 73 y.o. female Date: 10/29/2023 Primary Care Physican: Romelle Booty, MD Primary Cardiologist: Croitoru Electrophysiologist: Croitoru 02/26/2023 Weight: 182 lbs 05/24/2023 Weight: 184-185 lbs 06/23/2023 Weight: 184 lbs 07/30/2023 Weight: 182-184 lbs 10/29/2023 Weight: 181 lbs                 Clinical Status (21-Sep-2023 to 25-Oct-2023) AT/AF  81 Time in AT/AF  0.6 hr/day (2.4%)      Longest AT/AF   2 hours           Pace terminated VT/VF episodes     5 of 5 on 6/20 and addressed by device clinic RN           Spoke with patient and heart failure questions reviewed.  Transmission results reviewed.  Pt asymptomatic for fluid accumulation.  She had ATP following in June and will follow up with Dr Francyne,   Optivol thoracic impedance suggesting normal fluid levels within the last month.     Prescribed dosage:  Torsemide  20 mg 1 tablet by mouth daily.   Potassium 10 mEq 1 tablet by mouth by mouth daily.   Labs: 10/26/2023 Creatinine 1.44, BUN 19, Potassium 4.4, Sodium 140, GFR 39, BNP 327 05/07/2023 Creatinine 1.27, BUN 19, Potassium 3.9, Sodium 136, GFR 45 A complete set of results can be found in Results Review.   Recommendations:   No changes and encouraged to call if experiencing any fluid symptoms.   Follow-up plan: ICM clinic phone appointment on 11/29/2023.   91 day device clinic remote transmission 11/17/2023.     Next office visit scheduled:   11/01/2023 with Dr Francyne.     Copy of ICM check sent to Dr. Francyne.  3 month ICM trend: 10/25/2023.    12-14 Month ICM trend:     [name], RN 10/29/2023 2:57 PM

## 2023-11-01 ENCOUNTER — Encounter: Payer: Self-pay | Admitting: Cardiovascular Disease

## 2023-11-01 ENCOUNTER — Ambulatory Visit: Attending: Cardiovascular Disease | Admitting: Cardiovascular Disease

## 2023-11-01 VITALS — BP 110/63 | HR 90 | Ht 66.0 in | Wt 183.0 lb

## 2023-11-01 DIAGNOSIS — Z794 Long term (current) use of insulin: Secondary | ICD-10-CM

## 2023-11-01 DIAGNOSIS — I5032 Chronic diastolic (congestive) heart failure: Secondary | ICD-10-CM

## 2023-11-01 DIAGNOSIS — I472 Ventricular tachycardia, unspecified: Secondary | ICD-10-CM

## 2023-11-01 DIAGNOSIS — D6869 Other thrombophilia: Secondary | ICD-10-CM | POA: Diagnosis not present

## 2023-11-01 DIAGNOSIS — I480 Paroxysmal atrial fibrillation: Secondary | ICD-10-CM | POA: Diagnosis not present

## 2023-11-01 DIAGNOSIS — Z5181 Encounter for therapeutic drug level monitoring: Secondary | ICD-10-CM | POA: Diagnosis not present

## 2023-11-01 DIAGNOSIS — E1140 Type 2 diabetes mellitus with diabetic neuropathy, unspecified: Secondary | ICD-10-CM

## 2023-11-01 DIAGNOSIS — G4733 Obstructive sleep apnea (adult) (pediatric): Secondary | ICD-10-CM | POA: Diagnosis not present

## 2023-11-01 DIAGNOSIS — Z95810 Presence of automatic (implantable) cardiac defibrillator: Secondary | ICD-10-CM

## 2023-11-01 DIAGNOSIS — I10 Essential (primary) hypertension: Secondary | ICD-10-CM | POA: Diagnosis not present

## 2023-11-01 DIAGNOSIS — Z79899 Other long term (current) drug therapy: Secondary | ICD-10-CM

## 2023-11-01 DIAGNOSIS — E7800 Pure hypercholesterolemia, unspecified: Secondary | ICD-10-CM | POA: Diagnosis not present

## 2023-11-01 DIAGNOSIS — R072 Precordial pain: Secondary | ICD-10-CM

## 2023-11-01 DIAGNOSIS — N1831 Chronic kidney disease, stage 3a: Secondary | ICD-10-CM | POA: Diagnosis not present

## 2023-11-01 NOTE — Patient Instructions (Addendum)
 Medication Instructions:  Your physician recommends that you continue on your current medications as directed. Please refer to the Current Medication list given to you today.  *If you need a refill on your cardiac medications before your next appointment, please call your pharmacy*   Testing/Procedures:   Your cardiac CT will be scheduled at one of the below locations:   [facility] [address] [place], [place] [address] [phone]  OR   [facility] [address]  [place], [place] [address]  If scheduled at [facility], please arrive at the [facility] and Children's Entrance (Entrance C2) of [facility] 30 minutes prior to test start time. You can use the FREE valet parking offered at entrance C (encouraged to control the heart rate for the test)  Proceed to the [facility] Radiology Department (first floor) to check-in and test prep.  All radiology patients and guests should use entrance C2 at [facility], accessed from [facility], even though the hospital's physical address listed is [address]  If scheduled at the Heart and Vascular Tower at [address], please enter the parking lot using the [address] entrance and use the FREE valet service at the patient drop-off area. Enter the buidling and check-in with registration on the main floor.  Please follow these instructions carefully (unless otherwise directed):  An IV will be required for this test and Nitroglycerin will be given.   On the Night Before the Test: Be sure to Drink plenty of water . Do not consume any caffeinated/decaffeinated beverages or chocolate 12 hours prior to your test. Do not take any antihistamines 12 hours prior to your test.  On the Day of the Test: Drink plenty of water  until 1 hour prior to the test. Do not eat any food 1 hour prior to test. You may take your regular medications prior to the  test.  Take Bisoprolol  (Zebeta ) 20 mg (2 tablets) two hours prior to test. Please HOLD Losartan  and Torsemide  on the morning of the test. Patients who wear a continuous glucose monitor MUST remove the device prior to scanning. FEMALES- please wear underwire-free bra if available, avoid dresses & tight clothing  After the Test: Drink plenty of water . After receiving IV contrast, you may experience a mild flushed feeling. This is normal. On occasion, you may experience a mild rash up to 24 hours after the test. This is not dangerous. If this occurs, you can take Benadryl  25 mg, Zyrtec, Claritin , or Allegra and increase your fluid intake. (Patients taking Tikosyn  should avoid Benadryl , and may take Zyrtec, Claritin , or Allegra) If you experience trouble breathing, this can be serious. If it is severe call 911 IMMEDIATELY. If it is mild, please call our office.  We will call to schedule your test 2-4 weeks out understanding that some insurance companies will need an authorization prior to the service being performed.   For more information and frequently asked questions, please visit our website : [email or web address]  For non-scheduling related questions, please contact the cardiac imaging nurse navigator should you have any questions/concerns: Cardiac Imaging Nurse Navigators Direct Office Dial: [phone]   For scheduling needs, including cancellations and rescheduling, please call [place], [phone].   Follow-Up: At [facility], you and your health needs are our priority.  As part of our continuing mission to provide you with exceptional heart care, our providers are all part of one team.  This team includes your  primary Cardiologist (physician) and Advanced Practice Providers or APPs (Physician Assistants and Nurse Practitioners) who all work together to provide you with the care you need, when you need it.  Your next appointment:    September 8th at  10:40am  Provider:   Jerel Balding, MD    Other instructions: Please madk an appt with an EP provider (not APP). First available.

## 2023-11-01 NOTE — Progress Notes (Signed)
 Patient ID: MARIELOUISE AMEY, female   DOB: 1951/03/30, 73 y.o.   MRN: [id number] Patient ID: ABIGAYL HOR, female   DOB: [birth year], 73 y.o.   MRN: [id number]    Cardiology Office Note    Date:  11/01/2023   ID:  BRIAUNNA GRINDSTAFF, DOB 11/26/1950, MRN [id number]  PCP:  Romelle Booty, MD  Cardiologist:   Jerel Balding, MD   No chief complaint on file.   History of Present Illness:  [name] is a 73 y.o. female with a long-standing history of nonischemic cardiomyopathy, chronic heart failure with recovered LV systolic function and paroxysmal atrial fibrillation, s/p ICD (Medtronic, gen change 2019), no evidence of coronary artery disease by cardiac catheterization in 2012.  At the time of her original presentation in 2012, LVEF was decreased at 15-20% but she had complete recovery of LV function with EF 55-60% by echo in June 2020  There has recently been an increase in the episodes of sustained ventricular arrhythmia.  She had appropriate previous treatment for rapid monomorphic ventricular tachycardia (July 27, 2022) successfully terminated with ATP during charge.  In early June 2025 she had an episode of sustained monomorphic VT that did not resolve with ATP and she received a single high-voltage shock with resolution.  She did not experience syncope.  Labs were normal, echo was scheduled (not yet performed), no medication changes.  She had another episode of VT requiring therapy on 10/01/2023.  Multiple episodes of nonsustained and sustained VT occurred this time terminating with antitachycardia pacing (7 rounds of ATP with multiple dirty breaks.  Once again it appears to be a monomorphic arrhythmia.  During this episode she had discomfort in her left shoulder radiating down her left arm that resolved when the arrhythmia resolved.  Once again electrolytes are normal, creatinine is stable at 1.4.  N-terminal proBNP was minimally elevated at 327 and OptiVol was in stable normal range.  Her ICD also  shows an unchanged low prevalence of paroxysmal atrial fibrillation with mostly controlled ventricular rates, occasional RVR, and without any clear association with the ventricular arrhythmia.  Otherwise normal ICD function per download performed 10/25/2023.  The overall burden of atrial fibrillation is 2.4%, similar to historical trend.  As before she has roughly 96% atrial pacing and only 0.4% ventricular pacing.  Otherwise she has been feeling well and exercising regularly at the gym without any complaints of chest or arm discomfort.  She has not had any shortness of breath at rest or with activity, orthopnea, PND, claudication, leg edema, weight changes, focal neurological events.   She is on chronic treatment with dofetilide .  QTc is 463 ms today.  None of the episodes of treated VT appeared to be polymorphic.  Normal ICD function, presenting rhythm is AsVp (96%Ap with good histogram, 0.8% Vp). Afib burden is stable at 1.9%, occasionally w RVR, rare brief NSVT. Excellent lead parameters. Generator longevity about 2.5 years.  Reports compliance with CPAP.  Denies hypersomnolence during the daytime.   Metabolic control is not as good (A1c up to 7.9%) on metformin, GLP-1 agonist and SGLT2 inhibitor. Need to update her lipid profile, but when last checked, all values were great.  She had antitachycardia pacing for ventricular tachycardia in December 2016 and again in April 2022 and April 2024.  Otherwise she occasional brief bursts of nonsustained VT.. On July 19, 2020 and July 27, 2022 she had very fast VT (CL 280 ms), mostly monomorphic.  Each time the arrhythmia was terminated with  ATP.  She has a history of moderate nonischemic cardiomyopathy (no coronary disease by cardiac catheterization January 2012), recurrent paroxysmal atrial fibrillation status post 2 ablation procedures (Dr. Charlott), with history of ventricular fibrillation arrest while on treatment with diltiazem and dofetilide  and a  prolonged QT interval, probably related to simultaneous quinolone therapy. She has a dual-chamber Medtronic Protecta defibrillator implanted in January 2012. In December 2016 her defibrillator recorded an episode of sustained monomorphic ventricular tachycardia. Antitachycardia pacing was unsuccessful at converting the arrhythmia, but she had a dirty break before defibrillator shock was delivered. The shock was aborted. Polymorphic VT lasting 2 seconds was recorded in the fall of 2017 during asthma exacerbation, but did not require intervention. Had sustained monomorphic VT on July 19, 2020. ATP successfully terminated the arrhythmia.  Most recent echo June 2020 showed normal LVEF 55-60%, but reduced GLS -14%. At LV angiography in 2012, EF was 15-20%.  Past Medical History:  Diagnosis Date   AICD (automatic cardioverter/defibrillator) present    Anemia    Anxiety    Arthritis    Arthritis of carpometacarpal (CMC) joint of thumb 03/26/2016   Asthma    has had multiple hospitalizations for this   Asthma, moderate persistent 12/10/2011   Atrial fibrillation (HCC)    ablation x 2 [facility], 01/2006, 2011.  on warfarin   Cardiac arrest (HCC) 04/2010   in hospital for pneumonia when this occured- occured at the hospital   CHF (congestive heart failure) (HCC) 2012   Echo 08/08/10 by [facility]. EF 35-45%. LV systolic function moderately reduced. Moderate global hypokinesis of LV.  RV systolic function moderately reduced. Mild MR. Trace AR.   Chronic diastolic heart failure (HCC) 01/07/2018   CKD (chronic kidney disease) stage 2, GFR 60-89 ml/min 02/22/2012   Her cr range 1.2-1.5 since Jan 2012 after hospitalization. 10/13 Estimated Creatinine Clearance: 69.3 ml/min (by C-G formula based on Cr of 1).     Complication of anesthesia    difficult time waking up after anesthesia   Depression    Diabetes mellitus    Type 2   Diabetic neuropathy (HCC) 09/18/2011   GASTROESOPHAGEAL REFLUX, NO  ESOPHAGITIS 06/10/2006   Qualifier: Diagnosis of  By: Sharron Railing     GERD (gastroesophageal reflux disease) 03/23/2018   History of hiatal hernia    History of kidney stones    HYPERCHOLESTEROLEMIA 06/10/2006   LDL 93 at 01/2011 check-  Continue pravastatin  20mg  po daily.  --discuss health modifications and possibly increaseing dose at next appt.  Cardiology- Dr little- stopped pravachol  on 05/21/11- 2/2 allergies?     Hyperlipidemia    Hypertension    ICD (St. Jude Protecta dual-chamber),secondary prevention (VF arrest) January 2012 11/19/2012   Iliotibial band syndrome, left 11/01/2018   Limb pain 06/04/2008   LLE, Baker's cyst in popliteal fossa, no DVT   Long term current use of anticoagulant therapy 08/21/2015   Non-ischemic cardiomyopathy (HCC)    echo 08/08/10 - EF 35-45% LV and RV systolic function mod reduced   Obesity (BMI 30-39.9) 01/04/2014   OSA on CPAP 06/10/2006   Sleep study 02/2014 : severe apnea, corrected with nasal pillows and 8cmh2o     Osteoarthritis of right knee 08/28/2008   Qualifier: Diagnosis of  By: Cleatrice MD, Shane     Primary localized osteoarthritis of right knee    Sleep apnea    wears CPAP nightly   Type 2 diabetes mellitus with diabetic neuropathy, with long-term current use of insulin  (HCC)  06/10/2006              Ventricular tachycardia (HCC) 05/22/2015   Visit for monitoring Tikosyn  therapy 08/21/2015   Vocal cord disease     Past Surgical History:  Procedure Laterality Date   BREAST EXCISIONAL BIOPSY Right 1999   BREAST LUMPECTOMY Right    CARDIAC DEFIBRILLATOR PLACEMENT  05/02/10   Medtronic Protecta XT-DR for CHF-VT, last download 04/12/12   CHOLECYSTECTOMY     COLONOSCOPY     HAMMER TOE SURGERY Right    ICD GENERATOR CHANGEOUT N/A 09/28/2017   Procedure: ICD GENERATOR CHANGEOUT;  Surgeon: Francyne Headland, MD;  Location: [facility];  Service: Cardiovascular;  Laterality: N/A;   KNEE ARTHROSCOPY Right    PAF Ablation     By  Dr Epifanio. Now sees Dr Bascom [facility]   TOTAL KNEE ARTHROPLASTY Left 09/17/2014   Procedure: TOTAL KNEE ARTHROPLASTY;  Surgeon: Lamar Millman, MD;  Location: [facility];  Service: Orthopedics;  Laterality: Left;  pt has ICD   TOTAL KNEE ARTHROPLASTY Right 04/28/2021   Procedure: TOTAL KNEE ARTHROPLASTY;  Surgeon: Edna Toribio LABOR, MD;  Location: [facility] ORS;  Service: Orthopedics;  Laterality: Right;   TUBAL LIGATION      Outpatient Medications Prior to Visit  Medication Sig Dispense Refill   acetaminophen  (TYLENOL  8 HOUR) 650 MG CR tablet Take 1 tablet (650 mg total) by mouth every 8 (eight) hours as needed for pain. 90 tablet 2   apixaban  (ELIQUIS ) 5 MG TABS tablet Take 1 tablet (5 mg total) by mouth 2 (two) times daily. 180 tablet 1   bisoprolol  (ZEBETA ) 10 MG tablet Take 1 tablet (10 mg total) by mouth in the morning and at bedtime. 180 tablet 3   budesonide -formoterol  (SYMBICORT ) 80-4.5 MCG/ACT inhaler INHALE 2 PUFFS INTO THE LUNGS IN THE MORNING AND AT BEDTIME 10.2 g 11   buPROPion  (WELLBUTRIN  XL) 150 MG 24 hr tablet Take 1 tablet (150 mg total) by mouth daily. 90 tablet 3   dapagliflozin  propanediol (FARXIGA ) 10 MG TABS tablet Take 1 tablet (10 mg total) by mouth daily. 90 tablet 3   diclofenac  Sodium (VOLTAREN ) 1 % GEL Apply 2 g topically 4 (four) times daily. 100 g 1   dofetilide  (TIKOSYN ) 250 MCG capsule Take 1 capsule (250 mcg total) by mouth 2 (two) times daily. 180 capsule 3   famotidine  (PEPCID ) 20 MG tablet Take 1 tablet (20 mg total) by mouth daily. 90 tablet 3   fluticasone  (FLONASE ) 50 MCG/ACT nasal spray instill 1 spray into each nostril twice a day 16 g 5   glucose blood test strip Use as instructed 100 each 12   insulin  degludec (TRESIBA  FLEXTOUCH) 100 UNIT/ML FlexTouch Pen Inject 40 Units into the skin daily. 15 mL 2   Insulin  Pen Needle (BD PEN NEEDLE NANO U/F) 32G X 4 MM MISC Use as directed with insulin  once daily 100 each 2   levalbuterol  (XOPENEX  HFA) 45 MCG/ACT  inhaler Inhale 1-2 puffs into the lungs every 4 (four) hours as needed for wheezing 15 g 1   losartan  (COZAAR ) 25 MG tablet Take 1 tablet (25 mg total) by mouth at bedtime. 90 tablet 0   montelukast  (SINGULAIR ) 10 MG tablet Take 1 tablet (10 mg) by mouth at bedtime. 90 tablet 1   potassium chloride  (KLOR-CON  M) 10 MEQ tablet Take 1 tablet (10 mEq total) by mouth daily as directed 90 tablet 3   PRODIGY LANCETS 28G MISC 1 Units by Does not  apply route 4 (four) times daily. 100 each 4   rosuvastatin  (CRESTOR ) 10 MG tablet Take 1 tablet (10 mg total) by mouth daily. 90 tablet 2   Semaglutide , 2 MG/DOSE, (OZEMPIC , 2 MG/DOSE,) 8 MG/3ML SOPN Inject 2 mg into the skin once a week. 3 mL 2   torsemide  (DEMADEX ) 20 MG tablet Take 1 tablet (20 mg total) by mouth daily. 90 tablet 3   Facility-Administered Medications Prior to Visit  Medication Dose Route Frequency Provider Last Rate Last Admin   lidocaine  (XYLOCAINE ) 1 % (with pres) injection 3 mL  3 mL Other Once          Allergies:   Avelox [moxifloxacin hcl in nacl], Nsaids, Simvastatin, Ace inhibitors, Jardiance  [empagliflozin ], and Latex   Family History:  The patient's family history includes Diabetes in her brother and father; Hypertension in her brother and father.     PHYSICAL EXAM:   VS:  BP 110/63   Pulse 90   Ht 5' 6 (1.676 m)   Wt 183 lb (83 kg)   LMP 07/26/2011   SpO2 99%   BMI 29.54 kg/m      General: Alert, oriented x3, no distress, overweight, looks younger than stated age. Healthy subclavian ICD site. Head: no evidence of trauma, PERRL, EOMI, no exophtalmos or lid lag, no myxedema, no xanthelasma; normal ears, nose and oropharynx Neck: normal jugular venous pulsations and no hepatojugular reflux; brisk carotid pulses without delay and no carotid bruits Chest: clear to auscultation, no signs of consolidation by percussion or palpation, normal fremitus, symmetrical and full respiratory excursions Cardiovascular: normal position  and quality of the apical impulse, regular rhythm, normal first and second heart sounds, no murmurs, rubs or gallops Abdomen: no tenderness or distention, no masses by palpation, no abnormal pulsatility or arterial bruits, normal bowel sounds, no hepatosplenomegaly Extremities: no clubbing, cyanosis or edema; 2+ radial, ulnar and brachial pulses bilaterally; 2+ right femoral, posterior tibial and dorsalis pedis pulses; 2+ left femoral, posterior tibial and dorsalis pedis pulses; no subclavian or femoral bruits Neurological: grossly nonfocal Psych: Normal mood and affect   Wt Readings from Last 3 Encounters:  11/01/23 183 lb (83 kg)  09/21/23 183 lb 12.8 oz (83.4 kg)  08/17/23 184 lb 2 oz (83.5 kg)      Studies/Labs Reviewed:   ECHO 09/19/2018  1. The left ventricle has normal systolic function, with an ejection fraction of 55-60%. The cavity size was normal. There is mild asymmetric left ventricular hypertrophy. Left ventricular diastolic Doppler parameters are consistent with impaired  relaxation. No evidence of left ventricular regional wall motion abnormalities.  2. The average left ventricular global longitudinal strain is 14.9 %.  3. The right ventricle has normal systolic function. The cavity was normal. There is no increase in right ventricular wall thickness.  4. Left atrial size was mildly dilated.  5. The aortic valve is tricuspid. Mild sclerosis of the aortic valve. Aortic valve regurgitation is trivial by color flow Doppler.    EKG:  Personally reviewed the 05/07/2023 tracing, shows NSR, QTc 476 ms  EKG Interpretation Date/Time:  Monday November 01 2023 11:13:23 EDT Ventricular Rate:  66 PR Interval:  200 QRS Duration:  86 QT Interval:  442 QTC Calculation: 463 R Axis:   70  Text Interpretation: Atrial-paced rhythm When compared with ECG of 21-Sep-2023 08:04, No significant change was found Confirmed by Aiysha Jillson ([id number]) on 11/01/2023 11:15:02 AM         Recent  Labs: 09/21/2023: Hemoglobin  13.7; Platelets 239 10/26/2023: ALT 63; BUN 19; Creatinine, Ser 1.44; Magnesium  2.3; NT-Pro BNP 327; Potassium 4.4; Sodium 140   Lipid Panel    Component Value Date/Time   CHOL 110 08/05/2021 1118   TRIG 57 08/05/2021 1118   HDL 55 08/05/2021 1118   CHOLHDL 2.0 08/05/2021 1118   CHOLHDL 2.4 04/21/2016 1438   VLDL 15 04/21/2016 1438   LDLCALC 42 08/05/2021 1118   LDLDIRECT 94 12/07/2007 2019     ASSESSMENT:    1. VT (ventricular tachycardia) (HCC)   2. Precordial pain   3. Paroxysmal atrial fibrillation (HCC)   4. Chronic diastolic heart failure (HCC)   5. ICD (implantable cardioverter-defibrillator) in place   6. Acquired thrombophilia (HCC)   7. Essential hypertension   8. Hypercholesterolemia   9. OSA on CPAP   10. Encounter for monitoring dofetilide  therapy   11. Type 2 diabetes mellitus with diabetic neuropathy, with long-term current use of insulin  (HCC)   12. Stage 3a chronic kidney disease (HCC)         VT: Repeat echo pending, but she has no evidence of heart failure recurrence either clinically or by OptiVol or proBNP levels.  Despite recovered left ventricular ejection fraction she has had recurrent sustained VT every couple of years or so, with the frequency now substantially increased.  She did have some arm discomfort (question angina) during ventricular tachycardia suggesting that she may have coronary insufficiency, but she did not have any coronary stenosis at previous cardiac catheterization roughly 10 years ago.  She is already on a very high dose of beta-blocker (using bisoprolol  because of reactive airway disease) and dofetilide .  Will schedule for coronary CT angiography.  If coronary stenoses are identified, she should undergo revascularization, but if there is no evidence of coronary stenoses suspect we will need to change her antiarrhythmic therapy from dofetilide  to amiodarone.  Have requested EP consultation.  Also need to  review the upcoming echocardiogram and consider cardiac MRI, if the CT is nondiagnostic.   PAF: Unchanged low prevalence, asymptomatic, on dofetilide  and bisoprolol .  On appropriate anticoagulation.  CHA2DS2-VASc 5 (CHF, gender, age, hypertension, diabetes mellitus). CHF: NYHA class I, euvolemic at our estimated dry weight of 180-185 lb. N-terminal proBNP is minimally elevated.  OptiVol is stable.  Heart failure exacerbation cannot be cremated as a cause for the increased frequency of ventricular arrhythmia.  She is on maximum tolerated doses of beta-blocker and on SGLT2 inhibitor.  Most recent echo showed EF 55 to 60%, repeat echo pending.  Generally she has had low blood pressure, so she is not on Entresto or  aldosterone antagonist.  Today her blood pressure is a little higher but she is clearly nervous as well. ICD: Normal device function.  Increasingly frequent detection and treatment for what appears to be monomorphic VT.  No device setting changes appear to be appropriate at this time, but will consult with the EP Anticoagulation: No bleeding complications or falls HTN: Blood pressure higher than her usual (typically around 100/60).  She has a history of cough with ACE inhibitors.   HLP: On statin.  Due for repeat lipid profile. Overweight: Substantial weight loss with Ozempic , about 60 lb, now stabilized at BMI around 29. OSA: Reports compliance with CPAP.  Does not have daytime hypersomnolence. Tikosyn : QTc 463 ms.  Normal electrolytes.  Her ventricular arrhythmia has always been monomorphic, rather than torsades. Asthma: Tolerates bisoprolol  well.  Other beta-blockers worsened wheezing in the past. DM: Borderline acceptable hemoglobin A1c  at 8.0%.  Would recommend tighter glycemic control. CKD: Typical creatinine in the 1.3-1.4 range, creatinine clearance around 40-50.  Stable on most recent labs.    PLAN:  In order of problems listed above:  Medication Adjustments/Labs and Tests  Ordered: Current medicines are reviewed at length with the patient today.  Concerns regarding medicines are outlined above.  Medication changes, Labs and Tests ordered today are listed in the Patient Instructions below. Patient Instructions  Medication Instructions:  Your physician recommends that you continue on your current medications as directed. Please refer to the Current Medication list given to you today.  *If you need a refill on your cardiac medications before your next appointment, please call your pharmacy*   Testing/Procedures:   Your cardiac CT will be scheduled at one of the below locations:   [facility] [address] [place], [place] [address] [phone]  OR   [facility] [address]  [place], [place] [address]  If scheduled at [facility], please arrive at the [facility] (Entrance C2) of [facility] 30 minutes prior to test start time. You can use the FREE valet parking offered at entrance C (encouraged to control the heart rate for the test)  Proceed to the [facility] Radiology Department (first floor) to check-in and test prep.  All radiology patients and guests should use entrance C2 at [facility], accessed from [facility], even though the hospital's physical address listed is [address].  If scheduled at the Heart and Vascular Tower at [address], please enter the parking lot using the [address] entrance and use the FREE valet service at the patient drop-off area. Enter the buidling and check-in with registration on the main floor.  Please follow these instructions carefully (unless otherwise directed):  An IV will be required for this test and Nitroglycerin will be given.   On the Night Before the Test: Be sure to Drink plenty of water . Do not consume any caffeinated/decaffeinated beverages or chocolate 12 hours prior to your  test. Do not take any antihistamines 12 hours prior to your test.  On the Day of the Test: Drink plenty of water  until 1 hour prior to the test. Do not eat any food 1 hour prior to test. You may take your regular medications prior to the test.  Take Bisoprolol  (Zebeta ) 20 mg (2 tablets) two hours prior to test. Please HOLD Losartan  and Torsemide  on the morning of the test. Patients who wear a continuous glucose monitor MUST remove the device prior to scanning. FEMALES- please wear underwire-free bra if available, avoid dresses & tight clothing  After the Test: Drink plenty of water . After receiving IV contrast, you may experience a mild flushed feeling. This is normal. On occasion, you may experience a mild rash up to 24 hours after the test. This is not dangerous. If this occurs, you can take Benadryl  25 mg, Zyrtec, Claritin , or Allegra and increase your fluid intake. (Patients taking Tikosyn  should avoid Benadryl , and may take Zyrtec, Claritin , or Allegra) If you experience trouble breathing, this can be serious. If it is severe call 911 IMMEDIATELY. If it is mild, please call our office.  We will call to schedule your test 2-4 weeks out understanding that some insurance companies will need an authorization prior to the service being performed.   For more information and frequently asked questions, please visit our website : [email or web address]  For  non-scheduling related questions, please contact the cardiac imaging nurse navigator should you have any questions/concerns: Cardiac Imaging Nurse Navigators Direct Office Dial: [phone]   For scheduling needs, including cancellations and rescheduling, please call [place], [phone].   Follow-Up: At [facility], you and your health needs are our priority.  As part of our continuing mission to provide you with exceptional heart care, our providers are all part of one team.  This team includes your primary  Cardiologist (physician) and Advanced Practice Providers or APPs (Physician Assistants and Nurse Practitioners) who all work together to provide you with the care you need, when you need it.  Your next appointment:    September 8th at 10:40am  Provider:   Jerel Balding, MD    Other instructions: Please madk an appt with an EP provider (not APP). First available.       Signed, Jerel Balding, MD  11/01/2023 11:36 AM    [facility] [address] [place], [place], [place]  [address] Phone: [phone]; Fax: [phone]

## 2023-11-04 ENCOUNTER — Other Ambulatory Visit: Payer: Self-pay

## 2023-11-04 ENCOUNTER — Other Ambulatory Visit (HOSPITAL_COMMUNITY): Payer: Self-pay

## 2023-11-05 ENCOUNTER — Ambulatory Visit (HOSPITAL_COMMUNITY)
Admission: RE | Admit: 2023-11-05 | Discharge: 2023-11-05 | Disposition: A | Discharge status: Home / Self Care | Source: Ambulatory Visit | Attending: Cardiology | Admitting: Cardiology

## 2023-11-05 ENCOUNTER — Telehealth: Payer: Self-pay | Admitting: Pulmonary Disease

## 2023-11-05 DIAGNOSIS — I472 Ventricular tachycardia, unspecified: Secondary | ICD-10-CM | POA: Diagnosis not present

## 2023-11-05 DIAGNOSIS — I428 Other cardiomyopathies: Secondary | ICD-10-CM | POA: Diagnosis not present

## 2023-11-05 DIAGNOSIS — I4891 Unspecified atrial fibrillation: Secondary | ICD-10-CM | POA: Diagnosis not present

## 2023-11-05 DIAGNOSIS — I10 Essential (primary) hypertension: Secondary | ICD-10-CM

## 2023-11-05 LAB — ECHOCARDIOGRAM COMPLETE: S' Lateral: 4.39 cm

## 2023-11-05 NOTE — Telephone Encounter (Signed)
   Pt is returning call to get echo results 

## 2023-11-05 NOTE — Telephone Encounter (Signed)
 Patient identification verified by 2 forms.   Called and spoke to patient  -[name] over echo results. -No further questions expressed at this time.

## 2023-11-09 ENCOUNTER — Encounter (HOSPITAL_COMMUNITY): Payer: Self-pay

## 2023-11-12 ENCOUNTER — Ambulatory Visit: Payer: Self-pay | Admitting: Cardiovascular Disease

## 2023-11-12 ENCOUNTER — Ambulatory Visit (HOSPITAL_COMMUNITY)
Admission: RE | Admit: 2023-11-12 | Discharge: 2023-11-12 | Disposition: A | Discharge status: Home / Self Care | Source: Ambulatory Visit | Attending: Cardiology | Admitting: Cardiology

## 2023-11-12 DIAGNOSIS — R072 Precordial pain: Secondary | ICD-10-CM | POA: Diagnosis not present

## 2023-11-12 DIAGNOSIS — I472 Ventricular tachycardia, unspecified: Secondary | ICD-10-CM

## 2023-11-12 MED ORDER — IOHEXOL 350 MG/ML SOLN
100.0000 mL | Freq: Once | INTRAVENOUS | Status: AC | PRN
  Administered 2023-11-12: 100 mL via INTRAVENOUS

## 2023-11-12 MED ORDER — NITROGLYCERIN 0.4 MG SL SUBL
0.8000 mg | SUBLINGUAL_TABLET | Freq: Once | SUBLINGUAL | Status: AC
  Administered 2023-11-12: 0.8 mg via SUBLINGUAL

## 2023-11-17 ENCOUNTER — Ambulatory Visit (INDEPENDENT_AMBULATORY_CARE_PROVIDER_SITE_OTHER)

## 2023-11-17 DIAGNOSIS — I472 Ventricular tachycardia, unspecified: Secondary | ICD-10-CM | POA: Diagnosis not present

## 2023-11-17 LAB — CUP PACEART REMOTE DEVICE CHECK
Battery Remaining Longevity: 22 mo
Battery Voltage: 2.93 V
Brady Statistic AP VP Percent: 0.51 %
Brady Statistic AP VS Percent: 93.87 %
Brady Statistic AS VP Percent: 0.09 %
Brady Statistic AS VS Percent: 5.52 %
Brady Statistic RA Percent Paced: 90.78 %
Brady Statistic RV Percent Paced: 0.64 %
Date Time Interrogation Session: 20250806022603
HighPow Impedance: 79 Ohm
Implantable Lead Connection Status: 753985
Implantable Lead Connection Status: 753985
Implantable Lead Implant Date: 20120120
Implantable Lead Implant Date: 20120120
Implantable Lead Location: 753859
Implantable Lead Location: 753860
Implantable Lead Model: 5076
Implantable Lead Model: 6935
Implantable Pulse Generator Implant Date: 20190618
Lead Channel Impedance Value: 380 Ohm
Lead Channel Impedance Value: 437 Ohm
Lead Channel Impedance Value: 456 Ohm
Lead Channel Pacing Threshold Amplitude: 0.5 V
Lead Channel Pacing Threshold Amplitude: 0.875 V
Lead Channel Pacing Threshold Pulse Width: 0.4 ms
Lead Channel Pacing Threshold Pulse Width: 0.4 ms
Lead Channel Sensing Intrinsic Amplitude: 0.875 mV
Lead Channel Sensing Intrinsic Amplitude: 0.875 mV
Lead Channel Sensing Intrinsic Amplitude: 10.875 mV
Lead Channel Sensing Intrinsic Amplitude: 10.875 mV
Lead Channel Setting Pacing Amplitude: 2 V
Lead Channel Setting Pacing Amplitude: 2.5 V
Lead Channel Setting Pacing Pulse Width: 0.4 ms
Lead Channel Setting Sensing Sensitivity: 0.3 mV
Zone Setting Status: 755011
Zone Setting Status: 755011

## 2023-11-18 ENCOUNTER — Ambulatory Visit: Payer: Self-pay | Admitting: Cardiovascular Disease

## 2023-11-19 ENCOUNTER — Other Ambulatory Visit (HOSPITAL_COMMUNITY): Payer: Self-pay

## 2023-11-19 ENCOUNTER — Other Ambulatory Visit: Payer: Self-pay

## 2023-11-19 ENCOUNTER — Other Ambulatory Visit: Payer: Self-pay | Admitting: Family Medicine

## 2023-11-19 ENCOUNTER — Other Ambulatory Visit: Payer: Self-pay | Admitting: Cardiovascular Disease

## 2023-11-19 DIAGNOSIS — E1140 Type 2 diabetes mellitus with diabetic neuropathy, unspecified: Secondary | ICD-10-CM

## 2023-11-19 DIAGNOSIS — I480 Paroxysmal atrial fibrillation: Secondary | ICD-10-CM

## 2023-11-19 MED ORDER — APIXABAN 5 MG PO TABS
5.0000 mg | ORAL_TABLET | Freq: Two times a day (BID) | ORAL | 1 refills | Status: DC
  Filled 2023-11-19: qty 180, 90d supply, fill #0
  Filled 2024-02-16: qty 180, 90d supply, fill #1

## 2023-11-19 MED ORDER — LOSARTAN POTASSIUM 25 MG PO TABS
25.0000 mg | ORAL_TABLET | Freq: Every day | ORAL | 0 refills | Status: DC
  Filled 2023-11-19: qty 90, 90d supply, fill #0

## 2023-11-19 MED ORDER — OZEMPIC (2 MG/DOSE) 8 MG/3ML ~~LOC~~ SOPN
2.0000 mg | PEN_INJECTOR | SUBCUTANEOUS | 2 refills | Status: DC
  Filled 2023-11-19: qty 3, 28d supply, fill #0
  Filled 2023-12-16: qty 3, 28d supply, fill #1
  Filled 2024-01-12: qty 3, 28d supply, fill #2

## 2023-11-19 NOTE — Telephone Encounter (Signed)
 Prescription refill request for Eliquis  received. Indication: Afib   Last office visit: 11/01/23 ([place])  Scr: 1.44 (10/26/23)  Age: 73 Weight: 83kg  Appropriate dose. Refill sent.

## 2023-11-29 ENCOUNTER — Ambulatory Visit: Attending: Cardiovascular Disease

## 2023-11-29 DIAGNOSIS — Z95810 Presence of automatic (implantable) cardiac defibrillator: Secondary | ICD-10-CM

## 2023-11-29 DIAGNOSIS — I5032 Chronic diastolic (congestive) heart failure: Secondary | ICD-10-CM | POA: Diagnosis not present

## 2023-11-30 ENCOUNTER — Ambulatory Visit: Admitting: Podiatry

## 2023-12-03 NOTE — Progress Notes (Signed)
 EPIC Encounter for ICM Monitoring  Patient Name: [name] is a 73 y.o. female Date: 12/03/2023 Primary Care Physican: Romelle Booty, MD Primary Cardiologist: Croitoru Electrophysiologist: Croitoru 02/26/2023 Weight: 182 lbs 05/24/2023 Weight: 184-185 lbs 06/23/2023 Weight: 184 lbs 07/30/2023 Weight: 182-184 lbs 10/29/2023 Weight: 181 lbs 12/03/2023 Weight: 182 lbs                 Since 17-Nov-2023 AT/AF  4 Time in AT/AF  0.3 hr/day (1.2%)     Longest AT/AF   104 minute                   Spoke with patient and heart failure questions reviewed.  Transmission results reviewed.  Pt asymptomatic for fluid accumulation.  She reports feeling really tired all the time. She has pain around the defibrillator site that comes and goes.    Optivol thoracic impedance suggesting intermittent days with possible fluid accumulation within the last month.     Prescribed dosage:  Torsemide  20 mg 1 tablet by mouth daily.   Potassium 10 mEq 1 tablet by mouth by mouth daily.   Labs: 10/26/2023 Creatinine 1.44, BUN 19, Potassium 4.4, Sodium 140, GFR 39, BNP 327 05/07/2023 Creatinine 1.27, BUN 19, Potassium 3.9, Sodium 136, GFR 45 A complete set of results can be found in Results Review.   Recommendations:   No changes and encouraged to call if experiencing any fluid symptoms.  She has consult with Dr Kennyth 8/26.   Follow-up plan: ICM clinic phone appointment on 01/03/2024.   91 day device clinic remote transmission 02/16/2024.     Next office visit scheduled:  12/07/2023 with Dr Kennyth.    12/20/2023 with Dr Francyne.     Copy of ICM check sent to Dr. Francyne.  3 month ICM trend: 11/29/2023.    12-14 Month ICM trend:     [name], RN 12/03/2023 2:10 PM

## 2023-12-06 NOTE — Progress Notes (Unsigned)
 Electrophysiology Office Note:   Date:  12/09/2023  ID:  [name], DOB 1951-02-28, MRN [id number]  Primary Cardiologist: Jerel Balding, MD Electrophysiologist: Fonda Kitty, MD      History of Present Illness:   Diane RANDHAWA is a 73 y.o. female with h/o ong-standing history of nonischemic cardiomyopathy, chronic heart failure with recovered LV systolic function and paroxysmal atrial fibrillation, s/p ICD ([employer], gen change 2019), no evidence of coronary artery disease by cardiac catheterization in 2012 who is being seen today for evaluation of VT at the request of Dr. Balding.  Discussed the use of AI scribe software for clinical note transcription with the patient, who gave verbal consent to proceed.  History of Present Illness [name] is a 73 year old female with atrial fibrillation and ventricular tachycardia who presents for arrhythmia management.   She has a history of atrial fibrillation and ventricular tachycardia. Recently, her cardiac device has detected more ventricular tachycardia events and delivered ATP.   She is currently on dofetilide  (Tikosyn ) to manage her atrial fibrillation, which has reduced her atrial fibrillation episodes to about 3% of the time. The longest episode of atrial fibrillation lasted about an hour and a half.  She has been on Tikosyn  for a long time and recalls the initial hospitalization required to start the medication. She is aware of the restrictions associated with Tikosyn , such as avoiding certain medications.  She experiences dizziness and lightheadedness during these episodes of VT, which she fears could lead to syncope, especially during activities like driving or climbing stairs.  She mentions a past episode of arm pain that resolved after a couple of weeks, which she associates with lying down, but denies any current swelling or significant changes in arm size. No current arm swelling.   Review of systems complete and found to  be negative unless listed in HPI.   EP Information / Studies Reviewed:    EKG is ordered today. Personal review as below.  EKG Interpretation Date/Time:  Tuesday December 07 2023 13:20:05 EDT Ventricular Rate:  73 PR Interval:  194 QRS Duration:  84 QT Interval:  412 QTC Calculation: 453 R Axis:   76  Text Interpretation: Atrial-paced rhythm with single PVC Cannot rule out Anterior infarct , age undetermined When compared with ECG of 01-Nov-2023 11:13, Premature ventricular complexes are now Present Confirmed by Kitty Fonda [phone]) on 12/09/2023 6:17:44 AM   Echo 11/05/23:   1. Left ventricular ejection fraction, by estimation, is 45 to 50%. The  left ventricle has mildly decreased function. The left ventricle  demonstrates regional wall motion abnormalities with basal inferior,  inferolateral, and anterolateral severe  hypokinesis. There is mild concentric left ventricular hypertrophy. Left  ventricular diastolic parameters are consistent with Grade I diastolic  dysfunction (impaired relaxation).   2. Right ventricular systolic function is normal. The right ventricular  size is normal. There is normal pulmonary artery systolic pressure. The  estimated right ventricular systolic pressure is 31.1 mmHg.   3. The mitral valve is normal in structure. Trivial mitral valve  regurgitation. No evidence of mitral stenosis.   4. The tricuspid valve is abnormal. Tricuspid valve regurgitation is  moderate.   5. The aortic valve is tricuspid. Aortic valve regurgitation is mild. No  aortic stenosis is present.   6. Aortic dilatation noted. There is borderline dilatation of the  ascending aorta, measuring 38 mm.   7. The inferior vena cava is normal in size with greater than 50%  respiratory variability,  suggesting right atrial pressure of 3 mmHg.   Coronary CTA 11/12/23:  IMPRESSION: 1. Coronary calcium  score of 79. This was 72nd percentile for age, sex, and race matched control.   2.  Normal coronary origin with right dominance.   3. CAD-RADS 2. Mild non-obstructive CAD (25-49%). Consider non-atherosclerotic causes of chest pain. Consider preventive therapy and risk factor modification.  Risk Assessment/Calculations:    CHA2DS2-VASc Score = 5   This indicates a 7.2% annual risk of stroke. The patient's score is based upon: CHF History: 1 HTN History: 1 Diabetes History: 1 Stroke History: 0 Vascular Disease History: 0 Age Score: 1 Gender Score: 1         Physical Exam:   VS:  BP 128/75   Pulse 73   Ht 5' 6 (1.676 m)   Wt 184 lb (83.5 kg)   LMP 07/26/2011   SpO2 94%   BMI 29.70 kg/m    Wt Readings from Last 3 Encounters:  12/07/23 184 lb (83.5 kg)  11/01/23 183 lb (83 kg)  09/21/23 183 lb 12.8 oz (83.4 kg)     General: Well developed, in no acute distress.  Neck: No JVD.  Cardiac: Normal rate, regular rhythm.  Resp: Normal work of breathing.  Ext: No edema.  Neuro: No gross focal deficits.  Psych: Normal affect.   ASSESSMENT AND PLAN:    #S/p ICD:  - In clinic device interrogation was performed today.  Appropriate device function and stable lead parameters.  Estimated longevity 1.9 years.  Presenting rhythm AP-VS. AT/AF burden 3.2%.  She has had 12 nonsustained VT events since last interrogation, some requiring ATP.  No shocks. - Continue remote monitoring.  #Ventricular tachycardia:  - Would favor stopping dofetilide  and starting sotalol or amiodarone. Sotalol may not be as effective at suppressing AF as Tikosyn  has been but should be more effective at VT suppression. Amiodarone would likely be effective at both but carries more long-term risks and requires close monitoring of lung, liver, thyroid function. Discussed both medications extensively with patient and her family. They would like to think about their options and hopefully make a final decision during her upcoming visit with Dr. Francyne on 9/8.  - Recommend cardiac MRI to assess scar  burden.  #Chronic systolic heart failure: EF currently mildly reduced. Well compensated on exam.  - Cardiac MRI to assess scar burden. - Continue GDMT regimen on bisoprolol , dapagliflozin , losartan .  - Close follow up with Dr. Francyne.   #Paroxysmal atrial fibrillation: Burden 3.2% by device interrogation. #High risk medication use: Dofetilide . Qtc ~44ms on last ECG. Cr 1.44 on 7/15. -Continue dofetilide  250mcg BID.  -Monitor burden with ICD.  #Hypercoagulable state due to AF: No bleeding issues. -Continue Eliquis  5mg  BID.   Follow up with Dr. Kennyth in 4 weeks  Signed, Fonda Kennyth, MD

## 2023-12-07 ENCOUNTER — Ambulatory Visit: Attending: Cardiology | Admitting: Cardiology

## 2023-12-07 ENCOUNTER — Encounter: Payer: Self-pay | Admitting: Cardiology

## 2023-12-07 VITALS — BP 128/75 | HR 73 | Ht 66.0 in | Wt 184.0 lb

## 2023-12-07 DIAGNOSIS — I5032 Chronic diastolic (congestive) heart failure: Secondary | ICD-10-CM

## 2023-12-07 DIAGNOSIS — I472 Ventricular tachycardia, unspecified: Secondary | ICD-10-CM | POA: Diagnosis not present

## 2023-12-07 DIAGNOSIS — Z95810 Presence of automatic (implantable) cardiac defibrillator: Secondary | ICD-10-CM

## 2023-12-07 DIAGNOSIS — I5022 Chronic systolic (congestive) heart failure: Secondary | ICD-10-CM | POA: Diagnosis not present

## 2023-12-07 DIAGNOSIS — D6869 Other thrombophilia: Secondary | ICD-10-CM

## 2023-12-07 DIAGNOSIS — I480 Paroxysmal atrial fibrillation: Secondary | ICD-10-CM | POA: Diagnosis not present

## 2023-12-07 DIAGNOSIS — Z79899 Other long term (current) drug therapy: Secondary | ICD-10-CM

## 2023-12-07 NOTE — Patient Instructions (Signed)
 Medication Instructions:  Your physician recommends that you continue on your current medications as directed. Please refer to the Current Medication list given to you today.  *If you need a refill on your cardiac medications before your next appointment, please call your pharmacy*  Follow-Up: At [facility], you and your health needs are our priority.  As part of our continuing mission to provide you with exceptional heart care, our providers are all part of one team.  This team includes your primary Cardiologist (physician) and Advanced Practice Providers or APPs (Physician Assistants and Nurse Practitioners) who all work together to provide you with the care you need, when you need it.  Please let us  know if you decide to start on a medication (sotalol vs amiodarone)

## 2023-12-10 ENCOUNTER — Encounter: Payer: Self-pay | Admitting: Family Medicine

## 2023-12-10 ENCOUNTER — Ambulatory Visit (INDEPENDENT_AMBULATORY_CARE_PROVIDER_SITE_OTHER): Admitting: Family Medicine

## 2023-12-10 VITALS — HR 70 | Ht 66.0 in | Wt 187.0 lb

## 2023-12-10 DIAGNOSIS — E1140 Type 2 diabetes mellitus with diabetic neuropathy, unspecified: Secondary | ICD-10-CM | POA: Diagnosis not present

## 2023-12-10 DIAGNOSIS — Z794 Long term (current) use of insulin: Secondary | ICD-10-CM | POA: Diagnosis not present

## 2023-12-10 LAB — POCT GLYCOSYLATED HEMOGLOBIN (HGB A1C): HbA1c, POC (controlled diabetic range): 8.4 % — AB (ref 0.0–7.0)

## 2023-12-10 NOTE — Assessment & Plan Note (Addendum)
 A1c 8.4 today from 8.0 at last visit.  However suspect that this is largely due to recent life events, unable to maintain a healthy routine with exercise and diet. Currently she is maxed out on Farxiga  and Ozempic  with good CBG ranges fasting at home We will continue current regimen for now and allow some time for lifestyle changes.  Follow-up in 3 months, at that time would consider mealtime insulin  if worsening.  Discussed that patient may benefit from seeing Dr. Koval but at this time she has a lot going on and would prefer to hold off until next visit

## 2023-12-10 NOTE — Progress Notes (Signed)
    SUBJECTIVE:   CHIEF COMPLAINT / HPI:   T2DM -Current medication regimen: Farxiga  10 mg daily, Tresiba  40 units daily, Ozempic  2 mg weekly.  Also on losartan  25 mg daily and Crestor  10 mg daily. -Home CBGs: fasting mostly low 100s, ranging 89-130s  -Denies polyuria, polydipsia, abdominal pain, chest pain, shortness of breath -Foot exam: UTD -Eye exam: due Reports life has been busy recently and diet took a hit. ICD had a shock and she has not been driving or going to gym due to this Has previously tried metformin and had GI side effects, does not want to restart this  Lab Results  Component Value Date   HGBA1C 8.4 (A) 12/10/2023   HGBA1C 8.0 (A) 08/17/2023   HGBA1C 7.9 (A) 04/08/2023      PERTINENT  PMH / PSH: T2DM, CHF, paroxysmal A-fib and V. tach s/p ICD followed by cardiology  OBJECTIVE:   Pulse 70   Ht 5' 6 (1.676 m)   Wt 187 lb (84.8 kg)   LMP 07/26/2011   SpO2 97%   BMI 30.18 kg/m    General: NAD, pleasant, able to participate in exam Cardiac: RRR, no murmurs auscultated Respiratory: CTAB, normal WOB Abdomen: soft, non-tender, non-distended, normoactive bowel sounds Extremities: warm and well perfused, no edema or cyanosis Skin: warm and dry, no rashes noted Neuro: alert, no obvious focal deficits, speech normal Psych: Normal affect and mood  ASSESSMENT/PLAN:   Assessment & Plan Type 2 diabetes mellitus with diabetic neuropathy, with long-term current use of insulin  (HCC) A1c 8.4 today from 8.0 at last visit.  However suspect that this is largely due to recent life events, unable to maintain a healthy routine with exercise and diet. Currently she is maxed out on Farxiga  and Ozempic  with good CBG ranges fasting at home We will continue current regimen for now and allow some time for lifestyle changes.  Follow-up in 3 months, at that time would consider mealtime insulin  if worsening.  Discussed that patient may benefit from seeing Dr. Koval but at this time  she has a lot going on and would prefer to hold off until next visit   Payton Coward, MD [facility]

## 2023-12-10 NOTE — Patient Instructions (Addendum)
 Continue taking all your current medications  Please schedule an appointment with your eye doctor  Lets follow-up in 3 months.  Keep a close eye on your sugars at home and continue eating a healthy diet  If any of your results from today are abnormal and/or require changes to your medical care, I will give you a call. Otherwise, I will send you a letter in the mail or a message on MyChart.

## 2023-12-11 LAB — MICROALBUMIN / CREATININE URINE RATIO
Creatinine, Urine: 93.3 mg/dL
Microalb/Creat Ratio: <3 mg/g{creat} (ref 0–29)
Microalbumin, Urine: <3 ug/mL

## 2023-12-14 ENCOUNTER — Ambulatory Visit: Payer: Self-pay | Admitting: Family Medicine

## 2023-12-16 ENCOUNTER — Other Ambulatory Visit: Payer: Self-pay

## 2023-12-16 ENCOUNTER — Other Ambulatory Visit: Payer: Self-pay | Admitting: Family Medicine

## 2023-12-16 ENCOUNTER — Other Ambulatory Visit (HOSPITAL_COMMUNITY): Payer: Self-pay

## 2023-12-16 MED ORDER — TRESIBA FLEXTOUCH 100 UNIT/ML ~~LOC~~ SOPN
40.0000 [IU] | PEN_INJECTOR | Freq: Every day | SUBCUTANEOUS | 2 refills | Status: DC
  Filled 2023-12-16: qty 15, 37d supply, fill #0
  Filled 2024-01-17: qty 15, 37d supply, fill #1
  Filled 2024-03-04: qty 15, 37d supply, fill #2

## 2023-12-20 ENCOUNTER — Encounter: Payer: Self-pay | Admitting: Cardiovascular Disease

## 2023-12-20 ENCOUNTER — Ambulatory Visit

## 2023-12-20 ENCOUNTER — Ambulatory Visit: Attending: Cardiovascular Disease | Admitting: Cardiovascular Disease

## 2023-12-20 ENCOUNTER — Ambulatory Visit (INDEPENDENT_AMBULATORY_CARE_PROVIDER_SITE_OTHER)

## 2023-12-20 ENCOUNTER — Ambulatory Visit
Admission: RE | Admit: 2023-12-20 | Discharge: 2023-12-20 | Disposition: A | Discharge status: Home / Self Care | Source: Ambulatory Visit | Attending: Family Medicine | Admitting: Family Medicine

## 2023-12-20 VITALS — BP 112/70 | HR 91 | Ht 66.0 in | Wt 188.1 lb

## 2023-12-20 DIAGNOSIS — E663 Overweight: Secondary | ICD-10-CM

## 2023-12-20 DIAGNOSIS — I472 Ventricular tachycardia, unspecified: Secondary | ICD-10-CM | POA: Diagnosis not present

## 2023-12-20 DIAGNOSIS — Z23 Encounter for immunization: Secondary | ICD-10-CM | POA: Diagnosis not present

## 2023-12-20 DIAGNOSIS — Z95810 Presence of automatic (implantable) cardiac defibrillator: Secondary | ICD-10-CM | POA: Diagnosis not present

## 2023-12-20 DIAGNOSIS — G4733 Obstructive sleep apnea (adult) (pediatric): Secondary | ICD-10-CM | POA: Diagnosis not present

## 2023-12-20 DIAGNOSIS — D6869 Other thrombophilia: Secondary | ICD-10-CM | POA: Diagnosis not present

## 2023-12-20 DIAGNOSIS — I5042 Chronic combined systolic (congestive) and diastolic (congestive) heart failure: Secondary | ICD-10-CM

## 2023-12-20 DIAGNOSIS — J4540 Moderate persistent asthma, uncomplicated: Secondary | ICD-10-CM

## 2023-12-20 DIAGNOSIS — I10 Essential (primary) hypertension: Secondary | ICD-10-CM | POA: Diagnosis not present

## 2023-12-20 DIAGNOSIS — E7800 Pure hypercholesterolemia, unspecified: Secondary | ICD-10-CM | POA: Diagnosis not present

## 2023-12-20 DIAGNOSIS — Z1231 Encounter for screening mammogram for malignant neoplasm of breast: Secondary | ICD-10-CM | POA: Diagnosis not present

## 2023-12-20 DIAGNOSIS — N1831 Chronic kidney disease, stage 3a: Secondary | ICD-10-CM | POA: Diagnosis not present

## 2023-12-20 DIAGNOSIS — E1140 Type 2 diabetes mellitus with diabetic neuropathy, unspecified: Secondary | ICD-10-CM | POA: Diagnosis not present

## 2023-12-20 DIAGNOSIS — I480 Paroxysmal atrial fibrillation: Secondary | ICD-10-CM

## 2023-12-20 DIAGNOSIS — Z794 Long term (current) use of insulin: Secondary | ICD-10-CM

## 2023-12-20 NOTE — Progress Notes (Signed)
 Patient ID: [name], female   DOB: 13-Sep-1950, 73 y.o.   MRN: [id number] Patient ID: [name], female   DOB: 05/28/1950, 74 y.o.   MRN: [id number]    Cardiology Office Note    Date:  12/20/2023   ID:  [name], DOB Aug 22, 1950, MRN [id number]  PCP:  Romelle Booty, MD  Cardiologist:   Jerel Balding, MD   No chief complaint on file.   History of Present Illness:  [name] is a 73 y.o. female with a long-standing history of nonischemic cardiomyopathy, chronic heart failure with recovered LV systolic function and paroxysmal atrial fibrillation, s/p ICD (Medtronic, gen change 2019), no evidence of coronary artery disease by cardiac catheterization in 2012.  At the time of her original presentation in 2012, LVEF was decreased at 15-20% but she had recovery of LV function with EF 55-60% by echo in June 2020, EF most recently 45-50% by echo 11/05/2023  There has recently been an increase in the episodes of sustained ventricular arrhythmia despite antiarrhythmic therapy with dofetilide  and bisoprolol .  She had a remote appropriate previous treatment for rapid monomorphic ventricular tachycardia (July 27, 2022) successfully terminated with ATP during charge.  In early June 2025 she had an episode of sustained monomorphic VT that did not resolve with ATP and she received a single high-voltage shock with resolution.  She did not experience syncope.  She had another episode of VT requiring therapy on 10/01/2023.  Multiple episodes of nonsustained and sustained VT occurred this time terminating with antitachycardia pacing (7 rounds of ATP with multiple dirty breaks).  Once again it appears to be a monomorphic arrhythmia.  During this episode she had discomfort in her left shoulder radiating down her left arm that resolved when the arrhythmia resolved.    On each of these episodes of VT her electrolytes were checked and found to be normal.  There is no evidence of heart failure exacerbation either  by proBNP levels or by OptiVol, let alone by clinical status, which has been unchanged.  She underwent coronary CT angiography which did not show any significant coronary stenoses, although she had a minor LAD stenosis (25-49%) and her calcium  score was mildly elevated (70th percentile).  Her burden of atrial arrhythmia remains unchanged at approximately 5% with episodes of atrial fibrillation that are relatively brief and well rate controlled.  When not in atrial fibrillation she has virtually 100% atrial paced, ventricular sensed rhythm with a good heart rate histogram distribution.  Following her most recent episodes of ventricular tachycardia she has been unable to drive and her activity level has decreased substantially, since she is unable to drive to the gym.  Activity has gone from 3-4 hours a day to just 1 hour a day.  Other than this change in activity level has been no clinical deterioration.  She does not have exertional dyspnea or angina, palpitations, dizziness, syncope, leg edema, orthopnea, PND or any neurological complaints.  She is compliant with CPAP and denies daytime hypersomnolence.  She has reasonable metabolic control that her hemoglobin A1c has deteriorated and is now up to 8.4%, likely due to reduced activity level.  Her most recent ECG from 12/07/2023 shows atrial paced, ventricular sensed rhythm with a single PVC and a QTc interval of 453 ms on dofetilide .  She had antitachycardia pacing for ventricular tachycardia in December 2016 and again in April 2022 and April 2024.  Otherwise she occasional brief bursts of nonsustained VT.. On July 19, 2020 and  July 27, 2022 she had very fast VT (CL 280 ms), mostly monomorphic.  Each time the arrhythmia was terminated with ATP.  She has a history of moderate nonischemic cardiomyopathy (no coronary disease by cardiac catheterization January 2012), recurrent paroxysmal atrial fibrillation status post 2 ablation procedures (Dr.  Charlott), with history of ventricular fibrillation arrest while on treatment with diltiazem and dofetilide  and a prolonged QT interval, probably related to simultaneous quinolone therapy. She has a dual-chamber Medtronic Protecta defibrillator implanted in January 2012. In December 2016 her defibrillator recorded an episode of sustained monomorphic ventricular tachycardia. Antitachycardia pacing was unsuccessful at converting the arrhythmia, but she had a dirty break before defibrillator shock was delivered. The shock was aborted. Polymorphic VT lasting 2 seconds was recorded in the fall of 2017 during asthma exacerbation, but did not require intervention. Had sustained monomorphic VT on July 19, 2020. ATP successfully terminated the arrhythmia.  Most recent echo June 2020 showed normal LVEF 55-60%, but reduced GLS -14%. At LV angiography in 2012, EF was 15-20%.  Past Medical History:  Diagnosis Date   AICD (automatic cardioverter/defibrillator) present    Anemia    Anxiety    Arthritis    Arthritis of carpometacarpal (CMC) joint of thumb 03/26/2016   Asthma    has had multiple hospitalizations for this   Asthma, moderate persistent 12/10/2011   Atrial fibrillation (HCC)    ablation x 2 [facility], 01/2006, 2011.  on warfarin   Cardiac arrest (HCC) 04/2010   in hospital for pneumonia when this occured- occured at the hospital   CHF (congestive heart failure) (HCC) 2012   Echo 08/08/10 by [facility]. EF 35-45%. LV systolic function moderately reduced. Moderate global hypokinesis of LV.  RV systolic function moderately reduced. Mild MR. Trace AR.   Chronic diastolic heart failure (HCC) 01/07/2018   CKD (chronic kidney disease) stage 2, GFR 60-89 ml/min 02/22/2012   Her cr range 1.2-1.5 since Jan 2012 after hospitalization. 10/13 Estimated Creatinine Clearance: 69.3 ml/min (by C-G formula based on Cr of 1).     Complication of anesthesia    difficult time waking up after anesthesia    Depression    Diabetes mellitus    Type 2   Diabetic neuropathy (HCC) 09/18/2011   GASTROESOPHAGEAL REFLUX, NO ESOPHAGITIS 06/10/2006   Qualifier: Diagnosis of  By: Sharron Railing     GERD (gastroesophageal reflux disease) 03/23/2018   History of hiatal hernia    History of kidney stones    HYPERCHOLESTEROLEMIA 06/10/2006   LDL 93 at 01/2011 check-  Continue pravastatin  20mg  po daily.  --discuss health modifications and possibly increaseing dose at next appt.  Cardiology- Dr little- stopped pravachol  on 05/21/11- 2/2 allergies?     Hyperlipidemia    Hypertension    ICD (St. Jude Protecta dual-chamber),secondary prevention (VF arrest) January 2012 11/19/2012   Iliotibial band syndrome, left 11/01/2018   Limb pain 06/04/2008   LLE, Baker's cyst in popliteal fossa, no DVT   Long term current use of anticoagulant therapy 08/21/2015   Non-ischemic cardiomyopathy (HCC)    echo 08/08/10 - EF 35-45% LV and RV systolic function mod reduced   Obesity (BMI 30-39.9) 01/04/2014   OSA on CPAP 06/10/2006   Sleep study 02/2014 : severe apnea, corrected with nasal pillows and 8cmh2o     Osteoarthritis of right knee 08/28/2008   Qualifier: Diagnosis of  By: Cleatrice MD, Shane     Primary localized osteoarthritis of right knee    Sleep apnea  wears CPAP nightly   Type 2 diabetes mellitus with diabetic neuropathy, with long-term current use of insulin  (HCC) 06/10/2006              Ventricular tachycardia (HCC) 05/22/2015   Visit for monitoring Tikosyn  therapy 08/21/2015   Vocal cord disease     Past Surgical History:  Procedure Laterality Date   BREAST EXCISIONAL BIOPSY Right 1999   BREAST LUMPECTOMY Right    CARDIAC DEFIBRILLATOR PLACEMENT  05/02/10   Medtronic Protecta XT-DR for CHF-VT, last download 04/12/12   CHOLECYSTECTOMY     COLONOSCOPY     HAMMER TOE SURGERY Right    ICD GENERATOR CHANGEOUT N/A 09/28/2017   Procedure: ICD GENERATOR CHANGEOUT;  Surgeon: Francyne Headland, MD;  Location:  [facility];  Service: Cardiovascular;  Laterality: N/A;   KNEE ARTHROSCOPY Right    PAF Ablation     By Dr Epifanio. Now sees Dr Bascom at [facility]   TOTAL KNEE ARTHROPLASTY Left 09/17/2014   Procedure: TOTAL KNEE ARTHROPLASTY;  Surgeon: Lamar Millman, MD;  Location: [facility] OR;  Service: Orthopedics;  Laterality: Left;  pt has ICD   TOTAL KNEE ARTHROPLASTY Right 04/28/2021   Procedure: TOTAL KNEE ARTHROPLASTY;  Surgeon: Edna Toribio LABOR, MD;  Location: [facility]S;  Service: Orthopedics;  Laterality: Right;   TUBAL LIGATION      Outpatient Medications Prior to Visit  Medication Sig Dispense Refill   acetaminophen  (TYLENOL  8 HOUR) 650 MG CR tablet Take 1 tablet (650 mg total) by mouth every 8 (eight) hours as needed for pain. 90 tablet 2   apixaban  (ELIQUIS ) 5 MG TABS tablet Take 1 tablet (5 mg total) by mouth 2 (two) times daily. 180 tablet 1   bisoprolol  (ZEBETA ) 10 MG tablet Take 1 tablet (10 mg total) by mouth in the morning and at bedtime. 180 tablet 3   budesonide -formoterol  (SYMBICORT ) 80-4.5 MCG/ACT inhaler INHALE 2 PUFFS INTO THE LUNGS IN THE MORNING AND AT BEDTIME (Patient taking differently: Inhale 2 puffs into the lungs as needed. INHALE 2 PUFFS INTO THE LUNGS IN THE MORNING AND AT BEDTIME) 10.2 g 11   buPROPion  (WELLBUTRIN  XL) 150 MG 24 hr tablet Take 1 tablet (150 mg total) by mouth daily. 90 tablet 3   dapagliflozin  propanediol (FARXIGA ) 10 MG TABS tablet Take 1 tablet (10 mg total) by mouth daily. 90 tablet 3   diclofenac  Sodium (VOLTAREN ) 1 % GEL Apply 2 g topically 4 (four) times daily. 100 g 1   dofetilide  (TIKOSYN ) 250 MCG capsule Take 1 capsule (250 mcg total) by mouth 2 (two) times daily. 180 capsule 3   famotidine  (PEPCID ) 20 MG tablet Take 1 tablet (20 mg total) by mouth daily. 90 tablet 3   fluticasone  (FLONASE ) 50 MCG/ACT nasal spray instill 1 spray into each nostril twice a day 16 g 5   glucose blood test strip Use as instructed 100 each 12   insulin  degludec  (TRESIBA  FLEXTOUCH) 100 UNIT/ML FlexTouch Pen Inject 40 Units into the skin daily. 15 mL 2   Insulin  Pen Needle (BD PEN NEEDLE NANO U/F) 32G X 4 MM MISC Use as directed with insulin  once daily 100 each 2   levalbuterol  (XOPENEX  HFA) 45 MCG/ACT inhaler Inhale 1-2 puffs into the lungs every 4 (four) hours as needed for wheezing 15 g 1   losartan  (COZAAR ) 25 MG tablet Take 1 tablet (25 mg total) by mouth at bedtime. 90 tablet 0   montelukast  (SINGULAIR ) 10 MG tablet Take 1 tablet (10  mg) by mouth at bedtime. 90 tablet 1   potassium chloride  (KLOR-CON  M) 10 MEQ tablet Take 1 tablet (10 mEq total) by mouth daily as directed 90 tablet 3   PRODIGY LANCETS 28G MISC 1 Units by Does not apply route 4 (four) times daily. (Patient taking differently: 1 Units by Does not apply route in the morning, at noon, and at bedtime.) 100 each 4   rosuvastatin  (CRESTOR ) 10 MG tablet Take 1 tablet (10 mg total) by mouth daily. 90 tablet 2   Semaglutide , 2 MG/DOSE, (OZEMPIC , 2 MG/DOSE,) 8 MG/3ML SOPN Inject 2 mg into the skin once a week. 3 mL 2   torsemide  (DEMADEX ) 20 MG tablet Take 1 tablet (20 mg total) by mouth daily. 90 tablet 3   Facility-Administered Medications Prior to Visit  Medication Dose Route Frequency Provider Last Rate Last Admin   lidocaine  (XYLOCAINE ) 1 % (with pres) injection 3 mL  3 mL Other Once          Allergies:   Avelox [moxifloxacin hcl in nacl], Nsaids, Simvastatin, Ace inhibitors, Jardiance  [empagliflozin ], and Latex   Family History:  The patient's family history includes Diabetes in her brother and father; Hypertension in her brother and father.     PHYSICAL EXAM:   VS:  BP 112/70 (BP Location: Left Arm, Patient Position: Sitting, Cuff Size: Normal)   Pulse 91   Ht 5' 6 (1.676 m)   Wt 188 lb 1.6 oz (85.3 kg)   LMP 07/26/2011   SpO2 98%   BMI 30.36 kg/m       General: Alert, oriented x3, no distress, healthy left subclavian defibrillator site. Head: no evidence of trauma,  PERRL, EOMI, no exophtalmos or lid lag, no myxedema, no xanthelasma; normal ears, nose and oropharynx Neck: normal jugular venous pulsations and no hepatojugular reflux; brisk carotid pulses without delay and no carotid bruits Chest: clear to auscultation, no signs of consolidation by percussion or palpation, normal fremitus, symmetrical and full respiratory excursions Cardiovascular: normal position and quality of the apical impulse, regular rhythm, normal first and second heart sounds, no murmurs, rubs or gallops Abdomen: no tenderness or distention, no masses by palpation, no abnormal pulsatility or arterial bruits, normal bowel sounds, no hepatosplenomegaly Extremities: no clubbing, cyanosis or edema; 2+ radial, ulnar and brachial pulses bilaterally; 2+ right femoral, posterior tibial and dorsalis pedis pulses; 2+ left femoral, posterior tibial and dorsalis pedis pulses; no subclavian or femoral bruits Neurological: grossly nonfocal Psych: Normal mood and affect    Wt Readings from Last 3 Encounters:  12/20/23 188 lb 1.6 oz (85.3 kg)  12/10/23 187 lb (84.8 kg)  12/07/23 184 lb (83.5 kg)      Studies/Labs Reviewed:   ECHO 11/05/2023   1. Left ventricular ejection fraction, by estimation, is 45 to 50%. The  left ventricle has mildly decreased function. The left ventricle  demonstrates regional wall motion abnormalities with basal inferior,  inferolateral, and anterolateral severe  hypokinesis. There is mild concentric left ventricular hypertrophy. Left  ventricular diastolic parameters are consistent with Grade I diastolic  dysfunction (impaired relaxation).   2. Right ventricular systolic function is normal. The right ventricular  size is normal. There is normal pulmonary artery systolic pressure. The  estimated right ventricular systolic pressure is 31.1 mmHg.   3. The mitral valve is normal in structure. Trivial mitral valve  regurgitation. No evidence of mitral stenosis.   4.  The tricuspid valve is abnormal. Tricuspid valve regurgitation is  moderate.   5.  The aortic valve is tricuspid. Aortic valve regurgitation is mild. No  aortic stenosis is present.   6. Aortic dilatation noted. There is borderline dilatation of the  ascending aorta, measuring 38 mm.   7. The inferior vena cava is normal in size with greater than 50%  respiratory variability, suggesting right atrial pressure of 3 mmHg.     EKG:  Personally reviewed the 12/07/2023 tracing, showing atrial paced, ventricular sensed rhythm with a single PVC, QTc 453 ms  EKG Interpretation Date/Time:    Ventricular Rate:    PR Interval:    QRS Duration:    QT Interval:    QTC Calculation:   R Axis:      Text Interpretation:           Recent Labs: 09/21/2023: Hemoglobin 13.7; Platelets 239 10/26/2023: ALT 63; BUN 19; Creatinine, Ser 1.44; Magnesium  2.3; NT-Pro BNP 327; Potassium 4.4; Sodium 140   Lipid Panel    Component Value Date/Time   CHOL 110 08/05/2021 1118   TRIG 57 08/05/2021 1118   HDL 55 08/05/2021 1118   CHOLHDL 2.0 08/05/2021 1118   CHOLHDL 2.4 04/21/2016 1438   VLDL 15 04/21/2016 1438   LDLCALC 42 08/05/2021 1118   LDLDIRECT 94 12/07/2007 2019     ASSESSMENT:    1. Ventricular tachycardia (HCC)   2. PAF (paroxysmal atrial fibrillation) (HCC)   3. Chronic combined systolic and diastolic heart failure (HCC)   4. ICD (implantable cardioverter-defibrillator) in place   5. Acquired thrombophilia (HCC)   6. Essential hypertension   7. Hypercholesterolemia   8. Overweight   9. OSA on CPAP   10. Moderate persistent asthma without complication   11. Type 2 diabetes mellitus with diabetic neuropathy, with long-term current use of insulin  (HCC)   12. Stage 3a chronic kidney disease (HCC)          VT: No clear etiology for her increasingly frequent episodes of VT that have required repeated ICD intervention.  Just a few weeks ago her echocardiogram showed a pretty good LVEF  at 45-50%, no signs of hypervolemia by clinical evaluation/thoracic impedance/labs, electrolytes have been normal, she does not have any meaningful coronary obstruction, remains compliant with CPAP.  These events are occurring despite beta-blocker and dofetilide  therapy.  Discussed options to switch to sotalol or amiodarone, reviewing the pros and cons of each approach.  Personally I would recommend trying short acting sotalol first, because if this is poorly tolerated or if it is ineffective, it is easier to transition then to the longer acting amiodarone.  It will require 3-day hospitalization after washout from dofetilide  she has an MRI conditional ICD and a cardiac MRI may be informative as well, although it is unlikely to change the treatment plan.  We can get the MRI while she is in the hospital. PAF: Appropriately anticoagulated.  Dofetilide /bisoprolol  has done a good job in keeping episodes of atrial fibrillation brief and well rate controlled and asymptomatic.  Hopefully sotalol will work just as well.  CHA2DS2-VASc 5 (CHF, gender, age, hypertension, diabetes mellitus). CHF: Remains NYHA functional class I and euvolemic without recent adjustments in the dose of loop diuretic.  Unlikely to tolerate Entresto due to her blood pressure.  Otherwise on comprehensive therapy with angiotensin receptor blocker, beta-blocker, SGLT2 inhibitor, although not yet on spironolactone  (questional benefit in view of near normal LVEF and excellent functional status).   ICD: Her device was implanted in 2012 for secondary prevention after she presented with VT/VF arrest normal device  function, MRI conditional.   Anticoagulation: Denies bleeding or falls or any interruptions in therapy. HTN: BP is typically typically around 100/60 on her current meds.  She has a history of cough with ACE inhibitors.   HLP: On statin.  Due for repeat lipid profile, but will obtain this when she is in the hospital since labs will have to be  redrawn anyway. Overweight: Substantial weight loss with Ozempic , total of approximately 60 lb, BMI stable around 30 OSA: Compliant with CPAP and does not have daytime hypersomnolence. Tikosyn : Her VT has always been monomorphic, not torsades de pointes.  QTc most recently 453 ms. Asthma: Tolerates bisoprolol  well.  Other beta-blockers worsened wheezing in the past. DM: Glycemic control has deteriorated in the last few weeks with A1c up to 8.4%.  I suspect her inability to go to the gym is the reason CKD: At baseline her creatinine is 1.3-1.4, estimated GFR 40-50.    PLAN:  In order of problems listed above:  Medication Adjustments/Labs and Tests Ordered: Current medicines are reviewed at length with the patient today.  Concerns regarding medicines are outlined above.  Medication changes, Labs and Tests ordered today are listed in the Patient Instructions below. Patient Instructions  Medication Instructions:  You will receive a phone call with further information about Hospital admission to transition from Tikosyn  to Sotalol *If you need a refill on your cardiac medications before your next appointment, please call your pharmacy*  Lab Work: None ordered If you have labs (blood work) drawn today and your tests are completely normal, you will receive your results only by: MyChart Message (if you have MyChart) OR A paper copy in the mail If you have any lab test that is abnormal or we need to change your treatment, we will call you to review the results.  Testing/Procedures: None ordered  Follow-Up: At [facility], you and your health needs are our priority.  As part of our continuing mission to provide you with exceptional heart care, our providers are all part of one team.  This team includes your primary Cardiologist (physician) and Advanced Practice Providers or APPs (Physician Assistants and Nurse Practitioners) who all work together to provide you with the care you need,  when you need it.  Your next appointment:   6 month(s)  Provider:   Jerel Balding, MD    We recommend signing up for the patient portal called MyChart.  Sign up information is provided on this After Visit Summary.  MyChart is used to connect with patients for Virtual Visits (Telemedicine).  Patients are able to view lab/test results, encounter notes, upcoming appointments, etc.  Non-urgent messages can be sent to your provider as well.   To learn more about what you can do with MyChart, go to [email or web address].            Signed, Jerel Balding, MD  12/20/2023 4:00 PM    [facility] [address] [place], [place], [place]  [address] Phone: [phone]; Fax: [phone]

## 2023-12-20 NOTE — Patient Instructions (Signed)
 Medication Instructions:  You will receive a phone call with further information about Hospital admission to transition from Tikosyn  to Sotalol *If you need a refill on your cardiac medications before your next appointment, please call your pharmacy*  Lab Work: None ordered If you have labs (blood work) drawn today and your tests are completely normal, you will receive your results only by: MyChart Message (if you have MyChart) OR A paper copy in the mail If you have any lab test that is abnormal or we need to change your treatment, we will call you to review the results.  Testing/Procedures: None ordered  Follow-Up: At [facility], you and your health needs are our priority.  As part of our continuing mission to provide you with exceptional heart care, our providers are all part of one team.  This team includes your primary Cardiologist (physician) and Advanced Practice Providers or APPs (Physician Assistants and Nurse Practitioners) who all work together to provide you with the care you need, when you need it.  Your next appointment:   6 month(s)  Provider:   Jerel Balding, MD    We recommend signing up for the patient portal called MyChart.  Sign up information is provided on this After Visit Summary.  MyChart is used to connect with patients for Virtual Visits (Telemedicine).  Patients are able to view lab/test results, encounter notes, upcoming appointments, etc.  Non-urgent messages can be sent to your provider as well.   To learn more about what you can do with MyChart, go to [email or web address].

## 2023-12-20 NOTE — Progress Notes (Signed)
 Patient presents to nurse clinic for flu vaccination. Administered in RD, pt tolerated well.   Chiquita JAYSON English, RN

## 2023-12-23 ENCOUNTER — Encounter (HOSPITAL_COMMUNITY): Payer: Self-pay

## 2023-12-23 ENCOUNTER — Telehealth: Payer: Self-pay | Admitting: Emergency Medicine

## 2023-12-23 NOTE — Telephone Encounter (Signed)
 Spoke with the patient and the daughter. Asked if Monday or Tuesday would be preferable for admission for Sotalol. Monday works better for the daughter- who will be bringing the patient to the hospital.  Direct admit will be for Monday 12/27/23. Instructed that the last dose of Tikosyn  will be on Friday 12/24/23. Informed that the hospital will call when a bed is available for admission on 12/27/23. They will go to [facility], to admitting, after they receive a call that the bed is ready. Informed that it will be for 3 days. She verbalized understanding of all information.   (Called admitting- for a direct admit to Cardiac Telemetry, for a Sotalol admission)

## 2023-12-27 ENCOUNTER — Other Ambulatory Visit (HOSPITAL_COMMUNITY): Payer: Self-pay

## 2023-12-27 ENCOUNTER — Inpatient Hospital Stay (HOSPITAL_COMMUNITY)
Admission: RE | Admit: 2023-12-27 | Discharge: 2023-12-31 | DRG: 309 | Disposition: A | Discharge status: Home / Self Care | Source: Ambulatory Visit | Attending: Cardiology | Admitting: Cardiology

## 2023-12-27 DIAGNOSIS — Z886 Allergy status to analgesic agent status: Secondary | ICD-10-CM | POA: Diagnosis not present

## 2023-12-27 DIAGNOSIS — Z888 Allergy status to other drugs, medicaments and biological substances status: Secondary | ICD-10-CM

## 2023-12-27 DIAGNOSIS — J4540 Moderate persistent asthma, uncomplicated: Secondary | ICD-10-CM | POA: Diagnosis present

## 2023-12-27 DIAGNOSIS — I428 Other cardiomyopathies: Secondary | ICD-10-CM | POA: Diagnosis present

## 2023-12-27 DIAGNOSIS — E7800 Pure hypercholesterolemia, unspecified: Secondary | ICD-10-CM | POA: Diagnosis not present

## 2023-12-27 DIAGNOSIS — I4891 Unspecified atrial fibrillation: Principal | ICD-10-CM | POA: Diagnosis present

## 2023-12-27 DIAGNOSIS — K219 Gastro-esophageal reflux disease without esophagitis: Secondary | ICD-10-CM | POA: Diagnosis present

## 2023-12-27 DIAGNOSIS — Z833 Family history of diabetes mellitus: Secondary | ICD-10-CM | POA: Diagnosis not present

## 2023-12-27 DIAGNOSIS — Z7951 Long term (current) use of inhaled steroids: Secondary | ICD-10-CM | POA: Diagnosis not present

## 2023-12-27 DIAGNOSIS — E1140 Type 2 diabetes mellitus with diabetic neuropathy, unspecified: Secondary | ICD-10-CM | POA: Diagnosis present

## 2023-12-27 DIAGNOSIS — Z96653 Presence of artificial knee joint, bilateral: Secondary | ICD-10-CM | POA: Diagnosis not present

## 2023-12-27 DIAGNOSIS — Z79899 Other long term (current) drug therapy: Secondary | ICD-10-CM

## 2023-12-27 DIAGNOSIS — I10 Essential (primary) hypertension: Secondary | ICD-10-CM

## 2023-12-27 DIAGNOSIS — Z794 Long term (current) use of insulin: Secondary | ICD-10-CM | POA: Diagnosis not present

## 2023-12-27 DIAGNOSIS — M79605 Pain in left leg: Secondary | ICD-10-CM

## 2023-12-27 DIAGNOSIS — N1831 Chronic kidney disease, stage 3a: Secondary | ICD-10-CM | POA: Diagnosis present

## 2023-12-27 DIAGNOSIS — Z7985 Long-term (current) use of injectable non-insulin antidiabetic drugs: Secondary | ICD-10-CM

## 2023-12-27 DIAGNOSIS — Z8674 Personal history of sudden cardiac arrest: Secondary | ICD-10-CM

## 2023-12-27 DIAGNOSIS — I130 Hypertensive heart and chronic kidney disease with heart failure and stage 1 through stage 4 chronic kidney disease, or unspecified chronic kidney disease: Secondary | ICD-10-CM | POA: Diagnosis present

## 2023-12-27 DIAGNOSIS — Z91040 Latex allergy status: Secondary | ICD-10-CM

## 2023-12-27 DIAGNOSIS — D6869 Other thrombophilia: Secondary | ICD-10-CM | POA: Diagnosis not present

## 2023-12-27 DIAGNOSIS — E1122 Type 2 diabetes mellitus with diabetic chronic kidney disease: Secondary | ICD-10-CM | POA: Diagnosis present

## 2023-12-27 DIAGNOSIS — I480 Paroxysmal atrial fibrillation: Secondary | ICD-10-CM | POA: Diagnosis present

## 2023-12-27 DIAGNOSIS — I472 Ventricular tachycardia, unspecified: Secondary | ICD-10-CM | POA: Diagnosis not present

## 2023-12-27 DIAGNOSIS — I5022 Chronic systolic (congestive) heart failure: Secondary | ICD-10-CM | POA: Diagnosis not present

## 2023-12-27 DIAGNOSIS — Z7901 Long term (current) use of anticoagulants: Secondary | ICD-10-CM | POA: Diagnosis not present

## 2023-12-27 DIAGNOSIS — G4733 Obstructive sleep apnea (adult) (pediatric): Secondary | ICD-10-CM | POA: Diagnosis present

## 2023-12-27 DIAGNOSIS — Z8249 Family history of ischemic heart disease and other diseases of the circulatory system: Secondary | ICD-10-CM

## 2023-12-27 DIAGNOSIS — Z95810 Presence of automatic (implantable) cardiac defibrillator: Secondary | ICD-10-CM | POA: Diagnosis not present

## 2023-12-27 DIAGNOSIS — Z881 Allergy status to other antibiotic agents status: Secondary | ICD-10-CM

## 2023-12-27 DIAGNOSIS — Z555 Less than a high school diploma: Secondary | ICD-10-CM

## 2023-12-27 LAB — CBC
HCT: 40.7 % (ref 36.0–46.0)
Hemoglobin: 12.8 g/dL (ref 12.0–15.0)
MCH: 29.1 pg (ref 26.0–34.0)
MCHC: 31.4 g/dL (ref 30.0–36.0)
MCV: 92.5 fL (ref 80.0–100.0)
Platelets: 230 K/uL (ref 150–400)
RBC: 4.4 MIL/uL (ref 3.87–5.11)
RDW: 13.2 % (ref 11.5–15.5)
WBC: 5.6 K/uL (ref 4.0–10.5)
nRBC: 0 % (ref 0.0–0.2)

## 2023-12-27 LAB — BASIC METABOLIC PANEL WITH GFR
Anion gap: 11 (ref 5–15)
BUN: 20 mg/dL (ref 8–23)
CO2: 23 mmol/L (ref 22–32)
Calcium: 8.2 mg/dL — ABNORMAL LOW (ref 8.9–10.3)
Chloride: 101 mmol/L (ref 98–111)
Creatinine, Ser: 1.21 mg/dL — ABNORMAL HIGH (ref 0.44–1.00)
GFR, Estimated: 48 mL/min — ABNORMAL LOW (ref >60)
Glucose, Bld: 225 mg/dL — ABNORMAL HIGH (ref 70–99)
Potassium: 3.8 mmol/L (ref 3.5–5.1)
Sodium: 135 mmol/L (ref 135–145)

## 2023-12-27 LAB — GLUCOSE, CAPILLARY: Glucose-Capillary: 207 mg/dL — ABNORMAL HIGH (ref 70–99)

## 2023-12-27 LAB — MAGNESIUM: Magnesium: 2 mg/dL (ref 1.7–2.4)

## 2023-12-27 LAB — HEMOGLOBIN A1C
Hgb A1c MFr Bld: 7.9 % — ABNORMAL HIGH (ref 4.8–5.6)
Mean Plasma Glucose: 180.03 mg/dL

## 2023-12-27 MED ORDER — ACETAMINOPHEN 325 MG PO TABS
650.0000 mg | ORAL_TABLET | Freq: Three times a day (TID) | ORAL | Status: DC | PRN
  Administered 2023-12-30: 650 mg via ORAL
  Filled 2023-12-27: qty 2

## 2023-12-27 MED ORDER — INSULIN ASPART 100 UNIT/ML IJ SOLN
0.0000 [IU] | Freq: Three times a day (TID) | INTRAMUSCULAR | Status: DC
  Administered 2023-12-28: 3 [IU] via SUBCUTANEOUS
  Administered 2023-12-29 (×2): 2 [IU] via SUBCUTANEOUS
  Administered 2023-12-29: 3 [IU] via SUBCUTANEOUS
  Administered 2023-12-30: 8 [IU] via SUBCUTANEOUS
  Administered 2023-12-30: 3 [IU] via SUBCUTANEOUS
  Administered 2023-12-31: 2 [IU] via SUBCUTANEOUS

## 2023-12-27 MED ORDER — BUPROPION HCL ER (XL) 150 MG PO TB24
150.0000 mg | ORAL_TABLET | Freq: Every day | ORAL | Status: DC
  Administered 2023-12-28 – 2023-12-31 (×4): 150 mg via ORAL
  Filled 2023-12-27 (×4): qty 1

## 2023-12-27 MED ORDER — LOSARTAN POTASSIUM 25 MG PO TABS
25.0000 mg | ORAL_TABLET | Freq: Every day | ORAL | Status: DC
  Administered 2023-12-28 – 2023-12-30 (×3): 25 mg via ORAL
  Filled 2023-12-27 (×3): qty 1

## 2023-12-27 MED ORDER — SOTALOL HCL 80 MG PO TABS
80.0000 mg | ORAL_TABLET | Freq: Two times a day (BID) | ORAL | Status: DC
  Administered 2023-12-27: 80 mg via ORAL
  Filled 2023-12-27 (×2): qty 1

## 2023-12-27 MED ORDER — POTASSIUM CHLORIDE CRYS ER 20 MEQ PO TBCR
40.0000 meq | EXTENDED_RELEASE_TABLET | Freq: Once | ORAL | Status: AC
Start: 2023-12-27 — End: 2023-12-27
  Administered 2023-12-27: 40 meq via ORAL
  Filled 2023-12-27: qty 2

## 2023-12-27 MED ORDER — ONDANSETRON HCL 4 MG/2ML IJ SOLN: 4.0000 mg | Freq: Four times a day (QID) | INTRAMUSCULAR | Status: DC | PRN

## 2023-12-27 MED ORDER — DAPAGLIFLOZIN PROPANEDIOL 10 MG PO TABS
10.0000 mg | ORAL_TABLET | Freq: Every day | ORAL | Status: DC
  Administered 2023-12-28 – 2023-12-31 (×4): 10 mg via ORAL
  Filled 2023-12-27 (×4): qty 1

## 2023-12-27 MED ORDER — TORSEMIDE 20 MG PO TABS
20.0000 mg | ORAL_TABLET | Freq: Every day | ORAL | Status: DC
  Administered 2023-12-28 – 2023-12-31 (×4): 20 mg via ORAL
  Filled 2023-12-27 (×4): qty 1

## 2023-12-27 MED ORDER — SOTALOL HCL 80 MG PO TABS
80.0000 mg | ORAL_TABLET | Freq: Two times a day (BID) | ORAL | Status: DC
  Filled 2023-12-27: qty 1

## 2023-12-27 MED ORDER — ROSUVASTATIN CALCIUM 5 MG PO TABS
10.0000 mg | ORAL_TABLET | Freq: Every day | ORAL | Status: DC
  Administered 2023-12-28 – 2023-12-31 (×4): 10 mg via ORAL
  Filled 2023-12-27 (×4): qty 2

## 2023-12-27 MED ORDER — FLUTICASONE FUROATE-VILANTEROL 100-25 MCG/ACT IN AEPB
1.0000 | INHALATION_SPRAY | Freq: Every day | RESPIRATORY_TRACT | Status: DC
  Administered 2023-12-28 – 2023-12-31 (×4): 1 via RESPIRATORY_TRACT
  Filled 2023-12-27: qty 28

## 2023-12-27 MED ORDER — MAGNESIUM SULFATE 2 GM/50ML IV SOLN
2.0000 g | Freq: Once | INTRAVENOUS | Status: AC
  Administered 2023-12-27: 2 g via INTRAVENOUS
  Filled 2023-12-27: qty 50

## 2023-12-27 MED ORDER — APIXABAN 5 MG PO TABS
5.0000 mg | ORAL_TABLET | Freq: Two times a day (BID) | ORAL | Status: DC
  Administered 2023-12-28 – 2023-12-31 (×7): 5 mg via ORAL
  Filled 2023-12-27 (×8): qty 1

## 2023-12-27 NOTE — Progress Notes (Signed)
 Pharmacy Consult for Sotalol  Electrolyte Replacement  Pharmacy consulted to assist in monitoring and replacing electrolytes in this 73 y.o. female admitted on 12/27/2023 undergoing sotalol  initiation . First sotalol  dose: 80 mg BID   Patient is transitioning from Tikosyn - last dose 9/12   Labs:    Component Value Date/Time   K 3.8 12/27/2023 1950   MG 2.3 10/26/2023 1213     Plan: Potassium: K 3.8-3.9:  Hold Sotalol  initiation and give KCl 40 mEq po x1 then begin Sotalol  at least 2hr after KCl dose - do not need to recheck K   Magnesium : Mg 1.8-2: Give Mg 2 gm IV x1 to prevent Mg from dropping below 1.8 - do not need to recheck Mg. Appropriate to initiate Sotalol    Thank you for allowing pharmacy to participate in this patient's care   Massie DELENA Fila 12/27/2023  8:49 PM

## 2023-12-27 NOTE — H&P (Addendum)
 Cardiology Admission History and Physical   Patient ID: [name] MRN: [id number]; DOB: 26-Jan-1951   Admission date: (Not on file)  PCP:  Diane Booty, [name]   [facility] Providers Cardiologist:  Diane Balding, [name]  Electrophysiologist:  Diane Kitty, [name]    Chief Complaint:  VT  Patient Profile: [name] is a 73 y.o. female with  HTN, HLD, OSA w/CPAP, asthma, DM 2, CKD (IIIa) NICM > recovered/improved LVEF ICD AFib VT  who is being admitted 12/27/2023 for the sotalol  initiation.  History of Present Illness: [name] saw Dr. Everitt 12/20/23, noting increased episode of VT (July 27, 2022) successfully terminated with ATP during charge.   June 2025 she had an episode of sustained monomorphic VT that did not resolve with ATP and she received a single high-voltage shock with resolution.   She did not experience syncope.   She had another episode of VT requiring therapy on 10/01/2023.   Multiple episodes of nonsustained and sustained VT occurred this time terminating with antitachycardia pacing (7 rounds of ATP with multiple dirty breaks).   Appears to be a monomorphic arrhythmia   On each of these episodes of VT her electrolytes were checked and found to be normal.  There was no evidence of heart failure exacerbation either by proBNP levels or by OptiVol, as well as clinical status, which has been unchanged.   She underwent coronary CT angiography which did not show any significant coronary stenoses, although she had a minor LAD stenosis (25-49%) and her calcium  score was mildly elevated (70th percentile). (no coronary disease by cardiac catheterization January 2012)  AFib burden stable ~ 5% A paced otherwise TTE 11/05/23 LVEF 45-50%  Her Tikosyn  stopped with plans to pursue admission for sotalol   Patient reports that her last dose of Tikosyn  was taken on 9/12, as instructed. She has been compliant with her eliquis  5 mg BID and has not missed any doses. If  labs/creat allows, plan to start sotalol  80mg  BID (July creat was 1.44  w/clearance 48 would require daily dosing)  She arrived on 6E around 7:30 PM and was accompanied by her daughter. Reports that she is feeling well. All questions answered.   Past Medical History:  Diagnosis Date   AICD (automatic cardioverter/defibrillator) present    Anemia    Anxiety    Arthritis    Arthritis of carpometacarpal (CMC) joint of thumb 03/26/2016   Asthma    has had multiple hospitalizations for this   Asthma, moderate persistent 12/10/2011   Atrial fibrillation (HCC)    ablation x 2 [facility], 01/2006, 2011.  on warfarin   Cardiac arrest (HCC) 04/2010   in hospital for pneumonia when this occured- occured at the hospital   CHF (congestive heart failure) (HCC) 2012   Echo 08/08/10 by [facility]. EF 35-45%. LV systolic function moderately reduced. Moderate global hypokinesis of LV.  RV systolic function moderately reduced. Mild MR. Trace AR.   Chronic diastolic heart failure (HCC) 01/07/2018   CKD (chronic kidney disease) stage 2, GFR 60-89 ml/min 02/22/2012   Her cr range 1.2-1.5 since Jan 2012 after hospitalization. 10/13 Estimated Creatinine Clearance: 69.3 ml/min (by C-G formula based on Cr of 1).     Complication of anesthesia    difficult time waking up after anesthesia   Depression    Diabetes mellitus    Type 2   Diabetic neuropathy (HCC) 09/18/2011   GASTROESOPHAGEAL REFLUX, NO ESOPHAGITIS 06/10/2006   Qualifier: Diagnosis of  By: [name]     GERD (gastroesophageal reflux disease) 03/23/2018   History of hiatal hernia    History of kidney stones    HYPERCHOLESTEROLEMIA 06/10/2006   LDL 93 at 01/2011 check-  Continue pravastatin  20mg  po daily.  --discuss health modifications and possibly increaseing dose at next appt.  Cardiology- Dr little- stopped pravachol  on 05/21/11- 2/2 allergies?     Hyperlipidemia    Hypertension    ICD (St. Jude Protecta dual-chamber),secondary  prevention (VF arrest) January 2012 11/19/2012   Iliotibial band syndrome, left 11/01/2018   Limb pain 06/04/2008   LLE, Baker's cyst in popliteal fossa, no DVT   Long term current use of anticoagulant therapy 08/21/2015   Non-ischemic cardiomyopathy (HCC)    echo 08/08/10 - EF 35-45% LV and RV systolic function mod reduced   Obesity (BMI 30-39.9) 01/04/2014   OSA on CPAP 06/10/2006   Sleep study 02/2014 : severe apnea, corrected with nasal pillows and 8cmh2o     Osteoarthritis of right knee 08/28/2008   Qualifier: Diagnosis of  By: [name], [name]     Primary localized osteoarthritis of right knee    Sleep apnea    wears CPAP nightly   Type 2 diabetes mellitus with diabetic neuropathy, with long-term current use of insulin  (HCC) 06/10/2006              Ventricular tachycardia (HCC) 05/22/2015   Visit for monitoring Tikosyn  therapy 08/21/2015   Vocal cord disease    Past Surgical History:  Procedure Laterality Date   BREAST EXCISIONAL BIOPSY Right 1999   BREAST LUMPECTOMY Right    CARDIAC DEFIBRILLATOR PLACEMENT  05/02/10   Medtronic Protecta XT-DR for CHF-VT, last download 04/12/12   CHOLECYSTECTOMY     COLONOSCOPY     HAMMER TOE SURGERY Right    ICD GENERATOR CHANGEOUT N/A 09/28/2017   Procedure: ICD GENERATOR CHANGEOUT;  Surgeon: Diane Headland, [name];  Location: [facility];  Service: Cardiovascular;  Laterality: N/A;   KNEE ARTHROSCOPY Right    PAF Ablation     By Dr Epifanio. Now sees Dr Bascom at [facility]   TOTAL KNEE ARTHROPLASTY Left 09/17/2014   Procedure: TOTAL KNEE ARTHROPLASTY;  Surgeon: Diane Millman, [name];  Location: [facility];  Service: Orthopedics;  Laterality: Left;  pt has ICD   TOTAL KNEE ARTHROPLASTY Right 04/28/2021   Procedure: TOTAL KNEE ARTHROPLASTY;  Surgeon: [name] LABOR, [name];  Location: [facility] ORS;  Service: Orthopedics;  Laterality: Right;   TUBAL LIGATION       Medications Prior to Admission: Prior to Admission medications   Medication Sig  Start Date End Date Taking? Authorizing Provider  acetaminophen  (TYLENOL  8 HOUR) 650 MG CR tablet Take 1 tablet (650 mg total) by mouth every 8 (eight) hours as needed for pain. 05/01/20   Meccariello, Con PARAS, [name]  apixaban  (ELIQUIS ) 5 MG TABS tablet Take 1 tablet (5 mg total) by mouth 2 (two) times daily. 11/19/23   Croitoru, Mihai, [name]  bisoprolol  (ZEBETA ) 10 MG tablet Take 1 tablet (10 mg total) by mouth in the morning and at bedtime. 01/07/23   Croitoru, Mihai, [name]  budesonide -formoterol  (SYMBICORT ) 80-4.5 MCG/ACT inhaler INHALE 2 PUFFS INTO THE LUNGS IN THE MORNING AND AT BEDTIME Patient taking differently: Inhale 2 puffs into the lungs as needed. INHALE 2 PUFFS INTO THE LUNGS IN THE MORNING AND AT BEDTIME 03/14/20   Jude Harden GAILS, [name]  buPROPion  (WELLBUTRIN  XL) 150 MG 24 hr tablet Take 1 tablet (150 mg total)  by mouth daily. 01/07/23   Diane Booty, [name]  dapagliflozin  propanediol (FARXIGA ) 10 MG TABS tablet Take 1 tablet (10 mg total) by mouth daily. 10/21/23   Diane Booty, [name]  diclofenac  Sodium (VOLTAREN ) 1 % GEL Apply 2 g topically 4 (four) times daily. 05/01/20   Meccariello, Con PARAS, [name]  dofetilide  (TIKOSYN ) 250 MCG capsule Take 1 capsule (250 mcg total) by mouth 2 (two) times daily. 07/29/23   Fenton, Clint R, PA  famotidine  (PEPCID ) 20 MG tablet Take 1 tablet (20 mg total) by mouth daily. 04/26/23   Diane Booty, [name]  fluticasone  (FLONASE ) 50 MCG/ACT nasal spray instill 1 spray into each nostril twice a day 12/30/16   Nestor, Jennings E, [name]  glucose blood test strip Use as instructed 08/03/17   Chrystal Lamarr RAMAN, [name]  insulin  degludec (TRESIBA  FLEXTOUCH) 100 UNIT/ML FlexTouch Pen Inject 40 Units into the skin daily. 12/16/23   Diane Booty, [name]  Insulin  Pen Needle (BD PEN NEEDLE NANO U/F) 32G X 4 MM MISC Use as directed with insulin  once daily 10/18/23   Diane Booty, [name]  levalbuterol  (XOPENEX  HFA) 45 MCG/ACT inhaler Inhale 1-2 puffs into the lungs every 4 (four) hours as needed for wheezing 07/21/22    Jude Harden GAILS, [name]  losartan  (COZAAR ) 25 MG tablet Take 1 tablet (25 mg total) by mouth at bedtime. 11/19/23   Diane Booty, [name]  montelukast  (SINGULAIR ) 10 MG tablet Take 1 tablet (10 mg) by mouth at bedtime. 09/27/23   Jude Harden GAILS, [name]  potassium chloride  (KLOR-CON  M) 10 MEQ tablet Take 1 tablet (10 mEq total) by mouth daily as directed 07/29/23   Fenton, Clint R, PA  PRODIGY LANCETS 28G MISC 1 Units by Does not apply route 4 (four) times daily. Patient taking differently: 1 Units by Does not apply route in the morning, at noon, and at bedtime. 08/03/17   Chrystal Lamarr RAMAN, [name]  rosuvastatin  (CRESTOR ) 10 MG tablet Take 1 tablet (10 mg total) by mouth daily. 06/02/23   Croitoru, Mihai, [name]  Semaglutide , 2 MG/DOSE, (OZEMPIC , 2 MG/DOSE,) 8 MG/3ML SOPN Inject 2 mg into the skin once a week. 11/19/23   Diane Booty, [name]  torsemide  (DEMADEX ) 20 MG tablet Take 1 tablet (20 mg total) by mouth daily. 07/22/23   Croitoru, Jerel, [name]     Allergies:    Allergies  Allergen Reactions   Avelox [Moxifloxacin Hcl In Nacl] Other (See Comments)    Cardiac arrest per pt   Nsaids Other (See Comments)    Cardiac arrest per pt    Simvastatin Other (See Comments)    Muscle pain   Ace Inhibitors Other (See Comments) and Cough   Jardiance  [Empagliflozin ] Other (See Comments)    Felt crazy, fatigue, sweating, denies hypoglycemia while taking   Latex Rash    Social History:   Social History   Socioeconomic History   Marital status: Widowed    Spouse name: Not on file   Number of children: 3   Years of education: 10   Highest education level: 10th grade  Occupational History   Occupation: Retired  Tobacco Use   Smoking status: Never    Passive exposure: Never   Smokeless tobacco: Never   Tobacco comments:    Never smoked as of 05/07/23  Vaping Use   Vaping status: Never Used  Substance and Sexual Activity   Alcohol use: No   Drug use: No   Sexual activity: Not Currently  Other Topics Concern    Not  on file  Social History Narrative      [facility]:   She is originally from [place]. Has always lived in [place]. 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 Currently has a small dog. No bird or mold exposure.       Current Social History August 2023      Patient lives with daughter, Doyal, in two level home    One son in [place]  and another daughter in [place]     Patient is a widow   Transportation: Patient has own vehicle and drives herself    Important Relationships: Daughter, Doyal     Education: 10 th grade   Interests Fun: Read, do puzzles    Religious/Personal Beliefs: Non-Denominational                                                                                                Patient exercises 2x per week using stationary bike for ~45 minutes   Social Drivers of Corporate investment banker Strain: Low Risk  (09/25/2022)   Overall Financial Resource Strain (CARDIA)    Difficulty of Paying Living Expenses: Not hard at all  Food Insecurity: No Food Insecurity (09/25/2022)   Hunger Vital Sign    Worried About Running Out of Food in the Last Year: Never true    Ran Out of Food in the Last Year: Never true  Transportation Needs: No Transportation Needs (09/25/2022)   PRAPARE - Administrator, Civil Service (Medical): No    Lack of Transportation (Non-Medical): No  Physical Activity: Insufficiently Active (09/25/2022)   Exercise Vital Sign    Days of Exercise per Week: 2 days    Minutes of Exercise per Session: 60 min  Stress: No Stress Concern Present (09/25/2022)   [employer] of Occupational Health - Occupational Stress Questionnaire    Feeling of Stress : Not at all  Social Connections: Moderately Isolated (09/25/2022)   Social Connection and Isolation Panel    Frequency of Communication with Friends and Family: More than three times a week    Frequency  of Social Gatherings with Friends and Family: Three times a week    Attends Religious Services: More than 4 times per year    Active Member of Clubs or Organizations: No    Attends Banker Meetings: Never    Marital Status: Widowed  Intimate Partner Violence: Not At Risk (09/25/2022)   Humiliation, Afraid, Rape, and Kick questionnaire    Fear of Current or Ex-Partner: No    Emotionally Abused: No    Physically Abused: No    Sexually Abused: No     Family History:  The patient's family history includes Diabetes in her brother and father; Hypertension in her brother and father. There is no history of Lung disease, Cancer, Rheumatologic disease, or Breast cancer.    ROS:  Please see the history of present illness.  All other ROS reviewed and negative.     Physical Exam/Data: There were no vitals filed  for this visit. No intake or output data in the 24 hours ending 12/27/23 1217    12/20/2023   10:00 AM 12/10/2023    8:26 AM 12/07/2023    1:17 PM  Last 3 Weights  Weight (lbs) 188 lb 1.6 oz 187 lb 184 lb  Weight (kg) 85.322 kg 84.823 kg 83.462 kg     There is no height or weight on file to calculate BMI.  General:  Well nourished, well developed, in no acute distress. Sitting comfortably on the side of the bed  HEENT: normal Neck: no  JVD Vascular: No carotid bruits; Radial pulses 2+ bilaterally   Cardiac:  normal S1, S2; RRR; no murmur   Lungs:  clear to auscultation bilaterally, no wheezing, rhonchi or rales. Normal WOB on room air  Abd: soft, nontender  Ext: no  edema in BLE Musculoskeletal:  No deformities  Skin: warm and dry  Neuro:  CNs 2-12 intact, no focal abnormalities noted Psych:  Normal affect   EKG:  The ECG that was done 8/26 was personally reviewed and demonstrates atrial paced rhythm with single PVC, HR 73 BPM   Relevant CV Studies:  ECHO 11/05/2023  1. Left ventricular ejection fraction, by estimation, is 45 to 50%. The  left ventricle has  mildly decreased function. The left ventricle  demonstrates regional wall motion abnormalities with basal inferior,  inferolateral, and anterolateral severe  hypokinesis. There is mild concentric left ventricular hypertrophy. Left  ventricular diastolic parameters are consistent with Grade I diastolic  dysfunction (impaired relaxation).   2. Right ventricular systolic function is normal. The right ventricular  size is normal. There is normal pulmonary artery systolic pressure. The  estimated right ventricular systolic pressure is 31.1 mmHg.   3. The mitral valve is normal in structure. Trivial mitral valve  regurgitation. No evidence of mitral stenosis.   4. The tricuspid valve is abnormal. Tricuspid valve regurgitation is  moderate.   5. The aortic valve is tricuspid. Aortic valve regurgitation is mild. No  aortic stenosis is present.   6. Aortic dilatation noted. There is borderline dilatation of the  ascending aorta, measuring 38 mm.   7. The inferior vena cava is normal in size with greater than 50%  respiratory variability, suggesting right atrial pressure of 3 mmHg.   Laboratory Data: High Sensitivity Troponin:  No results for input(s): TROPONINIHS in the last 720 hours.    ChemistryNo results for input(s): NA, K, CL, CO2, GLUCOSE, BUN, CREATININE, CALCIUM , MG, GFRNONAA, GFRAA, ANIONGAP in the last 168 hours.  No results for input(s): PROT, ALBUMIN, AST, ALT, ALKPHOS, BILITOT in the last 168 hours. Lipids No results for input(s): CHOL, TRIG, HDL, LABVLDL, LDLCALC, CHOLHDL in the last 168 hours. HematologyNo results for input(s): WBC, RBC, HGB, HCT, MCV, MCH, MCHC, RDW, PLT in the last 168 hours. Thyroid No results for input(s): TSH, FREET4 in the last 168 hours. BNPNo results for input(s): BNP, PROBNP in the last 168 hours.  DDimer No results for input(s): DDIMER in the last 168  hours.  Radiology/Studies:  No results found.   Assessment and Plan: VT - Per Dr. Tyrone office note 9/8, no clear etiology for her increasingly frequent episodes of VT that have required repeated ICD intervention. EF was 45-50% in 10/2023.  - Transitioning from tikosyn  (last taken 9/12) to sotalol   - Consider cardiac MRI this admission   Paroxysmal AFib - CHA2DS2Vasc is 5, on eliquis  5 mg BID. Reports excellent compliance.  - Ordered EKG - Ordered  Stat BMP to be collected this evening. If creatinine clearance >60, start sotalol  80mg  BID. If lower, start sotalol  80 mg daily. Ordered sotalol  80 mg to be given tonight to start the load  - Ordered BMP  - Ordered continuous telemetry for sotalol  load   HTN - Resumed home meds on admission   OSA - Ordered CPAP  DM - Continue home farxiga  10 mg daily  - Ordered SSI and heart healthy/carb modified diet   Risk Assessment/Risk Scores:     CHA2DS2-VASc Score = 5   This indicates a 7.2% annual risk of stroke. The patient's score is based upon: CHF History: 1 HTN History: 1 Diabetes History: 1 Stroke History: 0 Vascular Disease History: 0 Age Score: 1 Gender Score: 1    Severity of Illness: The appropriate patient status for this patient is INPATIENT. Inpatient status is judged to be reasonable and necessary in order to provide the required intensity of service to ensure the patient's safety. The patient's presenting symptoms, physical exam findings, and initial radiographic and laboratory data in the context of their chronic comorbidities is felt to place them at high risk for further clinical deterioration. Furthermore, it is not anticipated that the patient will be medically stable for discharge from the hospital within 2 midnights of admission.   * I certify that at the point of admission it is my clinical judgment that the patient will require inpatient hospital care spanning beyond 2 midnights from the point of admission  due to high intensity of service, high risk for further deterioration and high frequency of surveillance required.*   For questions or updates, please contact [facility] Please consult [email or web address] for contact info under    Signed, Charlies Macario Arthur, PA-C  12/27/2023 12:17 PM   Personally seen and examined. Agree with above.  Admitted for sotalol  initiation.  This is secondary to ventricular tachycardia frequent episodes that required repeated ICD intervention.  EF 45% in July 2025.  She is off of Tikosyn .  Her last dose of Tikosyn  was on 12/24/2023.  We will go ahead and start sotalol  80 mg tonight.  Dose can be adjusted tomorrow based upon creatinine clearance.  If creatinine clearance greater than 60 continue with 80 twice daily, if lower 80 mg daily. Monitor on Tele.   No chest pain no shortness of breath.  Arrived here at 7:30 PM.  Daughter is present.  She is feeling well.  Paroxysmal atrial fibrillation - CHA2DS2-VASc 5, Eliquis  5 twice daily.  Obstructive sleep apnea - Continue with CPAP  Diabetes mellitus - Continue with Farxiga  10 mg a day sliding scale insulin   Hypertension - Home meds as above.  Oneil Parchment, [name]

## 2023-12-27 NOTE — Telephone Encounter (Signed)
 Pt calling back as she is supposed to be admitted today but has not received a call about the time. Please advise.

## 2023-12-27 NOTE — Telephone Encounter (Signed)
 Spoke with pt. Let pt know that the hospital is the ones that would be calling for her admit. Pt stated understanding.

## 2023-12-28 ENCOUNTER — Encounter (HOSPITAL_COMMUNITY): Payer: Self-pay | Admitting: Cardiology

## 2023-12-28 ENCOUNTER — Telehealth (HOSPITAL_COMMUNITY): Payer: Self-pay | Admitting: Pharmacy Technician

## 2023-12-28 ENCOUNTER — Other Ambulatory Visit (HOSPITAL_COMMUNITY): Payer: Self-pay

## 2023-12-28 ENCOUNTER — Other Ambulatory Visit: Payer: Self-pay

## 2023-12-28 DIAGNOSIS — I480 Paroxysmal atrial fibrillation: Secondary | ICD-10-CM | POA: Diagnosis not present

## 2023-12-28 DIAGNOSIS — Z95810 Presence of automatic (implantable) cardiac defibrillator: Secondary | ICD-10-CM

## 2023-12-28 DIAGNOSIS — I472 Ventricular tachycardia, unspecified: Secondary | ICD-10-CM | POA: Diagnosis not present

## 2023-12-28 DIAGNOSIS — D6869 Other thrombophilia: Secondary | ICD-10-CM | POA: Diagnosis not present

## 2023-12-28 DIAGNOSIS — I5022 Chronic systolic (congestive) heart failure: Secondary | ICD-10-CM | POA: Diagnosis not present

## 2023-12-28 LAB — BASIC METABOLIC PANEL WITH GFR
Anion gap: 7 (ref 5–15)
BUN: 16 mg/dL (ref 8–23)
CO2: 25 mmol/L (ref 22–32)
Calcium: 7.9 mg/dL — ABNORMAL LOW (ref 8.9–10.3)
Chloride: 105 mmol/L (ref 98–111)
Creatinine, Ser: 1.23 mg/dL — ABNORMAL HIGH (ref 0.44–1.00)
GFR, Estimated: 47 mL/min — ABNORMAL LOW (ref >60)
Glucose, Bld: 159 mg/dL — ABNORMAL HIGH (ref 70–99)
Potassium: 4.2 mmol/L (ref 3.5–5.1)
Sodium: 137 mmol/L (ref 135–145)

## 2023-12-28 LAB — CBC
HCT: 35.9 % — ABNORMAL LOW (ref 36.0–46.0)
Hemoglobin: 11.4 g/dL — ABNORMAL LOW (ref 12.0–15.0)
MCH: 29.5 pg (ref 26.0–34.0)
MCHC: 31.8 g/dL (ref 30.0–36.0)
MCV: 92.8 fL (ref 80.0–100.0)
Platelets: 194 K/uL (ref 150–400)
RBC: 3.87 MIL/uL (ref 3.87–5.11)
RDW: 13.3 % (ref 11.5–15.5)
WBC: 4.7 K/uL (ref 4.0–10.5)
nRBC: 0 % (ref 0.0–0.2)

## 2023-12-28 LAB — GLUCOSE, CAPILLARY
Glucose-Capillary: 80 mg/dL (ref 70–99)
Glucose-Capillary: 147 mg/dL — ABNORMAL HIGH (ref 70–99)
Glucose-Capillary: 120 mg/dL — ABNORMAL HIGH (ref 70–99)
Glucose-Capillary: 148 mg/dL — ABNORMAL HIGH (ref 70–99)
Glucose-Capillary: 181 mg/dL — ABNORMAL HIGH (ref 70–99)
Glucose-Capillary: 112 mg/dL — ABNORMAL HIGH (ref 70–99)

## 2023-12-28 LAB — MAGNESIUM: Magnesium: 2.3 mg/dL (ref 1.7–2.4)

## 2023-12-28 MED ORDER — SODIUM CHLORIDE 0.9 % IV SOLN: 250.0000 mL | INTRAVENOUS | Status: AC | PRN

## 2023-12-28 MED ORDER — SOTALOL HCL 120 MG PO TABS
120.0000 mg | ORAL_TABLET | Freq: Every day | ORAL | Status: DC
  Administered 2023-12-28: 120 mg via ORAL
  Filled 2023-12-28: qty 1

## 2023-12-28 MED ORDER — SODIUM CHLORIDE 0.9% FLUSH: 3.0000 mL | INTRAVENOUS | Status: DC | PRN

## 2023-12-28 MED ORDER — SODIUM CHLORIDE 0.9% FLUSH
3.0000 mL | Freq: Two times a day (BID) | INTRAVENOUS | Status: DC
  Administered 2023-12-28 – 2023-12-31 (×7): 3 mL via INTRAVENOUS

## 2023-12-28 NOTE — Progress Notes (Addendum)
 Rounding Note   Patient Name: [name] Date of Encounter: 12/28/2023  [facility] Cardiologist: Diane Balding, MD   Subjective  Feels well  Scheduled Meds:  apixaban   5 mg Oral BID   buPROPion   150 mg Oral Daily   dapagliflozin  propanediol  10 mg Oral Daily   fluticasone  furoate-vilanterol  1 puff Inhalation Daily   insulin  aspart  0-15 Units Subcutaneous TID WC   losartan   25 mg Oral QHS   rosuvastatin   10 mg Oral Daily   torsemide   20 mg Oral Daily   Continuous Infusions:  PRN Meds: acetaminophen , ondansetron  (ZOFRAN ) IV   Vital Signs  Vitals:   12/27/23 2001 12/27/23 2343 12/28/23 0032 12/28/23 0418  BP: 108/68  (!) 101/50 112/68  Pulse: 72  (!) 59 66  Resp: 18 18 18 20   Temp: 98 F (36.7 C)  98 F (36.7 C) 97.8 F (36.6 C)  TempSrc: Oral  Oral Oral  SpO2: 97%  97% 99%  Weight: 85.2 kg     Height: 5' 6 (1.676 m)      No intake or output data in the 24 hours ending 12/28/23 0745    12/27/2023    8:01 PM 12/20/2023   10:00 AM 12/10/2023    8:26 AM  Last 3 Weights  Weight (lbs) 187 lb 12.8 oz 188 lb 1.6 oz 187 lb  Weight (kg) 85.186 kg 85.322 kg 84.823 kg      Telemetry Predominantly A paced rhythm with intermittent AFib ( ~100's) Personally Reviewed  ECG  AFib 119bpm, QTc difficult in AF wFVR read - Personally Reviewed  Physical Exam  GEN: No acute distress.   Neck: No JVD Cardiac: RRR, no murmurs, rubs, or gallops.  Respiratory: Clear to auscultation bilaterally. GI: Soft, nontender, non-distended  MS: No edema; No deformity. Neuro:  Nonfocal  Psych: Normal affect   Labs High Sensitivity Troponin:  No results for input(s): TROPONINIHS in the last 720 hours.   Chemistry Recent Labs  Lab 12/27/23 1950 12/28/23 0604  NA 135 137  K 3.8 4.2  CL 101 105  CO2 23 25  GLUCOSE 225* 159*  BUN 20 16  CREATININE 1.21* 1.23*  CALCIUM  8.2* 7.9*  MG 2.0 2.3  GFRNONAA 48* 47*  ANIONGAP 11 7    Lipids No results for  input(s): CHOL, TRIG, HDL, LABVLDL, LDLCALC, CHOLHDL in the last 168 hours.  Hematology Recent Labs  Lab 12/27/23 1950 12/28/23 0604  WBC 5.6 4.7  RBC 4.40 3.87  HGB 12.8 11.4*  HCT 40.7 35.9*  MCV 92.5 92.8  MCH 29.1 29.5  MCHC 31.4 31.8  RDW 13.2 13.3  PLT 230 194   Thyroid No results for input(s): TSH, FREET4 in the last 168 hours.  BNPNo results for input(s): BNP, PROBNP in the last 168 hours.  DDimer No results for input(s): DDIMER in the last 168 hours.   Radiology  No results found.  Cardiac Studies  ECHO 11/05/2023  1. Left ventricular ejection fraction, by estimation, is 45 to 50%. The  left ventricle has mildly decreased function. The left ventricle  demonstrates regional wall motion abnormalities with basal inferior,  inferolateral, and anterolateral severe  hypokinesis. There is mild concentric left ventricular hypertrophy. Left  ventricular diastolic parameters are consistent with Grade I diastolic  dysfunction (impaired relaxation).   2. Right ventricular systolic function is normal. The right ventricular  size is normal. There is normal pulmonary artery systolic pressure. The  estimated right ventricular  systolic pressure is 31.1 mmHg.   3. The mitral valve is normal in structure. Trivial mitral valve  regurgitation. No evidence of mitral stenosis.   4. The tricuspid valve is abnormal. Tricuspid valve regurgitation is  moderate.   5. The aortic valve is tricuspid. Aortic valve regurgitation is mild. No  aortic stenosis is present.   6. Aortic dilatation noted. There is borderline dilatation of the  ascending aorta, measuring 38 mm.   7. The inferior vena cava is normal in size with greater than 50%  respiratory variability, suggesting right atrial pressure of 3 mmHg.    TTE 09/19/2018  1. The left ventricle has normal systolic function, with an ejection fraction of 55-60%. The cavity size was normal. There is mild asymmetric left  ventricular hypertrophy. Left ventricular diastolic Doppler parameters are consistent with impaired  relaxation. No evidence of left ventricular regional wall motion abnormalities.  2. The average left ventricular global longitudinal strain is 14.9 %.  3. The right ventricle has normal systolic function. The cavity was normal. There is no increase in right ventricular wall thickness.  4. Left atrial size was mildly dilated.  5. The aortic valve is tricuspid. Mild sclerosis of the aortic valve. Aortic valve regurgitation is trivial by color flow Doppler.  Patient Profile   73 y.o. female  OSA w/CPAP, HTN, DM NICM, chronic CHF (systolic >> recovered LVEF by echo 2020) (No CAD by cath 2012) AFib ICD VT  Admitted for sotalol  load  Assessment & Plan   VT - Per Dr. Tyrone office note 9/8, no clear etiology for her increasingly frequent episodes of VT that have required repeated ICD intervention. EF was 45-50% in 10/2023.  - Transitioning from tikosyn  (last taken 9/12) to sotalol   Sotalol  load is in progress K+ 4.2 Mag 2.3 Creat 1.23 QTc OK  Calc CrCl today is 57 (generally in the 50's) This required daily dosing her sotalol  Will increase tonight's dose to 120mg  >> perhaps with a goal for 160 daily pending EKG     Paroxysmal AFib - CHA2DS2Vasc is 5, on eliquis  5 mg BID. Reports excellent compliance.     HTN - Resumed home meds on admission    OSA - Ordered CPAP   DM - Continue home farxiga  10 mg daily  - Ordered SSI and heart healthy/carb modified diet      For questions or updates, please contact [facility] Please consult [email or web address] for contact info under      Signed, Diane Macario Arthur, PA-C  12/28/2023, 7:45 AM    I have seen, examined the patient, and reviewed the above assessment and plan.    Interval: No acute overnight events. Patient reports feeling relatively well. No new or acute complaints.   General: Well developed, in no acute  distress.  Neck: No JVD.  Cardiac: Normal rate, regular rhythm.  Resp: Normal work of breathing.  Ext: No edema.  Neuro: No gross focal deficits.  Psych: Normal affect.   Cr: 1.23 K: 4.2  EKG: AF with QT  Assessment: [name] is a 73 year old female with a past medical history notable for chronic systolic heart failure s/p ICD and paroxysmal atrial fibrillation who was found to have increasing burden of VT on device checks. Tikosyn  was stopped and she presents today for sotalol  loading.  Problem List: Ventricular tachycardia Paroxysmal atrial fibrillation Secondary hypercoagulable state due to atrial fibrillation High risk medication use  Plan:  - Increase sotalol  to 120mg  once daily.  Will receive tonight. Every 24 hour dosing per pharmacy based on renal clearance. - Continue routine monitoring of kidney function and electrolytes, replete as necessary.  - Continue Eliquis  5mg  BID.  Fonda Kitty, MD 12/28/2023 9:49 PM

## 2023-12-28 NOTE — TOC CM/SW Note (Signed)
 Transition of Care [facility]) - Inpatient Brief Assessment   Patient Details  Name: [name] MRN: [id number] Date of Birth: 09/28/1950  [facility]) CM/SW Contact:    Sudie Erminio Deems, RN Phone Number: 12/28/2023, 11:02 AM   Clinical Narrative: Patient presented for atrial fib-plan to sotalol  load. Benefits check completed for sotalol -$0.00. PTA patient was from home with daughter. Patient has PCP and has no issues with transportation. Patient has DME rolling walker in the home as needed. No further home needs identified at this time.    Transition of Care Asessment: Insurance and Status: Insurance coverage has been reviewed Patient has primary care physician: Yes Home environment has been reviewed: reviewed Prior level of function:: independent Prior/Current Home Services: No current home services Social Drivers of Health Review: SDOH reviewed no interventions necessary Readmission risk has been reviewed: Yes Transition of care needs: no transition of care needs at this time

## 2023-12-28 NOTE — Plan of Care (Signed)
  Problem: Education: Goal: Knowledge of General Education information will improve Description: Including pain rating scale, medication(s)/side effects and non-pharmacologic comfort measures Outcome: Progressing   Problem: Health Behavior/Discharge Planning: Goal: Ability to manage health-related needs will improve Outcome: Progressing   Problem: Clinical Measurements: Goal: Ability to maintain clinical measurements within normal limits will improve Outcome: Progressing Goal: Will remain free from infection Outcome: Progressing   Problem: Activity: Goal: Risk for activity intolerance will decrease Outcome: Progressing   Problem: Nutrition: Goal: Adequate nutrition will be maintained Outcome: Progressing   Problem: Coping: Goal: Level of anxiety will decrease Outcome: Progressing   Problem: Pain Managment: Goal: General experience of comfort will improve and/or be controlled Outcome: Progressing   Problem: Safety: Goal: Ability to remain free from injury will improve Outcome: Progressing   Problem: Skin Integrity: Goal: Risk for impaired skin integrity will decrease Outcome: Progressing   Problem: Education: Goal: Knowledge of disease or condition will improve Outcome: Progressing Goal: Understanding of medication regimen will improve Outcome: Progressing   Problem: Activity: Goal: Ability to tolerate increased activity will improve Outcome: Progressing   Problem: Coping: Goal: Ability to adjust to condition or change in health will improve Outcome: Progressing   Problem: Fluid Volume: Goal: Ability to maintain a balanced intake and output will improve Outcome: Progressing   Problem: Metabolic: Goal: Ability to maintain appropriate glucose levels will improve Outcome: Progressing   Problem: Nutritional: Goal: Maintenance of adequate nutrition will improve Outcome: Progressing   Problem: Skin Integrity: Goal: Risk for impaired skin integrity will  decrease Outcome: Progressing

## 2023-12-28 NOTE — Telephone Encounter (Signed)
 Patient Product/process development scientist completed.    The patient is insured through [employer]. Patient has Medicare and is not eligible for a copay card, but may be able to apply for patient assistance or Medicare RX Payment Plan (Patient Must reach out to their plan, if eligible for payment plan), if available.    Ran test claim for sotalol  120 mg and the current 30 day co-pay is $0.00.   This test claim was processed through [facility]- copay amounts may vary at other pharmacies due to pharmacy/plan contracts, or as the patient moves through the different stages of their insurance plan.     [name], CPHT Pharmacy Technician III Certified Patient Advocate [facility] Patient Advocate Team Direct Number: [phone]  Fax: [phone]

## 2023-12-28 NOTE — Plan of Care (Signed)
  Problem: Education: Goal: Knowledge of General Education information will improve Description: Including pain rating scale, medication(s)/side effects and non-pharmacologic comfort measures Outcome: Progressing   Problem: Health Behavior/Discharge Planning: Goal: Ability to manage health-related needs will improve Outcome: Progressing   Problem: Clinical Measurements: Goal: Ability to maintain clinical measurements within normal limits will improve Outcome: Progressing   Problem: Activity: Goal: Risk for activity intolerance will decrease Outcome: Progressing   Problem: Nutrition: Goal: Adequate nutrition will be maintained Outcome: Progressing   Problem: Coping: Goal: Level of anxiety will decrease Outcome: Progressing   Problem: Clinical Measurements: Goal: Cardiovascular complication will be avoided Outcome: Not Met (add Reason) Note: On-going

## 2023-12-29 ENCOUNTER — Other Ambulatory Visit: Payer: Self-pay

## 2023-12-29 DIAGNOSIS — I480 Paroxysmal atrial fibrillation: Secondary | ICD-10-CM | POA: Diagnosis not present

## 2023-12-29 DIAGNOSIS — D6869 Other thrombophilia: Secondary | ICD-10-CM | POA: Diagnosis not present

## 2023-12-29 DIAGNOSIS — I472 Ventricular tachycardia, unspecified: Secondary | ICD-10-CM | POA: Diagnosis not present

## 2023-12-29 DIAGNOSIS — I5022 Chronic systolic (congestive) heart failure: Secondary | ICD-10-CM | POA: Diagnosis not present

## 2023-12-29 LAB — GLUCOSE, CAPILLARY
Glucose-Capillary: 132 mg/dL — ABNORMAL HIGH (ref 70–99)
Glucose-Capillary: 150 mg/dL — ABNORMAL HIGH (ref 70–99)
Glucose-Capillary: 188 mg/dL — ABNORMAL HIGH (ref 70–99)
Glucose-Capillary: 140 mg/dL — ABNORMAL HIGH (ref 70–99)

## 2023-12-29 LAB — BASIC METABOLIC PANEL WITH GFR
Anion gap: 16 — ABNORMAL HIGH (ref 5–15)
BUN: 15 mg/dL (ref 8–23)
CO2: 21 mmol/L — ABNORMAL LOW (ref 22–32)
Calcium: 8.4 mg/dL — ABNORMAL LOW (ref 8.9–10.3)
Chloride: 101 mmol/L (ref 98–111)
Creatinine, Ser: 1.3 mg/dL — ABNORMAL HIGH (ref 0.44–1.00)
GFR, Estimated: 44 mL/min — ABNORMAL LOW (ref >60)
Glucose, Bld: 140 mg/dL — ABNORMAL HIGH (ref 70–99)
Potassium: 4.2 mmol/L (ref 3.5–5.1)
Sodium: 138 mmol/L (ref 135–145)

## 2023-12-29 LAB — MAGNESIUM: Magnesium: 2.2 mg/dL (ref 1.7–2.4)

## 2023-12-29 MED ORDER — SOTALOL HCL 80 MG PO TABS
160.0000 mg | ORAL_TABLET | Freq: Every day | ORAL | Status: DC
  Administered 2023-12-29 – 2023-12-30 (×2): 160 mg via ORAL
  Filled 2023-12-29 (×2): qty 2

## 2023-12-29 NOTE — Progress Notes (Signed)
 PT on home CPAP at this time.   12/29/23 2230  BiPAP/CPAP/SIPAP  BiPAP/CPAP/SIPAP Pt Type Adult  BiPAP/CPAP/SIPAP DREAMSTATIOND  Mask Type Nasal pillows  Dentures removed? Not applicable  Mask Size Small  Set Rate 0 breaths/min  Respiratory Rate 18 breaths/min  EPAP 5 cmH2O  PEEP 5 cmH20  FiO2 (%) 21 %  Flow Rate 0 lpm  Patient Home Machine No  Patient Home Mask Yes  Patient Home Tubing No  Auto Titrate No  Nasal massage performed Yes  CPAP/SIPAP surface wiped down Yes  Device Plugged into RED Power Outlet Yes

## 2023-12-29 NOTE — Progress Notes (Signed)
 Pharmacy Consult for Sotalol  Electrolyte Replacement- Follow Up  Pharmacy consulted to assist in monitoring and replacing electrolytes in this 73 y.o. female admitted on 12/27/2023 undergoing sotalol  initiation . Labs:    Component Value Date/Time   K 4.2 12/29/2023 0527   MG 2.2 12/29/2023 9472     Plan: Potassium: K >/= 4: No additional supplementation needed  Magnesium : Mg > 2: No additional supplementation needed   Thank you for allowing pharmacy to participate in this patient's care   Prentice Poisson, PharmD Clinical Pharmacist **Pharmacist phone directory can now be found on [email or web address] ([email or web address]).  Listed under [facility].

## 2023-12-29 NOTE — Progress Notes (Addendum)
 Rounding Note   Patient Name: [name] Date of Encounter: 12/29/2023  [name] Cardiologist: Jerel Balding, MD   Subjective  Tired this morning, otherwise feels well  Scheduled Meds:  apixaban   5 mg Oral BID   buPROPion   150 mg Oral Daily   dapagliflozin  propanediol  10 mg Oral Daily   fluticasone  furoate-vilanterol  1 puff Inhalation Daily   insulin  aspart  0-15 Units Subcutaneous TID WC   losartan   25 mg Oral QHS   rosuvastatin   10 mg Oral Daily   sodium chloride  flush  3 mL Intravenous Q12H   sotalol   120 mg Oral QHS   torsemide   20 mg Oral Daily   Continuous Infusions:  sodium chloride      PRN Meds: sodium chloride , acetaminophen , ondansetron  (ZOFRAN ) IV, sodium chloride  flush   Vital Signs  Vitals:   12/28/23 1955 12/28/23 2325 12/29/23 0537 12/29/23 0715  BP: 119/63 103/66 (!) 99/55   Pulse: 71 64 66   Resp: 16 18 18 17   Temp: 98.1 F (36.7 C) 98 F (36.7 C) 98.6 F (37 C) 98.5 F (36.9 C)  TempSrc: Oral Oral Oral Oral  SpO2: 100% 98% 97%   Weight:      Height:        Intake/Output Summary (Last 24 hours) at 12/29/2023 0756 Last data filed at 12/28/2023 0925 Gross per 24 hour  Intake 360 ml  Output --  Net 360 ml      12/27/2023    8:01 PM 12/20/2023   10:00 AM 12/10/2023    8:26 AM  Last 3 Weights  Weight (lbs) 187 lb 12.8 oz 188 lb 1.6 oz 187 lb  Weight (kg) 85.186 kg 85.322 kg 84.823 kg      Telemetry A paced/V sensed Personally Reviewed  ECG  A paced/V sensed 63bpm, Qtc  - Personally Reviewed  Physical Exam  GEN: No acute distress.   Neck: No JVD Cardiac: RRR, no murmurs, rubs, or gallops.  Respiratory: CTA b/l GI: Soft, nontender, non-distended  MS: No edema; No deformity. Neuro:  Nonfocal  Psych: Normal affect   Labs High Sensitivity Troponin:  No results for input(s): TROPONINIHS in the last 720 hours.   Chemistry Recent Labs  Lab 12/27/23 1950 12/28/23 0604 12/29/23 0527  NA 135 137 138  K  3.8 4.2 4.2  CL 101 105 101  CO2 23 25 21*  GLUCOSE 225* 159* 140*  BUN 20 16 15   CREATININE 1.21* 1.23* 1.30*  CALCIUM  8.2* 7.9* 8.4*  MG 2.0 2.3 2.2  GFRNONAA 48* 47* 44*  ANIONGAP 11 7 16*    Lipids No results for input(s): CHOL, TRIG, HDL, LABVLDL, LDLCALC, CHOLHDL in the last 168 hours.  Hematology Recent Labs  Lab 12/27/23 1950 12/28/23 0604  WBC 5.6 4.7  RBC 4.40 3.87  HGB 12.8 11.4*  HCT 40.7 35.9*  MCV 92.5 92.8  MCH 29.1 29.5  MCHC 31.4 31.8  RDW 13.2 13.3  PLT 230 194   Thyroid No results for input(s): TSH, FREET4 in the last 168 hours.  BNPNo results for input(s): BNP, PROBNP in the last 168 hours.  DDimer No results for input(s): DDIMER in the last 168 hours.   Radiology  No results found.  Cardiac Studies  ECHO 11/05/2023  1. Left ventricular ejection fraction, by estimation, is 45 to 50%. The  left ventricle has mildly decreased function. The left ventricle  demonstrates regional wall motion abnormalities with basal inferior,  inferolateral, and  anterolateral severe  hypokinesis. There is mild concentric left ventricular hypertrophy. Left  ventricular diastolic parameters are consistent with Grade I diastolic  dysfunction (impaired relaxation).   2. Right ventricular systolic function is normal. The right ventricular  size is normal. There is normal pulmonary artery systolic pressure. The  estimated right ventricular systolic pressure is 31.1 mmHg.   3. The mitral valve is normal in structure. Trivial mitral valve  regurgitation. No evidence of mitral stenosis.   4. The tricuspid valve is abnormal. Tricuspid valve regurgitation is  moderate.   5. The aortic valve is tricuspid. Aortic valve regurgitation is mild. No  aortic stenosis is present.   6. Aortic dilatation noted. There is borderline dilatation of the  ascending aorta, measuring 38 mm.   7. The inferior vena cava is normal in size with greater than 50%  respiratory  variability, suggesting right atrial pressure of 3 mmHg.    TTE 09/19/2018  1. The left ventricle has normal systolic function, with an ejection fraction of 55-60%. The cavity size was normal. There is mild asymmetric left ventricular hypertrophy. Left ventricular diastolic Doppler parameters are consistent with impaired  relaxation. No evidence of left ventricular regional wall motion abnormalities.  2. The average left ventricular global longitudinal strain is 14.9 %.  3. The right ventricle has normal systolic function. The cavity was normal. There is no increase in right ventricular wall thickness.  4. Left atrial size was mildly dilated.  5. The aortic valve is tricuspid. Mild sclerosis of the aortic valve. Aortic valve regurgitation is trivial by color flow Doppler.  Patient Profile   73 y.o. female  OSA w/CPAP, HTN, DM NICM, chronic CHF (systolic >> recovered LVEF by echo 2020) (No CAD by cath 2012) AFib ICD VT  Admitted for sotalol  load  Assessment & Plan   VT - Per Dr. Tyrone office note 9/8, no clear etiology for her increasingly frequent episodes of VT that have required repeated ICD intervention. EF was 45-50% in 10/2023.  - Transitioning from tikosyn  (last taken 9/12) to sotalol   Sotalol  load is in progress K+ 4.2 Mag 2.2 Creat 1.30 (stable) QTc is OK  Calc CrCl requires daily dosing her sotalol  Will increase tonight's dose to 120mg  >> will go to 160mg  tonight   Paroxysmal AFib - CHA2DS2Vasc is 5, on eliquis  5 mg BID. Reports excellent compliance.  Maintaining SR, do not anticipate need for DCCV > though will hold NPO after MN    HTN - home meds   OSA - Ordered CPAP   DM -  home farxiga  10 mg daily  - Ordered SSI and heart healthy/carb modified diet      For questions or updates, please contact [facility] Please consult [email or web address] for contact info under      Signed, Charlies Macario Arthur, PA-C  12/29/2023, 7:56 AM    I have seen,  examined the patient, and reviewed the above assessment and plan.    Interval: No acute overnight events. Patient reports feeling relatively well. No new or acute complaints.   General: Well developed, in no acute distress.  Neck: No JVD.  Cardiac: Normal rate, regular rhythm.  Resp: Normal work of breathing.  Ext: No edema.  Neuro: No gross focal deficits.  Psych: Normal affect.   Cr: 1.30 K: 4.2  EKG: Atrial paced rhythm, QT  Assessment:  [name] is a 73 year old female with a past medical history notable for chronic systolic heart failure s/p ICD  and paroxysmal atrial fibrillation who was found to have increasing burden of VT on device checks. Tikosyn  was stopped and she presents today for sotalol  loading.   Problem List: Ventricular tachycardia Paroxysmal atrial fibrillation Secondary hypercoagulable state due to atrial fibrillation High risk medication use   Plan:  - Increase sotalol  to 160mg  once daily. Will receive tonight. Every 24 hour dosing per pharmacy based on renal clearance. - Continue routine monitoring of kidney function and electrolytes, replete as necessary.  - Continue Eliquis  5mg  BID.  Fonda Kitty, MD 12/29/2023 10:27 PM

## 2023-12-30 ENCOUNTER — Encounter (HOSPITAL_COMMUNITY)
Admission: RE | Disposition: A | Discharge status: Home / Self Care | Payer: Self-pay | Source: Ambulatory Visit | Attending: Cardiology

## 2023-12-30 DIAGNOSIS — I472 Ventricular tachycardia, unspecified: Secondary | ICD-10-CM | POA: Diagnosis not present

## 2023-12-30 DIAGNOSIS — D6869 Other thrombophilia: Secondary | ICD-10-CM | POA: Diagnosis not present

## 2023-12-30 DIAGNOSIS — I480 Paroxysmal atrial fibrillation: Secondary | ICD-10-CM | POA: Diagnosis not present

## 2023-12-30 DIAGNOSIS — I5022 Chronic systolic (congestive) heart failure: Secondary | ICD-10-CM | POA: Diagnosis not present

## 2023-12-30 LAB — BASIC METABOLIC PANEL WITH GFR
Anion gap: 13 (ref 5–15)
BUN: 21 mg/dL (ref 8–23)
CO2: 25 mmol/L (ref 22–32)
Calcium: 8.7 mg/dL — ABNORMAL LOW (ref 8.9–10.3)
Chloride: 99 mmol/L (ref 98–111)
Creatinine, Ser: 1.25 mg/dL — ABNORMAL HIGH (ref 0.44–1.00)
GFR, Estimated: 46 mL/min — ABNORMAL LOW (ref >60)
Glucose, Bld: 136 mg/dL — ABNORMAL HIGH (ref 70–99)
Potassium: 4.2 mmol/L (ref 3.5–5.1)
Sodium: 137 mmol/L (ref 135–145)

## 2023-12-30 LAB — GLUCOSE, CAPILLARY
Glucose-Capillary: 108 mg/dL — ABNORMAL HIGH (ref 70–99)
Glucose-Capillary: 254 mg/dL — ABNORMAL HIGH (ref 70–99)
Glucose-Capillary: 163 mg/dL — ABNORMAL HIGH (ref 70–99)
Glucose-Capillary: 234 mg/dL — ABNORMAL HIGH (ref 70–99)

## 2023-12-30 LAB — MAGNESIUM: Magnesium: 2.1 mg/dL (ref 1.7–2.4)

## 2023-12-30 SURGERY — CARDIOVERSION (CATH LAB)
Anesthesia: General

## 2023-12-30 NOTE — Progress Notes (Addendum)
 Rounding Note   Patient Name: [name] Date of Encounter: 12/30/2023  [facility] Cardiologist: Jerel Balding, MD   Subjective  Woke last night with a bad headache, unusual for her, tylenol  resolved it, had a bad night otherwise feels well  Scheduled Meds:  apixaban   5 mg Oral BID   buPROPion   150 mg Oral Daily   dapagliflozin  propanediol  10 mg Oral Daily   fluticasone  furoate-vilanterol  1 puff Inhalation Daily   insulin  aspart  0-15 Units Subcutaneous TID WC   losartan   25 mg Oral QHS   rosuvastatin   10 mg Oral Daily   sodium chloride  flush  3 mL Intravenous Q12H   sotalol   160 mg Oral QHS   torsemide   20 mg Oral Daily   Continuous Infusions:   PRN Meds: acetaminophen , ondansetron  (ZOFRAN ) IV, sodium chloride  flush   Vital Signs  Vitals:   12/29/23 2042 12/29/23 2300 12/30/23 0326 12/30/23 0758  BP: 128/67 122/64 100/62 105/70  Pulse: 72 66 67 76  Resp:  16 20 16   Temp:  97.9 F (36.6 C) 98 F (36.7 C) 98.9 F (37.2 C)  TempSrc:  Oral Oral Oral  SpO2: 100% 90%  95%  Weight:      Height:        Intake/Output Summary (Last 24 hours) at 12/30/2023 0826 Last data filed at 12/29/2023 2042 Gross per 24 hour  Intake 240 ml  Output --  Net 240 ml      12/27/2023    8:01 PM 12/20/2023   10:00 AM 12/10/2023    8:26 AM  Last 3 Weights  Weight (lbs) 187 lb 12.8 oz 188 lb 1.6 oz 187 lb  Weight (kg) 85.186 kg 85.322 kg 84.823 kg      Telemetry A paced/V sensed, infrequent PVCs Personally Reviewed  ECG  A paced/V sensed 64bpm, QTc  - Personally Reviewed  Physical Exam  Unchanged exam GEN: No acute distress.   Neck: No JVD Cardiac: RRR, no murmurs, rubs, or gallops.  Respiratory: CTA b/l GI: Soft, nontender, non-distended  MS: No edema; No deformity. Neuro:  Nonfocal  Psych: Normal affect   Labs High Sensitivity Troponin:  No results for input(s): TROPONINIHS in the last 720 hours.   Chemistry Recent Labs  Lab  12/28/23 0604 12/29/23 0527 12/30/23 0454  NA 137 138 137  K 4.2 4.2 4.2  CL 105 101 99  CO2 25 21* 25  GLUCOSE 159* 140* 136*  BUN 16 15 21   CREATININE 1.23* 1.30* 1.25*  CALCIUM  7.9* 8.4* 8.7*  MG 2.3 2.2 2.1  GFRNONAA 47* 44* 46*  ANIONGAP 7 16* 13    Lipids No results for input(s): CHOL, TRIG, HDL, LABVLDL, LDLCALC, CHOLHDL in the last 168 hours.  Hematology Recent Labs  Lab 12/27/23 1950 12/28/23 0604  WBC 5.6 4.7  RBC 4.40 3.87  HGB 12.8 11.4*  HCT 40.7 35.9*  MCV 92.5 92.8  MCH 29.1 29.5  MCHC 31.4 31.8  RDW 13.2 13.3  PLT 230 194   Thyroid No results for input(s): TSH, FREET4 in the last 168 hours.  BNPNo results for input(s): BNP, PROBNP in the last 168 hours.  DDimer No results for input(s): DDIMER in the last 168 hours.   Radiology  No results found.  Cardiac Studies  ECHO 11/05/2023  1. Left ventricular ejection fraction, by estimation, is 45 to 50%. The  left ventricle has mildly decreased function. The left ventricle  demonstrates regional wall motion  abnormalities with basal inferior,  inferolateral, and anterolateral severe  hypokinesis. There is mild concentric left ventricular hypertrophy. Left  ventricular diastolic parameters are consistent with Grade I diastolic  dysfunction (impaired relaxation).   2. Right ventricular systolic function is normal. The right ventricular  size is normal. There is normal pulmonary artery systolic pressure. The  estimated right ventricular systolic pressure is 31.1 mmHg.   3. The mitral valve is normal in structure. Trivial mitral valve  regurgitation. No evidence of mitral stenosis.   4. The tricuspid valve is abnormal. Tricuspid valve regurgitation is  moderate.   5. The aortic valve is tricuspid. Aortic valve regurgitation is mild. No  aortic stenosis is present.   6. Aortic dilatation noted. There is borderline dilatation of the  ascending aorta, measuring 38 mm.   7. The  inferior vena cava is normal in size with greater than 50%  respiratory variability, suggesting right atrial pressure of 3 mmHg.    TTE 09/19/2018  1. The left ventricle has normal systolic function, with an ejection fraction of 55-60%. The cavity size was normal. There is mild asymmetric left ventricular hypertrophy. Left ventricular diastolic Doppler parameters are consistent with impaired  relaxation. No evidence of left ventricular regional wall motion abnormalities.  2. The average left ventricular global longitudinal strain is 14.9 %.  3. The right ventricle has normal systolic function. The cavity was normal. There is no increase in right ventricular wall thickness.  4. Left atrial size was mildly dilated.  5. The aortic valve is tricuspid. Mild sclerosis of the aortic valve. Aortic valve regurgitation is trivial by color flow Doppler.  Patient Profile   73 y.o. female  OSA w/CPAP, HTN, DM NICM, chronic CHF (systolic >> recovered LVEF by echo 2020) (No CAD by cath 2012) AFib ICD VT  Admitted for sotalol  load  Assessment & Plan   VT - Per Dr. Tyrone office note 9/8, no clear etiology for her increasingly frequent episodes of VT that have required repeated ICD intervention. EF was 45-50% in 10/2023.  - Transitioning from tikosyn  (last taken 9/12) to sotalol   Sotalol  load is in progress K+ 4.2 Mag 2.1 Creat 1.25 (stable) QTc is OK  Calc CrCl requires daily dosing her sotalol  Continue sotalol  for this evening >> ? I higher dose gave her a headache?, suspect no change tonight, though if she gets another headache, may need to reduce dose, she did not get a HA w/120mg   Anticipate discharge tomorrow   Paroxysmal AFib - CHA2DS2Vasc is 5, on eliquis  5 mg BID. Reports excellent compliance.  Maintaining SR, do not anticipate need for DCCV > though will hold NPO after MN    HTN - home meds   OSA - Ordered CPAP   DM -  home farxiga  10 mg daily  - Ordered SSI and  heart healthy/carb modified diet      For questions or updates, please contact [facility] Please consult [email or web address] for contact info under      Signed, Charlies Macario Arthur, PA-C  12/30/2023, 8:26 AM    I have seen, examined the patient, and reviewed the above assessment and plan.    Interval: No acute overnight events. Patient did report headache overnight which limited her sleep.   General: Well developed, in no acute distress.  Neck: No JVD.  Cardiac: Normal rate, regular rhythm.  Resp: Normal work of breathing.  Ext: No edema.  Neuro: No gross focal deficits.  Psych: Normal affect.   Cr: 1.25  K: 4.2   EKG: Atrial paced rhythm, QT stable   Assessment:  Ms. Reighlyn Elmes is a 73 year old female with a past medical history notable for chronic systolic heart failure s/p ICD and paroxysmal atrial fibrillation who was found to have increasing burden of VT on device checks. Tikosyn  was stopped and she presents today for sotalol  loading.   Problem List: Ventricular tachycardia Paroxysmal atrial fibrillation Secondary hypercoagulable state due to atrial fibrillation High risk medication use   Plan:  - Continue sotalol  160mg  once daily. Will receive tonight. Every 24 hour dosing per pharmacy based on renal clearance. - Continue routine monitoring of kidney function and electrolytes, replete as necessary.  - Continue Eliquis  5mg  BID.  Fonda Kitty, MD 12/30/2023 10:46 PM

## 2023-12-30 NOTE — Progress Notes (Signed)
 Patient had 5 beats VT at 1811.  Daphne Barrack NP notified, no new orders received.

## 2023-12-30 NOTE — Progress Notes (Signed)
 Pharmacy: Dofetilide  (Tikosyn ) - Follow Up Assessment and Electrolyte Replacement  Pharmacy consulted to assist in monitoring and replacing electrolytes in this 73 y.o. female admitted on 12/27/2023 undergoing dofetilide  initiation. Labs:    Component Value Date/Time   K 4.2 12/30/2023 0454   MG 2.1 12/30/2023 0454     Plan: Potassium: K >/= 4: No additional supplementation needed  Magnesium : Mg > 2: No additional supplementation needed   As patient has required only a total of 40 mEq of potassium since 9/15, recommend no potassium replacement at discharge  Thank you for allowing pharmacy to participate in this patient's care    Prentice Poisson, PharmD Clinical Pharmacist **Pharmacist phone directory can now be found on [email or web address] ([email or web address]).  Listed under [facility].

## 2023-12-30 NOTE — Plan of Care (Signed)
  Problem: Education: Goal: Knowledge of General Education information will improve Description: Including pain rating scale, medication(s)/side effects and non-pharmacologic comfort measures Outcome: Progressing   Problem: Health Behavior/Discharge Planning: Goal: Ability to manage health-related needs will improve Outcome: Progressing   Problem: Clinical Measurements: Goal: Ability to maintain clinical measurements within normal limits will improve Outcome: Progressing Goal: Will remain free from infection Outcome: Progressing Goal: Diagnostic test results will improve Outcome: Progressing Goal: Respiratory complications will improve Outcome: Progressing Goal: Cardiovascular complication will be avoided Outcome: Progressing   Problem: Activity: Goal: Risk for activity intolerance will decrease Outcome: Progressing   Problem: Nutrition: Goal: Adequate nutrition will be maintained Outcome: Progressing   Problem: Coping: Goal: Level of anxiety will decrease Outcome: Progressing   Problem: Elimination: Goal: Will not experience complications related to bowel motility Outcome: Progressing Goal: Will not experience complications related to urinary retention Outcome: Progressing   Problem: Pain Managment: Goal: General experience of comfort will improve and/or be controlled Outcome: Progressing   Problem: Safety: Goal: Ability to remain free from injury will improve Outcome: Progressing   Problem: Skin Integrity: Goal: Risk for impaired skin integrity will decrease Outcome: Progressing   Problem: Education: Goal: Knowledge of disease or condition will improve Outcome: Progressing Goal: Understanding of medication regimen will improve Outcome: Progressing Goal: Individualized Educational Video(s) Outcome: Progressing   Problem: Activity: Goal: Ability to tolerate increased activity will improve Outcome: Progressing   Problem: Cardiac: Goal: Ability to achieve  and maintain adequate cardiopulmonary perfusion will improve Outcome: Progressing   Problem: Health Behavior/Discharge Planning: Goal: Ability to safely manage health-related needs after discharge will improve Outcome: Progressing   Problem: Education: Goal: Ability to describe self-care measures that may prevent or decrease complications (Diabetes Survival Skills Education) will improve Outcome: Progressing Goal: Individualized Educational Video(s) Outcome: Progressing   Problem: Coping: Goal: Ability to adjust to condition or change in health will improve Outcome: Progressing   Problem: Fluid Volume: Goal: Ability to maintain a balanced intake and output will improve Outcome: Progressing   Problem: Health Behavior/Discharge Planning: Goal: Ability to identify and utilize available resources and services will improve Outcome: Progressing Goal: Ability to manage health-related needs will improve Outcome: Progressing   Problem: Metabolic: Goal: Ability to maintain appropriate glucose levels will improve Outcome: Progressing   Problem: Nutritional: Goal: Maintenance of adequate nutrition will improve Outcome: Progressing Goal: Progress toward achieving an optimal weight will improve Outcome: Progressing   Problem: Skin Integrity: Goal: Risk for impaired skin integrity will decrease Outcome: Progressing   Problem: Tissue Perfusion: Goal: Adequacy of tissue perfusion will improve Outcome: Progressing

## 2023-12-31 ENCOUNTER — Other Ambulatory Visit: Payer: Self-pay

## 2023-12-31 ENCOUNTER — Other Ambulatory Visit (HOSPITAL_COMMUNITY): Payer: Self-pay

## 2023-12-31 LAB — BASIC METABOLIC PANEL WITH GFR
Anion gap: 12 (ref 5–15)
BUN: 23 mg/dL (ref 8–23)
CO2: 25 mmol/L (ref 22–32)
Calcium: 8.7 mg/dL — ABNORMAL LOW (ref 8.9–10.3)
Chloride: 100 mmol/L (ref 98–111)
Creatinine, Ser: 1.13 mg/dL — ABNORMAL HIGH (ref 0.44–1.00)
GFR, Estimated: 52 mL/min — ABNORMAL LOW (ref >60)
Glucose, Bld: 163 mg/dL — ABNORMAL HIGH (ref 70–99)
Potassium: 4.4 mmol/L (ref 3.5–5.1)
Sodium: 137 mmol/L (ref 135–145)

## 2023-12-31 LAB — MAGNESIUM: Magnesium: 2.1 mg/dL (ref 1.7–2.4)

## 2023-12-31 LAB — GLUCOSE, CAPILLARY: Glucose-Capillary: 136 mg/dL — ABNORMAL HIGH (ref 70–99)

## 2023-12-31 MED ORDER — SOTALOL HCL 80 MG PO TABS
160.0000 mg | ORAL_TABLET | Freq: Every day | ORAL | 5 refills | Status: DC
  Filled 2023-12-31 – 2024-01-24 (×3): qty 60, 30d supply, fill #0
  Filled 2024-02-18: qty 60, 30d supply, fill #1

## 2023-12-31 MED ORDER — ROSUVASTATIN CALCIUM 10 MG PO TABS: 10.0000 mg | ORAL_TABLET | Freq: Every day | ORAL | Status: DC

## 2023-12-31 MED ORDER — DICLOFENAC SODIUM 1 % EX GEL: 1.0000 | Freq: Every day | CUTANEOUS | Status: AC | PRN

## 2023-12-31 MED ORDER — FLUTICASONE PROPIONATE 50 MCG/ACT NA SUSP: 1.0000 | Freq: Every day | NASAL | Status: AC | PRN

## 2023-12-31 NOTE — Progress Notes (Signed)
 Pharmacy Consult for Sotalol  Electrolyte Replacement- Follow Up  Pharmacy consulted to assist in monitoring and replacing electrolytes in this 73 y.o. female admitted on 12/27/2023 undergoing sotalol  initiation .  Labs:    Component Value Date/Time   K 4.4 12/31/2023 0456   MG 2.1 12/31/2023 0456     Plan: Potassium: K >/= 4: No additional supplementation needed  Magnesium : Mg > 2: No additional supplementation needed   As patient has required only a total of 40 mEq of potassium since 9/15, recommend no potassium replacement at discharge   Thank you for allowing pharmacy to participate in this patient's care   Prentice Poisson, PharmD Clinical Pharmacist **Pharmacist phone directory can now be found on [email or web address] ([email or web address]).  Listed under [facility].

## 2023-12-31 NOTE — Care Management Important Message (Signed)
 Important Message  Patient Details  Name: [name] MRN: [id number] Date of Birth: 1950/04/28   Important Message Given:  Yes - Medicare IM     Vonzell Arrie Sharps 12/31/2023, 9:45 AM

## 2023-12-31 NOTE — Discharge Summary (Signed)
 ELECTROPHYSIOLOGY PROCEDURE DISCHARGE SUMMARY    Patient ID: [name],  MRN: [id number], DOB/AGE: 10/30/1950 73 y.o.  Admit date: 12/27/2023 Discharge date: 12/31/2023  Primary Care Physician: Romelle Booty, MD  Primary Cardiologist: Dr. Francyne Electrophysiologist: Dr. Kennyth  Primary Discharge Diagnosis:  1.  VT S/p sotalol  load  Secondary Discharge Diagnosis:  NICM Improved LVEF HTN OSA W/CPAP DM2 Paroxysmal Afib CHA2DS2Vasc is 5, on Eliquis  6.   ICD  Allergies  Allergen Reactions   Avelox [Moxifloxacin Hcl In Nacl] Other (See Comments)    Cardiac arrest per pt   Nsaids Other (See Comments)    Cardiac arrest per pt    Simvastatin Other (See Comments)    Muscle pain   Ace Inhibitors Other (See Comments) and Cough   Jardiance  [Empagliflozin ] Other (See Comments)    Felt crazy, fatigue, sweating, denies hypoglycemia while taking   Latex Rash     Procedures This Admission:  1.  Sotalol  loading   Brief HPI: [name] is a 73 y.o. female with a past medical history as noted above.  Followed by EP and Dr. Francyne.  With recurrent MMVT and device therapies, recommended off tikosyn  and transition to Sotalol .    Tiksoyn was still on her list of medications at arrival, though she confirmed she had stopped it 3 at home as she was instructed to.  Hospital Course:  The patient was admitted and sotalol  was initiated.  Renal function and electrolytes were followed during the hospitalization.  Given her creatinine clearance, required renal dosing to daily.  Initiated 80mg  > 120mg  and now on 160mg  with stable QT.  Initially developed a post dose headache though none last night, she feels very well this morning.  The patient's QTc remained stable.  She was monitored until discharge on telemetry which demonstrated AP paced/V sensed rhythm occ PVCs.  On the day of discharge, she was examined by Dr Almetta who considered the patient stable for discharge to home.   Follow-up has been arranged with the EP team in 1 week  No new or additional electrolyte replacement for home  Physical Exam: Vitals:   12/30/23 2007 12/30/23 2327 12/31/23 0409 12/31/23 0728  BP: (!) 136/93 99/64 133/75 109/65  Pulse: 72 68 79 68  Resp: 18 18 18 16   Temp: 98 F (36.7 C) 98 F (36.7 C) 98 F (36.7 C) 98.5 F (36.9 C)  TempSrc: Oral Oral Oral Oral  SpO2: 97% 97% 98% 98%  Weight:      Height:         GEN- The patient is well appearing, alert and oriented x 3 today.   HEENT: normocephalic, atraumatic; sclera clear, conjunctiva pink; hearing intact; oropharynx clear; neck supple, no JVP Lymph- no cervical lymphadenopathy Lungs- CTA b/l, normal work of breathing.  No wheezes, rales, rhonchi Heart- RRR, no murmurs, rubs or gallops, PMI not laterally displaced GI- soft, non-tender, non-distended Extremities- no clubbing, cyanosis, or edema MS- no significant deformity or atrophy Skin- warm and dry, no rash or lesion Psych- euthymic mood, full affect Neuro- strength and sensation are intact   Labs:   Lab Results  Component Value Date   WBC 4.7 12/28/2023   HGB 11.4 (L) 12/28/2023   HCT 35.9 (L) 12/28/2023   MCV 92.8 12/28/2023   PLT 194 12/28/2023    Recent Labs  Lab 12/31/23 0456  NA 137  K 4.4  CL 100  CO2 25  BUN 23  CREATININE 1.13*  CALCIUM  8.7*  GLUCOSE 163*     Discharge Medications:  Allergies as of 12/31/2023       Reactions   Avelox [moxifloxacin Hcl In Nacl] Other (See Comments)   Cardiac arrest per pt   Nsaids Other (See Comments)   Cardiac arrest per pt   Simvastatin Other (See Comments)   Muscle pain   Ace Inhibitors Other (See Comments), Cough   Jardiance  [empagliflozin ] Other (See Comments)   Felt crazy, fatigue, sweating, denies hypoglycemia while taking   Latex Rash        Medication List     STOP taking these medications    dofetilide  250 MCG capsule Commonly known as: TIKOSYN        TAKE these  medications    acetaminophen  650 MG CR tablet Commonly known as: Tylenol  8 Hour Take 1 tablet (650 mg total) by mouth every 8 (eight) hours as needed for pain. What changed:  how much to take when to take this   bisoprolol  10 MG tablet Commonly known as: ZEBETA  Take 1 tablet (10 mg total) by mouth in the morning and at bedtime.   budesonide -formoterol  80-4.5 MCG/ACT inhaler Commonly known as: SYMBICORT  INHALE 2 PUFFS INTO THE LUNGS IN THE MORNING AND AT BEDTIME What changed:  how much to take how to take this when to take this reasons to take this additional instructions   buPROPion  150 MG 24 hr tablet Commonly known as: WELLBUTRIN  XL Take 1 tablet (150 mg total) by mouth daily.   diclofenac  Sodium 1 % Gel Commonly known as: Voltaren  Apply 2 g topically 4 (four) times daily. What changed:  how much to take when to take this reasons to take this   Eliquis  5 MG Tabs tablet Generic drug: apixaban  Take 1 tablet (5 mg total) by mouth 2 (two) times daily.   Embecta Pen Needle Nano 2 Gen 32G X 4 MM Misc Generic drug: Insulin  Pen Needle Use as directed with insulin  once daily   famotidine  20 MG tablet Commonly known as: PEPCID  Take 1 tablet (20 mg total) by mouth daily.   Farxiga  10 MG Tabs tablet Generic drug: dapagliflozin  propanediol Take 1 tablet (10 mg total) by mouth daily.   fluticasone  50 MCG/ACT nasal spray Commonly known as: FLONASE  instill 1 spray into each nostril twice a day What changed:  how much to take how to take this when to take this reasons to take this additional instructions   glucose blood test strip Use as instructed   levalbuterol  45 MCG/ACT inhaler Commonly known as: Xopenex  HFA Inhale 1-2 puffs into the lungs every 4 (four) hours as needed for wheezing   losartan  25 MG tablet Commonly known as: COZAAR  Take 1 tablet (25 mg total) by mouth at bedtime.   montelukast  10 MG tablet Commonly known as: SINGULAIR  Take 1 tablet (10  mg) by mouth at bedtime.   Ozempic  (2 MG/DOSE) 8 MG/3ML Sopn Generic drug: Semaglutide  (2 MG/DOSE) Inject 2 mg into the skin once a week.   potassium chloride  10 MEQ tablet Commonly known as: KLOR-CON  M Take 1 tablet (10 mEq total) by mouth daily as directed   Prodigy Lancets 28G Misc 1 Units by Does not apply route 4 (four) times daily. What changed: when to take this   rosuvastatin  10 MG tablet Commonly known as: CRESTOR  Take 1 tablet (10 mg total) by mouth daily. What changed: when to take this   sotalol  160 MG tablet Commonly known as: BETAPACE  Take 1 tablet (160  mg total) by mouth at bedtime.   torsemide  20 MG tablet Commonly known as: DEMADEX  Take 1 tablet (20 mg total) by mouth daily.   Tresiba  FlexTouch 100 UNIT/ML FlexTouch Pen Generic drug: insulin  degludec Inject 40 Units into the skin daily.        Disposition: Home Discharge Instructions     Diet - low sodium heart healthy   Complete by: As directed    Increase activity slowly   Complete by: As directed         Duration of Discharge Encounter: 15 minutes, APP time.  Bonney Charlies Arthur, PA-C 12/31/2023 10:32 AM

## 2024-01-03 ENCOUNTER — Telehealth: Payer: Self-pay

## 2024-01-03 ENCOUNTER — Ambulatory Visit: Attending: Cardiovascular Disease

## 2024-01-03 DIAGNOSIS — Z95810 Presence of automatic (implantable) cardiac defibrillator: Secondary | ICD-10-CM

## 2024-01-03 DIAGNOSIS — I5042 Chronic combined systolic (congestive) and diastolic (congestive) heart failure: Secondary | ICD-10-CM

## 2024-01-03 NOTE — Patient Instructions (Signed)
 Visit Information  Thank you for taking time to visit with me today. Please don't hesitate to contact me if I can be of assistance to you before our next scheduled telephone appointment.  Our next appointment is by telephone on 01/10/24 at 10am  Following is a copy of your care plan:   Goals Addressed             This Visit's Progress    VBCI Transitions of Care (TOC) Care Plan       Problems:  Recent Hospitalization for treatment of Atrial Fibrillation with Sotalol  initiation   Goal:  Over the next 30 days, the patient will not experience hospital readmission  Interventions:  Transitions of Care: Doctor Visits  - discussed the importance of doctor visits Arranged PCP follow-up within 7 days (Care Guide Scheduled)  AFIB Interventions:   Counseled on increased risk of stroke due to Afib and benefits of anticoagulation for stroke prevention Reviewed importance of adherence to anticoagulant exactly as prescribed Counseled on bleeding risk associated with Eliquis  and importance of self-monitoring for signs/symptoms of bleeding Counseled on avoidance of NSAIDs due to increased bleeding risk with anticoagulants   Asthma: Advised patient to track and manage Asthma triggers Provided instruction about proper use of medications used for management of Asthma including inhalers Discussed the importance of adequate rest and management of fatigue with Asthma   Diabetes Interventions: Assessed patient's understanding of A1c goal: <7% Provided education to patient about basic DM disease process Reviewed medications with patient and discussed importance of medication adherence Discussed plans with patient for ongoing care management follow up and provided patient with direct contact information for care management team Assessed social determinant of health barriers Lab Results  Component Value Date   HGBA1C 7.9 (H) 12/27/2023    Patient Self Care Activities:  Attend all scheduled  provider appointments Call pharmacy for medication refills 3-7 days in advance of running out of medications Call provider office for new concerns or questions  Notify RN Care Manager of TOC call rescheduling needs Participate in Transition of Care Program/Attend TOC scheduled calls Take medications as prescribed   check feet daily for cuts, sores or redness enter blood sugar readings and medication or insulin  into daily log take the blood sugar log to all doctor visits manage portion size wash and dry feet carefully every day wear comfortable, cotton socks wear comfortable, well-fitting shoes Contact MD with any issues including excessive fatigue, dizziness/lightheadedness, chest pain/palpitations  Plan:  Telephone follow up appointment with care management team member scheduled for:  01/10/24 10am The patient has been provided with contact information for the care management team and has been advised to call with any health related questions or concerns.         Patient verbalizes understanding of instructions and care plan provided today and agrees to view in MyChart. Active MyChart status and patient understanding of how to access instructions and care plan via MyChart confirmed with patient.     Telephone follow up appointment with care management team member scheduled for: 01/10/24 The patient has been provided with contact information for the care management team and has been advised to call with any health related questions or concerns.   Please call the care guide team at [phone] if you need to cancel or reschedule your appointment.   Please call the Suicide and Crisis Lifeline: 988 call 911 if you are experiencing a Mental Health or Behavioral Health Crisis or need someone to talk to.  Shona  Lauro RN, CCM [place]  VBCI-Population Health RN Care Manager [phone]

## 2024-01-03 NOTE — Transitions of Care (Post Inpatient/ED Visit) (Signed)
 01/03/2024  Name: [name] MRN: [id number] DOB: 14-Feb-1951  Today's TOC FU Call Status: Today's TOC FU Call Status:: Successful TOC FU Call Completed TOC FU Call Complete Date: 01/03/24 Patient's Name and Date of Birth confirmed.  Transition Care Management Follow-up Telephone Call Date of Discharge: 12/31/23 Discharge Facility: [name]) Type of Discharge: Inpatient Admission Primary Inpatient Discharge Diagnosis:: Atrial Fibrillation -Sotalol  was initiated How have you been since you were released from the hospital?: Better Any questions or concerns?: No  Items Reviewed: Did you receive and understand the discharge instructions provided?: Yes Medications obtained,verified, and reconciled?: Yes (Medications Reviewed) Any new allergies since your discharge?: No Dietary orders reviewed?: Yes Type of Diet Ordered:: Low sodium heart healthy Do you have support at home?: Yes People in Home [RPT]: child(ren), adult Name of Support/Comfort Primary Source: Adult daughter states with patient and helps as needed  Medications Reviewed Today: Medications Reviewed Today     Reviewed by Lauro Shona LABOR, RN (Registered Nurse) on 01/03/24 at 1006  Med List Status: <None>   Medication Order Taking? Sig Documenting Provider Last Dose Status Informant  acetaminophen  (TYLENOL  8 HOUR) 650 MG CR tablet [id number] Yes Take 1 tablet (650 mg total) by mouth every 8 (eight) hours as needed for pain. Meccariello, Con PARAS, MD  Active Self, Pharmacy Records  apixaban  (ELIQUIS ) 5 MG TABS tablet [id number] Yes Take 1 tablet (5 mg total) by mouth 2 (two) times daily. Croitoru, Mihai, MD  Active Self, Pharmacy Records  bisoprolol  (ZEBETA ) 10 MG tablet [id number]  Take 1 tablet (10 mg total) by mouth in the morning and at bedtime. Croitoru, Mihai, MD  Active Self, Pharmacy Records  budesonide -formoterol  (SYMBICORT ) 80-4.5 MCG/ACT inhaler [id number] Yes INHALE 2 PUFFS INTO THE LUNGS IN THE MORNING AND AT  BEDTIME  Patient taking differently: Inhale 2 puffs into the lungs as needed (wheezing/SOB).   Jude Harden GAILS, MD  Active Self, Pharmacy Records  buPROPion  (WELLBUTRIN  XL) 150 MG 24 hr tablet [id number] Yes Take 1 tablet (150 mg total) by mouth daily. Romelle Booty, MD  Active Self, Pharmacy Records  dapagliflozin  propanediol (FARXIGA ) 10 MG TABS tablet [id number] Yes Take 1 tablet (10 mg total) by mouth daily. Romelle Booty, MD  Active Self, Pharmacy Records  diclofenac  Sodium (VOLTAREN ) 1 % GEL [id number] Yes Apply 1 Application topically daily as needed (pain). Leverne Charlies Helling, PA-C  Active   famotidine  (PEPCID ) 20 MG tablet [id number] Yes Take 1 tablet (20 mg total) by mouth daily. Romelle Booty, MD  Active Self, Pharmacy Records  fluticasone  (FLONASE ) 50 MCG/ACT nasal spray [id number] Yes Place 1 spray into both nostrils daily as needed for allergies. Leverne Charlies Helling, PA-C  Active   glucose blood test strip [id number] Yes Use as instructed Chrystal Lamarr RAMAN, MD  Active Self, Pharmacy Records  insulin  degludec (TRESIBA  FLEXTOUCH) 100 UNIT/ML FlexTouch Pen [id number] Yes Inject 40 Units into the skin daily. Romelle Booty, MD  Active Self, Pharmacy Records  Insulin  Pen Needle (BD PEN NEEDLE NANO U/F) 32G X 4 MM MISC [id number] Yes Use as directed with insulin  once daily Romelle Booty, MD  Active Self, Pharmacy Records  levalbuterol  (XOPENEX  [facility]) 45 MCG/ACT inhaler [id number] Yes Inhale 1-2 puffs into the lungs every 4 (four) hours as needed for wheezing Jude Harden GAILS, MD  Active Self, Pharmacy Records  lidocaine  (XYLOCAINE ) 1 % (with pres) injection 3 mL [id number]   Romelle Booty, MD  Active   losartan  (COZAAR ) 25 MG tablet [id number] Yes Take  1 tablet (25 mg total) by mouth at bedtime. Romelle Booty, MD  Active Self, Pharmacy Records  montelukast  (SINGULAIR ) 10 MG tablet [id number] Yes Take 1 tablet (10 mg) by mouth at bedtime. Jude Harden GAILS, MD  Active Self, Pharmacy Records  potassium chloride   (KLOR-CON  M) 10 MEQ tablet [id number] Yes Take 1 tablet (10 mEq total) by mouth daily as directed Nellene Quita SAUNDERS, PA  Active Self, Pharmacy Records  PRODIGY LANCETS 28G MISC [id number] Yes 1 Units by Does not apply route 4 (four) times daily. Chrystal Lamarr RAMAN, MD  Active Self, Pharmacy Records  rosuvastatin  (CRESTOR ) 10 MG tablet [id number] Yes Take 1 tablet (10 mg total) by mouth at bedtime. Leverne Charlies Helling, PA-C  Active   Semaglutide , 2 MG/DOSE, (OZEMPIC , 2 MG/DOSE,) 8 MG/3ML SOPN [id number] Yes Inject 2 mg into the skin once a week. Romelle Booty, MD  Active Self, Pharmacy Records  sotalol  (BETAPACE ) 80 MG tablet [id number] Yes Take 2 tablets (160 mg total) by mouth at bedtime. Leverne Charlies Helling, PA-C  Active   torsemide  (DEMADEX ) 20 MG tablet [id number] Yes Take 1 tablet (20 mg total) by mouth daily. Croitoru, Mihai, MD  Active Self, Pharmacy Records            Home Care and Equipment/Supplies: Were Home Health Services Ordered?: No Any new equipment or medical supplies ordered?: No  Functional Questionnaire: Do you need assistance with bathing/showering or dressing?: Yes (showers when daughter is at home - but is able to do this herself) Do you need assistance with meal preparation?: No Do you need assistance with eating?: No Do you have difficulty maintaining continence: No Do you need assistance with getting out of bed/getting out of a chair/moving?: No Do you have difficulty managing or taking your medications?: No  Follow up appointments reviewed: PCP Follow-up appointment confirmed?: Yes Date of PCP follow-up appointment?: 01/04/24 Follow-up Provider: PCP, [facility] Follow-up appointment confirmed?: Yes Date of Specialist follow-up appointment?: 01/06/24 Follow-Up Specialty Provider:: Renn Helling Leverne w/Dr Parker's office Do you need transportation to your follow-up appointment?: No Do you understand care options if your condition(s) worsen?:  Yes-patient verbalized understanding  SDOH Interventions Today    Flowsheet Row Most Recent Value  SDOH Interventions   Food Insecurity Interventions Intervention Not Indicated  Housing Interventions Intervention Not Indicated  Transportation Interventions Intervention Not Indicated  Utilities Interventions Intervention Not Indicated    Goals Addressed             This Visit's Progress    VBCI Transitions of Care (TOC) Care Plan       Problems:  Recent Hospitalization for treatment of Atrial Fibrillation with Sotalol  initiation   Goal:  Over the next 30 days, the patient will not experience hospital readmission  Interventions:  Transitions of Care: Doctor Visits  - discussed the importance of doctor visits Arranged PCP follow-up within 7 days (Care Guide Scheduled)  AFIB Interventions:   Counseled on increased risk of stroke due to Afib and benefits of anticoagulation for stroke prevention Reviewed importance of adherence to anticoagulant exactly as prescribed Counseled on bleeding risk associated with Eliquis  and importance of self-monitoring for signs/symptoms of bleeding Counseled on avoidance of NSAIDs due to increased bleeding risk with anticoagulants   Asthma: Advised patient to track and manage Asthma triggers Provided instruction about proper use of medications used for management of Asthma including inhalers Discussed the importance of adequate rest and management of fatigue with Asthma   Diabetes Interventions: Assessed patient's  understanding of A1c goal: <7% Provided education to patient about basic DM disease process Reviewed medications with patient and discussed importance of medication adherence Discussed plans with patient for ongoing care management follow up and provided patient with direct contact information for care management team Assessed social determinant of health barriers Lab Results  Component Value Date   HGBA1C 7.9 (H) 12/27/2023     Patient Self Care Activities:  Attend all scheduled provider appointments Call pharmacy for medication refills 3-7 days in advance of running out of medications Call provider office for new concerns or questions  Notify RN Care Manager of TOC call rescheduling needs Participate in Transition of Care Program/Attend TOC scheduled calls Take medications as prescribed   check feet daily for cuts, sores or redness enter blood sugar readings and medication or insulin  into daily log take the blood sugar log to all doctor visits manage portion size wash and dry feet carefully every day wear comfortable, cotton socks wear comfortable, well-fitting shoes Contact MD with any issues including excessive fatigue, dizziness/lightheadedness, chest pain/palpitations  Plan:  Telephone follow up appointment with care management team member scheduled for:  01/10/24 10am The patient has been provided with contact information for the care management team and has been advised to call with any health related questions or concerns.         Shona Prow RN, CCM [place]  VBCI-Population Health RN Care Manager [phone]

## 2024-01-03 NOTE — Progress Notes (Signed)
 EPIC Encounter for ICM Monitoring  Patient Name: [name] is a 73 y.o. female Date: 01/03/2024 Primary Care Physican: Romelle Booty, MD Primary Cardiologist: Croitoru Electrophysiologist: Croitoru 02/26/2023 Weight: 182 lbs 05/24/2023 Weight: 184-185 lbs 06/23/2023 Weight: 184 lbs 07/30/2023 Weight: 182-184 lbs 10/29/2023 Weight: 181 lbs 12/03/2023 Weight: 182 lbs 12/20/2023 Office Weight: 188 lbs 01/03/2024 Weight: 184 lbs                 Since 20-Dec-2023 AT/AF  4 Time in AT/AF  1.0 hr/day (4.1%)   Longest AT/AF  10 hours                  Spoke with patient and heart failure questions reviewed.  Transmission results reviewed.  Pt asymptomatic for fluid accumulation.  She reports feeling really tired all the time.    Optivol thoracic impedance suggesting normal fluid levels with the exception of possible fluid accumulation 8/25-9/13.   Prescribed dosage:  Torsemide  20 mg 1 tablet by mouth daily.   Potassium 10 mEq 1 tablet by mouth by mouth daily.   Labs: 12/31/2023 Creatinine 1.13, BUN 23, Potassium 4.4, Sodium 137, GFR 52  12/30/2023 Creatinine 1.25, BUN 21, Potassium 4.2, Sodium 137, GFR 46  12/29/2023 Creatinine 1.30, BUN 15, Potassium 4.2, Sodium 138, GFR 44  12/28/2023 Creatinine 1.23, BUN 16, Potassium 4.2, Sodium 137, GFR 47 10/26/2023 Creatinine 1.44, BUN 19, Potassium 4.4, Sodium 140, GFR 39, BNP 327 05/07/2023 Creatinine 1.27, BUN 19, Potassium 3.9, Sodium 136, GFR 45 A complete set of results can be found in Results Review.   Recommendations:   No changes and encouraged to call if experiencing any fluid symptoms.     Follow-up plan: ICM clinic phone appointment on 02/07/2024.   91 day device clinic remote transmission 02/16/2024.     Next office visit scheduled:  02/08/2024 with Dr Kennyth.   01/06/2024 with Charlies Arthur, PA.  Recall 07/07/2024 with Dr Francyne.     Copy of ICM check sent to Dr. Francyne.  3 month ICM trend: 01/03/2024.    12-14 Month ICM trend:      Diane GORMAN Garner, RN 01/03/2024 3:20 PM

## 2024-01-04 ENCOUNTER — Encounter: Payer: Self-pay | Admitting: Family Medicine

## 2024-01-04 ENCOUNTER — Ambulatory Visit (INDEPENDENT_AMBULATORY_CARE_PROVIDER_SITE_OTHER): Admitting: Family Medicine

## 2024-01-04 VITALS — BP 114/81 | HR 84 | Ht 66.0 in | Wt 184.0 lb

## 2024-01-04 DIAGNOSIS — I472 Ventricular tachycardia, unspecified: Secondary | ICD-10-CM

## 2024-01-04 NOTE — Assessment & Plan Note (Addendum)
 Has been tolerating sotalol  Close f/u scheduled with cardiology Discussed return precautions Signed DMV form

## 2024-01-04 NOTE — Progress Notes (Signed)
    SUBJECTIVE:   CHIEF COMPLAINT / HPI:   HFU -was admitted 9/15-9/19 for sotalol  load for V. tach.  She was discharged on 160 mg sotalol  nightly.  She has follow-up scheduled with cardiology in 2 days. Today reports doing well Does notice a bit of a difference in herself on sotalol , notices a bit more fatigue but is not bothered by it Denies concerns Has been eating better - cut out ginger ale  Needs handicap placard signed  PERTINENT  PMH / PSH: Vtach, PAF, CHF  OBJECTIVE:   BP 114/81   Pulse 84   Ht 5' 6 (1.676 m)   Wt 184 lb (83.5 kg)   LMP 07/26/2011   SpO2 100%   BMI 29.70 kg/m   General: NAD, pleasant, able to participate in exam Cardiac: RRR, no murmurs auscultated Respiratory: CTAB, normal WOB Abdomen: soft, non-tender, non-distended, normoactive bowel sounds Extremities: warm and well perfused, no edema or cyanosis Skin: warm and dry, no rashes noted Neuro: alert, no obvious focal deficits, speech normal Psych: Normal affect and mood  ASSESSMENT/PLAN:   Assessment & Plan Ventricular tachycardia (HCC) Has been tolerating sotalol  Close f/u scheduled with cardiology Discussed return precautions Signed DMV form   Payton Coward, MD [name]

## 2024-01-04 NOTE — Patient Instructions (Signed)
 All good today - continue all current medications  See you soon

## 2024-01-05 NOTE — Progress Notes (Unsigned)
 Cardiology Office Note:  .   Date:  01/05/2024  ID:  [name], DOB [birth year], MRN [id number] PCP: Romelle Booty, MD  [facility] Providers Cardiologist:  Jerel Balding, MD Electrophysiologist:  Fonda Kitty, MD {  History of Present Illness: [name]   [name] is a 73 y.o. female w/PMHx of  OSA w/CPAP, HTN, DM NICM, chronic CHF (systolic >> recovered LVEF by echo 2020) (No CAD by cath 2012) AFib ICD VT  Of late with increasing VT burden planned to stop tikosyn  > start sotalol  Sotalol  loaded, QTc stable Renal function required daily dosing schedule Discharged 12/27/23  Today's visit is scheduled as her post sotalol  load visit ROS:   She is accompanied by a close friend today Generally feels like she is more tired on the sotalol  Reports some dizzy spells, once on the kitchen felt like the whole kitchen was moving! Sat down and it settled This seemed though perhaps pre-sotalol . Though she does say pre-post sotalol  feels like when up on her feet gets dizzy on occasion, sitting always helps She denies CP, SOB No syncope No shocks  Reports excellent mediation compliance    Device information MDT dual chamber ICD implanted 05/02/2010, gen change 09/28/2017  + appropriate therapy (VT) 2016, 2022,April 2024 > June 2025 Despite recovered LVEF  Arrhythmia/AAD hx VT > no AAD AFib/AFlutter also described historically Dr. JAYSON reports: recurrent paroxysmal atrial fibrillation status post 2 ablation procedures (Dr. Charlott), with history of ventricular fibrillation arrest while on treatment with diltiazem and dofetilide  and a prolonged QT interval, probably related to simultaneous quinolone therapy  Tikosyn  (seems to go back to 2009, ? Off and back on perhaps 2012) >> stopped Sept 2025 2/2 recurrent VT Sotalol  started Sept 2025  Studies Reviewed: [name]    EKG done today and reviewed by myself:  A paced/V sensed, 80bpm, QTc   DEVICE interrogation done today and  reviewed by myself Battery and auto lead measurements are good AF burden 4.3 % rate controlled No VT AP 94.9% VP 0.4%   11/05/23: TTE 1. Left ventricular ejection fraction, by estimation, is 45 to 50%. The  left ventricle has mildly decreased function. The left ventricle  demonstrates regional wall motion abnormalities with basal inferior,  inferolateral, and anterolateral severe  hypokinesis. There is mild concentric left ventricular hypertrophy. Left  ventricular diastolic parameters are consistent with Grade I diastolic  dysfunction (impaired relaxation).   2. Right ventricular systolic function is normal. The right ventricular  size is normal. There is normal pulmonary artery systolic pressure. The  estimated right ventricular systolic pressure is 31.1 mmHg.   3. The mitral valve is normal in structure. Trivial mitral valve  regurgitation. No evidence of mitral stenosis.   4. The tricuspid valve is abnormal. Tricuspid valve regurgitation is  moderate.   5. The aortic valve is tricuspid. Aortic valve regurgitation is mild. No  aortic stenosis is present.   6. Aortic dilatation noted. There is borderline dilatation of the  ascending aorta, measuring 38 mm.   7. The inferior vena cava is normal in size with greater than 50%  respiratory variability, suggesting right atrial pressure of 3 mmHg.     ECHO 09/19/2018  1. The left ventricle has normal systolic function, with an ejection fraction of 55-60%. The cavity size was normal. There is mild asymmetric left ventricular hypertrophy. Left ventricular diastolic Doppler parameters are consistent with impaired  relaxation. No evidence of left ventricular regional wall motion abnormalities.  2. The  average left ventricular global longitudinal strain is 14.9 %.  3. The right ventricle has normal systolic function. The cavity was normal. There is no increase in right ventricular wall thickness.  4. Left atrial size was mildly dilated.   5. The aortic valve is tricuspid. Mild sclerosis of the aortic valve. Aortic valve regurgitation is trivial by color flow Doppler.   Risk Assessment/Calculations:    Physical Exam:   VS:  LMP 07/26/2011    Wt Readings from Last 3 Encounters:  01/04/24 184 lb (83.5 kg)  01/03/24 187 lb (84.8 kg)  12/27/23 187 lb 12.8 oz (85.2 kg)    GEN: Well nourished, well developed in no acute distress NECK: No JVD; No carotid bruits CARDIAC: RRR, no murmurs, rubs, gallops RESPIRATORY:  CTA b/l without rales, wheezing or rhonchi  ABDOMEN: Soft, non-tender, non-distended EXTREMITIES: No edema; No deformity   ICD site: is stable, no thinning, fluctuation, tethering  ASSESSMENT AND PLAN: .    VT Sotalol  w/stable QTc None since on sotalol  Labs today  She reports perhaps increased fatigue w/sotalol  ? Orthostatic sounding dizziness Orthostatic vitals today negative, though fairly flat HR and BP response to upright, did not drop  Discussed with the patient (and her daughter via speaker phone) would prefer not to make any further changes as this juncture, so soon after the transition to sotalol . At her next visit perhaps reduce her bisoprolol  dose if fatigue, orthostatic symptoms not improved  She will let us  know if there is any escalation in symptoms   ICD intact function no programming changes made   Paroxysmal AFib CHA2DS2Vasc is 3, on Eliquis , appropriately dosed Now on Sotalol  w/stable QTc 4.3 % burden 2 episodes since on sotalol  > longest just under 3 hours, both overnight, and rate controlled  NICM LVEF 45-50% by her echo July 2025 No symptoms or exam findings of volume OL OptVol is below threshold C/w Dr. Phillis   Secondary hypercoagulable state 2/2 AFib       Dispo: back in *** 38mo, sooner if needed  Signed, Charlies Macario Arthur, PA-C

## 2024-01-06 ENCOUNTER — Ambulatory Visit: Attending: Physician Assistant | Admitting: Physician Assistant

## 2024-01-06 ENCOUNTER — Encounter: Payer: Self-pay | Admitting: Physician Assistant

## 2024-01-06 VITALS — BP 122/70 | HR 80 | Ht 66.0 in | Wt 184.4 lb

## 2024-01-06 DIAGNOSIS — I480 Paroxysmal atrial fibrillation: Secondary | ICD-10-CM | POA: Diagnosis not present

## 2024-01-06 DIAGNOSIS — I472 Ventricular tachycardia, unspecified: Secondary | ICD-10-CM

## 2024-01-06 DIAGNOSIS — D6869 Other thrombophilia: Secondary | ICD-10-CM

## 2024-01-06 DIAGNOSIS — Z5181 Encounter for therapeutic drug level monitoring: Secondary | ICD-10-CM

## 2024-01-06 DIAGNOSIS — Z95810 Presence of automatic (implantable) cardiac defibrillator: Secondary | ICD-10-CM

## 2024-01-06 DIAGNOSIS — I428 Other cardiomyopathies: Secondary | ICD-10-CM

## 2024-01-06 DIAGNOSIS — Z79899 Other long term (current) drug therapy: Secondary | ICD-10-CM | POA: Diagnosis not present

## 2024-01-06 LAB — CUP PACEART INCLINIC DEVICE CHECK
Battery Remaining Longevity: 28 mo
Battery Voltage: 2.92 V
Brady Statistic AP VP Percent: 0.3 %
Brady Statistic AP VS Percent: 94.57 %
Brady Statistic AS VP Percent: 0.04 %
Brady Statistic AS VS Percent: 5.1 %
Brady Statistic RA Percent Paced: 88.99 %
Brady Statistic RV Percent Paced: 0.39 %
Date Time Interrogation Session: 20250925095813
HighPow Impedance: 79 Ohm
Implantable Lead Connection Status: 753985
Implantable Lead Connection Status: 753985
Implantable Lead Implant Date: 20120120
Implantable Lead Implant Date: 20120120
Implantable Lead Location: 753859
Implantable Lead Location: 753860
Implantable Lead Model: 5076
Implantable Lead Model: 6935
Implantable Pulse Generator Implant Date: 20190618
Lead Channel Impedance Value: 342 Ohm
Lead Channel Impedance Value: 437 Ohm
Lead Channel Impedance Value: 456 Ohm
Lead Channel Pacing Threshold Amplitude: 0.5 V
Lead Channel Pacing Threshold Amplitude: 0.875 V
Lead Channel Pacing Threshold Pulse Width: 0.4 ms
Lead Channel Pacing Threshold Pulse Width: 0.4 ms
Lead Channel Sensing Intrinsic Amplitude: 1.75 mV
Lead Channel Sensing Intrinsic Amplitude: 1.75 mV
Lead Channel Sensing Intrinsic Amplitude: 10.875 mV
Lead Channel Sensing Intrinsic Amplitude: 10.875 mV
Lead Channel Setting Pacing Amplitude: 2 V
Lead Channel Setting Pacing Amplitude: 2.5 V
Lead Channel Setting Pacing Pulse Width: 0.4 ms
Lead Channel Setting Sensing Sensitivity: 0.3 mV
Zone Setting Status: 755011
Zone Setting Status: 755011

## 2024-01-06 NOTE — Patient Instructions (Addendum)
 Medication Instructions:  Your physician recommends that you continue on your current medications as directed. Please refer to the Current Medication list given to you today.  * If you need a refill on your cardiac medications before your next appointment, please call your pharmacy*    Lab Work: Magnesium  and BMET   If you have labs (blood work) drawn today and your tests are completely normal, you will receive your results only by: MyChart Message (if you have MyChart) OR A paper copy in the mail If you have any lab test that is abnormal or we need to change your treatment, we will call you to review the results.    Testing/Procedures: None    Follow-Up:   At [facility], you and your health needs are our priority.  As part of our continuing mission to provide you with exceptional heart care, our providers are all part of one team.  This team includes your primary Cardiologist (physician) and Advanced Practice Providers or APPs (Physician Assistants and Nurse Practitioners) who all work together to provide you with the care you need, when you need it.  Your next appointment:  In 1 month as scheduled.   Provider:  Fonda Kitty, MD    We recommend signing up for the patient portal called MyChart.  Sign up information is provided on this After Visit Summary.  MyChart is used to connect with patients for Virtual Visits (Telemedicine).  Patients are able to view lab/test results, encounter notes, upcoming appointments, etc.  Non-urgent messages can be sent to your provider as well.   To learn more about what you can do with MyChart, go to [email or web address].

## 2024-01-07 ENCOUNTER — Ambulatory Visit: Payer: Self-pay

## 2024-01-07 LAB — BASIC METABOLIC PANEL WITH GFR
BUN/Creatinine Ratio: 13 (ref 12–28)
BUN: 19 mg/dL (ref 8–27)
CO2: 24 mmol/L (ref 20–29)
Calcium: 9.3 mg/dL (ref 8.7–10.3)
Chloride: 100 mmol/L (ref 96–106)
Creatinine, Ser: 1.46 mg/dL — ABNORMAL HIGH (ref 0.57–1.00)
Glucose: 115 mg/dL — ABNORMAL HIGH (ref 70–99)
Potassium: 4.8 mmol/L (ref 3.5–5.2)
Sodium: 141 mmol/L (ref 134–144)
eGFR: 38 mL/min/1.73 — ABNORMAL LOW (ref >59)

## 2024-01-07 LAB — MAGNESIUM: Magnesium: 2.3 mg/dL (ref 1.6–2.3)

## 2024-01-10 ENCOUNTER — Other Ambulatory Visit: Payer: Self-pay

## 2024-01-10 NOTE — Progress Notes (Signed)
 Remote ICD Transmission

## 2024-01-10 NOTE — Patient Instructions (Signed)
 Visit Information  Thank you for taking time to visit with me today. Please don't hesitate to contact me if I can be of assistance to you before our next scheduled telephone appointment.  Our next appointment is by telephone on 01/18/24  in the morning  Following is a copy of your care plan:   Goals Addressed             This Visit's Progress    VBCI Transitions of Care (TOC) Care Plan       Problems:  Recent Hospitalization for treatment of Atrial Fibrillation with Sotalol  initiation   Goal:  Over the next 30 days, the patient will not experience hospital readmission  Interventions:  Transitions of Care: Doctor Visits  - discussed the importance of doctor visits Arranged PCP follow-up within 7 days (Care Guide Scheduled) Update 01/10/24: Had PCP, Dr Romelle appt 9/23 HFU - since new Sotalol  - notices a bit more fatigue but is tolerating it - no med changes - Had 9/25 appt w/ Cardio, Leverne Charlies Helling, PA-C  VSS 184 lb 6.4  BMI 29.76  - discussed fatigue with cardio - will monitor and if she can't tolerate, she will call them otherwise see Dr Fonda Kitty 02/08/24 as planned - has hx of asthma states she has had some mild shortness of breath that resolved and states she also has asthma and knows when to seek treatment  and does not feel a need at this time - reviewed when to seek treatment and s/e with Sotolol patient denies chest pain- - states she has some mild nausea last night and this morning but this has resolved - states she had this in the hospital as well and it resolved and states she will call cardio if this continues  patient reports sugar 135 this morning    AFIB Interventions:   Counseled on increased risk of stroke due to Afib and benefits of anticoagulation for stroke prevention Reviewed importance of adherence to anticoagulant exactly as prescribed Counseled on bleeding risk associated with Eliquis  and importance of self-monitoring for signs/symptoms of  bleeding Counseled on avoidance of NSAIDs due to increased bleeding risk with anticoagulants   Asthma: Advised patient to track and manage Asthma triggers Provided instruction about proper use of medications used for management of Asthma including inhalers Discussed the importance of adequate rest and management of fatigue with Asthma   Diabetes Interventions: Assessed patient's understanding of A1c goal: <7% Provided education to patient about basic DM disease process Reviewed medications with patient and discussed importance of medication adherence Discussed plans with patient for ongoing care management follow up and provided patient with direct contact information for care management team Assessed social determinant of health barriers Lab Results  Component Value Date   HGBA1C 7.9 (H) 12/27/2023    Patient Self Care Activities:  Attend all scheduled provider appointments Call pharmacy for medication refills 3-7 days in advance of running out of medications Call provider office for new concerns or questions  Notify RN Care Manager of TOC call rescheduling needs Participate in Transition of Care Program/Attend TOC scheduled calls Take medications as prescribed   check feet daily for cuts, sores or redness enter blood sugar readings and medication or insulin  into daily log take the blood sugar log to all doctor visits manage portion size wash and dry feet carefully every day wear comfortable, cotton socks wear comfortable, well-fitting shoes Contact MD with any issues including excessive fatigue, dizziness/lightheadedness, chest pain/palpitations  Plan:  Telephone follow up  appointment with care management team member scheduled for:  01/18/24 AM The patient has been provided with contact information for the care management team and has been advised to call with any health related questions or concerns.         Patient verbalizes understanding of instructions and care plan  provided today and agrees to view in MyChart. Active MyChart status and patient understanding of how to access instructions and care plan via MyChart confirmed with patient.     Telephone follow up appointment with care management team member scheduled for: 01/18/24 in the AM The patient has been provided with contact information for the care management team and has been advised to call with any health related questions or concerns.   Please call the care guide team at [phone] if you need to cancel or reschedule your appointment.   Please call the Suicide and Crisis Lifeline: 988 call 911 if you are experiencing a Mental Health or Behavioral Health Crisis or need someone to talk to.  Shona Prow RN, CCM [place]  VBCI-Population Health RN Care Manager [phone]

## 2024-01-10 NOTE — Transitions of Care (Post Inpatient/ED Visit) (Signed)
 Transition of Care week 2  Visit Note  01/10/2024  Name: [name] MRN: [id number]          DOB: [birth year]  Situation: Patient enrolled in [facility] 30-day program. Visit completed with patient by telephone.   Background: Admit/Discharge Date   9/15 -  9/19 [name]   Primary Diagnosis: Atrial Fibrillation - Sotalol  initiation   Initial Transition Care Management Follow-up Telephone Call    Past Medical History:  Diagnosis Date   AICD (automatic cardioverter/defibrillator) present    Anemia    Anxiety    Arthritis    Arthritis of carpometacarpal (CMC) joint of thumb 03/26/2016   Asthma    has had multiple hospitalizations for this   Asthma, moderate persistent 12/10/2011   Atrial fibrillation (HCC)    ablation x 2 [facility], 01/2006, 2011.  on warfarin   Cardiac arrest (HCC) 04/2010   in hospital for pneumonia when this occured- occured at the hospital   CHF (congestive heart failure) (HCC) 2012   Echo 08/08/10 by [facility]. EF 35-45%. LV systolic function moderately reduced. Moderate global hypokinesis of LV.  RV systolic function moderately reduced. Mild MR. Trace AR.   Chronic diastolic heart failure (HCC) 01/07/2018   CKD (chronic kidney disease) stage 2, GFR 60-89 ml/min 02/22/2012   Her cr range 1.2-1.5 since Jan 2012 after hospitalization. 10/13 Estimated Creatinine Clearance: 69.3 ml/min (by C-G formula based on Cr of 1).     Complication of anesthesia    difficult time waking up after anesthesia   Depression    Diabetes mellitus    Type 2   Diabetic neuropathy (HCC) 09/18/2011   GASTROESOPHAGEAL REFLUX, NO ESOPHAGITIS 06/10/2006   Qualifier: Diagnosis of  By: Sharron Railing     GERD (gastroesophageal reflux disease) 03/23/2018   History of hiatal hernia    History of kidney stones    HYPERCHOLESTEROLEMIA 06/10/2006   LDL 93 at 01/2011 check-  Continue pravastatin  20mg  po daily.  --discuss health modifications and possibly increaseing dose at next appt.   Cardiology- Dr little- stopped pravachol  on 05/21/11- 2/2 allergies?     Hyperlipidemia    Hypertension    ICD (St. Jude Protecta dual-chamber),secondary prevention (VF arrest) January 2012 11/19/2012   Iliotibial band syndrome, left 11/01/2018   Limb pain 06/04/2008   LLE, Baker's cyst in popliteal fossa, no DVT   Long term current use of anticoagulant therapy 08/21/2015   Non-ischemic cardiomyopathy (HCC)    echo 08/08/10 - EF 35-45% LV and RV systolic function mod reduced   Obesity (BMI 30-39.9) 01/04/2014   OSA on CPAP 06/10/2006   Sleep study 02/2014 : severe apnea, corrected with nasal pillows and 8cmh2o     Osteoarthritis of right knee 08/28/2008   Qualifier: Diagnosis of  By: Cleatrice MD, Ludie     Primary localized osteoarthritis of right knee    Sleep apnea    wears CPAP nightly   Type 2 diabetes mellitus with diabetic neuropathy, with long-term current use of insulin  (HCC) 06/10/2006              Ventricular tachycardia (HCC) 05/22/2015   Visit for monitoring Tikosyn  therapy 08/21/2015   Vocal cord disease     Assessment: Patient Reported Symptoms: Cognitive Cognitive Status: No symptoms reported, Alert and oriented to person, place, and time, Normal speech and language skills      Neurological Neurological Review of Symptoms: No symptoms reported    HEENT HEENT Symptoms Reported: No symptoms reported  Cardiovascular Cardiovascular Symptoms Reported: Fatigue (patient reports she has had some fatigue since starting on Sotalol  was started - denies chest - states she has had some mild shortness of breath that resolved and states she also has asthma and knows when to seek treatment) Cardiovascular Management Strategies: Medication therapy Cardiovascular Self-Management Outcome: 3 (uncertain)  Respiratory Respiratory Symptoms Reported: No symptoms reported    Endocrine Endocrine Symptoms Reported: No symptoms reported Is patient diabetic?: Yes Is patient checking blood  sugars at home?: Yes List most recent blood sugar readings, include date and time of day: reports sugar 135 this morning Endocrine Self-Management Outcome: 4 (good)  Gastrointestinal Gastrointestinal Symptoms Reported: Nausea Additional Gastrointestinal Details: patient states she had some nausea last night and this morning but it has resolved Gastrointestinal Self-Management Outcome: 3 (uncertain)    Genitourinary Genitourinary Symptoms Reported: No symptoms reported Additional Genitourinary Details: history of kidney disease - states PCP monitors Genitourinary Self-Management Outcome: 3 (uncertain)  Integumentary Integumentary Symptoms Reported: No symptoms reported    Musculoskeletal Musculoskelatal Symptoms Reviewed: No symptoms reported   Falls in the past year?: Yes Number of falls in past year: 1 or less Was there an injury with Fall?: No Fall Risk Category Calculator: 1 Patient Fall Risk Level: Low Fall Risk    Psychosocial Psychosocial Symptoms Reported: No symptoms reported         There were no vitals filed for this visit.  Medications Reviewed Today     Reviewed by Lauro Shona LABOR, RN (Registered Nurse) on 01/10/24 at 1032  Med List Status: <None>   Medication Order Taking? Sig Documenting Provider Last Dose Status Informant  acetaminophen  (TYLENOL  8 HOUR) 650 MG CR tablet [id number] Yes Take 1 tablet (650 mg total) by mouth every 8 (eight) hours as needed for pain. Meccariello, Con PARAS, MD  Active Self, Pharmacy Records  apixaban  (ELIQUIS ) 5 MG TABS tablet [id number] Yes Take 1 tablet (5 mg total) by mouth 2 (two) times daily. Croitoru, Mihai, MD  Active Self, Pharmacy Records  bisoprolol  (ZEBETA ) 10 MG tablet [id number] Yes Take 1 tablet (10 mg total) by mouth in the morning and at bedtime. Croitoru, Mihai, MD  Active Self, Pharmacy Records  budesonide -formoterol  (SYMBICORT ) 80-4.5 MCG/ACT inhaler [id number] Yes INHALE 2 PUFFS INTO THE LUNGS IN THE MORNING AND AT  BEDTIME Jude Harden GAILS, MD  Active Self, Pharmacy Records  buPROPion  (WELLBUTRIN  XL) 150 MG 24 hr tablet [id number] Yes Take 1 tablet (150 mg total) by mouth daily. Romelle Booty, MD  Active Self, Pharmacy Records  dapagliflozin  propanediol (FARXIGA ) 10 MG TABS tablet [id number] Yes Take 1 tablet (10 mg total) by mouth daily. Romelle Booty, MD  Active Self, Pharmacy Records  diclofenac  Sodium (VOLTAREN ) 1 % GEL [id number] Yes Apply 1 Application topically daily as needed (pain). Leverne Charlies Helling, PA-C  Active   famotidine  (PEPCID ) 20 MG tablet [id number] Yes Take 1 tablet (20 mg total) by mouth daily. Romelle Booty, MD  Active Self, Pharmacy Records  fluticasone  (FLONASE ) 50 MCG/ACT nasal spray [id number] Yes Place 1 spray into both nostrils daily as needed for allergies. Leverne Charlies Helling, PA-C  Active   glucose blood test strip [id number] Yes Use as instructed Chrystal Lamarr RAMAN, MD  Active Self, Pharmacy Records  insulin  degludec (TRESIBA  FLEXTOUCH) 100 UNIT/ML FlexTouch Pen [id number] Yes Inject 40 Units into the skin daily. Romelle Booty, MD  Active Self, Pharmacy Records  Insulin  Pen Needle (BD PEN NEEDLE NANO U/F) 32G X 4 MM MISC [id number] Yes Use as directed  with insulin  once daily Romelle Booty, MD  Active Self, Pharmacy Records  levalbuterol  (XOPENEX  HFA) 45 MCG/ACT inhaler [id number] Yes Inhale 1-2 puffs into the lungs every 4 (four) hours as needed for wheezing Jude Harden GAILS, MD  Active Self, Pharmacy Records  lidocaine  (XYLOCAINE ) 1 % (with pres) injection 3 mL [id number]   Romelle Booty, MD  Active   losartan  (COZAAR ) 25 MG tablet [id number] Yes Take 1 tablet (25 mg total) by mouth at bedtime. Romelle Booty, MD  Active Self, Pharmacy Records  montelukast  (SINGULAIR ) 10 MG tablet [id number] Yes Take 1 tablet (10 mg) by mouth at bedtime. Jude Harden GAILS, MD  Active Self, Pharmacy Records  potassium chloride  (KLOR-CON  M) 10 MEQ tablet [id number] Yes Take 1 tablet (10 mEq total) by mouth daily as  directed Nellene Quita SAUNDERS, PA  Active Self, Pharmacy Records  PRODIGY LANCETS 28G MISC [id number] Yes 1 Units by Does not apply route 4 (four) times daily. Chrystal Lamarr RAMAN, MD  Active Self, Pharmacy Records  rosuvastatin  (CRESTOR ) 10 MG tablet [id number] Yes Take 1 tablet (10 mg total) by mouth at bedtime. Leverne Charlies Helling, PA-C  Active   Semaglutide , 2 MG/DOSE, (OZEMPIC , 2 MG/DOSE,) 8 MG/3ML SOPN [id number] Yes Inject 2 mg into the skin once a week. Romelle Booty, MD  Active Self, Pharmacy Records  sotalol  (BETAPACE ) 80 MG tablet [id number] Yes Take 2 tablets (160 mg total) by mouth at bedtime. Leverne Charlies Helling, PA-C  Active   torsemide  (DEMADEX ) 20 MG tablet [id number] Yes Take 1 tablet (20 mg total) by mouth daily. Croitoru, Mihai, MD  Active Self, Pharmacy Records            Recommendation:   Continue Current Plan of Care  Follow Up Plan:   Telephone follow up appointment date/time:  01/18/24 AM  Shona Prow RN, CCM [place]  VBCI-Population Health RN Care Manager [phone]

## 2024-01-11 ENCOUNTER — Ambulatory Visit: Admitting: Cardiology

## 2024-01-12 ENCOUNTER — Other Ambulatory Visit (HOSPITAL_COMMUNITY): Payer: Self-pay

## 2024-01-17 ENCOUNTER — Other Ambulatory Visit: Payer: Self-pay

## 2024-01-17 ENCOUNTER — Other Ambulatory Visit (HOSPITAL_COMMUNITY): Payer: Self-pay

## 2024-01-17 DIAGNOSIS — J45909 Unspecified asthma, uncomplicated: Secondary | ICD-10-CM | POA: Diagnosis not present

## 2024-01-17 DIAGNOSIS — G4733 Obstructive sleep apnea (adult) (pediatric): Secondary | ICD-10-CM | POA: Diagnosis not present

## 2024-01-18 ENCOUNTER — Other Ambulatory Visit (HOSPITAL_COMMUNITY): Payer: Self-pay

## 2024-01-18 ENCOUNTER — Ambulatory Visit (HOSPITAL_COMMUNITY): Admitting: Physician Assistant

## 2024-01-18 ENCOUNTER — Telehealth: Payer: Self-pay

## 2024-01-18 NOTE — Transitions of Care (Post Inpatient/ED Visit) (Signed)
 Transition of Care week 3  Visit Note  01/18/2024  Name: [name] MRN: [id number]          DOB: March 06, 1951  Situation: Patient enrolled in [facility] 30-day program. Visit completed with patient by telephone.   Background: Admit/Discharge Date   9/15 -  9/19 [name]   Primary Diagnosis: Atrial Fibrillation - Sotalol  initiation   Initial Transition Care Management Follow-up Telephone Call    Past Medical History:  Diagnosis Date   AICD (automatic cardioverter/defibrillator) present    Anemia    Anxiety    Arthritis    Arthritis of carpometacarpal (CMC) joint of thumb 03/26/2016   Asthma    has had multiple hospitalizations for this   Asthma, moderate persistent 12/10/2011   Atrial fibrillation (HCC)    ablation x 2 [facility], 01/2006, 2011.  on warfarin   Cardiac arrest (HCC) 04/2010   in hospital for pneumonia when this occured- occured at the hospital   CHF (congestive heart failure) (HCC) 2012   Echo 08/08/10 by [facility]. EF 35-45%. LV systolic function moderately reduced. Moderate global hypokinesis of LV.  RV systolic function moderately reduced. Mild MR. Trace AR.   Chronic diastolic heart failure (HCC) 01/07/2018   CKD (chronic kidney disease) stage 2, GFR 60-89 ml/min 02/22/2012   Her cr range 1.2-1.5 since Jan 2012 after hospitalization. 10/13 Estimated Creatinine Clearance: 69.3 ml/min (by C-G formula based on Cr of 1).     Complication of anesthesia    difficult time waking up after anesthesia   Depression    Diabetes mellitus    Type 2   Diabetic neuropathy (HCC) 09/18/2011   GASTROESOPHAGEAL REFLUX, NO ESOPHAGITIS 06/10/2006   Qualifier: Diagnosis of  By: Sharron Railing     GERD (gastroesophageal reflux disease) 03/23/2018   History of hiatal hernia    History of kidney stones    HYPERCHOLESTEROLEMIA 06/10/2006   LDL 93 at 01/2011 check-  Continue pravastatin  20mg  po daily.  --discuss health modifications and possibly increaseing dose at next appt.   Cardiology- Dr little- stopped pravachol  on 05/21/11- 2/2 allergies?     Hyperlipidemia    Hypertension    ICD (St. Jude Protecta dual-chamber),secondary prevention (VF arrest) January 2012 11/19/2012   Iliotibial band syndrome, left 11/01/2018   Limb pain 06/04/2008   LLE, Baker's cyst in popliteal fossa, no DVT   Long term current use of anticoagulant therapy 08/21/2015   Non-ischemic cardiomyopathy (HCC)    echo 08/08/10 - EF 35-45% LV and RV systolic function mod reduced   Obesity (BMI 30-39.9) 01/04/2014   OSA on CPAP 06/10/2006   Sleep study 02/2014 : severe apnea, corrected with nasal pillows and 8cmh2o     Osteoarthritis of right knee 08/28/2008   Qualifier: Diagnosis of  By: Cleatrice MD, Ludie     Primary localized osteoarthritis of right knee    Sleep apnea    wears CPAP nightly   Type 2 diabetes mellitus with diabetic neuropathy, with long-term current use of insulin  (HCC) 06/10/2006              Ventricular tachycardia (HCC) 05/22/2015   Visit for monitoring Tikosyn  therapy 08/21/2015   Vocal cord disease     Assessment: Patient Reported Symptoms: Cognitive Cognitive Status: No symptoms reported, Normal speech and language skills, Alert and oriented to person, place, and time      Neurological Neurological Review of Symptoms: No symptoms reported    HEENT HEENT Symptoms Reported: No symptoms reported  Cardiovascular Cardiovascular Symptoms Reported: No symptoms reported Cardiovascular Management Strategies: Medication therapy Cardiovascular Self-Management Outcome: 4 (good) Cardiovascular Comment: Patient states she is feeling good with no further fatigue - states she hasn't had an fluid in a long long time.  Feels like the Sotalol  is no longer causing the fatigue  Respiratory Respiratory Symptoms Reported: No symptoms reported Respiratory Management Strategies: Medication therapy, CPAP, Asthma action plan Respiratory Self-Management Outcome: 3 (uncertain)   Endocrine Endocrine Symptoms Reported: No symptoms reported Is patient diabetic?: Yes Is patient checking blood sugars at home?: Yes List most recent blood sugar readings, include date and time of day: patient reports sugar this morning 129 Endocrine Self-Management Outcome: 4 (good)  Gastrointestinal Gastrointestinal Symptoms Reported: No symptoms reported      Genitourinary Genitourinary Symptoms Reported: No symptoms reported    Integumentary Integumentary Symptoms Reported: No symptoms reported    Musculoskeletal Musculoskelatal Symptoms Reviewed: Other Other Musculoskeletal Symptoms: patient states prior to hospitalization she was going to the gym 3-4x/week -riding bike, weights and plans to start back to the gym states she feels her legs are not as strong since not going to the gym and plans to restar - educated to start slow and progress as tolerated and per MD - has started walking with neighbor to her mailbox and is wearing her diabetic shoes        Psychosocial Psychosocial Symptoms Reported: No symptoms reported Additional Psychological Details: Patient denies issues and states she takes Wellbutrin  - no longer depressed         There were no vitals filed for this visit.  Medications Reviewed Today     Reviewed by Lauro Shona LABOR, RN (Registered Nurse) on 01/18/24 at 1004  Med List Status: <None>   Medication Order Taking? Sig Documenting Provider Last Dose Status Informant  acetaminophen  (TYLENOL  8 HOUR) 650 MG CR tablet [id number] Yes Take 1 tablet (650 mg total) by mouth every 8 (eight) hours as needed for pain. Meccariello, Con PARAS, MD  Active Self, Pharmacy Records  apixaban  (ELIQUIS ) 5 MG TABS tablet [id number] Yes Take 1 tablet (5 mg total) by mouth 2 (two) times daily. Croitoru, Mihai, MD  Active Self, Pharmacy Records  bisoprolol  (ZEBETA ) 10 MG tablet [id number] Yes Take 1 tablet (10 mg total) by mouth in the morning and at bedtime. Croitoru, Mihai, MD  Active  Self, Pharmacy Records  budesonide -formoterol  (SYMBICORT ) 80-4.5 MCG/ACT inhaler [id number] Yes INHALE 2 PUFFS INTO THE LUNGS IN THE MORNING AND AT BEDTIME Jude Harden GAILS, MD  Active Self, Pharmacy Records  buPROPion  (WELLBUTRIN  XL) 150 MG 24 hr tablet [id number] Yes Take 1 tablet (150 mg total) by mouth daily. Romelle Booty, MD  Active Self, Pharmacy Records  dapagliflozin  propanediol (FARXIGA ) 10 MG TABS tablet [id number] Yes Take 1 tablet (10 mg total) by mouth daily. Romelle Booty, MD  Active Self, Pharmacy Records  diclofenac  Sodium (VOLTAREN ) 1 % GEL [id number] Yes Apply 1 Application topically daily as needed (pain). Leverne Charlies Helling, PA-C  Active   famotidine  (PEPCID ) 20 MG tablet [id number] Yes Take 1 tablet (20 mg total) by mouth daily. Romelle Booty, MD  Active Self, Pharmacy Records  fluticasone  (FLONASE ) 50 MCG/ACT nasal spray [id number] Yes Place 1 spray into both nostrils daily as needed for allergies. Leverne Charlies Helling, PA-C  Active   glucose blood test strip [id number] Yes Use as instructed Chrystal Lamarr RAMAN, MD  Active Self, Pharmacy Records  insulin  degludec (TRESIBA  FLEXTOUCH) 100 UNIT/ML FlexTouch Pen [id number] Yes Inject 40 Units into  the skin daily. Romelle Booty, MD  Active Self, Pharmacy Records  Insulin  Pen Needle (BD PEN NEEDLE NANO U/F) 32G X 4 MM MISC [id number] Yes Use as directed with insulin  once daily Romelle Booty, MD  Active Self, Pharmacy Records  levalbuterol  (XOPENEX  HFA) 45 MCG/ACT inhaler [id number] Yes Inhale 1-2 puffs into the lungs every 4 (four) hours as needed for wheezing Jude Harden GAILS, MD  Active Self, Pharmacy Records  lidocaine  (XYLOCAINE ) 1 % (with pres) injection 3 mL [id number]   Romelle Booty, MD  Active   losartan  (COZAAR ) 25 MG tablet [id number] Yes Take 1 tablet (25 mg total) by mouth at bedtime. Romelle Booty, MD  Active Self, Pharmacy Records  montelukast  (SINGULAIR ) 10 MG tablet [id number] Yes Take 1 tablet (10 mg) by mouth at bedtime. Jude Harden GAILS,  MD  Active Self, Pharmacy Records  potassium chloride  (KLOR-CON  M) 10 MEQ tablet [id number] Yes Take 1 tablet (10 mEq total) by mouth daily as directed Nellene Quita SAUNDERS, PA  Active Self, Pharmacy Records  PRODIGY LANCETS 28G MISC [id number] Yes 1 Units by Does not apply route 4 (four) times daily. Chrystal Lamarr RAMAN, MD  Active Self, Pharmacy Records  rosuvastatin  (CRESTOR ) 10 MG tablet [id number] Yes Take 1 tablet (10 mg total) by mouth at bedtime. Leverne Charlies Helling, PA-C  Active   Semaglutide , 2 MG/DOSE, (OZEMPIC , 2 MG/DOSE,) 8 MG/3ML SOPN [id number] Yes Inject 2 mg into the skin once a week. Romelle Booty, MD  Active Self, Pharmacy Records  sotalol  (BETAPACE ) 80 MG tablet [id number] Yes Take 2 tablets (160 mg total) by mouth at bedtime. Leverne Charlies Helling, PA-C  Active   torsemide  (DEMADEX ) 20 MG tablet [id number] Yes Take 1 tablet (20 mg total) by mouth daily. Croitoru, Mihai, MD  Active Self, Pharmacy Records            Recommendation:   Continue Current Plan of Care  Follow Up Plan:   Telephone follow up appointment date/time:  01/25/24 in the morning  Shona Prow RN, CCM [place]  VBCI-Population Health RN Care Manager [phone]

## 2024-01-18 NOTE — Patient Instructions (Signed)
 Visit Information  Thank you for taking time to visit with me today. Please don't hesitate to contact me if I can be of assistance to you before our next scheduled telephone appointment.  Our next appointment is by telephone on 01/25/24 in the morning    Following is a copy of your care plan:   Goals Addressed             This Visit's Progress    VBCI Transitions of Care (TOC) Care Plan       Problems:  Recent Hospitalization for treatment of Atrial Fibrillation with Sotalol  initiation   Goal:  Over the next 30 days, the patient will not experience hospital readmission  Interventions:  Transitions of Care: Doctor Visits  - discussed the importance of doctor visits Arranged PCP follow-up within 7 days (Care Guide Scheduled) Update 01/10/24: Had PCP, Dr Romelle appt 9/23 HFU - since new Sotalol  - notices a bit more fatigue but is tolerating it - no med changes - Had 9/25 appt w/ Cardio, Leverne Charlies Helling, PA-C  VSS 184 lb 6.4  BMI 29.76  - discussed fatigue with cardio - will monitor and if she can't tolerate, she will call them otherwise see Dr Fonda Kitty 02/08/24 as planned - has hx of asthma states she has had some mild shortness of breath that resolved and states she also has asthma and knows when to seek treatment  and does not feel a need at this time - reviewed when to seek treatment and s/e with Sotolol patient denies chest pain- - states she has some mild nausea last night and this morning but this has resolved - states she had this in the hospital as well and it resolved and states she will call cardio if this continues  patient reports sugar 135 this morning Update 01/25/24: patient feels medicine is working well now with no further side effects;  reports sugar 129 - patient states she hasn't checked BP today but other days - 112/71  69; 113/70; 106/65 - has started walking with her neighbor to her mailbox and is wearing her diabetic shoes - patient states prior to  hospitalization she was going to the gym 3-4x/week -riding bike, weights and plans to start back to the gym states she feels her legs are not as strong since not going to the gym and plans to restart - educated to start slow and progress as tolerated and per MD   AFIB Interventions:   Counseled on increased risk of stroke due to Afib and benefits of anticoagulation for stroke prevention Reviewed importance of adherence to anticoagulant exactly as prescribed Counseled on bleeding risk associated with Eliquis  and importance of self-monitoring for signs/symptoms of bleeding Counseled on avoidance of NSAIDs due to increased bleeding risk with anticoagulants   Asthma: Advised patient to track and manage Asthma triggers Provided instruction about proper use of medications used for management of Asthma including inhalers Discussed the importance of adequate rest and management of fatigue with Asthma   Diabetes Interventions: Assessed patient's understanding of A1c goal: <7% Provided education to patient about basic DM disease process Reviewed medications with patient and discussed importance of medication adherence Discussed plans with patient for ongoing care management follow up and provided patient with direct contact information for care management team Assessed social determinant of health barriers Lab Results  Component Value Date   HGBA1C 7.9 (H) 12/27/2023    Patient Self Care Activities:  Attend all scheduled provider appointments Call pharmacy for medication  refills 3-7 days in advance of running out of medications Call provider office for new concerns or questions  Notify RN Care Manager of TOC call rescheduling needs Participate in Transition of Care Program/Attend TOC scheduled calls Take medications as prescribed   check feet daily for cuts, sores or redness enter blood sugar readings and medication or insulin  into daily log take the blood sugar log to all doctor  visits manage portion size wash and dry feet carefully every day wear comfortable, cotton socks wear comfortable, well-fitting shoes Contact MD with any issues including excessive fatigue, dizziness/lightheadedness, chest pain/palpitations  Plan:  Telephone follow up appointment with care management team member scheduled for:  01/25/24 in the AM The patient has been provided with contact information for the care management team and has been advised to call with any health related questions or concerns.         Patient verbalizes understanding of instructions and care plan provided today and agrees to view in MyChart. Active MyChart status and patient understanding of how to access instructions and care plan via MyChart confirmed with patient.     Telephone follow up appointment with care management team member scheduled for: 01/25/24 The patient has been provided with contact information for the care management team and has been advised to call with any health related questions or concerns.   Please call the care guide team at [phone] if you need to cancel or reschedule your appointment.   Please call the Suicide and Crisis Lifeline: 988 call 911 if you are experiencing a Mental Health or Behavioral Health Crisis or need someone to talk to.  Shona Prow RN, CCM [place]  VBCI-Population Health RN Care Manager [phone]

## 2024-01-24 ENCOUNTER — Other Ambulatory Visit (HOSPITAL_COMMUNITY): Payer: Self-pay

## 2024-01-25 ENCOUNTER — Telehealth: Payer: Self-pay

## 2024-01-25 NOTE — Patient Instructions (Signed)
 Visit Information  Thank you for taking time to visit with me today. Please don't hesitate to contact me if I can be of assistance to you before our next scheduled telephone appointment.  Our next appointment is by telephone on 02/01/24 in the morning   Following is a copy of your care plan:   Goals Addressed             This Visit's Progress    VBCI Transitions of Care (TOC) Care Plan       Problems:  Recent Hospitalization for treatment of Atrial Fibrillation with Sotalol  initiation   Goal:  Over the next 30 days, the patient will not experience hospital readmission  Interventions:  Transitions of Care: Doctor Visits  - discussed the importance of doctor visits Arranged PCP follow-up within 7 days (Care Guide Scheduled) Update 01/10/24: Had PCP, Dr Romelle appt 9/23 HFU - since new Sotalol  - notices a bit more fatigue but is tolerating it - no med changes - Had 9/25 appt w/ Cardio, Leverne Charlies Helling, PA-C  VSS 184 lb 6.4  BMI 29.76  - discussed fatigue with cardio - will monitor and if she can't tolerate, she will call them otherwise see Dr Fonda Kitty 02/08/24 as planned - has hx of asthma states she has had some mild shortness of breath that resolved and states she also has asthma and knows when to seek treatment  and does not feel a need at this time - reviewed when to seek treatment and s/e with Sotolol patient denies chest pain- - states she has some mild nausea last night and this morning but this has resolved - states she had this in the hospital as well and it resolved and states she will call cardio if this continues  patient reports sugar 135 this morning Update 01/18/24(previously marked as 01/25/24 in error) patient feels medicine is working well now with no further side effects;  reports sugar 129 - patient states she hasn't checked BP today but other days - 112/71  69; 113/70; 106/65 - has started walking with her neighbor to her mailbox and is wearing her diabetic shoes -  patient states prior to hospitalization she was going to the gym 3-4x/week -riding bike, weights and plans to start back to the gym states she feels her legs are not as strong since not going to the gym and plans to restart - educated to start slow and progress as tolerated and per MD Update: 01/25/24: Patient states she feels she is doing better and reports the weakness has improved, but hasn't gotten back to the gym yet. TOC RN advised patient to discuss exercise plan with cardiology and only do as tolerated. Patient reports her weight is stable and denies any respiration issues. Patient states she is working toward A1C goal of <7.0 and reports sugar this morning was 92. Discussed plan and patient would like to have TOC RN call again next week and then will decide if she wants to transfer to CCM program    AFIB Interventions:   Counseled on increased risk of stroke due to Afib and benefits of anticoagulation for stroke prevention Reviewed importance of adherence to anticoagulant exactly as prescribed Counseled on bleeding risk associated with Eliquis  and importance of self-monitoring for signs/symptoms of bleeding Counseled on avoidance of NSAIDs due to increased bleeding risk with anticoagulants   Asthma: Advised patient to track and manage Asthma triggers Provided instruction about proper use of medications used for management of Asthma including inhalers  Discussed the importance of adequate rest and management of fatigue with Asthma   Diabetes Interventions: Assessed patient's understanding of A1c goal: <7% Provided education to patient about basic DM disease process Reviewed medications with patient and discussed importance of medication adherence Discussed plans with patient for ongoing care management follow up and provided patient with direct contact information for care management team Assessed social determinant of health barriers Lab Results  Component Value Date   HGBA1C 7.9 (H)  12/27/2023    Patient Self Care Activities:  Attend all scheduled provider appointments Call pharmacy for medication refills 3-7 days in advance of running out of medications Call provider office for new concerns or questions  Notify RN Care Manager of TOC call rescheduling needs Participate in Transition of Care Program/Attend TOC scheduled calls Take medications as prescribed   check feet daily for cuts, sores or redness enter blood sugar readings and medication or insulin  into daily log take the blood sugar log to all doctor visits manage portion size wash and dry feet carefully every day wear comfortable, cotton socks wear comfortable, well-fitting shoes Contact MD with any issues including excessive fatigue, dizziness/lightheadedness, chest pain/palpitations  Plan:  Telephone follow up appointment with care management team member scheduled for:  02/01/24 in the AM  The patient has been provided with contact information for the care management team and has been advised to call with any health related questions or concerns.         Patient verbalizes understanding of instructions and care plan provided today and agrees to view in MyChart. Active MyChart status and patient understanding of how to access instructions and care plan via MyChart confirmed with patient.     Telephone follow up appointment with care management team member scheduled for: 02/01/24 The patient has been provided with contact information for the care management team and has been advised to call with any health related questions or concerns.   Please call the care guide team at [phone] if you need to cancel or reschedule your appointment.   Please call the Suicide and Crisis Lifeline: 988 call 911 if you are experiencing a Mental Health or Behavioral Health Crisis or need someone to talk to.  Shona Prow RN, CCM [place]  VBCI-Population Health RN Care Manager [phone]

## 2024-01-25 NOTE — Transitions of Care (Post Inpatient/ED Visit) (Signed)
 Transition of Care week 4  Visit Note  01/25/2024  Name: [name] MRN: [id number]          DOB: January 13, 1951  Situation: Patient enrolled in [facility] 30-day program. Visit completed with patient by telephone.   Background: Admit/Discharge Date   9/15 -  9/19 [name]   Primary Diagnosis: Atrial Fibrillation - Sotalol  initiation   Initial Transition Care Management Follow-up Telephone Call Discharge Date and Diagnosis: 12/31/23, Atrial Fibrillation -Sotalol  was initiated   Past Medical History:  Diagnosis Date   AICD (automatic cardioverter/defibrillator) present    Anemia    Anxiety    Arthritis    Arthritis of carpometacarpal (CMC) joint of thumb 03/26/2016   Asthma    has had multiple hospitalizations for this   Asthma, moderate persistent 12/10/2011   Atrial fibrillation (HCC)    ablation x 2 [facility], 01/2006, 2011.  on warfarin   Cardiac arrest (HCC) 04/2010   in hospital for pneumonia when this occured- occured at the hospital   CHF (congestive heart failure) (HCC) 2012   Echo 08/08/10 by [facility]. EF 35-45%. LV systolic function moderately reduced. Moderate global hypokinesis of LV.  RV systolic function moderately reduced. Mild MR. Trace AR.   Chronic diastolic heart failure (HCC) 01/07/2018   CKD (chronic kidney disease) stage 2, GFR 60-89 ml/min 02/22/2012   Her cr range 1.2-1.5 since Jan 2012 after hospitalization. 10/13 Estimated Creatinine Clearance: 69.3 ml/min (by C-G formula based on Cr of 1).     Complication of anesthesia    difficult time waking up after anesthesia   Depression    Diabetes mellitus    Type 2   Diabetic neuropathy (HCC) 09/18/2011   GASTROESOPHAGEAL REFLUX, NO ESOPHAGITIS 06/10/2006   Qualifier: Diagnosis of  By: Sharron Railing     GERD (gastroesophageal reflux disease) 03/23/2018   History of hiatal hernia    History of kidney stones    HYPERCHOLESTEROLEMIA 06/10/2006   LDL 93 at 01/2011 check-  Continue pravastatin  20mg  po  daily.  --discuss health modifications and possibly increaseing dose at next appt.  Cardiology- Dr little- stopped pravachol  on 05/21/11- 2/2 allergies?     Hyperlipidemia    Hypertension    ICD (St. Jude Protecta dual-chamber),secondary prevention (VF arrest) January 2012 11/19/2012   Iliotibial band syndrome, left 11/01/2018   Limb pain 06/04/2008   LLE, Baker's cyst in popliteal fossa, no DVT   Long term current use of anticoagulant therapy 08/21/2015   Non-ischemic cardiomyopathy (HCC)    echo 08/08/10 - EF 35-45% LV and RV systolic function mod reduced   Obesity (BMI 30-39.9) 01/04/2014   OSA on CPAP 06/10/2006   Sleep study 02/2014 : severe apnea, corrected with nasal pillows and 8cmh2o     Osteoarthritis of right knee 08/28/2008   Qualifier: Diagnosis of  By: Cleatrice MD, Ludie     Primary localized osteoarthritis of right knee    Sleep apnea    wears CPAP nightly   Type 2 diabetes mellitus with diabetic neuropathy, with long-term current use of insulin  (HCC) 06/10/2006              Ventricular tachycardia (HCC) 05/22/2015   Visit for monitoring Tikosyn  therapy 08/21/2015   Vocal cord disease     Assessment: Patient Reported Symptoms: Cognitive Cognitive Status: No symptoms reported, Normal speech and language skills, Alert and oriented to person, place, and time      Neurological Neurological Review of Symptoms: No symptoms reported  HEENT HEENT Symptoms Reported: No symptoms reported      Cardiovascular Cardiovascular Symptoms Reported: No symptoms reported Weight: 184 lb (83.5 kg)  Respiratory Respiratory Symptoms Reported: No symptoms reported Respiratory Management Strategies: CPAP, Medication therapy  Endocrine Endocrine Symptoms Reported: No symptoms reported Is patient diabetic?: Yes Is patient checking blood sugars at home?: Yes List most recent blood sugar readings, include date and time of day: patient reports sugar 92 this morning before eating. Endocrine  Self-Management Outcome: 4 (good) Endocrine Comment: Discussed goal of A1C and patient would like to be under 7.0 by end of year  Gastrointestinal Gastrointestinal Symptoms Reported: No symptoms reported      Genitourinary Genitourinary Symptoms Reported: No symptoms reported    Integumentary      Musculoskeletal Musculoskelatal Symptoms Reviewed: Weakness Additional Musculoskeletal Details: patient states the weakness has improved and still plans to increase her activity as tolerated Musculoskeletal Self-Management Outcome: 4 (good)      Psychosocial Psychosocial Symptoms Reported: No symptoms reported         There were no vitals filed for this visit.  Medications Reviewed Today     Reviewed by Lauro Shona LABOR, RN (Registered Nurse) on 01/25/24 at 1135  Med List Status: <None>   Medication Order Taking? Sig Documenting Provider Last Dose Status Informant  acetaminophen  (TYLENOL  8 HOUR) 650 MG CR tablet [id number] Yes Take 1 tablet (650 mg total) by mouth every 8 (eight) hours as needed for pain. Meccariello, Con PARAS, MD  Active Self, Pharmacy Records  apixaban  (ELIQUIS ) 5 MG TABS tablet [id number] Yes Take 1 tablet (5 mg total) by mouth 2 (two) times daily. Croitoru, Mihai, MD  Active Self, Pharmacy Records  bisoprolol  (ZEBETA ) 10 MG tablet [id number] Yes Take 1 tablet (10 mg total) by mouth in the morning and at bedtime. Croitoru, Mihai, MD  Active Self, Pharmacy Records  budesonide -formoterol  (SYMBICORT ) 80-4.5 MCG/ACT inhaler [id number] Yes INHALE 2 PUFFS INTO THE LUNGS IN THE MORNING AND AT BEDTIME Jude Harden GAILS, MD  Active Self, Pharmacy Records  buPROPion  (WELLBUTRIN  XL) 150 MG 24 hr tablet [id number] Yes Take 1 tablet (150 mg total) by mouth daily. Romelle Booty, MD  Active Self, Pharmacy Records  dapagliflozin  propanediol (FARXIGA ) 10 MG TABS tablet [id number] Yes Take 1 tablet (10 mg total) by mouth daily. Romelle Booty, MD  Active Self, Pharmacy Records  diclofenac  Sodium  (VOLTAREN ) 1 % GEL [id number] Yes Apply 1 Application topically daily as needed (pain). Leverne Charlies Helling, PA-C  Active   famotidine  (PEPCID ) 20 MG tablet [id number] Yes Take 1 tablet (20 mg total) by mouth daily. Romelle Booty, MD  Active Self, Pharmacy Records  fluticasone  (FLONASE ) 50 MCG/ACT nasal spray [id number] Yes Place 1 spray into both nostrils daily as needed for allergies. Leverne Charlies Helling, PA-C  Active   glucose blood test strip [id number] Yes Use as instructed Chrystal Lamarr RAMAN, MD  Active Self, Pharmacy Records  insulin  degludec (TRESIBA  FLEXTOUCH) 100 UNIT/ML FlexTouch Pen [id number] Yes Inject 40 Units into the skin daily. Romelle Booty, MD  Active Self, Pharmacy Records  Insulin  Pen Needle (BD PEN NEEDLE NANO U/F) 32G X 4 MM MISC [id number] Yes Use as directed with insulin  once daily Romelle Booty, MD  Active Self, Pharmacy Records  levalbuterol  (XOPENEX  HFA) 45 MCG/ACT inhaler [id number] Yes Inhale 1-2 puffs into the lungs every 4 (four) hours as needed for wheezing Jude Harden GAILS, MD  Active Self, Pharmacy Records  lidocaine  (XYLOCAINE ) 1 % (with pres) injection 3 mL [id number]  Romelle Booty, MD  Active   losartan  (COZAAR ) 25 MG tablet [id number] Yes Take 1 tablet (25 mg total) by mouth at bedtime. Romelle Booty, MD  Active Self, Pharmacy Records  montelukast  (SINGULAIR ) 10 MG tablet [id number] Yes Take 1 tablet (10 mg) by mouth at bedtime. Jude Harden GAILS, MD  Active Self, Pharmacy Records  potassium chloride  (KLOR-CON  M) 10 MEQ tablet [id number] Yes Take 1 tablet (10 mEq total) by mouth daily as directed Nellene Quita SAUNDERS, PA  Active Self, Pharmacy Records  PRODIGY LANCETS 28G MISC [id number] Yes 1 Units by Does not apply route 4 (four) times daily. Chrystal Lamarr RAMAN, MD  Active Self, Pharmacy Records  rosuvastatin  (CRESTOR ) 10 MG tablet [id number] Yes Take 1 tablet (10 mg total) by mouth at bedtime. Leverne Charlies Helling, PA-C  Active   Semaglutide , 2 MG/DOSE, (OZEMPIC , 2 MG/DOSE,) 8  MG/3ML SOPN [id number] Yes Inject 2 mg into the skin once a week. Romelle Booty, MD  Active Self, Pharmacy Records  sotalol  (BETAPACE ) 80 MG tablet [id number] Yes Take 2 tablets (160 mg total) by mouth at bedtime. Leverne Charlies Helling, PA-C  Active   torsemide  (DEMADEX ) 20 MG tablet [id number] Yes Take 1 tablet (20 mg total) by mouth daily. Croitoru, Mihai, MD  Active Self, Pharmacy Records            Recommendation:   Continue Current Plan of Care  Follow Up Plan:   Telephone follow up appointment date/time:  02/01/24 in the morning  Shona Prow RN, CCM [place]  VBCI-Population Health RN Care Manager [phone]

## 2024-02-01 ENCOUNTER — Telehealth: Payer: Self-pay

## 2024-02-01 NOTE — Transitions of Care (Post Inpatient/ED Visit) (Signed)
 Transition of Care Week #5 [facility] Program Closure  Visit Note  02/01/2024  Name: [name] MRN: [id number]          DOB: 04-17-1950  Situation: Patient enrolled in [facility] 30-day program. Visit completed with patient by telephone.   Background: Admit/Discharge Date   9/15 -  9/19 [name]   Primary Diagnosis: Atrial Fibrillation - Sotalol  initiation   Initial Transition Care Management Follow-up Telephone Call Discharge Date and Diagnosis: 12/31/23, Atrial Fibrillation -Sotalol  was initiated   Past Medical History:  Diagnosis Date   AICD (automatic cardioverter/defibrillator) present    Anemia    Anxiety    Arthritis    Arthritis of carpometacarpal (CMC) joint of thumb 03/26/2016   Asthma    has had multiple hospitalizations for this   Asthma, moderate persistent 12/10/2011   Atrial fibrillation (HCC)    ablation x 2 [facility], 01/2006, 2011.  on warfarin   Cardiac arrest (HCC) 04/2010   in hospital for pneumonia when this occured- occured at the hospital   CHF (congestive heart failure) (HCC) 2012   Echo 08/08/10 by [facility]. EF 35-45%. LV systolic function moderately reduced. Moderate global hypokinesis of LV.  RV systolic function moderately reduced. Mild MR. Trace AR.   Chronic diastolic heart failure (HCC) 01/07/2018   CKD (chronic kidney disease) stage 2, GFR 60-89 ml/min 02/22/2012   Her cr range 1.2-1.5 since Jan 2012 after hospitalization. 10/13 Estimated Creatinine Clearance: 69.3 ml/min (by C-G formula based on Cr of 1).     Complication of anesthesia    difficult time waking up after anesthesia   Depression    Diabetes mellitus    Type 2   Diabetic neuropathy (HCC) 09/18/2011   GASTROESOPHAGEAL REFLUX, NO ESOPHAGITIS 06/10/2006   Qualifier: Diagnosis of  By: Sharron Railing     GERD (gastroesophageal reflux disease) 03/23/2018   History of hiatal hernia    History of kidney stones    HYPERCHOLESTEROLEMIA 06/10/2006   LDL 93 at 01/2011 check-  Continue  pravastatin  20mg  po daily.  --discuss health modifications and possibly increaseing dose at next appt.  Cardiology- Dr little- stopped pravachol  on 05/21/11- 2/2 allergies?     Hyperlipidemia    Hypertension    ICD (St. Jude Protecta dual-chamber),secondary prevention (VF arrest) January 2012 11/19/2012   Iliotibial band syndrome, left 11/01/2018   Limb pain 06/04/2008   LLE, Baker's cyst in popliteal fossa, no DVT   Long term current use of anticoagulant therapy 08/21/2015   Non-ischemic cardiomyopathy (HCC)    echo 08/08/10 - EF 35-45% LV and RV systolic function mod reduced   Obesity (BMI 30-39.9) 01/04/2014   OSA on CPAP 06/10/2006   Sleep study 02/2014 : severe apnea, corrected with nasal pillows and 8cmh2o     Osteoarthritis of right knee 08/28/2008   Qualifier: Diagnosis of  By: Cleatrice MD, Ludie     Primary localized osteoarthritis of right knee    Sleep apnea    wears CPAP nightly   Type 2 diabetes mellitus with diabetic neuropathy, with long-term current use of insulin  (HCC) 06/10/2006              Ventricular tachycardia (HCC) 05/22/2015   Visit for monitoring Tikosyn  therapy 08/21/2015   Vocal cord disease     Assessment: Patient Reported Symptoms: Cognitive Cognitive Status: No symptoms reported, Normal speech and language skills, Alert and oriented to person, place, and time      Neurological Neurological Review of Symptoms: No  symptoms reported    HEENT HEENT Symptoms Reported: No symptoms reported      Cardiovascular   Cardiovascular Management Strategies: Medication therapy Weight: 183 lb (83 kg) Cardiovascular Self-Management Outcome: 4 (good)  Respiratory Respiratory Symptoms Reported: No symptoms reported Other Respiratory Symptoms: Patient feels breathing is okay now and feels breathing worsens around smoke or environmental allergies, perfumes - states she avoids cigarette smoke Respiratory Management Strategies: CPAP Respiratory Self-Management Outcome: 4  (good)  Endocrine Endocrine Symptoms Reported: No symptoms reported Is patient diabetic?: Yes Is patient checking blood sugars at home?: Yes List most recent blood sugar readings, include date and time of day: patient reports sugar was 109 this morning Endocrine Self-Management Outcome: 4 (good)  Gastrointestinal Gastrointestinal Symptoms Reported: No symptoms reported      Genitourinary Genitourinary Symptoms Reported: No symptoms reported    Integumentary Integumentary Symptoms Reported: No symptoms reported    Musculoskeletal Musculoskelatal Symptoms Reviewed: Weakness Other Musculoskeletal Symptoms: patient states she continues with some lower extremity weakness but states it is improving and is walking to mailbox easier now Musculoskeletal Self-Management Outcome: 4 (good)      Psychosocial Psychosocial Symptoms Reported: No symptoms reported         There were no vitals filed for this visit.  Medications Reviewed Today     Reviewed by Lauro Shona LABOR, RN (Registered Nurse) on 02/01/24 at 1105  Med List Status: <None>   Medication Order Taking? Sig Documenting Provider Last Dose Status Informant  acetaminophen  (TYLENOL  8 HOUR) 650 MG CR tablet [id number] Yes Take 1 tablet (650 mg total) by mouth every 8 (eight) hours as needed for pain. Meccariello, Con PARAS, MD  Active Self, Pharmacy Records  apixaban  (ELIQUIS ) 5 MG TABS tablet [id number] Yes Take 1 tablet (5 mg total) by mouth 2 (two) times daily. Croitoru, Mihai, MD  Active Self, Pharmacy Records  bisoprolol  (ZEBETA ) 10 MG tablet [id number] Yes Take 1 tablet (10 mg total) by mouth in the morning and at bedtime. Croitoru, Mihai, MD  Active Self, Pharmacy Records  budesonide -formoterol  (SYMBICORT ) 80-4.5 MCG/ACT inhaler [id number] Yes INHALE 2 PUFFS INTO THE LUNGS IN THE MORNING AND AT BEDTIME Jude Harden GAILS, MD  Active Self, Pharmacy Records  buPROPion  (WELLBUTRIN  XL) 150 MG 24 hr tablet [id number] Yes Take 1 tablet (150 mg  total) by mouth daily. Romelle Booty, MD  Active Self, Pharmacy Records  dapagliflozin  propanediol (FARXIGA ) 10 MG TABS tablet [id number] Yes Take 1 tablet (10 mg total) by mouth daily. Romelle Booty, MD  Active Self, Pharmacy Records  diclofenac  Sodium (VOLTAREN ) 1 % GEL [id number] Yes Apply 1 Application topically daily as needed (pain). Leverne Charlies Helling, PA-C  Active   famotidine  (PEPCID ) 20 MG tablet [id number] Yes Take 1 tablet (20 mg total) by mouth daily. Romelle Booty, MD  Active Self, Pharmacy Records  fluticasone  (FLONASE ) 50 MCG/ACT nasal spray [id number] Yes Place 1 spray into both nostrils daily as needed for allergies. Leverne Charlies Helling, PA-C  Active   glucose blood test strip [id number] Yes Use as instructed Chrystal Lamarr RAMAN, MD  Active Self, Pharmacy Records  insulin  degludec (TRESIBA  FLEXTOUCH) 100 UNIT/ML FlexTouch Pen [id number] Yes Inject 40 Units into the skin daily. Romelle Booty, MD  Active Self, Pharmacy Records  Insulin  Pen Needle (BD PEN NEEDLE NANO U/F) 32G X 4 MM MISC [id number] Yes Use as directed with insulin  once daily Romelle Booty, MD  Active Self, Pharmacy Records  levalbuterol  (XOPENEX  HFA) 45 MCG/ACT inhaler [id number] Yes Inhale 1-2 puffs into the  lungs every 4 (four) hours as needed for wheezing Jude Harden GAILS, MD  Active Self, Pharmacy Records  lidocaine  (XYLOCAINE ) 1 % (with pres) injection 3 mL [id number]   Romelle Booty, MD  Active   losartan  (COZAAR ) 25 MG tablet [id number] Yes Take 1 tablet (25 mg total) by mouth at bedtime. Romelle Booty, MD  Active Self, Pharmacy Records  montelukast  (SINGULAIR ) 10 MG tablet [id number] Yes Take 1 tablet (10 mg) by mouth at bedtime. Jude Harden GAILS, MD  Active Self, Pharmacy Records  potassium chloride  (KLOR-CON  M) 10 MEQ tablet [id number] Yes Take 1 tablet (10 mEq total) by mouth daily as directed Nellene Quita SAUNDERS, PA  Active Self, Pharmacy Records  PRODIGY LANCETS 28G MISC [id number] Yes 1 Units by Does not apply route 4 (four)  times daily. Chrystal Lamarr RAMAN, MD  Active Self, Pharmacy Records  rosuvastatin  (CRESTOR ) 10 MG tablet [id number] Yes Take 1 tablet (10 mg total) by mouth at bedtime. Leverne Charlies Helling, PA-C  Active   Semaglutide , 2 MG/DOSE, (OZEMPIC , 2 MG/DOSE,) 8 MG/3ML SOPN [id number] Yes Inject 2 mg into the skin once a week. Romelle Booty, MD  Active Self, Pharmacy Records  sotalol  (BETAPACE ) 80 MG tablet [id number] Yes Take 2 tablets (160 mg total) by mouth at bedtime. Leverne Charlies Helling, PA-C  Active   torsemide  (DEMADEX ) 20 MG tablet [id number] Yes Take 1 tablet (20 mg total) by mouth daily. Croitoru, Mihai, MD  Active Self, Pharmacy Records            Recommendation:   Patient will continue to follow up with providers   Follow Up Plan:   Closing From:  Transitions of Care Program  Shona Prow RN, CCM [facility]  VBCI-Population Health RN Care Manager [phone]

## 2024-02-07 ENCOUNTER — Ambulatory Visit: Attending: Cardiovascular Disease

## 2024-02-07 DIAGNOSIS — Z95810 Presence of automatic (implantable) cardiac defibrillator: Secondary | ICD-10-CM

## 2024-02-07 DIAGNOSIS — I5042 Chronic combined systolic (congestive) and diastolic (congestive) heart failure: Secondary | ICD-10-CM

## 2024-02-07 NOTE — Progress Notes (Signed)
 EPIC Encounter for ICM Monitoring  Patient Name: [name] is a 73 y.o. female Date: 02/07/2024 Primary Care Physican: Romelle Booty, MD Primary Cardiologist: Croitoru Electrophysiologist: Croitoru 02/26/2023 Weight: 182 lbs 05/24/2023 Weight: 184-185 lbs 06/23/2023 Weight: 184 lbs 07/30/2023 Weight: 182-184 lbs 10/29/2023 Weight: 181 lbs 12/03/2023 Weight: 182 lbs 12/20/2023 Office Weight: 188 lbs 01/03/2024 Weight: 184 lbs 02/07/2024 Weight: 182-183 lbs                 Since 06-Jan-2024 AT/AF  4 Time in AT/AF  0.2 hr/day (1.0%)  Longest AT/AF  2 hours                  Spoke with patient and heart failure questions reviewed.  Transmission results reviewed.  Pt asymptomatic for fluid accumulation.  Reports feeling well at this time and voices no complaints.     Since 01/03/2024 ICM Remote Transmission: Optivol thoracic impedance suggesting normal fluid levels.   Prescribed dosage:  Torsemide  20 mg 1 tablet by mouth daily.   Potassium 10 mEq 1 tablet by mouth by mouth daily.   Labs: 01/06/2024 Creatinine 1.46, BUN 19, Potassium 4.8, Sodium 141, GFR 38 12/31/2023 Creatinine 1.13, BUN 23, Potassium 4.4, Sodium 137, GFR 52  12/30/2023 Creatinine 1.25, BUN 21, Potassium 4.2, Sodium 137, GFR 46  12/29/2023 Creatinine 1.30, BUN 15, Potassium 4.2, Sodium 138, GFR 44  12/28/2023 Creatinine 1.23, BUN 16, Potassium 4.2, Sodium 137, GFR 47 10/26/2023 Creatinine 1.44, BUN 19, Potassium 4.4, Sodium 140, GFR 39, BNP 327 05/07/2023 Creatinine 1.27, BUN 19, Potassium 3.9, Sodium 136, GFR 45 A complete set of results can be found in Results Review.   Recommendations: No changes and encouraged to call if experiencing any fluid symptoms.    Follow-up plan: ICM clinic phone appointment on 03/13/2024.   91 day device clinic remote transmission 02/16/2024.     Next office visit scheduled:  02/08/2024 with Dr Kennyth.  Recall 07/07/2024 with Dr Francyne.     Copy of ICM check sent to Dr.  Francyne.  Remote monitoring is medically necessary for Heart Failure Management.    Daily Thoracic Impedance ICM trend: 11/08/2023 through 02/07/2024.    12-14 Month Thoracic Impedance ICM trend:     Diane GORMAN Garner, RN 02/07/2024 11:15 AM

## 2024-02-08 ENCOUNTER — Ambulatory Visit: Attending: Cardiology | Admitting: Cardiology

## 2024-02-08 ENCOUNTER — Other Ambulatory Visit (HOSPITAL_COMMUNITY): Payer: Self-pay

## 2024-02-08 ENCOUNTER — Other Ambulatory Visit: Payer: Self-pay | Admitting: Family Medicine

## 2024-02-08 ENCOUNTER — Encounter: Payer: Self-pay | Admitting: Cardiology

## 2024-02-08 ENCOUNTER — Other Ambulatory Visit: Payer: Self-pay

## 2024-02-08 VITALS — BP 132/88 | HR 80 | Ht 66.0 in | Wt 184.0 lb

## 2024-02-08 DIAGNOSIS — I472 Ventricular tachycardia, unspecified: Secondary | ICD-10-CM

## 2024-02-08 DIAGNOSIS — Z79899 Other long term (current) drug therapy: Secondary | ICD-10-CM | POA: Diagnosis not present

## 2024-02-08 DIAGNOSIS — I480 Paroxysmal atrial fibrillation: Secondary | ICD-10-CM | POA: Diagnosis not present

## 2024-02-08 DIAGNOSIS — I5022 Chronic systolic (congestive) heart failure: Secondary | ICD-10-CM

## 2024-02-08 DIAGNOSIS — R5383 Other fatigue: Secondary | ICD-10-CM

## 2024-02-08 DIAGNOSIS — Z95810 Presence of automatic (implantable) cardiac defibrillator: Secondary | ICD-10-CM

## 2024-02-08 DIAGNOSIS — D6869 Other thrombophilia: Secondary | ICD-10-CM

## 2024-02-08 LAB — CUP PACEART INCLINIC DEVICE CHECK
Date Time Interrogation Session: 20251028142035
Implantable Lead Connection Status: 753985
Implantable Lead Connection Status: 753985
Implantable Lead Implant Date: 20120120
Implantable Lead Implant Date: 20120120
Implantable Lead Location: 753859
Implantable Lead Location: 753860
Implantable Lead Model: 5076
Implantable Lead Model: 6935
Implantable Pulse Generator Implant Date: 20190618

## 2024-02-08 MED ORDER — BISOPROLOL FUMARATE 5 MG PO TABS
5.0000 mg | ORAL_TABLET | Freq: Two times a day (BID) | ORAL | 3 refills | Status: AC
  Filled 2024-02-08: qty 180, 90d supply, fill #0
  Filled 2024-05-03: qty 180, 90d supply, fill #1

## 2024-02-08 MED ORDER — OZEMPIC (2 MG/DOSE) 8 MG/3ML ~~LOC~~ SOPN
2.0000 mg | PEN_INJECTOR | SUBCUTANEOUS | 2 refills | Status: DC
  Filled 2024-02-08: qty 3, 28d supply, fill #0
  Filled 2024-03-04: qty 3, 28d supply, fill #1
  Filled 2024-03-31: qty 3, 28d supply, fill #2

## 2024-02-08 NOTE — Patient Instructions (Signed)
 Medication Instructions:  Your physician has recommended you make the following change in your medication:  1) DECREASE bisoprolol  to 5 mg twice daily   *If you need a refill on your cardiac medications before your next appointment, please call your pharmacy*  Follow-Up: At [facility], you and your health needs are our priority.  As part of our continuing mission to provide you with exceptional heart care, our providers are all part of one team.  This team includes your primary Cardiologist (physician) and Advanced Practice Providers or APPs (Physician Assistants and Nurse Practitioners) who all work together to provide you with the care you need, when you need it.  Your next appointment:   3 months  Provider:   Fonda Kitty, MD

## 2024-02-08 NOTE — Progress Notes (Unsigned)
 Electrophysiology Office Note:   Date:  02/11/2024  ID:  [name], DOB May 04, 1950, MRN [id number]  Primary Cardiologist: Jerel Balding, MD Electrophysiologist: Fonda Kitty, MD      History of Present Illness:   [name] is a 73 y.o. female with h/o ong-standing history of nonischemic cardiomyopathy, chronic heart failure with recovered LV systolic function and paroxysmal atrial fibrillation, s/p ICD ([employer], gen change 2019), no evidence of coronary artery disease by cardiac catheterization in 2012 who is being seen today for evaluation of VT at the request of Dr. Balding.  Discussed the use of AI scribe software for clinical note transcription with the patient, who gave verbal consent to proceed.  History of Present Illness [name] is a 73 year old female with atrial fibrillation and ventricular tachycardia who presents for post hospital follow up. She underwent hospital admission 9/15-9/19 for sotalol  loading.  She has a history of atrial fibrillation and ventricular tachycardia. Her episodes of atrial fibrillation last one to two hours, with no episodes longer than a couple of hours. Previously on Tikosyn , which was ineffective for her ventricular tachycardia, she was switched to sotalol .  She experiences persistent fatigue, with a noticeable decrease in activity levels at times. Occasional dizziness. She is currently taking bisoprolol  10 mg twice a day in addition to sotalol . No swelling in the legs or fluid retention is noted, and her breathing is reported as stable. Her fluid status is good, as confirmed by a recent call from a nurse. Her friend is encouraging her to be more active.    Review of systems complete and found to be negative unless listed in HPI.   EP Information / Studies Reviewed:    EKG is not ordered today. EKG from 01/06/24 reviewed which showed AP - VS with stable Qtc.      Echo 11/05/23:   1. Left ventricular ejection fraction, by estimation,  is 45 to 50%. The  left ventricle has mildly decreased function. The left ventricle  demonstrates regional wall motion abnormalities with basal inferior,  inferolateral, and anterolateral severe  hypokinesis. There is mild concentric left ventricular hypertrophy. Left  ventricular diastolic parameters are consistent with Grade I diastolic  dysfunction (impaired relaxation).   2. Right ventricular systolic function is normal. The right ventricular  size is normal. There is normal pulmonary artery systolic pressure. The  estimated right ventricular systolic pressure is 31.1 mmHg.   3. The mitral valve is normal in structure. Trivial mitral valve  regurgitation. No evidence of mitral stenosis.   4. The tricuspid valve is abnormal. Tricuspid valve regurgitation is  moderate.   5. The aortic valve is tricuspid. Aortic valve regurgitation is mild. No  aortic stenosis is present.   6. Aortic dilatation noted. There is borderline dilatation of the  ascending aorta, measuring 38 mm.   7. The inferior vena cava is normal in size with greater than 50%  respiratory variability, suggesting right atrial pressure of 3 mmHg.   Coronary CTA 11/12/23:  IMPRESSION: 1. Coronary calcium  score of 79. This was 72nd percentile for age, sex, and race matched control.   2. Normal coronary origin with right dominance.   3. CAD-RADS 2. Mild non-obstructive CAD (25-49%). Consider non-atherosclerotic causes of chest pain. Consider preventive therapy and risk factor modification.  Risk Assessment/Calculations:    CHA2DS2-VASc Score = 5   This indicates a 7.2% annual risk of stroke. The patient's score is based upon: CHF History: 1 HTN History: 1 Diabetes History:  1 Stroke History: 0 Vascular Disease History: 0 Age Score: 1 Gender Score: 1         Physical Exam:   VS:  BP 132/88 (BP Location: Right Arm, Patient Position: Sitting, Cuff Size: Large)   Pulse 80   Ht 5' 6 (1.676 m)   Wt 184 lb (83.5  kg)   LMP 07/26/2011   SpO2 98%   BMI 29.70 kg/m    Wt Readings from Last 3 Encounters:  02/08/24 184 lb (83.5 kg)  02/01/24 183 lb (83 kg)  01/25/24 184 lb (83.5 kg)     General: Well developed, in no acute distress.  Neck: No JVD.  Cardiac: Normal rate, regular rhythm.  Resp: Normal work of breathing.  Ext: No edema.  Neuro: No gross focal deficits.  Psych: Normal affect.   ASSESSMENT AND PLAN:    #S/p ICD:  - In clinic device interrogation was performed today.  Appropriate device function and stable lead parameters.  Presenting rhythm AP-VS. 4 AF episodes, longest 2 hrs and 26 minutes. No VT episodes.  - Continue remote monitoring.  #Ventricular tachycardia:  - Burden improved after transitioning to sotalol . We will decrease bisoprolol  due to fatigue.  - Continue to monitor with ICD.  #Chronic systolic heart failure: EF currently mildly reduced. Well compensated on exam.  - Continue GDMT regimen on dapagliflozin , losartan . We will decrease bisoprolol  due to fatigue.  - Close follow up with Dr. Francyne.   #Paroxysmal atrial fibrillation: Burden remains relatively low after transitioning from Tikosyn  to sotalol . #High risk medication use: Sotalol . Qtc ~48ms on last ECG. Cr 1.46 on 9/25. -Continue sotalol  160mg  daily. -Monitor burden with ICD.  #Hypercoagulable state due to AF: No bleeding issues. -Continue Eliquis  5mg  BID.   #Fatigue: Increased after starting sotalol , perhaps too much beta-blockade.  - Decrease bisoprolol .  - Take sotalol  at night.   Follow up with EP APP in 6 months  Signed, Fonda Kitty, MD

## 2024-02-12 ENCOUNTER — Ambulatory Visit: Payer: Self-pay | Admitting: Cardiology

## 2024-02-16 ENCOUNTER — Other Ambulatory Visit: Payer: Self-pay | Admitting: Family Medicine

## 2024-02-16 ENCOUNTER — Ambulatory Visit (INDEPENDENT_AMBULATORY_CARE_PROVIDER_SITE_OTHER)

## 2024-02-16 ENCOUNTER — Other Ambulatory Visit: Payer: Self-pay | Admitting: Cardiovascular Disease

## 2024-02-16 ENCOUNTER — Other Ambulatory Visit (HOSPITAL_COMMUNITY): Payer: Self-pay

## 2024-02-16 ENCOUNTER — Other Ambulatory Visit: Payer: Self-pay

## 2024-02-16 DIAGNOSIS — I472 Ventricular tachycardia, unspecified: Secondary | ICD-10-CM

## 2024-02-16 MED ORDER — EMBECTA PEN NEEDLE NANO 2 GEN 32G X 4 MM MISC
2 refills | Status: AC
  Filled 2024-02-16: qty 100, 90d supply, fill #0
  Filled 2024-05-03: qty 100, 90d supply, fill #1

## 2024-02-16 MED ORDER — BUPROPION HCL ER (XL) 150 MG PO TB24
150.0000 mg | ORAL_TABLET | Freq: Every day | ORAL | 3 refills | Status: AC
  Filled 2024-02-16: qty 90, 90d supply, fill #0
  Filled 2024-05-17: qty 90, 90d supply, fill #1

## 2024-02-17 ENCOUNTER — Other Ambulatory Visit: Payer: Self-pay

## 2024-02-17 ENCOUNTER — Ambulatory Visit: Payer: Self-pay | Admitting: Cardiology

## 2024-02-17 ENCOUNTER — Other Ambulatory Visit (HOSPITAL_COMMUNITY): Payer: Self-pay

## 2024-02-17 LAB — CUP PACEART REMOTE DEVICE CHECK
Battery Remaining Longevity: 27 mo
Battery Voltage: 2.92 V
Brady Statistic AP VP Percent: 0.19 %
Brady Statistic AP VS Percent: 97.26 %
Brady Statistic AS VP Percent: 0 %
Brady Statistic AS VS Percent: 2.55 %
Brady Statistic RA Percent Paced: 95.85 %
Brady Statistic RV Percent Paced: 0.22 %
Date Time Interrogation Session: 20251105012204
HighPow Impedance: 96 Ohm
Implantable Lead Connection Status: 753985
Implantable Lead Connection Status: 753985
Implantable Lead Implant Date: 20120120
Implantable Lead Implant Date: 20120120
Implantable Lead Location: 753859
Implantable Lead Location: 753860
Implantable Lead Model: 5076
Implantable Lead Model: 6935
Implantable Pulse Generator Implant Date: 20190618
Lead Channel Impedance Value: 342 Ohm
Lead Channel Impedance Value: 399 Ohm
Lead Channel Impedance Value: 456 Ohm
Lead Channel Pacing Threshold Amplitude: 0.5 V
Lead Channel Pacing Threshold Amplitude: 1 V
Lead Channel Pacing Threshold Pulse Width: 0.4 ms
Lead Channel Pacing Threshold Pulse Width: 0.4 ms
Lead Channel Sensing Intrinsic Amplitude: 1.625 mV
Lead Channel Sensing Intrinsic Amplitude: 1.625 mV
Lead Channel Sensing Intrinsic Amplitude: 12.125 mV
Lead Channel Sensing Intrinsic Amplitude: 12.125 mV
Lead Channel Setting Pacing Amplitude: 2 V
Lead Channel Setting Pacing Amplitude: 2.5 V
Lead Channel Setting Pacing Pulse Width: 0.4 ms
Lead Channel Setting Sensing Sensitivity: 0.3 mV
Zone Setting Status: 755011
Zone Setting Status: 755011

## 2024-02-17 MED ORDER — ROSUVASTATIN CALCIUM 10 MG PO TABS
10.0000 mg | ORAL_TABLET | Freq: Every day | ORAL | 3 refills | Status: AC
  Filled 2024-02-17: qty 90, 90d supply, fill #0
  Filled 2024-05-13: qty 90, 90d supply, fill #1

## 2024-02-17 NOTE — Progress Notes (Signed)
 Remote ICD Transmission

## 2024-02-18 ENCOUNTER — Other Ambulatory Visit: Payer: Self-pay | Admitting: Physician Assistant

## 2024-02-18 ENCOUNTER — Other Ambulatory Visit: Payer: Self-pay

## 2024-02-18 ENCOUNTER — Other Ambulatory Visit: Payer: Self-pay | Admitting: Family Medicine

## 2024-02-18 ENCOUNTER — Other Ambulatory Visit (HOSPITAL_COMMUNITY): Payer: Self-pay

## 2024-02-18 DIAGNOSIS — E1140 Type 2 diabetes mellitus with diabetic neuropathy, unspecified: Secondary | ICD-10-CM

## 2024-02-18 MED ORDER — SOTALOL HCL 80 MG PO TABS
160.0000 mg | ORAL_TABLET | Freq: Every day | ORAL | 3 refills | Status: AC
  Filled 2024-02-23: qty 180, 90d supply, fill #0
  Filled 2024-05-13: qty 180, 90d supply, fill #1

## 2024-02-19 ENCOUNTER — Other Ambulatory Visit (HOSPITAL_COMMUNITY): Payer: Self-pay

## 2024-02-19 MED ORDER — LOSARTAN POTASSIUM 25 MG PO TABS
25.0000 mg | ORAL_TABLET | Freq: Every day | ORAL | 0 refills | Status: DC
  Filled 2024-02-19: qty 90, 90d supply, fill #0

## 2024-02-21 ENCOUNTER — Other Ambulatory Visit: Payer: Self-pay

## 2024-02-23 ENCOUNTER — Other Ambulatory Visit (HOSPITAL_COMMUNITY): Payer: Self-pay

## 2024-02-23 ENCOUNTER — Other Ambulatory Visit: Payer: Self-pay

## 2024-03-04 ENCOUNTER — Other Ambulatory Visit (HOSPITAL_COMMUNITY): Payer: Self-pay

## 2024-03-10 ENCOUNTER — Other Ambulatory Visit (HOSPITAL_COMMUNITY): Payer: Self-pay

## 2024-03-13 ENCOUNTER — Ambulatory Visit: Attending: Cardiovascular Disease

## 2024-03-13 DIAGNOSIS — I5022 Chronic systolic (congestive) heart failure: Secondary | ICD-10-CM | POA: Diagnosis not present

## 2024-03-13 DIAGNOSIS — Z95810 Presence of automatic (implantable) cardiac defibrillator: Secondary | ICD-10-CM

## 2024-03-13 NOTE — Progress Notes (Signed)
 EPIC Encounter for ICM Monitoring  Patient Name: [name] is a 73 y.o. female Date: 03/13/2024 Primary Care Physican: Romelle Booty, MD Primary Cardiologist: Croitoru Electrophysiologist: Croitoru 02/26/2023 Weight: 182 lbs 05/24/2023 Weight: 184-185 lbs 06/23/2023 Weight: 184 lbs 07/30/2023 Weight: 182-184 lbs 10/29/2023 Weight: 181 lbs 12/03/2023 Weight: 182 lbs 12/20/2023 Office Weight: 188 lbs 01/03/2024 Weight: 184 lbs 02/07/2024 Weight: 182-183 lbs 03/13/2024 Weight: 182 lbs                 Since 16-Feb-2024 AT/AF  90 Time in AT/AF  2.8 hr/day (11.5%) Longest AT/AF  3 hours                 Spoke with patient and heart failure questions reviewed.  Transmission results reviewed.  Pt asymptomatic for fluid accumulation.  Reports feeling well at this time and voices no complaints.       Diet:  03/13/2024 She has been drinking more than 64 oz a day and had holiday foods from [employer].   Since 02/07/2024 ICM Remote Transmission: Optivol thoracic impedance suggesting intermittent days with possible fluid accumulation starting 02/26/2024.   Prescribed dosage:  Torsemide  20 mg 1 tablet by mouth daily.   Potassium 10 mEq 1 tablet by mouth by mouth daily.   Labs: 01/06/2024 Creatinine 1.46, BUN 19, Potassium 4.8, Sodium 141, GFR 38 12/31/2023 Creatinine 1.13, BUN 23, Potassium 4.4, Sodium 137, GFR 52  12/30/2023 Creatinine 1.25, BUN 21, Potassium 4.2, Sodium 137, GFR 46  12/29/2023 Creatinine 1.30, BUN 15, Potassium 4.2, Sodium 138, GFR 44  12/28/2023 Creatinine 1.23, BUN 16, Potassium 4.2, Sodium 137, GFR 47 10/26/2023 Creatinine 1.44, BUN 19, Potassium 4.4, Sodium 140, GFR 39, BNP 327 05/07/2023 Creatinine 1.27, BUN 19, Potassium 3.9, Sodium 136, GFR 45 A complete set of results can be found in Results Review.   Recommendations:  Recommendation to limit salt intake to 2000 mg daily and fluid intake to 64 oz daily.  Encouraged to call if experiencing any fluid  symptoms.    Follow-up plan: ICM clinic phone appointment on 04/24/2024.   91 day device clinic remote transmission 05/17/2024.     Next office visit scheduled:  05/31/2024 with Dr Kennyth.  Recall 07/07/2024 with Dr Francyne.     Copy of ICM check sent to Dr. Francyne.  Remote monitoring is medically necessary for Heart Failure Management.    Daily Thoracic Impedance ICM trend: 12/13/2023 through 03/13/2024.    12-14 Month Thoracic Impedance ICM trend:     [name], RN 03/13/2024 11:06 AM

## 2024-03-14 ENCOUNTER — Encounter: Payer: Self-pay | Admitting: Podiatry

## 2024-03-14 ENCOUNTER — Ambulatory Visit: Admitting: Podiatry

## 2024-03-14 DIAGNOSIS — Z0189 Encounter for other specified special examinations: Secondary | ICD-10-CM

## 2024-03-14 DIAGNOSIS — E119 Type 2 diabetes mellitus without complications: Secondary | ICD-10-CM

## 2024-03-14 DIAGNOSIS — M2042 Other hammer toe(s) (acquired), left foot: Secondary | ICD-10-CM | POA: Diagnosis not present

## 2024-03-14 DIAGNOSIS — M2041 Other hammer toe(s) (acquired), right foot: Secondary | ICD-10-CM | POA: Diagnosis not present

## 2024-03-14 DIAGNOSIS — M79609 Pain in unspecified limb: Secondary | ICD-10-CM

## 2024-03-14 DIAGNOSIS — Q828 Other specified congenital malformations of skin: Secondary | ICD-10-CM

## 2024-03-14 DIAGNOSIS — M2011 Hallux valgus (acquired), right foot: Secondary | ICD-10-CM | POA: Diagnosis not present

## 2024-03-14 DIAGNOSIS — B351 Tinea unguium: Secondary | ICD-10-CM

## 2024-03-14 DIAGNOSIS — M2012 Hallux valgus (acquired), left foot: Secondary | ICD-10-CM | POA: Diagnosis not present

## 2024-03-14 DIAGNOSIS — E1142 Type 2 diabetes mellitus with diabetic polyneuropathy: Secondary | ICD-10-CM | POA: Diagnosis not present

## 2024-03-17 NOTE — Progress Notes (Signed)
 Subjective:  Patient ID: [name], female    DOB: October 16, 1950,  MRN: [id number]  [name] presents to clinic today for for annual diabetic foot examination, at risk foot care with history of diabetic neuropathy, and painful porokeratotic lesion(s) b/l lower extremities and painful mycotic toenails that limit ambulation. Painful toenails interfere with ambulation. Aggravating factors include wearing enclosed shoe gear. Pain is relieved with periodic professional debridement. Painful porokeratotic lesions are aggravated when weightbearing with and without shoegear. Pain is relieved with periodic professional debridement.  She is accompanied by her youngest daughter on today's visit. Chief Complaint  Patient presents with   Nail Problem    Thick painful toenails, 6  month follow up    New problem(s): None.   PCP is Romelle Booty, MD.  Allergies  Allergen Reactions   Avelox [Moxifloxacin Hcl In Nacl] Other (See Comments)    Cardiac arrest per pt   Nsaids Other (See Comments)    Cardiac arrest per pt    Simvastatin Other (See Comments)    Muscle pain   Ace Inhibitors Other (See Comments) and Cough   Jardiance  [Empagliflozin ] Other (See Comments)    Felt crazy, fatigue, sweating, denies hypoglycemia while taking   Latex Rash    Review of Systems: Negative except as noted in the HPI.  Objective: No changes noted in today's physical examination. There were no vitals filed for this visit. [name] is a pleasant 73 y.o. female WD, WN in NAD. AAO x 3.   Diabetic foot exam was performed with the following findings:   Vascular Examination: CFT <3 seconds b/l. DP/PT pulses faintly palpable b/l. Digital hair absent.  Skin temperature gradient warm to warm b/l. No pain with calf compression. No ischemia or gangrene. No cyanosis or clubbing noted b/l. No edema noted b/l LE.   Neurological Examination: Sensation grossly intact b/l with 10 gram monofilament. Vibratory sensation  intact b/l. Pt has subjective symptoms of neuropathy.  Dermatological Examination: Pedal skin warm and supple b/l.   No open wounds. No interdigital macerations.  Toenails 1-5 b/l thick, discolored, elongated with subungual debris and pain on dorsal palpation.    Porokeratotic lesion(s) plantarmedial IPJ bilateral great toes, submet head 2 right foot, submet head 3 b/l, and 1st metatarsal head b/l feet. No erythema, no edema, no drainage, no fluctuance.  Musculoskeletal Examination: Muscle strength 5/5 to all lower extremity muscle groups bilaterally. No pain, crepitus or joint limitation noted with ROM b/l LE. Hammertoe(s) 2-5 b/l. HAV with bunion b/l. Patient ambulates with cane assistance.  Radiographs: None  Assessment/Plan: 1. Pain due to onychomycosis of nail   2. Porokeratosis   3. Hallux valgus, acquired, bilateral   4. Acquired hammertoes of both feet   5. Diabetic polyneuropathy associated with type 2 diabetes mellitus (HCC)   6. Encounter for diabetic foot exam (HCC)   Diabetic foot examination performed today.  All patient's and/or POA's questions/concerns addressed on today's visit. Mycotic toenails 1-5 b/l debrided in length and girth without incident. Porokeratotic lesion(s) plantar IPJ of left great toe, plantar IPJ of right great toe, submet head 2 right foot, submet head 3 b/l, and 1st metatarsal head b/l feet pared with sharp debridement without incident.Continue daily foot inspections and monitor blood glucose per PCP/Endocrinologist's recommendations. Continue soft, supportive shoe gear daily. Report any pedal injuries to medical professional. Call office if there are any questions/concerns. Return in about 3 months (around 06/12/2024).  Delon LITTIE Merlin, DPM  [facility] LOCATION: [address], [place] [address]                   Office [phone]   [facility] LOCATION: [address] [place], [place] [address] Office [phone]

## 2024-03-27 ENCOUNTER — Other Ambulatory Visit (HOSPITAL_BASED_OUTPATIENT_CLINIC_OR_DEPARTMENT_OTHER): Payer: Self-pay | Admitting: Pulmonary Disease

## 2024-03-27 ENCOUNTER — Other Ambulatory Visit (HOSPITAL_COMMUNITY): Payer: Self-pay

## 2024-03-27 ENCOUNTER — Other Ambulatory Visit: Payer: Self-pay

## 2024-03-27 MED ORDER — MONTELUKAST SODIUM 10 MG PO TABS
10.0000 mg | ORAL_TABLET | Freq: Every day | ORAL | 1 refills | Status: AC
  Filled 2024-03-27: qty 90, 90d supply, fill #0

## 2024-03-31 ENCOUNTER — Other Ambulatory Visit (HOSPITAL_COMMUNITY): Payer: Self-pay

## 2024-04-14 ENCOUNTER — Other Ambulatory Visit (HOSPITAL_COMMUNITY): Payer: Self-pay

## 2024-04-14 ENCOUNTER — Other Ambulatory Visit: Payer: Self-pay | Admitting: Family Medicine

## 2024-04-17 ENCOUNTER — Other Ambulatory Visit (HOSPITAL_COMMUNITY): Payer: Self-pay

## 2024-04-17 MED ORDER — TRESIBA FLEXTOUCH 100 UNIT/ML ~~LOC~~ SOPN
40.0000 [IU] | PEN_INJECTOR | Freq: Every day | SUBCUTANEOUS | 2 refills | Status: AC
  Filled 2024-04-17: qty 15, 37d supply, fill #0

## 2024-04-17 MED ORDER — FAMOTIDINE 20 MG PO TABS
20.0000 mg | ORAL_TABLET | Freq: Every day | ORAL | 3 refills | Status: AC
  Filled 2024-04-17: qty 90, 90d supply, fill #0

## 2024-04-18 ENCOUNTER — Other Ambulatory Visit: Payer: Self-pay

## 2024-04-19 ENCOUNTER — Other Ambulatory Visit: Payer: Self-pay

## 2024-04-19 ENCOUNTER — Other Ambulatory Visit (HOSPITAL_COMMUNITY): Payer: Self-pay

## 2024-04-24 ENCOUNTER — Ambulatory Visit: Attending: Cardiovascular Disease

## 2024-04-24 DIAGNOSIS — I5022 Chronic systolic (congestive) heart failure: Secondary | ICD-10-CM | POA: Diagnosis not present

## 2024-04-24 DIAGNOSIS — Z95810 Presence of automatic (implantable) cardiac defibrillator: Secondary | ICD-10-CM | POA: Diagnosis not present

## 2024-04-24 NOTE — Progress Notes (Signed)
 EPIC Encounter for ICM Monitoring  Patient Name: [name] is a 74 y.o. female Date: 04/24/2024 Primary Care Physican: Romelle Booty, MD Primary Cardiologist: Croitoru Electrophysiologist: Croitoru 02/26/2023 Weight: 182 lbs 05/24/2023 Weight: 184-185 lbs 06/23/2023 Weight: 184 lbs 07/30/2023 Weight: 182-184 lbs 10/29/2023 Weight: 181 lbs 12/03/2023 Weight: 182 lbs 12/20/2023 Office Weight: 188 lbs 01/03/2024 Weight: 184 lbs 02/07/2024 Weight: 182-183 lbs 03/13/2024 Weight: 182 lbs 04/24/2024 Weight: 182 lbs                 Since 13-Mar-2024 AT/AF 248 Time in AT/AF  2.1 hr/day (8.7%) Longest AT/AF  53 minutes               Spoke with patient and heart failure questions reviewed.  Transmission results reviewed.  Pt asymptomatic for fluid accumulation.  Reports feeling well at this time and voices no complaints.       Diet:  03/13/2024 She has been drinking more than 64 oz a day.    Since 03/13/2024 ICM Remote Transmission: Optivol thoracic impedance suggesting normal fluid levels with the exception of possible fluid accumulation starting 04/15/2024.   Prescribed dosage:  Torsemide  20 mg 1 tablet by mouth daily.   Potassium 10 mEq 1 tablet by mouth by mouth daily.   Labs: 01/06/2024 Creatinine 1.46, BUN 19, Potassium 4.8, Sodium 141, GFR 38 12/31/2023 Creatinine 1.13, BUN 23, Potassium 4.4, Sodium 137, GFR 52  12/30/2023 Creatinine 1.25, BUN 21, Potassium 4.2, Sodium 137, GFR 46  12/29/2023 Creatinine 1.30, BUN 15, Potassium 4.2, Sodium 138, GFR 44  12/28/2023 Creatinine 1.23, BUN 16, Potassium 4.2, Sodium 137, GFR 47 10/26/2023 Creatinine 1.44, BUN 19, Potassium 4.4, Sodium 140, GFR 39, BNP 327 05/07/2023 Creatinine 1.27, BUN 19, Potassium 3.9, Sodium 136, GFR 45 A complete set of results can be found in Results Review.   Recommendations:  Recommendation to limit salt intake to 2000 mg daily and fluid intake to 64 oz daily.  Encouraged to call if experiencing any fluid symptoms.     Follow-up plan: ICM clinic phone appointment on 05/01/2024 to recheck fluid levels.   91 day device clinic remote transmission 05/17/2024.     Next office visit scheduled:  05/31/2024 with Dr Kennyth.  Recall 07/07/2024 with Dr Francyne.     Copy of ICM check sent to Dr. Francyne.    Remote monitoring is medically necessary for Heart Failure Management.    Daily Thoracic Impedance ICM trend: 01/25/2024 through 04/24/2024.    12-14 Month Thoracic Impedance ICM trend:     [name], RN 04/24/2024 4:12 PM

## 2024-05-01 ENCOUNTER — Ambulatory Visit: Payer: Self-pay | Admitting: Family Medicine

## 2024-05-01 ENCOUNTER — Other Ambulatory Visit: Payer: Self-pay

## 2024-05-01 ENCOUNTER — Ambulatory Visit: Attending: Cardiovascular Disease

## 2024-05-01 ENCOUNTER — Encounter: Payer: Self-pay | Admitting: Family Medicine

## 2024-05-01 VITALS — BP 112/71 | HR 84 | Ht 66.0 in | Wt 180.0 lb

## 2024-05-01 DIAGNOSIS — I5022 Chronic systolic (congestive) heart failure: Secondary | ICD-10-CM

## 2024-05-01 DIAGNOSIS — R195 Other fecal abnormalities: Secondary | ICD-10-CM | POA: Diagnosis not present

## 2024-05-01 DIAGNOSIS — R197 Diarrhea, unspecified: Secondary | ICD-10-CM | POA: Diagnosis not present

## 2024-05-01 DIAGNOSIS — E1140 Type 2 diabetes mellitus with diabetic neuropathy, unspecified: Secondary | ICD-10-CM

## 2024-05-01 DIAGNOSIS — Z794 Long term (current) use of insulin: Secondary | ICD-10-CM

## 2024-05-01 DIAGNOSIS — Z95810 Presence of automatic (implantable) cardiac defibrillator: Secondary | ICD-10-CM

## 2024-05-01 LAB — POCT GLYCOSYLATED HEMOGLOBIN (HGB A1C): HbA1c, POC (controlled diabetic range): 8.8 % — AB (ref 0.0–7.0)

## 2024-05-01 MED ORDER — OZEMPIC (2 MG/DOSE) 8 MG/3ML ~~LOC~~ SOPN
2.0000 mg | PEN_INJECTOR | SUBCUTANEOUS | 2 refills | Status: AC
  Filled 2024-05-01: qty 3, 28d supply, fill #0

## 2024-05-01 MED ORDER — DEXCOM G7 SENSOR MISC
1 refills | Status: AC
  Filled 2024-05-01: qty 3, 30d supply, fill #0

## 2024-05-01 NOTE — Assessment & Plan Note (Signed)
 Uncontrolled, A1c 8.8 today increased from prior She has a fairly wide range of fasting blood glucose with no reported hypoglycemic events however I hesitate to adjust her insulin  regimen without more information.  I feel she would be a good candidate for CGM to monitor her glucose throughout the day. Sent Rx for Dexcom G7, appreciate Dr. Koval and pharmacy student Duwaine for patient instruction regarding this.  Plan for f/u with Dr. Koval in 1 week Continue ozempic , farxiga 

## 2024-05-01 NOTE — Patient Instructions (Addendum)
 I have sent in a Dexcom G7 sensor to your pharmacy. Please follow the instructions to place this.  Please exchange the sensor every 10 days.  I scheduled you to follow-up with Dr. Koval next Monday. He can review more details about the sensor and may want to follow up with you closely to determine your insulin  needs.  I have placed a referral to gastroenterology.  In the meantime, you can try over-the-counter Imodium (loperamide) to help with your diarrhea.  Please let our office know or seek medical attention urgently if you develop any worsening dark stools, blood in your stool, dizziness or lightheadedness or passing out  If any of your results from today are abnormal and/or require changes to your medical care, I will give you a call. Otherwise, I will send you a letter in the mail or a message on MyChart.

## 2024-05-01 NOTE — Progress Notes (Signed)
" ° ° °  SUBJECTIVE:   CHIEF COMPLAINT / HPI:   T2DM -Current medication regimen: tresiba  40u daily, farxiga  10mg  daily, ozempic  2mg  weekly. She is on losartan  and crestor . -Home CBGs: usually ranges 90s-150s, nothing >400 or <70 -Denies polyuria, polydipsia, chest pain, shortness of breath -Foot exam: UTD -Eye exam: UTD  Lab Results  Component Value Date   HGBA1C 8.8 (A) 05/01/2024   HGBA1C 7.9 (H) 12/27/2023   HGBA1C 8.4 (A) 12/10/2023    Loose stools x 1 mo Usually after eating Occasionally sees dark stools but no blood Denies dizziness, light headedness, syncope, palpitations Occasionally associated with abd cramping Has experienced weight loss but is also on ozempic  No vomiting, nausea; has not impacted appetite  PERTINENT  PMH / PSH: reviewed  OBJECTIVE:   BP (!) 140/84   Pulse 84   Ht 5' 6 (1.676 m)   Wt 180 lb (81.6 kg)   LMP 07/26/2011   SpO2 99%   BMI 29.05 kg/m   General: NAD, pleasant, able to participate in exam Respiratory: No respiratory distress Abd: soft NTND Skin: warm and dry, no rashes noted Psych: Normal affect and mood  ASSESSMENT/PLAN:   Assessment & Plan Type 2 diabetes mellitus with diabetic neuropathy, with long-term current use of insulin  (HCC) Uncontrolled, A1c 8.8 today increased from prior She has a fairly wide range of fasting blood glucose with no reported hypoglycemic events however I hesitate to adjust her insulin  regimen without more information.  I feel she would be a good candidate for CGM to monitor her glucose throughout the day. Sent Rx for Dexcom G7, appreciate Dr. Koval and pharmacy student Duwaine for patient instruction regarding this.  Plan for f/u with Dr. Koval in 1 week Continue ozempic , farxiga  Diarrhea, unspecified type Dark stools Ongoing x27mo No symptoms of acute anemia - check CBC today regardless given report of dark stools She had neg cologuard 2022 however given her symptoms I think it is appropriate for her  to see GI again and consider further workup In the meantime instructed to trial Imodium given that this could be gastrocolic reflex / IBS-D   Diane Coward, MD [facility] "

## 2024-05-02 ENCOUNTER — Other Ambulatory Visit: Payer: Self-pay

## 2024-05-02 ENCOUNTER — Ambulatory Visit: Payer: Self-pay | Admitting: Family Medicine

## 2024-05-02 LAB — CBC WITH DIFFERENTIAL/PLATELET
Basophils Absolute: 0 x10E3/uL (ref 0.0–0.2)
Basos: 1 %
EOS (ABSOLUTE): 0.2 x10E3/uL (ref 0.0–0.4)
Eos: 4 %
Hematocrit: 45.1 % (ref 34.0–46.6)
Hemoglobin: 14.5 g/dL (ref 11.1–15.9)
Immature Grans (Abs): 0 x10E3/uL (ref 0.0–0.1)
Immature Granulocytes: 0 %
Lymphocytes Absolute: 1.9 x10E3/uL (ref 0.7–3.1)
Lymphs: 39 %
MCH: 29.8 pg (ref 26.6–33.0)
MCHC: 32.2 g/dL (ref 31.5–35.7)
MCV: 93 fL (ref 79–97)
Monocytes Absolute: 0.5 x10E3/uL (ref 0.1–0.9)
Monocytes: 11 %
Neutrophils Absolute: 2.2 x10E3/uL (ref 1.4–7.0)
Neutrophils: 45 %
Platelets: 238 x10E3/uL (ref 150–450)
RBC: 4.87 x10E6/uL (ref 3.77–5.28)
RDW: 13.1 % (ref 11.7–15.4)
WBC: 4.8 x10E3/uL (ref 3.4–10.8)

## 2024-05-02 NOTE — Progress Notes (Signed)
 EPIC Encounter for ICM Monitoring  Patient Name: [name] is a 74 y.o. female Date: 05/02/2024 Primary Care Physican: Romelle Booty, MD Primary Cardiologist: Croitoru Electrophysiologist: Croitoru 02/26/2023 Weight: 182 lbs 05/24/2023 Weight: 184-185 lbs 06/23/2023 Weight: 184 lbs 07/30/2023 Weight: 182-184 lbs 10/29/2023 Weight: 181 lbs 12/03/2023 Weight: 182 lbs 12/20/2023 Office Weight: 188 lbs 01/03/2024 Weight: 184 lbs 02/07/2024 Weight: 182-183 lbs 03/13/2024 Weight: 182 lbs 04/24/2024 Weight: 182 lbs 05/02/2024 Weight: 180 lbs                 Clinical Status (24-Apr-2024 to 01-May-2024) AT/AF 5 Time in AT/AF  0.6 hr/day (2.4%)          Spoke with patient and heart failure questions reviewed.  Transmission results reviewed.  Pt asymptomatic for fluid accumulation.  Reports feeling well at this time and voices no complaints.       Diet:  03/13/2024 She has been drinking more than 64 oz a day.    Since 04/24/2024 ICM Remote Transmission: Optivol thoracic impedance suggesting fluid levels returned to normal   Prescribed dosage:  Torsemide  20 mg 1 tablet by mouth daily.   Potassium 10 mEq 1 tablet by mouth by mouth daily.   Labs: 01/06/2024 Creatinine 1.46, BUN 19, Potassium 4.8, Sodium 141, GFR 38 12/31/2023 Creatinine 1.13, BUN 23, Potassium 4.4, Sodium 137, GFR 52  12/30/2023 Creatinine 1.25, BUN 21, Potassium 4.2, Sodium 137, GFR 46  12/29/2023 Creatinine 1.30, BUN 15, Potassium 4.2, Sodium 138, GFR 44  12/28/2023 Creatinine 1.23, BUN 16, Potassium 4.2, Sodium 137, GFR 47 10/26/2023 Creatinine 1.44, BUN 19, Potassium 4.4, Sodium 140, GFR 39, BNP 327 05/07/2023 Creatinine 1.27, BUN 19, Potassium 3.9, Sodium 136, GFR 45 A complete set of results can be found in Results Review.   Recommendations:  Encouraged to call if experiencing any fluid symptoms.    Follow-up plan: ICM clinic phone appointment on 05/29/2024.   91 day device clinic remote transmission 05/17/2024.     Next  office visit scheduled:  05/31/2024 with Dr Kennyth.  Recall 07/07/2024 with Dr Francyne.     Copy of ICM check sent to Dr. Francyne.     Remote monitoring is medically necessary for Heart Failure Management.    Daily Thoracic Impedance ICM trend: 01/31/2024 through 05/01/2024.    12-14 Month Thoracic Impedance ICM trend:     [name], RN 05/02/2024 4:12 PM

## 2024-05-03 ENCOUNTER — Other Ambulatory Visit (HOSPITAL_COMMUNITY): Payer: Self-pay

## 2024-05-08 ENCOUNTER — Ambulatory Visit: Admitting: Pharmacist

## 2024-05-11 NOTE — Progress Notes (Signed)
 31 day ICM Remote transmission canceled due to [facility] clinic is on hold until further notice.  91 day remote monitoring will continue per protocol.

## 2024-05-13 ENCOUNTER — Other Ambulatory Visit: Payer: Self-pay | Admitting: Cardiovascular Disease

## 2024-05-13 ENCOUNTER — Other Ambulatory Visit: Payer: Self-pay | Admitting: Family Medicine

## 2024-05-13 ENCOUNTER — Other Ambulatory Visit (HOSPITAL_COMMUNITY): Payer: Self-pay

## 2024-05-13 DIAGNOSIS — I480 Paroxysmal atrial fibrillation: Secondary | ICD-10-CM

## 2024-05-13 DIAGNOSIS — E1140 Type 2 diabetes mellitus with diabetic neuropathy, unspecified: Secondary | ICD-10-CM

## 2024-05-15 ENCOUNTER — Other Ambulatory Visit: Payer: Self-pay

## 2024-05-15 ENCOUNTER — Other Ambulatory Visit (HOSPITAL_COMMUNITY): Payer: Self-pay

## 2024-05-15 MED ORDER — APIXABAN 5 MG PO TABS
5.0000 mg | ORAL_TABLET | Freq: Two times a day (BID) | ORAL | 1 refills | Status: AC
  Filled 2024-05-15 (×2): qty 180, 90d supply, fill #0

## 2024-05-16 ENCOUNTER — Other Ambulatory Visit (HOSPITAL_COMMUNITY): Payer: Self-pay

## 2024-05-16 MED ORDER — LOSARTAN POTASSIUM 25 MG PO TABS
25.0000 mg | ORAL_TABLET | Freq: Every day | ORAL | 0 refills | Status: AC
  Filled 2024-05-16: qty 90, 90d supply, fill #0

## 2024-05-17 ENCOUNTER — Other Ambulatory Visit (HOSPITAL_COMMUNITY): Payer: Self-pay

## 2024-05-17 ENCOUNTER — Other Ambulatory Visit: Payer: Self-pay

## 2024-05-17 ENCOUNTER — Ambulatory Visit

## 2024-05-18 LAB — CUP PACEART REMOTE DEVICE CHECK
Battery Remaining Longevity: 22 mo
Battery Voltage: 2.92 V
Brady Statistic AP VP Percent: 0.86 %
Brady Statistic AP VS Percent: 90.23 %
Brady Statistic AS VP Percent: 0.14 %
Brady Statistic AS VS Percent: 8.77 %
Brady Statistic RA Percent Paced: 83.5 %
Brady Statistic RV Percent Paced: 0.98 %
Date Time Interrogation Session: 20260204023323
HighPow Impedance: 73 Ohm
Implantable Lead Connection Status: 753985
Implantable Lead Connection Status: 753985
Implantable Lead Implant Date: 20120120
Implantable Lead Implant Date: 20120120
Implantable Lead Location: 753859
Implantable Lead Location: 753860
Implantable Lead Model: 5076
Implantable Lead Model: 6935
Implantable Pulse Generator Implant Date: 20190618
Lead Channel Impedance Value: 342 Ohm
Lead Channel Impedance Value: 399 Ohm
Lead Channel Impedance Value: 456 Ohm
Lead Channel Pacing Threshold Amplitude: 0.5 V
Lead Channel Pacing Threshold Amplitude: 0.875 V
Lead Channel Pacing Threshold Pulse Width: 0.4 ms
Lead Channel Pacing Threshold Pulse Width: 0.4 ms
Lead Channel Sensing Intrinsic Amplitude: 11.875 mV
Lead Channel Sensing Intrinsic Amplitude: 11.875 mV
Lead Channel Sensing Intrinsic Amplitude: 2.25 mV
Lead Channel Sensing Intrinsic Amplitude: 2.25 mV
Lead Channel Setting Pacing Amplitude: 2 V
Lead Channel Setting Pacing Amplitude: 2.5 V
Lead Channel Setting Pacing Pulse Width: 0.4 ms
Lead Channel Setting Sensing Sensitivity: 0.3 mV
Zone Setting Status: 755011
Zone Setting Status: 755011

## 2024-05-23 ENCOUNTER — Ambulatory Visit: Admitting: Pharmacist

## 2024-05-29 ENCOUNTER — Ambulatory Visit

## 2024-05-31 ENCOUNTER — Ambulatory Visit: Admitting: Cardiology

## 2024-06-27 ENCOUNTER — Ambulatory Visit: Admitting: Podiatry

## 2024-08-16 ENCOUNTER — Encounter

## 2024-11-15 ENCOUNTER — Encounter
